# Patient Record
Sex: Female | Born: 1960 | Race: White | Hispanic: No | Marital: Single
Health system: Northeastern US, Academic
[De-identification: ages and names within clinical notes are randomized; demographics above are authoritative.]

## PROBLEM LIST (undated history)

## (undated) ENCOUNTER — Encounter

---

## 2008-01-02 ENCOUNTER — Observation Stay
Admission: RE | Admit: 2008-01-02 | Discharge: 2008-01-07 | Disposition: A | Discharge status: Home / Self Care | Payer: No Typology Code available for payment source

## 2011-02-06 ENCOUNTER — Ambulatory Visit: Admit: 2011-02-06 | Payer: 59

## 2013-04-03 HISTORY — PX: HAND SURGERY: SHX662

## 2015-08-05 ENCOUNTER — Ambulatory Visit

## 2015-08-05 ENCOUNTER — Ambulatory Visit: Admitting: Internal Medicine

## 2015-08-05 ENCOUNTER — Ambulatory Visit: Admitting: Orthopaedic Surgery

## 2015-08-05 LAB — HX CHEM-LIPIDS
HX CHOL-HDL RATIO: 4.4
HX CHOLESTEROL: 286 mg/dL — ABNORMAL HIGH (ref 110–199)
HX HIGH DENSITY LIPOPROTEIN CHOL (HDL): 65 mg/dL (ref 35–75)
HX HOURS FAST: 4 h
HX LDL: 185 mg/dL — ABNORMAL HIGH (ref 0–129)
HX TRIGLYCERIDES: 179 mg/dL (ref 40–250)

## 2015-08-05 LAB — HX CHEM-PANELS
HX ANION GAP: 11 (ref 5–18)
HX BLOOD UREA NITROGEN: 18 mg/dL (ref 6–24)
HX CHLORIDE (CL): 100 meq/L (ref 98–110)
HX CO2: 24 meq/L (ref 20–30)
HX CREATININE (CR): 1.04 mg/dL (ref 0.57–1.30)
HX GFR, AFRICAN AMERICAN: 71 mL/min/{1.73_m2} — ABNORMAL LOW
HX GFR, NON-AFRICAN AMERICAN: 62 mL/min/{1.73_m2} — ABNORMAL LOW
HX GLUCOSE: 377 mg/dL — ABNORMAL HIGH (ref 70–139)
HX POTASSIUM (K): 4.9 meq/L (ref 3.6–5.1)
HX SODIUM (NA): 135 meq/L (ref 135–145)

## 2015-08-05 LAB — HX CHEM-LFT
HX ALANINE AMINOTRANSFERASE (ALT/SGPT): 118 IU/L — ABNORMAL HIGH (ref 0–54)
HX ALKALINE PHOSPHATASE (ALK): 272 IU/L — ABNORMAL HIGH (ref 40–130)
HX ASPARTATE AMINOTRANFERASE (AST/SGOT): 47 IU/L — ABNORMAL HIGH (ref 10–42)
HX BILIRUBIN, TOTAL: 0.9 mg/dL (ref 0.2–1.1)
HX GAMMA GLUTAMYL TRANSFERASE (GGT): 1417 IU/L — ABNORMAL HIGH (ref 0–59)

## 2015-08-05 LAB — HX CHEM-OTHER
HX CALCIUM (CA): 10.2 mg/dL (ref 8.5–10.5)
HX MAGNESIUM: 2 mg/dL (ref 1.6–2.6)
HX PHOSPHORUS: 4.2 mg/dL (ref 2.7–4.5)

## 2015-08-05 LAB — HX POINT OF CARE
HX GLUCOSE-POCT: 405 mg/dL — AB (ref 70–139)
HX HGB A1C, POC: 11.4 % — ABNORMAL HIGH (ref 4.3–5.8)

## 2015-08-05 LAB — HX BF-CHEM/URINE
HX ALBUMIN RANDOM URINE: 0.5 mg/dL
HX ALBUMIN/CREATININE RATIO, URINE: 13 mg/g (ref 0–30)
HX CREATININE, RANDOM URINE: 39.91 mg/dL
HX MICROALBUMIN CALC: 0.01 mg/mg

## 2015-08-05 LAB — HX CHOLESTEROL
HX CHOLESTEROL: 286 mg/dL — ABNORMAL HIGH (ref 110–199)
HX HIGH DENSITY LIPOPROTEIN CHOL (HDL): 65 mg/dL (ref 35–75)
HX LDL: 185 mg/dL — ABNORMAL HIGH (ref 0–129)
HX TRIGLYCERIDES: 179 mg/dL (ref 40–250)

## 2015-08-05 LAB — HX DIABETES: HX ALBUMIN RANDOM URINE: 0.5 mg/dL

## 2015-08-05 NOTE — Progress Notes (Signed)
* * *        [name]**    --- ---    55 Y old Female, DOB: 12-Nov-1960, External MRN: [id number]    Account Number: [id number]    [address] [name], [place]    Home: [phone]    Guarantor: [name] Insurance: [place] [place] Payer ID: PAPER    PCP: Laddie Aquas, MD Referring: Shellia Carwin External Visit ID: [id number]    Appointment [facility]        * * *    08/05/2015   **Appointment Provider:** Ancil Boozer, MD **CHN#:** [phone]    --- ---      **Supervising Provider:** Baron Hamper, MD    ---        Current Medications    ---    Taking     * Flovent HFA 44 MCG/ACT Aerosol 2 puffs Twice a day    ---    * Ibuprofen     ---    * Levothyroxine Sodium 50 MCG Tablet 1 tablet Once a day    ---    * Nystatin 100000 unit/gm Cream 1 application to affected area Twice a day    ---    * Omeprazole 40 mg Capsule Delayed Release 1 capsule Once a day    ---    * PredniSONE 10 mg Tablet 1 tablet Once a day    ---    * Voltaren 1 % Gel 2 grams twice daily    ---    * Voltaren 1 % Gel 2 grams twice daily    ---    * Medication List reviewed and reconciled with the patient    ---      Past Medical History    ---       Scleroderma - CREST dx 2007.        ---    IgG4 deficiency s/p IVIG 2010 ----single infusion given preventively after  week of bilateral knee replacements at [facility] 2010.        ---    Fx left wrist in 1994.        ---    Blood clot at LUE in 2006 on a short course of Coumadin.        ---    Bilateral carpal tunnel syndrome s/p Lt carpal tunnel release and steroids  injection right--.        ---    Bone spur left foot.        ---    Scoliosis.        ---    Spinal stenosis s/p steroids injection.        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 Katrinka Blazing 2010--- never infected, but packed with antibiotics with  surgery;.        ---    Right rotator cuff repairs x 4, complicated by repeat tears, infection,  placement of anchor  material.        ---      Surgical History    ---      Right rotator cuff repair, with infected hardware that had to be removed  2006    ---    Repeat right shoulder surgery, also which became infected. 2007    ---    Left rotator cuff repair 2008    ---    bilateral knee replacements 2009    ---    Left  arthroscopic carpal tunnel release 01/2011    ---    ORIF Left 4th metatarsal bone    ---    Left Ulna shortening 1994    ---      Family History    ---      Mother: deceased 71 yrs, lung cancer, hyperthyroidism, diagnosed with Cancer    ---    Father: deceased 25s yrs, heart attack, diagnosed with Heart Disease    ---    3 brother(s) .    ---    Non-Contributory    ---    FH of arthritis and scleroderma    fa dec with MI    Mo dec age 84 with lung    brother with scleroderma / copd;.    ---      Social History    ---    Tobacco history: Never smoked.    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    Alcohol Yes, but rarely.   Nonsmoker.    Lives with longstanding boyfriend.    ---      Allergies    ---      N.K.D.A.    ---    [name]]      Hospitalization/Major Diagnostic Procedure    ---      as above    ---      Review of Systems    ---     _ORT_ :    Eyes No. Ear, Nose Throat No. Digestion, Stomach, Bowel No. Bladder Problems  No. Bleeding Problems No. Numbness/Tingling No. Anxiety/Depression No.  Fever/Chills/Fatigue No. Chest Pain/Tightness/Palpitations No. Skin Rash No.  Dental Problems No. Joint/Muscle Pain/Cramps Yes. Blackout/Fainting No. Other  No.    Postive joint/muscle pain/cramps. Thirteen system review otherwise negative  and noncontributory. Denies numbness, tingling, or problems with eyes, ears,  nose throat, digestion, stomach, bowel, bladder, bleeding, fever, chills,  fatigue, chest pain/tightness, palpitations, skin rash, dentition or  blackout/fainting.        Reason for Appointment    ---      1\. Followup of a left Achilles rupture, treated nonoperatively DOI: 09/2014    ---       History of Present Illness    ---     _GENERAL_ :    Moselle is doing well 11 months s/p left achilles rupture. She is working with  PT and has noted improved walking endurance and strength. She has been  gardening and is planning on playing frisbee this weekend.      Vital Signs    ---    Pain scale 0, Ht-in 62, Ht-cm 157.48.      Examination    ---     _GENERAL_ :    Well appearing female in NAD. Left ankle ROM 30 deg dorsiflexion to 40 deg  plantarflexion. 4+/5 plantarflexion strength. NVID    Unable to single leg heel raise on left.          Assessments    ---    1\. Rupture Achilles tendon, left, subsequent encounter - S86.012D (Primary)    ---      Treatment    ---       **1\. Rupture Achilles tendon, left, subsequent encounter**    Notes: The patient was seen and examined with Dr. Everardo Beals today. She is doing  very well. The clinical findings were reviewed. She was advised to continue  her PT treatment and strengthening. Follow up PRN.    ---  Follow Up    ---    PRN    **Appointment Provider:** Ancil Boozer, MD    Electronically signed by Baron Hamper , MD on 08/10/2015 at 12:20 PM EDT    Sign off status: Completed        * * *        [facility]    [address]    [place], [place] [address]    Tel: [phone]    Fax: [phone]              * * *          Patient: WAHNETA, DEROCHER DOB: [birth year] Progress Note: Ancil Boozer, MD  08/05/2015    ---    Note generated by eClinicalWorks EMR/PM Software ([email or web address])

## 2015-08-05 NOTE — Progress Notes (Signed)
.  Progress Notes  .  Patient: [name], [name]  Provider: Ancil Boozer MD  .  DOB: [birth year] Age: 55 Y Sex: Female  Supervising Provider:: Baron Hamper, MD  Date: 08/05/2015  .  PCP: Laddie Aquas  MD  Date: 08/05/2015  .  --------------------------------------------------------------------------------  .  REASON FOR APPOINTMENT  .  1. Followup of a left Achilles rupture, treated nonoperatively  DOI: 09/2014  .  HISTORY OF PRESENT ILLNESS  .  GENERAL:   Darthy is doing well 11 months s/p left achilles rupture. She is  working with PT and has noted improved walking endurance and  strength. She has been gardening and is planning on playing  frisbee this weekend.  .  CURRENT MEDICATIONS  .  Taking Flovent HFA 44 MCG/ACT Aerosol 2 puffs Twice a day  Taking Ibuprofen  Taking Levothyroxine Sodium 50 MCG Tablet 1 tablet Once a day  Taking Nystatin 100000 unit/gm Cream 1 application to affected  area Twice a day  Taking Omeprazole 40 mg Capsule Delayed Release 1 capsule Once a  day  Taking PredniSONE 10 mg Tablet 1 tablet Once a day  Taking Voltaren 1 % Gel 2 grams twice daily  Taking Voltaren 1 % Gel 2 grams twice daily  Medication List reviewed and reconciled with the patient  .  PAST MEDICAL HISTORY  .  Scleroderma - CREST dx 2007  IgG4 deficiency s/p IVIG 2010 ----single infusion given  preventively after week of bilateral knee replacements at [facility]  2010  Fx left wrist in 1994  Blood clot at LUE in 2006 on a short course of Coumadin  Bilateral carpal tunnel syndrome s/p Lt carpal tunnel release and  steroids injection right--  Bone spur left foot  Scoliosis  Spinal stenosis s/p steroids injection  s/p bilateral knee replacements for valgus /arthritic  complications, performed by Dr. Katrinka Blazing 2010--- never infected, but  packed with antibiotics with surgery;  Right rotator cuff repairs x 4, complicated by repeat tears,  infection, placement of anchor material  .  ALLERGIES  .  N.K.D.A.  .  SURGICAL HISTORY  .  Right  rotator cuff repair, with infected hardware that had to be  removed 2006  Repeat right shoulder surgery, also which became infected. 2007  Left rotator cuff repair 2008  bilateral knee replacements 2009  Left arthroscopic carpal tunnel release 01/2011  ORIF Left 4th metatarsal bone  Left Ulna shortening 1994  .  FAMILY HISTORY  .  Mother: deceased 69 yrs, lung cancer, hyperthyroidism, diagnosed  with Cancer  Father: deceased 42s yrs, heart attack, diagnosed with Heart  Disease  3 brother(s) .  Non-Contributory  FH of arthritis and sclerodermafa dec with MIMo dec age 66 with  lung brother with scleroderma / copd;.  .  SOCIAL HISTORY  .  .  Tobacco  history: Never smoked.  .  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  .  Alcohol  Yes, but rarely.  .  Nonsmoker.Lives with longstanding boyfriend.  [name]  HOSPITALIZATION/MAJOR DIAGNOSTIC PROCEDURE  .  as above  .  REVIEW OF SYSTEMS  .  ORT:  .  Eyes    No . Ear, Nose Throat    No . Digestion, Stomach, Bowel     No . Bladder Problems    No . Bleeding Problems    No .  Numbness/Tingling    No . Anxiety/Depression    No .  Fever/Chills/Fatigue    No . Chest Pain/Tightness/Palpitations  No . Skin Rash    No . Dental Problems    No . Joint/Muscle  Pain/Cramps    Yes . Blackout/Fainting    No . Other    No .  .  Postive joint/muscle pain/cramps. Thirteen system review  otherwise negative and noncontributory. Denies numbness,  tingling, or problems with eyes, ears, nose throat, digestion,  stomach, bowel, bladder, bleeding, fever, chills, fatigue, chest  pain/tightness, palpitations, skin rash, dentition or  blackout/fainting.  [name]  VITAL SIGNS  .  Pain scale 0, Ht-in 62, Ht-cm 157.48.  [name]  EXAMINATION  .  GENERAL: Well appearing female in NAD. Left ankle  ROM 30 deg dorsiflexion to 40 deg plantarflexion. 4+/5  plantarflexion strength. NVIDUnable to single leg heel raise on  left.  .  ASSESSMENTS  .  Rupture Achilles tendon, left, subsequent encounter -  S86.012D  (Primary)  .  TREATMENT  .  Rupture Achilles tendon, left, subsequent encounter  Notes: The patient was seen and examined with Dr. Everardo Beals today.  She is doing very well. The clinical findings were reviewed. She  was advised to continue her PT treatment and strengthening.  Follow up PRN.  .  FOLLOW UP  .  PRN  .  [name]  Appointment Provider: Ancil Boozer, MD  .  Electronically signed by Baron Hamper , MD on  08/10/2015 at 12:20 PM EDT  .  CONFIRMATORY SIGN OFF  .  [name]  Document electronically signed by Ancil Boozer MD  .

## 2015-08-05 NOTE — Progress Notes (Signed)
General Medicine Visit - Resident note with preceptor addendum  Service Due by Standard Protocol Rules: DIAB EYE EX, HGBA1C, PATPORTALPIN.    [name]  Initial Screening   * [place]: Martie Round Raynelle Fanning)  Ht: 62.5 in.  Wt: 216.0 lbs.   BMI: 39.02  Temp: 98.0 deg F.     BP (Initial Leonard Screening): 126 / 86      BP (Rechecked, Actionable): 126 /   86 mmHg   HR: 111     Healthy Eating materials? Weight Education Handout for high BMI  Travel outside of the [place] in past 28 days:: No  Chronic Pain Assessment: Does pt experience chronic pain ? YES  Severity of pain? (max=10) 2  Smoking Assessment: Tobacco use? never smoker  .  Self Management materials provided? Printed handout: Keeping You Healthy  .  Med List: PRINTED by Desha for patient   Med List: PRINTED byfor patient   Patient consents for eRx History:  Paper  .  Falls Risk Assessment:   In the past year, have you ... Had no falls  Difficulty with balance? NO  Need assistance with ambulation while here? NO  Behavioral Health Tools:   PHQ2 Screening Score 0  .  Comments: .......................................Dimas Alexandria  Aug 05, 2015   1:53 PM  .  .  .  .  **Resident Note**  .  * Preceptor: Rachell Cipro Vernona Rieger)  CC: abd pain  .  HPI: 55F here for follow up of multiple issues.  .  Left achilles ruptured 11 months ago- still has not healed. Has been seeing   Ortho. Has been doing PT, and feels happy with it. No pain. Able to walk,   squat, bend etc. Feels happy with her level of mobility and has adjusted to   her situation.  .  She has had a worsening cholestatic pattern on LFTs for the last 2 yrs. She   sees her Rheumatologist [name] for scleroderma and arthritis. He refer  red her to Dr. Althea Charon (next week 5/10) before starting therapy for sclero  derma in case of medication effect on LFTs. Today she reports to me about r  ight sided abd sharp pain that started 1 month ago. Cannot pinpoint locatio  n. Intermittent. Daily, multiple times. Not associated with eating, bowel m  ovements.  No triggers. No worsening or relieving factors. This had happened   several yrs ago for a week at which time it self resolved. No N/V, F/C. Ha  s never had RUQ U/S  .  She was started on atorvastatin last time. She stopped taking it after 2 mo  nths because it caused myalgias.  .  Diabetes: Has had pre-diabetes before. She has never been on anti medicatio  ns for it. POC A1c 11.4 today, elevated from last time which was 7. Fingers  tick 400. Denies polyuria, polydipsia. Has lost 6 lbs since 11/2014 (intenti  onal weight loss).  .  She has profuse sweating for the last 5 months. No exacerbating/relieving f  actors. LMP was 4 yrs ago, so she is post-menopausal. Has not had TSH check  ed in over a year.  [name]  Has had leg cramps intermittently in the past 6 months. Another physician r  ecommended that she get her electrolytes checked so she is requesting this.  .  .  Past Medical History:  see problem list  .  .  .  .  .  .  Review of Systems:  ROS otherwise negative except as noted in HPI  .  [name]  PROBLEMS:   PAST MEDICAL HISTORY (prior to today's visit):  FUNGAL INFECTION (ICD-117.9) (ICD10-B49)      FATTY LIVER DISEASE (ICD-571.8) (ICD10-K76.0)  ? 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)  CARPAL TUNNEL (ICD-354.0) (ICD10-G56.00)  OSTEOARTHRITIS (ICD-715.09) (ICD10-M15.9)  S/P ROTATOR CUFF SURGERY 12/2004 - RT SHOULDER; 2008 LT (ICD-V45.89)  Family Hx of MELANOMA, FAMILY HX  (ICD-V16.8) (ICD10-Z80.8)  FRACTURE OF OTHER SPEC SITE,  PATHOLOGIC - MULTIPLE (ICD-733.19)  ONYCHOMYCOSIS (ICD-110.1) (ICD10-B35.1)  CHOLECYSTECTOMY AND HERNIA REPAIR (ICD-V45.89)  VITAMIN D DEFICIENCY (ICD-268.9) (ICD10-E55.9)  ANNUAL EXAM (ICD-V72.31) (ICD10-Z00.00)  VACCINATION WITH TDAP (ICD-V06.8) (ZOX09-U04)  COLONOSCOPY (ICD-V76.51) (ICD10-Z01.89)  PROBLEM CHANGES:  Changed problem from DIABETES TYPE 2 - DX BY A1C DIET CONTROL (ICD-250.00)   (ICD10-E11.9) to DIABETES TYPE 2 - DX BY A1C (ICD-250.00) (ICD10-E11.9) - S  igned  Removed problem of ANNUAL EXAM (ICD-V72.31) (ICD10-Z00.00) - Signed  Removed problem of FUNGAL INFECTION (ICD-117.9) (ICD10-B49) - Signed  Changed problem from DIABETES TYPE 2 - DX BY A1C (ICD-250.00) (ICD10-E11.9)   to DIABETES MELLITUS, TYPE II, UNCONTROLLED (ICD-250.02) (ICD10-E11.65) -   Signed  Added new problem of OBESITY (ICD-278.00) (ICD10-E66.9) - Signed  Removed problem of SKIN INFECTION (ICD-686.9) (ICD10-L08.9) - Signed  Removed problem of ONYCHOMYCOSIS (ICD-110.1) (ICD10-B35.1) - Signed  Added new problem of TRANSAMINASES, SERUM, ELEVATED (ICD-790.4) (ICD10-R74.  0)  .  MEDICATIONS:   PAST MEDICINES (prior to today's visit):  LEVOTHYROXINE SODIUM 25 MCG TABS (LEVOTHYROXINE SODIUM) Take one tablet by   mouth once daily  OMEPRAZOLE 20 MG CPDR (OMEPRAZOLE) Take one capsule by mouth twice a day  ULTRAM 50 MG TABS (TRAMADOL HCL) take two  by mouth every 8 hours as needed   for pain  OXYCODONE HCL 5 MG TABS (OXYCODONE HCL) Please take one tablet orally every   8 hours as needed for severe pain  IBUPROFEN 600 MG TABS (IBUPROFEN) 1 tab by mouth TID as needed for pain  BACLOFEN 10 MG TABS (BACLOFEN) Please take half to one tablet as needed for   back spasms  ATORVASTATIN CALCIUM 20 MG TABS (ATORVASTATIN CALCIUM) Take one tablet by m  outh once daily  PROAIR HFA 108 (90 BASE) MCG/ACT AERS (ALBUTEROL SULFATE) Inhale 2 puffs 4   times a day as needed for wheezing  LOTRISONE 1-0.05 % CREA  (CLOTRIMAZOLE-BETAMETHASONE) apply to affected area   on ankle twice a day  .  MEDICINE CHANGES:  Removed medication of ATORVASTATIN CALCIUM 20 MG TABS (ATORVASTATIN CALCIUM  ) Take one tablet by mouth once daily - Signed  Added new medication of METFORMIN HCL 500 MG TABS (METFORMIN HCL) take one   tablet by mouth twice a day - Signed  Added new medication of FREESTYLE FREEDOM LITE W/DEVICE KIT (BLOOD GLUCOSE   MONITORING SUPPL) - Signed  Added new medication of FREESTYLE LITE TEST STRP (GLUCOSE BLOOD) - Signed  Added new medication of FREESTYLE LANCETS MISC (LANCETS) - Signed  Changed medication from FREESTYLE LITE TEST STRP (GLUCOSE BLOOD) to [facility]  LE LITE TEST STRP (GLUCOSE BLOOD) check fingerstick three times a day (ICD   10 E11.65) - Signed  Changed medication from FREESTYLE LANCETS MISC (LANCETS) to FREESTYLE LANCE  TS MISC (LANCETS) check fingerstick three times a day (ICD 10 E11.65) - Sig  ned  Changed medication from FREESTYLE FREEDOM LITE W/DEVICE KIT (BLOOD GLUCOSE   MONITORING SUPPL) to FREESTYLE FREEDOM LITE W/DEVICE KIT (BLOOD GLUCOSE MON  ITORING SUPPL) use as directed (ICD 10 E11.65) - Signed  Added new medication of PREDNISONE 10 MG TABS (PREDNISONE) TK 1 T PO  ONCE   Daily - Signed  Added new medication of DICLOFENAC SODIUM 1 % GEL (DICLOFENAC SODIUM) APP 2   GRAMS EXT AA BID - Signed  Removed medication of OXYCODONE HCL 5 MG TABS (OXYCODONE HCL) Please take o  ne tablet orally every 8 hours as needed for severe pain  Removed medication of ULTRAM 50 MG TABS (TRAMADOL HCL) take two  by mouth e  very 8 hours as needed for pain  Rx of METFORMIN HCL 500 MG TABS (METFORMIN HCL) take one tablet by mouth tw  ice a day;  #60[Tablet] x 12;  Signed;  Entered by: [name], MD (JR 5)  ;  Authorized by: [name], MD (JR 5);  Method used: Electronically to   [facility] [id number]*, [address] [place], [place]  [id number], Ph: [phone], Fax: [phone]  Rx of FREESTYLE LITE TEST STRP (GLUCOSE  BLOOD) check fingerstick three time  s a day (ICD 10 E11.65);  #90[Strip] x 3;  Signed;  Entered by: Jenne Pane  an, MD;  Authorized by: [name], MD (JR 5);  Method used: Electronical  ly to [facility] [id number]*, [address], [place], [place]  [address], Ph:   [phone], Fax: [phone]  Rx of FREESTYLE LANCETS MISC (LANCETS) check fingerstick three times a day   (ICD 10 E11.65);  #90[Lancet] x 3;  Signed;  Entered by: Canary Brim, MD;    Authorized by: [name], MD (JR 5);  Method used: Electronically to [facility] [id number]*, [address] [place], [place]  [id number], Ph: [phone]  [phone], Fax: [phone]  Rx of FREESTYLE FREEDOM LITE W/DEVICE KIT (BLOOD GLUCOSE MONITORING SUPPL)   use as directed (ICD 10 E11.65);  #1[Device] x 0;  Signed;  Entered by: Clydie Braun, MD;  Authorized by: [name], MD (JR 5);  Method used: Elec  tronically to [facility] [id number]*, [address], [place], [place]  [address], Ph: [phone], Fax: [phone]  .  ALLERGIES:   No Known Allergies  .  DIRECTIVES:   .  Vitals:   BP: 126 / 86 mmHg Ht: 62.5 in.  Wt: 216.0 lbs.  BMI (in-lb) 39.02  Temp: 98.0deg F.   Pulse Rate: 111 bpm  .  .  Additional PE: Gen: NAD, mild diaphoresis  Left ankle and foot: non tender. Warm, well perfused. Absence of tendon pal  pated over achilles area. Mild hyperdorsiflexion of left foot. Gait WNL.  Abd: soft, obese abdomen with redundant skin. NT on palpation. ND. She note  s pain is usually between RLQ and RUQ. No massess noted. No hepatosplenomeg  aly.  .  .  Assessment /T/ Plan:   46F here for follow up of multiple issues.  [name]  #Left achilles rupture: continue PT, light exercise  .  #Worsening LFTs, GGT, intermittent abdominal cramping:  -Will see Dr. Althea Charon on 5/10  -repeat LFTs,  GGT today  -Defer RUQ U/S or other imaging until she see Dr. Althea Charon  .  #Diabetes:  A1c 11.4 today, FS 405.  -Start metformin 500 BID  -Prescribed glucometer and test strips. She will bring these next week  to c  lin[facility] on 5/10 for diabetes teaching.   -urine microalbumin, urine ketones today  -lipid panel today  -check serum Cr, BUN, GFR  -Currently off statin due to side effect (atorvastatin --> myalgias). Will   not try another one right now given LFTs. But at next visit, will discuss t  rying another statin.   .  #Diaphoresis: has hx of hypothyroidism on stable levothyroxine .   -check TSH  reflex today, may need dose adjusted if overcorrected  .  #Leg cramps: check chem10  .  Needs colonoscopy in the fall  RTC on Wed 5/10 for full physical  .  .  .  * Resident Signature (.sign): .......................................[name], MD (JR 5)  Aug 05, 2015 4:14 PM  .  .  .  ORDERS:  POC A1C test (done in office) [phone]) [62130865]  RTC in 2 weeks [RTC-014]  LYTES [CPT-82495]  SGPT (ALT) [Q17426R,L001545]  Alkaline Phosphatase (ALK) [CPT-84075]  Bilirubin, Total [CPT-82247]  SGOT (AST) [Q19208W,L001123]  Hemoglobin A1C (Glycohemoglobin) [CPT-83036]  Glucose  [CPT-82947]  Magnesium [Q26013E,L001537]  Calcium (CA) [CPT-82310]  Phosphorus [Q718X,L001024]  TSH Reflex [CPT-00000]  Thyroid Stimulating Hormone (TSH) [CPT-84443]  T3 Total (T3) [CPT-84480]  Thyroxine (T4) [CPT-84436]  GGT [CPT-82977]  Lipid Profile-Chol, HDL, Trig, LDL(calc) [CPT-80061]  Creatinine (CR) [CPT-82565]  Blood Urea Nitrogen (BUN) [CPT-84520]  Microalbumin urine [Q6517X,L140285]  Urine w/microscope (81000) - done [78469629]  Urine dip - requested [CPT-99999]  Est Level 4 (1121/NP2292) [BMW-41324]  .  [name]  **Preceptor Note**  Resident: Marlane Hatcher ([place])  Problem Follow-up  .  [name]  Subjective   Patient is a 55 Years Old Female who presents to clinic f/u multiple medica  l problems   I discussed the patient with Dr. Marlane Hatcher [facility]) in a routine precepting enc  ounter.  I also personally interviewed and examined the patient.  I agree w  ith the patient's diagnosis and management.   .  .  Objective   see resident's note for details.    .  Assessment   55  y/o woman with PMH unexplained transaminitis and elevated ggt, inflammat  ory arthritis, CREST and diabetes who presents to f/u multiple medical prob  lems.  Main issue today is that she now has uncontrolled diabetes with HgbA  1c 11.4.  .  Plan   1. Diabetes:  -will start Metformin 500 mg 2x/day  -will come back next week for diabetes teaching with NP, sent glucometer an  d supplies to her pharmacy and told her to bring with her next week to appo  intment with NP  -will check lipids, GFR and urine microalbumin  -will hold off on restarting statin until patient sees Dr. Althea Charon and get  s her opinion  .  2. Transaminitis and elevated GGT with intermittent abdominal cramping:   -will recheck LFTs and ggt today  -has appt to see Dr. Althea Charon 08/11/15  .  RTC for annual PE 08/11/15.  .  ......................................Marland KitchenCanary Brim, MD  Aug 06, 2015 4:17   PM  See resident note for details, Follow-up per resident note, Return visit sc  heduled  .  .  .  .  Patient Care Plan  .  .  .  .  .  [name]  Vaccine Info Sheets ]  .  [name]  Electronically Signed by Canary Brim, MD on 08/06/2015 at 4:18 PM  ________________________________________________________________________

## 2015-08-06 LAB — HX CHEM-METABOLIC
HX HEMOGLOBIN A1C: 11 % — ABNORMAL HIGH (ref 4.3–5.8)
HX T3, TOTAL: 77 ng/dL (ref 58–159)
HX T4, TOTAL (THYROXINE): 5.8 ug/dL (ref 4.5–10.9)
HX THYROID STIMULATING HORMONE (TSH): 1.1 u[IU]/mL (ref 0.35–4.94)

## 2015-08-06 LAB — HX DIABETES
HX GLUCOSE: 377 mg/dL — ABNORMAL HIGH (ref 70–139)
HX HEMOGLOBIN A1C: 11 % — ABNORMAL HIGH (ref 4.3–5.8)

## 2015-08-07 ENCOUNTER — Ambulatory Visit

## 2015-08-07 NOTE — Progress Notes (Signed)
[facility] Aug 07, 2015  [address]   [place]  [place] [address]  Main: [phone]  Fax: [phone]  Patient Portal: [email or web address]              [name]  [address]  [place], [place]  [address]          MR#: [id number]          DOB: 03-Mar-1961    Dear  [name],    The results of the tests performed during your visit are as follows:  Cholesterol Tests   Cholesterol 286 Normal = 110-199 08/05/2015   HDL (good cholesterol) 65 Normal > 40 08/05/2015   Triglyceride 179 Normal = 40-250 08/05/2015   LDL (bad cholesterol) 185 Normal < 190  (If you are on a cholesterol medicine, the goal is to lower this by at least 50% from your baseline) 08/05/2015   Your risk of heart attack or stroke in the next 10 years is % If > 7.5%, then consider starting a type of medicine called "statin"     Calculate your own risk here:  http://tools.BuyDucts.dk   For more information, visit   http://tools.TransportationAnalyst.gl    Diabetes Tests   Glucose random 377 Normal = 60-150 08/05/2015   HgbA1c 11.0 Normal = 4.3-5.8 08/05/2015       Liver Tests   ALT 118 Normal = 0-54 08/05/2015   AST 47 Normal = 10-42 08/05/2015     Thyroid Test   TSH 1.10 Normal = 0.35-4.94 08/05/2015   T3 77 Normal = 58-159 08/05/2015   T4 5.8 Normal =4.5-10.9 08/05/2015       Kidney Tests   BUN 18 Normal = 6-24 08/05/2015   Creatinine 1.04 Normal = 0.57-1.30 08/05/2015   SODIUM = 135 MEQ/L     (08/05/2015)     (Normal = 135-145)  POTASSIUM = 4.9 MEQ/L     ( 08/05/2015)     (Normal = 3.6-5.1)  BICARB = 24 MEQ/L     (08/05/2015)     (Normal = 20-30)  CHLORIDE = 100 MEQ/L     (08/05/2015)     (Normal = 98-110)        Other Tests   Bilirubin, Total 0.9 Normal = 0.2-1.1 08/05/2015   Calcium 10.2 Normal =8.5-10.5 08/05/2015   GGT (H) 1417 Normal = 0-59 08/05/2015   ALK (H) 272 Normal =40-130 08/05/2015   Magnesium 2.0 Normal = 1.6-2.6 08/05/2015   Phosphorus 4.2 Normal = 2.7-4.5  08/05/2015     Microalbumin, Ur: 0.01(which means this was at a very low level in your urine. This is a good thing. In diabetes, the urine may show higher level of microalbumin, which is a sign of kidney damage from the diabetes).     As expected, your liver tests are still elevated. We will discuss this on Wednesday May 10th at your appointment.    Sincerely,       Glee Arvin, MD  [facility]  [phone]    ** Please be aware of our new extended hours to better care for you. Hours are: Monday through Thursday from 8 am to 8 pm; Friday from 8 am to 5 pm; Saturday from 8 am to 12 noon.        Created By Jamse Mead on 08/07/2015 at 07:52 PM    Electronically Signed By [name], MD on 08/09/2015 at 10:02 AM

## 2015-08-11 ENCOUNTER — Ambulatory Visit

## 2015-08-11 ENCOUNTER — Ambulatory Visit: Admitting: Internal Medicine

## 2015-08-11 NOTE — Progress Notes (Signed)
[facility] Aug 11, 2015  [address]   [place]  [place] [address]  Main: [phone]  Fax: [phone]  Patient Portal: [email or web address]            DIABETES HEALTH REPORT - Most Recent Results    [name]  Primary Care Doctor: [name], MD (JR 5)  Office phone number: [phone]    Test Result Date Normal Range   A1C   11.0 08/05/2015 Goal: < 7%.  Check 2-4 times per year   LDL Cholesterol   185 08/05/2015 Goal: < 100  Check 1 time per year   Urine Microalbumin   13 08/05/2015 Goal: < 0.02  Check 1 time per year   Eye Exam     Check 1 time per year   Foot Exam     Check 1 time per year   Blood Pressure   126 / 86 08/05/2015 Goal: < 140 / < 80  Check 2-4 times per year     MEDICATION LIST  Medications on 08/11/2015  PREDNISONE 10 MG TABS (PREDNISONE) TK 1 T PO  ONCE Daily  LEVOTHYROXINE SODIUM 25 MCG TABS (LEVOTHYROXINE SODIUM) Take one tablet by mouth once daily  OMEPRAZOLE 20 MG CPDR (OMEPRAZOLE) Take one capsule by mouth twice a day  IBUPROFEN 600 MG TABS (IBUPROFEN) 1 tab by mouth TID as needed for pain  BACLOFEN 10 MG TABS (BACLOFEN) Please take half to one tablet as needed for back spasms  DICLOFENAC SODIUM 1 % GEL (DICLOFENAC SODIUM) APP 2 GRAMS EXT AA BID  PROAIR HFA 108 (90 BASE) MCG/ACT AERS (ALBUTEROL SULFATE) Inhale 2 puffs 4 times a day as needed for wheezing  LOTRISONE 1-0.05 % CREA (CLOTRIMAZOLE-BETAMETHASONE) apply to affected area on ankle twice a day      FREESTYLE FREEDOM LITE W/DEVICE KIT (BLOOD GLUCOSE MONITORING SUPPL) use as directed (ICD 10 E11.65)      FREESTYLE LITE TEST STRP (GLUCOSE BLOOD) check fingerstick three times a day (ICD 10 E11.65)      FREESTYLE LANCETS MISC (LANCETS) check fingerstick three times a day (ICD 10 E11.65)  GLUCOPHAGE XR 500 MG XR24H-TAB (METFORMIN HCL) take four tablets by mouth daily every morning  ROSUVASTATIN CALCIUM 10 MG TABS (ROSUVASTATIN CALCIUM) take one tablet by mouth daily  INVOKANA 100 MG TABS  (CANAGLIFLOZIN) take one tablet by mouth daily    Allergies on 08/11/2015  NKA      Created By Olam Idler MSN,NP on 08/11/2015 at 04:03 PM    Electronically Signed By Olam Idler MSN,NP on 08/11/2015 at 04:03 PM

## 2015-08-11 NOTE — Progress Notes (Signed)
* * *        **  [name]**    --- ---    29 Y old Female, DOB: 1960-10-18    [address] [place], [place] [address]    Home: [phone]    Provider: Jolaine Artist        * * *    Telephone Encounter    ---    Answered by   Maurice March  Date: 08/11/2015         Time: 03:17 PM    Reason   FU    --- ---            Message                      Pt needs a 3 mons fu appt. She prefers  afternoon. Please call [phone].                Action Taken   Castillo,Angerly 08/11/2015 3:17:50 PM > Bastien,[name] 08/19/2015  4:12:34 PM > Scheduled Mon., 8/28 at 1:00. Left message.                * * *                ---          * * *          Patient: Cabezas, Maral DOB: 1960-07-06 Provider: Jolaine Artist  08/11/2015    ---    Note generated by eClinicalWorks EMR/PM Software ([email or web address])

## 2015-08-11 NOTE — Progress Notes (Signed)
.  Progress Notes  .  Patient: [name]  Provider: Jolaine Artist    .  DOB: 26-Nov-1960 Age: 55 Y Sex: Female  Supervising Provider:: Jolaine Artist, MD  Date: 08/11/2015  .  PCP: Laddie Aquas  MD  Date: 08/11/2015  .  --------------------------------------------------------------------------------  .  REASON FOR APPOINTMENT  .  1. NP/ ELEVATED LFT  .  HISTORY OF PRESENT ILLNESS  .  GENERAL:  [name] is a 55 yo F with a PMHx  scleroderma, morbid obesity, IgG-4 deficiency, GERD, DM2,  hypothyroidism and inflammatory arthritis who presents for an  initial consultation in Hepatology clinic for abnormal LFTs. She  reports feeling well today, denies any acute illnesses, jaundice,  nausea, emesis, melena, hematochezia, abdominal or lower  extremity swelling. Denies jaundice, confusion or lethargy. She  does report diaphoresis. For the past 10 years she has had mild  transaminase elevation, most recent labs (08/05/15): AST 47, ALT  118, AP 272 (GGT 1417), TB 0.9. Since 2015, labs notable for: Hgb  13.5, Plt 201, INR 1.0, AMA negative, ASMA negative, HBV sAg  non-reactive / e Ag non-reactive, c Ab non-reactive / DNA not  detected, HCV RNA not detected, Hepatitis A IgM negative. She has  never had a liver biopsy or seen a hepatologist before. She has  had persistent transaminase elevation dating back to 2007. She  had an abdominal ultrasound (2014) with a CBD 4-mm, post-CCY and  fatty liver. She has never had ascites or GI bleed.She denies  being told she has had any liver disease before, though she has  known about her elevated LFTs. No known family history of liver  disease or liver cancer. She was started on prednisone 10 mg PO  daily in February for inflammatory arthritis. [name] reports a  history of almost daily alcohol use in her 31s, but has consumed  rare EtOH for the past 20 years. Denies any oral supplement use.  [name]  CURRENT MEDICATIONS  .  Taking Flovent HFA 44 MCG/ACT Aerosol 2 puffs Twice a  day  Taking Ibuprofen  Taking Levothyroxine Sodium 50 MCG Tablet 1 tablet Once a day  Taking Nystatin 100000 unit/gm Cream 1 application to affected  area Twice a day  Taking Omeprazole 40 mg Capsule Delayed Release 1 capsule Once a  day  Taking PredniSONE 10 mg Tablet 1 tablet Once a day  Taking Voltaren 1 % Gel 2 grams twice daily  Taking Voltaren 1 % Gel 2 grams twice daily  Medication List reviewed and reconciled with the patient  .  PAST MEDICAL HISTORY  .  Scleroderma - CREST dx 2007  IgG4 deficiency s/p IVIG 2010 ----single infusion given  preventively after week of bilateral knee replacements at [facility]  2010  Fx left wrist in 1994  Blood clot at LUE in 2006 on a short course of Coumadin  Bilateral carpal tunnel syndrome s/p Lt carpal tunnel release and  steroids injection right--  Bone spur left foot  Scoliosis  Spinal stenosis s/p steroids injection  s/p bilateral knee replacements for valgus /arthritic  complications, performed by Dr. Katrinka Blazing 2010--- never infected, but  packed with antibiotics with surgery;  Right rotator cuff repairs x 4, complicated by repeat tears,  infection, placement of anchor material  Elevated liver function tests  .  ALLERGIES  .  N.K.D.A.  .  SURGICAL HISTORY  .  Right rotator cuff repair, with infected hardware that had to be  removed 2006  Repeat right  shoulder surgery, also which became infected. 2007  Left rotator cuff repair 2008  bilateral knee replacements 2009  Left arthroscopic carpal tunnel release 01/2011  ORIF Left 4th metatarsal bone  Left Ulna shortening 1994  .  FAMILY HISTORY  .  Mother: deceased 56 yrs, lung cancer, hyperthyroidism, diagnosed  with Cancer  Father: deceased 23s yrs, heart attack, diagnosed with Heart  Disease  3 brother(s) .  FH of arthritis and sclerodermaFather deceased from MIMother  deceased age 23 with lung cancer Brother with scleroderma / copd.  .  SOCIAL HISTORY  .  .  Tobacco  history: Never smoked.  [name]  Work/Occupation: Administrator at  fitness center.  .  .  Alcohol  Former daily EtOH use in 20s  .  Nonsmoker.Lives with longstanding boyfriend.  [name]  HOSPITALIZATION/MAJOR DIAGNOSTIC PROCEDURE  .  as above  .  REVIEW OF SYSTEMS  .  GI/Hepatology:  .  Constitutional    No recent weight change, fever, or fatigue .  Cardiovascular    No heart disease, chest pain, angina,  palpitations, shortness of breath, swelling of feet, ankles, or  hands . Neurological    No frequent headaches, lightheaded or  dizziness, seizures/convulsions, tremors, paralysis, stroke, head  injury, numbness . Respiratory    No chronic cough/phlegm  production, spitting up blood, shortness or breath, asthma or  wheezing . Hematologic/Lymphatic    Not slow to heal after cuts.  No bleeding or bruising easily. . Integumentary (Skin, Breast)     No rash or itching. . Genitourinary    No frequent urination,  burning or painful urination, blood in urine, incontinence. .  Musculoskeletal    (+) joint pain, chronic, unchanged. Denies  muscle aches or joint swelling .  [name]  VITAL SIGNS  .  Pain scale 2, Ht-in 62, Wt-lbs 212.2, BMI 38.81, BP 123/84, HR  90, Temp 97.5, BSA 2.05, O2 96, Ht-cm 157.48, Wt-kg 96.25, Wt  Change -5.8 lb.  .  PHYSICAL EXAMINATION  .  GENERAL:  General Appearance:  Well appearing in no apparent distress.  HEENT  Anicteric sclera. Moist mucus membranes.  Skin  Curved nails all five fingers on L/R hands bilaterally..  Cardiovascular  Regular rate and rhythm, no murmurs, rubs or  gallops.  Lungs  Comfortable respirations, clear to auscultation  bilaterally.  Abdomen  Soft, obese, non-tender, non-distended. Normoactive  bowel sounds. No hepatosplenomegaly. No rebound or guarding.  Extremities  No clubbing. No lower extremity edema.  .  ASSESSMENTS  .  Abnormal liver function tests - R79.89 (Primary)  .  [name] is a 55 yo F with a PMHx scleroderma, morbid  obesity, IgG-4 deficiency, GERD, DM2, hypothyroidism and  inflammatory arthritis who presents for initial  Hepatology  consultation for asymptomatic transaminase elevation. She has no  acute symptoms or concerns at today's visit. Over the past  10-years she has had persistent ALT / AST elevation, most recent  AST 47 / ALT 118, AP 200s (GGT 1400) with negative ANA / AMA /  ASMA and US demonstrating fatty liver. Viral hepatitis serologies  unremarkable. Differential includes NAFLD, autoimmune hepatitis,  AMA-negative PBC, Wilson's disease, alpha-1 antitrypsin.At  today's visit we counseled [name] on the importance of weight  loss through dietary and exercise for her overall liver health,  and that if she is successful this may coincide with an  improvement in her LFTs. Ceruloplasmin and alpha-1 antitrypsin  will be checked after today's visit (possible add-on from  last  weeks labs). Plan to repeat abdominal US. We will see her back in  3 months to review labs and ultrasound, can consider a liver  biopsy at this time.  .  TREATMENT  .  Abnormal liver function tests  LAB: Alpha 1 Antitrypsin  .  LAB: Ceruloplasmin  .  US Abdomen- Mid [id number] Abnormal LFTs, chronic. Please  evaluate Doppler flow.  Notes: 1. Please add on ceruloplasmin and alpha-1 anti-trypsin to  labs from last week. If unable to add on, please have drawn after  today's visit.2. Schedule abdominal ultrasound at earliest  convenience3. Due for repeat colonoscopy within the next 6 months  as inadequate preparation on colonoscopy in 2016 (Dr.  Odis Luster. Would discuss diaphoresis with PCP. Could consider  consultation with endocrinology for further evaluation.  .  .  Others  Notes: I, Dr. Jolaine Artist, have personally interviewed and  examined the patient and reviewed Dr. Alphonsa Overall note. I agree with  the history, exam, assessment and plan as detailed in the note in  the management of the patient's abnormal LFTs and fatty liver on  imaging.  .  FOLLOW UP  .  3 Months  .  Electronically signed by Jolaine Artist , MD on  08/13/2015 at 04:57 PM  EDT  .  CONFIRMATORY SIGN OFF  Johnney Killian , MD 08/11/2015 4:30:17 PM >   .  Document electronically signed by Jolaine Artist    .

## 2015-08-11 NOTE — Progress Notes (Signed)
* * *        [name]**    --- ---    55 Y old Female, DOB: Dec 01, 1960, External MRN: [id number]    Account Number: [id number]    [address] [place], [place]    Home: [phone]    Guarantor: [name] Insurance: [place] [place] Payer ID: PAPER    PCP: Laddie Aquas, MD Referring: Fortunato Curling, MD External Visit ID:  [id number]    [facility]oint[facility]        * * *    08/11/2015  Progress Notes: Jolaine Artist, MD **CHN#:** [phone] [phone]    --- ---    ---        Current Medications    ---    Taking     * Flovent HFA 44 MCG/ACT Aerosol 2 puffs Twice a day    ---    * Ibuprofen     ---    * Levothyroxine Sodium 50 MCG Tablet 1 tablet Once a day    ---    * Nystatin 100000 unit/gm Cream 1 application to affected area Twice a day    ---    * Omeprazole 40 mg Capsule Delayed Release 1 capsule Once a day    ---    * PredniSONE 10 mg Tablet 1 tablet Once a day    ---    * Voltaren 1 % Gel 2 grams twice daily    ---    * Voltaren 1 % Gel 2 grams twice daily    ---    * Medication List reviewed and reconciled with the patient    ---      Past Medical History    ---       Scleroderma - CREST dx 2007.        ---    IgG4 deficiency s/p IVIG 2010 ----single infusion given preventively after  week of bilateral knee replacements at [facility] 2010.        ---    Fx left wrist in 1994.        ---    Blood clot at LUE in 2006 on a short course of Coumadin.        ---    Bilateral carpal tunnel syndrome s/p Lt carpal tunnel release and steroids  injection right--.        ---    Bone spur left foot.        ---    Scoliosis.        ---    Spinal stenosis s/p steroids injection.        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 Katrinka Blazing 2010--- never infected, but packed with antibiotics with  surgery;.        ---    Right rotator cuff repairs x 4, complicated by repeat tears, infection,  placement of anchor material.        ---    Elevated liver function tests.        ---       Surgical History    ---      Right rotator cuff repair, with infected hardware that had to be removed  2006    ---    Repeat right shoulder surgery, also which became infected. 2007    ---    Left rotator cuff repair 2008    ---    bilateral knee replacements 2009    ---  Left arthroscopic carpal tunnel release 01/2011    ---    ORIF Left 4th metatarsal bone    ---    Left Ulna shortening 1994    ---      Family History    ---      Mother: deceased 20 yrs, lung cancer, hyperthyroidism, diagnosed with Cancer    ---    Father: deceased 3s yrs, heart attack, diagnosed with Heart Disease    ---    3 brother(s) .    ---    FH of arthritis and scleroderma    Father deceased from MI    Mother deceased age 5 with lung cancer    Brother with scleroderma / copd.    ---      Social History    ---    Tobacco history: Never smoked.    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    Alcohol    _Former daily EtOH use in 20s_   Nonsmoker.    Lives with longstanding boyfriend.    ---      Allergies    ---      N.K.D.A.    ---    [name]]      Hospitalization/Major Diagnostic Procedure    ---      as above    ---      Review of Systems    ---     _GI/Hepatology_ :    Constitutional No recent weight change, fever, or fatigue. Cardiovascular No  heart disease, chest pain, angina, palpitations, shortness of breath, swelling  of feet, ankles, or hands. Neurological No frequent headaches, lightheaded or  dizziness, seizures/convulsions, tremors, paralysis, stroke, head injury,  numbness. Respiratory No chronic cough/phlegm production, spitting up blood,  shortness or breath, asthma or wheezing. Hematologic/Lymphatic Not slow to  heal after cuts. No bleeding or bruising easily.. Integumentary (Skin, Breast)  No rash or itching.. Genitourinary No frequent urination, burning or painful  urination, blood in urine, incontinence.. Musculoskeletal (+) joint pain,  chronic, unchanged. Denies muscle aches or joint  swelling.            Reason for Appointment    ---      1\. NP/ ELEVATED LFT    ---      History of Present Illness    ---     _GENERAL_ :    [name] is a 55 yo F with a PMHx scleroderma, morbid obesity, IgG-4  deficiency, GERD, DM2, hypothyroidism and inflammatory arthritis who presents  for an initial consultation in Hepatology clinic for abnormal LFTs. She  reports feeling well today, denies any acute illnesses, jaundice, nausea,  emesis, melena, hematochezia, abdominal or lower extremity swelling. Denies  jaundice, confusion or lethargy. She does report diaphoresis. For the past 10  years she has had mild transaminase elevation, most recent labs (08/05/15): AST  47, ALT 118, AP 272 (GGT 1417), TB 0.9. Since 2015, labs notable for: Hgb  13.5, Plt 201, INR 1.0, AMA negative, ASMA negative, HBV sAg non-reactive / e  Ag non-reactive, c Ab non-reactive / DNA not detected, HCV RNA not detected,  Hepatitis A IgM negative. She has never had a liver biopsy or seen a  hepatologist before. She has had persistent transaminase elevation dating back  to 2007. She had an abdominal ultrasound (2014) with a CBD 4-mm, post-CCY and  fatty liver. She has never had ascites or GI bleed.    She denies being told she has had any  liver disease before, though she has  known about her elevated LFTs. No known family history of liver disease or  liver cancer. She was started on prednisone 10 mg PO daily in February for  inflammatory arthritis. [name] reports a history of almost daily alcohol  use in her 60s, but has consumed rare EtOH for the past 20 years. Denies any  oral supplement use.      Vital Signs    ---    Pain scale 2, Ht-in 62, Wt-lbs 212.2, BMI 38.81, BP 123/84, HR 90, Temp 97.5,  BSA 2.05, O2 96, Ht-cm 157.48, Wt-kg 96.25, Wt Change -5.8 lb.      Physical Examination    ---     _GENERAL_ :    General Appearance: Well appearing in no apparent distress.    HEENT Anicteric sclera. Moist mucus membranes.    Skin Curved  nails all five fingers on L/R hands bilaterally..    Cardiovascular Regular rate and rhythm, no murmurs, rubs or gallops.    Lungs Comfortable respirations, clear to auscultation bilaterally.    Abdomen Soft, obese, non-tender, non-distended. Normoactive bowel sounds. No  hepatosplenomegaly. No rebound or guarding.    Extremities No clubbing. No lower extremity edema.          Assessments    ---    1\. Abnormal liver function tests - R79.89 (Primary)    ---      [name] is a 55 yo F with a PMHx scleroderma, morbid obesity, IgG-4  deficiency, GERD, DM2, hypothyroidism and inflammatory arthritis who presents  for initial Hepatology consultation for asymptomatic transaminase elevation.  She has no acute symptoms or concerns at today's visit. Over the past 10-years  she has had persistent ALT / AST elevation, most recent AST 47 / ALT 118, AP  200s (GGT 1400) with negative ANA / AMA / ASMA and US demonstrating fatty  liver. Viral hepatitis serologies unremarkable. Differential includes NAFLD,  autoimmune hepatitis, AMA-negative PBC, Wilson's disease, alpha-1 antitrypsin.    At today's visit we counseled [name] on the importance of weight loss  through dietary and exercise for her overall liver health, and that if she is  successful this may coincide with an improvement in her LFTs. Ceruloplasmin  and alpha-1 antitrypsin will be checked after today's visit (possible add-on  from last weeks labs). Plan to repeat abdominal US. We will see her back in 3  months to review labs and ultrasound, can consider a liver biopsy at this  time.    ---      Treatment    ---       **1\. Abnormal liver function tests**    _LAB: Alpha 1 Antitrypsin_    _LAB: Ceruloplasmin_    _IMAGING: US Abdomen- Mid Abdomen_     Abnormal LFTs, chronic. Please evaluate  Doppler flow.    --- ---        Notes: 1. Please add on ceruloplasmin and alpha-1 anti-trypsin to labs from  last week. If unable to add on, please have drawn after today's  visit.    2\. Schedule abdominal ultrasound at earliest convenience    3\. Due for repeat colonoscopy within the next 6 months as inadequate  preparation on colonoscopy in 2016 (Dr. Rosalio Macadamia)    4\. Would discuss diaphoresis with PCP. Could consider consultation with  endocrinology for further evaluation.        **2\. Others**    Notes: I, Dr. Jolaine Artist, have personally interviewed  and examined the  patient and reviewed Dr. Alphonsa Overall note. I agree with the history, exam,  assessment and plan as detailed in the note in the management of the patient's  abnormal LFTs and fatty liver on imaging.      Follow Up    ---    3 Months    Electronically signed by Jolaine Artist , MD on 08/13/2015 at 04:57 PM EDT    Sign off status: Completed        * * *        [facility] Clinic    [address]    [place], [place] [address]    Tel: [phone]    Fax: [phone]              * * *          Patient: KIRSTEIN, BAXLEY DOB: July 05, 1960 Progress Note: Jolaine Artist, MD  08/11/2015    ---    Note generated by eClinicalWorks EMR/PM Software ([email or web address])

## 2015-08-11 NOTE — Progress Notes (Signed)
General Medicine Visit - Resident note with preceptor addendum  Service Due by Standard Protocol Rules: DIAB EYE EX, PATPORTALPIN.      Initial Screening   * [place]: [name] (Thelma)  Ht: 62.5 in.  Wt: 212.4 lbs.   BMI: 38.37  Temp: 97.5 deg F.     BP (Initial [place] Screening): 143 / 78      BP (Rechecked, Actionable): 143 / 78 mmHg   HR: 91     Chronic Pain Assessment: Does pt experience chronic pain ? YES  Severity of pain? (max=10) 2  Smoking Assessment: Tobacco use? never smoker    Self Management materials provided? Printed handout: Keeping You Healthy    Med List: PRINTED by [place] for patient   Med List: PRINTED by [place] for patient   Patient consents for eRx History:  Paper    Falls Risk Assessment:   In the past year, have you ... Had no falls  Difficulty with balance? NO  Need assistance with ambulation while here? NO  .....................................Marland KitchenMarland KitchenGordy Savers  Aug 11, 2015 3:35 PM      **Resident Note**    * Preceptor: Rencic Gabriel Rung)  CC: F/up Diabetes     HPI: 10F here for f/up of diabetes are transaminitis.    DM2:   After he new diagnosis of DM2 last week, she describes this as a wakeup call. She has since begun strict portion control and has been weighing her food. She has already lost 4 more pounds.     Transaminitis:  She just saw Dr Jolaine Artist in GI an hour ago. She has a RUQ U/S scheduled for 5/25. She may need a liver biopsy as well, but this will be decided later. She will get her blood drawn to check alpha 1 anti-trypsin and ceruloplasmin, per Dr. Iran Planas.                     Review of Systems: ROS otherwise negative except as noted in HPI      PROBLEMS:   PAST MEDICAL HISTORY (prior to today's visit):  TRANSAMINASES, SERUM, ELEVATED (ICD-790.4) (ICD10-R74.0)  OBESITY (ICD-278.00) (ICD10-E66.9)      DIABETES MELLITUS, TYPE II, UNCONTROLLED (ICD-250.02) (ICD10-E11.65)      FATTY LIVER DISEASE (ICD-571.8) (ICD10-K76.0)  ? 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)  CARPAL TUNNEL (ICD-354.0) (ICD10-G56.00)  OSTEOARTHRITIS (ICD-715.09) (ICD10-M15.9)  S/P ROTATOR CUFF SURGERY 12/2004 - RT SHOULDER; 2008 LT (ICD-V45.89)  Family Hx of MELANOMA, FAMILY HX (ICD-V16.8) (ICD10-Z80.8)  FRACTURE OF OTHER SPEC SITE,  PATHOLOGIC - MULTIPLE (ICD-733.19)  CHOLECYSTECTOMY AND HERNIA REPAIR (ICD-V45.89)  VITAMIN D DEFICIENCY (ICD-268.9) (ICD10-E55.9)  VACCINATION WITH TDAP (ICD-V06.8) (JXB14-N82)  COLONOSCOPY (ICD-V76.51) (ICD10-Z01.89)       MEDICATIONS:   PAST MEDICINES (prior to today's visit):  METFORMIN HCL 500 MG TABS (METFORMIN HCL) take one tablet by mouth twice a day  PREDNISONE 10 MG TABS (PREDNISONE) TK 1 T PO  ONCE Daily  LEVOTHYROXINE SODIUM 25 MCG TABS (LEVOTHYROXINE SODIUM) Take one tablet by mouth once daily  OMEPRAZOLE 20 MG CPDR (OMEPRAZOLE) Take one capsule by mouth twice a day  IBUPROFEN 600 MG TABS (IBUPROFEN) 1 tab by  mouth TID as needed for pain  BACLOFEN 10 MG TABS (BACLOFEN) Please take half to one tablet as needed for back spasms  DICLOFENAC SODIUM 1 % GEL (DICLOFENAC SODIUM) APP 2 GRAMS EXT AA BID  PROAIR HFA 108 (90 BASE) MCG/ACT AERS (ALBUTEROL SULFATE) Inhale 2 puffs 4 times a day as needed for wheezing  LOTRISONE 1-0.05 % CREA (CLOTRIMAZOLE-BETAMETHASONE) apply to affected area on ankle twice a day      FREESTYLE FREEDOM LITE W/DEVICE KIT (BLOOD GLUCOSE MONITORING SUPPL) use as directed (ICD 10 E11.65)      FREESTYLE LITE TEST STRP (GLUCOSE BLOOD) check fingerstick three times a day (ICD 10 E11.65)      FREESTYLE LANCETS MISC (LANCETS) check fingerstick  three times a day (ICD 10 E11.65)    MEDICINE CHANGES:  Removed medication of METFORMIN HCL 500 MG TABS (METFORMIN HCL) take one tablet by mouth twice a day - Signed  Added new medication of GLUCOPHAGE XR 500 MG XR24H-TAB (METFORMIN HCL) take four tablets by mouth daily every morning - Signed  Added new medication of ROSUVASTATIN CALCIUM 10 MG TABS (ROSUVASTATIN CALCIUM) take one tablet by mouth daily - Signed  Added new medication of INVOKANA 100 MG TABS (CANAGLIFLOZIN) take one tablet by mouth daily - Signed  Rx of GLUCOPHAGE XR 500 MG XR24H-TAB (METFORMIN HCL) take four tablets by mouth daily every morning;  #360 x 4;  Signed;  Entered by: [name], MD (JR 5);  Authorized by: [name], MD (JR 5);  Method used: Electronically to [facility] [id number]*, [address], [place], [place]  [id number], Ph: [phone], Fax: [phone]  Rx of ROSUVASTATIN CALCIUM 10 MG TABS (ROSUVASTATIN CALCIUM) take one tablet by mouth daily;  #90 x 4;  Signed;  Entered by: [name], MD (JR 5);  Authorized by: [name], MD (JR 5);  Method used: Electronically to [facility] [id number]*, [address], [place], [place]  [id number], Ph: [phone], Fax: [phone]  Rx of INVOKANA 100 MG TABS (CANAGLIFLOZIN) take one tablet by mouth daily;  #90 x 4;  Signed;  Entered by: [name], MD (JR 5);  Authorized by: [name], MD (JR 5);  Method used: Electronically to [facility] [id number]*, [address] [place], [place]  [id number], Ph: [phone], Fax: [phone]       ALLERGIES:   No Known Allergies    DIRECTIVES:        Vitals:   BP: 143 / 78 mmHg Ht: 62.5 in.  Wt: 212.4 lbs.  BMI (in-lb) 38.37  Temp: 97.5deg F.   Pulse Rate: 91 bpm      Additional PE: Gen: NAD  Otherwise no exam required      Assessment & Plan:   22F here for f/up of diabetes are transaminitis.    DM2:  -Increase metformin to 2000 daily  -Start canaglifozin 100mg  daily (if no coverage, will need to use glipizide instead)  -Insulin: We discussed trying intense  insulin therapy for a few months to get her A1c under better control and then possibly being able to discontinue it and continue on oral agents only. However, she would like to try the 2 above oral agents first along with a change in her diet, and will discuss trying insulin when she comes to see me in June.     #HTN: At next appt, will discuss starting ACE-i.  #HLD: start crestor 10mg  daily    Transaminitis:  -F/up with Dr Iran Planas in 3 months  -F/up RUQ U/S  5/25  -consider liver biopsy  -f/up alpha 1 anti trypsin and ceruloplasmin    RTC in June        * Resident Signature (.sign): .......................................[name], MD (JR 5)  Aug 13, 2015 1:50 PM        ORDERS:  RTC in 4 weeks [RTC-028]  CQM-Colonoscopy [[id number]]  Est Level 3 [phone]) [id number]      **Preceptor Note**     Resident: [name])  Problem Follow-up      Subjective   Patient is a 55 Years Old Female who presents to clinic uncontrolled diabetes  no polyuria, polydipsia    I discussed the patient with Dr. Marlane Hatcher [facility] in a routine precepting encounter.  I agree with the patient's diagnosis and management.        Objective     Assessment   diabetes: increase metformin to 2000 once daily; add invokana, return to clinic in 4 weeks for followup     Plan   See resident note for details, Follow-up per resident note          Patient Care Plan              Vaccine Info Sheets ]      Created By Karn Cassis on 08/11/2015 at 03:34 PM    Electronically Signed By [name], MD on 08/13/2015 at 02:34 PM

## 2015-08-11 NOTE — Progress Notes (Signed)
General Medicine Visit - Resident note with preceptor addendum  Service Due by Standard Protocol Rules: DIAB EYE EX, PATPORTALPIN.    [name]  Initial Screening   * [place]: [name] ([name])  Ht: 62.5 in.  Wt: 212.4 lbs.   BMI: 38.37  Temp: 97.5 deg F.     BP (Initial [place] Screening): 143 / 78      BP (Rechecked, Actionable): 143 /   78 mmHg   HR: 91     Chronic Pain Assessment: Does pt experience chronic pain ? YES  Severity of pain? (max=10) 2  Smoking Assessment: Tobacco use? never smoker  .  Self Management materials provided? Printed handout: Keeping You Healthy  .  Med List: PRINTED by [place] for patient   Med List: PRINTED by [place] for patient   Patient consents for eRx History:  Paper  .  Falls Risk Assessment:   In the past year, have you ... Had no falls  Difficulty with balance? NO  Need assistance with ambulation while here? NO  .....................................Marland KitchenMarland KitchenGordy [name]  Aug 11, 2015 3:35   PM  .  .  **Resident Note**  .  * Preceptor: Rencic (Joe)  CC: F/up Diabetes   .  HPI: 55F here for f/up of diabetes are transaminitis.  .  DM2:   After he new diagnosis of DM2 last week, she describes this as a wakeup cal  l. She has since begun strict portion control and has been weighing her foo  d. She has already lost 4 more pounds.   .  Transaminitis:  She just saw Dr Jolaine Artist in GI an hour ago. She has a RUQ U/S sche  duled for 5/25. She may need a liver biopsy as well, but this will be decid  ed later. She will get her blood drawn to check alpha 1 anti-trypsin and ce  ruloplasmin, per Dr. Iran Planas.   .  .  .  .  .  .  .  .  .  Review of Systems: ROS otherwise negative except as noted in HPI  .  [name]  PROBLEMS:   PAST MEDICAL HISTORY (prior to today's visit):  TRANSAMINASES, SERUM, ELEVATED (ICD-790.4) (ICD10-R74.0)  OBESITY (ICD-278.00) (ICD10-E66.9)      DIABETES MELLITUS, TYPE II, UNCONTROLLED (ICD-250.02) (ICD10-E11.65)      FATTY LIVER DISEASE (ICD-571.8) (ICD10-K76.0)  ? 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  MEDICATIONS:   PAST MEDICINES (prior to today's visit):  METFORMIN HCL 500 MG TABS (METFORMIN HCL) take one tablet by mouth twice a   day  PREDNISONE 10 MG TABS (PREDNISONE) TK 1 T PO  ONCE Daily  LEVOTHYROXINE SODIUM 25 MCG TABS (LEVOTHYROXINE SODIUM) Take one tablet by   mouth once daily  OMEPRAZOLE 20 MG CPDR (OMEPRAZOLE)  Take one capsule by mouth twice a day  IBUPROFEN 600 MG TABS (IBUPROFEN) 1 tab by mouth TID as needed for pain  BACLOFEN 10 MG TABS (BACLOFEN) Please take half to one tablet as needed for   back spasms  DICLOFENAC SODIUM 1 % GEL (DICLOFENAC SODIUM) APP 2 GRAMS EXT AA BID  PROAIR HFA 108 (90 BASE) MCG/ACT AERS (ALBUTEROL SULFATE) Inhale 2 puffs 4   times a day as needed for wheezing  LOTRISONE 1-0.05 % CREA (CLOTRIMAZOLE-BETAMETHASONE) apply to affected area   on ankle twice a day      FREESTYLE FREEDOM LITE W/DEVICE KIT (BLOOD GLUCOSE MONITORING SUPPL) [place]  e as directed (ICD 10 E11.65)      FREESTYLE LITE TEST STRP (GLUCOSE BLOOD) check fingerstick three times   a day (ICD 10 E11.65)       FREESTYLE LANCETS MISC (LANCETS) check fingerstick three times a day (I  CD 10 E11.65)  .  MEDICINE CHANGES:  Removed medication of METFORMIN HCL 500 MG TABS (METFORMIN HCL) take one ta  blet by mouth twice a day - Signed  Added new medication of GLUCOPHAGE XR 500 MG XR24H-TAB (METFORMIN HCL) take   four tablets by mouth daily every morning - Signed  Added new medication of ROSUVASTATIN CALCIUM 10 MG TABS (ROSUVASTATIN CALCI  UM) take one tablet by mouth daily - Signed  Added new medication of INVOKANA 100 MG TABS (CANAGLIFLOZIN) take one table  t by mouth daily - Signed  Rx of GLUCOPHAGE XR 500 MG XR24H-TAB (METFORMIN HCL) take four tablets by m  outh daily every morning;  #360 x 4;  Signed;  Entered by: [name], MD   (JR 5);  Authorized by: [name], MD (JR 5);  Method used: Donnelly Stager to [facility] [id number]*, [address], [place], [place]  [id number], Ph  : [phone], Fax: [phone]  Rx of ROSUVASTATIN CALCIUM 10 MG TABS (ROSUVASTATIN CALCIUM) take one table  t by mouth daily;  #90 x 4;  Signed;  Entered by: [name], MD (JR 5);    Authorized by: [name], MD (JR 5);  Method used: Electronically to [facility] [address]*, [address] [place], [place]  [id number], Ph: [phone], Fax: [phone]  Rx of INVOKANA 100 MG TABS (CANAGLIFLOZIN) take one tablet by mouth daily;    #90 x 4;  Signed;  Entered by: [name], MD (JR 5);  Authorized by: Secretary  Linus Mako, MD (JR 5);  Method used: Electronically to [facility]   [phone]*, [address], [place], [place]  [id number], Ph: [phone], Fax: [phone]  .  ALLERGIES:   No Known Allergies  .  DIRECTIVES:   .  Vitals:   BP: 143 / 78 mmHg Ht: 62.5 in.  Wt: 212.4 lbs.  BMI (in-lb) 38.37  Temp: 97.5deg F.   Pulse Rate: 91 bpm  .  .  Additional PE: Gen: NAD  Otherwise no exam required  .  [name]  Assessment /T/ Plan:   55F here for f/up of diabetes are transaminitis.  .  DM2:  -Increase metformin to 2000 daily  -Start canaglifozin 100mg  daily (if  no coverage, will need to use glipizide   instead)  -Insulin: We discussed trying intense insulin therapy for a few months to g  et her A1c under better control and then possibly being able to discontinue   it and continue on oral agents only. However, she would like to  try the 2   above oral agents first along with a change in her diet, and will discuss t  rying insulin when she comes to see me in June.   [name]  #HTN: At next appt, will discuss starting ACE-i.  #HLD: start crestor 10mg  daily  .  Transaminitis:  -F/up with Dr Iran Planas in 3 months  -F/up RUQ U/S 5/25  -consider liver biopsy  -f/up alpha 1 anti trypsin and ceruloplasmin  .  RTC in June  .  .  .  * Resident Signature (.sign): .......................................[name], MD (JR 5)  Aug 13, 2015 1:50 PM  .  .  .  ORDERS:  RTC in 4 weeks [RTC-028]  CQM-Colonoscopy [[id number]]  Est Level 3 (9811/BJ4782) [[id number]]  .  [name]  **Preceptor Note**  Resident: Marlane Hatcher ([place])  Problem Follow-up  .  [name]  Subjective   Patient is a 55 Years Old Female who presents to clinic uncontrolled diabet  es  no polyuria, polydipsia    I discussed the patient with Dr. Marlane Hatcher [facility]) in a routine precepting enc  ounter.  I agree with the patient's diagnosis and management.    .  .  Objective   .  Assessment   diabetes: increase metformin to 2000 once daily; add invokana, return to cl  inic in 4 weeks for followup   .  Plan   See resident note for details, Follow-up per resident note  .  .  .  .  Patient Care Plan  .  .  .  .  .  .  Vaccine Info Sheets ]  .  [name]  Electronically Signed by [name], MD on 08/13/2015 at 2:34 PM  ________________________________________________________________________

## 2015-08-12 ENCOUNTER — Ambulatory Visit

## 2015-08-12 NOTE — Progress Notes (Signed)
[facility] Aug 12, 2015  [address]   [place]  [place] [address]  Main: [phone]  Fax: [phone]  Patient Portal: [email or web address]            Booking Form: Endoscopy Suite    Date of request: 08/12/2015   Special MD request:      Patient Information:    [name]  [address]  [place], [place]  [address]   Date of Birth: [birth year]  MR#: [id number]  Telephone: [phone]     Additional Information:    Interpreter needed? Yes  No   What language?        Special time requests:        SBE prophylaxis? Yes x No   Instructions:    Stop anticoagulation? Yes x Nox   Instructions:    Conscious sedation ok? Yes  No   If no, why not: x     Requester Information:   Requested by: Everlene Farrier  Phone: [phone]   PCP name: [name], MD (JR 5)   Email address for reply:          Insurance Information:   Insurance: [id number] [employer]P [facility] PCP   Policy number: [id number]   Guarantor:    Relation to pt:    Referral number:        Confirmation of Appointment:    Date/Time of procedure: 10/25/15 at 9AM   Time pt should arrive: 8:00AM   Patient notified? x Yes  No    x By phone  By mail         PROCEDURE INFORMATION   Procedure Reason Desired Prep    Flex Sig   Screening   Fleets enema      Diarrhea  Mag Citrate      Anal Fissure        IBS             x Colonoscopy  x Screening  Golytely      Diarrhea  Fleets Phospho Soda      Constipation  (not renal/cardiac pt)      r/o IBD        Guaiac pos stool        Anemia        Rectal bleed        H/o Polyps              EGD    Abd pain        Dysphagia        Chronic heartburn        N/V        Anemia        Guaiac pos stool        Abn chest pain        Abn UGI series              Other          Medical Information:   PMH:  TRANSAMINASES, SERUM, ELEVATED (ICD-790.4) (ICD10-R74.0)  OBESITY (ICD-278.00) (ICD10-E66.9)      DIABETES MELLITUS, TYPE II, UNCONTROLLED (ICD-250.02) (ICD10-E11.65)      FATTY LIVER DISEASE (ICD-571.8) (ICD10-K76.0)  ? 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)  CARPAL TUNNEL (ICD-354.0) (ICD10-G56.00)  OSTEOARTHRITIS (ICD-715.09) (ICD10-M15.9)  S/P ROTATOR CUFF SURGERY 12/2004 - RT SHOULDER; 2008 LT (ICD-V45.89)  Family Hx of MELANOMA, FAMILY HX (ICD-V16.8) (ICD10-Z80.8)  FRACTURE OF OTHER SPEC SITE,  PATHOLOGIC - MULTIPLE (ICD-733.19)  CHOLECYSTECTOMY AND HERNIA REPAIR (ICD-V45.89)  VITAMIN D DEFICIENCY (ICD-268.9) (ICD10-E55.9)  VACCINATION WITH TDAP (ICD-V06.8) (ZOX09-U04)  COLONOSCOPY (ICD-V76.51) (ICD10-Z01.89)     Meds:   PREDNISONE 10 MG TABS (PREDNISONE) TK 1 T PO  ONCE Daily  LEVOTHYROXINE SODIUM 25 MCG TABS (LEVOTHYROXINE SODIUM) Take one tablet by mouth once daily  OMEPRAZOLE 20 MG CPDR (OMEPRAZOLE) Take one capsule by mouth twice a day  IBUPROFEN 600 MG TABS (IBUPROFEN) 1 tab by mouth TID as needed for pain  BACLOFEN 10 MG TABS (BACLOFEN) Please take half to one tablet as needed for back spasms  DICLOFENAC SODIUM 1 % GEL (DICLOFENAC SODIUM) APP 2 GRAMS EXT AA BID  PROAIR HFA 108 (90 BASE) MCG/ACT AERS (ALBUTEROL SULFATE) Inhale 2 puffs 4 times a day as needed for wheezing  LOTRISONE 1-0.05 % CREA (CLOTRIMAZOLE-BETAMETHASONE) apply to affected area on ankle twice a day      FREESTYLE FREEDOM LITE W/DEVICE KIT (BLOOD GLUCOSE MONITORING SUPPL) use as directed (ICD 10 E11.65)      FREESTYLE LITE TEST STRP (GLUCOSE BLOOD) check fingerstick three times a day (ICD 10 E11.65)      FREESTYLE LANCETS MISC (LANCETS) check fingerstick three times a day (ICD 10 E11.65)  GLUCOPHAGE XR 500 MG XR24H-TAB  (METFORMIN HCL) take four tablets by mouth daily every morning  ROSUVASTATIN CALCIUM 10 MG TABS (ROSUVASTATIN CALCIUM) take one tablet by mouth daily  INVOKANA 100 MG TABS (CANAGLIFLOZIN) take one tablet by mouth daily     Allergies:    NKA   Bloodwork:    PLATELET = 256 (06/24/2012 3:50:00 PM)  INR = 1.0 (01/02/2014 2:13:00 PM)                                       Created By Everlene Farrier on 08/12/2015 at 04:08 PM    Electronically Signed By Armando Reichert on 08/19/2015 at 10:27 AM

## 2015-08-13 ENCOUNTER — Ambulatory Visit

## 2015-08-13 NOTE — Progress Notes (Signed)
Division of General Medicine - Additional Orders      Orders     Colonoscopy [[id number]]        Created By [name], MD on 08/13/2015 at 01:36 PM    Electronically Signed By [name], MD on 08/13/2015 at 01:37 PM

## 2015-08-14 LAB — HX IMMUNOLOGY
HX ALPHA 1 ANTITRYPSIN: 161 mg/dL
HX CERULOPLASMIN: 30 mg/dL

## 2015-08-16 ENCOUNTER — Ambulatory Visit

## 2015-08-19 ENCOUNTER — Ambulatory Visit

## 2015-08-19 NOTE — Telephone Encounter (Signed)
PHONE NOTE     CALL INFORMATION:   Reason for call: Other  Person Calling: Kewana Sanon  Relationship to pt: Pt  PCP: [facility]  Time/Date of call: 858am 08/19/15  Day Phone #: [phone]      CALL DETAILS:   Pt states the two script are too expensive. Couldn't remeber the names  Call Taken by: ......................................Marland KitchenRanae Palms  Aug 19, 2015 8:59 AM        Prescriptions:  INVOKANA 100 MG TABS (CANAGLIFLOZIN) DISCONTINUE  #1 x 0   Entered by: [name], MD   Authorized by: [name], MD (JR 5)   Signed by: [name], MD on 08/19/2015   Method used: Electronically to      [id number]* (retail)     [address]     [place], [place]  [address]     Ph: [phone]     Fax: [phone]   RxID: [id number]  GLIPIZIDE XL 5 MG TB24 (GLIPIZIDE) Take one tablet by mouth once daily  #90 x 3   Entered by: [name], MD   Authorized by: [name], MD (JR 5)   Signed by: [name], MD on 08/19/2015   Method used: Electronically to      [facility]* (retail)     [address]     [place], [place]  [address]     Ph: [phone]     Fax: [phone]   RxID: [id number]      RESPONSE/ORDERS:  CHANGED TO GLIPIZIDE, DC'ED INVOKANA.     Larita Fife, can you let her know? Thanks, Joe  .......................................[name], MD  Aug 19, 2015 11:36 AM    [facility] for pt.......................................Marland KitchenClayborne Artist, RN  Aug 19, 2015 11:40 AM               ORDERS/PROBS/MEDS/ALL     Problems:   TRANSAMINASES, SERUM, ELEVATED (ICD-790.4) (ICD10-R74.0)  OBESITY (ICD-278.00) (ICD10-E66.9)      DIABETES MELLITUS, TYPE II, UNCONTROLLED (ICD-250.02) (ICD10-E11.65)      FATTY LIVER DISEASE (ICD-571.8) (ICD10-K76.0)  ? 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)  CARPAL TUNNEL (ICD-354.0) (ICD10-G56.00)  OSTEOARTHRITIS (ICD-715.09) (ICD10-M15.9)  S/P ROTATOR CUFF SURGERY 12/2004 - RT SHOULDER; 2008 LT (ICD-V45.89)  Family Hx of MELANOMA, FAMILY HX (ICD-V16.8) (ICD10-Z80.8)  FRACTURE OF OTHER SPEC SITE,  PATHOLOGIC - MULTIPLE (ICD-733.19)  CHOLECYSTECTOMY AND HERNIA REPAIR (ICD-V45.89)  VITAMIN D DEFICIENCY (ICD-268.9) (ICD10-E55.9)  VACCINATION WITH TDAP (ICD-V06.8) (KYH06-C37)  COLONOSCOPY (ICD-V76.51) (ICD10-Z01.89)    Meds (prior to this call):   PREDNISONE 10 MG TABS (PREDNISONE) TK 1 T PO  ONCE Daily  LEVOTHYROXINE SODIUM 25 MCG TABS (LEVOTHYROXINE SODIUM) Take one tablet by mouth once daily  OMEPRAZOLE 20 MG CPDR (OMEPRAZOLE) Take one capsule by mouth twice a day  IBUPROFEN 600 MG TABS (IBUPROFEN) 1 tab by mouth TID as needed for pain  BACLOFEN 10 MG TABS (BACLOFEN) Please take half to one tablet as needed for back spasms  DICLOFENAC SODIUM 1 % GEL (DICLOFENAC SODIUM) APP 2 GRAMS EXT AA BID  PROAIR HFA 108 (90 BASE) MCG/ACT AERS (ALBUTEROL SULFATE) Inhale 2 puffs 4 times a day as needed for wheezing  LOTRISONE 1-0.05 % CREA (CLOTRIMAZOLE-BETAMETHASONE) apply to affected area on ankle twice a day      FREESTYLE FREEDOM LITE W/DEVICE KIT (BLOOD GLUCOSE MONITORING SUPPL) use as directed (ICD 10 E11.65)      FREESTYLE LITE TEST STRP (GLUCOSE BLOOD) check fingerstick three times a day (ICD 10 E11.65)      FREESTYLE LANCETS MISC (LANCETS) check fingerstick three times a day (ICD 10 E11.65)  GLUCOPHAGE XR 500 MG XR24H-TAB (METFORMIN HCL) take four tablets by mouth daily every morning  ROSUVASTATIN CALCIUM 10 MG TABS (ROSUVASTATIN CALCIUM) take one tablet by mouth daily  INVOKANA 100 MG TABS (CANAGLIFLOZIN) take one tablet by mouth daily    Changes to Meds (this  update):   Changed medication from INVOKANA 100 MG TABS (CANAGLIFLOZIN) take one tablet by mouth daily to GLIPIZIDE XL 5 MG TB24 (GLIPIZIDE) Take one tablet by mouth once daily - Signed  Added new medication of INVOKANA 100 MG TABS (CANAGLIFLOZIN) DISCONTINUE - Signed  Rx of GLIPIZIDE XL 5 MG TB24 (GLIPIZIDE) Take one tablet by mouth once daily;  #90 x 3;  Signed;  Entered by: [name], MD;  Authorized by: [name], MD (JR 5);  Method used: Electronically to [facility] [id number]*, [address], [place], [place]  [address], Ph: [phone], Fax: [phone]  Rx of INVOKANA 100 MG TABS (CANAGLIFLOZIN) DISCONTINUE;  #1 x 0;  Signed;  Entered by: [name], MD;  Authorized by: [name], MD (JR 5);  Method used: Electronically to [facility] [address]*, [address], [place], [place]  [address], Ph: [phone], Fax: [phone]        Created By Ranae Palms on 08/19/2015 at 08:58 AM    Electronically Signed By Clayborne Artist RN on 08/19/2015 at 11:40 AM

## 2015-08-26 ENCOUNTER — Ambulatory Visit

## 2015-09-15 ENCOUNTER — Ambulatory Visit

## 2015-09-15 ENCOUNTER — Ambulatory Visit: Admitting: Internal Medicine

## 2015-09-15 LAB — HX POINT OF CARE: HX HGB A1C, POC: 10.3 % — ABNORMAL HIGH (ref 4.3–5.8)

## 2015-09-15 NOTE — Progress Notes (Signed)
General Medicine Visit - Resident note with preceptor addendum  Service Due by Standard Protocol Rules: DIAB EYE EX, MAMMOGRAM, PATPORTALPIN.      Initial Screening   * [place]: Yu ([name])  Ht: 62.5 in.  Wt: 216.6 lbs.   BMI: 39.13  Temp: 97.6 deg F.     BP (Initial [place] Screening): 117 / 84      BP (Rechecked, Actionable): 117 / 84 mmHg   HR: 96     Healthy Eating materials? Handout for Jump[employer]  Travel outside of the [place] in past 28 days:: No  Chronic Pain Assessment: Does pt experience chronic pain ? YES  Severity of pain? (max=10) 2  Smoking Assessment: Tobacco use? never smoker      Patient consents for eRx History:  Paper    Falls Risk Assessment:   In the past year, have you ... Had no falls  Difficulty with balance? NO  Need assistance with ambulation while here? NO  Comments: ......................................Marland KitchenKelly Splinter  September 15, 2015 3:30 PM          **Resident Note**    * Preceptor: Rencic Gabriel Rung)  CC: Diabetes follow up    HPI: 9F with newly diagnosed diabetes here for follow up.     She has been checking her FS every morning and most nights: range is mostly <200 (range 276 max). Upon discussion with Marge at last visit for DM education, goal was <150 eventually.  She is working hard at this. She is focusing on her diet and exercise. HbA1c today 10.3.    Has hand pain/finger "stretching" sensation. Thinks it is related to scleroderma and will see her rheum physician soon.                     Review of Systems: ROS otherwise negative except as noted in HPI.      PROBLEMS:   PAST MEDICAL HISTORY (prior to today's visit):  TRANSAMINASES, SERUM, ELEVATED (ICD-790.4) (ICD10-R74.0)  OBESITY (ICD-278.00) (ICD10-E66.9)      DIABETES MELLITUS, TYPE II, UNCONTROLLED (ICD-250.02) (ICD10-E11.65)      FATTY LIVER DISEASE (ICD-571.8) (ICD10-K76.0)  ? 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)  CARPAL TUNNEL (ICD-354.0) (ICD10-G56.00)  OSTEOARTHRITIS (ICD-715.09) (ICD10-M15.9)  S/P ROTATOR CUFF SURGERY 12/2004 - RT SHOULDER; 2008 LT (ICD-V45.89)  Family Hx of MELANOMA, FAMILY HX (ICD-V16.8) (ICD10-Z80.8)  FRACTURE OF OTHER SPEC SITE,  PATHOLOGIC - MULTIPLE (ICD-733.19)  CHOLECYSTECTOMY AND HERNIA REPAIR (ICD-V45.89)  VITAMIN D DEFICIENCY (ICD-268.9) (ICD10-E55.9)  VACCINATION WITH TDAP (ICD-V06.8) (DGU44-I34)  COLONOSCOPY (ICD-V76.51) (ICD10-Z01.89)  PROBLEM CHANGES:  Added new problem of OSTEOPOROSIS SCREENING (ICD-V82.81) (ICD10-Z13.820) - Signed  Added new problem of LONG-TERM (CURRENT) USE OF STEROIDS (ICD-V58.65) (ICD10-Z79.51) - Signed  Added new problem of DIABETES MELLITUS, TYPE II (ICD-250.00) (ICD10-E11.9)       MEDICATIONS:   PAST MEDICINES (prior to today's visit):  PREDNISONE 10 MG TABS (PREDNISONE) TK 1 T PO  ONCE Daily  LEVOTHYROXINE SODIUM 25 MCG TABS (LEVOTHYROXINE SODIUM) Take one tablet by mouth once daily  OMEPRAZOLE 20 MG CPDR (OMEPRAZOLE) Take one capsule by mouth twice a day  IBUPROFEN 600 MG TABS (IBUPROFEN) 1 tab by mouth TID  as needed for pain  BACLOFEN 10 MG TABS (BACLOFEN) Please take half to one tablet as needed for back spasms  DICLOFENAC SODIUM 1 % GEL (DICLOFENAC SODIUM) APP 2 GRAMS EXT AA BID  PROAIR HFA 108 (90 BASE) MCG/ACT AERS (ALBUTEROL SULFATE) Inhale 2 puffs 4 times a day as needed for wheezing  LOTRISONE 1-0.05 % CREA (CLOTRIMAZOLE-BETAMETHASONE) apply to affected area on ankle twice a day      FREESTYLE FREEDOM LITE W/DEVICE KIT (BLOOD GLUCOSE MONITORING SUPPL) use as directed (ICD 10 E11.65)      FREESTYLE LITE TEST STRP (GLUCOSE BLOOD) check fingerstick three  times a day (ICD 10 E11.65)      FREESTYLE LANCETS MISC (LANCETS) check fingerstick three times a day (ICD 10 E11.65)  GLUCOPHAGE XR 500 MG XR24H-TAB (METFORMIN HCL) take four tablets by mouth daily every morning  ROSUVASTATIN CALCIUM 10 MG TABS (ROSUVASTATIN CALCIUM) take one tablet by mouth daily  GLIPIZIDE XL 5 MG TB24 (GLIPIZIDE) Take one tablet by mouth once daily    MEDICINE CHANGES:  Changed medication from BACLOFEN 10 MG TABS (BACLOFEN) Please take half to one tablet as needed for back spasms to BACLOFEN 10 MG TABS (BACLOFEN) Please take half to one tablet as needed for back spasms - Signed  Changed medication from IBUPROFEN 600 MG TABS (IBUPROFEN) 1 tab by mouth TID as needed for pain to IBUPROFEN 600 MG TABS (IBUPROFEN) 1 tab by mouth TID as needed for pain - Signed  Added new medication of TRAMADOL HCL 50 MG TABS (TRAMADOL HCL) take one tablet daily as needed for pain - Signed  Added new medication of OXYCODONE HCL 5 MG TABS (OXYCODONE HCL) Partial fill upon patient request.  Take one tablet daily as needed for pain >8 - Signed  Added new medication of CALCIUM CARBONATE-VITAMIN D 600-200 MG-UNIT TABS (CALCIUM CARBONATE-VITAMIN D) take one tablet twice a day - Signed  Rx of BACLOFEN 10 MG TABS (BACLOFEN) Please take half to one tablet as needed for back spasms;  #30 x 6;  Signed;  Entered by: [name], MD (JR 5);  Authorized by: [name], MD (JR 5);  Method used: Electronically to [facility] [address]*, [address], [place], [place]  [address], Ph: [phone], Fax: [phone]  Rx of IBUPROFEN 600 MG TABS (IBUPROFEN) 1 tab by mouth TID as needed for pain;  #30 x 4;  Signed;  Entered by: [name], MD (JR 5);  Authorized by: [name], MD (JR 5);  Method used: Electronically to [facility] [address]*, [address] [place], [place]  [id number], Ph: [phone], Fax: [phone]  Rx of TRAMADOL HCL 50 MG TABS (TRAMADOL HCL) take one tablet daily as needed for pain;  #30 Tablet x 0;  Signed;   Entered by: [name], MD (JR 5);  Authorized by: [name], MD (JR 5);  Method used: Print then Give to Patient  Rx of OXYCODONE HCL 5 MG TABS (OXYCODONE HCL) Partial fill upon patient request.  Take one tablet daily as needed for pain >8;  #20 x 0;  Signed;  Entered by: [name], MD (JR 5);  Authorized by: [name], MD (JR 5);  Method used: Print then Give to Patient  Rx of CALCIUM CARBONATE-VITAMIN D 600-200 MG-UNIT TABS (CALCIUM CARBONATE-VITAMIN D) take one tablet twice a day;  #60 x 12;  Signed;  Entered by: [name], MD (JR 5);  Authorized by: [name], MD (JR 5);  Method used: Electronically to [facility] [address]*,  [address] [place], [place]  [id number], Ph: [phone], Fax: [phone]       ALLERGIES:   No Known Allergies    DIRECTIVES:        Vitals:   BP: 117 / 84 mmHg Ht: 62.5 in.  Wt: 216.6 lbs.  BMI (in-lb) 39.13  Temp: 97.6deg F.   Pulse Rate: 96 bpm      Additional PE: Gen: pleasant, NAD      Assessment & Plan:   54F here for diabetes follow up.    #Diabetes:  -continue glipizide at 5mg , consider increasing at next visit in 2 months  -continue metformin  -continue FS checks  -continue diet and exercise plan  -recheck A1c in 2 months at next visit  -continue statin - tolerating well  -repeat U/A for microalbumin at next visit  -hold off on ACE-i until development of proteinuria    #LFTs:   -f/up with Dr. Althea Charon in August  -Colonoscopy (Screening, 2 day prep needed) scheduled for Aug too    #Rheum issues and chronic prednisone use:  Stopped taking prednisone for this month. PLanning to contact Rheum for f/up for worsening scleroderma symptoms.  Given her chronic steroid use, concern for osteoporosis risk. Last DEXA many yrs ago.  -Repeat DEXA ordered  -Consider ppx with bisphosphonates- will email Dr. Lavona Mound her rheumatologist to discuss  -started Ca/Vit D  -Already on omeprazole    RTC in 2 months for f/up of multiple issues (DM and LFTs) and annual exam        * Resident  Signature (.sign): .......................................[name], MD (JR 5)  September 15, 2015 5:30 PM        ORDERS:  NM DEXA - multiple sites [CPT-76075]  RTC to Resident Clinic [RTC-000]  Est Level 4 (1121/NP2292) [id number]      **Preceptor Note**     Resident: [name])  Problem Follow-up      Subjective   Patient is a 55 Years Old Female who presents to clinic diabetes doing better on meds, A1c down to 10    I discussed the patient with Dr. Marlane Hatcher [facility]) in a routine precepting encounter.  I agree with the patient's diagnosis and management.        Objective   gen: nad   see resident's note for details.      Assessment   diabetes: cont. current meds     Plan   See resident note for details, Follow-up per resident note          Patient Care Plan            Vaccine Info Sheets ]      Created By Kelly Splinter on 09/15/2015 at 03:30 PM    Electronically Signed By [name], MD on 09/16/2015 at 04:29 PM

## 2015-09-15 NOTE — Progress Notes (Signed)
General Medicine Visit - Resident note with preceptor addendum  Service Due by Standard Protocol Rules: DIAB EYE EX, MAMMOGRAM, PATPORTALPI  N.    .  Initial Screening   * [place]: Yu ([place])  Ht: 62.5 in.  Wt: 216.6 lbs.   BMI: 39.13  Temp: 97.6 deg F.     BP (Initial [place] Screening): 117 / 84      BP (Rechecked, Actionable): 117 /   84 mmHg   HR: 96     Healthy Eating materials? Handout for [employer]  Travel outside of the [place] in past 28 days:: No  Chronic Pain Assessment: Does pt experience chronic pain ? YES  Severity of pain? (max=10) 2  Smoking Assessment: Tobacco use? never smoker  .  [name]  Patient consents for eRx History:  Paper  .  Falls Risk Assessment:   In the past year, have you ... Had no falls  Difficulty with balance? NO  Need assistance with ambulation while here? NO  Comments: ......................................Marland KitchenKelly Splinter  September 15, 2015 3:  30 PM  .  .  .  .  **Resident Note**  .  * Preceptor: Rencic (Joe)  CC: Diabetes follow up  .  HPI: 55F with newly diagnosed diabetes here for follow up.   .  She has been checking her FS every morning and most nights: range is mostly   <200 (range 276 max). Upon discussion with Marge at last visit for DM educ  ation, goal was <150 eventually.  She is working hard at this. She is focus  ing on her diet and exercise. HbA1c today 10.3.  .  Has hand pain/finger "stretching" sensation. Thinks it is related to sclero  derma and will see her rheum physician soon.   .  .  .  .  .  .  .  .  .  Review of Systems: ROS otherwise negative except as noted in HPI.  .  .  PROBLEMS:   PAST MEDICAL HISTORY (prior to today's visit):  TRANSAMINASES, SERUM, ELEVATED (ICD-790.4) (ICD10-R74.0)  OBESITY (ICD-278.00) (ICD10-E66.9)      DIABETES MELLITUS, TYPE II, UNCONTROLLED (ICD-250.02) (ICD10-E11.65)      FATTY LIVER DISEASE (ICD-571.8) (ICD10-K76.0)  ? 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)  CARPAL TUNNEL (ICD-354.0) (ICD10-G56.00)  OSTEOARTHRITIS (ICD-715.09) (ICD10-M15.9)  S/P ROTATOR CUFF SURGERY 12/2004 - RT SHOULDER; 2008 LT (ICD-V45.89)  Family Hx of MELANOMA, FAMILY HX (ICD-V16.8) (ICD10-Z80.8)  FRACTURE OF OTHER SPEC SITE,  PATHOLOGIC - MULTIPLE (ICD-733.19)  CHOLECYSTECTOMY AND HERNIA REPAIR (ICD-V45.89)  VITAMIN D DEFICIENCY (ICD-268.9) (ICD10-E55.9)  VACCINATION WITH TDAP (ICD-V06.8) (FAO13-Y86)  COLONOSCOPY (ICD-V76.51) (ICD10-Z01.89)  PROBLEM CHANGES:  Added new problem of OSTEOPOROSIS SCREENING (ICD-V82.81) (ICD10-Z13.820) -   Signed  Added new problem of LONG-TERM (CURRENT) USE OF STEROIDS (ICD-V58.65) (ICD1  0-Z79.51) - Signed  Added new problem of DIABETES MELLITUS, TYPE II (ICD-250.00) (ICD10-E11.9)  .  MEDICATIONS:   PAST MEDICINES (prior to today's visit):  PREDNISONE 10 MG TABS (PREDNISONE) TK 1 T PO  ONCE Daily  LEVOTHYROXINE SODIUM 25 MCG TABS (LEVOTHYROXINE SODIUM) Take one tablet by   mouth once daily  OMEPRAZOLE 20 MG CPDR (OMEPRAZOLE)  Take one capsule by mouth twice a day  IBUPROFEN 600 MG TABS (IBUPROFEN) 1 tab by mouth TID as needed for pain  BACLOFEN 10 MG TABS (BACLOFEN) Please take half to one tablet as needed for   back spasms  DICLOFENAC SODIUM 1 % GEL (DICLOFENAC SODIUM) APP 2 GRAMS EXT AA BID  PROAIR HFA 108 (90 BASE) MCG/ACT AERS (ALBUTEROL SULFATE) Inhale 2 puffs 4   times a day as needed for wheezing  LOTRISONE 1-0.05 % CREA (CLOTRIMAZOLE-BETAMETHASONE) apply to affected area   on ankle twice a day      FREESTYLE FREEDOM LITE W/DEVICE KIT (BLOOD GLUCOSE MONITORING SUPPL) [place]  e as directed (ICD 10 E11.65)      FREESTYLE LITE  TEST STRP (GLUCOSE BLOOD) check fingerstick three times   a day (ICD 10 E11.65)      FREESTYLE LANCETS MISC (LANCETS) check fingerstick three times a day (I  CD 10 E11.65)  GLUCOPHAGE XR 500 MG XR24H-TAB (METFORMIN HCL) take four tablets by mouth d  aily every morning  ROSUVASTATIN CALCIUM 10 MG TABS (ROSUVASTATIN CALCIUM) take one tablet by m  outh daily  GLIPIZIDE XL 5 MG TB24 (GLIPIZIDE) Take one tablet by mouth once daily  .  MEDICINE CHANGES:  Changed medication from BACLOFEN 10 MG TABS (BACLOFEN) Please take half to   one tablet as needed for back spasms to BACLOFEN 10 MG TABS (BACLOFEN) Plea  se take half to one tablet as needed for back spasms - Signed  Changed medication from IBUPROFEN 600 MG TABS (IBUPROFEN) 1 tab by mouth TI  D as needed for pain to IBUPROFEN 600 MG TABS (IBUPROFEN) 1 tab by mouth TI  D as needed for pain - Signed  Added new medication of TRAMADOL HCL 50 MG TABS (TRAMADOL HCL) take one tab  let daily as needed for pain - Signed  Added new medication of OXYCODONE HCL 5 MG TABS (OXYCODONE HCL) Partial fil  l upon patient request.  Take one tablet daily as needed for pain >8 - Sign  ed  Added new medication of CALCIUM CARBONATE-VITAMIN D 600-200 MG-UNIT TABS (C  ALCIUM CARBONATE-VITAMIN D) take one tablet twice a day - Signed  Rx of BACLOFEN 10 MG TABS (BACLOFEN) Please take half to one tablet as need  ed for back spasms;  #30 x 6;  Signed;  Entered by: [name], MD (JR 5)  ;  Authorized by: [name], MD (JR 5);  Method used: Electronically to   [facility] [address]*, [address], [place], [place]  [id number], Ph: [phone], Fax: [phone]  Rx of IBUPROFEN 600 MG TABS (IBUPROFEN) 1 tab by mouth TID as needed for pa  in;  #30 x 4;  Signed;  Entered by: [name], MD (JR 5);  Authorized by  : [name], MD (JR 5);  Method used: Electronically to [facility] [address]*, [address] [place], [place]  [address], Ph: [phone], Fax: [phone]  Rx of TRAMADOL HCL 50 MG TABS  (TRAMADOL HCL) take one tablet daily as neede  d for pain;  #30 Tablet x 0;  Signed;  Entered by: [name], MD (JR 5);    Authorized by: [name], MD (JR 5);  Method used: Print then Give to   Patient  Rx of OXYCODONE HCL 5 MG TABS (OXYCODONE HCL) Partial fill upon patient req  uest.  Take one tablet daily as needed for pain >8;  #20 x 0;  Signed;  Ent  ered by: [name], MD (JR 5);  Authorized by: [name], MD (JR 5);    Method used: Print then Give to Patient  Rx of CALCIUM CARBONATE-VITAMIN D 600-200 MG-UNIT TABS (CALCIUM CARBONATE-V  ITAMIN D) take one tablet twice a day;  #60 x 12;  Signed;  Entered by: Man  asa Mouli, MD (JR 5);  Authorized by: [name], MD (JR 5);  Method used  : Electronically to [facility] [address]*, [address], [place], [place]    [id number], Ph: [phone], Fax: [phone]  .  ALLERGIES:   No Known Allergies  .  DIRECTIVES:   .  Vitals:   BP: 117 / 84 mmHg Ht: 62.5 in.  Wt: 216.6 lbs.  BMI (in-lb) 39.13  Temp: 97.6deg F.   Pulse Rate: 96 bpm  .  .  Additional PE: Gen: pleasant, NAD  .  [name]  Assessment /T/ Plan:   74F here for diabetes follow up.  [name]  #Diabetes:  -continue glipizide at 5mg , consider increasing at next visit in 2 months  -continue metformin  -continue FS checks  -continue diet and exercise plan  -recheck A1c in 2 months at next visit  -continue statin - tolerating well  -repeat U/A for microalbumin at next visit  -hold off on ACE-i until development of proteinuria  .  #LFTs:   -f/up with Dr. Althea Charon in August  -Colonoscopy (Screening, 2 day prep needed) scheduled for Aug too  .  #Rheum issues and chronic prednisone use:  Stopped taking prednisone for this month. PLanning to contact Rheum for f/u  p for worsening scleroderma symptoms.  Given her chronic steroid use, concern for osteoporosis risk. Last DEXA man  y yrs ago.  -Repeat DEXA ordered  -Consider ppx with bisphosphonates- will email Dr. Lavona Mound her rheumatologi  st to discuss  -started Ca/Vit  D  -Already on omeprazole  .  RTC in 2 months for f/up of multiple issues (DM and LFTs) and annual exam  .  .  .  * Resident Signature (.sign): .......................................[name], MD (JR 5)  September 15, 2015 5:30 PM  .  .  .  ORDERS:  NM DEXA - multiple sites [CPT-76075]  RTC to Resident Clinic [RTC-000]  Est Level 4 (1121/NP2292) [[id number]]  .  [name]  **Preceptor Note**  Resident: Marlane Hatcher (Manasa)  Problem Follow-up  .  [name]  Subjective   Patient is a 55 Years Old Female who presents to clinic diabetes doing bett  er on meds, A1c down to 10    I discussed the patient with Dr. Marlane Hatcher [facility]) in a routine precepting enc  ounter.  I agree with the patient's diagnosis and management.    .  .  Objective   gen: nad   see resident's note for details.    .  Assessment   diabetes: cont. current meds   .  Plan   See resident note for details, Follow-up per resident note  .  .  .  .  Patient Care Plan  .  .  .  .  .  Vaccine Info Sheets ]  .  [name]  Electronically Signed by [name], MD on 09/16/2015 at 4:29 PM  ________________________________________________________________________

## 2015-09-16 ENCOUNTER — Ambulatory Visit: Admitting: Rheumatology

## 2015-09-16 NOTE — Progress Notes (Signed)
* * *        **  [name]**    --- ---    20 Y old Female, DOB: [birth year]    [address], [place], [place] [address]    Home: [phone]    Provider: [name]        * * *    Telephone Encounter    ---    Answered by   Alexis Frock  Date: 09/16/2015         Time: 12:41 PM    Caller   Patient    --- ---            Reason   Medication question            Message                      Hello, patient states she ran out of PredniSONE 10 mg Tablet at the end of June, and since then she has been having hand swelling. She would like to know if [name] would like to start her on another medication. Also patient is currently scheduled for September but would like to come in sooner, afternoon preferred. Best contact number [phone]. Thank you.                 Action Taken   Mims,Rateeka 09/16/2015 12:44:28 PM > Sateriale,Rachel 09/16/2015  12:47:09 PM > [name],[name] 09/27/2015 9:50:16 AM > patient called again, i  spoke to Dr.Sanskriti Greenlaw he asked that she be put in sooner.pATIENT SCHEDULED FOR  7/3 AT 8:30                * * *                ---          * * *          Patient: ARMELLA, STOGNER DOB: April 01, 1961 Provider: [name] 09/16/2015    ---    Note generated by eClinicalWorks EMR/PM Software ([email or web address])

## 2015-09-23 ENCOUNTER — Ambulatory Visit: Admitting: Internal Medicine

## 2015-09-27 ENCOUNTER — Ambulatory Visit: Admitting: Rheumatology

## 2015-09-27 NOTE — Progress Notes (Signed)
* * *        **  [name]**    --- ---    52 Y old Female, DOB: [birth year]    [address] [place], [place] [address]    Home: [phone]    Provider: [name]        * * *    Telephone Encounter    ---    Answered by   Cline Crock  Date: 09/27/2015         Time: 11:40 AM    Message                      Patient was booked with Dr. Margret Chance in the UC clinic on Monday but he doesnt start until 07/05. So [name] will see this patient at 10:00 on Monday. I did leave a patient a vm letting her know.        --- ---            Action Taken   Sateriale,Rachel 09/27/2015 11:41:16 AM >                * * *                ---          * * *          Patient: [name], [name] DOB: 1960-09-17 Provider: [name] 09/27/2015    ---    Note generated by eClinicalWorks EMR/PM Software ([email or web address])

## 2015-09-28 ENCOUNTER — Ambulatory Visit

## 2015-09-28 NOTE — Telephone Encounter (Signed)
PHONE NOTE     CALL INFORMATION:   Reason for call: Other  Person Calling: Khadejah Son  Relationship to pt: Pt  PCP: Dr. Marlane Hatcher  Time/Date of call: 09/28/2015  Day Phone #: [phone]      CALL DETAILS:   Patient prefers a call back regarding to reschedule the bone density appointment.  Patient would like the appointment in the morning on the Monday 10/04/2015 or Thursday 7/06/52017.  Patient's contact number is [phone]    Thank you,  Call Taken by: ......................................Marland KitchenCheri Guppy  September 28, 2015 11:52 AM          RESPONSE/ORDERS:    LVM asking pt. to call back to r/s Bone Scan.......................................Marland KitchenScott Baker  September 29, 2015 9:42 AM  pt. is all set. 10/04/15 at 9:10.......................................Marland KitchenScott Baker  September 29, 2015 12:08 PM               ORDERS/PROBS/MEDS/ALL     Problems:   DIABETES MELLITUS, TYPE II (ICD-250.00) (ICD10-E11.9)  LONG-TERM (CURRENT) USE OF STEROIDS (ICD-V58.65) (ICD10-Z79.51)  OSTEOPOROSIS SCREENING (ICD-V82.81) (ICD10-Z13.820)  TRANSAMINASES, SERUM, ELEVATED (ICD-790.4) (ICD10-R74.0)  OBESITY (ICD-278.00) (ICD10-E66.9)      DIABETES MELLITUS, TYPE II, UNCONTROLLED (ICD-250.02) (ICD10-E11.65)      FATTY LIVER DISEASE (ICD-571.8) (ICD10-K76.0)  ? 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)  CARPAL TUNNEL (ICD-354.0) (ICD10-G56.00)  OSTEOARTHRITIS (ICD-715.09) (ICD10-M15.9)  S/P ROTATOR CUFF SURGERY 12/2004 - RT SHOULDER; 2008 LT (ICD-V45.89)  Family Hx of MELANOMA, FAMILY HX (ICD-V16.8) (ICD10-Z80.8)  FRACTURE OF OTHER  SPEC SITE,  PATHOLOGIC - MULTIPLE (ICD-733.19)  CHOLECYSTECTOMY AND HERNIA REPAIR (ICD-V45.89)  VITAMIN D DEFICIENCY (ICD-268.9) (ICD10-E55.9)  VACCINATION WITH TDAP (ICD-V06.8) ([id number])  COLONOSCOPY (ICD-V76.51) (ICD10-Z01.89)    Meds (prior to this call):   PREDNISONE 10 MG TABS (PREDNISONE) TK 1 T PO  ONCE Daily  LEVOTHYROXINE SODIUM 25 MCG TABS (LEVOTHYROXINE SODIUM) Take one tablet by mouth once daily  OMEPRAZOLE 20 MG CPDR (OMEPRAZOLE) Take one capsule by mouth twice a day  IBUPROFEN 600 MG TABS (IBUPROFEN) 1 tab by mouth TID as needed for pain  BACLOFEN 10 MG TABS (BACLOFEN) Please take half to one tablet as needed for back spasms  DICLOFENAC SODIUM 1 % GEL (DICLOFENAC SODIUM) APP 2 GRAMS EXT AA BID  PROAIR HFA 108 (90 BASE) MCG/ACT AERS (ALBUTEROL SULFATE) Inhale 2 puffs 4 times a day as needed for wheezing  LOTRISONE 1-0.05 % CREA (CLOTRIMAZOLE-BETAMETHASONE) apply to affected area on ankle twice a day      FREESTYLE FREEDOM LITE W/DEVICE KIT (BLOOD GLUCOSE MONITORING SUPPL) use as directed (ICD 10 E11.65)      FREESTYLE LITE TEST STRP (GLUCOSE BLOOD) check fingerstick three times a day (ICD 10 E11.65)      FREESTYLE LANCETS MISC (LANCETS) check fingerstick three times a day (ICD 10 E11.65)  GLUCOPHAGE XR 500 MG XR24H-TAB (METFORMIN HCL) take four tablets by mouth daily every morning  ROSUVASTATIN CALCIUM 10 MG TABS (ROSUVASTATIN CALCIUM) take one tablet by mouth daily  GLIPIZIDE XL 5 MG TB24 (GLIPIZIDE) Take one tablet by mouth once daily  TRAMADOL HCL 50 MG TABS (TRAMADOL HCL) take one tablet daily as needed for pain  OXYCODONE HCL 5  MG TABS (OXYCODONE HCL) Partial fill upon patient request.  Take one tablet daily as needed for pain >8  CALCIUM CARBONATE-VITAMIN D 600-200 MG-UNIT TABS (CALCIUM CARBONATE-VITAMIN D) take one tablet twice a day          Created By Cheri Guppy on 09/28/2015 at 11:49 AM    Electronically Signed By Linward Headland on 09/29/2015 at 12:08 PM

## 2015-10-04 ENCOUNTER — Ambulatory Visit

## 2015-10-04 ENCOUNTER — Ambulatory Visit: Admitting: Rheumatology

## 2015-10-04 ENCOUNTER — Ambulatory Visit: Admitting: Internal Medicine

## 2015-10-04 NOTE — Progress Notes (Signed)
.  Progress Notes  .  Patient: [name], [name]  Provider: [name]  DOB: 1960/09/25 Age: 55 Y Sex: Female  .  PCP: [name]  MD  Date: 10/04/2015  .  --------------------------------------------------------------------------------  .  REASON FOR APPOINTMENT  .  1. Hand Pain  .  HISTORY OF PRESENT ILLNESS  .  GENERAL:   Ms Dalto is here for urgent follow up due to hand pain and  swelling. Her symptoms had been well controlled on low dose  prednisone. She ran out of the medication several days agon and  abruptly the pain stiffness and swelling have returned. She  struggles with ADLs as a result of the pain which is mostly in  the PIPs and MCPs. She has had some minor pain in the feet and  knees as well. No fevers or chills, cough or shortness of breath.  .  CURRENT MEDICATIONS  .  Taking Ibuprofen  Taking Levothyroxine Sodium 50 MCG Tablet 1 tablet Once a day  Taking Nystatin 100000 unit/gm Cream 1 application to affected  area Twice a day  Taking Omeprazole 40 mg Capsule Delayed Release 1 capsule Once a  day  Taking PredniSONE 10 mg Tablet 1 tablet Once a day  Taking Voltaren 1 % Gel 2 grams twice daily  Taking METFORMIN TAB 500MG  4 tablets daily  Taking GLIPIZIDE 5MG  TABLETS 1 tablet daily  Not-Taking/PRN Flovent HFA 44 MCG/ACT Aerosol 2 puffs Twice a day  .  PAST MEDICAL HISTORY  .  Scleroderma - CREST dx 2007  IgG4 deficiency s/p IVIG 2010 ----single infusion given  preventively after week of bilateral knee replacements at [facility]  2010  Fx left wrist in 1994  Blood clot at LUE in 2006 on a short course of Coumadin  Bilateral carpal tunnel syndrome s/p Lt carpal tunnel release and  steroids injection right--  Bone spur left foot  Scoliosis  Spinal stenosis s/p steroids injection  s/p bilateral knee replacements for valgus /arthritic  complications, performed by Dr. Katrinka Blazing 2010--- never infected, but  packed with antibiotics with surgery;  Right rotator cuff repairs x 4, complicated by repeat  tears,  infection, placement of anchor material  Elevated liver function tests  .  ALLERGIES  .  N.K.D.A.  .  SOCIAL HISTORY  .  .  Tobacco  history: Never smoked.  .  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  .  Alcohol Former daily EtOH use in 20s.  .  Nonsmoker.Lives with longstanding boyfriend.  [name]  REVIEW OF SYSTEMS  .  ADULT Rheumatology ROS :  .  Constitutional    No Recent weight gain, Fatigue, Generalized  weakness, Fever, Chills . Eyes    No Pain, Redness, Loss of  vision, Double or blurred vision, Dryness, Feels like something  in eye, Itching eyes . HENT    No Headache, Loss of hearing,  Sores in mouth, Dryness of mouth . Respiratory    + Swollen legs  or feet . Gastrointestinal    No Nausea, Vomiting of blood or  coffee ground material, Jaundice, Increasing constipation, Blood  in stools, Black stools, Heartburn, Diarrhea . Genitourinary     No Difficult urination, Blood in urine, Pus in urine .  Musculoskeletal    + Joint pain, Joint swelling, Morning  stiffness, Muscle Pain . Integumentary (skin and/or breast)    No  Easy bruising, Rash, Tightness, Hair loss, Raynaud's phenomenon .  Neurological System    No Headaches, Dizziness, Fainting,  Muscle  spasm, Memory loss, Night sweats . Psychiatric    No Excessive  worries, Anxiety, Easily losing temper, Depression, Agitation,  Sleep disturbance, Night sweats . Endocrine    No Excessive  thirst . Hematologic/Lymphatic    No Swollen glands, Anemia,  Bleeding tendency . Allergic/Immunologic    No Frequent sneezing,  Increased susceptibility to infection, Raynauds . Heart    No  Chest Pain, Palpitations, Heart murmur, Irregular heart beat .  [name]  VITAL SIGNS  .  Pain scale 8, Ht-in 62, Wt-lbs 212, BMI 38.77, BP 114/74, HR 84.  [name]  EXAMINATION  .  RAPID 3: Function(0-10): 1.7. Pain(0-10):8.5.  Patient Global(0-10):0. Rapid 3(0-30): 10. Category(HS, MS, LS,  R):MS = 6.1-12.  [name]  Rheumatology:  General AppearanceAlert and oriented , No apparent  distress.  HENT:No, alopecia.  Eyes:No, scleral icterus, scleral erythema.  ZOX:WRUEA LE edema.  Skin:No, rash.  Muskuloskeletalthere is marked synovitis in the PIPs and MCPS of  the hands bilaterally. Mild synovial thickening in the wrists.  Elbows, shoulders, hips, knees have intact ROM with no synovitis.  MTP squeeze positive. .  .  ASSESSMENTS  .  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  Elevated liver enzymes - R74.8  .  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  Scleroderma - M34.9  .  She has a flare of inflammatory arthritis since stopping  steroids. To resolve the acute flare, I will restart the  steroids. The long term treatment is more complicated because of  her comorbidities. Her IgG 4 deficiency means the risk of  significant infections when using immunosuppressants. Her liver  disease prevents the use of typical oral DMARDs. She is due to  see hepatology for consideration of liver biopsy. For now, I  recommended a trial of Plaquenil as a steroid sparing agent. We  will continue steroids for the next 2 months until that kicks in.  I will plan to see her back around that time.  .  TREATMENT  .  Inflammatory arthritis  Start PredniSONE Tablet, 5 mg, 2 tablet, Orally, Once a day, 30  day(s), 60 Tablet, Refills 3  Start Hydroxychloroquine Sulfate Tablet, 200 mg, 1 tablet with  food or milk, Orally, Once a day, 30 days, 30 Tablet, Refills 3  .  FOLLOW UP  .  2 Months  .  Electronically signed by Erasmo Leventhal , MD on  10/23/2015 at 08:40 PM EDT  .  Document electronically signed by [name]

## 2015-10-04 NOTE — Progress Notes (Signed)
* * *        [name]**    --- ---    44 Y old Female, DOB: Aug 12, 1960, External MRN: [id number]    Account Number: [id number]    [address] [id number]    Home: [phone]    Guarantor: [name] Insurance: [place]    PCP: [name] Forts, MD Referring: [name] Curling, MD    Appointment Facility: Rheumatology        * * *    10/04/2015  Progress Notes: [name] Pimple, MD **CHN#:** [phone]    --- ---    ---        Reason for Appointment    ---      1\. Hand Pain    ---      History of Present Illness    ---     _GENERAL_ :    [name] is here for urgent follow up due to hand pain and swelling. Her  symptoms had been well controlled on low dose prednisone. She ran out of the  medication several days agon and abruptly the pain stiffness and swelling have  returned. She struggles with ADLs as a result of the pain which is mostly in  the PIPs and MCPs. She has had some minor pain in the feet and knees as well.  No fevers or chills, cough or shortness of breath.      Current Medications    ---    Taking     * Ibuprofen     ---    * Levothyroxine Sodium 50 MCG Tablet 1 tablet Once a day    ---    * Nystatin 100000 unit/gm Cream 1 application to affected area Twice a day    ---    * Omeprazole 40 mg Capsule Delayed Release 1 capsule Once a day    ---    * PredniSONE 10 mg Tablet 1 tablet Once a day    ---    * Voltaren 1 % Gel 2 grams twice daily    ---    * METFORMIN TAB 500MG  4 tablets daily    ---    * GLIPIZIDE 5MG  TABLETS 1 tablet daily    ---    Not-Taking/PRN    * Flovent HFA 44 MCG/ACT Aerosol 2 puffs Twice a day    ---      Past Medical History    ---       Scleroderma - CREST dx 2007.        ---    IgG4 deficiency s/p IVIG 2010 ----single infusion given preventively after  week of bilateral knee replacements at [facility] 2010.        ---    Fx left wrist in 1994.        ---    Blood clot at LUE in 2006 on a short course of Coumadin.        ---    Bilateral carpal tunnel syndrome s/p Lt carpal  tunnel release and steroids  injection right--.        ---    Bone spur left foot.        ---    Scoliosis.        ---    Spinal stenosis s/p steroids injection.        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 Katrinka [name] 2010--- never infected, but packed with  antibiotics with  surgery;.        ---    Right rotator cuff repairs x 4, complicated by repeat tears, infection,  placement of anchor material.        ---    Elevated liver function tests.        ---      Social History    ---    Tobacco history: Never smoked.    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    Alcohol  Former daily EtOH use in 20s.   Nonsmoker.    Lives with longstanding boyfriend.    ---      Allergies    ---      N.K.D.A.    ---      Review of Systems    ---     _ADULT Rheumatology ROS_ :    Constitutional No Recent weight gain, Fatigue, Generalized weakness, Fever,  Chills. Eyes No Pain, Redness, Loss of vision, Double or blurred vision,  Dryness, Feels like something in eye, Itching eyes. HENT No Headache, Loss of  hearing, Sores in mouth, Dryness of mouth. Respiratory **+ Swollen legs or  feet**. Gastrointestinal No Nausea, Vomiting of blood or coffee ground  material, Jaundice, Increasing constipation, Blood in stools, Black stools,  Heartburn, Diarrhea. Genitourinary No Difficult urination, Blood in urine, Pus  in urine. Musculoskeletal **+ Joint pain, Joint swelling, Morning stiffness,  Muscle Pain**. Integumentary (skin and/or breast) No Easy bruising, Rash,  Tightness, Hair loss, Raynaud's phenomenon. Neurological System No Headaches,  Dizziness, Fainting, Muscle spasm, Memory loss, Night sweats. Psychiatric No  Excessive worries, Anxiety, Easily losing temper, Depression, Agitation, Sleep  disturbance, Night sweats. Endocrine No Excessive thirst.  Hematologic/Lymphatic No Swollen glands, Anemia, Bleeding tendency.  Allergic/Immunologic No Frequent sneezing, Increased  susceptibility to  infection, Raynauds. Heart No Chest Pain, Palpitations, Heart murmur,  Irregular heart beat.          Vital Signs    ---    Pain scale 8, Ht-in 62, Wt-lbs 212, BMI 38.77, BP 114/74, HR 84.      Examination    ---     _RAPID 3_ :    Function (0-10): 1.7.        Pain (0-10): 8.5.        Patient Global (0-10): 0.        Rapid 3 (0-30): 10.        Category (HS, [name], LS, R): [name] = 6.1-12.    _Rheumatology_ :    General Appearance Alert and oriented , No apparent distress.    HENT: No, alopecia.    Eyes: No, scleral icterus, scleral erythema.    Ext: trace LE edema.    Skin: No, rash.    Muskuloskeletal there is marked synovitis in the PIPs and MCPS of the hands  bilaterally. Mild synovial thickening in the wrists. Elbows, shoulders, hips,  knees have intact ROM with no synovitis. MTP squeeze positive. .          Assessments    ---    1\. 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 Elevated liver enzymes - R74.8    ---    3\. 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 Scleroderma - M34.9    ---      She has a flare of inflammatory arthritis since stopping steroids. To  resolve the acute flare, I will restart the steroids. The long term treatment  is more complicated because of her comorbidities. Her IgG 4 deficiency means  the risk of significant infections when using immunosuppressants. Her liver  disease prevents the use of typical oral DMARDs. She is due to see hepatology  for consideration of liver biopsy. For now, I recommended a trial of Plaquenil  as a steroid sparing agent. We will continue steroids for the next 2 months  until that kicks in. I will plan to see her back around that time.    ---      Treatment    ---       **1\. Inflammatory arthritis**    Start PredniSONE Tablet, 5 mg, 2 tablet, Orally, Once a day, 30 day(s), 60  Tablet, Refills 3    Start Hydroxychloroquine Sulfate Tablet, 200 mg, 1 tablet with food or milk,  Orally, Once a day, 30 days, 30 Tablet, Refills 3    ---       Follow Up    ---    2 Months    Electronically signed by Erasmo Leventhal , MD on 10/23/2015 at 08:40 PM EDT    Sign off status: Completed        * * *        Rheumatology    [address]    [place], [place] [address]    Tel: [phone]    Fax: [phone]              * * *          Patient: [name], [name] DOB: 06/23/1960 Progress Note: [name] Pimple, MD  10/04/2015    ---    Note generated by eClinicalWorks EMR/PM Software ([email or web address])

## 2015-10-08 LAB — HX OSTEOPOROSIS

## 2015-10-11 ENCOUNTER — Ambulatory Visit

## 2015-10-11 NOTE — Progress Notes (Signed)
[facility] October 11, 2015  [address]   [place]  [place] [address]  Main: [phone]  Fax: [phone]  Patient Portal: [name]  [address]  [place], [place]  [address]                                MR#: [id number]                                         DOB:02-13-1961    Dear  [name],      The results of the tests performed during your visit are as follows:  Your bone density scan showed normal bone mass.      Please call me if you have any questions.        Sincerely,      [name], MD (JR 5)  [facility]  [phone]    ** Please be aware of our new extended hours to better care for you. Hours are: Monday through Thursday from 8 am to 8 pm; Friday from 8 am to 5 pm; Saturday from 8 am to 12 noon.        Created By [name], MD on 10/11/2015 at 02:04 PM    Electronically Signed By [name], MD on 10/11/2015 at 02:04 PM

## 2015-11-03 ENCOUNTER — Ambulatory Visit: Admitting: Rheumatology

## 2015-11-03 NOTE — Progress Notes (Signed)
* * *        **  [name]**    --- ---    62 Y old Female, DOB: [birth year]    [address], [place], [place] [address]    Home: [phone]    Provider: [name]        * * *    Telephone Encounter    ---    Answered by   Cline Crock  Date: 11/03/2015         Time: 02:28 PM    Refills  Refill Omeprazole Capsule Delayed Release, 40 mg, Orally, 60 Capsule,  1 capsule, Once a day, 60 days, Refills=3    --- ---          * * *                ---          * * *          PatientDenny [name], [name] DOB: 01/05/1961 Provider: [name] 11/03/2015    ---    Note generated by eClinicalWorks EMR/PM Software ([email or web address])

## 2015-11-15 ENCOUNTER — Ambulatory Visit

## 2015-11-15 ENCOUNTER — Ambulatory Visit: Admitting: Gastroenterology

## 2015-11-15 LAB — HX POINT OF CARE: HX GLUCOSE-POCT: 181 mg/dL — ABNORMAL HIGH (ref 70–139)

## 2015-11-18 LAB — HX COLONOSCOPY

## 2015-11-18 LAB — HX SURGICAL

## 2015-11-24 ENCOUNTER — Ambulatory Visit: Admitting: Gastroenterology

## 2015-11-24 ENCOUNTER — Ambulatory Visit

## 2015-11-24 ENCOUNTER — Ambulatory Visit: Admitting: Internal Medicine

## 2015-11-24 LAB — HX BF-CHEM/URINE
HX ALBUMIN RANDOM URINE: <0.5 mg/dL
HX CREATININE, RANDOM URINE: 58.2 mg/dL

## 2015-11-24 LAB — HX POINT OF CARE: HX HGB A1C, POC: 8 % — ABNORMAL HIGH (ref 4.3–5.8)

## 2015-11-24 LAB — HX DIABETES: HX ALBUMIN RANDOM URINE: <0.5 mg/dL

## 2015-11-24 NOTE — Progress Notes (Signed)
[facility] November 24, 2015  [address]   [place]  [place] [address]  Main: [phone]  Fax: [phone]  Patient Portal: [name]  [address]  [place], [place]  [address]  MR#: [id number]      Dear  [name],      I am writing to let you know that the following appointments have been made for you:    Mammogram  Date/Time:  01/17/16 at 8:45AM  The mammography suite is located in the [facility], 7th floor. Should you need to make any changes in your appointment, please call [phone].    Podiatry Clinic  Date/Time: 02/17/16 at 9:00AM  Doctor:  Vassie Loll  The Podiatry is located in the [facility], 7th floor. Should you need to   make any changes in your appointment, their office number is [phone].      Ophthalmology Clinic   Date/Time:  they will call patient   Doctor:    The [facility] Clinic is located in the [facility], 9th floor. Should you need to make   any changes in your appointment, their office number is [phone].      Please call me if you have any questions.        Sincerely,          [facility]  [phone]                Created By Everlene Farrier on 11/24/2015 at 03:33 PM    Electronically Signed By [name], MD on 11/24/2015 at 04:00 PM

## 2015-11-24 NOTE — Progress Notes (Signed)
Checkout  Return to Clinic 6 months  Appointment Made No  Reason Appointment Not Made Provider/Team had no available slots          Created By Everlene Farrier on 11/24/2015 at 03:47 PM    Electronically Signed By Everlene Farrier on 11/24/2015 at 03:47 PM

## 2015-11-24 NOTE — Progress Notes (Signed)
General Medicine Visit - Resident note with preceptor addendum  Service Due by Standard Protocol Rules: DIAB EYE EX, MAMMOGRAM, PATPORTALPI  N, HGBA1C.    .  Initial Screening   * [name]: OTHER  Ht: 62.5 in.  Wt: 213.4 lbs.   BMI: 38.55  Temp: 99.2 deg F.     BP (Initial [place] Screening): 121 / 76      BP (Rechecked, Actionable): 121 /   76 mmHg   HR: 106     Travel outside of the [place] in past 28 days:: No  Chronic Pain Assessment: Does pt experience chronic pain ? YES  Severity of pain? (max=10) 2  Smoking Assessment: Tobacco use? never smoker  .  Self Management materials provided? Printed handout: Keeping You Healthy  .  Med List: PRINTED by [facility] for patient   Med List: PRINTED by [facility] for patient   Patient consents for eRx History:  Paper  .  Falls Risk Assessment:   In the past year, have you ... Had no falls  Difficulty with balance? NO  Need assistance with ambulation while here? NO  Comments: ......................................Marland KitchenErskine Squibb   August 2  3, 2017 2:05 PM  .  .  .  .  **Resident Note**  .  * Preceptor: Rencic (Joe)  CC: follow up for diabetes  .  HPI: 89F with DM here for annual exam.  .  SHe has been checking her FS at home, ranging mostly low 100s. Some morning   low values of 39-66. She thinks the hypoglycemia may be due to none-to-low   PO intake at dinner time the nights preceding the low values. Her A1c is 8   today, down from 10.3.   [name]  She has been working on her weight loss; has lost 3 more lbs.  .  She is seeing [name] and Dr. Althea Charon next week for follow up of her ar  thritis, scleroderma and transaminitis workup, respectively. She reports th  at [name] has been weaning her prednisone in an attempt to get her off   of it to transition to hydroxychloroquine.  .  .  .  .  Family History: (reviewed)   Father: DMII, died of MI at 24  Mom: died of lung CA at 28  MGM and PGM: lung ca (smokers)  MGF and several other second degree relatives: brain aneurysms in their 18'  s.  2 maternal  uncles:melanoma (one died of unknown cancer, one living)  brother recently diagnosed with systemic scleroderma  .  Social History: (reviewed)   Non-smoker, rare etoh, no IVDU. 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 Living together with her husband of /R/20 yea  rs. Sexually active with one female partner husband only. No children. Always   wears a seat belt.  [name]  HEALTH MAINTENANCE  TDAP administered 11/04/14, next 2026  Mammogram ordered now (2017)  Pap smear next in 2018  DEXA 10/2015, WNL  RTC in the fall for influenza vaccine  Colonoscopy: done 2016, repeat 2017 and both times poor prep but overall ha  s been sufficient for screening purposes.  1 polyp removed, next colonoscop  y in 3 yrs (2020). At that time in 2020, will do 7 days of daily miralax. S  even days low fiber diet.   Two days clear liquids and a split prep with 2 dulcolax before each dose   of golytely.  [name]  [name]  Review of Systems: ROS otherwise negative except as noted in   .  [name]  PROBLEMS:   PAST MEDICAL HISTORY (prior to today's visit):  DIABETES MELLITUS, TYPE II (ICD-250.00) (ICD10-E11.9)  LONG-TERM (CURRENT) USE OF STEROIDS (ICD-V58.65) (ICD10-Z79.51)  OSTEOPOROSIS SCREENING (ICD-V82.81) (ICD10-Z13.820)  TRANSAMINASES, SERUM, ELEVATED (ICD-790.4) (ICD10-R74.0)  OBESITY (ICD-278.00) (ICD10-E66.9)      DIABETES MELLITUS, TYPE II, UNCONTROLLED (ICD-250.02) (ICD10-E11.65)      FATTY LIVER DISEASE (ICD-571.8) (ICD10-K76.0)  ? 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)  CARPAL TUNNEL  (ICD-354.0) (ICD10-G56.00)  OSTEOARTHRITIS (ICD-715.09) (ICD10-M15.9)  S/P ROTATOR CUFF SURGERY 12/2004 - RT SHOULDER; 2008 LT (ICD-V45.89)  Family Hx of MELANOMA, FAMILY HX (ICD-V16.8) (ICD10-Z80.8)  FRACTURE OF OTHER SPEC SITE,  PATHOLOGIC - MULTIPLE (ICD-733.19)  CHOLECYSTECTOMY AND HERNIA REPAIR (ICD-V45.89)  VITAMIN D DEFICIENCY (ICD-268.9) (ICD10-E55.9)  VACCINATION WITH TDAP (ICD-V06.8) (WGN56-O13)  COLONOSCOPY (ICD-V76.51) (ICD10-Z01.89)  PROBLEM CHANGES:  Added new problem of MAMMOGRAPHIC SCREENING FOR BREAST CANCER (ICD-V76.12)   (ICD10-Z12.31) - Signed  Added new problem of ANNUAL EXAM (ICD-V72.31) (ICD10-Z00.00) - Signed  .  MEDICATIONS:   PAST MEDICINES (prior to today's visit):  PREDNISONE 10 MG TABS (PREDNISONE) TK 1 T PO  ONCE Daily  LEVOTHYROXINE SODIUM 25 MCG TABS (LEVOTHYROXINE SODIUM) Take one tablet by   mouth once daily  OMEPRAZOLE 20 MG CPDR (OMEPRAZOLE) Take one capsule by mouth twice a day  IBUPROFEN 600 MG TABS (IBUPROFEN) 1 tab by mouth TID as needed for pain  BACLOFEN 10 MG TABS (BACLOFEN) Please take half to one tablet as needed for   back spasms  DICLOFENAC SODIUM 1 % GEL (DICLOFENAC SODIUM) APP 2 GRAMS EXT AA BID  PROAIR HFA 108 (90 BASE) MCG/ACT AERS (ALBUTEROL SULFATE) Inhale 2 puffs 4   times a day as needed for wheezing  LOTRISONE 1-0.05 % CREA (CLOTRIMAZOLE-BETAMETHASONE) apply to affected area   on ankle twice a day      FREESTYLE FREEDOM LITE W/DEVICE KIT (BLOOD GLUCOSE MONITORING SUPPL) [place]  e as directed (ICD 10 E11.65)      FREESTYLE LITE TEST STRP (GLUCOSE BLOOD) check fingerstick three times   a day (ICD 10 E11.65)      FREESTYLE LANCETS MISC (LANCETS) check fingerstick three times a day (I  CD 10 E11.65)  GLUCOPHAGE XR 500 MG XR24H-TAB (METFORMIN HCL) take four tablets by mouth d  aily every morning  ROSUVASTATIN CALCIUM 10 MG TABS (ROSUVASTATIN CALCIUM) take one tablet by m  outh daily  GLIPIZIDE XL 5 MG TB24 (GLIPIZIDE) Take one tablet by mouth once daily  TRAMADOL HCL 50  MG TABS (TRAMADOL HCL) take one tablet daily as needed for   pain  OXYCODONE HCL 5 MG TABS (OXYCODONE HCL) Partial fill upon patient request.    Take one tablet daily as needed for pain >8  CALCIUM CARBONATE-VITAMIN D 600-200 MG-UNIT TABS (CALCIUM CARBONATE-VITAMIN   D) take one tablet twice a day  .  MEDICINE CHANGES:  Changed medication from GLIPIZIDE XL 5 MG TB24 (GLIPIZIDE) Take one tablet   by mouth once daily to GLIPIZIDE ER 2.5 MG XR24H-TAB (GLIPIZIDE) take one t  ablet daily - Signed  Rx of CALCIUM CARBONATE-VITAMIN  D 600-200 MG-UNIT TABS (CALCIUM CARBONATE-V  ITAMIN D) take one tablet twice a day;  #120[Tablet] x 6;  Signed;  Entered   by: [name], MD (JR 5);  Authorized by: [name], MD (JR 5);  Met  hod used: Electronically to [facility] [id number]*, [address] [place]  [place], [place]  [id number], Ph: [phone], Fax: [phone]  Rx of GLIPIZIDE ER 2.5 MG XR24H-TAB (GLIPIZIDE) take one tablet daily;  #60  [Tablet] x 6;  Signed;  Entered by: [name], MD (JR 5);  Authorized by  : [name], MD (JR 5);  Method used: Electronically to [facility] [address]*, [address] [place], [place]  [id number], Ph: [phone], Fax: [phone]  .  ALLERGIES:   No Known Allergies  .  DIRECTIVES:   .  Vitals:   BP: 121 / 76 mmHg Ht: 62.5 in.  Wt: 213.4 lbs.  BMI (in-lb) 38.55  Temp: 99.2deg F.   Pulse Rate: 106 bpm  .  .  Additional PE: Appearance: well appearing  HEENT: normal, PERLA, no thyromegaly, no cervical lyphadenopathy, transluce  nt tympanic membranes bilaterally  CV: regular rate and rhythm; no tachycardia; no murmurs appreciated  Lungs: clear  GU: deferred  Musculoskeletal: normal mass and strength  Passive tone: normal  ROM: full range  Balance: within normal limits  Gait: within normal limits  Motor coordination: adequate  Abnormal movements: none observed  .  .  .  Assessment /T/ Plan:   46F with DM, transaminitis, scleroderma, here for annual exam.  .  #Diabetes: hypoglycemia is concerning;  it is likely due to glipizide. A1c i  s down to 8.  -U/A ordered for microalbumin check  -eye exam - referred to ophtho  -decreased glipizide to 2.5mg   -continue metformin  -continue FS checks  -recheck A1c at next visit  -continue statin - tolerating well  -hold off on ACE-i until development of proteinuria  .  #Obesity/Weight loss: slow, but ongoing progress. Has lost additional 3 lbs  -continue diet and exercise plan  .  #Transaminitis, liver dysfunction  -f/up with Dr. Althea Charon next week  .  #Rheum issues and chronic prednisone use:  Back on prednisione. F/up scheduled with [name] for next week.  Given prednisone use, DEXA in 10/2015 was done and was WNL.  -Already on omeprazole, Ca+Vit D  .  #Hypothyroidism: well controlled, no symptoms. continue current regimen of   levothyroxine.  .  Prevention/Health Maintenance  TDAP administered 11/04/14  Mammogram due 2017  DEXA 10/2015, WNL  Pap smear next in 2018  RTC in the fall for influenza vaccine  Colonoscopy: done 2016, repeat 2017 and both times poor prep but overall ha  s been sufficient for screening purposes.   In combination, these two colon  oscopies have likely given an adequte colon cancer screening. Recommend rep  eat colonoscopy in 3 years.    1 polyp removed, next colonoscopy in 3 yrs (2020)   ***At that time in 2020, will do 7 days of daily miralax. Seven days low fi  ber diet.   Two days clear liquids and a split prep with 2 dulcolax before each dose   of golytely.  .  [name]  * Resident Signature (.sign): .......................................[name], MD (JR 5)  November 24, 2015 4:22 PM  .  .  .  ORDERS:  Microalbumin urine [Q6517X,[id number]]  RTC in 6 months [RTC-180]  Mammo Screening [CPT-77057]  [facility] [Ref-Eye]  [facility] [Ref-Podiatry]  RTC (Return to Clinic) [RTC-000]  Est Prev 40-64yo [phone]) [[id number]]  .  [name]  **Preceptor Note**  Resident: MOULI ([place])  Problem Physical Exam  .  .  Subjective    Patient is a 55 Years Old Female who presents to clinic diabetes doing much   better  a few episodes of hypoglycemia due to not eating regular meals   .  obesity working on it and has lost 3 lbs   .  I discussed the patient with Dr. Marlane Hatcher [facility]) in a routine precepting enc  ounter.  I also personally interviewed and examined the patient.  I agree w  ith the patient's diagnosis and management.   .  Discussed Services Due: Yes  .  Objective   gen: nad, obese  .  Assessment   diabetes: decrease glipizide given hypoglycemia, stressed regular meals  obesity: diet counselling given, exercise counselling given  scleroderma: follow up with [name]  .  [name]  Plan   See resident note for details, Follow-up per resident note  .  .  .  .  Patient Care Plan  .  .  .  .  .  Vaccine Info Sheets   .  [name]  Electronically Signed by [name], MD on 11/25/2015 at 8:22 AM  ________________________________________________________________________

## 2015-11-25 ENCOUNTER — Ambulatory Visit

## 2015-11-26 ENCOUNTER — Ambulatory Visit

## 2015-11-26 NOTE — Progress Notes (Signed)
[facility] November 26, 2015  [address]   [place]  [place] [address]  Main: [phone]  Fax: [phone]  Patient Portal: [email or web address]  [address]  [place], [place]  [address]                                MR#: [id number]                                         DOB:1960/06/02    Dear  [name],      The results of the tests performed during your visit are as follows:    Your urine sample showed negligible amounts of microalbumin. This means that you do are not leaking protein into your urine, which is good. We will continue to monitor your urine regularly, because of your diabetes.      Please call me if you have any questions.        Sincerely,      [name], MD (JR 5)  [facility]  [phone]    ** Please be aware of our new extended hours to better care for you. Hours are: Monday through Thursday from 8 am to 8 pm; Friday from 8 am to 5 pm; Saturday from 8 am to 12 noon.        Created By [name], MD on 11/26/2015 at 12:44 AM    Electronically Signed By [name], MD on 11/26/2015 at 12:44 AM

## 2015-12-03 ENCOUNTER — Ambulatory Visit

## 2015-12-03 ENCOUNTER — Ambulatory Visit: Admitting: Rheumatology
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
## 2015-12-20 ENCOUNTER — Ambulatory Visit

## 2015-12-20 ENCOUNTER — Ambulatory Visit: Admitting: Hand Surgery

## 2015-12-20 NOTE — Progress Notes (Signed)
.  Progress Notes  .  Patient: [name]  Provider: Alfonse Ras  MD  .  DOB: 1961-02-27 Age: 55 Y Sex: Female  Supervising Provider:: Yetta Numbers, MD  Date: 12/20/2015  .  PCP: [name]  MD  Date: 12/20/2015  .  --------------------------------------------------------------------------------  .  REASON FOR APPOINTMENT  .  1. Left index MCPJ pain  .  HISTORY OF PRESENT ILLNESS  .  GENERAL:   55 y/o F w/ scleroderma who has been treated for  synovitis/arthritis of his L index finger MCPJ w serial  injections and good results as well as trigger finger which has  responded to steroids in the past. She presents today w a primary  complaint of recurrence of L index finger MCPJ pain as well as  pain at the site of a ganglion cyst excision on the R wrist. The  pain is present at all times, is worse w motion, and better w  rest. Her rheumatologist has tried medications but these have not  helped.  .  CURRENT MEDICATIONS  .  Taking GLIPIZIDE 5MG  TABLETS 1 tablet daily  Taking Hydroxychloroquine Sulfate 200 mg Tablet 1 tablet with  food or milk Once a day  Taking Ibuprofen  Taking Levothyroxine Sodium 50 MCG Tablet 1 tablet Once a day  Taking METFORMIN TAB 500MG  4 tablets daily  Taking Omeprazole 40 mg Capsule Delayed Release 1 capsule Once a  day  Taking PredniSONE 5 mg Tablet 2 tablet Once a day  Taking Voltaren 1 % Gel 2 grams twice daily  Discontinued Nystatin 100000 unit/gm Cream 1 application to  affected area Twice a day  Discontinued PredniSONE 10 mg Tablet 1 tablet Once a day  Discontinued Flovent HFA 44 MCG/ACT Aerosol 2 puffs Twice a day  .  PAST MEDICAL HISTORY  .  Scleroderma - CREST dx 2007  IgG4 deficiency s/p IVIG 2010 ----single infusion given  preventively after week of bilateral knee replacements at [facility]  2010  Fx left wrist in 1994  Blood clot at LUE in 2006 on a short course of Coumadin  Bilateral carpal tunnel syndrome s/p Lt carpal tunnel release and  steroids injection right--  Bone spur  left foot  Scoliosis  Spinal stenosis s/p steroids injection  s/p bilateral knee replacements for valgus /arthritic  complications, performed by Dr. Katrinka Blazing 2010--- never infected, but  packed with antibiotics with surgery;  Right rotator cuff repairs x 4, complicated by repeat tears,  infection, placement of anchor material  Elevated liver function tests  .  ALLERGIES  .  N.K.D.A.  .  SURGICAL HISTORY  .  Right rotator cuff repair, with infected hardware that had to be  removed 2006  Repeat right shoulder surgery, also which became infected. 2007  Left rotator cuff repair 2008  bilateral knee replacements 2009  Left arthroscopic carpal tunnel release 01/2011  ORIF Left 4th metatarsal bone  Left Ulna shortening 1994  .  FAMILY HISTORY  .  Mother: deceased 38 yrs, lung cancer, hyperthyroidism, diagnosed  with Cancer  Father: deceased 87s yrs, heart attack, diagnosed with Heart  Disease  3 brother(s) .  FH of arthritis and sclerodermaFather deceased from MIMother  deceased age 11 with lung cancer Brother with scleroderma / copd.  .  SOCIAL HISTORY  .  .  Tobacco  history: Never smoked.  [name]  Work/Occupation: Administrator at fitness center.  .  .  Alcohol  Former daily EtOH use in 20s  .  Nonsmoker.Lives with longstanding boyfriend.  [name]  HOSPITALIZATION/MAJOR DIAGNOSTIC PROCEDURE  .  as above  .  REVIEW OF SYSTEMS  .  ORT:  .  Eyes    No . Ear, Nose Throat    No . Digestion, Stomach, Bowel     Yes . Bladder Problems    No . Bleeding Problems    No .  Numbness/Tingling    Yes . Anxiety/Depression    No .  Fever/Chills/Fatigue    No . Chest Pain/Tightness/Palpitations     No . Skin Rash    No . Dental Problems    No . Joint/Muscle  Pain/Cramps    Yes . Blackout/Fainting    No . Other    No .  .  VITAL SIGNS  .  Pain scale 7-8, Ht-in 62, Ht-cm 157.48.  [name]  PHYSICAL EXAMINATION  .  NAD AAOx3well nourished well appearingpleasant affect and  demeanorLUE: pain w passive ROM L index finger MCPJable to make  composite fist w  index 3cm from distal palmar creaseFDP/FDS  intactBCRSILT R/U all digitsRUE: prior surgical scar well  healedinduration over scar w TTPFROM all digits5/5 strength  throughoutSILT R/M/U2+ radial.  .  ASSESSMENTS  .  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  Synovitis of hand - M65.9  .  TREATMENT  .  Inflammatory arthritis  Notes: L index finger MCPJ was injected. We will observe the R  wrist induration/pain for now but if this worsens we will obtain  an ultraound. She will f/u prn. At her next f/u, we will obtain  repeat XR of bilateral hands and wrists.  [name]  PROCEDURES  .  Under sterile technique, the left index finger MCPJ  was injected today with 0.75 cc of betamethasone and 0.25 cc of  1% lidocaine. A bandaid was applied and the usual post injection  instructions were given (20600).  [name]  PROCEDURE CODES  .  20600 ARTHROCENTESIS W/INJ & ASP-SMALL JOINT  .  FOLLOW UP  .  prn w repeat XR bilateral hands/wrists  .  [name]  Appointment Provider: Alfonse Ras  .  Electronically signed by Yetta Numbers , MD on  12/27/2015 at 10:26 AM EDT  .  CONFIRMATORY SIGN OFF  .  [name]  Document electronically signed by Alfonse Ras  MD  .

## 2015-12-20 NOTE — Progress Notes (Signed)
* * *        [name]**    --- ---    16 Y old Female, DOB: February 03, 1961, External MRN: [id number]    Account Number: [id number]    [address] [id number]    Home: [phone]    Guarantor: [name] Insurance: [place]    PCP: [name], MD Referring: Erasmo Leventhal    [facility] Facility[facility]        * * *    12/20/2015   **Appointment Provider:** Alfonse Ras **CHN#:** [id number]    --- ---      **Supervising Provider:** Yetta Numbers, MD    ---        Reason for Appointment    ---      1\. Left index MCPJ pain    ---      History of Present Illness    ---     _GENERAL_ :    55 y/o F w/ scleroderma who has been treated for synovitis/arthritis of his L  index finger MCPJ w serial injections and good results as well as trigger  finger which has responded to steroids in the past. She presents today w a  primary complaint of recurrence of L index finger MCPJ pain as well as pain at  the site of a ganglion cyst excision on the R wrist. The pain is present at  all times, is worse w motion, and better w rest. Her rheumatologist has tried  medications but these have not helped.      Current Medications    ---    Taking     * GLIPIZIDE 5MG  TABLETS 1 tablet daily    ---    * Hydroxychloroquine Sulfate 200 mg Tablet 1 tablet with food or milk Once a day    ---    * Ibuprofen     ---    * Levothyroxine Sodium 50 MCG Tablet 1 tablet Once a day    ---    * METFORMIN TAB 500MG  4 tablets daily    ---    * Omeprazole 40 mg Capsule Delayed Release 1 capsule Once a day    ---    * PredniSONE 5 mg Tablet 2 tablet Once a day    ---    * Voltaren 1 % Gel 2 grams twice daily    ---    Discontinued    * Nystatin 100000 unit/gm Cream 1 application to affected area Twice a day    ---    * PredniSONE 10 mg Tablet 1 tablet Once a day    ---    * Flovent HFA 44 MCG/ACT Aerosol 2 puffs Twice a day    ---      Past Medical History    ---       Scleroderma - CREST dx 2007.        ---    IgG4  deficiency s/p IVIG 2010 ----single infusion given preventively after  week of bilateral knee replacements at [facility] 2010.        ---    Fx left wrist in 1994.        ---    Blood clot at LUE in 2006 on a short course of Coumadin.        ---    Bilateral carpal tunnel syndrome s/p Lt carpal tunnel release and steroids  injection right--.        ---  Bone spur left foot.        ---    Scoliosis.        ---    Spinal stenosis s/p steroids injection.        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 Katrinka Blazing 2010--- never infected, but packed with antibiotics with  surgery;.        ---    Right rotator cuff repairs x 4, complicated by repeat tears, infection,  placement of anchor material.        ---    Elevated liver function tests.        ---      Surgical History    ---      Right rotator cuff repair, with infected hardware that had to be removed  2006    ---    Repeat right shoulder surgery, also which became infected. 2007    ---    Left rotator cuff repair 2008    ---    bilateral knee replacements 2009    ---    Left arthroscopic carpal tunnel release 01/2011    ---    ORIF Left 4th metatarsal bone    ---    Left Ulna shortening 1994    ---      Family History    ---      Mother: deceased 58 yrs, lung cancer, hyperthyroidism, diagnosed with Cancer    ---    Father: deceased 92s yrs, heart attack, diagnosed with Heart Disease    ---    3 brother(s) .    ---    FH of arthritis and scleroderma    Father deceased from MI    Mother deceased age 58 with lung cancer    Brother with scleroderma / copd.    ---      Social History    ---    Tobacco history: Never smoked.    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    Alcohol    _Former daily EtOH use in 20s_   Nonsmoker.    Lives with longstanding boyfriend.    ---      Allergies    ---      N.K.D.A.    ---      Hospitalization/Major Diagnostic Procedure    ---      as above    ---      Review of Systems    ---      _ORT_ :    Eyes No. Ear, Nose Throat No. Digestion, Stomach, Bowel Yes. Bladder Problems  No. Bleeding Problems No. Numbness/Tingling Yes. Anxiety/Depression No.  Fever/Chills/Fatigue No. Chest Pain/Tightness/Palpitations No. Skin Rash No.  Dental Problems No. Joint/Muscle Pain/Cramps Yes. Blackout/Fainting No. Other  No.          Vital Signs    ---    Pain scale 7-8, Ht-in 62, Ht-cm 157.48.      Physical Examination    ---    NAD AAOx3    well nourished well appearing    pleasant affect and demeanor    LUE: pain w passive ROM L index finger MCPJ    able to make composite fist w index 3cm from distal palmar crease    FDP/FDS intact    BCR    SILT R/U all digits    RUE: prior surgical scar well healed    induration over scar w TTP    FROM all digits  5/5 strength throughout    SILT R/M/U    2+ radial.      Assessments    ---    1\. 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 Synovitis of hand - M65.9    ---      Treatment    ---       **1\. Inflammatory arthritis**    Notes: L index finger MCPJ was injected. We will observe the R wrist  induration/pain for now but if this worsens we will obtain an ultraound. She  will f/u prn. At her next f/u, we will obtain repeat XR of bilateral hands and  wrists.    ---      Procedures    ---    Under sterile technique, the left index finger MCPJ was injected today with  0.75 cc of betamethasone and 0.25 cc of 1% lidocaine. A bandaid was applied  and the usual post injection instructions were given (20600).      Procedure Codes    ---      20600 ARTHROCENTESIS W/INJ & ASP-SMALL JOINT    ---      Follow Up    ---    prn w repeat XR bilateral hands/wrists    **Appointment Provider:** Alfonse Ras    Electronically signed by Yetta Numbers , MD on 12/27/2015 at 10:26 AM EDT    Sign off status: Completed        * * *        [facility]    [address]    [place], [place] [address]    Tel: [phone]    Fax: [phone]              * * *           Patient: [name] DOB: 1960/04/13 Progress Note: Alfonse Ras  12/20/2015    ---    Note generated by eClinicalWorks EMR/PM Software ([email or web address])

## 2016-01-13 ENCOUNTER — Ambulatory Visit

## 2016-01-13 NOTE — Telephone Encounter (Signed)
PHONE NOTE     CALL INFORMATION:   Reason for call: Other  Person Calling: Terris  Relationship to pt: Pt  PCP: Mouli      CALL DETAILS:   [name] has an appointment in rheumatology tomorrow. She was told by the clinic that she neds a referal. I took a look and it appears that she does have one, but she wants to still pass that message onto you incase there is something else that needs to be done.   Call Taken by: ......................................Marland KitchenLajuana Carry  January 13, 2016 9:28 AM          RESPONSE/ORDERS:    Leitha Bleak,   ......................................Marland KitchenEwa Chomicki  January 13, 2016 2:14 PM    Clayborn Heron can  you call rheumatology and make sure we are not missing anything. Im pretty sure she doesn't need a referral due that PCP is here at [facility]. Thanks ......................................Marland KitchenGabriela Biasini  January 13, 2016 3:07 PM    correct, all set  ......................................Marland KitchenEwa Chomicki  January 13, 2016 4:12 PM                 ORDERS/PROBS/MEDS/ALL     Problems:   ANNUAL EXAM (ICD-V72.31) (ICD10-Z00.00)  MAMMOGRAPHIC SCREENING FOR BREAST CANCER (ICD-V76.12) (ICD10-Z12.31)  DIABETES MELLITUS, TYPE II (ICD-250.00) (ICD10-E11.9)  LONG-TERM (CURRENT) USE OF STEROIDS (ICD-V58.65) (ICD10-Z79.51)  OSTEOPOROSIS SCREENING (ICD-V82.81) (ICD10-Z13.820)  TRANSAMINASES, SERUM, ELEVATED (ICD-790.4) (ICD10-R74.0)  OBESITY (ICD-278.00) (ICD10-E66.9)      DIABETES MELLITUS, TYPE II, UNCONTROLLED (ICD-250.02) (ICD10-E11.65)      FATTY LIVER DISEASE (ICD-571.8) (ICD10-K76.0)  ? 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) (ICD10-D14.30)  CARPAL TUNNEL (ICD-354.0) (ICD10-G56.00)  OSTEOARTHRITIS (ICD-715.09) (ICD10-M15.9)  S/P ROTATOR CUFF SURGERY 12/2004 - RT SHOULDER; 2008 LT (ICD-V45.89)  Family Hx of MELANOMA, FAMILY HX (ICD-V16.8) (ICD10-Z80.8)  FRACTURE OF OTHER SPEC SITE,  PATHOLOGIC - MULTIPLE (ICD-733.19)  CHOLECYSTECTOMY AND HERNIA REPAIR (ICD-V45.89)  VITAMIN D DEFICIENCY (ICD-268.9) (ICD10-E55.9)  VACCINATION WITH TDAP (ICD-V06.8) ([id number])  COLONOSCOPY (ICD-V76.51) (ICD10-Z01.89)    Meds (prior to this call):   PREDNISONE 10 MG TABS (PREDNISONE) TK 1 T PO  ONCE Daily  LEVOTHYROXINE SODIUM 25 MCG TABS (LEVOTHYROXINE SODIUM) Take one tablet by mouth once daily  OMEPRAZOLE 20 MG CPDR (OMEPRAZOLE) Take one capsule by mouth twice a day  IBUPROFEN 600 MG TABS (IBUPROFEN) 1 tab by mouth TID as needed for pain  BACLOFEN 10 MG TABS (BACLOFEN) Please take half to one tablet as needed for back spasms  DICLOFENAC SODIUM 1 % GEL (DICLOFENAC SODIUM) APP 2 GRAMS EXT AA BID  PROAIR HFA 108 (90 BASE) MCG/ACT AERS (ALBUTEROL SULFATE) Inhale 2 puffs 4 times a day as needed for wheezing  LOTRISONE 1-0.05 % CREA (CLOTRIMAZOLE-BETAMETHASONE) apply to affected area on ankle twice a day      FREESTYLE FREEDOM LITE W/DEVICE KIT (BLOOD GLUCOSE MONITORING SUPPL) use as directed (ICD 10 E11.65)      FREESTYLE LITE TEST STRP (GLUCOSE BLOOD) check fingerstick three times a day (ICD 10 E11.65)      FREESTYLE LANCETS MISC (LANCETS) check fingerstick three times a day (ICD 10 E11.65)  GLUCOPHAGE XR 500 MG XR24H-TAB (METFORMIN HCL) take four tablets by mouth daily  every morning  ROSUVASTATIN CALCIUM 10 MG TABS (ROSUVASTATIN CALCIUM) take one tablet by mouth daily  GLIPIZIDE ER 2.5 MG XR24H-TAB (GLIPIZIDE) take one tablet daily  TRAMADOL HCL 50 MG TABS (TRAMADOL HCL) take one tablet daily as needed for pain  OXYCODONE HCL 5 MG TABS (OXYCODONE HCL) Partial fill upon patient request.  Take one tablet daily as needed for pain  >8  CALCIUM CARBONATE-VITAMIN D 600-200 MG-UNIT TABS (CALCIUM CARBONATE-VITAMIN D) take one tablet twice a day          Created By Lajuana Carry on 01/13/2016 at 09:26 AM    Electronically Signed By Everlene Farrier on 01/13/2016 at 04:13 PM

## 2016-01-14 ENCOUNTER — Ambulatory Visit: Admitting: Rheumatology

## 2016-01-14 ENCOUNTER — Ambulatory Visit

## 2016-01-14 NOTE — Progress Notes (Signed)
* * *        [name]**    --- ---    55 Y old Female, DOB: [birth year], External MRN: [id number]    Account Number: [id number]    [address] [id number]    Home: [phone]    Guarantor: [name] Insurance: [place]P [place]    PCP: [name], MD Referring: Fortunato Curling, MD    Appointment Facility: Rheumatology        * * *    01/14/2016  Progress Notes: [name], MD **CHN#:** [id number]    --- ---    ---        Reason for Appointment    ---      1\. FU/HAND SWELLING    ---      History of Present Illness    ---     _GENERAL_ :    She is still experiencing bilateral hand pain in her PIP and MCP joints. The  pain improved after steroids and she cut back, then it all came right back.  Now increased doses of steroids have not helped very much. She denies any  fevers chills or recent infections. No other joint pains.      Current Medications    ---    Taking     * GLIPIZIDE 5MG  TABLETS 1 tablet daily    ---    * Hydroxychloroquine Sulfate 200 mg Tablet 1 tablet with food or milk Orally Once a day    ---    * Levothyroxine Sodium 50 MCG Tablet 1 tablet Orally Once a day    ---    * METFORMIN TAB 500MG  4 tablets daily    ---    * Omeprazole 40 mg Capsule Delayed Release 1 capsule Orally Once a day    ---    * PredniSONE 5 mg Tablet 2 tablet Orally Once a day    ---    * Voltaren 1 % Gel 2 grams Transdermal twice daily, Notes: PRN    ---    Not-Taking/PRN    * Ibuprofen     ---    * Medication List reviewed and reconciled with the patient    ---      Past Medical History    ---       Scleroderma - CREST dx 2007.        ---    IgG4 deficiency s/p IVIG 2010 ----single infusion given preventively after  week of bilateral knee replacements at [facility] 2010.        ---    Fx left wrist in 1994.        ---    Blood clot at LUE in 2006 on a short course of Coumadin.        ---    Bilateral carpal tunnel syndrome s/p Lt carpal tunnel release and steroids  injection right--.        ---    Bone spur left  foot.        ---    Scoliosis.        ---    Spinal stenosis s/p steroids injection.        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 Katrinka Blazing 2010--- never infected, but packed with antibiotics with  surgery;.        ---    Right rotator cuff repairs x 4, complicated by repeat tears, infection,  placement of anchor material.        ---    Elevated liver function tests.        ---      Family History    ---      Mother: deceased 52 yrs, lung cancer, hyperthyroidism, diagnosed with Cancer    ---    Father: deceased 90s yrs, heart attack, diagnosed with Heart Disease    ---    3 brother(s) .    ---    FH of arthritis and scleroderma    Father deceased from MI    Mother deceased age 21 with lung cancer    Brother with scleroderma / copd.    ---      Social History    ---    Tobacco history: Never smoked.    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    Alcohol  Former daily EtOH use in 20s.   Nonsmoker.    Lives with longstanding boyfriend.    ---      Allergies    ---      N.K.D.A.    ---      Review of Systems    ---     _ADULT Rheumatology ROS_ :    Constitutional No Recent weight gain, Fatigue, Chills. Eyes No Pain, Loss of  vision, Itching eyes. HENT No Headache, Loss of hearing, Sores in mouth.  Respiratory No Shortness of breath, Cough, Hemoptysis. Gastrointestinal No  Nausea, Heartburn, Diarrhea. Genitourinary No Difficult urination, Pus in  urine, Blood in urine. Musculoskeletal **+ Joint pain, Joint swelling, Muscle  Pain**. Integumentary (skin and/or breast) No Easy bruising, Rash, Sun  sensitive (sun allergy). Neurological System **+Numbness or Tingling**.  Psychiatric No Excessive worries, Depression, Night sweats. Endocrine No  Excessive thirst. Hematologic/Lymphatic No Swollen glands, Anemia,  Lymphadenopathy. Allergic/Immunologic No Frequent sneezing, Increased  susceptibility to infection, Raynauds. Heart No Chest Pain, Heart murmur,  Irregular  heart beat.          Vital Signs    ---    Pain scale 7, Ht-in 62, Wt-lbs 222, BMI 40.60, BP 138/85, HR 97, BSA 2.10, Ht-  cm 157.48, Wt-kg 100.7, Wt Change 6 lb.      Examination    ---     _RAPID 3_ :    Function (0-10): 0.        Pain (0-10): 7.0.        Patient Global (0-10): 0.    _Rheumatology_ :    General Appearance Alert and oriented , No apparent distress .    HENT: No, alopecia .    Eyes: No, scleral icterus, scleral erythema .    CV: regular rate rhythm .    Pulm: clear to auscultation bilaterally .    Ext: trace LE edema .    Skin: No, rash .    Neuro: mildly positive Phalen's test .    Psych: No anxiety depression.    Muskuloskeletal Severe synovitis in the left PIPs and MCPS of the hand, mild  on the right. Mild synovial thickening in the wrists. Elbows, shoulders, hips,  knees have intact ROM with no synovitis. .    Skin and Nails Minimal sclerodactyly.          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 Elevated liver enzymes - R74.8    ---  She still has severe synovitis. 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 There is also significant risk of infection due  to her IgG4 deficiency. She has not required IVIG for a long time. We  discussed the risks and benefits of various therapies and settled on a trial  of enbrel. We settled on this because of its frequent dosing schedule,  allowing [place] to more readily discontinue it if she gets infected. We discussed  the importance of hand hygiene and avoiding obviously sick contacts. She  understands and accepts the risk. We will complete the PA and proceed with  starting therapy. Also will verify TB status. With respect to steroids, I  recommended she stay at her current dose until the enbrel starts working, 4-6  weeks from now.    ---      Treatment    ---       **1\. Seronegative rheumatoid arthritis**    Continue Hydroxychloroquine  Sulfate Tablet, 200 mg, 1 tablet with food or  milk, Orally, Once a day    Continue Omeprazole Capsule Delayed Release, 40 mg, 1 capsule, Orally, Once a  day    Continue PredniSONE Tablet, 5 mg, 2 tablet, Orally, Once a day    ---         **2\. Others**    Start Enbrel SureClick Solution Auto-injector, 50 MG/ML, inject 50mg  (1 pen)  sc, Subcutaneous, every week, 28 days, 4, Refills 3      Follow Up    ---    2 Months    Electronically signed by Erasmo Leventhal , MD on 01/21/2016 at 09:35 AM EDT    Sign off status: Completed        * * *        Rheumatology    [address]    [place], [place] [address]    Tel: [phone]    Fax: [phone]              * * *          Patient: MEIYA, WISLER DOB: Oct 13, 1960 Progress Note: [name], MD  01/14/2016    ---    Note generated by eClinicalWorks EMR/PM Software ([email or web address])

## 2016-01-14 NOTE — Progress Notes (Signed)
.  Progress Notes  .  Patient: [name], [name]  Provider: [name]  DOB: 1960/11/02 Age: 55 Y Sex: Female  .  PCP: [name]  MD  Date: 01/14/2016  .  --------------------------------------------------------------------------------  .  REASON FOR APPOINTMENT  .  1. FU/HAND SWELLING  .  HISTORY OF PRESENT ILLNESS  .  GENERAL:   She is still experiencing bilateral hand pain in her PIP and MCP  joints. The pain improved after steroids and she cut back, then  it all came right back. Now increased doses of steroids have not  helped very much. She denies any fevers chills or recent  infections. No other joint pains.  .  CURRENT MEDICATIONS  .  Taking GLIPIZIDE 5MG  TABLETS 1 tablet daily  Taking Hydroxychloroquine Sulfate 200 mg Tablet 1 tablet with  food or milk Orally Once a day  Taking Levothyroxine Sodium 50 MCG Tablet 1 tablet Orally Once a  day  Taking METFORMIN TAB 500MG  4 tablets daily  Taking Omeprazole 40 mg Capsule Delayed Release 1 capsule Orally  Once a day  Taking PredniSONE 5 mg Tablet 2 tablet Orally Once a day  Taking Voltaren 1 % Gel 2 grams Transdermal twice daily, Notes:  PRN  Not-Taking/PRN Ibuprofen  Medication List reviewed and reconciled with the patient  .  PAST MEDICAL HISTORY  .  Scleroderma - CREST dx 2007  IgG4 deficiency s/p IVIG 2010 ----single infusion given  preventively after week of bilateral knee replacements at [facility]  2010  Fx left wrist in 1994  Blood clot at LUE in 2006 on a short course of Coumadin  Bilateral carpal tunnel syndrome s/p Lt carpal tunnel release and  steroids injection right--  Bone spur left foot  Scoliosis  Spinal stenosis s/p steroids injection  s/p bilateral knee replacements for valgus /arthritic  complications, performed by Dr. Katrinka Blazing 2010--- never infected, but  packed with antibiotics with surgery;  Right rotator cuff repairs x 4, complicated by repeat tears,  infection, placement of anchor material  Elevated liver function  tests  .  ALLERGIES  .  N.K.D.A.  [name]  FAMILY HISTORY  .  Mother: deceased 57 yrs, lung cancer, hyperthyroidism, diagnosed  with Cancer  Father: deceased 68s yrs, heart attack, diagnosed with Heart  Disease  3 brother(s) .  FH of arthritis and sclerodermaFather deceased from MIMother  deceased age 80 with lung cancer Brother with scleroderma / copd.  .  SOCIAL HISTORY  .  .  Tobacco  history: Never smoked.  .  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  .  Alcohol Former daily EtOH use in 20s.  .  Nonsmoker.Lives with longstanding boyfriend.  [name]  REVIEW OF SYSTEMS  .  ADULT Rheumatology ROS :  .  Constitutional    No Recent weight gain, Fatigue, Chills . Eyes     No Pain, Loss of vision, Itching eyes . HENT    No Headache,  Loss of hearing, Sores in mouth . Respiratory    No Shortness of  breath, Cough, Hemoptysis . Gastrointestinal    No Nausea,  Heartburn, Diarrhea . Genitourinary    No Difficult urination,  Pus in urine, Blood in urine . Musculoskeletal    + Joint pain,  Joint swelling, Muscle Pain . Integumentary (skin and/or breast)     No Easy bruising, Rash, Sun sensitive (sun allergy) .  Neurological System    +Numbness or Tingling . Psychiatric    No  Excessive worries,  Depression, Night sweats . Endocrine    No  Excessive thirst . Hematologic/Lymphatic    No Swollen glands,  Anemia, Lymphadenopathy . Allergic/Immunologic    No Frequent  sneezing, Increased susceptibility to infection, Raynauds . Heart     No Chest Pain, Heart murmur, Irregular heart beat .  [name]  VITAL SIGNS  .  Pain scale 7, Ht-in 62, Wt-lbs 222, BMI 40.60, BP 138/85, HR 97,  BSA 2.10, Ht-cm 157.48, Wt-kg 100.7, Wt Change 6 lb.  [name]  EXAMINATION  .  RAPID 3: Function(0-10): 0. Pain(0-10):7.0. Patient  Global(0-10):0.  [name]  Rheumatology:  General AppearanceAlert and oriented , No apparent distress .  HENT:No, alopecia .  Eyes:No, scleral icterus, scleral erythema .  YN:WGNFAOZ rate rhythm .  Pulm:clear to auscultation bilaterally .  HYQ:MVHQI  LE edema .  Skin:No, rash .  Neuro:mildly positive Phalen's test .  Psych:No anxiety depression.  MuskuloskeletalSevere synovitis in the left PIPs and MCPS of the  hand, mild on the right. Mild synovial thickening in the wrists.  Elbows, shoulders, hips, knees have intact ROM with no synovitis.  .  Skin and NailsMinimal sclerodactyly.  .  ASSESSMENTS  .  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  Elevated liver enzymes - R74.8  .  She still has severe synovitis. 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 There is also significant  risk of infection due to her IgG4 deficiency. She has not  required IVIG for a long time. We discussed the risks and  benefits of various therapies and settled on a trial of enbrel.  We settled on this because of its frequent dosing schedule,  allowing [place] to more readily discontinue it if she gets infected.  We discussed the importance of hand hygiene and avoiding  obviously sick contacts. She understands and accepts the risk. We  will complete the PA and proceed with starting therapy. Also will  verify TB status. With respect to steroids, I recommended she  stay at her current dose until the enbrel starts working, 4-6  weeks from now.  .  TREATMENT  .  Seronegative rheumatoid arthritis  Continue Hydroxychloroquine Sulfate Tablet, 200 mg, 1 tablet with  food or milk, Orally, Once a day  Continue Omeprazole Capsule Delayed Release, 40 mg, 1 capsule,  Orally, Once a day  Continue PredniSONE Tablet, 5 mg, 2 tablet, Orally, Once a day  .  [name]  Others  Start Enbrel SureClick Solution Auto-injector, 50 MG/ML, inject  50mg  (1 pen) sc, Subcutaneous, every week, 28 days, 4, Refills 3  .  FOLLOW UP  .  2 Months  .  Electronically signed by Erasmo Leventhal , MD on  01/21/2016 at 09:35 AM EDT  .  Document electronically signed by [name]

## 2016-01-17 ENCOUNTER — Ambulatory Visit

## 2016-01-17 NOTE — Telephone Encounter (Signed)
PHONE NOTE     CALL INFORMATION:   Reason for call: Info/MD only, Other  Person Calling: Damiya Sandefur  Relationship to pt: Pt  PCP: Dr. Marlane Hatcher  Time/Date of call: 01/17/2016  Day Phone #: [phone]      CALL DETAILS:   Patient prefers a call back from Dr. Marlane Hatcher today when possible regarding patient needs assistance for schedule an sooner appointment with Dr. [name] at orthopedics clinic.  Patient's contact number is [phone].    Thank you,  Call Taken by: ......................................Marland KitchenCheri Guppy  January 17, 2016 12:19 PM          RESPONSE/ORDERS:    spoke with pt  she tore her rotator cuff yesterday - L shoulder - has had multiple surgeries in the past   is sure that she has a tear as she has had multiple surgeries with Dr [name]...    has appt on 10/27 would like to be seen sooner if possible  will ask Ewa to call to see if he can see her sooner. .......................................Marland KitchenClayborne Artist, RN  January 17, 2016 1:17 PM    Clayborn Heron, can you book her in resident clinic on Wednesday or urgent care with someone tomorrow?   Thanks, Joe    I called her but she said she doesn't want to be seen by PCP. She will call Ortho to check for cancellations. Dr. [name] sees patients only on Mon and Fri  ......................................Marland KitchenEwa Chomicki  January 18, 2016 1:24 PM    called and left voicemail checking in about whether she got ortho appt. asked her to call me back to let me know. .......................................[name], MD (JR 5)  January 19, 2016 3:08 PM             ORDERS/PROBS/MEDS/ALL     Problems:   ANNUAL EXAM (ICD-V72.31) (ICD10-Z00.00)  MAMMOGRAPHIC SCREENING FOR BREAST CANCER (ICD-V76.12) (ICD10-Z12.31)  DIABETES MELLITUS, TYPE II (ICD-250.00) (ICD10-E11.9)  LONG-TERM (CURRENT) USE OF STEROIDS (ICD-V58.65) (ICD10-Z79.51)  OSTEOPOROSIS SCREENING (ICD-V82.81) (ICD10-Z13.820)  TRANSAMINASES, SERUM, ELEVATED (ICD-790.4) (ICD10-R74.0)  OBESITY (ICD-278.00) (ICD10-E66.9)       DIABETES MELLITUS, TYPE II, UNCONTROLLED (ICD-250.02) (ICD10-E11.65)      FATTY LIVER DISEASE (ICD-571.8) (ICD10-K76.0)  ? 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)  CARPAL TUNNEL (ICD-354.0) (ICD10-G56.00)  OSTEOARTHRITIS (ICD-715.09) (ICD10-M15.9)  S/P ROTATOR CUFF SURGERY 12/2004 - RT SHOULDER; 2008 LT (ICD-V45.89)  Family Hx of MELANOMA, FAMILY HX (ICD-V16.8) (ICD10-Z80.8)  FRACTURE OF OTHER SPEC SITE,  PATHOLOGIC - MULTIPLE (ICD-733.19)  CHOLECYSTECTOMY AND HERNIA REPAIR (ICD-V45.89)  VITAMIN D DEFICIENCY (ICD-268.9) (ICD10-E55.9)  VACCINATION WITH TDAP (ICD-V06.8) (MWN02-V25)  COLONOSCOPY (ICD-V76.51) (ICD10-Z01.89)    Meds (prior to this call):   PREDNISONE 10 MG TABS (PREDNISONE) TK 1 T PO  ONCE Daily  LEVOTHYROXINE SODIUM 25 MCG TABS (LEVOTHYROXINE SODIUM) Take one tablet by mouth once daily  OMEPRAZOLE 20 MG CPDR (OMEPRAZOLE) Take one capsule by mouth twice a day  IBUPROFEN 600 MG TABS (IBUPROFEN) 1 tab by mouth TID as needed for pain  BACLOFEN 10 MG TABS (BACLOFEN) Please take half to one tablet as needed for back spasms  DICLOFENAC SODIUM 1 % GEL (DICLOFENAC SODIUM) APP 2 GRAMS EXT AA BID  PROAIR HFA 108 (90 BASE) MCG/ACT AERS (ALBUTEROL SULFATE) Inhale 2 puffs 4 times a day as needed for wheezing  LOTRISONE 1-0.05 % CREA (CLOTRIMAZOLE-BETAMETHASONE) apply to affected area on ankle twice a day      FREESTYLE FREEDOM LITE W/DEVICE KIT (BLOOD GLUCOSE MONITORING SUPPL) use as directed (ICD 10 E11.65)      FREESTYLE LITE TEST STRP (GLUCOSE BLOOD) check fingerstick three times  a day (ICD 10 E11.65)      FREESTYLE LANCETS MISC (LANCETS) check fingerstick three times a day (ICD 10 E11.65)  GLUCOPHAGE XR 500 MG XR24H-TAB (METFORMIN HCL) take four tablets by mouth daily every morning  ROSUVASTATIN CALCIUM 10 MG TABS (ROSUVASTATIN CALCIUM) take one tablet by mouth daily  GLIPIZIDE ER 2.5 MG XR24H-TAB (GLIPIZIDE) take one tablet daily  TRAMADOL HCL 50 MG TABS (TRAMADOL HCL) take one tablet daily as needed for pain  OXYCODONE HCL 5 MG TABS (OXYCODONE HCL) Partial fill upon patient request.  Take one tablet daily as needed for pain >8  CALCIUM CARBONATE-VITAMIN D 600-200 MG-UNIT TABS (CALCIUM CARBONATE-VITAMIN D) take one tablet twice a day          Created By Cheri Guppy on 01/17/2016 at 12:14 PM    Electronically Signed By [name], MD on 01/19/2016 at 03:34 PM

## 2016-01-18 ENCOUNTER — Ambulatory Visit: Admitting: Rheumatology

## 2016-01-18 NOTE — Progress Notes (Signed)
* * *        **  [name]**    --- ---    27 Y old Female, DOB: April 21, 1960    [address] [place], [place] [address]    Home: [phone]    Provider: [name]        * * *    Telephone Encounter    ---    Answered by   Charlies Constable  Date: 01/18/2016         Time: 04:21 PM    Caller   [name]    --- ---            Reason   NO PA NEEDED: ENBREL            Message                      PA is not required for Enbrel. Medication is restricted to [facility]. Please send a script to the pharmacy. Thanks!                Action Taken   Solomon,[name] 01/18/2016 4:22:37 PM > Lee,Jinkyu , [name]  01/19/2016 2:06:15 PM > Thank you. Sent to [facility]. Left a VM w/ pt  stating that it's restricted to [facility].            Refills  Refill Enbrel SureClick Solution Auto-injector, 50 MG/ML,  Subcutaneous, 4, inject 50mg  (1 pen) sc, every week, 28 days, Refills=3    --- ---          * * *                ---          * * *          PatientDenny [name], Veleka DOB: [birth year] Provider: [name] 01/18/2016    ---    Note generated by eClinicalWorks EMR/PM Software ([email or web address])

## 2016-01-24 ENCOUNTER — Ambulatory Visit

## 2016-01-24 ENCOUNTER — Ambulatory Visit: Admitting: Hand Surgery

## 2016-01-24 LAB — HX CHEM-PANELS
HX GFR, AFRICAN AMERICAN, WHOLE BLOOD: >=90 mL/min/{1.73_m2}
HX GFR, NON- AFRICAN AMERICAN, WHOLE BLOOD: >=90 mL/min/{1.73_m2}

## 2016-01-24 LAB — HX CHEM-BLOODGAS: HX WHOLE BLOOD CREATININE: 0.7 mg/dL (ref 0.4–1.3)

## 2016-01-24 NOTE — Progress Notes (Signed)
* * *        [name]**    --- ---    55 Y old Female, DOB: April 10, 1960, External MRN: [id number]    Account Number: [id number]    [address] [name], [id number]    Home: [phone]    Guarantor: [name] Insurance: [place]    PCP: [name] Forts, MD Referring: [name] Curling, MD    Appointment Facility: [facility]        * * *    01/24/2016   **Appointment Provider:** [name] Sander, PA **CHN#:**  [id number]    --- ---      **Supervising Provider:** [name] Numbers, MD    ---        Reason for Appointment    ---      1\. Left shoulder pain    ---      History of Present Illness    ---     _GENERAL_ :    [name] is a 55 year old right-hand dominant female who presents for evaluation  of left shoulder pain. She was working on her stairs a week ago, Sunday, and  used her left arm to move over, feeling something tear across the deltoid  area. She had been seen by her rheumatologist the previous Friday for some  upper back pain and had been prescribed PT. Since then, she has been taking  tramadol and baclofen that is prescribed by her PCP for pain with minimal  relief. She has limited range of motion of the shoulder currently, unable to  fully extend over head or to the side. Prior to feeling it something tear, she  had full shoulder range of motion and no pain. She denies numbness or tingling  of the upper extremity.      Current Medications    ---    Taking     * Baclofen 10 MG Tablet 1/2 tab Orally prn    ---    * Enbrel SureClick 50 MG/ML Solution Auto-injector inject 50mg  (1 pen) sc Subcutaneous every week    ---    * GLIPIZIDE 5MG  TABLETS 1 tablet daily    ---    * Hydroxychloroquine Sulfate 200 mg Tablet 1 tablet with food or milk Orally Once a day    ---    * Levothyroxine Sodium 50 MCG Tablet 1 tablet Orally Once a day    ---    * METFORMIN TAB 500MG  4 tablets daily    ---    * Omeprazole 40 mg Capsule Delayed Release 1 capsule Orally Once a day    ---    * PredniSONE  5 mg Tablet 2 tablet Orally Once a day    ---    * Tramadol HCl 50 MG Tablet 1 tablet Orally every 6 hrs    ---    * Voltaren 1 % Gel 2 grams Transdermal twice daily, Notes: PRN    ---    * Medication List reviewed and reconciled with the patient    ---      Past Medical History    ---       Scleroderma - CREST dx 2007.        ---    IgG4 deficiency s/p IVIG 2010 ----single infusion given preventively after  week of bilateral knee replacements at [facility] 2010.        ---    Fx left wrist in 1994.        ---  Blood clot at LUE in 2006 on a short course of Coumadin.        ---    Bilateral carpal tunnel syndrome s/p Lt carpal tunnel release and steroids  injection right--.        ---    Bone spur left foot.        ---    Scoliosis.        ---    Spinal stenosis s/p steroids injection.        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 Katrinka Blazing 2010--- never infected, but packed with antibiotics with  surgery;.        ---    Right rotator cuff repairs x 4, complicated by repeat tears, infection,  placement of anchor material.        ---    Elevated liver function tests.        ---      Surgical History    ---      Right rotator cuff repair, with infected hardware that had to be removed  2006    ---    Repeat right shoulder surgery, also which became infected. 2007    ---    Left rotator cuff repair 2008    ---    bilateral knee replacements 2009    ---    Left arthroscopic carpal tunnel release 01/2011    ---    ORIF Left 4th metatarsal bone    ---    Left Ulna shortening 1994    ---      Family History    ---      Mother: deceased 50 yrs, lung cancer, hyperthyroidism, diagnosed with Cancer    ---    Father: deceased 25s yrs, heart attack, diagnosed with Heart Disease    ---    3 brother(s) .    ---    FH of arthritis and scleroderma    Father deceased from MI    Mother deceased age 72 with lung cancer    Brother with scleroderma / copd.    ---      Social History    ---     Tobacco history: Never smoked.    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    Alcohol    _Former daily EtOH use in 20s_   Nonsmoker.    Lives with longstanding boyfriend.    ---      Allergies    ---      N.K.D.A.    ---      Hospitalization/Major Diagnostic Procedure    ---      as above    ---      Review of Systems    ---     _ORT_ :    Eyes No. Ear, Nose Throat No. Digestion, Stomach, Bowel No. Bladder Problems  No. Bleeding Problems No. Numbness/Tingling Yes. Anxiety/Depression No.  Fever/Chills/Fatigue No. Chest Pain/Tightness/Palpitations No. Skin Rash No.  Dental Problems No. Joint/Muscle Pain/Cramps Yes. Blackout/Fainting No. Other  No.          Vital Signs    ---    Pain scale 10, Ht-in 62.      Physical Examination    ---    Well-appearing female in no acute distress. On examination of the left upper  extremity, skin is intact. There is no swelling or bony deformity. No  tenderness of the clavicle, biceps tendon, or posterior glenohumeral joint.  She has focal tenderness of the AC joint and tenderness over the periscapular  area where there is muscle spasm appreciated. She is tender along the belly of  the deltoid. Active forward elevation 80 degrees, passively improves to 160  though was painful. She is able to hold the shoulder 160 without resistance.  External rotation 30 degrees and internal rotation to the upper lumbar spine  within 1 level of the contralateral side. She gives good resistance with  subscap and infraspinatus testing. Very limited strength with supraspinatus  and deltoid testing. She has significant pain with supraspinatus testing.  Sensation intact to light touch in the axillary, radial, median, and ulnar  nerve distributions. Hand is warm and well perfused with 2+ radial pulse.    IMAGING: Radiographs of the left shoulder obtained today demonstrate no  evidence of acute fracture. The humeral head rides high in the socket, which  was also seen previously on x-rays in 2016.  Suture anchors are visualized.      Assessments    ---    1\. Acute pain of left shoulder - M25.512 (Primary)    ---      Treatment    ---       **1\. Others**    Notes: The patient was seen and evaluated with Dr. [name]. X-rays are  reviewed demonstrating evidence of rotator cuff arthropathy. Given her injury  and symptoms, we have a high suspicion for retear of her rotator cuff. We will  order an MRI to confirm this. She will follow up afterwards to discuss the  results and treatment plan. All questions were answered.    CC: [name], D.O.    ---      Follow Up    ---    after MRI    **Appointment Provider:** [name] Sander, PA    Electronically signed by [name] , MD on 01/29/2016 at 01:14 PM EDT    Sign off status: Completed        * * *        [facility]    [address]    [place], [place] [address]    Tel: [phone]    Fax: [phone]              * * *          Patient: SHANDALE, MALAK DOB: [birth year] Progress Note: [name] Sander,  PA 01/24/2016    ---    Note generated by eClinicalWorks EMR/PM Software ([email or web address])
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
## 2016-01-25 ENCOUNTER — Ambulatory Visit
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
## 2016-01-27 ENCOUNTER — Ambulatory Visit: Admitting: Hand Surgery

## 2016-01-27 ENCOUNTER — Ambulatory Visit

## 2016-01-28 ENCOUNTER — Ambulatory Visit

## 2016-01-28 ENCOUNTER — Ambulatory Visit: Admitting: Rheumatology

## 2016-01-28 ENCOUNTER — Ambulatory Visit: Admitting: Hand Surgery
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
## 2016-01-28 NOTE — Progress Notes (Signed)
* * *        [name]**    --- ---    55 Y old Female, DOB: 08/09/1960, External MRN: [id number]    Account Number: [id number]    [address] [name], [id number]    Home: [phone]    Guarantor: [name] Insurance: NHP [place]    PCP: [name], MD Referring: Fortunato Curling, MD    [facility]        * * *    01/28/2016   **Appointment Provider:** Alfonse Ras **CHN#:** [id number]    --- ---      **Supervising Provider:** Yetta Numbers, MD    ---        Reason for Appointment    ---      1\. FU Left Shoulder MRI RESULTS    ---      History of Present Illness    ---     _GENERAL_ :    [name] is a 55 year old RHD female who presents for follow up of her MRI  results for her left shoulder evaluation. She continues to have limited range  of motion and increased pain since her visit 3 days ago. Denies any numbness  or tingling of the upper extremity and no motor deficits distal to the left  shoulder.      Current Medications    ---    Taking     * Baclofen 10 MG Tablet 1/2 tab Orally prn    ---    * Enbrel SureClick 50 MG/ML Solution Auto-injector inject 50mg  (1 pen) sc Subcutaneous every week    ---    * GLIPIZIDE 5MG  TABLETS 1 tablet daily    ---    * Hydroxychloroquine Sulfate 200 mg Tablet 1 tablet with food or milk Orally Once a day    ---    * Levothyroxine Sodium 50 MCG Tablet 1 tablet Orally Once a day    ---    * METFORMIN TAB 500MG  4 tablets daily    ---    * Omeprazole 40 mg Capsule Delayed Release 1 capsule Orally Once a day    ---    * PredniSONE 5 mg Tablet 2 tablet Orally Once a day    ---    * Tramadol HCl 50 MG Tablet 1 tablet Orally every 6 hrs    ---    * Voltaren 1 % Gel 2 grams Transdermal twice daily, Notes: PRN    ---    * Medication List reviewed and reconciled with the patient    ---      Past Medical History    ---       Scleroderma - CREST dx 2007.        ---    IgG4 deficiency s/p IVIG 2010 ----single infusion given preventively  after  week of bilateral knee replacements at [facility] 2010.        ---    Fx left wrist in 1994.        ---    Blood clot at LUE in 2006 on a short course of Coumadin.        ---    Bilateral carpal tunnel syndrome s/p Lt carpal tunnel release and steroids  injection right--.        ---    Bone spur left foot.        ---    Scoliosis.        ---  Spinal stenosis s/p steroids injection.        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 Katrinka Blazing 2010--- never infected, but packed with antibiotics with  surgery;.        ---    Right rotator cuff repairs x 4, complicated by repeat tears, infection,  placement of anchor material.        ---    Elevated liver function tests.        ---      Surgical History    ---      Right rotator cuff repair, with infected hardware that had to be removed  2006    ---    Repeat right shoulder surgery, also which became infected. 2007    ---    Left rotator cuff repair 2008    ---    bilateral knee replacements 2009    ---    Left arthroscopic carpal tunnel release 01/2011    ---    ORIF Left 4th metatarsal bone    ---    Left Ulna shortening 1994    ---      Family History    ---      Mother: deceased 55 yrs, lung cancer, hyperthyroidism, diagnosed with Cancer    ---    Father: deceased 58s yrs, heart attack, diagnosed with Heart Disease    ---    3 brother(s) .    ---    FH of arthritis and scleroderma    Father deceased from MI    Mother deceased age 55 with lung cancer    Brother with scleroderma / copd.    ---      Social History    ---    Tobacco history: Never smoked.    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    Alcohol    _Former daily EtOH use in 20s_   Nonsmoker.    Lives with longstanding boyfriend.    ---      Allergies    ---      N.K.D.A.    ---      Hospitalization/Major Diagnostic Procedure    ---      as above    ---      Review of Systems    ---     _ORT_ :    Eyes No. Ear, Nose Throat No. Digestion, Stomach,  Bowel No. Bladder Problems  No. Bleeding Problems No. Numbness/Tingling Yes. Anxiety/Depression No.  Fever/Chills/Fatigue No. Chest Pain/Tightness/Palpitations No. Skin Rash No.  Dental Problems No. Joint/Muscle Pain/Cramps Yes. Blackout/Fainting No. Other  No.    Positive for musculoskeletal as reported in HPI.    Negative for fever, chills, fatigue, night sweats, chest pain, chest  tightness, difficulty breathing, heart palpitations, skin rash, tooth/mouth  pain, blackout, fainting, blurry vision, double vision, stomach pain,  heartburn, constipation, diarrhea, dysuria, hematuria, bleeding problems,  anxiety.      Vital Signs    ---    Pain scale 10, Ht-in 62, Ht-cm 157.48.      Physical Examination    ---    General - Sitting comfortably in NAD    Observation - Lidocaine patch on lateral aspect of arm.    LUE- Some mild swelling of the shoulder. No bony deformity. She has focal  tenderness of the AC joint and periscapular area. Active forward flexion to 70  degrees, passively improves to 160 but uncomfortable. Active abduction to 60  degrees and passively able to abduct fully with mild discomfort. Active ER  equal to contralateral shoulder. Active IR to the lumbar spine, equal  bilaterally. Pain with crossover testing. Sensation intact to light touch in  median, ulnar and radial nerve distributions. Hand is warm and well perfused.    IMAGING: MRI of the left shoulder reviewed by US shows acute on chonic rotator  cuff tear, retraction of the rotator cuff tendon and fatty atrophy of the  supraspinatus and infraspinatus. Humeral head is displaced superiorly towards  the acromion.      Assessments    ---    1\. Complete tear of left rotator cuff - M75.122 (Primary)    ---      Treatment    ---       **1\. Complete tear of left rotator cuff**    Notes: Notes: Patient was seen today and MRI results were discussed with Dr.  [name]. It was explained to the patient that her injury was likely an acute  on chronic tear of  her rotator cuff, however due to the chronic atrophy of the  supraspinatus and infraspinatus the patient is not a candidate for a rotator  cuff repair. Her options include injection and physical therapy or a reverse  total shoulder replacement, both were discussed with the patient. Risks of  surgery, including but not limited to infection, nerve damage, stiffness,  impkant looseing or dislocation, and incomplete symptom relief were explained.  She has decided to think about it, discuss with her job and will follow up  with Erasmo Score to schedule.    ---      Follow Up    ---    prn    **Appointment Provider:** Alfonse Ras    Electronically signed by Yetta Numbers , MD on 01/28/2016 at 09:44 AM EDT    Sign off status: Completed        * * *        [facility]    [address]    [place], [place] [address]    Tel: [phone]    Fax: [phone]              * * *          Patient: [name], [name] DOB: 1961-03-16 Progress Note: Alfonse Ras  01/28/2016    ---    Note generated by eClinicalWorks EMR/PM Software ([email or web address])

## 2016-02-03 LAB — HX IMMUNOLOGY
HX QUANTIFERON MITOGEN-NIL: 0.83 [IU]/mL
HX QUANTIFERON TB AG-NIL: 0.07 [IU]/mL
HX QUANTIFERON-NIL: 0.1 [IU]/mL
HX QUANTIFERON: NEGATIVE

## 2016-02-04 ENCOUNTER — Ambulatory Visit: Admitting: Rheumatology

## 2016-02-04 NOTE — Progress Notes (Signed)
* * *        **  [name]**    --- ---    43 Y old Female, DOB: Aug 28, 1960    [address] [name], [place] [address]    Home: [phone]    Provider: [name]        * * *    Telephone Encounter    ---    Answered by   Griffin Basil  Date: 02/04/2016         Time: 09:36 AM    Caller   Patient    --- ---            Reason   Call back            Message                      Patient says above pharmacy would need a call back from MD before they can refill her Prednisone and Hydroxchloroquine. Patient would also like for MD to give her a contact back best ph: [phone].                 Action Taken   [facility] 02/04/2016 9:37:08 AM > Sateriale,Rachel  02/04/2016 9:42:59 AM > I spoke with the pharmacy and all they needed was a new  script for the two medications which i faxed over. I called the patient and  let her know that you are away until next week.            Refills  Refill Hydroxychloroquine Sulfate Tablet, 200 mg, Orally, 30 Tablet,  1 tablet with food or milk, Once a day, 30 days, Refills=5    --- ---      Refill PredniSONE Tablet, 5 mg, Orally, 60 Tablet, 2 tablet, Once a day, 30  days, Refills=3          * * *                ---          * * *          PatientSARYA, [name] DOB: Aug 31, 1960 Provider: [name] 02/04/2016    ---    Note generated by eClinicalWorks EMR/PM Software ([email or web address])

## 2016-02-07 ENCOUNTER — Ambulatory Visit: Admitting: Hand Surgery

## 2016-02-07 NOTE — Progress Notes (Signed)
* * *        **  [name]**    --- ---    36 Y old Female, DOB: 1961/01/16    [address], [place] [address]    Home: [phone]    Provider: Yetta Numbers        * * *    Telephone Encounter    ---    Answered by   Harley Alto  Date: 02/07/2016         Time: 12:03 PM    Caller   [name]    --- ---            Reason   call back            Message                      Good afternoon,             The patient is calling to schedule surgery, please call back to do so at [phone].                 Action Taken   Viaud,Berdine 02/07/2016 12:05:44 PM > Persico,Claudio 02/08/2016  8:33:41 AM > Called back. Scheduled for 03/07/16                * * *                ---          * * *          Patient: [name], [name] DOB: 1960/04/22 Provider: Yetta Numbers 02/07/2016    ---    Note generated by eClinicalWorks EMR/PM Software ([email or web address])

## 2016-02-15 ENCOUNTER — Ambulatory Visit

## 2016-02-15 NOTE — Telephone Encounter (Signed)
PHONE NOTE     CALL INFORMATION:   Reason for call: Refill  Person Calling: Caelen  Relationship to pt: Pt  PCP: [facility]  Day Phone #: [phone]      CALL DETAILS:   Rx refill:  LIDODERM 5 % EXTERNAL PATCH (LIDOCAINE) apply one patch daily to affected area      TRAMADOL HCL 50 MG ORAL TABLET (TRAMADOL HCL) take one tablet daily as needed for pain      [facility]*  [address]  [place], [place]  [address]     Phone: [phone]    Fax: [phone]    Call Taken by: .....................................Marland KitchenMarland KitchenSunday Shams  February 15, 2016 11:54 AM        Prescriptions:  TRAMADOL HCL 50 MG ORAL TABLET (TRAMADOL HCL) take one tablet daily as needed for pain  #30 x 0   Entered by: Clayborne Artist, RN   Authorized by: Neale Burly, MD   Signed by: Clayborne Artist, RN on 02/15/2016   Method used: Printed then faxed to ...     [facility]* (retail)     [address]     [place], [place]  [id number]     Ph: [phone]     Fax: [phone]   RxID: [id number]  LIDODERM 5 % EXTERNAL PATCH (LIDOCAINE) apply one patch daily to affected area  #30[Unspecified] x 5   Entered by: Clayborne Artist, RN   Authorized by: [name], MD   Signed by: Clayborne Artist, RN on 02/15/2016   Method used: Electronically to      [facility] [address]* (retail)     [address]     [place], [place]  [id number]     Ph: [phone]     Fax: [phone]   RxID: [id number]      RESPONSE/ORDERS:                 ORDERS/PROBS/MEDS/ALL     Problems:   ANNUAL EXAM (ICD-V72.31) (ICD10-Z00.00)  MAMMOGRAPHIC SCREENING FOR BREAST CANCER (ICD-V76.12) (ICD10-Z12.31)  DIABETES MELLITUS, TYPE II (ICD-250.00) (ICD10-E11.9)  LONG-TERM (CURRENT) USE OF STEROIDS (ICD-V58.65) (ICD10-Z79.51)  OSTEOPOROSIS SCREENING (ICD-V82.81) (ICD10-Z13.820)  TRANSAMINASES, SERUM, ELEVATED (ICD-790.4) (ICD10-R74.0)  OBESITY (ICD-278.00) (ICD10-E66.9)      DIABETES MELLITUS, TYPE II, UNCONTROLLED (ICD-250.02) (ICD10-E11.65)      FATTY LIVER DISEASE  (ICD-571.8) (ICD10-K76.0)  ? 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)  CARPAL TUNNEL (ICD-354.0) (ICD10-G56.00)  OSTEOARTHRITIS (ICD-715.09) (ICD10-M15.9)  S/P ROTATOR CUFF SURGERY 12/2004 - RT SHOULDER; 2008 LT (ICD-V45.89)  Family Hx of MELANOMA, FAMILY HX (ICD-V16.8) (ICD10-Z80.8)  FRACTURE OF OTHER SPEC SITE,  PATHOLOGIC - MULTIPLE (ICD-733.19)  CHOLECYSTECTOMY AND HERNIA REPAIR (ICD-V45.89)  VITAMIN D DEFICIENCY (ICD-268.9) (ICD10-E55.9)  VACCINATION WITH TDAP (ICD-V06.8) (JIR67-E93)  COLONOSCOPY (ICD-V76.51) (ICD10-Z01.89)    Meds (prior to this call):   PREDNISONE 10 MG ORAL TABLET (PREDNISONE) TK 1 T PO  ONCE Daily  LEVOTHYROXINE SODIUM 25 MCG ORAL TABLET (LEVOTHYROXINE SODIUM) Take one tablet by mouth once daily  OMEPRAZOLE 20 MG ORAL CAPSULE DELAYED RELEASE (OMEPRAZOLE) Take one capsule by mouth twice a day  IBUPROFEN 600 MG ORAL TABLET (IBUPROFEN) 1 tab by mouth TID as  needed for pain  BACLOFEN 10 MG ORAL TABLET (BACLOFEN) Please take half to one tablet as needed for back spasms  DICLOFENAC SODIUM 1 % TRANSDERMAL GEL (DICLOFENAC SODIUM) APP 2 GRAMS EXT AA BID  PROAIR HFA 108 (90 Base) MCG/ACT INHALATION AEROSOL SOLUTION (ALBUTEROL SULFATE) Inhale 2 puffs 4 times a day as needed for wheezing  LOTRISONE 1-0.05 % EXTERNAL CREAM (CLOTRIMAZOLE-BETAMETHASONE) apply to affected area on ankle twice a day      FREESTYLE FREEDOM LITE w/Device KIT (BLOOD GLUCOSE MONITORING SUPPL) use as directed (ICD 10 E11.65)      FREESTYLE LITE TEST IN VITRO STRIP (GLUCOSE BLOOD) check fingerstick  three times a day (ICD 10 E11.65)      FREESTYLE LANCETS (LANCETS) check fingerstick three times a day (ICD 10 E11.65)  GLUCOPHAGE XR 500 MG ORAL TABLET EXTENDED RELEASE 24 HOUR (METFORMIN HCL) take four tablets by mouth daily every morning  ROSUVASTATIN CALCIUM 10 MG ORAL TABLET (ROSUVASTATIN CALCIUM) take one tablet by mouth daily  GLIPIZIDE ER 2.5 MG ORAL TABLET EXTENDED RELEASE 24 HOUR (GLIPIZIDE) take one tablet daily  TRAMADOL HCL 50 MG ORAL TABLET (TRAMADOL HCL) take one tablet daily as needed for pain  OXYCODONE HCL 5 MG ORAL TABLET (OXYCODONE HCL) Partial fill upon patient request.  Take one tablet daily as needed for pain >8  CALCIUM CARBONATE-VITAMIN D 600-200 MG-UNIT ORAL TABLET (CALCIUM CARBONATE-VITAMIN D) take one tablet twice a day  LIDODERM 5 % EXTERNAL PATCH (LIDOCAINE) apply one patch daily to affected area    Changes to Meds (this update):   Rx of LIDODERM 5 % EXTERNAL PATCH (LIDOCAINE) apply one patch daily to affected area;  #30[Unspecified] x 5;  Signed;  Entered by: Clayborne Artist, RN;  Authorized by: [name], MD;  Method used: Electronically to [facility] [address]*, [address], [place], [place]  [id number], Ph: [phone], Fax: [phone]  Rx of TRAMADOL HCL 50 MG ORAL TABLET (TRAMADOL HCL) take one tablet daily as needed for pain;  #30 x 0;  Signed;  Entered by: Clayborne Artist, RN;  Authorized by: Neale Burly, MD;  Method used: Printed then faxed to [facility]*, [address] [place], [place]  [id number], Ph: [phone], Fax: [phone]        Created By Sunday Shams on 02/15/2016 at 11:54 AM    Electronically Signed By Clayborne Artist RN on 02/15/2016 at 01:31 PM

## 2016-02-16 ENCOUNTER — Ambulatory Visit: Admitting: Hand Surgery

## 2016-02-16 NOTE — Progress Notes (Signed)
* * *        **  [name]**    --- ---    75 Y old Female, DOB: April 21, 1960    [address] [place], [place] [address]    Home: [phone]    Provider: Yetta Numbers        * * *    Telephone Encounter    ---    Answered by   Harley Alto  Date: 02/16/2016         Time: 04:40 PM    Caller   [name]    --- ---            Reason   Call Back            Message                      Hello,             The patient wants to know when her recovery period is to request time off work, please call back at [phone]. Thank you :)                Action Taken   Viaud,Berdine 02/16/2016 4:43:39 PM > Persico,Claudio  02/17/2016 12:26:32 PM > , Action - pt telephoned. Left message. Her preop is  scheduled for 03/02/16 at 9:45am. Needs to go directly to [facility] 5th floor.                * * *                ---          * * *          Patient: [name], [name] DOB: [birth year] Provider: Yetta Numbers 02/16/2016    ---    Note generated by eClinicalWorks EMR/PM Software ([email or web address])

## 2016-02-17 ENCOUNTER — Ambulatory Visit: Admitting: Surgery

## 2016-02-17 NOTE — Progress Notes (Signed)
.  Progress Notes  .  Patient: [name], [name]  Provider: Biagio Borg  DOB: [birth year] Age: 55 Y Sex: Female  .  PCP: [name]  MD  Date: 02/17/2016  .  --------------------------------------------------------------------------------  .  REASON FOR APPOINTMENT  .  1. Presents today for diabetic foot and nail care. relates  rupturing her left achilles last year and not getting surgery.  now she just limps. hx of scleroderma, treated by rheumatology.  plate in left foot due to break in 2006  .  CURRENT MEDICATIONS  .  Taking Baclofen 10 MG Tablet 1/2 tab Orally prn  Taking Enbrel SureClick 50 MG/ML Solution Auto-injector inject  50mg  (1 pen) sc Subcutaneous every week, Notes: not yet, still  pending  Taking GLIPIZIDE 5MG  TABLETS 1 tablet daily  Taking Hydroxychloroquine Sulfate 200 mg Tablet 1 tablet with  food or milk Orally Once a day  Taking Levothyroxine Sodium 50 MCG Tablet 1 tablet Orally Once a  day  Taking METFORMIN TAB 500MG  4 tablets daily  Taking Omeprazole 40 mg Capsule Delayed Release 1 capsule Orally  Once a day  Taking PredniSONE 5 mg Tablet 2 tablet Orally Once a day  Taking Tramadol HCl 50 MG Tablet 1 tablet Orally every 6 hrs  Taking Voltaren 1 % Gel 2 grams Transdermal twice daily, Notes:  PRN  .  PAST MEDICAL HISTORY  .  Scleroderma - CREST dx 2007  IgG4 deficiency s/p IVIG 2010 ----single infusion given  preventively after week of bilateral knee replacements at [facility]  2010  Fx left wrist in 1994  Blood clot at LUE in 2006 on a short course of Coumadin  Bilateral carpal tunnel syndrome s/p Lt carpal tunnel release and  steroids injection right--  Bone spur left foot  Scoliosis  Spinal stenosis s/p steroids injection  s/p bilateral knee replacements for valgus /arthritic  complications, performed by Dr. Katrinka Blazing 2010--- never infected, but  packed with antibiotics with surgery;  Right rotator cuff repairs x 4, complicated by repeat tears,  infection, placement of anchor material  Elevated  liver function tests  Diabetes  .  ALLERGIES  .  N.K.D.A.  .  SURGICAL HISTORY  .  Right rotator cuff repair, with infected hardware that had to be  removed 2006  Repeat right shoulder surgery, also which became infected. 2007  Left rotator cuff repair 2008  bilateral knee replacements 2009  Left arthroscopic carpal tunnel release 01/2011  ORIF Left 4th metatarsal bone  Left Ulna shortening 1994  Knee replacement 2010  Hand surgery 2013, 2015  remove gallbladder/hernia 1990  Plate left foot 3rd metatarsal 2008  .  FAMILY HISTORY  .  Mother: deceased 78 yrs, lung cancer, hyperthyroidism, diagnosed  with Cancer  Father: deceased 26s yrs, heart attack, diagnosed with Heart  Disease  3 brother(s) .  FH of arthritis and sclerodermaFather deceased from MIMother  deceased age 38 with lung cancer Brother with scleroderma / copd.  .  SOCIAL HISTORY  .  .  Tobacco  history: Never smoked.  .  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  .  Alcohol Former daily EtOH use in 20s.  .  Nonsmoker.Lives with longstanding boyfriend.  [name]  HOSPITALIZATION/MAJOR DIAGNOSTIC PROCEDURE  .  as above  .  REVIEW OF SYSTEMS  .  ORT:  .  Eyes    No . Ear, Nose Throat    No . Digestion, Stomach, Bowel     Yes . Bladder Problems  No . Bleeding Problems    No .  Numbness/Tingling    Yes . Anxiety/Depression    No .  Fever/Chills/Fatigue    No . Chest Pain/Tightness/Palpitations     No . Skin Rash    No . Dental Problems    No . Joint/Muscle  Pain/Cramps    Yes . Blackout/Fainting    No . Other    No .  .  VITAL SIGNS  .  Pain scale 4, Ht-in 62, Ht-cm 157.48.  [name]  EXAMINATION  .  GENERAL: 1+/4 PT and DP pulses with neurological  sensation is intact, negative monofilament. skin is intact with  no fissures, abscesses or ulcerations. there is hammering of  lesser toes, left worse than right. nails are onychauxic.  .  ASSESSMENTS  .  Type 2 diabetes mellitus without complication, unspecified long  term insulin use status - E11.9  (Primary)  .  Hammertoe of left foot - M20.42  .  Hammertoe of right foot - M20.41  .  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  Pes cavus of left foot - Q66.7  .  TREATMENT  .  Type 2 diabetes mellitus without complication,  unspecified long term insulin use status  Notes: discussed with patient proper diabetic foot and nail care.  instructed patient to continue to cut her nails straight accross.  discussed with patient oral medication to treat fungal nails,  patient is unable to take because of elevated LFTs. discussed  with patient that a cusotm support would help her achilles.  provided patient with a presctiption for custom orthotics.  .  FOLLOW UP  .  1 Year  .  Electronically signed by Victorio Palm , DPM on  02/22/2016 at 07:20 AM EST  .  Document electronically signed by Biagio Borg

## 2016-02-17 NOTE — Progress Notes (Signed)
* * *        [name], [name]**    --- ---    55 Y old Female, DOB: September 12, 1960, 55 External MRN: [id number]    Account Number: [id number]    [address], [place], [id number]    Home: [phone]    Guarantor: [name] Insurance: [place] [place] Payer ID: PAPER    PCP: [name], MD Referring: Fortunato Curling, MD External Visit ID:  [id number]    [facility]oint[facility] Facility[facility]        * * *    02/17/2016  Progress Notes: Victorio Palm, DPM **CHN#:** [id number]    --- ---    ---        Current Medications    ---    Taking     * Baclofen 10 MG Tablet 1/2 tab Orally prn    ---    * Enbrel SureClick 50 MG/ML Solution Auto-injector inject 50mg  (1 pen) sc Subcutaneous every week, Notes: not yet, still pending    ---    * GLIPIZIDE 5MG  TABLETS 1 tablet daily    ---    * Hydroxychloroquine Sulfate 200 mg Tablet 1 tablet with food or milk Orally Once a day    ---    * Levothyroxine Sodium 50 MCG Tablet 1 tablet Orally Once a day    ---    * METFORMIN TAB 500MG  4 tablets daily    ---    * Omeprazole 40 mg Capsule Delayed Release 1 capsule Orally Once a day    ---    * PredniSONE 5 mg Tablet 2 tablet Orally Once a day    ---    * Tramadol HCl 50 MG Tablet 1 tablet Orally every 6 hrs    ---    * Voltaren 1 % Gel 2 grams Transdermal twice daily, Notes: PRN    ---      Past Medical History    ---       Scleroderma - CREST dx 2007.        ---    IgG4 deficiency s/p IVIG 2010 ----single infusion given preventively after  week of bilateral knee replacements at [facility] 2010.        ---    Fx left wrist in 1994.        ---    Blood clot at LUE in 2006 on a short course of Coumadin.        ---    Bilateral carpal tunnel syndrome s/p Lt carpal tunnel release and steroids  injection right--.        ---    Bone spur left foot.        ---    Scoliosis.        ---    Spinal stenosis s/p steroids injection.        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 Katrinka Blazing 2010--- never infected, but  packed with antibiotics with  surgery;.        ---    Right rotator cuff repairs x 4, complicated by repeat tears, infection,  placement of anchor material.        ---    Elevated liver function tests.        ---    Diabetes.        ---      Surgical History    ---      Right rotator cuff repair,  with infected hardware that had to be removed  2006    ---    Repeat right shoulder surgery, also which became infected. 2007    ---    Left rotator cuff repair 2008    ---    bilateral knee replacements 2009    ---    Left arthroscopic carpal tunnel release 01/2011    ---    ORIF Left 4th metatarsal bone    ---    Left Ulna shortening 1994    ---    Knee replacement 2010    ---    Hand surgery 2013, 2015    ---    remove gallbladder/hernia 1990    ---    Plate left foot 3rd metatarsal 2008    ---      Family History    ---      Mother: deceased 7 yrs, lung cancer, hyperthyroidism, diagnosed with Cancer    ---    Father: deceased 53s yrs, heart attack, diagnosed with Heart Disease    ---    3 brother(s) .    ---    FH of arthritis and scleroderma    Father deceased from MI    Mother deceased age 53 with lung cancer    Brother with scleroderma / copd.    ---      Social History    ---    Tobacco history: Never smoked.    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    Alcohol  Former daily EtOH use in 20s.   Nonsmoker.    Lives with longstanding boyfriend.    ---      Allergies    ---      N.K.D.A.    ---    [name]]      Hospitalization/Major Diagnostic Procedure    ---      as above    ---      Review of Systems    ---     _ORT_ :    Eyes No. Ear, Nose Throat No. Digestion, Stomach, Bowel Yes. Bladder Problems  No. Bleeding Problems No. Numbness/Tingling Yes. Anxiety/Depression No.  Fever/Chills/Fatigue No. Chest Pain/Tightness/Palpitations No. Skin Rash No.  Dental Problems No. Joint/Muscle Pain/Cramps Yes. Blackout/Fainting No. Other  No.            Reason for Appointment    ---      1\. Presents today  for diabetic foot and nail care. relates rupturing her  left achilles last year and not getting surgery. now she just limps. hx of  scleroderma, treated by rheumatology. plate in left foot due to break in 2006    ---      Vital Signs    ---    Pain scale 4, Ht-in 62, Ht-cm 157.48.      Examination    ---     _GENERAL_ :    1+/4 PT and DP pulses with neurological sensation is intact, negative  monofilament. skin is intact with no fissures, abscesses or ulcerations. there  is hammering of lesser toes, left worse than right. nails are onychauxic.          Assessments    ---    1\. Type 2 diabetes mellitus without complication, unspecified long term  insulin use status - E11.9 (Primary)    ---    2\. Hammertoe of left foot - M20.42    ---    3\. Hammertoe of right foot - M20.41    ---  4\. 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 Type 2 diabetes mellitus without complication, unspecified long term  insulin use status**    Notes: discussed with patient proper diabetic foot and nail care. instructed  patient to continue to cut her nails straight accross. discussed with patient  oral medication to treat fungal nails, patient is unable to take because of  elevated LFTs. discussed with patient that a cusotm support would help her  achilles. provided patient with a presctiption for custom orthotics.    ---      Follow Up    ---    1 Year    Electronically signed by Victorio Palm , DPM on 02/22/2016 at 07:20 AM EST    Sign off status: Completed        * * [facility]    [address]    [place], [place] [address]    Tel: [phone]    Fax: [phone]              * * *          Patient: [name], [name] DOB: 1960/09/29 Progress Note: Victorio Palm, DPM  02/17/2016    ---    Note generated by eClinicalWorks EMR/PM Software ([email or web address])

## 2016-03-02 ENCOUNTER — Ambulatory Visit

## 2016-03-02 LAB — HX CHEM-PANELS
HX ANION GAP: 12 (ref 5–18)
HX BLOOD UREA NITROGEN: 17 mg/dL (ref 6–24)
HX CHLORIDE (CL): 103 meq/L (ref 98–110)
HX CO2: 23 meq/L (ref 20–30)
HX CREATININE (CR): 0.89 mg/dL (ref 0.57–1.30)
HX GFR, AFRICAN AMERICAN: 86 mL/min/{1.73_m2} — ABNORMAL LOW
HX GFR, NON-AFRICAN AMERICAN: 74 mL/min/{1.73_m2} — ABNORMAL LOW
HX GLUCOSE: 284 mg/dL — ABNORMAL HIGH (ref 70–139)
HX POTASSIUM (K): 4.5 meq/L (ref 3.6–5.1)
HX SODIUM (NA): 138 meq/L (ref 135–145)

## 2016-03-02 LAB — HX TRANSFUSION
HX ABO-RH INTERPRETATION (GEL): O POS
HX ANTIBODY SCREEN (GEL): NEGATIVE

## 2016-03-02 LAB — HX HEM-ROUTINE
HX HCT: 41.7 % (ref 32.0–45.0)
HX HGB: 13.9 g/dL (ref 11.0–15.0)
HX PLT: 237 10*3/uL (ref 150–400)

## 2016-03-02 LAB — HX CHEM-METABOLIC: HX HEMOGLOBIN A1C: 8.7 % — ABNORMAL HIGH (ref 4.3–5.8)

## 2016-03-02 LAB — HX DIABETES
HX GLUCOSE: 284 mg/dL — ABNORMAL HIGH (ref 70–139)
HX HEMOGLOBIN A1C: 8.7 % — ABNORMAL HIGH (ref 4.3–5.8)

## 2016-03-02 NOTE — Progress Notes (Signed)
****    ---    **  Patient:** [name], [name]     **Account Number:** [id number] **External MRN:** [id number]  **Provider:**  Appointment Resource     **DOB:** 1960-07-29 **Age:** 69 Y **Sex:** Female  **Date:** 03/02/2016     **Phone:** [phone]     **Address:** [address] [place], [id number]     **Pcp:** [name], MD        * * *        **Subjective:**        ---      **Chief Complaints:**    ------      1\. Q46.962 DOS 03/07/16. 2. Please see Clinic Notes in [facility].. 3.  Please see Clinic Notes in [facility].[name]    ------     **Medical History:** Scleroderma - CREST dx 2007, IgG4 deficiency s/p IVIG  2010 ----single infusion given preventively after week of bilateral knee  replacements at [facility] 2010, Fx left wrist in 1994, Blood clot at LUE in 2006 on  a short course of Coumadin, Bilateral carpal tunnel syndrome s/p Lt carpal  tunnel release and steroids injection right--, Bone spur left foot, Scoliosis,  Spinal stenosis s/p steroids injection, s/p bilateral knee replacements for  valgus /arthritic complications, performed by Dr. Katrinka Blazing 2010--- never  infected, but packed with antibiotics with surgery;, Right rotator cuff  repairs x 4, complicated by repeat tears, infection, placement of anchor  material, Elevated liver function tests, Diabetes.        ------        **Objective:**        ---         **Assessment:**        ---         **Plan:**        ---        ------    ---    ---          **Provider:** Appointment Resource    ---     **Patient:** [name], [name] **DOB:** 1960/10/12 **Date:** 03/02/2016    ---    Electronically signed by Bettey Mare on 03/02/2016 at 10:20 AM EST    Sign off status: Completed

## 2016-03-07 ENCOUNTER — Ambulatory Visit

## 2016-03-07 ENCOUNTER — Inpatient Hospital Stay
Admit: 2016-03-07 | Disposition: A | Discharge status: Home Health | Source: Ambulatory Visit | Attending: Hand Surgery | Admitting: Hand Surgery

## 2016-03-07 ENCOUNTER — Inpatient Hospital Stay
Admission: RE | Admit: 2016-03-07 | Discharge: 2016-03-08 | Disposition: A | Discharge status: Home Health | Payer: No Typology Code available for payment source

## 2016-03-07 HISTORY — PX: REVERSE TOTAL SHOULDER ARTHROPLASTY: SHX2344

## 2016-03-07 LAB — HX POINT OF CARE
HX GLUCOSE-POCT: 212 mg/dL — ABNORMAL HIGH (ref 70–139)
HX GLUCOSE-POCT: 295 mg/dL — ABNORMAL HIGH (ref 70–139)
HX GLUCOSE-POCT: 230 mg/dL — ABNORMAL HIGH (ref 70–139)

## 2016-03-07 NOTE — Op Note (Signed)
Patient    Joel, Mericle              Med Rec #:  [id number]  Name:  Operation  03/07/2016                Pt.  Dt:                                  Location:  .  [name]                               OPERATIVE REPORT  .  [name]  PREOPERATIVE DIAGNOSIS:  Left shoulder rotator cuff arthropathy.  [name]  POSTOPERATIVE DIAGNOSIS:  Left shoulder rotator cuff arthropathy.  [name]  PROCEDURE:  Left reverse total shoulder arthroplasty.  .  SURGEON:  Yetta Numbers, MD  .  ASSISTANTS:  1.  Crist Infante, MD  2.  Rocky Crafts, MD  .  ANESTHESIA:  General.  .  COMPLICATIONS:  None.  .  ESTIMATED BLOOD LOSS:  200 mL.  .  IMPLANT:  Tornier Aequalis 25 mm x 30 mm threaded base plate, 2 mm inferior  36 mm glenosphere, size 3 humeral stem, 6 mm insert.  .  INDICATIONS:  She has inflammatory arthritis and underwent a left rotator  cuff repair in the past.  She had an injury several months ago and has lost  shoulder function.  MRI demonstrates rotator cuff arthropathy.  Surgical  options including attempted rotator cuff repair, superior capsular  reconstruction, reverse total shoulder arthroplasty were explained and  relative risks and benefits of each were reviewed.  She wishes to proceed  with a reverse total shoulder arthroplasty.  Risks of surgery including,  but not limited to infection, nerve damage, stiffness, implant loosening or  instability, and incomplete pain relief were explained preoperatively.  .  DESCRIPTION OF THE PROCEDURE:  Following adequate anesthesia, the patient  was placed in the modified beach chair position on the operating table.  A  bump was placed beneath the left scapula.  Left shoulder and upper  extremity were prepped and draped in sterile fashion.  A deltopectoral  incision was made.  The cephalic vein was identified and retracted  laterally.  The deltopectoral interval was developed.  The biceps tendon  was noted to be chronically ruptured.  The clavipectoral fascia was incised  along the lateral border of the  conjoined tendon.  The subscapularis was  also noted to be chronically ruptured.  Some residual tendon fibers were  released.  The capsule was released around the inferior articular edge and  the shoulder was dislocated anteriorly.  The humeral head was resected in  20 degrees of retroversion using the alignment guide.  The first retained  bone anchor was in the resected specimen.  The second bone anchor was  visible at the resection level and was removed.  The humerus was then  retracted posteriorly.  The glenoid labrum was resected.  The residual  articular cartilage was removed using a curette.  The guide was used to  place a threaded guide pin into the glenoid vault.  This was then measured  and overreamed and then overdrilled.  The guidewire was removed and this  opening was tapped and a 25x30 mm threaded baseplate was inserted with  excellent purchase.  This was further secured using 3 supplemental locking  screws.  A  36 mm glenosphere with a 2-mm inferior tilt was then impacted  and the screw was tightened to confirm security.  Attention was then  directed to the humerus.  The canal was opened using the sounds and then  incrementally broached.  The size #3 stem was most appropriate.  A trial  reduction was performed and the 6 mm was most appropriate.  The trial  instruments were removed.  The high offset tray was impacted onto the stem  between a 6 and 7 o'clock positions and then the stem was impacted into the  humerus.  A 6 mm insert was then impacted onto the tray and the joint was  reduced.  The joint was stable throughout the full arc of motion.  One  drill hole had been placed in the neck of the humerus and #2 FiberWire  suture had been passed through this.  This was used to be reapproximate the  remaining subscapularis fibers with the shoulder in 30 degrees of external  rotation.  The wound was irrigated copiously.  Hemostasis was achieved with  electrocautery.  The deltopectoral interval was  reapproximated using 0  Vicryl running sutures.  Subcutaneous tissue was approximated with 3-0  Vicryl interrupted suture and the skin with 4-0 Monocryl running  subcuticular suture.  Steri-Strips, sterile dressing, and Aquacel dressing  were applied followed by a sling.  The patient tolerated procedure well and  was discharged to the recovery room in good condition.  .  .  .  Electronically Signed  Yetta Numbers, MD 03/12/2016 09:02 P  .  .  [name]  Dictated byYetta Numbers, MD  .  D:    03/12/2016  T:    03/12/2016 12:17 P  Dictation ID:  [id number]/Doc#  [id number]  .  cc:  .  [name]      Document is preliminary until electronically or manually signed by                             attending physician.

## 2016-03-08 ENCOUNTER — Ambulatory Visit

## 2016-03-08 LAB — HX HEM-ROUTINE
HX HCT: 38.9 % (ref 32.0–45.0)
HX HGB: 13 g/dL (ref 11.0–15.0)
HX MCH: 30.4 pg (ref 26.0–34.0)
HX MCHC: 33.4 g/dL (ref 32.0–36.0)
HX MCV: 91.1 fL (ref 80.0–98.0)
HX MPV: 10.1 fL (ref 9.1–11.7)
HX NRBC #: 0 10*3/uL
HX NUCLEATED RBC: 0 %
HX PLT: 279 10*3/uL (ref 150–400)
HX RBC BLOOD COUNT: 4.27 M/uL (ref 3.70–5.00)
HX RDW: 13 % (ref 11.5–14.5)
HX WBC: 17.9 10*3/uL — ABNORMAL HIGH (ref 4.0–11.0)

## 2016-03-08 LAB — HX POINT OF CARE
HX GLUCOSE-POCT: 169 mg/dL — ABNORMAL HIGH (ref 70–139)
HX GLUCOSE-POCT: 172 mg/dL — ABNORMAL HIGH (ref 70–139)
HX GLUCOSE-POCT: 233 mg/dL — ABNORMAL HIGH (ref 70–139)

## 2016-03-08 NOTE — Discharge Summary (Signed)
Patient Name: [name]            MRN: [id number]  DOB: 22-Mar-1961  Admit Date: 03/07/2016  10:03:00AM  Atn Physician: Yetta Numbers MD  Age: 27Y  Discharge Medication List:  Allergies: No Known Drug Allergies, No Known Food Allergies  UserDefinedString2: [facility]  NEWMEDLIST  This is your current medication list; please discard all old medication lists.  Carry this with you at all times including emergency situations and take this to all doctor's appointments.  Please review this list carefully for changes in drugs, doses or frequency with which they are taken and remember to update this list when medications are changed.                                                                                                                                                                                         Take ONLY the medications listed on this form                                                                                                                                                                                                                    acetaminophen   325 mg Tablet, 1-2 tablets oral every four hours as needed for pain or temperature  Additional Instructions:highest daily dose is 4000mg , do not take highest daily dose for more then 7 days continually  Entered AV:WUJWJX  Weber                                                                                                                                                          ---------------------------------------------------------------------------------------------------------------------------------------------------------------------------------------  baclofen   10 mg Tablet, 1 tablet oral three times a day as needed for muscle spasm  Additional Instructions:  Entered UY:QIHKVQ  Weber                                                                                                                                                           ---------------------------------------------------------------------------------------------------------------------------------------------------------------------------------------                                                                                                                                                          cyclobenzaprine   10 mg Tablet, 1 tablet oral every twelve hours as needed for muscle spasm  Additional Instructions:  Entered QV:ZDGLOV  Weber                                                                                                                                                          ---------------------------------------------------------------------------------------------------------------------------------------------------------------------------------------  docusate sodium (Colace) 100 mg Capsule, 1 capsule oral twice a day as needed for constipation  Additional Instructions:  Entered BJ:YNWGNF  Weber                                                                                                                                                          ---------------------------------------------------------------------------------------------------------------------------------------------------------------------------------------                                                                                                                                                          etanercept (Enbrel SureClick) 50 mg/mL (0.98 mL) Pen Injector, 1 mL subcutaneous weekly    Additional Instructions:  Entered AO:ZHYQMV  Weber                                                                                                                                                           ---------------------------------------------------------------------------------------------------------------------------------------------------------------------------------------  glipiZIDE   5 mg tablet extended release 24hr, 1 tablet oral daily    Additional Instructions:  Entered ZO:XWRUEA  Weber                                                                                                                                                          ---------------------------------------------------------------------------------------------------------------------------------------------------------------------------------------                                                                                                                                                          hydroxychloroquine   200 mg Tablet, 1 tablet oral daily    Additional Instructions:  Entered VW:UJWJXB  Weber                                                                                                                                                          ---------------------------------------------------------------------------------------------------------------------------------------------------------------------------------------  levothyroxine   25 mcg Tablet, 1 tablet oral daily    Additional Instructions:  Entered ZO:XWRUEA  Weber                                                                                                                                                           ---------------------------------------------------------------------------------------------------------------------------------------------------------------------------------------                                                                                                                                                          lidocaine   5 % (700 mg/patch) adhesive patch,medicated, 1 patch transdermal daily as needed for pain  Additional Instructions:  Entered VW:UJWJXB  Weber                                                                                                                                                          ---------------------------------------------------------------------------------------------------------------------------------------------------------------------------------------  metFORMIN   500 mg Tablet, 1 tablet oral twice a day    Additional Instructions:  Entered ZO:XWRUEA  Weber                                                                                                                                                          ---------------------------------------------------------------------------------------------------------------------------------------------------------------------------------------                                                                                                                                                          mupirocin   2 % Ointment, 1 application topical daily as needed for itching  Additional Instructions:  Entered VW:UJWJXB  Weber                                                                                                                                                           ---------------------------------------------------------------------------------------------------------------------------------------------------------------------------------------  omeprazole   40 mg capsule,delayed release(DR/EC), 1 capsule oral daily    Additional Instructions:  Entered ZO:XWRUEA  Weber                                                                                                                                                          ---------------------------------------------------------------------------------------------------------------------------------------------------------------------------------------                                                                                                                                                          oxyCODONE   5 mg Tablet, 1-3 tablets oral every 3 hours as needed for pain  Additional Instructions:  Entered VW:UJWJXB  Weber                                                                                                                                                          ---------------------------------------------------------------------------------------------------------------------------------------------------------------------------------------  peg-electrolyte soln   420 gram Recon Soln, 5 mL oral daily    Additional Instructions:  Entered ZO:XWRUEA  Weber                                                                                                                                                           ---------------------------------------------------------------------------------------------------------------------------------------------------------------------------------------                                                                                                                                                          polyethylene glycol 3350 (Miralax) 17 gram/dose Powder, 1 each oral daily as needed for constipation  Additional Instructions:  Entered VW:UJWJXB  Weber                                                                                                                                                          ---------------------------------------------------------------------------------------------------------------------------------------------------------------------------------------  predniSONE   5 mg Tablet, 2 tablet oral daily    Additional Instructions:  Entered ZO:XWRUEA  Weber                                                                                                                                                          ---------------------------------------------------------------------------------------------------------------------------------------------------------------------------------------                                                                                                                                                          traMADol   50 mg Tablet, 1 tablet oral daily as needed for pain  Additional Instructions:  Entered VW:UJWJXB  Weber                                                                                                                                                           ---------------------------------------------------------------------------------------------------------------------------------------------------------------------------------------  Documents reviewed with Family   /   Healthcare Proxy - Agent   /   Guardian       (circle role)  Discharge Nurse: _______________________________  Name of Unit: PG7____________________ Phone:  [phone]- ______________  Date: ______________________________ Time:________________________  Electronically signed by:              Pt Name: HAANI BAKULA  MRN: [id number]    Created By  [employer] on 03/08/2016 at 02:22 PM    Electronically Signed By [name], MD on 03/08/2016 at 02:56 PM

## 2016-03-09 ENCOUNTER — Ambulatory Visit

## 2016-03-09 NOTE — Telephone Encounter (Signed)
PHONE NOTE     CALL INFORMATION:     Person Calling: [name]   Relationship to pt: [facility]   Day Phone #: [phone]      CALL DETAILS:   FYI pt will be followed by [facility]   Call Taken by: ......................................Marland KitchenNancy Nordmann  March 09, 2016 3:30 PM         RESPONSE/ORDERS:  pt had L shoulder rotator cuff arthropathy.. .......................................Marland KitchenClayborne Artist, RN  March 09, 2016 3:51 PM                 ORDERS/PROBS/MEDS/ALL     Problems:   ANNUAL EXAM (ICD-V72.31) (ICD10-Z00.00)  MAMMOGRAPHIC SCREENING FOR BREAST CANCER (ICD-V76.12) (ICD10-Z12.31)  DIABETES MELLITUS, TYPE II (ICD-250.00) (ICD10-E11.9)  LONG-TERM (CURRENT) USE OF STEROIDS (ICD-V58.65) (ICD10-Z79.51)  OSTEOPOROSIS SCREENING (ICD-V82.81) (ICD10-Z13.820)  TRANSAMINASES, SERUM, ELEVATED (ICD-790.4) (ICD10-R74.0)  OBESITY (ICD-278.00) (ICD10-E66.9)      DIABETES MELLITUS, TYPE II, UNCONTROLLED (ICD-250.02) (ICD10-E11.65)      FATTY LIVER DISEASE (ICD-571.8) (ICD10-K76.0)  ? 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)  CARPAL TUNNEL (ICD-354.0) (ICD10-G56.00)  OSTEOARTHRITIS (ICD-715.09) (ICD10-M15.9)  S/P ROTATOR CUFF SURGERY 12/2004 - RT SHOULDER; 2008 LT (ICD-V45.89)  Family Hx of MELANOMA, FAMILY HX (ICD-V16.8) (ICD10-Z80.8)  FRACTURE OF OTHER SPEC SITE,  PATHOLOGIC - MULTIPLE (ICD-733.19)  CHOLECYSTECTOMY AND HERNIA REPAIR (ICD-V45.89)  VITAMIN D DEFICIENCY (ICD-268.9) (ICD10-E55.9)  VACCINATION WITH TDAP (ICD-V06.8) (BMW41-L24)  COLONOSCOPY (ICD-V76.51)  (ICD10-Z01.89)    Meds (prior to this call):   PREDNISONE 10 MG ORAL TABLET (PREDNISONE) TK 1 T PO  ONCE Daily  LEVOTHYROXINE SODIUM 25 MCG ORAL TABLET (LEVOTHYROXINE SODIUM) Take one tablet by mouth once daily  OMEPRAZOLE 20 MG ORAL CAPSULE DELAYED RELEASE (OMEPRAZOLE) Take one capsule by mouth twice a day  IBUPROFEN 600 MG ORAL TABLET (IBUPROFEN) 1 tab by mouth TID as needed for pain  BACLOFEN 10 MG ORAL TABLET (BACLOFEN) Please take half to one tablet as needed for back spasms  DICLOFENAC SODIUM 1 % TRANSDERMAL GEL (DICLOFENAC SODIUM) APP 2 GRAMS EXT AA BID  PROAIR HFA 108 (90 Base) MCG/ACT INHALATION AEROSOL SOLUTION (ALBUTEROL SULFATE) Inhale 2 puffs 4 times a day as needed for wheezing  LOTRISONE 1-0.05 % EXTERNAL CREAM (CLOTRIMAZOLE-BETAMETHASONE) apply to affected area on ankle twice a day      FREESTYLE FREEDOM LITE w/Device KIT (BLOOD GLUCOSE MONITORING SUPPL) use as directed (ICD 10 E11.65)      FREESTYLE LITE TEST IN VITRO STRIP (GLUCOSE BLOOD) check fingerstick three times a day (ICD 10 E11.65)      FREESTYLE LANCETS (LANCETS) check fingerstick three times a day (ICD 10 E11.65)  GLUCOPHAGE XR 500 MG ORAL TABLET EXTENDED RELEASE 24 HOUR (METFORMIN HCL) take four tablets by mouth daily every morning  ROSUVASTATIN CALCIUM 10 MG ORAL TABLET (ROSUVASTATIN CALCIUM) take one tablet by mouth daily  GLIPIZIDE ER 2.5 MG ORAL TABLET EXTENDED RELEASE 24 HOUR (GLIPIZIDE) take one tablet daily  TRAMADOL HCL 50 MG ORAL TABLET (TRAMADOL HCL) take one tablet daily as needed for pain  OXYCODONE HCL 5 MG ORAL TABLET (OXYCODONE HCL) Partial fill upon patient request.  Take one tablet daily as needed for pain >8  CALCIUM CARBONATE-VITAMIN D 600-200 MG-UNIT ORAL TABLET (CALCIUM CARBONATE-VITAMIN D) take one tablet twice a day  LIDODERM 5 % EXTERNAL PATCH (LIDOCAINE) apply one patch daily to affected area          Created By [name] Nordmann on 03/09/2016 at 03:29 PM    Electronically Signed By Clayborne Artist RN on  03/09/2016 at 03:53 PM

## 2016-03-13 ENCOUNTER — Ambulatory Visit: Admitting: Hand Surgery

## 2016-03-13 ENCOUNTER — Ambulatory Visit

## 2016-03-13 ENCOUNTER — Ambulatory Visit: Admit: 2016-03-13 | Payer: No Typology Code available for payment source

## 2016-03-13 NOTE — Progress Notes (Signed)
* * *        [name]**    --- ---    55 Y old Female, DOB: September 08, 1960, External MRN: [id number]    Account Number: [id number]    [address] [name], [id number]    Home: [phone]    Guarantor: [name] Insurance: [place]P [place]    PCP: [name], MD Referring: [name]    Appointment Facility[facility]        * * *    03/13/2016  Progress Notes: Yetta Numbers, MD **CHN#:** [id number]    --- ---    ---        Reason for Appointment    ---      1\. POST OP FU LT RTSA    ---      History of Present Illness    ---     _GENERAL_ :    f/u. pain improving.      Current Medications    ---    Unknown     * Baclofen 10 MG Tablet 1/2 tab Orally prn    ---    * Enbrel SureClick 50 MG/ML Solution Auto-injector inject 50mg  (1 pen) sc Subcutaneous every week, Notes: not yet, still pending    ---    * GLIPIZIDE 5MG  TABLETS 1 tablet daily    ---    * Hydroxychloroquine Sulfate 200 mg Tablet 1 tablet with food or milk Orally Once a day    ---    * Levothyroxine Sodium 50 MCG Tablet 1 tablet Orally Once a day    ---    * METFORMIN TAB 500MG  4 tablets daily    ---    * Omeprazole 40 mg Capsule Delayed Release 1 capsule Orally Once a day    ---    * PredniSONE 5 mg Tablet 2 tablet Orally Once a day    ---    * Tramadol HCl 50 MG Tablet 1 tablet Orally every 6 hrs    ---    * Voltaren 1 % Gel 2 grams Transdermal twice daily, Notes: PRN    ---    * Medication List reviewed and reconciled with the patient    ---      Past Medical History    ---       Scleroderma - CREST dx 2007.        ---    IgG4 deficiency s/p IVIG 2010 ----single infusion given preventively after  week of bilateral knee replacements at [facility] 2010.        ---    Fx left wrist in 1994.        ---    Blood clot at LUE in 2006 on a short course of Coumadin.        ---    Bilateral carpal tunnel syndrome s/p Lt carpal tunnel release and steroids  injection right--.        ---    Bone spur left foot.        ---    Scoliosis.         ---    Spinal stenosis s/p steroids injection.        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 Katrinka Blazing 2010--- never infected, but packed with antibiotics with  surgery;.        ---    Right rotator cuff repairs x 4, complicated by repeat  tears, infection,  placement of anchor material.        ---    Elevated liver function tests.        ---    Diabetes.        ---      Surgical History    ---      Right rotator cuff repair, with infected hardware that had to be removed  2006    ---    Repeat right shoulder surgery, also which became infected. 2007    ---    Left rotator cuff repair 2008    ---    bilateral knee replacements 2009    ---    Left arthroscopic carpal tunnel release 01/2011    ---    ORIF Left 4th metatarsal bone    ---    Left Ulna shortening 1994    ---    Knee replacement 2010    ---    Hand surgery 2013, 2015    ---    remove gallbladder/hernia 1990    ---    Plate left foot 3rd metatarsal 2008    ---      Family History    ---      Mother: deceased 59 yrs, lung cancer, hyperthyroidism, diagnosed with Cancer    ---    Father: deceased 32s yrs, heart attack, diagnosed with Heart Disease    ---    3 brother(s) .    ---    FH of arthritis and scleroderma    Father deceased from MI    Mother deceased age 63 with lung cancer    Brother with scleroderma / copd.    ---      Social History    ---    Tobacco history: Never smoked.    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    Alcohol  Former daily EtOH use in 20s.   Nonsmoker.    Lives with longstanding boyfriend.    ---      Allergies    ---      N.K.D.A.    ---      Hospitalization/Major Diagnostic Procedure    ---      as above    ---      Review of Systems    ---     _ORT_ :    General Negative for fevers, chills, or night sweats. Cardiac and Respiratory  Negative for shortness of breath or chest pain. Genitourinary Negative for  dysuria or frequency. Skin Negative for rashes or ulcers.  Neurologic Negative  for sudden loss of vision, hearing, or speech, No numbness or tingling, No  weakness. Other The remainder of review of systems was discussed with patient  and is negative. Eyes No. Ear, Nose Throat No. Digestion, Stomach, Bowel No.  Bladder Problems No. Bleeding Problems No. Numbness/Tingling No.  Anxiety/Depression No. Fever/Chills/Fatigue No. Chest  Pain/Tightness/Palpitations No. Skin Rash No. Dental Problems No. Joint/Muscle  Pain/Cramps No. Blackout/Fainting No. lower back pain negative. left shoulder  pain negative. bilateral shoulder pain negative. right knee pain negative.  left knee pain negative. right hip pain negative. left hip pain negative.  bilateral hip pain negative. right ankle pain negative. left ankle pain  negative. bilateral ankle pain negative. Limb/Joint Pain No. Numbness/Tingling  No.          Vital Signs    ---    Pain scale 5.      Physical Examination    ---  appears healthy. Aquacel removed, skin intact, incision healing without signs  of infection, sensation intac tto light touch. fingersp pink and warm AROM 90  degrees forward elevation.      Assessments    ---    1\. 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 Left rotator cuff tear arthropathy**    Notes: new sutures removed, steristrips applied. Given script for PT. no  weightbearing on left. avoid ER >30.    ---      Follow Up    ---    6 wks with Xrays left shoulder    Electronically signed by Yetta Numbers , MD on 03/17/2016 at 01:10 PM EST    Sign off status: Completed        * * *        [facility]    [address]    [place], [place] [address]    Tel: [phone]    Fax: [phone]              * * *          Patient: [name] DOB: 11/04/1960 Progress Note: Yetta Numbers, MD  03/13/2016    ---    Note generated by eClinicalWorks EMR/PM Software ([email or web address])

## 2016-03-13 NOTE — Progress Notes (Signed)
.  Progress Notes  .  Patient: [name], [name]  Provider: Yetta Numbers    .  DOB: 19-Mar-1961 Age: 55 Y Sex: Female  .  PCP: [name]  MD  Date: 03/13/2016  .  --------------------------------------------------------------------------------  .  REASON FOR APPOINTMENT  .  1. POST OP FU LT RTSA  .  HISTORY OF PRESENT ILLNESS  .  GENERAL:   f/u. pain improving.  .  CURRENT MEDICATIONS  .  Unknown Baclofen 10 MG Tablet 1/2 tab Orally prn  Unknown Enbrel SureClick 50 MG/ML Solution Auto-injector inject  50mg  (1 pen) sc Subcutaneous every week, Notes: not yet, still  pending  Unknown GLIPIZIDE 5MG  TABLETS 1 tablet daily  Unknown Hydroxychloroquine Sulfate 200 mg Tablet 1 tablet with  food or milk Orally Once a day  Unknown Levothyroxine Sodium 50 MCG Tablet 1 tablet Orally Once a  day  Unknown METFORMIN TAB 500MG  4 tablets daily  Unknown Omeprazole 40 mg Capsule Delayed Release 1 capsule Orally  Once a day  Unknown PredniSONE 5 mg Tablet 2 tablet Orally Once a day  Unknown Tramadol HCl 50 MG Tablet 1 tablet Orally every 6 hrs  Unknown Voltaren 1 % Gel 2 grams Transdermal twice daily, Notes:  PRN  Medication List reviewed and reconciled with the patient  .  PAST MEDICAL HISTORY  .  Scleroderma - CREST dx 2007  IgG4 deficiency s/p IVIG 2010 ----single infusion given  preventively after week of bilateral knee replacements at [facility]  2010  Fx left wrist in 1994  Blood clot at LUE in 2006 on a short course of Coumadin  Bilateral carpal tunnel syndrome s/p Lt carpal tunnel release and  steroids injection right--  Bone spur left foot  Scoliosis  Spinal stenosis s/p steroids injection  s/p bilateral knee replacements for valgus /arthritic  complications, performed by Dr. Katrinka Blazing 2010--- never infected, but  packed with antibiotics with surgery;  Right rotator cuff repairs x 4, complicated by repeat tears,  infection, placement of anchor material  Elevated liver function tests  Diabetes  .  ALLERGIES  .  N.K.D.A.  .  SURGICAL  HISTORY  .  Right rotator cuff repair, with infected hardware that had to be  removed 2006  Repeat right shoulder surgery, also which became infected. 2007  Left rotator cuff repair 2008  bilateral knee replacements 2009  Left arthroscopic carpal tunnel release 01/2011  ORIF Left 4th metatarsal bone  Left Ulna shortening 1994  Knee replacement 2010  Hand surgery 2013, 2015  remove gallbladder/hernia 1990  Plate left foot 3rd metatarsal 2008  .  FAMILY HISTORY  .  Mother: deceased 34 yrs, lung cancer, hyperthyroidism, diagnosed  with Cancer  Father: deceased 11s yrs, heart attack, diagnosed with Heart  Disease  3 brother(s) .  FH of arthritis and sclerodermaFather deceased from MIMother  deceased age 76 with lung cancer Brother with scleroderma / copd.  .  SOCIAL HISTORY  .  .  Tobacco  history: Never smoked.  .  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  .  Alcohol Former daily EtOH use in 20s.  .  Nonsmoker.Lives with longstanding boyfriend.  [name]  HOSPITALIZATION/MAJOR DIAGNOSTIC PROCEDURE  .  as above  .  REVIEW OF SYSTEMS  .  ORT:  .  General    Negative for fevers, chills, or night sweats . Cardiac  and Respiratory    Negative for shortness of breath or chest pain  . Genitourinary    Negative for dysuria or  frequency . Skin     Negative for rashes or ulcers . Neurologic    Negative for sudden  loss of vision, hearing, or speech, No numbness or tingling, No  weakness . Other    The remainder of review of systems was  discussed with patient and is negative . Eyes    No . Ear, Nose  Throat    No . Digestion, Stomach, Bowel    No . Bladder Problems     No . Bleeding Problems    No . Numbness/Tingling    No .  Anxiety/Depression    No . Fever/Chills/Fatigue    No . Chest  Pain/Tightness/Palpitations    No . Skin Rash    No . Dental  Problems    No . Joint/Muscle Pain/Cramps    No .  Blackout/Fainting    No . lower back pain    negative . left  shoulder pain    negative . bilateral shoulder pain    negative  .  right knee pain    negative . left knee pain    negative . right  hip pain    negative . left hip pain    negative . bilateral hip  pain    negative . right ankle pain    negative . left ankle pain     negative . bilateral ankle pain    negative . Limb/Joint Pain     No . Numbness/Tingling    No .  .  VITAL SIGNS  .  Pain scale 5.  .  PHYSICAL EXAMINATION  .  appears healthy. Aquacel removed, skin intact, incision healing  without signs of infection, sensation intac tto light touch.  fingersp pink and warm AROM 90 degrees forward elevation.  .  ASSESSMENTS  .  Left rotator cuff tear arthropathy - M12.812 (Primary)  .  TREATMENT  .  Left rotator cuff tear arthropathy  Notes: new sutures removed, steristrips applied. Given script for  PT. no weightbearing on left. avoid ER >30.  [name]  FOLLOW UP  .  6 wks with Xrays left shoulder  .  Electronically signed by Yetta Numbers , MD on  03/17/2016 at 01:10 PM EST  .  Document electronically signed by Yetta Numbers    .

## 2016-03-15 ENCOUNTER — Ambulatory Visit

## 2016-03-15 ENCOUNTER — Ambulatory Visit: Admitting: "Endocrinology

## 2016-03-15 ENCOUNTER — Ambulatory Visit: Admitting: Adult Health

## 2016-03-15 ENCOUNTER — Ambulatory Visit: Admit: 2016-03-15 | Payer: No Typology Code available for payment source

## 2016-03-15 NOTE — Progress Notes (Signed)
* * *        [name], [name]**    --- ---    55 Y old Female, DOB: [birth year], External MRN: [id number]    Account Number: [id number]    [address] [name], [id number]    Home: [phone]    Guarantor: [name] Insurance: [place] [place] Payer ID: PAPER    PCP: [name] Forts, MD Referring: [name] External Visit ID:  [id number]    Appointment Facility: Endocrinology        * * *    03/15/2016   **Appointment Provider:** [name], NP **CHN#:** [id number]    --- ---      **Supervising Provider:** Paulla Dolly, MD    ---        Current Medications    ---    Taking     * Baclofen 10 MG Tablet 1/2 tab Orally prn    ---    * Calcium Carbonate-Vitamin D 600-200 MG-UNIT Capsule 1 capsule with a meal Orally Twice a day    ---    * Enbrel SureClick 50 MG/ML Solution Auto-injector inject 50mg  (1 pen) sc Subcutaneous every week, Notes: not yet, still pending    ---    * GlipiZIDE XL 5 MG Tablet Extended Release 24 Hour 1 tablet Orally Once a day    ---    * Glucophage XR 500 mg Tablet Extended Release 24 Hour 4 tablets Orally Once a day    ---    * Hydroxychloroquine Sulfate 200 mg Tablet 1 tablet with food or milk Orally Once a day    ---    * Levothyroxine Sodium 25 MCG Tablet 1 tablet Orally Once a day    ---    * Omeprazole 40 mg Capsule Delayed Release 1 capsule Orally Once a day    ---    * PredniSONE 5 mg Tablet 2 tablet Orally Once a day    ---    * Rosuvastatin Calcium 10 MG Tablet 1 tablet Orally Once a day    ---    * Tramadol HCl 50 MG Tablet 1 tablet Orally every 6 hrs    ---    * Voltaren 1 % Gel 2 grams Transdermal twice daily, Notes: PRN    ---    Discontinued    * GLIPIZIDE 5MG  TABLETS 1 tablet daily    ---    * METFORMIN TAB 500MG  4 tablets daily    ---    * Medication List reviewed and reconciled with the patient    ---      Past Medical History    ---       Scleroderma - CREST dx 2007.        ---    IgG4 deficiency s/p IVIG 2010 ----single infusion given preventively after  week of bilateral  knee replacements at [facility] 2010.        ---    Fx left wrist in 1994.        ---    Blood clot at LUE in 2006 on a short course of Coumadin.        ---    Bilateral carpal tunnel syndrome s/p Lt carpal tunnel release and steroids  injection right--.        ---    Bone spur left foot.        ---    Scoliosis.        ---    Spinal stenosis s/p steroids  injection.        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 Katrinka Blazing 2010--- never infected, but packed with antibiotics with  surgery;.        ---    Right rotator cuff repairs x 4, complicated by repeat tears, infection,  placement of anchor material.        ---    Elevated liver function tests.        ---    Diabetes.        ---      Surgical History    ---      Right rotator cuff repair, with infected hardware that had to be removed  2006    ---    Repeat right shoulder surgery, also which became infected. 2007    ---    Left rotator cuff repair 2008    ---    bilateral knee replacements 2009    ---    Left arthroscopic carpal tunnel release 01/2011    ---    ORIF Left 4th metatarsal bone    ---    Left Ulna shortening 1994    ---    Knee replacement 2010    ---    Hand surgery 2013, 2015    ---    remove gallbladder/hernia 1990    ---    Plate left foot 3rd metatarsal 2008    ---      Social History    ---    Tobacco history: Never smoked.    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    Alcohol    _Former daily EtOH use in 20s_   Nonsmoker.    Lives with longstanding boyfriend.    ---      Allergies    ---      N.K.D.A.    ---    [name]]      Hospitalization/Major Diagnostic Procedure    ---      as above    ---      Review of Systems    ---     _Diabetes_ :    General: no unintended weight change, no fevers, malaise, weakness. Eyes: no  vision loss. Cardiovascular: no CP, no orthopnea, no palpitations.  Respiratory: no wheezing, no dyspnea. Gastrointestinal: no n/v, no abdominal  pain.  Musculoskeletal: no edema. Skin: no rash. Neurological: no numbness.            Reason for Appointment    ---      1\. IN-PT FU/DIABETES    ---      History of Present Illness    ---     _General_ :    [name] is a 55 y/o female with scleroderma (on prednisone currently), IgG-4  deficiency, NASH and uncontrolled T2DM. Pt is referred today for management of  ongoing hyperglycemia. She reports diabetes diagnosis >6 months ago, though  has had elevated hemoglobin a1c at 7% or greater since 12/2013. In May 2017 her  blood glucose significantly worsened with an a1c level of 11%. Currently she  is taking glipizide and metformin. Pt is checking her blood sugars  consistently, twice daily. She denies recent hypoglycemic symptoms although  had been symptomatically hypoglycemic to the 40-60's range on a higher dose of  glipizide-10mg  over the summer.    Diabetes Meds:    glipizide 5mg     metformin 2000mg  daily    no previous medications or insulin injections.  A1c Trend:    8.7%-----03/02/16    11%-----08/05/15    7.1%-----11/04/14    7%-----12/31/13    6.6%-----05/06/13.    Recent Blood Glucoses:    no meter or bg logs today    reports checking 2x daily    fasting bg range: 180-200    1 hr post meal: 230-280's.    Diet:    appetite is good    3 meals per dayno formal nutrition evaluation.    Exercise:    walks 1-2 mi daily with dog.    Microvascular Complications Screening:    eyes: no acute vision changes, no hx of retinopathy, reports annual exam with  ophthalmologist is scheduled    kidneys: creat:0.89, egfr:74, urine albumin:<0.5    feet: denies numbess or parethesias in feet, seen by Dr. Vassie Loll- hammering of  the lesser toes, given rx for custom orthotics.    Macrovascular Complications Screening:    no hx of MI, CVA or PVD    (+) hld    Lipids: TC:286, TG:179, HDL:65, LDL:185 (08/05/15)--started on 10mg  rosuvastatin  after this. Due for repeat.      Vital Signs    ---    Pain scale 5, Ht-in 62, Wt-lbs 212.6, BMI  38.88, BP 140/75, HR 90, BSA 2.05,  Ht-cm 157.48, Wt-kg 96.43, Wt Change -9.4 lb.      Physical Examination    ---     _Diabetes_ :    Gen: alert and oriented x3, no distress.    Eyes: nonicteric, PERRL.    Neck: supple, no lad.    Cardio: heart rate and rhythm regular, S1S2.    Resp: unlabored and even, clear to auscultation bilaterally.    GI: obese, nontender.    Musl: no peripheral edema, ambulates with antalgic gait.    Neuro: sensation intact to light touch, no skin breakdown.    >50% of this 40 minute visit spent counseling the patient.      Assessments    ---    1\. Other hyperlipidemia - E78.4 (Primary)    ---    2\. Type 2 diabetes mellitus with hyperglycemia, without long-term current use  of insulin - E11.65    ---    3\. Obesity (BMI 30-39.9) - E66.9    ---      Ms Stoecker is a 55 y/o female with uncontrolled T2DM. Today she was seen for  an initial visit in management of her ongoing hyperglycemia. Debbie's most  recent hemoglobin a1c was 8.7% (03/02/16). By her report her fasting and  postprandial bg readings sound high though no meter or bg logs for review. She  has not had formal nutrition counseling and would benefit from this education  going forward. Pt will set up an initial visit with nutrition to discuss the  diet and it's impact on blood glucose readings. She will also consistently  record both fasting and 2 hr post dinner bg readings for [place] to review next  time. I will not change the treatment until further bg data is reviewed. She  experienced hypoglycemia on higher doses of glipizide and is currently on the  maximum dose of metformin. Depending on the blood glucose patterns could  consider add on oral therapy or changing the glipizide to once daily basal  lantus injections. Will need to be cautious about add on oral therapy given  the hx of NASH with transaminitis.    Prior LDL was elevated at 185 (08/2015) although pt has started on high potency  statin-crestor since that time. She is  tolerating the current dose without  reported side effects.    ---      Treatment    ---       **1\. Other hyperlipidemia**    Continue Rosuvastatin Calcium Tablet, 10 MG, 1 tablet, Orally, Once a day    ---         **2\. Type 2 diabetes mellitus with hyperglycemia, without long-term current  use of insulin**    Continue GlipiZIDE XL Tablet Extended Release 24 Hour, 5 MG, 1 tablet, Orally,  Once a day    Continue Glucophage XR Tablet Extended Release 24 Hour, 500 mg, 4 tablets,  Orally, Once a day         **3\. Others**    Notes: I have reviewed the findings, assessment and treatment plan with [name], NP and have edited Progress Note as required. Will work on glucose  monitoring to look at patterns. Can then consider change of therapy to bring  down A1C without lows.      Follow Up    ---    4-6 weeks f/u, nutrition referral    **Appointment Provider:** [name], NP    Electronically signed by Paulla Dolly , MD on 03/17/2016 at 08:59 AM EST    Sign off status: Completed        * * *        Endocrinology    [address]    [place], [place] [address]    Tel: [phone]    Fax: [phone]              * * *          Patient: [name], [name] DOB: 14-Aug-1960 Progress Note: [name], NP  03/15/2016    ---    Note generated by eClinicalWorks EMR/PM Software ([email or web address])

## 2016-03-15 NOTE — Progress Notes (Signed)
.  Progress Notes  .  Patient: [name]  Provider: [name]  NP  .  DOB: 1960-10-14 Age: 55 Y Sex: Female  Supervising Provider:: Paulla Dolly, MD  Date: 03/15/2016  .  PCP: [name]  MD  Date: 03/15/2016  .  --------------------------------------------------------------------------------  .  REASON FOR APPOINTMENT  .  1. IN-PT FU/DIABETES  .  HISTORY OF PRESENT ILLNESS  .  General:  [name] is a 55 y/o female with  scleroderma (on prednisone currently), IgG-4 deficiency, NASH and  uncontrolled T2DM. Pt is referred today for management of ongoing  hyperglycemia. She reports diabetes diagnosis >6 months ago,  though has had elevated hemoglobin a1c at 7% or greater since  12/2013. In May 2017 her blood glucose significantly worsened with  an a1c level of 11%. Currently she is taking glipizide and  metformin. Pt is checking her blood sugars consistently, twice  daily. She denies recent hypoglycemic symptoms although had been  symptomatically hypoglycemic to the 40-60's range on a higher  dose of glipizide-10mg  over the summer.  Diabetes Meds:  .  glipizide 5mg   metformin 2000mg  daily  no previous medications or insulin injections.  .  A1c Trend:  .  8.7%-----03/02/16  11%-----08/05/15  7.1%-----11/04/14  7%-----12/31/13  6.6%-----05/06/13.  [name]  Recent Blood Glucoses:  .  no meter or bg logs today  reports checking 2x daily  fasting bg range: 180-200  1 hr post meal: 230-280's.  .  Diet:  .  appetite is good  3 meals per dayno formal nutrition evaluation.  .  Exercise:  .  walks 1-2 mi daily with dog.  .  Microvascular Complications Screening:  .  eyes: no acute vision changes, no hx of retinopathy, reports  annual exam with ophthalmologist is scheduled  kidneys: creat:0.89, egfr:74, urine albumin:<0.5  feet: denies numbess or parethesias in feet, seen by Dr. Vassie Loll-  hammering of the lesser toes, given rx for custom orthotics.  .  Macrovascular Complications Screening:  .  no hx of MI, CVA or PVD  (+)  hld  Lipids: TC:286, TG:179, HDL:65, LDL:185 (08/05/15)--started on 10mg   rosuvastatin after this. Due for repeat.  .  CURRENT MEDICATIONS  .  Taking Baclofen 10 MG Tablet 1/2 tab Orally prn  Taking Calcium Carbonate-Vitamin D 600-200 MG-UNIT Capsule 1  capsule with a meal Orally Twice a day  Taking Enbrel SureClick 50 MG/ML Solution Auto-injector inject  50mg  (1 pen) sc Subcutaneous every week, Notes: not yet, still  pending  Taking GlipiZIDE XL 5 MG Tablet Extended Release 24 Hour 1 tablet  Orally Once a day  Taking Glucophage XR 500 mg Tablet Extended Release 24 Hour 4  tablets Orally Once a day  Taking Hydroxychloroquine Sulfate 200 mg Tablet 1 tablet with  food or milk Orally Once a day  Taking Levothyroxine Sodium 25 MCG Tablet 1 tablet Orally Once a  day  Taking Omeprazole 40 mg Capsule Delayed Release 1 capsule Orally  Once a day  Taking PredniSONE 5 mg Tablet 2 tablet Orally Once a day  Taking Rosuvastatin Calcium 10 MG Tablet 1 tablet Orally Once a  day  Taking Tramadol HCl 50 MG Tablet 1 tablet Orally every 6 hrs  Taking Voltaren 1 % Gel 2 grams Transdermal twice daily, Notes:  PRN  Discontinued GLIPIZIDE 5MG  TABLETS 1 tablet daily  Discontinued METFORMIN TAB 500MG  4 tablets daily  Medication List reviewed and reconciled with the patient  .  PAST MEDICAL HISTORY  .  Scleroderma - CREST dx 2007  IgG4 deficiency s/p IVIG 2010 ----single infusion given  preventively after week of bilateral knee replacements at [facility]  2010  Fx left wrist in 1994  Blood clot at LUE in 2006 on a short course of Coumadin  Bilateral carpal tunnel syndrome s/p Lt carpal tunnel release and  steroids injection right--  Bone spur left foot  Scoliosis  Spinal stenosis s/p steroids injection  s/p bilateral knee replacements for valgus /arthritic  complications, performed by Dr. Katrinka Blazing 2010--- never infected, but  packed with antibiotics with surgery;  Right rotator cuff repairs x 4, complicated by repeat tears,  infection, placement of  anchor material  Elevated liver function tests  Diabetes  .  ALLERGIES  .  N.K.D.A.  .  SURGICAL HISTORY  .  Right rotator cuff repair, with infected hardware that had to be  removed 2006  Repeat right shoulder surgery, also which became infected. 2007  Left rotator cuff repair 2008  bilateral knee replacements 2009  Left arthroscopic carpal tunnel release 01/2011  ORIF Left 4th metatarsal bone  Left Ulna shortening 1994  Knee replacement 2010  Hand surgery 2013, 2015  remove gallbladder/hernia 1990  Plate left foot 3rd metatarsal 2008  .  SOCIAL HISTORY  .  .  Tobacco  history: Never smoked.  [name]  Work/Occupation: Administrator at fitness center.  .  .  Alcohol  Former daily EtOH use in 20s  .  Nonsmoker.Lives with longstanding boyfriend.  [name]  HOSPITALIZATION/MAJOR DIAGNOSTIC PROCEDURE  .  as above  .  REVIEW OF SYSTEMS  .  Diabetes:  .  General:    no unintended weight change, no fevers, malaise,  weakness . Eyes:    no vision loss . Cardiovascular:    no CP, no  orthopnea, no palpitations . Respiratory:    no wheezing, no  dyspnea . Gastrointestinal:    no n/v, no abdominal pain .  Musculoskeletal:    no edema . Skin:    no rash . Neurological:     no numbness .  [name]  VITAL SIGNS  .  Pain scale 5, Ht-in 62, Wt-lbs 212.6, BMI 38.88, BP 140/75, HR  90, BSA 2.05, Ht-cm 157.48, Wt-kg 96.43, Wt Change -9.4 lb.  .  PHYSICAL EXAMINATION  .  Diabetes:  Gen:  alert and oriented x3, no distress.  Eyes:  nonicteric, PERRL.  Neck:  supple, no lad.  Cardio:  heart rate and rhythm regular, S1S2.  Resp:  unlabored and even, clear to auscultation bilaterally.  GI:  obese, nontender.  Musl:  no peripheral edema, ambulates with antalgic gait.  Neuro:  sensation intact to light touch, no skin breakdown.  >50% of this 40 minute visit spent counseling the patient.  .  ASSESSMENTS  .  Other hyperlipidemia - E78.4 (Primary)  .  Type 2 diabetes mellitus with hyperglycemia, without long-term  current use of insulin - E11.65  .  Obesity  (BMI 30-39.9) - E66.9  .  [name] is a 55 y/o female with uncontrolled T2DM. Today she was  seen for an initial visit in management of her ongoing  hyperglycemia. [name]'s most recent hemoglobin a1c was 8.7%  (03/02/16). By her report her fasting and postprandial bg  readings sound high though no meter or bg logs for review. She  has not had formal nutrition counseling and would benefit from  this education going forward. Pt will set up an initial visit  with nutrition to discuss  the diet and it's impact on blood  glucose readings. She will also consistently record both fasting  and 2 hr post dinner bg readings for [place] to review next time. I  will not change the treatment until further bg data is reviewed.  She experienced hypoglycemia on higher doses of glipizide and is  currently on the maximum dose of metformin. Depending on the  blood glucose patterns could consider add on oral therapy or  changing the glipizide to once daily basal lantus injections.  Will need to be cautious about add on oral therapy given the hx  of NASH with transaminitis. Prior LDL was elevated at 185  (04/6107) although pt has started on high potency statin-crestor  since that time. She is tolerating the current dose without  reported side effects.  .  TREATMENT  .  Other hyperlipidemia  Continue Rosuvastatin Calcium Tablet, 10 MG, 1 tablet, Orally,  Once a day  .  [name]  Type 2 diabetes mellitus with hyperglycemia,  without long-term current use of insulin  Continue GlipiZIDE XL Tablet Extended Release 24 Hour, 5 MG, 1  tablet, Orally, Once a day  Continue Glucophage XR Tablet Extended Release 24 Hour, 500 mg, 4  tablets, Orally, Once a day  .  [name]  Others  Notes: I have reviewed the findings, assessment and treatment  plan with [name], NP and have edited Progress Note as  required. Will work on glucose monitoring to look at patterns.  Can then consider change of therapy to bring down A1C without  lows.  .  FOLLOW UP  .  4-6 weeks f/u,  nutrition referral  .  .  Appointment Provider: [name], NP  .  Electronically signed by Paulla Dolly , MD on  03/17/2016 at 08:59 AM EST  .  CONFIRMATORY SIGN OFF  .  [name]  Document electronically signed by [name]  NP  .

## 2016-03-16 ENCOUNTER — Ambulatory Visit

## 2016-03-16 NOTE — Telephone Encounter (Signed)
PHONE NOTE     CALL INFORMATION:     Person Calling: christine   Relationship to pt: [facility]   PCP: [facility]   Day Phone #: [phone]      CALL DETAILS:   Reporta Ptstates she needs to be discharged due to starting outpatient physical therapy   Call Taken by: ......................................Marland KitchenNancy Nordmann  March 16, 2016 12:49 PM         RESPONSE/ORDERS:  spoke with Wynona Canes - she will call Dr [name] as well.......................................Marland KitchenClayborne Artist, RN  March 16, 2016 1:15 PM                 ORDERS/PROBS/MEDS/ALL     Problems:   ANNUAL EXAM (ICD-V72.31) (ICD10-Z00.00)  MAMMOGRAPHIC SCREENING FOR BREAST CANCER (ICD-V76.12) (ICD10-Z12.31)  DIABETES MELLITUS, TYPE II (ICD-250.00) (ICD10-E11.9)  LONG-TERM (CURRENT) USE OF STEROIDS (ICD-V58.65) (ICD10-Z79.51)  OSTEOPOROSIS SCREENING (ICD-V82.81) (ICD10-Z13.820)  TRANSAMINASES, SERUM, ELEVATED (ICD-790.4) (ICD10-R74.0)  OBESITY (ICD-278.00) (ICD10-E66.9)      DIABETES MELLITUS, TYPE II, UNCONTROLLED (ICD-250.02) (ICD10-E11.65)      FATTY LIVER DISEASE (ICD-571.8) (ICD10-K76.0)  ? 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)  CARPAL TUNNEL (ICD-354.0) (ICD10-G56.00)  OSTEOARTHRITIS (ICD-715.09) (ICD10-M15.9)  S/P ROTATOR CUFF SURGERY 12/2004 - RT SHOULDER; 2008 LT (ICD-V45.89)  Family Hx of MELANOMA, FAMILY HX (ICD-V16.8) (ICD10-Z80.8)  FRACTURE OF OTHER SPEC SITE,  PATHOLOGIC - MULTIPLE (ICD-733.19)  CHOLECYSTECTOMY AND HERNIA REPAIR (ICD-V45.89)  VITAMIN D DEFICIENCY (ICD-268.9) (ICD10-E55.9)  VACCINATION WITH TDAP  (ICD-V06.8) ([id number])  COLONOSCOPY (ICD-V76.51) (ICD10-Z01.89)    Meds (prior to this call):   PREDNISONE 10 MG ORAL TABLET (PREDNISONE) TK 1 T PO  ONCE Daily  LEVOTHYROXINE SODIUM 25 MCG ORAL TABLET (LEVOTHYROXINE SODIUM) Take one tablet by mouth once daily  OMEPRAZOLE 20 MG ORAL CAPSULE DELAYED RELEASE (OMEPRAZOLE) Take one capsule by mouth twice a day  IBUPROFEN 600 MG ORAL TABLET (IBUPROFEN) 1 tab by mouth TID as needed for pain  BACLOFEN 10 MG ORAL TABLET (BACLOFEN) Please take half to one tablet as needed for back spasms  DICLOFENAC SODIUM 1 % TRANSDERMAL GEL (DICLOFENAC SODIUM) APP 2 GRAMS EXT AA BID  PROAIR HFA 108 (90 Base) MCG/ACT INHALATION AEROSOL SOLUTION (ALBUTEROL SULFATE) Inhale 2 puffs 4 times a day as needed for wheezing  LOTRISONE 1-0.05 % EXTERNAL CREAM (CLOTRIMAZOLE-BETAMETHASONE) apply to affected area on ankle twice a day      FREESTYLE FREEDOM LITE w/Device KIT (BLOOD GLUCOSE MONITORING SUPPL) use as directed (ICD 10 E11.65)      FREESTYLE LITE TEST IN VITRO STRIP (GLUCOSE BLOOD) check fingerstick three times a day (ICD 10 E11.65)      FREESTYLE LANCETS (LANCETS) check fingerstick three times a day (ICD 10 E11.65)  GLUCOPHAGE XR 500 MG ORAL TABLET EXTENDED RELEASE 24 HOUR (METFORMIN HCL) take four tablets by mouth daily every morning  ROSUVASTATIN CALCIUM 10 MG ORAL TABLET (ROSUVASTATIN CALCIUM) take one tablet by mouth daily  GLIPIZIDE ER 2.5 MG ORAL TABLET EXTENDED RELEASE 24 HOUR (GLIPIZIDE) take one tablet daily  TRAMADOL HCL 50 MG ORAL TABLET (TRAMADOL HCL) take one tablet daily as needed for pain  OXYCODONE HCL 5 MG ORAL TABLET (OXYCODONE HCL) Partial fill upon patient request.  Take one  tablet daily as needed for pain >8  CALCIUM CARBONATE-VITAMIN D 600-200 MG-UNIT ORAL TABLET (CALCIUM CARBONATE-VITAMIN D) take one tablet twice a day  LIDODERM 5 % EXTERNAL PATCH (LIDOCAINE) apply one patch daily to affected area          Created By [name] Nordmann on 03/16/2016 at 12:48  PM    Electronically Signed By Clayborne Artist RN on 03/16/2016 at 01:15 PM

## 2016-03-17 ENCOUNTER — Ambulatory Visit

## 2016-03-17 ENCOUNTER — Ambulatory Visit: Admitting: Internal Medicine

## 2016-03-17 ENCOUNTER — Ambulatory Visit: Admit: 2016-03-17 | Payer: 59

## 2016-03-21 ENCOUNTER — Ambulatory Visit

## 2016-03-21 NOTE — Telephone Encounter (Signed)
Call Details:   [name] (Age: 55 Years Old)  Phone -- Home: [phone] Cell: [phone]    called on March 21, 2016 11:36 AM.  Call placed by: [name], MD (JR 5)  Call Reason(s): Results  Discussed results of mammogram with patient and follow up plan. She is due to get the results in the mail from [facility] and will check in with me again once her follow up diagnostic imaging is set up. patient also informed me that she is sick with diarrhea and likely has viral gastroenteritis. I advised her to stay hydrated and call back if she isn't feeling better by the end of the week. .......................................[name], MD (JR 5)  March 21, 2016 11:39 AM          ** REQUESTING RESULTS.  Results for/from:     ---------- ---------- ---------- ---------- ---------- ----------       RESPONSE/ORDERS:                 ORDERS/PROBS/MEDS/ALL     Problems:   ANNUAL EXAM (ICD-V72.31) (ICD10-Z00.00)  MAMMOGRAPHIC SCREENING FOR BREAST CANCER (ICD-V76.12) (ICD10-Z12.31)  DIABETES MELLITUS, TYPE II (ICD-250.00) (ICD10-E11.9)  LONG-TERM (CURRENT) USE OF STEROIDS (ICD-V58.65) (ICD10-Z79.51)  OSTEOPOROSIS SCREENING (ICD-V82.81) (ICD10-Z13.820)  TRANSAMINASES, SERUM, ELEVATED (ICD-790.4) (ICD10-R74.0)  OBESITY (ICD-278.00) (ICD10-E66.9)      DIABETES MELLITUS, TYPE II, UNCONTROLLED (ICD-250.02) (ICD10-E11.65)      FATTY LIVER DISEASE (ICD-571.8) (ICD10-K76.0)  ? 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)  CARPAL TUNNEL (ICD-354.0) (ICD10-G56.00)  OSTEOARTHRITIS (ICD-715.09)  (ICD10-M15.9)  S/P ROTATOR CUFF SURGERY 12/2004 - RT SHOULDER; 2008 LT (ICD-V45.89)  Family Hx of MELANOMA, FAMILY HX (ICD-V16.8) (ICD10-Z80.8)  FRACTURE OF OTHER SPEC SITE,  PATHOLOGIC - MULTIPLE (ICD-733.19)  CHOLECYSTECTOMY AND HERNIA REPAIR (ICD-V45.89)  VITAMIN D DEFICIENCY (ICD-268.9) (ICD10-E55.9)  VACCINATION WITH TDAP (ICD-V06.8) (PYP95-K93)  COLONOSCOPY (ICD-V76.51) (ICD10-Z01.89)    Meds (prior to this call):   PREDNISONE 10 MG ORAL TABLET (PREDNISONE) TK 1 T PO  ONCE Daily  LEVOTHYROXINE SODIUM 25 MCG ORAL TABLET (LEVOTHYROXINE SODIUM) Take one tablet by mouth once daily  OMEPRAZOLE 20 MG ORAL CAPSULE DELAYED RELEASE (OMEPRAZOLE) Take one capsule by mouth twice a day  IBUPROFEN 600 MG ORAL TABLET (IBUPROFEN) 1 tab by mouth TID as needed for pain  BACLOFEN 10 MG ORAL TABLET (BACLOFEN) Please take half to one tablet as needed for back spasms  DICLOFENAC SODIUM 1 % TRANSDERMAL GEL (DICLOFENAC SODIUM) APP 2 GRAMS EXT AA BID  PROAIR HFA 108 (90 Base) MCG/ACT INHALATION AEROSOL SOLUTION (ALBUTEROL SULFATE) Inhale 2 puffs 4 times a day as needed for wheezing  LOTRISONE 1-0.05 % EXTERNAL CREAM (CLOTRIMAZOLE-BETAMETHASONE) apply to affected area on ankle twice a day      FREESTYLE FREEDOM LITE w/Device KIT (BLOOD GLUCOSE MONITORING SUPPL) use as directed (ICD 10 E11.65)      FREESTYLE LITE TEST IN VITRO STRIP (GLUCOSE BLOOD) check fingerstick three times a day (ICD 10 E11.65)      FREESTYLE LANCETS (LANCETS) check fingerstick three times a day (ICD 10 E11.65)  GLUCOPHAGE XR 500 MG ORAL TABLET EXTENDED RELEASE 24 HOUR (METFORMIN HCL) take four tablets by mouth daily every morning  ROSUVASTATIN CALCIUM  10 MG ORAL TABLET (ROSUVASTATIN CALCIUM) take one tablet by mouth daily  GLIPIZIDE ER 2.5 MG ORAL TABLET EXTENDED RELEASE 24 HOUR (GLIPIZIDE) take one tablet daily  TRAMADOL HCL 50 MG ORAL TABLET (TRAMADOL HCL) take one tablet daily as needed for pain  OXYCODONE HCL 5 MG ORAL TABLET (OXYCODONE HCL) Partial fill upon  patient request.  Take one tablet daily as needed for pain >8  CALCIUM CARBONATE-VITAMIN D 600-200 MG-UNIT ORAL TABLET (CALCIUM CARBONATE-VITAMIN D) take one tablet twice a day  LIDODERM 5 % EXTERNAL PATCH (LIDOCAINE) apply one patch daily to affected area          Created By [name], MD on 03/21/2016 at 11:36 AM    Electronically Signed By [name], MD on 03/21/2016 at 11:39 AM

## 2016-03-22 ENCOUNTER — Ambulatory Visit: Admitting: Hand Surgery

## 2016-03-22 ENCOUNTER — Ambulatory Visit: Admitting: Internal Medicine

## 2016-03-22 ENCOUNTER — Ambulatory Visit

## 2016-03-22 ENCOUNTER — Ambulatory Visit: Admit: 2016-03-22 | Payer: 59

## 2016-03-22 NOTE — Progress Notes (Signed)
* * *        **  [name]**    --- ---    41 Y old Female, DOB: 30-Oct-1960    [address], [place] [address]    Home: [phone]    Provider: Yetta Numbers        * * *    Telephone Encounter    ---    Answered by   Archer Asa  Date: 03/22/2016         Time: 11:03 AM    Caller   [name]    --- ---            Reason   Call back            Message                      Good morning,      The patient would like a call back regarding work status. The patients job never received the paperwork. Please call the patient back at [phone]            Thank you=)                Action Taken   Allen,Kara 03/22/2016 11:06:48 AM > Persico,Claudio 03/22/2016  12:11:25 PM > , Action - pt telephoned. Left message                * * *                ---          * * *          Patient: [name], [name] DOB: 08/04/1960 Provider: Yetta Numbers 03/22/2016    ---    Note generated by eClinicalWorks EMR/PM Software ([email or web address])

## 2016-03-24 ENCOUNTER — Ambulatory Visit: Admitting: Rheumatology

## 2016-03-24 ENCOUNTER — Ambulatory Visit

## 2016-03-24 ENCOUNTER — Ambulatory Visit: Admit: 2016-03-24 | Payer: 59

## 2016-03-24 MED ORDER — Pantoprazole Sodium: 40 | 30 | Freq: Every day | 3 refills | 0 days | Status: AC

## 2016-03-24 NOTE — Progress Notes (Signed)
.  Progress Notes  .  Patient: [name], [name]  Provider: [name]  DOB: 09-16-1960 Age: 55 Y Sex: Female  .  PCP: [name]  MD  Date: 03/24/2016  .  --------------------------------------------------------------------------------  .  REASON FOR APPOINTMENT  .  1. Joint pain  .  HISTORY OF PRESENT ILLNESS  .  GENERAL:   She is still experiencing bilateral hand pain in her PIP and MCP  joints. The pain improved with steroids but she ahs been unable  to taper. She never got TNF inhibitor due to some insurance  issues. She denies any fevers chills or recent infections. No  other joint pains.  .  CURRENT MEDICATIONS  .  Taking Baclofen 10 MG Tablet 1/2 tab Orally prn  Taking Calcium Carbonate-Vitamin D 600-200 MG-UNIT Capsule 1  capsule with a meal Orally Twice a day  Taking Enbrel SureClick 50 MG/ML Solution Auto-injector inject  50mg  (1 pen) sc Subcutaneous every week, Notes: not yet, still  pending  Taking GlipiZIDE XL 5 MG Tablet Extended Release 24 Hour 1 tablet  Orally Once a day  Taking Glucophage XR 500 mg Tablet Extended Release 24 Hour 4  tablets Orally Once a day  Taking Hydroxychloroquine Sulfate 200 mg Tablet 1 tablet with  food or milk Orally Once a day  Taking Levothyroxine Sodium 25 MCG Tablet 1 tablet Orally Once a  day  Taking Omeprazole 40 mg Capsule Delayed Release 1 capsule Orally  Once a day  Taking PredniSONE 5 mg Tablet 2 tablet Orally Once a day  Taking Rosuvastatin Calcium 10 MG Tablet 1 tablet Orally Once a  day  Taking Tramadol HCl 50 MG Tablet 1 tablet Orally every 6 hrs  Taking Voltaren 1 % Gel 2 grams Transdermal twice daily, Notes:  PRN  Medication List reviewed and reconciled with the patient  .  PAST MEDICAL HISTORY  .  Scleroderma - CREST dx 2007  IgG4 deficiency s/p IVIG 2010 ----single infusion given  preventively after week of bilateral knee replacements at [facility]  2010  Fx left wrist in 1994  Blood clot at LUE in 2006 on a short course of Coumadin  Bilateral carpal  tunnel syndrome s/p Lt carpal tunnel release and  steroids injection right--  Bone spur left foot  Scoliosis  Spinal stenosis s/p steroids injection  s/p bilateral knee replacements for valgus /arthritic  complications, performed by Dr. Katrinka Blazing 2010--- never infected, but  packed with antibiotics with surgery;  Right rotator cuff repairs x 4, complicated by repeat tears,  infection, placement of anchor material  Elevated liver function tests  Diabetes  .  ALLERGIES  .  N.K.D.A.  .  SOCIAL HISTORY  .  .  Tobacco  history: Never smoked.  .  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  .  Alcohol Former daily EtOH use in 20s.  .  Nonsmoker.Lives with longstanding boyfriend.  [name]  REVIEW OF SYSTEMS  .  ADULT Rheumatology ROS :  .  Constitutional    No Recent weight gain, Fatigue, Chills . Eyes     No Pain, Loss of vision, Itching eyes . HENT    + , Dryness of  mouth . Respiratory    No Shortness of breath, Cough, Hemoptysis  . Gastrointestinal    + , Heartburn . Genitourinary    No  Difficult urination, Pus in urine, Blood in urine .  Musculoskeletal    No, Morning stiffness, Arthralgias,  Arthralgias . Integumentary (skin and/or  breast)    No Easy  bruising, Rash, Sun sensitive (sun allergy) . Neurological System     No, Headaches, Dizziness, Fainting . Psychiatric    No  Excessive worries, Depression, Night sweats . Endocrine    No  Excessive thirst . Hematologic/Lymphatic    No Swollen glands,  Anemia, Lymphadenopathy . Allergic/Immunologic    No Frequent  sneezing, Increased susceptibility to infection, Raynauds . Heart     No Chest Pain, Heart murmur, Irregular heart beat .  [name]  VITAL SIGNS  .  Pain scale 5, Ht-in 62, Wt-lbs 208, BMI 38.04, BP 152/98, HR 85.  [name]  EXAMINATION  .  RAPID 3: Function(0-10): 0.7. Pain(0-10):5.0.  Patient Global(0-10):1.0.  .  ASSESSMENTS  .  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  Elevated liver enzymes - R74.8  .  Scleroderma - M34.9  .  She  still has severe synovitis. 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 There is also significant  risk of infection due to her IgG4 deficiency. She has not  required IVIG for a long time. We again discussed the risks and  benefits of various therapies and settled on a trial of enbrel.  We will re-apply for PA of this medication. With respect to  steroids, I recommended she stay at her current dose until the  enbrel starts working, 4-6 weeks from now. She is having more  GERD despite PPI with omeprazole, therefore will try protonix.  [name]  TREATMENT  .  Seronegative rheumatoid arthritis  Stop Omeprazole Capsule Delayed Release, 40 mg, 1 capsule,  Orally, Once a day  Refill Enbrel SureClick Solution Auto-injector, 50 MG/ML, inject  50mg  (1 pen) sc, Subcutaneous, every week, 28 days, 4, Refills 3,  Notes: not yet, still pending  Continue Hydroxychloroquine Sulfate Tablet, 200 mg, 1 tablet with  food or milk, Orally, Once a day  Continue Tramadol HCl Tablet, 50 MG, 1 tablet, Orally, every 6  hrs  .  [name]  Others  Start Pantoprazole Sodium Tablet Delayed Release, 40 mg, take 1  tablet, Orally, Once a day, 30 day(s), 30, Refills 3  .  FOLLOW UP  .  2 Months  .  Electronically signed by Erasmo Leventhal , MD on  04/08/2016 at 06:35 PM EST  .  Document electronically signed by [name]

## 2016-03-24 NOTE — Progress Notes (Signed)
* * *        [name]**    --- ---    82 Y old Female, DOB: 06/05/1960, External MRN: [id number]    Account Number: [id number]    [address] [name], [place]    Home: [phone]    Guarantor: [name] Insurance: NHP [place]    PCP: [name], MD Referring: [name]    Appointment Facility: Rheumatology        * * *    03/24/2016  Progress Notes: [name], MD **CHN#:** [phone]    --- ---    ---        Reason for Appointment    ---      1\. Joint pain    ---      History of Present Illness    ---     _GENERAL_ :    She is still experiencing bilateral hand pain in her PIP and MCP joints. The  pain improved with steroids but she ahs been unable to taper. She never got  TNF inhibitor due to some insurance issues. She denies any fevers chills or  recent infections. No other joint pains.      Current Medications    ---    Taking     * Baclofen 10 MG Tablet 1/2 tab Orally prn    ---    * Calcium Carbonate-Vitamin D 600-200 MG-UNIT Capsule 1 capsule with a meal Orally Twice a day    ---    * Enbrel SureClick 50 MG/ML Solution Auto-injector inject 50mg  (1 pen) sc Subcutaneous every week, Notes: not yet, still pending    ---    * GlipiZIDE XL 5 MG Tablet Extended Release 24 Hour 1 tablet Orally Once a day    ---    * Glucophage XR 500 mg Tablet Extended Release 24 Hour 4 tablets Orally Once a day    ---    * Hydroxychloroquine Sulfate 200 mg Tablet 1 tablet with food or milk Orally Once a day    ---    * Levothyroxine Sodium 25 MCG Tablet 1 tablet Orally Once a day    ---    * Omeprazole 40 mg Capsule Delayed Release 1 capsule Orally Once a day    ---    * PredniSONE 5 mg Tablet 2 tablet Orally Once a day    ---    * Rosuvastatin Calcium 10 MG Tablet 1 tablet Orally Once a day    ---    * Tramadol HCl 50 MG Tablet 1 tablet Orally every 6 hrs    ---    * Voltaren 1 % Gel 2 grams Transdermal twice daily, Notes: PRN    ---    * Medication List reviewed and reconciled with the patient     ---      Past Medical History    ---       Scleroderma - CREST dx 2007.        ---    IgG4 deficiency s/p IVIG 2010 ----single infusion given preventively after  week of bilateral knee replacements at [facility] 2010.        ---    Fx left wrist in 1994.        ---    Blood clot at LUE in 2006 on a short course of Coumadin.        ---    Bilateral carpal tunnel syndrome s/p Lt carpal tunnel release and  steroids  injection right--.        ---    Bone spur left foot.        ---    Scoliosis.        ---    Spinal stenosis s/p steroids injection.        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 Katrinka Blazing 2010--- never infected, but packed with antibiotics with  surgery;.        ---    Right rotator cuff repairs x 4, complicated by repeat tears, infection,  placement of anchor material.        ---    Elevated liver function tests.        ---    Diabetes.        ---      Social History    ---    Tobacco history: Never smoked.    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    Alcohol  Former daily EtOH use in 20s.   Nonsmoker.    Lives with longstanding boyfriend.    ---      Allergies    ---      N.K.D.A.    ---      Review of Systems    ---     _ADULT Rheumatology ROS_ :    Constitutional No Recent weight gain, Fatigue, Chills. Eyes No Pain, Loss of  vision, Itching eyes. HENT **+ , Dryness of mouth**. Respiratory No Shortness  of breath, Cough, Hemoptysis. Gastrointestinal + ******, Heartburn**.  Genitourinary No Difficult urination, Pus in urine, Blood in urine.  Musculoskeletal No, Morning stiffness, Arthralgias, Arthralgias. Integumentary  (skin and/or breast) No Easy bruising, Rash, Sun sensitive (sun allergy).  Neurological System No, Headaches, Dizziness, Fainting. Psychiatric No  Excessive worries, Depression, Night sweats. Endocrine No Excessive thirst.  Hematologic/Lymphatic No Swollen glands, Anemia, Lymphadenopathy.  Allergic/Immunologic No Frequent sneezing,  Increased susceptibility to  infection, Raynauds. Heart No Chest Pain, Heart murmur, Irregular heart beat.          Vital Signs    ---    Pain scale 5, Ht-in 62, Wt-lbs 208, BMI 38.04, BP 152/98, HR 85.      Examination    ---     _RAPID 3_ :    Function (0-10): 0.7.        Pain (0-10): 5.0.        Patient Global (0-10): 1.0.          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 Elevated liver enzymes - R74.8    ---    4\. Scleroderma - M34.9    ---      She still has severe synovitis. 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 There is also significant risk of infection due  to her IgG4 deficiency. She has not required IVIG for a long time. We again  discussed the risks and benefits of various therapies and settled on a trial  of enbrel. We will re-apply for PA of this medication. With respect to  steroids, I recommended she stay at her current dose until the enbrel starts  working, 4-6 weeks from now. She is having more GERD despite PPI with  omeprazole, therefore will try protonix.    ---  Treatment    ---       **1\. Seronegative rheumatoid arthritis**    Stop Omeprazole Capsule Delayed Release, 40 mg, 1 capsule, Orally, Once a day    Refill Enbrel SureClick Solution Auto-injector, 50 MG/ML, inject 50mg  (1 pen)  sc, Subcutaneous, every week, 28 days, 4, Refills 3, Notes: not yet, still  pending    Continue Hydroxychloroquine Sulfate Tablet, 200 mg, 1 tablet with food or  milk, Orally, Once a day    Continue Tramadol HCl Tablet, 50 MG, 1 tablet, Orally, every 6 hrs    ---         **2\. Others**    Start Pantoprazole Sodium Tablet Delayed Release, 40 mg, take 1 tablet,  Orally, Once a day, 30 day(s), 30, Refills 3      Follow Up    ---    2 Months    Electronically signed by Erasmo Leventhal , MD on 04/08/2016 at 06:35 PM EST    Sign off status: Completed        * * *        Rheumatology    [address]    [place], [place] [address]    Tel: [phone]    Fax: [phone]              * * *          Patient: [name], [name] DOB: [birth year] Progress Note: [name], MD  03/24/2016    ---    Note generated by eClinicalWorks EMR/PM Software ([email or web address])

## 2016-03-29 ENCOUNTER — Ambulatory Visit

## 2016-04-10 ENCOUNTER — Ambulatory Visit: Admitting: Registered"

## 2016-04-10 ENCOUNTER — Ambulatory Visit

## 2016-04-10 ENCOUNTER — Ambulatory Visit: Admit: 2016-04-10 | Payer: 59

## 2016-04-12 ENCOUNTER — Ambulatory Visit: Admitting: Adult Health

## 2016-04-12 ENCOUNTER — Ambulatory Visit: Admitting: "Endocrinology

## 2016-04-12 ENCOUNTER — Ambulatory Visit

## 2016-04-12 ENCOUNTER — Ambulatory Visit: Admitting: Hand Surgery

## 2016-04-12 ENCOUNTER — Ambulatory Visit: Admit: 2016-04-12 | Payer: No Typology Code available for payment source

## 2016-04-12 NOTE — Progress Notes (Signed)
.  Progress Notes  .  Patient: [name]  Provider: [name]  NP  .  DOB: 1960/06/23 Age: 56 Y Sex: Female  Supervising Provider:: Paulla Dolly, MD  Date: 04/12/2016  .  PCP: [name]  MD  Date: 04/12/2016  .  --------------------------------------------------------------------------------  .  REASON FOR APPOINTMENT  .  1. IN-PT FU/DIABETES  .  HISTORY OF PRESENT ILLNESS  .  General:  56 y/o female with scleroderma (on  prednisone), IgG-4 deficiency, NASH and uncontrolled T2DM. Seen  for initial visit on 03/15/16 for management of hyperglycemia in  diabetes. Since her last visit with me she has seen nutritionist  Consuella Lose and is working on her diet. She is also checking  blood sugars routinely and documenting these with an accompanying  food log. Compliant to her current glipizide and metformin doses.  Diabetes Meds:  .  glipizide 5mg   metformin 2000mg  daily  no previous medications or insulin injections  -----  reports hypoglycemia 40-60's range on a higher dose of  glipizide-10mg .  .  A1c Trend:  .  8.7%-----03/02/16  11%-----08/05/15  7.1%-----11/04/14  7%-----12/31/13  6.6%-----05/06/13.  [name]  Recent Blood Glucoses:  .  checking some blood sugars now at various times of the day  meter download via glooko (04/12/16)  30 days/38 readings/1.3 per day  range: 96-406  average: 266  24% in range, 76% above range.  .  Diet:  .  appetite is good  working on reducing fat/cho portions--seen by Nutrition 04/2016.  [name]  Exercise:  .  walks 1-2 mi daily with dog.  .  Microvascular Complications Screening:  .  eyes: no acute vision changes, no hx of retinopathy, reports  annual exam with ophthalmologist is scheduled  kidneys: creat:0.89, egfr:74, urine albumin:<0.5  feet: denies numbess or parethesias in feet, seen by Dr. Vassie Loll-  hammering of the lesser toes, given rx for custom orthotics.  .  Macrovascular Complications Screening:  .  no hx of MI, CVA or PVD  (+) hld  Lipids: TC:286, TG:179, HDL:65, LDL:185  (08/05/15)--started on 10mg   rosuvastatin after this.  .  CURRENT MEDICATIONS  .  Taking Baclofen 10 MG Tablet 1/2 tab Orally prn  Taking Calcium Carbonate-Vitamin D 600-200 MG-UNIT Capsule 1  capsule with a meal Orally Twice a day  Taking Enbrel SureClick 50 MG/ML Solution Auto-injector inject  50mg  (1 pen) sc Subcutaneous every week, Notes: not yet, still  pending  Taking GlipiZIDE XL 5 MG Tablet Extended Release 24 Hour 1 tablet  Orally Once a day  Taking Glucophage XR 500 mg Tablet Extended Release 24 Hour 4  tablets Orally Once a day  Taking Hydroxychloroquine Sulfate 200 mg Tablet 1 tablet with  food or milk Orally Once a day  Taking Levothyroxine Sodium 25 MCG Tablet 1 tablet Orally Once a  day  Taking Pantoprazole Sodium 40 mg Tablet Delayed Release take 1  tablet Orally Once a day  Taking PredniSONE 5 mg Tablet 2 tablet Orally Once a day  Taking Rosuvastatin Calcium 10 MG Tablet 1 tablet Orally Once a  day  Taking Tramadol HCl 50 MG Tablet 1 tablet Orally every 6 hrs  Taking Voltaren 1 % Gel 2 grams Transdermal twice daily, Notes:  PRN  Medication List reviewed and reconciled with the patient  .  PAST MEDICAL HISTORY  .  Scleroderma - CREST dx 2007  IgG4 deficiency s/p IVIG 2010 ----single infusion given  preventively after week of bilateral knee replacements at [facility]  2010  Fx left wrist in 1994  Blood clot at LUE in 2006 on a short course of Coumadin  Bilateral carpal tunnel syndrome s/p Lt carpal tunnel release and  steroids injection right--  Bone spur left foot  Scoliosis  Spinal stenosis s/p steroids injection  s/p bilateral knee replacements for valgus /arthritic  complications, performed by Dr. Katrinka Blazing 2010--- never infected, but  packed with antibiotics with surgery;  Right rotator cuff repairs x 4, complicated by repeat tears,  infection, placement of anchor material  Elevated liver function tests  Diabetes  .  ALLERGIES  .  N.K.D.A.  .  SURGICAL HISTORY  .  Right rotator cuff repair, with infected  hardware that had to be  removed 2006  Repeat right shoulder surgery, also which became infected. 2007  Left rotator cuff repair 2008  bilateral knee replacements 2009  Left arthroscopic carpal tunnel release 01/2011  ORIF Left 4th metatarsal bone  Left Ulna shortening 1994  Knee replacement 2010  Hand surgery 2013, 2015  remove gallbladder/hernia 1990  Plate left foot 3rd metatarsal 2008  .  FAMILY HISTORY  .  Mother: deceased 61 yrs, lung cancer, hyperthyroidism, diagnosed  with Cancer  Father: deceased 66s yrs, heart attack, diagnosed with Heart  Disease  3 brother(s) .  FH of arthritis and sclerodermaFather deceased from MIMother  deceased age 4 with lung cancer Brother with scleroderma / copd.  .  SOCIAL HISTORY  .  .  Tobacco  history: Never smoked.  [name]  Work/Occupation: Administrator at fitness center.  .  .  Alcohol  Former daily EtOH use in 20s  .  Nonsmoker.Lives with longstanding boyfriend.  [name]  HOSPITALIZATION/MAJOR DIAGNOSTIC PROCEDURE  .  as above  .  REVIEW OF SYSTEMS  .  Diabetes:  .  General:    no unintended weight change, no fevers, malaise,  weakness . Eyes:    no vision loss . Cardiovascular:    no CP, no  orthopnea, no palpitations . Respiratory:    no wheezing, no  dyspnea . Gastrointestinal:    no n/v, no abdominal pain .  Musculoskeletal:    no edema . Skin:    no rash . Neurological:     no numbness .  [name]  VITAL SIGNS  .  Pain scale 0, Ht-in 62, Wt-lbs 207, BMI 37.86, BP 135/89, HR 98,  BSA 2.02, Ht-cm 157.48, Wt-kg 93.89, Oxygen sat % 98.  [name]  PHYSICAL EXAMINATION  .  Diabetes:  Gen:  pleasant,alert and oriented x3, no distress.  Neck:  no lymphadenopathy.  Cardio:  S1S2, heart rate is regular.  Resp:  clear to auscultation bilaterally.  Musl:  no lower extremity edema bilaterally.  >50% of this 20 minute visit spent counseling the patient.  .  ASSESSMENTS  .  Other hyperlipidemia - E78.4 (Primary)  .  Type 2 diabetes mellitus with hyperglycemia, without long-term  current use of insulin -  E11.65  .  Obesity (BMI 30-39.9) - E66.9  .  56 y/o female with T2DM, recently with worsening hyperglycemia.  Prior hemoglobin a1c on 03/02/16 was 8.7%. Today Ms. Dunsworth  returns for review of her blood glucose readings. She is starting  to track her blood sugars and log her food intake. Pt has also  been seen by Nutrition which she found very helpful. By review of  her blood glucose readings, fasting sugars have been improving  over the past 1 week with reduced portions and changes in  food  portions at dinner time. She is still experiencing some  postprandial/daytime high sugars. Pt reports motivation to  continue to work on diet and increase her physical activity. I  will continue her maximum dose of metformin and also the current  dose of glipizde given her documented hypoglycemia on 10mg  daily.  She will see me again in 6-8 weeks to track her progress. I did  discuss with her potential add on therapy today and these  options. She does not want to consider injectable therapy, though  says she would consider if she is unsuccessful at improving blood  glucose on her own over the next few months. SGLT2 may be a  reasonable option given that this is oral medication, minimal  risk of hypoglycemia and potential for modest weight loss. Will  consider this if sugars remain high. Will need to consider  insulin if sugars trend higher. She is aware.  .  TREATMENT  .  Type 2 diabetes mellitus with hyperglycemia,  without long-term current use of insulin  Continue GlipiZIDE XL Tablet Extended Release 24 Hour, 5 MG, 1  tablet, Orally, Once a day  Continue Glucophage XR Tablet Extended Release 24 Hour, 500 mg, 4  tablets, Orally, Once a day  .  [name]  Others  Notes: I have reviewed the findings, assessment and treatment  plan with [name], NP and have edited Progress Note as  required. Working on diet to bring sugars down. No change in meds  for now.  .  FOLLOW UP  .  6-8 weeks  .  [name]  Appointment Provider: [name],  NP  .  Electronically signed by Paulla Dolly , MD on  04/13/2016 at 12:56 PM EST  .  CONFIRMATORY SIGN OFF  .  [name]  Document electronically signed by [name]  NP  .

## 2016-04-12 NOTE — Progress Notes (Signed)
* * *        **  [name]**    --- ---    89 Y old Female, DOB: Aug 21, 1960    [address] [name], [place] [address]    Home: [phone]    Provider: Yetta Numbers        * * *    Telephone Encounter    ---    Answered by   Harley Alto  Date: 04/12/2016         Time: 04:41 PM    Caller   [name]    --- ---            Reason   Fax Follow Up form to Ins            Message                      Hello,             The patient called in to see if you received a follow up form from her  insurance fax  on  12/28 th. If you do have the form, the fax number to send to is [phone]. Patient requests a call back if's it is done or if you don't have the forms. Her cell is l [phone]                Action Taken   Viaud,Berdine 04/12/2016 4:46:23 PM > Persico,Claudio 04/13/2016  12:37:17 PM > , Action - Pt telephoned. Spoke to pt. FORMS were faxed on  04/12/16                * * *                ---          * * *          Patient: [name], [name] DOB: [birth year] Provider: Yetta Numbers 04/12/2016    ---    Note generated by eClinicalWorks EMR/PM Software ([email or web address])

## 2016-04-12 NOTE — Progress Notes (Signed)
* * *        [name], Kenna**    --- ---    8 Y old Female, DOB: 08/03/1960, External MRN: [id number]    Account Number: [id number]    [address], [id number]    Home: [phone]    Guarantor: [name] Insurance: [employer] [place] Payer ID: PAPER    PCP: [name], MD Referring: Fortunato Curling, MD External Visit ID:  [id number]    Appointment Facility: Endocrinology        * * *    04/12/2016   **Appointment Provider:** [name], NP **CHN#:** [id number]    --- ---      **Supervising Provider:** Paulla Dolly, MD    ---        Current Medications    ---    Taking     * Baclofen 10 MG Tablet 1/2 tab Orally prn    ---    * Calcium Carbonate-Vitamin D 600-200 MG-UNIT Capsule 1 capsule with a meal Orally Twice a day    ---    * Enbrel SureClick 50 MG/ML Solution Auto-injector inject 50mg  (1 pen) sc Subcutaneous every week, Notes: not yet, still pending    ---    * GlipiZIDE XL 5 MG Tablet Extended Release 24 Hour 1 tablet Orally Once a day    ---    * Glucophage XR 500 mg Tablet Extended Release 24 Hour 4 tablets Orally Once a day    ---    * Hydroxychloroquine Sulfate 200 mg Tablet 1 tablet with food or milk Orally Once a day    ---    * Levothyroxine Sodium 25 MCG Tablet 1 tablet Orally Once a day    ---    * Pantoprazole Sodium 40 mg Tablet Delayed Release take 1 tablet Orally Once a day    ---    * PredniSONE 5 mg Tablet 2 tablet Orally Once a day    ---    * Rosuvastatin Calcium 10 MG Tablet 1 tablet Orally Once a day    ---    * Tramadol HCl 50 MG Tablet 1 tablet Orally every 6 hrs    ---    * Voltaren 1 % Gel 2 grams Transdermal twice daily, Notes: PRN    ---    * Medication List reviewed and reconciled with the patient    ---      Past Medical History    ---       Scleroderma - CREST dx 2007.        ---    IgG4 deficiency s/p IVIG 2010 ----single infusion given preventively after  week of bilateral knee replacements at [facility] 2010.        ---    Fx left wrist in 1994.        ---    Blood clot at  LUE in 2006 on a short course of Coumadin.        ---    Bilateral carpal tunnel syndrome s/p Lt carpal tunnel release and steroids  injection right--.        ---    Bone spur left foot.        ---    Scoliosis.        ---    Spinal stenosis s/p steroids injection.        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 Katrinka Blazing 2010--- never infected, but  packed with antibiotics with  surgery;.        ---    Right rotator cuff repairs x 4, complicated by repeat tears, infection,  placement of anchor material.        ---    Elevated liver function tests.        ---    Diabetes.        ---      Surgical History    ---      Right rotator cuff repair, with infected hardware that had to be removed  2006    ---    Repeat right shoulder surgery, also which became infected. 2007    ---    Left rotator cuff repair 2008    ---    bilateral knee replacements 2009    ---    Left arthroscopic carpal tunnel release 01/2011    ---    ORIF Left 4th metatarsal bone    ---    Left Ulna shortening 1994    ---    Knee replacement 2010    ---    Hand surgery 2013, 2015    ---    remove gallbladder/hernia 1990    ---    Plate left foot 3rd metatarsal 2008    ---      Family History    ---      Mother: deceased 56 yrs, lung cancer, hyperthyroidism, diagnosed with Cancer    ---    Father: deceased 64s yrs, heart attack, diagnosed with Heart Disease    ---    3 brother(s) .    ---    FH of arthritis and scleroderma    Father deceased from MI    Mother deceased age 36 with lung cancer    Brother with scleroderma / copd.    ---      Social History    ---    Tobacco history: Never smoked.    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    Alcohol    _Former daily EtOH use in 20s_   Nonsmoker.    Lives with longstanding boyfriend.    ---      Allergies    ---      N.K.D.A.    ---    [name]]      Hospitalization/Major Diagnostic Procedure    ---      as above    ---      Review of  Systems    ---     _Diabetes_ :    General: no unintended weight change, no fevers, malaise, weakness. Eyes: no  vision loss. Cardiovascular: no CP, no orthopnea, no palpitations.  Respiratory: no wheezing, no dyspnea. Gastrointestinal: no n/v, no abdominal  pain. Musculoskeletal: no edema. Skin: no rash. Neurological: no numbness.            Reason for Appointment    ---      1\. IN-PT FU/DIABETES    ---      History of Present Illness    ---     _General_ :    56 y/o female with scleroderma (on prednisone), IgG-4 deficiency, NASH and  uncontrolled T2DM. Seen for initial visit on 03/15/16 for management of  hyperglycemia in diabetes. Since her last visit with me she has seen  nutritionist Consuella Lose and is working on her diet. She is also checking  blood sugars routinely and documenting these with an accompanying food log.  Compliant to her current  glipizide and metformin doses.    Diabetes Meds:    glipizide 5mg     metformin 2000mg  daily    no previous medications or insulin injections    -----    reports hypoglycemia 40-60's range on a higher dose of glipizide-10mg .    A1c Trend:    8.7%-----03/02/16    11%-----08/05/15    7.1%-----11/04/14    7%-----12/31/13    6.6%-----05/06/13.    Recent Blood Glucoses:    checking some blood sugars now at various times of the day    meter download via glooko (04/12/16)    30 days/38 readings/1.3 per day    range: 96-406    average: 266    24% in range, 76% above range.    Diet:    appetite is good    working on reducing fat/cho portions--seen by Nutrition 04/2016.    Exercise:    walks 1-2 mi daily with dog.    Microvascular Complications Screening:    eyes: no acute vision changes, no hx of retinopathy, reports annual exam with  ophthalmologist is scheduled    kidneys: creat:0.89, egfr:74, urine albumin:<0.5    feet: denies numbess or parethesias in feet, seen by Dr. Vassie Loll- hammering of  the lesser toes, given rx for custom orthotics.    Macrovascular Complications  Screening:    no hx of MI, CVA or PVD    (+) hld    Lipids: TC:286, TG:179, HDL:65, LDL:185 (08/05/15)--started on 10mg  rosuvastatin  after this.      Vital Signs    ---    Pain scale 0, Ht-in 62, Wt-lbs 207, BMI 37.86, BP 135/89, HR 98, BSA 2.02, Ht-  cm 157.48, Wt-kg 93.89, Oxygen sat % 98.      Physical Examination    ---     _Diabetes_ :    Gen: pleasant,alert and oriented x3, no distress.    Neck: no lymphadenopathy.    Cardio: S1S2, heart rate is regular.    Resp: clear to auscultation bilaterally.    Musl: no lower extremity edema bilaterally.    >50% of this 20 minute visit spent counseling the patient.      Assessments    ---    1\. Other hyperlipidemia - E78.4 (Primary)    ---    2\. Type 2 diabetes mellitus with hyperglycemia, without long-term current use  of insulin - E11.65    ---    3\. Obesity (BMI 30-39.9) - E66.9    ---      56 y/o female with T2DM, recently with worsening hyperglycemia. Prior  hemoglobin a1c on 03/02/16 was 8.7%. Today Ms. Zaucha returns for review of her  blood glucose readings. She is starting to track her blood sugars and log her  food intake. Pt has also been seen by Nutrition which she found very helpful.  By review of her blood glucose readings, fasting sugars have been improving  over the past 1 week with reduced portions and changes in food portions at  dinner time. She is still experiencing some postprandial/daytime high sugars.  Pt reports motivation to continue to work on diet and increase her physical  activity. I will continue her maximum dose of metformin and also the current  dose of glipizde given her documented hypoglycemia on 10mg  daily. She will see  me again in 6-8 weeks to track her progress. I did discuss with her potential  add on therapy today and these options. She does not want to consider  injectable therapy,  though says she would consider if she is unsuccessful at  improving blood glucose on her own over the next few months. SGLT2 may be a  reasonable  option given that this is oral medication, minimal risk of  hypoglycemia and potential for modest weight loss. Will consider this if  sugars remain high. Will need to consider insulin if sugars trend higher. She  is aware.    ---      Treatment    ---       **1\. Type 2 diabetes mellitus with hyperglycemia, without long-term  current use of insulin**    Continue GlipiZIDE XL Tablet Extended Release 24 Hour, 5 MG, 1 tablet, Orally,  Once a day    Continue Glucophage XR Tablet Extended Release 24 Hour, 500 mg, 4 tablets,  Orally, Once a day    ---         **2\. Others**    Notes: I have reviewed the findings, assessment and treatment plan with [name], NP and have edited Progress Note as required. Working on diet to bring  sugars down. No change in meds for now.      Follow Up    ---    6-8 weeks    **Appointment Provider:** [name], NP    Electronically signed by Paulla Dolly , MD on 04/13/2016 at 12:56 PM EST    Sign off status: Completed        * * *        Endocrinology    [address]    [place], [place] [address]    Tel: [phone]    Fax: [phone]              * * *          Patient: [name], [name] DOB: 11-Apr-1960 Progress Note: [name], NP  04/12/2016    ---    Note generated by eClinicalWorks EMR/PM Software ([email or web address])

## 2016-04-18 ENCOUNTER — Ambulatory Visit: Admitting: Rheumatology

## 2016-04-18 NOTE — Progress Notes (Signed)
* * *        **  [name]**    --- ---    57 Y old Female, DOB: 1961/03/09    [address], [place] [address]    Home: [phone]    Provider: [name]        * * *    Telephone Encounter    ---    Answered by   Barbaraann Rondo  Date: 04/18/2016         Time: 08:41 AM    Caller   pt    --- ---            Reason   Injection site reaction            Message                      Hello, patient states she is having an allergic reaction. She would like to know if [name] would like to start her on another medication. Also patient is currently scheduled for Feb but would like to come in sooner, afternoon preferred. Best contact number [phone]. Thank you.                 Action Taken   [facility] 04/18/2016 8:41:59 AM > KUMAR,SONAM A, PA-C  04/18/2016 9:41:51 AM > redness at injection site on bilateral thigh 3 weeks  after starting etanercept. Warm to touch, not itchy, no pus or drainage, not  painful but achy. Likely an injection site reaction. Asked pt to take benedryl  or other allergy medication prior to next injection as well as today and to  apply ice to injection side for 20 minutes before and after injection. Also  rotate injection sites. Next on belly. Will call back if redness or pain  worsen or worsening reaction after next injection.                * * *                ---          * * *          PatientKallan [name], [name] DOB: 07/26/1960 Provider: [name] 04/18/2016    ---    Note generated by eClinicalWorks EMR/PM Software ([email or web address])

## 2016-04-21 ENCOUNTER — Ambulatory Visit: Admitting: Hand Surgery

## 2016-04-21 ENCOUNTER — Ambulatory Visit

## 2016-04-21 ENCOUNTER — Ambulatory Visit: Admit: 2016-04-21 | Payer: No Typology Code available for payment source

## 2016-04-21 NOTE — Progress Notes (Signed)
.  Progress Notes  .  Patient: [name]  Provider: Crist Infante  MD  .  DOB: [birth year] Age: 56 Y Sex: Female  Supervising Provider:: Yetta Numbers, MD  Date: 04/21/2016  .  PCP: [name]  MD  Date: 04/21/2016  .  --------------------------------------------------------------------------------  .  REASON FOR APPOINTMENT  .  1. POST OP LT RTSA 03/07/16  .  HISTORY OF PRESENT ILLNESS  .  GENERAL:   [name] is doing very well from her L RTSA. Very pleased with pain  relief and motion. No complaints. Attending PT with steady  progress.  .  CURRENT MEDICATIONS  .  Taking Baclofen 10 MG Tablet 1/2 tab Orally prn  Taking Calcium Carbonate-Vitamin D 600-200 MG-UNIT Capsule 1  capsule with a meal Orally Twice a day  Taking Enbrel SureClick 50 MG/ML Solution Auto-injector inject  50mg  (1 pen) sc Subcutaneous every week, Notes: not yet, still  pending  Taking GlipiZIDE XL 5 MG Tablet Extended Release 24 Hour 1 tablet  Orally Once a day  Taking Glucophage XR 500 mg Tablet Extended Release 24 Hour 4  tablets Orally Once a day  Taking Hydroxychloroquine Sulfate 200 mg Tablet 1 tablet with  food or milk Orally Once a day  Taking Levothyroxine Sodium 25 MCG Tablet 1 tablet Orally Once a  day  Taking Pantoprazole Sodium 40 mg Tablet Delayed Release take 1  tablet Orally Once a day  Taking PredniSONE 5 mg Tablet 2 tablet Orally Once a day  Taking Rosuvastatin Calcium 10 MG Tablet 1 tablet Orally Once a  day  Taking Tramadol HCl 50 MG Tablet 1 tablet Orally every 6 hrs  Not-Taking/PRN Voltaren 1 % Gel 2 grams Transdermal twice daily,  Notes: PRN  .  PAST MEDICAL HISTORY  .  Scleroderma - CREST dx 2007  IgG4 deficiency s/p IVIG 2010 ----single infusion given  preventively after week of bilateral knee replacements at [facility]  2010  Fx left wrist in 1994  Blood clot at LUE in 2006 on a short course of Coumadin  Bilateral carpal tunnel syndrome s/p Lt carpal tunnel release and  steroids injection right--  Bone spur left  foot  Scoliosis  Spinal stenosis s/p steroids injection  s/p bilateral knee replacements for valgus /arthritic  complications, performed by Dr. Katrinka Blazing 2010--- never infected, but  packed with antibiotics with surgery;  Right rotator cuff repairs x 4, complicated by repeat tears,  infection, placement of anchor material  Elevated liver function tests  Diabetes  .  ALLERGIES  .  N.K.D.A.  .  SURGICAL HISTORY  .  Right rotator cuff repair, with infected hardware that had to be  removed 2006  Repeat right shoulder surgery, also which became infected. 2007  Left rotator cuff repair 2008  bilateral knee replacements 2009  Left arthroscopic carpal tunnel release 01/2011  ORIF Left 4th metatarsal bone  Left Ulna shortening 1994  Knee replacement 2010  Hand surgery 2013, 2015  remove gallbladder/hernia 1990  Plate left foot 3rd metatarsal 2008  .  FAMILY HISTORY  .  Mother: deceased 76 yrs, lung cancer, hyperthyroidism, diagnosed  with Cancer  Father: deceased 44s yrs, heart attack, diagnosed with Heart  Disease  3 brother(s) .  FH of arthritis and sclerodermaFather deceased from MIMother  deceased age 33 with lung cancer Brother with scleroderma / copd.  .  SOCIAL HISTORY  .  .  Tobacco  history: Never smoked.  [name]  Work/Occupation: Administrator at fitness center.  [name]  [name]  Alcohol  Former daily EtOH use in 20s  .  Nonsmoker.Lives with longstanding boyfriend.  [name]  HOSPITALIZATION/MAJOR DIAGNOSTIC PROCEDURE  .  as above  .  REVIEW OF SYSTEMS  .  ORT:  .  Eyes    No . Ear, Nose Throat    No . Digestion, Stomach, Bowel     No . Bladder Problems    No . Bleeding Problems    No .  Numbness/Tingling    No . Anxiety/Depression    No .  Fever/Chills/Fatigue    No . Chest Pain/Tightness/Palpitations     No . Skin Rash    No . Dental Problems    No . Joint/Muscle  Pain/Cramps    Yes . Blackout/Fainting    No . Other    No .  .  VITAL SIGNS  .  Pain scale 3, Ht-in 62.  [name]  EXAMINATION  .  GENERAL: LUE: incision well healed, FF 150, AB  120,  ER 35, good strength, nl sensation ax/med/rad/ulnar nerves, palp  rad pulseXr: no fx, components in good alignment.  .  ASSESSMENTS  .  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  TREATMENT  .  Others  Notes: Ceilidh is recovering well. She will return to work 2/2.  Note given. She is to begin strengthening. She will avoid  resisted IR for another 6 weeks. She will return in 4.5 months  with repeat xr.  .  FOLLOW UP  .  18 weeks  .  [name]  Appointment Provider: Crist Infante  .  Electronically signed by Yetta Numbers , MD on  05/02/2016 at 09:46 PM EST  .  CONFIRMATORY SIGN OFF  .  [name]  Document electronically signed by Crist Infante  MD  .

## 2016-04-21 NOTE — Progress Notes (Signed)
* * *        [name]**    --- ---    56 Y old Female, DOB: 08-01-1960, External MRN: [id number]    Account Number: [id number]    [address] [name], [place]    Home: [phone]    Guarantor: [name] Insurance: NHP [place]    PCP: [name], MD Referring: Fortunato Curling, MD    [facility]        * * *    04/21/2016   **Appointment Provider:** Crist Infante **CHN#:** [id number]    --- ---      **Supervising Provider:** Yetta Numbers, MD    ---        Reason for Appointment    ---      1\. POST OP LT RTSA 03/07/16    ---      History of Present Illness    ---     _GENERAL_ :    [name] is doing very well from her L RTSA. Very pleased with pain relief and  motion. No complaints. Attending PT with steady progress.      Current Medications    ---    Taking     * Baclofen 10 MG Tablet 1/2 tab Orally prn    ---    * Calcium Carbonate-Vitamin D 600-200 MG-UNIT Capsule 1 capsule with a meal Orally Twice a day    ---    * Enbrel SureClick 50 MG/ML Solution Auto-injector inject 50mg  (1 pen) sc Subcutaneous every week, Notes: not yet, still pending    ---    * GlipiZIDE XL 5 MG Tablet Extended Release 24 Hour 1 tablet Orally Once a day    ---    * Glucophage XR 500 mg Tablet Extended Release 24 Hour 4 tablets Orally Once a day    ---    * Hydroxychloroquine Sulfate 200 mg Tablet 1 tablet with food or milk Orally Once a day    ---    * Levothyroxine Sodium 25 MCG Tablet 1 tablet Orally Once a day    ---    * Pantoprazole Sodium 40 mg Tablet Delayed Release take 1 tablet Orally Once a day    ---    * PredniSONE 5 mg Tablet 2 tablet Orally Once a day    ---    * Rosuvastatin Calcium 10 MG Tablet 1 tablet Orally Once a day    ---    * Tramadol HCl 50 MG Tablet 1 tablet Orally every 6 hrs    ---    Not-Taking/PRN    * Voltaren 1 % Gel 2 grams Transdermal twice daily, Notes: PRN    ---      Past Medical History    ---       Scleroderma - CREST dx 2007.        ---     IgG4 deficiency s/p IVIG 2010 ----single infusion given preventively after  week of bilateral knee replacements at [facility] 2010.        ---    Fx left wrist in 1994.        ---    Blood clot at LUE in 2006 on a short course of Coumadin.        ---    Bilateral carpal tunnel syndrome s/p Lt carpal tunnel release and steroids  injection right--.        ---    Bone spur left foot.        ---  Scoliosis.        ---    Spinal stenosis s/p steroids injection.        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 Katrinka Blazing 2010--- never infected, but packed with antibiotics with  surgery;.        ---    Right rotator cuff repairs x 4, complicated by repeat tears, infection,  placement of anchor material.        ---    Elevated liver function tests.        ---    Diabetes.        ---      Surgical History    ---      Right rotator cuff repair, with infected hardware that had to be removed  2006    ---    Repeat right shoulder surgery, also which became infected. 2007    ---    Left rotator cuff repair 2008    ---    bilateral knee replacements 2009    ---    Left arthroscopic carpal tunnel release 01/2011    ---    ORIF Left 4th metatarsal bone    ---    Left Ulna shortening 1994    ---    Knee replacement 2010    ---    Hand surgery 2013, 2015    ---    remove gallbladder/hernia 1990    ---    Plate left foot 3rd metatarsal 2008    ---      Family History    ---      Mother: deceased 29 yrs, lung cancer, hyperthyroidism, diagnosed with Cancer    ---    Father: deceased 26s yrs, heart attack, diagnosed with Heart Disease    ---    3 brother(s) .    ---    FH of arthritis and scleroderma    Father deceased from MI    Mother deceased age 18 with lung cancer    Brother with scleroderma / copd.    ---      Social History    ---    Tobacco history: Never smoked.    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    Alcohol    _Former daily EtOH use in 20s_   Nonsmoker.    Lives with  longstanding boyfriend.    ---      Allergies    ---      N.K.D.A.    ---      Hospitalization/Major Diagnostic Procedure    ---      as above    ---      Review of Systems    ---     _ORT_ :    Eyes No. Ear, Nose Throat No. Digestion, Stomach, Bowel No. Bladder Problems  No. Bleeding Problems No. Numbness/Tingling No. Anxiety/Depression No.  Fever/Chills/Fatigue No. Chest Pain/Tightness/Palpitations No. Skin Rash No.  Dental Problems No. Joint/Muscle Pain/Cramps Yes. Blackout/Fainting No. Other  No.          Vital Signs    ---    Pain scale 3, Ht-in 62.      Examination    ---     _GENERAL_ :    LUE: incision well healed, FF 150, AB 120, ER 35, good strength, nl sensation  ax/med/rad/ulnar nerves, palp rad pulse    Xr: no fx, components in good alignment.  Assessments    ---    1\. 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 Others**    Notes: [name] is recovering well. She will return to work 2/2. Note given. She  is to begin strengthening. She will avoid resisted IR for another 6 weeks. She  will return in 4.5 months with repeat xr.    ---      Follow Up    ---    18 weeks    **Appointment Provider:** [facility]    Electronically signed by Yetta Numbers , MD on 05/02/2016 at 09:46 PM EST    Sign off status: Completed        * * *        [facility]    [address]    [place], [place] [address]    Tel: [phone]    Fax: [phone]              * * *          Patient: [name], [name] DOB: 31-May-1960 Progress Note: Donavin Audino  04/21/2016    ---    Note generated by eClinicalWorks EMR/PM Software ([email or web address])

## 2016-04-25 ENCOUNTER — Ambulatory Visit: Admitting: Hand Surgery

## 2016-04-25 NOTE — Progress Notes (Signed)
* * *        **  [name]**    --- ---    [name], DOB: 30-May-1960    [address] [name], [place] [address]    Home: [phone]    Provider: Yetta Numbers        * * *    Telephone Encounter    ---    Answered by   Matthias Hughs  Date: 04/25/2016         Time: 08:59 AM    Caller   Patient    --- ---            Reason   Schedule Urgent Appointment            Message                      Good morning [name],      Patient believes she has re-injured the left shoulder yesterday while going to the rest room. She states she is in pain and has limited motion. Patient would like to schedule an appointment with Dr. [name] as soon as possible. Patient has had surgery 03/07/16 and Post Op 04/21/16. Please contact patient at phone number [phone]. Thank you.                Action Taken   [facility] 04/25/2016 9:07:19 AM > Persico,Claudio 04/25/2016  9:33:54 AM > Spoke to Guin. I scheduled her to come in tomorrow morning to be  seen by Dr. Abbe Amsterdam.                * * *                ---          * * *          Patient: Greenstein, Liv DOB: 05-Mar-1961 Provider: Yetta Numbers 04/25/2016    ---    Note generated by eClinicalWorks EMR/PM Software ([email or web address])

## 2016-04-26 ENCOUNTER — Ambulatory Visit: Admitting: Hand Surgery

## 2016-04-26 ENCOUNTER — Ambulatory Visit

## 2016-04-26 ENCOUNTER — Ambulatory Visit: Admit: 2016-04-26 | Payer: No Typology Code available for payment source

## 2016-04-26 NOTE — Progress Notes (Signed)
* * *        [name]**    --- ---    56 Y old Female, DOB: 10-12-1960, External MRN: [id number]    Account Number: [id number]    [address] [name], [place]    Home: [phone]    Guarantor: [name] Insurance: [place]P [place]    PCP: [name], MD Referring: Fortunato Curling, MD    Appointment Facility: [facility]        * * *    04/26/2016   **Appointment Provider:** Crist Infante **CHN#:** [id number]    --- ---      **Supervising Provider:** Yetta Numbers, MD    ---        Reason for Appointment    ---      1\. POST OP LT RTSA 03/07/16    ---      History of Present Illness    ---     _GENERAL_ [name] was recently seen by Dr. [name] on 1/22 and was doing great at that  time. She states that on the evening on 1/22 she was putting her pants arm  with the arm at the side when she had immediate onset shoulder pain. She  points over her post shoulder. Performed PT yesterday but had limitations due  to pain. Denies any subluxation/dislocation of the shoulder.      Current Medications    ---    Taking     * Baclofen 10 MG Tablet 1/2 tab Orally prn    ---    * Calcium Carbonate-Vitamin D 600-200 MG-UNIT Capsule 1 capsule with a meal Orally Twice a day    ---    * Enbrel SureClick 50 MG/ML Solution Auto-injector inject 50mg  (1 pen) sc Subcutaneous every week, Notes: not yet, still pending    ---    * GlipiZIDE XL 5 MG Tablet Extended Release 24 Hour 1 tablet Orally Once a day    ---    * Glucophage XR 500 mg Tablet Extended Release 24 Hour 4 tablets Orally Once a day    ---    * Hydroxychloroquine Sulfate 200 mg Tablet 1 tablet with food or milk Orally Once a day    ---    * Levothyroxine Sodium 25 MCG Tablet 1 tablet Orally Once a day    ---    * Pantoprazole Sodium 40 mg Tablet Delayed Release take 1 tablet Orally Once a day    ---    * PredniSONE 5 mg Tablet 2 tablet Orally Once a day    ---    * Rosuvastatin Calcium 10 MG Tablet 1 tablet Orally Once a day    ---    *  Tramadol HCl 50 MG Tablet 1 tablet Orally every 6 hrs    ---    Not-Taking/PRN    * Voltaren 1 % Gel 2 grams Transdermal twice daily, Notes: PRN    ---      Past Medical History    ---       Scleroderma - CREST dx 2007.        ---    IgG4 deficiency s/p IVIG 2010 ----single infusion given preventively after  week of bilateral knee replacements at [facility] 2010.        ---    Fx left wrist in 1994.        ---    Blood clot at LUE in 2006 on a  short course of Coumadin.        ---    Bilateral carpal tunnel syndrome s/p Lt carpal tunnel release and steroids  injection right--.        ---    Bone spur left foot.        ---    Scoliosis.        ---    Spinal stenosis s/p steroids injection.        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 Katrinka Blazing 2010--- never infected, but packed with antibiotics with  surgery;.        ---    Right rotator cuff repairs x 4, complicated by repeat tears, infection,  placement of anchor material.        ---    Elevated liver function tests.        ---    Diabetes.        ---      Surgical History    ---      Right rotator cuff repair, with infected hardware that had to be removed  2006    ---    Repeat right shoulder surgery, also which became infected. 2007    ---    Left rotator cuff repair 2008    ---    bilateral knee replacements 2009    ---    Left arthroscopic carpal tunnel release 01/2011    ---    ORIF Left 4th metatarsal bone    ---    Left Ulna shortening 1994    ---    Knee replacement 2010    ---    Hand surgery 2013, 2015    ---    remove gallbladder/hernia 1990    ---    Plate left foot 3rd metatarsal 2008    ---      Family History    ---      Mother: deceased 79 yrs, lung cancer, hyperthyroidism, diagnosed with Cancer    ---    Father: deceased 27s yrs, heart attack, diagnosed with Heart Disease    ---    3 brother(s) .    ---    FH of arthritis and scleroderma    Father deceased from MI    Mother deceased age 36 with lung  cancer    Brother with scleroderma / copd.    ---      Social History    ---    Tobacco history: Never smoked.    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    Alcohol    _Former daily EtOH use in 20s_   Nonsmoker.    Lives with longstanding boyfriend.    ---      Allergies    ---      N.K.D.A.    ---      Hospitalization/Major Diagnostic Procedure    ---      as above    ---      Review of Systems    ---     _ORT_ :    Eyes No. Ear, Nose Throat No. Digestion, Stomach, Bowel No. Bladder Problems  No. Bleeding Problems No. Numbness/Tingling No. Anxiety/Depression No.  Fever/Chills/Fatigue No. Chest Pain/Tightness/Palpitations No. Skin Rash No.  Dental Problems No. Joint/Muscle Pain/Cramps Yes. Blackout/Fainting No. Other  No.          Examination    ---     _GENERAL_ :    LUE: no  eccymosis abt shoulder, no TTP over subscap, full PROM, FF 140 with  pain, AB 130, ER 35 with arm at the side, pain on resisted FF/AB    Xray shoulder: components in good position, no change from prior xr on 1/22.  No fx/dx.          Assessments    ---    1\. 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 Post-op pain - G89.18    ---      Treatment    ---       **1\. Others**    Notes: Codee maintains good AROM of the shoulder. She will take tylenol/nsaids  for pain and continue to work with PT as pain allows. I expect her pain to  continue to improve over the next few weeks. She will fu in 3 weeks if still  having issues.    ---      Follow Up    ---    3 Weeks    **Appointment Provider:** Crist Infante    Electronically signed by Yetta Numbers , MD on 05/02/2016 at 09:46 PM EST    Sign off status: Completed        * * *        [facility]    [address]    [place], [place] [address]    Tel: [phone]    Fax: [phone]              * * *          Patient: [name], [name] DOB: March 13, 1961 Progress Note: [name]  04/26/2016    ---    Note generated by eClinicalWorks  EMR/PM Software ([email or web address])

## 2016-04-26 NOTE — Progress Notes (Signed)
.  Progress Notes  .  Patient: [name]  Provider: Crist Infante  MD  .  DOB: 30-Aug-1960 Age: 56 Y Sex: Female  Supervising Provider:: [name] Numbers, MD  Date: 04/26/2016  .  PCP: [name] Forts  MD  Date: 04/26/2016  .  --------------------------------------------------------------------------------  .  REASON FOR APPOINTMENT  .  1. POST OP LT RTSA 03/07/16  .  HISTORY OF PRESENT ILLNESS  .  GENERAL:   [name] was recently seen by Dr. [name] on 1/22 and was doing  great at that time. She states that on the evening on 1/22 she  was putting her pants arm with the arm at the side when she had  immediate onset shoulder pain. She points over her post shoulder.  Performed PT yesterday but had limitations due to pain. Denies  any subluxation/dislocation of the shoulder.  .  CURRENT MEDICATIONS  .  Taking Baclofen 10 MG Tablet 1/2 tab Orally prn  Taking Calcium Carbonate-Vitamin D 600-200 MG-UNIT Capsule 1  capsule with a meal Orally Twice a day  Taking Enbrel SureClick 50 MG/ML Solution Auto-injector inject  50mg  (1 pen) sc Subcutaneous every week, Notes: not yet, still  pending  Taking GlipiZIDE XL 5 MG Tablet Extended Release 24 Hour 1 tablet  Orally Once a day  Taking Glucophage XR 500 mg Tablet Extended Release 24 Hour 4  tablets Orally Once a day  Taking Hydroxychloroquine Sulfate 200 mg Tablet 1 tablet with  food or milk Orally Once a day  Taking Levothyroxine Sodium 25 MCG Tablet 1 tablet Orally Once a  day  Taking Pantoprazole Sodium 40 mg Tablet Delayed Release take 1  tablet Orally Once a day  Taking PredniSONE 5 mg Tablet 2 tablet Orally Once a day  Taking Rosuvastatin Calcium 10 MG Tablet 1 tablet Orally Once a  day  Taking Tramadol HCl 50 MG Tablet 1 tablet Orally every 6 hrs  Not-Taking/PRN Voltaren 1 % Gel 2 grams Transdermal twice daily,  Notes: PRN  .  PAST MEDICAL HISTORY  .  Scleroderma - CREST dx 2007  IgG4 deficiency s/p IVIG 2010 ----single infusion given  preventively after week of  bilateral knee replacements at [facility]  2010  Fx left wrist in 1994  Blood clot at LUE in 2006 on a short course of Coumadin  Bilateral carpal tunnel syndrome s/p Lt carpal tunnel release and  steroids injection right--  Bone spur left foot  Scoliosis  Spinal stenosis s/p steroids injection  s/p bilateral knee replacements for valgus /arthritic  complications, performed by Dr. Katrinka Blazing 2010--- never infected, but  packed with antibiotics with surgery;  Right rotator cuff repairs x 4, complicated by repeat tears,  infection, placement of anchor material  Elevated liver function tests  Diabetes  .  ALLERGIES  .  N.K.D.A.  .  SURGICAL HISTORY  .  Right rotator cuff repair, with infected hardware that had to be  removed 2006  Repeat right shoulder surgery, also which became infected. 2007  Left rotator cuff repair 2008  bilateral knee replacements 2009  Left arthroscopic carpal tunnel release 01/2011  ORIF Left 4th metatarsal bone  Left Ulna shortening 1994  Knee replacement 2010  Hand surgery 2013, 2015  remove gallbladder/hernia 1990  Plate left foot 3rd metatarsal 2008  .  FAMILY HISTORY  .  Mother: deceased 55 yrs, lung cancer, hyperthyroidism, diagnosed  with Cancer  Father: deceased 65s yrs, heart attack, diagnosed with Heart  Disease  3 brother(s) .  FH  of arthritis and sclerodermaFather deceased from MIMother  deceased age 55 with lung cancer Brother with scleroderma / copd.  .  SOCIAL HISTORY  .  .  Tobacco  history: Never smoked.  [name]  Work/Occupation: Administrator at fitness center.  .  .  Alcohol  Former daily EtOH use in 20s  .  Nonsmoker.Lives with longstanding boyfriend.  [name]  HOSPITALIZATION/MAJOR DIAGNOSTIC PROCEDURE  .  as above  .  REVIEW OF SYSTEMS  .  ORT:  .  Eyes    No . Ear, Nose Throat    No . Digestion, Stomach, Bowel     No . Bladder Problems    No . Bleeding Problems    No .  Numbness/Tingling    No . Anxiety/Depression    No .  Fever/Chills/Fatigue    No . Chest Pain/Tightness/Palpitations     No .  Skin Rash    No . Dental Problems    No . Joint/Muscle  Pain/Cramps    Yes . Blackout/Fainting    No . Other    No .  .  EXAMINATION  .  GENERAL: LUE: no eccymosis abt shoulder, no TTP  over subscap, full PROM, FF 140 with pain, AB 130, ER 35 with arm  at the side, pain on resisted FF/ABXray shoulder: components in  good position, no change from prior xr on 1/22. No fx/dx.  .  ASSESSMENTS  .  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  Post-op pain - G89.18  .  TREATMENT  .  Others  Notes: Mikiya maintains good AROM of the shoulder. She will take  tylenol/nsaids for pain and continue to work with PT as pain  allows. I expect her pain to continue to improve over the next  few weeks. She will fu in 3 weeks if still having issues.  .  FOLLOW UP  .  3 Weeks  .  [name]  Appointment Provider: Crist [name]  .  Electronically signed by [name] , MD on  05/02/2016 at 09:46 PM EST  .  CONFIRMATORY SIGN OFF  .  [name]  Document electronically signed by [name] Infante  MD  .

## 2016-05-02 ENCOUNTER — Ambulatory Visit: Admitting: Hand Surgery

## 2016-05-02 NOTE — Progress Notes (Signed)
* * *        **  [name]**    --- ---    13 Y old Female, DOB: 02-Mar-1961    [address] [name], [place] [address]    Home: [phone]    Provider: Yetta Numbers        * * *    Telephone Encounter    ---    Answered by   Rufina Falco  Date: 05/02/2016         Time: 01:50 PM    Caller   patient    --- ---            Reason   call back to confirm letter status            Message                      Hi       Patient would like a call back to confirm if her work status letter was sent to her job. Please call patient at [phone]. Thank you                 Action Taken   Muscadin,Rymonnce 05/02/2016 1:52:19 PM > Persico,Claudio  05/03/2016 10:01:28 AM > , Action - Pt telephoned. Spoke to pt.                * * *                ---          * * *          Patient: [name], [name] DOB: 09-01-1960 Provider: Yetta Numbers 05/02/2016    ---    Note generated by eClinicalWorks EMR/PM Software ([email or web address])

## 2016-05-03 ENCOUNTER — Ambulatory Visit: Admitting: Hand Surgery

## 2016-05-03 NOTE — Progress Notes (Signed)
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 Patient will like to change from 05-08-16 to 05-11-16. Patient can be reached at [phone] or [phone].                Action Taken   Dorrelus,Fedner 05/03/2016 10:34:21 AM > Persico,Claudio  05/03/2016 11:05:34 AM > , Action - Pt telephoned. Spoke to pt. We will fax a  work note stating the return to work on 05/12/15                * * *                ---          * * *          Patient: [name] DOB: 1961/04/01 Provider: Yetta Numbers 05/03/2016    ---    Note generated by eClinicalWorks EMR/PM Software ([email or web address])

## 2016-05-05 ENCOUNTER — Ambulatory Visit

## 2016-05-05 ENCOUNTER — Ambulatory Visit: Admitting: Rheumatology

## 2016-05-05 ENCOUNTER — Ambulatory Visit: Admit: 2016-05-05 | Payer: No Typology Code available for payment source

## 2016-05-05 LAB — HX CHEM-PANELS
HX BLOOD UREA NITROGEN: 15 mg/dL (ref 6–24)
HX CREATININE (CR): 0.86 mg/dL (ref 0.57–1.30)
HX GFR, AFRICAN AMERICAN: 89 mL/min/{1.73_m2} — ABNORMAL LOW
HX GFR, NON-AFRICAN AMERICAN: 77 mL/min/{1.73_m2} — ABNORMAL LOW

## 2016-05-05 LAB — HX HEM-ROUTINE
HX BASO #: 0 10*3/uL (ref 0.0–0.2)
HX BASO: 1 %
HX EOSIN #: 0.2 10*3/uL (ref 0.0–0.5)
HX EOSIN: 2 %
HX HCT: 41.6 % (ref 32.0–45.0)
HX HGB: 13.9 g/dL (ref 11.0–15.0)
HX IMMATURE GRANULOCYTE#: 0 10*3/uL (ref 0.0–0.1)
HX IMMATURE GRANULOCYTE: 0 %
HX LYMPH #: 2 10*3/uL (ref 1.0–4.0)
HX LYMPH: 25 %
HX MCH: 30.3 pg (ref 26.0–34.0)
HX MCHC: 33.4 g/dL (ref 32.0–36.0)
HX MCV: 90.8 fL (ref 80.0–98.0)
HX MONO #: 0.8 10*3/uL (ref 0.2–0.8)
HX MONO: 10 %
HX MPV: 10.5 fL (ref 9.1–11.7)
HX NEUT #: 5 10*3/uL (ref 1.5–7.5)
HX PLT: 242 10*3/uL (ref 150–400)
HX RBC BLOOD COUNT: 4.58 M/uL (ref 3.70–5.00)
HX RDW: 13.3 % (ref 11.5–14.5)
HX SEG NEUT: 63 %
HX WBC: 8 10*3/uL (ref 4.0–11.0)

## 2016-05-05 LAB — HX CHEM-LFT
HX ALANINE AMINOTRANSFERASE (ALT/SGPT): 135 IU/L — ABNORMAL HIGH (ref 0–54)
HX ALKALINE PHOSPHATASE (ALK): 358 IU/L — ABNORMAL HIGH (ref 40–130)
HX ASPARTATE AMINOTRANFERASE (AST/SGOT): 56 IU/L — ABNORMAL HIGH (ref 10–42)
HX BILIRUBIN, TOTAL: 0.8 mg/dL (ref 0.2–1.1)

## 2016-05-05 LAB — HX HEM-MISC: HX SED RATE: 43 mm — ABNORMAL HIGH (ref 0–30)

## 2016-05-05 LAB — HX IMMUNOLOGY: HX C-REACTIVE PROTEIN - CRP: 4.37 mg/L (ref 0.00–7.48)

## 2016-05-05 MED ORDER — Ranitidine HCl: 150 | Capsule | Freq: Every day | 3 refills | 0 days | Status: AC

## 2016-05-05 MED ORDER — Omeprazole: 40 | Capsule | Freq: Every day | 3 refills | 0 days | Status: AC

## 2016-05-05 NOTE — Progress Notes (Signed)
* * *        [name]**    --- ---    56 Y old Female, DOB: 1961-01-08, External MRN: [id number]    Account Number: [id number]    [address] [id number]    Home: [phone]    Guarantor: [name] Insurance: [employer] [place]    PCP: [name] Forts, MD Referring: [name] Curling, MD    Appointment Facility: Rheumatology        * * *    05/05/2016  Progress Notes: [name] Pimple, MD **CHN#:** [phone]    --- ---    ---        Reason for Appointment    ---      1\. Follow up scleroderma    ---      History of Present Illness    ---     _GENERAL_ :    [name] presents for a follow up of her scleroderma. She is doing about the  same as before. She has been taking Enbrel for 5 weeks with not much relief;  the first two week of taking medication she had no reaction but the third week  she developed a red blotch on her upper thigh. She continues to have joint  pain in her hands exacerbated by cold weather, accompanied by mild GI  problems, she bough zantac over the counter that provides relief. She also has  minor cut on left hand that she has been soaking in hydrogen peroxide.    Denies fevers, chills, no new infections.      Current Medications    ---    Taking     * Baclofen 10 MG Tablet 1/2 tab Orally prn    ---    * Calcium Carbonate-Vitamin D 600-200 MG-UNIT Capsule 1 capsule with a meal Orally Twice a day    ---    * Enbrel SureClick 50 MG/ML Solution Auto-injector inject 50mg  (1 pen) sc Subcutaneous every week, Notes: not yet, still pending    ---    * GlipiZIDE XL 5 MG Tablet Extended Release 24 Hour 1 tablet Orally Once a day    ---    * Glucophage XR 500 mg Tablet Extended Release 24 Hour 4 tablets Orally Once a day    ---    * Hydroxychloroquine Sulfate 200 mg Tablet 1 tablet with food or milk Orally Once a day    ---    * Levothyroxine Sodium 25 MCG Tablet 1 tablet Orally Once a day    ---    * Pantoprazole Sodium 40 mg Tablet Delayed Release take 1 tablet Orally Once a day    ---    *  PredniSONE 5 mg Tablet 2 tablet Orally Once a day    ---    * Rosuvastatin Calcium 10 MG Tablet 1 tablet Orally Once a day    ---    * Tramadol HCl 50 MG Tablet 1 tablet Orally every 6 hrs    ---    Not-Taking/PRN    * Voltaren 1 % Gel 2 grams Transdermal twice daily, Notes: PRN    ---    * Medication List reviewed and reconciled with the patient    ---      Past Medical History    ---       Scleroderma - CREST dx 2007.        ---    IgG4 deficiency s/p IVIG 2010 ----single infusion given preventively  after  week of bilateral knee replacements at [facility] 2010.        ---    Fx left wrist in 1994.        ---    Blood clot at LUE in 2006 on a short course of Coumadin.        ---    Bilateral carpal tunnel syndrome s/p Lt carpal tunnel release and steroids  injection right--.        ---    Bone spur left foot.        ---    Scoliosis.        ---    Spinal stenosis s/p steroids injection.        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 Katrinka Blazing 2010--- never infected, but packed with antibiotics with  surgery;.        ---    Right rotator cuff repairs x 4, complicated by repeat tears, infection,  placement of anchor material.        ---    Elevated liver function tests.        ---    Diabetes.        ---      Surgical History    ---      Right rotator cuff repair, with infected hardware that had to be removed  2006    ---    Repeat right shoulder surgery, also which became infected. 2007    ---    Left rotator cuff repair 2008    ---    bilateral knee replacements 2009    ---    Left arthroscopic carpal tunnel release 01/2011    ---    ORIF Left 4th metatarsal bone    ---    Left Ulna shortening 1994    ---    Knee replacement 2010    ---    Hand surgery 2013, 2015    ---    remove gallbladder/hernia 1990    ---    Plate left foot 3rd metatarsal 2008    ---      Family History    ---      Mother: deceased 52 yrs, lung cancer, hyperthyroidism, diagnosed with Cancer    ---     Father: deceased 29s yrs, heart attack, diagnosed with Heart Disease    ---    3 brother(s) .    ---    Non-Contributory    ---    FH of arthritis and scleroderma    Father deceased from MI    Mother deceased age 48 with lung cancer    Brother with scleroderma / copd.    ---      Social History    ---    Tobacco history: Never smoked.    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    Alcohol  Former daily EtOH use in 20s.   Nonsmoker.    Lives with longstanding boyfriend.    ---      Allergies    ---      N.K.D.A.    ---      Hospitalization/Major Diagnostic Procedure    ---      as above    ---      Review of Systems    ---     _ADULT Rheumatology ROS_ :    Constitutional No Recent weight gain, Fatigue, Chills. Eyes No Pain, Loss of  vision,  Itching eyes. HENT No Headache Ringing in ears Loss of hearing.  Respiratory No Shortness of breath, Cough, Hemoptysis. Gastrointestinal No  Nausea Heartburn Diarrhea. Genitourinary No Difficult urination, Pus in urine,  Blood in urine. Musculoskeletal **+Joint pain/ Swelling** . Integumentary  (skin and/or breast) No Easy bruising, Rash, Sun sensitive (sun allergy).  Neurological System No, Headaches, Dizziness, Fainting. Psychiatric No  Excessive worries, Depression, Night sweats. Endocrine No Excessive thirst.  Hematologic/Lymphatic No Swollen glands, Anemia, Lymphadenopathy.  Allergic/Immunologic No Frequent sneezing, Increased susceptibility to  infection, Raynauds. Heart No Chest Pain, Heart murmur, Irregular heart beat.          Vital Signs    ---    Pain scale 3, Ht-in 62, Wt-lbs 207, BMI 37.86, BP 127/87, HR 97.      Examination    ---     _Rheumatology_ :    General Appearance Alert and oriented , No apparent distress .    HENT: No, alopecia .    Eyes: No, scleral icterus, scleral erythema .    CV: regular rate rhythm .    Ext: trace LE edema .    Skin: No, rash .    Psych: No anxiety depression.    Muskuloskeletal  Elbows, shoulders, hips, knees have intact  ROM with no  synovitis. .    Skin and Nails Minimal sclerodactyly.    _RAPID 3_ :    Function (0-10): 1.0.        Pain (0-10): 7.5.        Patient Global (0-10): 2.0.          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 Elevated liver enzymes - R74.8    ---    4\. Scleroderma - M34.9    ---    5\. Long-term use of high-risk medication - Z79.899    ---      She still has severe synovitis. 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 There is also significant risk of infection due  to her IgG4 deficiency. She has been on enbrel for 5 weeks now with no  appreciable benefit. We discussed the possibility of changing her medication  balanced with her infection risk and liver disease. We settle on another 2-3  weeks of enbrel and if that does not work, Humira may be an option. I will  plan to follow-up with her by phone at that time to solidify the treatment  plan. She is due for monitoring labs today. I will see her back in 3 months  for an in-office visit.    ---      Treatment    ---       **1\. Seronegative rheumatoid arthritis**    Stop Pantoprazole Sodium Tablet Delayed Release, 40 mg, take 1 tablet, Orally,  Once a day    Start Omeprazole Capsule Delayed Release, 40 mg, 1 capsule, Orally, Once a  day, 90 days, 90 Capsule, Refills 3    Start Ranitidine HCl Capsule, 150 mg, 1 capsule at bedtime, Orally, Once a  day, 90 days, 90 Capsule, Refills 3    _LAB: Alkaline Phosphatase (ALK)_   Alkaline Phosphatase (ALK)  358  H  40 -  130 - IU/L    --- --- --- ---    _LAB: Bilirubin, Total_ Bilirubin, Total  0.8    0.2 - 1.1 - mg/dL    --- --- --- ---    _LAB: Aspartate Aminotranferase (AST/SGOT)_ Aspartate Aminotranferase  (AST/SGOT)  56  H  10 - 42 - IU/L    --- --- --- ---    _LAB: Alanine Aminotransferase (ALT/SGPT)_ Alanine Aminotransferase  (ALT/SGPT)  135  H  0 - 54 - IU/L    --- --- --- ---    _LAB: Blood  Urea Nitrogen (BUN)_ Blood Urea Nitrogen  15    6 - 24 - mg/dL    --- --- --- ---    _LAB: Sedimentation Rate (ESR)_ Sed Rate  43  H  0 - 30 - mm    --- --- --- ---    _LAB: C-Reactive Protein - CRP_ C-Reactive Protein - CRP (Ultra-Wide Range)   4.37    0.00 - 7.48 - mg/L    --- --- --- ---    _LAB: CBC_DIFF_ WBC  8.0    4.0 - 11.0 - K/uL    --- --- --- ---    RBC  4.58    3.70 - 5.00 - M/uL    --- --- --- ---    HGB  13.9    11.0 - 15.0 - g/dL    --- --- --- ---    HCT  41.6    32.0 - 45.0 - %    --- --- --- ---    MCV  90.8    80.0 - 98.0 - fL    --- --- --- ---    MCH  30.3    26.0 - 34.0 - pg    --- --- --- ---    MCHC  33.4    32.0 - 36.0 - g/dL    --- --- --- ---    RDW  13.3    11.5 - 14.5 - %    --- --- --- ---    PLT  242    150 - 400 - K/uL    --- --- --- ---    MPV  10.5    9.1 - 11.7 - fL    --- --- --- ---    SEG NEUT  63     \- %    --- --- --- ---    LYMPH  25     \- %    --- --- --- ---    MONO  10     \- %    --- --- --- ---    EOS  2     \- %    --- --- --- ---    BASO  1     \- %    --- --- --- ---    NEUT #  5.0    1.5 - 7.5 - K/uL    --- --- --- ---    LYMPH #  2.0    1.0 - 4.0 - K/uL    --- --- --- ---    MONO #  0.8    0.2 - 0.8 - K/uL    --- --- --- ---    EOSIN #  0.2    0.0 - 0.5 - K/uL    --- --- --- ---    BASO #  0.0    0.0 - 0.2 - K/uL    --- --- --- ---    Imm Grnas  0     \- %    --- --- --- ---  Imm Grans, Abs  0.0    0.0 - 0.1 - K/uL    --- --- --- ---    _LAB: Creatinine (CR)_ Creatinine (CR)  0.86    0.57 - 1.30 - mg/dL    --- --- --- ---      Follow Up    ---    3 Months    Electronically signed by Erasmo Leventhal , MD on 05/14/2016 at 10:33 AM EST    Sign off status: Completed        * * *        Rheumatology    [address]    [place], [place] [address]    Tel: [phone]    Fax: [phone]              * * *          Patient: [name], [name] DOB: [birth year] Progress Note: [name] Pimple, MD  05/05/2016    ---    Note generated by eClinicalWorks EMR/PM Software  ([email or web address])

## 2016-05-05 NOTE — Progress Notes (Signed)
.  Progress Notes  .  Patient: [name], [name]  Provider: [name]  DOB: [birth year] Age: 56 Y Sex: Female  .  PCP: [name]  MD  Date: 05/05/2016  .  --------------------------------------------------------------------------------  .  REASON FOR APPOINTMENT  .  1. Follow up scleroderma  .  HISTORY OF PRESENT ILLNESS  .  GENERAL:   Mrs Ojo presents for a follow up of her scleroderma. She is  doing about the same as before. She has been taking Enbrel for 5  weeks with not much relief; the first two week of taking  medication she had no reaction but the third week she developed a  red blotch on her upper thigh. She continues to have joint pain  in her hands exacerbated by cold weather, accompanied by mild GI  problems, she bough zantac over the counter that provides relief.  She also has minor cut on left hand that she has been soaking in  hydrogen peroxide.Denies fevers, chills, no new infections.  .  CURRENT MEDICATIONS  .  Taking Baclofen 10 MG Tablet 1/2 tab Orally prn  Taking Calcium Carbonate-Vitamin D 600-200 MG-UNIT Capsule 1  capsule with a meal Orally Twice a day  Taking Enbrel SureClick 50 MG/ML Solution Auto-injector inject  50mg  (1 pen) sc Subcutaneous every week, Notes: not yet, still  pending  Taking GlipiZIDE XL 5 MG Tablet Extended Release 24 Hour 1 tablet  Orally Once a day  Taking Glucophage XR 500 mg Tablet Extended Release 24 Hour 4  tablets Orally Once a day  Taking Hydroxychloroquine Sulfate 200 mg Tablet 1 tablet with  food or milk Orally Once a day  Taking Levothyroxine Sodium 25 MCG Tablet 1 tablet Orally Once a  day  Taking Pantoprazole Sodium 40 mg Tablet Delayed Release take 1  tablet Orally Once a day  Taking PredniSONE 5 mg Tablet 2 tablet Orally Once a day  Taking Rosuvastatin Calcium 10 MG Tablet 1 tablet Orally Once a  day  Taking Tramadol HCl 50 MG Tablet 1 tablet Orally every 6 hrs  Not-Taking/PRN Voltaren 1 % Gel 2 grams Transdermal twice daily,  Notes:  PRN  Medication List reviewed and reconciled with the patient  .  PAST MEDICAL HISTORY  .  Scleroderma - CREST dx 2007  IgG4 deficiency s/p IVIG 2010 ----single infusion given  preventively after week of bilateral knee replacements at [facility]  2010  Fx left wrist in 1994  Blood clot at LUE in 2006 on a short course of Coumadin  Bilateral carpal tunnel syndrome s/p Lt carpal tunnel release and  steroids injection right--  Bone spur left foot  Scoliosis  Spinal stenosis s/p steroids injection  s/p bilateral knee replacements for valgus /arthritic  complications, performed by Dr. Katrinka Blazing 2010--- never infected, but  packed with antibiotics with surgery;  Right rotator cuff repairs x 4, complicated by repeat tears,  infection, placement of anchor material  Elevated liver function tests  Diabetes  .  ALLERGIES  .  N.K.D.A.  .  SURGICAL HISTORY  .  Right rotator cuff repair, with infected hardware that had to be  removed 2006  Repeat right shoulder surgery, also which became infected. 2007  Left rotator cuff repair 2008  bilateral knee replacements 2009  Left arthroscopic carpal tunnel release 01/2011  ORIF Left 4th metatarsal bone  Left Ulna shortening 1994  Knee replacement 2010  Hand surgery 2013, 2015  remove gallbladder/hernia 1990  Plate left foot 3rd  metatarsal 2008  .  FAMILY HISTORY  .  Mother: deceased 59 yrs, lung cancer, hyperthyroidism, diagnosed  with Cancer  Father: deceased 64s yrs, heart attack, diagnosed with Heart  Disease  3 brother(s) .  Non-Contributory  FH of arthritis and sclerodermaFather deceased from MIMother  deceased age 60 with lung cancer Brother with scleroderma / copd.  .  SOCIAL HISTORY  .  .  Tobacco  history: Never smoked.  .  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  .  Alcohol Former daily EtOH use in 20s.  .  Nonsmoker.Lives with longstanding boyfriend.  [name]  HOSPITALIZATION/MAJOR DIAGNOSTIC PROCEDURE  .  as above  .  REVIEW OF SYSTEMS  .  ADULT Rheumatology ROS :  .  Constitutional     No Recent weight gain, Fatigue, Chills . Eyes     No Pain, Loss of vision, Itching eyes . HENT    No Headache  Ringing in ears Loss of hearing . Respiratory    No Shortness of  breath, Cough, Hemoptysis . Gastrointestinal    No Nausea  Heartburn Diarrhea . Genitourinary    No Difficult urination, Pus  in urine, Blood in urine . Musculoskeletal    +Joint pain/  Swelling  . Integumentary (skin and/or breast)    No Easy  bruising, Rash, Sun sensitive (sun allergy) . Neurological System     No, Headaches, Dizziness, Fainting . Psychiatric    No  Excessive worries, Depression, Night sweats . Endocrine    No  Excessive thirst . Hematologic/Lymphatic    No Swollen glands,  Anemia, Lymphadenopathy . Allergic/Immunologic    No Frequent  sneezing, Increased susceptibility to infection, Raynauds . Heart     No Chest Pain, Heart murmur, Irregular heart beat .  [name]  VITAL SIGNS  .  Pain scale 3, Ht-in 62, Wt-lbs 207, BMI 37.86, BP 127/87, HR 97.  [name]  EXAMINATION  .  Rheumatology:  General AppearanceAlert and oriented , No apparent distress .  HENT:No, alopecia .  Eyes:No, scleral icterus, scleral erythema .  ZO:XWRUEAV rate rhythm .  WUJ:WJXBJ LE edema .  Skin:No, rash .  Psych:No anxiety depression.  Muskuloskeletal Elbows, shoulders, hips, knees have intact ROM  with no synovitis. .  Skin and NailsMinimal sclerodactyly.  [name]  RAPID 3: Function(0-10): 1.0. Pain(0-10):7.5.  Patient Global(0-10):2.0.  .  ASSESSMENTS  .  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  Elevated liver enzymes - R74.8  .  Scleroderma - M34.9  .  Long-term use of high-risk medication - Z79.899  .  She still has severe synovitis. 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 There is also significant  risk of infection due to her IgG4 deficiency. She has been on  enbrel for 5 weeks now with no appreciable benefit. We discussed  the possibility of changing her medication balanced with  her  infection risk and liver disease. We settle on another 2-3 weeks  of enbrel and if that does not work, Humira may be an option. I  will plan to follow-up with her by phone at that time to solidify  the treatment plan. She is due for monitoring labs today. I will  see her back in 3 months for an in-office visit.  [name]  TREATMENT  .  Seronegative rheumatoid arthritis  Stop Pantoprazole Sodium Tablet Delayed Release, 40 mg, take 1  tablet, Orally, Once a day  Start Omeprazole Capsule  Delayed Release, 40 mg, 1 capsule,  Orally, Once a day, 90 days, 90 Capsule, Refills 3  Start Ranitidine HCl Capsule, 150 mg, 1 capsule at bedtime,  Orally, Once a day, 90 days, 90 Capsule, Refills 3  LAB: Alkaline Phosphatase (ALK)  Alkaline Phosphatase (ALK)     358     (40 - 130 - IU/L)  .  [name]  LAB: Bilirubin, Total  Bilirubin, Total     0.8     (0.2 - 1.1 - mg/dL)  .  [name]  LAB: Aspartate Aminotranferase (AST/SGOT)  Aspartate Aminotranferase (AST/SGOT)     56     (10 - 42 - IU/L)  .  [name]  LAB: Alanine Aminotransferase (ALT/SGPT)  Alanine Aminotransferase (ALT/SGPT)     135     (0 - 54 - IU/L)  .  [name]  LAB: Blood Urea Nitrogen (BUN)  Blood Urea Nitrogen     15     (6 - 24 - mg/dL)  .  [name]  LAB: Sedimentation Rate (ESR)  Sed Rate     43     (0 - 30 - mm)  .  [name]  LAB: C-Reactive Protein - CRP  C-Reactive Protein - CRP (Ultra-Wide Range)     4.37     (0.00 -  7.48 - mg/L)  .  [name]  LAB: CBC_DIFF  WBC     8.0     (4.0 - 11.0 - K/uL)  RBC     4.58     (3.70 - 5.00 - M/uL)  HGB     13.9     (11.0 - 15.0 - g/dL)  HCT     15.1     (76.1 - 45.0 - %)  MCV     90.8     (80.0 - 98.0 - fL)  MCH     30.3     (26.0 - 34.0 - pg)  MCHC     33.4     (32.0 - 36.0 - g/dL)  RDW     60.7     (37.1 - 14.5 - %)  PLT     242     (150 - 400 - K/uL)  MPV     10.5     (9.1 - 11.7 - fL)  SEG NEUT     63     ( - %)  LYMPH     25     ( - %)  MONO     10     ( - %)  EOS     2     ( - %)  BASO     1     ( - %)  NEUT #     5.0     (1.5 - 7.5 - K/uL)  LYMPH #     2.0     (1.0 - 4.0  - K/uL)  MONO #     0.8     (0.2 - 0.8 - K/uL)  EOSIN #     0.2     (0.0 - 0.5 - K/uL)  BASO #     0.0     (0.0 - 0.2 - K/uL)  Imm Grnas     0     ( - %)  Imm Grans, Abs     0.0     (0.0 - 0.1 - K/uL)  .  [name]  LAB: Creatinine (CR)  Creatinine (CR)     0.86     (  0.57 - 1.30 - mg/dL)  .  FOLLOW UP  .  3 Months  .  Electronically signed by Erasmo Leventhal , MD on  05/14/2016 at 10:33 AM EST  .  Document electronically signed by [name]

## 2016-05-09 ENCOUNTER — Ambulatory Visit: Admitting: Hand Surgery

## 2016-05-09 NOTE — Progress Notes (Signed)
* * *        **  [name]**    --- ---    [name], DOB: 1960-06-23    [address] [place], [place] [address]    Home: [phone]    Provider: Yetta Numbers        * * *    Telephone Encounter    ---    Answered by   Rufina Falco  Date: 05/09/2016         Time: 12:23 PM    Caller   patient    --- ---            Reason   re fax letter            Message                      Hello,            Patient wanted to have her clearance letter re-faxed to [phone] att. Richard. They stated they did not receive it the first time.                Action Taken   Muscadin,Rymonnce 05/09/2016 12:27:35 PM > Persico,Claudio  05/09/2016 12:57:38 PM > Refaxed.                * * *                ---          * * *          Patient: [name], [name] DOB: Apr 01, 1961 Provider: Yetta Numbers 05/09/2016    ---    Note generated by eClinicalWorks EMR/PM Software ([email or web address])

## 2016-05-12 ENCOUNTER — Ambulatory Visit: Admitting: Hand Surgery

## 2016-05-12 NOTE — Progress Notes (Signed)
* * *        **  [name]**    --- ---    7 Y old Female, DOB: Feb 03, 1961    [address] [place], [place] [address]    Home: [phone]    Provider: Yetta Numbers        * * *    Telephone Encounter    ---    Answered by   Teddy Spike  Date: 05/12/2016         Time: 08:52 AM    Caller   Patient    --- ---            Reason   Return to work letter            Message                      Hi,      Patient called and she is requesting to speak w/ Erasmo Score in regards of re-faxing the document. Patient states, her job haven't received the document yet: The fax number is [phone] att: Gerlene Burdock.                      Action Taken   Dorrelus,Fedner 05/12/2016 8:55:35 AM > Muscadin,Rymonnce  05/12/2016 10:31:01 AM > Muscadin,Rymonnce 05/12/2016 10:31:01 AM > Patient would  like the letter to be efaxed. phone number is [phone] Muscadin,Rymonnce  05/12/2016 3:06:16 PM > Patient provided an email address to send the documents  as well. Email is [email or web address] Persico,Claudio 05/12/2016 3:13:15 PM >  Faxed form to both fax numbers and emailed.                * * *                ---          * * *          Patient: Isensee, Rafeef DOB: [birth year] Provider: Yetta Numbers 05/12/2016    ---    Note generated by eClinicalWorks EMR/PM Software ([email or web address])

## 2016-06-09 ENCOUNTER — Ambulatory Visit

## 2016-06-09 NOTE — Telephone Encounter (Signed)
Call Details:   Patient PCP = [name], MD (JR 5)  [name] (Patient) called on June 09, 2016 12:08 PM.  Message taken by: Titus Mould  Primary call-back number: [phone]  Secondary call-back number: [phone]  Call Reason(s): Message/Call-Back      ** MESSAGE / CALL-BACK.  Regarding: patient faxed FML paprwork wanted to verify if they were filled out and sent out. Please give pateint a call back     ---------- ---------- ---------- ---------- ---------- ----------       RESPONSE/ORDERS:    Dr. Marlane Hatcher, did you see it? (It is not in your box)  ......................................Marland KitchenEwa Chomicki  June 09, 2016 3:15 PM    Hi I'm covering Mouli's desktop this week, I did not see it. Could you ask to send it again and we'll try to fill it out next week? Thanks  ......................................Marland KitchenGaylene Brooks, MD (DBM 5)  June 09, 2016 5:01 PM    pt stated she will fax over paperwork again today   ......................................Marland KitchenCharrette Roberson  June 16, 2016 12:01 PM    Ewa, can you put in my box and I'll fill it out? Thanks, Joe  .......................................[name], MD  June 16, 2016 5:04 PM    I don't see FMLA papers in Dr. Myles Lipps nor Dr. Barbaraann Barthel boxes. I left message for pt to fax them again......................................Marland KitchenEwa Chomicki  June 19, 2016 11:22 AM    Joe, form is in your inbox  pt notfied  ......................................Marland KitchenEwa Chomicki  June 23, 2016 11:44 AM    Ewa, I filled it out. Can you call her to find out where to fax? Thanks, Joe  .......................................[name], MD  June 23, 2016 5:14 PM    I faxed the form to the fax # that was on the bottom of the form.  I left message for pt at her cell #  ......................................Marland KitchenEwa Chomicki  June 26, 2016 11:18 AM             ORDERS/PROBS/MEDS/ALL     Problems:   ANNUAL EXAM (ICD-V72.31) (ICD10-Z00.00)  MAMMOGRAPHIC SCREENING FOR BREAST  CANCER (ICD-V76.12) (ICD10-Z12.31)  DIABETES MELLITUS, TYPE II (ICD-250.00) (ICD10-E11.9)  LONG-TERM (CURRENT) USE OF STEROIDS (ICD-V58.65) (ICD10-Z79.51)  OSTEOPOROSIS SCREENING (ICD-V82.81) (ICD10-Z13.820)  TRANSAMINASES, SERUM, ELEVATED (ICD-790.4) (ICD10-R74.0)  OBESITY (ICD-278.00) (ICD10-E66.9)      DIABETES MELLITUS, TYPE II, UNCONTROLLED (ICD-250.02) (ICD10-E11.65)      FATTY LIVER DISEASE (ICD-571.8) (ICD10-K76.0)  ? 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)  CARPAL TUNNEL (ICD-354.0) (ICD10-G56.00)  OSTEOARTHRITIS (ICD-715.09) (ICD10-M15.9)  S/P ROTATOR CUFF SURGERY 12/2004 - RT SHOULDER; 2008 LT (ICD-V45.89)  Family Hx of MELANOMA, FAMILY HX (ICD-V16.8) (ICD10-Z80.8)  FRACTURE OF OTHER SPEC SITE,  PATHOLOGIC - MULTIPLE (ICD-733.19)  CHOLECYSTECTOMY AND HERNIA REPAIR (ICD-V45.89)  VITAMIN D DEFICIENCY (ICD-268.9) (ICD10-E55.9)  VACCINATION WITH TDAP (ICD-V06.8) ([id number])  COLONOSCOPY (ICD-V76.51) (ICD10-Z01.89)    Meds (prior to this call):   PREDNISONE 10 MG ORAL TABLET (PREDNISONE) TK 1 T PO  ONCE Daily  LEVOTHYROXINE SODIUM 25 MCG ORAL TABLET (LEVOTHYROXINE SODIUM) Take one tablet by mouth once daily  OMEPRAZOLE 20 MG ORAL CAPSULE DELAYED RELEASE (OMEPRAZOLE) Take one capsule by mouth twice  a day  IBUPROFEN 600 MG ORAL TABLET (IBUPROFEN) 1 tab by mouth TID as needed for pain  BACLOFEN 10 MG ORAL TABLET (BACLOFEN) Please take half to one tablet as needed for back spasms  DICLOFENAC SODIUM 1 % TRANSDERMAL GEL (DICLOFENAC SODIUM) APP 2 GRAMS EXT AA BID  PROAIR HFA 108 (90  Base) MCG/ACT INHALATION AEROSOL SOLUTION (ALBUTEROL SULFATE) Inhale 2 puffs 4 times a day as needed for wheezing  LOTRISONE 1-0.05 % EXTERNAL CREAM (CLOTRIMAZOLE-BETAMETHASONE) apply to affected area on ankle twice a day      FREESTYLE FREEDOM LITE w/Device KIT (BLOOD GLUCOSE MONITORING SUPPL) use as directed (ICD 10 E11.65)      FREESTYLE LITE TEST IN VITRO STRIP (GLUCOSE BLOOD) check fingerstick three times a day (ICD 10 E11.65)      FREESTYLE LANCETS (LANCETS) check fingerstick three times a day (ICD 10 E11.65)  GLUCOPHAGE XR 500 MG ORAL TABLET EXTENDED RELEASE 24 HOUR (METFORMIN HCL) take four tablets by mouth daily every morning  ROSUVASTATIN CALCIUM 10 MG ORAL TABLET (ROSUVASTATIN CALCIUM) take one tablet by mouth daily  GLIPIZIDE ER 2.5 MG ORAL TABLET EXTENDED RELEASE 24 HOUR (GLIPIZIDE) take one tablet daily  TRAMADOL HCL 50 MG ORAL TABLET (TRAMADOL HCL) take one tablet daily as needed for pain  OXYCODONE HCL 5 MG ORAL TABLET (OXYCODONE HCL) Partial fill upon patient request.  Take one tablet daily as needed for pain >8  CALCIUM CARBONATE-VITAMIN D 600-200 MG-UNIT ORAL TABLET (CALCIUM CARBONATE-VITAMIN D) take one tablet twice a day  LIDODERM 5 % EXTERNAL PATCH (LIDOCAINE) apply one patch daily to affected area          Created By Titus Mould on 06/09/2016 at 12:08 PM    Electronically Signed By [name], MD on 06/26/2016 at 12:00 PM

## 2016-06-30 ENCOUNTER — Ambulatory Visit

## 2016-06-30 ENCOUNTER — Ambulatory Visit: Admitting: Internal Medicine

## 2016-06-30 ENCOUNTER — Ambulatory Visit: Admit: 2016-06-30 | Payer: No Typology Code available for payment source

## 2016-06-30 LAB — HX BF-CHEM/URINE
HX ALBUMIN RANDOM URINE: <0.5 mg/dL
HX CREATININE, RANDOM URINE: 37.92 mg/dL

## 2016-06-30 LAB — HX DIABETES: HX ALBUMIN RANDOM URINE: <0.5 mg/dL

## 2016-06-30 MED ORDER — OXYCODONE HCL: 1 | 15 | 0 refills | 0 days

## 2016-06-30 MED ORDER — GLIPIZIDE: 1 | 30 | 1 refills | 0 days

## 2016-06-30 NOTE — Progress Notes (Signed)
General Medicine Visit - Resident note with preceptor addendum  Service Due by Standard Protocol Rules: DIAB EYE EX, MAMMOGRAM, PATPORTALPI  N, HGBA1C.    .  Initial Screening   * [place]: [place] ([place])  Ht: 62.5 in.  Wt: 213 lbs.   BMI: 38.48  Temp: 98.1 deg F.     BP (Initial [place] Screening): 134 / 91      BP (Rechecked, Actionable): 134 /   91 mmHg   HR: 112     Healthy Eating materials? Weight Education Handout for low BMI  Travel outside of the [place] in past 28 days:: No  Chronic Pain Assessment: Does pt experience chronic pain ? YES  Severity of pain? (min=0, max=10) 8  Smoking Assessment: Tobacco use? current every day smoker  .  .  .  Med List: PRINTED by [place] [place] for patient   Med List: PRINTED by [facility] for patient   Patient consents for eRx History:  Paper  .  Falls Risk Assessment:   In the past year, have you ... Had no falls  Difficulty with balance? NO  Need assistance with ambulation while here? NO  Comments: ......................................Marland KitchenThereasa Parkin  June 30, 2016 4:04 PM  .  .  .  .  **Resident Note**  .  * Preceptor: Marvia Pickles)  CC: f/up DM  .  HPI: 4F here for follow up of diabetes. Fingersticks first thing in the mo  rning and at bedtime range in the 150s-200.  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 She is recovering well. Sbe i  s doing physical therapy.   .  .  Past Medical History:(reviewed)  see problem list  .  Family History: (reviewed)   Father: DMII, died of MI at 66  Mom: died of lung CA at 48  MGM and PGM: lung ca (smokers)  MGF and several other second degree relatives: brain aneurysms in their 46'  s.  2 maternal uncles:melanoma (one died of unknown cancer, one living)  brother recently diagnosed with systemic scleroderma  .  Social History: (reviewed)   Non-smoker, rare etoh, no IVDU. 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 Living together with her husband of /R/20 yea  rs. Sexually active with one female partner husband only. No  children. Always   wears a seat belt.  [name]  HEALTH MAINTENANCE  TDAP administered 11/04/14, next 2026  Mammogram ordered now (2017)  Pap smear next in 2018  DEXA 10/2015, WNL  RTC in the fall for influenza vaccine  Colonoscopy: done 2016, repeat 2017 and both times poor prep but overall ha  s been sufficient for screening purposes.  1 polyp removed, next colonoscop  y in 3 yrs (2020). At that time in 2020, will do 7 days of daily miralax. S  even days low fiber diet.   Two days clear liquids and a split prep with 2 dulcolax before each dose   of golytely.  .  .  Review of Systems: ROS otherwise negative except as noted in HPI  .  [name]  PROBLEMS:   PAST MEDICAL HISTORY (prior to today's visit):  ANNUAL EXAM (ICD-V72.31) (ICD10-Z00.00)  MAMMOGRAPHIC SCREENING FOR BREAST CANCER (ICD-V76.12) (ICD10-Z12.31)  DIABETES MELLITUS, TYPE II (ICD-250.00) (ICD10-E11.9)  LONG-TERM (CURRENT) USE OF STEROIDS (ICD-V58.65) (ICD10-Z79.51)  OSTEOPOROSIS SCREENING (ICD-V82.81) (ICD10-Z13.820)  TRANSAMINASES, SERUM, ELEVATED (ICD-790.4) (ICD10-R74.0)  OBESITY (ICD-278.00) (ICD10-E66.9)      DIABETES MELLITUS, TYPE  II, UNCONTROLLED (ICD-250.02) (ICD10-E11.65)      FATTY LIVER DISEASE (ICD-571.8) (ICD10-K76.0)  ? 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  [name]  MEDICATIONS:   PAST MEDICINES (prior to today's visit):  PREDNISONE 10 MG ORAL TABLET (PREDNISONE) TK 1 T PO  ONCE Daily  LEVOTHYROXINE SODIUM 25 MCG ORAL TABLET (LEVOTHYROXINE SODIUM) Take one tab  let by mouth once daily  OMEPRAZOLE 20 MG ORAL CAPSULE DELAYED RELEASE (OMEPRAZOLE) Take one capsule   by mouth twice a day  IBUPROFEN 600 MG ORAL TABLET (IBUPROFEN) 1 tab by mouth TID as needed for p  ain  BACLOFEN 10 MG ORAL TABLET (BACLOFEN) Please take half to one tablet as nee  ded for back spasms  DICLOFENAC SODIUM 1 % TRANSDERMAL GEL (DICLOFENAC SODIUM) APP 2 GRAMS EXT A  A BID  PROAIR HFA 108 (90 Base) MCG/ACT INHALATION AEROSOL SOLUTION (ALBUTEROL SUL  FATE) Inhale 2 puffs 4 times a day as needed for wheezing  LOTRISONE 1-0.05 % EXTERNAL CREAM (CLOTRIMAZOLE-BETAMETHASONE) apply to aff  ected area on ankle twice a day      FREESTYLE FREEDOM LITE w/Device KIT (BLOOD GLUCOSE MONITORING SUPPL) [place]  e as directed (ICD 10 E11.65)      FREESTYLE LITE TEST IN VITRO STRIP (GLUCOSE BLOOD) check fingerstick th  ree times a day (ICD 10 E11.65)      FREESTYLE LANCETS (LANCETS) check fingerstick three times a day (ICD 10   E11.65)  GLUCOPHAGE XR 500 MG ORAL TABLET EXTENDED RELEASE 24 HOUR (METFORMIN HCL) t  ake four tablets by mouth daily every morning  ROSUVASTATIN CALCIUM 10 MG ORAL TABLET (ROSUVASTATIN CALCIUM) take one tabl  et by mouth daily  GLIPIZIDE ER 2.5 MG ORAL TABLET EXTENDED RELEASE 24 HOUR (GLIPIZIDE) take o  ne tablet daily  TRAMADOL HCL 50 MG ORAL TABLET (TRAMADOL HCL) take one tablet daily as need  ed for pain  OXYCODONE HCL 5 MG ORAL TABLET (OXYCODONE HCL) Partial fill upon patient re  quest.  Take  one tablet daily as needed for pain >8  CALCIUM CARBONATE-VITAMIN D 600-200 MG-UNIT ORAL TABLET (CALCIUM CARBONATE-  VITAMIN D) take one tablet twice a day  LIDODERM 5 % EXTERNAL PATCH (LIDOCAINE) apply one patch daily to affected a  rea  .  MEDICINE CHANGES:  Removed medication of PROAIR HFA 108 (90 BASE) MCG/ACT INHALATION AEROSOL S  OLUTION (ALBUTEROL SULFATE) Inhale 2 puffs 4 times a day as needed for whee  zing - Signed  Removed medication of LOTRISONE 1-0.05 % EXTERNAL CREAM (CLOTRIMAZOLE-BETAM  ETHASONE) apply to affected area on ankle twice a day - Signed  Removed medication of LIDODERM 5 % EXTERNAL PATCH (LIDOCAINE) apply  one pat  ch daily to affected area - Signed  Added new medication of ENBREL 50 MG/ML SUBCUTANEOUS SOLUTION PREFILLED SYR  INGE (ETANERCEPT) take one injection every Saturday - Signed  Changed medication from GLIPIZIDE ER 2.5 MG ORAL TABLET EXTENDED RELEASE 24   HOUR (GLIPIZIDE) take one tablet daily to GLIPIZIDE ER 5 MG ORAL TABLET EX  TENDED RELEASE 24 HOUR (GLIPIZIDE) take 1 tablet by mouth daily - Signed  Rx of GLIPIZIDE ER 5 MG ORAL TABLET EXTENDED RELEASE 24 HOUR (GLIPIZIDE) ta  ke 1 tablet by mouth daily;  #30[Tablet] x 1;  Signed;  Entered by: [name], MD (JR 5);  Authorized by: [name], MD (JR 5);  Method used: El  ectronically to [facility] [id number] [place], [place]  [address]  [address], Ph: [phone], Fax: [phone]  Medications Reviewed:  Done  .  [name]  ALLERGIES:   No Known Allergies  Allergies Reviewed:  Done  .  [name]  DIRECTIVES:   .  .  .  Vitals:   BP: 134 / 91 mmHg Ht: 62.5 in.  Wt: 213 lbs.  BMI (in-lb) 38.48  Temp: 98.1deg F.   Pulse Rate: 112 bpm  .  .  Additional PE: Gen:  NAD  CV: RRR, no murmurs  Lungs: CTAB  .  [name]  Assessment /T/ Plan:   11F here for follow up of diabetes  .  #Diabetes:  A1c is 10.2.  -U/A ordered for microalbumin check  -eye exam - she still has ophtho referral for diabetic eye exam; she will c  all now to schedule  -diabetic foot  exam done in 2017 - due at end of 2018  -increase glipizide  back to 5. With A1c rising, it is less likely she will   develop hypoglycemia now. Advised her to take glipizide after a meal.  -f/up with Endocrinologist on April 20th  -continue metformin  -continue FS checks  -continue statin - tolerating well  -hold off on ACE-i until development of proteinuria  .  #Obesity/Weight loss: slow, but ongoing progress. Down 3 lbs since last vis  it.   -continue diet and exercise plan  -she is planning to set up an appt with the Nutritionist again  .  #Transaminitis, liver dysfunction  -f/up with Dr. Althea Charon  .  #Rheum issues and chronic prednisone use:  - F/up scheduled with [name]  -continue enbrel and prednisone  -Given prednisone use, DEXA in 10/2015 was done and was WNL.  -Already on omeprazole, Ca+Vit D  .  #Hypothyroidism: well controlled, no symptoms. continue current regimen of   levothyroxine.  .  Prevention/Health Maintenance  TDAP administered 11/04/14  Mammogram due June 2018  DEXA 10/2015, WNL  Pap smear next in 2018  RTC in the fall for influenza vaccine  Colonoscopy: done 2016, repeat 2017 and both times poor prep but overall ha  s been sufficient for screening purposes.   In combination, these two colon  oscopies have likely given an adequte colon cancer screening. Recommend rep  eat colonoscopy in 3 years.    1 polyp removed, next colonoscopy in 3 yrs (2020)   ***At that time in 2020, will do 7 days of daily miralax. Seven days low fi  ber diet.   Two days clear liquids and a split prep with 2 dulcolax before each dose   of golytely.  .  [name]  * Resident Signature (.sign): .......................................[name], MD (  JR 5)  June 30, 2016 5:01 PM  .  .  .  ORDERS:  Microalbumin urine [CPT-82043]  RTC (Return to Clinic) [RTC-000]  Est Level 3 [CPT-99213]  .  [name]  **Preceptor Note**  Resident: Marlane Hatcher ([place])  Problem Follow-up  .  [name]  Subjective   Patient is a 56 Years Old Female who presents to  clinic here for routine f/  u. Taking meds. Sugars fasting: 150-200 and  pre-dinner 150-200s  sees endo here, next appt in few wks  I discussed the patient with Dr. Marlane Hatcher [facility]) in a routine precepting enc  ounter.  I agree with the patient's diagnosis and management.    .  .  Objective   see resident's note for details.    .  Assessment   60F here for f/u diabetes. A1C higher at 10.2. Sugars reviewed.  .  Plan   - increase Glipizide to 5mg  daily and reviewed to take w or after eating  - continue Metformin  - f/u w Endocrine as scheduled  - Counseled on diet and exercise  - on prednisone; seeing Rheum   See resident note for details, Follow-up per resident note  .  Seen On Team (Floor):  4B  .  .  .......................................Pernell Dupre, MD  June 30, 2016 10:21   PM  .  Patient Care Plan  .  .  .  Immunization Worksheet 2016   .  ]  .  [name]  Electronically Signed by Pernell Dupre, MD on 06/30/2016 at 10:22 PM  ________________________________________________________________________

## 2016-07-03 LAB — HX POINT OF CARE: HX HGB A1C, POC: 10.2 % — ABNORMAL HIGH (ref 4.3–5.8)

## 2016-07-19 ENCOUNTER — Ambulatory Visit: Admitting: Rheumatology

## 2016-07-19 MED ORDER — Enbrel SureClick: 50 | 4 | 3 refills | 0 days | Status: AC

## 2016-07-19 NOTE — Progress Notes (Signed)
* * *        **  [name]**    --- ---    65 Y old Female, DOB: [birth year]    [address], [place], [place] [address]    Home: [phone]    Provider: [name]        * * *    Telephone Encounter    ---    Answered by   Oletha Blend  Date: 07/19/2016         Time: 03:35 PM    Reason   refill    --- ---            Message                      please review script and send to pharmacy above, thank you                Action Taken   KUMAR,SONAM A, PA-C 07/19/2016 4:16:15 PM >            Refills  Refill Enbrel SureClick Solution Auto-injector, 50 MG/ML,  Subcutaneous, 4, inject 50mg  (1 pen) sc, every week, 28 days, Refills=3    --- ---          * * *                ---          * * *          PatientDenny Peon, Haely DOB: 12-Apr-1960 Provider: [name] 07/19/2016    ---    Note generated by eClinicalWorks EMR/PM Software ([email or web address])

## 2016-07-21 ENCOUNTER — Ambulatory Visit: Admitting: Hand Surgery

## 2016-07-21 ENCOUNTER — Ambulatory Visit

## 2016-07-21 ENCOUNTER — Ambulatory Visit: Admit: 2016-07-21 | Payer: No Typology Code available for payment source

## 2016-07-23 ENCOUNTER — Ambulatory Visit

## 2016-07-24 ENCOUNTER — Ambulatory Visit

## 2016-07-24 ENCOUNTER — Ambulatory Visit: Admitting: Rheumatology

## 2016-07-24 MED ORDER — TRAMADOL HCL: 1 | 30 | 0 refills | 0 days | Status: AC

## 2016-07-24 MED ORDER — GLUCOSE BLOOD: 100 | 5 refills | 0 days

## 2016-07-24 MED ORDER — PredniSONE: 5 | Tablet | Freq: Every day | ORAL | 2 refills | 0 days | Status: AC

## 2016-07-24 NOTE — Progress Notes (Signed)
* * *        **  [name]**    --- ---    72 Y old Female, DOB: [birth year]    [address] [place] [address]    Home: [phone]    Provider: [name]        * * *    Telephone Encounter    ---    Answered by   Oletha Blend  Date: 07/24/2016         Time: 09:17 AM    Reason   refill    --- ---            Message                      Prednisone 5mg  sent                Refills  Refill PredniSONE Tablet, 5 mg, Orally, 60 Tablet, 2 tablet, Once a  day, 30 days, Refills=2    --- ---          * * *                ---          * * *          PatientSuan [name] DOB: October 12, 1960 Provider: [name] 07/24/2016    ---    Note generated by eClinicalWorks EMR/PM Software ([email or web address])

## 2016-08-04 ENCOUNTER — Ambulatory Visit: Admitting: Rheumatology

## 2016-08-04 ENCOUNTER — Ambulatory Visit

## 2016-08-04 ENCOUNTER — Ambulatory Visit: Admit: 2016-08-04 | Payer: 59

## 2016-08-04 LAB — HX HEM-ROUTINE
HX BASO #: 0 10*3/uL (ref 0.0–0.2)
HX BASO: 0 %
HX EOSIN #: 0 10*3/uL (ref 0.0–0.5)
HX EOSIN: 0 %
HX HCT: 42.3 % (ref 32.0–45.0)
HX HGB: 13.9 g/dL (ref 11.0–15.0)
HX IMMATURE GRANULOCYTE#: 0 10*3/uL (ref 0.0–0.1)
HX IMMATURE GRANULOCYTE: 0 %
HX LYMPH #: 2 10*3/uL (ref 1.0–4.0)
HX LYMPH: 22 %
HX MCH: 30.2 pg (ref 26.0–34.0)
HX MCHC: 32.9 g/dL (ref 32.0–36.0)
HX MCV: 91.8 fL (ref 80.0–98.0)
HX MONO #: 0.5 10*3/uL (ref 0.2–0.8)
HX MONO: 5 %
HX MPV: 10.4 fL (ref 9.1–11.7)
HX NEUT #: 6.4 10*3/uL (ref 1.5–7.5)
HX PLT: 254 10*3/uL (ref 150–400)
HX RBC BLOOD COUNT: 4.61 M/uL (ref 3.70–5.00)
HX RDW: 14.2 % (ref 11.5–14.5)
HX SEG NEUT: 72 %
HX WBC: 8.9 10*3/uL (ref 4.0–11.0)

## 2016-08-04 LAB — HX IMMUNOLOGY: HX C-REACTIVE PROTEIN - CRP: 4.97 mg/L (ref 0.00–7.48)

## 2016-08-04 LAB — HX HEM-MISC: HX SED RATE: 39 mm — ABNORMAL HIGH (ref 0–30)

## 2016-08-04 LAB — HX CHEM-PANELS
HX BLOOD UREA NITROGEN: 23 mg/dL (ref 6–24)
HX CREATININE (CR): 0.83 mg/dL (ref 0.57–1.30)
HX GFR, AFRICAN AMERICAN: 93 mL/min/{1.73_m2}
HX GFR, NON-AFRICAN AMERICAN: 80 mL/min/{1.73_m2} — ABNORMAL LOW

## 2016-08-04 LAB — HX CHEM-LFT
HX ALANINE AMINOTRANSFERASE (ALT/SGPT): 116 IU/L — ABNORMAL HIGH (ref 0–54)
HX ALKALINE PHOSPHATASE (ALK): 307 IU/L — ABNORMAL HIGH (ref 40–130)
HX ASPARTATE AMINOTRANFERASE (AST/SGOT): 57 IU/L — ABNORMAL HIGH (ref 10–42)
HX BILIRUBIN, TOTAL: 1.1 mg/dL (ref 0.2–1.1)

## 2016-08-04 MED ORDER — Humira Pen: 40 | 2 | 3 refills | 0 days | Status: AC

## 2016-08-04 NOTE — Progress Notes (Signed)
.  Progress Notes  .  Patient: [name], [name]  Provider: [name]  DOB: 01/16/1961 Age: 56 Y Sex: Female  .  PCP: [name]  MD  Date: 08/04/2016  .  --------------------------------------------------------------------------------  .  REASON FOR APPOINTMENT  .  1. 10WK FU  .  HISTORY OF PRESENT ILLNESS  .  GENERAL:   Ms. Court was last seen 05/05/16 and presents today for a follow  up of her scleroderma and inflammatory arthritis.Her hands are  swelling often, but it seems to go down Wednesday through  Saturday (she does the Enbrel shot on Saturdays). The swelling is  a little better now that the weather in warmer. She is still  taking Prednisone. She does not report any side effects from the  medications she is on. I, Coletta Memos, acted as Neurosurgeon for Dr.  [name]. Signature below:Shelagh Azzie Almas 08/04/16.  [name]  CURRENT MEDICATIONS  .  Taking Baclofen 10 MG Tablet 1/2 tab Orally prn  Taking Calcium Carbonate-Vitamin D 600-200 MG-UNIT Capsule 1  capsule with a meal Orally Twice a day  Taking Enbrel SureClick 50 MG/ML Solution Auto-injector inject  50mg  (1 pen) sc Subcutaneous every week, Notes: not yet, still  pending  Taking GlipiZIDE XL 5 MG Tablet Extended Release 24 Hour 1 tablet  Orally Once a day  Taking Glucophage XR 500 mg Tablet Extended Release 24 Hour 4  tablets Orally Once a day  Taking Hydroxychloroquine Sulfate 200 mg Tablet 1 tablet with  food or milk Orally Once a day  Taking Levothyroxine Sodium 25 MCG Tablet 1 tablet Orally Once a  day  Taking Omeprazole 40 mg Capsule Delayed Release 1 capsule Orally  Once a day  Taking PredniSONE 5 mg Tablet 1 tablet Orally Once a day  Taking Ranitidine HCl 150 mg Capsule 1 capsule at bedtime Orally  Once a day  Taking Rosuvastatin Calcium 10 MG Tablet 1 tablet Orally Once a  day  Taking Tramadol HCl 50 MG Tablet 1 tablet Orally every 6 hrs  Not-Taking/PRN Voltaren 1 % Gel 2 grams Transdermal twice daily,  Notes: PRN  .  PAST MEDICAL  HISTORY  .  Scleroderma - CREST dx 2007  IgG4 deficiency s/p IVIG 2010 ----single infusion given  preventively after week of bilateral knee replacements at [facility]  2010  Fx left wrist in 1994  Blood clot at LUE in 2006 on a short course of Coumadin  Bilateral carpal tunnel syndrome s/p Lt carpal tunnel release and  steroids injection right--  Bone spur left foot  Scoliosis  Spinal stenosis s/p steroids injection  s/p bilateral knee replacements for valgus /arthritic  complications, performed by Dr. Katrinka Blazing 2010--- never infected, but  packed with antibiotics with surgery;  Right rotator cuff repairs x 4, complicated by repeat tears,  infection, placement of anchor material  Elevated liver function tests  Diabetes  .  ALLERGIES  .  N.K.D.A.  .  SURGICAL HISTORY  .  Right rotator cuff repair, with infected hardware that had to be  removed 2006  Repeat right shoulder surgery, also which became infected. 2007  Left rotator cuff repair 2008  bilateral knee replacements 2009  Left arthroscopic carpal tunnel release 01/2011  ORIF Left 4th metatarsal bone  Left Ulna shortening 1994  Knee replacement 2010  Hand surgery 2013, 2015  remove gallbladder/hernia 1990  Plate left foot 3rd metatarsal 2008  .  FAMILY HISTORY  .  Mother: deceased 67 yrs, lung cancer,  hyperthyroidism, diagnosed  with Cancer  Father: deceased 9s yrs, heart attack, diagnosed with Heart  Disease  3 brother(s) .  FH of arthritis and sclerodermaFather deceased from MIMother  deceased age 41 with lung cancer Brother with scleroderma / copd.  .  SOCIAL HISTORY  .  .  Tobacco  history: Never smoked.  .  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  .  Alcohol Former daily EtOH use in 20s.  .  Nonsmoker.Lives with longstanding boyfriend.  [name]  HOSPITALIZATION/MAJOR DIAGNOSTIC PROCEDURE  .  as above  .  REVIEW OF SYSTEMS  .  ADULT Rheumatology ROS :  .  Constitutional    No Recent weight gain, Fatigue, Chills . Eyes     No Pain, Loss of vision, Itching eyes .  HENT    No Headache  Ringing in ears Loss of hearing . Respiratory    No Shortness of  breath, Cough, Hemoptysis . Gastrointestinal    No Nausea  Heartburn Diarrhea . Genitourinary    No Difficult urination, Pus  in urine, Blood in urine . Musculoskeletal    +joint pain/  swelling , +Joint pain/ Swelling  . Integumentary (skin and/or  breast)    No Easy bruising, Rash, Sun sensitive (sun allergy) .  Neurological System    No, Headaches, Dizziness, Fainting .  Psychiatric    No Excessive worries, Depression, Night sweats .  Endocrine    No Excessive thirst . Hematologic/Lymphatic    No  Swollen glands, Anemia, Lymphadenopathy . Allergic/Immunologic     No Frequent sneezing, Increased susceptibility to infection,  Raynauds . Heart    No Chest Pain, Heart murmur, Irregular heart  beat .  [name]  VITAL SIGNS  .  Pain scale 4.5, Ht-in 62, Wt-lbs 211, BMI 38.59, BP 146/87, HR  100.  [name]  EXAMINATION  .  RAPID 3: Function(0-10): 0.7. Pain(0-10):4.5.  Patient Global(0-10):0.  [name]  Rheumatology:  General AppearanceAlert and oriented , No apparent distress .  HENT:No, alopecia .  Eyes:No, scleral icterus, scleral erythema .  ZO:XWRUEAV rate rhythm .  WUJ:WJXBJ LE edema .  Skin:No, rash .  Psych:No anxiety depression.  MuskuloskeletalElbows, shoulders, hips, knees have intact ROM  with no synovitis. Hands have synovitis in the PIPs and MCPs  bilaterally..  Skin and NailsMinimal sclerodactyly.  .  ASSESSMENTS  .  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  Scleroderma - M34.9  .  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  Long-term use of high-risk medication - Z79.899  .  She is still having active synovitis in her hands despite Enbrel  therapy for 4 months and prednisone 5 mg. We again reviewed the  risk of immunosuppression in the setting of her IgG4 deficiency.  However given her severe synovitis and how she feels, she accepts  the risk. I therefore recommended she try Humira. I will check  monitoring labs today and complete the PA  process.  .  TREATMENT  .  Seronegative rheumatoid arthritis  Stop Enbrel SureClick Solution Auto-injector, 50 MG/ML, inject  50mg  (1 pen) sc, Subcutaneous, every week, Notes: not yet, still  pending  Start Humira Pen Pen-injector Kit, 40 MG/0.8ML, 0.8 ml,  Subcutaneous, every 2 weeks, 28 days, 1 box, Refills 3  LAB: Alkaline Phosphatase (ALK)  Alkaline Phosphatase (ALK)     307     (40 - 130 - IU/L)  .  [name]  LAB: Bilirubin, Total  Bilirubin, Total     1.1     (  0.2 - 1.1 - mg/dL)  .  [name]  LAB: Aspartate Aminotranferase (AST/SGOT)  Aspartate Aminotranferase (AST/SGOT)     57     (10 - 42 - IU/L)  .  [name]  LAB: Alanine Aminotransferase (ALT/SGPT)  Alanine Aminotransferase (ALT/SGPT)     116     (0 - 54 - IU/L)  .  [name]  LAB: Blood Urea Nitrogen (BUN)  Blood Urea Nitrogen     23     (6 - 24 - mg/dL)  .  [name]  LAB: Sedimentation Rate (ESR)  Sed Rate     39     (0 - 30 - mm)  .  [name]  LAB: C-Reactive Protein - CRP  C-Reactive Protein - CRP (Ultra-Wide Range)     4.97     (0.00 -  7.48 - mg/L)  .  [name]  LAB: CBC_DIFF  WBC     8.9     (4.0 - 11.0 - K/uL)  RBC     4.61     (3.70 - 5.00 - M/uL)  HGB     13.9     (11.0 - 15.0 - g/dL)  HCT     24.4     (01.0 - 45.0 - %)  MCV     91.8     (80.0 - 98.0 - fL)  MCH     30.2     (26.0 - 34.0 - pg)  MCHC     32.9     (32.0 - 36.0 - g/dL)  RDW     27.2     (53.6 - 14.5 - %)  PLT     254     (150 - 400 - K/uL)  MPV     10.4     (9.1 - 11.7 - fL)  SEG NEUT     72     ( - %)  LYMPH     22     ( - %)  MONO     5     ( - %)  EOS     0     ( - %)  BASO     0     ( - %)  NEUT #     6.4     (1.5 - 7.5 - K/uL)  LYMPH #     2.0     (1.0 - 4.0 - K/uL)  MONO #     0.5     (0.2 - 0.8 - K/uL)  EOSIN #     0.0     (0.0 - 0.5 - K/uL)  BASO #     0.0     (0.0 - 0.2 - K/uL)  Imm Grnas     0     ( - %)  Imm Grans, Abs     0.0     (0.0 - 0.1 - K/uL)  .  [name]  LAB: Creatinine (CR)  Creatinine (CR)     0.83     (0.57 - 1.30 - mg/dL)  .  FOLLOW UP  .  10 weeks  .  Electronically signed by Erasmo Leventhal , MD on  08/12/2016 at  08:20 AM EDT  .  Document electronically signed by [name]

## 2016-08-04 NOTE — Progress Notes (Signed)
* * *        [name]**    --- ---    56 Y old Female, DOB: October 09, 1960, External MRN: [id number]    Account Number: [id number]    [address] [id number]    Home: [phone]    Guarantor: [name] Insurance: [place]P [place]    PCP: [name], MD Referring: Fortunato Curling, MD    Appointment Facility: Rheumatology        * * *    08/04/2016  Progress Notes: [name], MD **CHN#:** [id number]    --- ---    ---        Reason for Appointment    ---      1\. 10WK FU    ---      History of Present Illness    ---     _GENERAL_ :    [name] was last seen 05/05/16 and presents today for a follow up of her  scleroderma and inflammatory arthritis.    Her hands are swelling often, but it seems to go down Wednesday through  Saturday (she does the Enbrel shot on Saturdays). The swelling is a little  better now that the weather in warmer.    She is still taking Prednisone. She does not report any side effects from the  medications she is on.    I, Coletta Memos, acted as Neurosurgeon for [name]. Signature below:    Coletta Memos, Scribe 08/04/16.      Current Medications    ---    Taking     * Baclofen 10 MG Tablet 1/2 tab Orally prn    ---    * Calcium Carbonate-Vitamin D 600-200 MG-UNIT Capsule 1 capsule with a meal Orally Twice a day    ---    * Enbrel SureClick 50 MG/ML Solution Auto-injector inject 50mg  (1 pen) sc Subcutaneous every week, Notes: not yet, still pending    ---    * GlipiZIDE XL 5 MG Tablet Extended Release 24 Hour 1 tablet Orally Once a day    ---    * Glucophage XR 500 mg Tablet Extended Release 24 Hour 4 tablets Orally Once a day    ---    * Hydroxychloroquine Sulfate 200 mg Tablet 1 tablet with food or milk Orally Once a day    ---    * Levothyroxine Sodium 25 MCG Tablet 1 tablet Orally Once a day    ---    * Omeprazole 40 mg Capsule Delayed Release 1 capsule Orally Once a day    ---    * PredniSONE 5 mg Tablet 1 tablet Orally Once a day    ---    * Ranitidine HCl 150 mg Capsule 1  capsule at bedtime Orally Once a day    ---    * Rosuvastatin Calcium 10 MG Tablet 1 tablet Orally Once a day    ---    * Tramadol HCl 50 MG Tablet 1 tablet Orally every 6 hrs    ---    Not-Taking/PRN    * Voltaren 1 % Gel 2 grams Transdermal twice daily, Notes: PRN    ---      Past Medical History    ---       Scleroderma - CREST dx 2007.        ---    IgG4 deficiency s/p IVIG 2010 ----single infusion given preventively after  week of bilateral knee replacements at [facility]  2010.        ---    Fx left wrist in 1994.        ---    Blood clot at LUE in 2006 on a short course of Coumadin.        ---    Bilateral carpal tunnel syndrome s/p Lt carpal tunnel release and steroids  injection right--.        ---    Bone spur left foot.        ---    Scoliosis.        ---    Spinal stenosis s/p steroids injection.        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 Katrinka Blazing 2010--- never infected, but packed with antibiotics with  surgery;.        ---    Right rotator cuff repairs x 4, complicated by repeat tears, infection,  placement of anchor material.        ---    Elevated liver function tests.        ---    Diabetes.        ---      Surgical History    ---      Right rotator cuff repair, with infected hardware that had to be removed  2006    ---    Repeat right shoulder surgery, also which became infected. 2007    ---    Left rotator cuff repair 2008    ---    bilateral knee replacements 2009    ---    Left arthroscopic carpal tunnel release 01/2011    ---    ORIF Left 4th metatarsal bone    ---    Left Ulna shortening 1994    ---    Knee replacement 2010    ---    Hand surgery 2013, 2015    ---    remove gallbladder/hernia 1990    ---    Plate left foot 3rd metatarsal 2008    ---      Family History    ---      Mother: deceased 37 yrs, lung cancer, hyperthyroidism, diagnosed with Cancer    ---    Father: deceased 49s yrs, heart attack, diagnosed with Heart Disease    ---    3  brother(s) .    ---    FH of arthritis and scleroderma    Father deceased from MI    Mother deceased age 61 with lung cancer    Brother with scleroderma / copd.    ---      Social History    ---    Tobacco history: Never smoked.    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    Alcohol  Former daily EtOH use in 20s.   Nonsmoker.    Lives with longstanding boyfriend.    ---      Allergies    ---      N.K.D.A.    ---      Hospitalization/Major Diagnostic Procedure    ---      as above    ---      Review of Systems    ---     _ADULT Rheumatology ROS_ :    Constitutional No Recent weight gain, Fatigue, Chills. Eyes No Pain, Loss of  vision, Itching eyes. HENT No Headache Ringing in ears Loss of hearing.  Respiratory No Shortness of breath,  Cough, Hemoptysis. Gastrointestinal No  Nausea Heartburn Diarrhea. Genitourinary No Difficult urination, Pus in urine,  Blood in urine. Musculoskeletal **+joint pain/ swelling** , **+Joint pain/  Swelling** . Integumentary (skin and/or breast) No Easy bruising, Rash, Sun  sensitive (sun allergy). Neurological System No, Headaches, Dizziness,  Fainting. Psychiatric No Excessive worries, Depression, Night sweats.  Endocrine No Excessive thirst. Hematologic/Lymphatic No Swollen glands,  Anemia, Lymphadenopathy. Allergic/Immunologic No Frequent sneezing, Increased  susceptibility to infection, Raynauds. Heart No Chest Pain, Heart murmur,  Irregular heart beat.          Vital Signs    ---    Pain scale 4.5, Ht-in 62, Wt-lbs 211, BMI 38.59, BP 146/87, HR 100.      Examination    ---     _RAPID 3_ :    Function (0-10): 0.7.        Pain (0-10): 4.5.        Patient Global (0-10): 0.    _Rheumatology_ :    General Appearance Alert and oriented , No apparent distress .    HENT: No, alopecia .    Eyes: No, scleral icterus, scleral erythema .    CV: regular rate rhythm .    Ext: trace LE edema .    Skin: No, rash .    Psych: No anxiety depression.    Muskuloskeletal Elbows, shoulders, hips,  knees have intact ROM with no  synovitis. Hands have synovitis in the PIPs and MCPs bilaterally..    Skin and Nails Minimal sclerodactyly.          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 Scleroderma - M34.9    ---    3\. 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 Long-term use of high-risk medication - Z79.899    ---      She is still having active synovitis in her hands despite Enbrel therapy for  4 months and prednisone 5 mg. We again reviewed the risk of immunosuppression  in the setting of her IgG4 deficiency. However given her severe synovitis and  how she feels, she accepts the risk. I therefore recommended she try Humira. I  will check monitoring labs today and complete the PA process.    ---      Treatment    ---       **1\. Seronegative rheumatoid arthritis**    Stop Enbrel SureClick Solution Auto-injector, 50 MG/ML, inject 50mg  (1 pen)  sc, Subcutaneous, every week, Notes: not yet, still pending    Start Humira Pen Pen-injector Kit, 40 MG/0.8ML, 0.8 ml, Subcutaneous, every 2  weeks, 28 days, 1 box, Refills 3    _LAB: Alkaline Phosphatase (ALK)_   Alkaline Phosphatase (ALK)  307  H  40 -  130 - IU/L    --- --- --- ---    _LAB: Bilirubin, Total_ Bilirubin, Total  1.1    0.2 - 1.1 - mg/dL    --- --- --- ---    _LAB: Aspartate Aminotranferase (AST/SGOT)_ Aspartate Aminotranferase  (AST/SGOT)  57  H  10 - 42 - IU/L    --- --- --- ---    _LAB: Alanine Aminotransferase (ALT/SGPT)_ Alanine Aminotransferase  (ALT/SGPT)  116  H  0 - 54 - IU/L    --- --- --- ---    _LAB: Blood Urea Nitrogen (BUN)_ Blood Urea Nitrogen  23    6 - 24 -  mg/dL    --- --- --- ---    _LAB: Sedimentation Rate (ESR)_ Sed Rate  39  H  0 - 30 - mm    --- --- --- ---    _LAB: C-Reactive Protein - CRP_ C-Reactive Protein - CRP (Ultra-Wide Range)   4.97    0.00 - 7.48 - mg/L    --- --- --- ---    _LAB: CBC_DIFF_ WBC  8.9    4.0 - 11.0 - K/uL    --- --- --- ---    RBC  4.61    3.70 - 5.00 - M/uL     --- --- --- ---    HGB  13.9    11.0 - 15.0 - g/dL    --- --- --- ---    HCT  42.3    32.0 - 45.0 - %    --- --- --- ---    MCV  91.8    80.0 - 98.0 - fL    --- --- --- ---    MCH  30.2    26.0 - 34.0 - pg    --- --- --- ---    MCHC  32.9    32.0 - 36.0 - g/dL    --- --- --- ---    RDW  14.2    11.5 - 14.5 - %    --- --- --- ---    PLT  254    150 - 400 - K/uL    --- --- --- ---    MPV  10.4    9.1 - 11.7 - fL    --- --- --- ---    SEG NEUT  72     \- %    --- --- --- ---    LYMPH  22     \- %    --- --- --- ---    MONO  5     \- %    --- --- --- ---    EOS  0     \- %    --- --- --- ---    BASO  0     \- %    --- --- --- ---    NEUT #  6.4    1.5 - 7.5 - K/uL    --- --- --- ---    LYMPH #  2.0    1.0 - 4.0 - K/uL    --- --- --- ---    MONO #  0.5    0.2 - 0.8 - K/uL    --- --- --- ---    EOSIN #  0.0    0.0 - 0.5 - K/uL    --- --- --- ---    BASO #  0.0    0.0 - 0.2 - K/uL    --- --- --- ---    Imm Grnas  0     \- %    --- --- --- ---    Imm Grans, Abs  0.0    0.0 - 0.1 - K/uL    --- --- --- ---    _LAB: Creatinine (CR)_ Creatinine (CR)  0.83    0.57 - 1.30 - mg/dL    --- --- --- ---      Follow Up    ---    10 weeks    Electronically signed by Erasmo Leventhal , MD on 08/12/2016 at 08:20 AM EDT    Sign off status: Completed        * * *  Rheumatology    [address]    [place], [place] [address]    Tel: [phone]    Fax: [phone]              * * *          Patient: NEKITA, PITA DOB: July 31, 1960 Progress Note: [name], MD  08/04/2016    ---    Note generated by eClinicalWorks EMR/PM Software ([email or web address])

## 2016-08-07 MED ORDER — Humira Pen: 40 | box | 3 refills | 0 days | Status: AC

## 2016-08-17 ENCOUNTER — Ambulatory Visit: Admitting: Rheumatology

## 2016-08-17 NOTE — Progress Notes (Signed)
* * *        **  [name]**    --- ---    45 Y old Female, DOB: 04-24-1960    [address], [place], [place] [address]    Home: [phone]    Provider: [name]        * * *    Telephone Encounter    ---    Answered by   Angelica Pou  Date: 08/17/2016         Time: 10:22 AM    Caller   pt    --- ---            Reason   call back            Message                      Hello, pt request to speak with MD in regards to discussing recent flu like symptoms she have. She stated she is coughing, cannot sawllow, have bright yellow phlem and nose dripping. It started last night. She was prescribed with Humira and Enbrel and not sure ihow she should proceed. Pt's ph: [phone]. Thank you                Action Taken   [facility] 08/17/2016 10:23:24 AM > KUMAR,SONAM A, PA-C  08/17/2016 11:42:00 AM > Took Enbrel on Saturday. Developed viral symptoms  yesterday. Due for Humira on Saturday. Asked patient to track her symptoms. If  no improvement by Saturday/Sunday see PCP and hold on the humira dose until  she is feeling better.                * * *                ---          * * *          PatientHarlem [name], [name] DOB: [birth year] Provider: [name] 08/17/2016    ---    Note generated by eClinicalWorks EMR/PM Software ([email or web address])

## 2016-08-20 ENCOUNTER — Ambulatory Visit

## 2016-08-21 ENCOUNTER — Ambulatory Visit

## 2016-08-21 MED ORDER — LEVOTHYROXINE SODIUM: 1 | 90 | 1 refills | 0 days | Status: DC

## 2016-08-21 MED ORDER — GLIPIZIDE: 1 | 30 | 1 refills | 0 days

## 2016-09-20 ENCOUNTER — Ambulatory Visit

## 2016-09-20 ENCOUNTER — Ambulatory Visit: Admitting: Internal Medicine

## 2016-09-20 ENCOUNTER — Ambulatory Visit: Admit: 2016-09-20 | Payer: No Typology Code available for payment source

## 2016-09-25 ENCOUNTER — Ambulatory Visit: Admitting: Rheumatology

## 2016-09-25 ENCOUNTER — Ambulatory Visit

## 2016-09-25 MED ORDER — ROSUVASTATIN CALCIUM: 1 | 90 | 1 refills | 0 days

## 2016-09-25 MED ORDER — METFORMIN HCL: 360 | 1 refills | 0 days

## 2016-09-25 MED ORDER — Hydroxychloroquine Sulfate: 200 | Tablet | Freq: Every day | 2 refills | 0 days | Status: AC

## 2016-09-25 NOTE — Telephone Encounter (Signed)
Call Details:   Patient PCP = [name], [name]  [name] (Patient) called on September 25, 2016 12:10 PM.  Message taken by: Leeroy Bock  Primary call-back number: [phone]    Secondary call-back number: [phone]    Call Reason(s): Message/Call-Back      ** MESSAGE / CALL-BACK.  Regarding: patient is requesting a call back from Dr Marlane Hatcher in regards to a rash. please have Dr Marlane Hatcher contact the patient.     ---------- ---------- ---------- ---------- ---------- ----------       RESPONSE/ORDERS:    I am covering Dr. Barbaraann Barthel desktop while she is on ICU. Called patient, she reports outbreak of a rash on her trunk she was told was Shingles. Saturday had soreness on chest, then noticed rash on chest and shoulder, spreading to neck. Rash is very painful. Went to urgent care yesterday in [place], there was given Valacylovir 1gram every 8 hours for 10 days and was told was consistent with shingles. No medication given for pain, and she's been taking some home NSAIDs but still with severe pain. She takes etanercept for ?scleroderma and she communicated this information with rheumatology who instructed her to stop this for now. She reports painful rash extending up to her left posterior ear with mild headache, but no visual or hearing changes, no rash on face or in ear. No fever, chills, neck pain, chest pain, dyspnea. I said that because of persistent significant pain she should come to clinic today for urgent care appointment, and asked her to call us to schedule.     Would we be able to confirm her urgent care appointment with [place] this afternoon? Thanks- Jesusita Oka ......................................Marland KitchenPaulita Cradle, [name]  September 26, 2016 1:08 PM  09/26/16 @ 1440  Thanks .......................................[name]  September 26, 2016 2:50 PM           [[id number]]    ORDERS/PROBS/MEDS/ALL     Problems:   MAMMOGRAPHIC SCREENING FOR BREAST CANCER (ICD-V76.12) (ICD10-Z12.31)  DIABETES MELLITUS, TYPE II  (ICD-250.00) (ICD10-E11.9)  LONG-TERM (CURRENT) USE OF STEROIDS (ICD-V58.65) (ICD10-Z79.51)  OSTEOPOROSIS SCREENING (ICD-V82.81) (ICD10-Z13.820)  TRANSAMINASES, SERUM, ELEVATED (ICD-790.4) (ICD10-R74.0)  OBESITY (ICD-278.00) (ICD10-E66.9)      DIABETES MELLITUS, TYPE II, UNCONTROLLED (ICD-250.02) (ICD10-E11.65)      FATTY LIVER DISEASE (ICD-571.8) (ICD10-K76.0)  ? 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)  CARPAL TUNNEL (ICD-354.0) (ICD10-G56.00)  OSTEOARTHRITIS (ICD-715.09) (ICD10-M15.9)  S/P ROTATOR CUFF SURGERY 12/2004 - RT SHOULDER; 2008 LT (ICD-V45.89)  Family Hx of MELANOMA, FAMILY HX (ICD-V16.8) (ICD10-Z80.8)  FRACTURE OF OTHER SPEC SITE,  PATHOLOGIC - MULTIPLE (ICD-733.19)  CHOLECYSTECTOMY AND HERNIA REPAIR (ICD-V45.89)  VITAMIN D DEFICIENCY (ICD-268.9) (ICD10-E55.9)  VACCINATION WITH TDAP (ICD-V06.8) ([id number])  COLONOSCOPY (ICD-V76.51) (ICD10-Z01.89)    Meds (prior to this call):   PREDNISONE 10 MG ORAL TABLET (PREDNISONE) TK 1 T PO  ONCE Daily  LEVOTHYROXINE SODIUM 25 MCG ORAL TABLET (LEVOTHYROXINE SODIUM) Take one tablet by mouth once daily  OMEPRAZOLE 20 MG ORAL CAPSULE DELAYED RELEASE (OMEPRAZOLE) Take one capsule by mouth twice a day  IBUPROFEN 600 MG ORAL TABLET (IBUPROFEN) 1 tab by mouth TID as needed for pain  BACLOFEN 10 MG ORAL TABLET (  BACLOFEN) Please take half to one tablet as needed for back spasms  DICLOFENAC SODIUM 1 % TRANSDERMAL GEL (DICLOFENAC SODIUM) APP 2 GRAMS EXT AA BID      FREESTYLE FREEDOM LITE w/Device KIT (BLOOD GLUCOSE MONITORING SUPPL) use as  directed (ICD 10 E11.65)      FREESTYLE LITE TEST IN VITRO STRIP (GLUCOSE BLOOD) check fingerstick three times a day (ICD 10 E11.65)      FREESTYLE LANCETS (LANCETS) check fingerstick three times a day (ICD 10 E11.65)  GLUCOPHAGE XR 500 MG ORAL TABLET EXTENDED RELEASE 24 HOUR (METFORMIN HCL) take four tablets by mouth daily every morning  ROSUVASTATIN CALCIUM 10 MG ORAL TABLET (ROSUVASTATIN CALCIUM) take one tablet by mouth daily  GLIPIZIDE ER 5 MG ORAL TABLET EXTENDED RELEASE 24 HOUR (GLIPIZIDE) take 1 tablet by mouth daily  TRAMADOL HCL 50 MG ORAL TABLET (TRAMADOL HCL) take one tablet daily as needed for pain  OXYCODONE HCL 5 MG ORAL TABLET (OXYCODONE HCL) Partial fill upon patient request.  Take one tablet daily as needed for pain >8  CALCIUM CARBONATE-VITAMIN D 600-200 MG-UNIT ORAL TABLET (CALCIUM CARBONATE-VITAMIN D) take one tablet twice a day  ENBREL 50 MG/ML SUBCUTANEOUS SOLUTION PREFILLED SYRINGE (ETANERCEPT) take one injection every Saturday          Created By Leeroy Bock on 09/25/2016 at 12:10 PM    Electronically Signed By [name], MD on 09/26/2016 at 04:36 PM

## 2016-09-25 NOTE — Progress Notes (Signed)
* * *        **  [name]**    --- ---    68 Y old Female, DOB: Aug 06, 1960    [address] [place], [place] [address]    Home: [phone]    Provider: [name]        * * *    Telephone Encounter    ---    Answered by   Oletha Blend  Date: 09/25/2016         Time: 03:52 PM    Reason   refill    --- ---            Refills  Refill Hydroxychloroquine Sulfate Tablet, 200 mg, Orally, 30 Tablet,  1 tablet with food or milk, Once a day, 30 days, Refills=2    --- ---          * * *                ---          * * *          PatientSuan [name] DOB: 07-24-1960 Provider: [name] 09/25/2016    ---    Note generated by eClinicalWorks EMR/PM Software ([email or web address])

## 2016-09-26 ENCOUNTER — Ambulatory Visit

## 2016-09-26 ENCOUNTER — Ambulatory Visit: Admitting: Rheumatology

## 2016-09-26 ENCOUNTER — Ambulatory Visit: Admitting: Internal Medicine

## 2016-09-26 ENCOUNTER — Ambulatory Visit: Admit: 2016-09-26 | Payer: 59

## 2016-09-26 MED ORDER — GABAPENTIN: 1 | 30 | 0 refills | 0 days

## 2016-09-26 NOTE — Progress Notes (Signed)
General Medicine Visit - Resident note with preceptor addendum  Service Due by Standard Protocol Rules: DIAB EYE EX, LDL, MAMMOGRAM, PATPOR  TALPIN, HGBA1C.    .  Initial Screening   * [place]: [place] ([place])  Ht: 62.5 in.  Wt: 209 lbs.   BMI: 37.75  Temp: 98.7 deg F.     BP (Initial [place] Screening): 122 / 84      BP (Rechecked, Actionable): 122 /   84 mmHg   HR: 97     Health Mgmt materials? Weight Education Handout for high BMI  Travel outside of the [place] in past 28 days:: No  .  Med List: PRINTED by [place] for patient   Chronic Pain Assessment: Does pt experience chronic pain ? NO  Smoking Assessment: Tobacco use? never smoker  .  .  .  Falls Risk Assessment:   In the past year, have you ... Had no falls  Difficulty with balance? NO  Need assistance with ambulation while here? NO  Behavioral Health Tools:   PHQ2 Screening Score 0  .  Comments: ......................................Marland KitchenErskine Squibb [place]  September 26, 2016 2:45 PM  .  .  .  .  **Resident Note**  .  * Preceptor: Jackquline Berlin)  CC: shingles  .  HPI: Friday, she was sore across her right chest from the center to her sho  ulder.  Saturday when she woke up she was having spasms up the back of the   right side of her neck.  She noticed a rash on her right shoulder Saturday.    She continues to have spasms that are also present on her ear.  She went   to urgent care last night and was diagnosed with shingles.  She was started   on valacyclovir.  She has not seen a rash in any other part of her body.    She spoke to rheum clinic because of her symptoms and was instructed to sto  p taking her Humira.  She has tried Tylenol with no relief.  .  .  Past Medical History:(reviewed)  see problem list  .  .  .  .  .  .  Review of Systems: ROS negative except for above in HPI.  .  .  PROBLEMS:   PAST MEDICAL HISTORY (prior to today's visit):  MAMMOGRAPHIC SCREENING FOR BREAST CANCER (ICD-V76.12) (ICD10-Z12.31)  DIABETES MELLITUS, TYPE II (ICD-250.00) (ICD10-E11.9)  LONG-TERM  (CURRENT) USE OF STEROIDS (ICD-V58.65) (ICD10-Z79.51)  OSTEOPOROSIS SCREENING (ICD-V82.81) (ICD10-Z13.820)  TRANSAMINASES, SERUM, ELEVATED (ICD-790.4) (ICD10-R74.0)  OBESITY (ICD-278.00) (ICD10-E66.9)      DIABETES MELLITUS, TYPE II, UNCONTROLLED (ICD-250.02) (ICD10-E11.65)      FATTY LIVER DISEASE (ICD-571.8) (ICD10-K76.0)  ? 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  [name]  MEDICATIONS:   PAST MEDICINES (prior to today's visit):  PREDNISONE 10 MG ORAL TABLET (PREDNISONE) TK 1 T PO  ONCE Daily  LEVOTHYROXINE SODIUM 25 MCG ORAL TABLET (LEVOTHYROXINE SODIUM) Take one tab  let by mouth once daily  OMEPRAZOLE 20 MG ORAL CAPSULE DELAYED RELEASE (OMEPRAZOLE) Take one capsule   by mouth twice a day  IBUPROFEN 600 MG ORAL TABLET (IBUPROFEN) 1 tab by mouth TID as needed for p  ain  BACLOFEN 10 MG ORAL TABLET (BACLOFEN) Please take half to one tablet as nee  ded for back spasms  DICLOFENAC SODIUM 1 % TRANSDERMAL GEL (DICLOFENAC  SODIUM) APP 2 GRAMS EXT A  A BID      FREESTYLE FREEDOM LITE w/Device KIT (BLOOD GLUCOSE MONITORING SUPPL) [place]  e as directed (ICD 10 E11.65)      FREESTYLE LITE TEST IN VITRO STRIP (GLUCOSE BLOOD) check fingerstick th  ree times a day (ICD 10 E11.65)      FREESTYLE LANCETS (LANCETS) check fingerstick three times a day (ICD 10   E11.65)  GLUCOPHAGE XR 500 MG ORAL TABLET EXTENDED RELEASE 24 HOUR (METFORMIN HCL) t  ake four tablets by mouth daily every morning  ROSUVASTATIN CALCIUM 10 MG ORAL TABLET (ROSUVASTATIN CALCIUM) take one tabl  et by mouth daily  GLIPIZIDE ER 5 MG ORAL TABLET EXTENDED RELEASE 24 HOUR (GLIPIZIDE) take 1 t  ablet by mouth daily  TRAMADOL HCL 50 MG ORAL TABLET (TRAMADOL HCL) take one tablet daily as need  ed for pain  OXYCODONE HCL 5 MG ORAL TABLET (OXYCODONE HCL) Partial fill upon patient re  quest.  Take one tablet daily as needed for pain >8  CALCIUM CARBONATE-VITAMIN D 600-200 MG-UNIT ORAL TABLET (CALCIUM CARBONATE-  VITAMIN D) take one tablet twice a day  ENBREL 50 MG/ML SUBCUTANEOUS SOLUTION PREFILLED SYRINGE (ETANERCEPT) take o  ne injection every Saturday  .  MEDICINE CHANGES:  Added new medication of VALACYCLOVIR HCL 1 GM ORAL TABLET (VALACYCLOVIR HCL  ) one tablet by mouth twice daily - Signed  Removed medication of ENBREL 50 MG/ML SUBCUTANEOUS SOLUTION PREFILLED SYRIN  GE (ETANERCEPT) take one injection every Saturday - Signed  Added new medication of HUMIRA 40 MG/0.4ML SUBCUTANEOUS PREFILLED SYRINGE K  IT (ADALIMUMAB) - Signed  Added new medication of GABAPENTIN 300 MG ORAL CAPSULE (GABAPENTIN) one cap  sule by mouth at bedtime as needed for pain - Signed  Changed medication from HUMIRA 40 MG/0.4ML SUBCUTANEOUS PREFILLED SYRINGE K  IT (ADALIMUMAB) to HUMIRA 40 MG/0.4ML SUBCUTANEOUS PREFILLED SYRINGE KIT (A  DALIMUMAB) - Signed  Added new medication of HYDROXYCHLOROQUINE SULFATE 200 MG ORAL TABLET (HYDR  OXYCHLOROQUINE SULFATE) one tablet oral daily - Signed  Rx of GABAPENTIN 300 MG  ORAL CAPSULE (GABAPENTIN) one capsule by mouth at b  edtime as needed for pain;  #30[Capsule] x 0;  Signed;  Entered by: Tonny Branch, MD [facility]);  Authorized by: Tonny Branch, MD [facility]);  Method used: Daisy Lazar to [id number]* (retail), [address], [place], [place]  [id number] , Ph: [phone], Fax: [phone]  Medications Reviewed:  Done  .  [name]  ALLERGIES:   .  [name]  DIRECTIVES:   .  .  .  Vitals:   BP: 122 / 84 mmHg Ht: 62.5 in.  Wt: 209 lbs.  BMI (in-lb) 37.75  Temp: 98.7deg F.   Pulse Rate: 97 bpm  .  .  Additional PE: General: alert and oriented in no acute distress  HEENT: no oropharyngeal exudates, tympanic membranes clear bilaterally, ext  ernal auditory canals normal without vesicular rash bilaterally  CV: RRR, normal S1/S2, no murmurs  Respiratory: lungs clear to auscultation bilaterally with no wheezes, rales  , or rhonchi  Extremities: 2+ distal pulses, no lower extremity edema  Skin: vesicular rash present over right upper chest, anterior shoulder and   extending behind right ear  Neuropsychiatric: no focal deficits, cranial nerves grossly intact  .  .  .  Assessment /T/ Plan:   Postherpetic neuralgia: shingles outbreak confined to a single dermatome, n  o evidence of disseminated shingles  - continue Valtrex 1 gm BID  - start gabepentin 300 mg PO QHS, can uptirate to TID as needed for pain if   able to tolerate.  Counseled patient not to take with tramadol, oxycodone,   or baclofen.  Patient states she has not needed to take any of these medic  ations lately.  Counseled patient that this medication may make her drowsy.  - counseled patient on the contagious nature of her illness  .  .  .  * Resident Signature (.sign): ......................................Marland KitchenBethan  y Saks, MD [facility])  September 26, 2016 4:44 PM  .  .  .  ORDERS:  Est Level 3 [CPT-99213]  .  [name]  **Preceptor Note**  Resident: Tonny Branch)  Problem Urgent Care  .  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 Started on valtrex. Comes in with shingles related pain. No SOB   or other symptoms  I discussed the patient with Dr. Bonnielee Haff [facility]) in a routine precepting en  counter.  I agree with the patient's diagnosis and management.    .  .  Objective   see resident's note for additional details, see resident's note for details  .    [name]  Assessment   55yo female with hx limited scleroderma presents for shingles related pain  .  Plan   -Continue valtrex  -Continue NSAIDs, tylenol  -Start gabapentin  -Holding humira per rheum  -Rec precautions: keep lesions covered until crusted over  See resident note for details, Follow-up per resident note  .  Seen On Team (Floor):  4B  .  [name]  .  Patient Care Plan  .  .  .  Immunization Worksheet 2016   .  .  [name]  Electronically Signed by Jackquline Berlin, MD on 09/28/2016 at 11:17 AM  ________________________________________________________________________

## 2016-09-26 NOTE — Progress Notes (Signed)
General Medicine Visit - Resident note with preceptor addendum  Service Due by Standard Protocol Rules: DIAB EYE EX, LDL, MAMMOGRAM, PATPORTALPIN, HGBA1C.      Initial Screening   * [place]: [place] ([place]aryna)  Ht: 62.5 in.  Wt: 209 lbs.   BMI: 37.75  Temp: 98.7 deg F.     BP (Initial [place] Screening): 122 / 84      BP (Rechecked, Actionable): 122 / 84 mmHg   HR: 97     Health Mgmt materials? Weight Education Handout for high BMI  Travel outside of the [place] in past 28 days:: No    Med List: PRINTED by [place] for patient   Chronic Pain Assessment: Does pt experience chronic pain ? NO  Smoking Assessment: Tobacco use? never smoker        Falls Risk Assessment:   In the past year, have you ... Had no falls  Difficulty with balance? NO  Need assistance with ambulation while here? NO  Behavioral Health Tools:   PHQ2 Screening Score 0    Comments: ......................................Marland KitchenErskine Squibb [facility]  September 26, 2016 2:45 PM          **Resident Note**    * Preceptor: Sol Blazing [facility])  CC: shingles    HPI: Friday, she was sore across her right chest from the center to her shoulder.  Saturday when she woke up she was having spasms up the back of the right side of her neck.  She noticed a rash on her right shoulder Saturday.  She continues to have spasms that are also present on her ear.  She went to urgent care last night and was diagnosed with shingles.  She was started on valacyclovir.  She has not seen a rash in any other part of her body.  She spoke to rheum clinic because of her symptoms and was instructed to stop taking her Humira.  She has tried Tylenol with no relief.      Past Medical History:(reviewed)  see problem list              Review of Systems: ROS negative except for above in HPI.      PROBLEMS:   PAST MEDICAL HISTORY (prior to today's visit):  MAMMOGRAPHIC SCREENING FOR BREAST CANCER (ICD-V76.12) (ICD10-Z12.31)  DIABETES MELLITUS, TYPE II (ICD-250.00) (ICD10-E11.9)  LONG-TERM (CURRENT) USE OF STEROIDS (ICD-V58.65)  (ICD10-Z79.51)  OSTEOPOROSIS SCREENING (ICD-V82.81) (ICD10-Z13.820)  TRANSAMINASES, SERUM, ELEVATED (ICD-790.4) (ICD10-R74.0)  OBESITY (ICD-278.00) (ICD10-E66.9)      DIABETES MELLITUS, TYPE II, UNCONTROLLED (ICD-250.02) (ICD10-E11.65)      FATTY LIVER DISEASE (ICD-571.8) (ICD10-K76.0)  ? 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)  CARPAL TUNNEL (ICD-354.0) (ICD10-G56.00)  OSTEOARTHRITIS (ICD-715.09) (ICD10-M15.9)  S/P ROTATOR CUFF SURGERY 12/2004 - RT SHOULDER; 2008 LT (ICD-V45.89)  Family Hx of MELANOMA, FAMILY HX (ICD-V16.8) (ICD10-Z80.8)  FRACTURE OF OTHER SPEC SITE,  PATHOLOGIC - MULTIPLE (ICD-733.19)  CHOLECYSTECTOMY AND HERNIA REPAIR (ICD-V45.89)  VITAMIN D DEFICIENCY (ICD-268.9) (ICD10-E55.9)  VACCINATION WITH TDAP (ICD-V06.8) (XBJ47-W29)  COLONOSCOPY (ICD-V76.51) (ICD10-Z01.89)  PROBLEM CHANGES:  Added new problem of SHINGLES (ICD-053.9) (ICD10-B02.9) - Signed     Problems Reviewed:  Done      MEDICATIONS:   PAST  MEDICINES (prior to today's visit):  PREDNISONE 10 MG ORAL TABLET (PREDNISONE) TK 1 T PO  ONCE Daily  LEVOTHYROXINE SODIUM 25 MCG ORAL TABLET (LEVOTHYROXINE SODIUM) Take one tablet by mouth once daily  OMEPRAZOLE 20 MG ORAL CAPSULE DELAYED RELEASE (OMEPRAZOLE) Take one capsule by mouth twice a day  IBUPROFEN 600 MG ORAL TABLET (IBUPROFEN) 1 tab by mouth TID as needed for pain  BACLOFEN 10 MG ORAL TABLET (BACLOFEN) Please take half to one tablet as needed for back spasms  DICLOFENAC SODIUM 1 % TRANSDERMAL GEL (DICLOFENAC SODIUM) APP 2 GRAMS EXT AA BID      FREESTYLE  FREEDOM LITE w/Device KIT (BLOOD GLUCOSE MONITORING SUPPL) use as directed (ICD 10 E11.65)      FREESTYLE LITE TEST IN VITRO STRIP (GLUCOSE BLOOD) check fingerstick three times a day (ICD 10 E11.65)      FREESTYLE LANCETS (LANCETS) check fingerstick three times a day (ICD 10 E11.65)  GLUCOPHAGE XR 500 MG ORAL TABLET EXTENDED RELEASE 24 HOUR (METFORMIN HCL) take four tablets by mouth daily every morning  ROSUVASTATIN CALCIUM 10 MG ORAL TABLET (ROSUVASTATIN CALCIUM) take one tablet by mouth daily  GLIPIZIDE ER 5 MG ORAL TABLET EXTENDED RELEASE 24 HOUR (GLIPIZIDE) take 1 tablet by mouth daily  TRAMADOL HCL 50 MG ORAL TABLET (TRAMADOL HCL) take one tablet daily as needed for pain  OXYCODONE HCL 5 MG ORAL TABLET (OXYCODONE HCL) Partial fill upon patient request.  Take one tablet daily as needed for pain >8  CALCIUM CARBONATE-VITAMIN D 600-200 MG-UNIT ORAL TABLET (CALCIUM CARBONATE-VITAMIN D) take one tablet twice a day  ENBREL 50 MG/ML SUBCUTANEOUS SOLUTION PREFILLED SYRINGE (ETANERCEPT) take one injection every Saturday    MEDICINE CHANGES:  Added new medication of VALACYCLOVIR HCL 1 GM ORAL TABLET (VALACYCLOVIR HCL) one tablet by mouth twice daily - Signed  Removed medication of ENBREL 50 MG/ML SUBCUTANEOUS SOLUTION PREFILLED SYRINGE (ETANERCEPT) take one injection every Saturday - Signed  Added new medication of HUMIRA 40 MG/0.4ML SUBCUTANEOUS PREFILLED SYRINGE KIT (ADALIMUMAB) - Signed  Added new medication of GABAPENTIN 300 MG ORAL CAPSULE (GABAPENTIN) one capsule by mouth at bedtime as needed for pain - Signed  Changed medication from HUMIRA 40 MG/0.4ML SUBCUTANEOUS PREFILLED SYRINGE KIT (ADALIMUMAB) to HUMIRA 40 MG/0.4ML SUBCUTANEOUS PREFILLED SYRINGE KIT (ADALIMUMAB) - Signed  Added new medication of HYDROXYCHLOROQUINE SULFATE 200 MG ORAL TABLET (HYDROXYCHLOROQUINE SULFATE) one tablet oral daily - Signed  Rx of GABAPENTIN 300 MG ORAL CAPSULE (GABAPENTIN) one capsule by mouth at bedtime as needed for pain;   #30[Capsule] x 0;  Signed;  Entered by: Tonny Branch, MD [facility]);  Authorized by: Tonny Branch, MD [facility]);  Method used: Electronically to [facility] [id number]* (retail), [address], [place], [place]  [id number] , Ph: [phone], Fax: [phone]     Medications Reviewed:  Done      ALLERGIES:          DIRECTIVES:         Vitals:   BP: 122 / 84 mmHg Ht: 62.5 in.  Wt: 209 lbs.  BMI (in-lb) 37.75  Temp: 98.7deg F.   Pulse Rate: 97 bpm      Additional PE: General: alert and oriented in no acute distress  HEENT: no oropharyngeal exudates, tympanic membranes clear bilaterally, external auditory canals normal without vesicular rash bilaterally  CV: RRR, normal S1/S2, no murmurs  Respiratory: lungs clear to auscultation bilaterally with no wheezes, rales, or rhonchi  Extremities: 2+ distal pulses, no lower extremity edema  Skin: vesicular rash  present over right upper chest, anterior shoulder and extending behind right ear  Neuropsychiatric: no focal deficits, cranial nerves grossly intact        Assessment & Plan:   Postherpetic neuralgia: shingles outbreak confined to a single dermatome, no evidence of disseminated shingles  - continue Valtrex 1 gm BID  - start gabepentin 300 mg PO QHS, can uptirate to TID as needed for pain if able to tolerate.  Counseled patient not to take with tramadol, oxycodone, or baclofen.  Patient states she has not needed to take any of these medications lately.  Counseled patient that this medication may make her drowsy.  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 Started  on valtrex. Comes in with shingles related pain. No SOB or other symptoms      I discussed the patient with Dr. Bonnielee Haff [facility]) in a routine precepting encounter.  I agree with the patient's diagnosis and management.        Objective   see resident's note for additional details, see resident's note for details.      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

## 2016-09-26 NOTE — Progress Notes (Signed)
Checkout  Return to Clinic No RTC  Appointment Made No          Created By Mertha Finders on 09/26/2016 at 03:16 PM    Electronically Signed By Mertha Finders on 09/26/2016 at 03:16 PM

## 2016-09-26 NOTE — Progress Notes (Signed)
* * *        **  [name]**    --- ---    13 Y old Female, DOB: 01/15/1961    [address] [place], [place] [address]    Home: [phone]    Provider: [name]        * * *    Telephone Encounter    ---    Answered by   Kendal Hymen  Date: 09/26/2016         Time: 09:37 AM    Reason   Shingles DX and Humira    --- ---            Message                      Patient called and stated that she was diagnosed with Shingles yesterday and wonders if she should still take Humira on Sunday.  Instructed patient to hold Humira until she finishes med and is clear of infection                Action Taken   [place] , LPN 05/03/8655 8:46:96 AM > [name] F  10/06/2016 4:50:51 PM > Agreed                * * *                ---          * * *          Patient: [name] DOB: Jan 30, 1961 Provider: [name] 09/26/2016    ---    Note generated by eClinicalWorks EMR/PM Software ([email or web address])

## 2016-10-02 ENCOUNTER — Ambulatory Visit

## 2016-10-02 NOTE — Telephone Encounter (Signed)
Call Details:   Called patient to discuss her paperwork in my mail box for short term disability, but she states she no longer needs it. She will call for an UC appt on Thurs to have her shingles recovery evaluated and to get a note for work stating she may return. Her shingles is improving, per her report. She has noted her FS are higher this week than usual. She will continue monitoring this. She was supposed to see Endocrine but was not able to. I asked her to call to reschedule that appt with them, and then she will follow up with me afterwards. .......................................[name], MD  October 02, 2016 4:01 PM        RESPONSE/ORDERS:                 ORDERS/PROBS/MEDS/ALL     Problems:   SHINGLES (ICD-053.9) (ICD10-B02.9)  MAMMOGRAPHIC SCREENING FOR BREAST CANCER (ICD-V76.12) (ICD10-Z12.31)  DIABETES MELLITUS, TYPE II (ICD-250.00) (ICD10-E11.9)  LONG-TERM (CURRENT) USE OF STEROIDS (ICD-V58.65) (ICD10-Z79.51)  OSTEOPOROSIS SCREENING (ICD-V82.81) (ICD10-Z13.820)  TRANSAMINASES, SERUM, ELEVATED (ICD-790.4) (ICD10-R74.0)  OBESITY (ICD-278.00) (ICD10-E66.9)      DIABETES MELLITUS, TYPE II, UNCONTROLLED (ICD-250.02) (ICD10-E11.65)      FATTY LIVER DISEASE (ICD-571.8) (ICD10-K76.0)  ? 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)  CARPAL TUNNEL (ICD-354.0) (ICD10-G56.00)  OSTEOARTHRITIS (ICD-715.09) (ICD10-M15.9)  S/P ROTATOR CUFF SURGERY 12/2004 - RT SHOULDER; 2008 LT (ICD-V45.89)  Family Hx of MELANOMA, FAMILY HX (ICD-V16.8) (ICD10-Z80.8)  FRACTURE OF OTHER SPEC SITE,   PATHOLOGIC - MULTIPLE (ICD-733.19)  CHOLECYSTECTOMY AND HERNIA REPAIR (ICD-V45.89)  VITAMIN D DEFICIENCY (ICD-268.9) (ICD10-E55.9)  VACCINATION WITH TDAP (ICD-V06.8) ([id number])  COLONOSCOPY (ICD-V76.51) (ICD10-Z01.89)    Meds (prior to this call):   PREDNISONE 10 MG ORAL TABLET (PREDNISONE) TK 1 T PO  ONCE Daily  LEVOTHYROXINE SODIUM 25 MCG ORAL TABLET (LEVOTHYROXINE SODIUM) Take one tablet by mouth once daily  OMEPRAZOLE 20 MG ORAL CAPSULE DELAYED RELEASE (OMEPRAZOLE) Take one capsule by mouth twice a day  IBUPROFEN 600 MG ORAL TABLET (IBUPROFEN) 1 tab by mouth TID as needed for pain  BACLOFEN 10 MG ORAL TABLET (BACLOFEN) Please take half to one tablet as needed for back spasms  DICLOFENAC SODIUM 1 % TRANSDERMAL GEL (DICLOFENAC SODIUM) APP 2 GRAMS EXT AA BID      FREESTYLE FREEDOM LITE w/Device KIT (BLOOD GLUCOSE MONITORING SUPPL) use as directed (ICD 10 E11.65)      FREESTYLE LITE TEST IN VITRO STRIP (GLUCOSE BLOOD) check fingerstick three times a day (ICD 10 E11.65)      FREESTYLE LANCETS (LANCETS) check fingerstick three times a day (ICD 10 E11.65)  GLUCOPHAGE XR 500 MG ORAL TABLET EXTENDED RELEASE 24 HOUR (METFORMIN HCL) take four tablets by mouth daily every morning  ROSUVASTATIN CALCIUM 10 MG ORAL TABLET (ROSUVASTATIN CALCIUM) take one tablet by mouth daily  GLIPIZIDE ER 5 MG ORAL TABLET EXTENDED RELEASE 24 HOUR (GLIPIZIDE) take 1 tablet by mouth daily  TRAMADOL HCL 50 MG ORAL TABLET (TRAMADOL HCL) take one tablet daily as needed for pain  OXYCODONE HCL 5 MG ORAL TABLET (OXYCODONE HCL) Partial fill upon patient request.  Take one tablet daily as needed for pain >8  CALCIUM  CARBONATE-VITAMIN D 600-200 MG-UNIT ORAL TABLET (CALCIUM CARBONATE-VITAMIN D) take one tablet twice a day  VALACYCLOVIR HCL 1 GM ORAL TABLET (VALACYCLOVIR HCL) one tablet by mouth twice daily  HUMIRA 40 MG/0.4ML SUBCUTANEOUS PREFILLED SYRINGE KIT (ADALIMUMAB)   GABAPENTIN 300 MG ORAL CAPSULE (GABAPENTIN) one capsule by mouth at bedtime as  needed for pain  HYDROXYCHLOROQUINE SULFATE 200 MG ORAL TABLET (HYDROXYCHLOROQUINE SULFATE) one tablet oral daily          Created By [name], MD on 10/02/2016 at 03:58 PM    Electronically Signed By [name], MD on 10/02/2016 at 04:01 PM

## 2016-10-05 ENCOUNTER — Ambulatory Visit

## 2016-10-05 ENCOUNTER — Ambulatory Visit: Admitting: Internal Medicine

## 2016-10-05 ENCOUNTER — Ambulatory Visit: Admit: 2016-10-05 | Payer: 59

## 2016-10-05 NOTE — Progress Notes (Signed)
General Medicine Visit - Resident note with preceptor addendum  Service Due by Standard Protocol Rules: DIAB EYE EX, LDL, PATPORTALPIN, HGB  A1C.    .  Initial Screening   * [place]: Sheikh (Nafisa)  Ht: 62.5 in.  Wt: 211 lbs.   BMI: 38.11  Temp: 97.9 deg F.     BP (Initial [place] Screening): 106 / 72      BP (Rechecked, Actionable): 106 /   72 mmHg   HR: 87     Health Mgmt materials? Weight Education Handout for high BMI  Travel outside of the [place] in past 28 days:: No  .  Med List: PRINTED by [facility] for patient   Chronic Pain Assessment: Does pt experience chronic pain ? YES  Severity of pain? (min=0, max=10) 3  Smoking Assessment: Tobacco use? never smoker  .  .  .  Falls Risk Assessment:   In the past year, have you ... Had no falls  Difficulty with balance? NO  Need assistance with ambulation while here? NO  .  Comments: Medication List given.  HGBA1c- done in office  .  ......................................Marland KitchenNafisa Sheikh  October 05, 2016 8:16 AM  .  .  **Resident Note**  .  * Preceptor: Dionne Bucy)  CC: Shingles follow up  .  HPI: Ms Steelman has had shingles for the past 1.5 weeks. She comes to clinic   today to get a work release to return to work.   .  The shingles rash has been improving significantly, as has the pain. She oc  casionally uses a cream to help with the pain and has been taking Aleve/Tyl  enol for the pain. The pain is worst in the back of her neck and on her ear  . There is still an "achy" pain in her collar bone to her right shoulder. G  iven her history of Staph infections, she wants to be sure there isn't anyt  hing in the shoulder.   .  .  Past Medical History:(reviewed)  see problem list  .  Family History: (reviewed)   Father: DMII, died of MI at 69  Mom: died of lung CA at 56  MGM and PGM: lung ca (smokers)  MGF and several other second degree relatives: brain aneurysms in their 25'  s.  2 maternal uncles:melanoma (one died of unknown cancer, one living)  brother recently diagnosed with systemic  scleroderma  .  Social History: (reviewed)   Non-smoker, rare etoh, no IVDU. 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 Living together with her husband of /R/20 yea  rs. Sexually active with one female partner husband only. No children. Always   wears a seat belt.  [name]  HEALTH MAINTENANCE  TDAP administered 11/04/14, next 2026  Mammogram ordered now (2017)  Pap smear next in 2018  DEXA 10/2015, WNL  RTC in the fall for influenza vaccine  Colonoscopy: done 2016, repeat 2017 and both times poor prep but overall ha  s been sufficient for screening purposes.  1 polyp removed, next colonoscop  y in 3 yrs (2020). At that time in 2020, will do 7 days of daily miralax. S  even days low fiber diet.   Two days clear liquids and a split prep with 2 dulcolax before each dose   of golytely.  .  .  Review of Systems: ROS  Gen: No F/C   Eye: No diplopia, vision loss  Ears/Nose/Throat:  No tinnitus or hearing loss  Cardiovascular: No CP, syncope  Respiratory: No cough, dyspnea  Gastrointestinal: No N/V/D/C, abd pain  Genitourinary: No dysuria, hematuria, urinary freq  Musculoskeletal: No back pain, jt pain or swelling  Skin: + Healing rash, susp lesions  Neurologic: No weakness, HA  Endocrine: No polydipsia/uria, wt change  Heme/Lymphatic: No abnormal bruising, bleeding, enlarged lymph nodes  All other systems unremarkable  .  .  .  PROBLEMS:   PAST MEDICAL HISTORY (prior to today's visit):  SHINGLES (ICD-053.9) (ICD10-B02.9)  MAMMOGRAPHIC SCREENING FOR BREAST CANCER (ICD-V76.12) (ICD10-Z12.31)  DIABETES MELLITUS, TYPE II (ICD-250.00) (ICD10-E11.9)  LONG-TERM (CURRENT) USE OF STEROIDS (ICD-V58.65) (ICD10-Z79.51)  OSTEOPOROSIS SCREENING (ICD-V82.81) (ICD10-Z13.820)  TRANSAMINASES, SERUM, ELEVATED (ICD-790.4) (ICD10-R74.0)  OBESITY (ICD-278.00) (ICD10-E66.9)      DIABETES MELLITUS, TYPE II, UNCONTROLLED (ICD-250.02) (ICD10-E11.65)      FATTY LIVER DISEASE (ICD-571.8) (ICD10-K76.0)  ? 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  [name]  MEDICATIONS:   PAST MEDICINES (prior to today's visit):  PREDNISONE 10 MG ORAL TABLET (PREDNISONE) TK 1 T PO  ONCE Daily  LEVOTHYROXINE SODIUM 25 MCG ORAL TABLET (LEVOTHYROXINE SODIUM) Take one tab  let by mouth once daily  OMEPRAZOLE 20 MG ORAL CAPSULE DELAYED RELEASE (OMEPRAZOLE) Take one capsule   by mouth twice a day  IBUPROFEN 600 MG ORAL TABLET (IBUPROFEN) 1 tab by mouth TID as needed for p  ain  BACLOFEN 10 MG ORAL TABLET (BACLOFEN) Please take half to one tablet as nee  ded for back spasms  DICLOFENAC SODIUM 1 % TRANSDERMAL GEL (DICLOFENAC SODIUM) APP 2 GRAMS EXT A  A BID      FREESTYLE FREEDOM LITE w/Device KIT (BLOOD GLUCOSE MONITORING SUPPL) [place]  e as directed (ICD 10 E11.65)      FREESTYLE LITE TEST IN VITRO STRIP (GLUCOSE BLOOD) check fingerstick th  ree times a day (ICD 10 E11.65)      FREESTYLE LANCETS (LANCETS) check fingerstick three times a day (ICD 10   E11.65)  GLUCOPHAGE XR 500 MG ORAL TABLET EXTENDED RELEASE 24 HOUR (METFORMIN HCL) t  ake four tablets by mouth daily every  morning  ROSUVASTATIN CALCIUM 10 MG ORAL TABLET (ROSUVASTATIN CALCIUM) take one tabl  et by mouth daily  GLIPIZIDE ER 5 MG ORAL TABLET EXTENDED RELEASE 24 HOUR (GLIPIZIDE) take 1 t  ablet by mouth daily  TRAMADOL HCL 50 MG ORAL TABLET (TRAMADOL HCL) take one tablet daily as need  ed for pain  OXYCODONE HCL 5 MG ORAL TABLET (OXYCODONE HCL) Partial fill upon patient re  quest.  Take one tablet daily as needed for pain >8  CALCIUM CARBONATE-VITAMIN D 600-200 MG-UNIT ORAL TABLET (CALCIUM CARBONATE-  VITAMIN D) take one tablet twice a day  VALACYCLOVIR HCL 1 GM ORAL TABLET (VALACYCLOVIR HCL) one  tablet by mouth tw  ice daily  HUMIRA 40 MG/0.4ML SUBCUTANEOUS PREFILLED SYRINGE KIT (ADALIMUMAB)   GABAPENTIN 300 MG ORAL CAPSULE (GABAPENTIN) one capsule by mouth at bedtime   as needed for pain  HYDROXYCHLOROQUINE SULFATE 200 MG ORAL TABLET (HYDROXYCHLOROQUINE SULFATE)   one tablet oral daily  .  .  .  ALLERGIES:   .  [name]  DIRECTIVES:   .  .  .  Vitals:   BP: 106 / 72 mmHg Ht: 62.5 in.  Wt: 211 lbs.  BMI (in-lb) 38.11  Temp: 97.9deg F.   Pulse Rate: 87 bpm  .  .  Additional PE: Gen: Well developed, well nourished, in NAD  Skin: Well healing with minimal scabbing lesions along right collar bone an  d extending across the right neck. One more erythematous lesion on the righ  t upper ear. No pustulance, no oozing, no signs of infection.   Shoulder: Full ROM without pain, no asymmetry between shoulders, no pain to   palpation.  .  .  Assessment /T/ Plan:   Ms Gonsoulin is a 56 year old woman who presents for shingles follow up.  .  # Shingles - healing  - Provided information on warning signs for secondary bacterial infection o  f the lesions and to return to clinic immediately if this occurs.   - Wrote 'return to work' note and provided it to the patient  - Recommended continuing NSAIDs for pain  - Also provided guidance on warning signs for shoulder infection  .  # Type 2 DM  - Got an A1c today - 8.7%  - Down from 10.2% last  .  RTC  PRN  .  .  .  * Resident Signature (.sign): ......................................Marland KitchenGean Quint, MD (DBM)  October 05, 2016 8:53 AM  .  .  [name]  ORDERS:  Est Level 3 [CPT-99213]  .  [name]  **Preceptor Note**  Resident: Heron Sabins)  [facility]  .  [name]  Subjective   Patient is a 56 Years Old Female who presents to clinic shingles follow up.   all scabbed over and feels well   I discussed the patient with Dr. Kinnie Scales Judeth Cornfield) in a routine precepting   encounter.  I also personally interviewed and examined the patient.  I agre  e with the patient's diagnosis and management.   .  .  Objective   gen-nad  right anterior chest wall with scabbed, dry lesions  see resident's note for details.    .  Assessment   shingles--resolving well. ok to go back to work  .  Plan   Follow-up per resident note  .  Seen On Team (Floor):  5  .  .  .  Patient Care Plan  .  .  .  Immunization Worksheet 2016   .  .  [name]  Electronically Signed by Kyra Leyland, MD on 10/06/2016 at 3:19 PM  ________________________________________________________________________

## 2016-10-05 NOTE — Progress Notes (Signed)
General Medicine Visit - Resident note with preceptor addendum  Service Due by Standard Protocol Rules: DIAB EYE EX, LDL, PATPORTALPIN, HGBA1C.      Initial Screening   * [place]: Sheikh (Nafisa)  Ht: 62.5 in.  Wt: 211 lbs.   BMI: 38.11  Temp: 97.9 deg F.     BP (Initial [place] Screening): 106 / 72      BP (Rechecked, Actionable): 106 / 72 mmHg   HR: 87     Health Mgmt materials? Weight Education Handout for high BMI  Travel outside of the [place] in past 28 days:: No    Med List: PRINTED by [facility] for patient   Chronic Pain Assessment: Does pt experience chronic pain ? YES  Severity of pain? (min=0, max=10) 3  Smoking Assessment: Tobacco use? never smoker        Falls Risk Assessment:   In the past year, have you ... Had no falls  Difficulty with balance? NO  Need assistance with ambulation while here? NO    Comments: Medication List given.  HGBA1c- done in office    ......................................Marland KitchenNafisa Sheikh  October 05, 2016 8:16 AM      **Resident Note**    * Preceptor: Letta Pate Selena Batten)  CC: Shingles follow up    HPI: Ms Shain has had shingles for the past 1.5 weeks. She comes to clinic today to get a work release to return to work.     The shingles rash has been improving significantly, as has the pain. She occasionally uses a cream to help with the pain and has been taking Aleve/Tylenol for the pain. The pain is worst in the back of her neck and on her ear. There is still an "achy" pain in her collar bone to her right shoulder. Given her history of Staph infections, she wants to be sure there isn't anything in the shoulder.       Past Medical History:(reviewed)  see problem list    Family History: (reviewed)   Father: DMII, died of MI at 73  Mom: died of lung CA at 62  MGM and PGM: lung ca (smokers)  MGF and several other second degree relatives: brain aneurysms in their 66's.  2 maternal uncles:melanoma (one died of unknown cancer, one living)  brother recently diagnosed with systemic scleroderma    Social History:  (reviewed)   Non-smoker, rare etoh, no IVDU. 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 Living together with her husband of ~20 years. Sexually active with one female partner husband only. No children. Always wears a seat belt.    HEALTH MAINTENANCE  TDAP administered 11/04/14, next 2026  Mammogram ordered now (2017)  Pap smear next in 2018  DEXA 10/2015, WNL  RTC in the fall for influenza vaccine  Colonoscopy: done 2016, repeat 2017 - both times poor prep but overall has been sufficient for screening purposes.  1 polyp removed, next colonoscopy in 3 yrs (2020). At that time in 2020, will do 7 days of daily miralax. Seven days low fiber diet.   Two days clear liquids and a split prep with 2 dulcolax before each dose   of golytely.      Review of Systems: ROS  Gen: No F/C   Eye: No diplopia, vision loss  Ears/Nose/Throat: No tinnitus or hearing loss  Cardiovascular: No CP, syncope  Respiratory: No cough, dyspnea  Gastrointestinal: No N/V/D/C, abd pain  Genitourinary: No dysuria, hematuria, urinary freq  Musculoskeletal:  No back pain, jt pain or swelling  Skin: + Healing rash, susp lesions  Neurologic: No weakness, HA  Endocrine: No polydipsia/uria, wt change  Heme/Lymphatic: No abnormal bruising, bleeding, enlarged lymph nodes  All other systems unremarkable        PROBLEMS:   PAST MEDICAL HISTORY (prior to today's visit):  SHINGLES (ICD-053.9) (ICD10-B02.9)  MAMMOGRAPHIC SCREENING FOR BREAST CANCER (ICD-V76.12) (ICD10-Z12.31)  DIABETES MELLITUS, TYPE II (ICD-250.00) (ICD10-E11.9)  LONG-TERM (CURRENT) USE OF STEROIDS (ICD-V58.65) (ICD10-Z79.51)  OSTEOPOROSIS SCREENING (ICD-V82.81) (ICD10-Z13.820)  TRANSAMINASES, SERUM, ELEVATED (ICD-790.4) (ICD10-R74.0)  OBESITY (ICD-278.00) (ICD10-E66.9)      DIABETES MELLITUS, TYPE II, UNCONTROLLED (ICD-250.02) (ICD10-E11.65)      FATTY LIVER DISEASE (ICD-571.8) (ICD10-K76.0)  ? 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)  CARPAL TUNNEL (ICD-354.0) (ICD10-G56.00)  OSTEOARTHRITIS (ICD-715.09) (ICD10-M15.9)  S/P ROTATOR CUFF SURGERY 12/2004 - RT SHOULDER; 2008 LT (ICD-V45.89)  Family Hx of MELANOMA, FAMILY HX (ICD-V16.8) (ICD10-Z80.8)  FRACTURE OF OTHER SPEC SITE,  PATHOLOGIC - MULTIPLE (ICD-733.19)  CHOLECYSTECTOMY AND HERNIA REPAIR (ICD-V45.89)  VITAMIN D DEFICIENCY (ICD-268.9) (ICD10-E55.9)  VACCINATION WITH TDAP (ICD-V06.8) ([id number])  COLONOSCOPY (ICD-V76.51) (ICD10-Z01.89)         MEDICATIONS:   PAST MEDICINES (prior to today's visit):  PREDNISONE 10 MG ORAL TABLET (PREDNISONE) TK 1 T PO  ONCE Daily  LEVOTHYROXINE SODIUM 25 MCG ORAL TABLET (LEVOTHYROXINE SODIUM) Take one tablet by mouth once daily  OMEPRAZOLE 20 MG ORAL CAPSULE DELAYED RELEASE (OMEPRAZOLE) Take one capsule by mouth twice a day  IBUPROFEN 600 MG ORAL TABLET (IBUPROFEN) 1 tab by mouth TID as needed for pain  BACLOFEN 10 MG ORAL TABLET (BACLOFEN) Please take half to one tablet as needed for back spasms  DICLOFENAC SODIUM 1 % TRANSDERMAL GEL (DICLOFENAC SODIUM) APP 2 GRAMS EXT AA BID      FREESTYLE FREEDOM LITE w/Device KIT (BLOOD GLUCOSE MONITORING SUPPL) use as directed (ICD 10 E11.65)      FREESTYLE LITE TEST IN VITRO STRIP (GLUCOSE BLOOD) check fingerstick three times a day (ICD 10 E11.65)      FREESTYLE LANCETS (LANCETS) check fingerstick three times a day (ICD 10 E11.65)  GLUCOPHAGE XR 500 MG ORAL TABLET EXTENDED RELEASE 24 HOUR (METFORMIN HCL) take four tablets by mouth daily every morning  ROSUVASTATIN CALCIUM 10 MG ORAL TABLET (ROSUVASTATIN CALCIUM)  take one tablet by mouth daily  GLIPIZIDE ER 5 MG ORAL TABLET EXTENDED RELEASE 24 HOUR (GLIPIZIDE) take 1 tablet by mouth daily  TRAMADOL HCL 50 MG ORAL TABLET (TRAMADOL HCL) take one tablet daily as needed for pain  OXYCODONE HCL 5 MG ORAL TABLET (OXYCODONE HCL) Partial fill upon patient request.  Take one tablet daily as needed for pain >8  CALCIUM CARBONATE-VITAMIN D 600-200 MG-UNIT ORAL TABLET (CALCIUM CARBONATE-VITAMIN D) take one tablet twice a day  VALACYCLOVIR HCL 1 GM ORAL TABLET (VALACYCLOVIR HCL) one tablet by mouth twice daily  HUMIRA 40 MG/0.4ML SUBCUTANEOUS PREFILLED SYRINGE KIT (ADALIMUMAB)   GABAPENTIN 300 MG ORAL CAPSULE (GABAPENTIN) one capsule by mouth at bedtime as needed for pain  HYDROXYCHLOROQUINE SULFATE 200 MG ORAL TABLET (HYDROXYCHLOROQUINE SULFATE) one tablet oral daily           ALLERGIES:  DIRECTIVES:         Vitals:   BP: 106 / 72 mmHg Ht: 62.5 in.  Wt: 211 lbs.  BMI (in-lb) 38.11  Temp: 97.9deg F.   Pulse Rate: 87 bpm      Additional PE: Gen: Well developed, well nourished, in NAD  Skin: Well healing with minimal scabbing lesions along right collar bone and extending across the right neck. One more erythematous lesion on the right upper ear. No pustulance, no oozing, no signs of infection.   Shoulder: Full ROM without pain, no asymmetry between shoulders, no pain to palpation.      Assessment & Plan:   Ms Kniskern is a 56 year old woman who presents for shingles follow up.    # Shingles - healing  - Provided information on warning signs for secondary bacterial infection of the lesions and to return to clinic immediately if this occurs.   - Wrote 'return to work' note and provided it to the patient  - Recommended continuing NSAIDs for pain  - Also provided guidance on warning signs for shoulder infection    # Type 2 DM  - Got an A1c today - 8.7%  - Down from 10.2% last    RTC PRN        * Resident Signature (.sign): ......................................Marland KitchenHeron Sabins, MD (DBM)   October 05, 2016 8:53 AM        ORDERS:  Est Level 3 [[id number]-95621]      **Preceptor Note**     Resident: Heron Sabins)  [facility]      Subjective   Patient is a 56 Years Old Female who presents to clinic shingles follow up. all scabbed over and feels well   I discussed the patient with Dr. Kinnie Scales Judeth Cornfield) in a routine precepting encounter.  I also personally interviewed and examined the patient.  I agree with the patient's diagnosis and management.       Objective   gen-nad  right anterior chest wall with scabbed, dry lesions  see resident's note for details.      Assessment   shingles--resolving well. ok to go back to work    Plan   Follow-up per resident note    Seen On Team (Floor):  5        Patient Care Plan        Immunization Worksheet 2016           Created By Shan Levans on 10/05/2016 at 08:13 AM    Electronically Signed By Kyra Leyland MD on 10/06/2016 at 03:19 PM

## 2016-10-09 ENCOUNTER — Ambulatory Visit

## 2016-10-09 MED ORDER — LANCETS: 100 | 1 refills | 0 days | Status: AC

## 2016-10-12 ENCOUNTER — Ambulatory Visit

## 2016-10-12 MED ORDER — LANCETS: 100 | 1 refills | 0 days | Status: AC

## 2016-10-20 ENCOUNTER — Ambulatory Visit (HOSPITAL_BASED_OUTPATIENT_CLINIC_OR_DEPARTMENT_OTHER)

## 2016-10-20 ENCOUNTER — Ambulatory Visit

## 2016-10-20 ENCOUNTER — Ambulatory Visit: Admitting: Ophthalmology

## 2016-10-20 ENCOUNTER — Ambulatory Visit: Admitting: Rheumatology

## 2016-10-20 ENCOUNTER — Ambulatory Visit: Admit: 2016-10-20 | Payer: No Typology Code available for payment source

## 2016-10-20 ENCOUNTER — Ambulatory Visit: Admit: 2016-10-20 | Payer: 59

## 2016-10-20 LAB — HX HEM-ROUTINE
HX BASO #: 0 10*3/uL (ref 0.0–0.2)
HX BASO: 0 %
HX EOSIN #: 0 10*3/uL (ref 0.0–0.5)
HX EOSIN: 0 %
HX HCT: 44.1 % (ref 32.0–45.0)
HX HGB: 14.2 g/dL (ref 11.0–15.0)
HX IMMATURE GRANULOCYTE#: 0 10*3/uL (ref 0.0–0.1)
HX IMMATURE GRANULOCYTE: 0 %
HX LYMPH #: 1.3 10*3/uL (ref 1.0–4.0)
HX LYMPH: 11 %
HX MCH: 30.3 pg (ref 26.0–34.0)
HX MCHC: 32.2 g/dL (ref 32.0–36.0)
HX MCV: 94 fL (ref 80.0–98.0)
HX MONO #: 0.5 10*3/uL (ref 0.2–0.8)
HX MONO: 4 %
HX MPV: 10.7 fL (ref 9.1–11.7)
HX NEUT #: 10.6 10*3/uL — ABNORMAL HIGH (ref 1.5–7.5)
HX PLT: 230 10*3/uL (ref 150–400)
HX RBC BLOOD COUNT: 4.69 M/uL (ref 3.70–5.00)
HX RDW: 13.7 % (ref 11.5–14.5)
HX SEG NEUT: 85 %
HX WBC: 12.6 10*3/uL — ABNORMAL HIGH (ref 4.0–11.0)

## 2016-10-20 LAB — HX CHEM-LFT
HX ALANINE AMINOTRANSFERASE (ALT/SGPT): 168 IU/L — ABNORMAL HIGH (ref 0–54)
HX ALKALINE PHOSPHATASE (ALK): 324 IU/L — ABNORMAL HIGH (ref 40–130)
HX ASPARTATE AMINOTRANFERASE (AST/SGOT): 97 IU/L — ABNORMAL HIGH (ref 10–42)
HX BILIRUBIN, TOTAL: 1 mg/dL (ref 0.2–1.1)

## 2016-10-20 LAB — HX CHEM-PANELS: HX BLOOD UREA NITROGEN: 19 mg/dL (ref 6–24)

## 2016-10-20 MED ORDER — GLIPIZIDE: 1 | 30 | 5 refills | 0 days

## 2016-10-20 NOTE — Progress Notes (Signed)
.  Progress Notes  .  Patient: [name], [name]  Provider: [name]  DOB: 1960/05/04 Age: 56 Y Sex: Female  .  PCP: [name]  MD  Date: 10/20/2016  .  --------------------------------------------------------------------------------  .  REASON FOR APPOINTMENT  .  1. scleroderma / inflammatory arthritis FU  .  2. Recent shingles  .  HISTORY OF PRESENT ILLNESS  .  GENERAL:   She recently had shingles and still has discomfort behind her  right ear. The pain and swelling in her hands is bothersome but  she will tolerate to not go back on Humira. Except occasional  knee discomfort, her achilles, and ankle joint, no other joints  are bothering her. The Enbrel only helped for a couple of days.  I, Coletta Memos, acted as Neurosurgeon for [name]. Signature  below:Shelagh Azzie Almas 10/20/16.  [name]  CURRENT MEDICATIONS  .  Taking Baclofen 10 MG Tablet 1/2 tab Orally prn  Taking Calcium Carbonate-Vitamin D 600-200 MG-UNIT Capsule 1  capsule with a meal Orally Twice a day  Taking GlipiZIDE XL 5 MG Tablet Extended Release 24 Hour 1 tablet  Orally Once a day  Taking Glucophage XR 500 mg Tablet Extended Release 24 Hour 4  tablets Orally Once a day  Taking Humira Pen 40 MG/0.8ML Pen-injector Kit 0.8 ml  Subcutaneous every 2 weeks  Taking Hydroxychloroquine Sulfate 200 mg Tablet 1 tablet with  food or milk Orally Once a day  Taking Levothyroxine Sodium 25 MCG Tablet 1 tablet Orally Once a  day  Taking Omeprazole 40 mg Capsule Delayed Release 1 capsule Orally  Once a day  Taking PredniSONE 5 mg Tablet 1 tablet Orally Once a day  Taking Ranitidine HCl 150 mg Capsule 1 capsule at bedtime Orally  Once a day  Taking Rosuvastatin Calcium 10 MG Tablet 1 tablet Orally Once a  day  Taking Tramadol HCl 50 MG Tablet 1 tablet Orally every 6 hrs  Not-Taking/PRN Voltaren 1 % Gel 2 grams Transdermal twice daily,  Notes: PRN  .  PAST MEDICAL HISTORY  .  Scleroderma - CREST dx 2007  IgG4 deficiency s/p IVIG 2010 ----single infusion  given  preventively after week of bilateral knee replacements at [facility]  2010  Fx left wrist in 1994  Blood clot at LUE in 2006 on a short course of Coumadin  Bilateral carpal tunnel syndrome s/p Lt carpal tunnel release and  steroids injection right--  Bone spur left foot  Scoliosis  Spinal stenosis s/p steroids injection  s/p bilateral knee replacements for valgus /arthritic  complications, performed by Dr. Katrinka Blazing 2010--- never infected, but  packed with antibiotics with surgery;  Right rotator cuff repairs x 4, complicated by repeat tears,  infection, placement of anchor material  Elevated liver function tests  Diabetes  .  ALLERGIES  .  N.K.D.A.  .  SOCIAL HISTORY  .  .  Tobacco  history: Never smoked.  .  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  .  Alcohol Former daily EtOH use in 20s.  .  Nonsmoker.Lives with longstanding boyfriend.  [name]  REVIEW OF SYSTEMS  .  ADULT Rheumatology ROS :  .  Constitutional    No Recent weight gain, Fatigue, Chills . Eyes     No Pain, Loss of vision, Itching eyes . HENT    No Headache  Ringing in ears Loss of hearing . Respiratory    No Shortness of  breath, Cough, Hemoptysis . Gastrointestinal  No Nausea  Heartburn Diarrhea . Genitourinary    No Difficult urination, Pus  in urine, Blood in urine . Musculoskeletal    +joint pain/  swelling , +Joint pain/ Swelling, +AM stiffness lasting 30 min  .  Integumentary (skin and/or breast)    No Easy bruising, Rash, Sun  sensitive (sun allergy) . Neurological System    +Numbness or  Tingling  . Psychiatric    No Excessive worries, Depression,  Night sweats . Endocrine    No Excessive thirst .  Hematologic/Lymphatic    No Swollen glands, Anemia,  Lymphadenopathy . Allergic/Immunologic    No Frequent sneezing,  Increased susceptibility to infection, Raynauds . Heart    No  Chest Pain, Heart murmur, Irregular heart beat . Cardiovascular  System    +Swelling/ whitening of hands/feet in cold  .  [name]  VITAL SIGNS  .  Pain scale 4, Ht-in 62,  Wt-lbs 211, BMI 38.59, BP 141/55, HR 89.  [name]  EXAMINATION  .  RAPID 3: Function(0-10): 0.7. Pain(0-10):4.5.  Patient Global(0-10):0.  [name]  Rheumatology:  General AppearanceAlert and oriented , No apparent distress .  HENT:No, alopecia .  Eyes:No, scleral icterus, scleral erythema .  ZO:XWRUEAV rate rhythm .  WUJ:WJXBJ LE edema .  Skin:No, rash .  Psych:No anxiety depression.  MuskuloskeletalElbows, shoulders, hips, knees have intact ROM  with no synovitis. Hands have synovitis in the PIPs and MCPs  bilaterally..  Skin and NailsMinimal sclerodactyly.  synovitis in MCPs and PIPs bilaterally.  .  ASSESSMENTS  .  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  She had shingles on TNF inhibition. Her treatment is complicated  by the fact that she has IgG4 deficiency and elevated LFTs for  unclear reasons. She prefers to remain off biologic therapy for  now which is OK in the short term. One option would be to try  IVIG. Another would be to try benlysta. For now she will continue  low dose steroids. Will check labs today.  .  TREATMENT  .  Scleroderma  LAB: Alkaline Phosphatase (ALK)  Alkaline Phosphatase (ALK)     324     (40 - 130 - IU/L)  .  [name]  LAB: Bilirubin, Total (TBIL)  Bilirubin, Total     1.0     (0.2 - 1.1 - mg/dL)  .  [name]  LAB: Aspartate aminotransferase (AST)  Aspartate Aminotranferase (AST/SGOT)     97     (10 - 42 - IU/L)  .  [name]  LAB: Alanine aminotransferase (ALT)  Alanine Aminotransferase (ALT/SGPT)     168     (0 - 54 - IU/L)  .  [name]  LAB: Blood Urea Nitrogen (BUN)  Blood Urea Nitrogen     19     (6 - 24 - mg/dL)  .  [name]  LAB: Antinuclear Antibody-ANA  ANA Pattern     Centromere     ( - )  ANA Titer     1:2560     ( - )  .  [name]  LAB: Anti DS DNA (ADNA)  Anti DS DNA     3.7     (0.0 - 25.0 - EU)  .  [name]  LAB: C3 Complement  C3 Complement     156.1     (82.0 - 193.0 - mg/dL)  .  [name]  LAB: C4 Complement  C4 Complement  15.6     (12.0 - 57.0 - mg/dL)  .  [name]  LAB:  Ext Nclr Antg(Ena) RNP  Ext Nclr Antg(Ena) RNP     <1.0     (0.0 - 20.0 - EU)  .  [name]  LAB: CBC/DIFF with PLT (CBCWD)  WBC     12.6     (4.0 - 11.0 - K/uL)  RBC     4.69     (3.70 - 5.00 - M/uL)  HGB     14.2     (11.0 - 15.0 - g/dL)  HCT     09.8     (11.9 - 45.0 - %)  MCV     94.0     (80.0 - 98.0 - fL)  MCH     30.3     (26.0 - 34.0 - pg)  MCHC     32.2     (32.0 - 36.0 - g/dL)  RDW     14.7     (82.9 - 14.5 - %)  PLT     230     (150 - 400 - K/uL)  MPV     10.7     (9.1 - 11.7 - fL)  SEG NEUT     85     ( - %)  LYMPH     11     ( - %)  MONO     4     ( - %)  EOS     0     ( - %)  BASO     0     ( - %)  NEUT #     10.6     (1.5 - 7.5 - K/uL)  LYMPH #     1.3     (1.0 - 4.0 - K/uL)  MONO #     0.5     (0.2 - 0.8 - K/uL)  EOSIN #     0.0     (0.0 - 0.5 - K/uL)  BASO #     0.0     (0.0 - 0.2 - K/uL)  Imm Grnas     0     ( - %)  Imm Grans, Abs     0.0     (0.0 - 0.1 - K/uL)  .  [name]  LAB: Quantiferon TB Gold (QFER)  Quantiferon     NEGATIVE     (Negative - )  Quantiferon-Nil     0.03     ( - IU/mL)  Quantiferon Mitogen-Nil     3.63     ( - IU/mL)  Quantiferon TB Ag-Nil     0.00     ( - IU/mL)  .  FOLLOW UP  .  3 Months  .  Electronically signed by Erasmo Leventhal , MD on  11/05/2016 at 10:29 PM EDT  .  Document electronically signed by [name]

## 2016-10-20 NOTE — Progress Notes (Signed)
* * *        [name]**    --- ---    75 Y old Female, DOB: [birth year], External MRN: [id number]    Account Number: [id number]    [address], [id number]    Home: [phone]    Guarantor: [name] Insurance: [place]P [place]    PCP: [name], MD Referring: Fortunato Curling, MD    Appointment Facility: Rheumatology, Allergy and Immunology        * * *    10/20/2016  Progress Notes: [name], MD **CHN#:** [id number]    --- ---    ---        Reason for Appointment    ---      1\. scleroderma / inflammatory arthritis FU    ---    2\. Recent shingles    ---      History of Present Illness    ---     _GENERAL_ :    She recently had shingles and still has discomfort behind her right ear.    The pain and swelling in her hands is bothersome but she will tolerate to not  go back on Humira. Except occasional knee discomfort, her achilles, and ankle  joint, no other joints are bothering her.    The Enbrel only helped for a couple of days.    I, Coletta Memos, acted as Neurosurgeon for [name]. Signature below:    Coletta Memos, Scribe 10/20/16.      Current Medications    ---    Taking     * Baclofen 10 MG Tablet 1/2 tab Orally prn    ---    * Calcium Carbonate-Vitamin D 600-200 MG-UNIT Capsule 1 capsule with a meal Orally Twice a day    ---    * GlipiZIDE XL 5 MG Tablet Extended Release 24 Hour 1 tablet Orally Once a day    ---    * Glucophage XR 500 mg Tablet Extended Release 24 Hour 4 tablets Orally Once a day    ---    * Humira Pen 40 MG/0.8ML Pen-injector Kit 0.8 ml Subcutaneous every 2 weeks    ---    * Hydroxychloroquine Sulfate 200 mg Tablet 1 tablet with food or milk Orally Once a day    ---    * Levothyroxine Sodium 25 MCG Tablet 1 tablet Orally Once a day    ---    * Omeprazole 40 mg Capsule Delayed Release 1 capsule Orally Once a day    ---    * PredniSONE 5 mg Tablet 1 tablet Orally Once a day    ---    * Ranitidine HCl 150 mg Capsule 1 capsule at bedtime Orally Once a day    ---    *  Rosuvastatin Calcium 10 MG Tablet 1 tablet Orally Once a day    ---    * Tramadol HCl 50 MG Tablet 1 tablet Orally every 6 hrs    ---    Not-Taking/PRN    * Voltaren 1 % Gel 2 grams Transdermal twice daily, Notes: PRN    ---      Past Medical History    ---       Scleroderma - CREST dx 2007.        ---    IgG4 deficiency s/p IVIG 2010 ----single infusion given preventively after  week of bilateral knee replacements at [facility] 2010.        ---  Fx left wrist in 1994.        ---    Blood clot at LUE in 2006 on a short course of Coumadin.        ---    Bilateral carpal tunnel syndrome s/p Lt carpal tunnel release and steroids  injection right--.        ---    Bone spur left foot.        ---    Scoliosis.        ---    Spinal stenosis s/p steroids injection.        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 Katrinka Blazing 2010--- never infected, but packed with antibiotics with  surgery;.        ---    Right rotator cuff repairs x 4, complicated by repeat tears, infection,  placement of anchor material.        ---    Elevated liver function tests.        ---    Diabetes.        ---      Social History    ---    Tobacco history: Never smoked.    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    Alcohol  Former daily EtOH use in 20s.   Nonsmoker.    Lives with longstanding boyfriend.    ---      Allergies    ---      N.K.D.A.    ---      Review of Systems    ---     _ADULT Rheumatology ROS_ :    Constitutional No Recent weight gain, Fatigue, Chills. Eyes No Pain, Loss of  vision, Itching eyes. HENT No Headache Ringing in ears Loss of hearing.  Respiratory No Shortness of breath, Cough, Hemoptysis. Gastrointestinal No  Nausea Heartburn Diarrhea. Genitourinary No Difficult urination, Pus in urine,  Blood in urine. Musculoskeletal **+joint pain/ swelling** , **+Joint pain/  Swelling, +AM stiffness lasting 30 min**. Integumentary (skin and/or breast)  No Easy bruising, Rash, Sun  sensitive (sun allergy). Neurological System  **+Numbness or Tingling** . Psychiatric No Excessive worries, Depression,  Night sweats. Endocrine No Excessive thirst. Hematologic/Lymphatic No Swollen  glands, Anemia, Lymphadenopathy. Allergic/Immunologic No Frequent sneezing,  Increased susceptibility to infection, Raynauds. Heart No Chest Pain, Heart  murmur, Irregular heart beat. Cardiovascular System **+Swelling/ whitening of  hands/feet in cold** .          Vital Signs    ---    Pain scale 4, Ht-in 62, Wt-lbs 211, BMI 38.59, BP 141/55, HR 89.      Examination    ---     _RAPID 3_ :    Function (0-10): 0.7.        Pain (0-10): 4.5.        Patient Global (0-10): 0.    _Rheumatology_ :    General Appearance Alert and oriented , No apparent distress .    HENT: No, alopecia .    Eyes: No, scleral icterus, scleral erythema .    CV: regular rate rhythm .    Ext: trace LE edema .    Skin: No, rash .    Psych: No anxiety depression.    Muskuloskeletal Elbows, shoulders, hips, knees have intact ROM with no  synovitis. Hands have synovitis in the PIPs and MCPs bilaterally..    Skin and Nails Minimal sclerodactyly.    synovitis in MCPs and PIPs  bilaterally.          Assessments    ---    1\. Scleroderma - M34.9 (Primary)    ---    2\. 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 Her treatment is complicated by the fact  that she has IgG4 deficiency and elevated LFTs for unclear reasons. She  prefers to remain off biologic therapy for now which is OK in the short term.  One option would be to try IVIG. Another would be to try benlysta. For now she  will continue low dose steroids. Will check labs today.    ---      Treatment    ---       **1\. Scleroderma**    _LAB: Alkaline Phosphatase (ALK)_   Alkaline Phosphatase (ALK)  324  H  40 -  130 - IU/L    --- --- --- ---    _LAB: Bilirubin, Total (TBIL)_ Bilirubin, Total  1.0    0.2 - 1.1 - mg/dL     --- --- --- ---    _LAB: Aspartate aminotransferase (AST)_ Aspartate Aminotranferase (AST/SGOT)   97  H  10 - 42 - IU/L    --- --- --- ---    _LAB: Alanine aminotransferase (ALT)_ Alanine Aminotransferase (ALT/SGPT)   168  H  0 - 54 - IU/L    --- --- --- ---    _LAB: Blood Urea Nitrogen (BUN)_ Blood Urea Nitrogen  19    6 - 24 - mg/dL    --- --- --- ---    _LAB: Antinuclear Antibody-ANA_ ANA Pattern  Centromere  A   \-    --- --- --- ---    ANA Titer  1:2560  A   \-    --- --- --- ---    _LAB: Anti DS DNA (ADNA)_ Anti DS DNA  3.7    0.0 - 25.0 - EU    --- --- --- ---    _LAB: C3 Complement_ C3 Complement  156.1    82.0 - 193.0 - mg/dL    --- --- --- ---    _LAB: C4 Complement_ C4 Complement  15.6    12.0 - 57.0 - mg/dL    --- --- --- ---    _LAB: Ext Nclr Antg(Ena) RNP_ Ext Nclr Antg(Ena) RNP  <1.0    0.0 - 20.0 - EU    --- --- --- ---    _LAB: CBC/DIFF with PLT (CBCWD)_ WBC  12.6  H  4.0 - 11.0 - K/uL    --- --- --- ---    RBC  4.69    3.70 - 5.00 - M/uL    --- --- --- ---    HGB  14.2    11.0 - 15.0 - g/dL    --- --- --- ---    HCT  44.1    32.0 - 45.0 - %    --- --- --- ---    MCV  94.0    80.0 - 98.0 - fL    --- --- --- ---    MCH  30.3    26.0 - 34.0 - pg    --- --- --- ---    MCHC  32.2    32.0 - 36.0 - g/dL    --- --- --- ---    RDW  13.7    11.5 -  14.5 - %    --- --- --- ---    PLT  230    150 - 400 - K/uL    --- --- --- ---    MPV  10.7    9.1 - 11.7 - fL    --- --- --- ---    SEG NEUT  85     \- %    --- --- --- ---    LYMPH  11     \- %    --- --- --- ---    MONO  4     \- %    --- --- --- ---    EOS  0     \- %    --- --- --- ---    BASO  0     \- %    --- --- --- ---    NEUT #  10.6  H  1.5 - 7.5 - K/uL    --- --- --- ---    LYMPH #  1.3    1.0 - 4.0 - K/uL    --- --- --- ---    MONO #  0.5    0.2 - 0.8 - K/uL    --- --- --- ---    EOSIN #  0.0    0.0 - 0.5 - K/uL    --- --- --- ---    BASO #  0.0    0.0 - 0.2 - K/uL    --- --- --- ---    Imm Grnas  0     \- %    --- --- --- ---    Imm Grans, Abs   0.0    0.0 - 0.1 - K/uL    --- --- --- ---    _LAB: Quantiferon TB Gold (QFER)_ Quantiferon  NEGATIVE    Negative -    --- --- --- ---    Quantiferon-Nil  0.03     \- IU/mL    --- --- --- ---    Quantiferon Mitogen-Nil  3.63     \- IU/mL    --- --- --- ---    Quantiferon TB Ag-Nil  0.00     \- IU/mL    --- --- --- ---      Follow Up    ---    3 Months    Electronically signed by Erasmo Leventhal , MD on 11/05/2016 at 10:29 PM EDT    Sign off status: Completed        * * *        Rheumatology, Allergy and Immunology    [address]    [place], [place] [address]    Tel: [phone]    Fax: [phone]              * * *          Patient: [name], [name] DOB: [birth year] Progress Note: [name], MD  10/20/2016    ---    Note generated by eClinicalWorks EMR/PM Software ([email or web address])

## 2016-10-26 LAB — HX IMMUNOLOGY
HX ANA TITER: 1:2560 {titer} — AB
HX ANTI DS DNA: 3.7 EU (ref 0.0–25.0)
HX ANTI NUCLEAR ANTIBODY SCREEN: REACTIVE — AB
HX C3 COMPLEMENT: 156.1 mg/dL (ref 82.0–193.0)
HX C4 COMPLEMENT: 15.6 mg/dL (ref 12.0–57.0)
HX EXT NCLR ANTG(ENA) RNP: <1 EU (ref 0.0–20.0)
HX QUANTIFERON MITOGEN-NIL: 3.63 [IU]/mL
HX QUANTIFERON TB AG-NIL: 0 [IU]/mL
HX QUANTIFERON-NIL: 0.03 [IU]/mL
HX QUANTIFERON: NEGATIVE

## 2016-11-03 ENCOUNTER — Ambulatory Visit: Admitting: Rheumatology

## 2016-11-03 ENCOUNTER — Ambulatory Visit

## 2016-11-03 MED ORDER — GABAPENTIN: 1 | 30 | 0 refills | 0 days

## 2016-11-03 MED ORDER — PredniSONE: 5 | 60 | Freq: Every day | ORAL | 0 refills | 0 days | Status: AC

## 2016-11-03 NOTE — Progress Notes (Signed)
* * *        **  [name]**    --- ---    74 Y old Female, DOB: 09/21/1960    [address], [place], [place] [address]    Home: [phone]    Provider: [name]        * * *    Telephone Encounter    ---    Answered by   Donzetta Starch  Date: 11/03/2016         Time: 03:28 PM    Reason   refill    --- ---            Message                      Fyi refill sent.                Action Taken   Gunter-Turner,Kymia 11/03/2016 3:31:28 PM >            Refills  Refill PredniSONE Tablet, 5 mg, Orally, 60, 2 tablet, Once a day, 30    --- ---          * * *                ---          * * *          PatientDenny [name], [name] DOB: 12-10-1960 Provider: [name] 11/03/2016    ---    Note generated by eClinicalWorks EMR/PM Software ([email or web address])

## 2016-11-03 NOTE — Telephone Encounter (Signed)
Prescriptions:  GABAPENTIN 300 MG ORAL CAPSULE (GABAPENTIN) one capsule by mouth at bedtime as needed for pain  #30[Unspecified] x 0   Entered by: Rowan Blase, RN   Authorized by: [name], MD   Signed by: Rowan Blase, RN on 11/03/2016   Method used: Electronically to      [facility] [address]* (retail)     [address]     [place], [place]  [id number]     Ph: [phone]     Fax: [phone]   RxID: [id number]      RESPONSE/ORDERS:    requesting refill of gabapentin to [facility] [address] [place] ......................................Marland KitchenJeanann Lewandowsky RN  November 03, 2016 12:52 PM  please advise if ok to refill ......................................Marland KitchenJeanann Lewandowsky RN  November 03, 2016 12:53 PM    yes that's okay to refill, thanks  .......................................[name], MD  November 03, 2016 1:12 PM    done. ......................................Marland KitchenRowan Blase, RN  November 03, 2016 2:20 PM           ORDERS/PROBS/MEDS/ALL     Problems:   SHINGLES (ICD-053.9) (ICD10-B02.9)  MAMMOGRAPHIC SCREENING FOR BREAST CANCER (ICD-V76.12) (ICD10-Z12.31)  DIABETES MELLITUS, TYPE II (ICD-250.00) (ICD10-E11.9)  LONG-TERM (CURRENT) USE OF STEROIDS (ICD-V58.65) (ICD10-Z79.51)  OSTEOPOROSIS SCREENING (ICD-V82.81) (ICD10-Z13.820)  TRANSAMINASES, SERUM, ELEVATED (ICD-790.4) (ICD10-R74.0)  OBESITY (ICD-278.00) (ICD10-E66.9)      DIABETES MELLITUS, TYPE II, UNCONTROLLED (ICD-250.02) (ICD10-E11.65)      FATTY LIVER DISEASE (ICD-571.8) (ICD10-K76.0)  ? 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)  (ICD10-D14.30)  CARPAL TUNNEL (ICD-354.0) (ICD10-G56.00)  OSTEOARTHRITIS (ICD-715.09) (ICD10-M15.9)  S/P ROTATOR CUFF SURGERY 12/2004 - RT SHOULDER; 2008 LT (ICD-V45.89)  Family Hx of MELANOMA, FAMILY HX (ICD-V16.8) (ICD10-Z80.8)  FRACTURE OF OTHER SPEC SITE,  PATHOLOGIC - MULTIPLE (ICD-733.19)  CHOLECYSTECTOMY AND HERNIA REPAIR (ICD-V45.89)  VITAMIN D DEFICIENCY (ICD-268.9) (ICD10-E55.9)  VACCINATION WITH TDAP (ICD-V06.8) ([id number])  COLONOSCOPY (ICD-V76.51) (ICD10-Z01.89)    Meds (prior to this call):   PREDNISONE 10 MG ORAL TABLET (PREDNISONE) TK 1 T PO  ONCE Daily  LEVOTHYROXINE SODIUM 25 MCG ORAL TABLET (LEVOTHYROXINE SODIUM) Take one tablet by mouth once daily  OMEPRAZOLE 20 MG ORAL CAPSULE DELAYED RELEASE (OMEPRAZOLE) Take one capsule by mouth twice a day  IBUPROFEN 600 MG ORAL TABLET (IBUPROFEN) 1 tab by mouth TID as needed for pain  BACLOFEN 10 MG ORAL TABLET (BACLOFEN) Please take half to one tablet as needed for back spasms  DICLOFENAC SODIUM 1 % TRANSDERMAL GEL (DICLOFENAC SODIUM) APP 2 GRAMS EXT AA BID      FREESTYLE FREEDOM LITE w/Device KIT (BLOOD GLUCOSE MONITORING SUPPL) use as directed (ICD 10 E11.65)      FREESTYLE LITE TEST IN VITRO STRIP (GLUCOSE BLOOD) check fingerstick three times a day (ICD 10 E11.65)      FREESTYLE LANCETS (LANCETS) check fingerstick three times a day (ICD 10 E11.65)  GLUCOPHAGE XR 500 MG ORAL TABLET EXTENDED RELEASE 24 HOUR (METFORMIN HCL) take four tablets by mouth daily every morning  ROSUVASTATIN CALCIUM 10 MG ORAL TABLET (ROSUVASTATIN CALCIUM) take one tablet by mouth daily  GLIPIZIDE ER 5 MG ORAL TABLET EXTENDED RELEASE 24 HOUR (GLIPIZIDE) take 1 tablet by mouth daily  TRAMADOL HCL 50 MG ORAL TABLET (TRAMADOL HCL) take one tablet daily as needed for pain  OXYCODONE HCL 5 MG ORAL TABLET (OXYCODONE HCL) Partial fill upon patient request.  Take one tablet daily as needed for pain >8  CALCIUM CARBONATE-VITAMIN D 600-200 MG-UNIT ORAL TABLET (CALCIUM CARBONATE-VITAMIN D)  take one tablet twice a day  VALACYCLOVIR HCL 1 GM ORAL TABLET (VALACYCLOVIR HCL) one tablet by mouth twice daily  HUMIRA 40 MG/0.4ML SUBCUTANEOUS PREFILLED SYRINGE KIT (ADALIMUMAB)   GABAPENTIN 300 MG ORAL CAPSULE (GABAPENTIN) one capsule by mouth at bedtime as needed for pain  HYDROXYCHLOROQUINE SULFATE 200 MG ORAL TABLET (HYDROXYCHLOROQUINE SULFATE) one tablet oral daily    Changes to Meds (this update):   Rx of GABAPENTIN 300 MG ORAL CAPSULE (GABAPENTIN) one capsule by mouth at bedtime as needed for pain;  #30[Unspecified] x 0;  Signed;  Entered by: Rowan Blase, RN;  Authorized by: [name], MD;  Method used: Electronically to [facility]*, [address], [place], [place]  [id number], Ph: [phone], Fax: [phone]        Created By Jeanann Lewandowsky RN on 11/03/2016 at 12:52 PM    Electronically Signed By Rowan Blase RN on 11/03/2016 at 02:20 PM

## 2016-11-14 ENCOUNTER — Ambulatory Visit

## 2016-11-14 NOTE — Progress Notes (Signed)
[facility] November 14, 2016  [address]   [place] [address]  Main: [phone]  Fax: [phone]  Patient Portal: [name]       MR#: [id number]  [address]  [place], [place]  [address]       DOB: 1960/06/06      Dear  Ms. [name],      We are sorry to see that you have been unable to be seen at [facility] recently (formerly [facility]). Your health is important to [place]. Please call the office as soon as it is convenient so that our staff may schedule your follow up appointment.      Sincerely,     Your Care Team at [facility]  [phone]    ** Please be aware of our new extended hours to better care for you. Hours are: Monday through Thursday from 8 am to 8 pm; Friday from 8 am to 5 pm; Saturday from 8 am to 12 noon.    We do appreciate at least 24 hours notice to cancel an appointment. If this letter is in error, we do apologize.        Created By [name], MD on 11/14/2016 at 01:33 PM    Electronically Signed By [name], MD on 11/14/2016 at 01:34 PM

## 2016-12-08 ENCOUNTER — Ambulatory Visit

## 2016-12-08 ENCOUNTER — Ambulatory Visit: Admitting: Ophthalmology

## 2016-12-11 ENCOUNTER — Ambulatory Visit

## 2016-12-11 MED ORDER — BACLOFEN: 0.5 | 30 | 0 refills | 0 days

## 2016-12-13 ENCOUNTER — Ambulatory Visit

## 2016-12-13 NOTE — Telephone Encounter (Signed)
PHONE NOTE   Phone call placed to patient      Phone Call: Need to make an appointment     Call Information   *Call Reason: Diabetes    Call Action   *Spoke with Patient: LVM (Left Voice Message)     *Outcome of Call: Did not reach patient    Other Call Details     (Scripting)   Hello. I am calling you from Primary Care at [facility]. How are you today?    Your doctor wanted to schedule a follow-up appointment to review your care.    When are you available to come in?    [name]..if asked why you called, or reason for appoinment...    Your Diabetes is out of control. It is important for your health to see your team and decrease your blood sugars.          Created By Orbie Pyo on 12/13/2016 at 10:18 AM    Electronically Signed By Orbie Pyo on 12/13/2016 at 10:19 AM

## 2016-12-18 ENCOUNTER — Ambulatory Visit: Admitting: Rheumatology

## 2016-12-18 MED ORDER — PredniSONE: 5 | 60 | Freq: Every day | ORAL | 2 refills | 0 days | Status: AC

## 2016-12-18 NOTE — Progress Notes (Signed)
* * *        **  [name]**    --- ---    38 Y old Female, DOB: April 27, 1960    [address] [place], [place] [address]    Home: [phone]    Provider: [name]        * * *    Telephone Encounter    ---    Answered by   Oletha Blend  Date: 12/18/2016         Time: 11:25 AM    Reason   refill    --- ---            Refills  Refill PredniSONE Tablet, 5 mg, Orally, 60, 2 tablet, Once a day, 30,  Refills=2    --- ---          * * *                ---          * * *          PatientDenny [name], [name] DOB: [birth year] Provider: [name] 12/18/2016    ---    Note generated by eClinicalWorks EMR/PM Software ([email or web address])

## 2017-01-04 ENCOUNTER — Ambulatory Visit

## 2017-01-04 NOTE — Telephone Encounter (Signed)
Call Details:   Patient PCP = [name], MD  [name] (Patient) called on January 04, 2017 8:13 AM.  Message taken by: Murlean Caller  Primary call-back number: [phone]    Secondary call-back number: () -    Call Reason(s): Forms and Message/Call-Back      ** REQUESTING ASSISTANCE WITH FORMS.  Form Name:     ** MESSAGE / CALL-BACK.  Regarding: Patient calling to have FMLA form filled out. Patient wants to know if PCP will need her to fax over a copy or is pcp already has a form on file. Patient needs form by 10/22.  Pease give patient a call to verify if form needs to be faxed.     ---------- ---------- ---------- ---------- ---------- ----------       RESPONSE/ORDERS:  i don't have a form for her. she will need to send it to [place]. thanks.......................................[name], MD  January 04, 2017 8:25 AM    spoke with pt and she will be faxing over [facility] form. I will put it in your mailbox ......................................Marland KitchenDupree Bouyer  January 04, 2017 11:29 AM           ORDERS/PROBS/MEDS/ALL     Problems:   SHINGLES (ICD-053.9) (ICD10-B02.9)  MAMMOGRAPHIC SCREENING FOR BREAST CANCER (ICD-V76.12) (ICD10-Z12.31)  DIABETES MELLITUS, TYPE II (ICD-250.00) (ICD10-E11.9)  LONG-TERM (CURRENT) USE OF STEROIDS (ICD-V58.65) (ICD10-Z79.51)  OSTEOPOROSIS SCREENING (ICD-V82.81) (ICD10-Z13.820)  TRANSAMINASES, SERUM, ELEVATED (ICD-790.4) (ICD10-R74.0)  OBESITY (ICD-278.00) (ICD10-E66.9)      DIABETES MELLITUS, TYPE II, UNCONTROLLED (ICD-250.02) (ICD10-E11.65)      FATTY LIVER DISEASE (ICD-571.8) (ICD10-K76.0)  ? 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)  CARPAL TUNNEL (ICD-354.0) (ICD10-G56.00)  OSTEOARTHRITIS (ICD-715.09) (ICD10-M15.9)  S/P ROTATOR CUFF SURGERY 12/2004 - RT SHOULDER; 2008 LT (ICD-V45.89)  Family Hx of MELANOMA, FAMILY HX (ICD-V16.8) (ICD10-Z80.8)  FRACTURE OF OTHER SPEC SITE,  PATHOLOGIC - MULTIPLE (ICD-733.19)  CHOLECYSTECTOMY AND HERNIA REPAIR (ICD-V45.89)  VITAMIN D DEFICIENCY (ICD-268.9) (ICD10-E55.9)  VACCINATION WITH TDAP (ICD-V06.8) (KVQ25-Z56)  COLONOSCOPY (ICD-V76.51) (ICD10-Z01.89)    Meds (prior to this call):   PREDNISONE 10 MG ORAL TABLET (PREDNISONE) TK 1 T PO  ONCE Daily  LEVOTHYROXINE SODIUM 25 MCG ORAL TABLET (LEVOTHYROXINE SODIUM) Take one tablet by mouth once daily  OMEPRAZOLE 20 MG ORAL CAPSULE DELAYED RELEASE (OMEPRAZOLE) Take one capsule by mouth twice a day  IBUPROFEN 600 MG ORAL TABLET (IBUPROFEN) 1 tab by mouth TID as needed for pain  BACLOFEN 10 MG ORAL TABLET (BACLOFEN) Please take half to one tablet as needed for back spasms  DICLOFENAC SODIUM 1 % TRANSDERMAL GEL (DICLOFENAC SODIUM) APP 2 GRAMS EXT AA BID      FREESTYLE FREEDOM LITE w/Device KIT (BLOOD GLUCOSE MONITORING SUPPL) use as directed (ICD 10 E11.65)      FREESTYLE LITE TEST IN VITRO STRIP (GLUCOSE BLOOD) check fingerstick three times a day (ICD 10 E11.65)      FREESTYLE LANCETS (LANCETS) check fingerstick three times a day (ICD 10 E11.65)  GLUCOPHAGE XR 500 MG ORAL TABLET EXTENDED RELEASE 24 HOUR (METFORMIN HCL) take four tablets by mouth daily every morning  ROSUVASTATIN CALCIUM 10 MG ORAL TABLET (ROSUVASTATIN CALCIUM) take  one tablet by mouth daily  GLIPIZIDE ER 5 MG ORAL TABLET EXTENDED RELEASE 24 HOUR (GLIPIZIDE) take 1 tablet by mouth daily  TRAMADOL HCL 50 MG ORAL TABLET (TRAMADOL HCL) take one tablet daily as needed for pain  OXYCODONE HCL 5 MG ORAL TABLET (OXYCODONE HCL) Partial fill upon patient request.  Take one tablet daily as needed  for pain >8  CALCIUM CARBONATE-VITAMIN D 600-200 MG-UNIT ORAL TABLET (CALCIUM CARBONATE-VITAMIN D) take one tablet twice a day  VALACYCLOVIR HCL 1 GM ORAL TABLET (VALACYCLOVIR HCL) one tablet by mouth twice daily  HUMIRA 40 MG/0.4ML SUBCUTANEOUS PREFILLED SYRINGE KIT (ADALIMUMAB)   GABAPENTIN 300 MG ORAL CAPSULE (GABAPENTIN) one capsule by mouth at bedtime as needed for pain  HYDROXYCHLOROQUINE SULFATE 200 MG ORAL TABLET (HYDROXYCHLOROQUINE SULFATE) one tablet oral daily          Created By Murlean Caller on 01/04/2017 at 08:13 AM    Electronically Signed By [name], MD on 01/04/2017 at 11:30 AM

## 2017-01-12 LAB — HX POINT OF CARE: HX HGB A1C, POC: 8.7 % — ABNORMAL HIGH

## 2017-01-17 ENCOUNTER — Ambulatory Visit

## 2017-01-17 MED ORDER — TRAMADOL HCL: 1 | 30 | 0 refills | 0 days | Status: AC

## 2017-01-18 ENCOUNTER — Ambulatory Visit

## 2017-01-18 NOTE — Telephone Encounter (Signed)
Call Details:   Patient PCP = [name], MD  [name] (Patient) called on January 18, 2017 10:36 AM.  Message taken by: Bartholomew Crews  Primary call-back number: [phone]    Secondary call-back number: () -    Call Reason(s): Message/Call-Back      ** MESSAGE / CALL-BACK.  Regarding: Pt states that she is going to refax the FMLA forms this afternoon when she gets to work. Please contact the pt once you have received the fax.    ---------- ---------- ---------- ---------- ---------- ----------       RESPONSE/ORDERS:  called pt and LMOM informing her forms are in pcp's mailbox to be signed ......................................Marland KitchenDupree Bouyer  January 18, 2017 4:19 PM               ORDERS/PROBS/MEDS/ALL     Problems:   SHINGLES (ICD-053.9) (ICD10-B02.9)  MAMMOGRAPHIC SCREENING FOR BREAST CANCER (ICD-V76.12) (ICD10-Z12.31)  DIABETES MELLITUS, TYPE II (ICD-250.00) (ICD10-E11.9)  LONG-TERM (CURRENT) USE OF STEROIDS (ICD-V58.65) (ICD10-Z79.51)  OSTEOPOROSIS SCREENING (ICD-V82.81) (ICD10-Z13.820)  TRANSAMINASES, SERUM, ELEVATED (ICD-790.4) (ICD10-R74.0)  OBESITY (ICD-278.00) (ICD10-E66.9)      DIABETES MELLITUS, TYPE II, UNCONTROLLED (ICD-250.02) (ICD10-E11.65)      FATTY LIVER DISEASE (ICD-571.8) (ICD10-K76.0)  ? 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)  CARPAL TUNNEL (ICD-354.0) (ICD10-G56.00)  OSTEOARTHRITIS (ICD-715.09) (ICD10-M15.9)  S/P ROTATOR CUFF SURGERY 12/2004 - RT SHOULDER; 2008 LT (ICD-V45.89)  Family Hx of MELANOMA, FAMILY HX (ICD-V16.8) (ICD10-Z80.8)  FRACTURE OF  OTHER SPEC SITE,  PATHOLOGIC - MULTIPLE (ICD-733.19)  CHOLECYSTECTOMY AND HERNIA REPAIR (ICD-V45.89)  VITAMIN D DEFICIENCY (ICD-268.9) (ICD10-E55.9)  VACCINATION WITH TDAP (ICD-V06.8) (UJW11-B14)  COLONOSCOPY (ICD-V76.51) (ICD10-Z01.89)    Meds (prior to this call):   PREDNISONE 10 MG ORAL TABLET (PREDNISONE) TK 1 T PO  ONCE Daily  LEVOTHYROXINE SODIUM 25 MCG ORAL TABLET (LEVOTHYROXINE SODIUM) Take one tablet by mouth once daily  OMEPRAZOLE 20 MG ORAL CAPSULE DELAYED RELEASE (OMEPRAZOLE) Take one capsule by mouth twice a day  IBUPROFEN 600 MG ORAL TABLET (IBUPROFEN) 1 tab by mouth TID as needed for pain  BACLOFEN 10 MG ORAL TABLET (BACLOFEN) Please take half to one tablet as needed for back spasms  DICLOFENAC SODIUM 1 % TRANSDERMAL GEL (DICLOFENAC SODIUM) APP 2 GRAMS EXT AA BID      FREESTYLE FREEDOM LITE w/Device KIT (BLOOD GLUCOSE MONITORING SUPPL) use as directed (ICD 10 E11.65)      FREESTYLE LITE TEST IN VITRO STRIP (GLUCOSE BLOOD) check fingerstick three times a day (ICD 10 E11.65)      FREESTYLE LANCETS (LANCETS) check fingerstick three times a day (ICD 10 E11.65)  GLUCOPHAGE XR 500 MG ORAL TABLET EXTENDED RELEASE 24 HOUR (METFORMIN HCL) take four tablets by mouth daily every morning  ROSUVASTATIN CALCIUM 10 MG ORAL TABLET (ROSUVASTATIN CALCIUM) take one tablet by mouth daily  GLIPIZIDE ER 5 MG ORAL TABLET EXTENDED RELEASE 24 HOUR (GLIPIZIDE) take 1 tablet by mouth daily  TRAMADOL HCL 50 MG ORAL TABLET (TRAMADOL HCL) take one tablet daily as needed for pain  OXYCODONE HCL 5 MG ORAL TABLET (OXYCODONE HCL) Partial fill upon patient request.  Take one tablet daily as needed for pain >8  CALCIUM CARBONATE-VITAMIN  D 600-200 MG-UNIT ORAL TABLET (CALCIUM CARBONATE-VITAMIN D) take one tablet twice a day  VALACYCLOVIR HCL 1 GM ORAL TABLET (VALACYCLOVIR HCL) one tablet by mouth twice daily  HUMIRA 40 MG/0.4ML SUBCUTANEOUS PREFILLED SYRINGE KIT (ADALIMUMAB)   GABAPENTIN 300 MG ORAL CAPSULE (GABAPENTIN) one capsule by  mouth at bedtime as needed for pain  HYDROXYCHLOROQUINE SULFATE 200 MG ORAL TABLET (HYDROXYCHLOROQUINE SULFATE) one tablet oral daily          Created By Bartholomew Crews on 01/18/2017 at 10:36 AM    Electronically Signed By [name], MD on 01/19/2017 at 12:45 PM

## 2017-01-19 ENCOUNTER — Ambulatory Visit

## 2017-01-19 ENCOUNTER — Ambulatory Visit: Admitting: Rheumatology

## 2017-01-19 MED ORDER — Hydroxychloroquine Sulfate: 200 | Tablet | Freq: Every day | 2 refills | 0 days | Status: AC

## 2017-01-19 NOTE — Telephone Encounter (Signed)
Call Details:   called pt to get more info on what her requests are for FMLA leave, before I can fill out her forms. LMOM. .......................................[name], MD  January 19, 2017 1:53 PM        RESPONSE/ORDERS:                 ORDERS/PROBS/MEDS/ALL     Problems:   SHINGLES (ICD-053.9) (ICD10-B02.9)  MAMMOGRAPHIC SCREENING FOR BREAST CANCER (ICD-V76.12) (ICD10-Z12.31)  DIABETES MELLITUS, TYPE II (ICD-250.00) (ICD10-E11.9)  LONG-TERM (CURRENT) USE OF STEROIDS (ICD-V58.65) (ICD10-Z79.51)  OSTEOPOROSIS SCREENING (ICD-V82.81) (ICD10-Z13.820)  TRANSAMINASES, SERUM, ELEVATED (ICD-790.4) (ICD10-R74.0)  OBESITY (ICD-278.00) (ICD10-E66.9)      DIABETES MELLITUS, TYPE II, UNCONTROLLED (ICD-250.02) (ICD10-E11.65)      FATTY LIVER DISEASE (ICD-571.8) (ICD10-K76.0)  ? 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)  CARPAL TUNNEL (ICD-354.0) (ICD10-G56.00)  OSTEOARTHRITIS (ICD-715.09) (ICD10-M15.9)  S/P ROTATOR CUFF SURGERY 12/2004 - RT SHOULDER; 2008 LT (ICD-V45.89)  Family Hx of MELANOMA, FAMILY HX (ICD-V16.8) (ICD10-Z80.8)  FRACTURE OF OTHER SPEC SITE,  PATHOLOGIC - MULTIPLE (ICD-733.19)  CHOLECYSTECTOMY AND HERNIA REPAIR (ICD-V45.89)  VITAMIN D DEFICIENCY (ICD-268.9) (ICD10-E55.9)  VACCINATION WITH TDAP (ICD-V06.8) ([id number])  COLONOSCOPY (ICD-V76.51) (ICD10-Z01.89)    Meds (prior to this call):   PREDNISONE 10 MG ORAL TABLET (PREDNISONE) TK 1 T PO  ONCE Daily  LEVOTHYROXINE SODIUM 25 MCG ORAL TABLET (LEVOTHYROXINE SODIUM) Take one tablet by mouth once daily  OMEPRAZOLE 20 MG  ORAL CAPSULE DELAYED RELEASE (OMEPRAZOLE) Take one capsule by mouth twice a day  IBUPROFEN 600 MG ORAL TABLET (IBUPROFEN) 1 tab by mouth TID as needed for pain  BACLOFEN 10 MG ORAL TABLET (BACLOFEN) Please take half to one tablet as needed for back spasms  DICLOFENAC SODIUM 1 % TRANSDERMAL GEL (DICLOFENAC SODIUM) APP 2 GRAMS EXT AA BID      FREESTYLE FREEDOM LITE w/Device KIT (BLOOD GLUCOSE MONITORING SUPPL) use as directed (ICD 10 E11.65)      FREESTYLE LITE TEST IN VITRO STRIP (GLUCOSE BLOOD) check fingerstick three times a day (ICD 10 E11.65)      FREESTYLE LANCETS (LANCETS) check fingerstick three times a day (ICD 10 E11.65)  GLUCOPHAGE XR 500 MG ORAL TABLET EXTENDED RELEASE 24 HOUR (METFORMIN HCL) take four tablets by mouth daily every morning  ROSUVASTATIN CALCIUM 10 MG ORAL TABLET (ROSUVASTATIN CALCIUM) take one tablet by mouth daily  GLIPIZIDE ER 5 MG ORAL TABLET EXTENDED RELEASE 24 HOUR (GLIPIZIDE) take 1 tablet by mouth daily  TRAMADOL HCL 50 MG ORAL TABLET (TRAMADOL HCL) take one tablet daily as needed for pain  OXYCODONE HCL 5 MG ORAL TABLET (OXYCODONE HCL) Partial fill upon patient request.  Take one tablet daily as needed for pain >8  CALCIUM CARBONATE-VITAMIN D 600-200 MG-UNIT ORAL TABLET (CALCIUM CARBONATE-VITAMIN D) take one tablet twice a day  VALACYCLOVIR HCL 1 GM ORAL TABLET (VALACYCLOVIR HCL) one tablet by mouth twice daily  HUMIRA 40 MG/0.4ML SUBCUTANEOUS PREFILLED SYRINGE KIT (ADALIMUMAB)   GABAPENTIN 300 MG ORAL CAPSULE (GABAPENTIN) one capsule by mouth at bedtime as needed for pain  HYDROXYCHLOROQUINE SULFATE 200 MG ORAL TABLET (HYDROXYCHLOROQUINE SULFATE) one tablet oral daily          Created By  [name], MD on 01/19/2017 at 01:52 PM    Electronically Signed By [name], MD on 01/23/2017 at 09:07 AM

## 2017-01-19 NOTE — Progress Notes (Signed)
* * *        **  [name]**    --- ---    31 Y old Female, DOB: 1961-01-09    [address] [place], [place] [address]    Home: [phone]    Provider: [name]        * * *    Telephone Encounter    ---    Answered by   Oswaldo Conroy  Date: 01/19/2017         Time: 11:37 AM    Reason   refill    --- ---            Message                      FYI HYDROXYCHLOROQINE 200mg                 Action Taken   Oswaldo Conroy 01/19/2017 11:39:01 AM >            Refills  Refill Hydroxychloroquine Sulfate Tablet, 200 mg, Orally, 30 Tablet,  1 tablet with food or milk, Once a day, 30 days, Refills=2    --- ---          * * *                ---          * * *          PatientDenny [name], [name] DOB: May 02, 1960 Provider: [name] 01/19/2017    ---    Note generated by eClinicalWorks EMR/PM Software ([email or web address])

## 2017-01-22 ENCOUNTER — Ambulatory Visit: Admitting: Surgery

## 2017-01-22 ENCOUNTER — Ambulatory Visit: Admit: 2017-01-22 | Payer: No Typology Code available for payment source

## 2017-01-22 MED ORDER — Sulindac: 150 | 42 | Freq: Two times a day (BID) | 0 refills | 0 days | Status: AC

## 2017-01-22 NOTE — Progress Notes (Signed)
* * *        [name], [name]**    --- ---    56 Y old Female, DOB: 1961-03-15, External MRN: [id number]    Account Number: [id number]    [address], [place], [place]    Home: [phone]    Guarantor: [name] Insurance: [place] Payer ID: PAPER    PCP: [name], MD Referring: Fortunato Curling, MD External Visit ID:  [id number]    [facility]        * * *    01/22/2017  Progress Notes: Victorio Palm, DPM **CHN#:** [id number]    --- ---    ---        Current Medications    ---    Taking     * Baclofen 10 MG Tablet 1/2 tab Orally prn    ---    * Calcium Carbonate-Vitamin D 600-200 MG-UNIT Capsule 1 capsule with a meal Orally Twice a day    ---    * GlipiZIDE XL 5 MG Tablet Extended Release 24 Hour 1 tablet Orally Once a day    ---    * Glucophage XR 500 mg Tablet Extended Release 24 Hour 4 tablets Orally Once a day    ---    * Humira Pen 40 MG/0.8ML Pen-injector Kit 0.8 ml Subcutaneous every 2 weeks    ---    * Hydroxychloroquine Sulfate 200 mg Tablet 1 tablet with food or milk Orally Once a day    ---    * Levothyroxine Sodium 25 MCG Tablet 1 tablet Orally Once a day    ---    * Omeprazole 40 mg Capsule Delayed Release 1 capsule Orally Once a day    ---    * PredniSONE 5 mg Tablet 2 tablet Orally Once a day    ---    * Ranitidine HCl 150 mg Capsule 1 capsule at bedtime Orally Once a day    ---    * Rosuvastatin Calcium 10 MG Tablet 1 tablet Orally Once a day    ---    * Tramadol HCl 50 MG Tablet 1 tablet Orally every 6 hrs    ---    Not-Taking/PRN    * Voltaren 1 % Gel 2 grams Transdermal twice daily, Notes: PRN    ---    * Medication List reviewed and reconciled with the patient    ---      Past Medical History    ---       Scleroderma - CREST dx 2007.        ---    IgG4 deficiency s/p IVIG 2010 ----single infusion given preventively after  week of bilateral knee replacements at [facility] 2010.        ---    Fx left wrist in 1994.        ---    Blood clot at LUE in 2006 on a short  course of Coumadin.        ---    Bilateral carpal tunnel syndrome s/p Lt carpal tunnel release and steroids  injection right--.        ---    Bone spur left foot.        ---    Scoliosis.        ---    Spinal stenosis s/p steroids injection.        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 Katrinka Blazing 2010--- never infected, but packed with antibiotics with  surgery;.        ---    Right rotator cuff repairs x 4, complicated by repeat tears, infection,  placement of anchor material.        ---    Elevated liver function tests.        ---    Diabetes.        ---      Surgical History    ---      Right rotator cuff repair, with infected hardware that had to be removed  2006    ---    Repeat right shoulder surgery, also which became infected. 2007    ---    Left rotator cuff repair 2008    ---    bilateral knee replacements 2009    ---    Left arthroscopic carpal tunnel release 01/2011    ---    ORIF Left 4th metatarsal bone    ---    Left Ulna shortening 1994    ---    Knee replacement 2010    ---    Hand surgery 2013, 2015    ---    remove gallbladder/hernia 1990    ---    Plate left foot 3rd metatarsal 2008    ---      Family History    ---      Mother: deceased 15 yrs, lung cancer, hyperthyroidism, diagnosed with Cancer    ---    Father: deceased 59s yrs, heart attack, diagnosed with Heart Disease    ---    3 brother(s) .    ---    FH of arthritis and scleroderma    Father deceased from MI    Mother deceased age 46 with lung cancer    Brother with scleroderma / copd.    ---      Social History    ---    Tobacco history: Never smoked.    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    Alcohol  Former daily EtOH use in 20s.   Nonsmoker.    Lives with longstanding boyfriend.    ---      Allergies    ---      N.K.D.A.    ---    [name]]      Hospitalization/Major Diagnostic Procedure    ---      as above    ---      Review of Systems    ---     _ORT_ :     Eyes No. Ear, Nose Throat No. Digestion, Stomach, Bowel No. Bladder Problems  No. Bleeding Problems No. Numbness/Tingling No. Anxiety/Depression No.  Fever/Chills/Fatigue No. Chest Pain/Tightness/Palpitations No. Skin Rash No.  Dental Problems No. Joint/Muscle Pain/Cramps Yes. Blackout/Fainting No. Other  No.            Reason for Appointment    ---      1\. Patient presents today for diabetic foot and nail care. She relates pain  in her second left toe that started 6 weeks ago. She has been applying band  aids and gauze to her toe for relief of pain. She relates pain while wearing  her shoes yesterday    ---      Vital Signs    ---    Pain scale 2, Ht-in 62, Ht-cm 157.48.      Examination    ---     _GENERAL_ :  1+/4 PT and DP pulses with neurological sensation is intact, negative  monofilament. skin is intact with no fissures, abscesses or ulcerations. there  is hammering of lesser toes, left worse than right. nails are onychauxic.  there is pain on the dorsal aspect of the 2nd PIP joint and 2nd MTP joint of  the left foot. there is pain on palpation at the base of the proximal phalanx  consistent with a plantar plate injury and capsulitis of joint.          Assessments    ---    1\. Capsulitis of toe of left foot - M77.52 (Primary)    ---    2\. Metatarsalgia, left foot - M77.42    ---    3\. Hammertoe of second toe of left foot - M20.42    ---      Treatment    ---       **1\. Others**    Start Sulindac Tablet, 150 mg, 1 tablet with food, Orally, Twice a day, 21  day(s), 42, Refills 0    Notes: Examined left foot. Discussed with patient option of surgery of plantar  plate to straighten left second toe. Applied a budin splint to the left second  toe and instructed her to keep it on for the next 4-6 weeks. Instructed  patient to obtain an MRI if there is no relief of pain. Provided her with a  electronic prescription sent to [facility] in [place], [place] for Clinoril 150mg  to  be taken BID for the next 21 days to  reduce the inflammation. Instructed her  on proper diabetic care of her feet and to wear shoe gear with proper  cushioning. She will follow-up in 2 months.    ---      Follow Up    ---    2 Months    Electronically signed by Victorio Palm , DPM on 01/26/2017 at 03:10 PM EDT    Sign off status: Completed        * * [facility]    [address]    [place], [place] [address]    Tel: [phone]    Fax: [phone]              * * *          Patient: [name], [name] DOB: Sep 05, 1960 Progress Note: Victorio Palm, DPM  01/22/2017    ---    Note generated by eClinicalWorks EMR/PM Software ([email or web address])

## 2017-01-22 NOTE — Progress Notes (Signed)
.  Progress Notes  .  Patient: [name], [name]  Provider: Biagio Borg  DOB: Jun 10, 1960 Age: 56 Y Sex: Female  .  PCP: [name]  MD  Date: 01/22/2017  .  --------------------------------------------------------------------------------  .  REASON FOR APPOINTMENT  .  1. Patient presents today for diabetic foot and nail care. She  relates pain in her second left toe that started 6 weeks ago. She  has been applying band aids and gauze to her toe for relief of  pain. She relates pain while wearing her shoes yesterday  .  CURRENT MEDICATIONS  .  Taking Baclofen 10 MG Tablet 1/2 tab Orally prn  Taking Calcium Carbonate-Vitamin D 600-200 MG-UNIT Capsule 1  capsule with a meal Orally Twice a day  Taking GlipiZIDE XL 5 MG Tablet Extended Release 24 Hour 1 tablet  Orally Once a day  Taking Glucophage XR 500 mg Tablet Extended Release 24 Hour 4  tablets Orally Once a day  Taking Humira Pen 40 MG/0.8ML Pen-injector Kit 0.8 ml  Subcutaneous every 2 weeks  Taking Hydroxychloroquine Sulfate 200 mg Tablet 1 tablet with  food or milk Orally Once a day  Taking Levothyroxine Sodium 25 MCG Tablet 1 tablet Orally Once a  day  Taking Omeprazole 40 mg Capsule Delayed Release 1 capsule Orally  Once a day  Taking PredniSONE 5 mg Tablet 2 tablet Orally Once a day  Taking Ranitidine HCl 150 mg Capsule 1 capsule at bedtime Orally  Once a day  Taking Rosuvastatin Calcium 10 MG Tablet 1 tablet Orally Once a  day  Taking Tramadol HCl 50 MG Tablet 1 tablet Orally every 6 hrs  Not-Taking/PRN Voltaren 1 % Gel 2 grams Transdermal twice daily,  Notes: PRN  Medication List reviewed and reconciled with the patient  .  PAST MEDICAL HISTORY  .  Scleroderma - CREST dx 2007  IgG4 deficiency s/p IVIG 2010 ----single infusion given  preventively after week of bilateral knee replacements at [facility]  2010  Fx left wrist in 1994  Blood clot at LUE in 2006 on a short course of Coumadin  Bilateral carpal tunnel syndrome s/p Lt carpal tunnel release  and  steroids injection right--  Bone spur left foot  Scoliosis  Spinal stenosis s/p steroids injection  s/p bilateral knee replacements for valgus /arthritic  complications, performed by Dr. Katrinka Blazing 2010--- never infected, but  packed with antibiotics with surgery;  Right rotator cuff repairs x 4, complicated by repeat tears,  infection, placement of anchor material  Elevated liver function tests  Diabetes  .  ALLERGIES  .  N.K.D.A.  .  SURGICAL HISTORY  .  Right rotator cuff repair, with infected hardware that had to be  removed 2006  Repeat right shoulder surgery, also which became infected. 2007  Left rotator cuff repair 2008  bilateral knee replacements 2009  Left arthroscopic carpal tunnel release 01/2011  ORIF Left 4th metatarsal bone  Left Ulna shortening 1994  Knee replacement 2010  Hand surgery 2013, 2015  remove gallbladder/hernia 1990  Plate left foot 3rd metatarsal 2008  .  FAMILY HISTORY  .  Mother: deceased 53 yrs, lung cancer, hyperthyroidism, diagnosed  with Cancer  Father: deceased 40s yrs, heart attack, diagnosed with Heart  Disease  3 brother(s) .  FH of arthritis and sclerodermaFather deceased from MIMother  deceased age 88 with lung cancer Brother with scleroderma / copd.  .  SOCIAL HISTORY  .  .  Tobacco  history: Never  smoked.  .  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  .  Alcohol Former daily EtOH use in 20s.  .  Nonsmoker.Lives with longstanding boyfriend.  [name]  HOSPITALIZATION/MAJOR DIAGNOSTIC PROCEDURE  .  as above  .  REVIEW OF SYSTEMS  .  ORT:  .  Eyes    No . Ear, Nose Throat    No . Digestion, Stomach, Bowel     No . Bladder Problems    No . Bleeding Problems    No .  Numbness/Tingling    No . Anxiety/Depression    No .  Fever/Chills/Fatigue    No . Chest Pain/Tightness/Palpitations     No . Skin Rash    No . Dental Problems    No . Joint/Muscle  Pain/Cramps    Yes . Blackout/Fainting    No . Other    No .  .  VITAL SIGNS  .  Pain scale 2, Ht-in 62, Ht-cm  157.48.  [name]  EXAMINATION  .  GENERAL: 1+/4 PT and DP pulses with neurological  sensation is intact, negative monofilament. skin is intact with  no fissures, abscesses or ulcerations. there is hammering of  lesser toes, left worse than right. nails are onychauxic. there  is pain on the dorsal aspect of the 2nd PIP joint and 2nd MTP  joint of the left foot. there is pain on palpation at the base of  the proximal phalanx consistent with a plantar plate injury and  capsulitis of joint.  .  ASSESSMENTS  .  Capsulitis of toe of left foot - M77.52 (Primary)  .  Metatarsalgia, left foot - M77.42  .  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  TREATMENT  .  Others  Start Sulindac Tablet, 150 mg, 1 tablet with food, Orally, Twice  a day, 21 day(s), 42, Refills 0  Notes: Examined left foot. Discussed with patient option of  surgery of plantar plate to straighten left second toe. Applied a  budin splint to the left second toe and instructed her to keep it  on for the next 4-6 weeks. Instructed patient to obtain an MRI if  there is no relief of pain. Provided her with a electronic  prescription sent to [facility] in [place], [place] for Clinoril 150mg   to be taken BID for the next 21 days to reduce the inflammation.  Instructed her on proper diabetic care of her feet and to wear  shoe gear with proper cushioning. She will follow-up in 2 months.  .  FOLLOW UP  .  2 Months  .  Electronically signed by Victorio Palm , DPM on  01/26/2017 at 03:10 PM EDT  .  Document electronically signed by Biagio Borg

## 2017-01-26 ENCOUNTER — Ambulatory Visit

## 2017-01-26 ENCOUNTER — Ambulatory Visit: Admitting: Internal Medicine

## 2017-01-26 ENCOUNTER — Ambulatory Visit: Admit: 2017-01-26 | Payer: 59

## 2017-01-26 LAB — HX CHOLESTEROL
HX CHOLESTEROL: 158 mg/dL (ref 110–199)
HX HIGH DENSITY LIPOPROTEIN CHOL (HDL): 69 mg/dL (ref 35–75)
HX LDL: 67 mg/dL (ref 0–129)
HX TRIGLYCERIDES: 110 mg/dL (ref 40–250)

## 2017-01-26 LAB — HX CHEM-LIPIDS
HX CHOL-HDL RATIO: 2.3
HX CHOLESTEROL: 158 mg/dL (ref 110–199)
HX HIGH DENSITY LIPOPROTEIN CHOL (HDL): 69 mg/dL (ref 35–75)
HX HOURS FAST: 11 h
HX LDL: 67 mg/dL (ref 0–129)
HX TRIGLYCERIDES: 110 mg/dL (ref 40–250)

## 2017-01-26 LAB — HX CHEM-METABOLIC: HX THYROID STIMULATING HORMONE (TSH): 2.83 u[IU]/mL (ref 0.35–4.94)

## 2017-01-26 LAB — HX POINT OF CARE: HX HGB A1C, POC: 8.3 % — ABNORMAL HIGH

## 2017-01-26 MED ORDER — GLIPIZIDE: 1 | 30 | 3 refills | 0 days

## 2017-01-26 NOTE — Progress Notes (Signed)
Checkout  Return to Clinic 3 months  Appointment Made Yes  RTC Date: 04/26/2017  Time: 9:20          Created By Orbie Pyo on 01/26/2017 at 10:07 AM    Electronically Signed By Orbie Pyo on 01/26/2017 at 12:37 PM

## 2017-01-26 NOTE — Progress Notes (Signed)
General Medicine Visit  .  Service Due by Standard Protocol Rules: DIAB EYE EX, LDL, PATPORTALPIN, HGB  A1C or HGBA1C%POC.    .  Initial Screening   * [place]: Samuels (Thelma)  Ht: 62.5 in.  Wt: 209 lbs.   BMI: 37.75  Temp: 96.4 deg F.     BP (Initial [place] Screening): 145 / 82      BP (Rechecked, Actionable): 122 /   77 mmHg   HR: 104     Health Mgmt materials? Handout for [employer]  Travel outside of the [place] in past 28 days:: No  .  Med List: PRINTED by [place] for patient   Chronic Pain Assessment: Does pt experience chronic pain ? YES  Severity of pain? (min=0, max=10) 6  Have you received a Flu shot recently (where)? workplace  Approximate Date: 01/15/2017  Smoking Assessment: Tobacco use? never smoker  HCP materials? Printed  .  [name]  Self Mgmt materials provided? Printed handout: Healthcare Proxy  .  Falls Risk Assessment:   In the past year, have you ... Had no falls  Difficulty with balance? NO  Need assistance with ambulation while here? NO  .  .....................................Marland KitchenMarland KitchenGordy Savers  January 26, 2017 9  :18 AM  .  Basic Visit  Chief Complaint:   annual exam  .  History of Present Illness:   38F here for annual exam  .  DM - Pt taking medications with good compliance.  FS have been in the range   of 90-140s.  She had 1 reading of 67 which she felt symptomatic with, and   it was when she hadn't eaten.  .  Obesity - not losing weight  .  Seeing Rheum for scleroderma, off humira, on prednisone  .  Seeing Ortho for Shoulder issues  .  Overall feels overwhelmed by all her ongoing medical problems.  .  .  .  Past Medical History:(reviewed)  HEALTH CARE MAINTENANCE  STD- low risk, deferred  Pap smear - Negative co-testing in 2015. Next 2020.  Mammogram - Stable focal asymmetry lower outer quadrant of midportion of ri  ght breast. Six-month follow-up right mammogram and annual screening left m  ammogram recommended and scheduled for 03/21/2017.  Colonoscopy -  done 2016, repeat 2017 and both times poor prep but  overall   has been sufficient for screening purposes.   In combination, these two col  onoscopies have likely given an adequate colon cancer screening. Recommend   repeat colonoscopy in 3 years.  1 polyp removed, next colonoscopy in 3 yrs   (2020). ***At that time in 2020, will do 7 days of daily miralax. Seven day  s low fiber diet. Two days clear liquids and a split prep with 2 dulcolax b  efore each dose of golytely.  DEXA (>50yr) - 2017 normal bone mass; on chronic prednisone use  Lung cancer screening (55-80)- N/A  Influenza - 01/2017  HPV - N/A  Hep C (1610-9604) - non reactive 2017  Zostavax - has not gotten vaccine yet; but got shingles in 2018 and she wil  l get the vaccine next year  Pneumonia - PPSV23 01/26/2017. Due for booster at age 50.  Tetanus and 11/04/2014  Contraception - post menopausal  LDL - 185   (08/05/2015)    HGBA1C - 8.7     (10/05/2016)  .  Family History: (reviewed)   Father: DMII, died of MI at 107  Mom: died of lung CA at 33  MGM and PGM: lung ca (smokers)  MGF and several other second degree relatives: brain aneurysms in their 58'  s.  2 maternal uncles:melanoma (one died of unknown cancer, one living)  brother recently diagnosed with systemic scleroderma  .  Social History: (reviewed)   Non-smoker, rare etoh, no IVDU. 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 Living together with her husband of /R/20 yea  rs. Sexually active with one female partner husband only. No children. Always   wears a seat belt.  [name]  HEALTH MAINTENANCE  TDAP administered 11/04/14, next 2026  Mammogram ordered now (2017)  Pap smear next in 2018  DEXA 10/2015, WNL  RTC in the fall for influenza vaccine  Colonoscopy: done 2016, repeat 2017 and both times poor prep but overall ha  s been sufficient for screening purposes.  1 polyp removed, next colonoscop  y in 3 yrs (2020). At that time in 2020, will do 7 days of daily miralax. S  even days low fiber diet.   Two days clear liquids and a  split prep with 2 dulcolax before each dose   of golytely.  .  .  A complete ROS was done. All positive responses are listed in HPI section.   All other systems reviewed in detail and are negative.  .  .  Past Medical History (prior to today's visit):  SHINGLES (ICD-053.9) (ICD10-B02.9)  MAMMOGRAPHIC SCREENING FOR BREAST CANCER (ICD-V76.12) (ICD10-Z12.31)  DIABETES MELLITUS, TYPE II (ICD-250.00) (ICD10-E11.9)  LONG-TERM (CURRENT) USE OF STEROIDS (ICD-V58.65) (ICD10-Z79.51)  OSTEOPOROSIS SCREENING (ICD-V82.81) (ICD10-Z13.820)  TRANSAMINASES, SERUM, ELEVATED (ICD-790.4) (ICD10-R74.0)  OBESITY (ICD-278.00) (ICD10-E66.9)      DIABETES MELLITUS, TYPE II, UNCONTROLLED (ICD-250.02) (ICD10-E11.65)      FATTY LIVER DISEASE (ICD-571.8) (ICD10-K76.0)  ? 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  Past Medical History (changes today):  Changed problem from SHINGLES (ICD-053.9) (ICD10-B02.9) to HERPETIC NEURALG  IA (ICD-053.19) (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) - Signed  Changed problem from COLONOSCOPY (ICD-V76.51) (ICD10-Z01.89) to COLONOSCOPY  , NEXT 2020-  SEE COMMENT (ICD-V76.51) (ICD10-Z01.89) - Signed  Removed problem of VACCINATION WITH TDAP (ICD-V06.8) (VWU98-J19) - Signed  Removed problem of MAMMOGRAPHIC SCREENING FOR BREAST CANCER (ICD-V76.12) (I  JY78-G95.62) - Signed  Removed problem of OSTEOPOROSIS SCREENING (ICD-V82.81) (ICD10-Z13.820) - Si  gned  Added new problem of ANNUAL EXAM (ICD-V72.31) (ICD10-Z00.00) - Signed  Added new problem of BACK PAIN, LUMBAR, CHRONIC (ICD-724.2) (ICD10-M54.5)  Added new problem of HYPERLIPIDEMIA (ICD-272.4) (ICD10-E78.5)  Added new problem of PNEUMOCOCCAL 23-VALENT POLYSACCHARIDE VACCINATION GIVE  N (ICD-V03.82) (ZHY86-V78)  Removed problem of Question of  OSTEOPOROSIS (ICD-733.01) (ICD10-M81.0)  Problems Reviewed:  Done  .  [name]  Medications (prior to today's visit):  PREDNISONE 10 MG ORAL TABLET (PREDNISONE) TK 1 T PO  ONCE  Daily  LEVOTHYROXINE SODIUM 25 MCG ORAL TABLET (LEVOTHYROXINE SODIUM) Take one tab  let by mouth once daily  OMEPRAZOLE 20 MG ORAL CAPSULE DELAYED RELEASE (OMEPRAZOLE) Take one capsule   by mouth twice a day  IBUPROFEN 600 MG ORAL TABLET (IBUPROFEN) 1 tab by mouth TID as needed for p  ain  BACLOFEN 10 MG ORAL TABLET (BACLOFEN) Please take half to one tablet as nee  ded for back spasms  DICLOFENAC SODIUM 1 % TRANSDERMAL GEL (DICLOFENAC SODIUM) APP 2 GRAMS EXT A  A BID      FREESTYLE FREEDOM LITE w/Device KIT (BLOOD GLUCOSE MONITORING SUPPL) [place]  e as directed (ICD 10 E11.65)      FREESTYLE LITE TEST IN VITRO STRIP (GLUCOSE BLOOD) check fingerstick th  ree times a day (ICD 10 E11.65)      FREESTYLE LANCETS (LANCETS) check fingerstick three times a day (ICD 10   E11.65)  GLUCOPHAGE XR 500 MG ORAL TABLET EXTENDED RELEASE 24 HOUR (METFORMIN HCL) t  ake four tablets by mouth daily every morning  ROSUVASTATIN CALCIUM 10 MG ORAL TABLET (ROSUVASTATIN CALCIUM) take one tabl  et by mouth daily  GLIPIZIDE ER 5 MG ORAL TABLET EXTENDED RELEASE 24 HOUR (GLIPIZIDE) take 1 t  ablet by mouth daily  TRAMADOL HCL 50 MG ORAL  TABLET (TRAMADOL HCL) take one tablet daily as need  ed for pain  OXYCODONE HCL 5 MG ORAL TABLET (OXYCODONE HCL) Partial fill upon patient re  quest.  Take one tablet daily as needed for pain >8  CALCIUM CARBONATE-VITAMIN D 600-200 MG-UNIT ORAL TABLET (CALCIUM CARBONATE-  VITAMIN D) take one tablet twice a day  VALACYCLOVIR HCL 1 GM ORAL TABLET (VALACYCLOVIR HCL) one tablet by mouth tw  ice daily  HUMIRA 40 MG/0.4ML SUBCUTANEOUS PREFILLED SYRINGE KIT (ADALIMUMAB)   GABAPENTIN 300 MG ORAL CAPSULE (GABAPENTIN) one capsule by mouth at bedtime   as needed for pain  HYDROXYCHLOROQUINE SULFATE 200 MG ORAL TABLET (HYDROXYCHLOROQUINE SULFATE)   one tablet oral daily  .  Medications (after today's visit):  PREDNISONE 5 MG ORAL TABLET (PREDNISONE) Take 1 tablet by mouth once daily  LEVOTHYROXINE SODIUM 25 MCG ORAL TABLET (LEVOTHYROXINE SODIUM) Take one tab  let by mouth once daily  OMEPRAZOLE 20 MG ORAL CAPSULE DELAYED RELEASE (OMEPRAZOLE) Take one capsule   by mouth twice a day  IBUPROFEN 600 MG ORAL TABLET (IBUPROFEN) 1 tab by mouth TID as needed for p  ain  BACLOFEN 10 MG ORAL TABLET (BACLOFEN) Please take half to one tablet as nee  ded for back spasms  DICLOFENAC SODIUM 1 % TRANSDERMAL GEL (DICLOFENAC SODIUM) APP 2 GRAMS EXT A  A BID      FREESTYLE FREEDOM LITE w/Device KIT (BLOOD GLUCOSE MONITORING SUPPL) [place]  e as directed (ICD 10 E11.65)      FREESTYLE LITE TEST IN VITRO STRIP (GLUCOSE BLOOD) check fingerstick th  ree times a day (ICD 10 E11.65)      FREESTYLE LANCETS (LANCETS) check fingerstick three times a day (ICD 10   E11.65)  GLUCOPHAGE XR 500 MG ORAL TABLET EXTENDED RELEASE 24 HOUR (METFORMIN HCL) t  ake four tablets by mouth daily every morning  ROSUVASTATIN CALCIUM 10 MG ORAL TABLET (ROSUVASTATIN CALCIUM) take one tabl  et by mouth daily  GLIPIZIDE XL 2.5 MG ORAL TABLET EXTENDED RELEASE 24 HOUR (GLIPIZIDE) Take o  ne tablet by mouth once daily  TRAMADOL HCL 50 MG ORAL TABLET (TRAMADOL HCL) take one tablet daily  as need  ed for pain  OXYCODONE HCL 5 MG ORAL TABLET (OXYCODONE HCL) Partial fill upon patient re  quest.  Take one tablet daily as needed for pain >8  CALCIUM CARBONATE-VITAMIN D 600-200 MG-UNIT ORAL TABLET (CALCIUM CARBONATE-  VITAMIN D) take one tablet twice a day  VALACYCLOVIR HCL 1 GM ORAL TABLET (VALACYCLOVIR HCL) one tablet by mouth tw  ice daily  GABAPENTIN 300 MG ORAL CAPSULE (GABAPENTIN) one capsule by mouth at bedtime   as needed for pain  HYDROXYCHLOROQUINE SULFATE 200 MG ORAL TABLET (HYDROXYCHLOROQUINE SULFATE)   one tablet oral daily  .  Medications Reviewed:  Done  .  [name]  No Known Allergies  Allergies Reviewed:  Done  .  .  .  .  .  Vitals:   Ht: 62.5 in.  Wt: 209 lbs.  BMI (in-lb) 37.75  Temp: 96.4deg F.     BP (Initial [place] Screening): 145 / 82     BP (Rechecked, Actionable): 122 / 7  7 mmHg   Pulse Rate: 104 bpm  .  .  Additional PE:   Gen: Alert, NAD, well hydrated, well developed  Head: NCAT  Ears: no external deformities, canals clear, TMs translucent, gross hearing   intact  Eyes: EOMI, PERRL, no scleral icterus, no conjunctival injection, vision gr  ossly normal  Nose: no bleeding, no polyps  Mouth: Good dentition, no lesions, tongue midline, no exudate/erythema  Neck: Supple, no ant/post cervical or supraclavicular lymphadenopathy,  no carotid bruits, thyroid not palpable  CV: RRR, no M/R/G, no edema  Breast:  No breast tenderness, palpable masses, or lesions.  No skin change  s.  No nipple discharge or bleeding.  Pulm: CTA b/l, resp effort wnl  Abd: soft, nontender, nondistended, no abdominal bruits, no hepatosplenomeg  aly  Skin: No rashes, no obvious bruising, no suspicious lesions  MS:  Normal gait; no cyanosis or clubbing; no LE edema; normal pedal pulses  Neuro:  CN II-XII intact; normal sensation; 5/5 strength throughout in all   extremities  Psych: Mood and affect appropriate  .  [name]  Assessment /T/ Plan:   DIABETES MELLITUS, TYPE II (ICD-250.00) (ICD10-E11.9)  - medications: glucophage,  glipizide  - check FS  - BP control good  - diabetic eye appt - has it scheduled upcoming next month  - neuropathy/foot exam: sees podatrist regularly  - encourage diet/exercise  - LDL - 185   (08/05/2015) Recheck today    - HGBA1C - 8.7     (10/05/2016)  improved to 8.3 today  - urine microalbumin - Not Calculated mg/g     (06/30/2016)   - return to clinic 3 months  .  HYPERLIPIDEMIA (ICD-272.4) (ICD10-E78.5) -continue statin - tolerating well  . Check lipids today  .  BACK PAIN, LUMBAR, CHRONIC (ICD-724.2) (ICD10-M54.5) PT referral given  .  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  LONG-TERM (CURRENT) USE OF STEROIDS (ICD-V58.65) (ICD10-Z79.51), SCLERODERM  A, 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) continue care with [name]. Chronic prednisone [place]  e is ongoing. DEXA 2017 showed normal bone mass. continue Ca+Vit D and omep  razole. Next DEXA 2019.  [name]  HYPOTHYROIDISM (ICD-244.9) (ICD10-E03.9) well controlled, no symptoms. chec  k TSH, continue levothyroxine   .  [name]  Orders (this visit):  Other Physical Therapy [Ref-PT]  Lipid Profile-Chol, HDL, Trig, LDL(calc) [CPT-80061]  TSH Reflex [CPT-84439]  Pneumovax 23 ([address]) [[id number]]  Pneumovax Admin (G0009) [11914782]  RTC in 3 months [RTC-090]  Est Preventive 40-64yo [CPT-99396]  .  Patient Care Plan  .  [name]  Immunization Worksheet 2016   .  Pneumovax 23 VIS VIS handout, Pneumovax PPSV23 07-25-2013  .  [name]  Electronically Signed by [name], MD on 01/26/2017 at 12:50 PM  ________________________________________________________________________

## 2017-01-26 NOTE — Progress Notes (Signed)
[facility] January 26, 2017  [address]   [place]  [place] [address]  Main: [phone]  Fax: [phone]  Patient Portal: [email or web address]              [name]  [address]  [place], [place]  [address]          MR#: [id number]          DOB: October 01, 1960    To Whom It May Concern:    I am referring [name] for PHYSICAL THERAPY:    Medical problems include:   Current Problems:   BACK PAIN, LUMBAR, CHRONIC (ICD-724.2) (ICD10-M54.5)  SPINAL STENOSIS, 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)      Diagnosis:   Back pain - Lumbar-sacral strain   Neck pain - Cervical strain   Shoulder pain - Rotator cuff syndrome    Frequency: as needed     Precautions: none    Please evaluate and treat as indicated.      Sincerely,      [name], MD  [facility]  [phone]      Created By [name], MD on 01/26/2017 at 09:40 AM    Electronically Signed By [name], MD on 01/26/2017 at 09:40 AM

## 2017-01-26 NOTE — Progress Notes (Signed)
[facility]  [address]  [place] [address]  Main: [phone]  Fax: [phone]  Patient Portal: [email or web address]        January 26, 2017            MRN: [id number]            DOB: Sep 23, 1960   [name]   [address]   [id number]      The results of your recent tests are as follows:      Your bloodwork looks good. Please continue taking your medications as prescribed.     Cholesterol Tests:      Total Cholesterol   158  Normal 110-199    01/26/2017    HDL (good cholesterol)  69  Normal >40     01/26/2017    Triglyceride    110  Normal 40-250    01/26/2017    LDL (bad cholesterol)   67  Normal <160    01/26/2017     Diabetes Tests:      HgbA1c:    8.3  Normal 4.3 - 5.6    01/26/2017    HgbA1c POC:    8.3 %  Normal 4.3 - 5.6    01/26/2017     Endocrine Tests:      TSH:     2.83  Normal 0.35-4.94   01/26/2017           Sincerely,         [name], MD  [facility]  [phone]        Created By Orbie Pyo on 01/26/2017 at 12:37 PM    Electronically Signed By [name], MD on 01/26/2017 at 05:39 PM

## 2017-01-29 ENCOUNTER — Ambulatory Visit

## 2017-01-29 MED ORDER — OXYCODONE HCL: 1 | 28 | 0 refills | 0 days

## 2017-01-29 NOTE — Progress Notes (Signed)
Chronic Controlled Substance       Med 1: Oxycodone hcl 5 mg oral tablet : Partial fill upon patient request.  Take one tablet daily as needed for pain >8  Type:  -  Med #1 Fill Date: 01/29/2017  Interval (days): 28  Next refill due: 02/26/2017    Last PMP Review: 01/29/2017              Risk Assessment             COMM Screening   Scale: (0)Never (1)Seldom (2)Sometimes (3)Often (4)Very Often            ORT Screening   Patient sex = Female          Treatment Plan           Morphine Equivalents   TOTAL 0 mg/day    Patient Care Plan      Prescriptions:  OXYCODONE HCL 5 MG ORAL TABLET (OXYCODONE HCL) Partial fill upon patient request.  Take one tablet daily as needed for pain >8  #28 x 0   Entered and Authorized by: [name], MD   Signed by: [name], MD on 01/29/2017   Method used: Print then Give to Patient   RxID: [id number]          Created By [name], MD on 01/29/2017 at 08:44 AM    Electronically Signed By [name], MD on 01/29/2017 at 08:46 AM

## 2017-02-11 ENCOUNTER — Ambulatory Visit

## 2017-02-12 ENCOUNTER — Ambulatory Visit

## 2017-02-12 MED ORDER — LANCETS: 100 | 1 refills | 0 days | Status: AC

## 2017-02-16 ENCOUNTER — Ambulatory Visit: Admitting: Rheumatology

## 2017-02-16 ENCOUNTER — Ambulatory Visit

## 2017-02-16 ENCOUNTER — Ambulatory Visit: Admit: 2017-02-16 | Payer: No Typology Code available for payment source

## 2017-02-16 LAB — HX CHEM-LFT
HX ALANINE AMINOTRANSFERASE (ALT/SGPT): 77 IU/L — ABNORMAL HIGH (ref 0–54)
HX ALKALINE PHOSPHATASE (ALK): 234 IU/L — ABNORMAL HIGH (ref 40–130)
HX ASPARTATE AMINOTRANFERASE (AST/SGOT): 43 IU/L — ABNORMAL HIGH (ref 10–42)
HX BILIRUBIN, TOTAL: 1.1 mg/dL (ref 0.2–1.1)

## 2017-02-16 LAB — HX CHEM-PANELS
HX BLOOD UREA NITROGEN: 19 mg/dL (ref 6–24)
HX CREATININE (CR): 0.85 mg/dL (ref 0.57–1.30)
HX GFR, AFRICAN AMERICAN: 90 mL/min/{1.73_m2}
HX GFR, NON-AFRICAN AMERICAN: 78 mL/min/{1.73_m2}

## 2017-02-16 LAB — HX HEM-ROUTINE
HX BASO #: 0 10*3/uL (ref 0.0–0.2)
HX BASO: 1 %
HX EOSIN #: 0.2 10*3/uL (ref 0.0–0.5)
HX EOSIN: 3 %
HX HCT: 42.9 % (ref 32.0–45.0)
HX HGB: 14.2 g/dL (ref 11.0–15.0)
HX IMMATURE GRANULOCYTE#: 0 10*3/uL (ref 0.0–0.1)
HX IMMATURE GRANULOCYTE: 0 %
HX LYMPH #: 2.6 10*3/uL (ref 1.0–4.0)
HX LYMPH: 36 %
HX MCH: 30.8 pg (ref 26.0–34.0)
HX MCHC: 33.1 g/dL (ref 32.0–36.0)
HX MCV: 93.1 fL (ref 80.0–98.0)
HX MONO #: 0.5 10*3/uL (ref 0.2–0.8)
HX MONO: 7 %
HX MPV: 10.8 fL (ref 9.1–11.7)
HX NEUT #: 3.7 10*3/uL (ref 1.5–7.5)
HX PLT: 208 10*3/uL (ref 150–400)
HX RBC BLOOD COUNT: 4.61 M/uL (ref 3.70–5.00)
HX RDW: 13.7 % (ref 11.5–14.5)
HX SEG NEUT: 53 %
HX WBC: 7 10*3/uL (ref 4.0–11.0)

## 2017-02-16 LAB — HX HEM-MISC: HX SED RATE: 28 mm (ref 0–30)

## 2017-02-16 LAB — HX IMMUNOLOGY: HX C-REACTIVE PROTEIN (HIGH SENSITIVITY): 4.22 mg/L (ref 0.00–7.48)

## 2017-02-16 MED ORDER — Hydroxychloroquine Sulfate: 200 | 90 | Freq: Every day | 1 refills | 0 days | Status: AC

## 2017-02-16 MED ORDER — Omeprazole: 40 | 90 | Freq: Every day | 1 refills | 0 days | Status: AC

## 2017-02-16 MED ORDER — PredniSONE: 10 | 180 | Freq: Every day | 1 refills | 0 days | Status: AC

## 2017-02-16 MED ORDER — Ranitidine HCl: 150 | 90 | Freq: Every day | 1 refills | 0 days | Status: AC

## 2017-02-16 NOTE — Progress Notes (Signed)
.  Progress Notes  .  Patient: [name], [name]  Provider: [name]  DOB: 1960-10-10 Age: 56 Y Sex: Female  .  PCP: [name]  MD  Date: 02/16/2017  .  --------------------------------------------------------------------------------  .  REASON FOR APPOINTMENT  .  1. Seronegative RA follow up  .  HISTORY OF PRESENT ILLNESS  .  GENERAL:  She reports feeling notably worse for the  last 2 months. Her hands are always swollen, painful, cold - left  worse than right. In addition, the rest of her joints are  painful.She is being followed by Dr. Vassie Loll in podiatry for a  torn tendon. Question of basal cell cancer near her eye, seeing  plastic surgery. Also had low back pain that radiates down her  leg, was unable to walk for 6 wks. No side effects from  medication but it is not helping. I, Coletta Memos, acted as  Neurosurgeon for [name]. Signature below:[name], Scribe  02/16/17, 10:01am.  .  CURRENT MEDICATIONS  .  Taking Baclofen 10 MG Tablet 1/2 tab Orally prn  Taking Calcium Carbonate-Vitamin D 600-200 MG-UNIT Capsule 1  capsule with a meal Orally Twice a day  Taking GlipiZIDE XL 5 MG Tablet Extended Release 24 Hour 1 tablet  Orally Once a day  Taking Glucophage XR 500 mg Tablet Extended Release 24 Hour 4  tablets Orally Once a day  Taking Hydroxychloroquine Sulfate 200 mg Tablet 1 tablet with  food or milk Orally Once a day  Taking Levothyroxine Sodium 25 MCG Tablet 1 tablet Orally Once a  day  Taking Omeprazole 40 mg Capsule Delayed Release 1 capsule Orally  Once a day  Taking PredniSONE 5 mg Tablet 2 tablet Orally Once a day  Taking Ranitidine HCl 150 mg Capsule 1 capsule at bedtime Orally  Once a day  Taking Rosuvastatin Calcium 10 MG Tablet 1 tablet Orally Once a  day  Taking Sulindac 150 mg Tablet 1 tablet with food Orally Twice a  day  Taking Tramadol HCl 50 MG Tablet 1 tablet Orally every 6 hrs  Not-Taking/PRN Humira Pen 40 MG/0.8ML Pen-injector Kit 0.8 ml  Subcutaneous every 2  weeks  Not-Taking/PRN Voltaren 1 % Gel 2 grams Transdermal twice daily,  Notes: PRN  Medication List reviewed and reconciled with the patient  .  PAST MEDICAL HISTORY  .  Scleroderma - CREST dx 2007  IgG4 deficiency s/p IVIG 2010 ----single infusion given  preventively after week of bilateral knee replacements at [facility]  2010  Fx left wrist in 1994  Blood clot at LUE in 2006 on a short course of Coumadin  Bilateral carpal tunnel syndrome s/p Lt carpal tunnel release and  steroids injection right--  Bone spur left foot  Scoliosis  Spinal stenosis s/p steroids injection  s/p bilateral knee replacements for valgus /arthritic  complications, performed by Dr. Katrinka Blazing 2010--- never infected, but  packed with antibiotics with surgery;  Right rotator cuff repairs x 4, complicated by repeat tears,  infection, placement of anchor material  Elevated liver function tests  Diabetes  .  ALLERGIES  .  N.K.D.A.  .  SURGICAL HISTORY  .  Right rotator cuff repair, with infected hardware that had to be  removed 2006  Repeat right shoulder surgery, also which became infected. 2007  Left rotator cuff repair 2008  bilateral knee replacements 2009  Left arthroscopic carpal tunnel release 01/2011  ORIF Left 4th metatarsal bone  Left Ulna shortening 1994  Knee  replacement 2010  Hand surgery 2013, 2015  remove gallbladder/hernia 1990  Plate left foot 3rd metatarsal 2008  .  FAMILY HISTORY  .  Mother: deceased 16 yrs, lung cancer, hyperthyroidism, diagnosed  with Cancer  Father: deceased 55s yrs, heart attack, diagnosed with Heart  Disease  3 brother(s) .  FH of arthritis and sclerodermaFather deceased from MIMother  deceased age 29 with lung cancer Brother with scleroderma / copd.  .  SOCIAL HISTORY  .  .  Tobacco  history: Never smoked.  .  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  .  Alcohol Former daily EtOH use in 20s.  .  Nonsmoker.Lives with longstanding boyfriend.  [name]  HOSPITALIZATION/MAJOR DIAGNOSTIC PROCEDURE  .  as  above  .  REVIEW OF SYSTEMS  .  ADULT Rheumatology ROS :  .  Constitutional    No Recent weight gain, Fatigue, Chills . Eyes     No Pain, Loss of vision, Itching eyes . HENT    No Headache  Ringing in ears Loss of hearing . Respiratory    No Shortness of  breath, Cough, Hemoptysis . Gastrointestinal    +Heartburn .  Genitourinary    No Difficult urination, Pus in urine, Blood in  urine . Musculoskeletal    +joint pain/ swelling, +muscle pain  .  Integumentary (skin and/or breast)    No Easy bruising, Rash, Sun  sensitive (sun allergy) . Neurological System    No numbness or  tingling, seizures, headache, weakness . Psychiatric    No  Excessive worries, Depression, Night sweats . Endocrine    No  Excessive thirst . Hematologic/Lymphatic    No Swollen glands,  Anemia, Lymphadenopathy . Allergic/Immunologic    No Frequent  sneezing, Increased susceptibility to infection, Raynauds . Heart     No Chest Pain, Heart murmur, Irregular heart beat .  Cardiovascular System    +Swelling/ whitening of hands/feet in  cold  .  [name]  VITAL SIGNS  .  Pain scale 7.5, Ht-in 62, Wt-lbs 210, BMI 38.41, BP 120/81, HR  99.  [name]  EXAMINATION  .  Rheumatology:  General AppearanceAlert and oriented , No apparent distress .  HENT:No, alopecia .  Eyes:No, scleral icterus, scleral erythema .  RJ:JOACZYS rate rhythm .  AYT:KZSWF LE edema .  Skin:No, rash .  Psych:No anxiety depression.  MuskuloskeletalElbows, shoulders, hips, knees have intact ROM  with no synovitis. Hands have synovitis in the PIPs and MCPs  bilaterally..  Skin and NailsMinimal sclerodactyly.  synovitis in MCPs and PIPs bilaterally.  [name]  RAPID 3: Function(0-10): 0.7. Pain(0-10):7.5.  Patient Global(0-10):1.0.  .  ASSESSMENTS  .  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  Long-term use of high-risk medication - Z79.899  .  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  She has ongoing synovitis despite treatment with prednisone. She  needs  additional treatment and we spoke about Plaquenil as a good  option. Additionally, I think she will need additional  immunosuppression, which is complicated by er IGG4 deficiency and  recent shingles due to immunosuppression. I recommended that we  initiate IVIg treatment. It may improve her autoimmune disease in  its own right, but also should bolster her immune system and  prevent shingles in the even we need to give her another agent  for her inflammatory arthritis. She is comfortable with this  plan. I will arrange home infusion for the IVIG and see  her back  in 2 months for further evaluation.  .  TREATMENT  .  Seronegative rheumatoid arthritis  Refill Hydroxychloroquine Sulfate Tablet, 200 mg, 1 tablet with  food or milk, Orally, Once a day, 90 days, 90, Refills 1  Refill Omeprazole Capsule Delayed Release, 40 mg, 1 capsule,  Orally, Once a day, 90 days, 90, Refills 1  Refill Ranitidine HCl Capsule, 150 mg, 1 capsule at bedtime,  Orally, Once a day, 90 days, 90, Refills 1  Refill PredniSONE Tablet, 10 MG, 2 tablet, Orally, Once a day, 90  days, 180, Refills 1  LAB: Alkaline Phosphatase (ALK)  Alkaline Phosphatase (ALK)     234     (40 - 130 - IU/L)  .  [name]  LAB: Bilirubin, Total (TBIL)  Bilirubin, Total     1.1     (0.2 - 1.1 - mg/dL)  .  [name]  LAB: Aspartate aminotransferase (AST)  Aspartate Aminotranferase (AST/SGOT)     43     (10 - 42 - IU/L)  .  [name]  LAB: Alanine aminotransferase (ALT)  Alanine Aminotransferase (ALT/SGPT)     77     (0 - 54 - IU/L)  .  [name]  LAB: Blood Urea Nitrogen (BUN)  Blood Urea Nitrogen     19     (6 - 24 - mg/dL)  .  [name]  LAB: Sed Rate ESR (ESR)  Sed Rate     28     (0 - 30 - mm)  .  [name]  LAB: C-Reactive Protein, High sens (CRPHS)  .  LAB: CBC/DIFF with PLT (CBCWD)  WBC     7.0     (4.0 - 11.0 - K/uL)  RBC     4.61     (3.70 - 5.00 - M/uL)  HGB     14.2     (11.0 - 15.0 - g/dL)  HCT     28.4     (13.2 - 45.0 - %)  MCV     93.1     (80.0 - 98.0 - fL)  MCH     30.8     (26.0 - 34.0 - pg)  MCHC      33.1     (32.0 - 36.0 - g/dL)  RDW     44.0     (10.2 - 14.5 - %)  PLT     208     (150 - 400 - K/uL)  MPV     10.8     (9.1 - 11.7 - fL)  SEG NEUT     53     ( - %)  LYMPH     36     ( - %)  MONO     7     ( - %)  EOS     3     ( - %)  BASO     1     ( - %)  NEUT #     3.7     (1.5 - 7.5 - K/uL)  LYMPH #     2.6     (1.0 - 4.0 - K/uL)  MONO #     0.5     (0.2 - 0.8 - K/uL)  EOSIN #     0.2     (0.0 - 0.5 - K/uL)  BASO #     0.0     (0.0 - 0.2 - K/uL)  Imm Grnas  0     ( - %)  Imm Grans, Abs     0.0     (0.0 - 0.1 - K/uL)  .  [name]  LAB: Creatinine (CR)  Creatinine (CR)     0.85     (0.57 - 1.30 - mg/dL)  .  FOLLOW UP  .  2 Months  .  Electronically signed by Erasmo Leventhal , MD on  03/04/2017 at 07:35 PM EST  .  Document electronically signed by [name]

## 2017-02-16 NOTE — Progress Notes (Signed)
* * *        [name]**    --- ---    56 Y old Female, DOB: May 21, 1960, External MRN: [id number]    Account Number: [id number]    [address] [place], [place]    Home: [phone]    Guarantor: [name] Insurance: [place]P [place]    PCP: [name], MD Referring: Fortunato Curling, MD    Appointment Facility: Rheumatology, All[facility] Immunology        * * *    02/16/2017  Progress Notes: [name], MD **CHN#:** [id number]    --- ---    ---        Reason for Appointment    ---      1\. Seronegative RA follow up    ---      History of Present Illness    ---     _GENERAL_ :    She reports feeling notably worse for the last 2 months. Her hands are always  swollen, painful, cold - left worse than right. In addition, the rest of her  joints are painful.    She is being followed by Dr. Vassie Loll in podiatry for a torn tendon.    Question of basal cell cancer near her eye, seeing plastic surgery.    Also had low back pain that radiates down her leg, was unable to walk for 6  wks.    No side effects from medication but it is not helping.    I, Coletta Memos, acted as Neurosurgeon for [name]. Signature below:    Coletta Memos, Scribe 02/16/17, 10:01am.      Current Medications    ---    Taking     * Baclofen 10 MG Tablet 1/2 tab Orally prn    ---    * Calcium Carbonate-Vitamin D 600-200 MG-UNIT Capsule 1 capsule with a meal Orally Twice a day    ---    * GlipiZIDE XL 5 MG Tablet Extended Release 24 Hour 1 tablet Orally Once a day    ---    * Glucophage XR 500 mg Tablet Extended Release 24 Hour 4 tablets Orally Once a day    ---    * Hydroxychloroquine Sulfate 200 mg Tablet 1 tablet with food or milk Orally Once a day    ---    * Levothyroxine Sodium 25 MCG Tablet 1 tablet Orally Once a day    ---    * Omeprazole 40 mg Capsule Delayed Release 1 capsule Orally Once a day    ---    * PredniSONE 5 mg Tablet 2 tablet Orally Once a day    ---    * Ranitidine HCl 150 mg Capsule 1 capsule at bedtime Orally Once a day     ---    * Rosuvastatin Calcium 10 MG Tablet 1 tablet Orally Once a day    ---    * Sulindac 150 mg Tablet 1 tablet with food Orally Twice a day    ---    * Tramadol HCl 50 MG Tablet 1 tablet Orally every 6 hrs    ---    Not-Taking/PRN    * Humira Pen 40 MG/0.8ML Pen-injector Kit 0.8 ml Subcutaneous every 2 weeks    ---    * Voltaren 1 % Gel 2 grams Transdermal twice daily, Notes: PRN    ---    * Medication List reviewed and reconciled with the patient    ---  Past Medical History    ---       Scleroderma - CREST dx 2007.        ---    IgG4 deficiency s/p IVIG 2010 ----single infusion given preventively after  week of bilateral knee replacements at [facility] 2010.        ---    Fx left wrist in 1994.        ---    Blood clot at LUE in 2006 on a short course of Coumadin.        ---    Bilateral carpal tunnel syndrome s/p Lt carpal tunnel release and steroids  injection right--.        ---    Bone spur left foot.        ---    Scoliosis.        ---    Spinal stenosis s/p steroids injection.        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 Katrinka Blazing 2010--- never infected, but packed with antibiotics with  surgery;.        ---    Right rotator cuff repairs x 4, complicated by repeat tears, infection,  placement of anchor material.        ---    Elevated liver function tests.        ---    Diabetes.        ---      Surgical History    ---      Right rotator cuff repair, with infected hardware that had to be removed  2006    ---    Repeat right shoulder surgery, also which became infected. 2007    ---    Left rotator cuff repair 2008    ---    bilateral knee replacements 2009    ---    Left arthroscopic carpal tunnel release 01/2011    ---    ORIF Left 4th metatarsal bone    ---    Left Ulna shortening 1994    ---    Knee replacement 2010    ---    Hand surgery 2013, 2015    ---    remove gallbladder/hernia 1990    ---    Plate left foot 3rd metatarsal 2008    ---      Family  History    ---      Mother: deceased 36 yrs, lung cancer, hyperthyroidism, diagnosed with Cancer    ---    Father: deceased 44s yrs, heart attack, diagnosed with Heart Disease    ---    3 brother(s) .    ---    FH of arthritis and scleroderma    Father deceased from MI    Mother deceased age 34 with lung cancer    Brother with scleroderma / copd.    ---      Social History    ---    Tobacco history: Never smoked.    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    Alcohol  Former daily EtOH use in 20s.   Nonsmoker.    Lives with longstanding boyfriend.    ---      Allergies    ---      N.K.D.A.    ---      Hospitalization/Major Diagnostic Procedure    ---      as above    ---      Review of  Systems    ---     _ADULT Rheumatology ROS_ :    Constitutional No Recent weight gain, Fatigue, Chills. Eyes No Pain, Loss of  vision, Itching eyes. HENT No Headache Ringing in ears Loss of hearing.  Respiratory No Shortness of breath, Cough, Hemoptysis. Gastrointestinal  **+Heartburn**. Genitourinary No Difficult urination, Pus in urine, Blood in  urine. Musculoskeletal **+joint pain/ swelling, +muscle pain**. Integumentary  (skin and/or breast) No Easy bruising, Rash, Sun sensitive (sun allergy).  Neurological System No numbness or tingling, seizures, headache, weakness.  Psychiatric No Excessive worries, Depression, Night sweats. Endocrine No  Excessive thirst. Hematologic/Lymphatic No Swollen glands, Anemia,  Lymphadenopathy. Allergic/Immunologic No Frequent sneezing, Increased  susceptibility to infection, Raynauds. Heart No Chest Pain, Heart murmur,  Irregular heart beat. Cardiovascular System **+Swelling/ whitening of  hands/feet in cold** .          Vital Signs    ---    Pain scale 7.5, Ht-in 62, Wt-lbs 210, BMI 38.41, BP 120/81, HR 99.      Examination    ---     _Rheumatology_ :    General Appearance Alert and oriented , No apparent distress .    HENT: No, alopecia .    Eyes: No, scleral icterus, scleral erythema .     CV: regular rate rhythm .    Ext: trace LE edema .    Skin: No, rash .    Psych: No anxiety depression.    Muskuloskeletal Elbows, shoulders, hips, knees have intact ROM with no  synovitis. Hands have synovitis in the PIPs and MCPs bilaterally..    Skin and Nails Minimal sclerodactyly.    synovitis in MCPs and PIPs bilaterally.     _RAPID 3_ :    Function (0-10): 0.7.        Pain (0-10): 7.5.        Patient Global (0-10): 1.0.          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 Long-term use of high-risk medication - Z79.899    ---    3\. Hammertoe of second toe of left foot - M20.42    ---    4\. 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 She needs  additional treatment and we spoke about Plaquenil as a good option.  Additionally, I think she will need additional immunosuppression, which is  complicated by her IGG4 deficiency and recent shingles due to  immunosuppression. She has had recurrent infections in the past thought to be  related to IGG4d, and was successfully treated then with IVIG. I think that  her recent episode of shingles is highly likely to be related to this problem  as well. Given her successful treatment in the past, I recommended that we  initiate IVIg treatment. It may improve her autoimmune disease in its own  right, but also should bolster her immune system and prevent shingles in the  even we need to give her another agent for her inflammatory arthritis. She is  comfortable with this plan. I will arrange home infusion for the IVIG and see  her back in 2 months for further evaluation.    ---      Treatment    ---       **1\. Seronegative rheumatoid arthritis**    Refill Hydroxychloroquine Sulfate Tablet, 200  mg, 1 tablet with food or milk,  Orally, Once a day, 90 days, 90, Refills 1    Refill Omeprazole Capsule Delayed Release, 40 mg, 1 capsule, Orally, Once a  day, 90 days, 90, Refills 1     Refill Ranitidine HCl Capsule, 150 mg, 1 capsule at bedtime, Orally, Once a  day, 90 days, 90, Refills 1    Refill PredniSONE Tablet, 10 MG, 2 tablet, Orally, Once a day, 90 days, 180,  Refills 1    _LAB: Alkaline Phosphatase (ALK)_   Alkaline Phosphatase (ALK)  234  H  40 -  130 - IU/L    --- --- --- ---    _LAB: Bilirubin, Total (TBIL)_ Bilirubin, Total  1.1    0.2 - 1.1 - mg/dL    --- --- --- ---    _LAB: Aspartate aminotransferase (AST)_ Aspartate Aminotranferase (AST/SGOT)   43  H  10 - 42 - IU/L    --- --- --- ---    _LAB: Alanine aminotransferase (ALT)_ Alanine Aminotransferase (ALT/SGPT)  77   H  0 - 54 - IU/L    --- --- --- ---    _LAB: Blood Urea Nitrogen (BUN)_ Blood Urea Nitrogen  19    6 - 24 - mg/dL    --- --- --- ---    _LAB: Sed Rate ESR (ESR)_ Sed Rate  28    0 - 30 - mm    --- --- --- ---    _LAB: C-Reactive Protein, High sens (CRPHS)_    _LAB: CBC/DIFF with PLT (CBCWD)_ WBC  7.0    4.0 - 11.0 - K/uL    --- --- --- ---    RBC  4.61    3.70 - 5.00 - M/uL    --- --- --- ---    HGB  14.2    11.0 - 15.0 - g/dL    --- --- --- ---    HCT  42.9    32.0 - 45.0 - %    --- --- --- ---    MCV  93.1    80.0 - 98.0 - fL    --- --- --- ---    MCH  30.8    26.0 - 34.0 - pg    --- --- --- ---    MCHC  33.1    32.0 - 36.0 - g/dL    --- --- --- ---    RDW  13.7    11.5 - 14.5 - %    --- --- --- ---    PLT  208    150 - 400 - K/uL    --- --- --- ---    MPV  10.8    9.1 - 11.7 - fL    --- --- --- ---    SEG NEUT  53     \- %    --- --- --- ---    LYMPH  36     \- %    --- --- --- ---    MONO  7     \- %    --- --- --- ---    EOS  3     \- %    --- --- --- ---    BASO  1     \- %    --- --- --- ---    NEUT #  3.7    1.5 - 7.5 - K/uL    --- --- --- ---    LYMPH #  2.6  1.0 - 4.0 - K/uL    --- --- --- ---    MONO #  0.5    0.2 - 0.8 - K/uL    --- --- --- ---    EOSIN #  0.2    0.0 - 0.5 - K/uL    --- --- --- ---    BASO #  0.0    0.0 - 0.2 - K/uL    --- --- --- ---    Imm Grnas  0     \- %    --- --- --- ---    Imm  Grans, Abs  0.0    0.0 - 0.1 - K/uL    --- --- --- ---    _LAB: Creatinine (CR)_ Creatinine (CR)  0.85    0.57 - 1.30 - mg/dL    --- --- --- ---      Follow Up    ---    2 Months    Electronically signed by Erasmo Leventhal , MD on 03/13/2017 at 12:45 PM EST    Sign off status: Completed        * * *        Rheumatology, Allergy and Immunology    [address]    [place], [place] [address]    Tel: [phone]    Fax: [phone]              * * *          Patient: [name], [name] DOB: 24-Feb-1961 Progress Note: [name], MD  02/16/2017    ---    Note generated by eClinicalWorks EMR/PM Software ([email or web address])

## 2017-02-16 NOTE — Progress Notes (Signed)
.  Progress Notes  .  Patient: [name], [name]  Provider: [name]  DOB: May 12, 1960 Age: 56 Y Sex: Female  .  PCP: [name]  MD  Date: 02/16/2017  .  --------------------------------------------------------------------------------  .  REASON FOR APPOINTMENT  .  1. Seronegative RA follow up  .  HISTORY OF PRESENT ILLNESS  .  GENERAL:  She reports feeling notably worse for the  last 2 months. Her hands are always swollen, painful, cold - left  worse than right. In addition, the rest of her joints are  painful.She is being followed by Dr. Vassie Loll in podiatry for a  torn tendon. Question of basal cell cancer near her eye, seeing  plastic surgery. Also had low back pain that radiates down her  leg, was unable to walk for 6 wks. No side effects from  medication but it is not helping. I, Coletta Memos, acted as  Neurosurgeon for [name]. Signature below:Shelagh Erroll Luna, Scribe  02/16/17, 10:01am.  .  CURRENT MEDICATIONS  .  Taking Baclofen 10 MG Tablet 1/2 tab Orally prn  Taking Calcium Carbonate-Vitamin D 600-200 MG-UNIT Capsule 1  capsule with a meal Orally Twice a day  Taking GlipiZIDE XL 5 MG Tablet Extended Release 24 Hour 1 tablet  Orally Once a day  Taking Glucophage XR 500 mg Tablet Extended Release 24 Hour 4  tablets Orally Once a day  Taking Hydroxychloroquine Sulfate 200 mg Tablet 1 tablet with  food or milk Orally Once a day  Taking Levothyroxine Sodium 25 MCG Tablet 1 tablet Orally Once a  day  Taking Omeprazole 40 mg Capsule Delayed Release 1 capsule Orally  Once a day  Taking PredniSONE 5 mg Tablet 2 tablet Orally Once a day  Taking Ranitidine HCl 150 mg Capsule 1 capsule at bedtime Orally  Once a day  Taking Rosuvastatin Calcium 10 MG Tablet 1 tablet Orally Once a  day  Taking Sulindac 150 mg Tablet 1 tablet with food Orally Twice a  day  Taking Tramadol HCl 50 MG Tablet 1 tablet Orally every 6 hrs  Not-Taking/PRN Humira Pen 40 MG/0.8ML Pen-injector Kit 0.8 ml  Subcutaneous every 2  weeks  Not-Taking/PRN Voltaren 1 % Gel 2 grams Transdermal twice daily,  Notes: PRN  Medication List reviewed and reconciled with the patient  .  PAST MEDICAL HISTORY  .  Scleroderma - CREST dx 2007  IgG4 deficiency s/p IVIG 2010 ----single infusion given  preventively after week of bilateral knee replacements at [facility]  2010  Fx left wrist in 1994  Blood clot at LUE in 2006 on a short course of Coumadin  Bilateral carpal tunnel syndrome s/p Lt carpal tunnel release and  steroids injection right--  Bone spur left foot  Scoliosis  Spinal stenosis s/p steroids injection  s/p bilateral knee replacements for valgus /arthritic  complications, performed by Dr. Katrinka Blazing 2010--- never infected, but  packed with antibiotics with surgery;  Right rotator cuff repairs x 4, complicated by repeat tears,  infection, placement of anchor material  Elevated liver function tests  Diabetes  .  ALLERGIES  .  N.K.D.A.  .  SURGICAL HISTORY  .  Right rotator cuff repair, with infected hardware that had to be  removed 2006  Repeat right shoulder surgery, also which became infected. 2007  Left rotator cuff repair 2008  bilateral knee replacements 2009  Left arthroscopic carpal tunnel release 01/2011  ORIF Left 4th metatarsal bone  Left Ulna shortening 1994  Knee  replacement 2010  Hand surgery 2013, 2015  remove gallbladder/hernia 1990  Plate left foot 3rd metatarsal 2008  .  FAMILY HISTORY  .  Mother: deceased 75 yrs, lung cancer, hyperthyroidism, diagnosed  with Cancer  Father: deceased 54s yrs, heart attack, diagnosed with Heart  Disease  3 brother(s) .  FH of arthritis and sclerodermaFather deceased from MIMother  deceased age 72 with lung cancer Brother with scleroderma / copd.  .  SOCIAL HISTORY  .  .  Tobacco  history: Never smoked.  .  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  .  Alcohol Former daily EtOH use in 20s.  .  Nonsmoker.Lives with longstanding boyfriend.  [name]  HOSPITALIZATION/MAJOR DIAGNOSTIC PROCEDURE  .  as  above  .  REVIEW OF SYSTEMS  .  ADULT Rheumatology ROS :  .  Constitutional    No Recent weight gain, Fatigue, Chills . Eyes     No Pain, Loss of vision, Itching eyes . HENT    No Headache  Ringing in ears Loss of hearing . Respiratory    No Shortness of  breath, Cough, Hemoptysis . Gastrointestinal    +Heartburn .  Genitourinary    No Difficult urination, Pus in urine, Blood in  urine . Musculoskeletal    +joint pain/ swelling, +muscle pain  .  Integumentary (skin and/or breast)    No Easy bruising, Rash, Sun  sensitive (sun allergy) . Neurological System    No numbness or  tingling, seizures, headache, weakness . Psychiatric    No  Excessive worries, Depression, Night sweats . Endocrine    No  Excessive thirst . Hematologic/Lymphatic    No Swollen glands,  Anemia, Lymphadenopathy . Allergic/Immunologic    No Frequent  sneezing, Increased susceptibility to infection, Raynauds . Heart     No Chest Pain, Heart murmur, Irregular heart beat .  Cardiovascular System    +Swelling/ whitening of hands/feet in  cold  .  [name]  VITAL SIGNS  .  Pain scale 7.5, Ht-in 62, Wt-lbs 210, BMI 38.41, BP 120/81, HR  99.  [name]  EXAMINATION  .  Rheumatology:  General AppearanceAlert and oriented , No apparent distress .  HENT:No, alopecia .  Eyes:No, scleral icterus, scleral erythema .  VW:UJWJXBJ rate rhythm .  YNW:GNFAO LE edema .  Skin:No, rash .  Psych:No anxiety depression.  MuskuloskeletalElbows, shoulders, hips, knees have intact ROM  with no synovitis. Hands have synovitis in the PIPs and MCPs  bilaterally..  Skin and NailsMinimal sclerodactyly.  synovitis in MCPs and PIPs bilaterally.  [name]  RAPID 3: Function(0-10): 0.7. Pain(0-10):7.5.  Patient Global(0-10):1.0.  .  ASSESSMENTS  .  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  Long-term use of high-risk medication - Z79.899  .  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  She has ongoing synovitis despite treatment with prednisone. She  needs  additional treatment and we spoke about Plaquenil as a good  option. Additionally, I think she will need additional  immunosuppression, which is complicated by her IGG4 deficiency  and recent shingles due to immunosuppression. She has had  recurrent infections in the past thought to be related to IGG4d,  and was successfully treated then with IVIG. I think that her  recent episode of shingles is highly likely to be related to this  problem as well. Given her successful treatment in the past, I  recommended that we initiate IVIg treatment. It may improve  her  autoimmune disease in its own right, but also should bolster her  immune system and prevent shingles in the even we need to give  her another agent for her inflammatory arthritis. She is  comfortable with this plan. I will arrange home infusion for the  IVIG and see her back in 2 months for further evaluation.  .  TREATMENT  .  Seronegative rheumatoid arthritis  Refill Hydroxychloroquine Sulfate Tablet, 200 mg, 1 tablet with  food or milk, Orally, Once a day, 90 days, 90, Refills 1  Refill Omeprazole Capsule Delayed Release, 40 mg, 1 capsule,  Orally, Once a day, 90 days, 90, Refills 1  Refill Ranitidine HCl Capsule, 150 mg, 1 capsule at bedtime,  Orally, Once a day, 90 days, 90, Refills 1  Refill PredniSONE Tablet, 10 MG, 2 tablet, Orally, Once a day, 90  days, 180, Refills 1  LAB: Alkaline Phosphatase (ALK)  Alkaline Phosphatase (ALK)     234     (40 - 130 - IU/L)  .  [name]  LAB: Bilirubin, Total (TBIL)  Bilirubin, Total     1.1     (0.2 - 1.1 - mg/dL)  .  [name]  LAB: Aspartate aminotransferase (AST)  Aspartate Aminotranferase (AST/SGOT)     43     (10 - 42 - IU/L)  .  [name]  LAB: Alanine aminotransferase (ALT)  Alanine Aminotransferase (ALT/SGPT)     77     (0 - 54 - IU/L)  .  [name]  LAB: Blood Urea Nitrogen (BUN)  Blood Urea Nitrogen     19     (6 - 24 - mg/dL)  .  [name]  LAB: Sed Rate ESR (ESR)  Sed Rate     28     (0 - 30 - mm)  .  [name]  LAB: C-Reactive Protein, High sens  (CRPHS)  .  LAB: CBC/DIFF with PLT (CBCWD)  WBC     7.0     (4.0 - 11.0 - K/uL)  RBC     4.61     (3.70 - 5.00 - M/uL)  HGB     14.2     (11.0 - 15.0 - g/dL)  HCT     16.1     (09.6 - 45.0 - %)  MCV     93.1     (80.0 - 98.0 - fL)  MCH     30.8     (26.0 - 34.0 - pg)  MCHC     33.1     (32.0 - 36.0 - g/dL)  RDW     04.5     (40.9 - 14.5 - %)  PLT     208     (150 - 400 - K/uL)  MPV     10.8     (9.1 - 11.7 - fL)  SEG NEUT     53     ( - %)  LYMPH     36     ( - %)  MONO     7     ( - %)  EOS     3     ( - %)  BASO     1     ( - %)  NEUT #     3.7     (1.5 - 7.5 - K/uL)  LYMPH #     2.6     (1.0 - 4.0 - K/uL)  MONO #  0.5     (0.2 - 0.8 - K/uL)  EOSIN #     0.2     (0.0 - 0.5 - K/uL)  BASO #     0.0     (0.0 - 0.2 - K/uL)  Imm Grnas     0     ( - %)  Imm Grans, Abs     0.0     (0.0 - 0.1 - K/uL)  .  [name]  LAB: Creatinine (CR)  Creatinine (CR)     0.85     (0.57 - 1.30 - mg/dL)  .  FOLLOW UP  .  2 Months  .  Electronically signed by Erasmo Leventhal , MD on  03/13/2017 at 12:45 PM EST  .  Document electronically signed by [name]

## 2017-02-18 ENCOUNTER — Ambulatory Visit

## 2017-02-19 ENCOUNTER — Ambulatory Visit

## 2017-02-19 MED ORDER — LEVOTHYROXINE SODIUM: 1 | 90 | 1 refills | 0 days | Status: DC

## 2017-02-26 ENCOUNTER — Ambulatory Visit: Admitting: Ophthalmology

## 2017-02-26 ENCOUNTER — Ambulatory Visit

## 2017-02-26 ENCOUNTER — Ambulatory Visit (HOSPITAL_BASED_OUTPATIENT_CLINIC_OR_DEPARTMENT_OTHER)

## 2017-02-26 ENCOUNTER — Ambulatory Visit: Admit: 2017-02-26 | Payer: No Typology Code available for payment source

## 2017-02-28 LAB — HX SURGICAL

## 2017-03-01 ENCOUNTER — Ambulatory Visit: Admitting: Rheumatology

## 2017-03-01 ENCOUNTER — Ambulatory Visit: Admitting: Hand Surgery

## 2017-03-01 NOTE — Progress Notes (Signed)
* * *        [name]**    --- ---    55 Y old Female, DOB: [birth year]    [address], [place], [place] [address]    Home: [phone]    Provider: [name]        * * *    Telephone Encounter    ---    Answered by   Peter Congo  Date: 03/01/2017         Time: 01:07 PM    Caller   Patient    --- ---            Reason   IVIg Therapy            Message                      Faxed order form to [facility], notified patient of dosage and infusion process.                 Action Taken   [facility] 03/05/2017 9:45:18 AM > [name]  [facility] is calling in regards to needing more clincals notes on IVG, and IVG  lab levels. Best ph: [phone] or Fax: [phone]. Thanks. [name],[name] ,  [name] 03/05/2017 10:16:57 AM > Spoke to [place], will have [name] update note  to speak mot to IgG deficiency and also get more updated labs.  [name],[name] 03/09/2017 10:11:48 AM > spoke to [place], wanted an update  on lab that she requested and Md notes, will relay msg to [place], [name].  [name],[name] 03/13/2017 1:04:49 PM > [name] is calling in regards to  above message. She is asking to connect with Zoe. Thanks. [name] states she  called 3 times and left several message and this very time sensative. Thanks.  [name],[name] , [name] 03/13/2017 2:09:42 PM > Who did she call? Did not receive  any messages. Sending updated note from [name] to [facility], LVM for [facility].  [name], can you order the labs? thanks! [name],[name] 03/13/2017 5:13:20 PM >  ordered! [name],[name] , [name] 03/14/2017 8:46:22 AM > Thanks [name]! [name],  can you reach out to the patient to come get labs drawn? Thanks! Ye,[name]  03/14/2017 11:07:36 AM > LVM for pt to give [place] a call back in regards to  when/where she'll like to do labs. [name],[name] 03/14/2017 12:07:56 PM >  Patient called back in regards above msg. She would like to come by the office  on Monday 12/17 around 8:15am to get the paperwork for the labs. Pt  requested  to get a call back to confirm that that time and date works, if she does not  pick up just leave her a vm confirming. She is at work and can't get on the  phone. Thanks. Ye,[name] 03/14/2017 2:23:48 PM > Informed pt. Labs ordered for  12/17. Acct number [id number]                * * *              * * *        ---        Reason for Appointment    ---      1\. IVIg Therapy    ---        **Medical History:**   Scleroderma - CREST dx 2007.    ---    IgG4 deficiency s/p IVIG 2010 ----single infusion given preventively  after  week of bilateral knee replacements at [facility] 2010.    ---    Fx left wrist in 1994.    ---    Blood clot at LUE in 2006 on a short course of Coumadin.    ---    Bilateral carpal tunnel syndrome s/p Lt carpal tunnel release and steroids  injection right--.    ---    Bone spur left foot.    ---    Scoliosis.    ---    Spinal stenosis s/p steroids injection.    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 Katrinka Blazing 2010--- never infected, but packed with antibiotics with  surgery;.    ---    Right rotator cuff repairs x 4, complicated by repeat tears, infection,  placement of anchor material.    ---    Elevated liver function tests.    ---    Diabetes.    ---      Assessments    ---    1\. 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 IgG4 deficiency**    _LAB: IgG Subcls_    ---          * * *           PatientMARCIA, [name] DOB: November 02, 1960 Provider: [name]  03/01/2017    ---    Note generated by eClinicalWorks EMR/PM Software ([email or web address])

## 2017-03-01 NOTE — Progress Notes (Signed)
* * *        **  [name]**    --- ---    25 Y old Female, DOB: 1961-03-30    [address], [place], [place] [address]    Home: [phone]    Provider: Yetta Numbers        * * *    Telephone Encounter    ---    Answered by   Charyl Dancer  Date: 03/01/2017         Time: 09:56 AM    Caller   [name]    --- ---            Reason   Appointment            Message                      Good morning,      Patient wants a call back from you regarding the letter she received from you to cancel Dr Ansh Fauble's appointment. Please call her back at [phone]                Action Taken   [facility] 03/01/2017 9:58:13 AM > Persico,Claudio  03/01/2017 11:16:22 AM > , Action - Pt telephoned. Spoke to pt.                * * *                ---          * * *          Patient: Agro, Evaluna DOB: [birth year] Provider: Yetta Numbers 03/01/2017    ---    Note generated by eClinicalWorks EMR/PM Software ([email or web address])

## 2017-03-16 ENCOUNTER — Ambulatory Visit

## 2017-03-19 ENCOUNTER — Ambulatory Visit: Admitting: Hand Surgery

## 2017-03-19 ENCOUNTER — Ambulatory Visit: Admitting: Rheumatology

## 2017-03-19 ENCOUNTER — Ambulatory Visit

## 2017-03-19 ENCOUNTER — Ambulatory Visit: Admit: 2017-03-19 | Payer: 59

## 2017-03-19 MED ORDER — LANCETS: 300 | 1 refills | 0 days | Status: AC

## 2017-03-19 MED ORDER — GLIPIZIDE: 1 | 90 | 1 refills | 0 days

## 2017-03-19 MED ORDER — METFORMIN HCL: 360 | 1 refills | 0 days

## 2017-03-19 MED ORDER — GLUCOSE BLOOD: 300 | 1 refills | 0 days

## 2017-03-19 MED ORDER — ROSUVASTATIN CALCIUM: 1 | 90 | 1 refills | 0 days

## 2017-03-19 MED ORDER — OXYCODONE HCL: 1 | 28 | 0 refills | 0 days

## 2017-03-19 MED ORDER — LEVOTHYROXINE SODIUM: 1 | 90 | 1 refills | 0 days | Status: DC

## 2017-03-19 NOTE — Progress Notes (Signed)
.  Progress Notes  .  Patient: [name], [name]  Provider: Yetta Numbers    .  DOB: Sep 20, 1960 Age: 56 Y Sex: Female  Supervising Provider:: Yetta Numbers, MD  Date: 03/19/2017  .  PCP: [name]  MD  Date: 03/19/2017  .  --------------------------------------------------------------------------------  .  REASON FOR APPOINTMENT  .  1. s/p left reverse TSA, DOS 03/07/16  .  HISTORY OF PRESENT ILLNESS  .  GENERAL:   [name] is a 56 y/o F who presents for follow up for above  procedure as well as concerns regarding her left hand and right  foot. She states that her shoulder is doing very well today and  she is very happy with the outcome of her surgery. She notes some  continued limitation with her internal rotation, but overall is  not limited in her daily activities. Today she endorses  generalized joint pain associated with her arthritis. She  currently sees rheumatology for this, but notes that she had to  stop her Humira due to develping shingles. She states that her  rheumatologist would like to start her on IgG infusions for three  months before beginning Humira again. Because of this she is  experiencing increased pain in her IF MCP joint. She notes pain  with all motions of her finger and is unable to make a full fist.  She has had a cortisone injection of this joint in the past and  would like to repeat the injection. She also notes pain in her  right 2nd toe today and is developing an ulcer at the PIP joint.  She has seen Dr. Vassie Loll for this, but would like a second opinion  regarding her options. Currently wears a splint to hold her toe  down and avoid rubbing in shoes.  .  CURRENT MEDICATIONS  .  Taking Baclofen 10 MG Tablet 1/2 tab Orally prn  Taking Calcium Carbonate-Vitamin D 600-200 MG-UNIT Capsule 1  capsule with a meal Orally Twice a day  Taking GlipiZIDE XL 5 MG Tablet Extended Release 24 Hour 1 tablet  Orally Once a day  Taking Glucophage XR 500 mg Tablet Extended Release 24 Hour 4  tablets  Orally Once a day  Taking Hydroxychloroquine Sulfate 200 mg Tablet 1 tablet with  food or milk Orally Once a day  Taking Levothyroxine Sodium 25 MCG Tablet 1 tablet Orally Once a  day  Taking Omeprazole 40 mg Capsule Delayed Release 1 capsule Orally  Once a day  Taking PredniSONE 10 MG Tablet 2 tablet Orally Once a day  Taking Ranitidine HCl 150 mg Capsule 1 capsule at bedtime Orally  Once a day  Taking Rosuvastatin Calcium 10 MG Tablet 1 tablet Orally Once a  day  Taking Sulindac 150 mg Tablet 1 tablet with food Orally Twice a  day  Taking Tramadol HCl 50 MG Tablet 1 tablet Orally every 6 hrs  Not-Taking/PRN Humira Pen 40 MG/0.8ML Pen-injector Kit 0.8 ml  Subcutaneous every 2 weeks  Not-Taking/PRN Voltaren 1 % Gel 2 grams Transdermal twice daily,  Notes: PRN  Medication List reviewed and reconciled with the patient  .  PAST MEDICAL HISTORY  .  Scleroderma - CREST dx 2007  IgG4 deficiency s/p IVIG 2010 ----single infusion given  preventively after week of bilateral knee replacements at [facility]  2010  Fx left wrist in 1994  Blood clot at LUE in 2006 on a short course of Coumadin  Bilateral carpal tunnel syndrome s/p Lt carpal tunnel release  and  steroids injection right--  Bone spur left foot  Scoliosis  Spinal stenosis s/p steroids injection  s/p bilateral knee replacements for valgus /arthritic  complications, performed by Dr. Katrinka Blazing 2010--- never infected, but  packed with antibiotics with surgery;  Right rotator cuff repairs x 4, complicated by repeat tears,  infection, placement of anchor material  Elevated liver function tests  Diabetes  .  ALLERGIES  .  N.K.D.A.  .  SURGICAL HISTORY  .  Right rotator cuff repair, with infected hardware that had to be  removed 2006  Repeat right shoulder surgery, also which became infected. 2007  Left rotator cuff repair 2008  bilateral knee replacements 2009  Left arthroscopic carpal tunnel release 01/2011  ORIF Left 4th metatarsal bone  Left Ulna shortening 1994  Knee replacement  2010  Hand surgery 2013, 2015  remove gallbladder/hernia 1990  Plate left foot 3rd metatarsal 2008  .  FAMILY HISTORY  .  Mother: deceased 62 yrs, lung cancer, hyperthyroidism, diagnosed  with Cancer  Father: deceased 48s yrs, heart attack, Heart Disease  3 brother(s) .  FH of arthritis and sclerodermaFather deceased from MIMother  deceased age 97 with lung cancer Brother with scleroderma / copd.  .  SOCIAL HISTORY  .  .  Tobacco  history: Never smoked.  .  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  .  Alcohol Former daily EtOH use in 20s.  .  Nonsmoker.Lives with longstanding boyfriend.  [name]  HOSPITALIZATION/MAJOR DIAGNOSTIC PROCEDURE  .  as above  .  REVIEW OF SYSTEMS  .  ORT:  .  Eyes    No . Ear, Nose Throat    No . Digestion, Stomach, Bowel     No . Bladder Problems    No . Bleeding Problems    No .  Numbness/Tingling    No . Anxiety/Depression    No .  Fever/Chills/Fatigue    No . Chest Pain/Tightness/Palpitations     No . Skin Rash    No . Dental Problems    No . Joint/Muscle  Pain/Cramps    Yes . Blackout/Fainting    No . Other    No .  .  VITAL SIGNS  .  Pain scale 7, Ht-in 62, Ht-cm 157.48.  [name]  PHYSICAL EXAMINATION  .  On examination [name] is a pleasant, well-appearing female in  no apparent distress. Examination of the left shoulder reveals  incision is well healed. There is no erythema, edema, or bony  deformity. There is no tenderness of the shoulder. ROM: forward  flexion 0-170, ER 0-30 degrees actively, 0-60 degrees passively,  IR 0-30 degrees. Rotator cuff takes good resistance. Examination  of the left hand reveals skin is clean and intact. There is  tenderness of the IF MCP joint. Pain with motion of the finger.  Unable to make composite fist. Hands are warm and well perfused  with palpable radial pulses and neurologic sensation intact to  light touch distally.Examination of the right foot reveals  ulceration at the dorsal surface of the 2nd toe PIP joint. There  is tenderness of the PIP  joint. Distal strength and ROM is  normal. Fires EHL/FHL/GS/TA. Neurologic sensation intact to light  touch in s/s/dpn/spn/t distributions. Lower extremities are warm  and well perfused with palpable DP and PT pulses.  .  ASSESSMENTS  .  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  Finger pain, left - M79.645  .  Hammer toe of right  foot - M20.41  .  TREATMENT  .  Rotator cuff arthropathy of left shoulder  Notes: Patient was seen and examined with Dr. [name]. [name]  is a 56 y/o F who is now one year post left TKA. She is doing  very well today and we are pleased with her progress. She can  continue with all activities as tolerated regarding her shoulder  with no restrictions. In regards to her arthritis related pain we  encouraged her to follow up with her rheumatologist to discussion  management of her medications. We offered her a cortisone  injection of her left IF MCP joint which she elected to try.  Injection was performed in office and patient tolerated procedure  well. In regards to her toe pain we discussed that outside of the  splinting that she is currently doing, surgery to fuse the PIP  joint is the best option. She will follow up with Dr. Vassie Loll to  discuss this further. We will plan to see her back in 2 months to  reevaluate with xrays of the left shoulder and hand. Patient  voiced agreement with treatment plan and all questions were  answered.  .  Written by Judie Grieve Case, ATC. .  .  PROCEDURES  .  Under sterile technique, the MCP joint of the left  IF was injected today with 0.75 cc of betamethasone and 0.25 cc  of 1% lidocaine. A bandaid was applied to the injection site and  the pt was given the standard post injection instructions.  (20600).  [name]  PROCEDURE CODES  .  20600 Arthrocentesis - Small  .  [id number] [place]  .  FOLLOW UP  .  2 Months  .  Electronically signed by Yetta Numbers , MD on  04/28/2017 at 10:15 PM EST  .  CONFIRMATORY SIGN OFF  .  [name]  Document electronically  signed by Yetta Numbers    .

## 2017-03-19 NOTE — Progress Notes (Signed)
* * *        [name]**    --- ---    56 Y old Female, DOB: 1960/04/20, External MRN: [id number]    Account Number: [id number]    [address] [place], [place]    Home: [phone]    Guarantor: [name] Insurance: NHP [place]    PCP: [name], MD Referring: Fortunato Curling, MD    Appointment Facility: [facility]        * * *    03/19/2017  Progress Notes: Yetta Numbers, MD **CHN#:** [phone]    --- ---    ---         **Reason for Appointment**    ---       1\. s/p left reverse TSA, DOS 03/07/16    ---       **History of Present Illness**    ---     _GENERAL_ :    [name] is a 56 y/o F who presents for follow up for above procedure as well  as concerns regarding her left hand and right foot. She states that her  shoulder is doing very well today and she is very happy with the outcome of  her surgery. She notes some continued limitation with her internal rotation,  but overall is not limited in her daily activities. Today she endorses  generalized joint pain associated with her arthritis. She currently sees  rheumatology for this, but notes that she had to stop her Humira due to  develping shingles. She states that her rheumatologist would like to start her  on IgG infusions for three months before beginning Humira again. Because of  this she is experiencing increased pain in her IF MCP joint. She notes pain  with all motions of her finger and is unable to make a full fist. She has had  a cortisone injection of this joint in the past and would like to repeat the  injection. She also notes pain in her right 2nd toe today and is developing an  ulcer at the PIP joint. She has seen Dr. Vassie Loll for this, but would like a  second opinion regarding her options. Currently wears a splint to hold her toe  down and avoid rubbing in shoes.       **Current Medications**    ---    Taking     * Baclofen 10 MG Tablet 1/2 tab Orally prn    ---    * Calcium Carbonate-Vitamin D 600-200 MG-UNIT  Capsule 1 capsule with a meal Orally Twice a day    ---    * GlipiZIDE XL 5 MG Tablet Extended Release 24 Hour 1 tablet Orally Once a day    ---    * Glucophage XR 500 mg Tablet Extended Release 24 Hour 4 tablets Orally Once a day    ---    * Hydroxychloroquine Sulfate 200 mg Tablet 1 tablet with food or milk Orally Once a day    ---    * Levothyroxine Sodium 25 MCG Tablet 1 tablet Orally Once a day    ---    * Omeprazole 40 mg Capsule Delayed Release 1 capsule Orally Once a day    ---    * PredniSONE 10 MG Tablet 2 tablet Orally Once a day    ---    * Ranitidine HCl 150 mg Capsule 1 capsule at bedtime Orally Once a day    ---    *  Rosuvastatin Calcium 10 MG Tablet 1 tablet Orally Once a day    ---    * Sulindac 150 mg Tablet 1 tablet with food Orally Twice a day    ---    * Tramadol HCl 50 MG Tablet 1 tablet Orally every 6 hrs    ---    Not-Taking/PRN    * Humira Pen 40 MG/0.8ML Pen-injector Kit 0.8 ml Subcutaneous every 2 weeks    ---    * Voltaren 1 % Gel 2 grams Transdermal twice daily, Notes: PRN    ---    * Medication List reviewed and reconciled with the patient    ---       **Past Medical History**    ---       Scleroderma - CREST dx 2007.        ---    IgG4 deficiency s/p IVIG 2010 ----single infusion given preventively after  week of bilateral knee replacements at [facility] 2010.        ---    Fx left wrist in 1994.        ---    Blood clot at LUE in 2006 on a short course of Coumadin.        ---    Bilateral carpal tunnel syndrome s/p Lt carpal tunnel release and steroids  injection right--.        ---    Bone spur left foot.        ---    Scoliosis.        ---    Spinal stenosis s/p steroids injection.        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 Katrinka Blazing 2010--- never infected, but packed with antibiotics with  surgery;.        ---    Right rotator cuff repairs x 4, complicated by repeat tears, infection,  placement of anchor material.        ---     Elevated liver function tests.        ---    Diabetes.        ---       **Surgical History**    ---       Right rotator cuff repair, with infected hardware that had to be removed  2006    ---    Repeat right shoulder surgery, also which became infected. 2007    ---    Left rotator cuff repair 2008    ---    bilateral knee replacements 2009    ---    Left arthroscopic carpal tunnel release 01/2011    ---    ORIF Left 4th metatarsal bone    ---    Left Ulna shortening 1994    ---    Knee replacement 2010    ---    Hand surgery 2013, 2015    ---    remove gallbladder/hernia 1990    ---    Plate left foot 3rd metatarsal 2008    ---       **Family History**    ---       Mother: deceased 47 yrs, lung cancer, hyperthyroidism, diagnosed with  Cancer    ---    Father: deceased 14s yrs, heart attack, Heart Disease    ---    3 brother(s) .    ---    FH of arthritis and scleroderma    Father deceased from MI  Mother deceased age 74 with lung cancer    Brother with scleroderma / copd.    ---       **Social History**    ---    Tobacco history: Never smoked.    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    Alcohol  Former daily EtOH use in 20s.   Nonsmoker.    Lives with longstanding boyfriend.    ---       **Allergies**    ---       N.K.D.A.    ---       **Hospitalization/Major Diagnostic Procedure**    ---       as above    ---      **Review of Systems**    ---     _ORT_ :    Eyes No. Ear, Nose Throat No. Digestion, Stomach, Bowel No. Bladder Problems  No. Bleeding Problems No. Numbness/Tingling No. Anxiety/Depression No.  Fever/Chills/Fatigue No. Chest Pain/Tightness/Palpitations No. Skin Rash No.  Dental Problems No. Joint/Muscle Pain/Cramps Yes. Blackout/Fainting No. Other  No.          **Vital Signs**    ---    Pain scale 7, Ht-in 62, Ht-cm 157.48.       **Physical Examination**    ---    On examination [name] is a pleasant, well-appearing female in no apparent  distress. Examination of the left shoulder  reveals incision is well healed.  There is no erythema, edema, or bony deformity. There is no tenderness of the  shoulder. ROM: forward flexion 0-170, ER 0-30 degrees actively, 0-60 degrees  passively, IR 0-30 degrees. Rotator cuff takes good resistance. Examination of  the left hand reveals skin is clean and intact. There is tenderness of the IF  MCP joint. Pain with motion of the finger. Unable to make composite fist.  Hands are warm and well perfused with palpable radial pulses and neurologic  sensation intact to light touch distally.    Examination of the right foot reveals ulceration at the dorsal surface of the  2nd toe PIP joint. There is tenderness of the PIP joint. Distal strength and  ROM is normal. Fires EHL/FHL/GS/TA. Neurologic sensation intact to light touch  in s/s/dpn/spn/t distributions. Lower extremities are warm and well perfused  with palpable DP and PT pulses.       **Assessments**    ---    1\. 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 Finger pain, left - M79.645    ---    3\. 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 Rotator cuff arthropathy of left shoulder**    Notes:  Patient was seen and examined with Dr. [name]. [name] is a 56 y/o  F who is now one year post left TKA. She is doing very well today and we are  pleased with her progress. She can continue with all activities as tolerated  regarding her shoulder with no restrictions. In regards to her arthritis  related pain we encouraged her to follow up with her rheumatologist to  discussion management of her medications. We offered her a cortisone injection  of her left IF MCP joint which she elected to try. Injection was performed in  office and patient tolerated procedure well. In regards to her toe pain we  discussed that outside of the  splinting that she is currently doing, surgery  to fuse the PIP joint is the best option. She will follow up with Dr. Vassie Loll  to  discuss this further. We will plan to see her back in 2 months to  reevaluate with xrays of the left shoulder and hand. Patient voiced agreement  with treatment plan and all questions were answered.        Written by Judie Grieve Case, ATC. [name]    ---      **Procedures**    ---    Under sterile technique, the MCP joint of the left IF was injected today with  0.75 cc of betamethasone and 0.25 cc of 1% lidocaine. A bandaid was applied to  the injection site and the pt was given the standard post injection  instructions. (20600).       **Procedure Codes**    ---       20600 Arthrocentesis - Small    ---    W0981 Celestone    ---       **Follow Up**    ---    2 Months    Electronically signed by Yetta Numbers , MD on 04/28/2017 at 10:15 PM EST    Sign off status: Completed        * * *        [facility]    [address]    [place], [address]    [place], [place] [address]    Tel: [phone]    Fax: [phone]              * * *          Patient: [name], [name] DOB: [birth year] Progress Note: Yetta Numbers, MD  03/19/2017    ---    Note generated by eClinicalWorks EMR/PM Software ([email or web address])

## 2017-03-21 ENCOUNTER — Ambulatory Visit

## 2017-03-21 ENCOUNTER — Ambulatory Visit: Admitting: Internal Medicine

## 2017-03-21 ENCOUNTER — Ambulatory Visit: Admit: 2017-03-21 | Payer: No Typology Code available for payment source

## 2017-03-22 ENCOUNTER — Ambulatory Visit

## 2017-03-22 LAB — HX IMMUNOLOGY
HX IGG SUBCLASS TOTAL: 583 mg/dL — ABNORMAL LOW
HX IGG SUBCLASS-1: 290 mg/dL — ABNORMAL LOW
HX IGG SUBCLASS-2: 240 mg/dL — ABNORMAL LOW
HX IGG SUBCLASS-3: 23 mg/dL
HX IGG SUBCLASS-4: 1.1 mg/dL — ABNORMAL LOW

## 2017-03-22 NOTE — Progress Notes (Signed)
[facility] March 22, 2017  [address]   [place]  [place] [address]  Main: [phone]  Fax: [phone]  Patient Portal: [name]  [address]  [place], [place]  [address]          MR#: [id number]          DOB: 09/21/1960    Dear  [name],      I wanted to let you know that your mammogram was normal.  You should come back in 1-2 years for another mammogram.    I hope you continue to feel well.  Please call me if you have any questions.        Sincerely,      [name], MD  [facility]  [phone]    ** Please be aware of our new extended hours to better care for you. Hours are: Monday through Thursday from 8 am to 8 pm; Friday from 8 am to 5 pm; Saturday from 8 am to 12 noon.      Created By [name], MD on 03/22/2017 at 06:33 PM    Electronically Signed By [name], MD on 03/22/2017 at 06:33 PM

## 2017-03-23 ENCOUNTER — Ambulatory Visit: Admitting: Surgery

## 2017-03-23 ENCOUNTER — Ambulatory Visit: Admit: 2017-03-23 | Payer: 59

## 2017-03-23 NOTE — Progress Notes (Signed)
.  Progress Notes  .  Patient: [name], LINENBERGER  Provider: Biagio Borg  DOB: 01-14-1961 Age: 56 Y Sex: Female  .  PCP: [name]  MD  Date: 03/23/2017  .  --------------------------------------------------------------------------------  .  REASON FOR APPOINTMENT  .  1. Patient relates continued pain in the right second toe with  redness and irritation over the second PIP joint. She is  concerned due to diabetes  .  CURRENT MEDICATIONS  .  Taking Baclofen 10 MG Tablet 1/2 tab Orally prn  Taking Calcium Carbonate-Vitamin D 600-200 MG-UNIT Capsule 1  capsule with a meal Orally Twice a day  Taking GlipiZIDE XL 5 MG Tablet Extended Release 24 Hour 1 tablet  Orally Once a day  Taking Glucophage XR 500 mg Tablet Extended Release 24 Hour 4  tablets Orally Once a day  Taking Hydroxychloroquine Sulfate 200 mg Tablet 1 tablet with  food or milk Orally Once a day  Taking Levothyroxine Sodium 25 MCG Tablet 1 tablet Orally Once a  day  Taking Omeprazole 40 mg Capsule Delayed Release 1 capsule Orally  Once a day  Taking PredniSONE 10 MG Tablet 2 tablet Orally Once a day  Taking Ranitidine HCl 150 mg Capsule 1 capsule at bedtime Orally  Once a day  Taking Rosuvastatin Calcium 10 MG Tablet 1 tablet Orally Once a  day  Taking Sulindac 150 mg Tablet 1 tablet with food Orally Twice a  day  Taking Tramadol HCl 50 MG Tablet 1 tablet Orally every 6 hrs  Not-Taking/PRN Humira Pen 40 MG/0.8ML Pen-injector Kit 0.8 ml  Subcutaneous every 2 weeks  Not-Taking/PRN Voltaren 1 % Gel 2 grams Transdermal twice daily,  Notes: PRN  .  PAST MEDICAL HISTORY  .  Scleroderma - CREST dx 2007  IgG4 deficiency s/p IVIG 2010 ----single infusion given  preventively after week of bilateral knee replacements at [facility]  2010  Fx left wrist in 1994  Blood clot at LUE in 2006 on a short course of Coumadin  Bilateral carpal tunnel syndrome s/p Lt carpal tunnel release and  steroids injection right--  Bone spur left foot  Scoliosis  Spinal stenosis s/p  steroids injection  s/p bilateral knee replacements for valgus /arthritic  complications, performed by Dr. Katrinka Blazing 2010--- never infected, but  packed with antibiotics with surgery;  Right rotator cuff repairs x 4, complicated by repeat tears,  infection, placement of anchor material  Elevated liver function tests  Diabetes  .  ALLERGIES  .  N.K.D.A.  .  SURGICAL HISTORY  .  Right rotator cuff repair, with infected hardware that had to be  removed 2006  Repeat right shoulder surgery, also which became infected. 2007  Left rotator cuff repair 2008  bilateral knee replacements 2009  Left arthroscopic carpal tunnel release 01/2011  ORIF Left 4th metatarsal bone  Left Ulna shortening 1994  Knee replacement 2010  Hand surgery 2013, 2015  remove gallbladder/hernia 1990  Plate left foot 3rd metatarsal 2008  .  FAMILY HISTORY  .  Mother: deceased 83 yrs, lung cancer, hyperthyroidism, diagnosed  with Cancer  Father: deceased 17s yrs, heart attack, diagnosed with Heart  Disease  3 brother(s) .  FH of arthritis and sclerodermaFather deceased from MIMother  deceased age 36 with lung cancer Brother with scleroderma / copd.  .  SOCIAL HISTORY  .  .  Tobacco  history: Never smoked.  .  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  .  Alcohol Former daily EtOH  use in 66s.  .  Nonsmoker.Lives with longstanding boyfriend.  [name]  HOSPITALIZATION/MAJOR DIAGNOSTIC PROCEDURE  .  as above  .  REVIEW OF SYSTEMS  .  ORT:  .  Eyes    No . Ear, Nose Throat    No . Digestion, Stomach, Bowel     No . Bladder Problems    No . Bleeding Problems    No .  Numbness/Tingling    No . Anxiety/Depression    No .  Fever/Chills/Fatigue    No . Chest Pain/Tightness/Palpitations     No . Skin Rash    No . Dental Problems    No . Joint/Muscle  Pain/Cramps    Yes . Blackout/Fainting    No . Other    No .  .  VITAL SIGNS  .  Pain scale 2, Ht-in 62, Ht-cm 157.48.  [name]  EXAMINATION  .  GENERAL: 1+/4 PT and DP pulses with neurological  sensation is intact, negative  monofilament. skin is intact with  no fissures, abscesses or ulcerations. there is hammering of  lesser toes, right worse than left. nails are onychauxic. there  is pain on the dorsal aspect of the 2nd PIP joint and 2nd MTP  joint of the right foot. there is pain on palpation of the PIP  joint with mild erythema and edema of the PIP joint on the second  toe right foot. semi rigid dorsal contracture of the toe.  .  ASSESSMENTS  .  Type 2 diabetes mellitus without complication, without long-term  current use of insulin - E11.9 (Primary)  .  Hammertoe of second toe of right foot - M20.41  .  Pain of right foot - M79.671  .  TREATMENT  .  Others  Notes: Applied accomodative gel sleeve to the right second toe.  Discussed options for treatment including comfortable supportive  shoe gear, cushioning and budin splints and surgical management  with arthrodesis of the second PIP joint of the right foot with  acumed hammertoe pin. Patient will consider the options and  follow up in two months.  .  FOLLOW UP  .  2 Months  .  Electronically signed by Victorio Palm , DPM on  03/29/2017 at 08:28 AM EST  .  Document electronically signed by Biagio Borg

## 2017-03-23 NOTE — Progress Notes (Signed)
* * *        [name], [name]**    --- ---    55 Y old Female, DOB: 12-25-1960, External MRN: [id number]    Account Number: [id number]    [address], [place], [place]    Home: [phone]    Guarantor: [name] Insurance: [place]P [place] Payer ID: PAPER    PCP: [name], MD Referring: Fortunato Curling, MD External Visit ID:  [id number]    Appointment Facility: [facility]        * * *    03/23/2017  Progress Notes: Victorio Palm, DPM **CHN#:** [phone]    --- ---    ---        Current Medications    ---    Taking     * Baclofen 10 MG Tablet 1/2 tab Orally prn    ---    * Calcium Carbonate-Vitamin D 600-200 MG-UNIT Capsule 1 capsule with a meal Orally Twice a day    ---    * GlipiZIDE XL 5 MG Tablet Extended Release 24 Hour 1 tablet Orally Once a day    ---    * Glucophage XR 500 mg Tablet Extended Release 24 Hour 4 tablets Orally Once a day    ---    * Hydroxychloroquine Sulfate 200 mg Tablet 1 tablet with food or milk Orally Once a day    ---    * Levothyroxine Sodium 25 MCG Tablet 1 tablet Orally Once a day    ---    * Omeprazole 40 mg Capsule Delayed Release 1 capsule Orally Once a day    ---    * PredniSONE 10 MG Tablet 2 tablet Orally Once a day    ---    * Ranitidine HCl 150 mg Capsule 1 capsule at bedtime Orally Once a day    ---    * Rosuvastatin Calcium 10 MG Tablet 1 tablet Orally Once a day    ---    * Sulindac 150 mg Tablet 1 tablet with food Orally Twice a day    ---    * Tramadol HCl 50 MG Tablet 1 tablet Orally every 6 hrs    ---    Not-Taking/PRN    * Humira Pen 40 MG/0.8ML Pen-injector Kit 0.8 ml Subcutaneous every 2 weeks    ---    * Voltaren 1 % Gel 2 grams Transdermal twice daily, Notes: PRN    ---      Past Medical History    ---       Scleroderma - CREST dx 2007.        ---    IgG4 deficiency s/p IVIG 2010 ----single infusion given preventively after  week of bilateral knee replacements at [facility] 2010.        ---    Fx left wrist in 1994.        ---    Blood clot at LUE in 2006 on a  short course of Coumadin.        ---    Bilateral carpal tunnel syndrome s/p Lt carpal tunnel release and steroids  injection right--.        ---    Bone spur left foot.        ---    Scoliosis.        ---    Spinal stenosis s/p steroids injection.        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 Katrinka Blazing 2010--- never infected, but packed with antibiotics with  surgery;.        ---    Right rotator cuff repairs x 4, complicated by repeat tears, infection,  placement of anchor material.        ---    Elevated liver function tests.        ---    Diabetes.        ---      Surgical History    ---      Right rotator cuff repair, with infected hardware that had to be removed  2006    ---    Repeat right shoulder surgery, also which became infected. 2007    ---    Left rotator cuff repair 2008    ---    bilateral knee replacements 2009    ---    Left arthroscopic carpal tunnel release 01/2011    ---    ORIF Left 4th metatarsal bone    ---    Left Ulna shortening 1994    ---    Knee replacement 2010    ---    Hand surgery 2013, 2015    ---    remove gallbladder/hernia 1990    ---    Plate left foot 3rd metatarsal 2008    ---      Family History    ---      Mother: deceased 85 yrs, lung cancer, hyperthyroidism, diagnosed with Cancer    ---    Father: deceased 47s yrs, heart attack, diagnosed with Heart Disease    ---    3 brother(s) .    ---    FH of arthritis and scleroderma    Father deceased from MI    Mother deceased age 7 with lung cancer    Brother with scleroderma / copd.    ---      Social History    ---    Tobacco history: Never smoked.    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    Alcohol  Former daily EtOH use in 20s.   Nonsmoker.    Lives with longstanding boyfriend.    ---      Allergies    ---      N.K.D.A.    ---    [name]]      Hospitalization/Major Diagnostic Procedure    ---      as above    ---      Review of Systems    ---     _ORT_  :    Eyes No. Ear, Nose Throat No. Digestion, Stomach, Bowel No. Bladder Problems  No. Bleeding Problems No. Numbness/Tingling No. Anxiety/Depression No.  Fever/Chills/Fatigue No. Chest Pain/Tightness/Palpitations No. Skin Rash No.  Dental Problems No. Joint/Muscle Pain/Cramps Yes. Blackout/Fainting No. Other  No.            Reason for Appointment    ---      1\. Patient relates continued pain in the right second toe with redness and  irritation over the second PIP joint. She is concerned due to diabetes    ---      Vital Signs    ---    Pain scale 2, Ht-in 62, Ht-cm 157.48.      Examination    ---     _GENERAL_ :    1+/4 PT and DP pulses with neurological sensation is intact, negative  monofilament. skin is intact with no fissures,  abscesses or ulcerations. there  is hammering of lesser toes, right worse than left. nails are onychauxic.  there is pain on the dorsal aspect of the 2nd PIP joint and 2nd MTP joint of  the right foot. there is pain on palpation of the PIP joint with mild erythema  and edema of the PIP joint on the second toe right foot. semi rigid dorsal  contracture of the toe.          Assessments    ---    1\. Type 2 diabetes mellitus without complication, without long-term current  use of insulin - E11.9 (Primary)    ---    2\. Hammertoe of second toe of right foot - M20.41    ---    3\. Pain of right foot - M79.671    ---      Treatment    ---       **1\. Others**    Notes: Applied accomodative gel sleeve to the right second toe. Discussed  options for treatment including comfortable supportive shoe gear, cushioning  and budin splints and surgical management with arthrodesis of the second PIP  joint of the right foot with acumed hammertoe pin. Patient will consider the  options and follow up in two months.    ---      Follow Up    ---    2 Months    Electronically signed by Victorio Palm , DPM on 03/29/2017 at 08:28 AM EST    Sign off status: Completed        * * [facility]    [address]    [place], [place] [address]    Tel: [phone]    Fax: [phone]              * * *          Patient: [name], [name] DOB: 1961/01/21 Progress Note: Victorio Palm, DPM  03/23/2017    ---    Note generated by eClinicalWorks EMR/PM Software ([email or web address])

## 2017-04-02 ENCOUNTER — Ambulatory Visit: Admitting: Rheumatology

## 2017-04-02 NOTE — Progress Notes (Signed)
* * *        **  [name]**    --- ---    53 Y old Female, DOB: [birth year]    [address], [place], [place] [address]    Home: [phone]    Provider: [name]        * * *    Telephone Encounter    ---    Answered by   Peter Congo  Date: 04/02/2017         Time: 11:41 AM    Reason   IVIg PA Denied    --- ---            Message                      Hi [name], the IVIg PA was denied. Denial reasoning: "[facility] IVIg policy does not allow coverage of immune globulin products for IgG subclass deficiency if the patient does not have normal toal immune globuline g (IgG) and immunoglobulin M (IgM) levels and normal or low immunoglobulin A (IgA) levels before treatment. In addition, if it is unknown if the patient demostrated an impaired antibody response to vaccination with a pneumococcal polysaccaride vaccine (with a copy of the laboratory report with post-vaccine titers attached)." How would you like to proceed?                 Action Taken                      [name],[name] , [name] 04/02/2017 11:50:43 AM >       [name],[name] , [name] 04/17/2017 10:32:59 AM > Spoke to [place] at [facility], denial was an error and has been overturned, IVIg now approved through 09/30/2017. Updating order and sedning to [facility]. firs      [name],[name] , [name] 04/17/2017 2:22:38 PM > All set, patients first home infusion scheduled for 04/27/2017                    * * *                ---          * * *          Patient: KHILEY, LIESER DOB: [birth year] Provider: [name] 04/02/2017    ---    Note generated by eClinicalWorks EMR/PM Software ([email or web address])

## 2017-04-26 ENCOUNTER — Ambulatory Visit

## 2017-04-27 ENCOUNTER — Ambulatory Visit: Admitting: Rheumatology

## 2017-04-27 NOTE — Progress Notes (Signed)
* * *        **  [name]**    --- ---    89 Y old Female, DOB: Aug 11, 1960    [address], [place], [place] [address]    Home: [phone]    Provider: [name]        * * *    Telephone Encounter    ---    Answered by   Peter Congo  Date: 04/27/2017         Time: 01:49 PM    Reason   IVIG PA Extension    --- ---            Message                      Recieved notice from Debbie at [facility] [phone]) that they required additional lab and clinical info to extend IVIG PA >6. Faxed info to [phone].                Action Taken                      [name],[name] , [name] 04/27/2017 1:50:55 PM >                     * * *                ---          * * *          PatientRENLEY, [name] DOB: 1961/02/17 Provider: [name] 04/27/2017    ---    Note generated by eClinicalWorks EMR/PM Software ([email or web address])

## 2017-05-01 ENCOUNTER — Ambulatory Visit

## 2017-05-01 NOTE — Progress Notes (Signed)
[facility] May 01, 2017  [address]   [place]  [place] [address]  Main: [phone]  Fax: [phone]  Patient Portal: [email or web address]       MR#: [id number]  [address]  [place], [place]  [address]       DOB: [birth year]      Dear  [name],      We are sorry to see that you were unable to keep your recent appointment at [facility] ([facility] Your health is important to [place]. Please call the office as soon as it is convenient so that our staff may reschedule your appointment.    We now have a Patient Portal [email or web address] so you can review your medical records, refill prescriptions and request appointments.       Sincerely,     Your Care Team at [facility]  [phone]    ** Please be aware of our new extended hours to better care for you. Hours are: Monday through Thursday from 8 am to 8 pm; Friday from 8 am to 5 pm; Saturday from 8 am to 12 noon.    We do appreciate at least 24 hours notice to cancel an appointment. If this letter is in error, we do apologize.        Created By [name] Held on 05/01/2017 at 11:52 AM    Electronically Signed By [name] Held on 05/01/2017 at 11:52 AM

## 2017-05-18 ENCOUNTER — Ambulatory Visit: Admitting: Hand Surgery

## 2017-05-18 ENCOUNTER — Ambulatory Visit: Admitting: Orthopaedic Surgery

## 2017-05-18 ENCOUNTER — Ambulatory Visit

## 2017-05-18 ENCOUNTER — Ambulatory Visit: Admit: 2017-05-18 | Payer: 59

## 2017-05-18 NOTE — Progress Notes (Signed)
* * *        [name]**    --- ---    25 Y old Female, DOB: [birth year], External MRN: [id number]    Account Number: [id number]    [address] [place], [place]    Home: [phone]    Guarantor: [name] Insurance: [employer]P [place]    PCP: [name] Referring: [name]    Appointment Facility[facility]        * * *    05/18/2017   **Appointment Provider:** Dorian Pod **CHN#:** [id number]    --- ---      **Supervising Provider:** Yetta Numbers, MD    ---         **Reason for Appointment**    ---       1\. RT [facility] BURSITIS    ---    2\. 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 She reports the shoulder is  doing great and she is very happy with her surgery. In regards to the left  hand, she had a CSI to the IF MCP joint last visit, which provided good  symptom relief. She notes symptoms are not 100% but is happy with the  injection. She reports of a slip and fall that occurred in 03/2017, catching  the side rail with her left hand. She felt pain on RF the next day and has  been sore since then, accompanied with swelling about the entire PIP joint.  Her worse symptom today are located on the lateral aspect of the right hip,  characterized as dull ache with sharp pain at times, and does not radiate.  Denies numbness or paresthesias.       **Current Medications**    ---    Taking     * Baclofen 10 MG Tablet 1/2 tab Orally prn    ---    * Calcium Carbonate-Vitamin D 600-200 MG-UNIT Capsule 1 capsule with a meal Orally Twice a day    ---    * GlipiZIDE XL 5 MG Tablet Extended Release 24 Hour 1 tablet Orally Once a day    ---    * Glucophage XR 500 mg Tablet Extended Release 24 Hour 4 tablets Orally Once a day    ---    *  Hydroxychloroquine Sulfate 200 mg Tablet 1 tablet with food or milk Orally Once a day    ---    * Levothyroxine Sodium 25 MCG Tablet 1 tablet Orally Once a day    ---    * Omeprazole 40 mg Capsule Delayed Release 1 capsule Orally Once a day    ---    * PredniSONE 10 MG Tablet 2 tablet Orally Once a day    ---    * Ranitidine HCl 150 mg Capsule 1 capsule at bedtime Orally Once a day    ---    * Rosuvastatin Calcium 10 MG Tablet 1 tablet Orally Once a day    ---    * Sulindac 150 mg Tablet 1 tablet with food Orally Twice a day    ---    * Tramadol HCl 50 MG Tablet 1 tablet Orally  every 6 hrs    ---    Not-Taking/PRN    * Humira Pen 40 MG/0.8ML Pen-injector Kit 0.8 ml Subcutaneous every 2 weeks    ---    * Voltaren 1 % Gel 2 grams Transdermal twice daily, Notes: PRN    ---    * Medication List reviewed and reconciled with the patient    ---       **Past Medical History**    ---       Scleroderma - CREST dx 2007.        ---    IgG4 deficiency s/p IVIG 2010 ----single infusion given preventively after  week of bilateral knee replacements at [facility] 2010.        ---    Fx left wrist in 1994.        ---    Blood clot at LUE in 2006 on a short course of Coumadin.        ---    Bilateral carpal tunnel syndrome s/p Lt carpal tunnel release and steroids  injection right--.        ---    Bone spur left foot.        ---    Scoliosis.        ---    Spinal stenosis s/p steroids injection.        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 Katrinka Blazing 2010--- never infected, but packed with antibiotics with  surgery;.        ---    Right rotator cuff repairs x 4, complicated by repeat tears, infection,  placement of anchor material.        ---    Elevated liver function tests.        ---    Diabetes.        ---       **Surgical History**    ---       Right rotator cuff repair, with infected hardware that had to be removed  2006    ---    Repeat right shoulder surgery, also which became  infected. 2007    ---    Left rotator cuff repair 2008    ---    bilateral knee replacements 2009    ---    Left arthroscopic carpal tunnel release 01/2011    ---    ORIF Left 4th metatarsal bone    ---    Left Ulna shortening 1994    ---    Knee replacement 2010    ---    Hand surgery 2013, 2015    ---    remove gallbladder/hernia 1990    ---    Plate left foot 3rd metatarsal 2008    ---       **Family History**    ---       Mother: deceased 63 yrs, lung cancer, hyperthyroidism, diagnosed with  Cancer    ---    Father: deceased 58s yrs, heart attack, Heart Disease    ---    3 brother(s) .    ---    FH of arthritis and scleroderma\nFather deceased from MI\nMother deceased age  44 with lung cancer \\nBrother  with scleroderma \/ copd.    ---       **Social History**    ---    Tobacco history: Never smoked.    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    Alcohol  Former daily EtOH use  in 54s.   Nonsmoker.    Lives with longstanding boyfriend.    ---       **Allergies**    ---       N.K.D.A.    ---       **Hospitalization/Major Diagnostic Procedure**    ---       as above    ---      **Review of Systems**    ---     _ORT_ :    Eyes No. Ear, Nose Throat No. Digestion, Stomach, Bowel No. Bladder Problems  No. Bleeding Problems No. Numbness/Tingling No. Anxiety/Depression No.  Fever/Chills/Fatigue No. Chest Pain/Tightness/Palpitations No. Skin Rash No.  Dental Problems No. Joint/Muscle Pain/Cramps Yes. Blackout/Fainting No. Other  No.          **Vital Signs**    ---    Pain scale 7, Ht-in 62, Ht-cm 157.48.       **Physical Examination**    ---    57yo female, well-appearing, in no acute distress. Examination of the left  shoulder reveals incision is well healed. There is no erythema, edema, or bony  deformity. There is no tenderness of the shoulder. ROM: FF 0-180, ER 0-45, IR  0-30 degrees. Rotator cuff takes good resistance. Examination of the left hand  reveals skin is clean and intact. There is swelling and  tenderness of the RF  PIP joint. Pain with hand ROM and comes close to making a fist. RF lacks 20deg  of full extension at PIP with 95deg of flexion. The IF has full extension and  65 of flexion at MCP joint. Hand is warm and well perfused with palpable  radial pulses and neurologic sensation intact to light touch distally.    Radiographs of the left shoulder reviewed by US demonstrate intact hardware,  well aligned without evidence of complications.    Radiographs of the left hand demonstrate mild thumb and index finger CMC joint  arthritis. There is moderate lateral and ring finger PIP joint space narrowing  and mild long finger PIP joint space narrowing, with associated osteophytosis  consistent with osteoarthritis. The joint spaces are otherwise preserved.  Index and long finger MCP joint space narrowing with hook osteophytes are  noted. Positive ulnar variance is appreciated.    Examination of the right hip, gait is antalgic, the skin is intact without  erythema, ecchymosis, or bony deformity. There is TTP over the trochanteric  bursa. Has full and symmetrical ROM. female, well-appearing, in no acute  distress. Negative Stinchfield. Negative SLR. Fires EHL/FHL/GS/TA. Neurologic  sensation intact to light touch distally. Lower extremities are warm and well  perfused with palpable DP and PT pulses.    Radiographs: X-rays of the right hip reviewed by [place], are negative for acute  fracture or dislocation. There is mild to moderate DJD.       **Assessments**    ---    1\. 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 Finger pain, left - M79.645    ---    3\. Trochanteric bursitis, right hip - M70.61    ---       **Treatment**    ---       **1\. Rotator cuff arthropathy of left shoulder**    Notes: The patient was seen and evaluated with Dr [name], who is over 14  months post left TSA. The shoulder is doing well, asymptomatic with  appropriate ROM. Radiographic and clinical findings were  discussed  with her.  Moving forward, we do not restrictions in regards to the shoulder and was  advised to continue with ROM and strengthening exercises as tolerated.    ---        **2\. 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 It appears the fall she experienced at the  end of December exacerbated her hand symptoms. Findings and diagnoses were  reviewed in depth with the patient. We had a discussion regarding treatment  options, including conservative and more aggressive measures. She is not ready  for surgery. Rheumatology is following her and her symptoms may sudside with  medication. In the meantime, we have provided coban wrap for edema control. We  will continue to monitor.        **3\. Trochanteric bursitis, right hip**    Notes: Symptoms, exam and imaging consistent with right hip trochanteric  bursitis. We explained the nature of this and the initial treatment which  includes ice, NSAIDS, and rest. We also offered a corticosteroid injection  which they accepted. Risks and benefits of the procedure were discussed and  include bleeding, infection of the joint, damage to surrounding structures,  failure to relieve symptoms, and need for futher injections or operations in  the future. Patient voiced understanding and wished to proceed. Patient  tolerated this well and had good lidocaine effect. A bandaid was applied. She  will f/u if symptoms fail to resolve or change in nature but will otherwise  follow up on a PRN basis.      **Procedures**    ---    After vernbal consent the lateral aspect of the right hip was prepped with  alcohol soaked gauze. Then a mixture of 3cc of 1%lidocaine and 2cc of  Celestone was injected into the greater troch bursa.    The needle contacted the the greater troch and was backed off 2-34mm so the  needle was in the region of the bursa. There was free flow of injectant.    The patient  tolerated the procedure well. They were advised to limit there  activities, apply ice packs and take NSAID's over the next 24-48hrs. They were  cautioned to monitor for evidence of infection.       **Procedure Codes**    ---       20610 Aspiration/Injection shoulder, hip, knee, subacromial bursa    ---    Z6109 Celestone    ---       **Follow Up**    ---    prn    **Appointment Provider:** Dorian Pod    Electronically signed by Yetta Numbers , MD on 06/18/2017 at 07:35 AM EDT    Sign off status: Completed        * * *        [facility]    [address]    [place], [address]    [place], [place] [address]    Tel: [phone]    Fax: [phone]              * * *          Patient: ARTINA, MINELLA DOB: Jul 07, 1960 Progress Note: Dorian Pod 05/18/2017    ---    Note generated by eClinicalWorks EMR/PM Software ([email or web address])

## 2017-05-18 NOTE — Progress Notes (Signed)
.  Progress Notes  .  Patient: [name], [name]  Provider: Dorian Pod    .  DOB: Oct 09, 1960 Age: 57 Y Sex: Female  Supervising Provider:: Yetta Numbers, MD  Date: 05/18/2017  .  PCP: [name]    Date: 05/18/2017  .  --------------------------------------------------------------------------------  .  REASON FOR APPOINTMENT  .  1. RT TROCH BURSITIS  .  2. s/p left reverse TSA, DOS 03/07/16  .  HISTORY OF PRESENT ILLNESS  .  GENERAL:   57yo female with PMHx of RA, Igg-4 deficiency (on IVIG  infusion), scleroderma and Raynaud's, followed by Rheumatology,  presents for follow up for above procedure and symptomatic left  hand arthritis. She reports the shoulder is doing great and she  is very happy with her surgery. In regards to the left hand, she  had a CSI to the IF MCP joint last visit, which provided good  symptom relief. She notes symptoms are not 100% but is happy with  the injection. She reports of a slip and fall that occurred in  03/2017, catching the side rail with her left hand. She felt pain  on RF the next day and has been sore since then, accompanied with  swelling about the entire PIP joint. Her worse symptom today are  located on the lateral aspect of the right hip, characterized as  dull ache with sharp pain at times, and does not radiate. Denies  numbness or paresthesias.  .  CURRENT MEDICATIONS  .  Taking Baclofen 10 MG Tablet 1/2 tab Orally prn  Taking Calcium Carbonate-Vitamin D 600-200 MG-UNIT Capsule 1  capsule with a meal Orally Twice a day  Taking GlipiZIDE XL 5 MG Tablet Extended Release 24 Hour 1 tablet  Orally Once a day  Taking Glucophage XR 500 mg Tablet Extended Release 24 Hour 4  tablets Orally Once a day  Taking Hydroxychloroquine Sulfate 200 mg Tablet 1 tablet with  food or milk Orally Once a day  Taking Levothyroxine Sodium 25 MCG Tablet 1 tablet Orally Once a  day  Taking Omeprazole 40 mg Capsule Delayed Release 1 capsule Orally  Once a day  Taking PredniSONE 10 MG Tablet 2 tablet  Orally Once a day  Taking Ranitidine HCl 150 mg Capsule 1 capsule at bedtime Orally  Once a day  Taking Rosuvastatin Calcium 10 MG Tablet 1 tablet Orally Once a  day  Taking Sulindac 150 mg Tablet 1 tablet with food Orally Twice a  day  Taking Tramadol HCl 50 MG Tablet 1 tablet Orally every 6 hrs  Not-Taking/PRN Humira Pen 40 MG/0.8ML Pen-injector Kit 0.8 ml  Subcutaneous every 2 weeks  Not-Taking/PRN Voltaren 1 % Gel 2 grams Transdermal twice daily,  Notes: PRN  Medication List reviewed and reconciled with the patient  .  PAST MEDICAL HISTORY  .  Scleroderma - CREST dx 2007  IgG4 deficiency s/p IVIG 2010 ----single infusion given  preventively after week of bilateral knee replacements at [facility]  2010  Fx left wrist in 1994  Blood clot at LUE in 2006 on a short course of Coumadin  Bilateral carpal tunnel syndrome s/p Lt carpal tunnel release and  steroids injection right--  Bone spur left foot  Scoliosis  Spinal stenosis s/p steroids injection  s/p bilateral knee replacements for valgus /arthritic  complications, performed by Dr. Katrinka Blazing 2010--- never infected, but  packed with antibiotics with surgery;  Right rotator cuff repairs x 4, complicated by repeat tears,  infection, placement of anchor material  Elevated liver function tests  Diabetes  .  ALLERGIES  .  N.K.D.A.  .  SURGICAL HISTORY  .  Right rotator cuff repair, with infected hardware that had to be  removed 2006  Repeat right shoulder surgery, also which became infected. 2007  Left rotator cuff repair 2008  bilateral knee replacements 2009  Left arthroscopic carpal tunnel release 01/2011  ORIF Left 4th metatarsal bone  Left Ulna shortening 1994  Knee replacement 2010  Hand surgery 2013, 2015  remove gallbladder/hernia 1990  Plate left foot 3rd metatarsal 2008  .  FAMILY HISTORY  .  Mother: deceased 22 yrs, lung cancer, hyperthyroidism, diagnosed  with Cancer  Father: deceased 8s yrs, heart attack, Heart Disease  3 brother(s) .  FH of arthritis and  scleroderma\nFather deceased from MI\nMother  deceased age 88 with lung cancer \\nBrother  with scleroderma \/  copd.  .  SOCIAL HISTORY  .  .  Tobacco  history: Never smoked.  .  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  .  Alcohol Former daily EtOH use in 20s.  .  Nonsmoker.Lives with longstanding boyfriend.  [name]  HOSPITALIZATION/MAJOR DIAGNOSTIC PROCEDURE  .  as above  .  REVIEW OF SYSTEMS  .  ORT:  .  Eyes    No . Ear, Nose Throat    No . Digestion, Stomach, Bowel     No . Bladder Problems    No . Bleeding Problems    No .  Numbness/Tingling    No . Anxiety/Depression    No .  Fever/Chills/Fatigue    No . Chest Pain/Tightness/Palpitations     No . Skin Rash    No . Dental Problems    No . Joint/Muscle  Pain/Cramps    Yes . Blackout/Fainting    No . Other    No .  .  VITAL SIGNS  .  Pain scale 7, Ht-in 62, Ht-cm 157.48.  [name]  PHYSICAL EXAMINATION  .  57yo female, well-appearing, in no acute distress. Examination of  the left shoulder reveals incision is well healed. There is no  erythema, edema, or bony deformity. There is no tenderness of the  shoulder. ROM: FF 0-180, ER 0-45, IR 0-30 degrees. Rotator cuff  takes good resistance. Examination of the left hand reveals skin  is clean and intact. There is swelling and tenderness of the RF  PIP joint. Pain with hand ROM and comes close to making a fist.  RF lacks 20deg of full extension at PIP with 95deg of flexion.  The IF has full extension and 65 of flexion at MCP joint. Hand is  warm and well perfused with palpable radial pulses and neurologic  sensation intact to light touch distally.Radiographs of the left  shoulder reviewed by US demonstrate intact hardware, well aligned  without evidence of complications. Radiographs of the left hand  demonstrate mild thumb and index finger CMC joint arthritis.  There is moderate lateral and ring finger PIP joint space  narrowing and mild long finger PIP joint space narrowing, with  associated osteophytosis consistent with  osteoarthritis. The  joint spaces are otherwise preserved. Index and long finger MCP  joint space narrowing with hook osteophytes are noted. Positive  ulnar variance is appreciated. Examination of the right hip, gait  is antalgic, the skin is intact without erythema, ecchymosis, or  bony deformity. There is TTP over the trochanteric bursa. Has  full and symmetrical ROM. female, well-appearing, in no acute  distress. Negative Stinchfield. Negative SLR. Fires  EHL/FHL/GS/TA. Neurologic sensation intact to light touch  distally. Lower extremities are warm and well perfused with  palpable DP and PT pulses.Radiographs: X-rays of the right hip  reviewed by [place], are negative for acute fracture or dislocation.  There is mild to moderate DJD.  [name]  ASSESSMENTS  .  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  Finger pain, left - M79.645  .  Trochanteric bursitis, right hip - M70.61  .  TREATMENT  .  Rotator cuff arthropathy of left shoulder  Notes: The patient was seen and evaluated with Dr [name], who is  over 14 months post left TSA. The shoulder is doing well,  asymptomatic with appropriate ROM. Radiographic and clinical  findings were discussed with her. Moving forward, we do not  restrictions in regards to the shoulder and was advised to  continue with ROM and strengthening exercises as tolerated.  .  .  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 It  appears the fall she experienced at the end of December  exacerbated her hand symptoms. Findings and diagnoses were  reviewed in depth with the patient. We had a discussion regarding  treatment options, including conservative and more aggressive  measures. She is not ready for surgery. Rheumatology is following  her and her symptoms may sudside with medication. In the  meantime, we have provided coban wrap for edema control. We will  continue to  monitor.  .  .  Trochanteric bursitis, right hip  Notes: Symptoms, exam and imaging consistent with right hip  trochanteric bursitis. We explained the nature of this and the  initial treatment which includes ice, NSAIDS, and rest. We also  offered a corticosteroid injection which they accepted. Risks and  benefits of the procedure were discussed and include bleeding,  infection of the joint, damage to surrounding structures, failure  to relieve symptoms, and need for futher injections or operations  in the future. Patient voiced understanding and wished to  proceed. Patient tolerated this well and had good lidocaine  effect. A bandaid was applied. She will f/u if symptoms fail to  resolve or change in nature but will otherwise follow up on a PRN  basis.  [name]  PROCEDURES  .  After vernbal consent the lateral aspect of the  right hip was prepped with alcohol soaked gauze. Then a mixture  of 3cc of 1%lidocaine and 2cc of Celestone was injected into the  greater troch bursa.The needle contacted the the greater troch  and was backed off 2-58mm so the needle was in the region of the  bursa. There was free flow of injectant.The patient tolerated the  procedure well. They were advised to limit there activities,  apply ice packs and take NSAID's over the next 24-48hrs. They  were cautioned to monitor for evidence of infection.  [name]  PROCEDURE CODES  .  20610 Aspiration/Injection shoulder, hip, knee, subacromial bursa  .  Z6109 Celestone  .  FOLLOW UP  .  prn  .  [name]  Appointment Provider: Dorian Pod  .  Electronically signed by Yetta Numbers , MD on  06/18/2017 at 07:35 AM EDT  .  CONFIRMATORY SIGN OFF  .  [name]  Document electronically signed by Dorian Pod    .

## 2017-05-31 ENCOUNTER — Ambulatory Visit

## 2017-06-15 ENCOUNTER — Ambulatory Visit

## 2017-06-15 ENCOUNTER — Ambulatory Visit: Admitting: Internal Medicine

## 2017-06-15 ENCOUNTER — Ambulatory Visit: Admit: 2017-06-15 | Payer: 59

## 2017-06-15 LAB — HX BF-CHEM/URINE
HX ALBUMIN RANDOM URINE: 5.3 mg/dL
HX ALBUMIN/CREATININE RATIO, URINE: 137 mg/g — ABNORMAL HIGH (ref 0–30)
HX CREATININE, RANDOM URINE: 38.64 mg/dL
HX MICROALBUMIN CALC: 0.14 mg/mg

## 2017-06-15 LAB — HX POINT OF CARE: HX HGB A1C, POC: 9.9 % — ABNORMAL HIGH

## 2017-06-15 LAB — HX DIABETES: HX ALBUMIN RANDOM URINE: 5.3 mg/dL

## 2017-06-15 MED ORDER — GABAPENTIN: 1 | 30 | 0 refills | 0 days | Status: AC

## 2017-06-15 MED ORDER — BACLOFEN: 0.5 | 20 | 0 refills | 0 days | Status: AC

## 2017-06-15 MED ORDER — DICLOFENAC SODIUM: 1 | 15 | 0 refills | 0 days | Status: AC

## 2017-06-15 MED ORDER — OXYCODONE HCL: 1 | 28 | 0 refills | 0 days | Status: AC

## 2017-06-15 NOTE — Telephone Encounter (Signed)
Call Details:   Patient PCP = [name], MD  Loralyn Freshwater  (Pharmacy) from [facility] called on June 15, 2017 1:46 PM.  Message taken by: Yoki Au  Primary call-back number: [phone]    Secondary call-back number: () -    Call Reason(s): Message/Call-Back      ** MESSAGE / CALL-BACK.  Regarding: Loralyn Freshwater is requesting for a clarification on the script instructions for Baclofen. Please contact pharmacy.    ---------- ---------- ---------- ---------- ---------- ----------       RESPONSE/ORDERS:  changed prescription to specify frequency as ONCE a day and changed dosing to 1/2 tablet rather than 1/2 to 1 tablet range. 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)  ANNUAL EXAM (ICD-V72.31) (ICD10-Z00.00)  DIABETES MELLITUS, TYPE II (ICD-250.00) (ICD10-E11.9)  LONG-TERM (CURRENT) USE OF STEROIDS (ICD-V58.65) (ICD10-Z79.51)  TRANSAMINASES, SERUM, ELEVATED (ICD-790.4) (ICD10-R74.0)  OBESITY (ICD-278.00) (ICD10-E66.9)      DIABETES MELLITUS, TYPE II, UNCONTROLLED (ICD-250.02) (ICD10-E11.65)      FATTY LIVER DISEASE (ICD-571.8) (ICD10-K76.0)  SCLERODERMA, 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)  HYPOTHYROIDISM (ICD-244.9) (ICD10-E03.9)  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) (ICD10-D80.8)  SPINAL STENOSIS, 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)  HEPATITIS C EXPOSURE (HCV RNA NEGATIVE, 01/2014) (ICD-V02.62) (ICD10-Z20.5)  PAIN IN JOINT, HAND (ICD-719.44) (ICD10-M79.643)  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)  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)  CARPAL TUNNEL (ICD-354.0) (ICD10-G56.00)  OSTEOARTHRITIS (ICD-715.09) (ICD10-M15.9)  S/P ROTATOR CUFF SURGERY 12/2004 - RT  SHOULDER; 2008 LT (ICD-V45.89)  Family Hx of MELANOMA, FAMILY HX (ICD-V16.8) (ICD10-Z80.8)  FRACTURE OF OTHER SPEC SITE,  PATHOLOGIC - MULTIPLE (ICD-733.19)  CHOLECYSTECTOMY AND HERNIA REPAIR (ICD-V45.89)  VITAMIN D DEFICIENCY (ICD-268.9) (ICD10-E55.9)  COLONOSCOPY, NEXT 2020- SEE COMMENT (ICD-V76.51) (ICD10-Z01.89)    Meds (prior to this call):   PREDNISONE 5 MG ORAL TABLET (PREDNISONE) Take 1 tablet by mouth once daily; Route: ORAL  LEVOTHYROXINE SODIUM 25 MCG ORAL TABLET (LEVOTHYROXINE SODIUM) Take one tablet by mouth once daily  OMEPRAZOLE 20 MG ORAL CAPSULE DELAYED RELEASE (OMEPRAZOLE) Take one capsule by mouth twice a day; Route: ORAL  IBUPROFEN 600 MG ORAL TABLET (IBUPROFEN) 1 tab by mouth TID as needed for pain  BACLOFEN 10 MG ORAL TABLET (BACLOFEN) Please take half to one tablet as needed for back spasms  DICLOFENAC SODIUM 1 % TRANSDERMAL GEL (DICLOFENAC SODIUM) APP 2 GRAMS EXT AA BID      FREESTYLE FREEDOM LITE w/Device KIT (BLOOD GLUCOSE MONITORING SUPPL) use as directed (ICD 10 E11.65)      FREESTYLE LITE TEST IN VITRO STRIP (GLUCOSE BLOOD) check fingerstick three times a day (ICD 10 E11.65); Route: IN VITRO      FREESTYLE LANCETS (LANCETS) check fingerstick three times a day (ICD 10 E11.65)  GLUCOPHAGE XR 500 MG ORAL TABLET EXTENDED RELEASE 24 HOUR (METFORMIN HCL) take four tablets by mouth daily every morning; Route: ORAL  ROSUVASTATIN CALCIUM 10 MG ORAL TABLET (ROSUVASTATIN CALCIUM) take one tablet by mouth daily; Route: ORAL  GLIPIZIDE XL 2.5 MG ORAL TABLET EXTENDED RELEASE 24 HOUR (GLIPIZIDE) Take one tablet by mouth once daily; Route: ORAL  TRAMADOL HCL 50 MG ORAL TABLET (TRAMADOL HCL) take one tablet daily as needed for pain; Route: ORAL  OXYCODONE HCL 5  MG ORAL TABLET (OXYCODONE HCL) Partial fill upon patient request.  Take one tablet daily as needed for pain >8; Route: ORAL  CALCIUM CARBONATE-VITAMIN D 600-200 MG-UNIT ORAL TABLET (CALCIUM CARBONATE-VITAMIN D) take one tablet twice a day; Route:  ORAL  VALACYCLOVIR HCL 1 GM ORAL TABLET (VALACYCLOVIR HCL) one tablet by mouth twice daily; Route: ORAL  GABAPENTIN 300 MG ORAL CAPSULE (GABAPENTIN) one capsule by mouth at bedtime as needed for pain; Route: ORAL  HYDROXYCHLOROQUINE SULFATE 200 MG ORAL TABLET (HYDROXYCHLOROQUINE SULFATE) one tablet oral daily; Route: ORAL            Created By Aleen Sells on 06/15/2017 at 01:46 PM    Electronically Signed By [name], MD on 06/15/2017 at 01:50 PM

## 2017-06-15 NOTE — Progress Notes (Signed)
General Medicine Visit  .  Service Due by Standard Protocol Rules: DIAB EYE EX, MICROALB/CRE, PATPORTA  LPIN, HGBA1C or HGBA1C%POC.    .  Initial Screening   * [place]: [place] ([place])  Ht: 62.5 in.  Wt: 191.4 lbs.   BMI: 34.57  with shoes  Temp: 98.2 deg F.     BP (Initial [place] Screening): 144 / 87      BP (Rechecked, Actionable): 115 /   80 mmHg   HR: 106     Health Mgmt materials? Weight Education Handout for high BMI  Travel outside of the [place] in past 28 days:: No  .  Med List: PRINTED byfor patient   Chronic Pain Assessment: Does pt experience chronic pain ? YES  Severity of pain? (min=0, max=10) 5  Smoking Assessment: Tobacco use? never smoker  HCP materials? Printed  .  [name]  Self Mgmt materials provided? Printed handout: Winter Respiratory Illnesses  .  Falls Risk Assessment:   In the past year, have you ... Had no falls  Difficulty with balance? YES  Need assistance with ambulation while here? NO  .  Comments: ........................................Marland KitchenVito Backers  June 15, 2017 12:56 PM  .  .  .  Chief Complaint:   MMP  .  History of Present Illness:   6F here for follow up of the following medical conditions.  .  Rheum - started IV IgG4 every 4 weeks with [name]. Has done it for 2 mo  nths so far.   .  Has lost 18 lbs. Has been watching her diet. Upset that her HbA1c is up, bu  t we think this may be due to her prednisone use. With her weight loss, she   is noticing skin sagging.   .  Seeing Dr. [name]. Got cortisone injection in her right hip for bursitis.   .  She is tired of being in pain. Tired of her medical conditions overall.  .  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 Had discontinued her gabape  ntin and needs it refilled. Wondering about Shingrix.  .  .  .  Past Medical History:  HEALTH CARE MAINTENANCE  STD- low risk, deferred  Pap smear - Negative co-testing in 2015. Next 2020.  Mammogram - normal 03/2017  Colonoscopy -  done 2016, repeat 2017 and both times poor prep but overall    has been sufficient for screening purposes.   In combination, these two col  onoscopies have likely given an adequate colon cancer screening. Recommend   repeat colonoscopy in 3 years.  1 polyp removed, next colonoscopy in 3 yrs   (2020). ***At that time in 2020, will do 7 days of daily miralax. Seven day  s low fiber diet. Two days clear liquids and a split prep with 2 dulcolax b  efore each dose of golytely.  DEXA (>60yr) - 2017 normal bone mass; on chronic prednisone use  Lung cancer screening (55-80)- N/A  Influenza - 01/2017  HPV - N/A  Hep C (4742-5956) - non reactive 2017  Zostavax - has not gotten vaccine yet; but got shingles in 2018 and she wil  l get the vaccine next year  Pneumonia - PPSV23 01/26/2017. Due for booster at age 89.  Tetanus and 11/04/2014  Contraception - post menopausal  LDL - 185   (08/05/2015)    HGBA1C - 8.7     (10/05/2016)  .  Family History: (reviewed)   Father: DMII, died of  MI at 20  Mom: died of lung CA at 58  MGM and PGM: lung ca (smokers)  MGF and several other second degree relatives: brain aneurysms in their 38'  s.  2 maternal uncles:melanoma (one died of unknown cancer, one living)  brother recently diagnosed with systemic scleroderma  .  Social History: (reviewed)   Non-smoker, rare etoh, no IVDU. 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 Living together with her husband of /R/20 yea  rs. Sexually active with one female partner husband only. No children. Always   wears a seat belt.  [name]  HEALTH MAINTENANCE  TDAP administered 11/04/14, next 2026  Mammogram ordered now (2017)  Pap smear next in 2018  DEXA 10/2015, WNL  RTC in the fall for influenza vaccine  Colonoscopy: done 2016, repeat 2017 and both times poor prep but overall ha  s been sufficient for screening purposes.  1 polyp removed, next colonoscop  y in 3 yrs (2020). At that time in 2020, will do 7 days of daily miralax. S  even days low fiber diet.   Two days clear liquids and a split prep  with 2 dulcolax before each dose   of golytely.  .  .  A complete ROS was done. All positive responses are listed in HPI section.   All other systems reviewed in detail and are negative.  .  .  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)  ANNUAL EXAM (ICD-V72.31) (ICD10-Z00.00)  DIABETES MELLITUS, TYPE II (ICD-250.00) (ICD10-E11.9)  LONG-TERM (CURRENT) USE OF STEROIDS (ICD-V58.65) (ICD10-Z79.51)  TRANSAMINASES, SERUM, ELEVATED (ICD-790.4) (ICD10-R74.0)  OBESITY (ICD-278.00) (ICD10-E66.9)      DIABETES MELLITUS, TYPE II, UNCONTROLLED (ICD-250.02) (ICD10-E11.65)      FATTY LIVER DISEASE (ICD-571.8) (ICD10-K76.0)  SCLERODERMA, 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)  HYPOTHYROIDISM (ICD-244.9) (ICD10-E03.9)  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) (ICD10-D  80.8)  SPINAL STENOSIS, 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)  HEPATITIS C EXPOSURE (HCV RNA NEGATIVE, 01/2014) (ICD-V02.62) (ICD10-Z20.5)  PAIN IN JOINT, HAND (ICD-719.44) (ICD10-M79.643)  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  Past Medical History (changes today):  Removed problem of PNEUMOCOCCAL 23-VALENT POLYSACCHARIDE VACCINATION GIVEN   (ICD-V03.82) (VVO16-W73)  Removed problem of ANNUAL EXAM (ICD-V72.31) (ICD10-Z00.00)  Added new  problem of SKIN SAGGING DUE TO WEIGHT LOSS (ICD-757.9) (XTG62-I94  .9)  Problems Reviewed:  Done  .  [name]  Medications (prior to today's visit):  PREDNISONE 5 MG ORAL TABLET (PREDNISONE) Take 1 tablet by mouth once daily;   Route: ORAL  LEVOTHYROXINE SODIUM 25 MCG ORAL TABLET (LEVOTHYROXINE SODIUM) Take one tab  let by mouth once daily  OMEPRAZOLE 20 MG ORAL CAPSULE DELAYED RELEASE (OMEPRAZOLE) Take one capsule   by mouth twice a day; Route: ORAL  IBUPROFEN 600 MG ORAL TABLET (IBUPROFEN) 1 tab by mouth TID as needed for p  ain  BACLOFEN 10 MG ORAL TABLET (BACLOFEN) Please take half to  one tablet as nee  ded for back spasms  DICLOFENAC SODIUM 1 % TRANSDERMAL GEL (DICLOFENAC SODIUM) APP 2 GRAMS EXT A  A BID; Route: TRANSDERMAL      FREESTYLE FREEDOM LITE w/Device KIT (BLOOD GLUCOSE MONITORING SUPPL) [place]  e as directed (ICD 10 E11.65)      FREESTYLE LITE TEST IN VITRO STRIP (GLUCOSE BLOOD) check fingerstick th  ree times a day (ICD 10 E11.65); Route: IN VITRO      FREESTYLE LANCETS (LANCETS) check fingerstick three times a day (ICD 10   E11.65)  GLUCOPHAGE XR 500 MG ORAL TABLET EXTENDED RELEASE 24 HOUR (METFORMIN HCL) t  ake four tablets by mouth daily every morning; Route: ORAL  ROSUVASTATIN CALCIUM 10 MG ORAL TABLET (ROSUVASTATIN CALCIUM) take one tabl  et by mouth daily; Route: ORAL  GLIPIZIDE XL 2.5 MG ORAL TABLET EXTENDED RELEASE 24 HOUR (GLIPIZIDE) Take o  ne tablet by mouth once daily; Route: ORAL  TRAMADOL HCL 50 MG ORAL TABLET (TRAMADOL HCL) take one tablet daily as need  ed for pain; Route: ORAL  OXYCODONE HCL 5 MG ORAL TABLET (OXYCODONE HCL) Partial fill upon patient re  quest.  Take one tablet daily as needed for pain >8; Route: ORAL  CALCIUM CARBONATE-VITAMIN D 600-200 MG-UNIT ORAL TABLET (CALCIUM CARBONATE-  VITAMIN D) take one tablet twice a day; Route: ORAL  VALACYCLOVIR HCL 1 GM ORAL TABLET (VALACYCLOVIR HCL) one tablet by mouth tw  ice daily; Route: ORAL  GABAPENTIN 300 MG ORAL CAPSULE (GABAPENTIN) one  capsule by mouth at bedtime   as needed for pain; Route: ORAL  HYDROXYCHLOROQUINE SULFATE 200 MG ORAL TABLET (HYDROXYCHLOROQUINE SULFATE)   one tablet oral daily; Route: ORAL  .  Medications (after today's visit):  PREDNISONE 10 MG ORAL TABLET (PREDNISONE) Take 1 tablet by mouth once daily  ; Route: ORAL  LEVOTHYROXINE SODIUM 25 MCG ORAL TABLET (LEVOTHYROXINE SODIUM) Take one tab  let by mouth once daily  OMEPRAZOLE 20 MG ORAL CAPSULE DELAYED RELEASE (OMEPRAZOLE) Take one capsule   by mouth twice a day; Route: ORAL  IBUPROFEN 600 MG ORAL TABLET (IBUPROFEN) 1 tab by mouth TID as needed for p  ain  BACLOFEN 10 MG ORAL TABLET (BACLOFEN) Please take half to one tablet as nee  ded for back spasms  DICLOFENAC SODIUM 1 % TRANSDERMAL GEL (DICLOFENAC SODIUM) APP 2 GRAMS EXT A  A BID      FREESTYLE FREEDOM LITE w/Device KIT (BLOOD GLUCOSE MONITORING SUPPL) [place]  e as directed (ICD 10 E11.65)      FREESTYLE LITE TEST IN VITRO STRIP (GLUCOSE BLOOD) check fingerstick th  ree times a day (ICD 10 E11.65); Route: IN VITRO      FREESTYLE LANCETS (LANCETS) check fingerstick three times a day (ICD 10   E11.65)  GLUCOPHAGE XR 500 MG ORAL TABLET EXTENDED RELEASE 24 HOUR (METFORMIN HCL) t  ake four tablets by mouth daily every morning; Route: ORAL  ROSUVASTATIN CALCIUM 10 MG ORAL TABLET (ROSUVASTATIN CALCIUM) take one tabl  et by mouth daily; Route: ORAL  GLIPIZIDE XL 2.5 MG ORAL TABLET EXTENDED RELEASE 24 HOUR (GLIPIZIDE) Take o  ne tablet by mouth once daily; Route: ORAL  TRAMADOL HCL 50 MG ORAL TABLET (TRAMADOL HCL) take one tablet daily as need  ed for pain; Route: ORAL  OXYCODONE HCL 5 MG ORAL TABLET (OXYCODONE HCL) Partial fill upon patient re  quest.  Take one tablet daily as needed for pain >8; Route: ORAL  CALCIUM CARBONATE-VITAMIN D 600-200 MG-UNIT  ORAL TABLET (CALCIUM CARBONATE-  VITAMIN D) take one tablet twice a day; Route: ORAL  GABAPENTIN 300 MG ORAL CAPSULE (GABAPENTIN) one capsule by mouth at bedtime   as needed for pain;  Route: ORAL  HYDROXYCHLOROQUINE SULFATE 200 MG ORAL TABLET (HYDROXYCHLOROQUINE SULFATE)   one tablet oral daily; Route: ORAL  .  Medications Reviewed:  Done  .  [name]  No Known Allergies  Allergies Reviewed:  Done  .  .  .  .  .  Vitals:   Ht: 62.5 in.  Wt: 191.4 lbs.  BMI (in-lb) 34.57  Temp: 98.2deg F.     BP (Initial [place] Screening): 144 / 87     BP (Rechecked, Actionable): 115 / 8  0 mmHg   Pulse Rate: 106 bpm  .  .  Additional PE:   Gen: Alert, NAD, well hydrated, well developed  Head: NCAT  CV: RRR, no M/R/G, no edema  Pulm: CTA b/l, resp effort wnl  Abd: soft, nontender, nondistended, no abdominal bruits, no hepatosplenomeg  aly  Skin: No rashes, no obvious bruising, no suspicious lesions  .  [name]  Assessment /T/ Plan:   DIABETES MELLITUS, TYPE II (ICD-250.00) (ICD10-E11.9)  - medications: glucophage, glipizide. Had hypoglycemia with glipizide when   increased to 5mg  dosing, so will avoid that. She likely needs an injectible  . Is hesitant to consider insulin. Will refer her back to endocrine to mana  ge her hyperglycemia likely due to increased prednisone dosing.  - BP control good  - diabetic eye appt - has it scheduled upcoming next month  - neuropathy/foot exam: sees podatrist regularly  - encourage diet/exercise  - LDL - 67   (01/26/2017)    - HGBA1C - 8.3     (01/26/2017)  hbA1c up to 9.3 today  - urine microalbumin - Not Calculated mg/g     (06/30/2016)   - return to clinic 3 months  .  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  LONG-TERM (CURRENT) USE OF STEROIDS (ICD-V58.65) (ICD10-Z79.51), SCLERODERM  A, 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) continue care with [name]. Chronic prednisone [place]  e is ongoing. DEXA 2017 showed normal bone mass. continue Ca+Vit D and omep  razole. Next DEXA 2019.  .  OBESITY (ICD-278.00) (ICD10-E66.9) Has lost 18 lbs since last visit. Congra  tulated her on weight loss efforts through diet!  [name]  SKIN SAGGING DUE TO WEIGHT LOSS (ICD-757.9) (UYQ03-K74.2)  Would like me  to note this in her medical chart  .  RTC for annual exam in October 2019./E/par   I spent 40 minutes with this patient, of which >30 minutes were spent in co  unseling and care coordination.  .  .  .  Orders (this visit):  Albumin, Random Urine  [CPT-82043]  [facility] ([facility] [Ref-Endo]  RTC in 6 months [RTC-180]  Est Level 4 [CPT-99214]  Medications:  DICLOFENAC SODIUM 1 % TRANSDERMAL GEL (DICLOFENAC SODIUM) APP 2 GRAMS EXT A  A BID  #15[Gram] x 0      Entered and Authorized by:  [name], MD      Signed by:  [name], MD on 06/15/2017      Method used:    Electronically to               [id number]* (retail)              [address]  [place], [place]  [id number]              Ph: [phone]              Fax: [phone]      Note to Pharmacy: Route: TRANS;       RxID:   [id number]  OXYCODONE HCL 5 MG ORAL TABLET (OXYCODONE HCL) Partial fill upon patient re  quest.  Take one tablet daily as needed for pain >8  #28 x 0      Route:ORAL      Entered and Authorized by:  [name], MD      Signed by:  [name], MD on 06/15/2017      Method used:    Print then Give to Patient      Note to Pharmacy: Route: ORAL;       RxID:   [id number]  GABAPENTIN 300 MG ORAL CAPSULE (GABAPENTIN) one capsule by mouth at bedtime   as needed for pain  #30[Capsule] x 0      Route:ORAL      Entered and Authorized by:  [name], MD      Signed by:  [name], MD on 06/15/2017      Method used:    Electronically to               [id number]* (retail)              [address]              [place], [place]  [id number]              Ph: [phone]              Fax: [phone]      Note to Pharmacy: Route: ORAL;       RxID:   [id number]  BACLOFEN 10 MG ORAL TABLET (BACLOFEN) Please take half to one tablet as nee  ded for back spasms  #20[Tablet] x 0      Entered and Authorized by:  [name], MD      Signed by:  [name], MD on 06/15/2017       Method used:    Electronically to               [id number]* (retail)              [address]              [place], [place]  [id number]              Ph: [phone]              Fax: [phone]      RxID:   [id number]  .  [name]  Patient Care Plan  .  [name]  Immunization Worksheet 2016   .  [name]  Electronically Signed by [name], MD on 06/15/2017 at 1:49 PM  ________________________________________________________________________

## 2017-06-15 NOTE — Progress Notes (Signed)
General Medicine Visit    Service Due by Standard Protocol Rules: DIAB EYE EX, MICROALB/CRE, PATPORTALPIN, HGBA1C or HGBA1C%POC.      Initial Screening   * [place]: [place] ([place])  Ht: 62.5 in.  Wt: 191.4 lbs.   BMI: 34.57  with shoes  Temp: 98.2 deg F.     BP (Initial [place] Screening): 144 / 87      BP (Rechecked, Actionable): 115 / 80 mmHg   HR: 106     Health Mgmt materials? Weight Education Handout for high BMI  Travel outside of the [place] in past 28 days:: No    Med List: PRINTED by  [facility] for patient   Chronic Pain Assessment: Does pt experience chronic pain ? YES  Severity of pain? (min=0, max=10) 5  Smoking Assessment: Tobacco use? never smoker  HCP materials? Printed      Self Mgmt materials provided? Printed handout: Winter Respiratory Illnesses    Falls Risk Assessment:   In the past year, have you ... Had no falls  Difficulty with balance? YES  Need assistance with ambulation while here? NO    Comments: ........................................Marland KitchenVito Backers  June 15, 2017 12:56 PM        Chief Complaint:   MMP    History of Present Illness:   57F here for follow up of the following medical conditions.    Rheum - started IV IgG4 every 4 weeks with [name]. Has done it for 2 months so far.     Has lost 18 lbs. Has been watching her diet. Upset that her HbA1c is up, but we think this may be due to her prednisone use. With her weight loss, she is noticing skin sagging.     Seeing Dr. [name]. Got cortisone injection in her right hip for bursitis.     She is tired of being in pain. Tired of her medical conditions overall.    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 Had discontinued her gabapentin and needs it refilled. Wondering about Shingrix.        Past Medical History:  HEALTH CARE MAINTENANCE  STD- low risk, deferred  Pap smear - Negative co-testing in 2015. Next 2020.  Mammogram - normal 03/2017  Colonoscopy -  done 2016, repeat 2017 - both times poor prep but overall has been sufficient for screening  purposes.   In combination, these two colonoscopies have likely given an adequate colon cancer screening. Recommend repeat colonoscopy in 3 years.  1 polyp removed, next colonoscopy in 3 yrs (2020). ***At that time in 2020, will do 7 days of daily miralax. Seven days low fiber diet. Two days clear liquids and a split prep with 2 dulcolax before each dose of golytely.  DEXA (>81yr) - 2017 normal bone mass; on chronic prednisone use  Lung cancer screening (55-80)- N/A  Influenza - 01/2017  HPV - N/A  Hep C (1610-9604) - non reactive 2017  Zostavax - has not gotten vaccine yet; but got shingles in 2018 and she will get the vaccine next year  Pneumonia - PPSV23 01/26/2017. Due for booster at age 65.  Tetanus - 11/04/2014  Contraception - post menopausal  LDL - 185   (08/05/2015)    HGBA1C - 8.7     (10/05/2016)    Family History: (reviewed)   Father: DMII, died of MI at 35  Mom: died of lung CA at 4  MGM and PGM: lung ca (smokers)  MGF and several other second degree relatives: brain aneurysms  in their 30's.  2 maternal uncles:melanoma (one died of unknown cancer, one living)  brother recently diagnosed with systemic scleroderma    Social History: (reviewed)   Non-smoker, rare etoh, no IVDU. 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 Living together with her husband of ~20 years. Sexually active with one female partner husband only. No children. Always wears a seat belt.    HEALTH MAINTENANCE  TDAP administered 11/04/14, next 2026  Mammogram ordered now (2017)  Pap smear next in 2018  DEXA 10/2015, WNL  RTC in the fall for influenza vaccine  Colonoscopy: done 2016, repeat 2017 - both times poor prep but overall has been sufficient for screening purposes.  1 polyp removed, next colonoscopy in 3 yrs (2020). At that time in 2020, will do 7 days of daily miralax. Seven days low fiber diet.   Two days clear liquids and a split prep with 2 dulcolax before each dose   of golytely.      A complete ROS  was done. All positive responses are listed in HPI section. All other systems reviewed in detail and are negative.      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)  ANNUAL EXAM (ICD-V72.31) (ICD10-Z00.00)  DIABETES MELLITUS, TYPE II (ICD-250.00) (ICD10-E11.9)  LONG-TERM (CURRENT) USE OF STEROIDS (ICD-V58.65) (ICD10-Z79.51)  TRANSAMINASES, SERUM, ELEVATED (ICD-790.4) (ICD10-R74.0)  OBESITY (ICD-278.00) (ICD10-E66.9)      DIABETES MELLITUS, TYPE II, UNCONTROLLED (ICD-250.02) (ICD10-E11.65)      FATTY LIVER DISEASE (ICD-571.8) (ICD10-K76.0)  SCLERODERMA, 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)  HYPOTHYROIDISM (ICD-244.9) (ICD10-E03.9)  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) (ICD10-D80.8)  SPINAL STENOSIS, 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)  HEPATITIS C EXPOSURE (HCV RNA NEGATIVE, 01/2014) (ICD-V02.62) (ICD10-Z20.5)  PAIN IN JOINT, HAND (ICD-719.44) (ICD10-M79.643)  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)  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)  CARPAL TUNNEL (ICD-354.0) (ICD10-G56.00)  OSTEOARTHRITIS (ICD-715.09) (ICD10-M15.9)  S/P ROTATOR CUFF SURGERY 12/2004 - RT SHOULDER; 2008 LT (ICD-V45.89)  Family Hx of MELANOMA, FAMILY HX (ICD-V16.8) (ICD10-Z80.8)  FRACTURE OF OTHER SPEC SITE,  PATHOLOGIC - MULTIPLE (ICD-733.19)  CHOLECYSTECTOMY AND HERNIA REPAIR (ICD-V45.89)  VITAMIN D DEFICIENCY (ICD-268.9) (ICD10-E55.9)  COLONOSCOPY, NEXT 2020- SEE COMMENT (ICD-V76.51) (ICD10-Z01.89)    Past Medical History (changes today):  Removed problem of PNEUMOCOCCAL 23-VALENT POLYSACCHARIDE VACCINATION GIVEN (ICD-V03.82) (QIO96-E95)  Removed problem of ANNUAL EXAM (ICD-V72.31) (ICD10-Z00.00)  Added new problem of SKIN SAGGING DUE TO WEIGHT LOSS (ICD-757.9) (MWU13-K44.0)     Problems  Reviewed:  Done      Medications (prior to today's visit):  PREDNISONE 5 MG ORAL TABLET (PREDNISONE) Take 1 tablet by mouth once daily; Route: ORAL  LEVOTHYROXINE SODIUM 25 MCG ORAL TABLET (LEVOTHYROXINE SODIUM) Take one tablet by mouth once daily  OMEPRAZOLE 20 MG ORAL CAPSULE DELAYED RELEASE (OMEPRAZOLE) Take one capsule by mouth twice a day; Route: ORAL  IBUPROFEN 600 MG ORAL TABLET (IBUPROFEN) 1 tab by mouth TID as needed for pain  BACLOFEN 10 MG ORAL TABLET (BACLOFEN) Please take half to one tablet as needed for back spasms  DICLOFENAC SODIUM 1 % TRANSDERMAL GEL (DICLOFENAC SODIUM) APP 2 GRAMS EXT AA BID; Route: TRANSDERMAL      FREESTYLE FREEDOM LITE w/Device KIT (BLOOD GLUCOSE MONITORING SUPPL) use as directed (ICD 10 E11.65)      FREESTYLE LITE TEST IN VITRO STRIP (GLUCOSE BLOOD)  check fingerstick three times a day (ICD 10 E11.65); Route: IN VITRO      FREESTYLE LANCETS (LANCETS) check fingerstick three times a day (ICD 10 E11.65)  GLUCOPHAGE XR 500 MG ORAL TABLET EXTENDED RELEASE 24 HOUR (METFORMIN HCL) take four tablets by mouth daily every morning; Route: ORAL  ROSUVASTATIN CALCIUM 10 MG ORAL TABLET (ROSUVASTATIN CALCIUM) take one tablet by mouth daily; Route: ORAL  GLIPIZIDE XL 2.5 MG ORAL TABLET EXTENDED RELEASE 24 HOUR (GLIPIZIDE) Take one tablet by mouth once daily; Route: ORAL  TRAMADOL HCL 50 MG ORAL TABLET (TRAMADOL HCL) take one tablet daily as needed for pain; Route: ORAL  OXYCODONE HCL 5 MG ORAL TABLET (OXYCODONE HCL) Partial fill upon patient request.  Take one tablet daily as needed for pain >8; Route: ORAL  CALCIUM CARBONATE-VITAMIN D 600-200 MG-UNIT ORAL TABLET (CALCIUM CARBONATE-VITAMIN D) take one tablet twice a day; Route: ORAL  VALACYCLOVIR HCL 1 GM ORAL TABLET (VALACYCLOVIR HCL) one tablet by mouth twice daily; Route: ORAL  GABAPENTIN 300 MG ORAL CAPSULE (GABAPENTIN) one capsule by mouth at bedtime as needed for pain; Route: ORAL  HYDROXYCHLOROQUINE SULFATE 200 MG ORAL TABLET  (HYDROXYCHLOROQUINE SULFATE) one tablet oral daily; Route: ORAL    Medications (after today's visit):  PREDNISONE 10 MG ORAL TABLET (PREDNISONE) Take 1 tablet by mouth once daily; Route: ORAL  LEVOTHYROXINE SODIUM 25 MCG ORAL TABLET (LEVOTHYROXINE SODIUM) Take one tablet by mouth once daily  OMEPRAZOLE 20 MG ORAL CAPSULE DELAYED RELEASE (OMEPRAZOLE) Take one capsule by mouth twice a day; Route: ORAL  IBUPROFEN 600 MG ORAL TABLET (IBUPROFEN) 1 tab by mouth TID as needed for pain  BACLOFEN 10 MG ORAL TABLET (BACLOFEN) Please take half to one tablet as needed for back spasms  DICLOFENAC SODIUM 1 % TRANSDERMAL GEL (DICLOFENAC SODIUM) APP 2 GRAMS EXT AA BID      FREESTYLE FREEDOM LITE w/Device KIT (BLOOD GLUCOSE MONITORING SUPPL) use as directed (ICD 10 E11.65)      FREESTYLE LITE TEST IN VITRO STRIP (GLUCOSE BLOOD) check fingerstick three times a day (ICD 10 E11.65); Route: IN VITRO      FREESTYLE LANCETS (LANCETS) check fingerstick three times a day (ICD 10 E11.65)  GLUCOPHAGE XR 500 MG ORAL TABLET EXTENDED RELEASE 24 HOUR (METFORMIN HCL) take four tablets by mouth daily every morning; Route: ORAL  ROSUVASTATIN CALCIUM 10 MG ORAL TABLET (ROSUVASTATIN CALCIUM) take one tablet by mouth daily; Route: ORAL  GLIPIZIDE XL 2.5 MG ORAL TABLET EXTENDED RELEASE 24 HOUR (GLIPIZIDE) Take one tablet by mouth once daily; Route: ORAL  TRAMADOL HCL 50 MG ORAL TABLET (TRAMADOL HCL) take one tablet daily as needed for pain; Route: ORAL  OXYCODONE HCL 5 MG ORAL TABLET (OXYCODONE HCL) Partial fill upon patient request.  Take one tablet daily as needed for pain >8; Route: ORAL  CALCIUM CARBONATE-VITAMIN D 600-200 MG-UNIT ORAL TABLET (CALCIUM CARBONATE-VITAMIN D) take one tablet twice a day; Route: ORAL  GABAPENTIN 300 MG ORAL CAPSULE (GABAPENTIN) one capsule by mouth at bedtime as needed for pain; Route: ORAL  HYDROXYCHLOROQUINE SULFATE 200 MG ORAL TABLET (HYDROXYCHLOROQUINE SULFATE) one tablet oral daily; Route: ORAL       Medications  Reviewed:  Done      No Known Allergies  Allergies Reviewed:  Done            Vitals:   Ht: 62.5 in.  Wt: 191.4 lbs.  BMI (in-lb) 34.57  Temp: 98.2deg F.     BP (Initial Coffee Screening): 144 / 87  BP (Rechecked, Actionable): 115 / 80 mmHg   Pulse Rate: 106 bpm      Additional PE:   Gen: Alert, NAD, well hydrated, well developed  Head: NCAT  CV: RRR, no M/R/G, no edema  Pulm: CTA b/l, resp effort wnl  Abd: soft, nontender, nondistended, no abdominal bruits, no hepatosplenomegaly  Skin: No rashes, no obvious bruising, no suspicious lesions      Assessment & Plan:   DIABETES MELLITUS, TYPE II (ICD-250.00) (ICD10-E11.9)  - medications: glucophage, glipizide. Had hypoglycemia with glipizide when increased to 5mg  dosing, so will avoid that. She likely needs an injectible. Is hesitant to consider insulin. Will refer her back to endocrine to manage her hyperglycemia likely due to increased prednisone dosing.  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. Chronic prednisone use is ongoing. DEXA 2017 showed normal bone mass. continue Ca+Vit D and omeprazole. Next DEXA 2019.    OBESITY (ICD-278.00) (ICD10-E66.9) Has lost 18 lbs since last visit. Congratulated her on weight loss efforts through diet!    SKIN SAGGING DUE TO WEIGHT LOSS (ICD-757.9) (ZOX09-U04.5)  Would like me to note this in her medical chart    RTC for annual exam in October 2019.\par   I spent 40 minutes with this patient, of which >30 minutes were spent in counseling and care  coordination.           Orders (this visit):  Albumin, Random Urine  [CPT-82043]  [facility] (B[facility] [Ref-Endo]  RTC in 6 months [RTC-180]  Est Level 4 [CPT-99214]  Medications:  DICLOFENAC SODIUM 1 % TRANSDERMAL GEL (DICLOFENAC SODIUM) APP 2 GRAMS EXT AA BID  #15[Gram] x 0   Entered and Authorized by: [name], MD   Signed by: [name], MD on 06/15/2017   Method used: Electronically to      [id number]* (retail)     [address]     [place], [place]  [id number]     Ph: [phone]     Fax: [phone]   Note to Pharmacy: Route: TRANS;    RxID: [id number]  OXYCODONE HCL 5 MG ORAL TABLET (OXYCODONE HCL) Partial fill upon patient request.  Take one tablet daily as needed for pain >8  #28 x 0   Route:ORAL   Entered and Authorized by: [name], MD   Signed by: [name], MD on 06/15/2017   Method used: Print then Give to Patient   Note to Pharmacy: Route: ORAL;    RxID: [id number]  GABAPENTIN 300 MG ORAL CAPSULE (GABAPENTIN) one capsule by mouth at bedtime as needed for pain  #30[Capsule] x 0   Route:ORAL   Entered and Authorized by: [name], MD   Signed by: [name], MD on 06/15/2017   Method used: Electronically to      [facility]* (retail)     [address]     [place], [place]  [id number]  Ph: [phone]     Fax: [phone]   Note to Pharmacy: Route: ORAL;    RxID: [id number]  BACLOFEN 10 MG ORAL TABLET (BACLOFEN) Please take half to one tablet as needed for back spasms  #20[Tablet] x 0   Entered and Authorized by: [name], MD   Signed by: [name], MD on 06/15/2017   Method used: Electronically to      [facility]* (retail)     [address]     [place], [place]  [address]     Ph: [phone]     Fax: [phone]   RxID: [id number]      Patient Care Plan      Immunization Worksheet 2016           Created By [name], MD on 06/15/2017 at 08:41 AM    Electronically Signed By [name], MD on 06/15/2017 at  01:49 PM

## 2017-06-18 ENCOUNTER — Ambulatory Visit

## 2017-06-18 MED ORDER — LISINOPRIL: 1 | 30 | 6 refills | 0 days | Status: AC

## 2017-06-18 NOTE — Telephone Encounter (Signed)
RESPONSE/ORDERS:  LMOM asking her to call back to discuss a medication. When she calls back, will let her know about proteinuria, and starting ACE-inhibitor. .......................................[name], MD  June 18, 2017 10:03 AM    Pt returning call. Requesting for a call back. Pt is at work so she will be available between 11:45 & 12:15. .......................................Bartholomew Crews  June 18, 2017 10:44 AM    called patient, informed her of proteinuria. Start lisinopril 2.5mg  daily. Eprescribed to her pharmacy. 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)  HYPOTHYROIDISM (ICD-244.9) (ICD10-E03.9)  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) (ICD10-D80.8)  SPINAL STENOSIS, 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)  HEPATITIS C EXPOSURE (HCV RNA NEGATIVE, 01/2014) (ICD-V02.62) (ICD10-Z20.5)  PAIN IN JOINT, HAND (ICD-719.44) (ICD10-M79.643)  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)  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)  CARPAL TUNNEL (ICD-354.0) (ICD10-G56.00)  OSTEOARTHRITIS (ICD-715.09) (ICD10-M15.9)  S/P ROTATOR CUFF SURGERY 12/2004 - RT SHOULDER; 2008 LT (ICD-V45.89)  Family Hx of MELANOMA, FAMILY HX (ICD-V16.8) (ICD10-Z80.8)  FRACTURE OF OTHER SPEC SITE,  PATHOLOGIC - MULTIPLE  (ICD-733.19)  CHOLECYSTECTOMY AND HERNIA REPAIR (ICD-V45.89)  VITAMIN D DEFICIENCY (ICD-268.9) (ICD10-E55.9)  COLONOSCOPY, NEXT 2020- SEE COMMENT (ICD-V76.51) (ICD10-Z01.89)    Meds (prior to this call):   PREDNISONE 10 MG ORAL TABLET (PREDNISONE) Take 1 tablet by mouth once daily; Route: ORAL  LEVOTHYROXINE SODIUM 25 MCG ORAL TABLET (LEVOTHYROXINE SODIUM) Take one tablet by mouth once daily  OMEPRAZOLE 20 MG ORAL CAPSULE DELAYED RELEASE (OMEPRAZOLE) Take one capsule by mouth twice a day; Route: ORAL  IBUPROFEN 600 MG ORAL TABLET (IBUPROFEN) 1 tab by mouth TID as needed for pain  BACLOFEN 10 MG ORAL TABLET (BACLOFEN) Please take half tablet once a day as needed for back spasms  DICLOFENAC SODIUM 1 % TRANSDERMAL GEL (DICLOFENAC SODIUM) APP 2 GRAMS EXT AA BID      FREESTYLE FREEDOM LITE w/Device KIT (BLOOD GLUCOSE MONITORING SUPPL) use as directed (ICD 10 E11.65)      FREESTYLE LITE TEST IN VITRO STRIP (GLUCOSE BLOOD) check fingerstick three times a day (ICD 10 E11.65); Route: IN VITRO      FREESTYLE LANCETS (LANCETS) check fingerstick three times a day (ICD 10 E11.65)  GLUCOPHAGE XR 500 MG ORAL TABLET EXTENDED RELEASE 24 HOUR (METFORMIN HCL) take four tablets by mouth daily every morning; Route: ORAL  ROSUVASTATIN CALCIUM 10 MG ORAL TABLET (ROSUVASTATIN CALCIUM) take one tablet by mouth daily; Route: ORAL  GLIPIZIDE XL 2.5 MG ORAL TABLET EXTENDED RELEASE 24 HOUR (GLIPIZIDE) Take one tablet by mouth once daily; Route: ORAL  TRAMADOL HCL 50 MG ORAL TABLET (TRAMADOL HCL) take one tablet daily as needed for pain; Route: ORAL  OXYCODONE HCL 5 MG ORAL TABLET (OXYCODONE HCL) Partial fill upon patient request.  Take one tablet daily as needed for pain >8; Route: ORAL  CALCIUM CARBONATE-VITAMIN D 600-200 MG-UNIT ORAL TABLET (CALCIUM CARBONATE-VITAMIN D) take one tablet  twice a day; Route: ORAL  GABAPENTIN 300 MG ORAL CAPSULE (GABAPENTIN) one capsule by mouth at bedtime as needed for pain; Route: ORAL  HYDROXYCHLOROQUINE  SULFATE 200 MG ORAL TABLET (HYDROXYCHLOROQUINE SULFATE) one tablet oral daily; Route: ORAL    Changes to Meds (this update):   Added new medication of LISINOPRIL 2.5 MG ORAL TABLET (LISINOPRIL) Take one tablet by mouth once daily; Route: ORAL - Signed  Rx of LISINOPRIL 2.5 MG ORAL TABLET (LISINOPRIL) Take one tablet by mouth once daily; Route: ORAL  #30[Tablet] x 6;  Signed;  Entered by: [name], MD;  Authorized by: [name], MD;  Method used: Electronically to [id number]*, [address] [place], [place]  [id number], Ph: [phone], Fax: [phone]; Note to Pharmacy: Route: ORAL;          Created By [name], MD on 06/18/2017 at 10:02 AM    Electronically Signed By [name], MD on 06/18/2017 at 12:33 PM

## 2017-06-22 ENCOUNTER — Ambulatory Visit

## 2017-06-22 ENCOUNTER — Ambulatory Visit: Admitting: Rheumatology

## 2017-06-22 NOTE — Telephone Encounter (Signed)
Call Details:   Patient PCP = [name] Bene, MD  [name]  from [facility] called on June 22, 2017 10:40 AM.  Message taken by: Bartholomew Crews  Primary call-back number: [phone]    Secondary call-back number: [phone] -    Call Reason(s): Message/Call-Back      ** MESSAGE / CALL-BACK.  Regarding: Requesting to speak to the pt's PCP regarding a newly found arrythmia. Please contact [name] regarding this matter    ---------- ---------- ---------- ---------- ---------- ----------       RESPONSE/ORDERS:  spoke with [name] from [facility] - she is at pts home now - pt is receiving IVIG per Rheum    [name] reports that pt has an irregular heart rate today...  advised that the pt should be seen as per visit note 3/15, her exam did not reveal abnl rhythym    she woudl like to speak with Dr Marlane Hatcher  will forward msg.......................................Marland KitchenClayborne Artist, RN  June 22, 2017 11:01 AM  ]    spoke to patient and [name]. Pt is asymptomatic - no palpitations, lightheadedness, chest pain. [name] notes irregularity in the rhythm, but a normal HR. Notes an "extra or missing beat." Advised she needs to be seen in urgent care, get labs, ekg etc. She will either be seen in UC here or the local UC and will get everything sent to me. She will call me to update me later. 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)  DIABETES MELLITUS, TYPE II (ICD-250.00) (ICD10-E11.9)  LONG-TERM (CURRENT) USE OF STEROIDS (ICD-V58.65) (ICD10-Z79.51)  SKIN SAGGING DUE TO WEIGHT LOSS (ICD-757.9) (ICD10-Q84.9)  TRANSAMINASES, SERUM, ELEVATED (ICD-790.4) (ICD10-R74.0)  OBESITY (ICD-278.00) (ICD10-E66.9)      DIABETES MELLITUS, TYPE II, UNCONTROLLED (ICD-250.02) (ICD10-E11.65)       FATTY LIVER DISEASE (ICD-571.8) (ICD10-K76.0)  SCLERODERMA, 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)  HYPOTHYROIDISM (ICD-244.9) (ICD10-E03.9)  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) (ICD10-D80.8)  SPINAL STENOSIS, 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)  HEPATITIS C EXPOSURE (HCV RNA NEGATIVE, 01/2014) (ICD-V02.62) (ICD10-Z20.5)  PAIN IN JOINT, HAND (ICD-719.44) (ICD10-M79.643)  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)  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)  CARPAL TUNNEL (ICD-354.0) (ICD10-G56.00)  OSTEOARTHRITIS (ICD-715.09) (ICD10-M15.9)  S/P ROTATOR CUFF SURGERY 12/2004 - RT SHOULDER; 2008 LT (ICD-V45.89)  Family Hx of MELANOMA, FAMILY HX (ICD-V16.8) (ICD10-Z80.8)  FRACTURE OF OTHER SPEC SITE,  PATHOLOGIC - MULTIPLE (ICD-733.19)  CHOLECYSTECTOMY AND HERNIA REPAIR (ICD-V45.89)  VITAMIN D DEFICIENCY (ICD-268.9) (ICD10-E55.9)  COLONOSCOPY, NEXT 2020- SEE COMMENT (ICD-V76.51) (ICD10-Z01.89)    Meds (prior to this call):   PREDNISONE 10 MG ORAL TABLET (PREDNISONE) Take 1 tablet by mouth once daily; Route: ORAL  LEVOTHYROXINE SODIUM 25 MCG ORAL TABLET (LEVOTHYROXINE SODIUM) Take one tablet by mouth once daily  OMEPRAZOLE 20 MG ORAL CAPSULE DELAYED RELEASE (OMEPRAZOLE) Take one capsule by mouth twice a day; Route: ORAL  IBUPROFEN 600 MG ORAL TABLET (IBUPROFEN) 1 tab by mouth TID as needed for pain  BACLOFEN 10 MG ORAL TABLET (BACLOFEN) Please take half tablet once a day as needed for back spasms  DICLOFENAC SODIUM 1 % TRANSDERMAL GEL (DICLOFENAC SODIUM) APP 2 GRAMS EXT AA BID      FREESTYLE FREEDOM LITE w/Device KIT (BLOOD GLUCOSE MONITORING SUPPL) use as directed (ICD 10 E11.65)  FREESTYLE LITE TEST IN VITRO STRIP (GLUCOSE BLOOD) check fingerstick three times a day (ICD 10 E11.65); Route: IN VITRO      FREESTYLE LANCETS (LANCETS) check fingerstick three times a day (ICD 10 E11.65)  GLUCOPHAGE XR 500 MG ORAL TABLET EXTENDED RELEASE 24 HOUR (METFORMIN HCL) take four tablets by mouth daily every  morning; Route: ORAL  ROSUVASTATIN CALCIUM 10 MG ORAL TABLET (ROSUVASTATIN CALCIUM) take one tablet by mouth daily; Route: ORAL  GLIPIZIDE XL 2.5 MG ORAL TABLET EXTENDED RELEASE 24 HOUR (GLIPIZIDE) Take one tablet by mouth once daily; Route: ORAL  TRAMADOL HCL 50 MG ORAL TABLET (TRAMADOL HCL) take one tablet daily as needed for pain; Route: ORAL  OXYCODONE HCL 5 MG ORAL TABLET (OXYCODONE HCL) Partial fill upon patient request.  Take one tablet daily as needed for pain >8; Route: ORAL  CALCIUM CARBONATE-VITAMIN D 600-200 MG-UNIT ORAL TABLET (CALCIUM CARBONATE-VITAMIN D) take one tablet twice a day; Route: ORAL  GABAPENTIN 300 MG ORAL CAPSULE (GABAPENTIN) one capsule by mouth at bedtime as needed for pain; Route: ORAL  HYDROXYCHLOROQUINE SULFATE 200 MG ORAL TABLET (HYDROXYCHLOROQUINE SULFATE) one tablet oral daily; Route: ORAL  LISINOPRIL 2.5 MG ORAL TABLET (LISINOPRIL) Take one tablet by mouth once daily; Route: ORAL            Created By Bartholomew Crews on 06/22/2017 at 10:40 AM    Electronically Signed By [name] Bene, MD on 06/22/2017 at 11:31 AM

## 2017-06-22 NOTE — Progress Notes (Signed)
* * *        **  [name]**    --- ---    74 Y old Female, DOB: 08-05-1960    [address] [place], [place] [address]    Home: [phone]    Provider: [name]        * * *    Telephone Encounter    ---    Answered by   Orson Slick  Date: 06/22/2017         Time: 11:02 AM    Reason   Dosing increase?    --- ---            Message                      Also she has new irregular heart beat prior to starting infusion. Nurse reached out to PCP and is waiting to hear back.  [name] from [facility]. [phone].             She is getting 20g every 4 weeks.       Should be getting 35g every 4 weeks based on 400mg /kg because she is 86kg, per [name].             If order changed, requests new order faxed.                Action Taken                      [name],[name]  06/22/2017 11:03:37 AM >       [name],[name] , [name] 06/22/2017 1:17:12 PM > Dosed is based on IBW, not ABW (pt is 62in, IBW ~50kg x400mg /kg = 20g). Discussed this with [name].                     * * *                ---          * * *          Patient: [name], [name] DOB: 1960-11-27 Provider: [name] 06/22/2017    ---    Note generated by eClinicalWorks EMR/PM Software ([email or web address])

## 2017-06-23 LAB — UNMAPPED LAB RESULTS: BUN (EXT): 14 mg/dL (ref 7–20)

## 2017-06-25 ENCOUNTER — Ambulatory Visit

## 2017-06-25 NOTE — Progress Notes (Signed)
Received EKG from last week from [facility] in [place]. EKG shows normal PR and QTc intervals. Some PVCs noted.      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)  HYPOTHYROIDISM (ICD-244.9) (ICD10-E03.9)  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) (ICD10-D80.8)  SPINAL STENOSIS, 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)  HEPATITIS C EXPOSURE (HCV RNA NEGATIVE, 01/2014) (ICD-V02.62) (ICD10-Z20.5)  PAIN IN JOINT, HAND (ICD-719.44) (ICD10-M79.643)  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)  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)  CARPAL TUNNEL (ICD-354.0) (ICD10-G56.00)  OSTEOARTHRITIS (ICD-715.09) (ICD10-M15.9)  S/P ROTATOR CUFF SURGERY 12/2004 - RT SHOULDER; 2008 LT (ICD-V45.89)  Family Hx of MELANOMA, FAMILY HX (ICD-V16.8) (ICD10-Z80.8)  FRACTURE OF OTHER SPEC SITE,  PATHOLOGIC - MULTIPLE (ICD-733.19)  CHOLECYSTECTOMY AND HERNIA REPAIR (ICD-V45.89)  VITAMIN D DEFICIENCY (ICD-268.9) (ICD10-E55.9)  COLONOSCOPY, NEXT 2020- SEE COMMENT (ICD-V76.51) (ICD10-Z01.89)    Meds (prior to this call):   PREDNISONE 10 MG ORAL TABLET (PREDNISONE) Take 1 tablet by mouth once daily; Route: ORAL  LEVOTHYROXINE SODIUM 25 MCG ORAL TABLET (LEVOTHYROXINE SODIUM) Take one tablet by mouth once daily  OMEPRAZOLE 20 MG ORAL CAPSULE DELAYED RELEASE (OMEPRAZOLE) Take one capsule by mouth twice a day; Route: ORAL  IBUPROFEN 600 MG ORAL TABLET (IBUPROFEN) 1 tab  by mouth TID as needed for pain  BACLOFEN 10 MG ORAL TABLET (BACLOFEN) Please take half tablet once a day as needed for back spasms  DICLOFENAC SODIUM 1 % TRANSDERMAL GEL (DICLOFENAC SODIUM) APP 2 GRAMS EXT AA BID      FREESTYLE FREEDOM LITE w/Device KIT (BLOOD GLUCOSE MONITORING SUPPL) use as directed (ICD 10 E11.65)      FREESTYLE LITE TEST IN VITRO STRIP (GLUCOSE BLOOD) check fingerstick three times a day (ICD 10 E11.65); Route: IN VITRO      FREESTYLE LANCETS (LANCETS) check fingerstick three times a day (ICD 10 E11.65)  GLUCOPHAGE XR 500 MG ORAL TABLET EXTENDED RELEASE 24 HOUR (METFORMIN HCL) take four tablets by mouth daily every morning; Route: ORAL  ROSUVASTATIN CALCIUM 10 MG ORAL TABLET (ROSUVASTATIN CALCIUM) take one tablet by mouth daily; Route: ORAL  GLIPIZIDE XL 2.5 MG ORAL TABLET EXTENDED RELEASE 24 HOUR (GLIPIZIDE) Take one tablet by mouth once daily; Route: ORAL  TRAMADOL HCL 50 MG ORAL TABLET (TRAMADOL HCL) take one tablet daily as needed for pain; Route: ORAL  OXYCODONE HCL 5 MG ORAL TABLET (OXYCODONE HCL) Partial fill upon patient request.  Take one tablet daily as needed for pain >8; Route: ORAL  CALCIUM CARBONATE-VITAMIN D 600-200 MG-UNIT ORAL TABLET (CALCIUM CARBONATE-VITAMIN D) take one tablet twice a day; Route: ORAL  GABAPENTIN 300 MG ORAL CAPSULE (GABAPENTIN) one capsule by mouth at bedtime as needed for pain; Route: ORAL  HYDROXYCHLOROQUINE SULFATE 200 MG ORAL TABLET (HYDROXYCHLOROQUINE SULFATE) one tablet oral daily; Route: ORAL  LISINOPRIL 2.5 MG ORAL TABLET (LISINOPRIL) Take one tablet by mouth once daily; Route: ORAL          Services Due: DIAB EYE EX, PATPORTALPIN.      Items recorded during this note:  Immunization Worksheet 2016         Save Prior PCP  This module is to save a prior PCP, to assist with future appointments.           Created By [name], MD on 06/25/2017 at 04:33 PM    Electronically Signed By [name], MD on 06/25/2017 at 04:33 PM

## 2017-07-02 ENCOUNTER — Ambulatory Visit

## 2017-07-03 ENCOUNTER — Ambulatory Visit

## 2017-07-21 LAB — UNMAPPED LAB RESULTS: BUN (EXT): 19 mg/dL (ref 7–20)

## 2017-07-21 LAB — HEPATITIS C ANTIBODY (EXT): HEPATITIS C ANTIBODY (EXT): NONREACTIVE

## 2017-07-23 ENCOUNTER — Ambulatory Visit

## 2017-08-21 ENCOUNTER — Ambulatory Visit

## 2017-09-07 ENCOUNTER — Ambulatory Visit: Admitting: Rheumatology

## 2017-09-07 NOTE — Progress Notes (Signed)
* * *        **  [name]**    --- ---    65 Y old Female, DOB: 06-Nov-1960    [address], [place], [place] [address]    Home: [phone]    Provider: [name]        * * *    Telephone Encounter    ---    Answered by   Arva Chafe  Date: 09/07/2017         Time: 10:42 AM    Reason   call back?    --- ---            Message                      [name] CMA from [facility], called asking for recent notes/labs for pt's IViG treatment [place]. There's nothing recent on file. PA expires in July. Insurance company was looking for something stating that the treatment is working. Call back [phone]. We do probably need to see the pt again. Last seen 02/2017, 2mon FU but looks like nothing was scheduled.                Action Taken                      can you help with this? [name] is backed up.      [name],[name] , [name] 09/10/2017 3:59:31 PM > Patient will need an appt so we can send updated notes for the PA (expires 09/30/2017). Please schedule appt (perhaps can see [name] on a Friday with [name] in clinic if [name] is booking out too far?) Thanks!       [name],[name]  09/11/2017 10:23:44 AM > transferred patient to call center to schedule appointment with [name] on a Friday.                    * * *                ---          * * *          PatientKimaria Struthers, Willene DOB: [birth year] Provider: [name] 09/07/2017    ---    Note generated by eClinicalWorks EMR/PM Software ([email or web address])

## 2017-09-10 ENCOUNTER — Ambulatory Visit

## 2017-09-10 NOTE — Telephone Encounter (Signed)
RESPONSE/ORDERS:  I see pt booked for 20 min on friday. can you make it 40 min? 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)  HYPOTHYROIDISM (ICD-244.9) (ICD10-E03.9)  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) (ICD10-D80.8)  SPINAL STENOSIS, 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)  HEPATITIS C EXPOSURE (HCV RNA NEGATIVE, 01/2014) (ICD-V02.62) (ICD10-Z20.5)  PAIN IN JOINT, HAND (ICD-719.44) (ICD10-M79.643)  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)  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)  CARPAL TUNNEL (ICD-354.0) (ICD10-G56.00)  OSTEOARTHRITIS (ICD-715.09) (ICD10-M15.9)  S/P ROTATOR CUFF SURGERY 12/2004 - RT SHOULDER; 2008 LT (ICD-V45.89)  Family Hx of MELANOMA, FAMILY HX (ICD-V16.8) (ICD10-Z80.8)  FRACTURE OF OTHER SPEC SITE,  PATHOLOGIC - MULTIPLE (ICD-733.19)  CHOLECYSTECTOMY AND HERNIA REPAIR (ICD-V45.89)  VITAMIN D DEFICIENCY (ICD-268.9) (ICD10-E55.9)  COLONOSCOPY, NEXT 2020- SEE COMMENT (ICD-V76.51) (ICD10-Z01.89)    Meds (prior to this call):   PREDNISONE 10 MG ORAL TABLET (PREDNISONE) Take 1 tablet by mouth once daily; Route: ORAL  LEVOTHYROXINE SODIUM 25 MCG ORAL TABLET (LEVOTHYROXINE  SODIUM) Take one tablet by mouth once daily  OMEPRAZOLE 20 MG ORAL CAPSULE DELAYED RELEASE (OMEPRAZOLE) Take one capsule by mouth twice a day; Route: ORAL  IBUPROFEN 600 MG ORAL TABLET (IBUPROFEN) 1 tab by mouth TID as needed for pain  BACLOFEN 10 MG ORAL TABLET (BACLOFEN) Please take half tablet once a day as needed for back spasms  DICLOFENAC SODIUM 1 % TRANSDERMAL GEL (DICLOFENAC SODIUM) APP 2 GRAMS EXT AA BID      FREESTYLE FREEDOM LITE w/Device KIT (BLOOD GLUCOSE MONITORING SUPPL) use as directed (ICD 10 E11.65)      FREESTYLE LITE TEST IN VITRO STRIP (GLUCOSE BLOOD) check fingerstick three times a day (ICD 10 E11.65); Route: IN VITRO      FREESTYLE LANCETS (LANCETS) check fingerstick three times a day (ICD 10 E11.65)  GLUCOPHAGE XR 500 MG ORAL TABLET EXTENDED RELEASE 24 HOUR (METFORMIN HCL) take four tablets by mouth daily every morning; Route: ORAL  ROSUVASTATIN CALCIUM 10 MG ORAL TABLET (ROSUVASTATIN CALCIUM) take one tablet by mouth daily; Route: ORAL  GLIPIZIDE XL 2.5 MG ORAL TABLET EXTENDED RELEASE 24 HOUR (GLIPIZIDE) Take one tablet by mouth once daily; Route: ORAL  TRAMADOL HCL 50 MG ORAL TABLET (TRAMADOL HCL) take one tablet daily as needed for pain; Route: ORAL  OXYCODONE HCL 5 MG ORAL TABLET (OXYCODONE HCL) Partial fill upon patient request.  Take one tablet daily as needed for pain >8; Route: ORAL  CALCIUM CARBONATE-VITAMIN D 600-200 MG-UNIT ORAL TABLET (CALCIUM CARBONATE-VITAMIN D) take one tablet twice a day; Route: ORAL  GABAPENTIN 300 MG ORAL CAPSULE (GABAPENTIN) one capsule by mouth at bedtime as needed for pain; Route: ORAL  HYDROXYCHLOROQUINE SULFATE 200 MG ORAL TABLET (HYDROXYCHLOROQUINE SULFATE) one tablet oral daily; Route: ORAL  LISINOPRIL 2.5 MG ORAL TABLET (  LISINOPRIL) Take one tablet by mouth once daily; Route: ORAL            Created By [name] Bene, MD on 09/10/2017 at 08:31 AM    Electronically Signed By [name] Bene, MD on 09/10/2017 at 09:31 AM

## 2017-09-14 ENCOUNTER — Ambulatory Visit

## 2017-09-14 ENCOUNTER — Ambulatory Visit: Admitting: Rheumatology

## 2017-09-14 ENCOUNTER — Ambulatory Visit: Admitting: Internal Medicine

## 2017-09-14 ENCOUNTER — Ambulatory Visit: Admit: 2017-09-14 | Payer: No Typology Code available for payment source

## 2017-09-14 LAB — HX IMMUNOLOGY
HX C-REACTIVE PROTEIN - CRP: 6.6 mg/L (ref 0.00–7.48)
HX IMMUNOGLOBULIN G: 1268 mg/dL (ref 540–1822)

## 2017-09-14 LAB — HX CHEM-PANELS
HX ANION GAP: 11 (ref 3–14)
HX BLOOD UREA NITROGEN: 18 mg/dL (ref 6–24)
HX CHLORIDE (CL): 99 meq/L (ref 98–110)
HX CO2: 23 meq/L (ref 20–30)
HX CREATININE (CR): 0.91 mg/dL (ref 0.57–1.30)
HX GFR, AFRICAN AMERICAN: 83 mL/min/{1.73_m2}
HX GFR, NON-AFRICAN AMERICAN: 71 mL/min/{1.73_m2}
HX POTASSIUM (K): 4.3 meq/L (ref 3.6–5.1)
HX SODIUM (NA): 133 meq/L — ABNORMAL LOW (ref 135–145)

## 2017-09-14 LAB — HX HEM-ROUTINE
HX BASO #: 0 10*3/uL (ref 0.0–0.2)
HX BASO: 0 %
HX EOSIN #: 0 10*3/uL (ref 0.0–0.5)
HX EOSIN: 0 %
HX HCT: 39.8 % (ref 32.0–45.0)
HX HGB: 13.3 g/dL (ref 11.0–15.0)
HX IMMATURE GRANULOCYTE#: 0.1 10*3/uL (ref 0.0–0.1)
HX IMMATURE GRANULOCYTE: 0 %
HX LYMPH #: 2 10*3/uL (ref 1.0–4.0)
HX LYMPH: 16 %
HX MCH: 31.2 pg (ref 26.0–34.0)
HX MCHC: 33.4 g/dL (ref 32.0–36.0)
HX MCV: 93.4 fL (ref 80.0–98.0)
HX MONO #: 0.7 10*3/uL (ref 0.2–0.8)
HX MONO: 6 %
HX MPV: 10.9 fL (ref 9.1–11.7)
HX NEUT #: 9.5 10*3/uL — ABNORMAL HIGH (ref 1.5–7.5)
HX NRBC #: 0 10*3/uL
HX NUCLEATED RBC: 0 %
HX PLT: 224 10*3/uL (ref 150–400)
HX RBC BLOOD COUNT: 4.26 M/uL (ref 3.70–5.00)
HX RDW: 13.2 % (ref 11.5–14.5)
HX SEG NEUT: 77 %
HX WBC: 12.4 10*3/uL — ABNORMAL HIGH (ref 4.0–11.0)

## 2017-09-14 LAB — HX CHEM-METABOLIC: HX THYROID STIMULATING HORMONE (TSH): 0.76 u[IU]/mL (ref 0.35–4.94)

## 2017-09-14 LAB — HX CHEM-LFT
HX ALANINE AMINOTRANSFERASE (ALT/SGPT): 126 IU/L — ABNORMAL HIGH (ref 0–54)
HX ALKALINE PHOSPHATASE (ALK): 445 IU/L — ABNORMAL HIGH (ref 40–130)
HX ASPARTATE AMINOTRANFERASE (AST/SGOT): 40 IU/L (ref 10–42)
HX BILIRUBIN, TOTAL: 1.4 mg/dL — ABNORMAL HIGH (ref 0.2–1.1)

## 2017-09-14 LAB — HX POINT OF CARE: HX HGB A1C, POC: 10 % — ABNORMAL HIGH

## 2017-09-14 LAB — HX HEM-MISC: HX SED RATE: 86 mm — ABNORMAL HIGH (ref 0–30)

## 2017-09-14 NOTE — Progress Notes (Signed)
.  Progress Notes  .  Patient: [name], [name]  Provider: Judy [name]  DOB: 15-Mar-1961 Age: 57 Y Sex: Female  .  PCP: [name]    Date: 09/14/2017  .  --------------------------------------------------------------------------------  .  REASON FOR APPOINTMENT  .  1. RA  .  HISTORY OF PRESENT ILLNESS  .  GENERAL:   [name] presents in follow up today for RA. Saw [name]  who injected L hand. She has felt some improvement in hand pain  since starting the infusions (today was 6th infusion).Aside from  hand pain: Periodic knee pain, bothered by the weather limited in  walking by achillesRight shoulder is getting worse Collarbone  aches all the time lower back pain - attributes to spinal  stenosisFollowing up with PCP today for afib issue, EKG  unremarkableI, [name], acted as Neurosurgeon for [name].  Signature below:[name] 09/14/17.  .  CURRENT MEDICATIONS  .  Taking Baclofen 10 MG Tablet 1/2 tab Orally prn  Taking Calcium Carbonate-Vitamin D 600-200 MG-UNIT Capsule 1  capsule with a meal Orally Twice a day  Taking GlipiZIDE XL 5 MG Tablet Extended Release 24 Hour 1 tablet  Orally Once a day  Taking Glucophage XR 500 mg Tablet Extended Release 24 Hour 4  tablets Orally Once a day  Taking Hydroxychloroquine Sulfate 200 mg Tablet 1 tablet with  food or milk Orally Once a day  Taking Levothyroxine Sodium 25 MCG Tablet 1 tablet Orally Once a  day  Taking Omeprazole 40 mg Capsule Delayed Release 1 capsule Orally  Once a day  Taking PredniSONE 10 MG Tablet 2 tablet Orally Once a day  Taking Ranitidine HCl 150 mg Capsule 1 capsule at bedtime Orally  Once a day  Taking Rosuvastatin Calcium 10 MG Tablet 1 tablet Orally Once a  day  Taking Sulindac 150 mg Tablet 1 tablet with food Orally Twice a  day  Taking Tramadol HCl 50 MG Tablet 1 tablet Orally every 6 hrs  Not-Taking/PRN Humira Pen 40 MG/0.8ML Pen-injector Kit 0.8 ml  Subcutaneous every 2 weeks  Not-Taking/PRN Voltaren 1 % Gel 2 grams  Transdermal twice daily,  Notes: PRN  Medication List reviewed and reconciled with the patient  .  PAST MEDICAL HISTORY  .  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  ALLERGIES  .  N.K.D.A.  .  SOCIAL HISTORY  .  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  .  Alcohol Former daily EtOH use in 20s.  .  Tobacco  history: Never smoked.  .  Nonsmoker.Lives with longstanding boyfriend.  [name]  REVIEW OF SYSTEMS  .  ADULT Rheumatology ROS :  .  Constitutional    No Recent weight gain, Fatigue, Chills . Eyes     No Pain, Loss of  vision, Itching eyes . HENT    No Headache  Ringing in ears Loss of hearing . Respiratory    No Shortness of  breath, Cough, Hemoptysis . Gastrointestinal    +Heartburn .  Genitourinary    No Difficult urination, Pus in urine, Blood in  urine . Musculoskeletal    +joint pain/ swelling, +muscle pain  .  Integumentary (skin and/or breast)    No Easy bruising, Rash, Sun  sensitive (sun allergy) . Neurological System    No numbness or  tingling, seizures, headache, weakness . Psychiatric    No  Excessive worries, Depression, Night sweats . Endocrine    No  Excessive thirst . Hematologic/Lymphatic    No Swollen glands,  Anemia, Lymphadenopathy . Allergic/Immunologic    No Frequent  sneezing, Increased susceptibility to infection, Raynauds .  Heart     No Chest Pain, Heart murmur, Irregular heart beat .  Cardiovascular System    +Swelling/ whitening of hands/feet in  cold  .  [name]  VITAL SIGNS  .  Pain scale 5.5, Ht-in 62, Wt-lbs 182, BMI 33.28, BP 113/78, HR  109.  [name]  EXAMINATION  .  Rheumatology:  General AppearanceAlert and oriented , No apparent distress .  HENT:No, alopecia .  Eyes:No, scleral icterus, scleral erythema .  ON:GEXBMWU rate rhythm .  XLK:GMWNU LE edema .  Skin:No, rash .  Psych:No anxiety depression.  MuskuloskeletalElbows, shoulders, hips, knees have intact ROM  with no synovitis. Hands have synovitis in the PIPs and MCPs  bilaterally..  Skin and NailsMinimal sclerodactyly.  synovitis in MCPs and PIPs bilaterally.  .  ASSESSMENTS  .  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  Long-term use of high-risk medication - Z79.899  .  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  She has ongoing synovitis however I think the IVIG has improved  her sxs somewhat as well as suppressed her Zoster. She needs  additional treatment and we spoke about adding another biologic,  either Humira or [place] wile she continues IVIG. She is  comfortable with this plan. She is due for repeat monitoring labs  and I will recheck her Ig levels as well. I will speak with our  pharmacist to consider next treatment options and FU with her in  1-2 weeks by phone. Otherwise I will see her back in 3 months for  further evaluation.  .  TREATMENT  .  Seronegative rheumatoid arthritis  LAB: Alkaline Phosphatase (ALK)  Alkaline Phosphatase (ALK)     445     (40 - 130 - IU/L)  .  [name]  LAB: Bilirubin, Total (TBIL)  Bilirubin, Total     1.4     (0.2 - 1.1 - mg/dL)  .  [name]  LAB: Aspartate aminotransferase (AST)  Aspartate Aminotranferase (AST/SGOT)     40     (10 - 42 - IU/L)  .  [name]  LAB: Alanine aminotransferase (ALT)  Alanine Aminotransferase (ALT/SGPT)     126     (0 - 54 - IU/L)  .  [name]  LAB: Blood Urea Nitrogen (BUN)  Blood Urea  Nitrogen     18     (6 - 24 - mg/dL)  .  [name]  LAB: Sed Rate ESR (ESR)  Sed Rate     86     (0 - 30 - mm)  .  [name]  LAB: Immunoglobulin G IgG (IGG)  Immunoglobulin G     1268     (540 - 1822 - mg/dL)  .  [name]  LAB: C-Reactive Protein (CRP)  C-Reactive Protein - CRP (Ultra-Wide Range)     6.60     (0.00 -  7.48 - mg/L)  .  [name]  LAB: IgG Subcls (IGGSB)  .  LAB: CBC/DIFF with PLT (CBCWD)  WBC     12.4     (4.0 - 11.0 - K/uL)  RBC     4.26     (3.70 - 5.00 - M/uL)  HGB     13.3     (11.0 - 15.0 - g/dL)  HCT     46.9     (62.9 - 45.0 - %)  MCV     93.4     (80.0 - 98.0 - fL)  MCH     31.2     (26.0 - 34.0 - pg)  MCHC     33.4     (32.0 - 36.0 - g/dL)  RDW     52.8     (41.3 - 14.5 - %)  PLT     224     (150 - 400 - K/uL)  MPV     10.9     (9.1 - 11.7 - fL)  SEG NEUT     77     ( - %)  LYMPH     16     ( - %)  MONO     6     ( - %)  EOS     0     ( - %)  BASO     0     ( - %)  NEUT #     9.5     (1.5 - 7.5 - K/uL)  LYMPH #     2.0     (1.0 - 4.0 - K/uL)  MONO #     0.7     (0.2 - 0.8 - K/uL)  EOSIN #     0.0     (0.0 - 0.5 - K/uL)  BASO #     0.0     (0.0 - 0.2 - K/uL)  Imm Grnas     0     ( - %)  NRBC     0     ( - %)  Imm Grans, Abs     0.1     (0.0 - 0.1 - K/uL)  NRBC, Abs     0.0     (<0.0 - K/uL)  .  [name]  LAB: Electrolytes (Na, K, Cl, CO2) LYTES  ANION GAP     11     (3 - 14 - )  CL     99     (98 - 110 - mEq/L)  CO2     23     (20 - 30 - mEq/L)  K     4.3     (3.6 - 5.1 - mEq/L)  NA     133     (135 - 145 - mEq/L)  .  [name]  LAB: Creatinine (CR)  Creatinine (CR)     0.91     (0.57 - 1.30 - mg/dL)  .  FOLLOW UP  .  3 Months  .  Electronically signed by Erasmo Leventhal , MD on  09/16/2017 at 06:11 PM EDT  .  Document electronically signed by [name]

## 2017-09-14 NOTE — Progress Notes (Signed)
General Medicine Visit  .  Service Due by Standard Protocol Rules: DIAB EYE EX, PATPORTALPIN, HGBA1C o  r HGBA1C%POC.    .  Initial Screening   * [name]  Ht: 62.5 in.  Wt: 183.2 lbs.   BMI: 33.09  Temp: 98.6 deg F.     BP (Initial [place] Screening): 122 / 80      BP (Rechecked, Actionable): 122 /   80 mmHg   HR: 98     .  Med List: PRINTED by [place] for patient   Chronic Pain Assessment: Does pt experience chronic pain ? YES  Severity of pain? (min=0, max=10) 5  Smoking Assessment: Tobacco use? never smoker  .  Self Mgmt materials provided? Printed handout: Seasonal Allergies  .  [name]  Falls Risk Assessment:   In the past year, have you ... Had no falls  Difficulty with balance? NO  Need assistance with ambulation while here? NO  .  Behavioral Health Tools:   PHQ2 Screening Score 0  .  Comments: ........................................Marland KitchenVito Backers  June 14, 2  019 2:17 PM  .  .  .  Chief Complaint:   f/up MMP  .  History of Present Illness:   77F here for f/up MMP  .  Nurse noted she's been in afib, paroxysmally. Happens prior to infusions. D  oes not feel it when it happens. Her nurse did not get an EKG at the time.   She did go to an urgent care center the first time, but that EKG did not ge  t sent to me.   .  Rheum -IV IgG4 infusion was done today with [name]. He wants to start h  er on Humira only AFTER she's gotten Shingrix.   .  HbA1c is 10 today; last visit it was 9.9. Still on 10mg  prednisone daily.   Has lost 8 lbs since last visit. Has been watching her diet.  Has not seen   endocrinology.  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  .  .  .  Past Medical History:(reviewed)  HEALTH CARE MAINTENANCE  STD- low risk, deferred  Pap smear - Negative co-testing in 2015. Next 2020.  Mammogram - normal 03/2017  Colonoscopy -  done 2016, repeat 2017 and both times poor prep but overall   has been sufficient for screening purposes.   In combination, these two col  onoscopies have likely given an adequate  colon cancer screening. Recommend   repeat colonoscopy in 3 years.  1 polyp removed, next colonoscopy in 3 yrs   (2020). ***At that time in 2020, will do 7 days of daily miralax. Seven day  s low fiber diet. Two days clear liquids and a split prep with 2 dulcolax b  efore each dose of golytely.  DEXA (>66yr) - 2017 normal bone mass; on chronic prednisone use. Surveillan  ce due 2019 at annual exam  Lung cancer screening (55-80)- N/A  Influenza - 01/2017  HPV - N/A  Hep C (2355-7322) - non reactive 2017  Shingles shot - recommended she get Shingrix when/where it's available  Pneumonia - PPSV23 01/26/2017. Due for booster at age 54.  Tetanus and TDAP 11/04/2014  Contraception - post menopausal  LDL - 185   (08/05/2015)    HGBA1C - 8.7     (10/05/2016)  .  Family History: (reviewed)   Father: DMII, died of MI at 73  Mom: died of lung CA at 72  MGM and PGM:  lung ca (smokers)  MGF and several other second degree relatives: brain aneurysms in their 62'  s.  2 maternal uncles:melanoma (one died of unknown cancer, one living)  brother recently diagnosed with systemic scleroderma  .  Social History: (reviewed)   Non-smoker, rare etoh, no IVDU. 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 Living together with her lifelong partner of   /R/20 years - may be getting legally married to him soon. Sexually active w  ith husband only. No children. Always wears a seat belt.  .  .  A complete ROS was done. All positive responses are listed in HPI section.   All other systems reviewed in detail and are negative.  .  .  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)  DIABETES MELLITUS, TYPE II (ICD-250.00) (ICD10-E11.9)  LONG-TERM (CURRENT) USE OF STEROIDS (ICD-V58.65) (ICD10-Z79.51)  SKIN SAGGING DUE TO WEIGHT LOSS (ICD-757.9) (ICD10-Q84.9)  TRANSAMINASES, SERUM,  ELEVATED (ICD-790.4) (ICD10-R74.0)  OBESITY (ICD-278.00) (ICD10-E66.9)      DIABETES MELLITUS, TYPE II, UNCONTROLLED (ICD-250.02) (ICD10-E11.65)      FATTY LIVER DISEASE (ICD-571.8) (ICD10-K76.0)  SCLERODERMA, 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)  HYPOTHYROIDISM (ICD-244.9) (ICD10-E03.9)  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) (ICD10-D  80.8)  SPINAL STENOSIS, 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)  HEPATITIS C EXPOSURE (HCV RNA NEGATIVE, 01/2014) (ICD-V02.62) (ICD10-Z20.5)  PAIN IN JOINT, HAND (ICD-719.44) (ICD10-M79.643)  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  Past Medical History (changes today):  Added new problem of ? OF ATRIAL FIBRILLATION, PAROXYSMAL (ICD-427.31) (ICD  10-I48.0)  Problems Reviewed:  Done  .  [name]  Medications (prior to today's visit):  ROSUVASTATIN CALCIUM 10 MG ORAL TABLET (ROSUVASTATIN CALCIUM) take one tabl  et by mouth daily; Route: ORAL  LISINOPRIL 2.5 MG ORAL TABLET (LISINOPRIL) Take one tablet by mouth once da  ily; Route: ORAL  GLUCOPHAGE XR 500 MG ORAL TABLET EXTENDED RELEASE 24 HOUR (METFORMIN HCL) t  ake four tablets by mouth daily every morning; Route: ORAL  GLIPIZIDE XL 2.5 MG ORAL TABLET EXTENDED RELEASE 24 HOUR (GLIPIZIDE) Take o  ne tablet by mouth once daily; Route: ORAL  PREDNISONE 10 MG ORAL TABLET (PREDNISONE) Take 1 tablet by mouth once daily  ; Route: ORAL  LEVOTHYROXINE SODIUM 25 MCG ORAL TABLET (LEVOTHYROXINE SODIUM) Take one tab  let by mouth once daily  OMEPRAZOLE 20  MG ORAL CAPSULE DELAYED RELEASE (OMEPRAZOLE) Take one capsule   by mouth twice a day; Route: ORAL  IBUPROFEN 600 MG ORAL TABLET (IBUPROFEN) 1 tab by mouth TID as needed for p  ain  BACLOFEN 10 MG ORAL TABLET (BACLOFEN) Please take half tablet once a day as   needed for back spasms  DICLOFENAC SODIUM 1 % TRANSDERMAL GEL (DICLOFENAC SODIUM) APP 2 GRAMS EXT A  A BID      FREESTYLE FREEDOM LITE w/Device KIT (BLOOD GLUCOSE MONITORING SUPPL) [place]  e as directed (ICD 10 E11.65)      FREESTYLE LITE TEST  IN VITRO STRIP (GLUCOSE BLOOD) check fingerstick th  ree times a day (ICD 10 E11.65); Route: IN VITRO      FREESTYLE LANCETS (LANCETS) check fingerstick three times a day (ICD 10   E11.65)  TRAMADOL HCL 50 MG ORAL TABLET (TRAMADOL HCL) take one tablet daily as need  ed for pain; Route: ORAL  OXYCODONE HCL 5 MG ORAL TABLET (OXYCODONE HCL) Partial fill upon patient re  quest.  Take one tablet daily as needed for pain >8; Route: ORAL  CALCIUM CARBONATE-VITAMIN D 600-200 MG-UNIT ORAL TABLET (CALCIUM CARBONATE-  VITAMIN D) take one tablet twice a day; Route: ORAL  GABAPENTIN 300 MG ORAL CAPSULE (GABAPENTIN) one capsule by mouth at bedtime   as needed for pain; Route: ORAL  HYDROXYCHLOROQUINE SULFATE 200 MG ORAL TABLET (HYDROXYCHLOROQUINE SULFATE)   one tablet oral daily; Route: ORAL  .  No Changes to Medication List   Medications Reviewed:  Done  .  [name]  No Known Allergies  Allergies Reviewed:  Done  .  .  .  .  .  Vitals:   Ht: 62.5 in.  Wt: 183.2 lbs.  BMI (in-lb) 33.09  Temp: 98.6deg F.     BP (Initial [place] Screening): 122 / 80     BP (Rechecked, Actionable): 122 / 8  0 mmHg   Pulse Rate: 98 bpm  .  .  Additional PE:   Gen: Alert, NAD, well hydrated, obese  Neck: Supple, no ant/post cervical or supraclavicular lymphadenopathy  CV: RRR, no M/R/G, no edema  Pulm: CTA b/l, resp effort wnl  .  [name]  Assessment /T/ Plan:   Question of paroxysmal AFIB:  in sinus rhythm today. CHADsVasc score would be 2., if this rhythm she was   noted to  have was in fact afib. Will need to get the EKG that shows it from   the urgent care center she went to. Also discussed cardiac monitor to catc  h the abnormal rhythm.  (score 2 or greater is moderate-high risk and should otherwise be an antico  agulation candidate.)  Check TSH, electrolytes  Ordered echo  [facility]erred [facility]  .  DIABETES MELLITUS, TYPE II (ICD-250.00) (ICD10-E11.9)  - medications: glucophage, glipizide. Had hypoglycemia with glipizide when   increased to 5mg  dosing, so will avoid that. She likely needs an injectible  . Still reluctant to consider insulin. Will refer her back to endocrine to   manage her hyperglycemia likely due to increased prednisone dosing. Strongl  y encouraged her to make endocrine appt -- she has not gone back in a while  . Emphasized the cardiovascular risk that having diabetes imposes.  - BP control good  - diabetic eye appt   - neuropathy/foot exam: sees podatrist regularly  - encourage diet/exercise  - LDL - 67   (01/26/2017)    - HGBA1C - 10 on 09/14/2017  - urine microalbumin - Not Calculated mg/g     (06/30/2016)   - return to clinic 3 months  .  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 Recommen  ded Shingrix.  [name]  HYPOTHYROIDISM: continue levothyroxine. Check TSH  .  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. Ch  ronic prednisone use is ongoing. DEXA 2017 showed normal bone mass. continu  e Ca+Vit D and omeprazole. Next DEXA 2019 at annual exam  .  OBESITY (ICD-278.00) (ICD10-E66.9) Has lost 8 more pounds since last visit.  Congratulated her on weight loss efforts through diet!  [name]  RTC for annual exam in the fall.  I spent 40 minutes with this patient, of which >30 minutes were spent in co  unseling and care coordination.   .  .  Orders (this visit):  [facility], Arrhythmia [Ref-Cardio]  Echo 2D Adult (Complete TTE)  [CPT-93320]  TSH Reflex [CPT-84439]  [facility] ([facility] [Ref-Endo]  Request for Medical Records [MSC-00000]  RTC in 6 months [RTC-180]  Est Level 4 [CPT-99214]  .  Patient Care Plan  .  [name]  Immunization Worksheet 2016   .  [name]  Electronically Signed by [name], MD on 09/14/2017 at 3:10 PM  ________________________________________________________________________

## 2017-09-14 NOTE — Progress Notes (Signed)
* * *        **  [name]**    --- ---    30 Y old Female, DOB: [birth year]    [address], [place], [place] [address]    Home: [phone]    Provider: [name]        * * *    Telephone Encounter    ---    Answered by   Orson Slick  Date: 09/14/2017         Time: 03:44 PM    Reason   Fax note    --- ---            Dennison Mascot [phone]      Phone: [phone]                Action Taken                      [name],[name]  09/14/2017 3:44:30 PM >       [name],[name]  09/17/2017 9:37:02 AM > faxed note. LVM for [name]                    * * *                ---          * * *          Patient: [name], [name] DOB: [birth year] Provider: [name] 09/14/2017    ---    Note generated by eClinicalWorks EMR/PM Software ([email or web address])

## 2017-09-14 NOTE — Progress Notes (Signed)
* * *        [name]**    --- ---    80 Y old Female, DOB: 1960-09-19, External MRN: [id number]    Account Number: [id number]    [address], [id number]    Home: [phone]    Guarantor: [name] Insurance: [id number] PPO    PCP: [name] Referring: [name]    Appointment Facility: Rheumatology, Allergy and Immunology        * * *    09/14/2017  Progress Notes: [name] Pimple, MD **CHN#:** [phone] [phone]    --- ---    ---         **Reason for Appointment**    ---       1\. RA    ---       **History of Present Illness**    ---     _GENERAL_ :    [name] presents in follow up today for RA. Saw [name] who injected L  hand. She has felt some improvement in hand pain since starting the infusions  (today was 6th infusion).    Aside from hand pain:    Periodic knee pain, bothered by the weather    limited in walking by achilles    Right shoulder is getting worse    Collarbone aches all the time    lower back pain - attributes to spinal stenosis    Following up with PCP today for afib issue, EKG unremarkable    I, [name], acted as Neurosurgeon for [name]. Signature below:    [name], Scribe 09/14/17.       **Current Medications**    ---    Taking     * Baclofen 10 MG Tablet 1/2 tab Orally prn    ---    * Calcium Carbonate-Vitamin D 600-200 MG-UNIT Capsule 1 capsule with a meal Orally Twice a day    ---    * GlipiZIDE XL 5 MG Tablet Extended Release 24 Hour 1 tablet Orally Once a day    ---    * Glucophage XR 500 mg Tablet Extended Release 24 Hour 4 tablets Orally Once a day    ---    * Hydroxychloroquine Sulfate 200 mg Tablet 1 tablet with food or milk Orally Once a day    ---    * Levothyroxine Sodium 25 MCG Tablet 1 tablet Orally Once a day    ---    * Omeprazole 40 mg Capsule Delayed Release 1 capsule Orally Once a day    ---    * PredniSONE 10 MG Tablet 2 tablet Orally Once a day    ---    * Ranitidine HCl 150 mg Capsule 1 capsule at bedtime Orally Once a day    ---    *  Rosuvastatin Calcium 10 MG Tablet 1 tablet Orally Once a day    ---    * Sulindac 150 mg Tablet 1 tablet with food Orally Twice a day    ---    * Tramadol HCl 50 MG Tablet 1 tablet Orally every 6 hrs    ---    Not-Taking/PRN    * Humira Pen 40 MG/0.8ML Pen-injector Kit 0.8 ml Subcutaneous every 2 weeks    ---    * Voltaren 1 % Gel 2 grams Transdermal twice daily, Notes: PRN    ---    * Medication List reviewed and reconciled with the patient    ---       **  Past Medical History**    ---       Scleroderma - CREST dx 2007.        ---    IgG4 deficiency s/p IVIG 2010 ----single infusion given preventively after  week of bilateral knee replacements at [facility] 2010.        ---    Fx left wrist in 1994.        ---    Blood clot at LUE in 2006 on a short course of Coumadin.        ---    Bilateral carpal tunnel syndrome s/p Lt carpal tunnel release and steroids  injection right--.        ---    Bone spur left foot.        ---    Scoliosis.        ---    Spinal stenosis s/p steroids injection.        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 Katrinka Blazing 2010--- never infected, but packed with antibiotics with  surgery;.        ---    Right rotator cuff repairs x 4, complicated by repeat tears, infection,  placement of anchor material.        ---    Elevated liver function tests.        ---    Diabetes.        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    Alcohol  Former daily EtOH use in 20s.    Tobacco history: Never smoked.   Nonsmoker.    Lives with longstanding boyfriend.    ---       **Allergies**    ---       N.K.D.A.    ---       **Review of Systems**    ---     _ADULT Rheumatology ROS_ :    Constitutional No Recent weight gain, Fatigue, Chills. Eyes No Pain, Loss of  vision, Itching eyes. HENT No Headache Ringing in ears Loss of hearing.  Respiratory No Shortness of breath, Cough, Hemoptysis. Gastrointestinal  **+Heartburn**.  Genitourinary No Difficult urination, Pus in urine, Blood in  urine. Musculoskeletal **+joint pain/ swelling, +muscle pain**. Integumentary  (skin and/or breast) No Easy bruising, Rash, Sun sensitive (sun allergy).  Neurological System No numbness or tingling, seizures, headache, weakness.  Psychiatric No Excessive worries, Depression, Night sweats. Endocrine No  Excessive thirst. Hematologic/Lymphatic No Swollen glands, Anemia,  Lymphadenopathy. Allergic/Immunologic No Frequent sneezing, Increased  susceptibility to infection, Raynauds. Heart No Chest Pain, Heart murmur,  Irregular heart beat. Cardiovascular System **+Swelling/ whitening of  hands/feet in cold** .          **Vital Signs**    ---    Pain scale 5.5, Ht-in 62, Wt-lbs 182, BMI 33.28, BP 113/78, HR 109.       **Examination**    ---     _Rheumatology_ :    General Appearance Alert and oriented , No apparent distress .    HENT: No, alopecia .    Eyes: No, scleral icterus, scleral erythema .    CV: regular rate rhythm .    Ext: trace LE edema .    Skin: No, rash .    Psych: No anxiety depression.    Muskuloskeletal Elbows, shoulders, hips, knees have intact ROM with no  synovitis. Hands  have synovitis in the PIPs and MCPs bilaterally..    Skin and Nails Minimal sclerodactyly.    synovitis in MCPs and PIPs bilaterally.           **Assessments**    ---    1\. 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 Long-term use of high-risk medication - Z79.899    ---    3\. Hammertoe of second toe of left foot - M20.42    ---    4\. 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 She needs additional treatment and  we spoke about adding another biologic, either Humira or [place] wile she  continues IVIG. She is comfortable with this plan. She is due for repeat  monitoring labs and I will recheck her Ig levels as well. I will speak with  our  pharmacist to consider next treatment options and FU with her in 1-2 weeks  by phone. Otherwise I will see her back in 3 months for further evaluation.    ---       **Treatment**    ---       **1\. Seronegative rheumatoid arthritis**    _LAB: Alkaline Phosphatase (ALK)_   Alkaline Phosphatase (ALK)  445  H  40 -  130 - IU/L    --- --- --- ---    _LAB: Bilirubin, Total (TBIL)_ Bilirubin, Total  1.4  H  0.2 - 1.1 - mg/dL    --- --- --- ---    _LAB: Aspartate aminotransferase (AST)_ Aspartate Aminotranferase (AST/SGOT)   40    10 - 42 - IU/L    --- --- --- ---    _LAB: Alanine aminotransferase (ALT)_ Alanine Aminotransferase (ALT/SGPT)   126  H  0 - 54 - IU/L    --- --- --- ---    _LAB: Blood Urea Nitrogen (BUN)_ Blood Urea Nitrogen  18    6 - 24 - mg/dL    --- --- --- ---    _LAB: Sed Rate ESR (ESR)_ Sed Rate  86  H  0 - 30 - mm    --- --- --- ---    _LAB: Immunoglobulin G IgG (IGG)_ Immunoglobulin G  1268    540 - 1822 - mg/dL    --- --- --- ---    _LAB: C-Reactive Protein (CRP)_ C-Reactive Protein - CRP (Ultra-Wide Range)   6.60    0.00 - 7.48 - mg/L    --- --- --- ---    _LAB: IgG Subcls (IGGSB)_    _LAB: CBC/DIFF with PLT (CBCWD)_ WBC  12.4  H  4.0 - 11.0 - K/uL    --- --- --- ---    RBC  4.26    3.70 - 5.00 - M/uL    --- --- --- ---    HGB  13.3    11.0 - 15.0 - g/dL    --- --- --- ---    HCT  39.8    32.0 - 45.0 - %    --- --- --- ---    MCV  93.4    80.0 - 98.0 - fL    --- --- --- ---    MCH  31.2    26.0 - 34.0 - pg    --- --- --- ---    MCHC  33.4  32.0 - 36.0 - g/dL    --- --- --- ---    RDW  13.2    11.5 - 14.5 - %    --- --- --- ---    PLT  224    150 - 400 - K/uL    --- --- --- ---    MPV  10.9    9.1 - 11.7 - fL    --- --- --- ---    SEG NEUT  77     \- %    --- --- --- ---    LYMPH  16     \- %    --- --- --- ---    MONO  6     \- %    --- --- --- ---    EOS  0     \- %    --- --- --- ---    BASO  0     \- %    --- --- --- ---    NEUT #  9.5  H  1.5 - 7.5 - K/uL    --- --- --- ---    LYMPH #  2.0     1.0 - 4.0 - K/uL    --- --- --- ---    MONO #  0.7    0.2 - 0.8 - K/uL    --- --- --- ---    EOSIN #  0.0    0.0 - 0.5 - K/uL    --- --- --- ---    BASO #  0.0    0.0 - 0.2 - K/uL    --- --- --- ---    Imm Grnas  0     \- %    --- --- --- ---    NRBC  0     \- %    --- --- --- ---    Imm Grans, Abs  0.1    0.0 - 0.1 - K/uL    --- --- --- ---    NRBC, Abs  0.0    <0.0 - K/uL    --- --- --- ---    _LAB: Electrolytes (Na, K, Cl, CO2) LYTES_ ANION GAP  11    3 - 14 -    --- --- --- ---    CL  99    98 - 110 - mEq/L    --- --- --- ---    CO2  23    20 - 30 - mEq/L    --- --- --- ---    K  4.3    3.6 - 5.1 - mEq/L    --- --- --- ---    NA  133  L  135 - 145 - mEq/L    --- --- --- ---    _LAB: Creatinine (CR)_ Creatinine (CR)  0.91    0.57 - 1.30 - mg/dL    --- --- --- ---       **Follow Up**    ---    3 Months    Electronically signed by Erasmo Leventhal , MD on 09/16/2017 at 06:11 PM EDT    Sign off status: Completed        * * *        Rheumatology, Allergy and Immunology    [address]    [facility] [place], [place] [address]    Tel: [phone]    Fax: [phone]              * * *  Patient: [name], [name] DOB: [birth year] Progress Note: [name] Pimple, MD  09/14/2017    ---    Note generated by eClinicalWorks EMR/PM Software ([email or web address])

## 2017-09-14 NOTE — Progress Notes (Signed)
General Medicine Visit    Service Due by Standard Protocol Rules: DIAB EYE EX, PATPORTALPIN, HGBA1C or HGBA1C%POC.      Initial Screening   * [place]: [place] ([place])  Ht: 62.5 in.  Wt: 183.2 lbs.   BMI: 33.09  Temp: 98.6 deg F.     BP (Initial [place] Screening): 122 / 80      BP (Rechecked, Actionable): 122 / 80 mmHg   HR: 98       Med List: PRINTED by [facility] for patient   Chronic Pain Assessment: Does pt experience chronic pain ? YES  Severity of pain? (min=0, max=10) 5  Smoking Assessment: Tobacco use? never smoker    Self Mgmt materials provided? Printed handout: Seasonal Allergies      Falls Risk Assessment:   In the past year, have you ... Had no falls  Difficulty with balance? NO  Need assistance with ambulation while here? NO    Behavioral Health Tools:   PHQ2 Screening Score 0    Comments: ........................................Marland KitchenVito Backers  September 14, 2017 2:17 PM        Chief Complaint:   f/up MMP    History of Present Illness:   57F here for f/up MMP    Nurse noted she's been in afib, paroxysmally. Happens prior to infusions. Does not feel it when it happens. Her nurse did not get an EKG at the time. She did go to an urgent care center the first time, but that EKG did not get sent to me.     Rheum -IV IgG4 infusion was done today with [name]. He wants to start her on Humira only AFTER she's gotten Shingrix.     HbA1c is 10 today; last visit it was 9.9. Still on 10mg  prednisone daily.   Has lost 8 lbs since last visit. Has been watching her diet.  Has not seen endocrinology.    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        Past Medical History:(reviewed)  HEALTH CARE MAINTENANCE  STD- low risk, deferred  Pap smear - Negative co-testing in 2015. Next 2020.  Mammogram - normal 03/2017  Colonoscopy -  done 2016, repeat 2017 - both times poor prep but overall has been sufficient for screening purposes.   In combination, these two colonoscopies have likely given an adequate colon cancer screening. Recommend repeat  colonoscopy in 3 years.  1 polyp removed, next colonoscopy in 3 yrs (2020). ***At that time in 2020, will do 7 days of daily miralax. Seven days low fiber diet. Two days clear liquids and a split prep with 2 dulcolax before each dose of golytely.  DEXA (>25yr) - 2017 normal bone mass; on chronic prednisone use. Surveillance due 2019 at annual exam  Lung cancer screening (55-80)- N/A  Influenza - 01/2017  HPV - N/A  Hep C (4034-7425) - non reactive 2017  Shingles shot - recommended she get Shingrix when/where it's available  Pneumonia - PPSV23 01/26/2017. Due for booster at age 57.  Tetanus - TDAP 11/04/2014  Contraception - post menopausal  LDL - 185   (08/05/2015)    HGBA1C - 8.7     (10/05/2016)    Family History: (reviewed)   Father: DMII, died of MI at 59  Mom: died of lung CA at 36  MGM and PGM: lung ca (smokers)  MGF and several other second degree relatives: brain aneurysms in their 55's.  2 maternal uncles:melanoma (one died of unknown cancer, one living)  brother recently  diagnosed with systemic scleroderma    Social History: (reviewed)   Non-smoker, rare etoh, no IVDU. 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 Living together with her lifelong partner of ~20 years - may be getting legally married to him soon. Sexually active with husband only. No children. Always wears a seat belt.      A complete ROS was done. All positive responses are listed in HPI section. All other systems reviewed in detail and are negative.      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)  DIABETES MELLITUS, TYPE II (ICD-250.00) (ICD10-E11.9)  LONG-TERM (CURRENT) USE OF STEROIDS (ICD-V58.65) (ICD10-Z79.51)  SKIN SAGGING DUE TO WEIGHT LOSS (ICD-757.9) (ICD10-Q84.9)  TRANSAMINASES, SERUM, ELEVATED (ICD-790.4) (ICD10-R74.0)  OBESITY (ICD-278.00) (ICD10-E66.9)       DIABETES MELLITUS, TYPE II, UNCONTROLLED (ICD-250.02) (ICD10-E11.65)      FATTY LIVER DISEASE (ICD-571.8) (ICD10-K76.0)  SCLERODERMA, 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)  HYPOTHYROIDISM (ICD-244.9) (ICD10-E03.9)  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) (ICD10-D80.8)  SPINAL STENOSIS, 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)  HEPATITIS C EXPOSURE (HCV RNA NEGATIVE, 01/2014) (ICD-V02.62) (ICD10-Z20.5)  PAIN IN JOINT, HAND (ICD-719.44) (ICD10-M79.643)  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 OF ATRIAL FIBRILLATION, PAROXYSMAL (ICD-427.31) (ICD10-I48.0)     Problems Reviewed:  Done      Medications (prior to today's visit):  ROSUVASTATIN CALCIUM 10 MG ORAL TABLET (ROSUVASTATIN CALCIUM) take one tablet by mouth daily; Route: ORAL  LISINOPRIL 2.5 MG ORAL TABLET (LISINOPRIL) Take one tablet by mouth once daily; Route: ORAL  GLUCOPHAGE XR 500 MG ORAL TABLET EXTENDED RELEASE 24 HOUR (METFORMIN HCL) take four tablets by mouth daily every morning; Route: ORAL  GLIPIZIDE XL 2.5 MG ORAL TABLET EXTENDED RELEASE 24 HOUR (GLIPIZIDE) Take one tablet by mouth once daily; Route: ORAL  PREDNISONE 10 MG ORAL TABLET (PREDNISONE) Take 1 tablet by mouth once daily; Route: ORAL  LEVOTHYROXINE SODIUM 25 MCG ORAL TABLET (LEVOTHYROXINE SODIUM) Take one tablet by mouth once daily  OMEPRAZOLE 20 MG ORAL CAPSULE DELAYED RELEASE (OMEPRAZOLE) Take one capsule by mouth twice a day; Route:  ORAL  IBUPROFEN 600 MG ORAL TABLET (IBUPROFEN) 1 tab by mouth TID as needed for pain  BACLOFEN 10 MG ORAL TABLET (BACLOFEN) Please take half tablet once a day as needed for back spasms  DICLOFENAC SODIUM 1 % TRANSDERMAL GEL (DICLOFENAC SODIUM) APP 2 GRAMS EXT AA BID      FREESTYLE FREEDOM LITE w/Device KIT (BLOOD GLUCOSE MONITORING SUPPL) use as directed (ICD 10 E11.65)      FREESTYLE LITE TEST IN VITRO STRIP (GLUCOSE BLOOD) check fingerstick three times a day (ICD 10 E11.65); Route: IN VITRO      FREESTYLE LANCETS (LANCETS) check fingerstick three times a day (ICD 10 E11.65)  TRAMADOL HCL 50 MG ORAL TABLET (TRAMADOL HCL) take one tablet daily as needed for pain; Route: ORAL  OXYCODONE HCL 5 MG ORAL TABLET (OXYCODONE HCL) Partial  fill upon patient request.  Take one tablet daily as needed for pain >8; Route: ORAL  CALCIUM CARBONATE-VITAMIN D 600-200 MG-UNIT ORAL TABLET (CALCIUM CARBONATE-VITAMIN D) take one tablet twice a day; Route: ORAL  GABAPENTIN 300 MG ORAL CAPSULE (GABAPENTIN) one capsule by mouth at bedtime as needed for pain; Route: ORAL  HYDROXYCHLOROQUINE SULFATE 200 MG ORAL TABLET (HYDROXYCHLOROQUINE SULFATE) one tablet oral daily; Route: ORAL    No Changes to Medication List   Medications Reviewed:  Done      No Known Allergies  Allergies Reviewed:  Done            Vitals:   Ht: 62.5 in.  Wt: 183.2 lbs.  BMI (in-lb) 33.09  Temp: 98.6deg F.     BP (Initial [place] Screening): 122 / 80     BP (Rechecked, Actionable): 122 / 80 mmHg   Pulse Rate: 98 bpm      Additional PE:   Gen: Alert, NAD, well hydrated, obese  Neck: Supple, no ant/post cervical or supraclavicular lymphadenopathy  CV: RRR, no M/R/G, no edema  Pulm: CTA b/l, resp effort wnl      Assessment & Plan:   Question of paroxysmal AFIB:  in sinus rhythm today. CHADsVasc score would be 2., if this rhythm she was noted to have was in fact afib. Will need to get the EKG that shows it from the urgent care center she went to. Also discussed cardiac monitor to  catch the abnormal rhythm.  (score 2 or greater is "moderate-high" risk and should otherwise be an anticoagulation candidate.)  Check TSH, electrolytes  Ordered echo  Referred to [facility]    DIABETES MELLITUS, TYPE II (ICD-250.00) (ICD10-E11.9)  - medications: glucophage, glipizide. Had hypoglycemia with glipizide when increased to 5mg  dosing, so will avoid that. She likely needs an injectible. Still reluctant to consider insulin. Will refer her back to endocrine to manage her hyperglycemia likely due to increased prednisone dosing. Strongly encouraged her to make endocrine appt -- she has not gone back in a while. Emphasized the cardiovascular risk that having diabetes imposes.  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 Recommended Shingrix.    HYPOTHYROIDISM: continue levothyroxine. 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. Chronic prednisone use is ongoing. DEXA 2017 showed normal bone mass. continue Ca+Vit D and omeprazole. Next DEXA 2019 at annual exam    OBESITY (ICD-278.00) (ICD10-E66.9) Has lost 8 more pounds since last visit. Congratulated her on weight loss efforts through diet!    RTC for annual exam in the fall.  I spent 40 minutes with this patient, of which >30 minutes were spent in counseling and care coordination.       Orders (this visit):  [facility], Arrhythmia [Ref-Cardio]  Echo 2D Adult (Complete TTE) [CPT-93320]  TSH Reflex [CPT-84439]  [facility] ([facility] [Ref-Endo]  Request for Medical Records [MSC-00000]  RTC in 6 months [RTC-180]  Est Level 4  [CPT-99214]    Patient Care Plan      Immunization Worksheet 2016  Created By Vito Backers on 09/14/2017 at 02:17 PM    Electronically Signed By [name], MD on 09/14/2017 at 03:10 PM

## 2017-09-17 ENCOUNTER — Ambulatory Visit

## 2017-09-17 ENCOUNTER — Ambulatory Visit: Admitting: Rheumatology

## 2017-09-17 NOTE — Telephone Encounter (Signed)
Call Details:   Pt would like to like to let you know that she got a shingles vax. on friday. .......................................Bluford Kaufmann  September 17, 2017 10:33 AM      RESPONSE/ORDERS:  noted, thanks .......................................[name], MD  September 17, 2017 10:57 AM                 ORDERS/PROBS/MEDS/ALL     Problems:   ? 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)  DIABETES MELLITUS, TYPE II (ICD-250.00) (ICD10-E11.9)  LONG-TERM (CURRENT) USE OF STEROIDS (ICD-V58.65) (ICD10-Z79.51)  SKIN SAGGING DUE TO WEIGHT LOSS (ICD-757.9) (ICD10-Q84.9)  TRANSAMINASES, SERUM, ELEVATED (ICD-790.4) (ICD10-R74.0)  OBESITY (ICD-278.00) (ICD10-E66.9)      DIABETES MELLITUS, TYPE II, UNCONTROLLED (ICD-250.02) (ICD10-E11.65)      FATTY LIVER DISEASE (ICD-571.8) (ICD10-K76.0)  SCLERODERMA, 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)  HYPOTHYROIDISM (ICD-244.9) (ICD10-E03.9)  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) (ICD10-D80.8)  SPINAL STENOSIS, 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)  HEPATITIS C EXPOSURE (HCV RNA NEGATIVE, 01/2014) (ICD-V02.62) (ICD10-Z20.5)  PAIN IN JOINT, HAND (ICD-719.44) (ICD10-M79.643)  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)  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)  CARPAL TUNNEL (ICD-354.0) (ICD10-G56.00)  OSTEOARTHRITIS (ICD-715.09) (ICD10-M15.9)  S/P ROTATOR CUFF SURGERY 12/2004 - RT SHOULDER; 2008 LT (ICD-V45.89)  Family Hx of MELANOMA, FAMILY HX (ICD-V16.8) (ICD10-Z80.8)  FRACTURE OF OTHER SPEC SITE,  PATHOLOGIC - MULTIPLE (ICD-733.19)  CHOLECYSTECTOMY AND HERNIA REPAIR (ICD-V45.89)  VITAMIN D DEFICIENCY (ICD-268.9) (ICD10-E55.9)  COLONOSCOPY, NEXT 2020- SEE COMMENT (ICD-V76.51) (ICD10-Z01.89)    Meds (prior to this call):   ROSUVASTATIN CALCIUM 10 MG ORAL TABLET (ROSUVASTATIN CALCIUM) take one tablet by mouth  daily; Route: ORAL  LISINOPRIL 2.5 MG ORAL TABLET (LISINOPRIL) Take one tablet by mouth once daily; Route: ORAL  GLUCOPHAGE XR 500 MG ORAL TABLET EXTENDED RELEASE 24 HOUR (METFORMIN HCL) take four tablets by mouth daily every morning; Route: ORAL  GLIPIZIDE XL 2.5 MG ORAL TABLET EXTENDED RELEASE 24 HOUR (GLIPIZIDE) Take one tablet by mouth once daily; Route: ORAL  PREDNISONE 10 MG ORAL TABLET (PREDNISONE) Take 1 tablet by mouth once daily; Route: ORAL  LEVOTHYROXINE SODIUM 25 MCG ORAL TABLET (LEVOTHYROXINE SODIUM) Take one tablet by mouth once daily  OMEPRAZOLE 20 MG ORAL CAPSULE DELAYED RELEASE (OMEPRAZOLE) Take one capsule by mouth twice a day; Route: ORAL  IBUPROFEN 600 MG ORAL TABLET (IBUPROFEN) 1 tab by mouth TID as needed for pain  BACLOFEN 10 MG ORAL TABLET (BACLOFEN) Please take half tablet once a day as needed for back spasms  DICLOFENAC SODIUM 1 % TRANSDERMAL GEL (DICLOFENAC SODIUM) APP 2 GRAMS EXT AA BID      FREESTYLE FREEDOM LITE w/Device KIT (BLOOD GLUCOSE MONITORING SUPPL) use as directed (ICD 10 E11.65)      FREESTYLE LITE TEST IN VITRO STRIP (GLUCOSE BLOOD) check fingerstick three times a day (ICD 10 E11.65); Route: IN VITRO      FREESTYLE LANCETS (LANCETS) check fingerstick three times a day (ICD 10 E11.65)  TRAMADOL HCL 50 MG ORAL TABLET (TRAMADOL HCL) take one tablet daily as needed for pain; Route: ORAL  OXYCODONE HCL 5 MG ORAL TABLET (OXYCODONE HCL) Partial fill upon patient request.  Take one tablet daily as needed for pain >8; Route: ORAL  CALCIUM CARBONATE-VITAMIN D 600-200 MG-UNIT ORAL TABLET (CALCIUM CARBONATE-VITAMIN D) take one tablet twice a day; Route: ORAL  GABAPENTIN 300 MG ORAL CAPSULE (GABAPENTIN) one capsule by mouth at bedtime as needed for pain;  Route: ORAL  HYDROXYCHLOROQUINE SULFATE 200 MG ORAL TABLET (HYDROXYCHLOROQUINE SULFATE) one tablet oral daily; Route: ORAL            Created By Bluford Kaufmann on 09/17/2017 at 10:32 AM    Electronically Signed By [name], MD on  09/17/2017 at 10:57 AM

## 2017-09-17 NOTE — Progress Notes (Signed)
[facility]  [address]  [place] [address]  Main: [phone]  Fax: [phone]  Patient Portal: [email or web address]      September 18, 2017            MRN: [id number]            DOB: 1961/01/15   [name]   [address]   [place] [address]      The results of your recent tests are as follows:      Diabetes Tests:      HgbA1c:    10.0  Normal 4.3 - 5.6    09/14/2017    HgbA1c POC:    10.0 %  Normal 4.3 - 5.6    09/14/2017     Endocrine Tests:      TSH:     0.76  Normal 0.35-4.94   09/14/2017       Sincerely,       [name], MD  [phone]  [facility]  [phone]          Created By Bluford Kaufmann on 09/17/2017 at 03:51 PM    Electronically Signed By [name], MD on 09/18/2017 at 05:29 PM

## 2017-09-17 NOTE — Progress Notes (Signed)
* * *        **  [name]**    --- ---    36 Y old Female, DOB: 1960-07-07    [address] [place], [place] [address]    Home: [phone]    Provider: [name]        * * *    Telephone Encounter    ---    Answered by   Orson Slick  Date: 09/17/2017         Time: 02:56 PM    Reason   IVIg Approved EXP: 09/30/19    --- ---            Message                      Got shingles vaccine. Ok to look into humirA?      Also do peer to peer [phone] opt 2 - ask for a clinician            Neighborhood health plan HMO commercial active 12/03/11 [id number]      Re-review                 Action Taken                      [name],[name]  09/17/2017 2:57:26 PM >       [name],[name]  09/17/2017 3:28:25 PM > LVM for patient that received message and am working on PA      [name],[name]  09/18/2017 3:30:57 PM > results of subclass IgG still pending      [name],[name]  09/20/2017 1:05:18 PM > submitted chart note and labs for rereview to [phone]. PA# [id number]. Per [facility], clinicians will reach out to [place] with decision. Notified Nicholos Johns  09/25/2017 11:02:26 AM > Spoke with Casimer Bilis. Approved from 10/01/2017 to 09/30/2019 PA number [id number]      [name],[name]  09/25/2017 11:04:03 AM > Discussed with Candise Bowens at [facility] and gave PA info                    * * *                ---          * * *          Patient: [name] DOB: 1960/12/04 Provider: [name] 09/17/2017    ---    Note generated by eClinicalWorks EMR/PM Software ([email or web address])

## 2017-09-19 LAB — HX IMMUNOLOGY
HX IGG SUBCLASS TOTAL: 1233 mg/dL
HX IGG SUBCLASS-1: 674 mg/dL
HX IGG SUBCLASS-2: 465 mg/dL
HX IGG SUBCLASS-3: 54 mg/dL
HX IGG SUBCLASS-4: 29 mg/dL

## 2017-09-21 ENCOUNTER — Ambulatory Visit

## 2017-09-21 NOTE — Telephone Encounter (Signed)
Call Details:   Patient PCP = [name], MD  [name] (Patient) called on September 21, 2017 4:00 PM.  Message taken by: Albertina Senegal  Primary call-back number: [phone]    Secondary call-back number: [phone] -    Call Reason(s): Message/Call-Back      ** MESSAGE / CALL-BACK.  Regarding: pt is returning call from Dr. Marlane Hatcher. Please refer to last phone note.     ---------- ---------- ---------- ---------- ---------- ----------       RESPONSE/ORDERS:  discussed with pt about PVCs. She does NOT have afib. 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)  DIABETES MELLITUS, TYPE II (ICD-250.00) (ICD10-E11.9)  LONG-TERM (CURRENT) USE OF STEROIDS (ICD-V58.65) (ICD10-Z79.51)  SKIN SAGGING DUE TO WEIGHT LOSS (ICD-757.9) (ICD10-Q84.9)  TRANSAMINASES, SERUM, ELEVATED (ICD-790.4) (ICD10-R74.0)  OBESITY (ICD-278.00) (ICD10-E66.9)      DIABETES MELLITUS, TYPE II, UNCONTROLLED (ICD-250.02) (ICD10-E11.65)      FATTY LIVER DISEASE (ICD-571.8) (ICD10-K76.0)  SCLERODERMA, 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)  HYPOTHYROIDISM (ICD-244.9) (ICD10-E03.9)  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) (ICD10-D80.8)  SPINAL STENOSIS, 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)  HEPATITIS C EXPOSURE (HCV RNA NEGATIVE, 01/2014) (ICD-V02.62) (ICD10-Z20.5)  PAIN IN JOINT, HAND (ICD-719.44) (ICD10-M79.643)  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)  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)  CARPAL TUNNEL (ICD-354.0) (ICD10-G56.00)  OSTEOARTHRITIS (ICD-715.09) (ICD10-M15.9)  S/P ROTATOR CUFF SURGERY 12/2004 - RT SHOULDER; 2008 LT (ICD-V45.89)  Family Hx of MELANOMA, FAMILY  HX (ICD-V16.8) (ICD10-Z80.8)  FRACTURE OF OTHER SPEC SITE,  PATHOLOGIC - MULTIPLE (ICD-733.19)  CHOLECYSTECTOMY AND HERNIA REPAIR (ICD-V45.89)  VITAMIN D DEFICIENCY (ICD-268.9) (ICD10-E55.9)  COLONOSCOPY, NEXT 2020- SEE COMMENT (ICD-V76.51) (ICD10-Z01.89)    Meds (prior to this call):   ROSUVASTATIN CALCIUM 10 MG ORAL TABLET (ROSUVASTATIN CALCIUM) take one tablet by mouth daily; Route: ORAL  LISINOPRIL 2.5 MG ORAL TABLET (LISINOPRIL) Take one tablet by mouth once daily; Route: ORAL  GLUCOPHAGE XR 500 MG ORAL TABLET EXTENDED RELEASE 24 HOUR (METFORMIN HCL) take four tablets by mouth daily every morning; Route: ORAL  GLIPIZIDE XL 2.5 MG ORAL TABLET EXTENDED RELEASE 24 HOUR (GLIPIZIDE) Take one tablet by mouth once daily; Route: ORAL  PREDNISONE 10 MG ORAL TABLET (PREDNISONE) Take 1 tablet by mouth once daily; Route: ORAL  LEVOTHYROXINE SODIUM 25 MCG ORAL TABLET (LEVOTHYROXINE SODIUM) Take one tablet by mouth once daily  OMEPRAZOLE 20 MG ORAL CAPSULE DELAYED RELEASE (OMEPRAZOLE) Take one capsule by mouth twice a day; Route: ORAL  IBUPROFEN 600 MG ORAL TABLET (IBUPROFEN) 1 tab by mouth TID as needed for pain  BACLOFEN 10 MG ORAL TABLET (BACLOFEN) Please take half tablet once a day as needed for back spasms  DICLOFENAC SODIUM 1 % TRANSDERMAL GEL (DICLOFENAC SODIUM) APP 2 GRAMS EXT AA BID      FREESTYLE FREEDOM LITE w/Device KIT (BLOOD GLUCOSE MONITORING SUPPL) use as directed (ICD 10 E11.65)      FREESTYLE LITE TEST IN VITRO STRIP (GLUCOSE BLOOD) check fingerstick three times a day (ICD 10 E11.65); Route: IN VITRO      FREESTYLE LANCETS (LANCETS) check fingerstick three times a day (ICD 10 E11.65)  TRAMADOL HCL 50 MG ORAL TABLET (TRAMADOL HCL)  take one tablet daily as needed for pain; Route: ORAL  OXYCODONE HCL 5 MG ORAL TABLET (OXYCODONE HCL) Partial fill upon patient request.  Take one tablet daily as needed for pain >8; Route: ORAL  CALCIUM CARBONATE-VITAMIN D 600-200 MG-UNIT ORAL TABLET (CALCIUM CARBONATE-VITAMIN D)  take one tablet twice a day; Route: ORAL  GABAPENTIN 300 MG ORAL CAPSULE (GABAPENTIN) one capsule by mouth at bedtime as needed for pain; Route: ORAL  HYDROXYCHLOROQUINE SULFATE 200 MG ORAL TABLET (HYDROXYCHLOROQUINE SULFATE) one tablet oral daily; Route: ORAL            Created By Albertina Senegal on 09/21/2017 at 04:00 PM    Electronically Signed By [name], MD on 09/21/2017 at 04:06 PM

## 2017-09-21 NOTE — Telephone Encounter (Signed)
RESPONSE/ORDERS:  called and LMOM to let pt know that i received the EKG report from the [facility] center she went to and that it does NOT show afib. It shows PVCs. Explained that these are generally benign and we can discuss further - not urgent. 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)  DIABETES MELLITUS, TYPE II (ICD-250.00) (ICD10-E11.9)  LONG-TERM (CURRENT) USE OF STEROIDS (ICD-V58.65) (ICD10-Z79.51)  SKIN SAGGING DUE TO WEIGHT LOSS (ICD-757.9) (ICD10-Q84.9)  TRANSAMINASES, SERUM, ELEVATED (ICD-790.4) (ICD10-R74.0)  OBESITY (ICD-278.00) (ICD10-E66.9)      DIABETES MELLITUS, TYPE II, UNCONTROLLED (ICD-250.02) (ICD10-E11.65)      FATTY LIVER DISEASE (ICD-571.8) (ICD10-K76.0)  SCLERODERMA, 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)  HYPOTHYROIDISM (ICD-244.9) (ICD10-E03.9)  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) (ICD10-D80.8)  SPINAL STENOSIS, 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)  HEPATITIS C EXPOSURE (HCV RNA NEGATIVE, 01/2014) (ICD-V02.62) (ICD10-Z20.5)  PAIN IN JOINT, HAND (ICD-719.44) (ICD10-M79.643)  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)  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)  CARPAL TUNNEL (ICD-354.0) (ICD10-G56.00)  OSTEOARTHRITIS (ICD-715.09) (ICD10-M15.9)  S/P ROTATOR CUFF SURGERY 12/2004 - RT SHOULDER; 2008 LT (ICD-V45.89)  Family Hx of MELANOMA, FAMILY HX (ICD-V16.8) (ICD10-Z80.8)  FRACTURE OF OTHER SPEC SITE,  PATHOLOGIC - MULTIPLE (ICD-733.19)  CHOLECYSTECTOMY AND HERNIA REPAIR (ICD-V45.89)  VITAMIN D DEFICIENCY (ICD-268.9) (ICD10-E55.9)  COLONOSCOPY, NEXT 2020- SEE  COMMENT (ICD-V76.51) (ICD10-Z01.89)    Meds (prior to this call):   ROSUVASTATIN CALCIUM 10 MG ORAL TABLET (ROSUVASTATIN CALCIUM) take one tablet by mouth daily; Route: ORAL  LISINOPRIL 2.5 MG ORAL TABLET (LISINOPRIL) Take one tablet by mouth once daily; Route: ORAL  GLUCOPHAGE XR 500 MG ORAL TABLET EXTENDED RELEASE 24 HOUR (METFORMIN HCL) take four tablets by mouth daily every morning; Route: ORAL  GLIPIZIDE XL 2.5 MG ORAL TABLET EXTENDED RELEASE 24 HOUR (GLIPIZIDE) Take one tablet by mouth once daily; Route: ORAL  PREDNISONE 10 MG ORAL TABLET (PREDNISONE) Take 1 tablet by mouth once daily; Route: ORAL  LEVOTHYROXINE SODIUM 25 MCG ORAL TABLET (LEVOTHYROXINE SODIUM) Take one tablet by mouth once daily  OMEPRAZOLE 20 MG ORAL CAPSULE DELAYED RELEASE (OMEPRAZOLE) Take one capsule by mouth twice a day; Route: ORAL  IBUPROFEN 600 MG ORAL TABLET (IBUPROFEN) 1 tab by mouth TID as needed for pain  BACLOFEN 10 MG ORAL TABLET (BACLOFEN) Please take half tablet once a day as needed for back spasms  DICLOFENAC SODIUM 1 % TRANSDERMAL GEL (DICLOFENAC SODIUM) APP 2 GRAMS EXT AA BID      FREESTYLE FREEDOM LITE w/Device KIT (BLOOD GLUCOSE MONITORING SUPPL) use as directed (ICD 10 E11.65)      FREESTYLE LITE TEST IN VITRO STRIP (GLUCOSE BLOOD) check fingerstick three times a day (ICD 10 E11.65); Route: IN VITRO      FREESTYLE LANCETS (LANCETS) check fingerstick three times a day (ICD 10 E11.65)  TRAMADOL HCL 50 MG ORAL TABLET (TRAMADOL HCL) take one tablet daily as needed for pain; Route: ORAL  OXYCODONE HCL 5 MG ORAL TABLET (OXYCODONE HCL) Partial fill upon patient request.  Take one tablet daily as needed for pain >8; Route: ORAL  CALCIUM CARBONATE-VITAMIN D 600-200 MG-UNIT ORAL TABLET (  CALCIUM CARBONATE-VITAMIN D) take one tablet twice a day; Route: ORAL  GABAPENTIN 300 MG ORAL CAPSULE (GABAPENTIN) one capsule by mouth at bedtime as needed for pain; Route: ORAL  HYDROXYCHLOROQUINE SULFATE 200 MG ORAL TABLET (HYDROXYCHLOROQUINE  SULFATE) one tablet oral daily; Route: ORAL            Created By [name], MD on 09/21/2017 at 03:37 PM    Electronically Signed By [name], MD on 09/21/2017 at 03:39 PM

## 2017-09-24 ENCOUNTER — Ambulatory Visit

## 2017-09-24 ENCOUNTER — Ambulatory Visit: Admitting: Rheumatology

## 2017-09-24 MED ORDER — Hydroxychloroquine Sulfate: 200 | 90 | Freq: Every day | 1 refills | 0 days | Status: AC

## 2017-09-24 NOTE — Progress Notes (Signed)
* * *        **  [name]**    --- ---    89 Y old Female, DOB: 02-22-1961    [address], [place], [place] [address]    Home: [phone]    Provider: [name]        * * *    Telephone Encounter    ---    Answered by   Oletha Blend  Date: 09/24/2017         Time: 02:19 PM    Reason   refill    --- ---            Refills  Refill Hydroxychloroquine Sulfate Tablet, 200 mg, Orally, 90, 1  tablet with food or milk, Once a day, 90 days, Refills=1    --- ---          * * *                ---          * * *          PatientDenny Peon, [name] DOB: 02/10/1961 Provider: [name] 09/24/2017    ---    Note generated by eClinicalWorks EMR/PM Software ([email or web address])

## 2017-10-02 ENCOUNTER — Ambulatory Visit: Admitting: Adult Health

## 2017-10-02 ENCOUNTER — Ambulatory Visit: Admitting: Rheumatology

## 2017-10-02 ENCOUNTER — Ambulatory Visit: Admitting: "Endocrinology

## 2017-10-02 ENCOUNTER — Ambulatory Visit

## 2017-10-02 ENCOUNTER — Ambulatory Visit: Admit: 2017-10-02 | Payer: No Typology Code available for payment source

## 2017-10-02 MED ORDER — Jardiance: 10 | 90 | Freq: Every day | 3 refills | 0 days

## 2017-10-02 MED ORDER — Omeprazole: 40 | 90 | Freq: Every day | 1 refills | 0 days | Status: AC

## 2017-10-02 NOTE — Progress Notes (Signed)
* * *        **  [name]**    --- ---    29 Y old Female, DOB: March 08, 1961    [address] [place], [place] [address]    Home: [phone]    Provider: Dawna [name]        * * *    Telephone Encounter    ---    Answered by   [name]  Date: 10/02/2017         Time: 08:48 AM    Reason   SGLT2i    --- ---            Message                      Hi [name], all the drugs in the SGLT2i class are coming up as not covered when I check for this patient. Do you know if there is a preferred one? I was going to prescribe my preferred which is jardiance but wanted to find out the out of pocket cost to the patient. Thanks                Action Taken                      [name]  10/02/2017 8:49:53 AM >       [name],[name]  10/02/2017 2:05:53 PM > When I run a test claim it is $40 copay. There is a voucher available that brings it down to $15 (my system automatically finds one but she could get from [employer]). Claim info does say copay will double if she doesn't use [facility] or mail order.      [name]  10/02/2017 3:21:54 PM > okay thank you, spoke to pt about the above                    * * *                ---          * * *          Patient: [name] DOB: [birth year] Provider: Dawna [name] 10/02/2017    ---    Note generated by eClinicalWorks EMR/PM Software ([email or web address])

## 2017-10-02 NOTE — Progress Notes (Signed)
* * *        **  [name]**    --- ---    40 Y old Female, DOB: July 23, 1960    [address], [place], [place] [address]    Home: [phone]    Provider: [name]        * * *    Telephone Encounter    ---    Answered by   Oswaldo Conroy  Date: 10/02/2017         Time: 09:01 AM    Reason   Refill    --- ---            Action Taken                      Oswaldo Conroy  10/02/2017 9:02:43 AM > Script sent to above pharmacy.                Refills  Refill Omeprazole Capsule Delayed Release, 40 mg, Orally, 90, 1  capsule, Once a day, 90 days, Refills=1    --- ---          * * *                ---          * * *          PatientDenny Peon, Valia DOB: 28-Apr-1960 Provider: [name] 10/02/2017    ---    Note generated by eClinicalWorks EMR/PM Software ([email or web address])

## 2017-10-02 NOTE — Progress Notes (Signed)
.  Progress Notes  .  Patient: [name]  Provider: [name]    .  DOB: January 16, 1961 Age: 57 Y Sex: Female  Supervising Provider:: Delora Fuel, MD, PhD  Date: 10/02/2017  .  PCP: [name]    Date: 10/02/2017  .  --------------------------------------------------------------------------------  .  REASON FOR APPOINTMENT  .  1. FOLLOW UP PER DR MOULI  .  HISTORY OF PRESENT ILLNESS  .  General:  57 y/o female with scleroderma (on  prednisone), IgG-4 deficiency, NASH and uncontrolled T2DM. Seen  for initial visit on 03/15/16 for management of hyperglycemia in  diabetes. No Endocrine visit since 04/2016. In the interim has  continued to check blood sugars at home, take metformin and now  glipizide is reduced to 2.5mg  as she reports hypoglycemia  episodes with the 5mg  dose.  Diabetes Meds:  .  glipizide 2.5mg   metformin 2000mg  daily  no previous medications or insulin injections  -----  reports hypoglycemia 40-60's range on a higher dose of  glipizide-5mg , 10mg .  .  A1c Trend:  .  10%-----09/14/17  8.7%-----03/02/16  11%-----08/05/15  7.1%-----11/04/14  7%-----12/31/13  6.6%-----05/06/13.  [name]  Recent Blood Glucoses:  .  no glucometer today  endorses fasting bg 100-160 range  daytime readings 200-300.  .  Diet:  .  3 meals daily, low CHO  working on reducing fat/cho portions--seen by Nutrition 04/2016.  [name]  Exercise:  .  walks 1-2 mi daily with dog.  .  Microvascular Complications Screening:  .  eyes: no acute vision changes, no hx of retinopathy, reports  annual exam with ophthalmologist is scheduled  kidneys: creat:0.91, egfr 71 (09/14/17), uACR:137 (06/2017)  feet: denies numbess or parethesias in feet, seen by Dr. Vassie Loll-  hammering of the lesser toes, given rx for custom orthotics.  .  Macrovascular Complications Screening:  .  no hx of MI, CVA or PVD  (+) hld  Lipids: TChol: 158, TG:110, HDL:67, LDL:69, Chol/HDL ratio: 2.3%  (01/2017)  on rosuvastatin.  [name]  CURRENT MEDICATIONS  .  Taking Baclofen 10 MG Tablet  1/2 tab Orally prn  Taking Calcium Carbonate-Vitamin D 600-200 MG-UNIT Capsule 1  capsule with a meal Orally Twice a day  Taking GlipiZIDE XL 2.5 MG Tablet Extended Release 24 Hour 1  tablet Orally Once a day  Taking Glucophage XR 500 mg Tablet Extended Release 24 Hour 4  tablets Orally Once a day  Taking Hydroxychloroquine Sulfate 200 mg Tablet 1 tablet with  food or milk Orally Once a day  Taking Ibuprofen 600 MG Tablet 1 tablet with food or milk as  needed Orally Three times a day  Taking Levothyroxine Sodium 25 MCG Tablet 1 tablet Orally Once a  day  Taking Omeprazole 40 mg Capsule Delayed Release 1 capsule Orally  Once a day  Taking PredniSONE 10 MG Tablet 1 tablet Orally Once a day  Taking Ranitidine HCl 150 mg Capsule 1 capsule at bedtime Orally  Once a day  Taking Rosuvastatin Calcium 10 MG Tablet 1 tablet Orally Once a  day  Taking Sulindac 150 mg Tablet 1 tablet with food Orally Twice a  day  Taking Tramadol HCl 50 MG Tablet 1 tablet Orally every 6 hrs  Not-Taking/PRN Humira Pen 40 MG/0.8ML Pen-injector Kit 0.8 ml  Subcutaneous every 2 weeks  Not-Taking/PRN Voltaren 1 % Gel 2 grams Transdermal twice daily,  Notes: PRN  Unknown Oxycodone HCl 5 mg Tablet 1 tablet Orally as needed for  pain  Medication List  reviewed and reconciled with the patient  .  PAST MEDICAL HISTORY  .  Scleroderma - CREST dx 2007  IgG4 deficiency s/p IVIG 2010 ----single infusion given  preventively after week of bilateral knee replacements at [facility]  2010  Fx left wrist in 1994  Blood clot at LUE in 2006 on a short course of Coumadin  Bilateral carpal tunnel syndrome s/p Lt carpal tunnel release and  steroids injection right--  Bone spur left foot  Scoliosis  Spinal stenosis s/p steroids injection  s/p bilateral knee replacements for valgus /arthritic  complications, performed by Dr. Katrinka Blazing 2010--- never infected, but  packed with antibiotics with surgery;  Right rotator cuff repairs x 4, complicated by repeat tears,  infection, placement  of anchor material  Elevated liver function tests  Diabetes  .  ALLERGIES  .  yes[Allergies Verified]  .  SURGICAL HISTORY  .  Right rotator cuff repair, with infected hardware that had to be  removed 2006  Repeat right shoulder surgery, also which became infected. 2007  Left rotator cuff repair 2008  bilateral knee replacements 2009  Left arthroscopic carpal tunnel release 01/2011  ORIF Left 4th metatarsal bone  Left Ulna shortening 1994  Knee replacement 2010  Hand surgery 2013, 2015  remove gallbladder/hernia 1990  Plate left foot 3rd metatarsal 2008  .  FAMILY HISTORY  .  Mother: deceased 50 yrs, lung cancer, hyperthyroidism, diagnosed  with Cancer  Father: deceased 59s yrs, heart attack, Heart Disease  3 brother(s) .  FH of arthritis and scleroderma\nFather deceased from MI\nMother  deceased age 5 with lung cancer \\nBrother  with scleroderma \/  copd.  .  SOCIAL HISTORY  .  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  .  .  Alcohol  Former daily EtOH use in 20s  .  [name]  Tobacco  history: Never smoked.  .  Nonsmoker.Lives with longstanding boyfriend.  [name]  HOSPITALIZATION/MAJOR DIAGNOSTIC PROCEDURE  .  as above  .  REVIEW OF SYSTEMS  .  Diabetes:  .  General:    no unintended weight change, no fevers, malaise,  weakness . Eyes:    no vision loss . Cardiovascular:    no CP, no  orthopnea, no palpitations . Respiratory:    no wheezing, no  dyspnea . Gastrointestinal:    no n/v, no abdominal pain .  Musculoskeletal:    no edema . Skin:    no rash . Neurological:     no numbness .  [name]  VITAL SIGNS  .  Pain scale 5, Ht-in 62, Wt-lbs 180, BMI 32.92, BP 120/80, HR 80,  BSA 1.89, Ht-cm 157.48, Wt-kg 81.65, Wt Change -2 lb.  .  PHYSICAL EXAMINATION  .  Endo: Diabetes Follow-Up:  Heart/CV:  regular, no murmur.  Extremities:  No LE edema, feet without cuts or ulcers.  General  alert and oriented x3, NAD.  counseled for 20 minutes of this 25 minute visit.  .  ASSESSMENTS  .  Type 2 diabetes mellitus with hyperglycemia,  without long-term  current use of insulin - E11.65 (Primary)  .  Other hyperlipidemia - E78.4  .  Obesity (BMI 30-39.9) - E66.9  .  [name] is a 57 y/o female with obesity, hypothyroidism,  scleroderma, hld and uncontrolled T2DM. Returns for follow up  after an absence from care since 04/2016. Recent hgba1c was 10%  (09/14/17). [name] does not have her glucometer but reports fasting  blood sugars between 100-160 range with daytime readings rising  into the 200-300 range. She also remains on 10mg  of prednisone  daily. I have reviewed lifestyle measures and answered questions  related to diet. In terms of ongoing treatment I reviewed options  with her, she is not interested in insulin therapy and we will  start with the addition of a daily SGLT2i. I will inquire with  her insurance to see what is covered. Recommended close f/u to  monitor progress. I also discussed blood glucose goals, aim for  bg readings <180 with an initial goal of getting the hgb a1c  under 8%. She will try to check multiple blood sugars daily and  bring her glucometer to her follow up visit. May consider  utilizing a professional cgm for 2 weeks if I am lacking blood  glucose data at f/u. If limited glycemic improvement with added  oral therapy then I would recommend starting basal insulin.  Weight is down from prior visit. BMI currently 32. States she has  lost 10lbs in recent months. No sxs of polyuria, polydipisa or  unintentional weight loss to suggest this is related to blood  sugars. Back on high potency statin, rosuvastatin. Annual lipids  at goal.  .  TREATMENT  .  Type 2 diabetes mellitus with hyperglycemia,  without long-term current use of insulin  Continue GlipiZIDE XL Tablet Extended Release 24 Hour, 2.5 MG, 1  tablet, Orally, Once a day  Continue Glucophage XR Tablet Extended Release 24 Hour, 500 mg, 4  tablets, Orally, Once a day  Start Jardiance Tablet, 10 MG, 1 tablet, Orally, Once a day, 90  days, 90, Refills 3  .  .  Other  hyperlipidemia  Continue Rosuvastatin Calcium Tablet, 10 MG, 1 tablet, Orally,  Once a day  .  [name]  Others  Notes: 1) Please remember to do your repeat blood tests at least  one week prior to your next scheduled follow up visit with [facility]. If blood tests are not done in advance of the  appointment or if an appointment is canceled/missed, you will be  notified of test results via mail. 2) Please check your  medications and notify us as soon as possible if you need  refills, with at least one week's notice. We cannot guarantee  that refill requests can be processed in a timely fashion sooner  than this. , 3) Please remember to bring your blood sugar meter  and/or logs to every office visit., 4) Please allow at least 24  hours for any required return phone call. I am in clinic or  available by phone Tuesday-Friday. I am out of the clinic on  Mondays. If your issue or concern is urgent you may ask to speak  to the doctor on call if I am not available.  .  FOLLOW UP  .  2 mos  .  [name]  Appointment Provider: [name], NP  .  Electronically signed by Broadus John , MD, PhDx  on 10/03/2017 at 10:24 AM EDT  .  CONFIRMATORY SIGN OFF  .  [name]  Document electronically signed by [name]    .

## 2017-10-02 NOTE — Progress Notes (Signed)
* * *        [name]**    --- ---    57 Y old Female, DOB: November 02, 1960, External MRN: [id number]    Account Number: [id number]    [address] [place]    Home: [phone]    Guarantor: [name] Insurance: [id number] NH[employer] PPO    PCP: [name] Referring: [name]    Appointment Facility: Endocrinology        * * *    10/02/2017   **Appointment Provider:** [name], NP **CHN#:** [id number]    --- ---      **Supervising Provider:** Delora Fuel, MD, PhD    ---         **Reason for Appointment**    ---       1\. FOLLOW UP PER DR MOULI    ---       **History of Present Illness**    ---     _General_ :    57 y/o female with scleroderma (on prednisone), IgG-4 deficiency, NASH and  uncontrolled T2DM. Seen for initial visit on 03/15/16 for management of  hyperglycemia in diabetes. No Endocrine visit since 04/2016. In the interim has  continued to check blood sugars at home, take metformin and now glipizide is  reduced to 2.5mg  as she reports hypoglycemia episodes with the 5mg  dose.    Diabetes Meds:    glipizide 2.5mg     metformin 2000mg  daily    no previous medications or insulin injections    -----    reports hypoglycemia 40-60's range on a higher dose of glipizide-5mg , 10mg .    A1c Trend:    10%-----09/14/17    8.7%-----03/02/16    11%-----08/05/15    7.1%-----11/04/14    7%-----12/31/13    6.6%-----05/06/13.    Recent Blood Glucoses:    no glucometer today    endorses fasting bg 100-160 range    daytime readings 200-300.    Diet:    3 meals daily, low CHO    working on reducing fat/cho portions--seen by Nutrition 04/2016.    Exercise:    walks 1-2 mi daily with dog.    Microvascular Complications Screening:    eyes: no acute vision changes, no hx of retinopathy, reports annual exam with  ophthalmologist is scheduled    kidneys: creat:0.91, egfr 71 (09/14/17), **uACR:137 (06/2017)**    feet: denies numbess or parethesias in feet, seen by Dr. Vassie Loll- hammering of  the lesser toes, given rx for custom  orthotics.    Macrovascular Complications Screening:    no hx of MI, CVA or PVD    (+) hld    Lipids: TChol: 158, TG:110, HDL:67, LDL:69, Chol/HDL ratio: 2.3% (01/2017)    on rosuvastatin.      **Current Medications**    ---    Taking     * Baclofen 10 MG Tablet 1/2 tab Orally prn    ---    * Calcium Carbonate-Vitamin D 600-200 MG-UNIT Capsule 1 capsule with a meal Orally Twice a day    ---    * GlipiZIDE XL 2.5 MG Tablet Extended Release 24 Hour 1 tablet Orally Once a day    ---    * Glucophage XR 500 mg Tablet Extended Release 24 Hour 4 tablets Orally Once a day    ---    * Hydroxychloroquine Sulfate 200 mg Tablet 1 tablet with food or milk Orally Once a day    ---    * Ibuprofen 600 MG  Tablet 1 tablet with food or milk as needed Orally Three times a day    ---    * Levothyroxine Sodium 25 MCG Tablet 1 tablet Orally Once a day    ---    * Omeprazole 40 mg Capsule Delayed Release 1 capsule Orally Once a day    ---    * PredniSONE 10 MG Tablet 1 tablet Orally Once a day    ---    * Ranitidine HCl 150 mg Capsule 1 capsule at bedtime Orally Once a day    ---    * Rosuvastatin Calcium 10 MG Tablet 1 tablet Orally Once a day    ---    * Sulindac 150 mg Tablet 1 tablet with food Orally Twice a day    ---    * Tramadol HCl 50 MG Tablet 1 tablet Orally every 6 hrs    ---    Not-Taking/PRN    * Humira Pen 40 MG/0.8ML Pen-injector Kit 0.8 ml Subcutaneous every 2 weeks    ---    * Voltaren 1 % Gel 2 grams Transdermal twice daily, Notes: PRN    ---    Unknown    * Oxycodone HCl 5 mg Tablet 1 tablet Orally as needed for pain    ---    * Medication List reviewed and reconciled with the patient    ---       **Past Medical History**    ---       Scleroderma - CREST dx 2007.        ---    IgG4 deficiency s/p IVIG 2010 ----single infusion given preventively after  week of bilateral knee replacements at [facility] 2010.        ---    Fx left wrist in 1994.        ---    Blood clot at LUE in 2006 on a short course of Coumadin.        ---     Bilateral carpal tunnel syndrome s/p Lt carpal tunnel release and steroids  injection right--.        ---    Bone spur left foot.        ---    Scoliosis.        ---    Spinal stenosis s/p steroids injection.        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 Katrinka Blazing 2010--- never infected, but packed with antibiotics with  surgery;.        ---    Right rotator cuff repairs x 4, complicated by repeat tears, infection,  placement of anchor material.        ---    Elevated liver function tests.        ---    Diabetes.        ---       **Surgical History**    ---       Right rotator cuff repair, with infected hardware that had to be removed  2006    ---    Repeat right shoulder surgery, also which became infected. 2007    ---    Left rotator cuff repair 2008    ---    bilateral knee replacements 2009    ---    Left arthroscopic carpal tunnel release 01/2011    ---    ORIF Left 4th metatarsal bone    ---    Left Ulna  shortening 1994    ---    Knee replacement 2010    ---    Hand surgery 2013, 2015    ---    remove gallbladder/hernia 1990    ---    Plate left foot 3rd metatarsal 2008    ---       **Family History**    ---       Mother: deceased 67 yrs, lung cancer, hyperthyroidism, diagnosed with  Cancer    ---    Father: deceased 34s yrs, heart attack, Heart Disease    ---    3 brother(s) .    ---    FH of arthritis and scleroderma\nFather deceased from MI\nMother deceased age  33 with lung cancer \\nBrother  with scleroderma \/ copd.    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    Alcohol    _Former daily EtOH use in 20s_    Tobacco history: Never smoked.   Nonsmoker.    Lives with longstanding boyfriend.    ---       **Hospitalization/Major Diagnostic Procedure**    ---       as above    ---      **Review of Systems**    ---     _Diabetes_ :    General: no unintended weight change, no fevers, malaise, weakness. Eyes:  no  vision loss. Cardiovascular: no CP, no orthopnea, no palpitations.  Respiratory: no wheezing, no dyspnea. Gastrointestinal: no n/v, no abdominal  pain. Musculoskeletal: no edema. Skin: no rash. Neurological: no numbness.          **Vital Signs**    ---    Pain scale 5, Ht-in 62, Wt-lbs 180, BMI 32.92, BP 120/80, HR 80, BSA 1.89, Ht-  cm 157.48, Wt-kg 81.65, Wt Change -2 lb.       **Physical Examination**    ---     _Endo: Diabetes Follow-Up_ :    Heart/CV: regular, no murmur.    Extremities: No LE edema, feet without cuts or ulcers.    General alert and oriented x3, NAD.    counseled for 20 minutes of this 25 minute visit.       **Assessments**    ---    1\. Type 2 diabetes mellitus with hyperglycemia, without long-term current use  of insulin - E11.65 (Primary)    ---    2\. Other hyperlipidemia - E78.4    ---    3\. Obesity (BMI 30-39.9) - E66.9    ---      Ms. Pleitez is a 57 y/o female with obesity, hypothyroidism, scleroderma, hld  and uncontrolled T2DM. Returns for follow up after an absence from care since  04/2016. Recent hgba1c was 10% (09/14/17). Aden does not have her glucometer  but reports fasting blood sugars between 100-160 range with daytime readings  rising into the 200-300 range. She also remains on 10mg  of prednisone daily. I  have reviewed lifestyle measures and answered questions related to diet. In  terms of ongoing treatment I reviewed options with her, she is not interested  in insulin therapy and we will start with the addition of a daily SGLT2i. I  will inquire with her insurance to see what is covered. Recommended close f/u  to monitor progress. I also discussed blood glucose goals, aim for bg readings  <180 with an initial goal of getting the hgb a1c under 8%. She will try to  check multiple blood sugars daily and bring her glucometer to her follow up  visit. May consider utilizing a professional cgm for 2 weeks if I am lacking  blood glucose data at f/u. If limited glycemic improvement  with added oral  therapy then I would recommend starting basal insulin.    Weight is down from prior visit. BMI currently 32. States she has lost 10lbs  in recent months. No sxs of polyuria, polydipisa or unintentional weight loss  to suggest this is related to blood sugars.    Back on high potency statin, rosuvastatin. Annual lipids at goal.    ---       **Treatment**    ---       **1\. Type 2 diabetes mellitus with hyperglycemia, without long-term  current use of insulin**    Continue GlipiZIDE XL Tablet Extended Release 24 Hour, 2.5 MG, 1 tablet,  Orally, Once a day    Continue Glucophage XR Tablet Extended Release 24 Hour, 500 mg, 4 tablets,  Orally, Once a day    Start Jardiance Tablet, 10 MG, 1 tablet, Orally, Once a day, 90 days, 90,  Refills 3    ---         **2\. Other hyperlipidemia**    Continue Rosuvastatin Calcium Tablet, 10 MG, 1 tablet, Orally, Once a day         **3\. Others**    Notes: 1) Please remember to do your repeat blood tests at least one week  prior to your next scheduled follow up visit with [facility]. If blood  tests are not done in advance of the appointment or if an appointment is  canceled/missed, you will be notified of test results via mail. 2) Please  check your medications and notify us as soon as possible if you need refills,  with at least one week's notice. We cannot guarantee that refill requests can  be processed in a timely fashion sooner than this. , 3) Please remember to  bring your blood sugar meter and/or logs to every office visit., 4) Please  allow at least 24 hours for any required return phone call. I am in clinic or  available by phone Tuesday-Friday. I am out of the clinic on Mondays. If your  issue or concern is urgent you may ask to speak to the doctor on call if I am  not available.      **Follow Up**    ---    2 mos    **Appointment Provider:** [name], NP    Electronically signed by Broadus John , MD, PhDx on 10/03/2017 at 10:24 AM  EDT    Sign off  status: Completed        * * *        Endocrinology    [address]    [place], [address]    [place], [place] [address]    Tel: [phone]    Fax: [phone]              * * *          Patient: [name], [name] DOB: 1961-03-26 Progress Note: [name], NP  10/02/2017    ---    Note generated by eClinicalWorks EMR/PM Software ([email or web address])

## 2017-10-10 ENCOUNTER — Ambulatory Visit: Admitting: Rheumatology

## 2017-10-10 NOTE — Progress Notes (Signed)
* * *        [name]**    --- ---    48 Y old Female, DOB: 1960-12-28    [address] [place], [place] [address]    Home: [phone]    Provider: [name]        * * *    Telephone Encounter    ---    Answered by   Orson Slick  Date: 10/10/2017         Time: 03:31 PM    Reason   Medical PA approved: Gamunex-C Exp. 10/19/19    --- ---            Message                      Viviann Spare from [facility]:  [phone]. States that new order is needed for IVIG.                 Action Taken                      [name],[name]  10/10/2017 3:32:03 PM > Interchangable with hyqvia or hizentra. Would [name] like to change or just hold infusion, gammagard is on national back order.  Call [phone] ext [phone] Dina. emailed Vishwa Dais to see what he would like to do      [name],[name]  10/12/2017 3:11:02 PM > [name] responded and said it is ok to change to different med      [name],[name] , [name] 10/12/2017 3:47:20 PM > Will touch base with Dr. Donneta Romberg about SC formulations.      [name],[name] , [name] 10/18/2017 9:41:35 AM > LVM for [facility] to confirm patient dosage and next infusion date.       [name],[name] , [name] 10/18/2017 2:17:37 PM > Spoke to [place] at [facility] who states gammagard and gamunex are both on back order. Patient gets 400mg /kg (20g) every 4 weeks (last dose 6/14). Discussed switch to SC (Hizentra vs Hyqvia). She will fax me the blank order forms. LVM for patient t see if she wants to come to [facility] for [employer] (lives in [place]).       [name],[name] , [name] 10/18/2017 3:33:57 PM > Spoke to [facility] at [facility]) and confirmed SC dosing for Hizentra and Hyqvia - she is going to fax me the prepopulatd order form.       [name],[name] , [name] 10/19/2017 9:37:45 AM > Spoke to [facility] and explained differences in formulations. She is willing to come into the hospital for infusions but does not think her insurance covers in hospital infusions.       [name] - Can you see if her medical insurance will cover Gamunex-C 400mg /kg  (20g) every 4 weeks for treatment of Igg4 Deficiency? Please submit as urgent a the patient is overdue. thanks!       Solomon,[name]  10/19/2017 11:17:39 PM > PA submitted as urgnet      Solomon,[name]  10/22/2017 9:46:49 AM > PA approved for Gamunex-C per [employer] [facility] [phone], effective from 10/19/17 to 10/19/19. UX#[id number].      [name],[name] , [name] 10/22/2017 10:41:01 AM > Thanks! I will send the order to the IC. Spoke to the patient about switch from Gammagard to [facility] and increased risk of side effects. Patient verbalized understanding. Spoke to [place] at [facility] [phone]) to cancel care with [facility] for now and told them to notify us if the shortage ends. Faxed letter  stating this as well [phone]).                     * * *                ---          * * *          PatientFia [name], [name] DOB: [birth year] Provider: [name] 10/10/2017    ---    Note generated by eClinicalWorks EMR/PM Software ([email or web address])

## 2017-10-23 ENCOUNTER — Ambulatory Visit

## 2017-10-24 ENCOUNTER — Ambulatory Visit: Admitting: Rheumatology

## 2017-10-24 NOTE — Progress Notes (Signed)
* * *        **  [name]**    --- ---    39 Y old Female, DOB: 09-Apr-1960    [address] [place], [place] [address]    Home: [phone]    Provider: [name]        * * *    Telephone Encounter    ---    Answered by   Orson Slick  Date: 10/24/2017         Time: 04:35 PM    Reason   [facility] update    --- ---            Message                      Misty Stanley from [facility] is calling for update on [name]. Call back at [phone]                Action Taken                      [name],[name]  10/24/2017 4:35:47 PM > I can also call for you if you jot it down below      [name],[name] , [name] 10/25/2017 8:52:11 AM > LVM for Misty Stanley [facility]), sent letter previously to [facility] as well stating the pt will get IVIG at [facility] for now.                     * * *                ---          * * *          Patient: [name], [name] DOB: 02-11-1961 Provider: [name] 10/24/2017    ---    Note generated by eClinicalWorks EMR/PM Software ([email or web address])

## 2017-10-26 ENCOUNTER — Ambulatory Visit

## 2017-10-26 MED ORDER — GLIPIZIDE: 1 | 90 | 1 refills | 0 days | Status: AC

## 2017-11-02 ENCOUNTER — Ambulatory Visit (HOSPITAL_BASED_OUTPATIENT_CLINIC_OR_DEPARTMENT_OTHER)

## 2017-11-02 ENCOUNTER — Ambulatory Visit: Admitting: Rheumatology

## 2017-11-02 ENCOUNTER — Ambulatory Visit

## 2017-11-02 ENCOUNTER — Ambulatory Visit: Admit: 2017-11-02 | Payer: No Typology Code available for payment source

## 2017-11-02 LAB — HX HEM-ROUTINE
HX BASO #: 0 10*3/uL (ref 0.0–0.2)
HX BASO: 1 %
HX EOSIN #: 0.1 10*3/uL (ref 0.0–0.5)
HX EOSIN: 1 %
HX HCT: 42 % (ref 32.0–45.0)
HX HGB: 13.9 g/dL (ref 11.0–15.0)
HX IMMATURE GRANULOCYTE#: 0 10*3/uL (ref 0.0–0.1)
HX IMMATURE GRANULOCYTE: 1 %
HX LYMPH #: 0.9 10*3/uL — ABNORMAL LOW (ref 1.0–4.0)
HX LYMPH: 10 %
HX MCH: 31.4 pg (ref 26.0–34.0)
HX MCHC: 33.1 g/dL (ref 32.0–36.0)
HX MCV: 95 fL (ref 80.0–98.0)
HX MONO #: 0.4 10*3/uL (ref 0.2–0.8)
HX MONO: 5 %
HX MPV: 10.8 fL (ref 9.1–11.7)
HX NEUT #: 7.6 10*3/uL — ABNORMAL HIGH (ref 1.5–7.5)
HX NRBC #: 0 10*3/uL
HX NUCLEATED RBC: 0 %
HX PLT: 199 10*3/uL (ref 150–400)
HX RBC BLOOD COUNT: 4.42 M/uL (ref 3.70–5.00)
HX RDW: 13.6 % (ref 11.5–14.5)
HX SEG NEUT: 84 %
HX WBC: 9 10*3/uL (ref 4.0–11.0)

## 2017-11-02 LAB — HX CHEM-PANELS
HX ANION GAP: 11 (ref 3–14)
HX BLOOD UREA NITROGEN: 20 mg/dL (ref 6–24)
HX CHLORIDE (CL): 104 meq/L (ref 98–110)
HX CO2: 21 meq/L (ref 20–30)
HX CREATININE (CR): 0.82 mg/dL (ref 0.57–1.30)
HX GFR, AFRICAN AMERICAN: 94 mL/min/{1.73_m2}
HX GFR, NON-AFRICAN AMERICAN: 81 mL/min/{1.73_m2}
HX POTASSIUM (K): 4.3 meq/L (ref 3.6–5.1)
HX SODIUM (NA): 136 meq/L (ref 135–145)

## 2017-11-02 LAB — HX CHEM-LFT
HX ALANINE AMINOTRANSFERASE (ALT/SGPT): 190 IU/L — ABNORMAL HIGH (ref 0–54)
HX ALKALINE PHOSPHATASE (ALK): 424 IU/L — ABNORMAL HIGH (ref 40–130)
HX ASPARTATE AMINOTRANFERASE (AST/SGOT): 123 IU/L — ABNORMAL HIGH (ref 10–42)
HX BILIRUBIN, TOTAL: 1.3 mg/dL — ABNORMAL HIGH (ref 0.2–1.1)
HX LACTATE DEHYDROGENASE (LDH): 287 IU/L — ABNORMAL HIGH (ref 120–220)

## 2017-11-02 LAB — HX IMMUNOLOGY: HX C-REACTIVE PROTEIN - CRP: 8.67 mg/L — ABNORMAL HIGH (ref 0.00–7.48)

## 2017-11-02 LAB — HX HEM-MISC: HX SED RATE: 49 mm — ABNORMAL HIGH (ref 0–30)

## 2017-11-02 NOTE — Progress Notes (Signed)
* * *        **  [name]**    --- ---    72 Y old Female, DOB: Oct 09, 1960    [address], [place], [place] [address]    Home: [phone]    Provider: [name]        * * *    Telephone Encounter    ---    Answered by   Leotis Shames  Date: 11/02/2017         Time: 09:09 AM    Caller   pt    --- ---            Reason   question about infusion            Message                      She has infusion today (got this msg from yest). Wanted to ask about it.                 Action Taken                      [name],[name] , [name] 11/02/2017 10:00:36 AM > The patient called stating she spoke to billing who quoted her infusion $300 each time since it is considered an outpatient procedure. We discussed that unfortunately, due to how the medication works and her indication, we are not able to double her dose and give it less frequently. I did let her know that I notified her home infusion company to keep me updated on the shortage problem and if the medication comes back in stock, we can send her back to home infusion. The patient is okay with this plan. She is coming in for her infusion at 10:30.                     * * *                ---          * * *          PatientSakshi Sermons, Shilo DOB: [birth year] Provider: [name] 11/02/2017    ---    Note generated by eClinicalWorks EMR/PM Software ([email or web address])

## 2017-11-02 NOTE — Progress Notes (Signed)
* * *        **  [name]**    --- ---    70 Y old Female, DOB: February 02, 1961    [address], [place], [place] [address]    Home: [phone]    Provider: [name]        * * *    Telephone Encounter    ---    Answered by   Peter Congo  Date: 11/02/2017         Time: 12:09 PM    Reason   IVIG (gamunex-c) infusion    --- ---            Message                      I saw the patient in the infusion center today. This is her first day receiving the Gamunex-C at [facility] (has been on Gammagard through home infusion). The patient is tolerating the medication well so far. We spoke about increased risk of side effects dosing the patient verblized understaning. I answered all the patients questions to her satisfaction. We will see her back in 4 more weeks.                Action Taken                      [name],[name] , [name] 11/02/2017 12:11:03 PM >                     * * *                ---          * * *          PatientDELENA, CASEBEER DOB: 05/18/1960 Provider: [name] 11/02/2017    ---    Note generated by eClinicalWorks EMR/PM Software ([email or web address])

## 2017-11-22 ENCOUNTER — Ambulatory Visit

## 2017-11-22 MED ORDER — GLUCOSE BLOOD: 100 | 0 refills | 0 days | Status: AC

## 2017-11-22 MED ORDER — LANCETS: 300 | 1 refills | 0 days | Status: AC

## 2017-11-22 MED ORDER — PREDNISONE: 1 | 180 | 0 refills | 0 days | Status: DC

## 2017-11-22 MED ORDER — GLUCOSE BLOOD: 100 | 3 refills | 0 days | Status: AC

## 2017-11-29 ENCOUNTER — Ambulatory Visit: Admitting: Rheumatology

## 2017-11-29 NOTE — Progress Notes (Signed)
* * *        **  [name]**    --- ---    77 Y old Female, DOB: Aug 12, 1960    [address], [place], [place] [address]    Home: [phone]    Provider: [name]        * * *    Telephone Encounter    ---    Answered by   Cindie Crumbly  Date: 11/29/2017         Time: 03:11 PM    Caller   [name]    --- ---            Reason   [facility] Call back            Message                      Good afternoon            [name] is calling on the behalf of the patient. [name] wants to know if Dr [name] can resend the IVIG orders so the patient can be seen at [facility] of [facility] so the patient don't have to pay out of pocket 500 dollars. Please call [facility] at [phone] to assist.            Thank You                Action Taken                      Cindie Crumbly  11/29/2017 3:16:18 PM >       [name],[name] , [name] 11/29/2017 3:38:50 PM > Spoke to [facility]) at [facility] who states the IVIG shortage is over so they can transfer the patient back to home infusion. The patients infusion is tomorrow so I will send the order today. Canceling patient infusion appt at [facility]. Patient notified and she states she has been in contact with [facility] to have the infusion on Monday.                     * * *                ---          * * *          Patient: [name], [name] DOB: [birth year] Provider: [name] 11/29/2017    ---    Note generated by eClinicalWorks EMR/PM Software ([email or web address])

## 2017-11-30 ENCOUNTER — Ambulatory Visit

## 2017-11-30 ENCOUNTER — Ambulatory Visit: Admitting: Internal Medicine

## 2017-12-21 ENCOUNTER — Ambulatory Visit

## 2017-12-21 NOTE — Progress Notes (Signed)
* * *        **  [name]**    --- ---    19 Y old Female, DOB: 10/25/1960    [address] [place], [place] [address]    Home: [phone]    Provider: Orson Slick        * * *    Telephone Encounter    ---    Answered by   Oletha Blend  Date: 12/21/2017         Time: 04:08 PM    Reason   Prednisone refill    --- ---            Message                      patient called for Prednsione refill but hasnt been seen,can i schedule with you?                Action Taken                      Rahim Astorga  12/21/2017 4:56:56 PM > sounds good! Ok to schedule!      [name],[name]  12/24/2017 11:25:14 AM > spoke with patient and she is schedu;led for 10/4 with you, can you send enought ot get her to the appointment, she is on 10mg  2x a day      Jaiyon Wander  12/24/2017 12:07:15 PM > faxed                Refills  Refill PredniSONE Tablet, 10 MG, Orally, 60 Tablet, 2 tablet, Once a  day, 30 days, Refills=0    --- ---          * * *                ---          * * *          PatientSuan [name] DOB: 1961/01/13 Provider: Orson Slick 12/21/2017    ---    Note generated by eClinicalWorks EMR/PM Software ([email or web address])

## 2017-12-24 MED ORDER — PredniSONE: 10 | Tablet | Freq: Every day | 0 refills | 0 days | Status: AC

## 2017-12-25 ENCOUNTER — Ambulatory Visit

## 2018-01-04 ENCOUNTER — Ambulatory Visit: Admitting: Rheumatology

## 2018-01-04 ENCOUNTER — Ambulatory Visit: Admitting: Adult Health

## 2018-01-04 ENCOUNTER — Ambulatory Visit

## 2018-01-04 ENCOUNTER — Ambulatory Visit: Admitting: "Endocrinology

## 2018-01-04 ENCOUNTER — Ambulatory Visit: Admit: 2018-01-04 | Payer: No Typology Code available for payment source

## 2018-01-04 LAB — HX CHEM-LIPIDS
HX CHOL-HDL RATIO: 3
HX CHOLESTEROL: 206 mg/dL — ABNORMAL HIGH (ref 110–199)
HX HIGH DENSITY LIPOPROTEIN CHOL (HDL): 69 mg/dL (ref 35–75)
HX HOURS FAST: 2 h
HX LDL: 110 mg/dL (ref 0–129)
HX TRIGLYCERIDES: 133 mg/dL (ref 40–250)

## 2018-01-04 LAB — HX IMMUNOLOGY
HX HEPATITIS B SURFACE ANTIBODY: 53.6 m[IU]/mL — ABNORMAL HIGH (ref 0.0–7.9)
HX HEPATITIS B SURFACE ANTIGEN: NONREACTIVE
HX HEPATITIS C ANTIBODY: NONREACTIVE

## 2018-01-04 LAB — HX BF-CHEM/URINE
HX ALBUMIN RANDOM URINE: 1.4 mg/dL
HX ALBUMIN/CREATININE RATIO, URINE: 28 mg/g (ref 0–30)
HX CREATININE, RANDOM URINE: 50.04 mg/dL
HX MICROALBUMIN CALC: 0.03 mg/mg

## 2018-01-04 LAB — HX CHOLESTEROL
HX CHOLESTEROL: 206 mg/dL — ABNORMAL HIGH (ref 110–199)
HX HIGH DENSITY LIPOPROTEIN CHOL (HDL): 69 mg/dL (ref 35–75)
HX LDL: 110 mg/dL (ref 0–129)
HX TRIGLYCERIDES: 133 mg/dL (ref 40–250)

## 2018-01-04 LAB — HX OTHER TESTS: HX HEPATITIS C ANTIBODY: NONREACTIVE

## 2018-01-04 LAB — HX POINT OF CARE: HX HGB A1C, POC: 7.7 % — ABNORMAL HIGH

## 2018-01-04 LAB — HX CHEM-VITAMIN: HX VITAMIN B12: 2000 pg/mL — ABNORMAL HIGH (ref 211–911)

## 2018-01-04 LAB — HX DIABETES: HX ALBUMIN RANDOM URINE: 1.4 mg/dL

## 2018-01-04 MED ORDER — PredniSONE: 10 | Tablet | Freq: Every day | 3 refills | 0 days | Status: AC

## 2018-01-04 NOTE — Progress Notes (Signed)
.  Progress Notes  .  Patient: [name]  Provider: Orson Slick    .  DOB: [birth year] Age: 57 Y Sex: Female  .  PCP: [name]  MD  Date: 01/04/2018  .  --------------------------------------------------------------------------------  .  REASON FOR APPOINTMENT  .  1. Rheumatoid arthritis/IVIg deficiency  .  HISTORY OF PRESENT ILLNESS  .  GENERAL:   Today Ms. [name] presents for follow up for IVIg deficiency and  rheumatoid arthritis. She continues to do well on IVIg without  adverse reaction. She is scheduled for her next infusion today.  She states that her hand pain continues to be present with  swelling and morning stiffness for 45 minutes. She notes aches  and pains everywhere, but pain in the hands is the most  distracting and cannot close them. She was previously taking 10mg   daily which has helped, but has not been taking it for the past 3  weeks because refill was not sent in, which has resulted in  worsening RA symptoms. At her last visit, there was discussion w/  Dr. Lorella [name] of adding on Humira vs. [place]. She was previously on  humira from 08/2016 to 10/2016 before discontinuation due to  shingles. She has now recieved the first shingrix vaccine and  will get the second one soon. She does believe she felt some  alleviation of joint pain while on humira, although a short  course. She also expresses concern for changes in her left 3rd  finger nail with a deep ridge and discoloration over the past  week or so.  [name]  CURRENT MEDICATIONS  .  Taking Baclofen 10 MG Tablet 1/2 tab Orally prn  Taking Calcium Carbonate-Vitamin D 600-200 MG-UNIT Capsule 1  capsule with a meal Orally Twice a day  Taking GlipiZIDE XL 5 MG Tablet Extended Release 24 Hour 1 tablet  Orally Once a day  Taking Glucophage XR 500 mg Tablet Extended Release 24 Hour 4  tablets Orally Once a day  Taking Hydroxychloroquine Sulfate 200 mg Tablet 1 tablet with  food or milk Orally Once a day  Taking Levothyroxine Sodium 25 MCG Tablet 1 tablet  Orally Once a  day  Taking Omeprazole 40 mg Capsule Delayed Release 1 capsule Orally  Once a day  Taking PredniSONE 10 MG Tablet 2 tablet Orally Once a day  Taking Ranitidine HCl 150 mg Capsule 1 capsule at bedtime Orally  Once a day  Taking Rosuvastatin Calcium 10 MG Tablet 1 tablet Orally Once a  day  Taking Sulindac 150 mg Tablet 1 tablet with food Orally Twice a  day  Taking Tramadol HCl 50 MG Tablet 1 tablet Orally every 6 hrs  Not-Taking/PRN Humira Pen 40 MG/0.8ML Pen-injector Kit 0.8 ml  Subcutaneous every 2 weeks  Not-Taking/PRN Voltaren 1 % Gel 2 grams Transdermal twice daily  Medication List reviewed and reconciled with the patient  .  PAST MEDICAL HISTORY  .  Scleroderma - CREST dx 2007  IgG4 deficiency s/p IVIG 2010 ----single infusion given  preventively after week of bilateral knee replacements at [facility]  2010  Fx left wrist in 1994  Blood clot at LUE in 2006 on a short course of Coumadin  Bilateral carpal tunnel syndrome s/p Lt carpal tunnel release and  steroids injection right--  Bone spur left foot  Scoliosis  Spinal stenosis s/p steroids injection  s/p bilateral knee replacements for valgus /arthritic  complications, performed by Dr. Katrinka Blazing 2010--- never infected, but  packed with antibiotics with  surgery;  Right rotator cuff repairs x 4, complicated by repeat tears,  infection, placement of anchor material  Elevated liver function tests  Diabetes  .  ALLERGIES  .  N.K.D.A.  .  SURGICAL HISTORY  .  Right rotator cuff repair, with infected hardware that had to be  removed 2006  Repeat right shoulder surgery, also which became infected. 2007  Left rotator cuff repair 2008  bilateral knee replacements 2009  Left arthroscopic carpal tunnel release 01/2011  ORIF Left 4th metatarsal bone  Left Ulna shortening 1994  Knee replacement 2010  Hand surgery 2013, 2015  remove gallbladder/hernia 1990  Plate left foot 3rd metatarsal 2008  .  FAMILY HISTORY  .  Mother: deceased 26 yrs, lung cancer, hyperthyroidism,  diagnosed  with Cancer  Father: deceased 86s yrs, heart attack, Heart Disease  3 brother(s) .  FH of arthritis and scleroderma\nFather deceased from MI\nMother  deceased age 81 with lung cancer \\nBrother  with scleroderma \/  copd.  .  SOCIAL HISTORY  .  [name]  Work/Occupation: Production designer, theatre/television/film at [employer].  .  .  Alcohol  Former daily EtOH use in 20s  .  [name]  Tobacco  history: Never smoked.  .  Nonsmoker.Lives with longstanding boyfriend.  [name]  HOSPITALIZATION/MAJOR DIAGNOSTIC PROCEDURE  .  as above  .  REVIEW OF SYSTEMS  .  Rheumatology:  .  General    No fever, weight loss, swollen glands, fatigue .  Muscoskeletal    +joint pain or swelling, morning stiffness,  muscle pain . Eyes    No vision loss, eye dry, mouth dry, red  eye, eye pain . Mouth    No dry mouth , mouth sores .  Cardiovascular    +swelling/whitening of hands/feet . Pulmonary     No cough, cough blood, shortness of breath . Gastrointestinal     No Abdominal pain, N/v, diarrhea, constipation, bloody stools,  heartburn . Genitoruinary    No difficulty urinating, bloody  urine, pain with urination . Skin    No rash, photosensitivity .  Lymphatics    No swollen glands, tender glands . Neurological     No seizures, headache, paresthesias, localized weakness . Mental  health    No anxiety, depression, sleep disturbance .  [name]  VITAL SIGNS  .  Pain scale 5.5, Ht-in 62, Wt-lbs 175, BMI 32.00, BP 111/74, HR  83, BSA 1.86, Ht-cm 157.48, Wt-kg 79.38, Wt Change -2 lb.  [name]  EXAMINATION  .  Rheumatology:  General Appearance Alert and oriented , No apparent distress .  [name]  HENT: No, alopecia .  [name]  Eyes: No, scleral icterus, scleral erythema .  [name]  CV: regular rate rhythm .  [name]  Ext: trace LE edema .  [name]  Skin: No, rash .  [name]  Psych: No anxiety depression.  .  MuskuloskeletalElbows, shoulders, hips, knees have intact ROM  with no synovitis. Hands have synovitis in the PIPs and MCPs  bilaterally..  .  Skin and Nails Minimal sclerodactyly, deep ridge at base of left  3rd nail with  yellow discoloration.  [name]  RAPID 3: Function  (0-10): 0.7  Pain  (0-10):5.5  Patient Global  (0-10):1.0  .  ASSESSMENTS  .  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  Long-term use of high-risk medication - Z79.899  .  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  Onychomycosis - B35.1, Discussed difficulty in  treating. Begin  OTC tx and monitor.  .  She continues to have ongoing synovitis however I think the IVIG  has improved her sxs somewhat as well as suppressed her Zoster.  After further discussion, we will begin the process for adding on  Orencia for additional treatment while she continues with IVIg.  We will also put her back on prednisone 10mg  daily for symptom  control in the meantime, with hopes of tapering if Orencia helps.  We will check labs in order to initiate treatment with Orencia,  otherwise no additional labs needed today. R/B/A of Orencia  discussed in detail during today's visit with our pharmacist,  Zoe. She will follow up with [place] in 3 months, or sooner if needed.  This plan was discussed in great detail with Ms. Garfield, to which  she is agreeable.  .  TREATMENT  .  Seronegative rheumatoid arthritis  Refill PredniSONE Tablet, 10 MG, 1 tablet, Orally, Once a day, 30  days, 30 Tablet, Refills 3, Notes: Currently taking 10mg /day  LAB: HCV Hepatitis C Antibody (HCV)  Hepatitis C Antibody     Non Reactive     (Non Reactive - )  .  [name],[name] 01/04/2018 11:39:01 AM >  .  .  LAB: HBV Hepatitis B Surface Antigen (HBSAG)  Hepatitis B Surface Antigen     Non Reactive     (Non Reactive -  )  .  [name],[name] 01/04/2018 11:39:01 AM >  .  .  LAB: HBV Hepatitis B Surface Antibody (HBSAB)  Hepatitis B Surface Antibody     53.6     (0.0 - 7.9 - mIU/mL)  .  [name],[name] 01/04/2018 11:39:01 AM >  .  .  LAB: Quantiferon-TB Gold Plus (QFER4)  .  FOLLOW UP  .  3 Months  .  Electronically signed by Orson Slick , PA-C on  01/04/2018 at 01:56 PM EDT  .  Document electronically  signed by Orson Slick    .

## 2018-01-04 NOTE — Progress Notes (Signed)
* * *      [name]**    ------    61 Y old Female, DOB: November 10, 1960    [address], [place], [place] [address]    Home: [phone]    Provider: Peter Congo, [name]        * * *    Telephone Encounter    ---    Answered by  Peter Congo Date: 01/04/2018       Time: 09:21 AM    Reason  *Initial Clinical Assessment _ Pharmacy    ------            Message                     See virtual visit                Action Taken                     [name],[name] , [name] 01/04/2018 9:21:54 AM > Hi [name], can you start a PA for Orencia 125mg  SC once weekly for tx of RA? Thanks!       Solomon,[name]  01/04/2018 12:32:55 PM > Hi Zoe, is this for Orencia Pen or PFS?      [name],[name] , [name] 01/04/2018 3:02:19 PM > Pen please! Thanks!            Solomon,[name]  01/04/2018 5:03:36 PM > PA denied. See other tel encounter.      [name],[name] , [name] 01/07/2018 9:04:30 AM > Thanks!                     * * *              * * *        ---      Reason for Appointment    ---     1\. *Initial Clinical Assessment _ Pharmacy    ---     History of Present Illness    ---     _GENERAL_ :    RA patient who has failed Humira (developed shingles and red marks around  injection site) and Enbrel. Medications and drug allergies reviewed face to  face with patient and updated in eCW. Medication reconciliation completed.  Patient was provided drug specific counseling and education on the following:  Orencia.     Current Medications    ---    Taking    * Baclofen 10 MG Tablet 1/2 tab Orally prn    ---    * Calcium Carbonate-Vitamin D 600-200 MG-UNIT Capsule 1 capsule with a meal Orally Twice a day    ---    * GlipiZIDE XL 2.5 mg Tablet Extended Release 24 Hour 1 tablet Orally Once a day    ---    * Glucophage XR 500 mg Tablet Extended Release 24 Hour 4 tablets Orally Once a day    ---    * Hydroxychloroquine Sulfate 200 mg Tablet 1 tablet with food or milk Orally Once a day    ---    * Levothyroxine Sodium 25 MCG Tablet 1 tablet Orally  Once a day    ---    * Magnesium Cl-Calcium Carbonate 71.5-119 MG Tablet Delayed Release 1 Orally Once a day, Notes: pt unsure strength    ---    * Omeprazole 40 mg Capsule Delayed Release 1 capsule Orally Once a day    ---    * PredniSONE  10 MG Tablet 2 tablet Orally Once a day, Notes: Currently taking 10mg /day    ---    * Ranitidine HCl 150 mg Capsule 1 capsule at bedtime Orally Once a day, Notes: PRN    ---    * Rosuvastatin Calcium 10 MG Tablet 1 tablet Orally Once a day    ---    * Tramadol HCl 50 MG Tablet 1 tablet Orally every 6 hrs, Notes: PRN    ---    * Vitamin D 1000 UNIT Tablet 1 tablet Orally Once a day, Notes: pt unsure strength    ---    * Voltaren 1 % Gel 2 grams Transdermal twice daily, Notes: PRN    ---    Discontinued    * Humira Pen 40 MG/0.8ML Pen-injector Kit 0.8 ml Subcutaneous every 2 weeks    ---    * Sulindac 150 mg Tablet 1 tablet with food Orally Twice a day    ---    * Medication List reviewed and reconciled with the patient    ---       **Medical History:**  Scleroderma - CREST dx 2007.    ---    IgG4 deficiency s/p IVIG 2010 ----single infusion given preventively after  week of bilateral knee replacements at [facility] 2010.    ---    Fx left wrist in 1994.    ---    Blood clot at LUE in 2006 on a short course of Coumadin.    ---    Bilateral carpal tunnel syndrome s/p Lt carpal tunnel release and steroids  injection right--.    ---    Bone spur left foot.    ---    Scoliosis.    ---    Spinal stenosis s/p steroids injection.    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 Katrinka Blazing 2010--- never infected, but packed with antibiotics with  surgery;.    ---    Right rotator cuff repairs x 4, complicated by repeat tears, infection,  placement of anchor material.    ---    Elevated liver function tests.    ---    Diabetes.    ---     Allergies    ---     N.K.D.A.    ---     Assessments    ---    1\. Patient understands importance of medication  adherence - Z78.9 (Primary)    ---    2\. 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    ---     Preventive Medicine    ---     Medication education and counseling provided and included the following:  treatment start date, medication name(Orencia), indications (RA),  duration(indefinite), administration instructions (spoke about injection  sites, rotating sites, inject 2 inches away fron navel etc), injection  technique training [pt successfully demonstrated injection technique back via  demo pen], goals of therapy, drug-drug interactions, drug-food interactions,  importance of medication adherence (spoke about risk of antibody developemnt,  adherence techniques, what to do if she has an infection/surgery/dental  procedure etc) medication dosing and frequency (125mg  SC weekly), missed dose  management, storage and handling requirements, adverse effects, warnings and  precautions (pt denies hx of COPD, spoke about injection site reaction, rash,  infection risk, headache, nausea, malignancy), and lab monitoring parameters  (pt will get baseline hep b/c and TB today), vaccines (avoiding live vaccines,  patient has received her flu vaccine and pneumonia vaccine).  Patient was  provided the following educational materials: adult patient education from  [place].    Patient verbalized and demonstrated understanding of all instructions provided  and has no further questions at this time. Medication regimen discussed with  provider and care team. I spent 15 minutes with the patient, 15 minutes of  which were spent counseling. Patient will continue to be followed for ongoing  medication education and adherence monitoring. My contact information was  provided for any questions.        ---     Follow Up    ---    4 Weeks (Reason: after patient started medication)          * * *         PatientCRISSA, SOWDER DOB: [birth year] Provider: Peter Congo, [name]  01/04/2018    ---    Note generated by eClinicalWorks EMR/PM Software  ([email or web address])

## 2018-01-04 NOTE — Progress Notes (Signed)
* * *        **  [name]**    --- ---    38 Y old Female, DOB: [birth year]    [address], [place], [place] [address]    Home: [phone]    Provider: [name]        * * *    Telephone Encounter    ---    Answered by   Orson Slick  Date: 01/04/2018         Time: 02:48 PM    Reason   Infusion update    --- ---            Message                      [facility]. Threw up about 75% of into infusion. felt better so restarted at slower rate, then began clammy again and threw up. Probably had about 7.5g left when stopped. after last episode, given benadryl.  She is on a total of 20 grams. She is due nov 1.                 Action Taken                      [name],[name]  01/04/2018 2:49:04 PM > [facility] main line [phone]      [name],[name]  01/10/2018 10:20:54 AM > [name] aware. LVM for pt to check in and see how she is doing                    * * *                ---          * * *          PatientSuan [name] DOB: 26-May-1960 Provider: [name] 01/04/2018    ---    Note generated by eClinicalWorks EMR/PM Software ([email or web address])

## 2018-01-04 NOTE — Progress Notes (Signed)
* * *        [name]**    --- ---    57 Y old Female, DOB: 05-01-1960, External MRN: [id number]    Account Number: [id number]    [address] [place], [place]    Home: [phone]    Guarantor: [name] Insurance: [id number] NHP PPO    PCP: [name], MD Referring: [name], MD    Appointment Facility: Rheumatology, Allergy and Immunology        * * *    01/04/2018  Progress Notes: Orson Slick, PA **CHN#:** [phone]    --- ---    ---         **Reason for Appointment**    ---       1\. Rheumatoid arthritis/IVIg deficiency    ---       **History of Present Illness**    ---     _GENERAL_ :    Today [name] presents for follow up for IVIg deficiency and rheumatoid  arthritis. She continues to do well on IVIg without adverse reaction. She is  scheduled for her next infusion today. She states that her hand pain continues  to be present with swelling and morning stiffness for 45 minutes. She notes  aches and pains everywhere, but pain in the hands is the most distracting and  cannot close them. She was previously taking 10mg  daily which has helped, but  has not been taking it for the past 3 weeks because refill was not sent in,  which has resulted in worsening RA symptoms. At her last visit, there was  discussion w/ [name] of adding on Humira vs. Dub Amis. She was previously  on humira from 08/2016 to 10/2016 before discontinuation due to shingles. She  has now recieved the first shingrix vaccine and will get the second one soon.  She does believe she felt some alleviation of joint pain while on humira,  although a short course. She also expresses concern for changes in her left  3rd finger nail with a deep ridge and discoloration over the past week or so.       **Current Medications**    ---    Taking     * Baclofen 10 MG Tablet 1/2 tab Orally prn    ---    * Calcium Carbonate-Vitamin D 600-200 MG-UNIT Capsule 1 capsule with a meal Orally Twice a day    ---    * GlipiZIDE XL 5 MG Tablet Extended Release 24  Hour 1 tablet Orally Once a day    ---    * Glucophage XR 500 mg Tablet Extended Release 24 Hour 4 tablets Orally Once a day    ---    * Hydroxychloroquine Sulfate 200 mg Tablet 1 tablet with food or milk Orally Once a day    ---    * Levothyroxine Sodium 25 MCG Tablet 1 tablet Orally Once a day    ---    * Omeprazole 40 mg Capsule Delayed Release 1 capsule Orally Once a day    ---    * PredniSONE 10 MG Tablet 2 tablet Orally Once a day    ---    * Ranitidine HCl 150 mg Capsule 1 capsule at bedtime Orally Once a day    ---    * Rosuvastatin Calcium 10 MG Tablet 1 tablet Orally Once a day    ---    * Sulindac 150 mg Tablet 1 tablet with food Orally Twice a day    ---    *  Tramadol HCl 50 MG Tablet 1 tablet Orally every 6 hrs    ---    Not-Taking/PRN    * Humira Pen 40 MG/0.8ML Pen-injector Kit 0.8 ml Subcutaneous every 2 weeks    ---    * Voltaren 1 % Gel 2 grams Transdermal twice daily    ---    * Medication List reviewed and reconciled with the patient    ---       **Past Medical History**    ---       Scleroderma - CREST dx 2007.        ---    IgG4 deficiency s/p IVIG 2010 ----single infusion given preventively after  week of bilateral knee replacements at [facility] 2010.        ---    Fx left wrist in 1994.        ---    Blood clot at LUE in 2006 on a short course of Coumadin.        ---    Bilateral carpal tunnel syndrome s/p Lt carpal tunnel release and steroids  injection right--.        ---    Bone spur left foot.        ---    Scoliosis.        ---    Spinal stenosis s/p steroids injection.        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 Katrinka Blazing 2010--- never infected, but packed with antibiotics with  surgery;.        ---    Right rotator cuff repairs x 4, complicated by repeat tears, infection,  placement of anchor material.        ---    Elevated liver function tests.        ---    Diabetes.        ---       **Surgical History**    ---       Right rotator cuff  repair, with infected hardware that had to be removed  2006    ---    Repeat right shoulder surgery, also which became infected. 2007    ---    Left rotator cuff repair 2008    ---    bilateral knee replacements 2009    ---    Left arthroscopic carpal tunnel release 01/2011    ---    ORIF Left 4th metatarsal bone    ---    Left Ulna shortening 1994    ---    Knee replacement 2010    ---    Hand surgery 2013, 2015    ---    remove gallbladder/hernia 1990    ---    Plate left foot 3rd metatarsal 2008    ---       **Family History**    ---       Mother: deceased 34 yrs, lung cancer, hyperthyroidism, diagnosed with  Cancer    ---    Father: deceased 34s yrs, heart attack, Heart Disease    ---    3 brother(s) .    ---    FH of arthritis and scleroderma\nFather deceased from MI\nMother deceased age  27 with lung cancer \\nBrother  with scleroderma \/ copd.    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    Alcohol    _Former daily EtOH  use in 20s_    Tobacco history: Never smoked.   Nonsmoker.    Lives with longstanding boyfriend.    ---       **Allergies**    ---       N.K.D.A.    ---       **Hospitalization/Major Diagnostic Procedure**    ---       as above    ---      **Review of Systems**    ---     _Rheumatology_ :    General No fever, weight loss, swollen glands, fatigue. Muscoskeletal **+  joint pain or swelling, morning stiffness, muscle pain**. Eyes No vision loss,  eye dry, mouth dry, red eye, eye pain. Mouth No dry mouth , mouth sores.  Cardiovascular **+swelling/whitening of hands/feet**. Pulmonary No cough,  cough blood, shortness of breath. Gastrointestinal No Abdominal pain, N/v,  diarrhea, constipation, bloody stools, heartburn. Genitoruinary No difficulty  urinating, bloody urine, pain with urination. Skin No rash, photosensitivity.  Lymphatics No swollen glands, tender glands. Neurological No seizures,  headache, paresthesias, localized weakness. Mental health No  anxiety,  depression, sleep disturbance.          **Vital Signs**    ---    Pain scale 5.5, Ht-in 62, Wt-lbs 175, BMI 32.00, BP 111/74, HR 83, BSA 1.86,  Ht-cm 157.48, Wt-kg 79.38, Wt Change -2 lb.       **Examination**    ---     _Rheumatology_ :    General Appearance  Alert and oriented , No apparent distress .    HENT:  No, alopecia .    Eyes:  No, scleral icterus, scleral erythema .    CV:  regular rate rhythm .    Ext:  trace LE edema .    Skin:  No, rash .    Psych:  No anxiety depression.    Muskuloskeletal Elbows, shoulders, hips, knees have intact ROM with no  synovitis. Hands have synovitis in the PIPs and MCPs bilaterally..    Skin and Nails  Minimal sclerodactyly, deep ridge at base of left 3rd nail  with yellow discoloration.    _RAPID 3_ :    Function    (0-10): _0.7_        Pain    (0-10): _5.5_        Patient Global    (0-10): _1.0_           **Assessments**    ---    1\. 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 Long-term use of high-risk medication - Z79.899    ---    3\. 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 Onychomycosis - B35.1, Discussed difficulty in treating. Begin OTC tx and  monitor.    ---      She continues to have ongoing synovitis however I think the IVIG has  improved her sxs somewhat as well as suppressed her Zoster. After further  discussion, we will begin the process for adding on Orencia for additional  treatment while she continues with IVIg. We will also put her back on  prednisone 10mg  daily for symptom control in the meantime, with hopes of  tapering if Orencia helps. We will check labs in order to initiate treatment  with Orencia, otherwise no additional labs needed today. R/B/A of Orencia  discussed in  detail during today's visit with our pharmacist, Zoe. She will  follow up with [place] in 3 months, or sooner if needed. This plan was discussed in  great detail with Ms. Ramaker, to which she is  agreeable.    ---       **Treatment**    ---       **1\. Seronegative rheumatoid arthritis**    Refill PredniSONE Tablet, 10 MG, 1 tablet, Orally, Once a day, 30 days, 30  Tablet, Refills 3, Notes: Currently taking 10mg /day    _LAB: HCV Hepatitis C Antibody (HCV)_   Hepatitis C Antibody  Non Reactive     Non Reactive -    --- --- --- ---      Notes :Jannely Henthorn 01/04/2018 11:39:01 AM >    --- ---    _LAB: HBV Hepatitis B Surface Antigen (HBSAG)_ Hepatitis B Surface Antigen   Non Reactive    Non Reactive -    --- --- --- ---      Notes :Amil Moseman 01/04/2018 11:39:01 AM >    --- ---    _LAB: HBV Hepatitis B Surface Antibody (HBSAB)_ Hepatitis B Surface Antibody   53.6  H  0.0 - 7.9 - mIU/mL    --- --- --- ---      Notes :Aldea Avis 01/04/2018 11:39:01 AM >    --- ---    Gerald Stabs: Olena Mater Plus (QFER4)_       **Follow Up**    ---    3 Months    Electronically signed by Orson Slick , PA-C on 01/04/2018 at 01:56 PM EDT    Sign off status: Completed        * * *        Rheumatology, Allergy and Immunology    [address]    [place] [address], [address] [place], [place] [address]    Tel: [phone]    Fax: [phone]              * * *          Patient: DESHONDA, CRYDERMAN DOB: [birth year] Progress Note: Orson Slick, PA  01/04/2018    ---    Note generated by eClinicalWorks EMR/PM Software ([email or web address])

## 2018-01-04 NOTE — Progress Notes (Signed)
* * *        [name], Shelia**    --- ---    57 Y old Female, DOB: Jul 10, 1960, External MRN: [id number]    Account Number: [id number]    [address], [place], [id number]    Home: [phone]    Guarantor: [name] Insurance: [id number] NHP PPO    PCP: [name], MD Referring: [name], MD External Visit ID: [id number]    Appointment Facility: Endocrinology        * * *    01/04/2018   **Appointment Provider:** [name], NP **CHN#:** [id number]    --- ---      **Supervising Provider:** Paulla Dolly, MD    ---         **Current Medications**    ---    Taking     * Baclofen 10 MG Tablet 1/2 tab Orally prn    ---    * Calcium Carbonate-Vitamin D 600-200 MG-UNIT Capsule 1 capsule with a meal Orally Twice a day    ---    * GlipiZIDE XL 5 MG Tablet Extended Release 24 Hour 1 tablet Orally Once a day    ---    * Glucophage XR 500 mg Tablet Extended Release 24 Hour 4 tablets Orally Once a day    ---    * Hydroxychloroquine Sulfate 200 mg Tablet 1 tablet with food or milk Orally Once a day    ---    * Ibuprofen 600 MG Tablet 1 tablet with food or milk as needed Orally Three times a day    ---    * Jardiance 10 MG Tablet 1 tablet Orally Once a day    ---    * Levothyroxine Sodium 25 MCG Tablet 1 tablet Orally Once a day    ---    * Omeprazole 40 mg Capsule Delayed Release 1 capsule Orally Once a day    ---    * PredniSONE 10 MG Tablet 2 tablet Orally Once a day    ---    * Ranitidine HCl 150 mg Capsule 1 capsule at bedtime Orally Once a day    ---    * Rosuvastatin Calcium 10 MG Tablet 1 tablet Orally Once a day    ---    * Sulindac 150 mg Tablet 1 tablet with food Orally Twice a day    ---    * Tramadol HCl 50 MG Tablet 1 tablet Orally every 6 hrs    ---    Not-Taking/PRN    * Humira Pen 40 MG/0.8ML Pen-injector Kit 0.8 ml Subcutaneous every 2 weeks    ---    * Voltaren 1 % Gel 2 grams Transdermal twice daily, Notes: PRN    ---    Unknown    * Oxycodone HCl 5 mg Tablet 1 tablet Orally as needed for pain    ---      Past  Medical History    ---       Scleroderma - CREST dx 2007.        ---    IgG4 deficiency s/p IVIG 2010 ----single infusion given preventively after  week of bilateral knee replacements at [facility] 2010.        ---    Fx left wrist in 1994.        ---    Blood clot at LUE in 2006 on a short course of Coumadin.        ---    Bilateral carpal  tunnel syndrome s/p Lt carpal tunnel release and steroids  injection right--.        ---    Bone spur left foot.        ---    Scoliosis.        ---    Spinal stenosis s/p steroids injection.        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 Katrinka Blazing 2010--- never infected, but packed with antibiotics with  surgery;.        ---    Right rotator cuff repairs x 4, complicated by repeat tears, infection,  placement of anchor material.        ---    Elevated liver function tests.        ---    Diabetes.        ---       **Surgical History**    ---       Right rotator cuff repair, with infected hardware that had to be removed  2006    ---    Repeat right shoulder surgery, also which became infected. 2007    ---    Left rotator cuff repair 2008    ---    bilateral knee replacements 2009    ---    Left arthroscopic carpal tunnel release 01/2011    ---    ORIF Left 4th metatarsal bone    ---    Left Ulna shortening 1994    ---    Knee replacement 2010    ---    Hand surgery 2013, 2015    ---    remove gallbladder/hernia 1990    ---    Plate left foot 3rd metatarsal 2008    ---       **Family History**    ---       Mother: deceased 55 yrs, lung cancer, hyperthyroidism, diagnosed with  Cancer    ---    Father: deceased 55s yrs, heart attack, Heart Disease    ---    3 brother(s) .    ---    FH of arthritis and scleroderma\nFather deceased from MI\nMother deceased age  32 with lung cancer \\nBrother  with scleroderma \/ copd.    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    Alcohol    _Former daily EtOH use  in 20s_    Tobacco history: Never smoked.   Nonsmoker.    Lives with longstanding boyfriend.    ---       **Hospitalization/Major Diagnostic Procedure**    ---       as above    ---      **Review of Systems**    ---     _Diabetes_ :    General: no unintended weight change, no fevers, malaise, weakness. Eyes: no  vision loss. Cardiovascular: no CP, no orthopnea, no palpitations.  Respiratory: no wheezing, no dyspnea. Gastrointestinal: no n/v, no abdominal  pain. Musculoskeletal: no edema. Skin: no rash. Neurological: no numbness.            **Reason for Appointment**    ---       1\. FU RESCHEDULE    ---       **History of Present Illness**    ---     _General_ :    57 y/o female with scleroderma (on prednisone), IgG-4 deficiency, NASH and  uncontrolled T2DM. Seen for initial visit on 03/15/16 for management of  hyperglycemia in diabetes. No Endocrine visit since 04/2016. In the interim has  continued to check blood sugars at home, take metformin and now glipizide is  reduced to 2.5mg  as she reports hypoglycemia episodes with the 5mg  dose.    Diabetes Meds:    glipizide 2.5mg     metformin 2000mg  daily    jardiance 10mg  once daily    -----    reports hypoglycemia 40-60's range on a higher dose of glipizide-5mg , 10mg .    A1c Trend:    01/04/18-----7.7%    09/14/17-----10%    03/02/16-----8.7%    08/05/15-----11%    11/04/14-----7.1%    12/31/13-----7%    05/06/13-----6.6%.    Recent Blood Glucoses:    7 day average= 158    14 day average= 159    30 day average= 164    predinner readings:138, 136, 208, 154, 175, 155, 166, 139, 245, 190, 136, 125,  129, 191, 155.    Diet:    3 meals daily, low CHO    working on reducing fat/cho portions--seen by Nutrition 04/2016.    Exercise:    walks 1-2 mi daily with dog.    Microvascular Complications Screening:    eyes: no acute vision changes, no hx of retinopathy, reports annual exam with  ophthalmologist is scheduled    kidneys: creat:0.91, egfr 71 (09/14/17), **uACR:137 (06/2017)**     feet: denies numbess or parethesias in feet, seen by Dr. Vassie Loll- hammering of  the lesser toes, given rx for custom orthotics.    Macrovascular Complications Screening:    no hx of MI, CVA or PVD    (+) hld    Lipids: TChol: 158, TG:110, HDL:67, LDL:69, Chol/HDL ratio: 2.3% (01/2017)    on rosuvastatin.      **Vital Signs**    ---    Pain scale 5, Ht-in 62, Wt-lbs 177, BMI 32.37, BP 122/84, HR 86, BSA 1.87, O2  99, Ht-cm 157.48, Wt-kg 80.29, Wt Change -3 lb.       **Physical Examination**    ---     _Endo: Diabetes Follow-Up_ :    Eyes: no visible retinopathy.    Heart/CV: heart rate is regular.    Extremities: No LE edema, feet without cuts or ulcers.    Neurological: intact vibration and light touch in feet.    General alert and oriented x3, NAD.    counseled for 20 minutes of this 25 minute visit.       **Assessments**    ---    1\. Type 2 diabetes mellitus with hyperglycemia, without long-term current use  of insulin - E11.65 (Primary)    ---    2\. Other hyperlipidemia - E78.4    ---    3\. Obesity (BMI 30-39.9) - E66.9    ---    4\. Encounter for immunization - Z23    ---      58 y/o female with obesity, hypothyroidism, scleroderma, hld and  uncontrolled T2DM. 3 month f/u of diabetes today. Has been compliant and  tolerating the initiation of jardiance at her last visit. POC A1c is 7.7%  today. Review of limited bg readings demonstrate fair glycemic control with  some variability. I have discussed factors that will impact glucose including  diet/food choices, activity levels. She plans to continue to work on lifestyle  measures. I will continue her current medication doses. She has a history of  hypoglycemia on higher doses of glipizide,  so I would also consider stopping  the 2.5mg  dose of glipizide at the next visit if her blood sugars continue to  improve. She currently denies signs or symptoms of low blood glucose.    On high potency statin, LDL today is 110, slightly above goal of <100.    Annual  influenza vaccine administered today.    ---       **Treatment**    ---       **1\. Type 2 diabetes mellitus with hyperglycemia, without long-term  current use of insulin**    Continue Glucophage XR Tablet Extended Release 24 Hour, 500 mg, 4 tablets,  Orally, Once a day    Continue GlipiZIDE XL Tablet Extended Release 24 Hour, 2.5 mg, 1 tablet,  Orally, Once a day    Continue Jardiance Tablet, 10 MG, 1 tablet, Orally, Once a day    _LAB: Vitamin B12 (B12)_   Vitamin B12  2000  H  211 - 911 - pg/mL    --- --- --- ---    _LAB: Lipid Profile (LIPID)_ Cholesterol  206  H  110 - 199 - mg/dL    --- --- --- ---    High Density Lipoprotein Chol (HDL)  69    35 - 75 - mg/dL    --- --- --- ---    LDL  110    0 - 129 - mg/dL    --- --- --- ---    Chol-Hdl Ratio  3.0     \-    --- --- --- ---    Triglycerides  133    40 - 250 - mg/dL    --- --- --- ---    Hours Fast  2     \- hours    --- --- --- ---    _LAB: Albumin, random urine w/ creat (UALB)_ Albumin Random, Urine  1.4     \-  mg/dL    --- --- --- ---    Microalbumin Calc, Urine  0.03     \- mg/mg    --- --- --- ---    Creatinine Random, Urine  50.04     \- mg/dL    --- --- --- ---    Albumin/Creatinine Ratio, Urine  28    0 - 30 - mg/g    --- --- --- ---         **2\. Other hyperlipidemia**    Continue Rosuvastatin Calcium Tablet, 10 MG, 1 tablet, Orally, Once a day         **3\. Others**    Notes: 1) Please remember to do your repeat blood tests at least one week  prior to your next scheduled follow up visit with [facility]. If blood  tests are not done in advance of the appointment or if an appointment is  canceled/missed, you will be notified of test results via mail. 2) Please  check your medications and notify us as soon as possible if you need refills,  with at least one week''s notice. We cannot guarantee that refill requests can  be processed in a timely fashion sooner than this. , 3) Please remember to  bring your blood sugar meter and/or logs to every office  visit., 4) Please  allow at least 24 hours for any required return phone call. I am in clinic or  available by phone Tuesday-Friday. I am out of the clinic on Mondays. If your  issue or concern is urgent you may ask to speak to the  doctor on call if I am  not available.....,I have reviewed the findings, assessment and treatment plan  with [name], NP and have edited Progress Note as required. Sugars are in  good range. May stop glipizide to avoid hypoglycemic risk. Flu shot today.      **Immunization**    ---       Flu (private) 19 yrs + : 0.5 mL (Route: Intramuscular) given by [name]  , NP on Left Deltoid (Encounter for immunization)    ---       **Procedure Codes**    ---       1610 96045 GLYCOSYLATED HEMOGLOBINS    ---    40981 Flu (private) 19 yrs +    ---    8205 FLUE VIRUS VACCINE ADMIN END - 19147    ---    8523 82956 IIV4 FLU VACC PF 0.5ML IM    ---       **Follow Up**    ---    3 Months, labs today    **Appointment Provider:** [name], NP    Electronically signed by Paulla Dolly , MD on 01/05/2018 at 07:17 AM EDT    Sign off status: Completed        * * *        Endocrinology    [address]    [place], [address]    [place], [place] [address]    Tel: [phone]    Fax: [phone]              * * *          Patient: CARLTON, SWEANEY DOB: 05-23-1960 Progress Note: [name], NP  01/04/2018    ---    Note generated by eClinicalWorks EMR/PM Software ([email or web address])

## 2018-01-04 NOTE — Progress Notes (Signed)
.  Progress Notes  .  Patient: [name]  Provider: [name]  DOB: 08-21-1960 Age: 57 Y Sex: Female  Supervising Provider:: Paulla Dolly, MD  Date: 01/04/2018  .  PCP: [name]  MD  Date: 01/04/2018  .  --------------------------------------------------------------------------------  .  REASON FOR APPOINTMENT  .  1. FU RESCHEDULE  .  HISTORY OF PRESENT ILLNESS  .  General:  57 y/o female with scleroderma (on  prednisone), IgG-4 deficiency, NASH and uncontrolled T2DM. Seen  for initial visit on 03/15/16 for management of hyperglycemia in  diabetes. No Endocrine visit since 04/2016. In the interim has  continued to check blood sugars at home, take metformin and now  glipizide is reduced to 2.5mg  as she reports hypoglycemia  episodes with the 5mg  dose.  Diabetes Meds:  .  glipizide 2.5mg   metformin 2000mg  daily  jardiance 10mg  once daily  -----  reports hypoglycemia 40-60's range on a higher dose of  glipizide-5mg , 10mg .  .  A1c Trend:  .  01/04/18-----7.7%  09/14/17-----10%  03/02/16-----8.7%  08/05/15-----11%  11/04/14-----7.1%  12/31/13-----7%  05/06/13-----6.6%.  [name]  Recent Blood Glucoses:  .  7 day average= 158  14 day average= 159  30 day average= 164  predinner readings:138, 136, 208, 154, 175, 155, 166, 139, 245,  190, 136, 125, 129, 191, 155.  .  Diet:  .  3 meals daily, low CHO  working on reducing fat/cho portions--seen by Nutrition 04/2016.  [name]  Exercise:  .  walks 1-2 mi daily with dog.  .  Microvascular Complications Screening:  .  eyes: no acute vision changes, no hx of retinopathy, reports  annual exam with ophthalmologist is scheduled  kidneys: creat:0.91, egfr 71 (09/14/17), uACR:137 (06/2017)  feet: denies numbess or parethesias in feet, seen by Dr. Vassie Loll-  hammering of the lesser toes, given rx for custom orthotics.  .  Macrovascular Complications Screening:  .  no hx of MI, CVA or PVD  (+) hld  Lipids: TChol: 158, TG:110, HDL:67, LDL:69, Chol/HDL ratio: 2.3%  (01/2017)  on  rosuvastatin.  [name]  CURRENT MEDICATIONS  .  Taking Baclofen 10 MG Tablet 1/2 tab Orally prn  Taking Calcium Carbonate-Vitamin D 600-200 MG-UNIT Capsule 1  capsule with a meal Orally Twice a day  Taking GlipiZIDE XL 5 MG Tablet Extended Release 24 Hour 1 tablet  Orally Once a day  Taking Glucophage XR 500 mg Tablet Extended Release 24 Hour 4  tablets Orally Once a day  Taking Hydroxychloroquine Sulfate 200 mg Tablet 1 tablet with  food or milk Orally Once a day  Taking Ibuprofen 600 MG Tablet 1 tablet with food or milk as  needed Orally Three times a day  Taking Jardiance 10 MG Tablet 1 tablet Orally Once a day  Taking Levothyroxine Sodium 25 MCG Tablet 1 tablet Orally Once a  day  Taking Omeprazole 40 mg Capsule Delayed Release 1 capsule Orally  Once a day  Taking PredniSONE 10 MG Tablet 2 tablet Orally Once a day  Taking Ranitidine HCl 150 mg Capsule 1 capsule at bedtime Orally  Once a day  Taking Rosuvastatin Calcium 10 MG Tablet 1 tablet Orally Once a  day  Taking Sulindac 150 mg Tablet 1 tablet with food Orally Twice a  day  Taking Tramadol HCl 50 MG Tablet 1 tablet Orally every 6 hrs  Not-Taking/PRN Humira Pen 40 MG/0.8ML Pen-injector Kit 0.8 ml  Subcutaneous every 2 weeks  Not-Taking/PRN Voltaren 1 % Gel  2 grams Transdermal twice daily,  Notes: PRN  Unknown Oxycodone HCl 5 mg Tablet 1 tablet Orally as needed for  pain  .  PAST MEDICAL HISTORY  .  Scleroderma - CREST dx 2007  IgG4 deficiency s/p IVIG 2010 ----single infusion given  preventively after week of bilateral [facility]  2010  Fx left wrist in 1994  Blood clot at LUE in 2006 on a short course of Coumadin  Bilateral carpal tunnel syndrome s/p Lt carpal tunnel release and  steroids injection right--  Bone spur left foot  Scoliosis  Spinal stenosis s/p steroids injection  s/p bilateral knee replacements for valgus /arthritic  complications, performed by Dr. Katrinka Blazing 2010--- never infected, but  packed with antibiotics with surgery;  Right rotator  cuff repairs x 4, complicated by repeat tears,  infection, placement of anchor material  Elevated liver function tests  Diabetes  .  ALLERGIES  .  yes[Allergies Verified]  .  SURGICAL HISTORY  .  Right rotator cuff repair, with infected hardware that had to be  removed 2006  Repeat right shoulder surgery, also which became infected. 2007  Left rotator cuff repair 2008  bilateral knee replacements 2009  Left arthroscopic carpal tunnel release 01/2011  ORIF Left 4th metatarsal bone  Left Ulna shortening 1994  Knee replacement 2010  Hand surgery 2013, 2015  remove gallbladder/hernia 1990  Plate left foot 3rd metatarsal 2008  .  FAMILY HISTORY  .  Mother: deceased 48 yrs, lung cancer, hyperthyroidism, diagnosed  with Cancer  Father: deceased 57s yrs, heart attack, Heart Disease  3 brother(s) .  FH of arthritis and scleroderma\nFather deceased from MI\nMother  deceased age 6 with lung cancer \\nBrother  with scleroderma \/  copd.  .  SOCIAL HISTORY  .  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  .  .  Alcohol  Former daily EtOH use in 20s  .  [name]  Tobacco  history: Never smoked.  .  Nonsmoker.Lives with longstanding boyfriend.  [name]  HOSPITALIZATION/MAJOR DIAGNOSTIC PROCEDURE  .  as above  .  REVIEW OF SYSTEMS  .  Diabetes:  .  General:    no unintended weight change, no fevers, malaise,  weakness . Eyes:    no vision loss . Cardiovascular:    no CP, no  orthopnea, no palpitations . Respiratory:    no wheezing, no  dyspnea . Gastrointestinal:    no n/v, no abdominal pain .  Musculoskeletal:    no edema . Skin:    no rash . Neurological:     no numbness .  [name]  VITAL SIGNS  .  Pain scale 5, Ht-in 62, Wt-lbs 177, BMI 32.37, BP 122/84, HR 86,  BSA 1.87, O2 99, Ht-cm 157.48, Wt-kg 80.29, Wt Change -3 lb.  .  PHYSICAL EXAMINATION  .  Endo: Diabetes Follow-Up:  Eyes:  no visible retinopathy.  Heart/CV:  heart rate is regular.  Extremities:  No LE edema, feet without cuts or ulcers.  Neurological:  intact vibration and light  touch in feet.  General  alert and oriented x3, NAD.  counseled for 20 minutes of this 25 minute visit.  .  ASSESSMENTS  .  Type 2 diabetes mellitus with hyperglycemia, without long-term  current use of insulin - E11.65 (Primary)  .  Other hyperlipidemia - E78.4  .  Obesity (BMI 30-39.9) - E66.9  .  Encounter for immunization - Z24  .  57 y/o female with obesity, hypothyroidism, scleroderma, hld and  uncontrolled T2DM. 3 month f/u of diabetes today. Has been  compliant and tolerating the initiation of jardiance at her last  visit. POC A1c is 7.7% today. Review of limited bg readings  demonstrate fair glycemic control with some variability. I have  discussed factors that will impact glucose including diet/food  choices, activity levels. She plans to continue to work on  lifestyle measures. I will continue her current medication doses.  She has a history of hypoglycemia on higher doses of glipizide,  so I would also consider stopping the 2.5mg  dose of glipizide at  the next visit if her blood sugars continue to improve. She  currently denies signs or symptoms of low blood glucose.On high  potency statin, LDL today is 110, slightly above goal of  <100.Annual influenza vaccine administered today.  .  TREATMENT  .  Type 2 diabetes mellitus with hyperglycemia,  without long-term current use of insulin  Continue Glucophage XR Tablet Extended Release 24 Hour, 500 mg, 4  tablets, Orally, Once a day  Continue GlipiZIDE XL Tablet Extended Release 24 Hour, 2.5 mg, 1  tablet, Orally, Once a day  Continue Jardiance Tablet, 10 MG, 1 tablet, Orally, Once a day  LAB: Vitamin B12 (B12)  Vitamin B12     2000     (211 - 911 - pg/mL)  .  [name]  LAB: Lipid Profile (LIPID)  Cholesterol     206     (110 - 199 - mg/dL)  High Density Lipoprotein Chol (HDL)     69     (35 - 75 - mg/dL)  LDL     161     (0 - 129 - mg/dL)  Chol-Hdl Ratio     3.0     ( - )  Triglycerides     133     (40 - 250 - mg/dL)  Hours Fast     2     ( - hours)  .  [name]  LAB:  Albumin, random urine w/ creat (UALB)  Albumin Random, Urine     1.4     ( - mg/dL)  Microalbumin Calc, Urine     0.03     ( - mg/mg)  Creatinine Random, Urine     50.04     ( - mg/dL)  Albumin/Creatinine Ratio, Urine     28     (0 - 30 - mg/g)  .  [name]  Other hyperlipidemia  Continue Rosuvastatin Calcium Tablet, 10 MG, 1 tablet, Orally,  Once a day  .  [name]  Others  Notes: 1) Please remember to do your repeat blood tests at least  one week prior to your next scheduled follow up visit with [facility]. If blood tests are not done in advance of the  appointment or if an appointment is canceled/missed, you will be  notified of test results via mail. 2) Please check your  medications and notify us as soon as possible if you need  refills, with at least one week''s notice. We cannot guarantee  that refill requests can be processed in a timely fashion sooner  than this. , 3) Please remember to bring your blood sugar meter  and/or logs to every office visit., 4) Please allow at least 24  hours for any required return phone call. I am in clinic or  available by phone Tuesday-Friday. I am out of the clinic on  Mondays. If your issue or concern is urgent you may ask to  speak  to the doctor on call if I am not available.....,I have reviewed  the findings, assessment and treatment plan with [name], NP  and have edited Progress Note as required. Sugars are in good  range. May stop glipizide to avoid hypoglycemic risk. Flu shot  today.  [name]  PROCEDURE CODES  .  9629 [id number] GLYCOSYLATED HEMOGLOBINS  .  52841 Flu (private) 19 yrs +  .  8205 FLUE VIRUS VACCINE ADMIN END - 32440  .  8523 10272 IIV4 FLU VACC PF 0.5ML IM  .  FOLLOW UP  .  3 Months, labs today  .  [name]  Appointment Provider: [name], NP  .  Electronically signed by Paulla Dolly , MD on  01/05/2018 at 07:17 AM EDT  .  CONFIRMATORY SIGN OFF  .  [name]  Document electronically signed by [name]    .

## 2018-01-04 NOTE — Progress Notes (Signed)
* * *        [name]**    --- ---    60 Y old Female, DOB: [birth year]    [address], [place], [place] [address]    Home: [phone]    Provider: [name]        * * *    Telephone Encounter    ---    Answered by   Charlies Constable  Date: 01/04/2018         Time: 05:04 PM    Reason   *Orencia Medication Access_ Pharmacy    --- ---            Message                      RX PA denied for Orencia per [facility], Ph [phone], plan type: commercial. Denial letter is scanned under patient docs in Misc.                                                                                                                                                       Action Taken                      Solomon,[name]  01/04/2018 5:06:29 PM >       [name],[name] , [name] 01/07/2018 1:14:50 PM > Per denial letter, [facility] denied because they requires trial and failure of all the preferred agents: Humira, Enbrel, Kyung Rudd. Pt has tried and failed Humia and Enbrel, has an hx of developing shingles in [place] and also has elevated AST/ALT. Will try to appeal.       [name],[name] , [name] 01/07/2018 5:23:49 PM > Appeal faxed      [name],[name] , [name] 01/08/2018 9:53:25 AM > Hi [name], I submitted this appeal yesterday. Can you check in on Friday to see the status and make sure they received it. Thanks!       Zipeto,Anthony V 01/11/2018 3:31:42 PM > Spoke to [place] at appeals and she said nothing has been recieved yet. If you want to re-fax it I'll call on Tuesday to follow up.      [name],[name] , [name] 01/11/2018 4:30:53 PM > Refaxed it! Thanks for checking!      [name],[name] , [name] 01/16/2018 1:44:54 PM > Appeal denied (same reasoning). Will discuss with [name] on Friday.      [name],[name] , [name] 01/18/2018 10:36:27 AM > Discussed with [name], will start a peer to peer - Monday 9-10, Wednesday 12-1, or 3-4:30, Th: 8-10am, Fri after 1:30-4:00.       [name],[name] , [name] 01/21/2018 3:08:28 PM > Hi [name], I can't  find an appeal number to call on the denial letter. Can you see if there is a phone number I can call to schedule?  Thanks!        Zipeto,Anthony V 01/21/2018 3:18:58 PM > Hi Try [phone]      [name],[name] , [name] 01/21/2018 4:16:31 PM > Spoke to [facility]) at [facility] and provided Dr. Blair Heys availability/contact.       [name],[name] , [name] 01/23/2018 3:17:41 PM > Per [name], peer to peer approved "Conducted [facility] review [facility] about Orencia.  I provided most recent LFTs and description of zoster event.  They agreed IL-6 and JAK was inappropriate and agreed to cover Orencia."      [name] - Can you see if the medication is restricted to any pharmacy if they can get [place] an approval letter? Thanks!       Zipeto,Anthony V 01/23/2018 3:36:00 PM > spoke to [facility] PA authorization # [id number] PA effective 01/23/18-01/31/2038. [facility] She was unable to find approval letter to fax over.      [name],[name] , [name] 01/23/2018 4:11:40 PM > Thanks! I will [facility] the [facility]. LVM for the patient.      [name],[name] , [name] 01/24/2018 9:50:37 AM > Patient notified. We reviewed storage, disposal and dosing frequency again. patient verbalized understanding and states she feels comfortable doing the first injection on her own. Will check in on her in a month for follow-up.                 Refills  Start Orencia ClickJect Solution Auto-injector, 125 MG/ML,  Subcutaneous, 4, 1 ml, once weekly, 28 days, Refills=5    --- ---          * * *                ---          * * *          PatientDenny [name], [name] DOB: [birth year] Provider: [name] 01/04/2018    ---    Note generated by eClinicalWorks EMR/PM Software ([email or web address])

## 2018-01-09 LAB — HX IMMUNOLOGY
HX QUANTIFERON-MITOGEN-NIL: 6.05 [IU]/mL
HX QUANTIFERON-NIL: 0.02 [IU]/mL
HX QUANTIFERON-TB PLUS, 4T: NEGATIVE
HX QUANTIFERON-TB1-NIL: 0 [IU]/mL
HX QUANTIFERON-TB2-NIL: 0 [IU]/mL

## 2018-01-23 ENCOUNTER — Ambulatory Visit: Admitting: Rheumatology

## 2018-01-23 MED ORDER — Orencia ClickJect: 125 | 4 | 5 refills | 0 days | Status: AC

## 2018-01-23 NOTE — Progress Notes (Signed)
* * *        **  [name]**    --- ---    43 Y old Female, DOB: January 30, 1961    [address] [place], [place] [address]    Home: [phone]    Provider: [name]        * * *    Telephone Encounter    ---    Answered by   [name]  Date: 01/23/2018         Time: 02:54 PM    Action Taken                      Christyne Mccain F 01/23/2018 2:55:04 PM > Conducted peer review for [facility] about Orencia.  I provided most recent LFTs and description of zoster event.  They agreed IL-6 and JAK was inappropriate and agreed to cover Orencia.        --- ---                * * *                ---          * * *          Patient: [name], [name] DOB: [birth year] Provider: [name] 01/23/2018    ---    Note generated by eClinicalWorks EMR/PM Software ([email or web address])

## 2018-01-24 ENCOUNTER — Ambulatory Visit

## 2018-01-24 MED ORDER — METFORMIN HCL: 360 | 0 refills | 0 days | Status: AC

## 2018-01-24 MED ORDER — METFORMIN HCL: 4 | 360 | 1 refills | 0 days | Status: AC

## 2018-01-24 MED ORDER — HYDROXYCHLOROQUINE SULFATE: 1 | 90 | 0 refills | 0 days | Status: AC

## 2018-01-29 ENCOUNTER — Ambulatory Visit

## 2018-01-29 NOTE — Telephone Encounter (Signed)
Call Details:   Patient PCP = [name], MD  [name] (Patient) called on January 29, 2018 11:49 AM.  Message taken by: Bartholomew Crews  Primary call-back number: [phone]    Secondary call-back number: [phone] -    Call Reason(s): Message/Call-Back      ** MESSAGE / CALL-BACK.  Regarding: Pt stated that she faxed FMLA paperwork yesterday. Please contact the pt to confirm if it has been received    ---------- ---------- ---------- ---------- ---------- ----------       RESPONSE/ORDERS:    yes, in Dr. 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)  DIABETES MELLITUS, TYPE II (ICD-250.00) (ICD10-E11.9)  LONG-TERM (CURRENT) USE OF STEROIDS (ICD-V58.65) (ICD10-Z79.51)  SKIN SAGGING DUE TO WEIGHT LOSS (ICD-757.9) (ICD10-Q84.9)  TRANSAMINASES, SERUM, ELEVATED (ICD-790.4) (ICD10-R74.0)  OBESITY (ICD-278.00) (ICD10-E66.9)      DIABETES MELLITUS, TYPE II, UNCONTROLLED (ICD-250.02) (ICD10-E11.65)      FATTY LIVER DISEASE (ICD-571.8) (ICD10-K76.0)  SCLERODERMA, 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)  HYPOTHYROIDISM (ICD-244.9) (ICD10-E03.9)  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) (ICD10-D80.8)  SPINAL STENOSIS, 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)  HEPATITIS C EXPOSURE (HCV RNA NEGATIVE, 01/2014) (ICD-V02.62) (ICD10-Z20.5)  PAIN IN JOINT, HAND (ICD-719.44) (ICD10-M79.643)  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)  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)  CARPAL TUNNEL (ICD-354.0) (ICD10-G56.00)  OSTEOARTHRITIS (ICD-715.09) (ICD10-M15.9)  S/P ROTATOR CUFF SURGERY 12/2004 - RT SHOULDER; 2008 LT (ICD-V45.89)  Family Hx of MELANOMA, FAMILY HX (ICD-V16.8) (ICD10-Z80.8)  FRACTURE OF OTHER SPEC  SITE,  PATHOLOGIC - MULTIPLE (ICD-733.19)  CHOLECYSTECTOMY AND HERNIA REPAIR (ICD-V45.89)  VITAMIN D DEFICIENCY (ICD-268.9) (ICD10-E55.9)  COLONOSCOPY, NEXT 2020- SEE COMMENT (ICD-V76.51) (ICD10-Z01.89)    Meds (prior to this call):   ROSUVASTATIN CALCIUM 10 MG ORAL TABLET (ROSUVASTATIN CALCIUM) take one tablet by mouth daily; Route: ORAL  LISINOPRIL 2.5 MG ORAL TABLET (LISINOPRIL) Take one tablet by mouth once daily; Route: ORAL  METFORMIN HCL ER 500 MG XR24H-TAB (METFORMIN HCL) TAKE 4 TABLETS BY MOUTH EVERY MORNING  GLIPIZIDE XL 2.5 MG ORAL TABLET EXTENDED RELEASE 24 HOUR (GLIPIZIDE) Take one tablet by mouth once daily; Route: ORAL  PREDNISONE 10 MG ORAL TABLET (PREDNISONE) Take 1 tablet by mouth once daily; Route: ORAL  LEVOTHYROXINE SODIUM 25 MCG ORAL TABLET (LEVOTHYROXINE SODIUM) Take one tablet by mouth once daily  OMEPRAZOLE 20 MG ORAL CAPSULE DELAYED RELEASE (OMEPRAZOLE) Take one capsule by mouth twice a day; Route: ORAL  IBUPROFEN 600 MG ORAL TABLET (IBUPROFEN) 1 tab by mouth TID as needed for pain  BACLOFEN 10 MG ORAL TABLET (BACLOFEN) Please take half tablet once a day as needed for back spasms  DICLOFENAC SODIUM 1 % TRANSDERMAL GEL (DICLOFENAC SODIUM) APP 2 GRAMS EXT AA BID      FREESTYLE FREEDOM LITE w/Device KIT (BLOOD GLUCOSE MONITORING SUPPL) use as directed (ICD 10 E11.65)      FREESTYLE LITE BLOOD GLUCOSE STRIPS (GLUCOSE BLOOD) USE TO CHECK BLOOD GLUCOSE THREE TIMES DAILY      FREESTYLE LANCETS (LANCETS) check fingerstick three times a day (ICD 10 E11.65)  TRAMADOL HCL 50 MG ORAL TABLET (TRAMADOL HCL) take one tablet daily as needed for pain; Route: ORAL  OXYCODONE HCL 5 MG ORAL TABLET (OXYCODONE HCL) Partial fill upon patient request.  Take one tablet daily as needed for pain >8; Route: ORAL  CALCIUM CARBONATE-VITAMIN D 600-200 MG-UNIT ORAL TABLET (CALCIUM CARBONATE-VITAMIN D) take one tablet twice a day; Route: ORAL  GABAPENTIN 300 MG ORAL CAPSULE (GABAPENTIN) one capsule by mouth at bedtime as  needed for pain; Route: ORAL  HYDROXYCHLOROQUINE SULFATE 200 MG ORAL TABLET (HYDROXYCHLOROQUINE SULFATE) one tablet oral daily; Route: ORAL            Created By Bartholomew Crews on 01/29/2018 at 11:49 AM    Electronically Signed By [name], MD on 01/29/2018 at 01:07 PM

## 2018-01-29 NOTE — Telephone Encounter (Signed)
RESPONSE/ORDERS:  can you let her know to bring old/last year's FMLA paperwork on friday to the appt or fax it? Please let her know the paperwork completion/turnaround time is 1-2 weeks. 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)  DIABETES MELLITUS, TYPE II (ICD-250.00) (ICD10-E11.9)  LONG-TERM (CURRENT) USE OF STEROIDS (ICD-V58.65) (ICD10-Z79.51)  SKIN SAGGING DUE TO WEIGHT LOSS (ICD-757.9) (ICD10-Q84.9)  TRANSAMINASES, SERUM, ELEVATED (ICD-790.4) (ICD10-R74.0)  OBESITY (ICD-278.00) (ICD10-E66.9)      DIABETES MELLITUS, TYPE II, UNCONTROLLED (ICD-250.02) (ICD10-E11.65)      FATTY LIVER DISEASE (ICD-571.8) (ICD10-K76.0)  SCLERODERMA, 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)  HYPOTHYROIDISM (ICD-244.9) (ICD10-E03.9)  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) (ICD10-D80.8)  SPINAL STENOSIS, 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)  HEPATITIS C EXPOSURE (HCV RNA NEGATIVE, 01/2014) (ICD-V02.62) (ICD10-Z20.5)  PAIN IN JOINT, HAND (ICD-719.44) (ICD10-M79.643)  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)  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)  CARPAL TUNNEL (ICD-354.0) (ICD10-G56.00)  OSTEOARTHRITIS (ICD-715.09) (ICD10-M15.9)  S/P ROTATOR CUFF SURGERY 12/2004 - RT SHOULDER; 2008 LT (ICD-V45.89)  Family Hx of MELANOMA, FAMILY HX (ICD-V16.8) (ICD10-Z80.8)  FRACTURE OF OTHER SPEC SITE,  PATHOLOGIC - MULTIPLE (ICD-733.19)  CHOLECYSTECTOMY AND HERNIA REPAIR (ICD-V45.89)  VITAMIN D DEFICIENCY (ICD-268.9) (ICD10-E55.9)  COLONOSCOPY, NEXT 2020- SEE COMMENT (ICD-V76.51) (ICD10-Z01.89)    Meds (prior to this call):    ROSUVASTATIN CALCIUM 10 MG ORAL TABLET (ROSUVASTATIN CALCIUM) take one tablet by mouth daily; Route: ORAL  LISINOPRIL 2.5 MG ORAL TABLET (LISINOPRIL) Take one tablet by mouth once daily; Route: ORAL  METFORMIN HCL ER 500 MG XR24H-TAB (METFORMIN HCL) TAKE 4 TABLETS BY MOUTH EVERY MORNING  GLIPIZIDE XL 2.5 MG ORAL TABLET EXTENDED RELEASE 24 HOUR (GLIPIZIDE) Take one tablet by mouth once daily; Route: ORAL  PREDNISONE 10 MG ORAL TABLET (PREDNISONE) Take 1 tablet by mouth once daily; Route: ORAL  LEVOTHYROXINE SODIUM 25 MCG ORAL TABLET (LEVOTHYROXINE SODIUM) Take one tablet by mouth once daily  OMEPRAZOLE 20 MG ORAL CAPSULE DELAYED RELEASE (OMEPRAZOLE) Take one capsule by mouth twice a day; Route: ORAL  IBUPROFEN 600 MG ORAL TABLET (IBUPROFEN) 1 tab by mouth TID as needed for pain  BACLOFEN 10 MG ORAL TABLET (BACLOFEN) Please take half tablet once a day as needed for back spasms  DICLOFENAC SODIUM 1 % TRANSDERMAL GEL (DICLOFENAC SODIUM) APP 2 GRAMS EXT AA BID      FREESTYLE FREEDOM LITE w/Device KIT (BLOOD GLUCOSE MONITORING SUPPL) use as directed (ICD 10 E11.65)      FREESTYLE LITE BLOOD GLUCOSE STRIPS (GLUCOSE BLOOD) USE TO CHECK BLOOD GLUCOSE THREE TIMES DAILY      FREESTYLE LANCETS (LANCETS) check fingerstick three times a day (ICD 10 E11.65)  TRAMADOL HCL 50 MG ORAL TABLET (TRAMADOL HCL) take one tablet daily as needed for pain; Route: ORAL  OXYCODONE HCL 5 MG ORAL TABLET (OXYCODONE HCL) Partial fill upon patient request.  Take one tablet daily as needed for pain >8; Route: ORAL  CALCIUM CARBONATE-VITAMIN D 600-200 MG-UNIT ORAL TABLET (CALCIUM CARBONATE-VITAMIN D) take one tablet twice a day; Route: ORAL  GABAPENTIN 300 MG ORAL CAPSULE (GABAPENTIN) one capsule by mouth at bedtime as needed for pain; Route: ORAL  HYDROXYCHLOROQUINE SULFATE 200  MG ORAL TABLET (HYDROXYCHLOROQUINE SULFATE) one tablet oral daily; Route: ORAL            Created By [name], MD on 01/29/2018 at 02:54 PM    Electronically Signed By  [name], MD on 01/31/2018 at 09:37 PM

## 2018-02-01 ENCOUNTER — Ambulatory Visit

## 2018-02-01 ENCOUNTER — Ambulatory Visit: Admitting: Internal Medicine

## 2018-02-01 ENCOUNTER — Ambulatory Visit: Admit: 2018-02-01 | Payer: No Typology Code available for payment source

## 2018-02-01 LAB — HX CHEM-METABOLIC: HX THYROID STIMULATING HORMONE (TSH): 1.44 u[IU]/mL (ref 0.35–4.94)

## 2018-02-01 MED ORDER — SULFAMETHOXAZOLE-TRIMETHOPRIM: 1 | 14 | 0 refills | 0 days | Status: DC

## 2018-02-01 MED ORDER — TRAMADOL HCL: 1 | 30 | 0 refills | 0 days | Status: AC

## 2018-02-01 MED ORDER — GABAPENTIN: 1 | 30 | 0 refills | 0 days | Status: AC

## 2018-02-01 MED ORDER — BACLOFEN: 0.5 | 20 | 0 refills | 0 days | Status: AC

## 2018-02-01 MED ORDER — OXYCODONE HCL: 1 | 28 | 0 refills | 0 days | Status: AC
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
## 2018-02-01 NOTE — Progress Notes (Signed)
[facility]  [address]  [place] [address]  Main: [phone]  Fax: [phone]  Patient Portal: [email or web address]        February 01, 2018            MRN: [id number]            DOB: February 17, 1961   [name]   [address]   [id number]      I am writing to let you know that the following appointments have been made for you.       Primary Care  [place]  Your appointment is scheduled for 04/12/2018 at 9:40AM  You are scheduled to see Dr. Marlane Hatcher  If you need to make any changes, please call [phone]    Bone Density  F[place] 4  Your appointment is scheduled for 04/12/2018 at 10:50AM  You are scheduled to see don't take any multivitains or calcium the day of the test  If you need to make any changes, please call [phone]    Mammogram  [address] 7  Your appointment is scheduled for 05/02/2018 at 9:0AM  If you need to make any changes, please call [phone]            Sincerely,         Everlene Farrier  [facility]  [phone]  ]      Created By Everlene Farrier on 02/01/2018 at 09:05 AM    Electronically Signed By Everlene Farrier on 02/01/2018 at 09:08 AM

## 2018-02-04 ENCOUNTER — Ambulatory Visit

## 2018-02-04 NOTE — Progress Notes (Signed)
[facility]  [address]  [place] [address]  Main: [phone]  Fax: [phone]  Patient Portal: [email or web address]      February 05, 2018            MRN: [id number]            DOB: [birth year]   [name]   [address]   [place] [address]      The results of your recent tests are as follows:      Endocrine Tests:      TSH:     1.44  Normal 0.35-4.94   02/01/2018     Vitamin D = 38 (normal)    Spine cervical x-ray shows:  Cervical spondylosis with bilateral osteophytic impingement upon the neural foramina.  This means that you have degenerative changes in the neck (like arthritis), and it is causing narrowing of the canal/foramina, similar to how you have spinal stenosis in the back.       Sincerely,       [name], MD  [facility]  [phone]  Results Provided by: Mail           Created By Everlene Farrier on 02/04/2018 at 01:33 PM    Electronically Signed By [name], MD on 02/05/2018 at 09:59 AM

## 2018-02-05 ENCOUNTER — Ambulatory Visit

## 2018-02-05 LAB — HX CHEM-VITAMIN
HX VITAMIN D 25OH-D2: <2 ng/mL
HX VITAMIN D 25OH-D3: 38 ng/mL
HX VITAMIN D, 25 HYDROXY: 38 ng/mL (ref 20–100)

## 2018-02-05 LAB — HX OSTEOPOROSIS
HX VITAMIN D 25OH-D2: <2 ng/mL
HX VITAMIN D 25OH-D3: 38 ng/mL

## 2018-02-05 NOTE — Telephone Encounter (Signed)
Call Details:   Patient PCP = [name], MD  [name] (Patient) called on February 05, 2018 8:07 AM.  Message taken by: Jolayne Haines  Primary call-back number: [phone]    Secondary call-back number: () -    Call Reason(s): Message/Call-Back      ** MESSAGE / CALL-BACK.  Regarding: Pt stated that her Oxycodone needs a PA.    Pt wanted to let pcp know she has not heard back from employer. Pt would like pcp to complete a FMLA request form for her. Pt wants to know if Clayborn Heron has the copy from the form from 6months ago.    ---------- ---------- ---------- ---------- ---------- ----------       RESPONSE/ORDERS:    I notified pt  she is unable to find the old one  she sent the blank form last week, please fill out this form for her    pt also needs PA for oxycodone......................................Marland KitchenEwa Chomicki  February 05, 2018 11:24 AM    last filled at [facility] 11/02    gave FMLA form to [employer] to fax. Asked her to retain a copy. .......................................[name], MD  February 08, 2018 4:06 PM    RAN TEST CLAIM. NO PA NEEDED FOR OXYCODONE. .......................................Berdie Ogren  February 11, 2018 10:57 AM    I have the copy of FMLA and will scan it to [employer] this PM  ......................................Marland KitchenEwa Chomicki  February 13, 2018 8:17 AM    thanks .......................................[name], MD  February 18, 2018 10:16 AM             ORDERS/PROBS/MEDS/ALL     Problems:   NECK PAIN (ICD-723.1) (ICD10-M54.2)  CELLULITIS, HAND, RIGHT (ICD-682.4) (ICD10-L03.113)  ANNUAL EXAM (ICD-V72.31) (ICD10-Z00.00)      MAMMOGRAM YEARLY SCREENING (ICD-V76.12) (ICD10-Z12.31)      SCREENING FOR OSTEOPOROSIS (ICD-V82.81) (ICD10-Z13.820)  STEROID USE, LONG TERM (ICD-V58.65) (ICD10-Z79.52)  PREMATURE VENTRICULAR CONTRACTIONS (ICD-427.69) (ICD10-I49.3)  HYPERLIPIDEMIA (ICD-272.4) (ICD10-E78.5)  BACK PAIN, LUMBAR, CHRONIC (ICD-724.2) (ICD10-M54.5)  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) (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)  DIABETES MELLITUS, TYPE II (ICD-250.00) (ICD10-E11.9)  LONG-TERM (CURRENT) USE OF STEROIDS (ICD-V58.65) (ICD10-Z79.51)  SKIN SAGGING DUE TO WEIGHT LOSS (ICD-757.9) (ICD10-Q84.9)  TRANSAMINASES, SERUM, ELEVATED (ICD-790.4) (ICD10-R74.0)  OBESITY, BMI 30-34.9, ADULT (ICD-278.00) (ICD10-E66.9)      DIABETES MELLITUS, TYPE II, UNCONTROLLED (ICD-250.02) (ICD10-E11.65)      FATTY LIVER DISEASE (ICD-571.8) (ICD10-K76.0)  SCLERODERMA, 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)  HYPOTHYROIDISM (ICD-244.9) (ICD10-E03.9)  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) (ICD10-D80.8)  SPINAL STENOSIS, 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)  HEPATITIS C EXPOSURE (HCV RNA NEGATIVE, 01/2014) (ICD-V02.62) (ICD10-Z20.5)  PAIN IN JOINT, HAND (ICD-719.44) (ICD10-M79.643)  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)  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)  CARPAL TUNNEL (ICD-354.0) (ICD10-G56.00)  OSTEOARTHRITIS (ICD-715.09) (ICD10-M15.9)  S/P ROTATOR CUFF SURGERY 12/2004 - RT SHOULDER; 2008 LT (ICD-V45.89)  Family Hx of MELANOMA, FAMILY HX (ICD-V16.8) (ICD10-Z80.8)  FRACTURE OF OTHER SPEC SITE,  PATHOLOGIC - MULTIPLE (ICD-733.19)  CHOLECYSTECTOMY AND HERNIA REPAIR (ICD-V45.89)  VITAMIN D DEFICIENCY (ICD-268.9) (ICD10-E55.9)  COLONOSCOPY, NEXT 2020- SEE COMMENT (ICD-V76.51) (ICD10-Z01.89)    Meds (prior to this call):   HYDROXYCHLOROQUINE SULFATE 200 MG ORAL TABLET (HYDROXYCHLOROQUINE SULFATE) one tablet oral daily; Route: ORAL  ORENCIA 125 MG/ML SUBCUTANEOUS SOLUTION PREFILLED SYRINGE (ABATACEPT) inject once a week; Route: SUBCUTANEOUS  JARDIANCE 10 MG ORAL TABLET (EMPAGLIFLOZIN) Take 1 tablet by mouth once daily; Route: ORAL  ROSUVASTATIN CALCIUM 10 MG ORAL TABLET (ROSUVASTATIN CALCIUM) take one tablet by mouth daily; Route: ORAL  LISINOPRIL 2.5 MG ORAL TABLET (LISINOPRIL) Take one tablet by mouth once daily; Route: ORAL  METFORMIN HCL ER 500 MG XR24H-TAB (METFORMIN HCL) TAKE 4 TABLETS BY MOUTH EVERY  MORNING  GLIPIZIDE XL 2.5 MG ORAL TABLET EXTENDED RELEASE 24 HOUR (GLIPIZIDE) Take one tablet by mouth once daily; Route: ORAL  PREDNISONE 10 MG ORAL TABLET (PREDNISONE) Take 1 tablet by mouth once daily; Route: ORAL  OMEPRAZOLE 20 MG ORAL CAPSULE DELAYED RELEASE (OMEPRAZOLE) Take one capsule by mouth twice a day; Route: ORAL  IBUPROFEN 600 MG ORAL TABLET (IBUPROFEN) 1 tab by mouth TID as needed for pain  BACLOFEN 10 MG ORAL TABLET (BACLOFEN) Please take half tablet once a day as needed for back spasms  DICLOFENAC SODIUM 1 % TRANSDERMAL GEL (DICLOFENAC SODIUM) APP 2 GRAMS EXT AA BID      FREESTYLE FREEDOM LITE w/Device KIT (BLOOD GLUCOSE MONITORING SUPPL) use as directed (ICD 10 E11.65)      FREESTYLE LITE BLOOD GLUCOSE STRIPS (GLUCOSE BLOOD) USE TO CHECK BLOOD GLUCOSE THREE TIMES DAILY      FREESTYLE LANCETS (LANCETS) check fingerstick three times a day (ICD 10 E11.65)  TRAMADOL HCL 50 MG ORAL TABLET (TRAMADOL HCL) take one tablet daily as needed for pain; Route: ORAL  OXYCODONE HCL 5 MG ORAL TABLET (OXYCODONE HCL) Partial fill upon patient request.  Take one tablet daily as needed for pain >8; Route: ORAL  CALCIUM CARBONATE-VITAMIN D 600-200 MG-UNIT ORAL TABLET (CALCIUM CARBONATE-VITAMIN D) take one tablet twice a day; Route: ORAL  GABAPENTIN 300 MG ORAL CAPSULE (GABAPENTIN) one capsule by mouth at bedtime as needed for pain; Route: ORAL  BACTRIM DS 800-160 MG ORAL TABLET (SULFAMETHOXAZOLE-TRIMETHOPRIM) Take one tablet by mouth twice daily; Route: ORAL            Created By Jolayne Haines on 02/05/2018 at 08:07 AM    Electronically Signed By [name], MD on 02/18/2018 at 10:16 AM

## 2018-02-06 ENCOUNTER — Ambulatory Visit

## 2018-03-04 ENCOUNTER — Ambulatory Visit

## 2018-03-07 ENCOUNTER — Ambulatory Visit

## 2018-03-07 NOTE — Progress Notes (Signed)
* * *      [name]**    ------    43 Y old Female, DOB: 1960/06/05    [address] [place], [place] [address]    Home: [phone]    Provider: Peter Congo, [name]        * * *    Telephone Encounter    ---    Answered by  Peter Congo Date: 03/07/2018       Time: 10:56 AM    Reason  *Orencia Clinical Reassessment _ Pharmacy    ------            Action Taken                     [name],[name] , [name] 03/07/2018 12:15:26 PM > LVM for the patient      [name],[name] , [name] 03/08/2018 9:44:50 AM > second attempt, lvm      [name],[name] , [name] 03/08/2018 4:07:32 PM > Pt called back stating infusions are going well.      [name],[name] , [name] 03/08/2018 4:11:02 PM > See virtual visit                     * * *              * * *        ---      Reason for Appointment    ---     1\. *Orencia Clinical Reassessment _ Pharmacy    ---     History of Present Illness    ---     _GENERAL_ :    I followed up with patient today via telephone 1 month after initiating  Orencia (pt states she completed 4-5 injections). Current medications, drug  allergies, and intolerances reviewed with patient and updated/verified in eCW.  Medication reconciliation completed. Relevant lab work reviewed as  appropriate.     Current Medications    ---    Taking    * Baclofen 10 MG Tablet 1/2 tab Orally prn    ---    * GlipiZIDE XL 2.5 mg Tablet Extended Release 24 Hour 1 tablet Orally Once a day    ---    * Glucophage XR 500 mg Tablet Extended Release 24 Hour 4 tablets Orally Once a day    ---    * Hydroxychloroquine Sulfate 200 mg Tablet 1 tablet with food or milk Orally Once a day    ---    * Jardiance 10 MG Tablet 1 tablet Orally Once a day    ---    * Magnesium Cl-Calcium Carbonate 71.5-119 MG Tablet Delayed Release 1 Orally Once a day, Notes: pt unsure strength, says also has vitamin d in it    ---    * Omeprazole 40 mg Capsule Delayed Release 1 capsule Orally Once a day    ---    * Orencia ClickJect 125 MG/ML Solution  Auto-injector 1 ml Subcutaneous once weekly    ---    * PredniSONE 10 MG Tablet 2 tablet Orally Once a day, Notes: Currently taking 10mg /day    ---    * Rosuvastatin Calcium 10 MG Tablet 1 tablet Orally Once a day    ---    * Tramadol HCl 50 MG Tablet 1 tablet Orally every 6 hrs, Notes: PRN    ---    * Voltaren 1 % Gel 2 grams Transdermal twice daily, Notes: PRN    ---  Not-Taking/PRN    * Ranitidine HCl 150 mg Capsule 1 capsule at bedtime Orally Once a day, Notes: PRN - not taking because of recall    ---    Discontinued    * Calcium Carbonate-Vitamin D 600-200 MG-UNIT Capsule 1 capsule with a meal Orally Twice a day    ---    * Levothyroxine Sodium 25 MCG Tablet 1 tablet Orally Once a day    ---    * Vitamin D 1000 UNIT Tablet 1 tablet Orally Once a day, Notes: pt unsure strength    ---    * Medication List reviewed and reconciled with the patient    ---       **Medical History:**  Scleroderma - CREST dx 2007.    ---    IgG4 deficiency s/p IVIG 2010 ----single infusion given preventively after  week of bilateral knee replacements at [facility] 2010.    ---    Fx left wrist in 1994.    ---    Blood clot at LUE in 2006 on a short course of Coumadin.    ---    Bilateral carpal tunnel syndrome s/p Lt carpal tunnel release and steroids  injection right--.    ---    Bone spur left foot.    ---    Scoliosis.    ---    Spinal stenosis s/p steroids injection.    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 Katrinka Blazing 2010--- never infected, but packed with antibiotics with  surgery;.    ---    Right rotator cuff repairs x 4, complicated by repeat tears, infection,  placement of anchor material.    ---    Elevated liver function tests.    ---    Diabetes.    ---     Allergies    ---     N.K.D.A.    ---     Assessments    ---    1\. Patient understands importance of medication adherence - Z78.9 (Primary)    ---    2\. Compliance with medication regimen - Z91.89    ---    3\.  Nonadherence to medication - Z91.14    ---    4\. Side effect of medication - T88.7XXA    ---     Patient able to recall names of medications: yes    Patient is taking correct dose and frequency: yes    Pill box filled correctly (if applicable): n/a    Number of missed doses: 0    Reason for missed doses (if applicable): n/a    Storing and disposing the medication properly: yes    Financial barriers identified: no    Experiencing benefits from the medication: no    Achieved goals of therapy: no    The patient states she has completed 4-5 weekly injections at this time but  does not know if it is helping (her hands have recently been more sore than  usual, still on prednisone 10mg /day). She denies any side effects from the  medication and does not report any recent infections. She also states the IVIG  is going well other than when she became "violently sick" 3 infusions ago (has  not had a reaction since, unsure if she just had a stomach bug).    ---     Preventive Medicine    ---     Pharmaceutical care plan developed and includes the following: continue  treatment as directed since she has only completed 4-5 injections. Provided  additional counseling and education on the following: side effects; updated  medication list.    Patient verbalized/demonstrated understanding of instructions provided and has  no further questions at this time. The patient's care plan was discussed with  the provider/treatment team. I spent 15 minutes with the patient, 15 minutes  of which were spent counseling. Patient will continue to be followed for  ongoing medication education and adherence monitoring.        ---     Follow Up    ---    prn          * * *         PatientEVONA, [name] DOB: 04-28-1960 Provider: Peter Congo, [name]  03/07/2018    ---    Note generated by eClinicalWorks EMR/PM Software ([email or web address])

## 2018-03-08 ENCOUNTER — Ambulatory Visit

## 2018-03-08 ENCOUNTER — Ambulatory Visit: Admitting: Rheumatology

## 2018-03-08 MED ORDER — Ranitidine HCl: 150 | 90 | Freq: Every day | 0 refills | 0 days | Status: AC

## 2018-03-08 NOTE — Progress Notes (Signed)
* * *        **  [name]**    --- ---    89 Y old Female, DOB: [birth year]    [address], [place], [place] [address]    Home: [phone]    Provider: [name]        * * *    Telephone Encounter    ---    Answered by   Peter Congo  Date: 03/08/2018         Time: 04:34 PM    Reason   Antibiotics for Dental Procedure    --- ---            Message                      Pt called stating [name] previously recommended propphylatic antibiotic (amoxicillin) prior to dental procedures given her immunosuppression. Her dentist previously wrote the prescription but since its been a few years since she went to so they will not prescribe it anymore. Her PCP also will not prescribe.             Also states her ranitidine 150mg  has been recalled- Spoke to [facility] [employer] [facility] who states they still have brands that are not recalls that can be dispense but do not have refills. Sent refill                Action Taken                      [name],[name] , [name] 03/08/2018 4:36:18 PM > [name] - pt is on Orencia, prednisone 10mg  daily and IVIG and requesting antibiotics prior to dental proceure. How would you like to proceed? Thanks!      [name],[name]  03/11/2018 9:10:10 AM > [place], [place]. appt [facility].      [name],[name]  03/12/2018 10:10:30 AM > called pharmacy and no record from previous dentist since they only keep 18 months. Per UTD, 2 g amoxicillin 30-60 minutes prior to dental procedure. faxed rx                Refills  Refill Ranitidine HCl Capsule, 150 MG, Orally, 90, 1 capsule at  bedtime, Once a day, 90 days, Refills=0    --- ---      Start Amoxicillin Capsule, 500 MG, Orally, 4, 4 pill, 30-60 minutes prior to  dental procedure with food, 1 days, Refills=0          * * *                ---          * * *          PatientALETTE, KATAOKA DOB: 12-24-1960 Provider: [name] 03/08/2018    ---    Note generated by eClinicalWorks EMR/PM Software ([email or web address])

## 2018-03-08 NOTE — Telephone Encounter (Signed)
Call Details:   Patient PCP = [name], MD  [name] (Patient) called on March 08, 2018 8:41 AM.  Message taken by: Bartholomew Crews  Primary call-back number: [phone]    Secondary call-back number: () -    Call Reason(s): Prescription and Message/Call-Back        ** REQUESTING A PRESCRIPTION OR REFILL.  Preferred Pharmacy: [id number]*  [address]  [place]  Phone: [phone]  Fax: [phone]    Provide Prescription to Pharmacy    ** MESSAGE / CALL-BACK.  Regarding: Pt wants to request for anitbiotic, Amoxicillin to be sent to the pharmacy because she is going to have an extraction done. Please contact the pt regarding this matter    ---------- ---------- ---------- ---------- ---------- ----------       RESPONSE/ORDERS:  spoke with pt - she stated that she receives an injection that weakens her immune system and she is prone to infection so when she goes to the dentist, she is pre-treated with Amoxicillin "in case they poke her or something"    she had been receiving antibx from the dentist, but has not been seen in 2 years so they will not fill  she will call Dr [name], her Rhematologist  if she cannot reach him, she will call back on Monday to d/w Dr Marlane Hatcher.......................................Marland KitchenClayborne Artist, RN  March 08, 2018 9:02 AM                 ORDERS/PROBS/MEDS/ALL     Problems:   NECK PAIN (ICD-723.1) (ICD10-M54.2)  CELLULITIS, HAND, RIGHT (ICD-682.4) (ICD10-L03.113)  STEROID USE, LONG TERM (ICD-V58.65) (ICD10-Z79.52)  PREMATURE VENTRICULAR CONTRACTIONS (ICD-427.69) (ICD10-I49.3)  HYPERLIPIDEMIA (ICD-272.4) (ICD10-E78.5)  BACK PAIN, LUMBAR, CHRONIC (ICD-724.2) (ICD10-M54.5)  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)  DIABETES MELLITUS, TYPE II (ICD-250.00) (ICD10-E11.9)  LONG-TERM (CURRENT) USE OF STEROIDS (ICD-V58.65) (ICD10-Z79.51)  SKIN SAGGING DUE TO WEIGHT LOSS (ICD-757.9) (ICD10-Q84.9)  TRANSAMINASES, SERUM, ELEVATED (ICD-790.4)  (ICD10-R74.0)  OBESITY, BMI 30-34.9, ADULT (ICD-278.00) (ICD10-E66.9)      DIABETES MELLITUS, TYPE II, UNCONTROLLED (ICD-250.02) (ICD10-E11.65)      FATTY LIVER DISEASE (ICD-571.8) (ICD10-K76.0)  SCLERODERMA, 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)  HYPOTHYROIDISM (ICD-244.9) (ICD10-E03.9)  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) (ICD10-D80.8)  SPINAL STENOSIS, 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)  HEPATITIS C EXPOSURE (HCV RNA NEGATIVE, 01/2014) (ICD-V02.62) (ICD10-Z20.5)  PAIN IN JOINT, HAND (ICD-719.44) (ICD10-M79.643)  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)  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)  CARPAL TUNNEL (ICD-354.0) (ICD10-G56.00)  OSTEOARTHRITIS (ICD-715.09) (ICD10-M15.9)  S/P ROTATOR CUFF SURGERY 12/2004 - RT SHOULDER; 2008 LT (ICD-V45.89)  Family Hx of MELANOMA, FAMILY HX (ICD-V16.8) (ICD10-Z80.8)  FRACTURE OF OTHER SPEC SITE,  PATHOLOGIC - MULTIPLE (ICD-733.19)  CHOLECYSTECTOMY AND HERNIA REPAIR (ICD-V45.89)  VITAMIN D DEFICIENCY (ICD-268.9) (ICD10-E55.9)  COLONOSCOPY, NEXT 2020- SEE COMMENT (ICD-V76.51) (ICD10-Z01.89)    Meds (prior to this call):   HYDROXYCHLOROQUINE SULFATE 200 MG ORAL TABLET (HYDROXYCHLOROQUINE SULFATE) one tablet oral daily; Route: ORAL  ORENCIA 125 MG/ML SUBCUTANEOUS SOLUTION PREFILLED SYRINGE (ABATACEPT) inject once a week; Route: SUBCUTANEOUS  JARDIANCE 10 MG ORAL TABLET (EMPAGLIFLOZIN) Take 1 tablet by mouth once daily; Route: ORAL  ROSUVASTATIN CALCIUM 10 MG ORAL TABLET (ROSUVASTATIN CALCIUM) take one tablet by mouth daily; Route: ORAL  LISINOPRIL 2.5 MG ORAL TABLET (LISINOPRIL) Take one tablet by mouth once daily; Route: ORAL  METFORMIN HCL ER 500 MG XR24H-TAB (METFORMIN HCL) TAKE 4 TABLETS BY MOUTH EVERY MORNING  GLIPIZIDE XL 2.5 MG ORAL TABLET EXTENDED RELEASE 24 HOUR (  GLIPIZIDE) Take one tablet by mouth once daily; Route: ORAL  PREDNISONE 10 MG ORAL TABLET (PREDNISONE) Take 1 tablet by mouth once daily; Route: ORAL  OMEPRAZOLE 20 MG ORAL CAPSULE  DELAYED RELEASE (OMEPRAZOLE) Take one capsule by mouth twice a day; Route: ORAL  IBUPROFEN 600 MG ORAL TABLET (IBUPROFEN) 1 tab by mouth TID as needed for pain  BACLOFEN 10 MG ORAL TABLET (BACLOFEN) Please take half tablet once a day as needed for back spasms  DICLOFENAC SODIUM 1 % TRANSDERMAL GEL (DICLOFENAC SODIUM) APP 2 GRAMS EXT AA BID      FREESTYLE FREEDOM LITE w/Device KIT (BLOOD GLUCOSE MONITORING SUPPL) use as directed (ICD 10 E11.65)      FREESTYLE LITE BLOOD GLUCOSE STRIPS (GLUCOSE BLOOD) USE TO CHECK BLOOD GLUCOSE THREE TIMES DAILY      FREESTYLE LANCETS (LANCETS) check fingerstick three times a day (ICD 10 E11.65)  TRAMADOL HCL 50 MG ORAL TABLET (TRAMADOL HCL) take one tablet daily as needed for pain; Route: ORAL  OXYCODONE HCL 5 MG ORAL TABLET (OXYCODONE HCL) Partial fill upon patient request.  Take one tablet daily as needed for pain >8; Route: ORAL  CALCIUM CARBONATE-VITAMIN D 600-200 MG-UNIT ORAL TABLET (CALCIUM CARBONATE-VITAMIN D) take one tablet twice a day; Route: ORAL  GABAPENTIN 300 MG ORAL CAPSULE (GABAPENTIN) one capsule by mouth at bedtime as needed for pain; Route: ORAL  BACTRIM DS 800-160 MG ORAL TABLET (SULFAMETHOXAZOLE-TRIMETHOPRIM) Take one tablet by mouth twice daily; Route: ORAL            Created By Bartholomew Crews on 03/08/2018 at 08:41 AM    Electronically Signed By Clayborne Artist RN on 03/08/2018 at 09:02 AM

## 2018-03-12 MED ORDER — Amoxicillin: 500 | 4 | 0 refills | 0 days | Status: AC

## 2018-03-12 MED ORDER — Ranitidine HCl: 150 | 90 | Freq: Every day | 0 refills | 0 days | Status: AC

## 2018-03-18 ENCOUNTER — Ambulatory Visit: Admitting: Rheumatology

## 2018-03-18 MED ORDER — Omeprazole: 40 | 90 | Freq: Every day | 1 refills | 0 days | Status: AC

## 2018-03-18 NOTE — Progress Notes (Signed)
* * *        **  [name]**    --- ---    26 Y old Female, DOB: 29-Mar-1961    [address], [place], [place] [address]    Home: [phone]    Provider: [name]        * * *    Telephone Encounter    ---    Answered by   Oletha Blend  Date: 03/18/2018         Time: 11:18 AM    Action Taken                      [name],[name]  03/18/2018 11:19:34 AM > rx send        --- ---            Refills  Refill Omeprazole Capsule Delayed Release, 40 mg, Orally, 90, 1  capsule, Once a day, 90 days, Refills=1    --- ---          * * *                ---          * * *          PatientDenny [name], [name] DOB: December 03, 1960 Provider: [name] 03/18/2018    ---    Note generated by eClinicalWorks EMR/PM Software ([email or web address])

## 2018-03-22 ENCOUNTER — Ambulatory Visit: Admitting: Rheumatology

## 2018-03-22 NOTE — Progress Notes (Signed)
* * *        **  [name]**    --- ---    46 Y old Female, DOB: [birth year]    [address] [place], [place] [address]    Home: [phone]    Provider: [name]        * * *    Telephone Encounter    ---    Answered by   Orson Slick  Date: 03/22/2018         Time: 11:29 AM    Reason   Dental work letter    --- ---            Message                      Dr. Olena Heckle periodontist called regarding dental implants/ Cell [phone]. Office: [phone]. Office fax: [phone]                Action Taken                      [name],[name]  03/22/2018 11:32:58 AM > States fine with pre medication, will rx percocet/NSAID. any personal precautions. perdnisone/healng, ok to move forward w/ surgery and implants. No hx w/ these patients in the past.       [name],[name]  03/22/2018 12:00:17 PM > cant take nsaids, poor heeling and infection, but we will monitor      [name],[name]  03/25/2018 9:38:35 AM > Letter typed and faxed to Dr. Dianna Limbo at [phone]                    * * *                ---          * * *          Patient: [name] DOB: 02/13/1961 Provider: [name] 03/22/2018    ---    Note generated by eClinicalWorks EMR/PM Software ([email or web address])

## 2018-03-25 ENCOUNTER — Ambulatory Visit

## 2018-03-25 MED ORDER — ROSUVASTATIN CALCIUM: 1 | 90 | 0 refills | 0 days | Status: AC

## 2018-03-29 ENCOUNTER — Ambulatory Visit

## 2018-04-01 ENCOUNTER — Ambulatory Visit

## 2018-04-12 ENCOUNTER — Ambulatory Visit: Admitting: "Endocrinology

## 2018-04-12 ENCOUNTER — Ambulatory Visit

## 2018-04-12 ENCOUNTER — Ambulatory Visit: Admitting: Internal Medicine

## 2018-04-12 ENCOUNTER — Ambulatory Visit: Admitting: Rheumatology

## 2018-04-12 ENCOUNTER — Ambulatory Visit: Admitting: Adult Health

## 2018-04-12 ENCOUNTER — Ambulatory Visit: Admit: 2018-04-12 | Payer: HMO

## 2018-04-12 ENCOUNTER — Ambulatory Visit: Admit: 2018-04-12

## 2018-04-12 LAB — HX POINT OF CARE: HX HGB A1C, POC: 7.5 % — ABNORMAL HIGH

## 2018-04-12 MED ORDER — SULFAMETHOXAZOLE-TRIMETHOPRIM: 1 | 14 | 0 refills | 0 days | Status: DC

## 2018-04-12 MED ORDER — Famotidine: 40 | 30 | Freq: Every day | 5 refills | 0 days | Status: AC

## 2018-04-12 MED ORDER — Physical Therapy: 0 refills | 0 days | Status: AC

## 2018-04-12 NOTE — Progress Notes (Signed)
 General Medicine Visit  .  Service Due by Standard Protocol Rules: DIAB EYE EX, PATPORTALPIN.    [name]  Initial Screening   * [place]: Mathieu ([place])  Ht: 62.5 in.  Wt: 169.6 lbs.   BMI: 30.64  Temp: 98.3 deg F.     BP (Initial [place] Screening): 119 / 76      BP (Rechecked, Actionable): 119 /   76 mmHg   HR: 86     .  Med List: PRINTED by [place] for patient   Travel outside of the [place] in past 28 days:: No  Chronic Pain Assessment: Does pt experience chronic pain ? YES  Severity of pain? (min=0, max=10) 6  Smoking Assessment: Tobacco use? never smoker  .  .  .  Falls Risk Assessment:   In the past year, have you ... Had no falls  Difficulty with balance? NO  Need assistance with ambulation while here? NO  .  Behavioral Health Tools:   PHQ2 Screening Score 0  .  ......................................Marland KitchenMerline Mathieu  April 12, 2018 9:  39 AM  .  Chief Complaint:   f/up MMP  .  History of Present Illness:   Went to see  [facility], Endocrine this morning. Has DEXA scheduled after this a  ppointment.  .  Still working on weight loss.  .  Still has MSK complaints, hasn't seen Ortho since shoulder was replaced. Ga  nglion cyst came back - noticed it on Sunday. Planning to see Dr. [name] a  gain.  [facility] recommended PT and acupuncture. They gave her PT referral. Also think  ing about acupuncture.   .  Takes gabapentin and baclofen once every 5-7 days. knows about side effects  . Only takes it at night 1-2 times a week.  .  .  .  .  .  Family History: (reviewed)   Father: DMII, died of MI at 70  Mom: died of lung CA at 95  MGM and PGM: lung ca (smokers)  MGF and several other second degree relatives: brain aneurysms in their 32'  s.  2 maternal uncles:melanoma (one died of unknown cancer, one living)  brother recently diagnosed with systemic scleroderma  .  Social History: (reviewed)   Non-smoker, rare etoh, no IVDU. 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 Living together with her lifelong partner of  /  R/20 years - may be getting legally married to him soon. Sexually active wi  th husband only. No children. Always wears a seat belt.  .  .  A complete ROS was done. All positive responses are listed in HPI section.   All other systems reviewed in detail and are negative.  .  .  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)  DIABETES MELLITUS, TYPE II (ICD-250.00) (ICD10-E11.9)  LONG-TERM (CURRENT) USE OF STEROIDS (ICD-V58.65) (ICD10-Z79.51)  SKIN SAGGING DUE TO WEIGHT LOSS (ICD-757.9) (ICD10-Q84.9)  TRANSAMINASES, SERUM, ELEVATED (ICD-790.4) (ICD10-R74.0)  OBESITY, BMI 30-34.9, ADULT (ICD-278.00) (ICD10-E66.9)      DIABETES MELLITUS, TYPE II, UNCONTROLLED (ICD-250.02) (ICD10-E11.65)      FATTY LIVER DISEASE (ICD-571.8) (ICD10-K76.0)  SCLERODERMA, 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)  HYPOTHYROIDISM (ICD-244.9) (ICD10-E03.9)  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) (ICD10-D  80.8)  SPINAL STENOSIS, 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)  HEPATITIS C EXPOSURE (HCV RNA NEGATIVE, 01/2014) (ICD-V02.62) (ICD10-Z20.5)  PAIN IN JOINT, HAND (ICD-719.44) (ICD10-M79.643)  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  Past Medical History (changes today):  Removed problem of CELLULITIS, HAND, RIGHT (ICD-682.4) (ICD10-L03.113)  Added new problem of 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) (ICD10-M47.812)  Problems Reviewed:  Done  .  [name]  Medications (prior to today's visit):  HYDROXYCHLOROQUINE SULFATE 200 MG ORAL TABLET (HYDROXYCHLOROQUINE SULFATE)   one tablet oral daily; Route: ORAL  ORENCIA 125 MG/ML SUBCUTANEOUS SOLUTION PREFILLED SYRINGE (ABATACEPT) injec  t once a week; Route: SUBCUTANEOUS  JARDIANCE 10 MG ORAL TABLET (EMPAGLIFLOZIN) Take 1 tablet by mouth once dai  ly; Route: ORAL  ROSUVASTATIN 10MG  TABLETS (ROSUVASTATIN CALCIUM) TAKE 1 TABLET BY MOUTH EVE  RY DAY  LISINOPRIL 2.5 MG ORAL TABLET (LISINOPRIL) Take one tablet by mouth once da  ily; Route: ORAL  METFORMIN HCL ER 500 MG XR24H-TAB (METFORMIN HCL) TAKE 4 TABLETS BY MOUTH E  VERY MORNING  GLIPIZIDE XL 2.5 MG ORAL TABLET EXTENDED RELEASE 24 HOUR (GLIPIZIDE) Take o  ne tablet by mouth once daily; Route: ORAL  PREDNISONE 10 MG ORAL TABLET (PREDNISONE) Take 1 tablet by mouth once daily  ; Route: ORAL  OMEPRAZOLE 20 MG ORAL CAPSULE DELAYED RELEASE (OMEPRAZOLE) Take one capsule   by mouth twice a day; Route: ORAL  IBUPROFEN 600 MG ORAL TABLET (IBUPROFEN) 1 tab by mouth TID as needed for p  ain  BACLOFEN 10 MG ORAL TABLET (BACLOFEN) Please take half tablet once a day as   needed for back spasms  DICLOFENAC SODIUM 1 % TRANSDERMAL GEL (DICLOFENAC SODIUM) APP 2 GRAMS EXT A  A BID      FREESTYLE FREEDOM LITE w/Device KIT (BLOOD GLUCOSE MONITORING SUPPL) [place]  e as directed (ICD 10 E11.65)      FREESTYLE LITE BLOOD GLUCOSE STRIPS (GLUCOSE BLOOD) USE TO CHECK BLOOD   GLUCOSE THREE TIMES DAILY      FREESTYLE LANCETS (LANCETS) check fingerstick three times a day (ICD 10   E11.65)  TRAMADOL HCL 50 MG ORAL TABLET (TRAMADOL HCL) take one tablet daily as need  ed for pain; Route: ORAL  OXYCODONE HCL 5 MG ORAL TABLET (OXYCODONE HCL) Partial fill upon patient  re  quest.  Take one tablet daily as needed for pain >8; Route: ORAL  CALCIUM CARBONATE-VITAMIN D 600-200 MG-UNIT ORAL TABLET (CALCIUM CARBONATE-  VITAMIN D) take one tablet twice a day; Route: ORAL  GABAPENTIN 300 MG ORAL CAPSULE (GABAPENTIN) one capsule by mouth at bedtime   as needed for pain; Route: ORAL  BACTRIM DS 800-160 MG ORAL TABLET (SULFAMETHOXAZOLE-TRIMETHOPRIM) Take one   tablet by mouth twice daily; Route: ORAL  .  Medications (after today's visit):  HYDROXYCHLOROQUINE SULFATE 200 MG ORAL TABLET (HYDROXYCHLOROQUINE SULFATE)   one tablet oral daily; Route: ORAL  ORENCIA 125 MG/ML SUBCUTANEOUS SOLUTION PREFILLED SYRINGE (ABATACEPT) injec  t once a week; Route: SUBCUTANEOUS  JARDIANCE 10 MG ORAL TABLET (EMPAGLIFLOZIN) Take 1 tablet by mouth once dai  ly; Route: ORAL  ROSUVASTATIN 10MG  TABLETS (ROSUVASTATIN  CALCIUM) TAKE 1 TABLET BY MOUTH EVE  RY DAY  LISINOPRIL 2.5 MG ORAL TABLET (LISINOPRIL) Take one tablet by mouth once da  ily; Route: ORAL  METFORMIN HCL ER 500 MG XR24H-TAB (METFORMIN HCL) TAKE 4 TABLETS BY MOUTH E  VERY MORNING  GLIPIZIDE XL 2.5 MG ORAL TABLET EXTENDED RELEASE 24 HOUR (GLIPIZIDE) Take o  ne tablet by mouth once daily; Route: ORAL  PREDNISONE 10 MG ORAL TABLET (PREDNISONE) Take 1 tablet by mouth once daily  ; Route: ORAL  OMEPRAZOLE 20 MG ORAL CAPSULE DELAYED RELEASE (OMEPRAZOLE) Take one capsule   by mouth twice a day; Route: ORAL  IBUPROFEN 600 MG ORAL TABLET (IBUPROFEN) 1 tab by mouth TID as needed for p  ain  BACLOFEN 10 MG ORAL TABLET (BACLOFEN) Please take half tablet once a day as   needed for back spasms  DICLOFENAC SODIUM 1 % TRANSDERMAL GEL (DICLOFENAC SODIUM) APP 2 GRAMS EXT A  A BID      FREESTYLE FREEDOM LITE w/Device KIT (BLOOD GLUCOSE MONITORING SUPPL) [place]  e as directed (ICD 10 E11.65)      FREESTYLE LITE BLOOD GLUCOSE STRIPS (GLUCOSE BLOOD) USE TO CHECK BLOOD   GLUCOSE THREE TIMES DAILY      FREESTYLE LANCETS (LANCETS) check fingerstick three times a day (ICD 10    E11.65)  TRAMADOL HCL 50 MG ORAL TABLET (TRAMADOL HCL) take one tablet daily as need  ed for pain; Route: ORAL  OXYCODONE HCL 5 MG ORAL TABLET (OXYCODONE HCL) Partial fill upon patient re  quest.  Take one tablet daily as needed for pain >8; Route: ORAL  CALCIUM CARBONATE-VITAMIN D 600-200 MG-UNIT ORAL TABLET (CALCIUM CARBONATE-  VITAMIN D) take one tablet twice a day; Route: ORAL  GABAPENTIN 300 MG ORAL CAPSULE (GABAPENTIN) one capsule by mouth at bedtime   as needed for pain; Route: ORAL  FAMOTIDINE 20 MG ORAL TABLET (FAMOTIDINE) ; Route: ORAL  .  Medications Reviewed:  Done  .  [name]  No Known Allergies  Allergies Reviewed:  Done  .  .  .  .  .  Vitals:   Ht: 62.5 in.  Wt: 169.6 lbs.  BMI (in-lb) 30.64  Temp: 98.3deg F.     BP (Initial [place] Screening): 119 / 76     BP (Rechecked, Actionable): 119 / 7  6 mmHg   Pulse Rate: 86 bpm  .  .  Additional PE:   Gen: Alert, NAD, well hydrated, overweight  CV: RRR, no M/R/G, no edema  Pulm: CTA b/l, resp effort wnl  MS:  Slightly antalgic gait but stable   Right hand wrist has gangliin cyst  Psych: Mood and affect appropriate  .  [name]  Assessment /T/ Plan:   DIABETES MELLITUS, TYPE II (ICD-250.00) (ICD10-E11.9)  Continue care w/ Endocrine  - medications: metformin, glipizide, jardiance  - check FS  - BP control is excellent on 2.5mg  lisinopril  - diabetic eye appt - in 03/2018  - neuropathy/foot exam: seeing new podiatrist outside of [place]  - encourage diet/exercise  - LDL - 110 (01/04/2018)   - ZOXW9U - 7/7 (01/04/2018) Went down to 7.5% today  - urine microalbumin - 28 (01/04/2018)  .  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 Recommen  ded Shingrix.  [name]  HYPOTHYROIDISM: Trialing off levothyroxine  (patient would like to re  duce # of meds) Check TSH with next bloodwork  .  LONG-TERM (CURRENT) USE OF STEROIDS (ICD-V58.65) (ICD10-Z79.51), LONG TERM   PPI USE; SCLERODERMA, LIMITED (  ICD-710.1) continue care with [name]. Ch  ronic prednisone use is  ongoing. continue Ca+Vit D and omeprazole. DEXA 201  7 showed normal bone mass; having repeat DEXA 04/12/2018.   [name]  OBESITY (ICD-278.00) (ICD10-E66.9) Has lost 3 more pounds since last visit.   Congratulated her on weight loss efforts through diet: reviewed with her t  hat since 2017 to now 2019, she has gone from BMI 39 to 30.  [name]  HYPERLIPIDEMIA (ICD-272.4) (ICD10-E78.5) Continue rosuvastatin. Lipids chec  ked 11/2017  .  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 Encouraged her to consider acupuncture as her   rheumatologist suggested as well  .  SKIN SAGGING DUE TO WEIGHT LOSS (ICD-757.9) (ZOX09-U04.5) Noted in chart.   .  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 in [facility] [employer], still on prednisone, as above.  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  CERVICAL SPONDYLOSIS  Sparing use of baclofen, gabapentin for this. PT, acupuncture as above  .  RTC in 6 months for f/up MMP: will need labs, order colonos   .  [name]  Orders (this visit):  RTC in 6 months [RTC-180]  Est Level 4 [CPT-99214]  .  Patient Care Plan  .  [name]  Immunization Worksheet 2019   .  [name]  Electronically Signed by [name], MD on 04/12/2018 at 10:29 AM  ________________________________________________________________________

## 2018-04-12 NOTE — Progress Notes (Signed)
 * * *      [name], [name]**    ------    [name], DOB: May 08, 1960, External MRN: [id number]    Account Number: [id number]    [address], [place], [place]    Home: [phone]    Guarantor: [name] Insurance: [id number] PPO    PCP: [name], MD Referring: [name], MD External Visit ID: [id number]    Appointment Facility: Endocrinology        * * *    04/12/2018  **Appointment Provider:** [name], NP **CHN#:** [id number]    ------     **Supervising Provider:** Paulla Dolly, MD    ---       **Current Medications**    ---    Taking    * Amoxicillin 500 MG Capsule 4 pill Orally 30-60 minutes prior to dental procedure with food    ---    * Baclofen 10 MG Tablet 1/2 tab Orally prn    ---    * GlipiZIDE XL 2.5 mg Tablet Extended Release 24 Hour 1 tablet Orally Once a day    ---    * Glucophage XR 500 mg Tablet Extended Release 24 Hour 4 tablets Orally Once a day    ---    * Hydroxychloroquine Sulfate 200 mg Tablet 1 tablet with food or milk Orally Once a day    ---    * Jardiance 10 MG Tablet 1 tablet Orally Once a day    ---    * Magnesium Cl-Calcium Carbonate 71.5-119 MG Tablet Delayed Release 1 Orally Once a day, Notes: pt unsure strength, says also has vitamin d in it    ---    * Omeprazole 40 mg Capsule Delayed Release 1 capsule Orally Once a day    ---    * Orencia ClickJect 125 MG/ML Solution Auto-injector 1 ml Subcutaneous once weekly    ---    * PredniSONE 10 MG Tablet 2 tablet Orally Once a day, Notes: Currently taking 10mg /day    ---    * Ranitidine HCl 150 MG Capsule 1 capsule at bedtime Orally Once a day    ---    * Rosuvastatin Calcium 10 MG Tablet 1 tablet Orally Once a day    ---    * Tramadol HCl 50 MG Tablet 1 tablet Orally every 6 hrs, Notes: PRN    ---    * Voltaren 1 % Gel 2 grams Transdermal twice daily, Notes: PRN    ---    * Medication List reviewed and reconciled with the patient    ---     Past Medical History    ---      Scleroderma - CREST dx 2007.        ---     IgG4 deficiency s/p IVIG 2010 ----single infusion given preventively after  week of bilateral knee replacements at [facility] 2010.        ---    Fx left wrist in 1994.        ---    Blood clot at LUE in 2006 on a short course of Coumadin.        ---    Bilateral carpal tunnel syndrome s/p Lt carpal tunnel release and steroids  injection right--.        ---    Bone spur left foot.        ---    Scoliosis.        ---  Spinal stenosis s/p steroids injection.        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 Katrinka Blazing 2010--- never infected, but packed with antibiotics with  surgery;.        ---    Right rotator cuff repairs x 4, complicated by repeat tears, infection,  placement of anchor material.        ---    Elevated liver function tests.        ---    Diabetes.        ---      **Surgical History**    ---      Right rotator cuff repair, with infected hardware that had to be removed  2006    ---    Repeat right shoulder surgery, also which became infected. 2007    ---    Left rotator cuff repair 2008    ---    bilateral knee replacements 2009    ---    Left arthroscopic carpal tunnel release 01/2011    ---    ORIF Left 4th metatarsal bone    ---    Left Ulna shortening 1994    ---    Knee replacement 2010    ---    Hand surgery 2013, 2015    ---    remove gallbladder/hernia 1990    ---    Plate left foot 3rd metatarsal 2008    ---      **Family History**    ---      Mother: deceased 35 yrs, lung cancer, hyperthyroidism, diagnosed with Other  malignant neoplasm of unspecified site    ---    Father: deceased 40s yrs, heart attack, Unspecified heart disease    ---    3 brother(s) .    ---    FH of arthritis and scleroderma\nFather deceased from MI\nMother deceased age  39 with lung cancer \\nBrother  with scleroderma \/ copd.    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    Alcohol    _Former daily EtOH use in 20s_    Tobacco  history: Never smoked.  Nonsmoker.    Lives with longstanding boyfriend.    ---      **Allergies**    ---      N.K.D.A.    ---    [name]]      **Hospitalization/Major Diagnostic Procedure**    ---      as above    ---     **Review of Systems**    ---     _Diabetes_ :    General: no unintended weight change, no fevers, malaise, weakness. Eyes: no  vision loss. Cardiovascular: no CP, no orthopnea, no palpitations.  Respiratory: no wheezing, no dyspnea. Gastrointestinal: no n/v, no abdominal  pain. Musculoskeletal: no edema. Skin: no rash. Neurological: no numbness.          **Reason for Appointment**    ---      1\. Followup for diabetes    ---      **History of Present Illness**    ---     _General_ :    58 y/o female with scleroderma (on prednisone), IgG-4 deficiency, NASH and  prior uncontrolled T2DM. Seen for initial visit on 03/15/16 for management of  hyperglycemia in diabetes.    Here for routine 3 month f/u today. [name] feels her blood  sugars have been  stable. She has had no unexplained hypoglycemia since her last visit.    Diabetes Meds:    glipizide 2.5mg     metformin 2000mg  daily    jardiance 10mg  once daily    _ __ _-    reports hypoglycemia 40-60's range on a higher dose of glipizide-5mg , 10mg  ,    glipizide 2.5mg     metformin 2000mg  daily    jardiance 10mg  once daily    _ __ _-    reports hypoglycemia 40-60's range on a higher dose of glipizide-5mg , 10mg .    A1c Trend:    04/12/18-----7.5%    01/04/18-----7.7%    09/14/17-----10%    03/02/16-----8.7%    08/05/15-----11%    11/04/14-----7.1%    12/31/13-----7%    05/06/13-----6.6%    01/04/18-----7.7%    09/14/17-----10%    03/02/16-----8.7%    08/05/15-----11%.    Recent Blood Glucoses:    meter downloaded onto [employer] (04/12/18)    30 day bg average= 145 +/- 63    83% in range (70-180), 17% above range (>180)    limited bg readings.    Diet:    3 meals daily, low CHO    working on reducing fat/cho portions--seen by Nutrition 04/2016  .    Exercise:    walks 1-2 mi daily with dog , .    Microvascular Complications Screening:    eyes: no acute vision changes, no hx of retinopathy, reports annual exam with  ophthalmologist is scheduled    kidneys: creat:0.91, egfr 71 (09/14/17), **uACR:137 (06/2017)**    feet: denies numbess or parethesias in feet, seen by Dr. Vassie Loll- hammering of  the lesser toes, given rx for custom orthotics , .    Macrovascular Complications Screening:    (+) HLD    on rosuvastatin.     **Vital Signs**    ---    Pain scale 5, Ht-in 62, Wt-lbs 170, BMI 31.09, BP 135/85, HR 92, BSA 1.83, O2  98, Ht-cm 157.48, Wt-kg 77.11, Wt Change -5 lb.      **Physical Examination**    ---     _Diabetes Follow Up:_ :    General: AAO X 3, NAD.    CV: heart rate is regular, S1S2    lungs: unlabored respirations at rest, clear bilaterally    counseled for 20 minutes of this 25 minute visit.      **Assessments**    ---    1\. Type 2 diabetes mellitus with hyperglycemia, without long-term current use  of insulin - E11.65 (Primary)    ---    2\. Other hyperlipidemia - E78.4    ---    3\. Obesity (BMI 30-39.9) - E66.9    ---     [name] has a history of obesity, hypothyroidism, scleroderma, hld and  uncontrolled T2DM. In recent months her blood sugars have been more stable.  Since her last visit 3 months ago, [name] denies any unexplained or severe  hypoglycemia. POC A1c testing today is 7.5%. A 30 day review of her blood  glucose readings demonstrates a bg average of 145, though there is a limited  quantity of blood glucose readings. Recommended continued oral therapy with  glipizide, metformin and jardiance. Again reinforced dietary and lifestyle  impact on blood glucose. Instructed pt to notify me of any unexplained or  severe hypoglycemia. I would consider increasing the jardiance and stopping  the glipizide altogether. Will hold off on this for now as she has been doing  so well. We can also consider a 2 week cgm trial if further blood glucose  data  is needed at the next visit.    On rosuvastatin for hx of hyperlipidemia. Could add zetia to statin if LDL  remains above goal of <100.    Weight is down another 5lbs in the past 3 months. Reviewed and reinforced  lifestyle measures.    ---      **Treatment**    ---      **1\. Type 2 diabetes mellitus with hyperglycemia, without long-term  current use of insulin**    Continue GlipiZIDE XL Tablet Extended Release 24 Hour, 5 MG, 1 tablet, Orally,  Once a day    Continue Glucophage XR Tablet Extended Release 24 Hour, 500 mg, 4 tablets,  Orally, Once a day    Continue Jardiance Tablet, 10 MG, 1 tablet, Orally, Once a day    ---         **2\. Other hyperlipidemia**    Continue Rosuvastatin Calcium Tablet, 10 MG, 1 tablet, Orally, Once a day         **3\. Others**    Notes: 1) Please remember to do your repeat blood tests at least one week  prior to your next scheduled follow up visit with [facility]. If blood  tests are not done in advance of the appointment or if an appointment is  canceled/missed, you will be notified of test results via mail. 2) Please  check your medications and notify us as soon as possible if you need refills,  with at least one week''s notice. We cannot guarantee that refill requests can  be processed in a timely fashion sooner than this. , 3) Please remember to  bring your blood sugar meter and/or logs to every office visit., 4) Please  allow at least 24 hours for any required return phone call. I am in clinic or  available by phone Tuesday-Friday. I am out of the clinic on Mondays. If your  issue or concern is urgent you may ask to speak to the doctor on call if I am  not available.... I have reviewed the findings, assessment and treatment plan  with [name], NP and have edited Progress Note as required. A1C in a decent  range. No change in Rx for now. On moderate intensity statin for CV  prevention.     **Procedure Codes**    ---      4010 [id number] GLYCOSYLATED HEMOGLOBINS    ---       **Follow Up**    ---    3 Months    **Appointment Provider:** [name], NP    Electronically signed by Paulla Dolly , MD on 04/12/2018 at 08:40 PM EST    Sign off status: Completed        * * *        Endocrinology    [address]    [place], [address]    [place], [place] [address]    Tel: [phone]    Fax: [phone]              * * *         Patient: [name], [name] DOB: 22-Apr-1960 Progress Note: [name], NP  04/12/2018    ---    Note generated by eClinicalWorks EMR/PM Software ([email or web address])

## 2018-04-12 NOTE — Progress Notes (Signed)
 .  Progress Notes  .  Patient: [name]  Provider: [name]  DOB: [birth year] Age: 58 Y Sex: Female  Supervising Provider:: Paulla Dolly, MD  Date: 04/12/2018  .  PCP: [name]  MD  Date: 04/12/2018  .  --------------------------------------------------------------------------------  .  REASON FOR APPOINTMENT  .  1. Followup for diabetes  .  HISTORY OF PRESENT ILLNESS  .  General:  58 y/o female with scleroderma (on  prednisone), IgG-4 deficiency, NASH and prior uncontrolled T2DM.  Seen for initial visit on 03/15/16 for management of  hyperglycemia in diabetes.Here for routine 3 month f/u today.  [name] feels her blood sugars have been stable. She has had no  unexplained hypoglycemia since her last visit.  Diabetes Meds:  .  glipizide 2.5mg   metformin 2000mg  daily  jardiance 10mg  once daily  _ __ _-  reports hypoglycemia 40-60's range on a higher dose of  glipizide-5mg , 10mg  ,  glipizide 2.5mg   metformin 2000mg  daily  jardiance 10mg  once daily  _ __ _-  reports hypoglycemia 40-60's range on a higher dose of  glipizide-5mg , 10mg .  .  A1c Trend:  .  04/12/18-----7.5%  01/04/18-----7.7%  09/14/17-----10%  03/02/16-----8.7%  08/05/15-----11%  11/04/14-----7.1%  12/31/13-----7%  05/06/13-----6.6%  01/04/18-----7.7%  09/14/17-----10%  03/02/16-----8.7%  08/05/15-----11%.  .  Recent Blood Glucoses:  .  meter downloaded onto [employer] (04/12/18)  30 day bg average= 145 +/- 63  83% in range (70-180), 17% above range (>180)  limited bg readings.  .  Diet:  .  3 meals daily, low CHO  working on reducing fat/cho portions--seen by Nutrition 04/2016 .  [name]  Exercise:  .  walks 1-2 mi daily with dog , .  .  Microvascular Complications Screening:  .  eyes: no acute vision changes, no hx of retinopathy, reports  annual exam with ophthalmologist is scheduled  kidneys: creat:0.91, egfr 71 (09/14/17), uACR:137 (06/2017)  feet: denies numbess or parethesias in feet, seen by Dr. Vassie Loll-  hammering of the lesser toes, given rx for  custom orthotics , .  .  Macrovascular Complications Screening:  .  (+) HLD  on rosuvastatin.  [name]  CURRENT MEDICATIONS  .  Taking Amoxicillin 500 MG Capsule 4 pill Orally 30-60 minutes  prior to dental procedure with food  Taking Baclofen 10 MG Tablet 1/2 tab Orally prn  Taking GlipiZIDE XL 2.5 mg Tablet Extended Release 24 Hour 1  tablet Orally Once a day  Taking Glucophage XR 500 mg Tablet Extended Release 24 Hour 4  tablets Orally Once a day  Taking Hydroxychloroquine Sulfate 200 mg Tablet 1 tablet with  food or milk Orally Once a day  Taking Jardiance 10 MG Tablet 1 tablet Orally Once a day  Taking Magnesium Cl-Calcium Carbonate 71.5-119 MG Tablet Delayed  Release 1 Orally Once a day, Notes: pt unsure strength, says also  has vitamin d in it  Taking Omeprazole 40 mg Capsule Delayed Release 1 capsule Orally  Once a day  Taking Orencia ClickJect 125 MG/ML Solution Auto-injector 1 ml  Subcutaneous once weekly  Taking PredniSONE 10 MG Tablet 2 tablet Orally Once a day, Notes:  Currently taking 10mg /day  Taking Ranitidine HCl 150 MG Capsule 1 capsule at bedtime Orally  Once a day  Taking Rosuvastatin Calcium 10 MG Tablet 1 tablet Orally Once a  day  Taking Tramadol HCl 50 MG Tablet 1 tablet Orally every 6 hrs,  Notes: PRN  Taking Voltaren 1 % Gel 2 grams  Transdermal twice daily, Notes:  PRN  Medication List reviewed and reconciled with the patient  .  PAST MEDICAL HISTORY  .  Scleroderma - CREST dx 2007  IgG4 deficiency s/p IVIG 2010 ----single infusion given  preventively after week of bilateral knee replacements at [facility]  2010  Fx left wrist in 1994  Blood clot at LUE in 2006 on a short course of Coumadin  Bilateral carpal tunnel syndrome s/p Lt carpal tunnel release and  steroids injection right--  Bone spur left foot  Scoliosis  Spinal stenosis s/p steroids injection  s/p bilateral knee replacements for valgus /arthritic  complications, performed by Dr. Katrinka Blazing 2010--- never infected, but  packed with antibiotics with  surgery;  Right rotator cuff repairs x 4, complicated by repeat tears,  infection, placement of anchor material  Elevated liver function tests  Diabetes  .  ALLERGIES  .  N.K.D.A.  .  SURGICAL HISTORY  .  Right rotator cuff repair, with infected hardware that had to be  removed 2006  Repeat right shoulder surgery, also which became infected. 2007  Left rotator cuff repair 2008  bilateral knee replacements 2009  Left arthroscopic carpal tunnel release 01/2011  ORIF Left 4th metatarsal bone  Left Ulna shortening 1994  Knee replacement 2010  Hand surgery 2013, 2015  remove gallbladder/hernia 1990  Plate left foot 3rd metatarsal 2008  .  FAMILY HISTORY  .  Mother: deceased 51 yrs, lung cancer, hyperthyroidism, diagnosed  with Other malignant neoplasm of unspecified site  Father: deceased 14s yrs, heart attack, Unspecified heart disease  3 brother(s) .  FH of arthritis and scleroderma\nFather deceased from MI\nMother  deceased age 29 with lung cancer \\nBrother  with scleroderma \/  copd.  .  SOCIAL HISTORY  .  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  .  .  Alcohol  Former daily EtOH use in 20s  .  [name]  Tobacco  history: Never smoked.  .  Nonsmoker.Lives with longstanding boyfriend.  [name]  HOSPITALIZATION/MAJOR DIAGNOSTIC PROCEDURE  .  as above  .  REVIEW OF SYSTEMS  .  Diabetes:  .  General:    no unintended weight change, no fevers, malaise,  weakness . Eyes:    no vision loss . Cardiovascular:    no CP, no  orthopnea, no palpitations . Respiratory:    no wheezing, no  dyspnea . Gastrointestinal:    no n/v, no abdominal pain .  Musculoskeletal:    no edema . Skin:    no rash . Neurological:     no numbness .  [name]  VITAL SIGNS  .  Pain scale 5, Ht-in 62, Wt-lbs 170, BMI 31.09, BP 135/85, HR 92,  BSA 1.83, O2 98, Ht-cm 157.48, Wt-kg 77.11, Wt Change -5 lb.  .  PHYSICAL EXAMINATION  .  Diabetes Follow Up::  General:  AAO X 3, NAD.  CV: heart rate is regular, S1S2lungs: unlabored respirations at  rest, clear  bilaterallycounseled for 20 minutes of this 25 minute  visit.  .  ASSESSMENTS  .  Type 2 diabetes mellitus with hyperglycemia, without long-term  current use of insulin - E11.65 (Primary)  .  Other hyperlipidemia - E78.4  .  Obesity (BMI 30-39.9) - E66.9  .  [name] has a history of obesity, hypothyroidism, scleroderma,  hld and uncontrolled T2DM. In recent months her blood sugars have  been more stable. Since her last visit 3 months ago, [name] denies  any unexplained or severe hypoglycemia. POC A1c  testing today is  7.5%. A 30 day review of her blood glucose readings demonstrates  a bg average of 145, though there is a limited quantity of blood  glucose readings. Recommended continued oral therapy with  glipizide, metformin and jardiance. Again reinforced dietary and  lifestyle impact on blood glucose. Instructed pt to notify me of  any unexplained or severe hypoglycemia. I would consider  increasing the jardiance and stopping the glipizide altogether.  Will hold off on this for now as she has been doing so well. We  can also consider a 2 week cgm trial if further blood glucose  data is needed at the next visit.On rosuvastatin for hx of  hyperlipidemia. Could add zetia to statin if LDL remains above  goal of <100.Weight is down another 5lbs in the past 3 months.  Reviewed and reinforced lifestyle measures.  .  TREATMENT  .  Type 2 diabetes mellitus with hyperglycemia,  without long-term current use of insulin  Continue GlipiZIDE XL Tablet Extended Release 24 Hour, 5 MG, 1  tablet, Orally, Once a day  Continue Glucophage XR Tablet Extended Release 24 Hour, 500 mg, 4  tablets, Orally, Once a day  Continue Jardiance Tablet, 10 MG, 1 tablet, Orally, Once a day  .  [name]  Other hyperlipidemia  Continue Rosuvastatin Calcium Tablet, 10 MG, 1 tablet, Orally,  Once a day  .  [name]  Others  Notes: 1) Please remember to do your repeat blood tests at least  one week prior to your next scheduled follow up visit with [facility].  If blood tests are not done in advance of the  appointment or if an appointment is canceled/missed, you will be  notified of test results via mail. 2) Please check your  medications and notify us as soon as possible if you need  refills, with at least one week''s notice. We cannot guarantee  that refill requests can be processed in a timely fashion sooner  than this. , 3) Please remember to bring your blood sugar meter  and/or logs to every office visit., 4) Please allow at least 24  hours for any required return phone call. I am in clinic or  available by phone Tuesday-Friday. I am out of the clinic on  Mondays. If your issue or concern is urgent you may ask to speak  to the doctor on call if I am not available.... I have reviewed  the findings, assessment and treatment plan with [name], NP  and have edited Progress Note as required. A1C in a decent range.  No change in Rx for now. On moderate intensity statin for CV  prevention.  [name]  PROCEDURE CODES  .  [id number] GLYCOSYLATED HEMOGLOBINS  .  FOLLOW UP  .  3 Months  .  [name]  Appointment Provider: [name], NP  .  Electronically signed by Paulla Dolly , MD on  04/12/2018 at 08:40 PM EST  .  CONFIRMATORY SIGN OFF  .  [name]  Document electronically signed by [name]    .

## 2018-04-12 NOTE — Progress Notes (Signed)
 * * *      [name]**    ------    [name], DOB: [birth year], External MRN: [id number]    Account Number: [id number]    [address] [place], [id number]    Home: [phone]    Guarantor: [name] Insurance: [id number] [employer] PPO    PCP: [name], MD Referring: [name], MD    Appointment Facility: [facility]umatology, All[facility] Immunology        * * *    04/12/2018 Progress Notes: Orson Slick, PA **CHN#:** [id number]    ------    ---       **Reason for Appointment**    ---      1\. Rheumatoid arthritis/IVIg deficiency    ---      **History of Present Illness**    ---     _GENERAL_ :    Today [name] presents for follow up for IVIg deficiency and rheumatoid  arthritis.    --She is now Orencia as of her last visit. She tolerates the injection well.  She denies injection site reaction, fevers, chills, frequent infections. Feels  the injection itself is more tolerable than Humira.    --At this time she states she is unsure if Dub Amis is helping or not though.  Her hands have been very sore over the past 2 months, but states she has only  had about 2-3 months of Orencia so understands it might take more time.    --She endorses 45 minutes of morning stiffness. Which is overall unchanged    --Pain is about 6-7 every day. She also endorses worsening pain in her back  and neck.    --Feels that 3/4 of the time she is "bent over" because her back is so sore.  She states about 7 years ago she got injections in her back for the spinal  stenosis here at [facility] with significant improvement.    --She endorses pain in the neckl, for which she got x-rays w/ her PCP and  revealed spinal stenosis. She is interested in PT at this time.    --She continues on IVIg with good tolerance. She had 1 infusion in which she  was unable to fully complete the medication because she became ill, but since  then she has had 2 infusions and both went well without any adverse reaction.  Last labs completed during the infusion. She has  her next infusion in 2 weeks.    --She notes recent dental work with tooth extraction and soon to be implant.  Went well without complications. Healing well.    --Additionally, she endorses cold hands/feet. She is doing conservative  measures to keep them warm as much as possible. Feels this winter has been the  worst.      **Current Medications**    ---    Taking    * Baclofen 10 MG Tablet 1/2 tab Orally prn    ---    * Calcium Carbonate-Vitamin D 600-200 MG-UNIT Capsule 1 capsule with a meal Orally Twice a day    ---    * GlipiZIDE XL 5 MG Tablet Extended Release 24 Hour 1 tablet Orally Once a day    ---    * Glucophage XR 500 mg Tablet Extended Release 24 Hour 4 tablets Orally Once a day    ---    * Hydroxychloroquine Sulfate 200 mg Tablet 1 tablet with food or milk Orally Once a day    ---    *  Omeprazole 40 mg Capsule Delayed Release 1 capsule Orally Once a day    ---    * Orencia     ---    * PredniSONE 10 MG Tablet 2 tablet Orally Once a day    ---    * Ranitidine HCl 150 mg Capsule 1 capsule at bedtime Orally Once a day    ---    * Rosuvastatin Calcium 10 MG Tablet 1 tablet Orally Once a day    ---    * Sulindac 150 mg Tablet 1 tablet with food Orally Twice a day    ---    * Tramadol HCl 50 MG Tablet 1 tablet Orally every 6 hrs    ---    * Voltaren 1 % Gel 2 grams Transdermal twice daily    ---    Not-Taking/PRN    * Humira Pen 40 MG/0.8ML Pen-injector Kit 0.8 ml Subcutaneous every 2 weeks    ---    Discontinued    * Levothyroxine Sodium 25 MCG Tablet 1 tablet Orally Once a day    ---    * Medication List reviewed and reconciled with the patient    ---      **Past Medical History**    ---      Scleroderma - CREST dx 2007.        ---    IgG4 deficiency s/p IVIG 2010 ----single infusion given preventively after  week of bilateral knee replacements at [facility] 2010.        ---    Fx left wrist in 1994.        ---    Blood clot at LUE in 2006 on a short course of Coumadin.        ---    Bilateral carpal tunnel  syndrome s/p Lt carpal tunnel release and steroids  injection right--.        ---    Bone spur left foot.        ---    Scoliosis.        ---    Spinal stenosis s/p steroids injection.        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 Katrinka Blazing 2010--- never infected, but packed with antibiotics with  surgery;.        ---    Right rotator cuff repairs x 4, complicated by repeat tears, infection,  placement of anchor material.        ---    Elevated liver function tests.        ---    Diabetes.        ---      **Surgical History**    ---      Right rotator cuff repair, with infected hardware that had to be removed  2006    ---    Repeat right shoulder surgery, also which became infected. 2007    ---    Left rotator cuff repair 2008    ---    bilateral knee replacements 2009    ---    Left arthroscopic carpal tunnel release 01/2011    ---    ORIF Left 4th metatarsal bone    ---    Left Ulna shortening 1994    ---    Knee replacement 2010    ---    Hand surgery 2013, 2015    ---    remove gallbladder/hernia 1990    ---  Plate left foot 3rd metatarsal 2008    ---      **Family History**    ---      Mother: deceased 31 yrs, lung cancer, hyperthyroidism, diagnosed with Other  malignant neoplasm of unspecified site    ---    Father: deceased 73s yrs, heart attack, Unspecified heart disease    ---    3 brother(s) .    ---    FH of arthritis and scleroderma\nFather deceased from MI\nMother deceased age  66 with lung cancer \\nBrother  with scleroderma \/ copd.    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    Alcohol    _Former daily EtOH use in 20s_    Tobacco history: Never smoked.  Nonsmoker.    Lives with longstanding boyfriend.    ---      **Allergies**    ---      N.K.D.A.    ---      **Hospitalization/Major Diagnostic Procedure**    ---      as above    ---     **Review of Systems**    ---     _Rheumatology_ :     General No fever, weight loss, swollen glands, fatigue. Muscoskeletal **+  joint pain or swelling, morning stiffness, muscle pain**. Eyes No vision loss,  eye dry, mouth dry, red eye, eye pain. Mouth No dry mouth , mouth sores.  Cardiovascular **+swelling/whitening of hands/feet**. Pulmonary No cough,  cough blood, shortness of breath. Gastrointestinal No Abdominal pain, N/v,  diarrhea, constipation, bloody stools, heartburn. Genitoruinary No difficulty  urinating, bloody urine, pain with urination. Skin No rash, photosensitivity.  Lymphatics No swollen glands, tender glands. Neurological No seizures,  headache, paresthesias, localized weakness. Mental health No anxiety,  depression, sleep disturbance.         **Vital Signs**    ---    Pain scale 5, Ht-in 62, Wt-lbs 170, BMI 31.09, BP 135/85, HR 92.      **Examination**    ---     _Rheumatology_ :    General Appearance  Alert and oriented , No apparent distress .    HENT:  No, alopecia .    Eyes:  No, scleral icterus, scleral erythema .    CV:  regular rate rhythm .    Ext:  trace LE edema .    Skin:  No, rash .    Psych:  No anxiety depression.    Muskuloskeletal Elbows, shoulders, hips, knees have intact ROM with no  synovitis. Hands have synovitis in the PIPs and MCPs bilaterally..    Skin and Nails Minimal sclerodactyly, deep ridge at mid left 3rd nail with  yellow discoloration, growing out with normal nail at the base.    _RAPID 3_ :    Function    (0-10): _0.7_        Pain    (0-10): _7.0_        Patient Global    (0-10): _1.0_          **Assessments**    ---    1\. Type 2 diabetes mellitus with hyperglycemia, without long-term current use  of insulin - E11.65 (Primary)    ---    2\. 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 Long-term use of high-risk medication - Z79.899    ---    4\. Other hyperlipidemia - E78.4    ---  5\. Hammertoe of second toe of left foot - M20.42    ---    6\. Synovitis of hand - M65.9    ---    7\. IgG4  deficiency - D80.3    ---    8\. Onychomycosis - B35.1, Discussed difficulty in treating. Begin OTC tx and  monitor. Improvement as of Jan 2020 appt.    ---     She has now been on Orencia for the past 2 months without any notable  improvement. Her hands continue to be painful w/ morning stiffness, as well as  back and neck pain, although she has known hx of spinal stenosis. We discussed  continuing orencia as it is still early on, to which she is agreeable. She  also continues to do well with the IVIg without any adverse reaction beyond 1  episode of illness. She had recent labs with infusion which were reviewed. Her  next infusion is in 2 weeks. She has diclofenac gel that helps when the pain  in her hands is severe, so I have advised her to use it on a schedule BID to  which she is agreeable. We have switched the ranitidine with famotidine for tx  for reflux when needed. She will continue plaquenil as prior. For her neck and  back she will begin PT in the interim. She will think about injections in the  future pending status after PT. We will see her back in 3 months for follow  up, or sooner if needed. She will follow up with [place] in 3 months, or sooner if  needed. This plan was discussed in great detail with [name], to which she  is agreeable.    PLAN:    --Continue Orencia 250mg  every 4 weeks    --Continue prednisone 20mg  daily; DEXA scan today    --Increase use of diclofenac gel to BID    --Switch ranitidine to famotidine for reflux control    --Begin PT for neck and back pain    --Continue IVIg infusions, labs reviewed today.    ---      **Treatment**    ---      **1\. Seronegative rheumatoid arthritis**    Start Famotidine Tablet, 40 MG, 1 tablet at bedtime, Orally, Once a day, 30  day(s), 30, Refills 5    Continue Hydroxychloroquine Sulfate Tablet, 200 mg, 1 tablet with food or  milk, Orally, Once a day    Continue Orencia Solution Reconstituted, 250 MG, 1 ml, Intravenous, every 4  weeks    Continue  Voltaren Gel, 1 %, 2 grams, Transdermal, twice daily    Start Physical Therapy 0, Evaluation and treatment of:, Neck and back for  spinal stenosis    ---      **Follow Up**    ---    3 Months w/ [name]    Electronically signed by Orson Slick , PA-C on 04/12/2018 at 02:48 PM EST    Sign off status: Completed        * * *        Rheumatology, Allergy and Immunology    [address]    [facility] [address] [place], [place] [address]    Tel: [phone]    Fax: [phone]              * * *         Patient: [name], [name] DOB: 01-24-1961 Progress Note: Orson Slick, PA  04/12/2018    ---  Note generated by eClinicalWorks EMR/PM Software ([email or web address])

## 2018-04-12 NOTE — Progress Notes (Signed)
 .  Progress Notes  .  Patient: [name]  Provider: Orson Slick    .  DOB: [birth year] Age: 58 Y Sex: Female  .  PCP: [name]  MD  Date: 04/12/2018  .  --------------------------------------------------------------------------------  .  REASON FOR APPOINTMENT  .  1. Rheumatoid arthritis/IVIg deficiency  .  HISTORY OF PRESENT ILLNESS  .  GENERAL:   Today Ms. Estrada presents for follow up for IVIg deficiency and  rheumatoid arthritis. --She is now Orencia as of her last visit.  She tolerates the injection well. She denies injection site  reaction, fevers, chills, frequent infections. Feels the  injection itself is more tolerable than Humira.--At this time she  states she is unsure if Dub Amis is helping or not though. Her  hands have been very sore over the past 2 months, but states she  has only had about 2-3 months of Orencia so understands it might  take more time.--She endorses 45 minutes of morning stiffness.  Which is overall unchanged--Pain is about 6-7 every day. She also  endorses worsening pain in her back and neck. --Feels that 3/4 of  the time she is "bent over" because her back is so sore. She  states about 7 years ago she got injections in her back for the  spinal stenosis here at [facility] with significant improvement.--She  endorses pain in the neckl, for which she got x-rays w/ her PCP  and revealed spinal stenosis. She is interested in PT at this  time.--She continues on IVIg with good tolerance. She had 1  infusion in which she was unable to fully complete the medication  because she became ill, but since then she has had 2 infusions  and both went well without any adverse reaction. Last labs  completed during the infusion. She has her next infusion in 2  weeks.--She notes recent dental work with tooth extraction and  soon to be implant. Went well without complications. Healing  well. --Additionally, she endorses cold hands/feet. She is doing  conservative measures to keep them warm as much as  possible.  Feels this winter has been the worst.  .  CURRENT MEDICATIONS  .  Taking Baclofen 10 MG Tablet 1/2 tab Orally prn  Taking Calcium Carbonate-Vitamin D 600-200 MG-UNIT Capsule 1  capsule with a meal Orally Twice a day  Taking GlipiZIDE XL 5 MG Tablet Extended Release 24 Hour 1 tablet  Orally Once a day  Taking Glucophage XR 500 mg Tablet Extended Release 24 Hour 4  tablets Orally Once a day  Taking Hydroxychloroquine Sulfate 200 mg Tablet 1 tablet with  food or milk Orally Once a day  Taking Omeprazole 40 mg Capsule Delayed Release 1 capsule Orally  Once a day  Taking Orencia  Taking PredniSONE 10 MG Tablet 2 tablet Orally Once a day  Taking Ranitidine HCl 150 mg Capsule 1 capsule at bedtime Orally  Once a day  Taking Rosuvastatin Calcium 10 MG Tablet 1 tablet Orally Once a  day  Taking Sulindac 150 mg Tablet 1 tablet with food Orally Twice a  day  Taking Tramadol HCl 50 MG Tablet 1 tablet Orally every 6 hrs  Taking Voltaren 1 % Gel 2 grams Transdermal twice daily  Not-Taking/PRN Humira Pen 40 MG/0.8ML Pen-injector Kit 0.8 ml  Subcutaneous every 2 weeks  Discontinued Levothyroxine Sodium 25 MCG Tablet 1 tablet Orally  Once a day  Medication List reviewed and reconciled with the patient  .  PAST MEDICAL HISTORY  .  Scleroderma - CREST dx 2007  IgG4 deficiency s/p IVIG 2010 ----single infusion given  preventively after week of bilateral knee replacements at [facility]  2010  Fx left wrist in 1994  Blood clot at LUE in 2006 on a short course of Coumadin  Bilateral carpal tunnel syndrome s/p Lt carpal tunnel release and  steroids injection right--  Bone spur left foot  Scoliosis  Spinal stenosis s/p steroids injection  s/p bilateral knee replacements for valgus /arthritic  complications, performed by Dr. Katrinka Blazing 2010--- never infected, but  packed with antibiotics with surgery;  Right rotator cuff repairs x 4, complicated by repeat tears,  infection, placement of anchor material  Elevated liver function  tests  Diabetes  .  ALLERGIES  .  N.K.D.A.  .  SURGICAL HISTORY  .  Right rotator cuff repair, with infected hardware that had to be  removed 2006  Repeat right shoulder surgery, also which became infected. 2007  Left rotator cuff repair 2008  bilateral knee replacements 2009  Left arthroscopic carpal tunnel release 01/2011  ORIF Left 4th metatarsal bone  Left Ulna shortening 1994  Knee replacement 2010  Hand surgery 2013, 2015  remove gallbladder/hernia 1990  Plate left foot 3rd metatarsal 2008  .  FAMILY HISTORY  .  Mother: deceased 26 yrs, lung cancer, hyperthyroidism, diagnosed  with Other malignant neoplasm of unspecified site  Father: deceased 74s yrs, heart attack, Unspecified heart disease  3 brother(s) .  FH of arthritis and scleroderma\nFather deceased from MI\nMother  deceased age 74 with lung cancer \\nBrother  with scleroderma \/  copd.  .  SOCIAL HISTORY  .  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  .  .  Alcohol  Former daily EtOH use in 20s  .  [name]  Tobacco  history: Never smoked.  .  Nonsmoker.Lives with longstanding boyfriend.  [name]  HOSPITALIZATION/MAJOR DIAGNOSTIC PROCEDURE  .  as above  .  REVIEW OF SYSTEMS  .  Rheumatology:  .  General    No fever, weight loss, swollen glands, fatigue .  Muscoskeletal    +joint pain or swelling, morning stiffness,  muscle pain . Eyes    No vision loss, eye dry, mouth dry, red  eye, eye pain . Mouth    No dry mouth , mouth sores .  Cardiovascular    +swelling/whitening of hands/feet . Pulmonary     No cough, cough blood, shortness of breath . Gastrointestinal     No Abdominal pain, N/v, diarrhea, constipation, bloody stools,  heartburn . Genitoruinary    No difficulty urinating, bloody  urine, pain with urination . Skin    No rash, photosensitivity .  Lymphatics    No swollen glands, tender glands . Neurological     No seizures, headache, paresthesias, localized weakness . Mental  health    No anxiety, depression, sleep disturbance .  [name]  VITAL  SIGNS  .  Pain scale 5, Ht-in 62, Wt-lbs 170, BMI 31.09, BP 135/85, HR 92.  [name]  EXAMINATION  .  Rheumatology:  General Appearance Alert and oriented , No apparent distress .  [name]  HENT: No, alopecia .  [name]  Eyes: No, scleral icterus, scleral erythema .  [name]  CV: regular rate rhythm .  [name]  Ext: trace LE edema .  [name]  Skin: No, rash .  [name]  Psych: No anxiety depression.  .  MuskuloskeletalElbows, shoulders, hips, knees have intact ROM  with no synovitis. Hands have synovitis in the PIPs and MCPs  bilaterally..  .  Skin and NailsMinimal sclerodactyly, deep ridge at mid left 3rd  nail with yellow discoloration, growing out with normal nail at  the base.  [name]  RAPID 3: Function  (0-10): 0.7  Pain  (0-10):7.0  Patient Global  (0-10):1.0  .  ASSESSMENTS  .  Type 2 diabetes mellitus with hyperglycemia, without long-term  current use of insulin - E11.65 (Primary)  .  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  Long-term use of high-risk medication - Z79.899  .  Other hyperlipidemia - E78.4  .  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  Synovitis of hand - M65.9  .  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  Onychomycosis - B35.1, Discussed difficulty in treating. Begin  OTC tx and monitor. Improvement as of Jan 2020 appt.  .  She has now been on Orencia for the past 2 months without any  notable improvement. Her hands continue to be painful w/ morning  stiffness, as well as back and neck pain, although she has known  hx of spinal stenosis. We discussed continuing orencia as it is  still early on, to which she is agreeable. She also continues to  do well with the IVIg without any adverse reaction beyond 1  episode of illness. She had recent labs with infusion which were  reviewed. Her next infusion is in 2 weeks. She has diclofenac gel  that helps when the pain in her hands is severe, so I have  advised her to use it on a schedule BID to which she is  agreeable. We have switched the ranitidine with famotidine for tx  for reflux when needed. She  will continue plaquenil as prior. For  her neck and back she will begin PT in the interim. She will  think about injections in the future pending status after PT. We  will see her back in 3 months for follow up, or sooner if needed.  She will follow up with [place] in 3 months, or sooner if needed. This  plan was discussed in great detail with Ms. Giovannini, to which she  is agreeable.PLAN:--Continue Orencia 250mg  every 4  weeks--Continue prednisone 20mg  daily; DEXA scan today--Increase  use of diclofenac gel to BID--Switch ranitidine to famotidine for  reflux control--Begin PT for neck and back pain --Continue IVIg  infusions, labs reviewed today.  .  TREATMENT  .  Seronegative rheumatoid arthritis  Start Famotidine Tablet, 40 MG, 1 tablet at bedtime, Orally, Once  a day, 30 day(s), 30, Refills 5  Continue Hydroxychloroquine Sulfate Tablet, 200 mg, 1 tablet with  food or milk, Orally, Once a day  Continue Orencia Solution Reconstituted, 250 MG, 1 ml,  Intravenous, every 4 weeks  Continue Voltaren Gel, 1 %, 2 grams, Transdermal, twice daily  Start Physical Therapy 0, Evaluation and treatment of:, Neck and  back for spinal stenosis  .  FOLLOW UP  .  3 Months w/ [name]  .  Electronically signed by Orson Slick , PA-C on  04/12/2018 at 02:48 PM EST  .  Document electronically signed by Orson Slick    .

## 2018-04-12 NOTE — Progress Notes (Signed)
 General Medicine Visit    Service Due by Standard Protocol Rules: DIAB EYE EX, PATPORTALPIN.      Initial Screening   * [place]: [name] ([name])  Ht: 62.5 in.  Wt: 169.6 lbs.   BMI: 30.64  Temp: 98.3 deg F.     BP (Initial [place] Screening): 119 / 76      BP (Rechecked, Actionable): 119 / 76 mmHg   HR: 86       Med List: PRINTED by [place] for patient   Travel outside of the [place] in past 28 days:: No  Chronic Pain Assessment: Does pt experience chronic pain ? YES  Severity of pain? (min=0, max=10) 6  Smoking Assessment: Tobacco use? never smoker        Falls Risk Assessment:   In the past year, have you ... Had no falls  Difficulty with balance? NO  Need assistance with ambulation while here? NO    Behavioral Health Tools:   PHQ2 Screening Score 0    ......................................Marland KitchenMerline [name]  April 12, 2018 9:39 AM    Chief Complaint:   f/up MMP    History of Present Illness:   Went to see  [facility]he[facility], Endocrine this morning. Has DEXA scheduled after this appointment.    Still working on weight loss.    Still has MSK complaints, hasn't seen Ortho since shoulder was replaced. Ganglion cyst came back - noticed it on Sunday. Planning to see Dr. [name] again.  Rheum recommended PT and acupuncture. They gave her PT referral. Also thinking about acupuncture.     Takes gabapentin and baclofen once every 5-7 days. knows about side effects. Only takes it at night 1-2 times a week.            Family History: (reviewed)   Father: DMII, died of MI at 59  Mom: died of lung CA at 24  MGM and PGM: lung ca (smokers)  MGF and several other second degree relatives: brain aneurysms in their 45's.  2 maternal uncles:melanoma (one died of unknown cancer, one living)  brother recently diagnosed with systemic scleroderma    Social History: (reviewed)   Non-smoker, rare etoh, no IVDU. 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 Living together with her lifelong partner of ~20 years - may be getting legally married  to him soon. Sexually active with husband only. No children. Always wears a seat belt.      A complete ROS was done. All positive responses are listed in HPI section. All other systems reviewed in detail and are negative.      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)  DIABETES MELLITUS, TYPE II (ICD-250.00) (ICD10-E11.9)  LONG-TERM (CURRENT) USE OF STEROIDS (ICD-V58.65) (ICD10-Z79.51)  SKIN SAGGING DUE TO WEIGHT LOSS (ICD-757.9) (ICD10-Q84.9)  TRANSAMINASES, SERUM, ELEVATED (ICD-790.4) (ICD10-R74.0)  OBESITY, BMI 30-34.9, ADULT (ICD-278.00) (ICD10-E66.9)      DIABETES MELLITUS, TYPE II, UNCONTROLLED (ICD-250.02) (ICD10-E11.65)      FATTY LIVER DISEASE (ICD-571.8) (ICD10-K76.0)  SCLERODERMA, 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)  HYPOTHYROIDISM (ICD-244.9) (ICD10-E03.9)  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) (ICD10-D80.8)  SPINAL STENOSIS, 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)  HEPATITIS C EXPOSURE (HCV RNA NEGATIVE, 01/2014) (ICD-V02.62) (ICD10-Z20.5)  PAIN IN JOINT, HAND (XBM-841.32) (GMW10-U72.536)  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)  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)  CARPAL TUNNEL (ICD-354.0) (ICD10-G56.00)  OSTEOARTHRITIS (ICD-715.09) (ICD10-M15.9)  S/P ROTATOR CUFF SURGERY 12/2004 - RT SHOULDER; 2008 LT (ICD-V45.89)  Family Hx of MELANOMA, FAMILY HX (ICD-V16.8) (ICD10-Z80.8)  FRACTURE OF OTHER SPEC SITE,  PATHOLOGIC - MULTIPLE (ICD-733.19)  CHOLECYSTECTOMY AND HERNIA REPAIR (ICD-V45.89)  VITAMIN D DEFICIENCY (ICD-268.9) (ICD10-E55.9)  COLONOSCOPY, NEXT 2020- SEE COMMENT (ICD-V76.51) (ICD10-Z01.89)    Past Medical History  (changes today):  Removed problem of CELLULITIS, HAND, RIGHT (ICD-682.4) (ICD10-L03.113)  Added new problem of 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) (ICD10-M47.812)     Problems Reviewed:  Done      Medications (prior to today's visit):  HYDROXYCHLOROQUINE SULFATE 200 MG ORAL TABLET (HYDROXYCHLOROQUINE SULFATE) one tablet oral daily; Route: ORAL  ORENCIA 125 MG/ML SUBCUTANEOUS SOLUTION PREFILLED SYRINGE (ABATACEPT) inject once a week; Route: SUBCUTANEOUS  JARDIANCE 10 MG ORAL TABLET (EMPAGLIFLOZIN) Take 1 tablet by mouth once daily; Route: ORAL  ROSUVASTATIN 10MG  TABLETS (ROSUVASTATIN CALCIUM) TAKE 1 TABLET BY MOUTH EVERY DAY  LISINOPRIL 2.5 MG ORAL TABLET (LISINOPRIL) Take one tablet by mouth once daily; Route: ORAL  METFORMIN HCL ER 500 MG XR24H-TAB (METFORMIN HCL) TAKE 4 TABLETS BY MOUTH EVERY MORNING  GLIPIZIDE XL 2.5 MG ORAL TABLET EXTENDED RELEASE 24 HOUR (GLIPIZIDE) Take one tablet by mouth once daily; Route: ORAL  PREDNISONE 10 MG ORAL TABLET (PREDNISONE) Take 1 tablet by mouth once daily; Route: ORAL  OMEPRAZOLE 20 MG ORAL CAPSULE DELAYED RELEASE (OMEPRAZOLE) Take one capsule by mouth twice a day; Route: ORAL  IBUPROFEN 600 MG ORAL TABLET (IBUPROFEN) 1 tab by mouth TID as needed for pain  BACLOFEN 10 MG ORAL TABLET (BACLOFEN) Please take half tablet once a day as needed for back spasms  DICLOFENAC SODIUM 1 % TRANSDERMAL GEL (DICLOFENAC SODIUM) APP 2 GRAMS EXT AA BID      FREESTYLE FREEDOM LITE w/Device KIT (BLOOD GLUCOSE MONITORING SUPPL) use as directed (ICD 10 E11.65)      FREESTYLE LITE BLOOD GLUCOSE STRIPS (GLUCOSE BLOOD) USE TO CHECK BLOOD GLUCOSE THREE TIMES DAILY      FREESTYLE LANCETS (LANCETS) check fingerstick three times a day (ICD 10 E11.65)  TRAMADOL HCL 50 MG ORAL TABLET (TRAMADOL HCL) take one tablet daily as needed for pain; Route: ORAL  OXYCODONE HCL 5 MG ORAL TABLET (OXYCODONE HCL) Partial fill upon patient request.  Take one tablet daily as needed for pain >8; Route: ORAL  CALCIUM  CARBONATE-VITAMIN D 600-200 MG-UNIT ORAL TABLET (CALCIUM CARBONATE-VITAMIN D) take one tablet twice a day; Route: ORAL  GABAPENTIN 300 MG ORAL CAPSULE (GABAPENTIN) one capsule by mouth at bedtime as needed for pain; Route: ORAL  BACTRIM DS 800-160 MG ORAL TABLET (SULFAMETHOXAZOLE-TRIMETHOPRIM) Take one tablet by mouth twice daily; Route: ORAL    Medications (after today's visit):  HYDROXYCHLOROQUINE SULFATE 200 MG ORAL TABLET (HYDROXYCHLOROQUINE SULFATE) one tablet oral daily; Route: ORAL  ORENCIA 125 MG/ML SUBCUTANEOUS SOLUTION PREFILLED SYRINGE (ABATACEPT) inject once a week; Route: SUBCUTANEOUS  JARDIANCE 10 MG ORAL TABLET (EMPAGLIFLOZIN) Take 1 tablet by mouth once daily; Route: ORAL  ROSUVASTATIN 10MG  TABLETS (ROSUVASTATIN CALCIUM) TAKE 1 TABLET BY MOUTH EVERY DAY  LISINOPRIL 2.5 MG ORAL TABLET (LISINOPRIL) Take one tablet by mouth once daily; Route: ORAL  METFORMIN HCL ER 500 MG XR24H-TAB (METFORMIN HCL) TAKE 4 TABLETS BY MOUTH EVERY MORNING  GLIPIZIDE XL 2.5 MG ORAL TABLET EXTENDED RELEASE 24 HOUR (GLIPIZIDE) Take one tablet by mouth once daily; Route: ORAL  PREDNISONE 10 MG ORAL TABLET (PREDNISONE)  Take 1 tablet by mouth once daily; Route: ORAL  OMEPRAZOLE 20 MG ORAL CAPSULE DELAYED RELEASE (OMEPRAZOLE) Take one capsule by mouth twice a day; Route: ORAL  IBUPROFEN 600 MG ORAL TABLET (IBUPROFEN) 1 tab by mouth TID as needed for pain  BACLOFEN 10 MG ORAL TABLET (BACLOFEN) Please take half tablet once a day as needed for back spasms  DICLOFENAC SODIUM 1 % TRANSDERMAL GEL (DICLOFENAC SODIUM) APP 2 GRAMS EXT AA BID      FREESTYLE FREEDOM LITE w/Device KIT (BLOOD GLUCOSE MONITORING SUPPL) use as directed (ICD 10 E11.65)      FREESTYLE LITE BLOOD GLUCOSE STRIPS (GLUCOSE BLOOD) USE TO CHECK BLOOD GLUCOSE THREE TIMES DAILY      FREESTYLE LANCETS (LANCETS) check fingerstick three times a day (ICD 10 E11.65)  TRAMADOL HCL 50 MG ORAL TABLET (TRAMADOL HCL) take one tablet daily as needed for pain; Route: ORAL  OXYCODONE  HCL 5 MG ORAL TABLET (OXYCODONE HCL) Partial fill upon patient request.  Take one tablet daily as needed for pain >8; Route: ORAL  CALCIUM CARBONATE-VITAMIN D 600-200 MG-UNIT ORAL TABLET (CALCIUM CARBONATE-VITAMIN D) take one tablet twice a day; Route: ORAL  GABAPENTIN 300 MG ORAL CAPSULE (GABAPENTIN) one capsule by mouth at bedtime as needed for pain; Route: ORAL  FAMOTIDINE 20 MG ORAL TABLET (FAMOTIDINE) ; Route: ORAL       Medications Reviewed:  Done      No Known Allergies  Allergies Reviewed:  Done            Vitals:   Ht: 62.5 in.  Wt: 169.6 lbs.  BMI (in-lb) 30.64  Temp: 98.3deg F.     BP (Initial [place] Screening): 119 / 76     BP (Rechecked, Actionable): 119 / 76 mmHg   Pulse Rate: 86 bpm      Additional PE:   Gen: Alert, NAD, well hydrated, overweight  CV: RRR, no M/R/G, no edema  Pulm: CTA b/l, resp effort wnl  MS:  Slightly antalgic gait but stable   Right hand wrist has gangliin cyst  Psych: Mood and affect appropriate      Assessment & Plan:   DIABETES MELLITUS, TYPE II (ICD-250.00) (ICD10-E11.9)  Continue care w/ Endocrine  - medications: metformin, glipizide, jardiance  - check FS  - BP control is excellent on 2.5mg  lisinopril  - diabetic eye appt - in 03/2018  - neuropathy/foot exam: seeing new podiatrist outside of [place]  - encourage diet/exercise  - LDL - 110 (01/04/2018)   - YQIH4V - 7/7 (01/04/2018) Went down to 7.5% today  - urine microalbumin - 28 (01/04/2018)    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 Recommended Shingrix.    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. Chronic prednisone use is ongoing. continue Ca+Vit D and omeprazole. DEXA 2017 showed normal bone mass; having repeat DEXA 04/12/2018.     OBESITY (ICD-278.00) (ICD10-E66.9) Has lost 3  more pounds since last visit. Congratulated her on weight loss efforts through diet: reviewed with her that since 2017 to now 2019, she has gone from BMI 39 to 30.    HYPERLIPIDEMIA (ICD-272.4) (ICD10-E78.5) Continue rosuvastatin. 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 Encouraged her to consider acupuncture as her rheumatologist suggested as well    SKIN SAGGING DUE TO WEIGHT LOSS (ICD-757.9) (ZOX09-U04.5) Noted in chart.     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 in Rheum. Starting Orencia, still on prednisone, as above.    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 PT, acupuncture as above    RTC in 6 months for f/up MMP: will need labs, order colonos       Orders (this visit):  RTC in 6 months [RTC-180]  Est Level 4 [CPT-99214]    Patient Care Plan      Immunization Worksheet 2019           Created By Marita Snellen on 04/12/2018 at 09:36 AM    Electronically Signed By [name], MD on 04/12/2018 at 10:29 AM

## 2018-04-23 LAB — HX OSTEOPOROSIS

## 2018-04-26 LAB — UNMAPPED LAB RESULTS: BUN (EXT): 21 mg/dL — ABNORMAL HIGH (ref 7–20)

## 2018-04-29 ENCOUNTER — Ambulatory Visit

## 2018-05-03 ENCOUNTER — Ambulatory Visit

## 2018-05-03 ENCOUNTER — Ambulatory Visit: Admitting: Hand Surgery

## 2018-05-03 ENCOUNTER — Ambulatory Visit: Admit: 2018-05-03 | Payer: HMO

## 2018-05-08 ENCOUNTER — Ambulatory Visit: Admitting: Rheumatology

## 2018-05-08 NOTE — Progress Notes (Signed)
 * * *      [name]**    ------    85 Y old Female, DOB: 1960-04-17    [address] [place], [place] [address]    Home: [phone]    Provider: [name]        * * *    Telephone Encounter    ---    Answered by  Oletha Blend Date: 05/08/2018       Time: 11:51 AM    Reason  Wrist update/med change    ------            Message                     Patient would like a call back to discuss Dr. Delsa Grana findings of her right wrist xray. She stated she is in pain and taking Ibuprofen, she also got a cotisone shot but its did not work.                Action Taken                     [name],[name]  05/09/2018 9:51:53 AM > LVM x 1      [name],[name]  05/09/2018 10:15:27 AM > Pt saw Dr. Domingo Cocking she got back pain in the right wrist where he cyst was previously located.  Got bruise across the wrist, then states cyst returned.  Painful x 3 days then pain resolved, thought nothing of it. Now, states that "outside of wrist" became painful a few weeks after that. Saw Dr. [name] a week ago Friday because pain is so severe. States that the arthritis seems to be worsening. Advised try a cortisone shot, helped for 30 hours, but now all back again. Feels like "something isnt working"      [name],[name]  05/10/2018 10:32:02 AM > TBD, [name] will think. Surgery may be lower risk than changing meds.  Probably cannot do actemra/kevzra. No JAK. hx TNF w/o great response. Currently on OrenciaZoe- do you have any recommendations?      [name],[name] , [name] 05/15/2018 2:46:13 PM > Can she try Rituxan?      [name],[name]  05/17/2018 9:03:02 AM > [name]- " I don't know, ask Zoe is there are any case reports in patients using rituxan with CVID"      [name],[name] , [name] 05/17/2018 10:09:44 AM > [email or web address]      [email or web address]      [email or web address]://[email or web address]/doi/[email or web address]/ajh.[email or web address]       [name],[name]  05/24/2018 2:05:03 PM > schedule time in [place] calendar      [name],[name]  05/30/2018 10:39:42 AM > LVM x 1      CHUI,TRAVIS  05/31/2018 6:58:28 PM > she called back      [name],[name]  06/03/2018 9:50:31 AM > Left messge for pt to call back      [name],[name]  06/03/2018 10:01:53 AM > 11 am thurday 11:30 am Tuesday 3/10. emailed Tarique Loveall. confirmed with patient. links emailed to [place]                    * * *                ---          * * *         Patient: [name] DOB: Jul 25, 1960 Provider: [name] 05/08/2018    ---    Note generated by  eClinicalWorks EMR/PM Software ([email or web address])

## 2018-05-23 LAB — UNMAPPED LAB RESULTS: BUN (EXT): 24 mg/dL — ABNORMAL HIGH (ref 7–20)

## 2018-05-24 ENCOUNTER — Ambulatory Visit

## 2018-06-06 ENCOUNTER — Ambulatory Visit: Admitting: Rheumatology

## 2018-06-06 NOTE — Progress Notes (Signed)
* * *      **  [name]**    ------    [name], DOB: July 23, 1960    [address], [place], [place] [address]    Home: [phone]    Provider: [name]        * * *    Telephone Encounter    ---    Answered by  [name] Date: 06/06/2018       Time: 11:22 AM    Message                     Had a long conversation with Mrs. Kishi about the risks and benefits of using rituximab for RA in people with CVID.  We found a number of case series which have generally good outcomes, particularly those on IVIG as she is.  However there is a risk of serious infections and the patient also brought up the issue of the potential corona virus pandemic.  I let her know that this medication lasts in the body for a long time and therefore its harder to "stop" if she were to get sick from COVID-19 or anything else.  She let me know that the more conservative measures she is using now like diclofenac gel and prednisone allow her to function and given that, she wants to hold off on rituximab for now.  She will reconsider in 2-3 months and already has appts scheduled.        ------                * * *                ---          * * *         Patient: Capitano, Ambria DOB: Aug 02, 1960 Provider: [name] 06/06/2018    ---    Note generated by eClinicalWorks EMR/PM Software ([email or web address])

## 2018-06-10 ENCOUNTER — Ambulatory Visit

## 2018-06-10 MED ORDER — GLIPIZIDE: 1 | 90 | 1 refills | 0 days | Status: AC

## 2018-06-10 MED ORDER — GLIPIZIDE: 1 | 90 | 0 refills | 0 days | Status: AC

## 2018-06-10 MED ORDER — LISINOPRIL: 1 | 90 | 1 refills | 0 days | Status: AC

## 2018-06-10 MED ORDER — GABAPENTIN: 1 | 30 | 0 refills | 0 days | Status: AC

## 2018-06-10 MED ORDER — BACLOFEN: 0.5 | 20 | 0 refills | 0 days | Status: AC

## 2018-06-10 MED ORDER — LISINOPRIL: 1 | 30 | 0 refills | 0 days | Status: AC

## 2018-06-10 MED ORDER — PredniSONE: 10 | 60 | 0 refills | 0 days | Status: AC

## 2018-06-24 ENCOUNTER — Ambulatory Visit

## 2018-07-04 ENCOUNTER — Ambulatory Visit

## 2018-07-09 ENCOUNTER — Ambulatory Visit

## 2018-07-09 MED ORDER — PredniSONE: 10 | 60 | 1 refills | 0 days | Status: AC

## 2018-07-12 ENCOUNTER — Ambulatory Visit: Admitting: Rheumatology

## 2018-07-12 ENCOUNTER — Ambulatory Visit

## 2018-07-12 ENCOUNTER — Ambulatory Visit: Admit: 2018-07-12 | Payer: HMO

## 2018-07-12 MED ORDER — Voltaren: 1 | 1 | Freq: Two times a day (BID) | 11 refills | 0 days | Status: AC

## 2018-07-12 NOTE — Progress Notes (Signed)
* * *      [name], [name] **DOB:** November 29, 1960 (58 yo F) **Acc No.** [id number] **DOS:**  07/12/2018    ---       [name], [name]**    ------    89 Y old Female, DOB: [birth year]    [address] [place], [place] [address]    Home: [phone]    Provider: [name]        * * *    Telephone Encounter    ---    Answered by  Oletha Blend Date: 07/12/2018       Time: 11:18 AM    Reason  Appointment    ------            Message                     is this a telephone follow up or in person?                Action Taken                     [name],[name]  07/12/2018 1:46:53 PM > this was video      [name],[name]  07/12/2018 2:01:44 PM > no silly the follow up with Dr.Safiya Girdler      [name],[name]  07/12/2018 2:18:22 PM > ohhhh... hmmm I guess either.  I hope in person, but if this is crazy still it would be moved to telemedine. So maybe we just stick w/ telemedicine?      [name],[name]  07/12/2018 2:44:00 PM > all set                     * * *                ---          * * *         Provider: [name] 07/12/2018    ---    Note generated by eClinicalWorks EMR/PM Software ([email or web address])

## 2018-07-12 NOTE — Progress Notes (Signed)
 .  Progress Notes  .  Patient: [name]  Provider: Orson Slick    .  DOB: 09-18-1960 Age: 58 Y Sex: Female  .  PCP: [name]  MD  Date: 07/12/2018  .  --------------------------------------------------------------------------------  .  REASON FOR APPOINTMENT  .  1. Rheumatoid arthritis/IVIg deficiency  .  HISTORY OF PRESENT ILLNESS  .  GENERAL:   Today Ms. Pai presents for follow up for IVIg deficiency and  rheumatoid arthritis. This is a Telehealth visit due to the  COVID-19 pandemic. The visit is taking place using audio and  video. The patient has consented to this format. The patient is  at home. The provider is in the clinic. Interval hx:--Taking  orencia weekly, feels no improvement at this point--Pain is  present in hands, back, shoulders, thighs. Pain all over, all of  the time. Pain is typically a 7-8/10 daily. Feels things continue  to get more difficult. She feels she is even having trouble  walking at this time. --She does not feel like any of the  biologics have helped at this time. Previously discussed infusoin  w/ Rituxan w/ [name], however, given COVID-19 pandemic,  decided to postpone initation of therapy. --Still on 20mg  of  prednisone, which is beneficial. If she does not take prednisone  she feels she cannot move her hands. --Over the past few weeks  she feels morning sitffness is worse, has trouble moving the  hands. About 1 hour of morning stiffness at this time. --She  denies any fevers, chills, frequent infections. No injection site  reactions although one episode of bruising. --Hands are swollen,  states the most pain is at the MCPs bilaterally. R wrist also  swollen and painful. Was doing needling with PT on the right  wrist, which she feels was really beneficial, but had to stop due  to COVID-19.--Continues to endorse that hands and feet are always  cold. Cannot get her feet warm. States through shoes she can feel  how cold her feet are. Feels that in the back 6 weeks her  feet  are so cold they feel numb. No sores on the feet or hands. They  do warm up in a hot shower or bath. States within 30 minutes or  so the temp changes again. Sleeping with an electric blanket.  .  CURRENT MEDICATIONS  .  Taking Baclofen 10 MG Tablet 1/2 tab Orally prn  Taking Calcium Carbonate-Vitamin D 600-200 MG-UNIT Capsule 1  capsule with a meal Orally Twice a day  Taking Famotidine 40 MG Tablet 1 tablet at bedtime Orally Once a  day  Taking Gammagard - Solution as directed Injection once every 4  weeks  Taking GlipiZIDE XL 5 MG Tablet Extended Release 24 Hour 1 tablet  Orally Once a day  Taking Glucophage XR 500 mg Tablet Extended Release 24 Hour 4  tablets Orally Once a day  Taking Hydroxychloroquine Sulfate 200 mg Tablet 1 tablet with  food or milk Orally Once a day  Taking Omeprazole 40 mg Capsule Delayed Release 1 capsule Orally  Once a day  Taking Orencia 250 mg Solution Reconstituted 1 ml Intravenous  once a week  Taking PredniSONE 10 MG Tablet TAKE 2 TABLETS BY MOUTH ONCE A DAY  Taking Rosuvastatin Calcium 10 MG Tablet 1 tablet Orally Once a  day  Taking Sulindac 150 mg Tablet 1 tablet with food Orally Twice a  day  Taking Tramadol HCl 50 MG Tablet 1 tablet Orally every 6  hrs  Taking Voltaren 1 % Gel 2 grams Transdermal twice daily  Discontinued Humira Pen 40 MG/0.8ML Pen-injector Kit 0.8 ml  Subcutaneous every 2 weeks  Discontinued Physical Therapy Evaluation and treatment of: 0 Neck  and back for spinal stenosis  Discontinued Ranitidine HCl 150 mg Capsule 1 capsule at bedtime  Orally Once a day  Medication List reviewed and reconciled with the patient  .  PAST MEDICAL HISTORY  .  Scleroderma - CREST dx 2007  IgG4 deficiency s/p IVIG 2010 ----single infusion given  preventively after week of bilateral knee replacements at [facility]  2010  Fx left wrist in 1994  Blood clot at LUE in 2006 on a short course of Coumadin  Bilateral carpal tunnel syndrome s/p Lt carpal tunnel release and  steroids injection  right--  Bone spur left foot  Scoliosis  Spinal stenosis s/p steroids injection  s/p bilateral knee replacements for valgus /arthritic  complications, performed by Dr. Katrinka Blazing 2010--- never infected, but  packed with antibiotics with surgery;  Right rotator cuff repairs x 4, complicated by repeat tears,  infection, placement of anchor material  Elevated liver function tests  Diabetes  .  ALLERGIES  .  N.K.D.A.  .  SURGICAL HISTORY  .  Right rotator cuff repair, with infected hardware that had to be  removed 2006  Repeat right shoulder surgery, also which became infected. 2007  Left rotator cuff repair 2008  bilateral knee replacements 2009  Left arthroscopic carpal tunnel release 01/2011  ORIF Left 4th metatarsal bone  Left Ulna shortening 1994  Knee replacement 2010  Hand surgery 2013, 2015  remove gallbladder/hernia 1990  Plate left foot 3rd metatarsal 2008  .  FAMILY HISTORY  .  Mother: deceased 69 yrs, lung cancer, hyperthyroidism, diagnosed  with Other malignant neoplasm of unspecified site  Father: deceased 28s yrs, heart attack, Unspecified heart disease  3 brother(s) .  FH of arthritis and scleroderma\nFather deceased from MI\nMother  deceased age 60 with lung cancer \\nBrother  with scleroderma \/  copd.  .  SOCIAL HISTORY  .  .  Tobacco  history: Never smoked.  [name]  Work/Occupation: Administrator at fitness center.  .  .  Alcohol  Former daily EtOH use in 20s  .  Nonsmoker.Lives with longstanding boyfriend.  [name]  HOSPITALIZATION/MAJOR DIAGNOSTIC PROCEDURE  .  as above  .  REVIEW OF SYSTEMS  .  Rheumatology:  .  General    No fever, weight loss, swollen glands . Muscoskeletal     +Joint pain, +joint swelling, +morning stiffness  . Eyes    No  vision loss, eye dry, red eye, eye pain . Mouth    No mouth sores  . Cardiovascular    ? raynaud, no chest pain , irregular heart  beat, racing heart beat, leg swelling . Pulmonary    No cough,  cough blood, shortness of breath . Skin    No rash,  photosensitivity .  Lymphatics    No swollen glands, tender glands  .  [name]  EXAMINATION  .  Rheumatology:  General Appearance Alert and oriented , No apparent distress .  [name]  HENT:cannot feel any enlarged glands in neck, able to swallow  without difficulty. No lumps noted on gross inspection, no oral  ulcers, oral mucosa appears moist, Voice quality is clear,  hearing intact.  .  Eyes: No, scleral icterus, scleral erythema .  [name]  Pulm:Normal, speaking in full sentences, not coughing, no audible  wheezing, does  not appear to be in distress..  .  Ext: no LE edema .  [name]  Skin: No appreciated rashes.  .  Psych: No anxiety depression.  .  MuskuloskeletalElbows, shoulders, hips, knees have intact ROM  with no synovitis. Hands have synovitis in the 2-5 PIPs,  nontender, and MCPs bilaterally. Heberdens node to R2 DIP,  bilateral 2nd MCPs painful. L4 PIP notably swollen w/ TTP. Able  to make a fist bilaterally. Pain expressed w/ flexion/extension  of right wrist and slight swelling over ulnar styloid. TTP over  right trochanteric bursa.  .  Skin and Nails Minimal sclerodactyly.  .  ASSESSMENTS  .  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  Type 2 diabetes mellitus with hyperglycemia, without long-term  current use of insulin - E11.65  .  Long-term use of high-risk medication - Z79.899  .  Other hyperlipidemia - E78.4  .  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  Synovitis of hand - M65.9  .  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  Onychomycosis - B35.1, Discussed difficulty in treating. Begin  OTC tx and monitor. Improvement as of Jan 2020 appt.  .  She has now been on Orencia for the past 4 months without any  notable improvement. Her hands continue to be painful w/ morning  stiffness, as well as back and neck pain, although she has known  hx of spinal stenosis. She feels she has not had any improvement  with biologics at this time and feels the most relief comes from  use of diclofenac gel and prednisone 20mg  daily. Since she has  no  additional relief, we will discontinue use of orencia, but hold  on initiation of another biologic. Ideally, we will begin her on  Rituxan, however, given the current pandemic w/ COVID-19 we will  hold off for now. She will continue plaquenil as prior. Labs are  completed w/ IVIg infusions. We will see her back in 1 month for  follow up to discuss Rituxan again, or sooner if needed should  she have worsening symptoms without Orencia. This plan was  discussed in great detail with Ms. Archuletta, to which she is  agreeable.PLAN:--STOP Orencia 250mg  every 4 weeks--Continue  prednisone 20mg  daily; DEXA completed 04/2018 Normal bone  mass--Continue use of diclofenac gel to BID--Continue famotidine  for reflux control; improvement noted--Unable to complete PT for  neck and back pain; will reconsider at future appt--Continue IVIg  infusions--Will discuss initation of Rituxan at follow upVisit  Length: 35 minutesGreater than 50% of this telemedicine appt was  spent on counseling as outlined above.  .  TREATMENT  .  Seronegative rheumatoid arthritis  Continue Hydroxychloroquine Sulfate Tablet, 200 mg, 1 tablet with  food or milk, Orally, Once a day  Stop Orencia Solution Reconstituted, 250 mg, 1 ml, Intravenous,  once a week  Continue PredniSONE Tablet, 10 MG, TAKE 2 TABLETS BY MOUTH ONCE A  DAY  Continue Gammagard Solution, -, as directed, Injection, once  every 4 weeks  Refill Voltaren Gel, 1 %, 2 grams, Transdermal, twice daily, 30  days, 1, Refills 11  .  FOLLOW UP  .  1 month w/ [name]  .  Electronically signed by Orson Slick , PA-C on  07/12/2018 at 03:23 PM EDT  .  Document electronically signed by Orson Slick    .

## 2018-07-12 NOTE — Progress Notes (Signed)
 * * *    Petras, Kaylynn **DOB:** [birth year] (58 yo F) **Acc No.** [id number] **DOS:**  07/12/2018    ---       [name], Charlsie**    ------    52 Y old Female, DOB: 06-15-1960, External MRN: [id number]    Account Number: [id number]    [address], [place], [id number]    Home: [phone]    Guarantor: [name] Insurance: [employer] [place]    PCP: [name], MD Referring: [name], MD    Appointment Facility: [facility]ology[facility]ology        * * *    07/12/2018 Progress Notes: Orson Slick, PA **CHN#:** [id number]    ------    ---       **Reason for Appointment**    ---      1\. Rheumatoid arthritis/IVIg deficiency    ---      **History of Present Illness**    ---     _GENERAL_ :    Today Ms. Wideman presents for follow up for IVIg deficiency and rheumatoid  arthritis.    This is a Telehealth visit due to the COVID-19 pandemic. The visit is taking  place using audio and video. The patient has consented to this format. The  patient is at home. The provider is in the clinic.    Interval hx:    --Taking orencia weekly, feels no improvement at this point    --Pain is present in hands, back, shoulders, thighs. Pain all over, all of  the time. Pain is typically a 7-8/10 daily. Feels things continue to get more  difficult. She feels she is even having trouble walking at this time.    --She does not feel like any of the biologics have helped at this time.  Previously discussed infusoin w/ Rituxan w/ [name], however, given  COVID-19 pandemic, decided to postpone initation of therapy.    --Still on 20mg  of prednisone, which is beneficial. If she does not take  prednisone she feels she cannot move her hands.    --Over the past few weeks she feels morning sitffness is worse, has trouble  moving the hands. About 1 hour of morning stiffness at this time.    --She denies any fevers, chills, frequent infections. No injection site  reactions although one episode of bruising.    --Hands are swollen, states the most pain  is at the MCPs bilaterally. R wrist  also swollen and painful. Was doing needling with PT on the right wrist, which  she feels was really beneficial, but had to stop due to COVID-19.    --Continues to endorse that hands and feet are always cold. Cannot get her  feet warm. States through shoes she can feel how cold her feet are. Feels that  in the back 6 weeks her feet are so cold they feel numb. No sores on the feet  or hands. They do warm up in a hot shower or bath. States within 30 minutes or  so the temp changes again. Sleeping with an electric blanket.      **Current Medications**    ---    Taking    * Baclofen 10 MG Tablet 1/2 tab Orally prn    ---    * Calcium Carbonate-Vitamin D 600-200 MG-UNIT Capsule 1 capsule with a meal Orally Twice a day    ---    * Famotidine 40 MG Tablet 1 tablet at bedtime Orally Once a day    ---    *  Gammagard - Solution as directed Injection once every 4 weeks    ---    * GlipiZIDE XL 5 MG Tablet Extended Release 24 Hour 1 tablet Orally Once a day    ---    * Glucophage XR 500 mg Tablet Extended Release 24 Hour 4 tablets Orally Once a day    ---    * Hydroxychloroquine Sulfate 200 mg Tablet 1 tablet with food or milk Orally Once a day    ---    * Omeprazole 40 mg Capsule Delayed Release 1 capsule Orally Once a day    ---    * Orencia 250 mg Solution Reconstituted 1 ml Intravenous once a week    ---    * PredniSONE 10 MG Tablet TAKE 2 TABLETS BY MOUTH ONCE A DAY     ---    * Rosuvastatin Calcium 10 MG Tablet 1 tablet Orally Once a day    ---    * Sulindac 150 mg Tablet 1 tablet with food Orally Twice a day    ---    * Tramadol HCl 50 MG Tablet 1 tablet Orally every 6 hrs    ---    * Voltaren 1 % Gel 2 grams Transdermal twice daily    ---    Discontinued    * Humira Pen 40 MG/0.8ML Pen-injector Kit 0.8 ml Subcutaneous every 2 weeks    ---    * Physical Therapy Evaluation and treatment of: 0 Neck and back for spinal stenosis     ---    * Ranitidine HCl 150 mg Capsule 1 capsule at  bedtime Orally Once a day    ---    * Medication List reviewed and reconciled with the patient    ---      **Past Medical History**    ---      Scleroderma - CREST dx 2007.        ---    IgG4 deficiency s/p IVIG 2010 ----single infusion given preventively after  week of bilateral knee replacements at [facility] 2010.        ---    Fx left wrist in 1994.        ---    Blood clot at LUE in 2006 on a short course of Coumadin.        ---    Bilateral carpal tunnel syndrome s/p Lt carpal tunnel release and steroids  injection right--.        ---    Bone spur left foot.        ---    Scoliosis.        ---    Spinal stenosis s/p steroids injection.        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 Katrinka Blazing 2010--- never infected, but packed with antibiotics with  surgery;.        ---    Right rotator cuff repairs x 4, complicated by repeat tears, infection,  placement of anchor material.        ---    Elevated liver function tests.        ---    Diabetes.        ---      **Surgical History**    ---      Right rotator cuff repair, with infected hardware that had to be removed  2006    ---    Repeat right shoulder surgery, also which  became infected. 2007    ---    Left rotator cuff repair 2008    ---    bilateral knee replacements 2009    ---    Left arthroscopic carpal tunnel release 01/2011    ---    ORIF Left 4th metatarsal bone    ---    Left Ulna shortening 1994    ---    Knee replacement 2010    ---    Hand surgery 2013, 2015    ---    remove gallbladder/hernia 1990    ---    Plate left foot 3rd metatarsal 2008    ---      **Family History**    ---      Mother: deceased 82 yrs, lung cancer, hyperthyroidism, diagnosed with Other  malignant neoplasm of unspecified site    ---    Father: deceased 8s yrs, heart attack, Unspecified heart disease    ---    3 brother(s) .    ---    FH of arthritis and scleroderma\nFather deceased from MI\nMother deceased age  49 with lung  cancer \\nBrother  with scleroderma \/ copd.    ---      **Social History**    ---    Tobacco history: Never smoked.    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    Alcohol    _Former daily EtOH use in 20s_  Nonsmoker.    Lives with longstanding boyfriend.    ---      **Allergies**    ---      N.K.D.A.    ---      **Hospitalization/Major Diagnostic Procedure**    ---      as above    ---     **Review of Systems**    ---     _Rheumatology_ :    General No fever, weight loss, swollen glands. Muscoskeletal +Joint pain,  +joint swelling, +morning stiffness . Eyes No vision loss, eye dry, red eye,  eye pain. Mouth No mouth sores. Cardiovascular ? raynaud, no chest pain ,  irregular heart beat, racing heart beat, leg swelling. Pulmonary No cough,  cough blood, shortness of breath. Skin No rash, photosensitivity. Lymphatics  No swollen glands, tender glands.         **Examination**    ---     _Rheumatology:_    General Appearance  Alert and oriented , No apparent distress .    HENT: cannot feel any enlarged glands in neck, able to swallow without  difficulty. No lumps noted on gross inspection, no oral ulcers, oral mucosa  appears moist, Voice quality is clear, hearing intact.    Eyes:  No, scleral icterus, scleral erythema .    Pulm: Normal, speaking in full sentences, not coughing, no audible wheezing,  does not appear to be in distress..    Ext:  no LE edema .    Skin:  No appreciated rashes.    Psych:  No anxiety depression.    Muskuloskeletal Elbows, shoulders, hips, knees have intact ROM with no  synovitis. Hands have synovitis in the 2-5 PIPs, nontender, and MCPs  bilaterally. Heberdens node to R2 DIP, bilateral 2nd MCPs painful. L4 PIP  notably swollen w/ TTP. Able to make a fist bilaterally. Pain expressed w/  flexion/extension of right wrist and slight swelling over ulnar styloid. TTP  over right trochanteric bursa.    Skin and Nails  Minimal sclerodactyly.         **Assessments**    ---  1\.  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 Type 2 diabetes mellitus with hyperglycemia, without long-term current use  of insulin - E11.65    ---    3\. Long-term use of high-risk medication - Z79.899    ---    4\. Other hyperlipidemia - E78.4    ---    5\. Hammertoe of second toe of left foot - M20.42    ---    6\. Synovitis of hand - M65.9    ---    7\. 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 Onychomycosis - B35.1, Discussed difficulty in treating. Begin OTC tx and  monitor. Improvement as of Jan 2020 appt.    ---     She has now been on Orencia for the past 4 months without any notable  improvement. Her hands continue to be painful w/ morning stiffness, as well as  back and neck pain, although she has known hx of spinal stenosis. She feels  she has not had any improvement with biologics at this time and feels the most  relief comes from use of diclofenac gel and prednisone 20mg  daily. Since she  has no additional relief, we will discontinue use of orencia, but hold on  initiation of another biologic. Ideally, we will begin her on Rituxan,  however, given the current pandemic w/ COVID-19 we will hold off for now. She  will continue plaquenil as prior. Labs are completed w/ IVIg infusions. We  will see her back in 1 month for follow up to discuss Rituxan again, or sooner  if needed should she have worsening symptoms without Orencia. This plan was  discussed in great detail with Ms. Solorzano, to which she is agreeable.    PLAN:    --STOP Orencia 250mg  every 4 weeks    --Continue prednisone 20mg  daily; DEXA completed 04/2018 Normal bone mass    --Continue use of diclofenac gel to BID    --Continue famotidine for reflux control; improvement noted    --Unable to complete PT for neck and back pain; will reconsider at future  appt    --Continue IVIg infusions    --Will discuss initation of Rituxan at follow up    Visit Length: 35 minutes    Greater than 50% of this telemedicine appt was spent on  counseling as outlined  above.    ---      **Treatment**    ---      **1\. Seronegative rheumatoid arthritis**    Continue Hydroxychloroquine Sulfate Tablet, 200 mg, 1 tablet with food or  milk, Orally, Once a day    Stop Orencia Solution Reconstituted, 250 mg, 1 ml, Intravenous, once a week    Continue PredniSONE Tablet, 10 MG, TAKE 2 TABLETS BY MOUTH ONCE A DAY    Continue Gammagard Solution, -, as directed, Injection, once every 4 weeks    Refill Voltaren Gel, 1 %, 2 grams, Transdermal, twice daily, 30 days, 1,  Refills 11    ---      **Follow Up**    ---    1 month w/ [name]    Electronically signed by Orson Slick , PA-C on 07/12/2018 at 03:23 PM EDT    Sign off status: Completed        * * *        Rheumatology, Allergy and Immunology    [address]    [facility], [address] Floor  [place], [place] [address]    Tel: [phone]    Fax: [phone]              * * *          Progress Note: Orson Slick, PA 07/12/2018    ---    Note generated by eClinicalWorks EMR/PM Software ([email or web address])

## 2018-07-22 ENCOUNTER — Ambulatory Visit

## 2018-07-29 ENCOUNTER — Ambulatory Visit

## 2018-07-29 NOTE — Telephone Encounter (Signed)
 RESPONSE/ORDERS:  [name] Heron, do you have a copy of the FMLA paperwork I filled out from 6 months ago? It was on 02/05/2018. I believe you scanned it into [employer] but I'm not able to print a copy. Do you mind printing a copy for me, if you are able? thank you very much .......................................[name] Bene, MD  July 29, 2018 8:37 AM    I'm working from home this week.  [name], can you please print it from EMR and put in Dr. Barbaraann Barthel box on 4A?  Thanks  ......................................[name]  July 29, 2018 8:48 AM    Spoke to [place]; she was unable to find the scanned form in [place]. I will check again as well. .......................................[name] Bene, MD  July 29, 2018 4:51 PM    it is under Progress Notes from 02/14/18.   ......................................[name]  July 30, 2018 8:18 AM    Ewa im still looking through [employer] on my end to try to find this document i cant seem to locate it. Were you able to print it out when you were here? i tried getting help from emily she said my system might not be mapped to find it. If not lmk ill try again .......................................[name]  Aug 02, 2018 9:40 AM    [name], can you see it in [employer]arian? do you still need me to print it?   I was in the office yesterday but didn't have this note on my desktop. It is under Progress Notes from 02/14/18......................................[name]  Aug 02, 2018 11:44 AM    I found it in my mailbox. Not sure if you or [name] printed it for me, but thank you both very much! 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)  DIABETES MELLITUS, TYPE II (ICD-250.00) (ICD10-E11.9)  LONG-TERM (CURRENT) USE OF STEROIDS (ICD-V58.65) (ICD10-Z79.51)  SKIN SAGGING DUE TO WEIGHT LOSS (ICD-757.9) (ICD10-Q84.9)  TRANSAMINASES, SERUM, ELEVATED (ICD-790.4) (ICD10-R74.0)  OBESITY, BMI 30-34.9, ADULT (ICD-278.00) (ICD10-E66.9)      DIABETES MELLITUS, TYPE II, UNCONTROLLED (ICD-250.02) (ICD10-E11.65)      FATTY LIVER DISEASE (ICD-571.8) (ICD10-K76.0)  SCLERODERMA, 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)  HYPOTHYROIDISM (ICD-244.9) (ICD10-E03.9)  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) (ICD10-D80.8)  SPINAL STENOSIS, 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)  HEPATITIS C EXPOSURE (HCV RNA NEGATIVE, 01/2014) (ICD-V02.62) (ICD10-Z20.5)  PAIN IN JOINT, HAND (ICD-719.44) (ICD10-M79.643)  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)  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)  CARPAL TUNNEL (ICD-354.0) (ICD10-G56.00)  OSTEOARTHRITIS (ICD-715.09) (ICD10-M15.9)  S/P ROTATOR CUFF SURGERY 12/2004 - RT SHOULDER; 2008 LT (ICD-V45.89)  Family Hx of MELANOMA, FAMILY HX (ICD-V16.8) (ICD10-Z80.8)  FRACTURE OF OTHER SPEC SITE,  PATHOLOGIC - MULTIPLE (ICD-733.19)  CHOLECYSTECTOMY AND HERNIA REPAIR (ICD-V45.89)  VITAMIN D DEFICIENCY (ICD-268.9) (ICD10-E55.9)  COLONOSCOPY, NEXT 2020- SEE COMMENT (ICD-V76.51) (ICD10-Z01.89)    Meds (prior to this call):   HYDROXYCHLOROQUINE SULFATE 200 MG ORAL TABLET (HYDROXYCHLOROQUINE SULFATE) one tablet oral daily; Route: ORAL  ORENCIA 125 MG/ML SUBCUTANEOUS SOLUTION PREFILLED SYRINGE (ABATACEPT) inject once a week; Route: SUBCUTANEOUS  JARDIANCE 10 MG ORAL TABLET (EMPAGLIFLOZIN) Take 1 tablet by mouth once daily; Route: ORAL  ROSUVASTATIN 10MG  TABLETS (ROSUVASTATIN CALCIUM) TAKE  1 TABLET BY MOUTH EVERY DAY  LISINOPRIL 2.5MG  TABLETS (LISINOPRIL) TAKE 1 TABLET BY MOUTH ONCE DAILY  METFORMIN HCL ER 500 MG XR24H-TAB (METFORMIN HCL) TAKE 4 TABLETS BY MOUTH EVERY  MORNING  GLIPIZIDE ER 2.5 MG XR24H-TAB (GLIPIZIDE) TAKE 1 TABLET BY MOUTH DAILY  PREDNISONE 10 MG ORAL TABLET (PREDNISONE) Take 1 tablet by mouth once daily; Route: ORAL  OMEPRAZOLE 20 MG ORAL CAPSULE DELAYED RELEASE (OMEPRAZOLE) Take one capsule by mouth twice a day; Route: ORAL  IBUPROFEN 600 MG ORAL TABLET (IBUPROFEN) 1 tab by mouth TID as needed for pain  BACLOFEN 10MG  TABLETS (BACLOFEN) TAKE 1/2 TABLET BY MOUTH EVERY DAY AS NEEDED FOR BACK OR SPASMS  DICLOFENAC SODIUM 1 % TRANSDERMAL GEL (DICLOFENAC SODIUM) APP 2 GRAMS EXT AA BID      FREESTYLE FREEDOM LITE w/Device KIT (BLOOD GLUCOSE MONITORING SUPPL) use as directed (ICD 10 E11.65)      FREESTYLE LITE BLOOD GLUCOSE STRIPS (GLUCOSE BLOOD) USE TO CHECK BLOOD GLUCOSE THREE TIMES DAILY      FREESTYLE LANCETS (LANCETS) check fingerstick three times a day (ICD 10 E11.65)  TRAMADOL HCL 50 MG ORAL TABLET (TRAMADOL HCL) take one tablet daily as needed for pain; Route: ORAL  OXYCODONE HCL 5 MG ORAL TABLET (OXYCODONE HCL) Partial fill upon patient request.  Take one tablet daily as needed for pain >8; Route: ORAL  CALCIUM CARBONATE-VITAMIN D 600-200 MG-UNIT ORAL TABLET (CALCIUM CARBONATE-VITAMIN D) take one tablet twice a day; Route: ORAL  GABAPENTIN 300MG  CAPSULES (GABAPENTIN) TAKE 1 CAPSULE BY MOUTH EVERY NIGHT AT BEDTIME AS NEEDED FOR PAIN  FAMOTIDINE 20 MG ORAL TABLET (FAMOTIDINE) ; Route: ORAL            Created By [name] Bene, MD on 07/29/2018 at 08:36 AM    Electronically Signed By [name] Bene, MD on 08/05/2018 at 10:47 AM

## 2018-08-06 ENCOUNTER — Ambulatory Visit

## 2018-08-06 NOTE — Progress Notes (Signed)
 [facility] Aug 06, 2018  [address]   [place]  [place] [address]  Main: [phone]  Fax: [phone]  Patient Portal: [email or web address]            Vallery Heinsohn  [address]  [place], [place]  [address]      Dear  [name],    I am writing this letter to inform you that I will be leaving my practice at [facility] on November 01, 2018, to pursue other career opportunities.  It has been an honor to be your primary care provider.    Your care at [facility] will continue with one of our many excellent physicians.  I am in the process of reviewing all of my patients, and making specific suggestions on your new PCP.  I?ve known you personally as your care provider, so I feel I am best suited to make this referral.  If you would like to make plans on your own to move your care to another of our providers, please note that many are accepting new patients, including:         Dr. Yevette Edwards            Dr. Lindwood Qua       Dr. Para March       Dr. Lezlie Lye    It is important to establish care with one of these physicians, so that we can continue to provide for all of your healthcare needs. You may call [phone] to arrange an appointment with one of these physicians or other available physicians at [facility].   In July, my office will send you a follow-up letter with the name of your new doctor.    If you have any type of HMO insurance, please be sure to let them know which doctor you are choosing as your new primary care doctor.    Thank you for allowing me the opportunity to take care of you during my time at [facility]. I wish you good health and happiness in the future.    Sincerely,    [name], MD      Created By [name] Held on 08/06/2018 at 08:48 AM    Electronically Signed By [name] Held on 08/06/2018 at 08:48 AM

## 2018-08-06 NOTE — Telephone Encounter (Signed)
 Call Details:   Patient PCP = [name], MD  [name] (Patient) called on Aug 06, 2018 12:34 PM.  Message taken by: Milinda Hirschfeld  Primary call-back number: [phone]    Secondary call-back number: () -    Call Reason(s): Message/Call-Back      ** MESSAGE / CALL-BACK.  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) (ICD10-M47.812)  NECK PAIN (ICD-723.1) (ICD10-M54.2)  STEROID USE, LONG TERM (ICD-V58.65) (ICD10-Z79.52)  PREMATURE VENTRICULAR CONTRACTIONS (ICD-427.69) (ICD10-I49.3)  HYPERLIPIDEMIA (ICD-272.4) (ICD10-E78.5)  BACK PAIN, LUMBAR, CHRONIC (ICD-724.2) (ICD10-M54.5)  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)  DIABETES MELLITUS, TYPE II (ICD-250.00) (ICD10-E11.9)  LONG-TERM (CURRENT) USE OF STEROIDS (ICD-V58.65) (ICD10-Z79.51)  SKIN SAGGING DUE TO WEIGHT LOSS (ICD-757.9) (ICD10-Q84.9)  TRANSAMINASES, SERUM, ELEVATED (ICD-790.4) (ICD10-R74.0)  OBESITY, BMI 30-34.9, ADULT (ICD-278.00) (ICD10-E66.9)      DIABETES MELLITUS, TYPE II, UNCONTROLLED (ICD-250.02) (ICD10-E11.65)      FATTY LIVER DISEASE (ICD-571.8) (ICD10-K76.0)  SCLERODERMA, 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)  HYPOTHYROIDISM (ICD-244.9) (ICD10-E03.9)  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) (ICD10-D80.8)  SPINAL STENOSIS, 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)  HEPATITIS C EXPOSURE (HCV RNA NEGATIVE, 01/2014) (ICD-V02.62) (ICD10-Z20.5)  PAIN IN JOINT, HAND (ICD-719.44) (ICD10-M79.643)  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)  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)  CARPAL TUNNEL (ICD-354.0) (ICD10-G56.00)  OSTEOARTHRITIS (ICD-715.09) (ICD10-M15.9)  S/P ROTATOR CUFF SURGERY 12/2004 - RT  SHOULDER; 2008 LT (ICD-V45.89)  Family Hx of MELANOMA, FAMILY HX (ICD-V16.8) (ICD10-Z80.8)  FRACTURE OF OTHER SPEC SITE,  PATHOLOGIC - MULTIPLE (ICD-733.19)  CHOLECYSTECTOMY AND HERNIA REPAIR (ICD-V45.89)  VITAMIN D DEFICIENCY (ICD-268.9) (ICD10-E55.9)  COLONOSCOPY, NEXT 2020- SEE COMMENT (ICD-V76.51) (ICD10-Z01.89)    Meds (prior to this call):   HYDROXYCHLOROQUINE SULFATE 200 MG ORAL TABLET (HYDROXYCHLOROQUINE SULFATE) one tablet oral daily; Route: ORAL  ORENCIA 125 MG/ML SUBCUTANEOUS SOLUTION PREFILLED SYRINGE (ABATACEPT) inject once a week; Route: SUBCUTANEOUS  JARDIANCE 10 MG ORAL TABLET (EMPAGLIFLOZIN) Take 1 tablet by mouth once daily; Route: ORAL  ROSUVASTATIN 10MG  TABLETS (ROSUVASTATIN CALCIUM) TAKE 1 TABLET BY MOUTH EVERY DAY  LISINOPRIL 2.5MG  TABLETS (LISINOPRIL) TAKE 1 TABLET BY MOUTH ONCE DAILY  METFORMIN HCL ER 500 MG XR24H-TAB (METFORMIN HCL) TAKE 4 TABLETS BY MOUTH EVERY MORNING  GLIPIZIDE ER 2.5 MG XR24H-TAB (GLIPIZIDE) TAKE 1 TABLET BY MOUTH DAILY  PREDNISONE 10 MG ORAL TABLET (PREDNISONE) Take 1 tablet by mouth once daily; Route: ORAL  OMEPRAZOLE 20 MG ORAL CAPSULE DELAYED RELEASE (OMEPRAZOLE) Take one capsule by mouth twice a day; Route: ORAL  IBUPROFEN 600 MG ORAL TABLET (IBUPROFEN) 1 tab by mouth TID as needed for pain  BACLOFEN 10MG  TABLETS (BACLOFEN) TAKE 1/2 TABLET BY MOUTH EVERY DAY AS NEEDED FOR BACK OR SPASMS  DICLOFENAC SODIUM 1 % TRANSDERMAL GEL (DICLOFENAC SODIUM) APP 2 GRAMS EXT AA BID      FREESTYLE FREEDOM LITE w/Device KIT (BLOOD GLUCOSE MONITORING SUPPL) use as directed (ICD 10 E11.65)      FREESTYLE LITE BLOOD GLUCOSE STRIPS (GLUCOSE BLOOD) USE TO CHECK BLOOD GLUCOSE THREE TIMES DAILY      FREESTYLE LANCETS (LANCETS) check fingerstick three times a day (ICD 10 E11.65)  TRAMADOL HCL 50 MG ORAL TABLET (TRAMADOL HCL) take one tablet daily as needed for pain; Route: ORAL  OXYCODONE HCL 5 MG ORAL TABLET (OXYCODONE HCL) Partial fill upon patient request.  Take one tablet daily as  needed for pain >8; Route: ORAL  CALCIUM CARBONATE-VITAMIN D 600-200 MG-UNIT ORAL TABLET (CALCIUM CARBONATE-VITAMIN D) take one tablet twice a day; Route: ORAL  GABAPENTIN 300MG  CAPSULES (GABAPENTIN) TAKE 1 CAPSULE BY MOUTH EVERY NIGHT AT BEDTIME AS NEEDED FOR PAIN  FAMOTIDINE 20 MG ORAL TABLET (FAMOTIDINE) ; Route: ORAL            Created By Milinda Hirschfeld on 08/06/2018 at 12:34 PM    Electronically Signed By Everlene Farrier on 08/06/2018 at 02:14 PM

## 2018-08-07 ENCOUNTER — Ambulatory Visit

## 2018-08-07 NOTE — Telephone Encounter (Signed)
 Call Details:   Patient PCP = [name], MD  [name] (Patient) called on Aug 07, 2018 11:10 AM.  Message taken by: Darleene Cleaver  Primary call-back number: [phone]    Secondary call-back number: () -    Call Reason(s): Message/Call-Back      ** MESSAGE / CALL-BACK.  Regarding: Pt is having issues regarding setup for [facility]. Pt would like a call back from [place]. Thanks    ---------- ---------- ---------- ---------- ---------- ----------       RESPONSE/ORDERS:    I resent the appointment confirmation and lvm for pt to check online connect again......................................Marland KitchenEwa Chomicki  Aug 07, 2018 1:00 PM    I spoke to pt, she still couldn't log in to online connect.  I cancelled her appointment and scheduled it again. 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)  DIABETES MELLITUS, TYPE II (ICD-250.00) (ICD10-E11.9)  LONG-TERM (CURRENT) USE OF STEROIDS (ICD-V58.65) (ICD10-Z79.51)  SKIN SAGGING DUE TO WEIGHT LOSS (ICD-757.9) (ICD10-Q84.9)  TRANSAMINASES, SERUM, ELEVATED (ICD-790.4) (ICD10-R74.0)  OBESITY, BMI 30-34.9, ADULT (ICD-278.00) (ICD10-E66.9)      DIABETES MELLITUS, TYPE II, UNCONTROLLED (ICD-250.02) (ICD10-E11.65)      FATTY LIVER DISEASE (ICD-571.8) (ICD10-K76.0)  SCLERODERMA, 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)  HYPOTHYROIDISM (ICD-244.9) (ICD10-E03.9)  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) (ICD10-D80.8)  SPINAL STENOSIS, 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)  HEPATITIS C EXPOSURE (HCV  RNA NEGATIVE, 01/2014) (ICD-V02.62) (ICD10-Z20.5)  PAIN IN JOINT, HAND (ICD-719.44) (ICD10-M79.643)  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)  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)  CARPAL TUNNEL (ICD-354.0) (ICD10-G56.00)  OSTEOARTHRITIS (ICD-715.09) (ICD10-M15.9)  S/P ROTATOR CUFF SURGERY 12/2004 - RT SHOULDER; 2008 LT (ICD-V45.89)  Family Hx of MELANOMA, FAMILY HX (ICD-V16.8) (ICD10-Z80.8)  FRACTURE OF OTHER SPEC SITE,  PATHOLOGIC - MULTIPLE (ICD-733.19)  CHOLECYSTECTOMY AND HERNIA REPAIR (ICD-V45.89)  VITAMIN D DEFICIENCY (ICD-268.9) (ICD10-E55.9)  COLONOSCOPY, NEXT 2020- SEE COMMENT (ICD-V76.51) (ICD10-Z01.89)    Meds (prior to this call):   HYDROXYCHLOROQUINE SULFATE 200 MG ORAL TABLET (HYDROXYCHLOROQUINE SULFATE) one tablet oral daily; Route: ORAL  ORENCIA 125 MG/ML SUBCUTANEOUS SOLUTION PREFILLED SYRINGE (ABATACEPT) inject once a week; Route: SUBCUTANEOUS  JARDIANCE 10 MG ORAL TABLET (EMPAGLIFLOZIN) Take 1 tablet by mouth once daily; Route: ORAL  ROSUVASTATIN 10MG  TABLETS (ROSUVASTATIN CALCIUM) TAKE 1 TABLET BY MOUTH EVERY DAY  LISINOPRIL 2.5MG  TABLETS (LISINOPRIL) TAKE 1 TABLET BY MOUTH ONCE DAILY  METFORMIN HCL ER 500 MG XR24H-TAB (METFORMIN HCL) TAKE 4 TABLETS BY MOUTH EVERY MORNING  GLIPIZIDE ER 2.5 MG XR24H-TAB (GLIPIZIDE) TAKE 1 TABLET BY MOUTH DAILY  PREDNISONE 10 MG ORAL TABLET (PREDNISONE) Take 1 tablet by mouth once daily; Route: ORAL  OMEPRAZOLE 20 MG ORAL CAPSULE DELAYED RELEASE (OMEPRAZOLE) Take one capsule by mouth twice a day; Route: ORAL  IBUPROFEN 600 MG ORAL TABLET (IBUPROFEN) 1 tab by mouth TID as needed for pain  BACLOFEN 10MG  TABLETS (BACLOFEN) TAKE 1/2 TABLET BY MOUTH EVERY DAY AS NEEDED FOR BACK OR SPASMS  DICLOFENAC SODIUM 1 % TRANSDERMAL GEL (DICLOFENAC SODIUM) APP 2 GRAMS EXT AA BID      FREESTYLE FREEDOM LITE  w/Device KIT (BLOOD GLUCOSE MONITORING SUPPL) use as directed (ICD 10 E11.65)      FREESTYLE LITE BLOOD GLUCOSE STRIPS (GLUCOSE BLOOD) USE TO CHECK BLOOD  GLUCOSE THREE TIMES DAILY      FREESTYLE LANCETS (LANCETS) check fingerstick three times a day (ICD 10 E11.65)  TRAMADOL HCL 50 MG ORAL TABLET (TRAMADOL HCL) take one tablet daily as needed for pain; Route: ORAL  OXYCODONE HCL 5 MG ORAL TABLET (OXYCODONE HCL) Partial fill upon patient request.  Take one tablet daily as needed for pain >8; Route: ORAL  CALCIUM CARBONATE-VITAMIN D 600-200 MG-UNIT ORAL TABLET (CALCIUM CARBONATE-VITAMIN D) take one tablet twice a day; Route: ORAL  GABAPENTIN 300MG  CAPSULES (GABAPENTIN) TAKE 1 CAPSULE BY MOUTH EVERY NIGHT AT BEDTIME AS NEEDED FOR PAIN  FAMOTIDINE 20 MG ORAL TABLET (FAMOTIDINE) ; Route: ORAL            Created By Darleene Cleaver on 08/07/2018 at 11:10 AM    Electronically Signed By Everlene Farrier on 08/07/2018 at 02:56 PM

## 2018-08-08 ENCOUNTER — Ambulatory Visit

## 2018-08-09 ENCOUNTER — Ambulatory Visit: Admitting: Internal Medicine

## 2018-08-09 ENCOUNTER — Ambulatory Visit: Admitting: Rheumatology

## 2018-08-09 ENCOUNTER — Ambulatory Visit

## 2018-08-09 ENCOUNTER — Ambulatory Visit: Admit: 2018-08-09 | Payer: HMO

## 2018-08-09 MED ORDER — Hydroxychloroquine Sulfate: 200 | Tablet | Freq: Every day | 3 refills | 0 days | Status: AC

## 2018-08-09 NOTE — Progress Notes (Signed)
* * *      [name], [name] **DOB:** [birth year] (58 yo F) **Acc No.** [id number] **DOS:**  08/09/2018    ---       [name], [name]**    ------    44 Y old Female, DOB: 08/17/1960    [address] [place], [place] [address]    Home: [phone]    Provider: [name]        * * *    Telephone Encounter    ---    Answered by  Clyde Lundborg Date: 08/09/2018       Time: 11:37 AM    Caller  patient    ------            Reason  AmWell issues            Message                     Good afternoon,            Patient called to confirm if upcoming 5/22 appt was in person or virtual; let her know it will be TL however she would like to have AmWell set up. Apparently she's been having issues with the platform and spoke with IT dept this morning, which was unhelpful. They were unable to assist her with a password issue, she's hoping we can figure it out/resolve her amwell profile in time for her 5/22 appt. Thanks!                Action Taken                     [employer]  08/09/2018 11:38:27 AM >      [name],[name]  08/09/2018 11:50:30 AM > converted to TL nad sent amwell link                    * * *                ---          * * *         Provider: [name] 08/09/2018    ---    Note generated by e[employer]Works EMR/PM Software ([email or web address])

## 2018-08-09 NOTE — Telephone Encounter (Signed)
 Call Details:   Patient PCP = [name], MD  [name] (Patient) called on Aug 09, 2018 8:01 AM.  Message taken by: Christain Sacramento  Primary call-back number: [phone]    Secondary call-back number: () -    Call Reason(s): Message/Call-Back      ** MESSAGE / CALL-BACK.  Regarding: pt have televisit appointment this morning, pt stated can she have it by phone .[name]    ---------- ---------- ---------- ---------- ---------- ----------       RESPONSE/ORDERS:  Pt called stating she is having issues with Amwell setup and wanted to know if Dr can do just a reg call. Pt has appt today. Pt would like a call back. Thanks  ......................................Marland KitchenMarland KitchenAntinika Morris  Aug 09, 2018 8:09 AM    Pt called again.  ......................................Marland KitchenVirgina Evener  Aug 09, 2018 8:23 AM    Dr. Marlane Hatcher, please call her. I spoke to her on Wednesday, sent her another link to the appt but she still couldn't register  ......................................Marland KitchenEwa Chomicki  Aug 09, 2018 8:43 AM  Molli Knock, will do phone televisit. 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)  DIABETES MELLITUS, TYPE II (ICD-250.00) (ICD10-E11.9)  LONG-TERM (CURRENT) USE OF STEROIDS (ICD-V58.65) (ICD10-Z79.51)  SKIN SAGGING DUE TO WEIGHT LOSS (ICD-757.9) (ICD10-Q84.9)  TRANSAMINASES, SERUM, ELEVATED (ICD-790.4) (ICD10-R74.0)  OBESITY, BMI 30-34.9, ADULT (ICD-278.00) (ICD10-E66.9)      DIABETES MELLITUS, TYPE II, UNCONTROLLED (ICD-250.02) (ICD10-E11.65)      FATTY LIVER DISEASE (ICD-571.8)  (ICD10-K76.0)  SCLERODERMA, 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)  HYPOTHYROIDISM (ICD-244.9) (ICD10-E03.9)  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) (ICD10-D80.8)  SPINAL STENOSIS, 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)  HEPATITIS C EXPOSURE (HCV RNA NEGATIVE, 01/2014) (ICD-V02.62) (ICD10-Z20.5)  PAIN IN JOINT, HAND (ICD-719.44) (ICD10-M79.643)  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)  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)  CARPAL TUNNEL (ICD-354.0) (ICD10-G56.00)  OSTEOARTHRITIS (ICD-715.09) (ICD10-M15.9)  S/P ROTATOR CUFF SURGERY 12/2004 - RT SHOULDER; 2008 LT (ICD-V45.89)  Family Hx of MELANOMA, FAMILY HX (ICD-V16.8) (ICD10-Z80.8)  FRACTURE OF OTHER SPEC SITE,  PATHOLOGIC - MULTIPLE (ICD-733.19)  CHOLECYSTECTOMY AND HERNIA REPAIR (ICD-V45.89)  VITAMIN D DEFICIENCY (ICD-268.9) (ICD10-E55.9)  COLONOSCOPY, NEXT 2020- SEE COMMENT (ICD-V76.51) (ICD10-Z01.89)    Meds (prior to this call):   HYDROXYCHLOROQUINE SULFATE 200 MG ORAL TABLET (HYDROXYCHLOROQUINE SULFATE) one tablet oral daily; Route: ORAL  ORENCIA 125 MG/ML SUBCUTANEOUS SOLUTION PREFILLED SYRINGE (ABATACEPT) inject once a week; Route: SUBCUTANEOUS  JARDIANCE 10 MG ORAL TABLET (EMPAGLIFLOZIN) Take 1 tablet by mouth once daily; Route: ORAL  ROSUVASTATIN 10MG  TABLETS (ROSUVASTATIN CALCIUM) TAKE 1 TABLET BY MOUTH EVERY DAY  LISINOPRIL 2.5MG  TABLETS (LISINOPRIL) TAKE 1 TABLET BY MOUTH ONCE DAILY  METFORMIN HCL ER 500 MG XR24H-TAB (METFORMIN HCL) TAKE 4 TABLETS BY MOUTH EVERY MORNING  GLIPIZIDE ER 2.5 MG XR24H-TAB (GLIPIZIDE) TAKE 1 TABLET BY MOUTH DAILY  PREDNISONE 10 MG ORAL TABLET (PREDNISONE) Take 1 tablet by mouth once daily; Route: ORAL  OMEPRAZOLE 20 MG ORAL CAPSULE DELAYED RELEASE (OMEPRAZOLE) Take one capsule by mouth twice a day; Route: ORAL  IBUPROFEN 600 MG ORAL TABLET (IBUPROFEN) 1 tab by mouth TID as needed for pain  BACLOFEN 10MG  TABLETS (BACLOFEN) TAKE 1/2 TABLET BY MOUTH EVERY DAY AS NEEDED FOR BACK OR SPASMS  DICLOFENAC  SODIUM 1 % TRANSDERMAL GEL (DICLOFENAC SODIUM) APP 2 GRAMS EXT AA BID      FREESTYLE FREEDOM LITE w/Device KIT (BLOOD GLUCOSE MONITORING SUPPL) use as directed (ICD 10 E11.65)      FREESTYLE LITE BLOOD GLUCOSE STRIPS (GLUCOSE BLOOD) USE TO CHECK BLOOD GLUCOSE THREE TIMES DAILY      FREESTYLE LANCETS (LANCETS) check fingerstick three times a day (ICD 10 E11.65)  TRAMADOL HCL 50 MG ORAL TABLET (TRAMADOL HCL) take one tablet daily as needed for pain; Route: ORAL  OXYCODONE HCL 5 MG ORAL TABLET (OXYCODONE HCL) Partial fill upon patient request.  Take one tablet daily as needed for pain >8; Route: ORAL  CALCIUM CARBONATE-VITAMIN D 600-200 MG-UNIT ORAL TABLET (CALCIUM CARBONATE-VITAMIN D) take one tablet twice a day; Route: ORAL  GABAPENTIN 300MG  CAPSULES (GABAPENTIN) TAKE 1 CAPSULE BY MOUTH EVERY NIGHT AT BEDTIME AS NEEDED FOR PAIN  FAMOTIDINE 20 MG ORAL TABLET (FAMOTIDINE) ; Route: ORAL            Created By Christain Sacramento on 08/09/2018 at 08:01 AM    Electronically Signed By [name], MD on 08/09/2018 at 08:55 AM

## 2018-08-09 NOTE — Progress Notes (Signed)
* * *      [name], [name] **DOB:** 06-09-1960 (58 yo F) **Acc No.** [id number] **DOS:**  08/09/2018    ---       [name], [name]**    ------    70 Y old Female, DOB: Oct 05, 1960    [address], [place], [place] [address]    Home: [phone]    Provider: [name]        * * *    Telephone Encounter    ---    Answered by  [name] Date: 08/09/2018       Time: 02:26 PM    Reason  refill    ------            Action Taken                     [name]  08/09/2018 2:28:48 PM > refill sent                Refills Continue Hydroxychloroquine Sulfate Tablet, 200 mg, Orally, 30  Tablet, 1 tablet with food or milk, Once a day, 30 days, Refills=3    ------          * * *                ---          * * *         Provider: [name] 08/09/2018    ---    Note generated by [employer] EMR/PM Software ([email or web address])

## 2018-08-09 NOTE — Progress Notes (Signed)
 General Medicine Tele-Medicine Visit  This visit is done remotely with myself andthis patient.  Patient presents during the COVID-19 pandemic / federally declared state of   public health emergency.  Visit Type: Audio Visit Only  Patient consent for audio or video type visit:  Yes  Patient does not have access to video visit:  Yes  Chief Complaint:   intermittent GI illnesses  .  History of Present Illness:   yesterday and tuesday temperature was 99.1.  Been feeling sick in the stomach a month ago thought she got food poisoning   from a meat. After 4 days of throwing up felt better for about 5 days then   got bad diarrhea for about 4 days, keep feeling better then getting sick ,   nauseous and diarrhea .  This is not typical for her. She has ongoing several days of nausea, diarrh  ea, and then feels better, and then it starts up again for a few days. This   has been going on for a month. Feels like she is "burning up" whenever thi  s happens. Not associated with any foods she's noticed. Tried cutting out d  airy but it [employer]. She has been trying to eat soups, toast etc. Eat  ing solid foods has made her have symptoms again.   .  .  .  .  .  Family History: (reviewed)   Father: DMII, died of MI at 34  Mom: died of lung CA at 6  MGM and PGM: lung ca (smokers)  MGF and several other second degree relatives: brain aneurysms in their 60'  s.  2 maternal uncles:melanoma (one died of unknown cancer, one living)  brother recently diagnosed with systemic scleroderma  .  Social History: (reviewed)   Non-smoker, rare etoh, no IVDU. 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 Living together with her lifelong partner of /  R/20 years - may be getting legally married to him soon. Sexually active wi  th husband only. No children. Always wears a seat belt.  .  .  Please refer to HPI for ROS  .  .  .  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)  DIABETES MELLITUS, TYPE II (ICD-250.00) (ICD10-E11.9)  LONG-TERM (CURRENT) USE OF STEROIDS (ICD-V58.65) (ICD10-Z79.51)  SKIN SAGGING DUE TO WEIGHT LOSS (ICD-757.9) (ICD10-Q84.9)  TRANSAMINASES, SERUM, ELEVATED (ICD-790.4) (ICD10-R74.0)  OBESITY, BMI 30-34.9, ADULT (ICD-278.00) (ICD10-E66.9)      DIABETES MELLITUS, TYPE II, UNCONTROLLED (ICD-250.02) (ICD10-E11.65)      FATTY LIVER DISEASE (ICD-571.8) (ICD10-K76.0)  SCLERODERMA, 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)  HYPOTHYROIDISM (ICD-244.9) (ICD10-E03.9)  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) (ICD10-D  80.8)  SPINAL STENOSIS, 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)  HEPATITIS C EXPOSURE (HCV RNA NEGATIVE, 01/2014) (ICD-V02.62) (ICD10-Z20.5)  PAIN IN JOINT, HAND (ICD-719.44) (ICD10-M79.643)  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  Past Medical History (changes today):  Added new problem of NAUSEA (ICD-787.02) (ICD10-R11.0)  Added new problem of DIARRHEA (ZHY-865.78) (ION62-X52.8)  Problems Reviewed:  Done  .  [name]  Medications (prior to today's visit):  HYDROXYCHLOROQUINE SULFATE 200 MG ORAL TABLET (HYDROXYCHLOROQUINE SULFATE)   one tablet oral daily; Route:  ORAL  ORENCIA 125 MG/ML SUBCUTANEOUS SOLUTION PREFILLED SYRINGE (ABATACEPT) injec  t once a week; Route: SUBCUTANEOUS  JARDIANCE 10 MG ORAL TABLET (EMPAGLIFLOZIN) Take 1 tablet by mouth once dai  ly; Route: ORAL  ROSUVASTATIN 10MG  TABLETS (ROSUVASTATIN CALCIUM) TAKE 1 TABLET BY MOUTH EVE  RY DAY  LISINOPRIL 2.5MG  TABLETS (LISINOPRIL) TAKE 1 TABLET BY MOUTH ONCE DAILY  METFORMIN HCL ER 500 MG XR24H-TAB (METFORMIN HCL) TAKE 4 TABLETS BY MOUTH E  VERY MORNING  GLIPIZIDE ER 2.5 MG XR24H-TAB (GLIPIZIDE) TAKE 1 TABLET BY MOUTH DAILY  PREDNISONE 10 MG ORAL TABLET (PREDNISONE) Take 1 tablet by mouth once daily  ; Route: ORAL  OMEPRAZOLE 20 MG ORAL CAPSULE DELAYED RELEASE (OMEPRAZOLE) Take one capsule   by mouth twice a day; Route: ORAL  IBUPROFEN 600 MG ORAL TABLET (IBUPROFEN) 1 tab by mouth TID as needed for p  ain  BACLOFEN 10MG  TABLETS (BACLOFEN) TAKE 1/2 TABLET BY MOUTH EVERY DAY AS NEED  ED FOR BACK OR SPASMS  DICLOFENAC SODIUM 1 % TRANSDERMAL GEL (DICLOFENAC SODIUM) APP 2 GRAMS EXT A  A BID      FREESTYLE FREEDOM LITE w/Device KIT (BLOOD GLUCOSE MONITORING SUPPL) [place]  e as directed (ICD 10 E11.65)      FREESTYLE LITE BLOOD GLUCOSE STRIPS (GLUCOSE BLOOD) USE TO CHECK BLOOD   GLUCOSE THREE TIMES DAILY      FREESTYLE LANCETS (LANCETS) check fingerstick three times a day (ICD 10   E11.65)  TRAMADOL HCL 50 MG ORAL TABLET (TRAMADOL HCL) take one tablet daily as need  ed for pain; Route: ORAL  OXYCODONE HCL 5 MG ORAL TABLET (OXYCODONE HCL) Partial fill upon patient re  quest.  Take one tablet daily as needed for pain >8; Route: ORAL  CALCIUM CARBONATE-VITAMIN D 600-200 MG-UNIT ORAL TABLET (CALCIUM CARBONATE-  VITAMIN D) take one tablet twice a day; Route: ORAL  GABAPENTIN 300MG  CAPSULES (GABAPENTIN) TAKE 1 CAPSULE BY MOUTH EVERY NIGHT   AT BEDTIME AS NEEDED FOR PAIN  FAMOTIDINE 20 MG ORAL TABLET (FAMOTIDINE) ; Route: ORAL  .  Medications (after today's visit):  HYDROXYCHLOROQUINE SULFATE 200 MG ORAL TABLET (HYDROXYCHLOROQUINE  SULFATE)   one tablet oral daily; Route: ORAL  JARDIANCE 10 MG ORAL TABLET (EMPAGLIFLOZIN) Take 1 tablet by mouth once dai  ly; Route: ORAL  ROSUVASTATIN 10MG  TABLETS (ROSUVASTATIN CALCIUM) TAKE 1 TABLET BY MOUTH EVE  RY DAY  LISINOPRIL 2.5MG  TABLETS (LISINOPRIL) TAKE 1 TABLET BY MOUTH ONCE DAILY  METFORMIN HCL ER 500 MG XR24H-TAB (METFORMIN HCL) TAKE 4 TABLETS BY MOUTH E  VERY MORNING  GLIPIZIDE ER 2.5 MG XR24H-TAB (GLIPIZIDE) TAKE 1 TABLET BY MOUTH DAILY  PREDNISONE 10 MG ORAL TABLET (PREDNISONE) Take 1 tablet by mouth once daily  ; Route: ORAL  OMEPRAZOLE 20 MG ORAL CAPSULE DELAYED RELEASE (OMEPRAZOLE) Take one capsule   by mouth twice a day; Route: ORAL  IBUPROFEN 600 MG ORAL TABLET (IBUPROFEN) 1 tab by mouth TID as needed for p  ain  BACLOFEN 10MG  TABLETS (BACLOFEN) TAKE 1/2 TABLET BY MOUTH EVERY DAY AS NEED  ED FOR BACK OR SPASMS  DICLOFENAC SODIUM 1 % TRANSDERMAL GEL (DICLOFENAC SODIUM)  APP 2 GRAMS EXT A  A BID      FREESTYLE FREEDOM LITE w/Device KIT (BLOOD GLUCOSE MONITORING SUPPL) [place]  e as directed (ICD 10 E11.65)      FREESTYLE LITE BLOOD GLUCOSE STRIPS (GLUCOSE BLOOD) USE TO CHECK BLOOD   GLUCOSE THREE TIMES DAILY      FREESTYLE LANCETS (LANCETS) check fingerstick three times a day (ICD 10   E11.65)  TRAMADOL HCL 50 MG ORAL TABLET (TRAMADOL HCL) take one tablet daily as need  ed for pain; Route: ORAL  OXYCODONE HCL 5 MG ORAL TABLET (OXYCODONE HCL) Partial fill upon patient re  quest.  Take one tablet daily as needed for pain >8; Route: ORAL  CALCIUM CARBONATE-VITAMIN D 600-200 MG-UNIT ORAL TABLET (CALCIUM CARBONATE-  VITAMIN D) take one tablet twice a day; Route: ORAL  GABAPENTIN 300MG  CAPSULES (GABAPENTIN) TAKE 1 CAPSULE BY MOUTH EVERY NIGHT   AT BEDTIME AS NEEDED FOR PAIN  FAMOTIDINE 20 MG ORAL TABLET (FAMOTIDINE) ; Route: ORAL  .  Medications Reviewed:  Done  .  [name]  No Known Allergies  Allergies Reviewed:  Done  .  .  .  .  Vitals from Pt:   Ht: 62.5 in.  Wt: 169.6 lbs.  BMI (in-lb) 30.64  Temp:  97.8deg F.     .  .  .  .  Assessment /T/ Plan:   Current Problems:   DIARRHEA (ICD-787.91) (ICD10-R19.7)  NAUSEA (ICD-787.02) (ICD10-R11.0)  Discussed BRAT diet, but also looking up and trying the low FODMAPS diet, i  n case it helps her symptoms, or if she notices an improvement with a chang  e in diet.  Will check labs and stool testing, if it's possible to do this testing.   Patient will find out if her nurse who comes next week for her infusions at   home can do her labwork, and she will find out what kind of an order is ne  eded.  .  .  .  .  I spent 15 minutes with the patient counseling and treating the above condi  tions.  Location of Provider:  Home  Location of Patient:  Home  Names of Participants for Visit: patient and physician  .  Orders (this visit):  Telephone Call Intermediate (11-20 min) [[id number]]  .  .  .  Patient Care Plan  .  [name]  Immunization Worksheet 2019 (rev 05/16/2018)   .  .  .  .  .  .  .  .  .  .  .  .  .  Follow-up With:   .  [name]  Electronically Signed by [name], MD on 08/09/2018 at 10:02 AM  ________________________________________________________________________

## 2018-08-09 NOTE — Telephone Encounter (Signed)
 General Medicine Tele-Medicine Visit  This visit is done remotely with myself and this patient.  Patient presents during the COVID-19 pandemic / federally declared state of public health emergency.  Visit Type: Audio Visit Only  Patient consent for audio or video type visit:  Yes  Patient does not have access to video visit:  Yes  Chief Complaint:   intermittent GI illnesses    History of Present Illness:   yesterday and tuesday temperature was 99.1.  Been feeling sick in the stomach a month ago thought she got food poisoning from a meat. After 4 days of throwing up felt better for about 5 days then got bad diarrhea for about 4 days, keep feeling better then getting sick , nauseous and diarrhea .  This is not typical for her. She has ongoing several days of nausea, diarrhea, and then feels better, and then it starts up again for a few days. This has been going on for a month. Feels like she is "burning up" whenever this happens. Not associated with any foods she's noticed. Tried cutting out dairy but it didn't matter. She has been trying to eat soups, toast etc. Eating solid foods has made her have symptoms again.             Family History: (reviewed)   Father: DMII, died of MI at 54  Mom: died of lung CA at 54  MGM and PGM: lung ca (smokers)  MGF and several other second degree relatives: brain aneurysms in their 72's.  2 maternal uncles:melanoma (one died of unknown cancer, one living)  brother recently diagnosed with systemic scleroderma    Social History: (reviewed)   Non-smoker, rare etoh, no IVDU. 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 Living together with her lifelong partner of ~20 years - may be getting legally married to him soon. Sexually active with husband only. No children. Always wears a seat belt.      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)  DIABETES MELLITUS, TYPE II (ICD-250.00) (ICD10-E11.9)  LONG-TERM (CURRENT) USE OF STEROIDS (ICD-V58.65) (ICD10-Z79.51)  SKIN SAGGING DUE TO WEIGHT LOSS (ICD-757.9) (ICD10-Q84.9)  TRANSAMINASES, SERUM, ELEVATED (ICD-790.4) (ICD10-R74.0)  OBESITY, BMI 30-34.9, ADULT (ICD-278.00) (ICD10-E66.9)      DIABETES MELLITUS, TYPE II, UNCONTROLLED (ICD-250.02) (ICD10-E11.65)      FATTY LIVER DISEASE (ICD-571.8) (ICD10-K76.0)  SCLERODERMA, 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)  HYPOTHYROIDISM (ICD-244.9) (ICD10-E03.9)  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) (ICD10-D80.8)  SPINAL STENOSIS, 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)  HEPATITIS C EXPOSURE (HCV RNA NEGATIVE, 01/2014) (ICD-V02.62) (ICD10-Z20.5)  PAIN IN JOINT, HAND (ICD-719.44) (ICD10-M79.643)  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)  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)  CARPAL TUNNEL (ICD-354.0) (ICD10-G56.00)  OSTEOARTHRITIS (ICD-715.09) (ICD10-M15.9)  S/P ROTATOR CUFF SURGERY 12/2004 - RT SHOULDER; 2008 LT (ICD-V45.89)  Family Hx of MELANOMA, FAMILY HX (ICD-V16.8) (ICD10-Z80.8)  FRACTURE OF OTHER SPEC SITE,  PATHOLOGIC - MULTIPLE (ICD-733.19)  CHOLECYSTECTOMY AND HERNIA REPAIR (ICD-V45.89)  VITAMIN D DEFICIENCY (ICD-268.9) (ICD10-E55.9)  COLONOSCOPY, NEXT 2020- SEE COMMENT (ICD-V76.51) (ICD10-Z01.89)    Past Medical History (changes today):  Added new problem of NAUSEA (ICD-787.02) (ICD10-R11.0)  Added new problem of  DIARRHEA (ICD-787.91) (ICD10-R19.7)     Problems Reviewed:  Done      Medications (prior to today's visit):  HYDROXYCHLOROQUINE SULFATE 200 MG ORAL TABLET (HYDROXYCHLOROQUINE SULFATE) one tablet oral daily; Route: ORAL  ORENCIA 125 MG/ML SUBCUTANEOUS SOLUTION  PREFILLED SYRINGE (ABATACEPT) inject once a week; Route: SUBCUTANEOUS  JARDIANCE 10 MG ORAL TABLET (EMPAGLIFLOZIN) Take 1 tablet by mouth once daily; Route: ORAL  ROSUVASTATIN 10MG  TABLETS (ROSUVASTATIN CALCIUM) TAKE 1 TABLET BY MOUTH EVERY DAY  LISINOPRIL 2.5MG  TABLETS (LISINOPRIL) TAKE 1 TABLET BY MOUTH ONCE DAILY  METFORMIN HCL ER 500 MG XR24H-TAB (METFORMIN HCL) TAKE 4 TABLETS BY MOUTH EVERY MORNING  GLIPIZIDE ER 2.5 MG XR24H-TAB (GLIPIZIDE) TAKE 1 TABLET BY MOUTH DAILY  PREDNISONE 10 MG ORAL TABLET (PREDNISONE) Take 1 tablet by mouth once daily; Route: ORAL  OMEPRAZOLE 20 MG ORAL CAPSULE DELAYED RELEASE (OMEPRAZOLE) Take one capsule by mouth twice a day; Route: ORAL  IBUPROFEN 600 MG ORAL TABLET (IBUPROFEN) 1 tab by mouth TID as needed for pain  BACLOFEN 10MG  TABLETS (BACLOFEN) TAKE 1/2 TABLET BY MOUTH EVERY DAY AS NEEDED FOR BACK OR SPASMS  DICLOFENAC SODIUM 1 % TRANSDERMAL GEL (DICLOFENAC SODIUM) APP 2 GRAMS EXT AA BID      FREESTYLE FREEDOM LITE w/Device KIT (BLOOD GLUCOSE MONITORING SUPPL) use as directed (ICD 10 E11.65)      FREESTYLE LITE BLOOD GLUCOSE STRIPS (GLUCOSE BLOOD) USE TO CHECK BLOOD GLUCOSE THREE TIMES DAILY      FREESTYLE LANCETS (LANCETS) check fingerstick three times a day (ICD 10 E11.65)  TRAMADOL HCL 50 MG ORAL TABLET (TRAMADOL HCL) take one tablet daily as needed for pain; Route: ORAL  OXYCODONE HCL 5 MG ORAL TABLET (OXYCODONE HCL) Partial fill upon patient request.  Take one tablet daily as needed for pain >8; Route: ORAL  CALCIUM CARBONATE-VITAMIN D 600-200 MG-UNIT ORAL TABLET (CALCIUM CARBONATE-VITAMIN D) take one tablet twice a day; Route: ORAL  GABAPENTIN 300MG  CAPSULES (GABAPENTIN) TAKE 1 CAPSULE BY MOUTH EVERY NIGHT AT BEDTIME AS NEEDED FOR PAIN  FAMOTIDINE 20 MG ORAL TABLET (FAMOTIDINE) ; Route: ORAL    Medications (after today's visit):  HYDROXYCHLOROQUINE SULFATE 200 MG ORAL TABLET (HYDROXYCHLOROQUINE SULFATE) one tablet oral daily; Route: ORAL  JARDIANCE 10 MG ORAL TABLET  (EMPAGLIFLOZIN) Take 1 tablet by mouth once daily; Route: ORAL  ROSUVASTATIN 10MG  TABLETS (ROSUVASTATIN CALCIUM) TAKE 1 TABLET BY MOUTH EVERY DAY  LISINOPRIL 2.5MG  TABLETS (LISINOPRIL) TAKE 1 TABLET BY MOUTH ONCE DAILY  METFORMIN HCL ER 500 MG XR24H-TAB (METFORMIN HCL) TAKE 4 TABLETS BY MOUTH EVERY MORNING  GLIPIZIDE ER 2.5 MG XR24H-TAB (GLIPIZIDE) TAKE 1 TABLET BY MOUTH DAILY  PREDNISONE 10 MG ORAL TABLET (PREDNISONE) Take 1 tablet by mouth once daily; Route: ORAL  OMEPRAZOLE 20 MG ORAL CAPSULE DELAYED RELEASE (OMEPRAZOLE) Take one capsule by mouth twice a day; Route: ORAL  IBUPROFEN 600 MG ORAL TABLET (IBUPROFEN) 1 tab by mouth TID as needed for pain  BACLOFEN 10MG  TABLETS (BACLOFEN) TAKE 1/2 TABLET BY MOUTH EVERY DAY AS NEEDED FOR BACK OR SPASMS  DICLOFENAC SODIUM 1 % TRANSDERMAL GEL (DICLOFENAC SODIUM) APP 2 GRAMS EXT AA BID      FREESTYLE FREEDOM LITE w/Device KIT (BLOOD GLUCOSE MONITORING SUPPL) use as directed (ICD 10 E11.65)      FREESTYLE LITE BLOOD GLUCOSE STRIPS (GLUCOSE BLOOD) USE TO CHECK BLOOD GLUCOSE THREE TIMES DAILY      FREESTYLE LANCETS (LANCETS) check fingerstick three times a day (ICD 10 E11.65)  TRAMADOL HCL 50 MG ORAL TABLET (TRAMADOL HCL) take one  tablet daily as needed for pain; Route: ORAL  OXYCODONE HCL 5 MG ORAL TABLET (OXYCODONE HCL) Partial fill upon patient request.  Take one tablet daily as needed for pain >8; Route: ORAL  CALCIUM CARBONATE-VITAMIN D 600-200 MG-UNIT ORAL TABLET (CALCIUM CARBONATE-VITAMIN D) take one tablet twice a day; Route: ORAL  GABAPENTIN 300MG  CAPSULES (GABAPENTIN) TAKE 1 CAPSULE BY MOUTH EVERY NIGHT AT BEDTIME AS NEEDED FOR PAIN  FAMOTIDINE 20 MG ORAL TABLET (FAMOTIDINE) ; Route: ORAL       Medications Reviewed:  Done      No Known Allergies  Allergies Reviewed:  Done          Vitals from Pt:   Ht: 62.5 in.  Wt: 169.6 lbs.  BMI (in-lb) 30.64  Temp: 97.8deg F.             Assessment & Plan:   Current Problems:   DIARRHEA (ICD-787.91) (ICD10-R19.7)  NAUSEA  (ICD-787.02) (ICD10-R11.0)  Discussed BRAT diet, but also looking up and trying the low FODMAPS diet, in case it helps her symptoms, or if she notices an improvement with a change in diet.  Will check labs and stool testing, if it's possible to do this testing.   Patient will find out if her nurse who comes next week for her infusions at home can do her labwork, and she will find out what kind of an order is needed.             I spent 15 minutes with the patient counseling and treating the above conditions.  Location of Provider:  Home  Location of Patient:  Home  Names of Participants for Visit: patient and physician    Orders (this visit):  Telephone Call Intermediate (11-20 min) [[id number]]        Patient Care Plan      Immunization Worksheet 2019 (rev 05/16/2018)                             Follow-up With:           Created By [name], MD on 08/09/2018 at 08:55 AM    Electronically Signed By [name], MD on 08/09/2018 at 10:02 AM

## 2018-08-09 NOTE — Progress Notes (Signed)
 [facility]  [address]  [place] [address]  Main: [phone]  Fax: [phone]  Patient Portal: [email or web address]        Aug 09, 2018            MRN: [id number]            DOB: [birth year]   [name]   [address]   [place] [address]      I am writing to let you know that the following appointments have been made for you.               Sincerely,         [facility]  [phone]        Created By Everlene Farrier on 08/09/2018 at 10:24 AM    Electronically Signed By Everlene Farrier on 08/09/2018 at 10:24 AM

## 2018-08-12 ENCOUNTER — Ambulatory Visit

## 2018-08-12 NOTE — Telephone Encounter (Signed)
 Call Details:   Patient PCP = [name], MD  [name] (Patient) called on Aug 12, 2018 1:16 PM.  Message taken by: Angela Adam  Primary call-back number: [phone]    Secondary call-back number: () -    Call Reason(s): Message/Call-Back      ** MESSAGE / CALL-BACK.  Regarding: Pt. stated that she spoke with [facility], and the nurse Leotis Shames will be out Friday 05/15. Stated that it is ok to do and add labs. Also stated that  new [facility] should be reaching out to the office, but if not a lab order should be submitted to them.    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) (ICD10-M47.812)  NECK PAIN (ICD-723.1) (ICD10-M54.2)  STEROID USE, LONG TERM (ICD-V58.65) (ICD10-Z79.52)  PREMATURE VENTRICULAR CONTRACTIONS (ICD-427.69) (ICD10-I49.3)  HYPERLIPIDEMIA (ICD-272.4) (ICD10-E78.5)  BACK PAIN, LUMBAR, CHRONIC (ICD-724.2) (ICD10-M54.5)  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)  DIABETES MELLITUS, TYPE II (ICD-250.00) (ICD10-E11.9)  LONG-TERM (CURRENT) USE OF STEROIDS (ICD-V58.65) (ICD10-Z79.51)  SKIN SAGGING DUE TO WEIGHT LOSS (ICD-757.9) (ICD10-Q84.9)  TRANSAMINASES, SERUM, ELEVATED (ICD-790.4) (ICD10-R74.0)  OBESITY, BMI 30-34.9, ADULT (ICD-278.00) (ICD10-E66.9)      DIABETES MELLITUS, TYPE II, UNCONTROLLED (ICD-250.02) (ICD10-E11.65)      FATTY LIVER DISEASE (ICD-571.8) (ICD10-K76.0)  SCLERODERMA, 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)  HYPOTHYROIDISM (ICD-244.9) (ICD10-E03.9)  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) (ICD10-D80.8)  SPINAL STENOSIS, 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)  HEPATITIS C EXPOSURE (HCV RNA NEGATIVE, 01/2014) (ICD-V02.62) (ICD10-Z20.5)  PAIN IN  JOINT, HAND (ICD-719.44) (ICD10-M79.643)  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)  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)  CARPAL TUNNEL (ICD-354.0) (ICD10-G56.00)  OSTEOARTHRITIS (ICD-715.09) (ICD10-M15.9)  S/P ROTATOR CUFF SURGERY 12/2004 - RT SHOULDER; 2008 LT (ICD-V45.89)  Family Hx of MELANOMA, FAMILY HX (ICD-V16.8) (ICD10-Z80.8)  FRACTURE OF OTHER SPEC SITE,  PATHOLOGIC - MULTIPLE (ICD-733.19)  CHOLECYSTECTOMY AND HERNIA REPAIR (ICD-V45.89)  VITAMIN D DEFICIENCY (ICD-268.9) (ICD10-E55.9)  COLONOSCOPY, NEXT 2020- SEE COMMENT (ICD-V76.51) (ICD10-Z01.89)    Meds (prior to this call):   HYDROXYCHLOROQUINE SULFATE 200 MG ORAL TABLET (HYDROXYCHLOROQUINE SULFATE) one tablet oral daily; Route: ORAL  JARDIANCE 10 MG ORAL TABLET (EMPAGLIFLOZIN) Take 1 tablet by mouth once daily; Route: ORAL  ROSUVASTATIN 10MG  TABLETS (ROSUVASTATIN CALCIUM) TAKE 1 TABLET BY MOUTH EVERY DAY  LISINOPRIL 2.5MG  TABLETS (LISINOPRIL) TAKE 1 TABLET BY MOUTH ONCE DAILY  METFORMIN HCL ER 500 MG XR24H-TAB (METFORMIN HCL) TAKE 4 TABLETS BY MOUTH EVERY MORNING  GLIPIZIDE ER 2.5 MG XR24H-TAB (GLIPIZIDE) TAKE 1 TABLET BY MOUTH DAILY  PREDNISONE 10 MG ORAL TABLET (PREDNISONE) Take 1 tablet by mouth once daily; Route: ORAL  OMEPRAZOLE 20 MG ORAL CAPSULE DELAYED RELEASE (OMEPRAZOLE) Take one capsule by mouth twice a day; Route: ORAL  IBUPROFEN 600 MG ORAL TABLET (IBUPROFEN) 1 tab by mouth TID as needed for pain  BACLOFEN 10MG  TABLETS (BACLOFEN) TAKE 1/2 TABLET BY MOUTH EVERY DAY AS NEEDED FOR BACK OR SPASMS  DICLOFENAC SODIUM 1 % TRANSDERMAL GEL (DICLOFENAC SODIUM) APP 2 GRAMS EXT AA BID      FREESTYLE FREEDOM LITE w/Device KIT (BLOOD GLUCOSE MONITORING SUPPL) use as directed (ICD 10 E11.65)      FREESTYLE LITE BLOOD GLUCOSE STRIPS (GLUCOSE  BLOOD) USE TO CHECK BLOOD GLUCOSE THREE TIMES DAILY      FREESTYLE LANCETS (LANCETS) check fingerstick three times a day (ICD 10 E11.65)  TRAMADOL HCL 50 MG ORAL TABLET (TRAMADOL HCL) take one tablet  daily as needed for pain; Route: ORAL  OXYCODONE HCL 5 MG ORAL TABLET (OXYCODONE HCL) Partial fill upon patient request.  Take one tablet daily as needed for pain >8; Route: ORAL  CALCIUM CARBONATE-VITAMIN D 600-200 MG-UNIT ORAL TABLET (CALCIUM CARBONATE-VITAMIN D) take one tablet twice a day; Route: ORAL  GABAPENTIN 300MG  CAPSULES (GABAPENTIN) TAKE 1 CAPSULE BY MOUTH EVERY NIGHT AT BEDTIME AS NEEDED FOR PAIN  FAMOTIDINE 20 MG ORAL TABLET (FAMOTIDINE) ; Route: ORAL            Created By Angela Adam on 08/12/2018 at 01:15 PM    Electronically Signed By Clayborne Artist RN on 08/16/2018 at 10:11 AM

## 2018-08-15 ENCOUNTER — Ambulatory Visit

## 2018-08-15 NOTE — Telephone Encounter (Signed)
 Call Details:   Patient PCP = [name], MD  [name] (Patient) called on Aug 15, 2018 12:40 PM.  Message taken by: Angela Adam  Primary call-back number: [phone]    Secondary call-back number: () -    Call Reason(s): Message/Call-Back      ** MESSAGE / CALL-BACK.  Regarding: Pt. is requesting a cb from [facility]. her lab orders for her visit tomorrow with the nurse.    ---------- ---------- ---------- ---------- ---------- ----------       RESPONSE/ORDERS:  Pt. called back in regards to this and is requesting a call. ........................................Marland KitchenMymouna [name]  Aug 15, 2018 3:22 PM    Pt. is calling again reg. her lab order. .....................................Marland KitchenMarland KitchenAngela Adam  Aug 15, 2018 4:47 PM    I entered the order as a prescription under Meds. Can this be sent to her nurse? thanks .......................................[name], MD  Aug 16, 2018 7:54 AM    Pt. stated that her nurse will be there at her home @ 8:30 and would like to know the status of her order.  .....................................Marland KitchenMarland KitchenAngela Adam  Aug 16, 2018 8:09 AM     where is it being sent? fax #?.......................................Marland KitchenClayborne Artist, RN  Aug 16, 2018 9:07 AM    Nurse is calling would like a verbal order for the pt.   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) (ICD10-M47.812)  NECK PAIN (ICD-723.1) (ICD10-M54.2)  STEROID USE, LONG TERM (ICD-V58.65) (ICD10-Z79.52)  PREMATURE VENTRICULAR CONTRACTIONS (ICD-427.69) (ICD10-I49.3)  HYPERLIPIDEMIA (ICD-272.4)  (ICD10-E78.5)  BACK PAIN, LUMBAR, CHRONIC (ICD-724.2) (ICD10-M54.5)  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)  DIABETES MELLITUS, TYPE II (ICD-250.00) (ICD10-E11.9)  LONG-TERM (CURRENT) USE OF STEROIDS (ICD-V58.65) (ICD10-Z79.51)  SKIN SAGGING DUE TO WEIGHT LOSS (ICD-757.9) (ICD10-Q84.9)  TRANSAMINASES, SERUM, ELEVATED (ICD-790.4) (ICD10-R74.0)  OBESITY, BMI 30-34.9, ADULT (ICD-278.00) (ICD10-E66.9)      DIABETES MELLITUS, TYPE II, UNCONTROLLED (ICD-250.02) (ICD10-E11.65)      FATTY LIVER DISEASE (ICD-571.8) (ICD10-K76.0)  SCLERODERMA, 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)  HYPOTHYROIDISM (ICD-244.9) (ICD10-E03.9)  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) (ICD10-D80.8)  SPINAL STENOSIS, 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)  HEPATITIS C EXPOSURE (HCV RNA NEGATIVE, 01/2014) (ICD-V02.62) (ICD10-Z20.5)  PAIN IN JOINT, HAND (ICD-719.44) (ICD10-M79.643)  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)  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)  CARPAL TUNNEL (ICD-354.0) (ICD10-G56.00)  OSTEOARTHRITIS (ICD-715.09) (ICD10-M15.9)  S/P ROTATOR CUFF SURGERY 12/2004 - RT SHOULDER; 2008 LT (ICD-V45.89)  Family Hx of MELANOMA, FAMILY HX (ICD-V16.8) (ICD10-Z80.8)  FRACTURE OF OTHER SPEC SITE,  PATHOLOGIC - MULTIPLE (ICD-733.19)  CHOLECYSTECTOMY AND HERNIA REPAIR (ICD-V45.89)  VITAMIN D DEFICIENCY (ICD-268.9) (ICD10-E55.9)  COLONOSCOPY, NEXT 2020- SEE COMMENT (ICD-V76.51) (ICD10-Z01.89)    Meds (prior to this call):   HYDROXYCHLOROQUINE SULFATE 200 MG ORAL TABLET (HYDROXYCHLOROQUINE SULFATE) one tablet oral daily; Route: ORAL  JARDIANCE 10 MG ORAL TABLET (EMPAGLIFLOZIN) Take 1 tablet by mouth once daily; Route: ORAL  ROSUVASTATIN 10MG  TABLETS (ROSUVASTATIN CALCIUM) TAKE 1 TABLET BY MOUTH EVERY DAY  LISINOPRIL 2.5MG  TABLETS (LISINOPRIL) TAKE 1 TABLET BY MOUTH ONCE DAILY  METFORMIN HCL ER 500 MG XR24H-TAB (METFORMIN HCL)  TAKE 4 TABLETS BY MOUTH EVERY MORNING  GLIPIZIDE ER 2.5 MG XR24H-TAB (GLIPIZIDE) TAKE 1 TABLET  BY MOUTH DAILY  PREDNISONE 10 MG ORAL TABLET (PREDNISONE) Take 1 tablet by mouth once daily; Route: ORAL  OMEPRAZOLE 20 MG ORAL CAPSULE DELAYED RELEASE (OMEPRAZOLE) Take one capsule by mouth twice a day; Route: ORAL  IBUPROFEN 600 MG ORAL TABLET (IBUPROFEN) 1 tab by mouth TID as needed for pain  BACLOFEN 10MG  TABLETS (BACLOFEN) TAKE 1/2 TABLET BY MOUTH EVERY DAY AS NEEDED FOR BACK OR SPASMS  DICLOFENAC SODIUM 1 % TRANSDERMAL GEL (DICLOFENAC SODIUM) APP 2 GRAMS EXT AA BID      FREESTYLE FREEDOM LITE w/Device KIT (BLOOD GLUCOSE MONITORING SUPPL) use as directed (ICD 10 E11.65)      FREESTYLE LITE BLOOD GLUCOSE STRIPS (GLUCOSE BLOOD) USE TO CHECK BLOOD GLUCOSE THREE TIMES DAILY      FREESTYLE LANCETS (LANCETS) check fingerstick three times a day (ICD 10 E11.65)  TRAMADOL HCL 50 MG ORAL TABLET (TRAMADOL HCL) take one tablet daily as needed for pain; Route: ORAL  OXYCODONE HCL 5 MG ORAL TABLET (OXYCODONE HCL) Partial fill upon patient request.  Take one tablet daily as needed for pain >8; Route: ORAL  CALCIUM CARBONATE-VITAMIN D 600-200 MG-UNIT ORAL TABLET (CALCIUM CARBONATE-VITAMIN D) take one tablet twice a day; Route: ORAL  GABAPENTIN 300MG  CAPSULES (GABAPENTIN) TAKE 1 CAPSULE BY MOUTH EVERY NIGHT AT BEDTIME AS NEEDED FOR PAIN  FAMOTIDINE 20 MG ORAL TABLET (FAMOTIDINE) ; Route: ORAL    Changes to Meds (this update):   Added new medication of * BLOODWORK Please draw chemistry (sodium, potassium, magnesium, chloride, bicarbonate, glucose, creatinine, BUN), CBC, TSH, lipids, HbA1c. Fax result to [phone] Dr. Marlane Hatcher - Signed          Created By Angela Adam on 08/15/2018 at 12:40 PM    Electronically Signed By Clayborne Artist RN on 08/16/2018 at 10:10 AM

## 2018-08-16 ENCOUNTER — Ambulatory Visit

## 2018-08-16 LAB — UNMAPPED LAB RESULTS
Anion Gap (EXT): 13 mmol/L (ref 5–15)
BUN (EXT): 18 mg/dL (ref 7–20)
CO2 (EXT): 24 mmol/L (ref 20–32)
Chloride (EXT): 103 mmol/L (ref 97–109)
Potassium (EXT): 3.8 mmol/L (ref 3.2–5.1)
Sodium (EXT): 140 mmol/L (ref 134–144)

## 2018-08-16 NOTE — Telephone Encounter (Signed)
 Call Details:   Patient PCP = [name], MD  [name] (Patient) called on Aug 16, 2018 1:56 PM.  Message taken by: I Guadelupe Sabin  Primary call-back number: [phone]    Secondary call-back number: () -    Call Reason(s): Message/Call-Back      ** MESSAGE / CALL-BACK.  Regarding: Pt would like a call back to acknowledge [name] for her help in the past 2 days.    ---------- ---------- ---------- ---------- ---------- ----------       RESPONSE/ORDERS:    We love to get this type of feedback, thank you! I will [name] know..... 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)  DIABETES MELLITUS, TYPE II (ICD-250.00) (ICD10-E11.9)  LONG-TERM (CURRENT) USE OF STEROIDS (ICD-V58.65) (ICD10-Z79.51)  SKIN SAGGING DUE TO WEIGHT LOSS (ICD-757.9) (ICD10-Q84.9)  TRANSAMINASES, SERUM, ELEVATED (ICD-790.4) (ICD10-R74.0)  OBESITY, BMI 30-34.9, ADULT (ICD-278.00) (ICD10-E66.9)      DIABETES MELLITUS, TYPE II, UNCONTROLLED (ICD-250.02) (ICD10-E11.65)      FATTY LIVER DISEASE (ICD-571.8) (ICD10-K76.0)  SCLERODERMA, 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)  HYPOTHYROIDISM (ICD-244.9) (ICD10-E03.9)  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) (ICD10-D80.8)  SPINAL STENOSIS, 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)  HEPATITIS C EXPOSURE (HCV RNA NEGATIVE, 01/2014) (ICD-V02.62) (ICD10-Z20.5)  PAIN IN JOINT, HAND (ICD-719.44) (ICD10-M79.643)  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)  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)  CARPAL TUNNEL (ICD-354.0) (ICD10-G56.00)  OSTEOARTHRITIS (ICD-715.09)  (ICD10-M15.9)  S/P ROTATOR CUFF SURGERY 12/2004 - RT SHOULDER; 2008 LT (ICD-V45.89)  Family Hx of MELANOMA, FAMILY HX (ICD-V16.8) (ICD10-Z80.8)  FRACTURE OF OTHER SPEC SITE,  PATHOLOGIC - MULTIPLE (ICD-733.19)  CHOLECYSTECTOMY AND HERNIA REPAIR (ICD-V45.89)  VITAMIN D DEFICIENCY (ICD-268.9) (ICD10-E55.9)  COLONOSCOPY, NEXT 2020- SEE COMMENT (ICD-V76.51) (ICD10-Z01.89)    Meds (prior to this call):   HYDROXYCHLOROQUINE SULFATE 200 MG ORAL TABLET (HYDROXYCHLOROQUINE SULFATE) one tablet oral daily; Route: ORAL  JARDIANCE 10 MG ORAL TABLET (EMPAGLIFLOZIN) Take 1 tablet by mouth once daily; Route: ORAL  ROSUVASTATIN 10MG  TABLETS (ROSUVASTATIN CALCIUM) TAKE 1 TABLET BY MOUTH EVERY DAY  LISINOPRIL 2.5MG  TABLETS (LISINOPRIL) TAKE 1 TABLET BY MOUTH ONCE DAILY  METFORMIN HCL ER 500 MG XR24H-TAB (METFORMIN HCL) TAKE 4 TABLETS BY MOUTH EVERY MORNING  GLIPIZIDE ER 2.5 MG XR24H-TAB (GLIPIZIDE) TAKE 1 TABLET BY MOUTH DAILY  PREDNISONE 10 MG ORAL TABLET (PREDNISONE) Take 1 tablet by mouth once daily; Route: ORAL  OMEPRAZOLE 20 MG ORAL CAPSULE DELAYED RELEASE (OMEPRAZOLE) Take one capsule by mouth twice a day; Route: ORAL  IBUPROFEN 600 MG ORAL TABLET (IBUPROFEN) 1 tab by mouth TID as needed for pain  BACLOFEN 10MG  TABLETS (BACLOFEN) TAKE 1/2 TABLET BY MOUTH EVERY DAY AS NEEDED FOR BACK OR SPASMS  DICLOFENAC SODIUM 1 % TRANSDERMAL GEL (DICLOFENAC SODIUM) APP 2 GRAMS EXT AA BID      FREESTYLE FREEDOM LITE w/Device KIT (BLOOD GLUCOSE MONITORING SUPPL) use as directed (ICD 10 E11.65)      FREESTYLE LITE BLOOD GLUCOSE STRIPS (GLUCOSE BLOOD) USE TO CHECK BLOOD GLUCOSE THREE TIMES DAILY      FREESTYLE LANCETS (LANCETS) check fingerstick three times a day (ICD 10 E11.65)  TRAMADOL HCL 50 MG ORAL TABLET (TRAMADOL HCL) take one  tablet daily as needed for pain; Route: ORAL  OXYCODONE HCL 5 MG ORAL TABLET (OXYCODONE HCL) Partial fill upon patient request.  Take one tablet daily as needed for pain >8; Route: ORAL  CALCIUM CARBONATE-VITAMIN D  600-200 MG-UNIT ORAL TABLET (CALCIUM CARBONATE-VITAMIN D) take one tablet twice a day; Route: ORAL  GABAPENTIN 300MG  CAPSULES (GABAPENTIN) TAKE 1 CAPSULE BY MOUTH EVERY NIGHT AT BEDTIME AS NEEDED FOR PAIN  FAMOTIDINE 20 MG ORAL TABLET (FAMOTIDINE) ; Route: ORAL  * BLOODWORK Please draw chemistry (sodium, potassium, magnesium, chloride, bicarbonate, glucose, creatinine, BUN), CBC, TSH, lipids, HbA1c. Fax result to [phone] Dr. Marlane Hatcher            Created By I Guadelupe Sabin on 08/16/2018 at 01:56 PM    Electronically Signed By [name], MD on 09/25/2018 at 10:59 AM

## 2018-08-16 NOTE — Telephone Encounter (Signed)
 RESPONSE/ORDERS:    Called pt - she told me [name] already gave verbal order. Patient is doing better - no more nausea, diarrhea. But her stomach is still gurgly all the time. Will await lab results. 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)  DIABETES MELLITUS, TYPE II (ICD-250.00) (ICD10-E11.9)  LONG-TERM (CURRENT) USE OF STEROIDS (ICD-V58.65) (ICD10-Z79.51)  SKIN SAGGING DUE TO WEIGHT LOSS (ICD-757.9) (ICD10-Q84.9)  TRANSAMINASES, SERUM, ELEVATED (ICD-790.4) (ICD10-R74.0)  OBESITY, BMI 30-34.9, ADULT (ICD-278.00) (ICD10-E66.9)      DIABETES MELLITUS, TYPE II, UNCONTROLLED (ICD-250.02) (ICD10-E11.65)      FATTY LIVER DISEASE (ICD-571.8) (ICD10-K76.0)  SCLERODERMA, 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)  HYPOTHYROIDISM (ICD-244.9) (ICD10-E03.9)  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) (ICD10-D80.8)  SPINAL STENOSIS, 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)  HEPATITIS C EXPOSURE (HCV RNA NEGATIVE, 01/2014) (ICD-V02.62) (ICD10-Z20.5)  PAIN IN JOINT, HAND (ICD-719.44) (ICD10-M79.643)  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)  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)  CARPAL TUNNEL (ICD-354.0) (ICD10-G56.00)  OSTEOARTHRITIS (ICD-715.09) (ICD10-M15.9)  S/P ROTATOR CUFF SURGERY 12/2004 - RT SHOULDER; 2008 LT (ICD-V45.89)  Family Hx of MELANOMA, FAMILY HX (ICD-V16.8) (ICD10-Z80.8)  FRACTURE OF OTHER SPEC SITE,  PATHOLOGIC - MULTIPLE (ICD-733.19)  CHOLECYSTECTOMY AND HERNIA REPAIR (ICD-V45.89)  VITAMIN D  DEFICIENCY (ICD-268.9) (ICD10-E55.9)  COLONOSCOPY, NEXT 2020- SEE COMMENT (ICD-V76.51) (ICD10-Z01.89)    Meds (prior to this call):   HYDROXYCHLOROQUINE SULFATE 200 MG ORAL TABLET (HYDROXYCHLOROQUINE SULFATE) one tablet oral daily; Route: ORAL  JARDIANCE 10 MG ORAL TABLET (EMPAGLIFLOZIN) Take 1 tablet by mouth once daily; Route: ORAL  ROSUVASTATIN 10MG  TABLETS (ROSUVASTATIN CALCIUM) TAKE 1 TABLET BY MOUTH EVERY DAY  LISINOPRIL 2.5MG  TABLETS (LISINOPRIL) TAKE 1 TABLET BY MOUTH ONCE DAILY  METFORMIN HCL ER 500 MG XR24H-TAB (METFORMIN HCL) TAKE 4 TABLETS BY MOUTH EVERY MORNING  GLIPIZIDE ER 2.5 MG XR24H-TAB (GLIPIZIDE) TAKE 1 TABLET BY MOUTH DAILY  PREDNISONE 10 MG ORAL TABLET (PREDNISONE) Take 1 tablet by mouth once daily; Route: ORAL  OMEPRAZOLE 20 MG ORAL CAPSULE DELAYED RELEASE (OMEPRAZOLE) Take one capsule by mouth twice a day; Route: ORAL  IBUPROFEN 600 MG ORAL TABLET (IBUPROFEN) 1 tab by mouth TID as needed for pain  BACLOFEN 10MG  TABLETS (BACLOFEN) TAKE 1/2 TABLET BY MOUTH EVERY DAY AS NEEDED FOR BACK OR SPASMS  DICLOFENAC SODIUM 1 % TRANSDERMAL GEL (DICLOFENAC SODIUM) APP 2 GRAMS EXT AA BID      FREESTYLE FREEDOM LITE w/Device KIT (BLOOD GLUCOSE MONITORING SUPPL) use as directed (ICD 10 E11.65)      FREESTYLE LITE BLOOD GLUCOSE STRIPS (GLUCOSE BLOOD) USE TO CHECK BLOOD GLUCOSE THREE TIMES DAILY      FREESTYLE LANCETS (LANCETS) check fingerstick three times a day (ICD 10 E11.65)  TRAMADOL HCL 50 MG ORAL TABLET (TRAMADOL HCL) take one tablet daily as needed for pain; Route: ORAL  OXYCODONE HCL 5 MG ORAL TABLET (OXYCODONE HCL) Partial fill upon patient request.  Take one tablet daily as needed for pain >8; Route: ORAL  CALCIUM CARBONATE-VITAMIN D 600-200 MG-UNIT ORAL TABLET (CALCIUM CARBONATE-VITAMIN D) take one tablet twice a day; Route: ORAL  GABAPENTIN 300MG  CAPSULES (GABAPENTIN) TAKE 1 CAPSULE  BY MOUTH EVERY NIGHT AT BEDTIME AS NEEDED FOR PAIN  FAMOTIDINE 20 MG ORAL TABLET (FAMOTIDINE) ; Route: ORAL  *  BLOODWORK Please draw chemistry (sodium, potassium, magnesium, chloride, bicarbonate, glucose, creatinine, BUN), CBC, TSH, lipids, HbA1c. Fax result to [phone] Dr. Marlane Hatcher            Created By [name], MD on 08/16/2018 at 10:11 AM    Electronically Signed By [name], MD on 08/16/2018 at 10:12 AM

## 2018-08-19 ENCOUNTER — Ambulatory Visit

## 2018-08-19 MED ORDER — METFORMIN HCL: 4 | 360 | 1 refills | 0 days | Status: AC

## 2018-08-20 ENCOUNTER — Ambulatory Visit: Admitting: Rheumatology

## 2018-08-23 ENCOUNTER — Ambulatory Visit: Admitting: Hand Surgery

## 2018-08-23 ENCOUNTER — Ambulatory Visit: Admitting: Rheumatology

## 2018-08-23 ENCOUNTER — Ambulatory Visit

## 2018-08-23 ENCOUNTER — Ambulatory Visit: Admit: 2018-08-23

## 2018-08-23 MED ORDER — Omeprazole: 40 | 90 | Freq: Every day | 1 refills | 0 days | Status: AC

## 2018-08-23 NOTE — Progress Notes (Signed)
 * * *    [name], [name] **DOB:** [birth year] (58 yo F) **Acc No.** [id number] **DOS:**  08/23/2018    ---       [name], [name]**    ------    41 Y old Female, DOB: [birth year], External MRN: [id number]    Account Number: [id number]    [address], [name], [id number]    Home: [phone]    Guarantor: [name] Insurance: [id number] PPO    PCP: [name] Bene, MD Referring: [name] Bene, MD    Appointment Facility: [facility]        * * *    08/23/2018 Progress Notes: [name] Pimple, MD **CHN#:** [id number]    ------    ---       **History of Present Illness**    ---     _GENERAL_ :    This is a Telehealth visit due to the COVID-19 pandemic. The visit is taking  place using audio only. The patient has consented to this format. The patient  is at home. The provider is in office.    She is feeling quite poorly. Back, right thigh is very painful, having trouble  walking causing more pain. Right hand is very swollen all the time, losing  dexterity. Harder to oppose thumb on outside of wrist hand from pinky to 4  inches from wrist. She feels that she will soon permanently lose function in  her hands.    Had been doing PT but had to stop due to COVID. Can't get to Dr. [name] for  another hip injection...    HCQ, prednisone 20 mg daily, using diclofenac gel, brace on right wrist 10 hrs  per day    Still getting IVIG infusions every 4 weeks.    No recent infections or symptoms of infection.      **Current Medications**    ---    Taking    * Baclofen 10 MG Tablet 1/2 tab Orally prn    ---    * Calcium Carbonate-Vitamin D 600-200 MG-UNIT Capsule 1 capsule with a meal Orally Twice a day    ---    * Famotidine 40 MG Tablet 1 tablet at bedtime Orally Once a day    ---    * Gammagard - Solution as directed Injection once every 4 weeks    ---    * GlipiZIDE XL 5 MG Tablet Extended Release 24 Hour 1 tablet Orally Once a day    ---    * Glucophage XR 500 mg Tablet Extended Release 24 Hour 4 tablets Orally  Once a day    ---    * Hydroxychloroquine Sulfate 200 mg Tablet 1 tablet with food or milk Orally Once a day    ---    * Omeprazole 40 mg Capsule Delayed Release 1 capsule Orally Once a day    ---    * PredniSONE 10 MG Tablet TAKE 2 TABLETS BY MOUTH ONCE A DAY     ---    * Rosuvastatin Calcium 10 MG Tablet 1 tablet Orally Once a day    ---    * Sulindac 150 mg Tablet 1 tablet with food Orally Twice a day    ---    * Tramadol HCl 50 MG Tablet 1 tablet Orally every 6 hrs    ---    * Voltaren 1 % Gel 2 grams Transdermal twice daily    ---    * Medication List reviewed and reconciled with  the patient    ---      **Past Medical History**    ---      Scleroderma - CREST dx 2007.        ---    IgG4 deficiency s/p IVIG 2010 ----single infusion given preventively after  week of bilateral knee replacements at [facility] 2010.        ---    Fx left wrist in 1994.        ---    Blood clot at LUE in 2006 on a short course of Coumadin.        ---    Bilateral carpal tunnel syndrome s/p Lt carpal tunnel release and steroids  injection right--.        ---    Bone spur left foot.        ---    Scoliosis.        ---    Spinal stenosis s/p steroids injection.        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 Katrinka Blazing 2010--- never infected, but packed with antibiotics with  surgery;.        ---    Right rotator cuff repairs x 4, complicated by repeat tears, infection,  placement of anchor material.        ---    Elevated liver function tests.        ---    Diabetes.        ---      **Surgical History**    ---      Right rotator cuff repair, with infected hardware that had to be removed  2006    ---    Repeat right shoulder surgery, also which became infected. 2007    ---    Left rotator cuff repair 2008    ---    bilateral knee replacements 2009    ---    Left arthroscopic carpal tunnel release 01/2011    ---    ORIF Left 4th metatarsal bone    ---    Left Ulna shortening 1994    ---     Knee replacement 2010    ---    Hand surgery 2013, 2015    ---    remove gallbladder/hernia 1990    ---    Plate left foot 3rd metatarsal 2008    ---      **Social History**    ---    Tobacco history: Never smoked.    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    Alcohol  Former daily EtOH use in 20s.  Nonsmoker.    Lives with longstanding boyfriend.    ---      **Allergies**    ---      N.K.D.A.    ---      **Hospitalization/Major Diagnostic Procedure**    ---      as above    ---     **Review of Systems**    ---     _Rheumatology_ :    General No fever, weight loss, swollen glands. Muscoskeletal +Joint pain,  +joint swelling, +morning stiffness . Eyes No vision loss, eye dry, red eye,  eye pain. Mouth No mouth sores. Cardiovascular ? raynaud, no chest pain ,  irregular heart beat, racing heart beat, leg swelling. Pulmonary No cough,  cough blood, shortness of breath. Skin No rash, photosensitivity. Lymphatics  No swollen glands, tender glands.         **  Examination**    ---     _Rheum Remote Exam:_    Phone only.          **Assessments**    ---    1\. 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 Type 2 diabetes mellitus with hyperglycemia, without long-term current use  of insulin - E11.65    ---    3\. Long-term use of high-risk medication - Z79.899    ---    4\. 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 We had a discussion last time about the options an again had a long  discussion about the risk benefit of various treatments. We discussed the fact  that steroids appear to be high risk in the context of the COVID pandemic.  After discussing the options, we settled on Rituximab, which has evidence for  the treatment of RA in the context of COVID. She is still getting IVIG which  hopefully should provide some protection for other infections. She will  continue to  practice social distancing and hand hygiene. I encouraged her to  come in to see me or ortho for injections as needed. I will plan to see her  back in 2 months for further follow-up and evaluation.    Length of visit: 25 minutes    Greater than 50% of this 25 minute visit was spent counseling and/or  coordinating care.    ---      **Treatment**    ---      **1\. Seronegative rheumatoid arthritis**    Continue Gammagard Solution, -, as directed, Injection, once every 4 weeks    Continue Hydroxychloroquine Sulfate Tablet, 200 mg, 1 tablet with food or  milk, Orally, Once a day    Continue Tramadol HCl Tablet, 50 MG, 1 tablet, Orally, every 6 hrs    Continue PredniSONE Tablet, 10 MG, TAKE 2 TABLETS BY MOUTH ONCE A DAY    Start Rituximab Solution, 500 MG/50ML, as directed, Intravenous    ---      **Procedure Codes**    ---      [id number] PHONE E/M BY PHYS 21-30 MIN    ---    Electronically signed by Erasmo Leventhal , MD on 09/01/2018 at 05:33 PM EDT    Sign off status: Completed        * * *        [facility]ology, [facility]ology    [address]    [facility], [address]    [place], [place] [address]    Tel: [phone]    Fax: [phone]              * * *          Progress Note: [name] Pimple, MD 08/23/2018    ---    Note generated by eClinicalWorks EMR/PM Software ([email or web address])

## 2018-08-23 NOTE — Progress Notes (Signed)
* * *      [name], [name] **DOB:** 1961-03-29 (58 yo F) **Acc No.** [id number] **DOS:**  08/23/2018    ---       [name], [name]**    ------    32 Y old Female, DOB: 12/12/1960    [address] [place], [place] [address]    Home: [phone]    Provider: [name]        * * *    Telephone Encounter    ---    Answered by  Linton Rump Date: 08/23/2018       Time: 09:55 AM    Action Taken                     [name],[name]  08/23/2018 9:55:26 AM >        ------            Refills Refill Omeprazole Capsule Delayed Release, 40 mg, Orally, 90, 1  capsule, Once a day, 90 days, Refills=1    ------          * * *                ---          * * *         Provider: [name] 08/23/2018    ---    Note generated by eClinicalWorks EMR/PM Software ([email or web address])

## 2018-08-23 NOTE — Progress Notes (Signed)
* * *      [name], [name] **DOB:** Jun 28, 1960 (58 yo F) **Acc No.** [id number] **DOS:**  08/23/2018    ---       [name], [name]**    ------    45 Y old Female, DOB: 05/26/1960    [address], [place], [place] [address]    Home: [phone]    Provider: Yetta Numbers        * * *    Telephone Encounter    ---    Answered by  Gypsy Lore Date: 08/23/2018       Time: 09:33 AM    Caller  [name], pt    ------            Reason  Appointment            Message                     Good morning,             Pt would like to schedule an appointment to see Dr. [name]. She can be reached at [phone]. Thank you.                 Action Taken                     [name],[name]  08/23/2018 9:34:41 AM >      Persico,Claudio  08/27/2018 12:18:18 PM > , Action - Pt telephoned.  Spoke to pt.                    * * *                ---          * * *         Provider: Yetta Numbers 08/23/2018    ---    Note generated by [employer] EMR/PM Software ([email or web address])

## 2018-08-23 NOTE — Progress Notes (Signed)
* * *      Renninger, Dayannara **DOB:** 1961/02/06 (58 yo F) **Acc No.** [id number] **DOS:**  08/23/2018    ---       [name], Arlo**    ------    [name], DOB: 1960/11/15    [address] [place], [place] [address]    Home: [phone]    Provider: [name]        * * *    Telephone Encounter    ---    Answered by  Marland Mcalpine Date: 08/23/2018       Time: 10:58 AM    Caller  Patient    ------            Reason  insurance issue            Message                     Good Morning,      Patient would like [name] to know that she got in touch with the registration department and they checked her insurance and everything should be all set now. If there's any other issues please contact the patient.            Thanks.                Action Taken                     [name],Layza  08/23/2018 11:00:05 AM >      [name],[name]  08/23/2018 11:03:31 AM > noted                    * * *                ---          * * *         Provider: [name] 08/23/2018    ---    Note generated by eClinicalWorks EMR/PM Software ([email or web address])

## 2018-08-23 NOTE — Progress Notes (Signed)
 .  Progress Notes  .  Patient: [name], [name]  Provider: [name]  DOB: [birth year] Age: 58 Y Sex: Female  .  PCP: [name]  MD  Date: 08/23/2018  .  --------------------------------------------------------------------------------  .  HISTORY OF PRESENT ILLNESS  .  GENERAL:  This is a Telehealth visit due to the  COVID-19 pandemic. The visit is taking place using audio only.  The patient has consented to this format. The patient is at home.  The provider is in office.   She is feeling quite poorly. Back, right thigh is very painful,  having trouble walking causing more pain. Right hand is very  swollen all the time, losing dexterity. Harder to oppose thumb on  outside of wrist hand from pinky to 4 inches from wrist. She  feels that she will soon permanently lose function in her  hands.Had been doing PT but had to stop due to COVID. Can't get  to Dr. [name] for another hip injection...HCQ, prednisone 20 mg  daily, using diclofenac gel, brace on right wrist 10 hrs per  dayStill getting IVIG infusions every 4 weeks.No recent  infections or symptoms of infection.  .  CURRENT MEDICATIONS  .  Taking Baclofen 10 MG Tablet 1/2 tab Orally prn  Taking Calcium Carbonate-Vitamin D 600-200 MG-UNIT Capsule 1  capsule with a meal Orally Twice a day  Taking Famotidine 40 MG Tablet 1 tablet at bedtime Orally Once a  day  Taking Gammagard - Solution as directed Injection once every 4  weeks  Taking GlipiZIDE XL 5 MG Tablet Extended Release 24 Hour 1 tablet  Orally Once a day  Taking Glucophage XR 500 mg Tablet Extended Release 24 Hour 4  tablets Orally Once a day  Taking Hydroxychloroquine Sulfate 200 mg Tablet 1 tablet with  food or milk Orally Once a day  Taking Omeprazole 40 mg Capsule Delayed Release 1 capsule Orally  Once a day  Taking PredniSONE 10 MG Tablet TAKE 2 TABLETS BY MOUTH ONCE A DAY  Taking Rosuvastatin Calcium 10 MG Tablet 1 tablet Orally Once a  day  Taking Sulindac 150 mg Tablet 1 tablet with food  Orally Twice a  day  Taking Tramadol HCl 50 MG Tablet 1 tablet Orally every 6 hrs  Taking Voltaren 1 % Gel 2 grams Transdermal twice daily  Medication List reviewed and reconciled with the patient  .  PAST MEDICAL HISTORY  .  Scleroderma - CREST dx 2007  IgG4 deficiency s/p IVIG 2010 ----single infusion given  preventively after week of bilateral [facility] replacements at [facility]  2010  Fx left wrist in 1994  Blood clot at LUE in 2006 on a short course of Coumadin  Bilateral carpal tunnel syndrome s/p Lt carpal tunnel release and  steroids injection right--  Bone spur left foot  Scoliosis  Spinal stenosis s/p steroids injection  s/p bilateral knee replacements for valgus /arthritic  complications, performed by Dr. Katrinka Blazing 2010--- never infected, but  packed with antibiotics with surgery;  Right rotator cuff repairs x 4, complicated by repeat tears,  infection, placement of anchor material  Elevated liver function tests  Diabetes  .  ALLERGIES  .  N.K.D.A.  .  SURGICAL HISTORY  .  Right rotator cuff repair, with infected hardware that had to be  removed 2006  Repeat right shoulder surgery, also which became infected. 2007  Left rotator cuff repair 2008  bilateral knee replacements 2009  Left arthroscopic carpal tunnel release 01/2011  ORIF Left 4th metatarsal bone  Left Ulna shortening 1994  Knee replacement 2010  Hand surgery 2013, 2015  remove gallbladder/hernia 1990  Plate left foot 3rd metatarsal 2008  .  SOCIAL HISTORY  .  .  Tobacco  history: Never smoked.  .  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  .  Alcohol Former daily EtOH use in 20s.  .  Nonsmoker.Lives with longstanding boyfriend.  [name]  HOSPITALIZATION/MAJOR DIAGNOSTIC PROCEDURE  .  as above  .  REVIEW OF SYSTEMS  .  Rheumatology:  .  General    No fever, weight loss, swollen glands . Muscoskeletal     +Joint pain, +joint swelling, +morning stiffness  . Eyes    No  vision loss, eye dry, red eye, eye pain . Mouth    No mouth sores  . Cardiovascular    ?  raynaud, no chest pain , irregular heart  beat, racing heart beat, leg swelling . Pulmonary    No cough,  cough blood, shortness of breath . Skin    No rash,  photosensitivity . Lymphatics    No swollen glands, tender glands  .  [name]  EXAMINATION  .  Rheum Remote Exam: Phone only.  .  ASSESSMENTS  .  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  Type 2 diabetes mellitus with hyperglycemia, without long-term  current use of insulin - E11.65  .  Long-term use of high-risk medication - Z79.899  .  Synovitis of hand - M65.9  .  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  She has now been off biologics from some time with deterioration  in symptoms. We had a discussion last time about the options an  again had a long discussion about the risk benefit of various  treatments. We discussed the fact that steroids appear to be high  risk in the context of the COVID pandemic. After discussing the  options, we settled on Rituximab, which has evidence for the  treatment of RA in the context of COVID. She is still getting  IVIG which hopefully should provide some protection for other  infections. She will continue to practice social distancing and  hand hygiene. I encouraged her to come in to see me or ortho for  injections as needed. I will plan to see her back in 2 months for  further follow-up and evaluation.Length of visit: 25  minutesGreater than 50% of this 25 minute visit was spent  counseling and/or coordinating care.  .  TREATMENT  .  Seronegative rheumatoid arthritis  Continue Gammagard Solution, -, as directed, Injection, once  every 4 weeks  Continue Hydroxychloroquine Sulfate Tablet, 200 mg, 1 tablet with  food or milk, Orally, Once a day  Continue Tramadol HCl Tablet, 50 MG, 1 tablet, Orally, every 6  hrs  Continue PredniSONE Tablet, 10 MG, TAKE 2 TABLETS BY MOUTH ONCE A  DAY  Start Rituximab Solution, 500 MG/50ML, as directed, Intravenous  .  PROCEDURE CODES  .  [id number] PHONE E/M BY PHYS 21-30 MIN  .  Electronically signed  by Erasmo Leventhal , MD on  09/01/2018 at 05:33 PM EDT  .  Document electronically signed by [name]

## 2018-08-27 ENCOUNTER — Ambulatory Visit: Admitting: Rheumatology

## 2018-08-28 ENCOUNTER — Ambulatory Visit: Admitting: Rheumatology

## 2018-08-30 ENCOUNTER — Ambulatory Visit: Admitting: Hand Surgery

## 2018-08-30 ENCOUNTER — Ambulatory Visit

## 2018-08-30 ENCOUNTER — Ambulatory Visit: Admit: 2018-08-30

## 2018-09-02 ENCOUNTER — Ambulatory Visit

## 2018-09-02 NOTE — Progress Notes (Signed)
* * *      [name], [name] **DOB:** Jun 10, 1960 (58 yo F) **Acc No.** [id number] **DOS:**  09/02/2018    ---       [name], [name]**    ------    33 Y old Female, DOB: 05-30-1960    [address], [place], [place] [address]    Home: [phone]    Provider: Orson Slick        * * *    Telephone Encounter    ---    Answered by  Clyde Lundborg Date: 09/02/2018       Time: 09:03 AM    Caller  [facility]    ------            Reason  IVIG labs request            Message                     Good morning,            [facility] called to request copies of any IVIG labs drawn within the past year as well as office/consult notes. Please fax to [phone] attn: [name] [facility]) thanks                Action Taken                     [name],[name]  09/02/2018 9:04:07 AM >      [name],[name]  09/03/2018 11:33:07 AM > I faxed her two most recent consult notes but I don't see any IVIG results.                      * * *                ---          * * *         Provider: Orson Slick 09/02/2018    ---    Note generated by eClinicalWorks EMR/PM Software ([email or web address])

## 2018-09-03 ENCOUNTER — Ambulatory Visit: Admitting: Adult Health

## 2018-09-03 NOTE — Progress Notes (Signed)
* * *      Boot, Delvina **DOB:** 03-Jan-1961 (58 yo F) **Acc No.** [id number] **DOS:**  09/03/2018    ---       [name], Jocee**    ------    45 Y old Female, DOB: [birth year]    [address] [place], [place] [address]    Home: [phone]    Provider: [name]        * * *    Telephone Encounter    ---    Answered by  Renie Ora Date: 09/03/2018       Time: 02:16 PM    Reason  Telehealth appointment 6/5    ------            Message                     Patient states she does not have a smart phone or device with a camera. Please call for her 06/05 appointment [phone]                Action Taken                     Rochanda Harpham  09/03/2018 2:48:07 PM >                     * * *                ---          * * *         Provider: [name] 09/03/2018    ---    Note generated by eClinicalWorks EMR/PM Software ([email or web address])

## 2018-09-05 ENCOUNTER — Ambulatory Visit: Admitting: Rheumatology

## 2018-09-05 NOTE — Progress Notes (Signed)
* * *      Wiedel, [name] **DOB:** 1961-02-14 (58 yo F) **Acc No.** [id number] **DOS:**  09/05/2018    ---       [name], Margrete**    ------    87 Y old Female, DOB: Oct 19, 1960    [address], [place], [place] [address]    Home: [phone]    Provider: [name]        * * *    Telephone Encounter    ---    Answered by  Lindley Magnus Date: 09/05/2018       Time: 03:37 PM    Reason  *Med_Acc_IVIG_Pharmacy    ------            Message                     PA approved IVIG per Gibson Ramp, Ph [phone], plan type:COMM. PA effective from 10/01/2017 to 6/39/2021. PA #[id number]. RX is being provided by [facility] via home infusion. Letter is scaned under MISC      . A copy was forwarded to [facility].                                                                                                                                                                                                        Action Taken                     Lindley Magnus, [name] 09/05/2018 3:40:33 PM >      [name],[name] , [name] 09/05/2018 3:42:45 PM > Noted. Thank you!                     * * *                ---          * * *         Provider: [name] 09/05/2018    ---    Note generated by eClinicalWorks EMR/PM Software ([email or web address])

## 2018-09-06 ENCOUNTER — Ambulatory Visit

## 2018-09-06 ENCOUNTER — Ambulatory Visit: Admitting: Adult Health

## 2018-09-06 ENCOUNTER — Ambulatory Visit: Admitting: "Endocrinology

## 2018-09-06 ENCOUNTER — Ambulatory Visit: Admit: 2018-09-06

## 2018-09-06 MED ORDER — Rosuvastatin Calcium: 10 | Tablet | Freq: Every day | 3 refills | 0 days | Status: AC

## 2018-09-06 MED ORDER — GlipiZIDE XL: 5 | Tablet | Freq: Every day | 3 refills | 0 days | Status: AC

## 2018-09-06 MED ORDER — Glucophage XR: 500 | Tablet | Freq: Every day | 3 refills | 0 days | Status: AC

## 2018-09-06 MED ORDER — Jardiance: 10 | Tablet | Freq: Every day | 3 refills | 0 days | Status: AC

## 2018-09-06 MED ORDER — Lisinopril: 2.5 | Tablet | Freq: Every day | 3 refills | 0 days | Status: AC

## 2018-09-06 NOTE — Progress Notes (Signed)
 .  Progress Notes  .  Patient: [name]  Provider: [name]  DOB: Feb 22, 1961 Age: 58 Y Sex: Female  Supervising Provider:: Paulla Dolly, MD  Date: 09/06/2018  .  PCP: [name]  MD  Date: 09/06/2018  .  --------------------------------------------------------------------------------  .  REASON FOR APPOINTMENT  .  1. Phone call: [phone]  .  2. Followup for diabetes  .  HISTORY OF PRESENT ILLNESS  .  General:  The patient encounter today occurred via  Telehealth with the patient's verbal consent. The reason for the  tele-health visit was due to the COVID-19 pandemic crisis /  federally declared state of public health emergency, and need for  social distancing. The patient denies having any known contacts  with people diagnosed with COVID-19 or any signs of active  infection with COVID-19. The patient is reasonably trying to  maintain social distancing measures. . Method of Telehealth used  was a real-time, interactive, secure and private audio . Physical  location of the patient was at home. [name] Physical location of the  provider was at home------57 y/o female with scleroderma (on  prednisone), IgG-4 deficiency, NASH and prior uncontrolled T2DM.  Last endo visit was in January. She had a recent hemoglobin a1c  (pt docs) from outside [facility]. A1c was 7.3% (08/19/18).  Diabetes Meds:  .  glipizide 5mg   metformin 2000mg  daily  jardiance 10mg  once daily  _ __ _  reports hypoglycemia 40-60's range on a higher dose of glipizide  10mg .  .  A1c Trend:  .  08/19/18-----7.3%  04/12/18-----7.5%  01/04/18-----7.7%  09/14/17-----10%  03/02/16-----8.7%  08/05/15-----11%  11/04/14-----7.1%  12/31/13-----7%  05/06/13-----6.6%  01/04/18-----7.7%  09/14/17-----10%  03/02/16-----8.7%  08/05/15-----11%.  .  Recent Blood Glucoses:  .  unable to download glucometer today  denies lows  -----  meter downloaded onto [employer] (04/12/18)  30 day bg average= 145 +/- 63  83% in range (70-180), 17% above range (>180)  limited bg  readings.  .  Diet:  .  3 meals daily, low CHO  working on reducing fat/cho portions--seen by Nutrition 04/2016 .  [name]  Exercise:  .  walks 1-2 mi daily with dog , .  .  Microvascular Complications Screening:  .  eyes: no acute vision changes, no hx of retinopathy, reports  annual exam with ophthalmologist is scheduled  kidneys: creat:0.91, egfr 71 (09/14/17), uACR:137 (06/2017)  feet: denies numbess or parethesias in feet, seen by Dr. Vassie Loll-  hammering of the lesser toes, given rx for custom orthotics , .  .  Macrovascular Complications Screening:  .  (+) HLD  on rosuvastatin  TChol: 169, TG 117, HDL:88, LDL:58 (08/19/18).  .  CURRENT MEDICATIONS  .  Taking Baclofen 10 MG Tablet 1/2 tab Orally prn  Taking Calcium Carbonate-Vitamin D 600-200 MG-UNIT Capsule 1  capsule with a meal Orally Twice a day  Taking Famotidine 40 MG Tablet 1 tablet at bedtime Orally Once a  day  Taking Gammagard - Solution as directed Injection once every 4  weeks  Taking GlipiZIDE XL 5 MG Tablet Extended Release 24 Hour 1 tablet  Orally Once a day  Taking Glucophage XR 500 mg Tablet Extended Release 24 Hour 4  tablets Orally Once a day  Taking Hydroxychloroquine Sulfate 200 mg Tablet 1 tablet with  food or milk Orally Once a day  Taking Jardiance 10 MG Tablet 1 tablet Orally Once a day  Taking Lisinopril 2.5 MG Tablet 1 tablet Orally Once a  day  Taking Omeprazole 40 mg Capsule Delayed Release 1 capsule Orally  Once a day  Taking PredniSONE 10 MG Tablet TAKE 2 TABLETS BY MOUTH ONCE A DAY  Taking Rosuvastatin Calcium 10 MG Tablet 1 tablet Orally Once a  day  Taking Sulindac 150 mg Tablet 1 tablet with food Orally Twice a  day  Taking Tramadol HCl 50 MG Tablet 1 tablet Orally every 6 hrs  Taking Voltaren 1 % Gel 2 grams Transdermal twice daily  Medication List reviewed and reconciled with the patient  .  PAST MEDICAL HISTORY  .  Scleroderma - CREST dx 2007  IgG4 deficiency s/p IVIG 2010 ----single infusion given  preventively after week of bilateral  knee replacements at [facility]  2010  Fx left wrist in 1994  Blood clot at LUE in 2006 on a short course of Coumadin  Bilateral carpal tunnel syndrome s/p Lt carpal tunnel release and  steroids injection right--  Bone spur left foot  Scoliosis  Spinal stenosis s/p steroids injection  s/p bilateral knee replacements for valgus /arthritic  complications, performed by Dr. Katrinka Blazing 2010--- never infected, but  packed with antibiotics with surgery;  Right rotator cuff repairs x 4, complicated by repeat tears,  infection, placement of anchor material  Elevated liver function tests  Diabetes  .  ALLERGIES  .  yes[Allergies Verified]  .  SURGICAL HISTORY  .  Right rotator cuff repair, with infected hardware that had to be  removed 2006  Repeat right shoulder surgery, also which became infected. 2007  Left rotator cuff repair 2008  bilateral knee replacements 2009  Left arthroscopic carpal tunnel release 01/2011  ORIF Left 4th metatarsal bone  Left Ulna shortening 1994  Knee replacement 2010  Hand surgery 2013, 2015  remove gallbladder/hernia 1990  Plate left foot 3rd metatarsal 2008  .  FAMILY HISTORY  .  Mother: deceased 66 yrs, lung cancer, hyperthyroidism, diagnosed  with Other malignant neoplasm of unspecified site  Father: deceased 67s yrs, heart attack, Unspecified heart disease  3 brother(s) .  FH of arthritis and scleroderma\nFather deceased from MI\nMother  deceased age 69 with lung cancer \\nBrother  with scleroderma \/  copd.  .  SOCIAL HISTORY  .  .  Tobacco  history: Never smoked.  [name]  Work/Occupation: Administrator at fitness center.  .  .  Alcohol  Former daily EtOH use in 20s  .  Nonsmoker.Lives with longstanding boyfriend.  [name]  HOSPITALIZATION/MAJOR DIAGNOSTIC PROCEDURE  .  as above  .  REVIEW OF SYSTEMS  .  Diabetes:  .  General:    no unintended weight change, no fevers, malaise,  weakness . Eyes:    no vision loss . Cardiovascular:    no CP, no  orthopnea, no palpitations . Respiratory:    no wheezing, no  dyspnea  . Gastrointestinal:    no n/v, no abdominal pain .  Musculoskeletal:    no edema . Skin:    no rash . Neurological:     no numbness .  [name]  PHYSICAL EXAMINATION  .  Total time spent counseling   25 minutes. The entire visit was  spent on counseling for diabetes, including but not limited to  medication therapy, blood glucose monitoring, laboratory testing,  lifestyle measures and follow up plan.  .  ASSESSMENTS  .  Type 2 diabetes mellitus with hyperglycemia, without long-term  current use of insulin - E11.65 (Primary)  .  Other hyperlipidemia - E78.4  .  Obesity (  BMI 30-39.9) - E66.9  .  Ms. Nuon has a history of obesity, hypothyroidism, scleroderma,  hld and uncontrolled T2DM. Her most recent hemoglobin a1c is 7.3%  (08/19/18) on her usual medication regimen. She continues to deny  hypoglycemia. Discussed that if she is experiencing hypoglycemia  then we can potentially stop the glipizide and increase her dose  of jardiance. Would hold off on this as she feels her blood  sugars are doing well at this time. Lipids were checked in May,  LDL is at goal. She is compliant to the rosuvastatin. Refills of  her medications have been sent in today.  .  TREATMENT  .  Type 2 diabetes mellitus with hyperglycemia,  without long-term current use of insulin  Refill GlipiZIDE XL Tablet Extended Release 24 Hour, 5 MG, 1  tablet, Orally, Once a day, 90 days, 90 Tablet, Refills 3  Refill Glucophage XR Tablet Extended Release 24 Hour, 500 mg, 4  tablets, Orally, Once a day, 90 days, 360 Tablet, Refills 3  Refill Jardiance Tablet, 10 MG, 1 tablet, Orally, Once a day, 90  days, 90 Tablet, Refills 3  Refill Lisinopril Tablet, 2.5 MG, 1 tablet, Orally, Once a day,  90 days, 90 Tablet, Refills 3  .  .  Other hyperlipidemia  Refill Rosuvastatin Calcium Tablet, 10 mg, 1 tablet, Orally, Once  a day, 90 days, 90 Tablet, Refills 3  .  .  Others  Notes: I have reviewed the findings, assessment and treatment  plan with [name], NP and have  edited Progress Note as  required. Sugars are in good range. No change in Rx. Monitor for  hypoglycemia.  [name]  PROCEDURE CODES  .  16109 PHONE E/M BY PHYS 21-30 MIN, Modifiers: GT , SA  .  FOLLOW UP  .  6 Months  .  [name]  Appointment Provider: [name], NP  .  Electronically signed by Paulla Dolly , MD on  09/08/2018 at 09:48 PM EDT  .  CONFIRMATORY SIGN OFF  .  [name]  Document electronically signed by [name]    .

## 2018-09-06 NOTE — Progress Notes (Signed)
* * *      Pfohl, Lasha **DOB:** February 14, 1961 (58 yo F) **Acc No.** [id number] **DOS:**  09/06/2018    ---       [name], Leiya**    ------    45 Y old Female, DOB: 1960-08-02    [address], [place], [place] [address]    Home: [phone]    Provider: [name]        * * *    Telephone Encounter    ---    Answered by  [name] Date: 09/06/2018       Time: 02:18 PM    Reason  check out    ------            Message                     Telehealth visit done today, please schedule for f/u with me end of Dec or early January. Thanks                Action Taken                     Kai Railsback  09/06/2018 2:19:00 PM >      Menzies,Marlynda  09/06/2018 5:24:19 PM > patient has been scheduled for December. Mailed out appointment letter                    * * *                ---          * * *         Provider: [name] 09/06/2018    ---    Note generated by eClinicalWorks EMR/PM Software ([email or web address])

## 2018-09-06 NOTE — Progress Notes (Signed)
 * * *    [name], [name] **DOB:** 1960/12/19 (58 yo F) **Acc No.** [id number] **DOS:**  09/06/2018    ---       [name], [name]**    ------    [name], DOB: July 06, 1960, External MRN: [id number]    Account Number: [id number]    [address], [place], [id number]    Home: [phone]    Guarantor: [name] Insurance: [id number] [id number] PPO    PCP: [name], MD Referring: [name], MD External Visit ID: [id number]    Appointment Facility: Endocrinology        * * *    09/06/2018  **Appointment Provider:** [name], NP **CHN#:** [id number]    ------     **Supervising Provider:** Paulla Dolly, MD    ---       **Current Medications**    ---    Taking    * Baclofen 10 MG Tablet 1/2 tab Orally prn    ---    * Calcium Carbonate-Vitamin D 600-200 MG-UNIT Capsule 1 capsule with a meal Orally Twice a day    ---    * Famotidine 40 MG Tablet 1 tablet at bedtime Orally Once a day    ---    * Gammagard - Solution as directed Injection once every 4 weeks    ---    * GlipiZIDE XL 5 MG Tablet Extended Release 24 Hour 1 tablet Orally Once a day    ---    * Glucophage XR 500 mg Tablet Extended Release 24 Hour 4 tablets Orally Once a day    ---    * Hydroxychloroquine Sulfate 200 mg Tablet 1 tablet with food or milk Orally Once a day    ---    * Jardiance 10 MG Tablet 1 tablet Orally Once a day    ---    * Lisinopril 2.5 MG Tablet 1 tablet Orally Once a day    ---    * Omeprazole 40 mg Capsule Delayed Release 1 capsule Orally Once a day    ---    * PredniSONE 10 MG Tablet TAKE 2 TABLETS BY MOUTH ONCE A DAY     ---    * Rosuvastatin Calcium 10 MG Tablet 1 tablet Orally Once a day    ---    * Sulindac 150 mg Tablet 1 tablet with food Orally Twice a day    ---    * Tramadol HCl 50 MG Tablet 1 tablet Orally every 6 hrs    ---    * Voltaren 1 % Gel 2 grams Transdermal twice daily    ---    * Medication List reviewed and reconciled with the patient    ---     Past Medical History    ---      Scleroderma - CREST dx 2007.        ---     IgG4 deficiency s/p IVIG 2010 ----single infusion given preventively after  week of bilateral knee replacements at [facility] 2010.        ---    Fx left wrist in 1994.        ---    Blood clot at LUE in 2006 on a short course of Coumadin.        ---    Bilateral carpal tunnel syndrome s/p Lt carpal tunnel release and steroids  injection right--.        ---    Bone spur left foot.        ---  Scoliosis.        ---    Spinal stenosis s/p steroids injection.        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 Katrinka Blazing 2010--- never infected, but packed with antibiotics with  surgery;.        ---    Right rotator cuff repairs x 4, complicated by repeat tears, infection,  placement of anchor material.        ---    Elevated liver function tests.        ---    Diabetes.        ---      **Surgical History**    ---      Right rotator cuff repair, with infected hardware that had to be removed  2006    ---    Repeat right shoulder surgery, also which became infected. 2007    ---    Left rotator cuff repair 2008    ---    bilateral knee replacements 2009    ---    Left arthroscopic carpal tunnel release 01/2011    ---    ORIF Left 4th metatarsal bone    ---    Left Ulna shortening 1994    ---    Knee replacement 2010    ---    Hand surgery 2013, 2015    ---    remove gallbladder/hernia 1990    ---    Plate left foot 3rd metatarsal 2008    ---      **Family History**    ---      Mother: deceased 67 yrs, lung cancer, hyperthyroidism, diagnosed with Other  malignant neoplasm of unspecified site    ---    Father: deceased 50s yrs, heart attack, Unspecified heart disease    ---    3 brother(s) .    ---    FH of arthritis and scleroderma\nFather deceased from MI\nMother deceased age  62 with lung cancer \\nBrother  with scleroderma \/ copd.    ---      **Social History**    ---    Tobacco history: Never smoked.    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    Alcohol    _Former  daily EtOH use in 20s_  Nonsmoker.    Lives with longstanding boyfriend.    ---      **Hospitalization/Major Diagnostic Procedure**    ---      as above    ---     **Review of Systems**    ---     _Diabetes_ :    General: no unintended weight change, no fevers, malaise, weakness. Eyes: no  vision loss. Cardiovascular: no CP, no orthopnea, no palpitations.  Respiratory: no wheezing, no dyspnea. Gastrointestinal: no n/v, no abdominal  pain. Musculoskeletal: no edema. Skin: no rash. Neurological: no numbness.          **Reason for Appointment**    ---      1\. Phone call: [phone]    ---    2\. Followup for diabetes    ---      **History of Present Illness**    ---     _General_ :    The patient encounter today occurred via Telehealth with the patient's verbal  consent.    The reason for the tele-health visit was due to the COVID-19 pandemic crisis /  federally declared state of public health emergency, and need for social  distancing.  The patient denies having any known contacts with people diagnosed  with COVID-19 or any signs of active infection with COVID-19. The patient is  reasonably trying to maintain social distancing measures.    .    Method of Telehealth used was a real-time, interactive, secure and private  audio    .    Physical location of the patient was at home.    [name]    Physical location of the provider was at home    ------    58 y/o female with scleroderma (on prednisone), IgG-4 deficiency, NASH and  prior uncontrolled T2DM. Last endo visit was in January. She had a recent  hemoglobin a1c (pt docs) from outside [facility]. A1c was 7.3% (08/19/18).    Diabetes Meds:    glipizide 5mg     metformin 2000mg  daily    jardiance 10mg  once daily    _ __ _    reports hypoglycemia 40-60's range on a higher dose of glipizide 10mg .    A1c Trend:    08/19/18-----7.3%    04/12/18-----7.5%    01/04/18-----7.7%    09/14/17-----10%    03/02/16-----8.7%    08/05/15-----11%    11/04/14-----7.1%    12/31/13-----7%     05/06/13-----6.6%    01/04/18-----7.7%    09/14/17-----10%    03/02/16-----8.7%    08/05/15-----11%.    Recent Blood Glucoses:    unable to download glucometer today    denies lows    -----    meter downloaded onto [employer] (04/12/18)    30 day bg average= 145 +/- 63    83% in range (70-180), 17% above range (>180)    limited bg readings.    Diet:    3 meals daily, low CHO    working on reducing fat/cho portions--seen by Nutrition 04/2016 .    Exercise:    walks 1-2 mi daily with dog , .    Microvascular Complications Screening:    eyes: no acute vision changes, no hx of retinopathy, reports annual exam with  ophthalmologist is scheduled    kidneys: creat:0.91, egfr 71 (09/14/17), **uACR:137 (06/2017)**    feet: denies numbess or parethesias in feet, seen by Dr. Vassie Loll- hammering of  the lesser toes, given rx for custom orthotics , .    Macrovascular Complications Screening:    (+) HLD    on rosuvastatin    TChol: 169, TG 117, HDL:88, LDL:58 (08/19/18).     **Physical Examination**    ---    Total time spent counseling ~25 minutes. The entire visit was spent on  counseling for diabetes, including but not limited to medication therapy,  blood glucose monitoring, laboratory testing, lifestyle measures and follow up  plan.      **Assessments**    ---    1\. Type 2 diabetes mellitus with hyperglycemia, without long-term current use  of insulin - E11.65 (Primary)    ---    2\. Other hyperlipidemia - E78.4    ---    3\. Obesity (BMI 30-39.9) - E66.9    ---     Ms. Mendia has a history of obesity, hypothyroidism, scleroderma, hld and  uncontrolled T2DM. Her most recent hemoglobin a1c is 7.3% (08/19/18) on her  usual medication regimen. She continues to deny hypoglycemia. Discussed that  if she is experiencing hypoglycemia then we can potentially stop the glipizide  and increase her dose of jardiance. Would hold off on this as she feels her  blood sugars are doing well at this time.  Lipids were checked in May, LDL is at  goal. She is compliant to the  rosuvastatin. Refills of her medications have been sent in today.    ---      **Treatment**    ---      **1\. Type 2 diabetes mellitus with hyperglycemia, without long-term  current use of insulin**    Refill GlipiZIDE XL Tablet Extended Release 24 Hour, 5 MG, 1 tablet, Orally,  Once a day, 90 days, 90 Tablet, Refills 3    Refill Glucophage XR Tablet Extended Release 24 Hour, 500 mg, 4 tablets,  Orally, Once a day, 90 days, 360 Tablet, Refills 3    Refill Jardiance Tablet, 10 MG, 1 tablet, Orally, Once a day, 90 days, 90  Tablet, Refills 3    Refill Lisinopril Tablet, 2.5 MG, 1 tablet, Orally, Once a day, 90 days, 90  Tablet, Refills 3    ---         **2\. Other hyperlipidemia**    Refill Rosuvastatin Calcium Tablet, 10 mg, 1 tablet, Orally, Once a day, 90  days, 90 Tablet, Refills 3         **3\. Others**    Notes: I have reviewed the findings, assessment and treatment plan with [name], NP and have edited Progress Note as required. Sugars are in good range.  No change in Rx. Monitor for hypoglycemia.     **Procedure Codes**    ---      [phone] PHONE E/M BY PHYS 21-30 MIN, Modifiers: GT , SA    ---      **Follow Up**    ---    6 Months    **Appointment Provider:** [name], NP    Electronically signed by Paulla Dolly , MD on 09/08/2018 at 09:48 PM EDT    Sign off status: Completed        * * *        Endocrinology    [address]    [place], [address]    [place], [place] [address]    Tel: [phone]    Fax: [phone]              * * *          Progress Note: [name], NP 09/06/2018    ---    Note generated by eClinicalWorks EMR/PM Software ([email or web address])

## 2018-09-11 ENCOUNTER — Ambulatory Visit

## 2018-09-13 LAB — UNMAPPED LAB RESULTS: BUN (EXT): 23 mg/dL — ABNORMAL HIGH (ref 7–20)

## 2018-09-16 ENCOUNTER — Ambulatory Visit

## 2018-09-18 ENCOUNTER — Ambulatory Visit

## 2018-09-30 ENCOUNTER — Ambulatory Visit

## 2018-09-30 NOTE — Telephone Encounter (Signed)
 Call Details:   Patient PCP = [name], MD  [name] (Patient) called on September 30, 2018 3:46 PM.  Message taken by: Bartholomew Crews  Primary call-back number: [phone]    Secondary call-back number: [phone] -    Call Reason(s): Message/Call-Back      ** MESSAGE / CALL-BACK.  Regarding: Pt stated that she hasnt received email with FMLA. Please contact the pt regarding this matter    ---------- ---------- ---------- ---------- ---------- ----------       RESPONSE/ORDERS:  Spoke with patient all set. 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)  DIABETES MELLITUS, TYPE II (ICD-250.00) (ICD10-E11.9)  LONG-TERM (CURRENT) USE OF STEROIDS (ICD-V58.65) (ICD10-Z79.51)  SKIN SAGGING DUE TO WEIGHT LOSS (ICD-757.9) (ICD10-Q84.9)  TRANSAMINASES, SERUM, ELEVATED (ICD-790.4) (ICD10-R74.0)  OBESITY, BMI 30-34.9, ADULT (ICD-278.00) (ICD10-E66.9)      DIABETES MELLITUS, TYPE II, UNCONTROLLED (ICD-250.02) (ICD10-E11.65)      FATTY LIVER DISEASE (ICD-571.8) (ICD10-K76.0)  SCLERODERMA, 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)  HYPOTHYROIDISM (ICD-244.9) (ICD10-E03.9)  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) (ICD10-D80.8)  SPINAL STENOSIS, 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)  HEPATITIS C EXPOSURE (HCV RNA NEGATIVE, 01/2014) (ICD-V02.62) (ICD10-Z20.5)  PAIN IN JOINT, HAND (ICD-719.44) (ICD10-M79.643)  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)  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)  CARPAL TUNNEL (ICD-354.0)  (ICD10-G56.00)  OSTEOARTHRITIS (ICD-715.09) (ICD10-M15.9)  S/P ROTATOR CUFF SURGERY 12/2004 - RT SHOULDER; 2008 LT (ICD-V45.89)  Family Hx of MELANOMA, FAMILY HX (ICD-V16.8) (ICD10-Z80.8)  FRACTURE OF OTHER SPEC SITE,  PATHOLOGIC - MULTIPLE (ICD-733.19)  CHOLECYSTECTOMY AND HERNIA REPAIR (ICD-V45.89)  VITAMIN D DEFICIENCY (ICD-268.9) (ICD10-E55.9)  COLONOSCOPY, NEXT 2020- SEE COMMENT (ICD-V76.51) (ICD10-Z01.89)    Meds (prior to this call):   HYDROXYCHLOROQUINE SULFATE 200 MG ORAL TABLET (HYDROXYCHLOROQUINE SULFATE) one tablet oral daily; Route: ORAL  JARDIANCE 10 MG ORAL TABLET (EMPAGLIFLOZIN) Take 1 tablet by mouth once daily; Route: ORAL  ROSUVASTATIN 10MG  TABLETS (ROSUVASTATIN CALCIUM) TAKE 1 TABLET BY MOUTH EVERY DAY  LISINOPRIL 2.5MG  TABLETS (LISINOPRIL) TAKE 1 TABLET BY MOUTH ONCE DAILY  METFORMIN HCL ER 500 MG XR24H-TAB (METFORMIN HCL) TAKE 4 TABLETS BY MOUTH EVERY MORNING  GLIPIZIDE ER 2.5 MG XR24H-TAB (GLIPIZIDE) TAKE 1 TABLET BY MOUTH DAILY  PREDNISONE 10 MG ORAL TABLET (PREDNISONE) Take 1 tablet by mouth once daily; Route: ORAL  OMEPRAZOLE 20 MG ORAL CAPSULE DELAYED RELEASE (OMEPRAZOLE) Take one capsule by mouth twice a day; Route: ORAL  IBUPROFEN 600 MG ORAL TABLET (IBUPROFEN) 1 tab by mouth TID as needed for pain  BACLOFEN 10MG  TABLETS (BACLOFEN) TAKE 1/2 TABLET BY MOUTH EVERY DAY AS NEEDED FOR BACK OR SPASMS  DICLOFENAC SODIUM 1 % TRANSDERMAL GEL (DICLOFENAC SODIUM) APP 2 GRAMS EXT AA BID      FREESTYLE FREEDOM LITE w/Device KIT (BLOOD GLUCOSE MONITORING SUPPL) use as directed (ICD 10 E11.65)      FREESTYLE LITE BLOOD GLUCOSE STRIPS (GLUCOSE BLOOD) USE TO CHECK BLOOD GLUCOSE THREE TIMES DAILY      FREESTYLE LANCETS (LANCETS) check fingerstick three times a day (ICD 10 E11.65)  TRAMADOL HCL 50 MG ORAL TABLET (TRAMADOL HCL) take one tablet  daily as needed for pain; Route: ORAL  OXYCODONE HCL 5 MG ORAL TABLET (OXYCODONE HCL) Partial fill upon patient request.  Take one tablet daily as needed for pain >8;  Route: ORAL  CALCIUM CARBONATE-VITAMIN D 600-200 MG-UNIT ORAL TABLET (CALCIUM CARBONATE-VITAMIN D) take one tablet twice a day; Route: ORAL  GABAPENTIN 300MG  CAPSULES (GABAPENTIN) TAKE 1 CAPSULE BY MOUTH EVERY NIGHT AT BEDTIME AS NEEDED FOR PAIN  FAMOTIDINE 20 MG ORAL TABLET (FAMOTIDINE) ; Route: ORAL  * BLOODWORK Please draw chemistry (sodium, potassium, magnesium, chloride, bicarbonate, glucose, creatinine, BUN), CBC, TSH, lipids, HbA1c. Fax result to [phone] Dr. Marlane Hatcher            Created By Bartholomew Crews on 09/30/2018 at 03:46 PM    Electronically Signed By [name], MD on 09/30/2018 at 04:09 PM

## 2018-10-03 ENCOUNTER — Ambulatory Visit

## 2018-10-03 MED ORDER — BACLOFEN: 0.5 | 20 | 1 refills | 0 days | Status: AC

## 2018-10-03 MED ORDER — GABAPENTIN: 1 | 30 | 1 refills | 0 days | Status: AC

## 2018-10-03 MED ORDER — PredniSONE: 10 | 60 | 0 refills | 0 days | Status: AC

## 2018-10-07 ENCOUNTER — Ambulatory Visit

## 2018-10-07 NOTE — Telephone Encounter (Signed)
 Call Details:   Patient PCP = [name], MD  [name] (Patient) called on October 07, 2018 10:59 AM.  Message taken by: Milinda Hirschfeld  Primary call-back number: [phone]    Secondary call-back number: () -    Call Reason(s): Message/Call-Back      ** MESSAGE / CALL-BACK.  Regarding: pt is calling to speak to ewa to keep appt as a televisit or virtual. if there is anything earlier please let pt know.     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) (ICD10-M47.812)  NECK PAIN (ICD-723.1) (ICD10-M54.2)  STEROID USE, LONG TERM (ICD-V58.65) (ICD10-Z79.52)  PREMATURE VENTRICULAR CONTRACTIONS (ICD-427.69) (ICD10-I49.3)  HYPERLIPIDEMIA (ICD-272.4) (ICD10-E78.5)  BACK PAIN, LUMBAR, CHRONIC (ICD-724.2) (ICD10-M54.5)  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)  DIABETES MELLITUS, TYPE II (ICD-250.00) (ICD10-E11.9)  LONG-TERM (CURRENT) USE OF STEROIDS (ICD-V58.65) (ICD10-Z79.51)  SKIN SAGGING DUE TO WEIGHT LOSS (ICD-757.9) (ICD10-Q84.9)  TRANSAMINASES, SERUM, ELEVATED (ICD-790.4) (ICD10-R74.0)  OBESITY, BMI 30-34.9, ADULT (ICD-278.00) (ICD10-E66.9)      DIABETES MELLITUS, TYPE II, UNCONTROLLED (ICD-250.02) (ICD10-E11.65)      FATTY LIVER DISEASE (ICD-571.8) (ICD10-K76.0)  SCLERODERMA, 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)  HYPOTHYROIDISM (ICD-244.9) (ICD10-E03.9)  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) (ICD10-D80.8)  SPINAL STENOSIS, 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)  HEPATITIS C EXPOSURE (HCV RNA NEGATIVE, 01/2014) (ICD-V02.62) (ICD10-Z20.5)  PAIN IN JOINT, HAND (ICD-719.44) (ICD10-M79.643)  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)  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)  (ICD10-D14.30)  CARPAL TUNNEL (ICD-354.0) (ICD10-G56.00)  OSTEOARTHRITIS (ICD-715.09) (ICD10-M15.9)  S/P ROTATOR CUFF SURGERY 12/2004 - RT SHOULDER; 2008 LT (ICD-V45.89)  Family Hx of MELANOMA, FAMILY HX (ICD-V16.8) (ICD10-Z80.8)  FRACTURE OF OTHER SPEC SITE,  PATHOLOGIC - MULTIPLE (ICD-733.19)  CHOLECYSTECTOMY AND HERNIA REPAIR (ICD-V45.89)  VITAMIN D DEFICIENCY (ICD-268.9) (ICD10-E55.9)  COLONOSCOPY, NEXT 2020- SEE COMMENT (ICD-V76.51) (ICD10-Z01.89)    Meds (prior to this call):   HYDROXYCHLOROQUINE SULFATE 200 MG ORAL TABLET (HYDROXYCHLOROQUINE SULFATE) one tablet oral daily; Route: ORAL  JARDIANCE 10 MG ORAL TABLET (EMPAGLIFLOZIN) Take 1 tablet by mouth once daily; Route: ORAL  ROSUVASTATIN 10MG  TABLETS (ROSUVASTATIN CALCIUM) TAKE 1 TABLET BY MOUTH EVERY DAY  LISINOPRIL 2.5MG  TABLETS (LISINOPRIL) TAKE 1 TABLET BY MOUTH ONCE DAILY  METFORMIN HCL ER 500 MG XR24H-TAB (METFORMIN HCL) TAKE 4 TABLETS BY MOUTH EVERY MORNING  GLIPIZIDE ER 2.5 MG XR24H-TAB (GLIPIZIDE) TAKE 1 TABLET BY MOUTH DAILY  PREDNISONE 10 MG ORAL TABLET (PREDNISONE) Take 1 tablet by mouth once daily; Route: ORAL  OMEPRAZOLE 20 MG ORAL CAPSULE DELAYED RELEASE (OMEPRAZOLE) Take one capsule by mouth twice a day; Route: ORAL  IBUPROFEN 600 MG ORAL TABLET (IBUPROFEN) 1 tab by mouth TID as needed for pain  BACLOFEN 10MG  TABLETS (BACLOFEN) TAKE 1/2 TABLET BY MOUTH EVERY DAY AS NEEDED FOR BACK OR SPASMS  DICLOFENAC SODIUM 1 % TRANSDERMAL GEL (DICLOFENAC SODIUM) APP 2 GRAMS EXT AA BID      FREESTYLE FREEDOM LITE w/Device KIT (BLOOD GLUCOSE MONITORING SUPPL) use as directed (ICD 10 E11.65)      FREESTYLE LITE BLOOD GLUCOSE STRIPS (GLUCOSE BLOOD) USE TO CHECK BLOOD GLUCOSE THREE TIMES DAILY      FREESTYLE LANCETS (LANCETS) check fingerstick three times a day (ICD 10 E11.65)  TRAMADOL HCL 50 MG ORAL TABLET (TRAMADOL HCL) take one tablet daily as needed for pain; Route: ORAL  OXYCODONE HCL 5 MG ORAL TABLET (OXYCODONE HCL) Partial fill upon patient request.   Take one tablet daily as needed for pain >8; Route: ORAL  CALCIUM CARBONATE-VITAMIN D 600-200 MG-UNIT ORAL TABLET (CALCIUM CARBONATE-VITAMIN D) take one tablet twice a day; Route: ORAL  GABAPENTIN 300MG  CAPSULES (GABAPENTIN) TAKE 1 CAPSULE BY MOUTH EVERY NIGHT AT BEDTIME AS NEEDED FOR PAIN  FAMOTIDINE 20 MG ORAL TABLET (FAMOTIDINE) ; Route: ORAL  * BLOODWORK Please draw chemistry (sodium, potassium, magnesium, chloride, bicarbonate, glucose, creatinine, BUN), CBC, TSH, lipids, HbA1c. Fax result to [phone] Dr. Marlane Hatcher            Created By Milinda Hirschfeld on 10/07/2018 at 10:59 AM    Electronically Signed By Everlene Farrier on 10/07/2018 at 11:15 AM

## 2018-10-11 ENCOUNTER — Ambulatory Visit

## 2018-10-11 ENCOUNTER — Ambulatory Visit: Admitting: Internal Medicine

## 2018-10-11 ENCOUNTER — Ambulatory Visit: Admit: 2018-10-11

## 2018-10-11 MED ORDER — OXYCODONE HCL: 1 | 28 | 0 refills | 0 days | Status: AC

## 2018-10-11 NOTE — Telephone Encounter (Signed)
 General Medicine Tele-Medicine Visit  This visit is done remotely with myself and this patient.  Patient presents during the COVID-19 pandemic / federally declared state of public health emergency.  Visit Type: Audio Visit Only  Patient consent for audio or video type visit:  Yes  Patient does not have access to video visit:  Yes  Chief Complaint:   f/up MMP    History of Present Illness:   Seeing her specialists regularly. Got an injection in her wrist. A1c down to 7.1.                Family History: (reviewed)   Father: DMII, died of MI at 42  Mom: died of lung CA at 9  MGM and PGM: lung ca (smokers)  MGF and several other second degree relatives: brain aneurysms in their 65's.  2 maternal uncles:melanoma (one died of unknown cancer, one living)  brother recently diagnosed with systemic scleroderma    Social History: (reviewed)   Non-smoker, rare etoh, no IVDU. 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 Living together with her lifelong partner of ~20 years - may be getting legally married to him soon. Sexually active with husband only. No children. Always wears a seat belt.      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)  DIABETES MELLITUS, TYPE II (ICD-250.00) (ICD10-E11.9)  LONG-TERM (CURRENT) USE OF STEROIDS (ICD-V58.65) (ICD10-Z79.51)  SKIN SAGGING DUE TO WEIGHT LOSS (ICD-757.9) (ICD10-Q84.9)  TRANSAMINASES, SERUM, ELEVATED (ICD-790.4) (ICD10-R74.0)  OBESITY, BMI 30-34.9, ADULT (ICD-278.00) (ICD10-E66.9)      DIABETES MELLITUS,  TYPE II, UNCONTROLLED (ICD-250.02) (ICD10-E11.65)      FATTY LIVER DISEASE (ICD-571.8) (ICD10-K76.0)  SCLERODERMA, 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)  HYPOTHYROIDISM (ICD-244.9) (ICD10-E03.9)  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) (ICD10-D80.8)  SPINAL STENOSIS, 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)  HEPATITIS C EXPOSURE (HCV RNA NEGATIVE, 01/2014) (ICD-V02.62) (ICD10-Z20.5)  PAIN IN JOINT, HAND (ICD-719.44) (ICD10-M79.643)  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)  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)  CARPAL TUNNEL (ICD-354.0) (ICD10-G56.00)  OSTEOARTHRITIS (ICD-715.09) (ICD10-M15.9)  S/P ROTATOR CUFF SURGERY 12/2004 - RT SHOULDER; 2008 LT (ICD-V45.89)  Family Hx of MELANOMA, FAMILY HX (ICD-V16.8) (ICD10-Z80.8)  FRACTURE OF OTHER SPEC SITE,  PATHOLOGIC - MULTIPLE (ICD-733.19)  CHOLECYSTECTOMY AND HERNIA REPAIR (ICD-V45.89)  VITAMIN D DEFICIENCY (ICD-268.9) (ICD10-E55.9)  COLONOSCOPY, NEXT 2020- SEE COMMENT (ICD-V76.51) (ICD10-Z01.89)       Problems Reviewed:  Done      Medications (prior to today's visit):  HYDROXYCHLOROQUINE SULFATE 200 MG ORAL TABLET (HYDROXYCHLOROQUINE SULFATE) one tablet oral daily; Route: ORAL  JARDIANCE 10 MG ORAL TABLET (EMPAGLIFLOZIN) Take 1 tablet by mouth once daily; Route: ORAL  ROSUVASTATIN 10MG  TABLETS (ROSUVASTATIN CALCIUM) TAKE 1 TABLET BY MOUTH EVERY DAY  LISINOPRIL 2.5MG  TABLETS (LISINOPRIL) TAKE 1 TABLET BY MOUTH ONCE DAILY  METFORMIN HCL ER 500 MG XR24H-TAB (METFORMIN HCL) TAKE 4 TABLETS BY MOUTH EVERY MORNING  GLIPIZIDE ER 2.5 MG XR24H-TAB (GLIPIZIDE) TAKE 1 TABLET BY MOUTH DAILY  PREDNISONE 10 MG ORAL TABLET (PREDNISONE) Take 1 tablet by mouth once daily; Route: ORAL  OMEPRAZOLE 20 MG ORAL CAPSULE DELAYED RELEASE (OMEPRAZOLE) Take  one capsule by mouth twice a day; Route: ORAL  IBUPROFEN 600 MG ORAL TABLET (IBUPROFEN) 1 tab by mouth TID as needed for pain  BACLOFEN 10MG  TABLETS (BACLOFEN) TAKE 1/2 TABLET BY MOUTH EVERY DAY AS NEEDED FOR BACK OR  SPASMS  DICLOFENAC SODIUM 1 % TRANSDERMAL GEL (DICLOFENAC SODIUM) APP 2 GRAMS EXT AA BID      FREESTYLE FREEDOM LITE w/Device KIT (BLOOD GLUCOSE MONITORING SUPPL) use as directed (ICD 10 E11.65)      FREESTYLE LITE BLOOD GLUCOSE STRIPS (GLUCOSE BLOOD) USE TO CHECK BLOOD GLUCOSE THREE TIMES DAILY      FREESTYLE LANCETS (LANCETS) check fingerstick three times a day (ICD 10 E11.65)  TRAMADOL HCL 50 MG ORAL TABLET (TRAMADOL HCL) take one tablet daily as needed for pain; Route: ORAL  OXYCODONE HCL 5 MG ORAL TABLET (OXYCODONE HCL) Partial fill upon patient request.  Take one tablet daily as needed for pain >8; Route: ORAL  CALCIUM CARBONATE-VITAMIN D 600-200 MG-UNIT ORAL TABLET (CALCIUM CARBONATE-VITAMIN D) take one tablet twice a day; Route: ORAL  GABAPENTIN 300MG  CAPSULES (GABAPENTIN) TAKE 1 CAPSULE BY MOUTH EVERY NIGHT AT BEDTIME AS NEEDED FOR PAIN  FAMOTIDINE 20 MG ORAL TABLET (FAMOTIDINE) ; Route: ORAL  * BLOODWORK Please draw chemistry (sodium, potassium, magnesium, chloride, bicarbonate, glucose, creatinine, BUN), CBC, TSH, lipids, HbA1c. Fax result to [phone] Dr. Marlane Hatcher    Medications (after today's visit):  HYDROXYCHLOROQUINE SULFATE 200 MG ORAL TABLET (HYDROXYCHLOROQUINE SULFATE) one tablet oral daily; Route: ORAL  JARDIANCE 10 MG ORAL TABLET (EMPAGLIFLOZIN) Take 1 tablet by mouth once daily; Route: ORAL  ROSUVASTATIN 10MG  TABLETS (ROSUVASTATIN CALCIUM) TAKE 1 TABLET BY MOUTH EVERY DAY  LISINOPRIL 2.5MG  TABLETS (LISINOPRIL) TAKE 1 TABLET BY MOUTH ONCE DAILY  METFORMIN HCL ER 500 MG XR24H-TAB (METFORMIN HCL) TAKE 4 TABLETS BY MOUTH EVERY MORNING  GLIPIZIDE ER 2.5 MG XR24H-TAB (GLIPIZIDE) TAKE 1 TABLET BY MOUTH DAILY  PREDNISONE 10 MG ORAL TABLET (PREDNISONE) Take 1 tablet by mouth once daily; Route: ORAL  OMEPRAZOLE 20 MG ORAL CAPSULE DELAYED RELEASE (OMEPRAZOLE) Take one capsule by mouth twice a day; Route: ORAL  IBUPROFEN 600 MG ORAL TABLET (IBUPROFEN) 1 tab by mouth TID as needed for pain  BACLOFEN 10MG   TABLETS (BACLOFEN) TAKE 1/2 TABLET BY MOUTH EVERY DAY AS NEEDED FOR BACK OR SPASMS  DICLOFENAC SODIUM 1 % TRANSDERMAL GEL (DICLOFENAC SODIUM) APP 2 GRAMS EXT AA BID      FREESTYLE FREEDOM LITE w/Device KIT (BLOOD GLUCOSE MONITORING SUPPL) use as directed (ICD 10 E11.65)      FREESTYLE LITE BLOOD GLUCOSE STRIPS (GLUCOSE BLOOD) USE TO CHECK BLOOD GLUCOSE THREE TIMES DAILY      FREESTYLE LANCETS (LANCETS) check fingerstick three times a day (ICD 10 E11.65)  TRAMADOL HCL 50 MG ORAL TABLET (TRAMADOL HCL) take one tablet daily as needed for pain; Route: ORAL  OXYCODONE HCL 5 MG ORAL TABLET (OXYCODONE HCL) Take one tablet daily as needed for pain >8. Partial fill upon patient request.; Route: ORAL  CALCIUM CARBONATE-VITAMIN D 600-200 MG-UNIT ORAL TABLET (CALCIUM CARBONATE-VITAMIN D) take one tablet twice a day; Route: ORAL  GABAPENTIN 300MG  CAPSULES (GABAPENTIN) TAKE 1 CAPSULE BY MOUTH EVERY NIGHT AT BEDTIME AS NEEDED FOR PAIN  FAMOTIDINE 20 MG ORAL TABLET (FAMOTIDINE) ; Route: ORAL  * BLOODWORK Please draw chemistry (sodium, potassium, magnesium, chloride, bicarbonate, glucose, creatinine, BUN), CBC, TSH, lipids, HbA1c. Fax result to [phone] Dr. Marlane Hatcher       Medications Reviewed:  Done      No Known Allergies  Allergies Reviewed:  Done          Vitals from Pt:   Ht: 62.5 in.            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 Recommended Shingrix.    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. Chronic prednisone use is ongoing. continue Ca+Vit D and omeprazole. DEXA 2017 showed normal bone mass; having repeat DEXA 04/12/2018.     OBESITY (ICD-278.00) (ICD10-E66.9) Has lost 3 more pounds since last visit. Congratulated her on weight loss efforts through diet: reviewed with her that since 2017 to now 2019, she has gone from BMI 39 to 30.    HYPERLIPIDEMIA (ICD-272.4) (ICD10-E78.5) Continue rosuvastatin. 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 in Rheum. Starting Orencia, still on prednisone, as above.    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 PT, acupuncture as above       I spent 15 minutes with the patient counseling and treating the above conditions.  Location of Provider:  Home  Location of Patient:  Home  Names of Participants for Visit: patient and physician    Orders (this visit):  Telephone Call Intermediate (11-20 min) [[id number]]        Patient Care Plan      Immunization Worksheet 2019 (rev 05/16/2018)                             Follow-up With:           Created By [name], MD on 10/11/2018 at 08:36 AM    Electronically Signed By [name], MD on 10/11/2018 at 09:06 AM

## 2018-10-11 NOTE — Progress Notes (Signed)
 Checkout  Return to Clinic No RTC  Appointment Made No          Created By Everlene Farrier on 10/11/2018 at 09:32 AM    Electronically Signed By Everlene Farrier on 10/11/2018 at 09:33 AM

## 2018-10-11 NOTE — Progress Notes (Signed)
 General Medicine Tele-Medicine Visit  This visit is done remotely with myself andthis patient.  Patient presents during the COVID-19 pandemic / federally declared state of   public health emergency.  Visit Type: Audio Visit Only  Patient consent for audio or video type visit:  Yes  Patient does not have access to video visit:  Yes  Chief Complaint:   f/up MMP  .  History of Present Illness:   Seeing her specialists regularly. Got an injection in her wrist. A1c down t  o 7.1.  .  .  .  .  .  .  .  Family History: (reviewed)   Father: DMII, died of MI at 32  Mom: died of lung CA at 79  MGM and PGM: lung ca (smokers)  MGF and several other second degree relatives: brain aneurysms in their 44'  s.  2 maternal uncles:melanoma (one died of unknown cancer, one living)  brother recently diagnosed with systemic scleroderma  .  Social History: (reviewed)   Non-smoker, rare etoh, no IVDU. 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 Living together with her lifelong partner of /  R/20 years - may be getting legally married to him soon. Sexually active wi  th husband only. No children. Always wears a seat belt.  .  .  Please refer to HPI for ROS  .  .  .  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)  DIABETES MELLITUS, TYPE II (ICD-250.00) (ICD10-E11.9)  LONG-TERM (CURRENT) USE OF STEROIDS (ICD-V58.65) (ICD10-Z79.51)  SKIN SAGGING DUE TO WEIGHT LOSS (ICD-757.9) (ICD10-Q84.9)  TRANSAMINASES, SERUM, ELEVATED (ICD-790.4) (ICD10-R74.0)  OBESITY, BMI 30-34.9, ADULT (ICD-278.00)  (ICD10-E66.9)      DIABETES MELLITUS, TYPE II, UNCONTROLLED (ICD-250.02) (ICD10-E11.65)      FATTY LIVER DISEASE (ICD-571.8) (ICD10-K76.0)  SCLERODERMA, 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)  HYPOTHYROIDISM (ICD-244.9) (ICD10-E03.9)  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) (ICD10-D  80.8)  SPINAL STENOSIS, 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)  HEPATITIS C EXPOSURE (HCV RNA NEGATIVE, 01/2014) (ICD-V02.62) (ICD10-Z20.5)  PAIN IN JOINT, HAND (ICD-719.44) (ICD10-M79.643)  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  Problems Reviewed:  Done  .  [name]  Medications (prior to today's visit):  HYDROXYCHLOROQUINE SULFATE 200 MG ORAL TABLET (HYDROXYCHLOROQUINE SULFATE)   one tablet oral daily; Route: ORAL  JARDIANCE 10 MG ORAL TABLET (EMPAGLIFLOZIN) Take 1 tablet by mouth once dai  ly; Route: ORAL  ROSUVASTATIN 10MG  TABLETS (ROSUVASTATIN CALCIUM) TAKE 1 TABLET BY MOUTH EVE  RY DAY  LISINOPRIL 2.5MG  TABLETS (LISINOPRIL) TAKE 1 TABLET BY MOUTH ONCE DAILY  METFORMIN HCL ER 500 MG XR24H-TAB (METFORMIN HCL) TAKE 4 TABLETS BY MOUTH E  VERY MORNING  GLIPIZIDE ER 2.5 MG XR24H-TAB (GLIPIZIDE) TAKE 1 TABLET BY MOUTH DAILY  PREDNISONE 10 MG ORAL TABLET (PREDNISONE) Take  1 tablet by mouth once daily  ; Route: ORAL  OMEPRAZOLE 20 MG ORAL CAPSULE DELAYED RELEASE (OMEPRAZOLE) Take one capsule   by mouth twice a day; Route: ORAL  IBUPROFEN 600 MG ORAL TABLET (IBUPROFEN) 1 tab by mouth TID as needed for p  ain  BACLOFEN 10MG  TABLETS (BACLOFEN) TAKE  1/2 TABLET BY MOUTH EVERY DAY AS NEED  ED FOR BACK OR SPASMS  DICLOFENAC SODIUM 1 % TRANSDERMAL GEL (DICLOFENAC SODIUM) APP 2 GRAMS EXT A  A BID      FREESTYLE FREEDOM LITE w/Device KIT (BLOOD GLUCOSE MONITORING SUPPL) [place]  e as directed (ICD 10 E11.65)      FREESTYLE LITE BLOOD GLUCOSE STRIPS (GLUCOSE BLOOD) USE TO CHECK BLOOD   GLUCOSE THREE TIMES DAILY      FREESTYLE LANCETS (LANCETS) check fingerstick three times a day (ICD 10   E11.65)  TRAMADOL HCL 50 MG ORAL TABLET (TRAMADOL HCL) take one tablet daily as need  ed for pain; Route: ORAL  OXYCODONE HCL 5 MG ORAL TABLET (OXYCODONE HCL) Partial fill upon patient re  quest.  Take one tablet daily as needed for pain >8; Route: ORAL  CALCIUM CARBONATE-VITAMIN D 600-200 MG-UNIT ORAL TABLET (CALCIUM CARBONATE-  VITAMIN D) take one tablet twice a day; Route: ORAL  GABAPENTIN 300MG  CAPSULES (GABAPENTIN) TAKE 1 CAPSULE BY MOUTH EVERY NIGHT   AT BEDTIME AS NEEDED FOR PAIN  FAMOTIDINE 20 MG ORAL TABLET (FAMOTIDINE) ; Route: ORAL  * BLOODWORK Please draw chemistry (sodium, potassium, magnesium, chloride,   bicarbonate, glucose, creatinine, BUN), CBC, TSH, lipids, HbA1c. Fax result   to [phone] Dr. Marlane Hatcher  .  Medications (after today's visit):  HYDROXYCHLOROQUINE SULFATE 200 MG ORAL TABLET (HYDROXYCHLOROQUINE SULFATE)   one tablet oral daily; Route: ORAL  JARDIANCE 10 MG ORAL TABLET (EMPAGLIFLOZIN) Take 1 tablet by mouth once dai  ly; Route: ORAL  ROSUVASTATIN 10MG  TABLETS (ROSUVASTATIN CALCIUM) TAKE 1 TABLET BY MOUTH EVE  RY DAY  LISINOPRIL 2.5MG  TABLETS (LISINOPRIL) TAKE 1 TABLET BY MOUTH ONCE DAILY  METFORMIN HCL ER 500 MG XR24H-TAB (METFORMIN HCL) TAKE 4 TABLETS BY MOUTH E  VERY MORNING  GLIPIZIDE ER 2.5 MG XR24H-TAB (GLIPIZIDE) TAKE 1 TABLET BY MOUTH DAILY  PREDNISONE 10 MG ORAL TABLET (PREDNISONE) Take 1 tablet by mouth once daily  ; Route: ORAL  OMEPRAZOLE 20 MG ORAL CAPSULE DELAYED RELEASE (OMEPRAZOLE) Take one capsule   by mouth twice a day; Route: ORAL  IBUPROFEN  600 MG ORAL TABLET (IBUPROFEN) 1 tab by mouth TID as needed for p  ain  BACLOFEN 10MG  TABLETS (BACLOFEN) TAKE 1/2 TABLET BY MOUTH EVERY DAY AS NEED  ED FOR BACK OR SPASMS  DICLOFENAC SODIUM 1 % TRANSDERMAL GEL (DICLOFENAC SODIUM) APP 2 GRAMS EXT A  A BID      FREESTYLE FREEDOM LITE w/Device KIT (BLOOD GLUCOSE MONITORING SUPPL) [place]  e as directed (ICD 10 E11.65)      FREESTYLE LITE BLOOD GLUCOSE STRIPS (GLUCOSE BLOOD) USE TO CHECK BLOOD   GLUCOSE THREE TIMES DAILY      FREESTYLE LANCETS (LANCETS) check fingerstick three times a day (ICD 10   E11.65)  TRAMADOL HCL 50 MG ORAL TABLET (TRAMADOL HCL) take one tablet daily as need  ed for pain; Route: ORAL  OXYCODONE HCL 5 MG ORAL TABLET (OXYCODONE HCL) Take one tablet daily as nee  ded for pain >8. Partial fill upon patient request.; Route: ORAL  CALCIUM CARBONATE-VITAMIN D 600-200 MG-UNIT ORAL TABLET (CALCIUM CARBONATE-  VITAMIN D) take one tablet  twice a day; Route: ORAL  GABAPENTIN 300MG  CAPSULES (GABAPENTIN) TAKE 1 CAPSULE BY MOUTH EVERY NIGHT   AT BEDTIME AS NEEDED FOR PAIN  FAMOTIDINE 20 MG ORAL TABLET (FAMOTIDINE) ; Route: ORAL  * BLOODWORK Please draw chemistry (sodium, potassium, magnesium, chloride,   bicarbonate, glucose, creatinine, BUN), CBC, TSH, lipids, HbA1c. Fax result   to [phone] Dr. Marlane Hatcher  .  Medications Reviewed:  Done  .  [name]  No Known Allergies  Allergies Reviewed:  Done  .  .  .  .  Vitals from Pt:   Ht: 62.5 in.    .  .  .  .  Assessment /T/ Plan:   DIABETES MELLITUS, TYPE II (ICD-250.00) (ICD10-E11.9)  Continue care w/ Endocrine  - medications: metformin, glipizide, jardiance  - check FS  - BP control is excellent on 2.5mg  lisinopril  - diabetic eye appt - in 03/2018  - neuropathy/foot exam: seeing new podiatrist outside of [place]  - encourage diet/exercise  - LDL - 110 (01/04/2018)   - UUVO5D - 7/7 (01/04/2018) Went down to 7.5% today  - urine microalbumin - 28 (01/04/2018)  .  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  Recommen  ded Shingrix.  [name]  HYPOTHYROIDISM: Trialing off levothyroxine  (patient would like to re  duce # of meds) Check TSH with next bloodwork  .  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. Ch  ronic prednisone use is ongoing. continue Ca+Vit D and omeprazole. DEXA 201  7 showed normal bone mass; having repeat DEXA 04/12/2018.   [name]  OBESITY (ICD-278.00) (ICD10-E66.9) Has lost 3 more pounds since last visit.   Congratulated her on weight loss efforts through diet: reviewed with her t  hat since 2017 to now 2019, she has gone from BMI 39 to 30.  [name]  HYPERLIPIDEMIA (ICD-272.4) (ICD10-E78.5) Continue rosuvastatin. Lipids chec  ked 11/2017  .  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 Encouraged her to consider acupuncture as her   rheumatologist suggested as well  .  SKIN SAGGING DUE TO WEIGHT LOSS (ICD-757.9) (GUY40-H47.4) Causes issues wit  h skin chafing, irritation and rashes, esp with hot weather  .  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 in Rheum. [employer], still on prednisone, as above.  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  CERVICAL SPONDYLOSIS  Sparing use of baclofen, gabapentin for this. PT, acupuncture as above   .  [name]  I spent 15 minutes with the patient counseling and treating the above condi  tions.  Location of Provider:  Home  Location of Patient:  Home  Names of Participants for Visit: patient and physician  .  Orders (this visit):  Telephone Call Intermediate (11-20 min) [[id number]]  .  .  .  Patient Care Plan  .  [name]  Immunization Worksheet 2019 (rev 05/16/2018)   .  .  .  .  .  .  .  .  .  .  .  .  .  Follow-up  With:   .  [name]  Electronically Signed by [name], MD on 10/11/2018 at 9:06 AM  ________________________________________________________________________

## 2018-10-14 ENCOUNTER — Ambulatory Visit

## 2018-10-25 ENCOUNTER — Ambulatory Visit

## 2018-10-25 ENCOUNTER — Ambulatory Visit: Admitting: Rheumatology

## 2018-10-25 ENCOUNTER — Ambulatory Visit: Admitting: Infectious Disease

## 2018-10-25 ENCOUNTER — Ambulatory Visit: Admit: 2018-10-25

## 2018-10-25 NOTE — Progress Notes (Signed)
 * * *    [name] **DOB:** [birth year] (58 yo F) **Acc No.** [id number] **DOS:**  10/25/2018    ---       [name], Cedar**    ------    [name], DOB: 10/26/1960    [address] [place], [place] [address]    Home: [phone]    Provider: Judy [name]        * * *    Telephone Encounter    ---    Answered by  [name] Date: 10/25/2018       Time: 09:31 AM    Reason  *Rituxan Initial Clinical Assessment _ Pharmacy    ------            Message                     See virtual visit                Action Taken                     [name],[name] , [name] 10/25/2018 9:32:33 AM > Hi gina, can you start a PA for Rituxan 1g 2 weeks apart for RA? Thanks!      [name], [name] 10/30/2018 3:18:44 PM > Submitted      [name], [name] 10/31/2018 4:20:00 PM > [facility] called for more info regarding methotrexate therapy. Sent labs results for LFT's and office note from initial visits which mention methotrexate therapy not an option for documentation.      [name], [name] 11/01/2018 10:22:27 AM > APPROVED, SEE MED ACCESS NOTE                    * * *              * * *        ---      Reason for Appointment    ---     1\. *Humira Initial Clinical Assessment _ Pharmacy    ---     History of Present Illness    ---     _GENERAL_ :    Medications and drug allergies reviewed face to face with patient and updated  in eCW. Medication reconciliation completed. Patient was provided drug  specific counseling and education on the following: Rituxan.     Current Medications    ---    Taking    * Baclofen 10 MG Tablet 1/2 tab Orally prn, Notes: prn    ---    * Calcium Carbonate-Vitamin D 600-200 MG-UNIT Capsule 1 capsule with a meal Orally Twice a day    ---    * Famotidine 40 MG Tablet 1 tablet at bedtime Orally Once a day, Notes: prn    ---    * Gammagard - Solution as directed Injection once every 4 weeks, Notes: every 4 weeks    ---    * GlipiZIDE XL 5 MG Tablet Extended Release 24 Hour 1  tablet Orally Once a day    ---    * Glucophage XR 500 mg Tablet Extended Release 24 Hour 4 tablets Orally Once a day    ---    * Hydroxychloroquine Sulfate 200 mg Tablet 1 tablet with food or milk Orally Once a day    ---    * Jardiance 10 MG Tablet 1 tablet Orally Once a day    ---    * Lisinopril  2.5 MG Tablet 1 tablet Orally Once a day    ---    * Omeprazole 40 mg Capsule Delayed Release 1 capsule Orally Once a day    ---    * PredniSONE 10 MG Tablet TAKE 2 TABLETS BY MOUTH ONCE A DAY , Notes: currently on 20mg  once daily    ---    * Rosuvastatin Calcium 10 mg Tablet 1 tablet Orally Once a day    ---    * Tramadol HCl 50 MG Tablet 1 tablet Orally every 6 hrs, Notes: PRN - rarely takes    ---    * Vitamin B Complex - Tablet 1 tablet Orally once daily    ---    * Vitamin D 25 MCG (1000 UT) Tablet 1 tablet Orally Once a day    ---    * Voltaren 1 % Gel 2 grams Transdermal twice daily    ---    Discontinued    * Sulindac 150 mg Tablet 1 tablet with food Orally Twice a day    ---    * Medication List reviewed and reconciled with the patient    ---       **Medical History:**  Scleroderma - CREST dx 2007.    ---    IgG4 deficiency s/p IVIG 2010 ----single infusion given preventively after  week of bilateral knee replacements at [facility] 2010.    ---    Fx left wrist in 1994.    ---    Blood clot at LUE in 2006 on a short course of Coumadin.    ---    Bilateral carpal tunnel syndrome s/p Lt carpal tunnel release and steroids  injection right--.    ---    Bone spur left foot.    ---    Scoliosis.    ---    Spinal stenosis s/p steroids injection.    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 Katrinka Blazing 2010--- never infected, but packed with antibiotics with  surgery;.    ---    Right rotator cuff repairs x 4, complicated by repeat tears, infection,  placement of anchor material.    ---    Elevated liver function tests.    ---    Diabetes.    ---    Seronegative RA.    ---      Allergies    ---     N.K.D.A.    ---     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 Patient understands importance of medication adherence - Z78.9    ---     Preventive Medicine    ---     Medication education and counseling provided and included the following:  treatment start date (once PA approved, pt will get it done in [place]),  treatment stop date [indefinite], medication name(Rituxan), indications  (Rheumatoid arthritis), duration(every 6 months), administration instructions  (discussed infusion process including infusion center location, scheduling,  labs and vitals, premedications, rate of infusion, risk of infusion reaction),  goals of therapy) drug-drug interactions, drug-food interactions, importance  of medication adherence, medication dosing and frequency (1000mg  on day 1 and  day 15), missed dose management, storage and handling requirements, adverse  effects, warnings and precautions (discussed risk of infusion reaction,  headache, nausea, fatigue,rash, diarrhea, risk of infection, cytopenias, renal  toxicity), and lab monitoring parameters (discussed CBC,  Hep B, C,  quantiferon, renal, LFTs, inflammatory markers, signs of infection, cardiac  monitoring, signs of PML/GI obstruction/perforation) . Patient was provided  the following educational materials: Patient declined, will provide in IC if  needed. Paitent will get her COVID screening before the infusion. She has a  dental procedure on 8/9 and will schedule the infusion after the procedure.    Patient and son verbalized and demonstrated understanding of all instructions  provided and has no further questions at this time. Medication regimen  discussed with provider and care team. I spent 20 minutes with the patient, 20  minutes of which were spent counseling. Patient will continue to be followed  for ongoing medication education and adherence monitoring. My contact  information was provided  for any questions.        ---     Follow Up    ---    PRN          * * *         Provider: Judy [name] 10/25/2018    ---    Note generated by eClinicalWorks EMR/PM Software ([email or web address])

## 2018-10-25 NOTE — Progress Notes (Signed)
 * * *    [name], [name] **DOB:** [birth year] (58 yo F) **Acc No.** [id number] **DOS:**  10/25/2018    ---       [name], [name]**    ------    45 Y old Female, DOB: Jun 27, 1960, External MRN: [id number]    Account Number: [id number]    [address], [place], [id number]    Home: [phone]    Guarantor: [name] Insurance: [id number] PPO    PCP: [name], MD Referring: [name], MD    Appointment Facility: Rheumatology, Allergy and Immunology        * * *    10/25/2018 Progress Notes: [name], MD **CHN#:** [phone]    ------    ---       **Reason for Appointment**    ---      1\. FU    ---      **History of Present Illness**    ---     _GENERAL_ :    Overall she is feeling somewhat better except in her hands. Patient saw Dr.  [name] at the end of May and got an injection in the right wrist with  significant relief, so all the pain is in her 2nd and 3rd fingers on both  sides, typically swollen. Straightening hands is becoming more challenging.  She would like an injection in her right 2nd MCP, has gotten it before with  good relief.    Her right thigh and hip pain thought to be bursitis, received an injection at  the same visit, and that helped, but it is starting to come back again.  Affects her walking, now at a slower pace. Overall, doing better than when she  last saw [place].    HCQ, prednisone 20 mg daily, using diclofenac gel on hip, still getting IVIG  infusions every 4 weeks. Not yet on rituximab, would like to discuss further.    No recent infections or symptoms of infection. No chest pain or dyspnea. No  dysphagia. No rashes.      **Current Medications**    ---    Taking    * Baclofen 10 MG Tablet 1/2 tab Orally prn    ---    * Calcium Carbonate-Vitamin D 600-200 MG-UNIT Capsule 1 capsule with a meal Orally Twice a day    ---    * Famotidine 40 MG Tablet 1 tablet at bedtime Orally Once a day    ---    * Gammagard - Solution as directed Injection once every 4 weeks    ---    * GlipiZIDE XL 5  MG Tablet Extended Release 24 Hour 1 tablet Orally Once a day    ---    * Glucophage XR 500 mg Tablet Extended Release 24 Hour 4 tablets Orally Once a day    ---    * Hydroxychloroquine Sulfate 200 mg Tablet 1 tablet with food or milk Orally Once a day    ---    * Jardiance 10 MG Tablet 1 tablet Orally Once a day    ---    * Lisinopril 2.5 MG Tablet 1 tablet Orally Once a day    ---    * Omeprazole 40 mg Capsule Delayed Release 1 capsule Orally Once a day    ---    * PredniSONE 10 MG Tablet TAKE 2 TABLETS BY MOUTH ONCE A DAY     ---    * Rosuvastatin Calcium 10 mg Tablet 1 tablet Orally Once a  day    ---    * Sulindac 150 mg Tablet 1 tablet with food Orally Twice a day    ---    * Tramadol HCl 50 MG Tablet 1 tablet Orally every 6 hrs    ---    * Voltaren 1 % Gel 2 grams Transdermal twice daily    ---    * Medication List reviewed and reconciled with the patient    ---      **Past Medical History**    ---      Scleroderma - CREST dx 2007.        ---    IgG4 deficiency s/p IVIG 2010 ----single infusion given preventively after  week of bilateral knee replacements at [facility] 2010.        ---    Fx left wrist in 1994.        ---    Blood clot at LUE in 2006 on a short course of Coumadin.        ---    Bilateral carpal tunnel syndrome s/p Lt carpal tunnel release and steroids  injection right--.        ---    Bone spur left foot.        ---    Scoliosis.        ---    Spinal stenosis s/p steroids injection.        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 Katrinka Blazing 2010--- never infected, but packed with antibiotics with  surgery;.        ---    Right rotator cuff repairs x 4, complicated by repeat tears, infection,  placement of anchor material.        ---    Elevated liver function tests.        ---    Diabetes.        ---      **Surgical History**    ---      Right rotator cuff repair, with infected hardware that had to be removed  2006    ---    Repeat right shoulder  surgery, also which became infected. 2007    ---    Left rotator cuff repair 2008    ---    bilateral knee replacements 2009    ---    Left arthroscopic carpal tunnel release 01/2011    ---    ORIF Left 4th metatarsal bone    ---    Left Ulna shortening 1994    ---    Knee replacement 2010    ---    Hand surgery 2013, 2015    ---    remove gallbladder/hernia 1990    ---    Plate left foot 3rd metatarsal 2008    ---      **Family History**    ---      Mother: deceased 65 yrs, lung cancer, hyperthyroidism, diagnosed with Other  malignant neoplasm of unspecified site    ---    Father: deceased 34s yrs, heart attack, Unspecified heart disease    ---    3 brother(s) .    ---    FH of arthritis and scleroderma\nFather deceased from MI\nMother deceased age  57 with lung cancer \\nBrother  with scleroderma \/ copd.    ---      **Social History**    ---    Tobacco history: Never smoked.    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    Alcohol  Former daily EtOH use in 20s.  Nonsmoker.    Lives with longstanding boyfriend.    ---      **Allergies**    ---      N.K.D.A.    ---      **Hospitalization/Major Diagnostic Procedure**    ---      as above    ---     **Review of Systems**    ---     _Rheumatology_ :    General No fever, weight loss, swollen glands. Muscoskeletal +Joint pain,  +joint swelling, +morning stiffness . Eyes No vision loss, eye dry, red eye,  eye pain. Mouth No mouth sores. Cardiovascular ? raynaud, no chest pain ,  irregular heart beat, racing heart beat, leg swelling. Pulmonary No cough,  cough blood, shortness of breath. Skin No rash, photosensitivity. Lymphatics  No swollen glands, tender glands.         **Vital Signs**    ---    Pain scale 3, Ht-in 62, Wt-lbs 170, BMI 31.09, BP 124/79, HR 97.      **Examination**    ---     _RAPID 3:_    Function (0-10): 7.0.        Pain (0-10): 5.5.        Patient Global (0-10): 7.0.    _Rheumatology:_    General Appearance:  Alert and oriented , No  apparent distress .    Eyes:  No, scleral icterus, scleral erythema .    Pulm: Clear to auscultation bilaterally, no crackles/wheezes/rhonchi.    Abdomen Soft, nondistended.    Skin: No rashes or nail changes.    Muskuloskeletal Elbows, shoulders, hips, knees have intact ROM with no  synovitis. Hands have synovitis in the 2-3 PIPs and MCPs bilaterally. Positive  MCP squeeze b/l. Heberden's node to R2 DIP.    Skin and Nails  Minimal sclerodactyly.         **Assessments**    ---    1\. 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 Type 2 diabetes mellitus with hyperglycemia, without long-term current use  of insulin - E11.65    ---    3\. Long-term use of high-risk medication - Z79.899    ---    4\. Synovitis of hand - M65.9    ---    5\. 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 Risks  and benefits reviewed and I will also have her meet with the pharmacist. will  work on getting her started on Rituximab, which has evidence for the treatment  of RA in the context of COVID. She is still getting IVIG which hopefully  should provide some protection for other infections. She can come in to see me  or ortho for injections as needed. I will plan to see her back in 2 months for  further follow-up and evaluation. She will need covid testing prior to her  infusion.    ---      **Treatment**    ---      **1\. Seronegative rheumatoid arthritis**    Continue Gammagard Solution, -, as directed, Injection, once every 4 weeks,  Notes: every 4 weeks    Continue Hydroxychloroquine Sulfate Tablet, 200 mg, 1 tablet with food or  milk, Orally, Once a day    Continue PredniSONE Tablet, 10 MG, TAKE 2 TABLETS BY MOUTH  ONCE A DAY, Notes:  currently on 20mg  once daily    Start Rituxan Solution, 500 MG/50ML, as directed, Intravenous    _LAB: COVID-19 (SARS-CoV-2) PCR RAPID_    ---      **Follow Up**    ---    2 Months    Electronically signed by Erasmo Leventhal , MD on 11/03/2018 at 11:41 AM EDT    Sign off status: Completed        * * *        Rheumatology, Allergy and Immunology    [address]    [address], [address]    [place], [place] [address]    Tel: [phone]    Fax: [phone]              * * *          Progress Note: [name], MD 10/25/2018    ---    Note generated by eClinicalWorks EMR/PM Software ([email or web address])

## 2018-10-25 NOTE — Progress Notes (Signed)
 .  Progress Notes  .  Patient: [name], [name]  Provider: [name]  DOB: 03-18-1961 Age: 58 Y Sex: Female  .  PCP: [name]  MD  Date: 10/25/2018  .  --------------------------------------------------------------------------------  .  REASON FOR APPOINTMENT  .  1. FU  .  HISTORY OF PRESENT ILLNESS  .  GENERAL:   Overall she is feeling somewhat better except in her hands.  Patient saw Dr. [name] at the end of May and got an injection in  the right wrist with significant relief, so all the pain is in  her 2nd and 3rd fingers on both sides, typically swollen.  Straightening hands is becoming more challenging. She would like  an injection in her right 2nd MCP, has gotten it before with good  relief.Her right thigh and hip pain thought to be bursitis,  received an injection at the same visit, and that helped, but it  is starting to come back again. Affects her walking, now at a  slower pace. Overall, doing better than when she last saw [place].HCQ,  prednisone 20 mg daily, using diclofenac gel on hip, still  getting IVIG infusions every 4 weeks. Not yet on rituximab, would  like to discuss further. No recent infections or symptoms of  infection. No chest pain or dyspnea. No dysphagia. No rashes.  .  CURRENT MEDICATIONS  .  Taking Baclofen 10 MG Tablet 1/2 tab Orally prn  Taking Calcium Carbonate-Vitamin D 600-200 MG-UNIT Capsule 1  capsule with a meal Orally Twice a day  Taking Famotidine 40 MG Tablet 1 tablet at bedtime Orally Once a  day  Taking Gammagard - Solution as directed Injection once every 4  weeks  Taking GlipiZIDE XL 5 MG Tablet Extended Release 24 Hour 1 tablet  Orally Once a day  Taking Glucophage XR 500 mg Tablet Extended Release 24 Hour 4  tablets Orally Once a day  Taking Hydroxychloroquine Sulfate 200 mg Tablet 1 tablet with  food or milk Orally Once a day  Taking Jardiance 10 MG Tablet 1 tablet Orally Once a day  Taking Lisinopril 2.5 MG Tablet 1 tablet Orally Once a day  Taking Omeprazole  40 mg Capsule Delayed Release 1 capsule Orally  Once a day  Taking PredniSONE 10 MG Tablet TAKE 2 TABLETS BY MOUTH ONCE A DAY  Taking Rosuvastatin Calcium 10 mg Tablet 1 tablet Orally Once a  day  Taking Sulindac 150 mg Tablet 1 tablet with food Orally Twice a  day  Taking Tramadol HCl 50 MG Tablet 1 tablet Orally every 6 hrs  Taking Voltaren 1 % Gel 2 grams Transdermal twice daily  Medication List reviewed and reconciled with the patient  .  PAST MEDICAL HISTORY  .  Scleroderma - CREST dx 2007  IgG4 deficiency s/p IVIG 2010 ----single infusion given  preventively after week of bilateral knee replacements at [facility]  2010  Fx left wrist in 1994  Blood clot at LUE in 2006 on a short course of Coumadin  Bilateral carpal tunnel syndrome s/p Lt carpal tunnel release and  steroids injection right--  Bone spur left foot  Scoliosis  Spinal stenosis s/p steroids injection  s/p bilateral knee replacements for valgus /arthritic  complications, performed by Dr. Katrinka Blazing 2010--- never infected, but  packed with antibiotics with surgery;  Right rotator cuff repairs x 4, complicated by repeat tears,  infection, placement of anchor material  Elevated liver function tests  Diabetes  .  ALLERGIES  .  N.K.D.A.  .  SURGICAL HISTORY  .  Right rotator cuff repair, with infected hardware that had to be  removed 2006  Repeat right shoulder surgery, also which became infected. 2007  Left rotator cuff repair 2008  bilateral knee replacements 2009  Left arthroscopic carpal tunnel release 01/2011  ORIF Left 4th metatarsal bone  Left Ulna shortening 1994  Knee replacement 2010  Hand surgery 2013, 2015  remove gallbladder/hernia 1990  Plate left foot 3rd metatarsal 2008  .  FAMILY HISTORY  .  Mother: deceased 81 yrs, lung cancer, hyperthyroidism, diagnosed  with Other malignant neoplasm of unspecified site  Father: deceased 80s yrs, heart attack, Unspecified heart disease  3 brother(s) .  FH of arthritis and scleroderma\nFather deceased from  MI\nMother  deceased age 67 with lung cancer \\nBrother  with scleroderma \/  copd.  .  SOCIAL HISTORY  .  .  Tobacco  history: Never smoked.  .  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  .  Alcohol Former daily EtOH use in 20s.  .  Nonsmoker.Lives with longstanding boyfriend.  [name]  HOSPITALIZATION/MAJOR DIAGNOSTIC PROCEDURE  .  as above  .  REVIEW OF SYSTEMS  .  Rheumatology:  .  General    No fever, weight loss, swollen glands . Muscoskeletal     +Joint pain, +joint swelling, +morning stiffness  . Eyes    No  vision loss, eye dry, red eye, eye pain . Mouth    No mouth sores  . Cardiovascular    ? raynaud, no chest pain , irregular heart  beat, racing heart beat, leg swelling . Pulmonary    No cough,  cough blood, shortness of breath . Skin    No rash,  photosensitivity . Lymphatics    No swollen glands, tender glands  .  [name]  VITAL SIGNS  .  Pain scale 3, Ht-in 62, Wt-lbs 170, BMI 31.09, BP 124/79, HR 97.  [name]  EXAMINATION  .  RAPID 3: Function(0-10): 7.0. Pain(0-10):5.5.  Patient Global(0-10):7.0.  .  Rheumatology:  General Appearance: Alert and oriented , No apparent distress .  Eyes: No, scleral icterus, scleral erythema .  Pulm:Clear to auscultation bilaterally, no  crackles/wheezes/rhonchi.  AbdomenSoft, nondistended.  Skin:No rashes or nail changes.  MuskuloskeletalElbows, shoulders, hips, knees have intact ROM  with no synovitis. Hands have synovitis in the 2-3 PIPs and MCPs  bilaterally. Positive MCP squeeze b/l. Heberden's node to R2 DIP.  Skin and Nails Minimal sclerodactyly.  .  ASSESSMENTS  .  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  Type 2 diabetes mellitus with hyperglycemia, without long-term  current use of insulin - E11.65  .  Long-term use of high-risk medication - Z79.899  .  Synovitis of hand - M65.9  .  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  We again discussed the option of starting rituximab and she  agreed. Risks and benefits reviewed and I will also have her meet  with the pharmacist.  will work on getting her started on  Rituximab, which has evidence for the treatment of RA in the  context of COVID. She is still getting IVIG which hopefully  should provide some protection for other infections. She can come  in to see me or ortho for injections as needed. I will plan to  see her back in 2 months for further follow-up and evaluation.  She will need covid testing prior to her infusion.  .  TREATMENT  .  Seronegative rheumatoid arthritis  Continue Gammagard  Solution, -, as directed, Injection, once  every 4 weeks, Notes: every 4 weeks  Continue Hydroxychloroquine Sulfate Tablet, 200 mg, 1 tablet with  food or milk, Orally, Once a day  Continue PredniSONE Tablet, 10 MG, TAKE 2 TABLETS BY MOUTH ONCE A  DAY, Notes: currently on 20mg  once daily  Start Rituxan Solution, 500 MG/50ML, as directed, Intravenous  LAB: COVID-19 (SARS-CoV-2) PCR RAPID  .  FOLLOW UP  .  2 Months  .  Electronically signed by Erasmo Leventhal , MD on  11/03/2018 at 11:41 AM EDT  .  Document electronically signed by [name]

## 2018-10-25 NOTE — Telephone Encounter (Signed)
 Call Details:   Patient PCP = [name], MD  [name] (Patient) called on October 25, 2018 3:57 PM.  Message taken by: I Guadelupe Sabin  Primary call-back number: [phone]    Secondary call-back number: () -    Call Reason(s): Message/Call-Back      ** MESSAGE / CALL-BACK.  Regarding: Pt requests a pre-authorization for Oxycodone at   [facility] [phone].    ---------- ---------- ---------- ---------- ---------- ----------       RESPONSE/ORDERS:  Patient is not on chronic opioid registry. Last prescribed 10/11/18 by PCP for spinal stenosis and chronic back pain.  PA team could you help with this?  Thanks  ......................................Marland KitchenCleon Gustin Sahore PA  October 25, 2018 4:09 PM    RX PA approved for Oxycodone 5mg  28/28 per [facility].This approval is effective 10/28/18 to 10/28/19. case number [id number]. 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)  DIABETES MELLITUS, TYPE II (ICD-250.00) (ICD10-E11.9)  LONG-TERM (CURRENT) USE OF STEROIDS (ICD-V58.65) (ICD10-Z79.51)  SKIN SAGGING DUE TO WEIGHT LOSS (ICD-757.9) (ICD10-Q84.9)  TRANSAMINASES, SERUM, ELEVATED (ICD-790.4) (ICD10-R74.0)  OBESITY, BMI 30-34.9, ADULT (ICD-278.00) (ICD10-E66.9)      DIABETES MELLITUS, TYPE II, UNCONTROLLED (ICD-250.02) (ICD10-E11.65)      FATTY LIVER DISEASE (ICD-571.8) (ICD10-K76.0)  SCLERODERMA, 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)  HYPOTHYROIDISM (ICD-244.9) (ICD10-E03.9)  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  (ICD-279.03) (ICD10-D80.8)  SPINAL STENOSIS, 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)  HEPATITIS C EXPOSURE (HCV RNA NEGATIVE, 01/2014) (ICD-V02.62) (ICD10-Z20.5)  PAIN IN JOINT, HAND (ICD-719.44) (ICD10-M79.643)  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)  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)  CARPAL TUNNEL (ICD-354.0) (ICD10-G56.00)  OSTEOARTHRITIS (ICD-715.09) (ICD10-M15.9)  S/P ROTATOR CUFF SURGERY 12/2004 - RT SHOULDER; 2008 LT (ICD-V45.89)  Family Hx of MELANOMA, FAMILY HX (ICD-V16.8) (ICD10-Z80.8)  FRACTURE OF OTHER SPEC SITE,  PATHOLOGIC - MULTIPLE (ICD-733.19)  CHOLECYSTECTOMY AND HERNIA REPAIR (ICD-V45.89)  VITAMIN D DEFICIENCY (ICD-268.9) (ICD10-E55.9)  COLONOSCOPY, NEXT 2020- SEE COMMENT (ICD-V76.51) (ICD10-Z01.89)    Meds (prior to this call):   HYDROXYCHLOROQUINE SULFATE 200 MG ORAL TABLET (HYDROXYCHLOROQUINE SULFATE) one tablet oral daily; Route: ORAL  JARDIANCE 10 MG ORAL TABLET (EMPAGLIFLOZIN) Take 1 tablet by mouth once daily; Route: ORAL  ROSUVASTATIN 10MG  TABLETS (ROSUVASTATIN CALCIUM) TAKE 1 TABLET BY MOUTH EVERY DAY  LISINOPRIL 2.5MG  TABLETS (LISINOPRIL) TAKE 1 TABLET BY MOUTH ONCE DAILY  METFORMIN HCL ER 500 MG XR24H-TAB (METFORMIN HCL) TAKE 4 TABLETS BY MOUTH EVERY MORNING  GLIPIZIDE ER 2.5 MG XR24H-TAB (GLIPIZIDE) TAKE 1 TABLET BY MOUTH DAILY  PREDNISONE 10 MG ORAL TABLET (PREDNISONE) Take 1 tablet by mouth once daily; Route: ORAL  OMEPRAZOLE 20 MG ORAL CAPSULE DELAYED RELEASE (OMEPRAZOLE) Take one capsule by mouth twice a day; Route: ORAL  IBUPROFEN 600 MG ORAL TABLET (IBUPROFEN) 1 tab by mouth TID as needed for pain  BACLOFEN 10MG  TABLETS (BACLOFEN) TAKE 1/2 TABLET BY MOUTH EVERY DAY AS NEEDED FOR BACK OR SPASMS  DICLOFENAC SODIUM 1 % TRANSDERMAL GEL (DICLOFENAC SODIUM) APP 2 GRAMS EXT AA BID      FREESTYLE FREEDOM LITE w/Device KIT (  BLOOD GLUCOSE MONITORING SUPPL) use as directed (ICD 10 E11.65)      FREESTYLE LITE BLOOD GLUCOSE STRIPS (GLUCOSE BLOOD) USE TO CHECK BLOOD GLUCOSE  THREE TIMES DAILY      FREESTYLE LANCETS (LANCETS) check fingerstick three times a day (ICD 10 E11.65)  TRAMADOL HCL 50 MG ORAL TABLET (TRAMADOL HCL) take one tablet daily as needed for pain; Route: ORAL  OXYCODONE HCL 5 MG ORAL TABLET (OXYCODONE HCL) Take one tablet daily as needed for pain >8. Partial fill upon patient request.; Route: ORAL  CALCIUM CARBONATE-VITAMIN D 600-200 MG-UNIT ORAL TABLET (CALCIUM CARBONATE-VITAMIN D) take one tablet twice a day; Route: ORAL  GABAPENTIN 300MG  CAPSULES (GABAPENTIN) TAKE 1 CAPSULE BY MOUTH EVERY NIGHT AT BEDTIME AS NEEDED FOR PAIN  FAMOTIDINE 20 MG ORAL TABLET (FAMOTIDINE) ; Route: ORAL  * BLOODWORK Please draw chemistry (sodium, potassium, magnesium, chloride, bicarbonate, glucose, creatinine, BUN), CBC, TSH, lipids, HbA1c. Fax result to [phone] Dr. Marlane Hatcher            Created By I Guadelupe Sabin on 10/25/2018 at 03:57 PM    Electronically Signed By [name], MD on 10/28/2018 at 03:23 PM

## 2018-10-26 LAB — HX MICRO-RESP VIRAL PANEL: HX COVID-19 (SARS-COV-2): NEGATIVE

## 2018-11-01 ENCOUNTER — Ambulatory Visit

## 2018-11-01 NOTE — Progress Notes (Signed)
* * *      Capley, Haruye **DOB:** 1960-04-13 (58 yo F) **Acc No.** [id number] **DOS:**  11/01/2018    ---       [name], Franchelle**    ------    [name], DOB: 1960/06/29    [address], [place], [place] [address]    Home: [phone]    Provider: Peter Congo, [name]        * * *    Telephone Encounter    ---    Answered by  Lindley Magnus Date: 11/01/2018       Time: 10:15 AM    Reason  *Med_Acc_RITUXAN_Pharmacy    ------            Message                     PA approved RITUXAN per Modesta Messing, Ph [phone], plan type: COMM. PA effective from 11/02/2018 to 11/01/2019. PA #[id number]. [facility] CAN PROVIDE MEDICATION. Approval letter is scanned under patient docs in [facility].                                                                                                                                                         Action Taken                     Lindley Magnus, [name] 11/01/2018 10:20:32 AM >      [name],[name] , [name] 11/01/2018 1:00:50 PM > Thanks! New order written for IC, patient notified.                     * * *                ---          * * *         Provider: Peter Congo, [name] 11/01/2018    ---    Note generated by eClinicalWorks EMR/PM Software ([email or web address])

## 2018-11-08 ENCOUNTER — Ambulatory Visit

## 2018-11-08 LAB — UNMAPPED LAB RESULTS: BUN (EXT): 14 mg/dL (ref 7–20)

## 2018-11-08 NOTE — Progress Notes (Signed)
* * *      [name], [name] **DOB:** [birth year] (58 yo F) **Acc No.** [id number] **DOS:**  11/08/2018    ---       [name], [name]**    ------    5 Y old Female, DOB: [birth year]    [address], [place], [place] [address]    Home: [phone]    Provider: Ulice Dash        * * *    Telephone Encounter    ---    Answered by  Ulice Dash Date: 11/08/2018       Time: 09:13 PM    Message                     Hi, I received a page from the [facility] about [name]'s lab results that were drawn earlier today, that resulted with a glucose of 49 and no other significant abnormalities. I called and left a message on her machine just to check in with her to see how she's feeling and requested her to call back if there are any issues. She does take several oral diabetic agents so if this recurs might be worth discussing with endo. Thanks            -Called patient.  She feels good.  She reports that labs were drawn before she ate breakfast that day.  Since then her BS has run around 90-110.  She has no concerns at this time.          ------            Action Taken                     Martin Belling  11/08/2018 9:15:11 PM >      HARVEY,WILLIAM F 11/15/2018 4:03:29 PM > Please call her and see how she is doing...      Denton Meek , PA 11/15/2018 4:14:18 PM > Called pt and she is doing well.  Labs were drawn before breakfast.  BS WNL since then.                    * * *                ---          * * *         Provider: Audley Hose, Sydelle Sherfield 11/08/2018    ---    Note generated by eClinicalWorks EMR/PM Software ([email or web address])

## 2018-11-13 ENCOUNTER — Ambulatory Visit

## 2018-11-15 ENCOUNTER — Ambulatory Visit: Admitting: Rheumatology

## 2018-11-15 ENCOUNTER — Ambulatory Visit (HOSPITAL_BASED_OUTPATIENT_CLINIC_OR_DEPARTMENT_OTHER)

## 2018-11-15 ENCOUNTER — Ambulatory Visit

## 2018-11-15 ENCOUNTER — Ambulatory Visit: Admit: 2018-11-15 | Disposition: A | Discharge status: Home / Self Care | Payer: HMO

## 2018-11-15 LAB — HX HEM-ROUTINE
HX BASO #: 0 10*3/uL (ref 0.0–0.2)
HX BASO: 0 %
HX EOSIN #: 0.2 10*3/uL (ref 0.0–0.5)
HX EOSIN: 2 %
HX HCT: 44.3 % (ref 32.0–45.0)
HX HGB: 14.3 g/dL (ref 11.0–15.0)
HX IMMATURE GRANULOCYTE#: 0 10*3/uL (ref 0.0–0.1)
HX IMMATURE GRANULOCYTE: 0 %
HX LYMPH #: 1.1 10*3/uL (ref 1.0–4.0)
HX LYMPH: 15 %
HX MCH: 30.8 pg (ref 26.0–34.0)
HX MCHC: 32.3 g/dL (ref 32.0–36.0)
HX MCV: 95.5 fL (ref 80.0–98.0)
HX MONO #: 0.6 10*3/uL (ref 0.2–0.8)
HX MONO: 9 %
HX MPV: 10.4 fL (ref 9.1–11.7)
HX NEUT #: 5.5 10*3/uL (ref 1.5–7.5)
HX NRBC #: 0 10*3/uL
HX NUCLEATED RBC: 0 %
HX PLT: 162 10*3/uL (ref 150–400)
HX RBC BLOOD COUNT: 4.64 M/uL (ref 3.70–5.00)
HX RDW: 12.8 % (ref 11.5–14.5)
HX SEG NEUT: 73 %
HX WBC: 7.6 10*3/uL (ref 4.0–11.0)

## 2018-11-15 LAB — HX CHEM-LFT
HX ALANINE AMINOTRANSFERASE (ALT/SGPT): 122 IU/L — ABNORMAL HIGH (ref 0–54)
HX ALKALINE PHOSPHATASE (ALK): 333 IU/L — ABNORMAL HIGH (ref 40–130)
HX ASPARTATE AMINOTRANFERASE (AST/SGOT): 84 IU/L — ABNORMAL HIGH (ref 10–42)
HX BILIRUBIN, DIRECT: 0.6 mg/dL — ABNORMAL HIGH (ref 0.0–0.5)
HX BILIRUBIN, TOTAL: 0.7 mg/dL (ref 0.2–1.1)

## 2018-11-15 LAB — HX CHEM-OTHER
HX ALBUMIN: 3.8 g/dL (ref 3.4–4.8)
HX PROTEIN, TOTAL: 6.9 g/dL (ref 6.0–8.3)

## 2018-11-15 LAB — HX CHEM-PANELS
HX BLOOD UREA NITROGEN: 16 mg/dL (ref 6–24)
HX CREATININE (CR): 0.81 mg/dL (ref 0.57–1.30)
HX GFR, AFRICAN AMERICAN: 94 mL/min/{1.73_m2}
HX GFR, NON-AFRICAN AMERICAN: 81 mL/min/{1.73_m2}

## 2018-11-15 LAB — HX HEM-MISC: HX SED RATE: 21 mm (ref 0–30)

## 2018-11-15 MED ORDER — OXYCODONE HCL: 1 | 28 | 0 refills | 0 days | Status: AC

## 2018-11-15 MED ORDER — Hydroxychloroquine Sulfate: 200 | Tablet | Freq: Every day | 1 refills | 0 days | Status: AC

## 2018-11-15 MED ORDER — Omeprazole: 40 | Capsule | Freq: Every day | 1 refills | 0 days | Status: AC

## 2018-11-15 MED ORDER — PredniSONE: 10 | 120 | 0 refills | 0 days | Status: AC

## 2018-11-15 MED ORDER — Famotidine: 40 | Tablet | Freq: Every day | 1 refills | 0 days | Status: AC

## 2018-11-15 NOTE — Progress Notes (Signed)
* * *      Frese, Dessiree **DOB:** [birth year] (58 yo F) **Acc No.** [id number] **DOS:**  11/15/2018    ---       [name], Shivangi**    ------    60 Y old Female, DOB: Apr 08, 1960    [address] [place], [place] [address]    Home: [phone]    Provider: [name]        * * *    Telephone Encounter    ---    Answered by  Leotis Shames Date: 11/15/2018       Time: 11:19 AM    Caller  Pt    ------            Reason  switching pharmacies - need med transfer            Message                     Pt called for refill. Prednisone, omeprazole, famotidine, HCQ. 90 days                 Action Taken                     CHUI,TRAVIS  11/15/2018 11:27:44 AM > rx refilled                 Refills Continue Famotidine Tablet, 40 MG, Orally, 90 Tablet, 1 tablet at  bedtime, Once a day, 90 days, Refills=1    ------     Continue Hydroxychloroquine Sulfate Tablet, 200 MG, Orally, 90 Tablet, 1  tablet with food or milk, Once a day, 90 days, Refills=1     Continue PredniSONE Tablet, 10 MG, Orally, 120, TAKE 2 TABLETS BY MOUTH ONCE  A DAY, as directed, 60 days, Refills=0     Continue Omeprazole Capsule Delayed Release, 40 mg, Orally, 90 Capsule, 1  capsule, Once a day, 90 days, Refills=1          * * *              * * *        ---      Reason for Appointment    ---     1\. Switching pharmacies - need med transfer    ---     Assessments    ---    1\. 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 Seronegative rheumatoid arthritis**    Continue Famotidine Tablet, 40 MG, 1 tablet at bedtime, Orally, Once a day, 90  days, 90 Tablet, Refills 1, Notes: prn    Continue Hydroxychloroquine Sulfate Tablet, 200 MG, 1 tablet with food or  milk, Orally, Once a day, 90 days, 90 Tablet, Refills 1    Continue Omeprazole Capsule Delayed Release, 40 mg, 1 capsule, Orally, Once a  day, 90 days, 90 Capsule, Refills 1    ---         **2\. Others**    Continue PredniSONE Tablet, 10 MG, TAKE 2 TABLETS BY  MOUTH ONCE A DAY, Orally,  as directed, 60 days, 120, Refills 0, Notes: currently on 20mg  once daily          * * *         Provider: [name] 11/15/2018    ---    Note generated by eClinicalWorks EMR/PM Software ([email or web address])

## 2018-11-29 ENCOUNTER — Ambulatory Visit (HOSPITAL_BASED_OUTPATIENT_CLINIC_OR_DEPARTMENT_OTHER)

## 2018-11-29 ENCOUNTER — Ambulatory Visit: Admitting: Rheumatology

## 2018-11-29 ENCOUNTER — Ambulatory Visit

## 2018-11-29 ENCOUNTER — Ambulatory Visit: Admit: 2018-11-29 | Disposition: A | Discharge status: Home / Self Care

## 2018-11-29 LAB — HX HEM-ROUTINE
HX BASO #: 0 10*3/uL (ref 0.0–0.2)
HX BASO: 1 %
HX EOSIN #: 0.1 10*3/uL (ref 0.0–0.5)
HX EOSIN: 1 %
HX HCT: 43.7 % (ref 32.0–45.0)
HX HGB: 14.2 g/dL (ref 11.0–15.0)
HX IMMATURE GRANULOCYTE#: 0 10*3/uL (ref 0.0–0.1)
HX IMMATURE GRANULOCYTE: 0 %
HX LYMPH #: 1.9 10*3/uL (ref 1.0–4.0)
HX LYMPH: 19 %
HX MCH: 31.1 pg (ref 26.0–34.0)
HX MCHC: 32.5 g/dL (ref 32.0–36.0)
HX MCV: 95.8 fL (ref 80.0–98.0)
HX MONO #: 0.8 10*3/uL (ref 0.2–0.8)
HX MONO: 8 %
HX MPV: 9.8 fL (ref 9.1–11.7)
HX NEUT #: 7.1 10*3/uL (ref 1.5–7.5)
HX NRBC #: 0 10*3/uL
HX NUCLEATED RBC: 0 %
HX PLT: 216 10*3/uL (ref 150–400)
HX RBC BLOOD COUNT: 4.56 M/uL (ref 3.70–5.00)
HX RDW: 13.2 % (ref 11.5–14.5)
HX SEG NEUT: 71 %
HX WBC: 10 10*3/uL (ref 4.0–11.0)

## 2018-11-29 LAB — HX HEM-MISC: HX SED RATE: 12 mm (ref 0–30)

## 2018-11-29 LAB — HX CHEM-LFT
HX ALANINE AMINOTRANSFERASE (ALT/SGPT): 134 IU/L — ABNORMAL HIGH (ref 0–54)
HX ALKALINE PHOSPHATASE (ALK): 267 IU/L — ABNORMAL HIGH (ref 40–130)
HX ASPARTATE AMINOTRANFERASE (AST/SGOT): 79 IU/L — ABNORMAL HIGH (ref 10–42)
HX BILIRUBIN, DIRECT: 0.4 mg/dL (ref 0.0–0.5)
HX BILIRUBIN, TOTAL: 0.8 mg/dL (ref 0.2–1.1)

## 2018-11-29 LAB — HX CHEM-PANELS
HX BLOOD UREA NITROGEN: 23 mg/dL (ref 6–24)
HX CREATININE (CR): 0.8 mg/dL (ref 0.57–1.30)
HX GFR, AFRICAN AMERICAN: 96 mL/min/{1.73_m2}
HX GFR, NON-AFRICAN AMERICAN: 83 mL/min/{1.73_m2}

## 2018-11-29 LAB — HX CHEM-OTHER
HX ALBUMIN: 3.8 g/dL (ref 3.4–4.8)
HX PROTEIN, TOTAL: 7.4 g/dL (ref 6.0–8.3)

## 2018-11-29 LAB — HX IMMUNOLOGY: HX C-REACTIVE PROTEIN - CRP: 2.4 mg/L (ref 0.00–7.48)

## 2018-12-06 ENCOUNTER — Ambulatory Visit: Admitting: Adult Health

## 2018-12-06 NOTE — Progress Notes (Signed)
* * *      [name], [name] **DOB:** 03-19-1961 (58 yo F) **Acc No.** [id number] **DOS:**  12/06/2018    ---       [name], [name]**    ------    [name], DOB: 1961/01/26    [address] [place], [place] [address]    Home: [phone]    Provider: [name]        * * *    Telephone Encounter    ---    Answered by  Marland Mcalpine Date: 12/06/2018       Time: 12:40 PM    Caller  Patient    ------            Reason  Metformin recall?            Message                     pt states that she received a letter at her home informing her that the metformin she's taking has been recalled. She'd like to know how to proceed. The best contact information for her is [phone]. If new script is send, [facility] [address] [facility] [place].                Action Taken                     [name],Layza  12/06/2018 12:41:58 PM >      SIEGEL,RICHARD  12/06/2018 2:13:21 PM > Please call pharmacy to find  out  if her specific metformin ER has been recalled.  If so, has the pharmacy chosen another generic formulation  to replace this.  If the patient needs a new Rx sent in for short acting metformin, I will send  it in.      Soto,Nathalie  12/10/2018 9:36:13 AM > Only certain lots are recalled, this particular pharmacy did not have any of the recalled lots in stock. Patient is fine to continue receiving her medication, does not need different Rx, does not need new Rx. LVM informing patient of this      SIEGEL,RICHARD  12/10/2018 10:18:53 AM > Noted                    * * *                ---          * * *         Provider: [name] 12/06/2018    ---    Note generated by eClinicalWorks EMR/PM Software ([email or web address])

## 2018-12-07 LAB — UNMAPPED LAB RESULTS: BUN (EXT): 21 mg/dL — ABNORMAL HIGH (ref 7–20)

## 2019-01-03 ENCOUNTER — Ambulatory Visit

## 2019-01-06 ENCOUNTER — Ambulatory Visit

## 2019-01-06 ENCOUNTER — Ambulatory Visit: Admitting: Physician Assistant

## 2019-01-06 MED ORDER — PredniSONE: 10 | 120 | 0 refills | 0 days | Status: AC

## 2019-01-10 ENCOUNTER — Ambulatory Visit: Admitting: Rheumatology

## 2019-01-10 ENCOUNTER — Ambulatory Visit

## 2019-01-10 ENCOUNTER — Ambulatory Visit: Admit: 2019-01-10

## 2019-01-10 MED ORDER — PredniSONE: 10 | Tablet | Freq: Every day | 3 refills | 0 days | Status: AC

## 2019-01-10 MED ORDER — Lidocaine: 5 | 30 | Freq: Every day | 3 refills | 0 days | Status: AC

## 2019-01-10 MED ORDER — Bactrim: Tablet | Freq: Every day | 3 refills | 0 days | Status: AC

## 2019-01-10 NOTE — Progress Notes (Signed)
 * * *    [name], [name] **DOB:** [birth year] (58 yo F) **Acc No.** [id number] **DOS:**  01/10/2019    ---       [name], [name]**    ------    85 Y old Female, DOB: February 02, 1961, External MRN: [id number]    Account Number: [id number]    [address] [place], [id number]    Home: [phone]    Guarantor: [name] Insurance: [id number] PPO    PCP: Ellin Goodie, MD Referring: Ellin Goodie, MD    Appointment Facility: Rheumatology, Allergy and Immunology        * * *    01/10/2019 Progress Notes: [name], MD **CHN#:** [id number]    ------    ---       **History of Present Illness**    ---     _GENERAL_ :    She is doing very poorly. She has tremendous pain, stiffness and swelling in  the hands. Her Raynaud has started to act up, earlier than usual in the season  with recent cooling of the weather. she is struggling to do ADLs. She  tolerated rituximab, completing her first course of treatment about 6 weeks  ago. She has not noticed any difference yet in her symptoms. She would like to  take an increased dose of steroids, as 20mg  is not helping. She also informs  me that [place] She plans to relocate to  the [facility] area. She has not had any recent infections and she has  been socially distant and wearing masks.      **Current Medications**    ---    Taking    * Baclofen 10 MG Tablet 1/2 tab Orally prn, Notes: prn    ---    * Calcium Carbonate-Vitamin D 600-200 MG-UNIT Capsule 1 capsule with a meal Orally Twice a day    ---    * Famotidine 40 MG Tablet 1 tablet at bedtime Orally Once a day, Notes: prn    ---    * Gammagard - Solution as directed Injection once every 4 weeks, Notes: every 4 weeks    ---    * GlipiZIDE XL 5 MG Tablet Extended Release 24 Hour 1 tablet Orally Once a day    ---    * Glucophage XR 500 mg Tablet Extended Release 24 Hour 4 tablets Orally Once a day    ---    * Hydroxychloroquine Sulfate 200 MG Tablet 1 tablet with food or milk Orally Once  a day    ---    * Jardiance 10 MG Tablet 1 tablet Orally Once a day    ---    * Lisinopril 2.5 MG Tablet 1 tablet Orally Once a day    ---    * Omeprazole 40 mg Capsule Delayed Release 1 capsule Orally Once a day    ---    * PredniSONE 10 MG Tablet TAKE 2 TABLETS BY MOUTH ONCE A DAY Orally as directed    ---    * Rituxan 500 MG/50ML Solution as directed Intravenous , Notes: Last dose 11/29/18    ---    * Rosuvastatin Calcium 10 mg Tablet 1 tablet Orally Once a day    ---    * Tramadol HCl 50 MG Tablet 1 tablet Orally every 6 hrs, Notes: PRN - rarely takes    ---    * Vitamin B Complex - Tablet 1 tablet Orally once daily    ---    *  Vitamin D 25 MCG (1000 UT) Tablet 1 tablet Orally Once a day    ---    * Voltaren 1 % Gel 2 grams Transdermal twice daily    ---    * Medication List reviewed and reconciled with the patient    ---      **Past Medical History**    ---      Scleroderma - CREST dx 2007.        ---    IgG4 deficiency s/p IVIG 2010 ----single infusion given preventively after  week of bilateral knee replacements at [facility] 2010.        ---    Fx left wrist in 1994.        ---    Blood clot at LUE in 2006 on a short course of Coumadin.        ---    Bilateral carpal tunnel syndrome s/p Lt carpal tunnel release and steroids  injection right--.        ---    Bone spur left foot.        ---    Scoliosis.        ---    Spinal stenosis s/p steroids injection.        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 Katrinka Blazing 2010--- never infected, but packed with antibiotics with  surgery;.        ---    Right rotator cuff repairs x 4, complicated by repeat tears, infection,  placement of anchor material.        ---    Elevated liver function tests.        ---    Diabetes.        ---    Seronegative RA.        ---      **Allergies**    ---      N.K.D.A.    ---      **Review of Systems**    ---     _Rheumatology_ :    General No fever, weight loss, swollen glands.  Muscoskeletal +Joint pain,  +joint swelling, +morning stiffness . Eyes No vision loss, eye dry, red eye,  eye pain. Mouth No mouth sores. Cardiovascular ? raynaud, no chest pain ,  irregular heart beat, racing heart beat, leg swelling. Pulmonary No cough,  cough blood, shortness of breath. Skin No rash, photosensitivity. Lymphatics  No swollen glands, tender glands.         **Vital Signs**    ---    Ht-in 62, BP 112/72, HR 86, Temp /    97%.      **Examination**    ---     _Rheumatology:_    General Appearance:  Alert and oriented , No apparent distress .    Eyes:  No, scleral icterus, scleral erythema .    Pulm: Clear to auscultation bilaterally, no crackles/wheezes/rhonchi.    Abdomen Soft, nondistended.    Skin: No rashes or nail changes.    Muskuloskeletal Elbows, shoulders, hips, knees have intact ROM with no  synovitis. Hands have synovitis in the 2-3 PIPs and MCPs bilaterally. Positive  MCP squeeze b/l. Heberden's node to R2 DIP.    Skin and Nails  Minimal sclerodactyly.         **Assessments**    ---    1\. 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 Long-term use of high-risk medication - Z79.899    ---     She continues to do poorly, however I am still hopeful she may get some  benefit from rituxan, as it has only been 5-6 weeks since her dose. In the  meantime, I think she needs a higher dose of steroids, so I will increase to  30 mg Because of her CVID and high prednisone dose, I recommend she take  Bactrim for PCP prophylaxis as well. She has some back pain and we can try a  lidocaine patch, which has helped in the past. I will plan to see her back in  1 month before she leaves for [place]. She is also due for PFTs and echo to  evaluate for the presence of cardiopulmonary complications of scleroderma. She  will also continue monthly IVIG infusions to treat CVID. SHe is at high  risk  for infection with the treatments plus CVID, especially during the pandemic. I  encouraged her to maintain safe practices during the pandemic.    ---      **Treatment**    ---      **1\. Seronegative rheumatoid arthritis**    Start Lidocaine Patch, 5 %, 1 patch remove after 12 hours, Externally, Once a  day, 30 days, 30, Refills 3    Start PredniSONE Tablet, 10 MG, 3 tablet, Orally, Once a day, 30 day(s), 90  Tablet, Refills 3    Start Bactrim Tablet, 400-80 MG, 1 tablet, Orally, Once a day, 30 days, 30  Tablet, Refills 3    Notes: PFTs    Echocardiogram    .    ---     **Follow Up**    ---    4 Weeks    Electronically signed by Erasmo Leventhal , MD on 01/26/2019 at 10:41 AM EDT    Sign off status: Completed        * * *        Rheumatology, Allergy and Immunology    [address]    [facility] [address], [address]    [place], [place] [address]    Tel: [phone]    Fax: [phone]              * * *          Progress Note: [name], MD 01/10/2019    ---    Note generated by eClinicalWorks EMR/PM Software ([email or web address])

## 2019-01-10 NOTE — Progress Notes (Signed)
 .  Progress Notes  .  Patient: [name], [name]  Provider: [name]  DOB: [birth year] Age: 58 Y Sex: Female  .  PCP: Ellin Goodie  MD  Date: 01/10/2019  .  --------------------------------------------------------------------------------  .  HISTORY OF PRESENT ILLNESS  .  GENERAL:  She is doing very poorly. She has  tremendous pain, stiffness and swelling in the hands. Her Raynaud  has started to act up, earlier than usual in the season with  recent cooling of the weather. she is struggling to do ADLs. She  tolerated rituximab, completing her first course of treatment  about 6 weeks ago. She has not noticed any difference yet in her  symptoms. She would like to take an increased dose of steroids,  as 20mg  is not helping. She also informs me that she plans to  [place]. She plans to relocate to the  [facility] area. She has not had any recent infections and  she has been socially distant and wearing masks.  .  CURRENT MEDICATIONS  .  Taking Baclofen 10 MG Tablet 1/2 tab Orally prn, Notes: prn  Taking Calcium Carbonate-Vitamin D 600-200 MG-UNIT Capsule 1  capsule with a meal Orally Twice a day  Taking Famotidine 40 MG Tablet 1 tablet at bedtime Orally Once a  day, Notes: prn  Taking Gammagard - Solution as directed Injection once every 4  weeks, Notes: every 4 weeks  Taking GlipiZIDE XL 5 MG Tablet Extended Release 24 Hour 1 tablet  Orally Once a day  Taking Glucophage XR 500 mg Tablet Extended Release 24 Hour 4  tablets Orally Once a day  Taking Hydroxychloroquine Sulfate 200 MG Tablet 1 tablet with  food or milk Orally Once a day  Taking Jardiance 10 MG Tablet 1 tablet Orally Once a day  Taking Lisinopril 2.5 MG Tablet 1 tablet Orally Once a day  Taking Omeprazole 40 mg Capsule Delayed Release 1 capsule Orally  Once a day  Taking PredniSONE 10 MG Tablet TAKE 2 TABLETS BY MOUTH ONCE A DAY  Orally as directed  Taking Rituxan 500 MG/50ML Solution as directed Intravenous ,  Notes:  Last dose 11/29/18  Taking Rosuvastatin Calcium 10 mg Tablet 1 tablet Orally Once a  day  Taking Tramadol HCl 50 MG Tablet 1 tablet Orally every 6 hrs,  Notes: PRN - rarely takes  Taking Vitamin B Complex - Tablet 1 tablet Orally once daily  Taking Vitamin D 25 MCG (1000 UT) Tablet 1 tablet Orally Once a  day  Taking Voltaren 1 % Gel 2 grams Transdermal twice daily  Medication List reviewed and reconciled with the patient  .  PAST MEDICAL HISTORY  .  Scleroderma - CREST dx 2007  IgG4 deficiency s/p IVIG 2010 ----single infusion given  preventively after week of bilateral knee replacements at [facility]  2010  Fx left wrist in 1994  Blood clot at LUE in 2006 on a short course of Coumadin  Bilateral carpal tunnel syndrome s/p Lt carpal tunnel release and  steroids injection right--  Bone spur left foot  Scoliosis  Spinal stenosis s/p steroids injection  s/p bilateral knee replacements for valgus /arthritic  complications, performed by Dr. Katrinka Blazing 2010--- never infected, but  packed with antibiotics with surgery;  Right rotator cuff repairs x 4, complicated by repeat tears,  infection, placement of anchor material  Elevated liver function tests  Diabetes  Seronegative RA  .  ALLERGIES  .  N.K.D.A.  .  REVIEW OF SYSTEMS  .  Rheumatology:  .  General    No fever, weight loss, swollen glands . Muscoskeletal     +Joint pain, +joint swelling, +morning stiffness  . Eyes    No  vision loss, eye dry, red eye, eye pain . Mouth    No mouth sores  . Cardiovascular    ? raynaud, no chest pain , irregular heart  beat, racing heart beat, leg swelling . Pulmonary    No cough,  cough blood, shortness of breath . Skin    No rash,  photosensitivity . Lymphatics    No swollen glands, tender glands  .  [name]  VITAL SIGNS  .  Ht-in 62, BP 112/72, HR 86, Temp /97%.  [name]  EXAMINATION  .  Rheumatology:  General Appearance: Alert and oriented , No apparent distress .  Eyes: No, scleral icterus, scleral erythema .  Pulm:Clear to auscultation bilaterally,  no  crackles/wheezes/rhonchi.  AbdomenSoft, nondistended.  Skin:No rashes or nail changes.  MuskuloskeletalElbows, shoulders, hips, knees have intact ROM  with no synovitis. Hands have synovitis in the 2-3 PIPs and MCPs  bilaterally. Positive MCP squeeze b/l. Heberden's node to R2 DIP.  Skin and Nails Minimal sclerodactyly.  .  ASSESSMENTS  .  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  Scleroderma - M34.9  .  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  Long-term use of high-risk medication - Z79.899  .  She continues to do poorly, however I am still hopeful she may  get some benefit from rituxan, as it has only been 5-6 weeks  since her dose. In the meantime, I think she needs a higher dose  of steroids, so I will increase to 30 mg Because of her CVID and  high prednisone dose, I recommend she take Bactrim for PCP  prophylaxis as well. She has some back pain and we can try a  lidocaine patch, which has helped in the past. I will plan to see  her back in 1 month before she leaves for [place]. She is also  due for PFTs and echo to evaluate for the presence of  cardiopulmonary complications of scleroderma. She will also  continue monthly IVIG infusions to treat CVID. SHe is at high  risk for infection with the treatments plus CVID, especially  during the pandemic. I encouraged her to maintain safe practices  during the pandemic.  [name]  TREATMENT  .  Seronegative rheumatoid arthritis  Start Lidocaine Patch, 5 %, 1 patch remove after 12 hours,  Externally, Once a day, 30 days, 30, Refills 3  Start PredniSONE Tablet, 10 MG, 3 tablet, Orally, Once a day, 30  day(s), 90 Tablet, Refills 3  Start Bactrim Tablet, 400-80 MG, 1 tablet, Orally, Once a day, 30  days, 30 Tablet, Refills 3  Notes: PFTs  Echocardiogram  .  .  FOLLOW UP  .  4 Weeks  .  Electronically signed by Erasmo Leventhal , MD on  01/26/2019 at 10:41 AM EDT  .  Document electronically signed by [name]

## 2019-01-17 ENCOUNTER — Ambulatory Visit

## 2019-01-17 ENCOUNTER — Ambulatory Visit: Admit: 2019-01-17

## 2019-01-17 LAB — HX BF-CHEM/URINE
HX ALBUMIN RANDOM URINE: <0.5 mg/dL
HX CREATININE, RANDOM URINE: 34.65 mg/dL

## 2019-01-17 LAB — HX POINT OF CARE: HX HGB A1C, POC: 6.5 % — ABNORMAL HIGH

## 2019-01-17 LAB — HX DIABETES: HX ALBUMIN RANDOM URINE: <0.5 mg/dL

## 2019-01-17 MED ORDER — FAMOTIDINE: 1 | 30 | 6 refills | 0 days | Status: AC

## 2019-01-17 NOTE — Progress Notes (Signed)
 [facility] January 17, 2019  [address]   [place]  [place] [address]  Main: [phone]  Fax: [phone]  Patient Portal: [email or web address]                    This form should be completed fully then forwarded to the  GI Coordinator desktop in [place]    Appointment date & time:  Next early morning appointment      Name: [name] Gender Female DOB: Jun 20, 1960 [place] [facility] Medical Record #: [id number]  Interpreter Needed:     Yes / No  Language: English  Dialect:                    Address: [address]  [place] [address]  Email:[name]  Patient Height: 62.5 inches   Weight: 169.6 lbs  List all phone numbers and indicate the preferred number:  [ X ]Best # [phone]  [  ]Home#: [phone]   [  ]Mobile #: [phone]  Best Time to Call:                     Insurance Carrier: [employer] PPO Policy ID#: [id number]  Order Created by : Gunnar Fusi, MD   Phone: [phone]        PROCEDURE REQUEST    01/17/2019 Colonoscopy [[id number]] for ICD10-Z01.89  COLONOSCOPY, NEXT 2020- SEE COMMENT signed by [name] -JN 6A-  Colonoscopy Patient Instructions (check applicable):  Desired Physician: ___________  ___ Medication/Condition Instructions  ___ Require Pre-Procedure Clearance from Ctr for Preop Assessment  ___ Special Notes: (document below)      Any Pre-Op or Special Conditions:          Medications for [name] #[id number] on 01/17/2019:  HYDROXYCHLOROQUINE SULFATE 200 MG ORAL TABLET (HYDROXYCHLOROQUINE SULFATE) one tablet oral daily; Route: ORAL  JARDIANCE 10 MG ORAL TABLET (EMPAGLIFLOZIN) Take 1 tablet by mouth once daily; Route: ORAL  ROSUVASTATIN 10MG  TABLETS (ROSUVASTATIN CALCIUM) TAKE 1 TABLET BY MOUTH EVERY DAY  LISINOPRIL 2.5MG  TABLETS (LISINOPRIL) TAKE 1 TABLET BY MOUTH ONCE DAILY  METFORMIN HCL ER 500 MG XR24H-TAB (METFORMIN HCL) TAKE 4 TABLETS BY MOUTH EVERY MORNING  GLIPIZIDE ER 2.5 MG XR24H-TAB (GLIPIZIDE) TAKE  1 TABLET BY MOUTH DAILY  PREDNISONE 10 MG ORAL TABLET (PREDNISONE) Take 1 tablet by mouth once daily; Route: ORAL  OMEPRAZOLE 20 MG ORAL CAPSULE DELAYED RELEASE (OMEPRAZOLE) Take one capsule by mouth twice a day; Route: ORAL  IBUPROFEN 600 MG ORAL TABLET (IBUPROFEN) 1 tab by mouth TID as needed for pain  BACLOFEN 10MG  TABLETS (BACLOFEN) TAKE 1/2 TABLET BY MOUTH EVERY DAY AS NEEDED FOR BACK OR SPASMS  DICLOFENAC SODIUM 1 % TRANSDERMAL GEL (DICLOFENAC SODIUM) APP 2 GRAMS EXT AA BID      FREESTYLE FREEDOM LITE w/Device KIT (BLOOD GLUCOSE MONITORING SUPPL) use as directed (ICD 10 E11.65)      FREESTYLE LITE BLOOD GLUCOSE STRIPS (GLUCOSE BLOOD) USE TO CHECK BLOOD GLUCOSE THREE TIMES DAILY      FREESTYLE LANCETS (LANCETS) check fingerstick three times a day (ICD 10 E11.65)  TRAMADOL HCL 50 MG ORAL TABLET (TRAMADOL HCL) take one tablet daily as needed for pain; Route: ORAL  OXYCODONE HCL 5 MG ORAL TABLET (OXYCODONE HCL) Take one tablet daily as needed for pain >8. Partial fill upon patient request.; Route: ORAL  CALCIUM CARBONATE-VITAMIN D 600-200 MG-UNIT ORAL TABLET (CALCIUM CARBONATE-VITAMIN D) take one tablet twice a day; Route: ORAL  GABAPENTIN 300MG  CAPSULES (GABAPENTIN) TAKE 1 CAPSULE BY MOUTH EVERY NIGHT AT BEDTIME AS NEEDED FOR PAIN  FAMOTIDINE 20 MG ORAL TABLET (FAMOTIDINE) ; Route: ORAL  * BLOODWORK Please draw chemistry (sodium, potassium, magnesium, chloride, bicarbonate, glucose, creatinine, BUN), CBC, TSH, lipids, HbA1c. Fax result to [phone] Dr. 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) (ICD10-M47.812)  NECK PAIN (ICD-723.1) (ICD10-M54.2)  STEROID USE, LONG TERM (ICD-V58.65) (ICD10-Z79.52)  PREMATURE VENTRICULAR  CONTRACTIONS (ICD-427.69) (ICD10-I49.3)  HYPERLIPIDEMIA (ICD-272.4) (ICD10-E78.5)  BACK PAIN, LUMBAR, CHRONIC (ICD-724.2) (ICD10-M54.5)  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)  DIABETES MELLITUS, TYPE II (ICD-250.00) (ICD10-E11.9)  LONG-TERM (CURRENT) USE OF STEROIDS (ICD-V58.65) (ICD10-Z79.51)  SKIN SAGGING DUE TO WEIGHT LOSS (ICD-757.9) (ICD10-Q84.9)  TRANSAMINASES, SERUM, ELEVATED (ICD-790.4) (ICD10-R74.0)  OBESITY, BMI 30-34.9, ADULT (ICD-278.00) (ICD10-E66.9)      DIABETES MELLITUS, TYPE II, UNCONTROLLED (ICD-250.02) (ICD10-E11.65)      FATTY LIVER DISEASE (ICD-571.8) (ICD10-K76.0)  SCLERODERMA, 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)  HYPOTHYROIDISM (ICD-244.9) (ICD10-E03.9)  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) (ICD10-D80.8)  SPINAL STENOSIS, 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)  HEPATITIS C EXPOSURE (HCV RNA NEGATIVE, 01/2014) (ICD-V02.62) (ICD10-Z20.5)  PAIN IN JOINT, HAND (ICD-719.44) (ICD10-M79.643)  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)  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

## 2019-01-17 NOTE — Progress Notes (Signed)
 [facility] January 17, 2019  [address]   [place]  [place] [address]  Main: [phone]  Fax: [phone]  Patient Portal: [email or web address]                     01/17/2019    RE: [name]  [address]  [place], [place]  [address]          MR#: [id number]          DOB: September 19, 1960    To Whom It May Concern:    Ms. [name] is a patient under my care at [facility]. She was seen on 01/17/19 at which time she receieved her flu vaccine. For any questions please call my office.             Sincerely,      Ellin Goodie, MD  [facility]        Created By Frederich Cha on 01/17/2019 at 05:15 PM    Electronically Signed By [name] -JN 6A- on 01/18/2019 at 05:51 PM

## 2019-01-17 NOTE — Progress Notes (Signed)
 General Medicine Visit - Resident note with preceptor addendum  .  DIAB EYE EX, MICROALB/CRE, LDL, HGBA1C or HGBA1C%POC, PAP SMEAR.    .  * Sedan: [name])  .  Patient Details and Vitals  Patient reviewed in/by:  Office  Patient Best Phone # [phone]  Patient Email Address [name]  .  BMI: 28.00  .  BP: 121/ 83  Ht (inches): 62.5  Weight: 155  BMI: 28.00  Temp: 98.6  Pulse: 97  .  Med List: PRINTED by [facility] for patient   hd_medl: printed  .  [name]  Patient Medical History   Travel outside of the [place] in past 28 days:: No  .  In the past year, have you ... Had no falls  Difficulty with balance? NO  Use a Cane NO  Use a Walker NO  Need assistance with ambulation while here? NO  .  [name]  Tobacco use? never smoker  Does patient experience chronic pain ? YES  Severity of pain? (min=0, max=10) 7  .  Patient Screening   .  .  .  .  PHQ2 (Q1) Little Interest or pleasure in doing things: 0  PHQ2 (Q2) Feeling down, depressed or hopeless: 0  PHQ2 Score: 0  .  Abuse/Neglect (Q1) Feel unsafe in relationship: NO  Abuse/Neglect (Q2) Hurt in past year: NO  .  .  .  .  .  Data to be shared to Telemed/Office Visits   .....................................Marland KitchenMarland KitchenAdline Peals  January 17, 2019 3:35 PM  .  .  **Resident Note**  .  .  Patient Prep Updates   .  .  .  [name]  * Preceptor: Suzikida Noreene Larsson)  CC: follow up/est care   .  HPI: Ms. Fossett is a 58 year old femal with history of scleroderma, IgG4 def  iciency, diabetes, HTN and HLD who presents for annual exam.  .  Sceroderma  -Recently saw rheumatology and increased prednisone to 30mg  daily up from 2  0mg  daily.   -start bactrim for PCP ppx  -started rituxan  .  Diabetes  -FB 90-120  -BG in afternoon around 200  -just started increased prednisone 2 days ago, no changes in BG yet  eye appointment january, podiatristic in december  .  Separ[place] from life partner earlier this [place].   .  .  Past Medical History:  HEALTH CARE  MAINTENANCE  STD- low risk, deferred  Pap smear - Negative co-testing in 2015. Performed 01/17/2019.  Mammogram - normal 03/2017. normal 02/01/2018. repeat 2020 sheduled  Colonoscopy -  done 2016, repeat 2017 and both times poor prep but overall   has been sufficient for screening purposes.   In combination, these two col  onoscopies have likely given an adequate colon cancer screening. Recommend   repeat colonoscopy in 3 years.  1 polyp removed, next colonoscopy in 3 yrs   (2020). ***At that time in 2020, will do 7 days of daily miralax. Seven day  s low fiber diet. Two days clear liquids and a split prep with 2 dulcolax b  efore each dose of golytely.  DEXA (>71yr) - 2017 normal bone mass; on chronic prednisone use. 02/01/2018   nml bone mass, next due 2022  Lung cancer screening (55-80)- N/A  Influenza - annually  HPV - N/A  Hep C (0981-1914) - non reactive 2017  Shingles shot -  recommended she get Shingrix when/where it's available  Pneumonia - PPSV23 01/26/2017. Due for booster at age 69.  Tetanus and TDAP 11/04/2014  Contraception - post menopausal  LDL - 110 (01/04/2018)  HGBA1C - 7.7  (01/04/2018)  .  Family History: (reviewed)   Father: DMII, died of MI at 86  Mom: died of lung CA at 60  MGM and PGM: lung ca (smokers)  MGF and several other second degree relatives: brain aneurysms in their 35'  s.  2 maternal uncles:melanoma (one died of unknown cancer, one living)  brother recently diagnosed with systemic scleroderma c/b gangrene of fingers  .  Social History: (reviewed)   Non-smoker, rare etoh, no IVDU. 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 Separated from life partner of 20 years jan 20  20 and now planning to move south. No children. Always wears a seat belt.  .  .  ROS:  General: Denies fevers, chills.   Eyes: Denies vision loss.   Cardiovascular: Denies chest pains, palpitations.   Respiratory: Denies cough, dyspnea.   Gastrointestinal: Denies diarrhea, constipation.    Skin: Denies rash.   .  .  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)  DIABETES MELLITUS, TYPE II (ICD-250.00) (ICD10-E11.9)  LONG-TERM (CURRENT) USE OF STEROIDS (ICD-V58.65) (ICD10-Z79.51)  SKIN SAGGING DUE TO WEIGHT LOSS (ICD-757.9) (ICD10-Q84.9)  TRANSAMINASES, SERUM, ELEVATED (ICD-790.4) (ICD10-R74.0)  OBESITY, BMI 30-34.9, ADULT (ICD-278.00) (ICD10-E66.9)      DIABETES MELLITUS, TYPE II, UNCONTROLLED (ICD-250.02) (ICD10-E11.65)      FATTY LIVER DISEASE (ICD-571.8) (ICD10-K76.0)  SCLERODERMA, 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)  HYPOTHYROIDISM (ICD-244.9) (ICD10-E03.9)  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) (ICD10-D  80.8)  SPINAL STENOSIS, 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)  HEPATITIS C EXPOSURE (HCV RNA NEGATIVE, 01/2014) (ICD-V02.62) (ICD10-Z20.5)  PAIN IN JOINT, HAND (ICD-719.44) (ICD10-M79.643)  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)  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)  CARPAL TUNNEL (ICD-354.0) (ICD10-G56.00)  OSTEOARTHRITIS (ICD-715.09) (ICD10-M15.9)  S/P ROTATOR CUFF SURGERY 12/2004 - RT SHOULDER; 2008 LT (ICD-V45.89)  Family Hx of MELANOMA, FAMILY HX (ICD-V16.8) (ICD10-Z80.8)  FRACTURE OF OTHER SPEC SITE,  PATHOLOGIC - MULTIPLE (ICD-733.19)  CHOLECYSTECTOMY AND HERNIA REPAIR (ICD-V45.89)  VITAMIN D DEFICIENCY (ICD-268.9) (ICD10-E55.9)  COLONOSCOPY, NEXT 2020- SEE COMMENT (ICD-V76.51) (ICD10-Z01.89)  PROBLEM CHANGES:  Added new problem of SCREENING FOR CERVICAL CANCER (ICD-V76.2) (GMW10-U72.5  ) - Signed  Removed problem of DIARRHEA  (ICD-787.91) (ICD10-R19.7)  Removed problem of NAUSEA (ICD-787.02) (ICD10-R11.0)  Problems Reviewed:  Done  .  [name]  MEDICATIONS:   PAST MEDICINES (prior to today's visit):  HYDROXYCHLOROQUINE SULFATE 200 MG ORAL TABLET (HYDROXYCHLOROQUINE SULFATE)   one tablet oral daily; Route: ORAL  JARDIANCE 10 MG ORAL TABLET (EMPAGLIFLOZIN) Take 1 tablet by mouth once dai  ly; Route: ORAL  ROSUVASTATIN 10MG  TABLETS (ROSUVASTATIN CALCIUM) TAKE 1 TABLET BY MOUTH EVE  RY DAY  LISINOPRIL 2.5MG  TABLETS (LISINOPRIL) TAKE 1 TABLET BY MOUTH ONCE DAILY  METFORMIN HCL ER 500 MG XR24H-TAB (METFORMIN HCL) TAKE 4 TABLETS BY MOUTH E  VERY MORNING  GLIPIZIDE ER 2.5 MG XR24H-TAB (GLIPIZIDE) TAKE 1 TABLET BY MOUTH DAILY  PREDNISONE 10 MG ORAL TABLET (PREDNISONE) Take 1 tablet by  mouth once daily  ; Route: ORAL  OMEPRAZOLE 20 MG ORAL CAPSULE DELAYED RELEASE (OMEPRAZOLE) Take one capsule   by mouth twice a day; Route: ORAL  IBUPROFEN 600 MG ORAL TABLET (IBUPROFEN) 1 tab by mouth TID as needed for p  ain  BACLOFEN 10MG  TABLETS (BACLOFEN) TAKE 1/2 TABLET BY MOUTH EVERY DAY AS NEED  ED FOR BACK OR SPASMS  DICLOFENAC SODIUM 1 % TRANSDERMAL GEL (DICLOFENAC SODIUM) APP 2 GRAMS EXT A  A BID      FREESTYLE FREEDOM LITE w/Device KIT (BLOOD GLUCOSE MONITORING SUPPL) [place]  e as directed (ICD 10 E11.65)      FREESTYLE LITE BLOOD GLUCOSE STRIPS (GLUCOSE BLOOD) USE TO CHECK BLOOD   GLUCOSE THREE TIMES DAILY      FREESTYLE LANCETS (LANCETS) check fingerstick three times a day (ICD 10   E11.65)  TRAMADOL HCL 50 MG ORAL TABLET (TRAMADOL HCL) take one tablet daily as need  ed for pain; Route: ORAL  OXYCODONE HCL 5 MG ORAL TABLET (OXYCODONE HCL) Take one tablet daily as nee  ded for pain >8. Partial fill upon patient request.; Route: ORAL  CALCIUM CARBONATE-VITAMIN D 600-200 MG-UNIT ORAL TABLET (CALCIUM CARBONATE-  VITAMIN D) take one tablet twice a day; Route: ORAL  GABAPENTIN 300MG  CAPSULES (GABAPENTIN) TAKE 1 CAPSULE BY MOUTH EVERY NIGHT   AT BEDTIME AS NEEDED FOR  PAIN  FAMOTIDINE 20 MG ORAL TABLET (FAMOTIDINE) ; Route: ORAL  * BLOODWORK Please draw chemistry (sodium, potassium, magnesium, chloride,   bicarbonate, glucose, creatinine, BUN), CBC, TSH, lipids, HbA1c. Fax result   to [phone] Dr. Marlane Hatcher  .  MEDICINE CHANGES:  Removed medication of PREDNISONE 10 MG ORAL TABLET (PREDNISONE) Take 1 tabl  et by mouth once daily; Route: ORAL - Signed  Added new medication of PREDNISONE 10 MG ORAL TABLET (PREDNISONE) Take 3 ta  blets once a day; Route: ORAL - Signed  Removed medication of FAMOTIDINE 20 MG ORAL TABLET (FAMOTIDINE); Route: ORA  L - Signed  Added new medication of FAMOTIDINE 20 MG ORAL TABLET (FAMOTIDINE) Take 1 ta  blet  at night as needed.; Route: ORAL - Signed  Added new medication of BACTRIM 400-80 MG ORAL TABLET (SULFAMETHOXAZOLE-TRI  METHOPRIM); Route: ORAL - Signed  Rx of FAMOTIDINE 20 MG ORAL TABLET (FAMOTIDINE) Take 1 tablet  at night as   needed.; Route: ORAL  #30[Tablet] x 6;  Signed;  Entered by: Ivory Broad  am MD -JN 6A-;  Authorized by: [name] -JN 6A-;  Method used: E  lectronically to [id number] [address], [place],   [address], Ph: [phone], Fax: [phone]; Note to Pharmacy: Route: ORAL;  Medications Reviewed:  Done  .  [name]  ALLERGIES:   No Known Allergies  Allergies Reviewed:  Done  .  [name]  DIRECTIVES:   .  .  .  Vitals:   BP: 121 / 83 mmHg Ht: 62.5 in.  Wt: 155 lbs.  BMI (in-lb) 28.00  Temp: 98.6deg F.   Pulse Rate: 97 bpm  .  .  Additional PE: Gen: well appearing female, no acute distress  CV: RRR, no murmurs  Pulm: CTAB, normal work of breathing  Abd: soft, NT, ND  Ext: feet well kept with intact sensation, tender swollen metacarpal joints  GU: normal appearing external genitalia, cervix pink and normal appearing  .  [name]  Assessment /T/ Plan:   58 year old femal with history of scleroderma, IgG4 deficiency, diabetes, H  TN and HLD who presents  for follow up   .  1. Type 2 diabetes  Continue care w/ Endocrine-  - medications:  metformin, glipizide, jardiance  - check FS  - BP control is excellent on 2.5mg  lisinopril  - diabetic eye appt - in 04/2019  - neuropathy/foot exam appt 03/2019  - encourage diet/exercise  - lipid panel ordered today  - HGBA1C -  Went down to 6.5% today  - urine microalbumin   - watch BG while increasing prednisone dose  .  2. Weight loss  15 lb weight loss, she is eating well but more than expected given no subst  antial interventions. Has history of hypothyroidism and was on levothyroxin  e but has been off of it. Last PSH 2019 normal.  -repeat TSH  .  3.Scerlomerma   On chronic steroids. Just increased dose at last visit and started bactrim   ppx. DEXA 04/12/2018 with normal bone mass  -next DEXA 2022  -continue care with [name], continue Ca+Vit D and omeprazole.   .  4. EXCESS SKIN from weight loss w/ associated rashes  Lost 15 lbs in 10 months. Eating well.  -encourages healthy eating and exercise  .  5.HYPERLIPIDEMIA   -Continue rosuvastatin  -Lipid panel today  .  6. SPINAL STENOSIS, LUMBAR, BACK PAIN, LUMBAR, CHRONIC, CERVICAL SPONDYLOSI  S Continue current conservative measures, pain control -  rare oxycodone, g  abapentin, tramadol, baclofen  .  7. 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   .  8. Health Maintenance  -flu shot today  -PAP today   -Mammogram scheduled for 02/20/19  -Ordered colonoscopy: will need prep with 7 days of daily miralax. Seven da  ys low fiber diet. Two days clear liquids and a split prep with 2 dulcolax   before each dose of golytely  .  .  [name]  * Resident Signature (.sign): ......................................Marland KitchenEllin Goodie MD -JN 6A-  January 18, 2019 5:50 PM  .  .  .  ORDERS:  Potassium (K) [CPT-84132]  Sodium (NA) [CPT-84295]  Chloride (CL) [CHLORIDE] [CPT-82435]  CO2 [CPT-82374]  Glucose  [CPT-82947]  Creatinine (CR) [CREATININE] [CPT-82565]  Blood Urea Nitrogen [BUN] [CPT-84520]  LYTES [SODIUM] [CPT-82495]  Lipid Profile-Chol, HDL, Trig, LDL(calc)  [CHOLESTEROL] [CPT-80061]  TSH Reflex [TSH] [CPT-84439]  Colonoscopy [CPT-45378]  Microalbumin urine [MICROALB/CRE] [CPT-82043]  RTC in 3 months [RTC-090]  Pap Smear [PAP SMEAR] [CPT-88150]  Est Level 4 [CPT-99214]  Cervical Cancer-Screening (8185) [CPT-Q0091]  .  [name]  **Preceptor Note**  Resident: Ellin Goodie)  Problem Follow-up  .  [name]  Subjective   Patient is a 58 Years Old Female who presents to clinic hx scleroderma, ra,   dm, htn, here to est care  feeling well - recently prednisone increased for ra, started new infusion   sugars have been well controlled with this  .  15lb weight loss since Jan - somewhat intentional but hasn't been trying th  at hard  .  recently split from life partner - [place]   I discussed the patient with Dr. Tomasa Rand Chanetta Marshall) in a routine precepting   encounter.  I also personally interviewed and examined the patient.  I agr  ee with the patient's diagnosis and management.   .  Discussed Services Due: Yes  .  Objective   see resident's note for details.    .  Plan   T2DM: well controlled. urine microalb today  wt loss: start w/u with  labwork  hcm: pap updated, check lipids  mammogram is scheduled  colonoscopy ordered  flu shot given  .  fu in 4 months  See resident note for details, Follow-up per resident note  .  Seen On Team (Floor):  6A  .  .  .  Patient Care Plan  .  .  .  Immunization Worksheet 2019 (rev 05/16/2018)   .  Influenza VIS VIS handout, Influenza 11-15-2017  .  .  .  .  .  .  .  .  .  .  .  .  Follow-up With:   .  ]  .  [name]  Electronically Signed by Ailene Ards, MD on 01/19/2019 at 9:20 PM  ________________________________________________________________________

## 2019-01-20 ENCOUNTER — Ambulatory Visit

## 2019-01-20 NOTE — Progress Notes (Signed)
 [facility]  [address]  [place] [address]  Main: [phone]  Fax: [phone]  Patient Portal: [email or web address]      January 24, 2019            MRN: [id number]            DOB: [birth year]   Tishara [name] Picchi   [address]   [place] [address]      The results of your recent tests are as follows:      Diabetes Tests:      HgbA1c:    6.5  Normal 4.3 - 5.6    01/17/2019    HgbA1c POC:    6.5 %  Normal 4.3 - 5.6    01/17/2019    Microalb/Cre    Not Calculated mg/g  Normal 0 - 30    01/17/2019     Kidney Tests:      Microalb/Cre    Not Calculated mg/g  Normal 0 - 30    01/17/2019     Other Tests:  The pap smear was normal.      It was nice to meet you. Please let us know if you have any questions,       Sincerely,       Ailene Ards, MD for [name]  [facility]  [phone]  Results Provided by: Mail           Created By [facility] on 01/20/2019 at 11:15 AM    Electronically Signed By Ailene Ards MD on 01/24/2019 at 03:19 PM

## 2019-01-21 ENCOUNTER — Ambulatory Visit: Admitting: Hand Surgery

## 2019-01-21 LAB — HX CYTO GYN & NON GYN

## 2019-01-21 NOTE — Progress Notes (Signed)
* * *      Damore, Addisson **DOB:** [birth year] (58 yo F) **Acc No.** [id number] **DOS:**  01/21/2019    ---       [name], Annais**    ------    46 Y old Female, DOB: 1960/05/13    [address], [place], [place] [address]    Home: [phone]    Provider: Yetta Numbers        * * *    Telephone Encounter    ---    Answered by  Driscilla Moats Date: 01/21/2019       Time: 12:38 PM    Caller  patient    ------            Reason  11/9 appointment            Message                     good afternoon,            patient just wanted to make sure the reason of her follow up shceduled  on 11/9 is for her left shoulder. She could be reached at [phone] for any questions.            thank you                Action Taken                     Almendras,Marilyn  01/21/2019 12:39:49 PM >      Persico,Claudio  01/22/2019 9:23:53 AM > , Action - Pt telephoned.  Spoke to pt.                    * * *                ---          * * *         Provider: Yetta Numbers 01/21/2019    ---    Note generated by eClinicalWorks EMR/PM Software ([email or web address])

## 2019-01-29 ENCOUNTER — Ambulatory Visit: Admitting: Gastroenterology

## 2019-01-29 ENCOUNTER — Ambulatory Visit

## 2019-01-29 MED ORDER — GaviLyte-C: 240 | 1 | 0 refills | 0 days | Status: AC

## 2019-01-29 NOTE — Progress Notes (Signed)
* * *      [name], [name] **DOB:** [birth year] (58 yo F) **Acc No.** [id number] **DOS:**  01/29/2019    ---       [name], [name]**    ------    69 Y old Female, DOB: 1960-09-12    [address], [place], [place] [address]    Home: [phone]    Provider: Ria Bush        * * *    Telephone Encounter    ---    Answered by  Sharion Balloon Date: 01/29/2019       Time: 04:18 PM    Reason  prep-rx    ------            Refills Start GaviLyte-C Solution Reconstituted, 240 GM, Orally, 1, (If  unavailable, may dispense Gavilyte-Nper MD) 4000 ml, Please follow [place]  instructions, 1 day, Refills=0    ------          * * *                ---          * * *         Provider: Ria Bush 01/29/2019    ---    Note generated by eClinicalWorks EMR/PM Software ([email or web address])

## 2019-02-03 ENCOUNTER — Ambulatory Visit

## 2019-02-11 ENCOUNTER — Ambulatory Visit

## 2019-02-11 NOTE — Telephone Encounter (Signed)
 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)  DIABETES MELLITUS, TYPE II (ICD-250.00) (ICD10-E11.9)  LONG-TERM (CURRENT) USE OF STEROIDS (ICD-V58.65) (ICD10-Z79.51)  SKIN SAGGING DUE TO WEIGHT LOSS (ICD-757.9) (ICD10-Q84.9)  TRANSAMINASES, SERUM, ELEVATED (ICD-790.4) (ICD10-R74.0)  OBESITY, BMI 30-34.9, ADULT (ICD-278.00) (ICD10-E66.9)      DIABETES MELLITUS, TYPE II, UNCONTROLLED (ICD-250.02) (ICD10-E11.65)      FATTY LIVER DISEASE (ICD-571.8) (ICD10-K76.0)  SCLERODERMA, 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)  HYPOTHYROIDISM (ICD-244.9) (ICD10-E03.9)  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) (ICD10-D80.8)  SPINAL STENOSIS, 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)  HEPATITIS C EXPOSURE (HCV RNA NEGATIVE, 01/2014) (ICD-V02.62) (ICD10-Z20.5)  PAIN IN JOINT, HAND (ICD-719.44) (ICD10-M79.643)  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)  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)  CARPAL TUNNEL (ICD-354.0) (ICD10-G56.00)  OSTEOARTHRITIS (ICD-715.09) (ICD10-M15.9)  S/P ROTATOR CUFF SURGERY 12/2004 - RT SHOULDER; 2008 LT (ICD-V45.89)  Family Hx of MELANOMA, FAMILY HX (ICD-V16.8) (ICD10-Z80.8)  FRACTURE OF OTHER SPEC SITE,  PATHOLOGIC - MULTIPLE (ICD-733.19)  CHOLECYSTECTOMY AND HERNIA REPAIR (ICD-V45.89)  VITAMIN D DEFICIENCY (ICD-268.9) (ICD10-E55.9)  COLONOSCOPY, NEXT 2020- SEE COMMENT (ICD-V76.51) (ICD10-Z01.89)    Meds (prior to this call):   HYDROXYCHLOROQUINE SULFATE 200 MG ORAL TABLET (HYDROXYCHLOROQUINE SULFATE) one tablet oral daily; Route: ORAL  JARDIANCE 10 MG ORAL TABLET  (EMPAGLIFLOZIN) Take 1 tablet by mouth once daily; Route: ORAL  ROSUVASTATIN 10MG  TABLETS (ROSUVASTATIN CALCIUM) TAKE 1 TABLET BY MOUTH EVERY DAY  LISINOPRIL 2.5MG  TABLETS (LISINOPRIL) TAKE 1 TABLET BY MOUTH ONCE DAILY  METFORMIN HCL ER 500 MG XR24H-TAB (METFORMIN HCL) TAKE 4 TABLETS BY MOUTH EVERY MORNING  GLIPIZIDE ER 2.5 MG XR24H-TAB (GLIPIZIDE) TAKE 1 TABLET BY MOUTH DAILY  OMEPRAZOLE 20 MG ORAL CAPSULE DELAYED RELEASE (OMEPRAZOLE) Take one capsule by mouth twice a day; Route: ORAL  IBUPROFEN 600 MG ORAL TABLET (IBUPROFEN) 1 tab by mouth TID as needed for pain  BACLOFEN 10MG  TABLETS (BACLOFEN) TAKE 1/2 TABLET BY MOUTH EVERY DAY AS NEEDED FOR BACK OR SPASMS  DICLOFENAC SODIUM 1 % TRANSDERMAL GEL (DICLOFENAC SODIUM) APP 2 GRAMS EXT AA BID      FREESTYLE FREEDOM LITE w/Device KIT (BLOOD GLUCOSE MONITORING SUPPL) use as directed (ICD 10 E11.65)      FREESTYLE LITE BLOOD GLUCOSE STRIPS (GLUCOSE BLOOD) USE TO CHECK BLOOD GLUCOSE THREE TIMES DAILY      FREESTYLE LANCETS (LANCETS) check fingerstick three times a day (ICD 10 E11.65)  TRAMADOL HCL 50 MG ORAL TABLET (TRAMADOL HCL) take one tablet daily as needed for pain; Route: ORAL  OXYCODONE HCL 5 MG ORAL TABLET (OXYCODONE HCL) Take one tablet daily as needed for pain >8. Partial fill upon patient request.; Route: ORAL  CALCIUM CARBONATE-VITAMIN D 600-200 MG-UNIT ORAL TABLET (CALCIUM CARBONATE-VITAMIN D) take one tablet twice a day; Route: ORAL  GABAPENTIN 300MG  CAPSULES (GABAPENTIN) TAKE 1 CAPSULE BY MOUTH EVERY NIGHT AT BEDTIME AS NEEDED FOR PAIN  * BLOODWORK Please draw chemistry (sodium, potassium, magnesium, chloride, bicarbonate, glucose, creatinine, BUN), CBC, TSH, lipids, HbA1c. Fax result to [phone] Dr. Marlane Hatcher  PREDNISONE 10 MG ORAL TABLET (PREDNISONE) Take 3 tablets once a day; Route: ORAL  FAMOTIDINE 20 MG ORAL TABLET (FAMOTIDINE) Take 1 tablet  at  night as needed.; Route: ORAL  BACTRIM 400-80 MG ORAL TABLET (SULFAMETHOXAZOLE-TRIMETHOPRIM) ; Route:  ORAL            Created By Unknown Foley on 02/11/2019 at 01:16 PM    Electronically Signed By Unknown Foley on 02/11/2019 at 01:16 PM

## 2019-02-12 ENCOUNTER — Ambulatory Visit: Admitting: Gastroenterology

## 2019-02-12 NOTE — Progress Notes (Signed)
* * *      ZIPPORAH, FINAMORE [name] **DOB:** [birth year] (58 yo F) **Acc No.** [id number] **DOS:**  02/12/2019    ---       [name], Asheton [name]**    ------    39 Y old Female, DOB: 1960-12-14    [address] [place], [place] [address]    Home: [phone]    Provider: Ria Bush        * * *    Telephone Encounter    ---    Answered by  Sharion Balloon Date: 02/12/2019       Time: 09:50 AM    Reason  prep substitute    ------            Message                     [facility] called stating gavilyte c is out of stock. gavilyte g given instead.                 Action Taken                     Loisel,Olivia  02/12/2019 10:13:14 AM >                     * * *                ---          * * *         Provider: Ria Bush 02/12/2019    ---    Note generated by eClinicalWorks EMR/PM Software ([email or web address])

## 2019-02-17 ENCOUNTER — Ambulatory Visit

## 2019-02-17 ENCOUNTER — Ambulatory Visit: Admitting: Internal Medicine

## 2019-02-17 ENCOUNTER — Ambulatory Visit: Admit: 2019-02-17

## 2019-02-17 NOTE — Telephone Encounter (Signed)
 General Medicine Tele-Medicine Visit  This visit is done remotely with myself and this patient.  Patient presents during the COVID-19 pandemic / federally declared state of public health emergency.  Visit Type: Audio Visit Only  * Preceptor: Naggar Ree Kida)  Patient consent for audio or video type visit:  Yes  Patient does not have access to video visit:  Yes  Chief Complaint:   FMLA paperwork    History of Present Illness:   [name] is a 58 year old femal with history of scleroderma, cervical spondylosis, IgG4 deficiency, diabetes, HTN and HLD  with scheduled telehealth visit to fill out FMLA paperwork. Patient has monthly infusions for scleroderma as well as frequent appointment with orthopedics and PT for management of her chronic medical problems. During visit reviewed and completed FMLA paperwork. Patient had no other concerns.                 Family History: (reviewed)   Father: DMII, died of MI at 62  Mom: died of lung CA at 23  MGM and PGM: lung ca (smokers)  MGF and several other second degree relatives: brain aneurysms in their 71's.  2 maternal uncles:melanoma (one died of unknown cancer, one living)  brother recently diagnosed with systemic scleroderma c/b gangrene of fingers    Social History: (reviewed)   Non-smoker, rare etoh, no IVDU. 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 Separated from life partner of 20 years jan 2020 and now planning to move south. No children. Always wears a seat belt.        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)  DIABETES MELLITUS, TYPE II  (ICD-250.00) (ICD10-E11.9)  LONG-TERM (CURRENT) USE OF STEROIDS (ICD-V58.65) (ICD10-Z79.51)  SKIN SAGGING DUE TO WEIGHT LOSS (ICD-757.9) (ICD10-Q84.9)  TRANSAMINASES, SERUM, ELEVATED (ICD-790.4) (ICD10-R74.0)  OBESITY, BMI 30-34.9, ADULT (ICD-278.00) (ICD10-E66.9)      DIABETES MELLITUS, TYPE II, UNCONTROLLED (ICD-250.02) (ICD10-E11.65)      FATTY LIVER DISEASE (ICD-571.8) (ICD10-K76.0)  SCLERODERMA, 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)  HYPOTHYROIDISM (ICD-244.9) (ICD10-E03.9)  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) (ICD10-D80.8)  SPINAL STENOSIS, 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)  HEPATITIS C EXPOSURE (HCV RNA NEGATIVE, 01/2014) (ICD-V02.62) (ICD10-Z20.5)  PAIN IN JOINT, HAND (ICD-719.44) (ICD10-M79.643)  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)  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)  CARPAL TUNNEL (ICD-354.0) (ICD10-G56.00)  OSTEOARTHRITIS (ICD-715.09) (ICD10-M15.9)  S/P ROTATOR CUFF SURGERY 12/2004 - RT SHOULDER; 2008 LT (ICD-V45.89)  Family Hx of MELANOMA, FAMILY HX (ICD-V16.8) (ICD10-Z80.8)  FRACTURE OF OTHER SPEC SITE,  PATHOLOGIC - MULTIPLE (ICD-733.19)  CHOLECYSTECTOMY AND HERNIA REPAIR (ICD-V45.89)  VITAMIN D DEFICIENCY (ICD-268.9) (ICD10-E55.9)  COLONOSCOPY, NEXT 2020- SEE COMMENT (ICD-V76.51) (ICD10-Z01.89)    Past Medical History (changes today):  Added new problem of SCLERODERMA (ICD-710.1) (ICD10-M34.9)         Medications (prior to today's visit):  HYDROXYCHLOROQUINE SULFATE 200 MG ORAL TABLET (HYDROXYCHLOROQUINE SULFATE) one tablet oral daily; Route: ORAL  JARDIANCE 10 MG ORAL TABLET (EMPAGLIFLOZIN) Take 1 tablet by mouth once daily; Route: ORAL  ROSUVASTATIN 10MG  TABLETS (ROSUVASTATIN CALCIUM) TAKE 1 TABLET BY MOUTH EVERY DAY  LISINOPRIL 2.5MG  TABLETS (LISINOPRIL) TAKE 1 TABLET BY MOUTH ONCE DAILY  METFORMIN HCL ER 500 MG  XR24H-TAB (METFORMIN HCL) TAKE 4 TABLETS BY MOUTH EVERY MORNING  GLIPIZIDE ER 2.5 MG XR24H-TAB (GLIPIZIDE) TAKE 1 TABLET BY MOUTH DAILY  OMEPRAZOLE 20  MG ORAL CAPSULE DELAYED RELEASE (OMEPRAZOLE) Take one capsule by mouth twice a day; Route: ORAL  IBUPROFEN 600 MG ORAL TABLET (IBUPROFEN) 1 tab by mouth TID as needed for pain  BACLOFEN 10MG  TABLETS (BACLOFEN) TAKE 1/2 TABLET BY MOUTH EVERY DAY AS NEEDED FOR BACK OR SPASMS  DICLOFENAC SODIUM 1 % TRANSDERMAL GEL (DICLOFENAC SODIUM) APP 2 GRAMS EXT AA BID      FREESTYLE FREEDOM LITE w/Device KIT (BLOOD GLUCOSE MONITORING SUPPL) use as directed (ICD 10 E11.65)      FREESTYLE LITE BLOOD GLUCOSE STRIPS (GLUCOSE BLOOD) USE TO CHECK BLOOD GLUCOSE THREE TIMES DAILY      FREESTYLE LANCETS (LANCETS) check fingerstick three times a day (ICD 10 E11.65)  TRAMADOL HCL 50 MG ORAL TABLET (TRAMADOL HCL) take one tablet daily as needed for pain; Route: ORAL  OXYCODONE HCL 5 MG ORAL TABLET (OXYCODONE HCL) Take one tablet daily as needed for pain >8. Partial fill upon patient request.; Route: ORAL  CALCIUM CARBONATE-VITAMIN D 600-200 MG-UNIT ORAL TABLET (CALCIUM CARBONATE-VITAMIN D) take one tablet twice a day; Route: ORAL  GABAPENTIN 300MG  CAPSULES (GABAPENTIN) TAKE 1 CAPSULE BY MOUTH EVERY NIGHT AT BEDTIME AS NEEDED FOR PAIN  * BLOODWORK Please draw chemistry (sodium, potassium, magnesium, chloride, bicarbonate, glucose, creatinine, BUN), CBC, TSH, lipids, HbA1c. Fax result to [phone] Dr. Marlane Hatcher  PREDNISONE 10 MG ORAL TABLET (PREDNISONE) Take 3 tablets once a day; Route: ORAL  FAMOTIDINE 20 MG ORAL TABLET (FAMOTIDINE) Take 1 tablet  at night as needed.; Route: ORAL  BACTRIM 400-80 MG ORAL TABLET (SULFAMETHOXAZOLE-TRIMETHOPRIM) ; Route: ORAL    No Changes to Medication List                  Vitals from Patient:   Ht: 62.5 in.            Additional PE:   Tele health visit  Assessment & Plan:   1. FMLA paperwork / scleorderma   -Completed FMLA paperwork as patient requires monthly infusions for scleroderma as well as frequent appointment with orthopedics and PT for management of her chronic medical problems       I spent 20 minutes  with the patient counseling and treating the above conditions.  Location of Provider:  Primary Care Office  Location of Patient:  Home    Orders (this visit):  Telephone Call Intermediate (11-20 min) [[id number]]  Telephone Call Intermediate (11-20 min) [[id number]]    **Preceptor Note**         Subjective   Patient is a 58 Years Old Female who presents to clinic with problems as outlined in resident's note.               Patient Care Plan      Immunization Worksheet 2019 (rev 05/16/2018)                             Follow-up With:           Created By [name] -JN 6A- on 02/17/2019 at 02:48 PM    Electronically Signed By [name] Frederick MD on 02/20/2019 at 12:44 PM

## 2019-02-17 NOTE — Progress Notes (Signed)
 [facility] February 17, 2019  [address]   [place]  [place] [address]  Main: [phone]  Fax: [phone]  Patient Portal: [email or web address]            Fax Cover Sheet      To: [facility]    Time:     Company:     Fax Number: [phone]      From: [facility] [address]    Phone number: [phone]    Fax Number: [phone]      # of pages following: ________      Comments:        ----------------------  Confidentiality Notice  ----------------------    The documents accompanying this facsimile contain confidential information that may be legally protected by federal and state law. This information is intended for use only by the entity or individual to whom it is addressed. The authorized recipient is obligated to maintain this information in a safe, secure and confidential manner, and is prohibited from unauthorized use, disclosure, or failure to maintain confidentiality of this information, unless required to do so by law or regulation.    If you are neither the intended recipient nor the employee or agent of the intended recipient responsible for the delivery of this information, you are hereby notified that any improper disclosure, copying, use or distribution of this information is strictly prohibited. Please notify the sender immediately to arrange for the return of the transmitted documents or to verify destruction.            Created By [employer] on 02/17/2019 at 02:58 PM    Electronically Signed By [employer] on 02/17/2019 at 02:58 PM

## 2019-02-17 NOTE — Progress Notes (Signed)
[facility] February 17, 2019  [address]   [place]  [place] [address]  Main: [phone]  Fax: [phone]  Patient Portal: [name]  [address]  [place], [place]  [address]  MR#: [id number]      Dear  Ms. [name],            Created By [facility] on 02/17/2019 at 03:13 PM    Electronically Signed By [employer] on 02/17/2019 at 03:14 PM

## 2019-02-20 ENCOUNTER — Ambulatory Visit: Admitting: Internal Medicine

## 2019-02-20 ENCOUNTER — Ambulatory Visit: Admit: 2019-02-20 | Payer: HMO

## 2019-02-20 NOTE — Progress Notes (Signed)
 General Medicine Tele-Medicine Visit  This visit is done remotely with myself andthis patient.  Patient presents during the COVID-19 pandemic / federally declared state of   public health emergency.  Visit Type: Audio Visit Only  * Preceptor: Naggar Ree Kida)  Patient consent for audio or video type visit:  Yes  Patient does not have access to video visit:  Yes  Chief Complaint:   FMLA paperwork  .  History of Present Illness:   [name] is a 58 year old femal with history of scleroderma, cervical spon  dylosis, IgG4 deficiency, diabetes, HTN and HLD  with scheduled telehealth   visit to fill out FMLA paperwork. Patient has monthly infusions for sclerod  erma as well as frequent appointment with orthopedics and PT for management   of her chronic medical problems. During visit reviewed and completed FMLA   paperwork. Patient had no other concerns.   .  .  .  .  .  .  .  Family History: (reviewed)   Father: DMII, died of MI at 62  Mom: died of lung CA at 82  MGM and PGM: lung ca (smokers)  MGF and several other second degree relatives: brain aneurysms in their 49'  s.  2 maternal uncles:melanoma (one died of unknown cancer, one living)  brother recently diagnosed with systemic scleroderma c/b gangrene of fingers  .  Social History: (reviewed)   Non-smoker, rare etoh, no IVDU. 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 Separated from life partner of 20 years jan 20  20 and now planning to move south. No children. Always wears a seat belt.  .  .  .  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)  (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)  DIABETES MELLITUS, TYPE II (ICD-250.00) (ICD10-E11.9)  LONG-TERM (CURRENT) USE OF STEROIDS (ICD-V58.65) (ICD10-Z79.51)  SKIN SAGGING DUE TO WEIGHT LOSS (ICD-757.9) (ICD10-Q84.9)  TRANSAMINASES, SERUM, ELEVATED (ICD-790.4) (ICD10-R74.0)  OBESITY, BMI 30-34.9, ADULT (ICD-278.00) (ICD10-E66.9)      DIABETES MELLITUS, TYPE II, UNCONTROLLED (ICD-250.02) (ICD10-E11.65)      FATTY LIVER DISEASE (ICD-571.8) (ICD10-K76.0)  SCLERODERMA, 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)  HYPOTHYROIDISM (ICD-244.9) (ICD10-E03.9)  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) (ICD10-D  80.8)  SPINAL STENOSIS, 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)  HEPATITIS C EXPOSURE (HCV RNA NEGATIVE, 01/2014) (ICD-V02.62) (ICD10-Z20.5)  PAIN IN JOINT, HAND (ICD-719.44) (ICD10-M79.643)  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  Past Medical History (changes today):  Added new problem of SCLERODERMA (ICD-710.1) (ICD10-M34.9)  .  [name]  Medications (prior to today's visit):  HYDROXYCHLOROQUINE SULFATE 200 MG ORAL TABLET (HYDROXYCHLOROQUINE SULFATE)   one tablet oral daily; Route: ORAL  JARDIANCE 10 MG ORAL TABLET (EMPAGLIFLOZIN) Take 1 tablet by mouth once dai  ly; Route: ORAL  ROSUVASTATIN 10MG  TABLETS (ROSUVASTATIN CALCIUM) TAKE 1 TABLET BY MOUTH  EVE  RY DAY  LISINOPRIL 2.5MG  TABLETS (LISINOPRIL) TAKE 1 TABLET BY MOUTH ONCE DAILY  METFORMIN HCL ER 500 MG XR24H-TAB (METFORMIN HCL) TAKE 4 TABLETS BY MOUTH E  VERY MORNING  GLIPIZIDE ER 2.5 MG XR24H-TAB  (GLIPIZIDE) TAKE 1 TABLET BY MOUTH DAILY  OMEPRAZOLE 20 MG ORAL CAPSULE DELAYED RELEASE (OMEPRAZOLE) Take one capsule   by mouth twice a day; Route: ORAL  IBUPROFEN 600 MG ORAL TABLET (IBUPROFEN) 1 tab by mouth TID as needed for p  ain  BACLOFEN 10MG  TABLETS (BACLOFEN) TAKE 1/2 TABLET BY MOUTH EVERY DAY AS NEED  ED FOR BACK OR SPASMS  DICLOFENAC SODIUM 1 % TRANSDERMAL GEL (DICLOFENAC SODIUM) APP 2 GRAMS EXT A  A BID      FREESTYLE FREEDOM LITE w/Device KIT (BLOOD GLUCOSE MONITORING SUPPL) [place]  e as directed (ICD 10 E11.65)      FREESTYLE LITE BLOOD GLUCOSE STRIPS (GLUCOSE BLOOD) USE TO CHECK BLOOD   GLUCOSE THREE TIMES DAILY      FREESTYLE LANCETS (LANCETS) check fingerstick three times a day (ICD 10   E11.65)  TRAMADOL HCL 50 MG ORAL TABLET (TRAMADOL HCL) take one tablet daily as need  ed for pain; Route: ORAL  OXYCODONE HCL 5 MG ORAL TABLET (OXYCODONE HCL) Take one tablet daily as nee  ded for pain >8. Partial fill upon patient request.; Route: ORAL  CALCIUM CARBONATE-VITAMIN D 600-200 MG-UNIT ORAL TABLET (CALCIUM CARBONATE-  VITAMIN D) take one tablet twice a day; Route: ORAL  GABAPENTIN 300MG  CAPSULES (GABAPENTIN) TAKE 1 CAPSULE BY MOUTH EVERY NIGHT   AT BEDTIME AS NEEDED FOR PAIN  * BLOODWORK Please draw chemistry (sodium, potassium, magnesium, chloride,   bicarbonate, glucose, creatinine, BUN), CBC, TSH, lipids, HbA1c. Fax result   to [phone] Dr. Marlane Hatcher  PREDNISONE 10 MG ORAL TABLET (PREDNISONE) Take 3 tablets once a day; Route:   ORAL  FAMOTIDINE 20 MG ORAL TABLET (FAMOTIDINE) Take 1 tablet  at night as needed  .; Route: ORAL  BACTRIM 400-80 MG ORAL TABLET (SULFAMETHOXAZOLE-TRIMETHOPRIM) ; Route: ORAL  .  No Changes to Medication List   .  .  .  .  .  .  Vitals from Patient:   Ht: 62.5 in.    .  .  .  .  Additional PE:   Tele health visit  Assessment /T/ Plan:   1. FMLA paperwork / scleorderma   -Completed FMLA paperwork as patient requires monthly infusions for sclerod  erma as well as frequent  appointment with orthopedics and PT for management   of her chronic medical problems   .  [name]  I spent 20 minutes with the patient counseling and treating the above condi  tions.  Location of Provider:  Primary Care Office  Location of Patient:  Home  .  Orders (this visit):  Telephone Call Intermediate (11-20 min) [[id number]]  Telephone Call Intermediate (11-20 min) [[id number]]  .  **Preceptor Note**  .  .  Subjective   Patient is a 58 Years Old Female who presents to clinic with problems as ou  tlined in resident's note.     .  .  .  .  .  Patient Care Plan  .  [name]  Immunization Worksheet 2019 (rev 05/16/2018)   .  .  .  .  .  .  .  .  .  .  .  .  .  Follow-up With:   .  .  .  .  Electronically Signed by  Ruffin Frederick, MD on 02/20/2019 at 12:44 PM  ________________________________________________________________________

## 2019-02-21 ENCOUNTER — Ambulatory Visit: Admitting: Hand Surgery

## 2019-02-21 ENCOUNTER — Ambulatory Visit

## 2019-02-21 ENCOUNTER — Ambulatory Visit: Admit: 2019-02-21

## 2019-02-21 NOTE — Progress Notes (Signed)
 [name] [name], [name] [name] **DOB:** [birth year] (58 yo F) **Acc No.** [id number] **DOS:**  02/21/2019    ---       [name] [name], [name] [name]**    ------    [name], DOB: 1960/11/23, External MRN: [id number]    Account Number: [id number]    [address], [place], [id number]    Home: [phone]    Guarantor: [name] [name] Insurance: [id number] NHP PPO    PCP: [name] Goodie, MD Referring: [name] Goodie, MD    [facility]        * * *    02/21/2019 Progress Notes: [name] Numbers, MD **CHN#:** [id number]    ------    ---       **Reason for Appointment**    ---      1\. LEFT SHOULDER PAINI    ---      **History of Present Illness**    ---     _GENERAL_ :    overall she is doing fairly well, but she has episodes where she feels her  shoulder slip out of joint a bit. Never any dislocation, no grinding  sensation,.      **Current Medications**    ---    Taking    * Baclofen 10 MG Tablet 1/2 tab Orally prn, Notes: prn    ---    * Bactrim 400-80 MG Tablet 1 tablet Orally Once a day    ---    * Calcium Carbonate-Vitamin D 600-200 MG-UNIT Capsule 1 capsule with a meal Orally Twice a day    ---    * Famotidine 40 MG Tablet 1 tablet at bedtime Orally Once a day, Notes: prn    ---    * Gammagard - Solution as directed Injection once every 4 weeks, Notes: every 4 weeks    ---    * GaviLyte-C 240 GM Solution Reconstituted (If unavailable, may dispense Gavilyte-Nper MD) 4000 ml Orally Please follow Edgard instructions    ---    * GlipiZIDE XL 5 MG Tablet Extended Release 24 Hour 1 tablet Orally Once a day    ---    * Glucophage XR 500 mg Tablet Extended Release 24 Hour 4 tablets Orally Once a day    ---    * Hydroxychloroquine Sulfate 200 MG Tablet 1 tablet with food or milk Orally Once a day    ---    * Jardiance 10 MG Tablet 1 tablet Orally Once a day    ---    * Lidocaine 5 % Patch 1 patch remove after 12 hours Externally Once a day    ---    * Lisinopril 2.5 MG Tablet 1 tablet Orally  Once a day    ---    * Omeprazole 40 mg Capsule Delayed Release 1 capsule Orally Once a day    ---    * PredniSONE 10 MG Tablet TAKE 2 TABLETS BY MOUTH ONCE A DAY Orally as directed    ---    * PredniSONE 10 MG Tablet 3 tablet Orally Once a day    ---    * Rituxan 500 MG/50ML Solution as directed Intravenous , Notes: Last dose 11/29/18    ---    * Rosuvastatin Calcium 10 mg Tablet 1 tablet Orally Once a day    ---    * Tramadol HCl 50 MG Tablet 1 tablet Orally every 6 hrs, Notes: PRN - rarely takes    ---    *  Vitamin B Complex - Tablet 1 tablet Orally once daily    ---    * Vitamin D 25 MCG (1000 UT) Tablet 1 tablet Orally Once a day    ---    * Voltaren 1 % Gel 2 grams Transdermal twice daily    ---    * Medication List reviewed and reconciled with the patient    ---      **Past Medical History**    ---      Scleroderma - CREST dx 2007.        ---    IgG4 deficiency s/p IVIG 2010 ----single infusion given preventively after  week of bilateral knee replacements at [facility] 2010.        ---    Fx left wrist in 1994.        ---    Blood clot at LUE in 2006 on a short course of Coumadin.        ---    Bilateral carpal tunnel syndrome s/p Lt carpal tunnel release and steroids  injection right--.        ---    Bone spur left foot.        ---    Scoliosis.        ---    Spinal stenosis s/p steroids injection.        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 Katrinka Blazing 2010--- never infected, but packed with antibiotics with  surgery;.        ---    Right rotator cuff repairs x 4, complicated by repeat tears, infection,  placement of anchor material.        ---    Elevated liver function tests.        ---    Diabetes.        ---    Seronegative RA.        ---      **Surgical History**    ---      Right rotator cuff repair, with infected hardware that had to be removed  2006    ---    Repeat right shoulder surgery, also which became infected. 2007    ---    Left rotator cuff  repair 2008    ---    bilateral knee replacements 2009    ---    Left arthroscopic carpal tunnel release 01/2011    ---    ORIF Left 4th metatarsal bone    ---    Left Ulna shortening 1994    ---    Knee replacement 2010    ---    Hand surgery 2013, 2015    ---    remove gallbladder/hernia 1990    ---    Plate left foot 3rd metatarsal 2008    ---      **Family History**    ---      Mother: deceased 64 yrs, lung cancer, hyperthyroidism, diagnosed with Other  malignant neoplasm of unspecified site    ---    Father: deceased 62s yrs, heart attack, Unspecified heart disease    ---    3 brother(s) .    ---    FH of arthritis and scleroderma\nFather deceased from MI\nMother deceased age  42 with lung cancer \\nBrother  with scleroderma \/ copd.    ---      **Social History**    ---    Tobacco history: Never smoked.  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    Alcohol  Former daily EtOH use in 20s.  Nonsmoker.    Lives with longstanding boyfriend.    ---      **Allergies**    ---      N.K.D.A.    ---      **Hospitalization/Major Diagnostic Procedure**    ---      as above    ---     **Review of Systems**    ---     _ORT_ :    Eyes No. Ear, Nose Throat No. Digestion, Stomach, Bowel No. Bladder Problems  No. Bleeding Problems No. Numbness/Tingling No. Anxiety/Depression No.  Fever/Chills/Fatigue No. Chest Pain/Tightness/Palpitations No. Skin Rash No.  Dental Problems No. Joint/Muscle Pain/Cramps Yes. Blackout/Fainting No. Other  No.         **Vital Signs**    ---    Pain scale 4, Ht-in 62, Ht-cm 157.48.      **Physical Examination**    ---    appears healthy. left shoulder incision well healed. no swelling no obvious  subluxation, AROM 160 degrees forward elevation. Sensation intact to light  touch. fingers pink and warm with good cap refill. Xrays of left shoulder  taken today reviewed by me demonstrate well fixed humeral and glenoid  components. the glenosphere seems to be slightly eccentric in the  humeral  tray. THe poly ring seems to be well seated in the tray.      **Assessments**    ---    1\. 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 Pain in left shoulder - M25.512    ---    3\. Other chronic pain - G89.29    ---      **Treatment**    ---      **1\. Status post replacement of left shoulder joint**    Notes: I spoke with the company. Apparently there are cases where the poly  liner has become dissociated from the ring and the tray. I spoke wiht Dr. Felicity Coyer  ward to determine the best noninvasive study to evaluate for poly  dissociation,n and he recommended a CT with metal subtraction. She would like  to proceed and will followup after the study.    ---     **Follow Up**    ---    after CT    Electronically signed by [name] [name] , MD on 02/24/2019 at 01:33 PM EST    Sign off status: Completed        * * *        [facility]    [address]    [place], [address]    [place], [place] [address]    Tel: [phone]    Fax: [phone]              * * *          Progress Note: [name] Numbers, MD 02/21/2019    ---    Note generated by eClinicalWorks EMR/PM Software ([email or web address])

## 2019-02-21 NOTE — Progress Notes (Signed)
 .  Progress Notes  .  Patient: [name] [name]  Provider: Yetta Numbers    .  DOB:[birth year] Age: 58 Y Sex: Female  .  PCP: Ellin Goodie  MD  Date: 02/21/2019  .  --------------------------------------------------------------------------------  .  REASON FOR APPOINTMENT  .  1. LEFT SHOULDER PAINI  .  HISTORY OF PRESENT ILLNESS  .  GENERAL:   overall she is doing fairly well, but she has episodes where she  feels her shoulder slip out of joint a bit. Never any  dislocation, no grinding sensation,.  .  CURRENT MEDICATIONS  .  Taking Baclofen 10 MG Tablet 1/2 tab Orally prn, Notes: prn  Taking Bactrim 400-80 MG Tablet 1 tablet Orally Once a day  Taking Calcium Carbonate-Vitamin D 600-200 MG-UNIT Capsule 1  capsule with a meal Orally Twice a day  Taking Famotidine 40 MG Tablet 1 tablet at bedtime Orally Once a  day, Notes: prn  Taking Gammagard - Solution as directed Injection once every 4  weeks, Notes: every 4 weeks  Taking GaviLyte-C 240 GM Solution Reconstituted (If unavailable,  may dispense Gavilyte-Nper MD) 4000 ml Orally Please follow [place]  instructions  Taking GlipiZIDE XL 5 MG Tablet Extended Release 24 Hour 1 tablet  Orally Once a day  Taking Glucophage XR 500 mg Tablet Extended Release 24 Hour 4  tablets Orally Once a day  Taking Hydroxychloroquine Sulfate 200 MG Tablet 1 tablet with  food or milk Orally Once a day  Taking Jardiance 10 MG Tablet 1 tablet Orally Once a day  Taking Lidocaine 5 % Patch 1 patch remove after 12 hours  Externally Once a day  Taking Lisinopril 2.5 MG Tablet 1 tablet Orally Once a day  Taking Omeprazole 40 mg Capsule Delayed Release 1 capsule Orally  Once a day  Taking PredniSONE 10 MG Tablet TAKE 2 TABLETS BY MOUTH ONCE A DAY  Orally as directed  Taking PredniSONE 10 MG Tablet 3 tablet Orally Once a day  Taking Rituxan 500 MG/50ML Solution as directed Intravenous ,  Notes: Last dose 11/29/18  Taking Rosuvastatin Calcium 10 mg Tablet 1 tablet Orally Once a  day  Taking  Tramadol HCl 50 MG Tablet 1 tablet Orally every 6 hrs,  Notes: PRN - rarely takes  Taking Vitamin B Complex - Tablet 1 tablet Orally once daily  Taking Vitamin D 25 MCG (1000 UT) Tablet 1 tablet Orally Once a  day  Taking Voltaren 1 % Gel 2 grams Transdermal twice daily  Medication List reviewed and reconciled with the patient  .  PAST MEDICAL HISTORY  .  Scleroderma - CREST dx 2007  IgG4 deficiency s/p IVIG 2010 ----single infusion given  preventively after week of bilateral knee replacements at [facility]  2010  Fx left wrist in 1994  Blood clot at LUE in 2006 on a short course of Coumadin  Bilateral carpal tunnel syndrome s/p Lt carpal tunnel release and  steroids injection right--  Bone spur left foot  Scoliosis  Spinal stenosis s/p steroids injection  s/p bilateral knee replacements for valgus /arthritic  complications, performed by Dr. Katrinka Blazing 2010--- never infected, but  packed with antibiotics with surgery;  Right rotator cuff repairs x 4, complicated by repeat tears,  infection, placement of anchor material  Elevated liver function tests  Diabetes  Seronegative RA  .  ALLERGIES  .  N.K.D.A.  .  SURGICAL HISTORY  .  Right rotator cuff repair, with infected hardware that had to be  removed  2006  Repeat right shoulder surgery, also which became infected. 2007  Left rotator cuff repair 2008  bilateral knee replacements 2009  Left arthroscopic carpal tunnel release 01/2011  ORIF Left 4th metatarsal bone  Left Ulna shortening 1994  Knee replacement 2010  Hand surgery 2013, 2015  remove gallbladder/hernia 1990  Plate left foot 3rd metatarsal 2008  .  FAMILY HISTORY  .  Mother: deceased 25 yrs, lung cancer, hyperthyroidism, diagnosed  with Other malignant neoplasm of unspecified site  Father: deceased 103s yrs, heart attack, Unspecified heart disease  3 brother(s) .  FH of arthritis and scleroderma\nFather deceased from MI\nMother  deceased age 76 with lung cancer \\nBrother  with scleroderma \/  copd.  .  SOCIAL  HISTORY  .  .  Tobacco  history: Never smoked.  .  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  .  Alcohol Former daily EtOH use in 20s.  .  Nonsmoker.Lives with longstanding boyfriend.  [name]  HOSPITALIZATION/MAJOR DIAGNOSTIC PROCEDURE  .  as above  .  REVIEW OF SYSTEMS  .  ORT:  .  Eyes    No . Ear, Nose Throat    No . Digestion, Stomach, Bowel     No . Bladder Problems    No . Bleeding Problems    No .  Numbness/Tingling    No . Anxiety/Depression    No .  Fever/Chills/Fatigue    No . Chest Pain/Tightness/Palpitations     No . Skin Rash    No . Dental Problems    No . Joint/Muscle  Pain/Cramps    Yes . Blackout/Fainting    No . Other    No .  .  VITAL SIGNS  .  Pain scale 4, Ht-in 62, Ht-cm 157.48.  [name]  PHYSICAL EXAMINATION  .  appears healthy. left shoulder incision well healed. no swelling  no obvious subluxation, AROM 160 degrees forward elevation.  Sensation intact to light touch. fingers pink and warm with good  cap refill. Xrays of left shoulder taken today reviewed by me  demonstrate well fixed humeral and glenoid components. the  glenosphere seems to be slightly eccentric in the humeral tray.  THe poly ring seems to be well seated in the tray.  .  ASSESSMENTS  .  Status post replacement of left shoulder joint - [phone]  (Primary)  .  Pain in left shoulder - M25.512  .  Other chronic pain - G89.29  .  TREATMENT  .  Status post replacement of left shoulder joint  Notes: I spoke with the company. Apparently there are cases where  the poly liner has become dissociated from the ring and the tray.  I spoke wiht Dr. Felicity Coyer ward to determine the best noninvasive  study to evaluate for poly dissociation,n and he recommended a CT  with metal subtraction. She would like to proceed and will  followup after the study.  .  FOLLOW UP  .  after CT  .  Electronically signed by Yetta Numbers , MD on  02/24/2019 at 01:33 PM EST  .  Document electronically signed by Yetta Numbers    .

## 2019-03-06 ENCOUNTER — Ambulatory Visit: Admitting: Rheumatology

## 2019-03-06 ENCOUNTER — Ambulatory Visit: Admit: 2019-03-06

## 2019-03-14 ENCOUNTER — Ambulatory Visit

## 2019-03-14 ENCOUNTER — Ambulatory Visit: Admitting: Rheumatology

## 2019-03-14 ENCOUNTER — Ambulatory Visit: Admit: 2019-03-14

## 2019-03-14 NOTE — Progress Notes (Signed)
 .  Progress Notes  .  Patient: [name], [name] [facility]  Provider: [name]  DOB:Aug 29, 1960 Age: 58 Y Sex: Female  .  PCP: Ellin Goodie  MD  Date: 03/14/2019  .  --------------------------------------------------------------------------------  .  HISTORY OF PRESENT ILLNESS  .  GENERAL:  She is doing very poorly. She has  significant pain, stiffness and swelling in the hands, she thinks  it could be a bit better but not sure if that is the rituximab or  the steroids. Her Raynaud continues to act up. she is struggling  to do ADLs. She tolerated rituximab, completing her first course  of treatment about 10 weeks ago. She has had some weight gain  from steroids but no other side effects. She still has plans to  move south to warmer weather. She plans to relocate to the  [facility] area. She has not had any recent infections and  she has been socially distant and wearing masks.  .  CURRENT MEDICATIONS  .  Taking Baclofen 10 MG Tablet 1/2 tab Orally prn, Notes: prn  Taking Bactrim 400-80 MG Tablet 1 tablet Orally Once a day  Taking Calcium Carbonate-Vitamin D 600-200 MG-UNIT Capsule 1  capsule with a meal Orally Twice a day  Taking Famotidine 40 MG Tablet 1 tablet at bedtime Orally Once a  day, Notes: prn  Taking Gammagard - Solution as directed Injection once every 4  weeks, Notes: every 4 weeks  Taking GaviLyte-C 240 GM Solution Reconstituted (If unavailable,  may dispense Gavilyte-Nper MD) 4000 ml Orally Please follow [place]  instructions  Taking GlipiZIDE XL 5 MG Tablet Extended Release 24 Hour 1 tablet  Orally Once a day  Taking Glucophage XR 500 mg Tablet Extended Release 24 Hour 4  tablets Orally Once a day  Taking Hydroxychloroquine Sulfate 200 MG Tablet 1 tablet with  food or milk Orally Once a day  Taking Jardiance 10 MG Tablet 1 tablet Orally Once a day  Taking Lidocaine 5 % Patch 1 patch remove after 12 hours  Externally Once a day  Taking Lisinopril 2.5 MG Tablet 1 tablet Orally Once a  day  Taking Omeprazole 40 mg Capsule Delayed Release 1 capsule Orally  Once a day  Taking PredniSONE 10 MG Tablet TAKE 2 TABLETS BY MOUTH ONCE A DAY  Orally as directed  Taking PredniSONE 10 MG Tablet 3 tablet Orally Once a day  Taking Rituxan 500 MG/50ML Solution as directed Intravenous ,  Notes: Last dose 11/29/18  Taking Rosuvastatin Calcium 10 mg Tablet 1 tablet Orally Once a  day  Taking Tramadol HCl 50 MG Tablet 1 tablet Orally every 6 hrs,  Notes: PRN - rarely takes  Taking Vitamin B Complex - Tablet 1 tablet Orally once daily  Taking Vitamin D 25 MCG (1000 UT) Tablet 1 tablet Orally Once a  day  Taking Voltaren 1 % Gel 2 grams Transdermal twice daily  Medication List reviewed and reconciled with the patient  .  PAST MEDICAL HISTORY  .  Scleroderma - CREST dx 2007  IgG4 deficiency s/p IVIG 2010 ----single infusion given  preventively after week of bilateral knee replacements at [facility]  2010  Fx left wrist in 1994  Blood clot at LUE in 2006 on a short course of Coumadin  Bilateral carpal tunnel syndrome s/p Lt carpal tunnel release and  steroids injection right--  Bone spur left foot  Scoliosis  Spinal stenosis s/p steroids injection  s/p bilateral knee replacements for valgus /  arthritic  complications, performed by Dr. Katrinka Blazing 2010--- never infected, but  packed with antibiotics with surgery;  Right rotator cuff repairs x 4, complicated by repeat tears,  infection, placement of anchor material  Elevated liver function tests  Diabetes  Seronegative RA  .  ALLERGIES  .  N.K.D.A.  .  SOCIAL HISTORY  .  .  Tobacco  history: Never smoked.  .  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  .  Alcohol Former daily EtOH use in 20s.  .  Nonsmoker.Lives with longstanding boyfriend.  [name]  REVIEW OF SYSTEMS  .  Rheumatology:  .  General    No fever, weight loss, swollen glands . Muscoskeletal     +Joint pain, +joint swelling, +morning stiffness  . Eyes    No  vision loss, eye dry, red eye, eye pain . Mouth    No mouth  sores  . Cardiovascular    ? raynaud, no chest pain , irregular heart  beat, racing heart beat, leg swelling . Pulmonary    No cough,  cough blood, shortness of breath . Skin    No rash,  photosensitivity . Lymphatics    No swollen glands, tender glands  .  [name]  VITAL SIGNS  .  Pain scale 4, Ht-in 62, Wt-lbs 152, BMI 27.80, BP 118/80, HR 98,  RR 16, O2 98, Ht-cm 157.48, Wt-kg 68.95.  [name]  EXAMINATION  .  Rheumatology:  General Appearance: Alert and oriented , No apparent distress .  Eyes: No, scleral icterus, scleral erythema .  Pulm:Clear to auscultation bilaterally, no  crackles/wheezes/rhonchi.  AbdomenSoft, nondistended.  Skin:No rashes or nail changes.  MuskuloskeletalElbows, shoulders, hips, knees have intact ROM  with no synovitis. Hands have synovitis in the 2-3 PIPs and MCPs  bilaterally. Positive MCP squeeze b/l. Heberden's node to R2 DIP.  Skin and NailsMinimal sclerodactyly, some cracking atthe  fingertips, but no ulcers.  .  ASSESSMENTS  .  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  Scleroderma - M34.9  .  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  Long-term use of high-risk medication - Z79.899  .  She continues to do poorly, and is now 10 weeks out from  rituximab dose. I would like to taper steroids however I don't  think she will tolerate it. I would like to go one more month and  see if she gets any better. Because of her CVID and high  prednisone dose, I recommend she take Bactrim for PCP prophylaxis  as well. I will plan to see her back in 1 month before she leaves  for [place]. She is also due for PFTs and echo to evaluate for  the presence of cardiopulmonary complications of scleroderma. She  will also continue monthly IVIG infusions to treat CVID. SHe is  at high risk for infection with the treatments plus CVID,  especially during the pandemic. I encouraged her to maintain safe  practices during the pandemic.  [name]  TREATMENT  .  Seronegative rheumatoid arthritis  Continue  Bactrim Tablet, 400-80 MG, 1 tablet, Orally, Once a day  Continue Gammagard Solution, -, as directed, Injection, once  every 4 weeks, Notes: every 4 weeks  Continue Hydroxychloroquine Sulfate Tablet, 200 MG, 1 tablet with  food or milk, Orally, Once a day  Continue Lidocaine Patch, 5 %, 1 patch remove after 12 hours,  Externally, Once a day  Continue Omeprazole Capsule Delayed Release, 40 mg, 1 capsule,  Orally, Once a day  Continue  PredniSONE Tablet, 10 MG, 3 tablet, Orally, Once a day  Continue Rituxan Solution, 500 MG/50ML, as directed, Intravenous,  Notes: Last dose 11/29/18  .  FOLLOW UP  .  4 Weeks  .  Electronically signed by Erasmo Leventhal , MD on  04/02/2019 at 10:50 AM EST  .  Document electronically signed by [name]

## 2019-03-14 NOTE — Progress Notes (Signed)
 [name], [name] **DOB:** Mar 22, 1961 (58 yo F) **Acc No.** [id number] **DOS:**  03/14/2019    ---       [name], [name]**    ------    51 Y old Female, DOB: 12-May-1960, External MRN: [id number]    Account Number: [id number]    [address] [place], [id number]    Home: [phone]    Guarantor: [name] Insurance: [id number] NH[employer] PPO    PCP: Ellin Goodie, MD Referring: Ellin Goodie, MD    Appointment Facility: Rheumatology, Allergy and Immunology        * * *    03/14/2019 Progress Notes: [name], MD **CHN#:** [id number]    ------    ---       **History of Present Illness**    ---     _GENERAL_ :    She is doing very poorly. She has significant pain, stiffness and swelling in  the hands, she thinks it could be a bit better but not sure if that is the  rituximab or the steroids. Her Raynaud continues to act up. she is struggling  to do ADLs. She tolerated rituximab, completing her first course of treatment  about 10 weeks ago. She has had some weight gain from steroids but no other  side effects. She still has plans to move south to warmer weather. She plans  to relocate to the [facility] area. She has not had any recent infections  and she has been socially distant and wearing masks.      **Current Medications**    ---    Taking    * Baclofen 10 MG Tablet 1/2 tab Orally prn, Notes: prn    ---    * Bactrim 400-80 MG Tablet 1 tablet Orally Once a day    ---    * Calcium Carbonate-Vitamin D 600-200 MG-UNIT Capsule 1 capsule with a meal Orally Twice a day    ---    * Famotidine 40 MG Tablet 1 tablet at bedtime Orally Once a day, Notes: prn    ---    * Gammagard - Solution as directed Injection once every 4 weeks, Notes: every 4 weeks    ---    * GaviLyte-C 240 GM Solution Reconstituted (If unavailable, may dispense Gavilyte-Nper MD) 4000 ml Orally Please follow [place] instructions    ---    * GlipiZIDE XL 5 MG Tablet Extended Release 24 Hour 1 tablet Orally Once a day    ---    *  Glucophage XR 500 mg Tablet Extended Release 24 Hour 4 tablets Orally Once a day    ---    * Hydroxychloroquine Sulfate 200 MG Tablet 1 tablet with food or milk Orally Once a day    ---    * Jardiance 10 MG Tablet 1 tablet Orally Once a day    ---    * Lidocaine 5 % Patch 1 patch remove after 12 hours Externally Once a day    ---    * Lisinopril 2.5 MG Tablet 1 tablet Orally Once a day    ---    * Omeprazole 40 mg Capsule Delayed Release 1 capsule Orally Once a day    ---    * PredniSONE 10 MG Tablet TAKE 2 TABLETS BY MOUTH ONCE A DAY Orally as directed    ---    * PredniSONE 10 MG Tablet 3 tablet Orally Once a day    ---    *  Rituxan 500 MG/50ML Solution as directed Intravenous , Notes: Last dose 11/29/18    ---    * Rosuvastatin Calcium 10 mg Tablet 1 tablet Orally Once a day    ---    * Tramadol HCl 50 MG Tablet 1 tablet Orally every 6 hrs, Notes: PRN - rarely takes    ---    * Vitamin B Complex - Tablet 1 tablet Orally once daily    ---    * Vitamin D 25 MCG (1000 UT) Tablet 1 tablet Orally Once a day    ---    * Voltaren 1 % Gel 2 grams Transdermal twice daily    ---    * Medication List reviewed and reconciled with the patient    ---      **Past Medical History**    ---      Scleroderma - CREST dx 2007.        ---    IgG4 deficiency s/p IVIG 2010 ----single infusion given preventively after  week of bilateral knee replacements at [facility] 2010.        ---    Fx left wrist in 1994.        ---    Blood clot at LUE in 2006 on a short course of Coumadin.        ---    Bilateral carpal tunnel syndrome s/p Lt carpal tunnel release and steroids  injection right--.        ---    Bone spur left foot.        ---    Scoliosis.        ---    Spinal stenosis s/p steroids injection.        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 Katrinka Blazing 2010--- never infected, but packed with antibiotics with  surgery;.        ---    Right rotator cuff repairs x 4, complicated by repeat tears,  infection,  placement of anchor material.        ---    Elevated liver function tests.        ---    Diabetes.        ---    Seronegative RA.        ---      **Social History**    ---    Tobacco history: Never smoked.    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    Alcohol  Former daily EtOH use in 20s.  Nonsmoker.    Lives with longstanding boyfriend.    ---      **Allergies**    ---      N.K.D.A.    ---      **Review of Systems**    ---     _Rheumatology_ :    General No fever, weight loss, swollen glands. Muscoskeletal +Joint pain,  +joint swelling, +morning stiffness . Eyes No vision loss, eye dry, red eye,  eye pain. Mouth No mouth sores. Cardiovascular ? raynaud, no chest pain ,  irregular heart beat, racing heart beat, leg swelling. Pulmonary No cough,  cough blood, shortness of breath. Skin No rash, photosensitivity. Lymphatics  No swollen glands, tender glands.         **Vital Signs**    ---    Pain scale 4, Ht-in 62, Wt-lbs 152, BMI 27.80, BP 118/80, HR 98, RR 16, O2 98,  Ht-cm 157.48, Wt-kg 68.95.      **  Examination**    ---     _Rheumatology:_    General Appearance:  Alert and oriented , No apparent distress .    Eyes:  No, scleral icterus, scleral erythema .    Pulm: Clear to auscultation bilaterally, no crackles/wheezes/rhonchi.    Abdomen Soft, nondistended.    Skin: No rashes or nail changes.    Muskuloskeletal Elbows, shoulders, hips, knees have intact ROM with no  synovitis. Hands have synovitis in the 2-3 PIPs and MCPs bilaterally. Positive  MCP squeeze b/l. Heberden's node to R2 DIP.    Skin and Nails Minimal sclerodactyly, some cracking atthe fingertips, but no  ulcers.         **Assessments**    ---    1\. 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 Scleroderma - M34.9    ---    3\. 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 Long-term use of high-risk medication - Z79.899    ---     She continues to do poorly, and is now 10 weeks out  from rituximab dose. I  would like to taper steroids however I don't think she will tolerate it. I  would like to go one more month and see if she gets any better. Because of her  CVID and high prednisone dose, I recommend she take Bactrim for PCP  prophylaxis as well. I will plan to see her back in 1 month before she leaves  for [place]. She is also due for PFTs and echo to evaluate for the presence of  cardiopulmonary complications of scleroderma. She will also continue monthly  IVIG infusions to treat CVID. SHe is at high risk for infection with the  treatments plus CVID, especially during the pandemic. I encouraged her to  maintain safe practices during the pandemic.    ---      **Treatment**    ---      **1\. Seronegative rheumatoid arthritis**    Continue Bactrim Tablet, 400-80 MG, 1 tablet, Orally, Once a day    Continue Gammagard Solution, -, as directed, Injection, once every 4 weeks,  Notes: every 4 weeks    Continue Hydroxychloroquine Sulfate Tablet, 200 MG, 1 tablet with food or  milk, Orally, Once a day    Continue Lidocaine Patch, 5 %, 1 patch remove after 12 hours, Externally, Once  a day    Continue Omeprazole Capsule Delayed Release, 40 mg, 1 capsule, Orally, Once a  day    Continue PredniSONE Tablet, 10 MG, 3 tablet, Orally, Once a day    Continue Rituxan Solution, 500 MG/50ML, as directed, Intravenous, Notes: Last  dose 11/29/18    ---      **Follow Up**    ---    4 Weeks    Electronically signed by Erasmo Leventhal , MD on 04/02/2019 at 10:50 AM EST    Sign off status: Completed        * * *        Rheumatology, Allergy and Immunology    [address]    [address]    [place], [place] [address]    Tel: [phone]    Fax: [phone]              * * *          Progress Note: [name], MD 03/14/2019    ---    Note generated by eClinicalWorks  EMR/PM Software ([email or web address])

## 2019-03-14 NOTE — Progress Notes (Signed)
* * *      [name], [name] **DOB:** [birth year] (58 yo F) **Acc No.** [id number] **DOS:**  03/14/2019    ---       [name], [name]**    ------    37 Y old Female, DOB: [birth year]    [address] [place], [place] [address]    Home: [phone]    Provider: [name]        * * *    Telephone Encounter    ---    Answered by  Denton Meek Date: 03/14/2019       Time: 01:09 PM    Reason  FMLA    ------            Message                     Pt would like to send me FMLA paperwork.                  Action Taken                     [name]  03/14/2019 1:10:11 PM > Waiting on paperwork.      [name],[name]  03/17/2019 2:00:18 PM > Forms completed and faxed to provided #.  Will add to pt docs next time I am in clinic.  Spoke with pt.  She will call me if she needs anythign else.                    * * *                ---          * * *         Provider: [name] 03/14/2019    ---    Note generated by eClinicalWorks EMR/PM Software ([email or web address])

## 2019-03-19 ENCOUNTER — Ambulatory Visit: Admitting: Rheumatology

## 2019-03-19 ENCOUNTER — Ambulatory Visit

## 2019-03-19 MED ORDER — GABAPENTIN: 1 | 30 | 1 refills | 0 days | Status: AC

## 2019-03-19 MED ORDER — BACLOFEN: 0.5 | 20 | 1 refills | 0 days | Status: AC

## 2019-03-19 NOTE — Telephone Encounter (Signed)
 Call Details:   Patient PCP = [name] -JN 6A-  [name] (Patient) called on March 19, 2019 3:16 PM.  Message taken by: Bartholomew Crews  Primary call-back number: [phone]    Secondary call-back number: [phone] -    Call Reason(s): Prescription        ** REQUESTING A PRESCRIPTION OR REFILL.  Preferred Pharmacy: [facility] [phone]*  [address] [facility] [place]  Phone: [phone]  Fax: [phone]    Provide Prescription to Pharmacy  Medications to refill:  Baclofen 10mg  tablets : TAKE 1/2 TABLET BY MOUTH EVERY DAY AS NEEDED FOR BACK OR SPASMS  Gabapentin 300mg  capsules : TAKE 1 CAPSULE BY MOUTH EVERY NIGHT AT BEDTIME AS NEEDED FOR PAIN    ---------- ---------- ---------- ---------- ---------- ----------     Medications:  GABAPENTIN 300MG  CAPSULES (GABAPENTIN) TAKE 1 CAPSULE BY MOUTH EVERY NIGHT AT BEDTIME AS NEEDED FOR PAIN  #30[Capsule] x 1   Entered by: Kandis Fantasia, RN   Authorized by: [name] -JN 6A-   Signed by: Kandis Fantasia, RN on 03/19/2019   Method used: Electronically to      [facility] [phone]* (retail)     [address] [place], [place]  [address]     Ph: [phone]     Fax: [phone]   RxID: [id number]  BACLOFEN 10MG  TABLETS (BACLOFEN) TAKE 1/2 TABLET BY MOUTH EVERY DAY AS NEEDED FOR BACK OR SPASMS  #20[Tablet] x 1   Entered by: Kandis Fantasia, RN   Authorized by: [name] -JN 6A-   Signed by: Kandis Fantasia, RN on 03/19/2019   Method used: Electronically to      [id number] (retail)     [address] [place], [place]  [address]     Ph: [phone]     Fax: [phone]   RxID: [id number]      RESPONSE/ORDERS:                 ORDERS/PROBS/MEDS/ALL     Problems:   SCLERODERMA (ICD-710.1) (ICD10-M34.9)  SCREENING FOR CERVICAL CANCER (ICD-V76.2) (GXE15-F73.4)  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) (ICD10-M47.812)  NECK PAIN (ICD-723.1) (ICD10-M54.2)  STEROID USE, LONG TERM (ICD-V58.65) (ICD10-Z79.52)  PREMATURE VENTRICULAR CONTRACTIONS (ICD-427.69)  (ICD10-I49.3)  HYPERLIPIDEMIA (ICD-272.4) (ICD10-E78.5)  BACK PAIN, LUMBAR, CHRONIC (ICD-724.2) (ICD10-M54.5)  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)  DIABETES MELLITUS, TYPE II (ICD-250.00) (ICD10-E11.9)  LONG-TERM (CURRENT) USE OF STEROIDS (ICD-V58.65) (ICD10-Z79.51)  SKIN SAGGING DUE TO WEIGHT LOSS (ICD-757.9) (ICD10-Q84.9)  TRANSAMINASES, SERUM, ELEVATED (ICD-790.4) (ICD10-R74.0)  OBESITY, BMI 30-34.9, ADULT (ICD-278.00) (ICD10-E66.9)      DIABETES MELLITUS, TYPE II, UNCONTROLLED (ICD-250.02) (ICD10-E11.65)      FATTY LIVER DISEASE (ICD-571.8) (ICD10-K76.0)  SCLERODERMA, 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)  HYPOTHYROIDISM (ICD-244.9) (ICD10-E03.9)  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) (ICD10-D80.8)  SPINAL STENOSIS, 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)  HEPATITIS C EXPOSURE (HCV RNA NEGATIVE, 01/2014) (ICD-V02.62) (ICD10-Z20.5)  PAIN IN JOINT, HAND (ICD-719.44) (ICD10-M79.643)  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)  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)  CARPAL TUNNEL (ICD-354.0) (ICD10-G56.00)  OSTEOARTHRITIS (ICD-715.09) (ICD10-M15.9)  S/P ROTATOR CUFF SURGERY 12/2004 - RT SHOULDER; 2008 LT (ICD-V45.89)  Family Hx of MELANOMA, FAMILY HX (ICD-V16.8) (ICD10-Z80.8)  FRACTURE OF OTHER SPEC SITE,  PATHOLOGIC - MULTIPLE (ICD-733.19)  CHOLECYSTECTOMY AND HERNIA REPAIR (ICD-V45.89)  VITAMIN D DEFICIENCY (ICD-268.9) (ICD10-E55.9)  COLONOSCOPY, NEXT 2020- SEE COMMENT (ICD-V76.51) (ICD10-Z01.89)    Meds (prior to this call):   HYDROXYCHLOROQUINE SULFATE 200 MG ORAL TABLET (  HYDROXYCHLOROQUINE SULFATE) one tablet oral daily; Route: ORAL  JARDIANCE 10 MG ORAL TABLET (EMPAGLIFLOZIN) Take 1 tablet by mouth once daily; Route: ORAL  ROSUVASTATIN 10MG  TABLETS (ROSUVASTATIN CALCIUM) TAKE 1 TABLET BY MOUTH EVERY DAY  LISINOPRIL 2.5MG  TABLETS (LISINOPRIL) TAKE 1 TABLET BY MOUTH ONCE DAILY  METFORMIN HCL ER 500 MG XR24H-TAB (METFORMIN HCL) TAKE 4 TABLETS BY MOUTH EVERY MORNING  GLIPIZIDE ER  2.5 MG XR24H-TAB (GLIPIZIDE) TAKE 1 TABLET BY MOUTH DAILY  OMEPRAZOLE 20 MG ORAL CAPSULE DELAYED RELEASE (OMEPRAZOLE) Take one capsule by mouth twice a day; Route: ORAL  IBUPROFEN 600 MG ORAL TABLET (IBUPROFEN) 1 tab by mouth TID as needed for pain  BACLOFEN 10MG  TABLETS (BACLOFEN) TAKE 1/2 TABLET BY MOUTH EVERY DAY AS NEEDED FOR BACK OR SPASMS  DICLOFENAC SODIUM 1 % TRANSDERMAL GEL (DICLOFENAC SODIUM) APP 2 GRAMS EXT AA BID      FREESTYLE FREEDOM LITE w/Device KIT (BLOOD GLUCOSE MONITORING SUPPL) use as directed (ICD 10 E11.65)      FREESTYLE LITE BLOOD GLUCOSE STRIPS (GLUCOSE BLOOD) USE TO CHECK BLOOD GLUCOSE THREE TIMES DAILY      FREESTYLE LANCETS (LANCETS) check fingerstick three times a day (ICD 10 E11.65)  TRAMADOL HCL 50 MG ORAL TABLET (TRAMADOL HCL) take one tablet daily as needed for pain; Route: ORAL  OXYCODONE HCL 5 MG ORAL TABLET (OXYCODONE HCL) Take one tablet daily as needed for pain >8. Partial fill upon patient request.; Route: ORAL  CALCIUM CARBONATE-VITAMIN D 600-200 MG-UNIT ORAL TABLET (CALCIUM CARBONATE-VITAMIN D) take one tablet twice a day; Route: ORAL  GABAPENTIN 300MG  CAPSULES (GABAPENTIN) TAKE 1 CAPSULE BY MOUTH EVERY NIGHT AT BEDTIME AS NEEDED FOR PAIN  * BLOODWORK Please draw chemistry (sodium, potassium, magnesium, chloride, bicarbonate, glucose, creatinine, BUN), CBC, TSH, lipids, HbA1c. Fax result to [phone] Dr. Marlane Hatcher  PREDNISONE 10 MG ORAL TABLET (PREDNISONE) Take 3 tablets once a day; Route: ORAL  FAMOTIDINE 20 MG ORAL TABLET (FAMOTIDINE) Take 1 tablet  at night as needed.; Route: ORAL  BACTRIM 400-80 MG ORAL TABLET (SULFAMETHOXAZOLE-TRIMETHOPRIM) ; Route: ORAL    Changes to Meds (this update):   Rx of BACLOFEN 10MG  TABLETS (BACLOFEN) TAKE 1/2 TABLET BY MOUTH EVERY DAY AS NEEDED FOR BACK OR SPASMS;  #20[Tablet] x 1;  Signed;  Entered by: Kandis Fantasia, RN;  Authorized by: [name] -JN 6A-;  Method used: Electronically to [facility] [phone]*, [address] [place],  [place]  [address], Ph: [phone], Fax: [phone]  Rx of GABAPENTIN 300MG  CAPSULES (GABAPENTIN) TAKE 1 CAPSULE BY MOUTH EVERY NIGHT AT BEDTIME AS NEEDED FOR PAIN;  #30[Capsule] x 1;  Signed;  Entered by: Kandis Fantasia, RN;  Authorized by: [name] -JN 6A-;  Method used: Electronically to [facility] [phone]*, [address], [place], [place]  [address], Ph: [phone], Fax: [phone]          Created By Bartholomew Crews on 03/19/2019 at 03:16 PM    Electronically Signed By Karlyn Agee RN on 03/19/2019 at 03:27 PM

## 2019-03-19 NOTE — Progress Notes (Signed)
 [name], [name] **DOB:** 1961-02-02 (58 yo F) **Acc No.** [id number] **DOS:**  03/19/2019    ---       [name], [name]**    ------    [name], DOB: 01-May-1960    [address], [place], [place] [address]    Home: [phone]    Provider: Judy [name]        * * *    Telephone Encounter    ---    Answered by  [name] Date: 03/19/2019       Time: 03:03 PM    Reason  *** Surgery/rituxin    ------            Message                     Pt called to let us know she had a tendon rupture of one of her hamstring muscles while moving out of her house.  She is seeing orthopedics Friday for evaluation and then will call me to let me know the result of their evaluation and plan.              -I will talk with [name] about current medication regimen and if any changes need to be made.                 Action Taken                     [facility]  03/19/2019 3:06:26 PM > [name] Friday.  Tendon rupture probably from prednisone.  Will see what orthos plan is.  Keep in mind may need to change prednisone dose if needs surgery.  Pt told me she will call me after ortho appt to touch base.      [name]  03/24/2019 2:27:57 PM > MRI Wednesday.  Will discuss plan wtih [name] once pt has f/u with ortho and knows plan.      [name]  03/31/2019 11:51:17 AM > Labs reviewed with [name].      [name]  04/01/2019 10:13:40 AM > Patient would like a call back, she followed up with [name] and her labs are in her chart.      [name]  04/01/2019 11:40:01 AM > Surgery Monday 04/06/18.  [name] says continue prednisone and plaquenil.  Other option is to give stress dose before and after but that's second option.      [name]  04/01/2019 12:41:51 PM > F/u with [place] Feb 26th.  Infusion for rituxin 4-6 weeks after surgery.  Will contact otho PA to touch base.      [name]  04/01/2019 12:54:01 PM > Left message for ortho PA to call me.  Will then  send ot [name] for rituxin infusions.      [name]  04/01/2019 1:56:15 PM > Spoke with PA from ortho.  They are in agreement that pt should stay on prednisone since she has been on it for a long period of time.  Also okay to give rituxin infusion 6 weeks post op.  They will let us know if there are any issues with postop healing at 2 week follow up appt.       [name]  04/01/2019 1:59:22 PM > Hi [name].  Pt needs to schedule her rituximab infusion for 4-6 weeks post op.  Surgery scheduled for 04/07/19.  She has f/u appt with ortho on 04/22/19.  Should be  okay (as long as wound is healing appropriately at 2 week post op f/u) to go forward with infusions right?      [name] , [name] 04/01/2019 2:27:11 PM > Yes! That sounds reasonable. I have her on my list to renew since she was due in Feb anyways. I can renew it closer to time after the new year. Thanks!      [name]  04/01/2019 3:59:45 PM > Thank you [name]!                      * * *                ---          * * *         Provider: Judy [name] 03/19/2019    ---    Note generated by eClinicalWorks EMR/PM Software ([email or web address])

## 2019-03-21 ENCOUNTER — Ambulatory Visit

## 2019-03-21 ENCOUNTER — Ambulatory Visit: Admitting: Sports Medicine

## 2019-03-21 ENCOUNTER — Ambulatory Visit: Admitting: Orthopaedic Surgery

## 2019-03-21 ENCOUNTER — Ambulatory Visit: Admit: 2019-03-21

## 2019-03-21 NOTE — Progress Notes (Signed)
* * *      [name], [name] [name] **DOB:** September 21, 1960 (58 yo F) **Acc No.** [id number] **DOS:**  03/21/2019    ---       [name], [name]**    ------    30 Y old Female, DOB: Jul 22, 1960    [address], [place], [place] [address]    Home: [phone]    Provider: Rhoderick Moody        * * *    Telephone Encounter    ---    Answered by  [name] Date: 03/21/2019       Time: 01:03 PM    Caller  Pt    ------            Reason  Late            Message                     Patient has an appointment for 1:15 pm , patient is close by but has a flat waiting for [employer] to arrive. Patient stated she should be here before 1:30 pm thank you                 Action Taken                     Victorino,Heidi  03/21/2019 1:08:16 PM >                    * * *                ---          * * *         Provider: Rhoderick Moody 03/21/2019    ---    Note generated by eClinicalWorks EMR/PM Software ([email or web address])

## 2019-03-21 NOTE — Progress Notes (Signed)
 .  Progress Notes  .  Patient: [name], [name] [facility]  Provider: Charlie Pitter  DOB: [birth year] Age: 58 Y Sex: Female  Supervising Provider:: Rhoderick Moody  Date: 03/21/2019  .  PCP: Ellin Goodie  MD  Date: 03/21/2019  .  --------------------------------------------------------------------------------  .  REASON FOR APPOINTMENT  .  1. L. HAMSTRING  .  HISTORY OF PRESENT ILLNESS  .  GENERAL:   66F with PMH significant for scleroderma, on chronic prednisone  which was recently increased, who presents for evaluation of ?  left hamstring rupture following an injury on 03/15/19. She  reports she squatted down to pick something up and heard a pop in  her proximal posterior thigh. Pain improves with heat and no  activity. Pain worsens with any movement and walking. She states  her gait has been abnormal since the injury where she has to lift  her leg up higher prior to placing it down. She endorses  significant ecchymosis and swelling about the posterior thigh.  Denies prior injury in this area but has previously ruptured her  right Achilles tendon.  .  CURRENT MEDICATIONS  .  Taking Baclofen 10 MG Tablet 1/2 tab Orally prn, Notes: prn  Taking Bactrim 400-80 MG Tablet 1 tablet Orally Once a day  Taking Calcium Carbonate-Vitamin D 600-200 MG-UNIT Capsule 1  capsule with a meal Orally Twice a day  Taking Famotidine 40 MG Tablet 1 tablet at bedtime Orally Once a  day, Notes: prn  Taking Gammagard - Solution as directed Injection once every 4  weeks, Notes: every 4 weeks  Taking GaviLyte-C 240 GM Solution Reconstituted (If unavailable,  may dispense Gavilyte-Nper MD) 4000 ml Orally Please follow [place]  instructions  Taking GlipiZIDE XL 5 MG Tablet Extended Release 24 Hour 1 tablet  Orally Once a day  Taking Glucophage XR 500 mg Tablet Extended Release 24 Hour 4  tablets Orally Once a day  Taking Hydroxychloroquine Sulfate 200 MG Tablet 1 tablet with  food or milk Orally Once a day  Taking Jardiance 10 MG Tablet 1  tablet Orally Once a day  Taking Lidocaine 5 % Patch 1 patch remove after 12 hours  Externally Once a day  Taking Lisinopril 2.5 MG Tablet 1 tablet Orally Once a day  Taking Omeprazole 40 mg Capsule Delayed Release 1 capsule Orally  Once a day  Taking PredniSONE 10 MG Tablet 3 tablet Orally Once a day  Taking Rituxan 500 MG/50ML Solution as directed Intravenous ,  Notes: Last dose 11/29/18  Taking Rosuvastatin Calcium 10 mg Tablet 1 tablet Orally Once a  day  Taking Tramadol HCl 50 MG Tablet 1 tablet Orally every 6 hrs,  Notes: PRN - rarely takes  Taking Vitamin B Complex - Tablet 1 tablet Orally once daily  Taking Vitamin D 25 MCG (1000 UT) Tablet 1 tablet Orally Once a  day  Taking Voltaren 1 % Gel 2 grams Transdermal twice daily  Medication List reviewed and reconciled with the patient  .  PAST MEDICAL HISTORY  .  Scleroderma - CREST dx 2007  IgG4 deficiency s/p IVIG 2010 ----single infusion given  preventively after week of bilateral knee replacements at [facility]  2010  Fx left wrist in 1994  Blood clot at LUE in 2006 on a short course of Coumadin  Bilateral carpal tunnel syndrome s/p Lt carpal tunnel release and  steroids injection right--  Bone spur left foot  Scoliosis  Spinal stenosis s/p steroids injection  s/p bilateral knee replacements for valgus /arthritic  complications, performed by Dr. Katrinka Blazing 2010--- never infected, but  packed with antibiotics with surgery;  Right rotator cuff repairs x 4, complicated by repeat tears,  infection, placement of anchor material  Elevated liver function tests  Diabetes  Seronegative RA  .  ALLERGIES  .  N.K.D.A.  .  SURGICAL HISTORY  .  Right rotator cuff repair, with infected hardware that had to be  removed 2006  Repeat right shoulder surgery, also which became infected. 2007  Left rotator cuff repair 2008  bilateral knee replacements 2009  Left arthroscopic carpal tunnel release 01/2011  ORIF Left 4th metatarsal bone  Left Ulna shortening 1994  Knee replacement 2010  Hand  surgery 2013, 2015  remove gallbladder/hernia 1990  Plate left foot 3rd metatarsal 2008  .  FAMILY HISTORY  .  Mother: deceased 58 yrs, lung cancer, hyperthyroidism, diagnosed  with Other malignant neoplasm of unspecified site  Father: deceased 17s yrs, heart attack, Unspecified heart disease  3 brother(s) .  FH of arthritis and scleroderma\nFather deceased from MI\nMother  deceased age 39 with lung cancer \\nBrother  with scleroderma \/  copd.  .  SOCIAL HISTORY  .  .  Tobacco  history: Never smoked.  .  Marital Status Single.  .  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  .  Alcohol Former daily EtOH use in 20s.  .  Nonsmoker.Lives with longstanding boyfriend.  [name]  HOSPITALIZATION/MAJOR DIAGNOSTIC PROCEDURE  .  as above  .  REVIEW OF SYSTEMS  .  ORT:  .  Eyes    No . Ear, Nose Throat    No . Digestion, Stomach, Bowel     No . Bladder Problems    No . Bleeding Problems    No .  Numbness/Tingling    No . Anxiety/Depression    No .  Fever/Chills/Fatigue    No . Chest Pain/Tightness/Palpitations     No . Skin Rash    No . Dental Problems    No . Joint/Muscle  Pain/Cramps    Yes . Blackout/Fainting    No . Other    No .  .  VITAL SIGNS  .  Pain scale 2, Ht-in 62, Wt-lbs 147, BMI 26.88, BSA 1.71, Ht-cm  157.48, Wt-kg 66.68, Wt Change -5 lb.  [name]  EXAMINATION  .  General Examination: 83F in NADRLE: TTP greater  troch, skin intactFull hip, knee, ankle ROM,  painlessNeurovascularly intact distally LLE: Significant  ecchymosis about the posterior thigh down to the kneeNo retracted  muscle belly palpableWith hamstring firing, appears to be  continuous muscle bulk from knee to ischial tuberosityTTP ischial  tuberosity, central mid posterior thigh4/5 hamstring strength  compared to contralateral side 5/5 Full hip and knee ROM, 0-120  and 0-130 respectively, painless Fires EHL/FHL/TA/GSCSILT  s/s/sp/dp/t nerve distribution, denies numbness/tinglingWeakly  palpable DP, strongly palpable PT Toes cool but patient states  this  is chronic from sclerodermaRadiographs of the left hip and  femur reveal no acute fracture. Bony avulsion about the ischial  tuberosity although this fragment is well corticated and appears  chronic.  .  ASSESSMENTS  .  Tear of left hamstring, initial encounter - J88.325Q (Primary)  .  Greater trochanteric bursitis of right hip - M70.61  .  83F with history and exam consistent with high grade partial  tearing of the left hamstring with concern for proximal tendon  rupture. Given weakness, mechanism of injury and significant  posterior thigh ecchymosis, would recommend  MRI left proximal  hamstring to evaluate the hamstring tendons and origin. If this  represents a proximal avulsion rupture, this may be amenable to  surgical repair and MRI would guide [place] down this treatment  pathway. Follow-up after MRI. Patient also with exacerbation of  right greater troch bursitis and injection was provided at  today's visit. Patient was seen and evaluated with Dr. Leandro Reasoner  and all questions were answered.  [name]  PROCEDURES  .  The correct patient and side were identified.  Verbal consent obtained. Under sterile condition, the RIGHT  greater trochanteric bursa was injected with 2cc betamethasone  and 8cc 1% lidocaine plain. Patient tolerated the procedure well  with no complications. Band aid applied.  [name]  PROCEDURE CODES  .  20610 Aspiration/Injection shoulder, hip, knee, subacromial  bursa, Modifiers: 25  .  J0702 Celestone Soluspan 2 ml  .  FOLLOW UP  .  after MRI  .  [name]  Appointment Provider: Alden Benjamin, MD  .  Electronically signed by Chauncey Cruel , MD on  03/25/2019 at 08:32 AM EST  .  CONFIRMATORY SIGN OFF  .  [name]  Document electronically signed by Charlie Pitter

## 2019-03-21 NOTE — Progress Notes (Signed)
 [name], [name] **DOB:** April 18, 1960 (58 yo F) **Acc No.** [id number] **DOS:**  03/21/2019    ---       [name], [name]**    ------    24 Y old Female, DOB: [birth year], External MRN: [id number]    Account Number: [id number]    [address], [place], [place]    Home: [phone]    Guarantor: [name] Insurance: [id number] NHP PPO    PCP: Ellin Goodie, MD Referring: Ellin Goodie, MD External Visit ID:  [id number]    [facility]        * * *    03/21/2019  **Appointment Provider:** Alden Benjamin, MD **CHN#:** [id number]    ------     **Supervising Provider:** Rhoderick Moody    ---       **Current Medications**    ---    Taking    * Baclofen 10 MG Tablet 1/2 tab Orally prn, Notes: prn    ---    * Bactrim 400-80 MG Tablet 1 tablet Orally Once a day    ---    * Calcium Carbonate-Vitamin D 600-200 MG-UNIT Capsule 1 capsule with a meal Orally Twice a day    ---    * Famotidine 40 MG Tablet 1 tablet at bedtime Orally Once a day, Notes: prn    ---    * Gammagard - Solution as directed Injection once every 4 weeks, Notes: every 4 weeks    ---    * GaviLyte-C 240 GM Solution Reconstituted (If unavailable, may dispense Gavilyte-Nper MD) 4000 ml Orally Please follow [place] instructions    ---    * GlipiZIDE XL 5 MG Tablet Extended Release 24 Hour 1 tablet Orally Once a day    ---    * Glucophage XR 500 mg Tablet Extended Release 24 Hour 4 tablets Orally Once a day    ---    * Hydroxychloroquine Sulfate 200 MG Tablet 1 tablet with food or milk Orally Once a day    ---    * Jardiance 10 MG Tablet 1 tablet Orally Once a day    ---    * Lidocaine 5 % Patch 1 patch remove after 12 hours Externally Once a day    ---    * Lisinopril 2.5 MG Tablet 1 tablet Orally Once a day    ---    * Omeprazole 40 mg Capsule Delayed Release 1 capsule Orally Once a day    ---    * PredniSONE 10 MG Tablet 3 tablet Orally Once a day    ---    * Rituxan 500 MG/50ML Solution as directed Intravenous  , Notes: Last dose 11/29/18    ---    * Rosuvastatin Calcium 10 mg Tablet 1 tablet Orally Once a day    ---    * Tramadol HCl 50 MG Tablet 1 tablet Orally every 6 hrs, Notes: PRN - rarely takes    ---    * Vitamin B Complex - Tablet 1 tablet Orally once daily    ---    * Vitamin D 25 MCG (1000 UT) Tablet 1 tablet Orally Once a day    ---    * Voltaren 1 % Gel 2 grams Transdermal twice daily    ---    * Medication List reviewed and reconciled with the patient    ---     Past Medical History    ---  Scleroderma - CREST dx 2007.        ---    IgG4 deficiency s/p IVIG 2010 ----single infusion given preventively after  week of bilateral knee replacements at [facility] 2010.        ---    Fx left wrist in 1994.        ---    Blood clot at LUE in 2006 on a short course of Coumadin.        ---    Bilateral carpal tunnel syndrome s/p Lt carpal tunnel release and steroids  injection right--.        ---    Bone spur left foot.        ---    Scoliosis.        ---    Spinal stenosis s/p steroids injection.        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 Katrinka Blazing 2010--- never infected, but packed with antibiotics with  surgery;.        ---    Right rotator cuff repairs x 4, complicated by repeat tears, infection,  placement of anchor material.        ---    Elevated liver function tests.        ---    Diabetes.        ---    Seronegative RA.        ---      **Surgical History**    ---      Right rotator cuff repair, with infected hardware that had to be removed  2006    ---    Repeat right shoulder surgery, also which became infected. 2007    ---    Left rotator cuff repair 2008    ---    bilateral knee replacements 2009    ---    Left arthroscopic carpal tunnel release 01/2011    ---    ORIF Left 4th metatarsal bone    ---    Left Ulna shortening 1994    ---    Knee replacement 2010    ---    Hand surgery 2013, 2015    ---    remove gallbladder/hernia 1990    ---    Plate left  foot 3rd metatarsal 2008    ---      **Family History**    ---      Mother: deceased 80 yrs, lung cancer, hyperthyroidism, diagnosed with Other  malignant neoplasm of unspecified site    ---    Father: deceased 89s yrs, heart attack, Unspecified heart disease    ---    3 brother(s) .    ---    FH of arthritis and scleroderma\nFather deceased from MI\nMother deceased age  31 with lung cancer \\nBrother  with scleroderma \/ copd.    ---      **Social History**    ---    Tobacco history: Never smoked.    Marital Status  Single.    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    Alcohol  Former daily EtOH use in 20s.  Nonsmoker.    Lives with longstanding boyfriend.    ---      **Allergies**    ---      N.K.D.A.    ---    [name]]      **Hospitalization/Major Diagnostic Procedure**    ---      as above    ---     **  Review of Systems**    ---     _ORT_ :    Eyes No. Ear, Nose Throat No. Digestion, Stomach, Bowel No. Bladder Problems  No. Bleeding Problems No. Numbness/Tingling No. Anxiety/Depression No.  Fever/Chills/Fatigue No. Chest Pain/Tightness/Palpitations No. Skin Rash No.  Dental Problems No. Joint/Muscle Pain/Cramps Yes. Blackout/Fainting No. Other  No.          **Reason for Appointment**    ---      1\. L. HAMSTRING    ---      **History of Present Illness**    ---     _GENERAL_ :    65F with PMH significant for scleroderma, on chronic prednisone which was  recently increased, who presents for evaluation of ? left hamstring rupture  following an injury on 03/15/19. She reports she squatted down to pick  something up and heard a pop in her proximal posterior thigh. Pain improves  with heat and no activity. Pain worsens with any movement and walking. She  states her gait has been abnormal since the injury where she has to lift her  leg up higher prior to placing it down. She endorses significant ecchymosis  and swelling about the posterior thigh. Denies prior injury in this area  but  has previously ruptured her right Achilles tendon.      **Vital Signs**    ---    Pain scale 2, Ht-in 62, Wt-lbs 147, BMI 26.88, BSA 1.71, Ht-cm 157.48, Wt-kg  66.68, Wt Change -5 lb.      **Examination**    ---     _General Examination:_    65F in NAD    RLE:    TTP greater troch, skin intact    Full hip, knee, ankle ROM, painless    Neurovascularly intact distally    LLE:    Significant ecchymosis about the posterior thigh down to the knee    No retracted muscle belly palpable    With hamstring firing, appears to be continuous muscle bulk from knee to  ischial tuberosity    TTP ischial tuberosity, central mid posterior thigh    4/5 hamstring strength compared to contralateral side 5/5    Full hip and knee ROM, 0-120 and 0-130 respectively, painless    Fires EHL/FHL/TA/GSC    SILT s/s/sp/dp/t nerve distribution, denies numbness/tingling    Weakly palpable DP, strongly palpable PT    Toes cool but patient states this is chronic from scleroderma    Radiographs of the left hip and femur reveal no acute fracture. Bony avulsion  about the ischial tuberosity although this fragment is well corticated and  appears chronic.          **Assessments**    ---    1\. Tear of left hamstring, initial encounter - Z61.096E (Primary)    ---    2\. Greater trochanteric bursitis of right hip - M70.61    ---     65F with history and exam consistent with high grade partial tearing of the  left hamstring with concern for proximal tendon rupture. Given weakness,  mechanism of injury and significant posterior thigh ecchymosis, would  recommend MRI left proximal hamstring to evaluate the hamstring tendons and  origin. If this represents a proximal avulsion rupture, this may be amenable  to surgical repair and MRI would guide [place] down this treatment pathway. Follow-  up after MRI. Patient also with exacerbation of right greater troch bursitis  and injection was provided at today's visit. Patient was seen and  evaluated  with Dr.  Leandro Reasoner and all questions were answered.    ---      **Procedures**    ---    The correct patient and side were identified. Verbal consent obtained. Under  sterile condition, the RIGHT greater trochanteric bursa was injected with 2cc  betamethasone and 8cc 1% lidocaine plain. Patient tolerated the procedure well  with no complications. Band aid applied.      **Procedure Codes**    ---      20610 Aspiration/Injection shoulder, hip, knee, subacromial bursa,  Modifiers: 25    ---    K4401 Celestone Soluspan 2 ml    ---      **Follow Up**    ---    after MRI    **Appointment Provider:** Alden Benjamin, MD    Electronically signed by Chauncey Cruel , MD on 03/25/2019 at 08:32 AM EST    Sign off status: Completed        * * *        [facility]    [address] [place], [address]    [place], [place] [address]    Tel: [phone]    Fax: [phone]              * * *          Progress Note: Alden Benjamin, MD 03/21/2019    ---    Note generated by eClinicalWorks EMR/PM Software ([email or web address])

## 2019-03-23 ENCOUNTER — Ambulatory Visit

## 2019-03-23 NOTE — Progress Notes (Signed)
* * *      SANI, LOISEAU [name] **DOB:** [birth year] (58 yo F) **Acc No.** [id number] **DOS:**  03/23/2019    ---       [name], Kaydence [name]**    ------    52 Y old Female, DOB: 30-May-1960    [address], [place], [place] [address]    Home: [phone]    Provider: Zola Runion        * * *    Telephone Encounter    ---    Answered by  Thom Chimes Date: 03/23/2019       Time: 10:48 AM    Reason  *Clinical Advice After Hours re:    ------            Message                     Patient called around 945am on 03/23/19. Patient reports she was seen by Dr. Leandro Reasoner in clinic on Friday 03/21/19 and was diagnosed with a hamstring rupture. She reports that her pain is worsening and she is wondering if she can have a prescription for a narcotic called into her pharmacy. She denies increased swelling, denies numbness/tingling. Reports pain is localized to the posterior aspect of the proximal to mid thigh. She is taking ibuprofin for pain. I informed her that we prefer not to refill narcotic prescriptions on the weekend without evaluating the patient. I encouraged her to alternate between tylenol and ibuprofin every three hours and use ice for pain relief. She was in agreement with the plan. If her pain persists and she needs stronger medication, I encouraged her to call her PCP or go to the nearest urgent care/ED for further evaluation. Patient in agreement with the plan and all questions were answered.                     * * *                ---          * * *         Provider: Christy Ehrsam 03/23/2019    ---    Note generated by eClinicalWorks EMR/PM Software ([email or web address])

## 2019-03-24 ENCOUNTER — Ambulatory Visit: Admitting: Rheumatology

## 2019-03-24 NOTE — Progress Notes (Signed)
* * *      [name], [name] **DOB:** [birth year] (58 yo F) **Acc No.** [id number] **DOS:**  03/24/2019    ---       [name], [name]**    ------    [name], DOB: December 21, 1960    [address], [place], [place] [address]    Home: [phone]    Provider: [name]        * * *    Telephone Encounter    ---    Answered by  Arva Chafe Date: 03/24/2019       Time: 08:26 AM    Reason  call back medication    ------            Message                     wANTED A CALL BACK IN REGARDS TO medication (Prednisone could be breaking down the muscle mass according to Dr. Angelica Ran). MRI this coming Wednesday. Call back above.                Action Taken                     [facility]  03/24/2019 12:02:34 PM > Pt saw ortho and has left hamstring tear.  MRI scheduled for Wednesday.  We will touch base next week after MTI has resulted.  Will talk to [name] as pt wishes to decrease prednisone for concern of future muscle/tendon tears.                    * * *                ---          * * *         Provider: [name] 03/24/2019    ---    Note generated by eClinicalWorks EMR/PM Software ([email or web address])

## 2019-03-26 ENCOUNTER — Ambulatory Visit: Admitting: Sports Medicine

## 2019-03-26 NOTE — Progress Notes (Signed)
* * *      [name], [name] **DOB:** 05-24-1960 (58 yo F) **Acc No.** [id number] **DOS:**  03/26/2019    ---       [name], [name]**    ------    [name], DOB: [birth year]    [address], [place], [place] [address]    Home: [phone]    Provider: Rhoderick Moody        * * *    Telephone Encounter    ---    Answered by  Gypsy Lore Date: 03/26/2019       Time: 09:48 AM    Caller  Patient    ------            Reason  Pain Leg            Message                     Good morning,                   Patient is having very excruciating pain in her hamstring. She would like a pain medication to relieve it. She has not been prescribed anything yet. She can be reached at [phone]. Thanks                 Action Taken                     [name],[name]  03/26/2019 9:50:57 AM >      LIPARI,JESSICA  03/26/2019 10:07:47 AM > Called patient and discussed symptoms with her briefly. The patient said someone was at her house and that she would call back in a few minutes.      LIPARI,JESSICA  03/26/2019 10:17:26 AM > Called and spoke with the patient again. She states she is having increasing pain in her hamstring and calf. She continues to have significant bruising in the posterior thigh radiating slightly into the calf. Denies fever, chills, SOB, CP. She is not currently taking any anti-inflammatory medications. She recent started taking Gabapentin 300 mg BID and does not feel it is helping at all. I recommended she start taking Ibuprofen given she has taken it in the past without any side effects from what she remembers. I recommended she start with 400mg  TID at first to avoid GI upset. She may increase to Ibuprofen 600 mg TID if needed. I recommended she first call her PCP to discuss her medication regimen given they were the ones who prescribed her Gabapentin. She may call us back with an update or any other questions/concerns. All questions answered.                    * * *                ---           * * *         Provider: Rhoderick Moody 03/26/2019    ---    Note generated by eClinicalWorks EMR/PM Software ([email or web address])

## 2019-03-31 ENCOUNTER — Ambulatory Visit

## 2019-04-01 ENCOUNTER — Ambulatory Visit

## 2019-04-01 ENCOUNTER — Ambulatory Visit: Admitting: Sports Medicine

## 2019-04-01 ENCOUNTER — Ambulatory Visit: Admitting: Physician Assistant

## 2019-04-01 ENCOUNTER — Ambulatory Visit: Admitting: Rheumatology

## 2019-04-01 ENCOUNTER — Ambulatory Visit: Admit: 2019-04-01

## 2019-04-01 NOTE — Progress Notes (Signed)
* * *      [name], [name] **DOB:** 1960-11-28 (58 yo F) **Acc No.** [id number] **DOS:**  04/01/2019    ---       [name], [name]**    ------    [name], DOB: [birth year]    [address], [place], [place] [address]    Home: [phone]    Provider: Rhoderick Moody        * * *    Telephone Encounter    ---    Answered by  Magdalene Patricia Date: 04/01/2019       Time: 12:55 PM    Carin Primrose, PA Rheumatology    ------            Reason  Request Call            Message                     Good Afternoon,             [name] called and requested a call from Dr. Leandro Reasoner PA. She stated that the patient has an upcoming surgery on Monday and would like to discuss the patient medication before surgery. [name] can be reached at, [phone] and is OK to LVM.             Thank you,                 Action Taken                     [facility]  04/01/2019 1:28:57 PM > Spoke with Rose Phi, PA from rheumatology and Dr. Leandro Reasoner. We advised the patient to stay on her daily dose of steroids as she has been on chronic steroids secondary to her scleroderma. In regards to her infusion regiment, we will assess her wound 2 weeks post operatively and if she is healing well then it will be okay for her to have the infusion 4 weeks after that (6 weeks post op).                     * * *                ---          * * *         Provider: Rhoderick Moody 04/01/2019    ---    Note generated by eClinicalWorks EMR/PM Software ([email or web address])

## 2019-04-01 NOTE — Progress Notes (Signed)
 .  Progress Notes  .  Patient: [name] [name]  Provider: Osa Craver  DOB:May 06, 1960 Age: 58 Y Sex: Female  Supervising Provider:: Rhoderick Moody  Date: 04/01/2019  .  PCP: Ellin Goodie  MD  Date: 04/01/2019  .  --------------------------------------------------------------------------------  .  REASON FOR APPOINTMENT  .  1. Left hamstring tear  .  2. Right trochanteric bursitis  .  HISTORY OF PRESENT ILLNESS  .  COVID SCREEN:   58 year old female returns to follow up on left leg MRI to  evaluate for hamstring tear after spontaneous rupture DOI  03/15/19. Patient's PMH includes scleroderma managed with chronic  prednisone and rituxan infusions. Her pain is unchanged,  localized to posterior left thigh with pain radiating down the  leg like "sciatica". Bruising has resolved at upper leg but now  into lower leg. Denies numbness or tingling. She is taking  gabapentin 300mg  at bedtime as prescribed by her PCP. Her right  hip trochanteric bursitis improved for about a week with the  injection.  .  CURRENT MEDICATIONS  .  Taking Baclofen 10 MG Tablet 1/2 tab Orally prn, Notes: prn  Taking Bactrim 400-80 MG Tablet 1 tablet Orally Once a day  Taking Calcium Carbonate-Vitamin D 600-200 MG-UNIT Capsule 1  capsule with a meal Orally Twice a day  Taking Famotidine 40 MG Tablet 1 tablet at bedtime Orally Once a  day, Notes: prn  Taking Gammagard - Solution as directed Injection once every 4  weeks, Notes: every 4 weeks  Taking GaviLyte-C 240 GM Solution Reconstituted (If unavailable,  may dispense Gavilyte-Nper MD) 4000 ml Orally Please follow [place]  instructions  Taking GlipiZIDE XL 5 MG Tablet Extended Release 24 Hour 1 tablet  Orally Once a day  Taking Glucophage XR 500 mg Tablet Extended Release 24 Hour 4  tablets Orally Once a day  Taking Hydroxychloroquine Sulfate 200 MG Tablet 1 tablet with  food or milk Orally Once a day  Taking Jardiance 10 MG Tablet 1 tablet Orally Once a day  Taking Lidocaine 5 %  Patch 1 patch remove after 12 hours  Externally Once a day  Taking Lisinopril 2.5 MG Tablet 1 tablet Orally Once a day  Taking Omeprazole 40 mg Capsule Delayed Release 1 capsule Orally  Once a day  Taking PredniSONE 10 MG Tablet 3 tablet Orally Once a day  Taking Rituxan 500 MG/50ML Solution as directed Intravenous ,  Notes: Last dose 11/29/18  Taking Rosuvastatin Calcium 10 mg Tablet 1 tablet Orally Once a  day  Taking Tramadol HCl 50 MG Tablet 1 tablet Orally every 6 hrs,  Notes: PRN - rarely takes  Taking Vitamin B Complex - Tablet 1 tablet Orally once daily  Taking Vitamin D 25 MCG (1000 UT) Tablet 1 tablet Orally Once a  day  Taking Voltaren 1 % Gel 2 grams Transdermal twice daily  Medication List reviewed and reconciled with the patient  .  PAST MEDICAL HISTORY  .  Scleroderma - CREST dx 2007  IgG4 deficiency s/p IVIG 2010 ----single infusion given  preventively after week of bilateral knee replacements at [facility]  2010  Fx left wrist in 1994  Blood clot at LUE in 2006 on a short course of Coumadin  Bilateral carpal tunnel syndrome s/p Lt carpal tunnel release and  steroids injection right--  Bone spur left foot  Scoliosis  Spinal stenosis s/p steroids injection  s/p bilateral knee replacements for valgus /arthritic  complications, performed  by Dr. Katrinka Blazing 2010--- never infected, but  packed with antibiotics with surgery;  Right rotator cuff repairs x 4, complicated by repeat tears,  infection, placement of anchor material  Elevated liver function tests  Diabetes  Seronegative RA  .  ALLERGIES  .  N.K.D.A.  .  SURGICAL HISTORY  .  Right rotator cuff repair, with infected hardware that had to be  removed 2006  Repeat right shoulder surgery, also which became infected. 2007  Left rotator cuff repair 2008  bilateral knee replacements 2009  Left arthroscopic carpal tunnel release 01/2011  ORIF Left 4th metatarsal bone  Left Ulna shortening 1994  Knee replacement 2010  Hand surgery 2013, 2015  remove gallbladder/hernia  1990  Plate left foot 3rd metatarsal 2008  .  FAMILY HISTORY  .  Mother: deceased 62 yrs, lung cancer, hyperthyroidism, diagnosed  with Other malignant neoplasm of unspecified site  Father: deceased 63s yrs, heart attack, Unspecified heart disease  3 brother(s) .  FH of arthritis and scleroderma\nFather deceased from MI\nMother  deceased age 48 with lung cancer \\nBrother  with scleroderma \/  copd.  .  SOCIAL HISTORY  .  .  Tobacco  history: Never smoked.  .  Marital Status Single.  .  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  .  Alcohol Former daily EtOH use in 20s.  .  Nonsmoker.Lives with longstanding boyfriend.  [name]  HOSPITALIZATION/MAJOR DIAGNOSTIC PROCEDURE  .  as above  .  REVIEW OF SYSTEMS  .  ORT:  .  Eyes    No . Ear, Nose Throat    No . Digestion, Stomach, Bowel     No . Bladder Problems    No . Bleeding Problems    No .  Numbness/Tingling    No . Anxiety/Depression    No .  Fever/Chills/Fatigue    No . Chest Pain/Tightness/Palpitations     No . Skin Rash    No . Dental Problems    No . Joint/Muscle  Pain/Cramps    Yes . Blackout/Fainting    No . Other    No .  .  VITAL SIGNS  .  Pain scale 8, Ht-in 62, Ht-cm 157.48.  [name]  EXAMINATION  .  General Examination: Well appearing female in no  acute distress.Examination or right lower extremity:TTP greater  troch, skin clean and intact. No erythema or warmth.Full hip,  knee, ankle ROM, painlessNeurovascularly intact distally  Examination of the left lower extremity: Moderate ecchymosis  about the posterior knee and into the calf. No retracted muscle  belly palpableWith hamstring firing, appears to be continuous  muscle bulk from knee to ischial tuberosityTTP ischial  tuberosity, central mid posterior thigh4/5 hamstring strength  compared to contralateral side 5/5 Full hip and knee ROM, 0-120  and 0-130 respectively, painless Fires EHL/FHL/TA/GSCSILT  s/s/sp/dp/t nerve distribution, denies numbness/tinglingWeakly  palpable DP, strongly palpable PT Toes cool  but patient states  this is chronic from scleroderma [facility] MRI of left leg from  12/26 reviewed today. Evidence of complete hamstrings tear - all  3 tendons with > 2cm retraction.  .  ASSESSMENTS  .  Tear of left hamstring, subsequent encounter - S76.312D (Primary)  .  TREATMENT  .  Tear of left hamstring, subsequent encounter  Notes: Patient seen and evaluated with Dr. Leandro Reasoner. MRI confirmed  our suspicision for left hamstrings tear. Discussed possible  treatment options inlcuding operative versus nonoperative.  Patient voiced interest in surgical intervention. We discusssed  this procedure in detail including the  recovery to follow.  Discussed risks which include but are not limited to stiffness,  bleeding, blood clots, damage to surrounding structures, retear,  need for further surgeries, infection, wound problems, pain with  sitting. We will schedule surgery for Monday. Answered all  questions and patient agrees with plan.  .  We will evaluate her wound post operatively. If signs of poor  wound healing we will advise to delay her rituixan infusion.  .  FOLLOW UP  .  surgery  .  [name]  Appointment Provider: Osa Craver, PA  .  Electronically signed by Chauncey Cruel , MD on  04/07/2019 at 02:05 PM EST  .  CONFIRMATORY SIGN OFF  SALZLER,MATTHEW J 04/07/2019 2:05:10 PM > I personally interviewed and examined the patient and both the PA and I contributed to this electronic note. I agree with the history, exam, assessment and plan as detailed in this note and edited it as necessary.   .  Document electronically signed by WHITE, COURTNEY    .

## 2019-04-01 NOTE — Progress Notes (Signed)
* * *      [name], [name] **DOB:** Aug 07, 1960 (58 yo F) **Acc No.** [id number] **DOS:**  04/01/2019    ---       [name], [name]**    ------    [name], DOB: [birth year]    [address] [place], [place] [address]    Home: [phone]    Provider: Rhoderick Moody        * * *    Telephone Encounter    ---    Answered by  [name] Date: 04/01/2019       Time: 04:49 PM    Reason  Pre-op. & surgery arrival time    ------            Message                     Patient requires a pre-op. phone screen prior to her surgery on 04/07/19                Action Taken                     Lorkiewicz,Lauren  04/02/2019 8:48:24 AM >  Called pre-op. & scheduled phone screen to tomorrow at 9:45 am.  Called & spoke with patient.  Explained the time of pre-op. phone screen & 5:30 am arrival time for surgery on 04/07/19      Lorkiewicz,Lauren  04/02/2019 9:02:13 AM > Patient would like to know if she should be using a toilet riser & shower seat post-op.  She would like a call from a clinician at [phone]      [facility]  04/02/2019 10:10:46 AM > I called and spoke with the patient. I advised her that she may use a toilet riser and shower seat post-op, both would be helpful for her. I advised her to call back with the fax number. Lauren, I will write the prescriptions and give them to you. Once she calls back do you mind faxing them to the number she provides? Thank you!      Lorkiewicz,Lauren  04/02/2019 11:35:57 AM > Prescriptions scanned into systemOrtiz,[name]  04/02/2019 1:58:39 PM >Good afternoon ,      Patient needs a prescription to be sent over to [facility] for a raised toilet seat and a shower seat before her surgery on Monday. [facility] can be faxed at [phone]. She would also need the Demographics and Medical Nessities for both of these items. She can be reached at [phone] for further questions. Thanks      Lorkiewicz,Lauren  04/02/2019 2:03:52 PM >  Prescriptions, demo. sheet &  most recent visit note faxed to [phone].  Called & informed patient that the Rx has been sent.                    * * *                ---          * * *         Provider: Rhoderick Moody 04/01/2019    ---    Note generated by eClinicalWorks EMR/PM Software ([email or web address])

## 2019-04-01 NOTE — Progress Notes (Signed)
 [name], Brandilynn [name] **DOB:** Jul 04, 1960 (58 yo F) **Acc No.** [id number] **DOS:**  04/01/2019    ---       [name], [name]**    ------    77 Y old Female, DOB: [birth year], External MRN: [id number]    Account Number: [id number]    [address] [place], [place]    Home: [phone]    Guarantor: [name] Insurance: [id number] [employer] PPO    PCP: Ellin Goodie, MD Referring: Ellin Goodie, MD External Visit ID:  [id number]    [facility]        * * *    04/01/2019  **Appointment Provider:** Filbert Craze WHITE, PA **CHN#:** [id number]    ------     **Supervising Provider:** Rhoderick Moody    ---       **Current Medications**    ---    Taking    * Baclofen 10 MG Tablet 1/2 tab Orally prn, Notes: prn    ---    * Bactrim 400-80 MG Tablet 1 tablet Orally Once a day    ---    * Calcium Carbonate-Vitamin D 600-200 MG-UNIT Capsule 1 capsule with a meal Orally Twice a day    ---    * Famotidine 40 MG Tablet 1 tablet at bedtime Orally Once a day, Notes: prn    ---    * Gammagard - Solution as directed Injection once every 4 weeks, Notes: every 4 weeks    ---    * GaviLyte-C 240 GM Solution Reconstituted (If unavailable, may dispense Gavilyte-Nper MD) 4000 ml Orally Please follow [facility] instructions    ---    * GlipiZIDE XL 5 MG Tablet Extended Release 24 Hour 1 tablet Orally Once a day    ---    * Glucophage XR 500 mg Tablet Extended Release 24 Hour 4 tablets Orally Once a day    ---    * Hydroxychloroquine Sulfate 200 MG Tablet 1 tablet with food or milk Orally Once a day    ---    * Jardiance 10 MG Tablet 1 tablet Orally Once a day    ---    * Lidocaine 5 % Patch 1 patch remove after 12 hours Externally Once a day    ---    * Lisinopril 2.5 MG Tablet 1 tablet Orally Once a day    ---    * Omeprazole 40 mg Capsule Delayed Release 1 capsule Orally Once a day    ---    * PredniSONE 10 MG Tablet 3 tablet Orally Once a day    ---    * Rituxan 500 MG/50ML Solution as directed Intravenous ,  Notes: Last dose 11/29/18    ---    * Rosuvastatin Calcium 10 mg Tablet 1 tablet Orally Once a day    ---    * Tramadol HCl 50 MG Tablet 1 tablet Orally every 6 hrs, Notes: PRN - rarely takes    ---    * Vitamin B Complex - Tablet 1 tablet Orally once daily    ---    * Vitamin D 25 MCG (1000 UT) Tablet 1 tablet Orally Once a day    ---    * Voltaren 1 % Gel 2 grams Transdermal twice daily    ---    * Medication List reviewed and reconciled with the patient    ---     Past Medical History    ---  Scleroderma - CREST dx 2007.        ---    IgG4 deficiency s/p IVIG 2010 ----single infusion given preventively after  week of bilateral knee replacements at [facility] 2010.        ---    Fx left wrist in 1994.        ---    Blood clot at LUE in 2006 on a short course of Coumadin.        ---    Bilateral carpal tunnel syndrome s/p Lt carpal tunnel release and steroids  injection right--.        ---    Bone spur left foot.        ---    Scoliosis.        ---    Spinal stenosis s/p steroids injection.        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 Katrinka Blazing 2010--- never infected, but packed with antibiotics with  surgery;.        ---    Right rotator cuff repairs x 4, complicated by repeat tears, infection,  placement of anchor material.        ---    Elevated liver function tests.        ---    Diabetes.        ---    Seronegative RA.        ---      **Surgical History**    ---      Right rotator cuff repair, with infected hardware that had to be removed  2006    ---    Repeat right shoulder surgery, also which became infected. 2007    ---    Left rotator cuff repair 2008    ---    bilateral knee replacements 2009    ---    Left arthroscopic carpal tunnel release 01/2011    ---    ORIF Left 4th metatarsal bone    ---    Left Ulna shortening 1994    ---    Knee replacement 2010    ---    Hand surgery 2013, 2015    ---    remove gallbladder/hernia 1990    ---    Plate left  foot 3rd metatarsal 2008    ---      **Family History**    ---      Mother: deceased 35 yrs, lung cancer, hyperthyroidism, diagnosed with Other  malignant neoplasm of unspecified site    ---    Father: deceased 54s yrs, heart attack, Unspecified heart disease    ---    3 brother(s) .    ---    FH of arthritis and scleroderma\nFather deceased from MI\nMother deceased age  23 with lung cancer \\nBrother  with scleroderma \/ copd.    ---      **Social History**    ---    Tobacco history: Never smoked.    Marital Status  Single.    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    Alcohol  Former daily EtOH use in 20s.  Nonsmoker.    Lives with longstanding boyfriend.    ---      **Allergies**    ---      N.K.D.A.    ---    [name]]      **Hospitalization/Major Diagnostic Procedure**    ---      as above    ---     **  Review of Systems**    ---     _ORT_ :    Eyes No. Ear, Nose Throat No. Digestion, Stomach, Bowel No. Bladder Problems  No. Bleeding Problems No. Numbness/Tingling No. Anxiety/Depression No.  Fever/Chills/Fatigue No. Chest Pain/Tightness/Palpitations No. Skin Rash No.  Dental Problems No. Joint/Muscle Pain/Cramps Yes. Blackout/Fainting No. Other  No.          **Reason for Appointment**    ---      1\. Left hamstring tear    ---    2\. Right trochanteric bursitis    ---      **History of Present Illness**    ---     _COVID SCREEN_ :    58 year old female returns to follow up on left leg MRI to evaluate for  hamstring tear after spontaneous rupture DOI 03/15/19. Patient's PMH includes  scleroderma managed with chronic prednisone and rituxan infusions. Her pain is  unchanged, localized to posterior left thigh with pain radiating down the leg  like "sciatica". Bruising has resolved at upper leg but now into lower leg.  Denies numbness or tingling. She is taking gabapentin 300mg  at bedtime as  prescribed by her PCP. Her right hip trochanteric bursitis improved for about  a week with the  injection.      **Vital Signs**    ---    Pain scale 8, Ht-in 62, Ht-cm 157.48.      **Examination**    ---     _General Examination:_    Well appearing female in no acute distress.    Examination or right lower extremity:    TTP greater troch, skin clean and intact. No erythema or warmth.    Full hip, knee, ankle ROM, painless    Neurovascularly intact distally    Examination of the left lower extremity:    Moderate ecchymosis about the posterior knee and into the calf. No retracted  muscle belly palpable    With hamstring firing, appears to be continuous muscle bulk from knee to  ischial tuberosity    TTP ischial tuberosity, central mid posterior thigh    4/5 hamstring strength compared to contralateral side 5/5    Full hip and knee ROM, 0-120 and 0-130 respectively, painless    Fires EHL/FHL/TA/GSC    SILT s/s/sp/dp/t nerve distribution, denies numbness/tingling    Weakly palpable DP, strongly palpable PT    Toes cool but patient states this is chronic from scleroderma    Shields MRI of left leg from 12/26 reviewed today. Evidence of complete  hamstrings tear - all 3 tendons with > 2cm retraction.          **Assessments**    ---    1\. Tear of left hamstring, subsequent encounter - S76.312D (Primary)    ---      **Treatment**    ---      **1\. Tear of left hamstring, subsequent encounter**    Notes: Patient seen and evaluated with Dr. Leandro Reasoner. MRI confirmed our  suspicision for left hamstrings tear. Discussed possible treatment options  inlcuding operative versus nonoperative. Patient voiced interest in surgical  intervention. We discusssed this procedure in detail including the recovery to  follow. Discussed risks which include but are not limited to stiffness,  bleeding, blood clots, damage to surrounding structures, retear, need for  further surgeries, infection, wound problems, pain with sitting. We will  schedule surgery for Monday. Answered all questions and patient agrees with  plan.        We  will  evaluate her wound post operatively. If signs of poor wound healing we  will advise to delay her rituixan infusion.    ---     **Follow Up**    ---    surgery    **Appointment Provider:** Osa Craver, PA    Electronically signed by Chauncey Cruel , MD on 04/07/2019 at 02:05 PM EST    Sign off status: Completed        * * *        [facility]    [address]    [place], [place] [address]    Tel: [phone]    Fax: [phone]              * * *          Progress Note: Flonnie Wierman WHITE, PA 04/01/2019    ---    Note generated by eClinicalWorks EMR/PM Software ([email or web address])

## 2019-04-02 ENCOUNTER — Ambulatory Visit: Admitting: Sports Medicine

## 2019-04-03 ENCOUNTER — Ambulatory Visit: Admitting: Surgery

## 2019-04-03 ENCOUNTER — Ambulatory Visit

## 2019-04-03 ENCOUNTER — Ambulatory Visit: Admit: 2019-04-03

## 2019-04-03 LAB — HX MICRO-RESP VIRAL PANEL: HX COVID-19 (SARS-COV-2) ADMIT SCREEN: NEGATIVE

## 2019-04-03 NOTE — Progress Notes (Signed)
 [name] [name], [name] [name] **DOB:** [birth year] (58 yo F) **Acc No.** [id number] **DOS:**  04/03/2019    ---      **Progress Notes**    ---    **Patient:** [name] [name], [name] [name]     **Account Number:** [id number] **External MRN:** [id number]  **Provider:**  Appointment Resource     **DOB:** 1960-05-26 **Age:** 58 Y **Sex:** Female  **Date:** 04/03/2019     **Phone:** [phone]     **Address:** [address] [place], [place]     **Pcp:** Ellin Goodie, MD        * * *        **Subjective:**        ---      **Chief Complaints:**    ------      1\. O13.086V DOS 04/07/2019.    ------     **Medical History:**        ------        **Objective:**        ---         **Assessment:**        ---         **Plan:**        ---        ------    ---    ---                ---    Electronically signed by Oralia Manis on 04/03/2019 at 07:03 AM EST    Sign off status: Completed          * * *      **Provider:** Appointment Resource  **Date:** 04/03/2019    ------

## 2019-04-07 ENCOUNTER — Ambulatory Visit: Admitting: Physician Assistant

## 2019-04-07 ENCOUNTER — Ambulatory Visit: Admitting: Rheumatology

## 2019-04-07 ENCOUNTER — Ambulatory Visit: Admitting: Sports Medicine

## 2019-04-07 ENCOUNTER — Ambulatory Visit

## 2019-04-07 ENCOUNTER — Ambulatory Visit: Admit: 2019-04-07

## 2019-04-07 LAB — HX POINT OF CARE
HX GLUCOSE-POCT: 122 mg/dL (ref 70–139)
HX GLUCOSE-POCT: 149 mg/dL — ABNORMAL HIGH (ref 70–139)

## 2019-04-07 MED ORDER — Oxycodone HCl: 5 | tablets | 0 refills | 0 days | Status: AC

## 2019-04-07 MED ORDER — Acetaminophen: 500 | Tablet | 0 refills | 0 days | Status: AC

## 2019-04-07 MED ORDER — PredniSONE: 10 | 180 | Freq: Every day | 0 refills | 0 days | Status: AC

## 2019-04-07 MED ORDER — Ibuprofen: 600 | tablets | Freq: Three times a day (TID) | 0 refills | 0 days | Status: AC

## 2019-04-07 MED ORDER — Lovenox: 30 | syringes | 0 refills | 0 days | Status: AC

## 2019-04-07 NOTE — Op Note (Signed)
 Patient    Telia, Amundson          Med Rec #:  [id number]  Name:  Operation  04/07/2019                Pt.  Dt:                                  Location:  .  [name]                               OPERATIVE REPORT  .  [name]  PREOPERATIVE DIAGNOSIS:  Left proximal hamstring rupture.  [name]  POSTOPERATIVE DIAGNOSIS:  Left proximal hamstring rupture.  [name]  PROCEDURE:  Left proximal hamstring repair.  .  ANESTHESIA:  General endotracheal anesthesia.  .  SURGEON:  Chauncey Cruel, M.D.  .  ASSISTANTS:  1.  Gloris Manchester, M.D.  2.  [place], PA-C.  [name]  ESTIMATED BLOOD LOSS:  10 mL.  .  INTRAVENOUS FLUIDS:  900 mL crystalloid.  [name]  URINE OUTPUT:  None.  .  SPECIMENS:  None.  .  COMPLICATIONS:  None.  .  INDICATIONS FOR PROCEDURE:  Meira is a 59 year old woman with multiple  medical problems including being on chronic prednisone and Rituxan for  significant scleroderma as well as a history of a DVT and PE.  Terree had a  spontaneous rupture of her hamstring on 03/15/19.  She has had prior  surgical repairs of other tendons including Achilles tendons and her  rotator cuff Secily's exam revealed significant weakness as well as sciatic  leg pain radiating down her leg.  Her MRI revealed a complete rupture of  the hamstring with approximately 99% of the hamstring retracted greater  than 2 cm.  Adanna and I had a discussion about the risks and benefits of  operative and nonoperative intervention.  We discussed the differences  between the 2 as well as medical risks associated with surgery.  We  discussed risks of the surgery including but not limited to a high  likelihood of a sensation of sitting on a golf ball, wound or hardware  complications, bleeding, blood clots, infection, medical and anesthetic  complications, allergic reactions, and death.  Terianne expressed a very good  understanding of the risks, benefits and rehab associated with the  procedure.  She has a great medical knowledge and all of her questions were  answered prior to her  procedure.  .  DESCRIPTION OF PROCEDURE:  Tannisha was brought to the preoperative holding  area.  Her left hamstring was marked.  She was then brought to the  operating room where general endotracheal anesthesia was induced with her  on the hospital stretcher.  She was then slowly and carefully brought into  the prone position.  Her shoulders and neck were placed in neutral.  All  bony prominences well padded.  Her body was secured in neutral.  The bed  was bent approximately 30 degrees at the hips and then knees were bent to  approximately 30 degrees with a bump.  The left gluteal region was then  prepped and draped in the usual sterile fashion.  A time out was performed  to identify Jadzia's left proximal hamstring as the correct site and site of  procedure using multiple identifiers including signed site mark and consent  form.  IV Ancef was  given within 1 hour prior to start of procedure and SCD  boot on the well leg for mechanical DVT prophylaxis.  .  A transverse incision in the gluteal crease was marked out.  It was  infiltrated with 10 mL of 0.25% Marcaine with epinephrine.  The incision  was then created with the use of a #15 blade.  Electrocautery was used to  coagulate any bleeders.  Metzenbaum scissors were used to dissect down to  the fascia and then through the fascia.  We then identified the underlying  gluteal fascia and opened this.  We took care to preserve any gluteal  nerves.  We then identified the underlying ischial tuberosity, which was  there.  There were few string like fibers of the tendon sheath attached  inferolaterally on the ischial tuberosity, but the remainder of the ischial  tuberosity was fair.  We were able to identify and palpate the sciatic  nerve and keep this protected without putting any traction on the sciatic  nerve throughout the remainder of the procedure.  We then identified the  entire proximal hamstring tendon, which was retracted approximately 3-4  cm.  .  Attention was  then turned to the repair.  We took a rongeur and freshened  up the edge of the ischial tuberosity.  We then freshened up the edge of  the proximal hamstring with the use of a #15 blade and a rongeur.  Temperance's  ischial tuberosity was quite small and because of the small amount of  tendon remaining posterior inferiorly, decision was made to place 4 anchors  as opposed to usual 5.  We initially attempted to place 3 BioComposite  SutureTak anchors.  We were able to place one of these anchors with good  purchase; however, when we placed the second anchor it pulled out and an  attempt to replace a SutureTak anchor also failed.  Because of that, we cut  the previously placed 3-0 BioComposite SutureTak and then also placed three  4.75-mm BioComposite corkscrew anchors.  All these anchors were doubly  loaded.  We then placed 8 sets of sutures through the proximal hamstring in  a horizontal mattress like fashion such that when tied down, they would  fully cover the ischial tuberosity.  We then began hand tying these under  maximal handheld tension starting inferiorly and progressing superiorly.  Once this was done, we had excellent apposition of the tendon.  The knee  was brought through a full range of motion and the repair remained stable.  The wound was then thoroughly irrigated and drained.  We then injected  additional 20 mL of 0.25% Marcaine with epinephrine surrounding tissues.  We then used 0 Vicryl to repair the gluteal fascia, 3-0 Vicryl to repair  the subcutaneous layer and a 3-0 Monocryl to repair the skin.  The wound  was then covered by Dermabond.  Once this was allowed to dry, we placed  Steri-Strips, Xeroform, 4x4s, and Tegaderm.  Rayen was then placed back  into the hospital stretcher in the supine position with a Bledsoe brace  locked in her knee locked at 40 degrees.  She was transferred to recovery  in stable condition.  She tolerated the procedure well.  [name]  POSTOPERATIVE COURSE:  Janari will follow my  standard postoperative  hamstring repair protocol.  She will be on high dose prophylaxis for DVT  with Lovenox of 30 units twice daily for 2 weeks followed by 40 units once  daily until she is  ambulating.  .  .  .  Electronically Signed  MATTHEW SALZLER 04/23/2019 10:34 A  .  .  .  .  Dictated by: Chauncey Cruel  .  D:    04/16/2019  T:    04/17/2019 04:30 A  Dictation ID:  [id number]/Doc#  [id number]  .  cc:  .  [name]      Document is preliminary until electronically or manually signed by                             attending physician.

## 2019-04-07 NOTE — Progress Notes (Signed)
* * *      TELESHA, DEGUZMAN [name] **DOB:** 08-24-1960 (59 yo F) **Acc No.** [id number] **DOS:**  04/07/2019    ---       [name], Lella [name]**    ------    73 Y old Female, DOB: Mar 28, 1961    [address] [place], [place] [address]    Home: [phone]    Provider: [name]        * * *    Telephone Encounter    ---    Answered by  Denton Meek Date: 04/07/2019       Time: 09:32 AM    Reason  Prednisone refill    ------            Action Taken                     [facility]  04/07/2019 9:43:06 AM > Refill sent to confirmed pharmacy.                  Refills Continue PredniSONE Tablet, 10 MG, Orally, 180, 2-3 tablet, Once a  day, 60 days, Refills=0    ------          * * *                ---          * * *         Provider: [name] 04/07/2019    ---    Note generated by eClinicalWorks EMR/PM Software ([email or web address])

## 2019-04-07 NOTE — Progress Notes (Signed)
* * *      Lannen, Xandra [name] **DOB:** 11/18/1960 (59 yo F) **Acc No.** [id number] **DOS:**  04/07/2019    ---       [name], Keidy [name]**    ------    63 Y old Female, DOB: [birth year]    [address], [place], [place] [address]    Home: [phone]    Provider: WHITE, Gwendolyn Nishi        * * *    Telephone Encounter    ---    Answered by  Osa Craver Date: 04/07/2019       Time: 10:15 AM    Message                     Post op medication        ------            Action Taken                     [facility]  04/07/2019 10:15:49 AM >                 Refills Start Lovenox Solution, 30 MG/0.3ML, Subcutaneous, 28 syringes, 0.3  ml, every 12 hrs, 14 days, Refills=0    ------     Start Acetaminophen Tablet, 500 MG, Orally, 180 Tablet, 2 tablets as needed,  every 8 hrs, 30 days, Refills=0     Start Ibuprofen Tablet, 600 MG, Orally, 90 tablets, 1 tablet with food or  milk as needed, Three times a day, Refills=0     Start Oxycodone HCl Tablet, 5 MG, Orally, 30 tablets, 1 tablet as needed,  every 4-6 hrs, Refills=0          * * *                ---          * * *         Provider: WHITE, Pasqual Farias 04/07/2019    ---    Note generated by eClinicalWorks EMR/PM Software ([email or web address])

## 2019-04-10 ENCOUNTER — Ambulatory Visit: Admitting: Rheumatology

## 2019-04-10 NOTE — Progress Notes (Signed)
* * *      ARLOA, PRAK [name] **DOB:** [birth year] (59 yo F) **Acc No.** [id number] **DOS:**  04/10/2019    ---       [name], Makailee [name]**    ------    25 Y old Female, DOB: Jan 04, 1961    [address], [place], [place] [address]    Home: [phone]    Provider: [name]        * * *    Telephone Encounter    ---    Answered by  Denton Meek Date: 04/10/2019       Time: 03:46 PM    Reason  Ritux infusions    ------            Message                     Pt is calling to see if she can have her rituximab infusions Feb 1st and Feb 15th.  She sold her house and she is moving out on the 17th.        When I spoke with ortho they were on board as long as we are 4 weeks post op for infusion.   I told ortho that we would make sure everyhting was okay at the 2 week post op visit (healing well).                   Action Taken                     [facility]  04/10/2019 3:52:46 PM > Hi Zoe.  Can you look into scheduling on these dates with pt?  Thanks!!!      [name],[name] , [name] 04/10/2019 4:17:28 PM > Hi [name], I believe she got this through [facility] last time. Can you see if they can still service her? THanks!      Lindley Magnus, [name] 04/11/2019 11:03:32 AM > Zoe she was actually seen in [place] in August, does she want home infusion?       [name],[name] , [name] 04/11/2019 11:35:29 AM > Spoke to the patient, she's okay with [facility]. Thanks!      Lindley Magnus, [name] 04/11/2019 11:58:50 AM > Zoe, PA from August still active and patient still with plan yo're okay to go.      [name],[name] , [name] 04/11/2019 12:02:42 PM > Thanks! new order written for [facility]. will have mary coordinate scheduling.                    * * *                ---          * * *         Provider: [name] 04/10/2019    ---    Note generated by eClinicalWorks EMR/PM Software ([email or web address])

## 2019-04-11 ENCOUNTER — Ambulatory Visit: Admitting: Rheumatology

## 2019-04-11 NOTE — Progress Notes (Signed)
* * *      [name], [name] **DOB:** 1961/02/10 (59 yo F) **Acc No.** [id number] **DOS:**  04/11/2019    ---       [name], [name]**    ------    [name], DOB: 09-Oct-1960    [address], [place], [place] [address]    Home: [phone]    Provider: [name]        * * *    Telephone Encounter    ---    Answered by  [name] Date: 04/11/2019       Time: 10:25 AM    Caller  Sara/ [facility]    ------            Reason  Medical updates            Message                     Good Morning,            Sarah from [facility] called on behalf of the patient regarding the blood drawn at the home, they are no longer able to do the blood labs due to a shortage in the lab in the box that they typically use. [phone]. Please put in lab orders for the patient at another facility, patient would like to have it done at [facility] on her next follow up please.             Thank You             Thank You                 Action Taken                     [facility]  04/11/2019 10:29:16 AM >      [name],[name]  04/11/2019 11:20:12 AM > Zoe.  Does this matter?  How close do the labs need to be to the infusion?      [name],[name] , [name] 04/11/2019 11:27:15 AM > They are typically drawn prior to each infusion but none are very time sensitive. Routine labs unless something comes back abnormal.      [name],[name]  04/11/2019 3:51:28 PM > Okay so this is okay for me to address?  We can do labs at her f/u?      [name],[name] , [name] 04/14/2019 3:02:31 PM > actually she's going to get the infusion done in [place] now so she will get CBC w/ diff, BUN, Creatnine, LFTs, and ESR/CRP drawn prior to each infusion. She can get additional labs that arent listed at the f/u if needed. Thnaks!      [name],[name]  04/14/2019 3:21:06 PM > Noted.                    * * *                ---          * * *         Provider: [name] 04/11/2019    ---    Note generated by eClinicalWorks  EMR/PM Software ([email or web address])

## 2019-04-22 ENCOUNTER — Ambulatory Visit: Admitting: Sports Medicine

## 2019-04-22 ENCOUNTER — Ambulatory Visit: Admitting: Surgical

## 2019-04-22 ENCOUNTER — Ambulatory Visit: Admitting: Rheumatology

## 2019-04-22 ENCOUNTER — Ambulatory Visit

## 2019-04-22 ENCOUNTER — Ambulatory Visit: Admit: 2019-04-22

## 2019-04-22 MED ORDER — Lovenox: 40 | 30 | Freq: Every day | 0 refills | 0 days | Status: AC

## 2019-04-22 NOTE — Progress Notes (Signed)
 .  Progress Notes  .  Patient: [name], [name] [facility]  Provider: Jacqlyn Larsen    .  DOB:07-05-1960 Age: 59 Y Sex: Female  Supervising Provider:: Rhoderick Moody  Date: 04/22/2019  .  PCP: Ellin Goodie  MD  Date: 04/22/2019  .  --------------------------------------------------------------------------------  .  REASON FOR APPOINTMENT  .  1. S/p Left proximal hamstring repair (DOS: 04/07/19)  .  HISTORY OF PRESENT ILLNESS  .  General:  The patient is a 59 year-old female who  presents here today s/p Left proximal hamstring repair on  04/07/2019. The patient is now 2 weeks out from the procedure.  She reports mild discomfort at the incision site. She states her  pain has been well managed with Oxycodone 1 tablet at night time  and Tylenol PRN. She has been on Lovenox 30 units BID for DVT  prophylaxis. She has been touch down weight bearing LLE using  crutches. Of note, she states that the incision site "split open"  slightly about 1 weeks ago. She had mild bleeding from the site  when this occurred. She states the bleeding has since subsided.  She has not experienced any drainage since this occurred. She has  been keeping the incision covered with a dry dressing and  changing the dressing daily. Denies fever or chills. Denies  numbness or tingling. She has not started formal Physical therapy  yet.  [name]  CURRENT MEDICATIONS  .  Taking Acetaminophen 500 MG Tablet 2 tablets as needed Orally  every 8 hrs  Taking Baclofen 10 MG Tablet 1/2 tab Orally prn, Notes: prn  Taking Bactrim 400-80 MG Tablet 1 tablet Orally Once a day  Taking Calcium Carbonate-Vitamin D 600-200 MG-UNIT Capsule 1  capsule with a meal Orally Twice a day  Taking Famotidine 40 MG Tablet 1 tablet at bedtime Orally Once a  day, Notes: prn  Taking Gammagard - Solution as directed Injection once every 4  weeks, Notes: every 4 weeks  Taking GaviLyte-C 240 GM Solution Reconstituted (If unavailable,  may dispense Gavilyte-Nper MD) 4000 ml Orally Please follow    instructions  Taking GlipiZIDE XL 5 MG Tablet Extended Release 24 Hour 1 tablet  Orally Once a day  Taking Glucophage XR 500 mg Tablet Extended Release 24 Hour 4  tablets Orally Once a day  Taking Hydroxychloroquine Sulfate 200 MG Tablet 1 tablet with  food or milk Orally Once a day  Taking Ibuprofen 600 MG Tablet 1 tablet with food or milk as  needed Orally Three times a day  Taking Jardiance 10 MG Tablet 1 tablet Orally Once a day  Taking Lidocaine 5 % Patch 1 patch remove after 12 hours  Externally Once a day  Taking Lisinopril 2.5 MG Tablet 1 tablet Orally Once a day  Taking Lovenox 30 MG/0.3ML Solution 0.3 ml Subcutaneous every 12  hrs  Taking Omeprazole 40 mg Capsule Delayed Release 1 capsule Orally  Once a day  Taking Oxycodone HCl 5 MG Tablet 1 tablet as needed Orally every  4-6 hrs  Taking PredniSONE 10 MG Tablet 2-3 tablet Orally Once a day  Taking Rituxan 500 MG/50ML Solution as directed Intravenous ,  Notes: Last dose 11/29/18  Taking Rosuvastatin Calcium 10 mg Tablet 1 tablet Orally Once a  day  Taking Tramadol HCl 50 MG Tablet 1 tablet Orally every 6 hrs,  Notes: PRN - rarely takes  Taking Vitamin B Complex - Tablet 1 tablet Orally once daily  Taking Vitamin D 25 MCG (1000  UT) Tablet 1 tablet Orally Once a  day  Taking Voltaren 1 % Gel 2 grams Transdermal twice daily  Medication List reviewed and reconciled with the patient  .  PAST MEDICAL HISTORY  .  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 Katrinka Blazing 2010--- never infected, but  packed with antibiotics with  surgery;  Right rotator cuff repairs x 4, complicated by repeat tears,  infection, placement of anchor material  Elevated liver function tests  Diabetes  Seronegative RA  .  ALLERGIES  .  N.K.D.A.  .  SURGICAL HISTORY  .  Right rotator cuff repair, with infected hardware that had to be  removed 2006  Repeat right shoulder surgery, also which became infected. 2007  Left rotator cuff repair 2008  bilateral knee replacements 2009  Left arthroscopic carpal tunnel release 01/2011  ORIF Left 4th metatarsal bone  Left Ulna shortening 1994  Knee replacement 2010  Hand surgery 2013, 2015  remove gallbladder/hernia 1990  Plate left foot 3rd metatarsal 2008  .  FAMILY HISTORY  .  Mother: deceased 35 yrs, lung cancer, hyperthyroidism, diagnosed  with Other malignant neoplasm of unspecified site  Father: deceased 65s yrs, heart attack, Unspecified heart disease  3 brother(s) .  FH of arthritis and scleroderma\nFather deceased from MI\nMother  deceased age 81 with lung cancer \\nBrother  with scleroderma \/  copd.  .  SOCIAL HISTORY  .  .  Tobacco  history: Never smoked.  .  .  Marital Status  Single  .  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  .  .  Alcohol  Former daily EtOH use in 20s  .  Nonsmoker.Lives with longstanding boyfriend.  [name]  HOSPITALIZATION/MAJOR DIAGNOSTIC PROCEDURE  .  as above  .  REVIEW OF SYSTEMS  .  ORT:  .  Eyes    No . Ear, Nose Throat    No . Digestion, Stomach, Bowel     No . Bladder Problems    No . Bleeding Problems    No .  Numbness/Tingling    No . Anxiety/Depression    No .  Fever/Chills/Fatigue    No . Chest Pain/Tightness/Palpitations     No . Skin Rash    No . Dental Problems    No . Joint/Muscle  Pain/Cramps    Yes . Blackout/Fainting    No . Other    No .  .  VITAL SIGNS  .  Pain scale 7, Ht-in 62, Ht-cm 157.48.  [name]  PHYSICAL EXAMINATION  .  On examination today, the patient is a well-appearing, pleasant  59 year-old female in no acute distress.On examination of the  Left lower  extremity, the dry dressing was removed. There is a  transverse incision at the gluteal crease with mild surrounding  erythema. There is no drainage, fluctuance or signs of infection.  No significant swelling. Mild tenderness to palpation at the  incision site. Calf is soft and non-tender. Neurologic sensation  intact to light touch in s/s/dpn/spn/t distributions. Lower  extremities are warm and  well perfused.Radiographs of the Left  femur were obtained today. These demonstrate evidence of a total  knee arthroplasty without evidence of complication. There is  evidence of hip joint space narrowing. Stable appearance compared  to prior on 03/21/2019.  .  ASSESSMENTS  .  Tear of left hamstring, subsequent encounter - [id number]  .  TREATMENT  .  Tear of left hamstring, subsequent encounter  Start Lovenox Solution, 40 MG/0.4ML, 0.4 ml, Subcutaneous, Once a  day, 30 day(s), 30, Refills 0  .  .  Others  Notes: The patient was seen and evaluated with Dr. Leandro Reasoner today.  She is a 59 year-old female who presents here today for follow up  s/p Left proximal hamstring repair on 04/06/18. The patient is  now 2 weeks out from the procedure. She is overall doing well.  Her incision has mild surrouding erythema, however there are no  signs of infection. We reassured the patient that her incision  does not appear to be infected. We instructed her to avoid  submerging the incision site for another few weeks. We discussed  signs of infection which include sudden increase in pain, fever,  chills, increased redness and drainge at the incision. We  instructed her to call the office if she develops any of these  symptoms and we will most likely start her on a course of oral  anitbiotics. We will transition her from Lovenox 30 units BID to  Lovenox 40 units once a day for the next 4 weeks. A prescription  was sent to the pharmacy. We instructed her touch down weight  bearing LLE with crutches for the next 4 weeks. We instructed her  to start  formal Physical therapy and a prescription and a copy of  Dr. Deatra Robinson proximal hamstring repair rehab protocol was  provided to her today. We recommend she follow up with Dr.  Leandro Reasoner in 1.5 weeks for a wound check. All quesitons were  answered and patient agrees with the plan.  .  FOLLOW UP  .  Friday 05/02/19  .  [name]  Appointment Provider: Jacqlyn Larsen  .  Electronically signed by Chauncey Cruel , MD on  04/23/2019 at 11:07 AM EST  .  CONFIRMATORY SIGN OFF  .  [name]  Document electronically signed by Jacqlyn Larsen    .

## 2019-04-22 NOTE — Progress Notes (Signed)
* * *      [name], [name] **DOB:** 24-Mar-1961 (59 yo F) **Acc No.** [id number] **DOS:**  04/22/2019    ---       [name], [name]**    ------    [name], DOB: 1960/12/04    [address], [place], [place] [address]    Home: [phone]    Provider: [name]        * * *    Telephone Encounter    ---    Answered by  Denton Meek Date: 04/22/2019       Time: 03:39 PM    Reason  [employer] disability/benefits paperwork    ------            Action Taken                     [facility]  04/22/2019 3:42:08 PM > Pt called and [employer] may need more info for her leave.      [name],[name]  04/22/2019 3:42:41 PM > P#[phone], LKGMW#[id number]      [name],[name]  04/22/2019 4:26:54 PM > Completed and faxed to [phone].                    * * *                ---          * * *         Provider: [name] 04/22/2019    ---    Note generated by eClinicalWorks EMR/PM Software ([email or web address])

## 2019-04-22 NOTE — Progress Notes (Signed)
 * * [name], [name] **DOB:** 1960/05/16 (59 yo F) **Acc No.** [id number] **DOS:**  04/22/2019    ---       [name], [name]**    ------    45 Y old Female, DOB: [birth year], External MRN: [id number]    Account Number: [id number]    [address] [place], [place]    Home: [phone]    Guarantor: [name] Insurance: [id number] NH[employer] PPO    PCP: Ellin Goodie, MD Referring: Ellin Goodie, MD External Visit ID:  [id number]    [facility]        * * *    04/22/2019  **Appointment Provider:** Zharia Conrow **CHN#:** [id number]    ------     **Supervising Provider:** MATTHEW Charlena Cross    ---       **Current Medications**    ---    Taking    * Acetaminophen 500 MG Tablet 2 tablets as needed Orally every 8 hrs    ---    * Baclofen 10 MG Tablet 1/2 tab Orally prn, Notes: prn    ---    * Bactrim 400-80 MG Tablet 1 tablet Orally Once a day    ---    * Calcium Carbonate-Vitamin D 600-200 MG-UNIT Capsule 1 capsule with a meal Orally Twice a day    ---    * Famotidine 40 MG Tablet 1 tablet at bedtime Orally Once a day, Notes: prn    ---    * Gammagard - Solution as directed Injection once every 4 weeks, Notes: every 4 weeks    ---    * GaviLyte-C 240 GM Solution Reconstituted (If unavailable, may dispense Gavilyte-Nper MD) 4000 ml Orally Please follow [place] instructions    ---    * GlipiZIDE XL 5 MG Tablet Extended Release 24 Hour 1 tablet Orally Once a day    ---    * Glucophage XR 500 mg Tablet Extended Release 24 Hour 4 tablets Orally Once a day    ---    * Hydroxychloroquine Sulfate 200 MG Tablet 1 tablet with food or milk Orally Once a day    ---    * Ibuprofen 600 MG Tablet 1 tablet with food or milk as needed Orally Three times a day    ---    * Jardiance 10 MG Tablet 1 tablet Orally Once a day    ---    * Lidocaine 5 % Patch 1 patch remove after 12 hours Externally Once a day    ---    * Lisinopril 2.5 MG Tablet 1 tablet Orally Once a day    ---    * Lovenox 30 MG/0.3ML  Solution 0.3 ml Subcutaneous every 12 hrs    ---    * Omeprazole 40 mg Capsule Delayed Release 1 capsule Orally Once a day    ---    * Oxycodone HCl 5 MG Tablet 1 tablet as needed Orally every 4-6 hrs    ---    * PredniSONE 10 MG Tablet 2-3 tablet Orally Once a day    ---    * Rituxan 500 MG/50ML Solution as directed Intravenous , Notes: Last dose 11/29/18    ---    * Rosuvastatin Calcium 10 mg Tablet 1 tablet Orally Once a day    ---    * Tramadol HCl 50 MG Tablet 1 tablet Orally every 6 hrs, Notes: PRN - rarely takes    ---    *  Vitamin B Complex - Tablet 1 tablet Orally once daily    ---    * Vitamin D 25 MCG (1000 UT) Tablet 1 tablet Orally Once a day    ---    * Voltaren 1 % Gel 2 grams Transdermal twice daily    ---    * Medication List reviewed and reconciled with the patient    ---     Past Medical History    ---      Scleroderma - CREST dx 2007.        ---    IgG4 deficiency s/p IVIG 2010 ----single infusion given preventively after  week of bilateral knee replacements [facility] 2010.        ---    Fx left wrist in 1994.        ---    Blood clot at LUE in 2006 on a short course of Coumadin.        ---    Bilateral carpal tunnel syndrome s/p Lt carpal tunnel release and steroids  injection right--.        ---    Bone spur left foot.        ---    Scoliosis.        ---    Spinal stenosis s/p steroids injection.        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 Katrinka Blazing 2010--- never infected, but packed with antibiotics with  surgery;.        ---    Right rotator cuff repairs x 4, complicated by repeat tears, infection,  placement of anchor material.        ---    Elevated liver function tests.        ---    Diabetes.        ---    Seronegative RA.        ---      **Surgical History**    ---      Right rotator cuff repair, with infected hardware that had to be removed  2006    ---    Repeat right shoulder surgery, also which became infected. 2007    ---    Left  rotator cuff repair 2008    ---    bilateral knee replacements 2009    ---    Left arthroscopic carpal tunnel release 01/2011    ---    ORIF Left 4th metatarsal bone    ---    Left Ulna shortening 1994    ---    Knee replacement 2010    ---    Hand surgery 2013, 2015    ---    remove gallbladder/hernia 1990    ---    Plate left foot 3rd metatarsal 2008    ---      **Family History**    ---      Mother: deceased 65 yrs, lung cancer, hyperthyroidism, diagnosed with Other  malignant neoplasm of unspecified site    ---    Father: deceased 35s yrs, heart attack, Unspecified heart disease    ---    3 brother(s) .    ---    FH of arthritis and scleroderma\nFather deceased from MI\nMother deceased age  34 with lung cancer \\nBrother  with scleroderma \/ copd.    ---      **Social History**    ---    Tobacco history: Never smoked.    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    Alcohol    _Former daily EtOH use in 20s_  Nonsmoker.    Lives with longstanding boyfriend.    ---      **Allergies**    ---      N.K.D.A.    ---    [name]]      **Hospitalization/Major Diagnostic Procedure**    ---      as above    ---     **Review of Systems**    ---     _ORT_ :    Eyes No. Ear, Nose Throat No. Digestion, Stomach, Bowel No. Bladder Problems  No. Bleeding Problems No. Numbness/Tingling No. Anxiety/Depression No.  Fever/Chills/Fatigue No. Chest Pain/Tightness/Palpitations No. Skin Rash No.  Dental Problems No. Joint/Muscle Pain/Cramps Yes. Blackout/Fainting No. Other  No.          **Reason for Appointment**    ---      1\. S/p Left proximal hamstring repair (DOS: 04/07/19)    ---      **History of Present Illness**    ---     _General_ :    The patient is a 59 year-old female who presents here today s/p Left proximal  hamstring repair on 04/07/2019. The patient is now 2 weeks out from the  procedure. She reports mild discomfort at the incision site. She states her  pain has  been well managed with Oxycodone 1 tablet at night time and Tylenol  PRN. She has been on Lovenox 30 units BID for DVT prophylaxis. She has been  touch down weight bearing LLE using crutches. Of note, she states that the  incision site "split open" slightly about 1 weeks ago. She had mild bleeding  from the site when this occurred. She states the bleeding has since subsided.  She has not experienced any drainage since this occurred. She has been keeping  the incision covered with a dry dressing and changing the dressing daily.  Denies fever or chills. Denies numbness or tingling. She has not started  formal Physical therapy yet.      **Vital Signs**    ---    Pain scale 7, Ht-in 62, Ht-cm 157.48.      **Physical Examination**    ---    On examination today, the patient is a well-appearing, pleasant 59 year-old  female in no acute distress.    On examination of the Left lower extremity, the dry dressing was removed.  There is a transverse incision at the gluteal crease with mild surrounding  erythema. There is no drainage, fluctuance or signs of infection. No  significant swelling. Mild tenderness to palpation at the incision site. Calf  is soft and non-tender. Neurologic sensation intact to light touch in  s/s/dpn/spn/t distributions. Lower extremities are warm and well perfused.    Radiographs of the Left femur were obtained today. These demonstrate evidence  of a total knee arthroplasty without evidence of complication. There is  evidence of hip joint space narrowing. Stable appearance compared to prior on  03/21/2019.      **Assessments**    ---    1\. Tear of left hamstring, subsequent encounter - S76.312D    ---      **Treatment**    ---      **1\. Tear of left hamstring, subsequent encounter**    Start Lovenox Solution, 40 MG/0.4ML, 0.4 ml, Subcutaneous, Once a day, 30  day(s), 30, Refills 0    ---         **  2\. Others**    Notes: The patient was seen and evaluated with Dr. Leandro Reasoner today. She is a  59  year-old female who presents here today for follow up s/p Left proximal  hamstring repair on 04/06/18. The patient is now 2 weeks out from the  procedure. She is overall doing well. Her incision has mild surrouding  erythema, however there are no signs of infection. We reassured the patient  that her incision does not appear to be infected. We instructed her to avoid  submerging the incision site for another few weeks. We discussed signs of  infection which include sudden increase in pain, fever, chills, increased  redness and drainge at the incision. We instructed her to call the office if  she develops any of these symptoms and we will most likely start her on a  course of oral anitbiotics. We will transition her from Lovenox 30 units BID  to Lovenox 40 units once a day for the next 4 weeks. A prescription was sent  to the pharmacy. We instructed her touch down weight bearing LLE with crutches  for the next 4 weeks. We instructed her to start formal Physical therapy and a  prescription and a copy of Dr. Deatra Robinson proximal hamstring repair rehab  protocol was provided to her today. We recommend she follow up with Dr.  Leandro Reasoner in 1.5 weeks for a wound check. All quesitons were answered and  patient agrees with the plan.     **Follow Up**    ---    Friday 05/02/19    **Appointment Provider:** Tresha Muzio    Electronically signed by Chauncey Cruel , MD on 04/23/2019 at 11:07 AM EST    Sign off status: Completed        * * *        [facility]    [address] [place], [address]    [place], [place] [address]    Tel: [phone]    Fax: [phone]              * * *          Progress Note: [name] 04/22/2019    ---    Note generated by eClinicalWorks EMR/PM Software ([email or web address])

## 2019-04-24 ENCOUNTER — Ambulatory Visit: Admitting: Physician Assistant

## 2019-04-24 MED ORDER — Omeprazole: 40 | 90 | Freq: Every day | 1 refills | 0 days | Status: AC

## 2019-04-28 ENCOUNTER — Ambulatory Visit

## 2019-04-29 ENCOUNTER — Ambulatory Visit: Admitting: Rheumatology

## 2019-04-29 NOTE — Progress Notes (Signed)
* * *      [name], [name] **DOB:** [birth year] (59 yo F) **Acc No.** [id number] **DOS:**  04/29/2019    ---       [name], [name]**    ------    80 Y old Female, DOB: September 03, 1960    [address], [place], [place] [address]    Home: [phone]    Provider: [name]        * * *    Telephone Encounter    ---    Answered by  Arva Chafe Date: 04/29/2019       Time: 09:37 AM    Reason  fyi no current labs this month    ------            Message                     Pt wanted to inform you that [facility] had run out of [facility] so the patient does not have any current labs at the moment. States that they should have them again come her next infusion around 2/17.                    * * *                ---          * * *         Provider: [name] 04/29/2019    ---    Note generated by eClinicalWorks EMR/PM Software ([email or web address])

## 2019-05-02 ENCOUNTER — Ambulatory Visit

## 2019-05-02 ENCOUNTER — Ambulatory Visit: Admitting: Sports Medicine

## 2019-05-02 ENCOUNTER — Ambulatory Visit: Admit: 2019-05-02

## 2019-05-02 NOTE — Progress Notes (Signed)
 .  Progress Notes  .  Patient: [name], [name] [facility]  Provider: PARK, DAVID    .  DOB: Sep 11, 1960 Age: 59 Y Sex: Female  Supervising Provider:: Rhoderick Moody  Date: 05/02/2019  .  PCP: Ellin Goodie  MD  Date: 05/02/2019  .  --------------------------------------------------------------------------------  .  REASON FOR APPOINTMENT  .  1. s/p left proximal hamstring repair, 2ND P/O, f/u wound  .  HISTORY OF PRESENT ILLNESS  .  General:  The patient is a 59 year-old female who  presents here today s/p Left proximal hamstring repair on  04/07/2019. The patient is now 3.5 weeks out from the procedure.  During our last visit, she noted that the incision site "split  open" about 1 week after her surgery. Today she is here for close  follow up of her wound.She has has noted that the wound has been  healing well. Minimal to no drainage. She has been keeping the  incision covered with a dry dressing and changing the dressing  daily. Denies fever or chills. Denies numbness or tingling. She  has started physical therapy which has caused her leg to be sore  afterwards.  .  CURRENT MEDICATIONS  .  Taking Acetaminophen 500 MG Tablet 2 tablets as needed Orally  every 8 hrs  Taking Baclofen 10 MG Tablet 1/2 tab Orally prn, Notes: prn  Taking Bactrim 400-80 MG Tablet 1 tablet Orally Once a day  Taking Calcium Carbonate-Vitamin D 600-200 MG-UNIT Capsule 1  capsule with a meal Orally Twice a day  Taking Famotidine 40 MG Tablet 1 tablet at bedtime Orally Once a  day, Notes: prn  Taking Gammagard - Solution as directed Injection once every 4  weeks, Notes: every 4 weeks  Taking GaviLyte-C 240 GM Solution Reconstituted (If unavailable,  may dispense Gavilyte-Nper MD) 4000 ml Orally Please follow [place]  instructions  Taking GlipiZIDE XL 5 MG Tablet Extended Release 24 Hour 1 tablet  Orally Once a day  Taking Glucophage XR 500 mg Tablet Extended Release 24 Hour 4  tablets Orally Once a day  Taking Hydroxychloroquine Sulfate 200 MG  Tablet 1 tablet with  food or milk Orally Once a day  Taking Ibuprofen 600 MG Tablet 1 tablet with food or milk as  needed Orally Three times a day  Taking Jardiance 10 MG Tablet 1 tablet Orally Once a day  Taking Lidocaine 5 % Patch 1 patch remove after 12 hours  Externally Once a day  Taking Lisinopril 2.5 MG Tablet 1 tablet Orally Once a day  Taking Lovenox 30 MG/0.3ML Solution 0.3 ml Subcutaneous every 12  hrs  Taking Lovenox 40 MG/0.4ML Solution 0.4 ml Subcutaneous Once a  day  Taking Omeprazole 40 mg Capsule Delayed Release 1 capsule Orally  Once a day  Taking Oxycodone HCl 5 MG Tablet 1 tablet as needed Orally every  4-6 hrs  Taking PredniSONE 10 MG Tablet 2-3 tablet Orally Once a day  Taking Rituxan 500 MG/50ML Solution as directed Intravenous ,  Notes: Last dose 11/29/18  Taking Rosuvastatin Calcium 10 mg Tablet 1 tablet Orally Once a  day  Taking Tramadol HCl 50 MG Tablet 1 tablet Orally every 6 hrs,  Notes: PRN - rarely takes  Taking Vitamin B Complex - Tablet 1 tablet Orally once daily  Taking Vitamin D 25 MCG (1000 UT) Tablet 1 tablet Orally Once a  day  Taking Voltaren 1 % Gel 2 grams Transdermal twice daily  Medication List reviewed and reconciled with  the patient  .  PAST MEDICAL HISTORY  .  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 Katrinka Blazing 2010--- never infected, but  packed with antibiotics with surgery;  Right rotator cuff repairs x 4, complicated by repeat tears,  infection, placement of anchor material  Elevated liver function tests  Diabetes  Seronegative  RA  .  ALLERGIES  .  N.K.D.A.  .  SURGICAL HISTORY  .  Right rotator cuff repair, with infected hardware that had to be  removed 2006  Repeat right shoulder surgery, also which became infected. 2007  Left rotator cuff repair 2008  bilateral knee replacements 2009  Left arthroscopic carpal tunnel release 01/2011  ORIF Left 4th metatarsal bone  Left Ulna shortening 1994  Knee replacement 2010  Hand surgery 2013, 2015  remove gallbladder/hernia 1990  Plate left foot 3rd metatarsal 2008  .  FAMILY HISTORY  .  Mother: deceased 68 yrs, lung cancer, hyperthyroidism, diagnosed  with Other malignant neoplasm of unspecified site  Father: deceased 2s yrs, heart attack, Unspecified heart disease  3 brother(s) .  FH of arthritis and scleroderma\nFather deceased from MI\nMother  deceased age 66 with lung cancer \\nBrother  with scleroderma \/  copd.  .  SOCIAL HISTORY  .  .  Tobacco  history: Never smoked.  .  .  Marital Status  Single  .  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  .  .  Alcohol  Former daily EtOH use in 20s  .  Nonsmoker.Lives with longstanding boyfriend.  [name]  HOSPITALIZATION/MAJOR DIAGNOSTIC PROCEDURE  .  as above  .  REVIEW OF SYSTEMS  .  ORT:  .  Eyes    No . Ear, Nose Throat    No . Digestion, Stomach, Bowel     No . Bladder Problems    No . Bleeding Problems    No .  Numbness/Tingling    No . Anxiety/Depression    No .  Fever/Chills/Fatigue    No . Chest Pain/Tightness/Palpitations     No . Skin Rash    No . Dental Problems    No . Joint/Muscle  Pain/Cramps    Yes . Blackout/Fainting    No . Other    No .  .  VITAL SIGNS  .  Pain scale 5, Ht-in 62, Ht-cm 157.48.  [name]  PHYSICAL EXAMINATION  .  On examination today, the patient is a well-appearing, pleasant  59 year-old female in no acute distress.On examination of the  Left lower extremity, the dry dressing was removed. There is a  transverse incision at the gluteal crease with no surrounding  erythema. There is no drainage, fluctuance or signs of  infection.  No significant swelling.  .  ASSESSMENTS  .  Tear of left hamstring, subsequent encounter - S76.312D (Primary)  .  TREATMENT  .  Tear of left hamstring, subsequent encounter  Notes: The patient was seen and evaluated with Dr. Leandro Reasoner today.  She is a 59 year-old female who presents here today for follow up  s/p  Left proximal hamstring repair on 04/06/18. The patient is  now 3.5 weeks out from the procedure. Today's visit is for wound  check. There are no signs of infection of the wound. She  continues to do well and has started physical therapy. She is on  Lovenox 40 mg daily. She is overall doing well. She will continue  with the proximal hamstring tear rehab protocol. From a wound  standpoint she is clear to receive immunosuppressive therapy from  Rheumatology for other medical issues. We recommend she follow up  with Dr. Leandro Reasoner in 6 weeks for a 9 week follow up. All quesitons  were answered and patient agrees with the plan.  .  FOLLOW UP  .  6 Weeks (Reason: 9 week postop)  .  [name]  Appointment Provider: DAVID PARK  .  Electronically signed by Chauncey Cruel , MD on  05/07/2019 at 04:52 PM EST  .  CONFIRMATORY SIGN OFF  .  [name]  Document electronically signed by PARK, DAVID    .

## 2019-05-02 NOTE — Progress Notes (Signed)
 * * [name], [name] **DOB:** 02/09/1961 (59 yo F) **Acc No.** [id number] **DOS:**  05/02/2019    ---       [name], [name]**    ------    59 Y old Female, DOB: [birth year], External MRN: [id number]    Account Number: [id number]    [address] [place], [place]    Home: [phone]    Guarantor: [name] Insurance: [id number] NHP PPO    PCP: [name] Goodie, MD Referring: [name] Goodie, MD External Visit ID:  [id number]    [facility]        * * *    05/02/2019  **Appointment Provider:** [name] **CHN#:** [id number]    ------     **Supervising Provider:** [name]    ---       **Current Medications**    ---    Taking    * Acetaminophen 500 MG Tablet 2 tablets as needed Orally every 8 hrs    ---    * Baclofen 10 MG Tablet 1/2 tab Orally prn, Notes: prn    ---    * Bactrim 400-80 MG Tablet 1 tablet Orally Once a day    ---    * Calcium Carbonate-Vitamin D 600-200 MG-UNIT Capsule 1 capsule with a meal Orally Twice a day    ---    * Famotidine 40 MG Tablet 1 tablet at bedtime Orally Once a day, Notes: prn    ---    * Gammagard - Solution as directed Injection once every 4 weeks, Notes: every 4 weeks    ---    * GaviLyte-C 240 GM Solution Reconstituted (If unavailable, may dispense Gavilyte-Nper MD) 4000 ml Orally Please follow [place] instructions    ---    * GlipiZIDE XL 5 MG Tablet Extended Release 24 Hour 1 tablet Orally Once a day    ---    * Glucophage XR 500 mg Tablet Extended Release 24 Hour 4 tablets Orally Once a day    ---    * Hydroxychloroquine Sulfate 200 MG Tablet 1 tablet with food or milk Orally Once a day    ---    * Ibuprofen 600 MG Tablet 1 tablet with food or milk as needed Orally Three times a day    ---    * Jardiance 10 MG Tablet 1 tablet Orally Once a day    ---    * Lidocaine 5 % Patch 1 patch remove after 12 hours Externally Once a day    ---    * Lisinopril 2.5 MG Tablet 1 tablet Orally Once a day    ---    * Lovenox 30 MG/0.3ML Solution  0.3 ml Subcutaneous every 12 hrs    ---    * Lovenox 40 MG/0.4ML Solution 0.4 ml Subcutaneous Once a day    ---    * Omeprazole 40 mg Capsule Delayed Release 1 capsule Orally Once a day    ---    * Oxycodone HCl 5 MG Tablet 1 tablet as needed Orally every 4-6 hrs    ---    * PredniSONE 10 MG Tablet 2-3 tablet Orally Once a day    ---    * Rituxan 500 MG/50ML Solution as directed Intravenous , Notes: Last dose 11/29/18    ---    * Rosuvastatin Calcium 10 mg Tablet 1 tablet Orally Once a day    ---    * Tramadol HCl 50 MG Tablet  1 tablet Orally every 6 hrs, Notes: PRN - rarely takes    ---    * Vitamin B Complex - Tablet 1 tablet Orally once daily    ---    * Vitamin D 25 MCG (1000 UT) Tablet 1 tablet Orally Once a day    ---    * Voltaren 1 % Gel 2 grams Transdermal twice daily    ---    * Medication List reviewed and reconciled with the patient    ---     Past Medical History    ---      Scleroderma - CREST dx 2007.        ---    IgG4 deficiency s/p IVIG 2010 ----single infusion given preventively after  week of bilateral knee replacements at [facility] 2010.        ---    Fx left wrist in 1994.        ---    Blood clot at LUE in 2006 on a short course of Coumadin.        ---    Bilateral carpal tunnel syndrome s/p Lt carpal tunnel release and steroids  injection right--.        ---    Bone spur left foot.        ---    Scoliosis.        ---    Spinal stenosis s/p steroids injection.        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 Katrinka [name] 2010--- never infected, but packed with antibiotics with  surgery;.        ---    Right rotator cuff repairs x 4, complicated by repeat tears, infection,  placement of anchor material.        ---    Elevated liver function tests.        ---    Diabetes.        ---    Seronegative RA.        ---      **Surgical History**    ---      Right rotator cuff repair, with infected hardware that had to be removed  2006    ---    Repeat right  shoulder surgery, also which became infected. 2007    ---    Left rotator cuff repair 2008    ---    bilateral knee replacements 2009    ---    Left arthroscopic carpal tunnel release 01/2011    ---    ORIF Left 4th metatarsal bone    ---    Left Ulna shortening 1994    ---    Knee replacement 2010    ---    Hand surgery 2013, 2015    ---    remove gallbladder/hernia 1990    ---    Plate left foot 3rd metatarsal 2008    ---      **Family History**    ---      Mother: deceased 58 yrs, lung cancer, hyperthyroidism, diagnosed with Other  malignant neoplasm of unspecified site    ---    Father: deceased 51s yrs, heart attack, Unspecified heart disease    ---    3 brother(s) .    ---    FH of arthritis and scleroderma\nFather deceased from MI\nMother deceased age  42 with lung cancer \\nBrother  with scleroderma \/ copd.    ---      **  Social History**    ---    Tobacco history: Never smoked.    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    Alcohol    _Former daily EtOH use in 20s_  Nonsmoker.    Lives with longstanding boyfriend.    ---      **Allergies**    ---      N.K.D.A.    ---    [name] Verified]      **Hospitalization/Major Diagnostic Procedure**    ---      as above    ---     **Review of Systems**    ---     _ORT_ :    Eyes No. Ear, Nose Throat No. Digestion, Stomach, Bowel No. Bladder Problems  No. Bleeding Problems No. Numbness/Tingling No. Anxiety/Depression No.  Fever/Chills/Fatigue No. Chest Pain/Tightness/Palpitations No. Skin Rash No.  Dental Problems No. Joint/Muscle Pain/Cramps Yes. Blackout/Fainting No. Other  No.          **Reason for Appointment**    ---      1\. s/p left proximal hamstring repair, 2ND P/O, f/u wound    ---     **History of Present Illness**    ---     _General_ :    The patient is a 59 year-old female who presents here today s/p Left proximal  hamstring repair on 04/07/2019. The patient is now 3.5 weeks out from the  procedure.  During our last visit, she noted that the incision site "split  open" about 1 week after her surgery. Today she is here for close follow up of  her wound.    She has has noted that the wound has been healing well. Minimal to no  drainage. She has been keeping the incision covered with a dry dressing and  changing the dressing daily. Denies fever or chills. Denies numbness or  tingling. She has started physical therapy which has caused her leg to be sore  afterwards.      **Vital Signs**    ---    Pain scale 5, Ht-in 62, Ht-cm 157.48.      **Physical Examination**    ---    On examination today, the patient is a well-appearing, pleasant 59 year-old  female in no acute distress.    On examination of the Left lower extremity, the dry dressing was removed.  There is a transverse incision at the gluteal crease with no surrounding  erythema. There is no drainage, fluctuance or signs of infection. No  significant swelling.      **Assessments**    ---    1\. Tear of left hamstring, subsequent encounter - S76.312D (Primary)    ---      **Treatment**    ---      **1\. Tear of left hamstring, subsequent encounter**    Notes: The patient was seen and evaluated with Dr. Leandro Reasoner today. She is a 59  year-old female who presents here today for follow up s/p Left proximal  hamstring repair on 04/06/18. The patient is now 3.5 weeks out from the  procedure. Today's visit is for wound check. There are no signs of infection  of the wound. She continues to do well and has started physical therapy. She  is on Lovenox 40 mg daily. She is overall doing well. She will continue with  the proximal hamstring tear rehab protocol. From a wound standpoint she is  clear to receive immunosuppressive therapy from Rheumatology for  other medical  issues. We recommend she follow up with Dr. Leandro Reasoner in 6 weeks for a 9 week  follow up. All quesitons were answered and patient agrees with the plan.    ---     **Follow Up**    ---    6 Weeks (Reason: 9  week postop)    **Appointment Provider:** [name]    Electronically signed by Chauncey Cruel , MD on 05/07/2019 at 04:52 PM EST    Sign off status: Completed        * * *        [facility]    [address]    [place], [place] [address]    Tel: [phone]    Fax: [phone]              * * *          Progress Note: [name] 05/02/2019    ---    Note generated by eClinicalWorks EMR/PM Software ([email or web address])

## 2019-05-05 ENCOUNTER — Ambulatory Visit: Admitting: Physician Assistant

## 2019-05-05 ENCOUNTER — Ambulatory Visit: Admitting: Rheumatology

## 2019-05-05 ENCOUNTER — Ambulatory Visit (HOSPITAL_BASED_OUTPATIENT_CLINIC_OR_DEPARTMENT_OTHER)

## 2019-05-05 ENCOUNTER — Ambulatory Visit

## 2019-05-05 ENCOUNTER — Ambulatory Visit: Admit: 2019-05-05

## 2019-05-05 HISTORY — PX: OTHER SURGICAL HISTORY: SHX170

## 2019-05-05 LAB — HX IMMUNOLOGY: HX C-REACTIVE PROTEIN - CRP: 2.02 mg/L (ref 0.00–7.48)

## 2019-05-05 LAB — HX HEM-ROUTINE
HX BASO #: 0 10*3/uL (ref 0.0–0.2)
HX BASO: 0 %
HX EOSIN #: 0 10*3/uL (ref 0.0–0.5)
HX EOSIN: 1 %
HX HCT: 41.5 % (ref 32.0–45.0)
HX HGB: 13.1 g/dL (ref 11.0–15.0)
HX IMMATURE GRANULOCYTE#: 0 10*3/uL (ref 0.0–0.1)
HX IMMATURE GRANULOCYTE: 0 %
HX LYMPH #: 2.6 10*3/uL (ref 1.0–4.0)
HX LYMPH: 28 %
HX MCH: 31.3 pg (ref 26.0–34.0)
HX MCHC: 31.6 g/dL — ABNORMAL LOW (ref 32.0–36.0)
HX MCV: 99.3 fL — ABNORMAL HIGH (ref 80.0–98.0)
HX MONO #: 0.9 10*3/uL — ABNORMAL HIGH (ref 0.2–0.8)
HX MONO: 10 %
HX MPV: 10.4 fL (ref 9.1–11.7)
HX NEUT #: 5.5 10*3/uL (ref 1.5–7.5)
HX NRBC #: 0 10*3/uL
HX NUCLEATED RBC: 0 %
HX PLT: 202 10*3/uL (ref 150–400)
HX RBC BLOOD COUNT: 4.18 M/uL (ref 3.70–5.00)
HX RDW: 13.6 % (ref 11.5–14.5)
HX SEG NEUT: 61 %
HX WBC: 9 10*3/uL (ref 4.0–11.0)

## 2019-05-05 LAB — HX CHEM-PANELS
HX BLOOD UREA NITROGEN: 25 mg/dL — ABNORMAL HIGH (ref 6–24)
HX CREATININE (CR): 0.9 mg/dL (ref 0.57–1.30)
HX GFR, AFRICAN AMERICAN: 83 mL/min/{1.73_m2}
HX GFR, NON-AFRICAN AMERICAN: 71 mL/min/{1.73_m2}

## 2019-05-05 LAB — HX CHEM-LFT
HX ALANINE AMINOTRANSFERASE (ALT/SGPT): 61 IU/L — ABNORMAL HIGH (ref 0–54)
HX ALKALINE PHOSPHATASE (ALK): 214 IU/L — ABNORMAL HIGH (ref 40–130)
HX ASPARTATE AMINOTRANFERASE (AST/SGOT): 33 IU/L (ref 10–42)
HX BILIRUBIN, DIRECT: 0.3 mg/dL (ref 0.0–0.5)
HX BILIRUBIN, TOTAL: 0.7 mg/dL (ref 0.2–1.1)

## 2019-05-05 LAB — HX CHEM-OTHER
HX ALBUMIN: 3.6 g/dL (ref 3.4–4.8)
HX PROTEIN, TOTAL: 6.5 g/dL (ref 6.0–8.3)

## 2019-05-05 LAB — HX HEM-MISC: HX SED RATE: 18 mm (ref 0–30)

## 2019-05-05 MED ORDER — Hydroxychloroquine Sulfate: 200 | 90 | Freq: Every day | 1 refills | 0 days | Status: AC

## 2019-05-15 ENCOUNTER — Ambulatory Visit

## 2019-05-15 MED ORDER — GABAPENTIN: 1 | 30 | 1 refills | 0 days | Status: AC

## 2019-05-20 ENCOUNTER — Ambulatory Visit

## 2019-05-20 ENCOUNTER — Ambulatory Visit (HOSPITAL_BASED_OUTPATIENT_CLINIC_OR_DEPARTMENT_OTHER)

## 2019-05-20 ENCOUNTER — Ambulatory Visit: Admitting: Sports Medicine

## 2019-05-20 ENCOUNTER — Ambulatory Visit: Admitting: Rheumatology

## 2019-05-20 ENCOUNTER — Ambulatory Visit: Admit: 2019-05-20

## 2019-05-20 LAB — HX HEM-ROUTINE
HX BASO #: 0 10*3/uL (ref 0.0–0.2)
HX BASO: 0 %
HX EOSIN #: 0 10*3/uL (ref 0.0–0.5)
HX EOSIN: 0 %
HX HCT: 41.4 % (ref 32.0–45.0)
HX HGB: 13.3 g/dL (ref 11.0–15.0)
HX IMMATURE GRANULOCYTE#: 0.1 10*3/uL (ref 0.0–0.1)
HX IMMATURE GRANULOCYTE: 1 %
HX LYMPH #: 0.4 10*3/uL — ABNORMAL LOW (ref 1.0–4.0)
HX LYMPH: 3 %
HX MCH: 31.4 pg (ref 26.0–34.0)
HX MCHC: 32.1 g/dL (ref 32.0–36.0)
HX MCV: 97.6 fL (ref 80.0–98.0)
HX MONO #: 0.3 10*3/uL (ref 0.2–0.8)
HX MONO: 2 %
HX MPV: 10 fL (ref 9.1–11.7)
HX NEUT #: 13.8 10*3/uL — ABNORMAL HIGH (ref 1.5–7.5)
HX NRBC #: 0 10*3/uL
HX NUCLEATED RBC: 0 %
HX PLT: 264 10*3/uL (ref 150–400)
HX RBC BLOOD COUNT: 4.24 M/uL (ref 3.70–5.00)
HX RDW: 13.3 % (ref 11.5–14.5)
HX SEG NEUT: 94 %
HX WBC: 14.6 10*3/uL — ABNORMAL HIGH (ref 4.0–11.0)

## 2019-05-20 LAB — HX CHEM-PANELS
HX BLOOD UREA NITROGEN: 26 mg/dL — ABNORMAL HIGH (ref 6–24)
HX CREATININE (CR): 0.98 mg/dL (ref 0.57–1.30)
HX GFR, AFRICAN AMERICAN: 75 mL/min/{1.73_m2}
HX GFR, NON-AFRICAN AMERICAN: 64 mL/min/{1.73_m2}

## 2019-05-20 LAB — HX CHEM-LFT
HX ALANINE AMINOTRANSFERASE (ALT/SGPT): 60 IU/L — ABNORMAL HIGH (ref 0–54)
HX ALKALINE PHOSPHATASE (ALK): 230 IU/L — ABNORMAL HIGH (ref 40–130)
HX ASPARTATE AMINOTRANFERASE (AST/SGOT): 37 IU/L (ref 10–42)
HX BILIRUBIN, DIRECT: 0.4 mg/dL (ref 0.0–0.5)
HX BILIRUBIN, TOTAL: 0.7 mg/dL (ref 0.2–1.1)

## 2019-05-20 LAB — HX CHEM-OTHER
HX ALBUMIN: 4.1 g/dL (ref 3.4–4.8)
HX PROTEIN, TOTAL: 6.9 g/dL (ref 6.0–8.3)

## 2019-05-20 LAB — HX HEM-MISC: HX SED RATE: 19 mm (ref 0–30)

## 2019-05-20 LAB — HX IMMUNOLOGY: HX C-REACTIVE PROTEIN - CRP: 6.96 mg/L (ref 0.00–7.48)

## 2019-05-20 NOTE — Progress Notes (Signed)
* * *      [name], [name] **DOB:** [birth year] (59 yo F) **Acc No.** [id number] **DOS:**  05/20/2019    ---       [name], [name]**    ------    62 Y old Female, DOB: 15-Jun-1960    [address] [place], [place] [address]    Home: [phone]    Provider: Rhoderick Moody        * * *    Telephone Encounter    ---    Answered by  [name] Date: 05/20/2019       Time: 03:20 PM    Caller  Patient    ------            Reason  Brace question            Message                     Patient would like a call regarding her left leg brace.  Her physical therapist would like to know when she can disregard either: (1) the brace or (2) the 40 degree angle & make it a free range while wearing the brace      Phone#: [phone]                Action Taken                     [facility]  05/20/2019 3:44:48 PM > Called and spoke with the patient. She is currently 6 weeks out from surgery. Per Dr. Deatra Robinson hamstring repair protcol the patient may have ROM as in tolerated in the brace after 4 weeks and may start to wean out of the brace at 6 weeks from DOS. She states her PT feels she is making great progress. Instructed her to call back with any further concerns. All questions answered.                    * * *                ---          * * *         Provider: Rhoderick Moody 05/20/2019    ---    Note generated by eClinicalWorks EMR/PM Software ([email or web address])

## 2019-05-26 ENCOUNTER — Ambulatory Visit

## 2019-05-29 ENCOUNTER — Ambulatory Visit: Admitting: Rheumatology

## 2019-05-29 NOTE — Progress Notes (Signed)
 [name], Sativa [name] **DOB:** 07/21/1960 (59 yo F) **Acc No.** [id number] **DOS:**  05/29/2019    ---       [name], [name]**    ------    59 Y old Female, DOB: [birth year]    [address], [place], [place] [address]    Home: [phone]    Provider: [name]        * * *    Telephone Encounter    ---    Answered by  Oletha Blend Date: 05/29/2019       Time: 11:57 AM    Reason  ER visit    ------            Message                     patient called and said she would like a call back, i asked her whats going on and she said she has an appointment tomorrow and she doesnt feel good. I tried to get as much info but she got angry and hung up in my face. I let her know you may not get back to her todays because we ask for 48 hours.                Action Taken                     [facility]  05/29/2019 12:06:13 PM > Pt started to feel sick on monday night in the middle of the night.  Had diarrhea x2 days, but has ressovled.  Afebrile.  She has been flaring for about two weeks, hands swollen, breaks in skin.  She sound labored breathing on the phone.  Denies CP, abdominal pain,   She tells me she has only gotten worse since Monday.  While it is hard to tell over the phone whether this is scleroderma or unrelated etiology, we agreed the best plan is for her to go to the ER and be evaluated.  She has a friend who can drive her there.  She will call me and update me.      [name],[name]  05/29/2019 3:40:11 PM > [facility].      [name]  05/30/2019 8:22:53 AM > SHE LEFT A voicemail around midnight. [facility] in [employer]. no covid, no flu. "Bleeding in her bowels, stomach, or intestines." she's not entirely sure. Extremely anemic, 2bags of antibiotics, 3 bags of liquids. Also gave her protonix? and pantoprazol? to remove the acid from her stomach so the GI doctor can do some testing. She was being kept overnight and states she'll call again when she knows more.       [name],[name]  05/30/2019 12:18:40 PM >  Pt called back this AM      [name],[name]  05/30/2019 3:32:49 PM > Neg COVID and flu.  Upper scope this AM and saw some blood in esophagus and inflammation in the stomach.  She is anemic/dehydrated.  Lower scope tomorrow.  She has gotten blood and fluids and antibiotics.  She would like me to update [name].      [name],[name]  06/03/2019 10:30:26 AM > Diverticulosis.  Scopes looked good, no longer bleeding.  Hgb/Hct improving but still low.  Hydroxychloroquine and glipizide interaction per inpt doc?  needs repeaat labs in one week.  She is in touch with PCP and GI.  I will update and review discharge summary with [name].  [name],[name]  06/06/2019 12:49:51 PM > Pt LVM that she cannot get in to see her PCP and is going to urgent care for LE edema.  I returned call and LVMx1.      [facility]  06/06/2019 1:06:37 PM > Urgent care ordered labs.  Getting ECHO, had [place] of right leg-->neg, EKG normal.  PCP appt Tuesday this week.      [name],[name]  06/10/2019 1:59:35 PM > Hgb/Hct stable.      [name],[name]  06/13/2019 1:51:05 PM > Hi Admin.  Pt needs a f/u with [name] please!      Ye,[name]  06/16/2019 9:08:12 AM > booked for 3/19 at 9:20 with [name].                    * * *                ---          * * *         Provider: [name] 05/29/2019    ---    Note generated by eClinicalWorks EMR/PM Software ([email or web address])

## 2019-05-30 ENCOUNTER — Ambulatory Visit: Admitting: Student in an Organized Health Care Education/Training Program

## 2019-06-02 ENCOUNTER — Ambulatory Visit

## 2019-06-04 ENCOUNTER — Ambulatory Visit

## 2019-06-04 NOTE — Telephone Encounter (Signed)
 Call Details:   Patient PCP = [name] Goodie MD -JN 6A-  [name] (Patient) called on June 04, 2019 9:45 AM.  Message taken by: [name]  Primary call-back number: [phone]    Secondary call-back number: () -    Call Reason(s): Message/Call-Back      ** MESSAGE / CALL-BACK.  Regarding: Called in regards to her being admitted into the hospital last week Tuesday for some internal bleeding. Pt. states she is aware she has an upcoming appt. in which she would like to keep but is also requesting if she can get an appt. sometime this week with her pcp regarding this. Pt. is also requesting if she can receive a call back from her pcp as she would like to talk with her provider.     ---------- ---------- ---------- ---------- ---------- ----------       RESPONSE/ORDERS:    Phone contact- Hospital at [facility] for GI bleed last weekend and recently discharged. (Had endo and colonoscopy...). Currently "OK" with no bleeding or abdominal discomfort. Patient has an appt w/ [name] (JN) next Tuesday and requesting to see if appt can be moved up in order to quicken the process of trsnferring her car to a new PCP closer to home.  [name], please contact pt to see if an earlier appt can be obtained w/ [name] (JN) ......................................Marland KitchenCharyl [name]  June 04, 2019 10:06 AM    Dr. Tomasa [name] doesn't have any sooner appts  .....................................Marland KitchenMarland KitchenAntonio [name]  June 04, 2019 10:20 AM    Pt.called back in regards to needing to be seen, Pt. scheduled for an U/C for tomorrow. 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)  DIABETES MELLITUS, TYPE II (ICD-250.00) (ICD10-E11.9)  LONG-TERM (CURRENT) USE OF STEROIDS (ICD-V58.65) (ICD10-Z79.51)  SKIN SAGGING DUE TO WEIGHT LOSS (ICD-757.9) (ICD10-Q84.9)  TRANSAMINASES, SERUM, ELEVATED (ICD-790.4) (ICD10-R74.0)  OBESITY, BMI 30-34.9, ADULT (ICD-278.00) (ICD10-E66.9)      DIABETES MELLITUS, TYPE II, UNCONTROLLED (ICD-250.02) (ICD10-E11.65)      FATTY LIVER DISEASE (ICD-571.8) (ICD10-K76.0)  SCLERODERMA, 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)  HYPOTHYROIDISM (ICD-244.9) (ICD10-E03.9)  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) (ICD10-D80.8)  SPINAL STENOSIS, 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)  HEPATITIS C EXPOSURE (HCV RNA NEGATIVE, 01/2014) (ICD-V02.62) (ICD10-Z20.5)  PAIN IN JOINT, HAND (ICD-719.44) (ICD10-M79.643)  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)  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)  CARPAL TUNNEL (ICD-354.0) (ICD10-G56.00)  OSTEOARTHRITIS (ICD-715.09) (ICD10-M15.9)  S/P ROTATOR CUFF SURGERY 12/2004 - RT SHOULDER; 2008 LT (ICD-V45.89)  Family Hx of MELANOMA, FAMILY HX (ICD-V16.8) (ICD10-Z80.8)  FRACTURE OF OTHER SPEC SITE,  PATHOLOGIC - MULTIPLE (ICD-733.19)  CHOLECYSTECTOMY AND HERNIA REPAIR (ICD-V45.89)  VITAMIN D DEFICIENCY (ICD-268.9) (ICD10-E55.9)  COLONOSCOPY, NEXT 2020- SEE COMMENT (ICD-V76.51) (ICD10-Z01.89)    Meds (prior to this call):   HYDROXYCHLOROQUINE SULFATE 200 MG ORAL TABLET (HYDROXYCHLOROQUINE SULFATE) one tablet oral daily; Route: ORAL  JARDIANCE 10 MG ORAL TABLET (EMPAGLIFLOZIN) Take 1 tablet by mouth once daily; Route: ORAL  ROSUVASTATIN 10MG  TABLETS (ROSUVASTATIN CALCIUM) TAKE 1 TABLET BY MOUTH EVERY DAY  LISINOPRIL 2.5MG  TABLETS (LISINOPRIL) TAKE 1  TABLET BY MOUTH ONCE DAILY  METFORMIN HCL ER 500 MG XR24H-TAB (METFORMIN HCL) TAKE 4 TABLETS BY MOUTH  EVERY MORNING  GLIPIZIDE ER 2.5 MG XR24H-TAB (GLIPIZIDE) TAKE 1 TABLET BY MOUTH DAILY  OMEPRAZOLE 20 MG ORAL CAPSULE DELAYED RELEASE (OMEPRAZOLE) Take one capsule by mouth twice a day; Route: ORAL  IBUPROFEN 600 MG ORAL TABLET (IBUPROFEN) 1 tab by mouth TID as needed for pain  BACLOFEN 10MG  TABLETS (BACLOFEN) TAKE 1/2 TABLET BY MOUTH EVERY DAY AS NEEDED FOR BACK OR SPASMS  DICLOFENAC SODIUM 1 % TRANSDERMAL GEL (DICLOFENAC SODIUM) APP 2 GRAMS EXT AA BID      FREESTYLE FREEDOM LITE w/Device KIT (BLOOD GLUCOSE MONITORING SUPPL) use as directed (ICD 10 E11.65)      FREESTYLE LITE BLOOD GLUCOSE STRIPS (GLUCOSE BLOOD) USE TO CHECK BLOOD GLUCOSE THREE TIMES DAILY      FREESTYLE LANCETS (LANCETS) check fingerstick three times a day (ICD 10 E11.65)  TRAMADOL HCL 50 MG ORAL TABLET (TRAMADOL HCL) take one tablet daily as needed for pain; Route: ORAL  OXYCODONE HCL 5 MG ORAL TABLET (OXYCODONE HCL) Take one tablet daily as needed for pain >8. Partial fill upon patient request.; Route: ORAL  CALCIUM CARBONATE-VITAMIN D 600-200 MG-UNIT ORAL TABLET (CALCIUM CARBONATE-VITAMIN D) take one tablet twice a day; Route: ORAL  GABAPENTIN 300MG  CAPSULES (GABAPENTIN) TAKE 1 CAPSULE BY MOUTH EVERY NIGHT AT BEDTIME AS NEEDED FOR PAIN  * BLOODWORK Please draw chemistry (sodium, potassium, magnesium, chloride, bicarbonate, glucose, creatinine, BUN), CBC, TSH, lipids, HbA1c. Fax result to [phone] Dr. Marlane [name]  PREDNISONE 10 MG ORAL TABLET (PREDNISONE) Take 3 tablets once a day; Route: ORAL  FAMOTIDINE 20 MG ORAL TABLET (FAMOTIDINE) Take 1 tablet  at night as needed.; Route: ORAL  BACTRIM 400-80 MG ORAL TABLET (SULFAMETHOXAZOLE-TRIMETHOPRIM) ; Route: ORAL            Created By [name] on 06/04/2019 at 09:45 AM    Electronically Signed By [name] Dancer RN on 06/30/2019 at 04:26 PM

## 2019-06-06 ENCOUNTER — Ambulatory Visit

## 2019-06-06 ENCOUNTER — Ambulatory Visit: Admit: 2019-06-06

## 2019-06-06 LAB — HX CHEM-OTHER
HX CALCIUM (CA): 8.9 mg/dL (ref 8.5–10.5)
HX FERRITIN: 210 ng/mL (ref 10–240)
HX MAGNESIUM: 2.3 mg/dL (ref 1.6–2.6)
HX PHOSPHORUS: 3 mg/dL (ref 2.7–4.5)

## 2019-06-06 LAB — HX HEM-ROUTINE
HX BASO #: 0 10*3/uL (ref 0.0–0.2)
HX BASO: 1 %
HX EOSIN #: 0.1 10*3/uL (ref 0.0–0.5)
HX EOSIN: 1 %
HX HCT: 29.9 % — ABNORMAL LOW (ref 32.0–45.0)
HX HGB: 8.9 g/dL — ABNORMAL LOW (ref 11.0–15.0)
HX IMMATURE GRANULOCYTE#: 0.1 10*3/uL (ref 0.0–0.1)
HX IMMATURE GRANULOCYTE: 1 %
HX LYMPH #: 1.5 10*3/uL (ref 1.0–4.0)
HX LYMPH: 16 %
HX MCH: 30 pg (ref 26.0–34.0)
HX MCHC: 29.8 g/dL — ABNORMAL LOW (ref 32.0–36.0)
HX MCV: 100.7 fL — ABNORMAL HIGH (ref 80.0–98.0)
HX MONO #: 0.8 10*3/uL (ref 0.2–0.8)
HX MONO: 8 %
HX MPV: 9.5 fL (ref 9.1–11.7)
HX NEUT #: 6.9 10*3/uL (ref 1.5–7.5)
HX NRBC #: 0 10*3/uL
HX NUCLEATED RBC: 0 %
HX PLT: 341 10*3/uL (ref 150–400)
HX RBC BLOOD COUNT: 2.97 M/uL — ABNORMAL LOW (ref 3.70–5.00)
HX RDW: 15.9 % — ABNORMAL HIGH (ref 11.5–14.5)
HX SEG NEUT: 73 %
HX WBC: 9.5 10*3/uL (ref 4.0–11.0)

## 2019-06-06 LAB — HX CHEM-LIPIDS
HX CHOL-HDL RATIO: 2
HX CHOLESTEROL: 131 mg/dL (ref 110–199)
HX HIGH DENSITY LIPOPROTEIN CHOL (HDL): 67 mg/dL (ref 35–75)
HX HOURS FAST: 1 h
HX LDL: 42 mg/dL (ref 0–129)
HX TRIGLYCERIDES: 109 mg/dL (ref 40–250)

## 2019-06-06 LAB — HX CHEM-PANELS
HX ANION GAP: 8 (ref 3–14)
HX BLOOD UREA NITROGEN: 15 mg/dL (ref 6–24)
HX CHLORIDE (CL): 106 meq/L (ref 98–110)
HX CO2: 25 meq/L (ref 20–30)
HX CREATININE (CR): 0.73 mg/dL (ref 0.57–1.30)
HX GFR, AFRICAN AMERICAN: 106 mL/min/{1.73_m2}
HX GFR, NON-AFRICAN AMERICAN: 92 mL/min/{1.73_m2}
HX POTASSIUM (K): 4.1 meq/L (ref 3.6–5.1)
HX SODIUM (NA): 139 meq/L (ref 135–145)

## 2019-06-06 LAB — HX CHOLESTEROL
HX CHOLESTEROL: 131 mg/dL (ref 110–199)
HX HIGH DENSITY LIPOPROTEIN CHOL (HDL): 67 mg/dL (ref 35–75)
HX LDL: 42 mg/dL (ref 0–129)
HX TRIGLYCERIDES: 109 mg/dL (ref 40–250)

## 2019-06-06 LAB — HX CHEM-METABOLIC
HX HEMOGLOBIN A1C: 6.5 % — ABNORMAL HIGH
HX THYROID STIMULATING HORMONE (TSH): 3.37 u[IU]/mL (ref 0.35–4.94)

## 2019-06-06 LAB — HX DIABETES: HX HEMOGLOBIN A1C: 6.5 % — ABNORMAL HIGH

## 2019-06-06 NOTE — Telephone Encounter (Signed)
 RESPONSE/ORDERS:    Called pt re: results. Cr and K okay. CBC stable from reported discharge value. Can trial lasix po 20mg  daily x 3 days. Has follow up with PCP on 3/9 and can reassess after that. Have not sent med to pharmacy as I have not been able to reach her.  ......................................Marland KitchenArna Snipe MD  June 06, 2019 1:26 PM    Trued reaching patient again without answer.  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) (ICD10-M47.812)  NECK PAIN (ICD-723.1) (ICD10-M54.2)  STEROID USE, LONG TERM (ICD-V58.65) (ICD10-Z79.52)  PREMATURE VENTRICULAR CONTRACTIONS (ICD-427.69) (ICD10-I49.3)  HYPERLIPIDEMIA (ICD-272.4) (ICD10-E78.5)  BACK PAIN, LUMBAR, CHRONIC (ICD-724.2) (ICD10-M54.5)  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)  DIABETES MELLITUS, TYPE II (ICD-250.00) (ICD10-E11.9)  LONG-TERM (CURRENT) USE OF STEROIDS (ICD-V58.65) (ICD10-Z79.51)  SKIN SAGGING DUE TO WEIGHT LOSS (ICD-757.9) (ICD10-Q84.9)  TRANSAMINASES, SERUM, ELEVATED (ICD-790.4) (ICD10-R74.0)  OBESITY, BMI 30-34.9, ADULT (ICD-278.00) (ICD10-E66.9)      DIABETES MELLITUS, TYPE II, UNCONTROLLED (ICD-250.02) (ICD10-E11.65)      FATTY LIVER DISEASE (ICD-571.8) (ICD10-K76.0)  SCLERODERMA, 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)  HYPOTHYROIDISM (ICD-244.9) (ICD10-E03.9)  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) (ICD10-D80.8)  SPINAL STENOSIS, 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)  HEPATITIS C EXPOSURE (HCV RNA NEGATIVE, 01/2014) (ICD-V02.62) (ICD10-Z20.5)  PAIN IN JOINT, HAND (ICD-719.44) (ICD10-M79.643)  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)  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)  (ICD10-D14.30)  CARPAL TUNNEL (ICD-354.0) (ICD10-G56.00)  OSTEOARTHRITIS (ICD-715.09) (ICD10-M15.9)  S/P ROTATOR CUFF SURGERY 12/2004 - RT SHOULDER; 2008 LT (ICD-V45.89)  Family Hx of MELANOMA, FAMILY HX (ICD-V16.8) (ICD10-Z80.8)  FRACTURE OF OTHER SPEC SITE,  PATHOLOGIC - MULTIPLE (ICD-733.19)  CHOLECYSTECTOMY AND HERNIA REPAIR (ICD-V45.89)  VITAMIN D DEFICIENCY (ICD-268.9) (ICD10-E55.9)  COLONOSCOPY, NEXT 2020- SEE COMMENT (ICD-V76.51) (ICD10-Z01.89)    Meds (prior to this call):   HYDROXYCHLOROQUINE SULFATE 200 MG ORAL TABLET (HYDROXYCHLOROQUINE SULFATE) one tablet oral daily; Route: ORAL  JARDIANCE 10 MG ORAL TABLET (EMPAGLIFLOZIN) Take 1 tablet by mouth once daily; Route: ORAL  ROSUVASTATIN 10MG  TABLETS (ROSUVASTATIN CALCIUM) TAKE 1 TABLET BY MOUTH EVERY DAY  LISINOPRIL 2.5MG  TABLETS (LISINOPRIL) TAKE 1 TABLET BY MOUTH ONCE DAILY  METFORMIN HCL ER 500 MG XR24H-TAB (METFORMIN HCL) TAKE 4 TABLETS BY MOUTH EVERY MORNING  GLIPIZIDE ER 2.5 MG XR24H-TAB (GLIPIZIDE) TAKE 1 TABLET BY MOUTH DAILY  OMEPRAZOLE 20 MG ORAL CAPSULE DELAYED RELEASE (OMEPRAZOLE) Take one capsule by mouth twice a day; Route: ORAL  IBUPROFEN 600 MG ORAL TABLET (IBUPROFEN) 1 tab by mouth TID as needed for pain  BACLOFEN 10MG  TABLETS (BACLOFEN) TAKE 1/2 TABLET BY MOUTH EVERY DAY AS NEEDED FOR BACK OR SPASMS  DICLOFENAC SODIUM 1 % TRANSDERMAL GEL (DICLOFENAC SODIUM) APP 2 GRAMS EXT AA BID      FREESTYLE FREEDOM LITE w/Device KIT (BLOOD GLUCOSE MONITORING SUPPL) use as directed (ICD 10 E11.65)      FREESTYLE LITE BLOOD GLUCOSE STRIPS (GLUCOSE BLOOD) USE TO CHECK BLOOD GLUCOSE THREE TIMES DAILY      FREESTYLE LANCETS (LANCETS) check fingerstick three times a day (ICD 10 E11.65)  TRAMADOL HCL 50 MG ORAL TABLET (TRAMADOL HCL) take one tablet daily as needed for pain; Route: ORAL  OXYCODONE HCL 5 MG ORAL TABLET (OXYCODONE HCL) Take one tablet  daily as needed for pain >8. Partial fill upon patient request.; Route: ORAL  CALCIUM CARBONATE-VITAMIN D 600-200  MG-UNIT ORAL TABLET (CALCIUM CARBONATE-VITAMIN D) take one tablet twice a day; Route: ORAL  GABAPENTIN 300MG  CAPSULES (GABAPENTIN) TAKE 1 CAPSULE BY MOUTH EVERY NIGHT AT BEDTIME AS NEEDED FOR PAIN  * BLOODWORK Please draw chemistry (sodium, potassium, magnesium, chloride, bicarbonate, glucose, creatinine, BUN), CBC, TSH, lipids, HbA1c. Fax result to [phone] Dr. Marlane Hatcher  PREDNISONE 10 MG ORAL TABLET (PREDNISONE) Take 3 tablets once a day; Route: ORAL  FAMOTIDINE 20 MG ORAL TABLET (FAMOTIDINE) Take 1 tablet  at night as needed.; Route: ORAL  BACTRIM 400-80 MG ORAL TABLET (SULFAMETHOXAZOLE-TRIMETHOPRIM) ; Route: ORAL            Created By Arna Snipe MD on 06/06/2019 at 01:24 PM    Electronically Signed By Arna Snipe MD on 06/09/2019 at 08:33 AM

## 2019-06-06 NOTE — Progress Notes (Signed)
 General Medicine Visit  .  [name]  DIAB EYE EX, LDL, MAMMOGRAM.    .  * Byron: Sheikh (Nafisa)  .  Patient Details and Vitals  Patient reviewed in/by:  Office  Patient Best Phone # [phone]  Patient Email Address [name]  .  BMI: 27.82  .  BP: 117/ 76  Ht (inches): 62.5  Weight: 154  BMI: 27.82  Temp: 98.1  Pulse: 50  O2 Sat: 97  .  Call/Visit Details:   swollen legs and feet  Med List: PRINTED by [facility] for patient   hd_medl: printed  .  [name]  Patient Medical History   Travel outside of the [place] in past 28 days:: No  .  In the past year, have you ... Had no falls  Difficulty with balance? NO  Use a Cane NO  Use a Walker NO  Need assistance with ambulation while here? NO  .  [name]  Tobacco use? never smoker  Does patient experience chronic pain ? YES  Severity of pain? (min=0, max=10) 5  .  Patient Screening   .  .  .  .  PHQ2 (Q1) Little Interest or pleasure in doing things: 0  PHQ2 (Q2) Feeling down, depressed or hopeless: 0  PHQ2 Score: 0  .  Abuse/Neglect (Q1) Feel unsafe in relationship: NO  Abuse/Neglect (Q2) Hurt in past year: NO  .  .  .  .  .  Data to be shared to Telemed/Office Visits   ......................................Marland KitchenNafisa Sheikh  June 06, 2019 8:25 AM  .  .  Patient Prep Updates   .  .  .  .  .  .  .  .  Chief Complaint:   discharge follow up, lower extremity edema  History of Present Illness:   Wasn't feeling well last week  Had diarrhea with blood and vomiting  Stopped eating and drinking  Went to the ED on 2/25  Admitted for GI bleed (anemia, dehydration)  Got blood transufusion and ?antibiotics and IVF  Had colkonoscopy and endoscopy (stomach looked good, diagnosed with diverti  culosis but no diverticulitis)  Told to follow up for capsule endoscopy  Has a lot of bruising on left arm due to IV placement  Discharge 2/27  On 3/2 began to notice leg swelling and is now having trouble walking due t  o legsfeeling tight  no SOB, no CP, no palpitations  Bowel movements normal consistency this  morning, no abdominal pain  Did not note blood in stool today but it was "dark" ("grey/charcoal")  Having about two a day which is normal but maybe overall a bit more constip  ated since discharge  HIghest blood count was 8.8 in hospital (was 14 before) all per patient  .  Surgery history- hamstrong reattachment hx  .  .  .  Past Medical History:(reviewed)  HEALTH CARE MAINTENANCE  STD- low risk, deferred  Pap smear - Negative co-testing in 2015. Performed 01/17/2019.  Mammogram - normal 03/2017. normal 02/01/2018. repeat 2020 sheduled  Colonoscopy -  done 2016, repeat 2017 and both times poor prep but overall   has been sufficient for screening purposes.   In combination, these two col  onoscopies have likely given an adequate colon cancer screening. Recommend   repeat colonoscopy in 3 years.  1 polyp removed, next colonoscopy in 3 yrs   (2020). ***At that time in 2020, will do 7 days of daily miralax. Seven day  s low fiber diet. Two days  clear liquids and a split prep with 2 dulcolax b  efore each dose of golytely.  DEXA (>66yr) - 2017 normal bone mass; on chronic prednisone use. 02/01/2018   nml bone mass, next due 2022  Lung cancer screening (55-80)- N/A  Influenza - annually  HPV - N/A  Hep C (1610-9604) - non reactive 2017  Shingles shot - recommended she get Shingrix when/where it's available  Pneumonia - PPSV23 01/26/2017. Due for booster at age 11.  Tetanus and TDAP 11/04/2014  Contraception - post menopausal  LDL - 110 (01/04/2018)  HGBA1C - 7.7  (01/04/2018)  .  Family History: (reviewed)   Father: DMII, died of MI at 41  Mom: died of lung CA at 97  MGM and PGM: lung ca (smokers)  MGF and several other second degree relatives: brain aneurysms in their 35'  s.  2 maternal uncles:melanoma (one died of unknown cancer, one living)  brother recently diagnosed with systemic scleroderma c/b gangrene of fingers  .  Social History: (reviewed)   Non-smoker, rare etoh, no IVDU. 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 Separated from life partner of 20 years jan 20  20 and now [place] move [place]. No children. Always wears a seat belt.  .  .  .  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)  DIABETES MELLITUS, TYPE II (ICD-250.00) (ICD10-E11.9)  LONG-TERM (CURRENT) USE OF STEROIDS (ICD-V58.65) (ICD10-Z79.51)  SKIN SAGGING DUE TO WEIGHT LOSS (ICD-757.9) (ICD10-Q84.9)  TRANSAMINASES, SERUM, ELEVATED (ICD-790.4) (ICD10-R74.0)  OBESITY, BMI 30-34.9, ADULT (ICD-278.00) (ICD10-E66.9)      DIABETES MELLITUS, TYPE II, UNCONTROLLED (ICD-250.02) (ICD10-E11.65)      FATTY LIVER DISEASE (ICD-571.8) (ICD10-K76.0)  SCLERODERMA, 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)  HYPOTHYROIDISM (ICD-244.9) (ICD10-E03.9)  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) (ICD10-D  80.8)  SPINAL STENOSIS, 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)  HEPATITIS C EXPOSURE (HCV RNA NEGATIVE, 01/2014) (ICD-V02.62) (ICD10-Z20.5)  PAIN IN JOINT, HAND (ICD-719.44) (ICD10-M79.643)  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  Past Medical History (changes today):  Added new problem of GI BLEED (ICD-578.9) (JYN82-N56.2)  Added new problem of IRREGULAR HEART BEATS (ICD-427.9) (ICD10-I49.9)  Added new problem of LOWER EXTREMITY EDEMA (ICD-782.3) (ICD10-R60.0)  .  [name]  Medications (prior to today's visit):  HYDROXYCHLOROQUINE SULFATE 200 MG ORAL TABLET (HYDROXYCHLOROQUINE SULFATE)   one tablet oral daily; Route: ORAL  JARDIANCE 10 MG ORAL TABLET (EMPAGLIFLOZIN) Take 1 tablet by mouth once dai  ly; Route: ORAL  ROSUVASTATIN 10MG  TABLETS (ROSUVASTATIN CALCIUM) TAKE 1 TABLET BY MOUTH EVE  RY DAY  LISINOPRIL 2.5MG  TABLETS (LISINOPRIL) TAKE 1 TABLET BY MOUTH ONCE DAILY  METFORMIN HCL ER 500 MG XR24H-TAB (METFORMIN HCL) TAKE 4 TABLETS BY MOUTH E  VERY MORNING  GLIPIZIDE ER 2.5 MG  XR24H-TAB (GLIPIZIDE) TAKE 1 TABLET BY MOUTH DAILY  OMEPRAZOLE 20 MG ORAL CAPSULE DELAYED RELEASE (OMEPRAZOLE) Take one capsule   by mouth twice a day; Route: ORAL  IBUPROFEN 600 MG ORAL TABLET (IBUPROFEN) 1 tab by mouth TID as needed for p  ain  BACLOFEN 10MG  TABLETS (BACLOFEN) TAKE 1/2 TABLET BY MOUTH EVERY DAY AS NEED  ED FOR BACK OR SPASMS  DICLOFENAC SODIUM 1 % TRANSDERMAL GEL (DICLOFENAC SODIUM) APP 2 GRAMS EXT A  A BID      FREESTYLE FREEDOM LITE w/Device KIT (BLOOD GLUCOSE MONITORING SUPPL) [place]  e as directed (ICD 10 E11.65)      FREESTYLE LITE BLOOD GLUCOSE STRIPS (GLUCOSE BLOOD) USE TO CHECK BLOOD   GLUCOSE THREE TIMES DAILY      FREESTYLE LANCETS (LANCETS) check fingerstick three times a day (ICD 10   E11.65)  TRAMADOL HCL 50 MG ORAL TABLET (TRAMADOL HCL) take one tablet daily as need  ed for pain; Route: ORAL  OXYCODONE HCL 5 MG ORAL TABLET (OXYCODONE HCL) Take one tablet daily as nee  ded for pain >8. Partial fill upon patient request.; Route: ORAL  CALCIUM CARBONATE-VITAMIN D 600-200 MG-UNIT ORAL TABLET (CALCIUM CARBONATE-  VITAMIN D) take one tablet twice a day; Route: ORAL  GABAPENTIN 300MG  CAPSULES (GABAPENTIN) TAKE 1 CAPSULE BY MOUTH  EVERY NIGHT   AT BEDTIME AS NEEDED FOR PAIN  * BLOODWORK Please draw chemistry (sodium, potassium, magnesium, chloride,   bicarbonate, glucose, creatinine, BUN), CBC, TSH, lipids, HbA1c. Fax result   to [phone] Dr. Marlane Hatcher  PREDNISONE 10 MG ORAL TABLET (PREDNISONE) Take 3 tablets once a day; Route:   ORAL  FAMOTIDINE 20 MG ORAL TABLET (FAMOTIDINE) Take 1 tablet  at night as needed  .; Route: ORAL  BACTRIM 400-80 MG ORAL TABLET (SULFAMETHOXAZOLE-TRIMETHOPRIM) ; Route: ORAL  .  No Changes to Medication List   .  .  .  .  .  .  .  Vitals:   Ht: 62.5 in.  Wt: 154 lbs.  BMI (in-lb) 27.82  Temp: 98.1deg F.     BP (Initial [place] Screening): 117 / 76     BP (Rechecked, Actionable): 117 / 7  6 mmHg   Pulse Rate: 50 bpm O2 Sat: 97 %  .  [name]  Additional PE:   Constitutional: Alert, no acute distress, well hydrated, well developed, we  ll nourished  Eyes:no injection, no icterus  Ears: gross hearing intact  Respiratory: No respiratory distress, no accessory muscle use, CTAB  Irregular pulse, ?bradycardic  Neck,Spine,ribs,pelvis: normal gait  Psych: Oriented to all spheres, affect and mood appropriate  LEs: 1+ pitting edema of BLE R>L, No calf pain, Chronic erythema and peelin  g of BL feet  Ab: Normal bowel sounds, non-tender to palpation, no rebound, no guarding  Skin: Large ecchymosis on RUE without palpable hematoma, normal pulses in b  ue   .  Assessment /T/ Plan:   GIB: Reports recent admission for GIB without ongoing symptoms of anemiaat   this time. Per patient, colo and egd did not show source and capsule was re  commended. Did have dark stool yesterday but not clear this was melena.  - CBC w hgb 8.9. This is stable per patient report. Also obtained ferritin,   can consider iron supplementation if low.  - Refer to GI for capsule endoscopy  - Obtain OSH records  - Discussed close monitoring. If signs of melana, BRBPR, symptoms of anemia   or abdominal pain should return  to the hospital.   .  Lower extremity edema: feel  likely due to large volume fluids and blood whi  le inpatient. However assymetric and recent admission wo ppx so DVT possibl  e and obtained uplex which was negative. Less likely new HF, renal etiology.  - compression stocking and elevation  - duplex today negative  - BMP  - If above normal- can trial lasix 20mg  po for 3 days  - Consider TTE moving forward  .  Frequent PVCs: Consistent with prior EKGs. appears baseline. patient asympt  omatic./   - Check lytes  .  Bruising: Appears expected iso trauma due to IVs. No thrombophelmbitis, hem  atomas noted.  [name]  HCM:  - Check lipids, A1C, TSH with labs for upcoming annual exam  .  ......................................Marland KitchenArna Snipe MD  June 06, 2019 1  0:40 AM  .  .  .  Orders (this visit):  LYTES [SODIUM] [CPT-82495]  BUN [BUN] [CPT-84520]  Creatinine (CR) [CREATININE] [CPT-82565]  CBC/DIFF (with Plt) [WBC] [CPT-85025]  Ferritin [FERRITIN] [CPT-82728]  [facility] [Ref-Gastroenterology]  EKG - on site by [place] [CPT-93000]  Echo 2D Adult (Complete TTE) [CPT-93320]  Lower Extr Vein Unilateral Duplex [CPT-93923]  Lipid Profile-Chol, HDL, Trig, LDL(calc) [CHOLESTEROL] [CPT-80061]  Hemoglobin A1C (Glycohemoglobin) [HGBA1C] [CPT-83036]  TSH Reflex [TSH] [CPT-84439]  Magnesium [MAGNESIUM] [CPT-83735]  Phosphorus [PO4] [CPT-84100]  Calcium (CA) [CALCIUM] [CPT-82310]  Lower Extr Vein Unilateral Duplex [CPT-93923]  Est Level 4 (MDM or 30-39 min) [ZOX-09604]  .  Patient Care Plan  .  [name]  Immunization Worksheet 2019 (rev 05/16/2018)   .  .  .  .  .  .  .  .  .  .  .  .  .  Follow-up With:   .  [name]  Electronically Signed by Arna Snipe MD on 06/06/2019 at 10:43 AM  ________________________________________________________________________

## 2019-06-06 NOTE — Progress Notes (Signed)
 [facility]  [address]  [place] [address]  Main: [phone]  Fax: [phone]  Patient Portal: [email or web address]      June 06, 2019            MRN: [id number]            DOB: Jul 13, 1960   [name]   [address]   [place] [address]      The results of your recent tests are as follows:      Cholesterol Tests:      Total Cholesterol   131  Normal 110-199    06/06/2019    HDL (good cholesterol)  67  Normal >40     06/06/2019    Triglyceride    109  Normal 40-250    06/06/2019    LDL (bad cholesterol)   42  Normal <160    06/06/2019     Diabetes Tests:  Hemoglobin A1C is a marker of diabetes. Yours is elevated. You ca    HgbA1c:    6.5  Normal 4.3 - 5.6    06/06/2019     Blood Count Tests:      Hematocrit:    29.9  Normal 32-45 women, 37-47 men  06/06/2019    Hemoglobin:    8.9  Normal 13.5-16 female, 55-15 female 06/06/2019    White Blood Cells:   9.5  Normal 4 - 11    06/06/2019    Platelets:    341  Normal 150-400    06/06/2019    MCV:     100.7  Normal 80-96    06/06/2019     Kidney Tests:      BUN:     15  Normal 6 - 24    06/06/2019    Creatinine:    0.73  Normal 0.57 - 1.30   06/06/2019    GFR (Non African-American):  92  Normal >= 60    06/06/2019    GFR (African-American):  106  Normal >= 60    06/06/2019     Electrolyte Tests:      Sodium:    139 MEQ/L  Normal 135-145   06/06/2019    Potassium:    4.1 MEQ/L  Normal 3.6-5.1   06/06/2019    Chloride:    106 MEQ/L  Normal 98-110   06/06/2019    Bicarbonate:    25 MEQ/L  Normal 20 - 30   06/06/2019    Calcium:    8.9   Normal 8.5-10.5   06/06/2019    Magnesium:    2.3   Normal 1.6-2.6   06/06/2019    Phosphorus:    3.0   Normal 2.7-4.5   06/06/2019    Anion Gap:    8   Normal 5 - 18    06/06/2019       Sincerely,       Arna Snipe, MD   [facility]            Created By Court Joy on 06/06/2019 at 01:15 PM    Electronically Signed By Arna Snipe MD on 06/06/2019 at  01:24 PM

## 2019-06-06 NOTE — Progress Notes (Signed)
 [facility]  [address]  [place] [address]  Main: [phone]  Fax: [phone]  Patient Portal: [email or web address]        June 06, 2019            MRN: [id number]            DOB: Jun 21, 1960   [name]   [address]   [place] [address]      I am writing to let you know that the following appointments have been made for you.       Vascular Lab - Ultrasound  [address]  Your appointment is scheduled for 06/06/2019 at 10:00AM  If you need to make any changes, please call [phone]    Echo Lab  [address]  Your appointment is scheduled for 06/12/2019 at 7:00AM  If you need to make any changes, please call [phone]            Sincerely,         Arna Snipe, MD   [facility]          Created By Court Joy on 06/06/2019 at 09:21 AM    Electronically Signed By Arna Snipe MD on 06/06/2019 at 10:43 AM

## 2019-06-09 ENCOUNTER — Ambulatory Visit

## 2019-06-10 ENCOUNTER — Ambulatory Visit

## 2019-06-10 ENCOUNTER — Ambulatory Visit: Admitting: Geriatric Medicine

## 2019-06-10 ENCOUNTER — Ambulatory Visit: Admit: 2019-06-10

## 2019-06-10 NOTE — Progress Notes (Signed)
 General Medicine Visit - Resident note with preceptor addendum  .  DIAB EYE EX, MAMMOGRAM.    .  * [place]: [name] ([name])  .  Patient Details and Vitals  Patient reviewed in/by:  Office  Patient Best Phone # [phone]  Patient Email Address [name]  .  BMI: 27.49  .  BP: 120/ 81  Ht (inches): 62.5  Weight: 152.2  BMI: 27.49  Temp: 98.9  Pulse: 113  O2 Sat: 97  .  Med List: PRINTED by [facility] for patient   hd_medl: printed  .  [name]  Patient Medical History   Travel outside of the [place] in past 28 days:: No  .  In the past year, have you ... Had no falls  Difficulty with balance? NO  Use a Cane NO  Use a Walker NO  Need assistance with ambulation while here? NO  .  [name]  Tobacco use? never smoker  Does patient experience chronic pain ? YES  Severity of pain? (min=0, max=10) 5  .  Patient Screening   .  .  .  .  PHQ2 (Q1) Little Interest or pleasure in doing things: 0  PHQ2 (Q2) Feeling down, depressed or hopeless: 0  PHQ2 Score: 0  .  Abuse/Neglect (Q1) Feel unsafe in relationship: NO  Abuse/Neglect (Q2) Hurt in past year: NO  .  .  .  .  .  Data to be shared to Telemed/Office Visits   March  9, [name] 1:47 PM  Screening [place]: [name] ([name])  Chronic Pain, level = 5  PHQ2 Score: 0  .  ---------- ---------- ----------   ......................................Marland KitchenMerline [name]  March  9, [name] 1:47   PM  .  .  .  .  **Resident Note**  .  .  Patient Prep Updates   [place] Pre-Visit Notes:  March  9, [name] 1:47 PM  Screening [place]: [name] ([name])  Chronic Pain, level = 5  PHQ2 Score: 0  .  ---------- ---------- ----------   ......................................Marland KitchenMerline [name]  March  9, [name] 1:47   PM  .  .  .  .  .  .  .  .  .  * Preceptor: Margaretmary Bayley Aram Beecham)  CC: scheduled follow up  .  HPI: [name] is a 59 year old femal with history of scleroderma, IgG4 def  iciency, diabetes, HTN and HLD who presents for follow up visit and reports   recent hospital admission for GI bleed.   [name]  She was seen in clinic 3/5 by Dr. Pollyann Kennedy  for d/c follow up visit.   .  GI bleed  -recent hospitalized 10 days ago [facility] for GI bleed d/c   on 2/27  - EGD /T/ colonscopy done /T/ found nonbleeding erosive gastropathy, divert  iculosis in sigmoid colon, descending and travnserve colon, internal hemorr  hoids  - Hgb at discharge on 2/27 was 8.8, received 1 unit of blood /T/ venofer IV   in hospital  - seen by Dr. Pollyann Kennedy in [facility] on 3/6, Hbg 8.9  - stools are "olive green charcol color" in last 24 hrs, previously having   black stools  - currently no lightheadedness, though has been feeling tired since d/c fro  m hosptial  .  Swelling in bilateral LE  - reported bilateral extremity swelling when saw Dr. 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  Bruising  - Noticed bruising in arms during hospitalization, likely from steroid use  Appears expected iso trauma due to IVs. No thrombophelmbitis, hematomas not  ed.  .  .  Past Medical History:(reviewed)  HEALTH CARE MAINTENANCE  STD- low risk, deferred  Pap smear - 01/17/2019- nml neg HPV  Mammogram - normal 03/2017. normal 02/01/2018. repeat 2020 sheduled  Colonoscopy -  done 2016, repeat 2017 and both times poor prep but overall   has been sufficient for screening purposes.   In combination, these two col  onoscopies have likely given an adequate colon cancer screening. Recommend   repeat colonoscopy in 3 years.  1 polyp removed, next colonoscopy in 3 yrs   (2020). ***At that time in 2020, will do 7 days of daily miralax. Seven day  s low fiber diet. Two days clear liquids and a split prep with 2 dulcolax b  efore each dose of golytely.  DEXA (>82yr) - 2017 normal bone mass; on chronic prednisone use. 02/01/2018   nml bone mass, next due 2022  Lung  cancer screening (55-80)- N/A  Influenza - annually  HPV - N/A  Hep C (1610-9604) - non reactive 2017  Shingles shot - recommended she get Shingrix when/where it's available  Pneumonia - PPSV23 01/26/2017. Due for booster at age 89.  Tetanus and TDAP 11/04/2014  Contraception - post menopausal  LDL - 110 (01/04/2018)  HGBA1C - 7.7  (01/04/2018)  .  Family History: (reviewed)   Father: DMII, died of MI at 65  Mom: died of lung CA at 37  MGM and PGM: lung ca (smokers)  MGF and several other second degree relatives: brain aneurysms in their 66'  s.  2 maternal uncles:melanoma (one died of unknown cancer, one living)  brother recently diagnosed with systemic scleroderma c/b gangrene of fingers  .  Social History: (reviewed)   Non-smoker, rare etoh, no IVDU. 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 Separated from life partner of 20 years jan 20  20 and now planning to move [place]. No children. Always wears a seat belt.  .  Review of Systems: see pi  .  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) (ICD10-M47.812)  NECK PAIN (ICD-723.1) (ICD10-M54.2)  STEROID USE, LONG TERM (ICD-V58.65) (ICD10-Z79.52)  PREMATURE VENTRICULAR CONTRACTIONS (ICD-427.69) (ICD10-I49.3)  HYPERLIPIDEMIA (ICD-272.4) (ICD10-E78.5)  BACK PAIN, LUMBAR, CHRONIC (ICD-724.2) (ICD10-M54.5)  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)  DIABETES MELLITUS, TYPE II (ICD-250.00) (ICD10-E11.9)  LONG-TERM (CURRENT) USE OF STEROIDS (ICD-V58.65) (ICD10-Z79.51)  SKIN SAGGING DUE TO WEIGHT LOSS (ICD-757.9) (ICD10-Q84.9)  TRANSAMINASES, SERUM, ELEVATED (ICD-790.4) (ICD10-R74.0)  OBESITY, BMI 30-34.9, ADULT (ICD-278.00) (ICD10-E66.9)      DIABETES MELLITUS, TYPE  II, UNCONTROLLED (ICD-250.02) (ICD10-E11.65)      FATTY LIVER DISEASE (ICD-571.8) (ICD10-K76.0)  SCLERODERMA, 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)  HYPOTHYROIDISM (ICD-244.9) (ICD10-E03.9)  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) (ICD10-D  80.8)  SPINAL STENOSIS, 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)  HEPATITIS C EXPOSURE (HCV RNA NEGATIVE, 01/2014) (ICD-V02.62) (ICD10-Z20.5)  PAIN IN JOINT, HAND (ICD-719.44) (ICD10-M79.643)  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)  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)  CARPAL TUNNEL (ICD-354.0) (ICD10-G56.00)  OSTEOARTHRITIS (ICD-715.09) (ICD10-M15.9)  S/P ROTATOR CUFF SURGERY 12/2004 - RT SHOULDER; 2008 LT (ICD-V45.89)  Family Hx of MELANOMA, FAMILY HX (ICD-V16.8) (ICD10-Z80.8)  FRACTURE OF OTHER SPEC SITE,  PATHOLOGIC - MULTIPLE (ICD-733.19)  CHOLECYSTECTOMY AND HERNIA REPAIR (ICD-V45.89)  VITAMIN D DEFICIENCY (ICD-268.9) (ICD10-E55.9)  COLONOSCOPY, NEXT 2020- SEE COMMENT (ICD-V76.51) (ICD10-Z01.89)  PROBLEM CHANGES:  Added new problem of ANEMIA, OTHER (ICD-285.8) (ICD10-D64.89) - Signed  Added new problem of SCREENING EXAM FOR BREAST CANCER (ICD-V76.10) (ICD10-Z  12.39) - Signed  .  [name]  MEDICATIONS:   PAST MEDICINES (prior to today's visit):  HYDROXYCHLOROQUINE SULFATE 200 MG ORAL TABLET (HYDROXYCHLOROQUINE SULFATE)   one tablet oral daily; Route: ORAL  JARDIANCE 10 MG ORAL TABLET (EMPAGLIFLOZIN) Take 1 tablet by mouth once dai  ly; Route: ORAL  ROSUVASTATIN 10MG  TABLETS (ROSUVASTATIN CALCIUM) TAKE 1 TABLET BY MOUTH EVE  RY DAY  LISINOPRIL 2.5MG  TABLETS (LISINOPRIL) TAKE 1 TABLET BY MOUTH ONCE DAILY  METFORMIN HCL ER 500 MG XR24H-TAB (METFORMIN HCL) TAKE 4 TABLETS BY MOUTH E  VERY MORNING  GLIPIZIDE ER 2.5 MG XR24H-TAB (GLIPIZIDE) TAKE 1 TABLET BY MOUTH DAILY  OMEPRAZOLE 20 MG ORAL CAPSULE DELAYED RELEASE (OMEPRAZOLE) Take one capsule   by mouth twice a day; Route: ORAL  IBUPROFEN 600 MG ORAL TABLET (IBUPROFEN) 1 tab by mouth TID as needed for  p  ain  BACLOFEN 10MG  TABLETS (BACLOFEN) TAKE 1/2 TABLET BY MOUTH EVERY DAY AS NEED  ED FOR BACK OR SPASMS  DICLOFENAC SODIUM 1 % TRANSDERMAL GEL (DICLOFENAC SODIUM) APP 2 GRAMS EXT A  A BID      FREESTYLE FREEDOM LITE w/Device KIT (BLOOD GLUCOSE MONITORING SUPPL) [place]  e as directed (ICD 10 E11.65)      FREESTYLE LITE BLOOD GLUCOSE STRIPS (GLUCOSE BLOOD) USE TO CHECK BLOOD   GLUCOSE THREE TIMES DAILY      FREESTYLE LANCETS (LANCETS) check fingerstick three times a day (ICD 10   E11.65)  TRAMADOL HCL 50 MG ORAL TABLET (TRAMADOL HCL) take one tablet daily as need  ed for pain; Route: ORAL  OXYCODONE HCL 5 MG ORAL TABLET (OXYCODONE HCL) Take one tablet daily as nee  ded for pain >8. Partial fill upon patient request.; Route: ORAL  CALCIUM CARBONATE-VITAMIN D 600-200 MG-UNIT ORAL TABLET (CALCIUM CARBONATE-  VITAMIN D) take one tablet twice a day; Route: ORAL  GABAPENTIN 300MG  CAPSULES (GABAPENTIN) TAKE 1 CAPSULE BY MOUTH EVERY NIGHT   AT BEDTIME AS NEEDED FOR PAIN  * BLOODWORK Please draw chemistry (sodium, potassium, magnesium, chloride,   bicarbonate, glucose, creatinine, BUN), CBC, TSH, lipids, HbA1c. Fax result   to [phone] Dr. Marlane Hatcher  PREDNISONE 10 MG ORAL TABLET (PREDNISONE) Take 3 tablets once a day; Route:   ORAL  FAMOTIDINE 20 MG ORAL TABLET (FAMOTIDINE) Take 1 tablet  at night as needed  .; Route: ORAL  BACTRIM 400-80 MG ORAL TABLET (SULFAMETHOXAZOLE-TRIMETHOPRIM) ; Route: ORAL  .  MEDICINE CHANGES:  Removed medication of IBUPROFEN 600 MG ORAL TABLET (IBUPROFEN) 1 tab by mou  th TID as needed for pain - Signed  Removed medication of BACTRIM 400-80 MG ORAL TABLET (SULFAMETHOXAZOLE-TRIME  THOPRIM); Route: ORAL - Signed  Added new medication of IRON 325 (65 FE) MG ORAL TABLET (FERROUS SULFATE) T  ake one tablet once a day; Route: ORAL - Signed  Medications Reviewed:  Done  .  [name]  ALLERGIES:   No Known Allergies  Allergies Reviewed:  Done  .  [name]  DIRECTIVES:   .  .  .  Vitals:   BP: 120 / 81 mmHg Ht: 62.5 in.   Wt: 152.2 lbs.  BMI (in-lb) 27.49  Temp: 98.9deg F.   Pulse Rate: 113 bpm O2 Sat: 97 %  .  [name]  Additional PE: Gen: well appearing middle aged female, no acute distress  CV: RRR, no murmurs  Pulm: CTAB, normal work of breathing  Abd: soft, nontender, nondistended, +BS  Ext: 1+ pitting edema to mid shins bilaterally, nontender calves, erythemat  ous group of papules over right ankle   .  [name]  Assessment /T/ Plan:   59 year old femal with history of scleroderma, IgG4 deficiency, diabetes, H  TN and HLD who presents for follow up visit and reports recent hospital adm  ission for GI bleed.   .  1. GI bleed/  anemia  Patient recently hospitalized for GI bleed, no active bleeding on EGD or co  lonoscopy but found to has nonbleeding erosive gastropathy, divericulosis a  nd internal hemorrhoids. Hgb at time of discharge from hospital 8.8, and re  cent check on 3/5 8.9 so has been stable. Initially tachycardic on vitals b  ut exam normal rate /R/90, BP normal. She is no longer having melana so unl  ikely has ongoing bleed.   - avoid NSAIDs  - repeat CBC at next visit 3/22 (patient counseled to arrive 1 hr early for   blood draw)  - has colonscopy scheduled, will try to get sooner appointment w/ GI for co  nsideration of capsule study if bleed recurs/ CBC does not improve  .  2. Edema  Patient experiencing new bilateral LE edema after recent d/c for GI bleed.   Notes that she did receive IVF while hospitalized. RLE U/s on 3/5 demonstra  ted evidence of remote hx DVT in femoral vein. Patient may have venous insu  fficiency causing edema.  - scheduled for TTE 3/11  --- f/u results of echo at next visit  - encouraged LE elevation and compression stockings  .  3. Health Maintenace  - due for mammogram  - scheduled for repeat colonoscopy 5/[name] w/ more aggressive prep per 2017   endoscopy report  - last PAP 01/2019 nml, neg HPV  - f/u 3/22 for GI bleed  .  .  .  * Resident Signature (.sign):  ......................................Marland KitchenEllin Goodie MD -JN 6A-  March  9, [name] 5:17 PM  .  .  .  ORDERS:  RTC in 4 weeks [RTC-028]  Est Level 4 (MDM or 30-39 min) [CPT-99214]  CBC/DIFF (with Plt) [WBC] [CPT-85025]  Mammo Screening [CPT-77057]  .  [name]  Vital Signs   Height: 62.5 inches  Weight: 152.2 pounds  Temperature: 98.9 degrees  F   Pulse rate: 113  O2 Sat: 97  Blood Pressure #1: 120/81 mm Hg  Blood Pressure #2: 120/81 mm Hg  Body Mass Index: 27.49  .  .  **Preceptor Note**  Resident: Ellin Goodie)  Problem Follow-up  Other Prob(s): d/c after GI bleed  anamia  edeam legs  .  admitted t OSH  .  [name]  Subjective   Patient is a 60 Years Old Female who presents to clinic got 1 unit of blood   I discussed the patient with Dr. Tomasa Rand Chanetta Marshall) in a routine precepting   encounter.  I also personally interviewed and examined the patient.  I agr  ee with the patient's diagnosis and management.   [name]  [name]  Seen On Team (Floor):  6A  .  .  .  Patient Care Plan  .  .  .  Immunization Worksheet 2019 (rev 05/16/2018)   .  .  .  .  .  .  .  .  .  .  .  .  .  Follow-up With:   .  ]  .  [name]  Electronically Signed by Para March, MD on 03/09/[name] at 6:20 PM  ________________________________________________________________________

## 2019-06-12 ENCOUNTER — Ambulatory Visit

## 2019-06-12 ENCOUNTER — Ambulatory Visit: Admit: 2019-06-12

## 2019-06-13 ENCOUNTER — Ambulatory Visit

## 2019-06-13 ENCOUNTER — Ambulatory Visit: Admitting: Hand Surgery

## 2019-06-13 ENCOUNTER — Ambulatory Visit: Admitting: Physician Assistant

## 2019-06-13 ENCOUNTER — Ambulatory Visit: Admitting: Sports Medicine

## 2019-06-13 ENCOUNTER — Ambulatory Visit: Admitting: Surgical

## 2019-06-13 ENCOUNTER — Ambulatory Visit: Admit: 2019-06-13

## 2019-06-13 NOTE — Progress Notes (Signed)
 .  Progress Notes  .  Patient: [name], [name] [facility]  Provider: Herbert Deaner    .  DOB: [birth year] Age: 59 Y Sex: Female  Supervising Provider:: Yetta Numbers, MD  Date: 06/13/2019  .  PCP: Ruffin Frederick    Date: 06/13/2019  .  --------------------------------------------------------------------------------  .  REASON FOR APPOINTMENT  .  1. Left shoulder instability.  [name]  HISTORY OF PRESENT ILLNESS  .  GENERAL:  Follow-up. The patient is a 59 year old  female who is status post a left reverse total shoulder  arthroplasty in 03/2016. She was last seen in November by Dr.  [name]. She has a sensation of left shoulder instability that  occurs on a daily basis at random. She associates it with her  elbow being at a 90-degree angle. It may occur at rest or when  she is driving or doing other activities. It will sometimes occur  when she reaches for something. It feels as though her shoulder  is sliding out of place. She has never fully dislocated. She does  not have pain. She describes it as an uncomfortable feeling and  somewhat nauseating. She underwent a CT scan with metal  subtraction that was done at [facility].  Unfortunately, she does not have a disc with the images. In the  interim since her last visit, she has had hamstring repair by Dr.  Leandro Reasoner. She was also hospitalized with a GI bleed. The etiology  of this is still unknown and she is currently undergoing workup  for this. She has diffuse pain, swelling and stiffness in her  bilateral hands with history of scleroderma Her current  medications are not helping her hands.  .  CURRENT MEDICATIONS  .  Taking Acetaminophen 500 MG Tablet 2 tablets as needed Orally  every 8 hrs  Taking Baclofen 10 MG Tablet 1/2 tab Orally prn, Notes: prn  Taking Bactrim 400-80 MG Tablet 1 tablet Orally Once a day  Taking Calcium Carbonate-Vitamin D 600-200 MG-UNIT Capsule 1  capsule with a meal Orally Twice a day  Taking Famotidine 40 MG Tablet 1 tablet at bedtime  Orally Once a  day, Notes: prn  Taking Gammagard - Solution as directed Injection once every 4  weeks, Notes: every 4 weeks  Taking GaviLyte-C 240 GM Solution Reconstituted (If unavailable,  may dispense Gavilyte-Nper MD) 4000 ml Orally Please follow [facility]  instructions  Taking GlipiZIDE XL 5 MG Tablet Extended Release 24 Hour 1 tablet  Orally Once a day  Taking Glucophage XR 500 mg Tablet Extended Release 24 Hour 4  tablets Orally Once a day  Taking Hydroxychloroquine Sulfate 200 MG Tablet 1 tablet with  food or milk Orally Once a day  Taking Ibuprofen 600 MG Tablet 1 tablet with food or milk as  needed Orally Three times a day  Taking Jardiance 10 MG Tablet 1 tablet Orally Once a day  Taking Lidocaine 5 % Patch 1 patch remove after 12 hours  Externally Once a day  Taking Lisinopril 2.5 MG Tablet 1 tablet Orally Once a day  Taking Lovenox 30 MG/0.3ML Solution 0.3 ml Subcutaneous every 12  hrs  Taking Lovenox 40 MG/0.4ML Solution 0.4 ml Subcutaneous Once a  day  Taking Omeprazole 40 mg Capsule Delayed Release 1 capsule Orally  Once a day  Taking Oxycodone HCl 5 MG Tablet 1 tablet as needed Orally every  4-6 hrs  Taking PredniSONE 10 MG Tablet 2-3 tablet Orally Once a day  Taking Rituxan 500 MG/50ML Solution  as directed Intravenous ,  Notes: Last dose 11/29/18  Taking Rosuvastatin Calcium 10 mg Tablet 1 tablet Orally Once a  day  Taking Tramadol HCl 50 MG Tablet 1 tablet Orally every 6 hrs,  Notes: PRN - rarely takes  Taking Vitamin B Complex - Tablet 1 tablet Orally once daily  Taking Vitamin D 25 MCG (1000 UT) Tablet 1 tablet Orally Once a  day  Taking Voltaren 1 % Gel 2 grams Transdermal twice daily  Medication List reviewed and reconciled with the patient  .  PAST MEDICAL HISTORY  .  Scleroderma - CREST dx 2007  IgG4 deficiency s/p IVIG 2010 ----single infusion given  preventively after week of bilateral knee replacements at [facility]  2010  Fx left wrist in 1994  Blood clot at LUE in 2006 on a short course of  Coumadin  Bilateral carpal tunnel syndrome s/p Lt carpal tunnel release and  steroids injection right--  Bone spur left foot  Scoliosis  Spinal stenosis s/p steroids injection  s/p bilateral knee replacements for valgus /arthritic  complications, performed by Dr. Katrinka Blazing 2010--- never infected, but  packed with antibiotics with surgery;  Right rotator cuff repairs x 4, complicated by repeat tears,  infection, placement of anchor material  Elevated liver function tests  Diabetes  Seronegative RA  .  ALLERGIES  .  N.K.D.A.  .  SURGICAL HISTORY  .  Right rotator cuff repair, with infected hardware that had to be  removed 2006  Repeat right shoulder surgery, also which became infected. 2007  Left rotator cuff repair 2008  bilateral knee replacements 2009  Left arthroscopic carpal tunnel release 01/2011  ORIF Left 4th metatarsal bone  Left Ulna shortening 1994  Knee replacement 2010  Hand surgery 2013, 2015  remove gallbladder/hernia 1990  Plate left foot 3rd metatarsal 2008  Left reverse TSA 03/07/16  .  FAMILY HISTORY  .  Mother: deceased 14 yrs, lung cancer, hyperthyroidism, diagnosed  with Other malignant neoplasm of unspecified site  Father: deceased 79s yrs, heart attack, Unspecified heart disease  3 brother(s) .  FH of arthritis and scleroderma\nFather deceased from MI\nMother  deceased age 30 with lung cancer \\nBrother  with scleroderma \/  copd.  .  SOCIAL HISTORY  .  .  Tobacco  history: Never smoked.  .  Marital Status Single.  .  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  .  Alcohol Former daily EtOH use in 20s.  .  Nonsmoker.Lives with longstanding boyfriend.  [name]  HOSPITALIZATION/MAJOR DIAGNOSTIC PROCEDURE  .  as above  .  REVIEW OF SYSTEMS  .  ORT:  .  Eyes    No . Ear, Nose Throat    No . Digestion, Stomach, Bowel     No . Bladder Problems    No . Bleeding Problems    No .  Numbness/Tingling    No . Anxiety/Depression    No .  Fever/Chills/Fatigue    No . Chest Pain/Tightness/Palpitations     No . Skin  Rash    No . Dental Problems    No . Joint/Muscle  Pain/Cramps    Yes . Blackout/Fainting    No . Other    No .  .  VITAL SIGNS  .  Pain scale 7, Ht-in 62, Ht-cm 157.48.  [name]  EXAMINATION  .  General Examination: On examination today, she is a  well-appearing pleasant female in no apparent distress. On  examination of her left shoulder, she has active forward flexion  of  165-170 degrees. In adduction, she has external rotation of at  least 40 degrees. The upper extremity is warm and well perfused.  Her arm is only slightly increased in length on the left in  comparison to the right.  .  ASSESSMENTS  .  Status post replacement of left shoulder joint - [phone]  (Primary)  .  TREATMENT  .  Others  Notes: The patient was seen today with Dr. [name]. She will  obtain a copy of her CT scan on a disc. She will drop this off  late next week for Dr. [name] to review. He will speak with her  further following review of the CT scan.  .  FOLLOW UP  .  Will obtain CT on disc  .  [name]  Appointment Provider: Herbert Deaner, [facility]  .  Electronically signed by Yetta Numbers , MD on  07/10/2019 at 07:22 AM EDT  .  CONFIRMATORY SIGN OFF  .  [name]  Document electronically signed by Herbert Deaner    .

## 2019-06-13 NOTE — Progress Notes (Signed)
 .  Progress Notes  .  Patient: [name], [name] [facility]  Provider: Melburn Popper    .  DOB: [birth year] Age: 59 Y Sex: Female  Supervising Provider:: Rhoderick Moody  Date: 06/13/2019  .  PCP: Ellin Goodie  MD  Date: 06/13/2019  .  --------------------------------------------------------------------------------  .  REASON FOR APPOINTMENT  .  1. s/p left proximal hamstring repair DOS 04/07/2019  .  2. R hip pain  .  HISTORY OF PRESENT ILLNESS  .  General:  The patient is a 59 year-old female who  presents here today s/p Left proximal hamstring repair on  04/07/2019. The patient is now just shy of 10 weeks out from the  procedure and doing very well. She feels she has made great  progress with PT in regards to her ROM and strength. She denies  pain in the LLE. She says hte incision site has healed well  without evidence of infection. Denies fevers/chills. No other  concerns in regards to her LLE. Her main concern today is the  worsening pain localized to her R hip and lateral thigh,  radiating to her lower back. She describes a history of troch  bursitis for which she had an in-office CSI with Dr. [name]  about a year ago with good relief. She says she has been doing  her stretches and PT exercises, however now her symptoms are the  most severe they have ever been. This is now affecting her  ability to ambulate comfortably. The pain is no longer  intermittent and often worsens throughout the day. She denies  numbness and tingling. No other concerns today. No recent injury.  .  CURRENT MEDICATIONS  .  Taking Acetaminophen 500 MG Tablet 2 tablets as needed Orally  every 8 hrs  Taking Baclofen 10 MG Tablet 1/2 tab Orally prn, Notes: prn  Taking Bactrim 400-80 MG Tablet 1 tablet Orally Once a day  Taking Calcium Carbonate-Vitamin D 600-200 MG-UNIT Capsule 1  capsule with a meal Orally Twice a day  Taking Famotidine 40 MG Tablet 1 tablet at bedtime Orally Once a  day, Notes: prn  Taking Gammagard - Solution as directed  Injection once every 4  weeks, Notes: every 4 weeks  Taking GaviLyte-C 240 GM Solution Reconstituted (If unavailable,  may dispense Gavilyte-Nper MD) 4000 ml Orally Please follow [place]  instructions  Taking GlipiZIDE XL 5 MG Tablet Extended Release 24 Hour 1 tablet  Orally Once a day  Taking Glucophage XR 500 mg Tablet Extended Release 24 Hour 4  tablets Orally Once a day  Taking Hydroxychloroquine Sulfate 200 MG Tablet 1 tablet with  food or milk Orally Once a day  Taking Ibuprofen 600 MG Tablet 1 tablet with food or milk as  needed Orally Three times a day  Taking Jardiance 10 MG Tablet 1 tablet Orally Once a day  Taking Lidocaine 5 % Patch 1 patch remove after 12 hours  Externally Once a day  Taking Lisinopril 2.5 MG Tablet 1 tablet Orally Once a day  Taking Lovenox 30 MG/0.3ML Solution 0.3 ml Subcutaneous every 12  hrs  Taking Lovenox 40 MG/0.4ML Solution 0.4 ml Subcutaneous Once a  day  Taking Omeprazole 40 mg Capsule Delayed Release 1 capsule Orally  Once a day  Taking Oxycodone HCl 5 MG Tablet 1 tablet as needed Orally every  4-6 hrs  Taking PredniSONE 10 MG Tablet 2-3 tablet Orally Once a day  Taking Rituxan 500 MG/50ML Solution as directed Intravenous ,  Notes:  Last dose 11/29/18  Taking Rosuvastatin Calcium 10 mg Tablet 1 tablet Orally Once a  day  Taking Tramadol HCl 50 MG Tablet 1 tablet Orally every 6 hrs,  Notes: PRN - rarely takes  Taking Vitamin B Complex - Tablet 1 tablet Orally once daily  Taking Vitamin D 25 MCG (1000 UT) Tablet 1 tablet Orally Once a  day  Taking Voltaren 1 % Gel 2 grams Transdermal twice daily  .  PAST MEDICAL HISTORY  .  Scleroderma - CREST dx 2007  IgG4 deficiency s/p IVIG 2010 ----single infusion given  preventively after week of bilateral knee replacements at [facility]  2010  Fx left wrist in 1994  Blood clot at LUE in 2006 on a short course of Coumadin  Bilateral carpal tunnel syndrome s/p Lt carpal tunnel release and  steroids injection right--  Bone spur left  foot  Scoliosis  Spinal stenosis s/p steroids injection  s/p bilateral knee replacements for valgus /arthritic  complications, performed by Dr. Katrinka Blazing 2010--- never infected, but  packed with antibiotics with surgery;  Right rotator cuff repairs x 4, complicated by repeat tears,  infection, placement of anchor material  Elevated liver function tests  Diabetes  Seronegative RA  R hip pain  L proximal hamstring rupture  .  ALLERGIES  .  N.K.D.A.  .  SURGICAL HISTORY  .  Right rotator cuff repair, with infected hardware that had to be  removed 2006  Repeat right shoulder surgery, also which became infected. 2007  Left rotator cuff repair 2008  bilateral knee replacements 2009  Left arthroscopic carpal tunnel release 01/2011  ORIF Left 4th metatarsal bone  Left Ulna shortening 1994  Knee replacement 2010  Hand surgery 2013, 2015  remove gallbladder/hernia 1990  Plate left foot 3rd metatarsal 2008  L proximal hamstring repair 04/07/2019  .  FAMILY HISTORY  .  Mother: deceased 65 yrs, lung cancer, hyperthyroidism, diagnosed  with Other malignant neoplasm of unspecified site  Father: deceased 43s yrs, heart attack, Unspecified heart disease  3 brother(s) .  FH of arthritis and scleroderma\nFather deceased from MI\nMother  deceased age 52 with lung cancer \\nBrother  with scleroderma \/  copd.  .  SOCIAL HISTORY  .  .  Tobacco  history: Never smoked.  .  .  Marital Status  Single  .  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  .  .  Alcohol  Former daily EtOH use in 20s  .  Nonsmoker.Lives with longstanding boyfriend.  [name]  HOSPITALIZATION/MAJOR DIAGNOSTIC PROCEDURE  .  as above  .  REVIEW OF SYSTEMS  .  ORT:  .  Eyes    No . Ear, Nose Throat    No . Digestion, Stomach, Bowel     No . Bladder Problems    No . Bleeding Problems    No .  Numbness/Tingling    No . Anxiety/Depression    No .  Fever/Chills/Fatigue    No . Chest Pain/Tightness/Palpitations     No . Skin Rash    No . Dental Problems    No .  Joint/Muscle  Pain/Cramps    Yes . Blackout/Fainting    No . Other    No .  .  PHYSICAL EXAMINATION  .  On examination today, the patient is a well-appearing, pleasant  59 year-old female in no acute distress. She ambulates with a  trendelenberg gait. On examination of the Left lower extremity,  there is a well-healed transverse incision at the gluteal  crease  with no surrounding erythema, warmth, fluctuance, or purulent  discharge. No other signs of infection. No tenderness at the  incision site. ROM at the knee and hip full and symmetric. Good  symmetric strength with knee flexion and extension against  resistance. Negative hamstring tightness. Negative SLR. Good  mechanics and balance with deep squat. Sensation intact distally,  LLE is warm and well perfused with palpable pedal pulses. On exam  of the RLE, skin intact, no swelling, warmth, or erythema. No  other signs of infection. Full symmetric ROM at the hip and knee.  She is tender over the trochanteric bursa and mid-IT band. No  tenderness over the distal IT band. Negative hamstring tightness.  Negative SLR. Sensation intact distally. RLE is WWP with palpable  pedal pulses.  .  ASSESSMENTS  .  Tear of left hamstring, subsequent encounter - S76.312D (Primary)  .  Right hip pain - M25.551  .  Right thigh pain - M79.651  .  TREATMENT  .  Tear of left hamstring, subsequent encounter  Notes: Patient was seen and evaluated with Dr. Leandro Reasoner today.  Allysa is doing well just shy of 10 weeks post-op from her L  proximal hamstring repair. In regards to her RLE, exam today is  most suggestive of troch bursitis, IT band syndrome, vs  neuropathic pain. Although she had good relief from troch bursa  CSI in the past, we do not recommend repeating this injection  today due to her risk of gluteal rupture. We recommend she  continue working with her physical therapist to include graston  therapy for her RLE. PT Rx provided. We recommend she undergo an  evaluation with Dr.  [name] for consideration of TPI. The nature  and progression of this condition was discussed. All of her  questions were answered and she agrees to the plan. She will  follow up with [place] in 3 months.  .  FOLLOW UP  .  3 Months  .  [name]  Appointment Provider: MADISON CREIGHTON  .  Electronically signed by Chauncey Cruel , MD on  06/18/2019 at 11:27 AM EDT  .  CONFIRMATORY SIGN OFF  .  [name]  Document electronically signed by Roney Mans, MADISON    .

## 2019-06-13 NOTE — Telephone Encounter (Signed)
 RESPONSE/ORDERS:    Spoke with patient to explain results of TTE and that patient will referred to cardiology. Antionio, I put in a cardiology referral, can you please make sure this appointment gets set up? Thanks! 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-Q21.1)  SCREENING EXAM FOR BREAST CANCER (ICD-V76.10) (ICD10-Z12.39)  ANEMIA, OTHER (ICD-285.8) (ICD10-D64.89)  LOWER EXTREMITY EDEMA (ICD-782.3) (ICD10-R60.0)  IRREGULAR HEART BEATS (ICD-427.9) (ICD10-I49.9)  GI BLEED (ICD-578.9) (YVD73-A25.2)  SCLERODERMA (ICD-710.1) (ICD10-M34.9)  SCREENING FOR CERVICAL CANCER (ICD-V76.2) (OHC09-Z98.4)  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) (ICD10-M47.812)  NECK PAIN (ICD-723.1) (ICD10-M54.2)  STEROID USE, LONG TERM (ICD-V58.65) (ICD10-Z79.52)  PREMATURE VENTRICULAR CONTRACTIONS (ICD-427.69) (ICD10-I49.3)  HYPERLIPIDEMIA (ICD-272.4) (ICD10-E78.5)  BACK PAIN, LUMBAR, CHRONIC (ICD-724.2) (ICD10-M54.5)  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)  DIABETES MELLITUS, TYPE II (ICD-250.00) (ICD10-E11.9)  LONG-TERM (CURRENT) USE OF STEROIDS (ICD-V58.65) (ICD10-Z79.51)  SKIN SAGGING DUE TO WEIGHT LOSS (ICD-757.9) (ICD10-Q84.9)  TRANSAMINASES, SERUM, ELEVATED (ICD-790.4) (ICD10-R74.0)  OBESITY, BMI 30-34.9, ADULT (ICD-278.00) (ICD10-E66.9)      DIABETES MELLITUS, TYPE II, UNCONTROLLED (ICD-250.02) (ICD10-E11.65)      FATTY LIVER DISEASE (ICD-571.8) (ICD10-K76.0)  SCLERODERMA, 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)  HYPOTHYROIDISM (ICD-244.9) (ICD10-E03.9)  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) (ICD10-D80.8)  SPINAL STENOSIS, 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)  HEPATITIS C EXPOSURE (HCV RNA NEGATIVE,  01/2014) (ICD-V02.62) (ICD10-Z20.5)  PAIN IN JOINT, HAND (ICD-719.44) (ICD10-M79.643)  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)  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)  CARPAL TUNNEL (ICD-354.0) (ICD10-G56.00)  OSTEOARTHRITIS (ICD-715.09) (ICD10-M15.9)  S/P ROTATOR CUFF SURGERY 12/2004 - RT SHOULDER; 2008 LT (ICD-V45.89)  Family Hx of MELANOMA, FAMILY HX (ICD-V16.8) (ICD10-Z80.8)  FRACTURE OF OTHER SPEC SITE,  PATHOLOGIC - MULTIPLE (ICD-733.19)  CHOLECYSTECTOMY AND HERNIA REPAIR (ICD-V45.89)  VITAMIN D DEFICIENCY (ICD-268.9) (ICD10-E55.9)  COLONOSCOPY, NEXT 2020- SEE COMMENT (ICD-V76.51) (ICD10-Z01.89)    Meds (prior to this call):   HYDROXYCHLOROQUINE SULFATE 200 MG ORAL TABLET (HYDROXYCHLOROQUINE SULFATE) one tablet oral daily; Route: ORAL  JARDIANCE 10 MG ORAL TABLET (EMPAGLIFLOZIN) Take 1 tablet by mouth once daily; Route: ORAL  ROSUVASTATIN 10MG  TABLETS (ROSUVASTATIN CALCIUM) TAKE 1 TABLET BY MOUTH EVERY DAY  LISINOPRIL 2.5MG  TABLETS (LISINOPRIL) TAKE 1 TABLET BY MOUTH ONCE DAILY  METFORMIN HCL ER 500 MG XR24H-TAB (METFORMIN HCL) TAKE 4 TABLETS BY MOUTH EVERY MORNING  GLIPIZIDE ER 2.5 MG XR24H-TAB (GLIPIZIDE) TAKE 1 TABLET BY MOUTH DAILY  OMEPRAZOLE 20 MG ORAL CAPSULE DELAYED RELEASE (OMEPRAZOLE) Take one capsule by mouth twice a day; Route: ORAL  BACLOFEN 10MG  TABLETS (BACLOFEN) TAKE 1/2 TABLET BY MOUTH EVERY DAY AS NEEDED FOR BACK OR SPASMS  DICLOFENAC SODIUM 1 % TRANSDERMAL GEL (DICLOFENAC SODIUM) APP 2 GRAMS EXT AA BID      FREESTYLE FREEDOM LITE w/Device KIT (BLOOD GLUCOSE MONITORING SUPPL) use as directed (ICD 10 E11.65)      FREESTYLE LITE BLOOD GLUCOSE STRIPS (GLUCOSE BLOOD) USE TO CHECK BLOOD GLUCOSE THREE TIMES DAILY      FREESTYLE LANCETS (LANCETS) check fingerstick three times a day (ICD 10 E11.65)  TRAMADOL HCL 50 MG ORAL TABLET (TRAMADOL HCL) take one tablet daily as needed for pain; Route: ORAL  OXYCODONE HCL 5 MG ORAL TABLET (OXYCODONE HCL) Take  one tablet daily as needed for  pain >8. Partial fill upon patient request.; Route: ORAL  CALCIUM CARBONATE-VITAMIN D 600-200 MG-UNIT ORAL TABLET (CALCIUM CARBONATE-VITAMIN D) take one tablet twice a day; Route: ORAL  GABAPENTIN 300MG  CAPSULES (GABAPENTIN) TAKE 1 CAPSULE BY MOUTH EVERY NIGHT AT BEDTIME AS NEEDED FOR PAIN  * BLOODWORK Please draw chemistry (sodium, potassium, magnesium, chloride, bicarbonate, glucose, creatinine, BUN), CBC, TSH, lipids, HbA1c. Fax result to [phone] Dr. Marlane Hatcher  PREDNISONE 10 MG ORAL TABLET (PREDNISONE) Take 3 tablets once a day; Route: ORAL  FAMOTIDINE 20 MG ORAL TABLET (FAMOTIDINE) Take 1 tablet  at night as needed.; Route: ORAL  IRON 325 (65 FE) MG ORAL TABLET (FERROUS SULFATE) Take one tablet once a day; Route: ORAL            Created By [name] -JN 6A- on 06/13/2019 at 12:59 PM    Electronically Signed By [name] -JN 6A- on 06/14/2019 at 03:32 PM

## 2019-06-13 NOTE — Progress Notes (Signed)
 [name] [name], [name] [name] **DOB:** 10/29/1960 (59 yo F) **Acc No.** [id number] **DOS:**  06/13/2019    ---       [name] [name], [name] [name]**    ------    59 Y old Female, DOB: April 01, 1961, External MRN: [id number]    Account Number: [id number]    [address] [place], [place]    Home: [phone]    Guarantor: [name] [name] Insurance: [id number] NHP PPO    PCP: [name] Goodie, MD Referring: [name] Goodie, MD External Visit ID:  [id number]    [facility]        * * *    06/13/2019  **Appointment Provider:** Malika Demario **CHN#:** [id number]    ------     **Supervising Provider:** MATTHEW Charlena Cross    ---       **Current Medications**    ---    Taking    * Acetaminophen 500 MG Tablet 2 tablets as needed Orally every 8 hrs    ---    * Baclofen 10 MG Tablet 1/2 tab Orally prn, Notes: prn    ---    * Bactrim 400-80 MG Tablet 1 tablet Orally Once a day    ---    * Calcium Carbonate-Vitamin D 600-200 MG-UNIT Capsule 1 capsule with a meal Orally Twice a day    ---    * Famotidine 40 MG Tablet 1 tablet at bedtime Orally Once a day, Notes: prn    ---    * Gammagard - Solution as directed Injection once every 4 weeks, Notes: every 4 weeks    ---    * GaviLyte-C 240 GM Solution Reconstituted (If unavailable, may dispense Gavilyte-Nper MD) 4000 ml Orally Please follow [place] instructions    ---    * GlipiZIDE XL 5 MG Tablet Extended Release 24 Hour 1 tablet Orally Once a day    ---    * Glucophage XR 500 mg Tablet Extended Release 24 Hour 4 tablets Orally Once a day    ---    * Hydroxychloroquine Sulfate 200 MG Tablet 1 tablet with food or milk Orally Once a day    ---    * Ibuprofen 600 MG Tablet 1 tablet with food or milk as needed Orally Three times a day    ---    * Jardiance 10 MG Tablet 1 tablet Orally Once a day    ---    * Lidocaine 5 % Patch 1 patch remove after 12 hours Externally Once a day    ---    * Lisinopril 2.5 MG Tablet 1 tablet Orally Once a day    ---    * Lovenox 30 MG/0.3ML  Solution 0.3 ml Subcutaneous every 12 hrs    ---    * Lovenox 40 MG/0.4ML Solution 0.4 ml Subcutaneous Once a day    ---    * Omeprazole 40 mg Capsule Delayed Release 1 capsule Orally Once a day    ---    * Oxycodone HCl 5 MG Tablet 1 tablet as needed Orally every 4-6 hrs    ---    * PredniSONE 10 MG Tablet 2-3 tablet Orally Once a day    ---    * Rituxan 500 MG/50ML Solution as directed Intravenous , Notes: Last dose 11/29/18    ---    * Rosuvastatin Calcium 10 mg Tablet 1 tablet Orally Once a day    ---    * Tramadol HCl 50 MG Tablet  1 tablet Orally every 6 hrs, Notes: PRN - rarely takes    ---    * Vitamin B Complex - Tablet 1 tablet Orally once daily    ---    * Vitamin D 25 MCG (1000 UT) Tablet 1 tablet Orally Once a day    ---    * Voltaren 1 % Gel 2 grams Transdermal twice daily    ---     Past Medical History    ---      Scleroderma - CREST dx 2007.        ---    IgG4 deficiency s/p IVIG 2010 ----single infusion given preventively after  week of bilateral knee replacements at [facility] 2010.        ---    Fx left wrist in 1994.        ---    Blood clot at LUE in 2006 on a short course of Coumadin.        ---    Bilateral carpal tunnel syndrome s/p Lt carpal tunnel release and steroids  injection right--.        ---    Bone spur left foot.        ---    Scoliosis.        ---    Spinal stenosis s/p steroids injection.        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 Katrinka Blazing 2010--- never infected, but packed with antibiotics with  surgery;.        ---    Right rotator cuff repairs x 4, complicated by repeat tears, infection,  placement of anchor material.        ---    Elevated liver function tests.        ---    Diabetes.        ---    Seronegative RA.        ---    R hip pain.        ---    L proximal hamstring rupture.        ---      **Surgical History**    ---      Right rotator cuff repair, with infected hardware that had to be removed  2006    ---    Repeat  right shoulder surgery, also which became infected. 2007    ---    Left rotator cuff repair 2008    ---    bilateral knee replacements 2009    ---    Left arthroscopic carpal tunnel release 01/2011    ---    ORIF Left 4th metatarsal bone    ---    Left Ulna shortening 1994    ---    Knee replacement 2010    ---    Hand surgery 2013, 2015    ---    remove gallbladder/hernia 1990    ---    Plate left foot 3rd metatarsal 2008    ---    L proximal hamstring repair 04/07/2019    ---      **Family History**    ---      Mother: deceased 42 yrs, lung cancer, hyperthyroidism, diagnosed with Other  malignant neoplasm of unspecified site    ---    Father: deceased 28s yrs, heart attack, Unspecified heart disease    ---    3 brother(s) .    ---    FH of  arthritis and scleroderma\nFather deceased from MI\nMother deceased age  33 with lung cancer \\nBrother  with scleroderma \/ copd.    ---      **Social History**    ---    Tobacco history: Never smoked.    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    Alcohol    _Former daily EtOH use in 20s_  Nonsmoker.    Lives with longstanding boyfriend.    ---      **Allergies**    ---      N.K.D.A.    ---    [name]]      **Hospitalization/Major Diagnostic Procedure**    ---      as above    ---     **Review of Systems**    ---     _ORT_ :    Eyes No. Ear, Nose Throat No. Digestion, Stomach, Bowel No. Bladder Problems  No. Bleeding Problems No. Numbness/Tingling No. Anxiety/Depression No.  Fever/Chills/Fatigue No. Chest Pain/Tightness/Palpitations No. Skin Rash No.  Dental Problems No. Joint/Muscle Pain/Cramps Yes. Blackout/Fainting No. Other  No.          **Reason for Appointment**    ---      1\. s/p left proximal hamstring repair DOS 04/07/2019    ---    2\. R hip pain    ---      **History of Present Illness**    ---     _General_ :    The patient is a 59 year-old female who presents here today s/p Left proximal  hamstring  repair on 04/07/2019. The patient is now just shy of 10 weeks out  from the procedure and doing very well. She feels she has made great progress  with PT in regards to her ROM and strength. She denies pain in the LLE. She  says hte incision site has healed well without evidence of infection. Denies  fevers/chills. No other concerns in regards to her LLE. Her main concern today  is the worsening pain localized to her R hip and lateral thigh, radiating to  her lower back. She describes a history of troch bursitis for which she had an  in-office CSI with Dr. [name] about a year ago with good relief. She says she  has been doing her stretches and PT exercises, however now her symptoms are  the most severe they have ever been. This is now affecting her ability to  ambulate comfortably. The pain is no longer intermittent and often worsens  throughout the day. She denies numbness and tingling. No other concerns today.  No recent injury.      **Physical Examination**    ---    On examination today, the patient is a well-appearing, pleasant 59 year-old  female in no acute distress. She ambulates with a trendelenberg gait. On  examination of the Left lower extremity, there is a well-healed transverse  incision at the gluteal crease with no surrounding erythema, warmth,  fluctuance, or purulent discharge. No other signs of infection. No tenderness  at the incision site. ROM at the knee and hip full and symmetric. Good  symmetric strength with knee flexion and extension against resistance.  Negative hamstring tightness. Negative SLR. Good mechanics and balance with  deep squat. Sensation intact distally, LLE is warm and well perfused with  palpable pedal pulses.    On exam of the RLE, skin intact, no swelling, warmth, or erythema. No other  signs of infection.  Full symmetric ROM at the hip and knee. She is tender over  the trochanteric bursa and mid-IT band. No tenderness over the distal IT band.  Negative hamstring  tightness. Negative SLR. Sensation intact distally. RLE is  WWP with palpable pedal pulses.      **Assessments**    ---    1\. Tear of left hamstring, subsequent encounter - S76.312D (Primary)    ---    2\. Right hip pain - M25.551    ---    3\. Right thigh pain - M79.651    ---      **Treatment**    ---      **1\. Tear of left hamstring, subsequent encounter**    Notes: Patient was seen and evaluated with Dr. Leandro Reasoner today. Rella is doing  well just shy of 10 weeks post-op from her L proximal hamstring repair. In  regards to her RLE, exam today is most suggestive of troch bursitis, IT band  syndrome, vs neuropathic pain. Although she had good relief from troch bursa  CSI in the past, we do not recommend repeating this injection today due to her  risk of gluteal rupture. We recommend she continue working with her physical  therapist to include graston therapy for her RLE. PT Rx provided. We recommend  she undergo an evaluation with Dr. [name] for consideration of TPI. The nature  and progression of this condition was discussed. All of her questions were  answered and she agrees to the plan. She will follow up with [place] in 3 months.    ---     **Follow Up**    ---    3 Months    **Appointment Provider:** Dhamar Gregory    Electronically signed by Chauncey Cruel , MD on 06/18/2019 at 11:27 AM EDT    Sign off status: Completed        * * *        [facility]    [address]    [place], [address]    [place], [place] [address]    Tel: [phone]    Fax: [phone]              * * *          Progress Note: Maryalyce Sanjuan 06/13/2019    ---    Note generated by eClinicalWorks EMR/PM Software ([email or web address])

## 2019-06-13 NOTE — Progress Notes (Signed)
 [name] [name], [name] [name] **DOB:** 01/13/1961 (59 yo F) **Acc No.** [id number] **DOS:**  06/13/2019    ---       [name] [name], [name] [name]**    ------    47 Y old Female, DOB: September 28, 1960, External MRN: [id number]    Account Number: [id number]    [address], [place], [address]    Home: [phone]    Guarantor: [name] [name] Insurance: [id number] NHP PPO    PCP: [name] [name] Referring: [name] [name]    Appointment Facility: [facility]        * * *    06/13/2019  **Appointment Provider:** [name] [name], [facility] **CHN#:** [id number]    ------     **Supervising Provider:** [name] Numbers, MD    ---       **Reason for Appointment**    ---      1\. Left shoulder instability.    ---      **History of Present Illness**    ---     _GENERAL_ :    Follow-up. The patient is a 59 year old female who is status post a left  reverse total shoulder arthroplasty in 03/2016. She was last seen in November  by [name] [name]. She has a sensation of left shoulder instability that occurs  on a daily basis at random. She associates it with her elbow being at a  90-degree angle. It may occur at rest or when she is driving or doing other  activities. It will sometimes occur when she reaches for something. It feels  as though her shoulder is sliding out of place. She has never fully  dislocated. She does not have pain. She describes it as an uncomfortable  feeling and somewhat nauseating. She underwent a CT scan with metal  subtraction that was done at [facility]. Unfortunately, she  does not have a disc with the images. In the interim since her last visit, she  has had hamstring repair by [name] [name]. She was also hospitalized with a GI  bleed. The etiology of this is still unknown and she is currently undergoing  workup for this. She has diffuse pain, swelling and stiffness in her bilateral  hands with history of scleroderma Her current medications are not helping her  hands.      **Current Medications**    ---     Taking    * Acetaminophen 500 MG Tablet 2 tablets as needed Orally every 8 hrs    ---    * Baclofen 10 MG Tablet 1/2 tab Orally prn, Notes: prn    ---    * Bactrim 400-80 MG Tablet 1 tablet Orally Once a day    ---    * Calcium Carbonate-Vitamin D 600-200 MG-UNIT Capsule 1 capsule with a meal Orally Twice a day    ---    * Famotidine 40 MG Tablet 1 tablet at bedtime Orally Once a day, Notes: prn    ---    * Gammagard - Solution as directed Injection once every 4 weeks, Notes: every 4 weeks    ---    * GaviLyte-C 240 GM Solution Reconstituted (If unavailable, may dispense Gavilyte-Nper MD) 4000 ml Orally Please follow [facility] instructions    ---    * GlipiZIDE XL 5 MG Tablet Extended Release 24 Hour 1 tablet Orally Once a day    ---    * Glucophage XR 500 mg Tablet Extended Release 24 Hour 4 tablets Orally  Once a day    ---    * Hydroxychloroquine Sulfate 200 MG Tablet 1 tablet with food or milk Orally Once a day    ---    * Ibuprofen 600 MG Tablet 1 tablet with food or milk as needed Orally Three times a day    ---    * Jardiance 10 MG Tablet 1 tablet Orally Once a day    ---    * Lidocaine 5 % Patch 1 patch remove after 12 hours Externally Once a day    ---    * Lisinopril 2.5 MG Tablet 1 tablet Orally Once a day    ---    * Lovenox 30 MG/0.3ML Solution 0.3 ml Subcutaneous every 12 hrs    ---    * Lovenox 40 MG/0.4ML Solution 0.4 ml Subcutaneous Once a day    ---    * Omeprazole 40 mg Capsule Delayed Release 1 capsule Orally Once a day    ---    * Oxycodone HCl 5 MG Tablet 1 tablet as needed Orally every 4-6 hrs    ---    * PredniSONE 10 MG Tablet 2-3 tablet Orally Once a day    ---    * Rituxan 500 MG/50ML Solution as directed Intravenous , Notes: Last dose 11/29/18    ---    * Rosuvastatin Calcium 10 mg Tablet 1 tablet Orally Once a day    ---    * Tramadol HCl 50 MG Tablet 1 tablet Orally every 6 hrs, Notes: PRN - rarely takes    ---    * Vitamin B Complex - Tablet 1 tablet Orally once daily    ---    * Vitamin  D 25 MCG (1000 UT) Tablet 1 tablet Orally Once a day    ---    * Voltaren 1 % Gel 2 grams Transdermal twice daily    ---    * Medication List reviewed and reconciled with the patient    ---      **Past Medical History**    ---      Scleroderma - CREST dx 2007.        ---    IgG4 deficiency s/p IVIG 2010 ----single infusion given preventively after  week of bilateral knee replacements at [facility] 2010.        ---    Fx left wrist in 1994.        ---    Blood clot at LUE in 2006 on a short course of Coumadin.        ---    Bilateral carpal tunnel syndrome s/p Lt carpal tunnel release and steroids  injection right--.        ---    Bone spur left foot.        ---    Scoliosis.        ---    Spinal stenosis s/p steroids injection.        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 Katrinka Blazing 2010--- never infected, but packed with antibiotics with  surgery;.        ---    Right rotator cuff repairs x 4, complicated by repeat tears, infection,  placement of anchor material.        ---    Elevated liver function tests.        ---    Diabetes.        ---  Seronegative RA.        ---      **Surgical History**    ---      Right rotator cuff repair, with infected hardware that had to be removed  2006    ---    Repeat right shoulder surgery, also which became infected. 2007    ---    Left rotator cuff repair 2008    ---    bilateral knee replacements 2009    ---    Left arthroscopic carpal tunnel release 01/2011    ---    ORIF Left 4th metatarsal bone    ---    Left Ulna shortening 1994    ---    Knee replacement 2010    ---    Hand surgery 2013, 2015    ---    remove gallbladder/hernia 1990    ---    Plate left foot 3rd metatarsal 2008    ---    Left reverse TSA 03/07/16    ---      **Family History**    ---      Mother: deceased 29 yrs, lung cancer, hyperthyroidism, diagnosed with Other  malignant neoplasm of unspecified site    ---    Father: deceased 64s yrs, heart attack,  Unspecified heart disease    ---    3 brother(s) .    ---    FH of arthritis and scleroderma\nFather deceased from MI\nMother deceased age  72 with lung cancer \\nBrother  with scleroderma \/ copd.    ---      **Social History**    ---    Tobacco history: Never smoked.    Marital Status  Single.    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    Alcohol  Former daily EtOH use in 20s.  Nonsmoker.    Lives with longstanding boyfriend.    ---      **Allergies**    ---      N.K.D.A.    ---      **Hospitalization/Major Diagnostic Procedure**    ---      as above    ---     **Review of Systems**    ---     _ORT_ :    Eyes No. Ear, Nose Throat No. Digestion, Stomach, Bowel No. Bladder Problems  No. Bleeding Problems No. Numbness/Tingling No. Anxiety/Depression No.  Fever/Chills/Fatigue No. Chest Pain/Tightness/Palpitations No. Skin Rash No.  Dental Problems No. Joint/Muscle Pain/Cramps Yes. Blackout/Fainting No. Other  No.         **Vital Signs**    ---    Pain scale 7, Ht-in 62, Ht-cm 157.48.      **Examination**    ---     _General Examination:_    On examination today, she is a well-appearing pleasant female in no apparent  distress. On examination of her left shoulder, she has active forward flexion  of 165-170 degrees. In adduction, she has external rotation of at least 40  degrees. The upper extremity is warm and well perfused. Her arm is only  slightly increased in length on the left in comparison to the right.          **Assessments**    ---    1\. Status post replacement of left shoulder joint - Z96.612 (Primary)    ---      **Treatment**    ---      **1\. Others**    Notes: The patient was seen today with [name] [name]. She will obtain a  copy of  her CT scan on a disc. She will drop this off late next week for [name] [name]  to review. He will speak with her further following review of the CT scan.    ---     **Follow Up**    ---    Will obtain CT on disc    **Appointment Provider:** [name] [name], [facility]     Electronically signed by [name] [name] , MD on 07/10/2019 at 07:22 AM EDT    Sign off status: Completed        * * *        [facility]    [address]    [place], [address]    [place], [place] [address]    Tel: [phone]    Fax: [phone]              * * *          Progress Note: [name] [name], [facility] 06/13/2019    ---    Note generated by eClinicalWorks EMR/PM Software ([email or web address])

## 2019-06-19 ENCOUNTER — Ambulatory Visit

## 2019-06-20 ENCOUNTER — Ambulatory Visit

## 2019-06-20 ENCOUNTER — Ambulatory Visit: Admitting: Rheumatology

## 2019-06-20 ENCOUNTER — Ambulatory Visit: Admit: 2019-06-20

## 2019-06-20 LAB — HX COAGULATION
HX INR PT: 0.9 (ref 0.9–1.3)
HX PROTHROMBIN TIME: 10.8 s (ref 9.7–14.0)
HX PTT: 27.6 s (ref 25.7–35.7)

## 2019-06-20 LAB — HX CHEM-LFT
HX ALANINE AMINOTRANSFERASE (ALT/SGPT): 91 IU/L — ABNORMAL HIGH (ref 0–54)
HX ALKALINE PHOSPHATASE (ALK): 302 IU/L — ABNORMAL HIGH (ref 40–130)
HX ASPARTATE AMINOTRANFERASE (AST/SGOT): 37 IU/L (ref 10–42)
HX BILIRUBIN, TOTAL: 0.5 mg/dL (ref 0.2–1.1)

## 2019-06-20 LAB — HX HEM-ROUTINE
HX BASO #: 0 10*3/uL (ref 0.0–0.2)
HX BASO: 0 %
HX EOSIN #: 0 10*3/uL (ref 0.0–0.5)
HX EOSIN: 0 %
HX HCT: 39.2 % (ref 32.0–45.0)
HX HGB: 11.6 g/dL (ref 11.0–15.0)
HX IMMATURE GRANULOCYTE#: 0.1 10*3/uL (ref 0.0–0.1)
HX IMMATURE GRANULOCYTE: 1 %
HX LYMPH #: 0.5 10*3/uL — ABNORMAL LOW (ref 1.0–4.0)
HX LYMPH: 5 %
HX MCH: 29.4 pg (ref 26.0–34.0)
HX MCHC: 29.6 g/dL — ABNORMAL LOW (ref 32.0–36.0)
HX MCV: 99.5 fL — ABNORMAL HIGH (ref 80.0–98.0)
HX MONO #: 0.2 10*3/uL (ref 0.2–0.8)
HX MONO: 2 %
HX MPV: 10.5 fL (ref 9.1–11.7)
HX NEUT #: 9.1 10*3/uL — ABNORMAL HIGH (ref 1.5–7.5)
HX NRBC #: 0 10*3/uL
HX NUCLEATED RBC: 0 %
HX PLT: 291 10*3/uL (ref 150–400)
HX RBC BLOOD COUNT: 3.94 M/uL (ref 3.70–5.00)
HX RDW: 15.3 % — ABNORMAL HIGH (ref 11.5–14.5)
HX SEG NEUT: 92 %
HX WBC: 9.8 10*3/uL (ref 4.0–11.0)

## 2019-06-20 LAB — HX CHEM-PANELS
HX ANION GAP: 8 (ref 3–14)
HX BLOOD UREA NITROGEN: 15 mg/dL (ref 6–24)
HX CHLORIDE (CL): 106 meq/L (ref 98–110)
HX CO2: 25 meq/L (ref 20–30)
HX CREATININE (CR): 0.75 mg/dL (ref 0.57–1.30)
HX GFR, AFRICAN AMERICAN: 103 mL/min/{1.73_m2}
HX GFR, NON-AFRICAN AMERICAN: 89 mL/min/{1.73_m2}
HX GLUCOSE: 194 mg/dL — ABNORMAL HIGH (ref 70–139)
HX POTASSIUM (K): 4.4 meq/L (ref 3.6–5.1)
HX SODIUM (NA): 139 meq/L (ref 135–145)

## 2019-06-20 LAB — HX HEM-MISC: HX SED RATE: 32 mm — ABNORMAL HIGH (ref 0–30)

## 2019-06-20 LAB — HX DIABETES: HX GLUCOSE: 194 mg/dL — ABNORMAL HIGH (ref 70–139)

## 2019-06-20 LAB — HX CHEM-OTHER: HX CALCIUM (CA): 9.6 mg/dL (ref 8.5–10.5)

## 2019-06-20 LAB — HX IMMUNOLOGY: HX C-REACTIVE PROTEIN - CRP: 1.46 mg/L (ref 0.00–7.48)

## 2019-06-20 NOTE — Progress Notes (Signed)
 [name] [name], [name] [name] **DOB:** [birth year] (59 yo F) **Acc No.** [id number] **DOS:**  06/20/2019    ---       [name] [name], [name] [name]**    ------    59 Y old Female, DOB: 12/15/1960, External MRN: [id number]    Account Number: [id number]    [address], [place], [place]    Home: [phone]    Guarantor: [name] Insurance: H96 NHP PPO    PCP: Ellin Goodie, MD Referring: Ellin Goodie, MD    Appointment Facility: Rheumatology, Allergy and Immunology        * * *    06/20/2019 Progress Notes: [name], MD **CHN#:** [phone]    ------    ---       **History of Present Illness**    ---     _GENERAL_ :    She continues to do very poorly. She was admitted to [facility] with severe anemia.  She underwent upper and lower endoscopy with no identified etiology for her  presumed GI bleed. She is due to get a capsule study, but is very confused  about her appointment and feels she has not gotten a great response from the  GI department. She has significant pain, stiffness and swelling in the hands,  not better since rituximab treatments. Her Raynaud continues to act up. she is  struggling to do ADLs. She also has significant trouble walking and overall  feels terrible since leaving the hospital. She has had some weight gain from  steroids but no other side effects. [place] but it is on [place] given her recent hospitalization. She has not  had any recent infections and she has been socially distant and wearing masks.  She got COVID vaccine.      **Current Medications**    ---    Taking    * Acetaminophen 500 MG Tablet 2 tablets as needed Orally every 8 hrs    ---    * Baclofen 10 MG Tablet 1/2 tab Orally prn, Notes: prn    ---    * Bactrim 400-80 MG Tablet 1 tablet Orally Once a day    ---    * Calcium Carbonate-Vitamin D 600-200 MG-UNIT Capsule 1 capsule with a meal Orally Twice a day    ---    * Famotidine 40 MG Tablet 1 tablet at bedtime Orally Once a  day, Notes: prn    ---    * Gammagard - Solution as directed Injection once every 4 weeks, Notes: every 4 weeks    ---    * GaviLyte-C 240 GM Solution Reconstituted (If unavailable, may dispense Gavilyte-Nper MD) 4000 ml Orally Please follow [place] instructions    ---    * GlipiZIDE XL 5 MG Tablet Extended Release 24 Hour 1 tablet Orally Once a day    ---    * Glucophage XR 500 mg Tablet Extended Release 24 Hour 4 tablets Orally Once a day    ---    * Hydroxychloroquine Sulfate 200 MG Tablet 1 tablet with food or milk Orally Once a day    ---    * Ibuprofen 600 MG Tablet 1 tablet with food or milk as needed Orally Three times a day    ---    * Jardiance 10 MG Tablet 1 tablet Orally Once a day    ---    * Lidocaine 5 % Patch 1  patch remove after 12 hours Externally Once a day    ---    * Lisinopril 2.5 MG Tablet 1 tablet Orally Once a day    ---    * Lovenox 30 MG/0.3ML Solution 0.3 ml Subcutaneous every 12 hrs    ---    * Lovenox 40 MG/0.4ML Solution 0.4 ml Subcutaneous Once a day    ---    * Omeprazole 40 mg Capsule Delayed Release 1 capsule Orally Once a day    ---    * Oxycodone HCl 5 MG Tablet 1 tablet as needed Orally every 4-6 hrs    ---    * PredniSONE 10 MG Tablet 2-3 tablet Orally Once a day    ---    * Rituxan 500 MG/50ML Solution as directed Intravenous , Notes: Last dose 11/29/18    ---    * Rosuvastatin Calcium 10 mg Tablet 1 tablet Orally Once a day    ---    * Tramadol HCl 50 MG Tablet 1 tablet Orally every 6 hrs, Notes: PRN - rarely takes    ---    * Vitamin B Complex - Tablet 1 tablet Orally once daily    ---    * Vitamin D 25 MCG (1000 UT) Tablet 1 tablet Orally Once a day    ---    * Voltaren 1 % Gel 2 grams Transdermal twice daily    ---    * Medication List reviewed and reconciled with the patient    ---      **Past Medical History**    ---      Scleroderma - CREST dx 2007.        ---    IgG4 deficiency s/p IVIG 2010 ----single infusion given preventively after  week of bilateral knee  replacements at [facility] 2010.        ---    Fx left wrist in 1994.        ---    Blood clot at LUE in 2006 on a short course of Coumadin.        ---    Bilateral carpal tunnel syndrome s/p Lt carpal tunnel release and steroids  injection right--.        ---    Bone spur left foot.        ---    Scoliosis.        ---    Spinal stenosis s/p steroids injection.        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 Katrinka Blazing 2010--- never infected, but packed with antibiotics with  surgery;.        ---    Right rotator cuff repairs x 4, complicated by repeat tears, infection,  placement of anchor material.        ---    Elevated liver function tests.        ---    Diabetes.        ---    Seronegative RA.        ---      **Social History**    ---    Tobacco history: Never smoked.    Marital Status  Single.    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    Alcohol  Former daily EtOH use in 20s.  Nonsmoker.    Lives with longstanding boyfriend.    ---      **Allergies**    ---  N.K.D.A.    ---      **Review of Systems**    ---     _Rheumatology_ :    General No fever, weight loss, swollen glands. Muscoskeletal +Joint pain,  +joint swelling, +morning stiffness . Eyes No vision loss, eye dry, red eye,  eye pain. Mouth No mouth sores. Cardiovascular ? raynaud, no chest pain ,  irregular heart beat, racing heart beat, leg swelling. Pulmonary No cough,  cough blood, shortness of breath. Skin No rash, photosensitivity. Lymphatics  No swollen glands, tender glands.         **Vital Signs**    ---    Pain scale 5, Ht-in 62, Wt-lbs 156, BMI 28.53, BP 132/80, HR 85, BSA 1.76, O2  99, Ht-cm 157.48, Wt-kg 70.76, Wt Change 9 lb.      **Examination**    ---     _Rheumatology:_    General Appearance:  Alert and oriented , No apparent distress .    Eyes:  No, scleral icterus, scleral erythema .    Pulm: Clear to auscultation bilaterally, no crackles/wheezes/rhonchi.    Abdomen Soft,  nondistended.    Skin: No rashes or nail changes.    Muskuloskeletal Elbows, shoulders, hips, knees have intact ROM with no  synovitis. Hands have synovitis in the 2-3 PIPs and MCPs bilaterally. Positive  MCP squeeze b/l. Heberden's node to R2 DIP.    Skin and Nails Minimal sclerodactyly, some cracking atthe fingertips, but no  ulcers.         **Assessments**    ---    1\. 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 Scleroderma - M34.9    ---    3\. 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 Long-term use of high-risk medication - Z79.899    ---     She continues to do poorly, and is now far enough out from rituximab dose  that it should be working. We discussed alternatives including  cyclophsophamide and mycophenolate. I prefer the latter but gave her reading  information on both. I have been hesitant to pursue these given her CVID  however the monthly IVIG infusions have kept her infection free. She has now  had multiple complications from long term steroid use including tendon  ruptures, muscle atrophy, and possible GI bleed. Therefore I think at this  point the risk of steroids outweighs the risk of other immunosuppressants.  However she is dealing with a couple other issues. She had an echo which  showed possible ASD. She is due to follow up with cardiology next week. She  also has the probable GI bleed and is due for a capsule procedure. I recommend  we hold on further treatment until she has these two procedures, in case  further intervention is planned. I will plan to see her back in 1 month right  after her GI follow-up is complete and we can plan accordingly. She will also  continue monthly IVIG infusions to treat CVID. She is at high risk for  infection with the treatments plus CVID, especially during the pandemic. I  encouraged her to maintain safe practices during the pandemic.    ---      **Treatment**    ---      **1\. Scleroderma**    _LAB: Basic  Metabolic Panel (BMP)_   Value Reference Range    ---------    Anion Gap 8  3 - 14 -  Calcium (Ca) 9.6  8.5 - 10.5 - mg/dL    ------------    Chloride (CL) 106  98 - 110 - mEq/L    ------------    CO2 25  20 - 30 - mEq/L    ------------    Creatinine (CR) 0.75  0.57 - 1.30 - mg/dL    ------------    Glucose 194 H 70 - 139 - mg/dL    ------------    Potassium (K) 4.4  3.6 - 5.1 - mEq/L    ------------    Sodium (NA) 139  135 - 145 - mEq/L    ------------    Blood Urea Nitrogen 15  6 - 24 - mg/dL    ------------    _LAB: Hepatic Function/Liver Function (LFTP)_    _LAB: ESR Adult Heme-Onc_    _LAB: PTT (PTT)_  Value Reference Range    ---------    PTT 27.6  25.7 - 35.7 - sec    _LAB: Manual Prothrombin Time_    _LAB: C-Reactive Protein (CRP)_  Value Reference Range    ---------    C-Reactive Protein - CRP (Ultra-Wide Range) 1.46  0.00 - 7.48 - mg/L    _LAB: CBC/DIFF with PLT (CBCWD)_  Value Reference Range    ---------    WBC 9.8  4.0 - 11.0 - K/uL    RBC 3.94  3.70 - 5.00 - M/uL    ------------    HGB 11.6  11.0 - 15.0 - g/dL    ------------    HCT 39.2  32.0 - 45.0 - %    ------------    MCV 99.5 H 80.0 - 98.0 - fL    ------------    MCH 29.4  26.0 - 34.0 - pg    ------------    MCHC 29.6 L 32.0 - 36.0 - g/dL    ------------    RDW 15.3 H 11.5 - 14.5 - %    ------------    PLT 291  150 - 400 - K/uL    ------------    MPV 10.5  9.1 - 11.7 - fL    ------------    SEG NEUT 92   \- %    ------------    LYMPH 5   \- %    ------------    MONO 2   \- %    ------------    EOS 0   \- %    ------------    BASO 0   \- %    ------------    NEUT # 9.1 H 1.5 - 7.5 - K/uL    ------------    LYMPH # 0.5 L 1.0 - 4.0 - K/uL    ------------    MONO # 0.2  0.2 - 0.8 - K/uL    ------------    EOSIN # 0.0  0.0 - 0.5 - K/uL    ------------    BASO # 0.0   0.0 - 0.2 - K/uL    ------------    Imm Grnas 1   \- %    ------------    NRBC 0   \- %    ------------    Imm Grans, Abs 0.1  0.0 - 0.1 - K/uL    ------------    NRBC, Abs 0.0  <0.0 - K/uL    ------------      **Follow Up**    ---    After GI follow-up    Electronically signed by Erasmo Leventhal , MD on 06/22/2019 at 04:49  PM EDT    Sign off status: Completed        * * *        Rheumatology, Allergy and Immunology    [address]    [place] [address], [address]    [place], [place] [address]    Tel: [phone]    Fax: [phone]              * * *          Progress Note: [name], MD 06/20/2019    ---    Note generated by eClinicalWorks EMR/PM Software ([email or web address])

## 2019-06-20 NOTE — Progress Notes (Signed)
 .  Progress Notes  .  Patient: [name], [name] [facility]  Provider: [name]  DOB:1961-01-27 Age: 59 Y Sex: Female  .  PCP: Ellin Goodie  MD  Date: 06/20/2019  .  --------------------------------------------------------------------------------  .  HISTORY OF PRESENT ILLNESS  .  GENERAL:  She continues to do very poorly. She was  admitted to [facility] with severe anemia. She underwent upper and  lower endoscopy with no identified etiology for her presumed GI  bleed. She is due to get a capsule study, but is very confused  about her appointment and feels she has not gotten a great  response from the GI department. She has significant pain,  stiffness and swelling in the hands, not better since rituximab  treatments. Her Raynaud continues to act up. she is struggling to  do ADLs. She also has significant trouble walking and overall  feels terrible since leaving the hospital. She has had some  weight gain from steroids but no other side effects. She still  [place] but it is on hold given  her recent hospitalization. She has not had any recent infections  and she has been socially distant and wearing masks. She got  COVID vaccine.  .  CURRENT MEDICATIONS  .  Taking Acetaminophen 500 MG Tablet 2 tablets as needed Orally  every 8 hrs  Taking Baclofen 10 MG Tablet 1/2 tab Orally prn, Notes: prn  Taking Bactrim 400-80 MG Tablet 1 tablet Orally Once a day  Taking Calcium Carbonate-Vitamin D 600-200 MG-UNIT Capsule 1  capsule with a meal Orally Twice a day  Taking Famotidine 40 MG Tablet 1 tablet at bedtime Orally Once a  day, Notes: prn  Taking Gammagard - Solution as directed Injection once every 4  weeks, Notes: every 4 weeks  Taking GaviLyte-C 240 GM Solution Reconstituted (If unavailable,  may dispense Gavilyte-Nper MD) 4000 ml Orally Please follow Jet  instructions  Taking GlipiZIDE XL 5 MG Tablet Extended Release 24 Hour 1 tablet  Orally Once a day  Taking Glucophage XR 500 mg  Tablet Extended Release 24 Hour 4  tablets Orally Once a day  Taking Hydroxychloroquine Sulfate 200 MG Tablet 1 tablet with  food or milk Orally Once a day  Taking Ibuprofen 600 MG Tablet 1 tablet with food or milk as  needed Orally Three times a day  Taking Jardiance 10 MG Tablet 1 tablet Orally Once a day  Taking Lidocaine 5 % Patch 1 patch remove after 12 hours  Externally Once a day  Taking Lisinopril 2.5 MG Tablet 1 tablet Orally Once a day  Taking Lovenox 30 MG/0.3ML Solution 0.3 ml Subcutaneous every 12  hrs  Taking Lovenox 40 MG/0.4ML Solution 0.4 ml Subcutaneous Once a  day  Taking Omeprazole 40 mg Capsule Delayed Release 1 capsule Orally  Once a day  Taking Oxycodone HCl 5 MG Tablet 1 tablet as needed Orally every  4-6 hrs  Taking PredniSONE 10 MG Tablet 2-3 tablet Orally Once a day  Taking Rituxan 500 MG/50ML Solution as directed Intravenous ,  Notes: Last dose 11/29/18  Taking Rosuvastatin Calcium 10 mg Tablet 1 tablet Orally Once a  day  Taking Tramadol HCl 50 MG Tablet 1 tablet Orally every 6 hrs,  Notes: PRN - rarely takes  Taking Vitamin B Complex - Tablet 1 tablet Orally once daily  Taking Vitamin D 25 MCG (1000 UT) Tablet 1 tablet Orally Once a  day  Taking  Voltaren 1 % Gel 2 grams Transdermal twice daily  Medication List reviewed and reconciled with the patient  .  PAST MEDICAL HISTORY  .  Scleroderma - CREST dx 2007  IgG4 deficiency s/p IVIG 2010 ----single infusion given  preventively after week of bilateral knee replacements at [facility]  2010  Fx left wrist in 1994  Blood clot at LUE in 2006 on a short course of Coumadin  Bilateral carpal tunnel syndrome s/p Lt carpal tunnel release and  steroids injection right--  Bone spur left foot  Scoliosis  Spinal stenosis s/p steroids injection  s/p bilateral knee replacements for valgus /arthritic  complications, performed by Dr. Katrinka Blazing 2010--- never infected, but  packed with antibiotics with surgery;  Right rotator cuff repairs x 4, complicated by repeat  tears,  infection, placement of anchor material  Elevated liver function tests  Diabetes  Seronegative RA  .  ALLERGIES  .  N.K.D.A.  .  SOCIAL HISTORY  .  .  Tobacco  history: Never smoked.  .  Marital Status Single.  .  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  .  Alcohol Former daily EtOH use in 20s.  .  Nonsmoker.Lives with longstanding boyfriend.  [name]  REVIEW OF SYSTEMS  .  Rheumatology:  .  General    No fever, weight loss, swollen glands . Muscoskeletal     +Joint pain, +joint swelling, +morning stiffness  . Eyes    No  vision loss, eye dry, red eye, eye pain . Mouth    No mouth sores  . Cardiovascular    ? raynaud, no chest pain , irregular heart  beat, racing heart beat, leg swelling . Pulmonary    No cough,  cough blood, shortness of breath . Skin    No rash,  photosensitivity . Lymphatics    No swollen glands, tender glands  .  [name]  VITAL SIGNS  .  Pain scale 5, Ht-in 62, Wt-lbs 156, BMI 28.53, BP 132/80, HR 85,  BSA 1.76, O2 99, Ht-cm 157.48, Wt-kg 70.76, Wt Change 9 lb.  [name]  EXAMINATION  .  Rheumatology:  General Appearance: Alert and oriented , No apparent distress .  Eyes: No, scleral icterus, scleral erythema .  Pulm:Clear to auscultation bilaterally, no  crackles/wheezes/rhonchi.  AbdomenSoft, nondistended.  Skin:No rashes or nail changes.  MuskuloskeletalElbows, shoulders, hips, knees have intact ROM  with no synovitis. Hands have synovitis in the 2-3 PIPs and MCPs  bilaterally. Positive MCP squeeze b/l. Heberden's node to R2 DIP.  Skin and NailsMinimal sclerodactyly, some cracking atthe  fingertips, but no ulcers.  .  ASSESSMENTS  .  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  Scleroderma - M34.9  .  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  Long-term use of high-risk medication - Z79.899  .  She continues to do poorly, and is now far enough out from  rituximab dose that it should be working. We discussed  alternatives including cyclophsophamide and mycophenolate.  I  prefer the latter but gave her reading information on both. I  have been hesitant to pursue these given her CVID however the  monthly IVIG infusions have kept her infection free. She has now  had multiple complications from long term steroid use including  tendon ruptures, muscle atrophy, and possible GI bleed. Therefore  I think at this point the risk of steroids outweighs the risk of  other immunosuppressants. However she is dealing with a couple  other issues. She had an  echo which showed possible ASD. She is  due to follow up with cardiology next week. She also has the  probable GI bleed and is due for a capsule procedure. I recommend  we hold on further treatment until she has these two procedures,  in case further intervention is planned. I will plan to see her  back in 1 month right after her GI follow-up is complete and we  can plan accordingly. She will also continue monthly IVIG  infusions to treat CVID. She is at high risk for infection with  the treatments plus CVID, especially during the pandemic. I  encouraged her to maintain safe practices during the pandemic.  [name]  TREATMENT  .  Scleroderma  LAB: Basic Metabolic Panel (BMP)  Anion Gap     8     (3 - 14 - )  Calcium (Ca)     9.6     (8.5 - 10.5 - mg/dL)  Chloride (CL)     098     (98 - 110 - mEq/L)  CO2     25     (20 - 30 - mEq/L)  Creatinine (CR)     0.75     (0.57 - 1.30 - mg/dL)  Glucose     119     (70 - 139 - mg/dL)  Potassium (K)     4.4     (3.6 - 5.1 - mEq/L)  Sodium (NA)     139     (135 - 145 - mEq/L)  Blood Urea Nitrogen     15     (6 - 24 - mg/dL)  .  [name]  LAB: Hepatic Function/Liver Function (LFTP)  .  LAB: ESR Adult Heme-Onc  .  LAB: PTT (PTT)  PTT     27.6     (25.7 - 35.7 - sec)  .  [name]  LAB: Manual Prothrombin Time  .  LAB: C-Reactive Protein (CRP)  C-Reactive Protein - CRP (Ultra-Wide Range)     1.46     (0.00 -  7.48 - mg/L)  .  [name]  LAB: CBC/DIFF with PLT (CBCWD)  WBC     9.8     (4.0 - 11.0 - K/uL)  RBC     3.94     (3.70 - 5.00 -  M/uL)  HGB     11.6     (11.0 - 15.0 - g/dL)  HCT     14.7     (82.9 - 45.0 - %)  MCV     99.5     (80.0 - 98.0 - fL)  MCH     29.4     (26.0 - 34.0 - pg)  MCHC     29.6     (32.0 - 36.0 - g/dL)  RDW     56.2     (13.0 - 14.5 - %)  PLT     291     (150 - 400 - K/uL)  MPV     10.5     (9.1 - 11.7 - fL)  SEG NEUT     92     ( - %)  LYMPH     5     ( - %)  MONO     2     ( - %)  EOS     0     ( - %)  BASO     0     ( - %)  NEUT #     9.1     (1.5 - 7.5 - K/uL)  LYMPH #     0.5     (1.0 - 4.0 - K/uL)  MONO #     0.2     (0.2 - 0.8 - K/uL)  EOSIN #     0.0     (0.0 - 0.5 - K/uL)  BASO #     0.0     (0.0 - 0.2 - K/uL)  Imm Grnas     1     ( - %)  NRBC     0     ( - %)  Imm Grans, Abs     0.1     (0.0 - 0.1 - K/uL)  NRBC, Abs     0.0     (<0.0 - K/uL)  .  FOLLOW UP  .  After GI follow-up  .  Electronically signed by Erasmo Leventhal , MD on  06/22/2019 at 04:49 PM EDT  .  Document electronically signed by [name]

## 2019-06-20 NOTE — Progress Notes (Signed)
 General Medicine Visit - Resident note with preceptor addendum  .  DIAB EYE EX, MAMMOGRAM.    .  * Mettler: [name] ([name])  .  Patient Details and Vitals  Patient reviewed in/by:  Office  Patient Best Phone # [phone]  Patient Email Address [name]  .  BMI: 27.49  .  BP: 120/ 81  Ht (inches): 62.5  Weight: 152.2  BMI: 27.49  Temp: 98.9  Pulse: 113  O2 Sat: 97  .  Med List: PRINTED by Scott for patient   hd_medl: printed  .  [name]  Patient Medical History   Travel outside of the [place] in past 28 days:: No  .  In the past year, have you ... Had no falls  Difficulty with balance? NO  Use a Cane NO  Use a Walker NO  Need assistance with ambulation while here? NO  .  [name]  Tobacco use? never smoker  Does patient experience chronic pain ? YES  Severity of pain? (min=0, max=10) 5  .  Patient Screening   .  .  .  .  PHQ2 (Q1) Little Interest or pleasure in doing things: 0  PHQ2 (Q2) Feeling down, depressed or hopeless: 0  PHQ2 Score: 0  .  Abuse/Neglect (Q1) Feel unsafe in relationship: NO  Abuse/Neglect (Q2) Hurt in past year: NO  .  .  .  .  .  Data to be shared to Telemed/Office Visits   June 10, 2019 1:47 PM  Screening Ballinger: [name] ([name])  Chronic Pain, level = 5  PHQ2 Score: 0  .  ---------- ---------- ----------   ......................................Marland KitchenMerline [name]  June 10, 2019 1:47   PM  .  .  .  .  **Resident Note**  .  .  Patient Prep Updates   [place] Pre-Visit Notes:  June 10, 2019 1:47 PM  Screening Jessie: [name] ([name])  Chronic Pain, level = 5  PHQ2 Score: 0  .  ---------- ---------- ----------   ......................................Marland KitchenMerline [name]  June 10, 2019 1:47   PM  .  .  .  .  .  .  .  .  .  * Preceptor: Margaretmary Bayley Aram Beecham)  CC: scheduled follow up  .  HPI: [name] is a 59 year old femal with history of scleroderma, IgG4 def  iciency, diabetes, HTN and HLD who presents for follow up visit and reports   recent hospital admission for GI bleed.   [name]  She was seen in clinic 3/5 by Dr. Pollyann Kennedy  for d/c follow up visit.   .  GI bleed  -recent hospitalized 10 days ago [facility] for GI bleed d/c   on 2/27  - EGD /T/ colonscopy done /T/ found nonbleeding erosive gastropathy, divert  iculosis in sigmoid colon, descending and travnserve colon, internal hemorr  hoids  - Hgb at discharge on 2/27 was 8.8, received 1 unit of blood /T/ venofer IV   in hospital  - seen by Dr. Pollyann Kennedy in [facility] on 3/6, Hbg 8.9  - stools are "olive green charcol color" in last 24 hrs, previously having   black stools  - currently no lightheadedness, though has been feeling tired since d/c fro  m hosptial  .  Swelling in bilateral LE  - reported bilateral extremity swelling when saw Dr. 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  Bruising  - Noticed bruising in arms during hospitalization, likely from steroid use  Appears expected iso trauma due to IVs. No thrombophelmbitis, hematomas not  ed.  .  .  Past Medical History:(reviewed)  HEALTH CARE MAINTENANCE  STD- low risk, deferred  Pap smear - 01/17/2019- nml neg HPV  Mammogram - normal 03/2017. normal 02/01/2018. repeat 2020 sheduled  Colonoscopy -  done 2016, repeat 2017 and both times poor prep but overall   has been sufficient for screening purposes.   In combination, these two col  onoscopies have likely given an adequate colon cancer screening. Recommend   repeat colonoscopy in 3 years.  1 polyp removed, next colonoscopy in 3 yrs   (2020). ***At that time in 2020, will do 7 days of daily miralax. Seven day  s low fiber diet. Two days clear liquids and a split prep with 2 dulcolax b  efore each dose of golytely.  DEXA (>75yr) - 2017 normal bone mass; on chronic prednisone use. 02/01/2018   nml bone mass, next due 2022  Lung  cancer screening (55-80)- N/A  Influenza - annually  HPV - N/A  Hep C (6295-2841) - non reactive 2017  Shingles shot - recommended she get Shingrix when/where it's available  Pneumonia - PPSV23 01/26/2017. Due for booster at age 82.  Tetanus and TDAP 11/04/2014  Contraception - post menopausal  LDL - 110 (01/04/2018)  HGBA1C - 7.7  (01/04/2018)  .  Family History: (reviewed)   Father: DMII, died of MI at 16  Mom: died of lung CA at 86  MGM and PGM: lung ca (smokers)  MGF and several other second degree relatives: brain aneurysms in their 75'  s.  2 maternal uncles:melanoma (one died of unknown cancer, one living)  brother recently diagnosed with systemic scleroderma c/b gangrene of fingers  .  Social History: (reviewed)   Non-smoker, rare etoh, no IVDU. 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 Separated from life partner of 20 years jan 20  20 and now planning to move south. No children. Always wears a seat belt.  .  Review of Systems: see pi  .  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) (ICD10-M47.812)  NECK PAIN (ICD-723.1) (ICD10-M54.2)  STEROID USE, LONG TERM (ICD-V58.65) (ICD10-Z79.52)  PREMATURE VENTRICULAR CONTRACTIONS (ICD-427.69) (ICD10-I49.3)  HYPERLIPIDEMIA (ICD-272.4) (ICD10-E78.5)  BACK PAIN, LUMBAR, CHRONIC (ICD-724.2) (ICD10-M54.5)  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)  DIABETES MELLITUS, TYPE II (ICD-250.00) (ICD10-E11.9)  LONG-TERM (CURRENT) USE OF STEROIDS (ICD-V58.65) (ICD10-Z79.51)  SKIN SAGGING DUE TO WEIGHT LOSS (ICD-757.9) (ICD10-Q84.9)  TRANSAMINASES, SERUM, ELEVATED (ICD-790.4) (ICD10-R74.0)  OBESITY, BMI 30-34.9, ADULT (ICD-278.00) (ICD10-E66.9)      DIABETES MELLITUS, TYPE  II, UNCONTROLLED (ICD-250.02) (ICD10-E11.65)      FATTY LIVER DISEASE (ICD-571.8) (ICD10-K76.0)  SCLERODERMA, 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)  HYPOTHYROIDISM (ICD-244.9) (ICD10-E03.9)  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) (ICD10-D  80.8)  SPINAL STENOSIS, 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)  HEPATITIS C EXPOSURE (HCV RNA NEGATIVE, 01/2014) (ICD-V02.62) (ICD10-Z20.5)  PAIN IN JOINT, HAND (ICD-719.44) (ICD10-M79.643)  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)  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)  CARPAL TUNNEL (ICD-354.0) (ICD10-G56.00)  OSTEOARTHRITIS (ICD-715.09) (ICD10-M15.9)  S/P ROTATOR CUFF SURGERY 12/2004 - RT SHOULDER; 2008 LT (ICD-V45.89)  Family Hx of MELANOMA, FAMILY HX (ICD-V16.8) (ICD10-Z80.8)  FRACTURE OF OTHER SPEC SITE,  PATHOLOGIC - MULTIPLE (ICD-733.19)  CHOLECYSTECTOMY AND HERNIA REPAIR (ICD-V45.89)  VITAMIN D DEFICIENCY (ICD-268.9) (ICD10-E55.9)  COLONOSCOPY, NEXT 2020- SEE COMMENT (ICD-V76.51) (ICD10-Z01.89)  PROBLEM CHANGES:  Added new problem of ANEMIA, OTHER (ICD-285.8) (ICD10-D64.89) - Signed  Added new problem of SCREENING EXAM FOR BREAST CANCER (ICD-V76.10) (ICD10-Z  12.39) - Signed  .  [name]  MEDICATIONS:   PAST MEDICINES (prior to today's visit):  HYDROXYCHLOROQUINE SULFATE 200 MG ORAL TABLET (HYDROXYCHLOROQUINE SULFATE)   one tablet oral daily; Route: ORAL  JARDIANCE 10 MG ORAL TABLET (EMPAGLIFLOZIN) Take 1 tablet by mouth once dai  ly; Route: ORAL  ROSUVASTATIN 10MG  TABLETS (ROSUVASTATIN CALCIUM) TAKE 1 TABLET BY MOUTH EVE  RY DAY  LISINOPRIL 2.5MG  TABLETS (LISINOPRIL) TAKE 1 TABLET BY MOUTH ONCE DAILY  METFORMIN HCL ER 500 MG XR24H-TAB (METFORMIN HCL) TAKE 4 TABLETS BY MOUTH E  VERY MORNING  GLIPIZIDE ER 2.5 MG XR24H-TAB (GLIPIZIDE) TAKE 1 TABLET BY MOUTH DAILY  OMEPRAZOLE 20 MG ORAL CAPSULE DELAYED RELEASE (OMEPRAZOLE) Take one capsule   by mouth twice a day; Route: ORAL  IBUPROFEN 600 MG ORAL TABLET (IBUPROFEN) 1 tab by mouth TID as needed for  p  ain  BACLOFEN 10MG  TABLETS (BACLOFEN) TAKE 1/2 TABLET BY MOUTH EVERY DAY AS NEED  ED FOR BACK OR SPASMS  DICLOFENAC SODIUM 1 % TRANSDERMAL GEL (DICLOFENAC SODIUM) APP 2 GRAMS EXT A  A BID      FREESTYLE FREEDOM LITE w/Device KIT (BLOOD GLUCOSE MONITORING SUPPL) [place]  e as directed (ICD 10 E11.65)      FREESTYLE LITE BLOOD GLUCOSE STRIPS (GLUCOSE BLOOD) USE TO CHECK BLOOD   GLUCOSE THREE TIMES DAILY      FREESTYLE LANCETS (LANCETS) check fingerstick three times a day (ICD 10   E11.65)  TRAMADOL HCL 50 MG ORAL TABLET (TRAMADOL HCL) take one tablet daily as need  ed for pain; Route: ORAL  OXYCODONE HCL 5 MG ORAL TABLET (OXYCODONE HCL) Take one tablet daily as nee  ded for pain >8. Partial fill upon patient request.; Route: ORAL  CALCIUM CARBONATE-VITAMIN D 600-200 MG-UNIT ORAL TABLET (CALCIUM CARBONATE-  VITAMIN D) take one tablet twice a day; Route: ORAL  GABAPENTIN 300MG  CAPSULES (GABAPENTIN) TAKE 1 CAPSULE BY MOUTH EVERY NIGHT   AT BEDTIME AS NEEDED FOR PAIN  * BLOODWORK Please draw chemistry (sodium, potassium, magnesium, chloride,   bicarbonate, glucose, creatinine, BUN), CBC, TSH, lipids, HbA1c. Fax result   to [phone] Dr. Marlane Hatcher  PREDNISONE 10 MG ORAL TABLET (PREDNISONE) Take 3 tablets once a day; Route:   ORAL  FAMOTIDINE 20 MG ORAL TABLET (FAMOTIDINE) Take 1 tablet  at night as needed  .; Route: ORAL  BACTRIM 400-80 MG ORAL TABLET (SULFAMETHOXAZOLE-TRIMETHOPRIM) ; Route: ORAL  .  MEDICINE CHANGES:  Removed medication of IBUPROFEN 600 MG ORAL TABLET (IBUPROFEN) 1 tab by mou  th TID as needed for pain - Signed  Removed medication of BACTRIM 400-80 MG ORAL TABLET (SULFAMETHOXAZOLE-TRIME  THOPRIM); Route: ORAL - Signed  Added new medication of IRON 325 (65 FE) MG ORAL TABLET (FERROUS SULFATE) T  ake one tablet once a day; Route: ORAL - Signed  Medications Reviewed:  Done  .  [name]  ALLERGIES:   No Known Allergies  Allergies Reviewed:  Done  .  [name]  DIRECTIVES:   .  .  .  Vitals:   BP: 120 / 81 mmHg Ht: 62.5 in.   Wt: 152.2 lbs.  BMI (in-lb) 27.49  Temp: 98.9deg F.   Pulse Rate: 113 bpm O2 Sat: 97 %  .  [name]  Additional PE: Gen: well appearing middle aged female, no acute distress  CV: RRR, no murmurs  Pulm: CTAB, normal work of breathing  Abd: soft, nontender, nondistended, +BS  Ext: 1+ pitting edema to mid shins bilaterally, nontender calves, erythemat  ous group of papules over right ankle   .  [name]  Assessment /T/ Plan:   59 year old femal with history of scleroderma, IgG4 deficiency, diabetes, H  TN and HLD who presents for follow up visit and reports recent hospital adm  ission for GI bleed.   .  1. GI bleed/  anemia  Patient recently hospitalized for GI bleed, no active bleeding on EGD or co  lonoscopy but found to has nonbleeding erosive gastropathy, divericulosis a  nd internal hemorrhoids. Hgb at time of discharge from hospital 8.8, and re  cent check on 3/5 8.9 so has been stable. Initially tachycardic on vitals b  ut exam normal rate /R/90, BP normal. She is no longer having melana so unl  ikely has ongoing bleed.   - avoid NSAIDs  - repeat CBC at next visit 3/22 (patient counseled to arrive 1 hr early for   blood draw)  - has colonscopy scheduled, will try to get sooner appointment w/ GI for co  nsideration of capsule study if bleed recurs/ CBC does not improve  .  2. Edema  Patient experiencing new bilateral LE edema after recent d/c for GI bleed.   Notes that she did receive IVF while hospitalized. RLE U/s on 3/5 demonstra  ted evidence of remote hx DVT in femoral vein. Patient may have venous insu  fficiency causing edema.  - scheduled for TTE 3/11  --- f/u results of echo at next visit  - encouraged LE elevation and compression stockings  .  3. Health Maintenace  - due for mammogram  - scheduled for repeat colonoscopy 08/2019 w/ more aggressive prep per 2017   endoscopy report  - last PAP 01/2019 nml, neg HPV  - f/u 3/22 for GI bleed  .  .  .  * Resident Signature (.sign):  ......................................Marland KitchenEllin Goodie MD -JN 6A-  June 10, 2019 5:17 PM  .  .  .  ORDERS:  RTC in 4 weeks [RTC-028]  Est Level 4 (MDM or 30-39 min) [CPT-99214]  CBC/DIFF (with Plt) [WBC] [CPT-85025]  Mammo Screening [CPT-77057]  .  [name]  Vital Signs   Height: 62.5 inches  Weight: 152.2 pounds  Temperature: 98.9 degrees  F   Pulse rate: 113  O2 Sat: 97  Blood Pressure #1: 120/81 mm Hg  Blood Pressure #2: 120/81 mm Hg  Body Mass Index: 27.49  .  .  **Preceptor Note**  Resident: Ellin Goodie)  Problem Follow-up  Other Prob(s): d/c after GI bleed  anamia  edeam legs  .  admitted t OSH  .  [name]  Subjective   Patient is a 59 Years Old Female who presents to clinic got 1 unit of blood   I discussed the patient with Dr. Tomasa Rand Chanetta Marshall) in a routine precepting   encounter.  I also personally interviewed and examined the patient.  I agr  ee with the patient's diagnosis and management.   [name]  [name]  Seen On Team (Floor):  6A  .  .  .  Patient Care Plan  .  .  .  Immunization Worksheet 2019 (rev 05/16/2018)   .  .  .  .  .  .  .  .  .  .  .  .  .  Follow-up With:   .  ]  .  [name]  Electronically Signed by Para March, MD on 06/10/2019 at 6:20 PM  ________________________________________________________________________  record reviewed  .  the c scop e was orderd in 01/2019 visit   this was for screening for colon a  .  patient already got c scope in OSH when admitted for GI bleed  .  needs  f/u witht Gi soon   .  [name]  Electronically Signed by Para March, MD on 06/20/2019 at 3:25 PM  ________________________________________________________________________

## 2019-06-23 ENCOUNTER — Ambulatory Visit

## 2019-06-23 ENCOUNTER — Ambulatory Visit: Admitting: Student in an Organized Health Care Education/Training Program

## 2019-06-23 ENCOUNTER — Ambulatory Visit: Admitting: Internal Medicine

## 2019-06-23 ENCOUNTER — Ambulatory Visit: Admitting: Cardiovascular Disease

## 2019-06-23 ENCOUNTER — Ambulatory Visit: Admit: 2019-06-23

## 2019-06-23 NOTE — Progress Notes (Signed)
 .  Progress Notes  .  Patient: [name], HUTMACHER [facility]  Provider: Billee Cashing    .  DOB: 12-Mar-1961 Age: 59 Y Sex: Female  Supervising Provider:: Joaquin Music, MD  Date: 06/23/2019  .  PCP: Ruffin Frederick    Date: 06/23/2019  .  --------------------------------------------------------------------------------  .  REASON FOR APPOINTMENT  .  1. PER GMA.  [name]  HISTORY OF PRESENT ILLNESS  .  GENERAL:   I had the pleasure of seeing [name] in general cardiology  consult clinic at [facility] today.As you may know, Ms.  [name] is a 58 year old femal with history of scleroderma, IgG4  deficiency, DM, HTN, HLD, DVT in 2006 not on AC. She was admited  for GIB at [facility] in 05/2019. She underwent EGD  and C-scope which showed no acute bleeding. She received IVF and  RBC which led to swelling of her legs after dicharge. She was  followed by her PCP and underwent Doppler and TTE. 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 TTE was unremarkable except  intra-atrial shunt. She was referred to our clinic for further  workup. Today, she is feeling well. Her swelling has already  resolved.She denies fever, dizziness, DOE, SOB, chest pain,  palpitations, nausea, abd pain, diarrhea, constipation.  .  CURRENT MEDICATIONS  .  Taking Acetaminophen 500 MG Tablet 2 tablets as needed Orally  every 8 hrs, Taking Baclofen 10 MG Tablet 1/2 tab Orally prn,  Notes: prn, Taking Calcium Carbonate-Vitamin D 600-200 MG-UNIT  Capsule 1 capsule with a meal Orally Twice a day, Taking  Famotidine 40 MG Tablet 1 tablet at bedtime Orally Once a day,  Notes: prn, Taking Gammagard - Solution as directed Injection  once every 4 weeks, Notes: every 4 weeks, Taking GlipiZIDE XL 5  MG Tablet Extended Release 24 Hour 1 tablet Orally Once a day,  Taking Glucophage XR 500 mg Tablet Extended Release 24 Hour 4  tablets Orally Once a day, Taking Hydroxychloroquine Sulfate 200  MG Tablet 1 tablet with food or milk Orally Once a day,  Taking  Jardiance 10 MG Tablet 1 tablet Orally Once a day, Taking  Lidocaine 5 % Patch 1 patch remove after 12 hours Externally Once  a day, Notes: prn, Taking Lisinopril 2.5 MG Tablet 1 tablet  Orally Once a day, Taking Omeprazole 40 mg Capsule Delayed  Release 1 capsule Orally Once a day, Taking Oxycodone HCl 5 MG  Tablet 1 tablet as needed Orally every 4-6 hrs, Taking PredniSONE  10 MG Tablet 2-3 tablet Orally Once a day, Taking Rituxan 500  MG/50ML Solution as directed Intravenous , Notes: Last dose  05/19/19, Taking Rosuvastatin Calcium 10 mg Tablet 1 tablet Orally  Once a day, Taking Tramadol HCl 50 MG Tablet 1 tablet Orally  every 6 hrs, Notes: PRN - rarely takes, Taking Vitamin B Complex  - Tablet 1 tablet Orally once daily, Taking Vitamin D 25 MCG  (1000 UT) Tablet 1 tablet Orally Once a day, Taking Voltaren 1 %  Gel 2 grams Transdermal twice daily, Discontinued Bactrim 400-80  MG Tablet 1 tablet Orally Once a day, Discontinued GaviLyte-C 240  GM Solution Reconstituted (If unavailable, may dispense  Gavilyte-Nper MD) 4000 ml Orally Please follow [place]  instructions, Discontinued Ibuprofen 600 MG Tablet 1 tablet with  food or milk as needed Orally Three times a day, Discontinued  Lovenox 30 MG/0.3ML Solution 0.3 ml Subcutaneous every 12 hrs,  Discontinued Lovenox 40 MG/0.4ML Solution 0.4  ml Subcutaneous  Once a day, Medication List reviewed and reconciled with the  patient  .  PAST MEDICAL HISTORY  .  Scleroderma - CREST dx 2007, IgG4 deficiency s/p IVIG 2010  ----single infusion given preventively after week of bilateral  knee replacements at [facility] 2010, Fx left wrist in 1994, Blood clot  at LUE in 2006 on a short course of Coumadin, Bilateral carpal  tunnel syndrome s/p Lt carpal tunnel release and steroids  injection right--, Bone spur left foot, Scoliosis, Spinal  stenosis s/p steroids injection, s/p bilateral knee replacements  for valgus /arthritic complications, performed by Dr. Katrinka Blazing  2010--- never  infected, but packed with antibiotics with  surgery;, Right rotator cuff repairs x 4, complicated by repeat  tears, infection, placement of anchor material, Elevated liver  function tests, Diabetes, Seronegative RA.  [name]  ALLERGIES  .  N.K.D.A.  .  SURGICAL HISTORY  .  Right rotator cuff repair, with infected hardware that had to be  removed 2006, Repeat right shoulder surgery, also which became  infected. 2007, Left rotator cuff repair 2008, bilateral knee  replacements 2009, Left arthroscopic carpal tunnel release  01/2011, ORIF Left 4th metatarsal bone , Left Ulna shortening  1994, Knee replacement 2010, Hand surgery 2013, 2015, remove  gallbladder/hernia 1990, Plate left foot 3rd metatarsal 2008,  Left reverse TSA 03/07/16.  [name]  FAMILY HISTORY  .  Mother: deceased 39 yrs, lung cancer, hyperthyroidism, diagnosed  with Other malignant neoplasm of unspecified site  Father: deceased 88s yrs, heart attack, Unspecified heart disease  3 brother(s) .  FH of arthritis and scleroderma\nFather deceased from MI\nMother  deceased age 102 with lung cancer \\nBrother  with scleroderma \/  copd.  .  SOCIAL HISTORY  .  .  Tobacco  history: Never smoked.  .  Marital Status Single.  .  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  .  Alcohol Former daily EtOH use in 20s.  .  Nonsmoker.Lives with longstanding boyfriend.  [name]  HOSPITALIZATION/MAJOR DIAGNOSTIC PROCEDURE  .  as above .  [name]  REVIEW OF SYSTEMS  .  12 point ROS negative except HPI.  [name]  VITAL SIGNS  .  Pain scale 5, Ht-in 62, Ht-cm 157.48.  [name]  EXAMINATION  .  Cardiac Testing:  EKG: EKG 06/23/2019: NSR, HR 91 bpm, QRSd 98 mse, QTc 446 ms,  PVCsEKG 06/06/2019: NSR, HR 92 bpm, QRSd 84 mse, QTc 451 ms, PVCs.  Echo:06/12/19: LVEF 60%, no FWMA, nl RV, mildly dilated LA, nl RA,  intraatrial shunt +, nl TV, mild TR, nl RVSP, nl AV .  Labs 06/2019: Cr 0.75 K 4.4 AST 37 ALK 302 ALT 91 TBil 0.5WBC 9.8  Hb 11.6 PLT 291 ESR 32 CRP 1.46 INR 0.9.  .  PHYSICAL EXAMINATION  .  GENERAL:  General  Appearance:  unremarkable, Looks Healthy, well-developed.  Mood/Affect:  pleasant. HEENT  Normocephalic, atraumatic. Sclera  clear, conjunctiva pink, moist oral mucosa, neck supple. Neck  No  carotid Bruits. Skin  there is excoriation and mild swelling of  her fingers. Cardiovascular  regular rate and rhythm, normal S1  S2, no S3 S4 no mrg. there is PVCs. Lungs  Clear to auscultation;  no wheezes, rhonchi or rales. Abdomen  Soft, nontender,  nondistended, no organmegaly. Extremities  There was no clubbing,  cyanosis or edema noted.  .  ASSESSMENTS  .  PFO (patent foramen ovale) - Q21.1 (Primary)  .  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  I had the pleasure  of seeing [name] in general cardiology  consult clinic at [facility] today. As you may know,  Ms. Dowe is a 59 year old femal with history of scleroderma,  IgG4 deficiency, DM, HTN, HLD, DVT in 2006 not on AC. She was  referred to our clinic for the evaluatioin of intra-atrial shunt.  The available evidence from population-based studies suggests  that PFO and large PFO are not independent risk factors for  ischemic stroke in otherwise asymptomatic individuals and  generally no intervention is indicated in asymptomatic  patients.However, given her hx of DVT, she may benefit from  intervention. We will reach out to her primary care regarding the  management of DVT. With regard to PVCs, EKG is suggestive of RV  in origin. We will order pharmacological stress test to evaluate  for structural heart disease and ziopatch to evaluate for NSVTs  and PVC burden. Given paucity of symptoms, Pt does not require  any treatment at this point. In terms of DVT, pt reports Hx of  DVT when she had surgery in 2006. She was treated with [facility]. She is  currently not on any AC. Recent Doppler showed chronic thrombus  in Rt femoral vein. We will defer to her PCP for the management  of DVT. We had the pleasure of seeing Ms. Chabot today. Please  feel free to contact us if you have any  questions.  .  FOLLOW UP  .  2 months  .  [name]  Appointment Provider: Billee Cashing, MD  .  Electronically signed by Joaquin Music , MD on  07/14/2019 at 11:49 AM EDT  .  CONFIRMATORY SIGN OFF  I personally interviewed and examined the patient and both the fellow and I contributed to this electronic note. I agree with the history, exam, assessment and plan as detailed in this note and edited it as necessary. GALPER,JONAS 07/14/2019 11:44:06 AM >   .  Document electronically signed by Billee Cashing    .

## 2019-06-23 NOTE — Progress Notes (Signed)
 [name] [name], [name] [name] **DOB:** [birth year] (59 yo F) **Acc No.** [id number] **DOS:**  06/23/2019    ---      **Progress Notes**    ---    **Patient:** [name] [name], [name] [name]     **Account Number:** [id number] **External MRN:** [id number]  **Appointment  Provider:** Billee Cashing, MD     **DOB:** February 01, 1961 **Age:** 59 Y **Sex:** Female  **Supervising Provider:**  Joaquin Music, MD     **Phone:** [phone]  **Date:** 06/23/2019  **CHN#:** [id number]     **Address:** [address], [place], [id number]     **Pcp:** Ruffin Frederick        * * *         **Subjective:**        ---      **Chief Complaints:**    ------      1\. PER GMA.    ------     **HPI:**    _GENERAL_ :    I had the pleasure of seeing [name] [name] in general cardiology consult clinic at  [facility] today.    As you may know, [name] [name] is a 59 year old femal with history of scleroderma,  IgG4 deficiency, DM, HTN, HLD, DVT in 2006 not on AC.    She was admited for GIB at [facility] in 05/2019. She underwent  EGD and C-scope which showed no acute bleeding. She received IVF and RBC which  led to swelling of her legs after dicharge. She was followed by her PCP and  underwent Doppler and TTE. 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  TTE was unremarkable except intra-atrial shunt. She was referred to our clinic  for further workup.    Today, she is feeling well. Her swelling has already resolved.She denies  fever, dizziness, DOE, SOB, chest pain, palpitations, nausea, abd pain,  diarrhea, constipation.    ------      **ROS:**    12 point ROS negative except HPI.    ------      **Medical History:** Scleroderma - CREST dx 2007, IgG4 deficiency s/p IVIG  2010 ----single infusion given preventively after week of bilateral knee  replacements at [facility] 2010, Fx left wrist in 1994, Blood clot at LUE in 2006 on  a short course of Coumadin, Bilateral carpal tunnel syndrome s/p Lt carpal  tunnel release and steroids injection right--, Bone spur left  foot, Scoliosis,  Spinal stenosis s/p steroids injection, s/p bilateral knee replacements for  valgus /arthritic complications, performed by Dr. Katrinka Blazing 2010--- never  infected, but packed with antibiotics with surgery;, Right rotator cuff  repairs x 4, complicated by repeat tears, infection, placement of anchor  material, Elevated liver function tests, Diabetes, Seronegative RA.        ------     **Surgical History:** Right rotator cuff repair, with infected hardware that  had to be removed 2006, Repeat right shoulder surgery, also which became  infected. 2007, Left rotator cuff repair 2008, bilateral knee replacements  2009, Left arthroscopic carpal tunnel release 01/2011, ORIF Left 4th  metatarsal bone , Left Ulna shortening 1994, Knee replacement 2010, Hand  surgery 2013, 2015, remove gallbladder/hernia 1990, Plate left foot 3rd  metatarsal 2008, Left reverse TSA 03/07/16.    ------     **Hospitalization/Major Diagnostic Procedure:** as above .    ------     **Family History:** Mother: deceased 97 yrs, lung cancer, hyperthyroidism,  diagnosed with Other malignant neoplasm of unspecified site. Father: deceased  69s yrs, heart attack, Unspecified heart  disease. 3 brother(s) . [name]    FH of arthritis and scleroderma\nFather deceased from MI\nMother deceased age  45 with lung cancer \\nBrother  with scleroderma \/ copd.    ------      **Social History:** Tobacco history: Never smoked. Marital Status  Single.  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 Alcohol  Former daily EtOH  use in 20s.    Nonsmoker.    Lives with longstanding boyfriend.    ------      **Medications:** Taking Acetaminophen 500 MG Tablet 2 tablets as needed  Orally every 8 hrs, Taking Baclofen 10 MG Tablet 1/2 tab Orally prn, Notes:  prn, Taking Calcium Carbonate-Vitamin D 600-200 MG-UNIT Capsule 1 capsule with  a meal Orally Twice a day, Taking Famotidine 40 MG Tablet 1 tablet at bedtime  Orally Once a day, Notes: prn, Taking Gammagard  - Solution as directed  Injection once every 4 weeks, Notes: every 4 weeks, Taking GlipiZIDE XL 5 MG  Tablet Extended Release 24 Hour 1 tablet Orally Once a day, Taking Glucophage  XR 500 mg Tablet Extended Release 24 Hour 4 tablets Orally Once a day, Taking  Hydroxychloroquine Sulfate 200 MG Tablet 1 tablet with food or milk Orally  Once a day, Taking Jardiance 10 MG Tablet 1 tablet Orally Once a day, Taking  Lidocaine 5 % Patch 1 patch remove after 12 hours Externally Once a day,  Notes: prn, Taking Lisinopril 2.5 MG Tablet 1 tablet Orally Once a day, Taking  Omeprazole 40 mg Capsule Delayed Release 1 capsule Orally Once a day, Taking  Oxycodone HCl 5 MG Tablet 1 tablet as needed Orally every 4-6 hrs, Taking  PredniSONE 10 MG Tablet 2-3 tablet Orally Once a day, Taking Rituxan 500  MG/50ML Solution as directed Intravenous , Notes: Last dose 05/19/19, Taking  Rosuvastatin Calcium 10 mg Tablet 1 tablet Orally Once a day, Taking Tramadol  HCl 50 MG Tablet 1 tablet Orally every 6 hrs, Notes: PRN - rarely takes,  Taking Vitamin B Complex - Tablet 1 tablet Orally once daily, Taking Vitamin D  25 MCG (1000 UT) Tablet 1 tablet Orally Once a day, Taking Voltaren 1 % Gel 2  grams Transdermal twice daily, Discontinued Bactrim 400-80 MG Tablet 1 tablet  Orally Once a day, Discontinued GaviLyte-C 240 GM Solution Reconstituted (If  unavailable, may dispense Gavilyte-Nper MD) 4000 ml Orally Please follow [place]  instructions, Discontinued Ibuprofen 600 MG Tablet 1 tablet with food or milk  as needed Orally Three times a day, Discontinued Lovenox 30 MG/0.3ML Solution  0.3 ml Subcutaneous every 12 hrs, Discontinued Lovenox 40 MG/0.4ML Solution  0.4 ml Subcutaneous Once a day, Medication List reviewed and reconciled with  the patient    ------      **Allergies:** N.K.D.A.    ------         **Objective:**        ---      **Vitals:** Pain scale 5, Ht-in 62, Ht-cm 157.48.    ------      **Examination:    ** _Cardiac Testing:_     EKG:  **EKG 06/23/2019:** NSR, HR 91 bpm, QRSd 98 mse, QTc 446 ms, PVCs **EKG  06/06/2019:** NSR, HR 92 bpm, QRSd 84 mse, QTc 451 ms, PVCs.    Echo: **06/12/19:** LVEF 60%, no FWMA, nl RV, mildly dilated LA, nl RA,  intraatrial shunt +, nl TV, mild TR, nl RVSP, nl AV .    Labs  **06/2019:** Cr 0.75 K 4.4 AST 37 ALK 302 ALT 91  TBil 0.5WBC 9.8 Hb 11.6  PLT 291 ESR 32 CRP 1.46 INR 0.9.        ------         **Physical Examination:**    _GENERAL_ :    General Appearance: unremarkable, Looks Healthy, well-developed. Mood/Affect:  pleasant. HEENT Normocephalic, atraumatic. Sclera clear, conjunctiva pink,  moist oral mucosa, neck supple. Neck No carotid Bruits. Skin there is  excoriation and mild swelling of her fingers. Cardiovascular regular rate and  rhythm, normal S1 S2, no S3 S4 no mrg. there is PVCs. Lungs Clear to  auscultation; no wheezes, rhonchi or rales. Abdomen Soft, nontender,  nondistended, no organmegaly. Extremities There was no clubbing, cyanosis or  edema noted.        ------        **Assessment:**        ---      **Assessment:**        1\. PFO (patent foramen ovale) - Q21.1 (Primary)    2\. 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 As you may know, [name] [name] is a 59 year old  femal with history of scleroderma, IgG4 deficiency, DM, HTN, HLD, DVT in 2006  not on AC. She was referred to our clinic for the evaluatioin of intra-atrial  shunt. The available evidence from population-based studies suggests that PFO  and large PFO are not independent risk factors for ischemic stroke in  otherwise asymptomatic individuals and generally no intervention is indicated  in asymptomatic patients.However, given her hx of DVT, she may benefit from  intervention. We will reach out to her primary care regarding the management  of DVT.    With regard to PVCs, EKG is suggestive of RV in origin. We will  order  pharmacological stress test to evaluate for structural heart disease and  ziopatch to evaluate for NSVTs and PVC burden. Given paucity of symptoms, Pt  does not require any treatment at this point. In terms of DVT, pt reports Hx  of DVT when she had surgery in 2006. She was treated with [facility]. She is currently  not on any AC. Recent Doppler showed chronic thrombus in Rt femoral vein. We  will defer to her PCP for the management of DVT.    We had the pleasure of seeing [name] [name] today. Please feel free to contact us  if you have any questions.         **Plan:**        ---        ------      **Follow Up:** 2 months    ------    ---    ---                ---    ---    Electronically signed by Joaquin Music , MD on 07/14/2019 at 11:49 AM EDT    Sign off status: Completed          * * *      **Appointment Provider:** Billee Cashing, MD  **Date:** 06/23/2019    ------

## 2019-06-23 NOTE — Progress Notes (Signed)
 General Medicine Visit - Resident note with preceptor addendum  .  DIAB EYE EX, MAMMOGRAM.    .  * [place]: [facility])  .  Patient Details and Vitals  Patient reviewed in/by:  Office  Patient Best Phone # [phone]  Patient Email Address [name]  .  BMI: 27.64  .  BP: 114/ 73  Ht (inches): 62.5  Weight: 153.0  BMI: 27.64  Temp: 97.5  Resp Rate: 19  Pulse: 69  .  Med List: PRINTED by [place] for patient   hd_medl: printed  .  [name]  Patient Medical History   Travel outside of the [place] in past 28 days:: No  .  In the past year, have you ... Had no falls  Difficulty with balance? NO  Use a Cane NO  Use a Walker NO  .  [name]  Does patient experience chronic pain ? YES  Severity of pain? (min=0, max=10) 5  Have you received a Flu shot recently (where)? [facility]  Approx Date: 02/18/2019  .  Patient Screening   .  SDOH: Housing situation: I have housing  SDOH: Run out or ability to purchase food: Never true  SDOH: Utility shutoff: No  SDOH: Miss appt lack transportation: No  SDOH: unemployed or looking for work: No  SDOH: Socially withdrawn: Sometimes  .  .  .  PHQ2 (Q1) Little Interest or pleasure in doing things: 0  PHQ2 (Q2) Feeling down, depressed or hopeless: 0  PHQ2 Score: 0  .  Abuse/Neglect (Q1) Feel unsafe in relationship: NO  Abuse/Neglect (Q2) Hurt in past year: NO  .  .  .  .  .  Data to be shared to Telemed/Office Visits   June 23, 2019 3:42 PM  Screening [place]: [facility])  Chronic Pain, level = 5  PHQ2 Score: 0  .  ---------- ---------- ----------   .  ......................................Marland KitchenDortha Schwalbe. [place]  June 23, 2019 3:4  2 PM  .  .  **Resident Note**  .  .  Patient Prep Updates   [place] Pre-Visit Notes:  June 23, 2019 3:42 PM  Screening [place]: [facility] [place] [facility])  Chronic Pain, level = 5  PHQ2 Score: 0  .  ---------- ---------- ----------   .  .  [name]  * Preceptor: Naggar Ree Kida)  CC: f/u GIB  .  HPI: This is a 59 y/o female with a PMH of hypothyroidism, IgG4 deficiency,   scleroderma, who  presents for f/u of recent GIB.  [name]  Pt states they believe her GIB was in the middle of her intestines somewher  e as she had upper and lower scopes and they did not see anything. Pt has h  ad difficulty scheduling an appt with GI.   [name]  Pt denies lightheadedness, states that she has had intermittent melena sinc  e then but generally has not had it. Had bloodwork done by Rheum here last   week with improving Hgb, now /R/11, baseline /R/13, previous Hgb had been 8  .8.  .  .  Past Medical History:(reviewed)  HEALTH CARE MAINTENANCE  STD- low risk, deferred  Pap smear - 01/17/2019- nml neg HPV  Mammogram - normal 03/2017. normal 02/01/2018. repeat 2020 sheduled  Colonoscopy -  done 2016, repeat 2017 and both times poor prep but overall   has been sufficient for screening purposes.   In combination, these two col  onoscopies have likely given  an adequate colon cancer screening. Recommend   repeat colonoscopy in 3 years.  1 polyp removed, next colonoscopy in 3 yrs   (2020). ***At that time in 2020, will do 7 days of daily miralax. Seven day  s low fiber diet. Two days clear liquids and a split prep with 2 dulcolax b  efore each dose of golytely.  DEXA (>27yr) - 2017 normal bone mass; on chronic prednisone use. 02/01/2018   nml bone mass, next due 2022  Lung cancer screening (55-80)- N/A  Influenza - annually  HPV - N/A  Hep C (5784-6962) - non reactive 2017  Shingles shot - recommended she get Shingrix when/where it's available  Pneumonia - PPSV23 01/26/2017. Due for booster at age 39.  Tetanus and TDAP 11/04/2014  Contraception - post menopausal  LDL - 110 (01/04/2018)  HGBA1C - 7.7  (01/04/2018)  .  Family History: (reviewed)   Father: DMII, died of MI at 84  Mom: died of lung CA at 67  MGM and PGM: lung ca (smokers)  MGF and several other second degree relatives: brain aneurysms in their 34'  s.  2 maternal uncles:melanoma (one died of unknown cancer, one living)  brother recently diagnosed with systemic scleroderma c/b  gangrene of fingers  .  Social History: (reviewed)   Non-smoker, rare etoh, no IVDU. 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 Separated from life partner of 20 years jan 20  20 and now planning to move south. No children. Always wears a seat belt.  .  .  .  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)  SCREENING EXAM FOR BREAST CANCER (ICD-V76.10) (ICD10-Z12.39)  ANEMIA, OTHER (ICD-285.8) (ICD10-D64.89)  LOWER EXTREMITY EDEMA (ICD-782.3) (ICD10-R60.0)  IRREGULAR HEART BEATS (ICD-427.9) (ICD10-I49.9)  GI BLEED (ICD-578.9) (XBM84-X32.2)  SCLERODERMA (ICD-710.1) (ICD10-M34.9)  SCREENING FOR CERVICAL CANCER (ICD-V76.2) (GMW10-U72.4)  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) (ICD10-M47.812)  NECK PAIN (ICD-723.1) (ICD10-M54.2)  STEROID USE, LONG TERM (ICD-V58.65) (ICD10-Z79.52)  PREMATURE VENTRICULAR CONTRACTIONS (ICD-427.69) (ICD10-I49.3)  HYPERLIPIDEMIA (ICD-272.4) (ICD10-E78.5)  BACK PAIN, LUMBAR, CHRONIC (ICD-724.2) (ICD10-M54.5)  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)  DIABETES MELLITUS, TYPE II (ICD-250.00) (ICD10-E11.9)  LONG-TERM (CURRENT) USE OF STEROIDS (ICD-V58.65) (ICD10-Z79.51)  SKIN SAGGING DUE TO WEIGHT LOSS (ICD-757.9) (ICD10-Q84.9)  TRANSAMINASES, SERUM, ELEVATED (ICD-790.4) (ICD10-R74.0)  OBESITY, BMI 30-34.9, ADULT (ICD-278.00) (ICD10-E66.9)      DIABETES MELLITUS, TYPE II, UNCONTROLLED (ICD-250.02) (ICD10-E11.65)      FATTY LIVER DISEASE (ICD-571.8) (ICD10-K76.0)  SCLERODERMA, 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)  HYPOTHYROIDISM (ICD-244.9) (ICD10-E03.9)  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) (ICD10-D  80.8)  SPINAL STENOSIS, 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)  HEPATITIS C EXPOSURE (HCV RNA NEGATIVE, 01/2014) (ICD-V02.62) (ICD10-Z20.5)  PAIN IN JOINT, HAND (ICD-719.44) (ICD10-M79.643)  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  [name]  MEDICATIONS:   PAST MEDICINES (prior to today's visit):  HYDROXYCHLOROQUINE SULFATE 200 MG ORAL TABLET (HYDROXYCHLOROQUINE SULFATE)   one tablet oral daily; Route: ORAL  JARDIANCE 10 MG ORAL TABLET (EMPAGLIFLOZIN) Take 1 tablet by mouth once dai  ly;  Route: ORAL  ROSUVASTATIN 10MG  TABLETS (ROSUVASTATIN CALCIUM) TAKE 1 TABLET BY MOUTH EVE  RY DAY  LISINOPRIL 2.5MG  TABLETS (LISINOPRIL) TAKE 1 TABLET BY MOUTH ONCE DAILY  METFORMIN HCL ER 500 MG XR24H-TAB (METFORMIN HCL) TAKE 4 TABLETS BY MOUTH E  VERY MORNING  GLIPIZIDE ER 2.5 MG XR24H-TAB (GLIPIZIDE) TAKE 1 TABLET BY MOUTH DAILY  OMEPRAZOLE 20 MG ORAL CAPSULE DELAYED RELEASE (OMEPRAZOLE) Take one capsule   by mouth twice a day; Route: ORAL  BACLOFEN 10MG  TABLETS (BACLOFEN) TAKE 1/2 TABLET BY MOUTH EVERY DAY AS NEED  ED FOR BACK OR SPASMS  DICLOFENAC SODIUM 1 % TRANSDERMAL GEL (DICLOFENAC SODIUM) APP 2 GRAMS EXT A  A BID      FREESTYLE FREEDOM LITE w/Device KIT (BLOOD GLUCOSE MONITORING SUPPL) [place]  e as directed (ICD 10 E11.65)      FREESTYLE LITE BLOOD GLUCOSE STRIPS (GLUCOSE BLOOD) USE TO CHECK BLOOD   GLUCOSE THREE TIMES DAILY      FREESTYLE LANCETS (LANCETS) check fingerstick three times a day (ICD 10   E11.65)  TRAMADOL HCL 50 MG ORAL TABLET (TRAMADOL HCL) take one tablet daily as need  ed for pain; Route: ORAL  OXYCODONE HCL 5 MG ORAL TABLET (OXYCODONE HCL) Take one tablet daily as nee  ded for pain >8. Partial fill upon patient request.; Route: ORAL  CALCIUM CARBONATE-VITAMIN D 600-200 MG-UNIT ORAL TABLET (CALCIUM CARBONATE-  VITAMIN D)  take one tablet twice a day; Route: ORAL  GABAPENTIN 300MG  CAPSULES (GABAPENTIN) TAKE 1 CAPSULE BY MOUTH EVERY NIGHT   AT BEDTIME AS NEEDED FOR PAIN  * BLOODWORK Please draw chemistry (sodium, potassium, magnesium, chloride,   bicarbonate, glucose, creatinine, BUN), CBC, TSH, lipids, HbA1c. Fax result   to [phone] Dr. Marlane Hatcher  PREDNISONE 10 MG ORAL TABLET (PREDNISONE) Take 3 tablets once a day; Route:   ORAL  FAMOTIDINE 20 MG ORAL TABLET (FAMOTIDINE) Take 1 tablet  at night as needed  .; Route: ORAL  IRON 325 (65 FE) MG ORAL TABLET (FERROUS SULFATE) Take one tablet once a da  y; Route: ORAL  .  .  .  ALLERGIES:   .  [name]  DIRECTIVES:   .  .  .  Vitals:   BP: 114 / 73 mmHg Ht: 62.5 in.  Wt: 153.0 lbs.  BMI (in-lb) 27.64  Temp: 97.5deg F.   Pulse Rate: 69 bpm Resp Rate: 19 bpm  .  .  Additional PE: General: A well-appearing female sitting in a chair in NAD  .  [name]  Assessment /T/ Plan:   This is a 59 y/o female with a PMH of hypothyroidism, IgG4 deficiency, scle  roderma, who presents for f/u of recent GIB.  [name]  #GIB  Pt endorses intermittent melena last week, otherwise is improved. Hgb is im  proved today to 11.6. Pt has new pt appt scheduled with GI for 4/8  .  .  [name]  * Resident Signature (.sign): .......................................Vertell Novak MD -JN 6A-  June 23, 2019 4:27 PM  .  .  .  ORDERS:  Est Level 3 (MDM or 20-29 min) [[id number]]  .  [name]  **Preceptor Note**  Resident: Vertell Novak)  Problem Follow-up  .  [name]  Subjective   Patient is a 59 Years Old Female who presents to clinic Recent upper GI ble  ed.  Had upper GI and colonoscopy that did not show evidence of bleeding.    She has a follow-up with GI in April.  Evaluate for other causes of occult   bleeding perhaps capsule study is warranted.  Last hemoglobin was in the 11   range.   I discussed the patient with Dr. Zachery Conch [facility]) in a routine precepting   encounter.  I agree with the patient's diagnosis and management.    .  .  Objective   see  resident's note for details.    .  Plan   See resident note for details, Follow-up per resident note  .  Seen On Team (Floor):  6A  .  .  .  Patient Care Plan  .  .  .  .  .  .  .  [name]  Fluvax #1   Dose: [facility]  .  .  .  .  [name]  Immunization Worksheet 2019 (rev 05/16/2018)   .  .  .  .  .  .  .  .  .  .  .  .  .  Follow-up With:   .  ]  .  [name]  Electronically Signed by Ruffin Frederick, MD on 06/24/2019 at 10:01 AM  ________________________________________________________________________

## 2019-06-23 NOTE — Progress Notes (Signed)
 General Medicine Visit - Resident note with preceptor addendum    DIAB EYE EX, MAMMOGRAM.      * [place]: [facility] [place] [facility])    Patient Details and Vitals  Patient reviewed in/by:  Office  Patient Best Phone # [phone]  Patient Email Address [name]    BMI: 27.64    BP: 114/ 73  Ht (inches): 62.5  Weight: 153.0  BMI: 27.64  Temp: 97.5  Resp Rate: 19  Pulse: 69    Med List: PRINTED by [place] for patient   hd_medl: printed      Patient Medical History   Travel outside of the [place] in past 28 days:: No    In the past year, have you ... Had no falls  Difficulty with balance? NO  Use a Cane NO  Use a Walker NO      Does patient experience chronic pain ? YES  Severity of pain? (min=0, max=10) 5  Have you received a Flu shot recently (where)? [facility]  Approx Date: 02/18/2019    Patient Screening     SDOH: Housing situation: I have housing  SDOH: Run out or ability to purchase food: Never true  SDOH: Utility shutoff: No  SDOH: Miss appt lack transportation: No  SDOH: unemployed or looking for work: No  SDOH: Socially withdrawn: Sometimes        PHQ2 (Q1) Little Interest or pleasure in doing things: 0  PHQ2 (Q2) Feeling down, depressed or hopeless: 0  PHQ2 Score: 0    Abuse/Neglect (Q1) Feel unsafe in relationship: NO  Abuse/Neglect (Q2) Hurt in past year: NO            Data to be shared to Telemed/Office Visits   June 23, 2019 3:42 PM  Screening [place] [place] [facility]  Chronic Pain, level = 5  PHQ2 Score: 0    ---------- ---------- ----------        ......................................Marland KitchenSharlot Gowda  June 23, 2019 3:42 PM      **Resident Note**      Patient Prep Updates   [place] Pre-Visit Notes:  June 23, 2019 3:42 PM  Screening [place]: [facility] [place] [facility])  Chronic Pain, level = 5  PHQ2 Score: 0    ---------- ---------- ----------                   * Preceptor: Naggar Ree Kida)  CC: f/u GIB    HPI: This is a 59 y/o female with a PMH of hypothyroidism, IgG4 deficiency, scleroderma, who presents for f/u of recent GIB.    Pt  states they believe her GIB was in the middle of her intestines somewhere as she had upper and lower scopes and they did not see anything. Pt has had difficulty scheduling an appt with GI.     Pt denies lightheadedness, states that she has had intermittent melena since then but generally has not had it. Had bloodwork done by Rheum here last week with improving Hgb, now ~11, baseline ~13, previous Hgb had been 8.8.      Past Medical History:(reviewed)  HEALTH CARE MAINTENANCE  STD- low risk, deferred  Pap smear - 01/17/2019- nml neg HPV  Mammogram - normal 03/2017. normal 02/01/2018. repeat 2020 sheduled  Colonoscopy -  done 2016, repeat 2017 - both times poor prep but overall has been sufficient for screening purposes.   In combination, these two colonoscopies have likely given an adequate colon cancer screening. Recommend repeat colonoscopy in 3 years.  1 polyp removed,  next colonoscopy in 3 yrs (2020). ***At that time in 2020, will do 7 days of daily miralax. Seven days low fiber diet. Two days clear liquids and a split prep with 2 dulcolax before each dose of golytely.  DEXA (>31yr) - 2017 normal bone mass; on chronic prednisone use. 02/01/2018 nml bone mass, next due 2022  Lung cancer screening (55-80)- N/A  Influenza - annually  HPV - N/A  Hep C (2952-8413) - non reactive 2017  Shingles shot - recommended she get Shingrix when/where it's available  Pneumonia - PPSV23 01/26/2017. Due for booster at age 59.  Tetanus - TDAP 11/04/2014  Contraception - post menopausal  LDL - 110 (01/04/2018)  HGBA1C - 7.7  (01/04/2018)    Family History: (reviewed)   Father: DMII, died of MI at 47  Mom: died of lung CA at 36  MGM and PGM: lung ca (smokers)  MGF and several other second degree relatives: brain aneurysms in their 8's.  2 maternal uncles:melanoma (one died of unknown cancer, one living)  brother recently diagnosed with systemic scleroderma c/b gangrene of fingers    Social History: (reviewed)   Non-smoker, rare etoh, no  IVDU. 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 Separated from life partner of 20 years jan 2020 and now planning to move south. No children. Always wears a seat belt.        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)  SCREENING EXAM FOR BREAST CANCER (ICD-V76.10) (ICD10-Z12.39)  ANEMIA, OTHER (ICD-285.8) (ICD10-D64.89)  LOWER EXTREMITY EDEMA (ICD-782.3) (ICD10-R60.0)  IRREGULAR HEART BEATS (ICD-427.9) (ICD10-I49.9)  GI BLEED (ICD-578.9) (KGM01-U27.2)  SCLERODERMA (ICD-710.1) (ICD10-M34.9)  SCREENING FOR CERVICAL CANCER (ICD-V76.2) (OZD66-Y40.4)  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) (ICD10-M47.812)  NECK PAIN (ICD-723.1) (ICD10-M54.2)  STEROID USE, LONG TERM (ICD-V58.65) (ICD10-Z79.52)  PREMATURE VENTRICULAR CONTRACTIONS (ICD-427.69) (ICD10-I49.3)  HYPERLIPIDEMIA (ICD-272.4) (ICD10-E78.5)  BACK PAIN, LUMBAR, CHRONIC (ICD-724.2) (ICD10-M54.5)  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)  DIABETES MELLITUS, TYPE II (ICD-250.00) (ICD10-E11.9)  LONG-TERM (CURRENT) USE OF STEROIDS (ICD-V58.65) (ICD10-Z79.51)  SKIN SAGGING DUE TO WEIGHT LOSS (ICD-757.9) (ICD10-Q84.9)  TRANSAMINASES, SERUM, ELEVATED (ICD-790.4) (ICD10-R74.0)  OBESITY, BMI 30-34.9, ADULT (ICD-278.00) (ICD10-E66.9)      DIABETES MELLITUS, TYPE II, UNCONTROLLED (ICD-250.02) (ICD10-E11.65)      FATTY LIVER DISEASE (ICD-571.8) (ICD10-K76.0)  SCLERODERMA, 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)  HYPOTHYROIDISM (ICD-244.9) (ICD10-E03.9)  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) (ICD10-D80.8)  SPINAL STENOSIS, 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)  HEPATITIS C EXPOSURE (HCV RNA NEGATIVE, 01/2014) (ICD-V02.62) (ICD10-Z20.5)  PAIN IN JOINT, HAND (ICD-719.44) (ICD10-M79.643)  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)  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)  CARPAL TUNNEL (ICD-354.0) (ICD10-G56.00)  OSTEOARTHRITIS (ICD-715.09) (ICD10-M15.9)  S/P ROTATOR CUFF  SURGERY 12/2004 - RT SHOULDER; 2008 LT (ICD-V45.89)  Family Hx of MELANOMA, FAMILY HX (ICD-V16.8) (ICD10-Z80.8)  FRACTURE OF OTHER SPEC SITE,  PATHOLOGIC - MULTIPLE (ICD-733.19)  CHOLECYSTECTOMY AND HERNIA REPAIR (ICD-V45.89)  VITAMIN D DEFICIENCY (ICD-268.9) (ICD10-E55.9)  COLONOSCOPY, NEXT 2020- SEE COMMENT (ICD-V76.51) (ICD10-Z01.89)         MEDICATIONS:   PAST MEDICINES (prior to today's visit):  HYDROXYCHLOROQUINE SULFATE 200 MG ORAL TABLET (HYDROXYCHLOROQUINE SULFATE) one tablet oral daily; Route: ORAL  JARDIANCE 10 MG ORAL TABLET (EMPAGLIFLOZIN) Take 1 tablet by mouth once daily; Route: ORAL  ROSUVASTATIN 10MG  TABLETS (ROSUVASTATIN CALCIUM) TAKE 1 TABLET BY MOUTH EVERY DAY  LISINOPRIL 2.5MG  TABLETS (LISINOPRIL) TAKE 1 TABLET BY MOUTH ONCE DAILY  METFORMIN HCL ER 500 MG XR24H-TAB (  METFORMIN HCL) TAKE 4 TABLETS BY MOUTH EVERY MORNING  GLIPIZIDE ER 2.5 MG XR24H-TAB (GLIPIZIDE) TAKE 1 TABLET BY MOUTH DAILY  OMEPRAZOLE 20 MG ORAL CAPSULE DELAYED RELEASE (OMEPRAZOLE) Take one capsule by mouth twice a day; Route: ORAL  BACLOFEN 10MG  TABLETS (BACLOFEN) TAKE 1/2 TABLET BY MOUTH EVERY DAY AS NEEDED FOR BACK OR SPASMS  DICLOFENAC SODIUM 1 % TRANSDERMAL GEL (DICLOFENAC SODIUM) APP 2 GRAMS EXT AA BID      FREESTYLE FREEDOM LITE w/Device KIT (BLOOD GLUCOSE MONITORING SUPPL) use as directed (ICD 10 E11.65)      FREESTYLE LITE BLOOD GLUCOSE STRIPS (GLUCOSE BLOOD) USE TO CHECK BLOOD GLUCOSE THREE TIMES DAILY      FREESTYLE LANCETS (LANCETS) check fingerstick three times a day (ICD 10 E11.65)  TRAMADOL HCL 50 MG ORAL TABLET (TRAMADOL HCL) take one tablet daily as needed for pain; Route: ORAL  OXYCODONE HCL 5 MG ORAL TABLET (OXYCODONE HCL) Take one tablet daily as needed for pain >8. Partial fill upon patient request.; Route: ORAL  CALCIUM CARBONATE-VITAMIN D 600-200 MG-UNIT ORAL TABLET (CALCIUM CARBONATE-VITAMIN D) take one tablet twice a day; Route: ORAL  GABAPENTIN 300MG  CAPSULES (GABAPENTIN) TAKE 1 CAPSULE BY MOUTH EVERY  NIGHT AT BEDTIME AS NEEDED FOR PAIN  * BLOODWORK Please draw chemistry (sodium, potassium, magnesium, chloride, bicarbonate, glucose, creatinine, BUN), CBC, TSH, lipids, HbA1c. Fax result to [phone] Dr. Marlane Hatcher  PREDNISONE 10 MG ORAL TABLET (PREDNISONE) Take 3 tablets once a day; Route: ORAL  FAMOTIDINE 20 MG ORAL TABLET (FAMOTIDINE) Take 1 tablet  at night as needed.; Route: ORAL  IRON 325 (65 FE) MG ORAL TABLET (FERROUS SULFATE) Take one tablet once a day; Route: ORAL           ALLERGIES:          DIRECTIVES:         Vitals:   BP: 114 / 73 mmHg Ht: 62.5 in.  Wt: 153.0 lbs.  BMI (in-lb) 27.64  Temp: 97.5deg F.   Pulse Rate: 69 bpm Resp Rate: 19 bpm      Additional PE: General: A well-appearing female sitting in a chair in NAD      Assessment & Plan:   This is a 59 y/o female with a PMH of hypothyroidism, IgG4 deficiency, scleroderma, who presents for f/u of recent GIB.    #GIB  Pt endorses intermittent melena last week, otherwise is improved. Hgb is improved today to 11.6. Pt has new pt appt scheduled with GI for 4/8          * Resident Signature (.sign): .......................................Vertell Novak MD -JN 6A-  June 23, 2019 4:27 PM        ORDERS:  Est Level 3 (MDM or 20-29 min) [[id number]]      **Preceptor Note**     Resident: Vertell Novak)  Problem Follow-up      Subjective   Patient is a 58 Years Old Female who presents to clinic Recent upper GI bleed.  Had upper GI and colonoscopy that did not show evidence of bleeding.  She has a follow-up with GI in April.  Evaluate for other causes of occult bleeding perhaps capsule study is warranted.  Last hemoglobin was in the 11 range.   I discussed the patient with Dr. Zachery Conch [facility]) in a routine precepting encounter.  I agree with the patient's diagnosis and management.        Objective   see resident's note for details.      Plan   See resident  note for details, Follow-up per resident note    Seen On Team (Floor):  6A        Patient Care  Plan                  Fluvax #1   Dose: [facility]            Immunization Worksheet 2019 (rev 05/16/2018)                             Follow-up With:     ]      Created By Sharlot Gowda on 06/23/2019 at 03:36 PM    Electronically Signed By Ruffin Frederick MD on 06/24/2019 at 10:01 AM

## 2019-06-24 ENCOUNTER — Ambulatory Visit: Admitting: Rheumatology

## 2019-06-24 ENCOUNTER — Ambulatory Visit

## 2019-06-24 MED ORDER — BACLOFEN: 0.5 | 20 | 1 refills | 0 days | Status: AC

## 2019-06-24 NOTE — Progress Notes (Signed)
* * *      [name], [name] **DOB:** 09-30-1960 (59 yo F) **Acc No.** [id number] **DOS:**  06/24/2019    ---       [name], [name]**    ------    35 Y old Female, DOB: September 05, 1960    [address], [place], [place] [address]    Home: [phone]    Provider: [name]        * * *    Telephone Encounter    ---    Answered by  Denton Meek Date: 06/24/2019       Time: 01:44 PM    Caller  Pt    ------            Reason  Appt            Message                     GI-4/1      Cardiologist-      Echo-ASD-->since they did bubble test it showed small defect.  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        They are going to do a stress test--> she will call me with date.      Pt also has some irregular heart rhythm on EKG-->Put pt on monitor fo two weeks to see      She is doing PT-->Ortho concerned for further muscle damage                Action Taken                     [facility]  06/24/2019 1:47:51 PM > Spoke wth pt as above.  She would like me to update [name].  She will call me and let me know when her stress test is scheduled and we will set up f/u with [name] after that.      [name],[name]  06/27/2019 1:34:49 PM > Did not do infusion.  Waiting on other specialties/workups first.  She will update me.      [name],[name]  07/02/2019 4:24:42 PM > Hi Admin.  Can we please scheudle a f/u with [name] with this pt. Thanks!      [name],[name]  07/03/2019 8:57:57 AM >scheduled                    * * *                ---          * * *         Provider: [name] 06/24/2019    ---    Note generated by eClinicalWorks EMR/PM Software ([email or web address])

## 2019-07-03 ENCOUNTER — Ambulatory Visit: Admitting: Physical Medicine & Rehabilitation

## 2019-07-03 ENCOUNTER — Ambulatory Visit: Admitting: Student in an Organized Health Care Education/Training Program

## 2019-07-03 ENCOUNTER — Ambulatory Visit

## 2019-07-03 ENCOUNTER — Ambulatory Visit: Admit: 2019-07-03

## 2019-07-03 NOTE — Progress Notes (Signed)
 .  Progress Notes  .  Patient: [name] [facility]  [name]    .  DOB: May 20, 1960 Age: 59 Y Sex: Female  .  PCP: Ruffin Frederick    Date: 07/03/2019  .  --------------------------------------------------------------------------------  .  REASON FOR APPOINTMENT  .  1. RT HIP LOWER BACK PAIN  .  HISTORY OF PRESENT ILLNESS  .  General:   Referred by Dr Leandro Reasoner.I had the pleasure of seeing [name] [name]  [name] for initial evaluation of right sided buttock and lateral  leg pain. She had a left proximal hamstring repair in January  2021 and is doing well with her left leg but has had persistent  pain on her right side without any particular inciting event or  trauma. She points to the area of the GTB and along the ITB. It  does not radiate past the knee. She has been working with  physical therapy and doing exercises, Graston technique, rolling,  etc., all of which is helping, but progress has been slow and  frustrating. She denies any true low back pain or weakness,  numbness or tingling into the right leg. She denies any new bowel  or bladder dysfunction. She presents today for consideration of  trigger point injections into her areas of pain.  [name]  CURRENT MEDICATIONS  .  Taking Acetaminophen 500 MG Tablet 2 tablets as needed Orally  every 8 hrs  Taking Baclofen 10 MG Tablet 1/2 tab Orally prn, Notes: prn  Taking Calcium Carbonate-Vitamin D 600-200 MG-UNIT Capsule 1  capsule with a meal Orally Twice a day  Taking Famotidine 40 MG Tablet 1 tablet at bedtime Orally Once a  day, Notes: prn  Taking Gammagard - Solution as directed Injection once every 4  weeks, Notes: every 4 weeks  Taking GlipiZIDE XL 5 MG Tablet Extended Release 24 Hour 1 tablet  Orally Once a day  Taking Glucophage XR 500 mg Tablet Extended Release 24 Hour 4  tablets Orally Once a day  Taking Hydroxychloroquine Sulfate 200 MG Tablet 1 tablet with  food or milk Orally Once a day  Taking Jardiance 10 MG Tablet 1 tablet Orally Once a day  Taking  Lidocaine 5 % Patch 1 patch remove after 12 hours  Externally Once a day, Notes: prn  Taking Lisinopril 2.5 MG Tablet 1 tablet Orally Once a day  Taking Omeprazole 40 mg Capsule Delayed Release 1 capsule Orally  Once a day  Taking Oxycodone HCl 5 MG Tablet 1 tablet as needed Orally every  4-6 hrs  Taking PredniSONE 10 MG Tablet 2-3 tablet Orally Once a day  Taking Rituxan 500 MG/50ML Solution as directed Intravenous ,  Notes: Last dose 05/19/19  Taking Rosuvastatin Calcium 10 mg Tablet 1 tablet Orally Once a  day  Taking Tramadol HCl 50 MG Tablet 1 tablet Orally every 6 hrs,  Notes: PRN - rarely takes  Taking Vitamin B Complex - Tablet 1 tablet Orally once daily  Taking Vitamin D 25 MCG (1000 UT) Tablet 1 tablet Orally Once a  day  Discontinued Voltaren 1 % Gel 2 grams Transdermal twice daily  Medication List reviewed and reconciled with the patient  .  PAST MEDICAL HISTORY  .  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 Katrinka Blazing 2010--- never infected, but  packed with antibiotics with surgery;  Right rotator cuff repairs x 4, complicated by repeat tears,  infection, placement of anchor material  Elevated liver function tests  Diabetes  Seronegative RA  GI bleed  GERD  .  ALLERGIES  .  N.K.D.A.  .  SURGICAL HISTORY  .  Right rotator cuff repair, with infected hardware that had to be  removed 2006  Repeat right shoulder surgery, also which became infected. 2007  Left rotator cuff repair 2008  bilateral knee replacements 2009  Left arthroscopic carpal tunnel release 01/2011  ORIF Left  4th metatarsal bone  Left Ulna shortening 1994  Knee replacement 2010  Hand surgery 2013, 2015  remove gallbladder/hernia 1990  Plate left foot 3rd metatarsal 2008  Left reverse TSA 03/07/16  .  FAMILY HISTORY  .  Mother: deceased 36 yrs, lung cancer, hyperthyroidism, diagnosed  with Other malignant neoplasm of unspecified site  Father: deceased 57s yrs, heart attack, Unspecified heart disease  Siblings: alive, Brother - scleroderma, COPD  3 brother(s) .  Denies family history of GI cancer.  .  SOCIAL HISTORY  .  .  Tobacco  history: Never smoked.  .  Marital Status Single.  .  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  .  Alcohol Former daily EtOH use in 20s.  .  Abuse/NeglectDo you feel unsafe in your relationships?No ,  PlanResources Provided, Patient states they feel safe to return  home , Have you ever been hit, kicked, punched or otherwise hurt  by someone in the past year? No.  .  Illicit drugs: Denies.  .  Nonsmoker.Lives with longstanding boyfriend.  [name]  HOSPITALIZATION/MAJOR DIAGNOSTIC PROCEDURE  .  as above  .  REVIEW OF SYSTEMS  .  ORT:  .  Eyes    No . Ear, Nose Throat    No . Digestion, Stomach, Bowel     No . Bladder Problems    No . Bleeding Problems    No .  Numbness/Tingling    No . Anxiety/Depression    No .  Fever/Chills/Fatigue    No . Chest Pain/Tightness/Palpitations     No . Skin Rash    No . Dental Problems    No . Joint/Muscle  Pain/Cramps    Yes . Blackout/Fainting    No . Other    No .  .  VITAL SIGNS  .  Pain scale 5, Ht-in 62, Wt-lbs 156, BMI 28.53, BSA 1.76, Ht-cm  157.48, Wt-kg 70.76, Wt Change -.6 lb.  .  PHYSICAL EXAMINATION  .  General: NAD Psych: Mood and affect appropriate HEENT: NC/AT CV:  warm and well perfused Pulm: breathing unlabored, normal chest  expansions Skin: No rash, swelling, ecchymoses, or erythemaGait:  not antalgic; able to heel, toe and tandem walk+TTP over right  gluteus maximus, right piriformis, right TFL, GTB.  [name]  ASSESSMENTS  .  Trochanteric  bursitis, right hip - M70.61 (Primary)  .  Iliotibial band syndrome of right side - M76.31  .  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  [name] is a 59 year old female with recent left proximal  tear fixed surgically, who presents with persistent right buttock  and lateral leg pain.  Her history and physical examination are  most consistent with myofascial pain coming from gluteus maximus,  piriformis and tight ITB/TFL. Pt educated regarding diagnoses and  treatment options, and tiers of treatment discussed. Trigger  point injections were performed today, see procedure note below.  Patient will track her symptoms in physical therapy, continue  Grastont technique and consider addition of dry needling. She  will f/u in 4 wks to see if injections helped at which time, we  can consider repeating if she got some relief. All questions  answered. A total of 50 minutes was spent preparing for and  during this encounter including: review of imaging, performing  medically appropriate examination, counseling/education regarding  treatment plans/injections and clinical documentation into the  electronic health record. Thank you for letting me participate in  this patient's care.  [name]  PROCEDURES  .  Myofascial trigger point injectionsProcedure  performed by: Patrick North, DOInjectate: 10ml total volume  containing 4ml bupivacaine 0.25% with 6ml lidocaine 1% without  epinephrineAfter risks, benefits and alternative treatment  options and prognosis were discussed, the patient signed informed  consent. The patient's upper back was examined for trigger  points, with 4 total points found in the following muscles: right  gluteus maximus, piriformis, TFL, GTB. These trigger points were  cleaned and prepped using alcohol. A 25G 1.5" needle was then  inserted and after negative aspiration, the above injectate was  delivered with subsequent dry needling at each identified. The  patient tolerated the procedure well and there  were no  complications, she was given post-procedure instructions before  discharge.  .  FOLLOW UP  .  4 Weeks  .  Electronically signed by [name] on 07/03/2019  at 01:43 PM EDT  .  Document electronically signed by [name]    .

## 2019-07-03 NOTE — Progress Notes (Signed)
 [name], [name] **DOB:** Dec 17, 1960 (59 yo F) **Acc No.** [id number] **DOS:**  07/03/2019    ---       [name], Danice [name]**    ------    [name], DOB: [birth year], External MRN: [id number]    Account Number: [id number]    [address], [place], [place]    Home: [phone]    Guarantor: [name] Insurance: [id number] [employer] PPO    PCP: Ruffin Frederick Referring: Ruffin Frederick External Visit ID: [id number]    [facility]        * * *    07/03/2019 Progress Notes: Doristine MangoCHN#:** [id number]    ------    ---       **Current Medications**    ---    Taking    * Acetaminophen 500 MG Tablet 2 tablets as needed Orally every 8 hrs    ---    * Baclofen 10 MG Tablet 1/2 tab Orally prn, Notes: prn    ---    * Calcium Carbonate-Vitamin D 600-200 MG-UNIT Capsule 1 capsule with a meal Orally Twice a day    ---    * Famotidine 40 MG Tablet 1 tablet at bedtime Orally Once a day, Notes: prn    ---    * Gammagard - Solution as directed Injection once every 4 weeks, Notes: every 4 weeks    ---    * GlipiZIDE XL 5 MG Tablet Extended Release 24 Hour 1 tablet Orally Once a day    ---    * Glucophage XR 500 mg Tablet Extended Release 24 Hour 4 tablets Orally Once a day    ---    * Hydroxychloroquine Sulfate 200 MG Tablet 1 tablet with food or milk Orally Once a day    ---    * Jardiance 10 MG Tablet 1 tablet Orally Once a day    ---    * Lidocaine 5 % Patch 1 patch remove after 12 hours Externally Once a day, Notes: prn    ---    * Lisinopril 2.5 MG Tablet 1 tablet Orally Once a day    ---    * Omeprazole 40 mg Capsule Delayed Release 1 capsule Orally Once a day    ---    * Oxycodone HCl 5 MG Tablet 1 tablet as needed Orally every 4-6 hrs    ---    * PredniSONE 10 MG Tablet 2-3 tablet Orally Once a day    ---    * Rituxan 500 MG/50ML Solution as directed Intravenous , Notes: Last dose 05/19/19    ---    * Rosuvastatin Calcium 10 mg Tablet 1 tablet Orally Once a day    ---    * Tramadol HCl 50 MG  Tablet 1 tablet Orally every 6 hrs, Notes: PRN - rarely takes    ---    * Vitamin B Complex - Tablet 1 tablet Orally once daily    ---    * Vitamin D 25 MCG (1000 UT) Tablet 1 tablet Orally Once a day    ---    * Voltaren 1 % Gel 2 grams Transdermal twice daily    ---    * Medication List reviewed and reconciled with the patient    ---     Past Medical History    ---      Scleroderma - CREST dx 2007.        ---  IgG4 deficiency s/p IVIG 2010 ----single infusion given preventively after  week of bilateral knee replacements at [facility] 2010.        ---    Fx left wrist in 1994.        ---    Blood clot at LUE in 2006 on a short course of Coumadin.        ---    Bilateral carpal tunnel syndrome s/p Lt carpal tunnel release and steroids  injection right--.        ---    Bone spur left foot.        ---    Scoliosis.        ---    Spinal stenosis s/p steroids injection.        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 Katrinka Blazing 2010--- never infected, but packed with antibiotics with  surgery;.        ---    Right rotator cuff repairs x 4, complicated by repeat tears, infection,  placement of anchor material.        ---    Elevated liver function tests.        ---    Diabetes.        ---    Seronegative RA.        ---    GI bleed .        ---    GERD.        ---      **Surgical History**    ---      Right rotator cuff repair, with infected hardware that had to be removed  2006    ---    Repeat right shoulder surgery, also which became infected. 2007    ---    Left rotator cuff repair 2008    ---    bilateral knee replacements 2009    ---    Left arthroscopic carpal tunnel release 01/2011    ---    ORIF Left 4th metatarsal bone    ---    Left Ulna shortening 1994    ---    Knee replacement 2010    ---    Hand surgery 2013, 2015    ---    remove gallbladder/hernia 1990    ---    Plate left foot 3rd metatarsal 2008    ---    Left reverse TSA 03/07/16    ---      **Family  History**    ---      Mother: deceased 10 yrs, lung cancer, hyperthyroidism, diagnosed with Other  malignant neoplasm of unspecified site    ---    Father: deceased 75s yrs, heart attack, Unspecified heart disease    ---    Siblings: alive, Brother - scleroderma, COPD    ---    3 brother(s) .    ---    Denies family history of GI cancer.    ---      **Social History**    ---    Tobacco history: Never smoked.    Marital Status  Single.    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    Alcohol  Former daily EtOH use in 20s.    Abuse/Neglect Do you feel unsafe in your relationships? No, Have you ever been  hit, kicked, punched or otherwise hurt by someone in the past year? No, Plan  Resources Provided, Patient states they feel safe to  return home.    Illicit drugs: Denies.  Nonsmoker.    Lives with longstanding boyfriend.    ---      **Allergies**    ---      N.K.D.A.    ---    [name]]      **Hospitalization/Major Diagnostic Procedure**    ---      as above    ---     **Review of Systems**    ---    10 pt review notable for HPI.       **Reason for Appointment**    ---      1\. NP - Recent GI bleed    ---      **History of Present Illness**    ---     _Ambulatory Falls and Injury Prevention_ :    HPI Have you experienced a fall in the past year? No, Is the patient using  assistive devices such as a cane or walker? No, Do you need assistance with  ambulation while at our facility? No, Interventions none, patient not a fall  risk , ensured environment free from obstacles and obstructions .    _GENERAL_ :    [name] is a woman with history of scleroderma (CREST), IgG4 deficiency,  RA, on chronic steroids with prior treatment with Rituxan who presents for  evaluation of recent GI bleed.    Patient was admitted in 05/2018 to local hospital after episodes of melena. She  was found to be anemic and underwent EGD and colonoscopy which apparently  didn't show a source. She was noted to have  diverticulosis. She recieved 1  unit of blood and was monitored. There was no recurrence of bleeding so she  was discharged.    No recurrence of bleeding since. Has two solid brown stools per day. She  remains on Omeprazole 40mg  daily while taking prednisone and recent Rituxan.  Repeat labs showed normalization of blood counts. She denies any GI symptoms  at this time.    Has lost 60 lbs over the past few years but this was intentional with improved  diet and exercise.    Has not had recent follow up regarding chronically elevated LFTS. She denies  any liver related symptoms.      **Vital Signs**    ---    Ht-in 62, Wt-lbs 156.6, BMI 28.64, BP 126/81, HR 102, BSA 1.76, O2 98%ra, Wt-  kg 71.03.      **Physical Examination**    ---     _GENERAL_ :    General Appearance: Well developed, well hydrated, no acute distress.  Mood/Affect: Mood and affect within normal limits.[name] HEENT moist oral mucosa,  neck supple. Neck No carotid bruits, supple. Skin Normal, there is no rash or  other skin lesions noted. Cardiovascular regular rate and rhythm, normal S1  S2, no murmurs, extremities warm without swelling. Lungs Clear to  auscultation; no wheezes, rhonchi or rales. Abdomen Soft, nontender,  nondistended, no organmegaly, normoactive bowel sounds, no peritoneal signs.  Extremities warm and well perfused. Neuro alert and oriented, exam grossly  intact. Digital rectal exam Deferred.         **Assessments**    ---    1\. Upper GI bleeding - K92.2 (Primary)    ---    2\. Scleroderma - M34.9    ---    3\. Abnormal LFTs - R79.89    ---     [name] presents today for concern of recent GI bleed. She no  longer has  melena and recent CBC has normalized so low suspicion for ongoing bleeding.  EGD and colonoscopy at OSH was unable to find source. Possible it may have  been due to mid gut source such as AVM. Will get outside records and consider  video capsule study. This may be lower yield without active bleeding but  could  potentially rule out midgut scleroderma or masses. For now can continue on  antiacid therapy.    Will reach out to her once we have her outside records. Should melena return  would consider push enteroscopy with VCE. Will also refer her to hepatology  for ongoing LFT abnormality.    ---      **Treatment**    ---      **1\. Upper GI bleeding**    Notes: 1) Request outside EGD and colonoscopy records    2) Refer to hepatology clinic for chronically elevated LFTs.    ---     **Follow Up**    ---    3 Months    Electronically signed by Doristine Mango , MD on 07/03/2019 at 11:50 PM EDT    Sign off status: Completed        * * *        [facility]    [address] [place], [address]    [place], [place] [address]    Tel: [phone]    Fax: [phone]              * * *          Progress Note: Doristine Mango 07/03/2019    ---    Note generated by eClinicalWorks EMR/PM Software ([email or web address])

## 2019-07-03 NOTE — Progress Notes (Signed)
* * *      [name], [name] **DOB:** [birth year] (59 yo F) **Acc No.** [id number] **DOS:**  07/03/2019    ---       [name], [name]**    ------    [name], DOB: 05-Apr-1960    [address], [place], [place] [address]    Home: [phone]    Provider: Doristine Mango        * * *    Telephone Encounter    ---    Answered by  Felecia Jan Date: 07/03/2019       Time: 12:38 PM    Reason  CHECKOUT LIST    ------            Message                     1.FU BOOKED      2.HEPATOLOGY BOOKED      3.MED RECORDS REQ SENT                 Action Taken                     [facility]  07/03/2019 12:38:54 PM >                     * * *                ---          * * *         Provider: Doristine Mango 07/03/2019    ---    Note generated by eClinicalWorks EMR/PM Software ([email or web address])

## 2019-07-03 NOTE — Progress Notes (Signed)
 [name] [name], [name] [name] **DOB:** Oct 03, 1960 (59 yo F) **Acc No.** [id number] **DOS:**  07/03/2019    ---       [name] [name], [name] [name]**    ------    59 Y old Female, DOB: 1960/04/12, External MRN: [id number]    Account Number: [id number]    [address], [place], [id number]    Home: [phone]    Guarantor: [name] Insurance: [id number] PPO    PCP: Ruffin Frederick Referring: Ruffin Frederick External Visit ID: [id number]    [facility]        * * *    07/03/2019 Progress Notes: [name] [name] **CHN#:** [id number]    ------    ---       **Current Medications**    ---    Taking    * Acetaminophen 500 MG Tablet 2 tablets as needed Orally every 8 hrs    ---    * Baclofen 10 MG Tablet 1/2 tab Orally prn, Notes: prn    ---    * Calcium Carbonate-Vitamin D 600-200 MG-UNIT Capsule 1 capsule with a meal Orally Twice a day    ---    * Famotidine 40 MG Tablet 1 tablet at bedtime Orally Once a day, Notes: prn    ---    * Gammagard - Solution as directed Injection once every 4 weeks, Notes: every 4 weeks    ---    * GlipiZIDE XL 5 MG Tablet Extended Release 24 Hour 1 tablet Orally Once a day    ---    * Glucophage XR 500 mg Tablet Extended Release 24 Hour 4 tablets Orally Once a day    ---    * Hydroxychloroquine Sulfate 200 MG Tablet 1 tablet with food or milk Orally Once a day    ---    * Jardiance 10 MG Tablet 1 tablet Orally Once a day    ---    * Lidocaine 5 % Patch 1 patch remove after 12 hours Externally Once a day, Notes: prn    ---    * Lisinopril 2.5 MG Tablet 1 tablet Orally Once a day    ---    * Omeprazole 40 mg Capsule Delayed Release 1 capsule Orally Once a day    ---    * Oxycodone HCl 5 MG Tablet 1 tablet as needed Orally every 4-6 hrs    ---    * PredniSONE 10 MG Tablet 2-3 tablet Orally Once a day    ---    * Rituxan 500 MG/50ML Solution as directed Intravenous , Notes: Last dose 05/19/19    ---    * Rosuvastatin Calcium 10 mg Tablet 1 tablet Orally Once a day    ---    *  Tramadol HCl 50 MG Tablet 1 tablet Orally every 6 hrs, Notes: PRN - rarely takes    ---    * Vitamin B Complex - Tablet 1 tablet Orally once daily    ---    * Vitamin D 25 MCG (1000 UT) Tablet 1 tablet Orally Once a day    ---    Discontinued    * Voltaren 1 % Gel 2 grams Transdermal twice daily    ---    * Medication List reviewed and reconciled with the patient    ---     Past Medical History    ---      Scleroderma - CREST dx 2007.        ---  IgG4 deficiency s/p IVIG 2010 ----single infusion given preventively after  week of bilateral knee replacements at [facility] 2010.        ---    Fx left wrist in 1994.        ---    Blood clot at LUE in 2006 on a short course of Coumadin.        ---    Bilateral carpal tunnel syndrome s/p Lt carpal tunnel release and steroids  injection right--.        ---    Bone spur left foot.        ---    Scoliosis.        ---    Spinal stenosis s/p steroids injection.        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 Katrinka Blazing 2010--- never infected, but packed with antibiotics with  surgery;.        ---    Right rotator cuff repairs x 4, complicated by repeat tears, infection,  placement of anchor material.        ---    Elevated liver function tests.        ---    Diabetes.        ---    Seronegative RA.        ---    GI bleed .        ---    GERD.        ---      **Surgical History**    ---      Right rotator cuff repair, with infected hardware that had to be removed  2006    ---    Repeat right shoulder surgery, also which became infected. 2007    ---    Left rotator cuff repair 2008    ---    bilateral knee replacements 2009    ---    Left arthroscopic carpal tunnel release 01/2011    ---    ORIF Left 4th metatarsal bone    ---    Left Ulna shortening 1994    ---    Knee replacement 2010    ---    Hand surgery 2013, 2015    ---    remove gallbladder/hernia 1990    ---    Plate left foot 3rd metatarsal 2008    ---    Left reverse TSA  03/07/16    ---      **Family History**    ---      Mother: deceased 77 yrs, lung cancer, hyperthyroidism, diagnosed with Other  malignant neoplasm of unspecified site    ---    Father: deceased 58s yrs, heart attack, Unspecified heart disease    ---    Siblings: alive, Brother - scleroderma, COPD    ---    3 brother(s) .    ---    Denies family history of GI cancer.    ---      **Social History**    ---    Tobacco history: Never smoked.    Marital Status  Single.    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    Alcohol  Former daily EtOH use in 20s.    Abuse/Neglect Do you feel unsafe in your relationships? No, Plan Resources  Provided, Patient states they feel safe to return home, Have you ever been  hit, kicked, punched or otherwise hurt by someone in the past  year? No.    Illicit drugs: Denies.  Nonsmoker.    Lives with longstanding boyfriend.    ---      **Allergies**    ---      N.K.D.A.    ---    [name]]      **Hospitalization/Major Diagnostic Procedure**    ---      as above    ---     **Review of Systems**    ---     _ORT_ :    Eyes No. Ear, Nose Throat No. Digestion, Stomach, Bowel No. Bladder Problems  No. Bleeding Problems No. Numbness/Tingling No. Anxiety/Depression No.  Fever/Chills/Fatigue No. Chest Pain/Tightness/Palpitations No. Skin Rash No.  Dental Problems No. Joint/Muscle Pain/Cramps Yes. Blackout/Fainting No. Other  No.          **Reason for Appointment**    ---      1\. RT HIP LOWER BACK PAIN    ---      **History of Present Illness**    ---     _General_ :    Referred by Dr Leandro Reasoner.    I had the pleasure of seeing [name] [name] for initial evaluation of right  sided buttock and lateral leg pain. She had a left proximal hamstring repair  in January 2021 and is doing well with her left leg but has had persistent  pain on her right side without any particular inciting event or trauma. She  points to the area of the GTB and along the ITB. It does not radiate  past the  knee. She has been working with physical therapy and doing exercises, Graston  technique, rolling, etc., all of which is helping, but progress has been slow  and frustrating. She denies any true low back pain or weakness, numbness or  tingling into the right leg. She denies any new bowel or bladder dysfunction.  She presents today for consideration of trigger point injections into her  areas of pain.      **Vital Signs**    ---    Pain scale 5, Ht-in 62, Wt-lbs 156, BMI 28.53, BSA 1.76, Ht-cm 157.48, Wt-kg  70.76, Wt Change -.6 lb.      **Physical Examination**    ---    General: NAD    Psych: Mood and affect appropriate    HEENT: NC/AT    CV: warm and well perfused    Pulm: breathing unlabored, normal chest expansions    Skin: No rash, swelling, ecchymoses, or erythema    Gait: not antalgic; able to heel, toe and tandem walk    +TTP over right gluteus maximus, right piriformis, right TFL, GTB.      **Assessments**    ---    1\. Trochanteric bursitis, right hip - M70.61 (Primary)    ---    2\. Iliotibial band syndrome of right side - M76.31    ---    3\. 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  Her history and physical examination are most consistent with myofascial pain  coming from gluteus maximus, piriformis and tight ITB/TFL. Pt educated  regarding diagnoses and treatment options, and tiers of treatment discussed.  Trigger point injections were performed today, see procedure note below.  Patient will track her symptoms in physical therapy, continue Grastont  technique and  consider addition of dry needling. She will f/u in 4 wks to see  if injections helped at which time, we can consider repeating if she got some  relief. All questions answered.    A total of 50 minutes was spent preparing for and during this encounter  including:  review of imaging, performing medically appropriate examination,  counseling/education regarding treatment plans/injections and clinical  documentation into the electronic health record.    Thank you for letting me participate in this patient's care.    ---      **Procedures**    ---    Myofascial trigger point injections    Procedure performed by: Patrick North, DO    Injectate: 10ml total volume containing 4ml bupivacaine 0.25% with 6ml  lidocaine 1% without epinephrine    After risks, benefits and alternative treatment options and prognosis were  discussed, the patient signed informed consent. The patient's upper back was  examined for trigger points, with 4 total points found in the following  muscles: right gluteus maximus, piriformis, TFL, GTB. These trigger points  were cleaned and prepped using alcohol. A 25G 1.5" needle was then inserted  and after negative aspiration, the above injectate was delivered with  subsequent dry needling at each identified. The patient tolerated the  procedure well and there were no complications, she was given post-procedure  instructions before discharge.      **Follow Up**    ---    4 Weeks    Electronically signed by [name] [name] on 07/03/2019 at 01:43 PM EDT    Sign off status: Completed        * * *        [facility]    [address]    [place] [address]    [place], [place] [address]    Tel: [phone]    Fax: [phone]              * * *          Progress Note: Tavaris Eudy [facility] 07/03/2019    ---    Note generated by eClinicalWorks EMR/PM Software ([email or web address])

## 2019-07-03 NOTE — Progress Notes (Signed)
 .  Progress Notes  .  Patient: [name], PRAK [facility]  Provider: Doristine Mango    .  DOB:[birth year] Age: 59 Y Sex: Female  .  PCP: Ruffin Frederick    Date: 07/03/2019  .  --------------------------------------------------------------------------------  .  REASON FOR APPOINTMENT  .  1. NP - Recent GI bleed  .  HISTORY OF PRESENT ILLNESS  .  Ambulatory Falls and Injury Prevention:  HPI  .  Have you experienced a fall in the past year?No , Is the patient  using assistive devices such as a cane or walker?No , Do you need  assistance with ambulation while at our facility?No ,  Interventionsnone, patient not a fall risk , ensured environment  free from obstacles and obstructions  .  [name]  GENERAL:  [name] is a woman with history of  scleroderma (CREST), IgG4 deficiency, RA, on chronic steroids  with prior treatment with Rituxan who presents for evaluation of  recent GI bleed.Patient was admitted in 05/2018 to local hospital  after episodes of melena. She was found to be anemic and  underwent EGD and colonoscopy which apparently didn't show a  source. She was noted to have diverticulosis. She recieved 1 unit  of blood and was monitored. There was no recurrence of bleeding  so she was discharged. No recurrence of bleeding since. Has two  solid brown stools per day. She remains on Omeprazole 40mg  daily  while taking prednisone and recent Rituxan. Repeat labs showed  normalization of blood counts. She denies any GI symptoms at this  time.Has lost 60 lbs over the past few years but this was  intentional with improved diet and exercise.Has not had recent  follow up regarding chronically elevated LFTS. She denies any  liver related symptoms.  .  CURRENT MEDICATIONS  .  Taking Acetaminophen 500 MG Tablet 2 tablets as needed Orally  every 8 hrs  Taking Baclofen 10 MG Tablet 1/2 tab Orally prn, Notes: prn  Taking Calcium Carbonate-Vitamin D 600-200 MG-UNIT Capsule 1  capsule with a meal Orally Twice a day  Taking Famotidine 40 MG Tablet 1  tablet at bedtime Orally Once a  day, Notes: prn  Taking Gammagard - Solution as directed Injection once every 4  weeks, Notes: every 4 weeks  Taking GlipiZIDE XL 5 MG Tablet Extended Release 24 Hour 1 tablet  Orally Once a day  Taking Glucophage XR 500 mg Tablet Extended Release 24 Hour 4  tablets Orally Once a day  Taking Hydroxychloroquine Sulfate 200 MG Tablet 1 tablet with  food or milk Orally Once a day  Taking Jardiance 10 MG Tablet 1 tablet Orally Once a day  Taking Lidocaine 5 % Patch 1 patch remove after 12 hours  Externally Once a day, Notes: prn  Taking Lisinopril 2.5 MG Tablet 1 tablet Orally Once a day  Taking Omeprazole 40 mg Capsule Delayed Release 1 capsule Orally  Once a day  Taking Oxycodone HCl 5 MG Tablet 1 tablet as needed Orally every  4-6 hrs  Taking PredniSONE 10 MG Tablet 2-3 tablet Orally Once a day  Taking Rituxan 500 MG/50ML Solution as directed Intravenous ,  Notes: Last dose 05/19/19  Taking Rosuvastatin Calcium 10 mg Tablet 1 tablet Orally Once a  day  Taking Tramadol HCl 50 MG Tablet 1 tablet Orally every 6 hrs,  Notes: PRN - rarely takes  Taking Vitamin B Complex - Tablet 1 tablet Orally once daily  Taking Vitamin D 25 MCG (1000 UT) Tablet 1  tablet Orally Once a  day  Taking Voltaren 1 % Gel 2 grams Transdermal twice daily  Medication List reviewed and reconciled with the patient  .  PAST MEDICAL HISTORY  .  Scleroderma - CREST dx 2007  IgG4 deficiency s/p IVIG 2010 ----single infusion given  preventively after week of bilateral knee replacements at [facility]  2010  Fx left wrist in 1994  Blood clot at LUE in 2006 on a short course of Coumadin  Bilateral carpal tunnel syndrome s/p Lt carpal tunnel release and  steroids injection right--  Bone spur left foot  Scoliosis  Spinal stenosis s/p steroids injection  s/p bilateral knee replacements for valgus /arthritic  complications, performed by Dr. Katrinka Blazing 2010--- never infected, but  packed with antibiotics with surgery;  Right rotator cuff  repairs x 4, complicated by repeat tears,  infection, placement of anchor material  Elevated liver function tests  Diabetes  Seronegative RA  GI bleed  GERD  .  ALLERGIES  .  N.K.D.A.  .  SURGICAL HISTORY  .  Right rotator cuff repair, with infected hardware that had to be  removed 2006  Repeat right shoulder surgery, also which became infected. 2007  Left rotator cuff repair 2008  bilateral knee replacements 2009  Left arthroscopic carpal tunnel release 01/2011  ORIF Left 4th metatarsal bone  Left Ulna shortening 1994  Knee replacement 2010  Hand surgery 2013, 2015  remove gallbladder/hernia 1990  Plate left foot 3rd metatarsal 2008  Left reverse TSA 03/07/16  .  FAMILY HISTORY  .  Mother: deceased 34 yrs, lung cancer, hyperthyroidism, diagnosed  with Other malignant neoplasm of unspecified site  Father: deceased 38s yrs, heart attack, Unspecified heart disease  Siblings: alive, Brother - scleroderma, COPD  3 brother(s) .  Denies family history of GI cancer.  .  SOCIAL HISTORY  .  .  Tobacco  history: Never smoked.  .  Marital Status Single.  .  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  .  Alcohol Former daily EtOH use in 20s.  .  Abuse/NeglectDo you feel unsafe in your relationships?No , Have  you ever been hit, kicked, punched or otherwise hurt by someone  in the past year? No , PlanResources Provided, Patient states  they feel safe to return home.  .  Illicit drugs: Denies.  .  Nonsmoker.Lives with longstanding boyfriend.  [name]  HOSPITALIZATION/MAJOR DIAGNOSTIC PROCEDURE  .  as above  .  REVIEW OF SYSTEMS  .  10 pt review notable for HPI.  [name]  VITAL SIGNS  .  Ht-in 62, Wt-lbs 156.6, BMI 28.64, BP 126/81, HR 102, BSA 1.76,  O2 98%ra, Wt-kg 71.03.  .  PHYSICAL EXAMINATION  .  GENERAL:  General Appearance:  Well developed, well hydrated, no acute  distress. Mood/Affect:  Mood and affect within normal limits.[name]  HEENT  moist oral mucosa, neck supple. Neck  No carotid bruits,  supple. Skin  Normal, there is no  rash or other skin lesions  noted. Cardiovascular  regular rate and rhythm, normal S1 S2, no  murmurs, extremities warm without swelling. Lungs  Clear to  auscultation; no wheezes, rhonchi or rales. Abdomen  Soft,  nontender, nondistended, no organmegaly, normoactive bowel  sounds, no peritoneal signs. Extremities  warm and well perfused.  Neuro  alert and oriented, exam grossly intact. Digital rectal  exam  Deferred.  .  ASSESSMENTS  .  Upper GI bleeding - K92.2 (Primary)  .  Scleroderma - M34.9  .  Abnormal LFTs - R79.89  .  [name] presents today for concern of recent GI bleed. She no  longer has melena and recent CBC has normalized so low suspicion  for ongoing bleeding. EGD and colonoscopy at OSH was unable to  find source. Possible it may have been due to mid gut source such  as AVM. Will get outside records and consider video capsule  study. This may be lower yield without active bleeding but could  potentially rule out midgut scleroderma or masses. For now can  continue on antiacid therapy.Will reach out to her once we have  her outside records. Should melena return would consider push  enteroscopy with VCE. Will also refer her to hepatology for  ongoing LFT abnormality.  .  TREATMENT  .  Upper GI bleeding  Notes: 1) Request outside EGD and colonoscopy records  2) Refer to hepatology clinic for chronically elevated LFTs.  .  FOLLOW UP  .  3 Months  .  Electronically signed by Doristine Mango , MD on  07/03/2019 at 11:50 PM EDT  .  Document electronically signed by Doristine Mango    .

## 2019-07-04 ENCOUNTER — Ambulatory Visit

## 2019-07-04 ENCOUNTER — Ambulatory Visit: Admitting: Rheumatology

## 2019-07-04 ENCOUNTER — Ambulatory Visit: Admit: 2019-07-04

## 2019-07-04 MED ORDER — Mycophenolate Mofetil: 500 | 120 | Freq: Two times a day (BID) | 3 refills | 0 days | Status: AC

## 2019-07-04 NOTE — Progress Notes (Signed)
 .  Progress Notes  .  Patient: [name], [name] [facility]  Provider: [name]  DOB:[birth year] Age: 59 Y Sex: Female  .  PCP: Ruffin Frederick    Date: 07/04/2019  .  --------------------------------------------------------------------------------  .  HISTORY OF PRESENT ILLNESS  .  GENERAL:   59 year old female here for f/u for scleroderma. She is feeling  much better than last time I saw her, but still having  significant pain.Currently taking:Prednisone-20mg /10mg   alternating-Denies current SEs (did have tendon rupture last  month, see below)Last Rituxin in February 1st and 16th-We have  held off on contiuing this because it does not seem to be  working. Last visit we discussed the following options:  Cyclophsophamide and Mycophenolate.:Her biggest concern today is  her hand swelling and pain. No cracks or ulcers recently. Weather  being warm helps. See below for her other provider and summary of  recent care: GI: They do not see any evidence of bleed right now  on labs, scopes in hospital are being sent to them and [place]. No  need for capsule at this time. LFTs elevated (worked up many  years ago by GI and told it was fatty liver). She will be seeing  a hepatologist for another evaluation next week. F/u as needed or  in 3 months.Ortho: Left hamstring tear 05/2019-recovering  wellRight IT band is tight. Concern that she may be at risk for  quad rupture. Needs to build the muscle so she is seeing  Physiatrist adn they are doing-Trigger points/injections with  lidocaine-piriformis, gluteus maximus, tensor fascia lata  massage/stretching/strengthening. Cards: Stress test coming up  here-nuclear medHeart monitor-Pt wore for two weeks so will get  results at follow upChronic blood clot in right  femoralASDPulm-PFT less than a year agoEye doctor end of this  monthFollows with PCPCOVID vaccine both doses. No adverse  effects.  [name]  PAST RESULTS:   Most recent labs from 06/20/19:-Glucose 194-BUN/Cr, GFR and lytes  WNL.-Alk 302  (baseline), ALT 92, AST, billi normla.-Alb/Cr random  urine WNL.-Sed rate 32, CRP normal-Ca, ferritin, TSH, Ca all  normal-MCV mildly low at 99.5, Remainder of CBC W/diff WNL.  [name]  CURRENT MEDICATIONS  .  Taking Acetaminophen 500 MG Tablet 2 tablets as needed Orally  every 8 hrs  Taking Baclofen 10 MG Tablet 1/2 tab Orally prn, Notes: prn  Taking Calcium Carbonate-Vitamin D 600-200 MG-UNIT Capsule 1  capsule with a meal Orally Twice a day  Taking Famotidine 40 MG Tablet 1 tablet at bedtime Orally Once a  day, Notes: prn  Taking Gammagard - Solution as directed Injection once every 4  weeks, Notes: every 4 weeks  Taking GlipiZIDE XL 5 MG Tablet Extended Release 24 Hour 1 tablet  Orally Once a day  Taking Glucophage XR 500 mg Tablet Extended Release 24 Hour 4  tablets Orally Once a day  Taking Hydroxychloroquine Sulfate 200 MG Tablet 1 tablet with  food or milk Orally Once a day  Taking Jardiance 10 MG Tablet 1 tablet Orally Once a day  Taking Lidocaine 5 % Patch 1 patch remove after 12 hours  Externally Once a day, Notes: prn  Taking Lisinopril 2.5 MG Tablet 1 tablet Orally Once a day  Taking Omeprazole 40 mg Capsule Delayed Release 1 capsule Orally  Once a day  Taking Oxycodone HCl 5 MG Tablet 1 tablet as needed Orally every  4-6 hrs  Taking PredniSONE 10 MG Tablet 2-3 tablet Orally Once a day  Taking Rituxan  500 MG/50ML Solution as directed Intravenous ,  Notes: Last dose 05/19/19  Taking Rosuvastatin Calcium 10 mg Tablet 1 tablet Orally Once a  day  Taking Tramadol HCl 50 MG Tablet 1 tablet Orally every 6 hrs,  Notes: PRN - rarely takes  Taking Vitamin B Complex - Tablet 1 tablet Orally once daily  Taking Vitamin D 25 MCG (1000 UT) Tablet 1 tablet Orally Once a  day  Medication List reviewed and reconciled with the patient  .  PAST MEDICAL HISTORY  .  Scleroderma - CREST dx 2007  IgG4 deficiency s/p IVIG 2010 ----single infusion given  preventively after week of bilateral knee replacements at [facility]  2010  Fx left  wrist in 1994  Blood clot at LUE in 2006 on a short course of Coumadin  Bilateral carpal tunnel syndrome s/p Lt carpal tunnel release and  steroids injection right--  Bone spur left foot  Scoliosis  Spinal stenosis s/p steroids injection  s/p bilateral knee replacements for valgus /arthritic  complications, performed by Dr. Katrinka Blazing 2010--- never infected, but  packed with antibiotics with surgery;  Right rotator cuff repairs x 4, complicated by repeat tears,  infection, placement of anchor material  Elevated liver function tests  Diabetes  Seronegative RA  GI bleed  GERD  .  ALLERGIES  .  N.K.D.A.  .  SURGICAL HISTORY  .  Right rotator cuff repair, with infected hardware that had to be  removed 2006  Repeat right shoulder surgery, also which became infected. 2007  Left rotator cuff repair 2008  bilateral knee replacements 2009  Left arthroscopic carpal tunnel release 01/2011  ORIF Left 4th metatarsal bone  Left Ulna shortening 1994  Knee replacement 2010  Hand surgery 2013, 2015  remove gallbladder/hernia 1990  Plate left foot 3rd metatarsal 2008  Left reverse TSA 03/07/16  Left Hamstring Repair 05/2019  .  FAMILY HISTORY  .  Mother: deceased 92 yrs, lung cancer, hyperthyroidism, diagnosed  with Other malignant neoplasm of unspecified site  Father: deceased 69s yrs, heart attack, Unspecified heart disease  Siblings: alive, Brother - scleroderma, COPD  3 brother(s) .  Denies family history of GI cancer.  .  SOCIAL HISTORY  .  .  Tobacco  history: Never smoked.  .  Marital Status Single.  .  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  .  Alcohol Former daily EtOH use in 20s.  .  Abuse/NeglectDo you feel unsafe in your relationships?No ,  PlanResources Provided, Patient states they feel safe to return  home , Have you ever been hit, kicked, punched or otherwise hurt  by someone in the past year? No.  .  Illicit drugs: Denies.  .  Nonsmoker.Lives with longstanding boyfriend.  [name]  HOSPITALIZATION/MAJOR DIAGNOSTIC  PROCEDURE  .  as above  GI bleed 05/2019  .  REVIEW OF SYSTEMS  .  Rheumatology:  .  General    No fever, weight loss, swollen glands . Muscoskeletal     +Joint pain, +joint swelling, +morning stiffness  . Eyes    No  vision loss, eye dry, red eye, eye pain . Mouth    No mouth sores  . Cardiovascular    ? raynaud, no chest pain , irregular heart  beat, racing heart beat, leg swellingUpcoming Stress Test .  Pulmonary    No cough, cough blood, shortness of breath . Skin     No rash, photosensitivity . Lymphatics    No swollen glands,  tender glands .  [name]  VITAL SIGNS  .  Pain scale 5, Ht-in 62, Wt-lbs 156, BMI 28.53, BP 142/90, HR 70,  BSA 1.76, O2 99, Ht-cm 157.48, Wt-kg 70.76.  [name]  EXAMINATION  .  Rheumatology:  General Appearance: Alert and oriented , No apparent distress .  Eyes: No, scleral icterus, scleral erythema .  Pulm:Clear to auscultation bilaterally, no  crackles/wheezes/rhonchi.  Abdomen Soft, nondistended.  Skin: No rashes or nail changes.  MuskuloskeletalElbows, shoulders, hips, knees have intact ROM  with no synovitis. Hands have synovitis in the 2-3 PIPs and MCPs  bilaterally. Positive MCP squeeze b/l. Heberden's node to R2 DIP.  Skin and Nails Minimal sclerodactyly, no cracking atthe  fingertips, no ulcers.  .  ASSESSMENTS  .  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  Scleroderma - M34.9  .  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  Long-term use of high-risk medication - Z79.899  .  She continues to do poorly, and is now far enough out from  rituximab dose that it should be working. We discussed  alternatives including cyclophsophamide and mycophenolate. I have  been hesitant to pursue these given her CVID however the monthly  IVIG infusions have kept her infection free. She has now had  multiple complications from long term steroid use including  tendon ruptures, muscle atrophy,and possible GI bleed. Therefore  I think at this point the risk of steroids outweighs the risk  of  other immunosuppressants. She will also continue monthly IVIG  infusions to treat CVID. She is at high risk for infection with  the treatments plus CVID, especially during the pandemic. I  encouraged her to maintain safe practices during the pandemic.  Plan:1. Start mycophenolate. Risks and benefits reviewed. Start  at 500mg  daily for 1 week then 500mg  BID then check in with [place].  Target dose is 2000 mg BID2. Continue GI, Cards, Pulm,  Hepatology, and PCP.3. Most recent labs WNL.4. F/u 2 weeks tele  med.  .  TREATMENT  .  Seronegative rheumatoid arthritis  Start Mycophenolate Mofetil Tablet, 500 MG, 2 tablet, Orally,  twice daily, 30 day(s), 120, Refills 3  .  FOLLOW UP  .  2 Weeks  .  Electronically signed by Erasmo Leventhal , MD on  07/13/2019 at 09:59 AM EDT  .  Document electronically signed by [name]

## 2019-07-04 NOTE — Progress Notes (Signed)
 [name] [name], [name] [name] **DOB:** [birth year] (59 yo F) **Acc No.** [id number] **DOS:**  07/04/2019    ---       [name] [name], [name] [name]**    ------    [name], DOB: 04-21-1960, External MRN: [id number]    Account Number: [id number]    [address], [place], [address]    Home: [phone]    Guarantor: [name] [name] Insurance: [id number] PPO    PCP: Ruffin Frederick Referring: Ruffin Frederick    Appointment Facility: Rheumatology, Allergy and Immunology        * * *    07/04/2019 Progress Notes: [name] Pimple, MD **CHN#:** [id number]    ------    ---       **History of Present Illness**    ---     _GENERAL_ :    59 year old female here for f/u for scleroderma. She is feeling much better  than last time I saw her, but still having significant pain.    Currently taking:    Prednisone-20mg /10mg  alternating-Denies current SEs (did have tendon rupture  last month, see below)    Last Rituxin in February 1st and 16th-We have held off on contiuing this  because it does not seem to be working. Last visit we discussed the following  options: Cyclophsophamide and Mycophenolate.    :    Her biggest concern today is her hand swelling and pain. No cracks or ulcers  recently. Weather being warm helps.    See below for her other provider and summary of recent care:    GI: They do not see any evidence of bleed right now on labs, scopes in  hospital are being sent to them and [place]. No need for capsule at this time. LFTs  elevated (worked up many years ago by GI and told it was fatty liver). She  will be seeing a hepatologist for another evaluation next week. F/u as needed  or in 3 months.    Ortho: Left hamstring tear 05/2019-recovering well    Right IT band is tight. Concern that she may be at risk for quad rupture.  Needs to build the muscle so she is seeing Physiatrist adn they are doing-  Trigger points/injections with lidocaine-piriformis, gluteus maximus, tensor  fascia lata massage/stretching/strengthening.    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 No adverse effects.     _PAST RESULTS_ :    Most recent labs from 06/20/19:    -Glucose 194    -BUN/Cr, GFR and lytes WNL.    -Alk 302 (baseline), ALT 92, AST, billi normla.    -Alb/Cr random urine WNL.    -Sed rate 32, CRP normal    -Ca, ferritin, TSH, Ca all normal    -MCV mildly low at 99.5, Remainder of CBC W/diff WNL.      **Current Medications**    ---    Taking    * Acetaminophen 500 MG Tablet 2 tablets as needed Orally every 8 hrs    ---    * Baclofen 10 MG Tablet 1/2 tab Orally prn, Notes:  prn    ---    * Calcium Carbonate-Vitamin D 600-200 MG-UNIT Capsule 1 capsule with a meal Orally Twice a day    ---    * Famotidine 40 MG Tablet 1 tablet at bedtime Orally Once a day, Notes: prn    ---    * Gammagard - Solution as directed Injection once every 4 weeks, Notes: every 4 weeks    ---    * GlipiZIDE XL 5 MG Tablet Extended Release 24 Hour 1 tablet Orally Once a day    ---    * Glucophage XR 500 mg Tablet Extended Release 24 Hour 4 tablets Orally Once a day    ---    * Hydroxychloroquine Sulfate 200 MG Tablet 1 tablet with food or milk Orally Once a day    ---    * Jardiance 10 MG Tablet 1 tablet Orally Once a day    ---    * Lidocaine 5 % Patch 1 patch remove after 12 hours Externally Once a day, Notes: prn    ---    * Lisinopril 2.5 MG Tablet 1 tablet Orally Once a day    ---    * Omeprazole 40 mg Capsule Delayed Release 1 capsule Orally Once a day    ---    * Oxycodone HCl 5 MG Tablet 1 tablet as needed Orally every 4-6 hrs    ---    * PredniSONE 10 MG Tablet 2-3 tablet Orally Once a day    ---    * Rituxan 500 MG/50ML Solution as directed Intravenous , Notes: Last dose 05/19/19    ---    * Rosuvastatin Calcium 10 mg Tablet 1 tablet Orally Once a  day    ---    * Tramadol HCl 50 MG Tablet 1 tablet Orally every 6 hrs, Notes: PRN - rarely takes    ---    * Vitamin B Complex - Tablet 1 tablet Orally once daily    ---    * Vitamin D 25 MCG (1000 UT) Tablet 1 tablet Orally Once a day    ---    * Medication List reviewed and reconciled with the patient    ---      **Past Medical History**    ---      Scleroderma - CREST dx 2007.        ---    IgG4 deficiency s/p IVIG 2010 ----single infusion given preventively after  week of bilateral knee replacements at [facility] 2010.        ---    Fx left wrist in 1994.        ---    Blood clot at LUE in 2006 on a short course of Coumadin.        ---    Bilateral carpal tunnel syndrome s/p Lt carpal tunnel release and steroids  injection right--.        ---    Bone spur left foot.        ---    Scoliosis.        ---    Spinal stenosis s/p steroids injection.        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 Katrinka Blazing 2010--- never infected, but packed with antibiotics with  surgery;.        ---    Right rotator cuff repairs x 4, complicated by repeat tears, infection,  placement of anchor material.        ---  Elevated liver function tests.        ---    Diabetes.        ---    Seronegative RA.        ---    GI bleed .        ---    GERD.        ---      **Surgical History**    ---      Right rotator cuff repair, with infected hardware that had to be removed  2006    ---    Repeat right shoulder surgery, also which became infected. 2007    ---    Left rotator cuff repair 2008    ---    bilateral knee replacements 2009    ---    Left arthroscopic carpal tunnel release 01/2011    ---    ORIF Left 4th metatarsal bone    ---    Left Ulna shortening 1994    ---    Knee replacement 2010    ---    Hand surgery 2013, 2015    ---    remove gallbladder/hernia 1990    ---    Plate left foot 3rd metatarsal 2008    ---    Left reverse TSA 03/07/16    ---    Left Hamstring Repair 05/2019    ---       **Family History**    ---      Mother: deceased 93 yrs, lung cancer, hyperthyroidism, diagnosed with Other  malignant neoplasm of unspecified site    ---    Father: deceased 26s yrs, heart attack, Unspecified heart disease    ---    Siblings: alive, Brother - scleroderma, COPD    ---    3 brother(s) .    ---    Denies family history of GI cancer.    ---      **Social History**    ---    Tobacco history: Never smoked.    Marital Status  Single.    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    Alcohol  Former daily EtOH use in 20s.    Abuse/Neglect  Do you feel unsafe in your relationships? No, Plan Resources  Provided, Patient states they feel safe to return home, Have you ever been  hit, kicked, punched or otherwise hurt by someone in the past year? No.    Illicit drugs: Denies.  Nonsmoker.    Lives with longstanding boyfriend.    ---      **Allergies**    ---      N.K.D.A.    ---      **Hospitalization/Major Diagnostic Procedure**    ---      as above    ---    GI bleed 05/2019    ---      **Review of Systems**    ---     _Rheumatology_ :    General No fever, weight loss, swollen glands. Muscoskeletal +Joint pain,  +joint swelling, +morning stiffness . Eyes No vision loss, eye dry, red eye,  eye pain. Mouth No mouth sores. Cardiovascular ? raynaud, no chest pain ,  irregular heart beat, racing heart beat, leg swellingUpcoming Stress Test.  Pulmonary No cough, cough blood, shortness of breath. Skin No rash,  photosensitivity. Lymphatics No swollen glands, tender glands.         **Vital Signs**    ---    Pain scale 5, Ht-in 62,  Wt-lbs 156, BMI 28.53, BP 142/90, HR 70, BSA 1.76, O2  99, Ht-cm 157.48, Wt-kg 70.76.      **Examination**    ---     _Rheumatology:_    General Appearance:  Alert and oriented , No apparent distress .    Eyes:  No, scleral icterus, scleral erythema .    Pulm: Clear to auscultation bilaterally, no crackles/wheezes/rhonchi.    Abdomen  Soft, nondistended.    Skin:  No  rashes or nail changes.    Muskuloskeletal Elbows, shoulders, hips, knees have intact ROM with no  synovitis. Hands have synovitis in the 2-3 PIPs and MCPs bilaterally. Positive  MCP squeeze b/l. Heberden's node to R2 DIP.    Skin and Nails  Minimal sclerodactyly, no cracking atthe fingertips, no  ulcers.         **Assessments**    ---    1\. 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 Scleroderma - M34.9    ---    3\. 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 Long-term use of high-risk medication - Z79.899    ---     She continues to do poorly, and is now far enough out from rituximab dose  that it should be working. We discussed alternatives including  cyclophsophamide and mycophenolate. I have been hesitant to pursue these given  her CVID however the monthly IVIG infusions have kept her infection free.    She has now had multiple complications from long term steroid use including  tendon ruptures, muscle atrophy,and possible GI bleed. Therefore I think at  this point the risk of steroids outweighs the risk of other  immunosuppressants. She will also continue monthly IVIG infusions to treat  CVID. She is at high risk for infection with the treatments plus CVID,  especially during the pandemic. I encouraged her to maintain safe practices  during the pandemic.    Plan:    1\. Start mycophenolate. Risks and benefits reviewed. Start at 500mg  daily for  1 week then 500mg  BID then check in with [place]. Target dose is 2000 mg BID    2\. Continue GI, Cards, Pulm, Hepatology, and PCP.    3\. Most recent labs WNL.    4\. F/u 2 weeks tele med.    ---      **Treatment**    ---      **1\. Seronegative rheumatoid arthritis**    Start Mycophenolate Mofetil Tablet, 500 MG, 2 tablet, Orally, twice daily, 30  day(s), 120, Refills 3    ---      **Follow Up**    ---    2 Weeks    Electronically signed by Erasmo Leventhal , MD on 07/13/2019 at 09:59 AM EDT    Sign off status: Completed         * * *        Rheumatology, Allergy and Immunology    [address]    [address], [address] Floor    [place], [place] [address]    Tel: [phone]    Fax: [phone]              * * *          Progress Note: [name] Pimple, MD 07/04/2019    ---    Note generated by eClinicalWorks EMR/PM Software ([email or web address])

## 2019-07-07 ENCOUNTER — Ambulatory Visit

## 2019-07-14 ENCOUNTER — Ambulatory Visit: Admitting: Rheumatology

## 2019-07-14 NOTE — Progress Notes (Signed)
* * *      [name], [name] **DOB:** 21-May-1960 (59 yo F) **Acc No.** [id number] **DOS:**  07/14/2019    ---       [name], [name]**    ------    40 Y old Female, DOB: [birth year]    [address], [place], [place] [address]    Home: [phone]    Provider: [name]        * * *    Telephone Encounter    ---    Answered by  Denton Meek Date: 07/14/2019       Time: 02:12 PM    Reason  Disability    ------            Action Taken                     [facility]  07/14/2019 2:12:20 PM > LVMx1 for pt.  Long term disability.  Would like to fill out with her.  Will do so when she returns my call.      [name],[name]  07/21/2019 12:41:44 PM > Paperwork complete.  Waiting on [name] to sign the note.      [name],[name]  07/22/2019 11:28:46 AM > Faxed paperwork and note to F# provided.  Gave to [facility] to scan into chart.                    * * *                ---          * * *         Provider: [name] 07/14/2019    ---    Note generated by eClinicalWorks EMR/PM Software ([email or web address])

## 2019-07-16 ENCOUNTER — Ambulatory Visit

## 2019-07-16 ENCOUNTER — Ambulatory Visit: Admitting: Cardiovascular Disease

## 2019-07-16 ENCOUNTER — Ambulatory Visit: Admit: 2019-07-16

## 2019-07-16 MED ORDER — FAMOTIDINE: 1 | 90 | 2 refills | 0 days | Status: AC

## 2019-07-17 ENCOUNTER — Ambulatory Visit: Admitting: Cardiovascular Disease

## 2019-07-17 ENCOUNTER — Ambulatory Visit

## 2019-07-17 ENCOUNTER — Ambulatory Visit: Admitting: Student in an Organized Health Care Education/Training Program

## 2019-07-17 ENCOUNTER — Ambulatory Visit: Admit: 2019-07-17

## 2019-07-17 NOTE — Progress Notes (Signed)
 [name], [name] **DOB:** May 21, 1960 (59 yo F) **Acc No.** [id number] **DOS:**  07/17/2019    ---       [name], [name]**    ------    59 Y old Female, DOB: May 31, 1960, External MRN: [id number]    Account Number: [id number]    [address], [place], [id number]    Home: [phone]    Guarantor: [name] Insurance: [id number] [id number] PPO    PCP: [name] Referring: [name] External Visit ID: [id number]    [facility]        * * *    07/17/2019  **Appointment Provider:** [name] Cashing, MD **CHN#:** [id number]    ------     **Supervising Provider:** [name] Pheasant, MD    ---       **Current Medications**    ---    Taking    * Acetaminophen 500 MG Tablet 2 tablets as needed Orally every 8 hrs    ---    * Baclofen 10 MG Tablet 1/2 tab Orally prn, Notes: prn    ---    * Calcium Carbonate-Vitamin D 600-200 MG-UNIT Capsule 1 capsule with a meal Orally Twice a day    ---    * Famotidine 40 MG Tablet 1 tablet at bedtime Orally Once a day, Notes: prn    ---    * Gammagard - Solution as directed Injection once every 4 weeks, Notes: every 4 weeks    ---    * GlipiZIDE XL 5 MG Tablet Extended Release 24 Hour 1 tablet Orally Once a day    ---    * Glucophage XR 500 mg Tablet Extended Release 24 Hour 4 tablets Orally Once a day    ---    * Hydroxychloroquine Sulfate 200 MG Tablet 1 tablet with food or milk Orally Once a day    ---    * Jardiance 10 MG Tablet 1 tablet Orally Once a day    ---    * Lidocaine 5 % Patch 1 patch remove after 12 hours Externally Once a day, Notes: prn    ---    * Lisinopril 2.5 MG Tablet 1 tablet Orally Once a day    ---    * Mycophenolate Mofetil 500 MG Tablet 2 tablet Orally twice daily    ---    * Omeprazole 40 mg Capsule Delayed Release 1 capsule Orally Once a day    ---    * Oxycodone HCl 5 MG Tablet 1 tablet as needed Orally every 4-6 hrs    ---    * PredniSONE 10 MG Tablet 2-3 tablet Orally Once a day    ---    * Rituxan 500 MG/50ML  Solution as directed Intravenous , Notes: Last dose 05/19/19    ---    * Rosuvastatin Calcium 10 mg Tablet 1 tablet Orally Once a day    ---    * Tramadol HCl 50 MG Tablet 1 tablet Orally every 6 hrs, Notes: PRN - rarely takes    ---    * Vitamin B Complex - Tablet 1 tablet Orally once daily    ---    * Vitamin D 25 MCG (1000 UT) Tablet 1 tablet Orally Once a day    ---    * Medication List reviewed and reconciled with the patient    ---     Past Medical History    ---  Scleroderma - CREST dx 2007.        ---    IgG4 deficiency s/p IVIG 2010 ----single infusion given preventively after  week of bilateral knee replacements at [facility] 2010.        ---    Fx left wrist in 1994.        ---    Blood clot at LUE in 2006 on a short course of Coumadin.        ---    Bilateral carpal tunnel syndrome s/p Lt carpal tunnel release and steroids  injection right--.        ---    Bone spur left foot.        ---    Scoliosis.        ---    Spinal stenosis s/p steroids injection.        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 Katrinka [name] 2010--- never infected, but packed with antibiotics with  surgery;.        ---    Right rotator cuff repairs x 4, complicated by repeat tears, infection,  placement of anchor material.        ---    Elevated liver function tests.        ---    Diabetes.        ---    Seronegative RA.        ---    GI bleed .        ---    GERD.        ---      **Surgical History**    ---      Right rotator cuff repair, with infected hardware that had to be removed  2006    ---    Repeat right shoulder surgery, also which became infected. 2007    ---    Left rotator cuff repair 2008    ---    bilateral knee replacements 2009    ---    Left arthroscopic carpal tunnel release 01/2011    ---    ORIF Left 4th metatarsal bone    ---    Left Ulna shortening 1994    ---    Knee replacement 2010    ---    Hand surgery 2013, 2015    ---    remove gallbladder/hernia 1990     ---    Plate left foot 3rd metatarsal 2008    ---    Left reverse TSA 03/07/16    ---    Left Hamstring Repair 05/2019    ---      **Family History**    ---      Mother: deceased 46 yrs, lung cancer, hyperthyroidism, diagnosed with Other  malignant neoplasm of unspecified site    ---    Father: deceased 75s yrs, heart attack, Unspecified heart disease    ---    Siblings: alive, Brother - scleroderma, COPD    ---    3 brother(s) .    ---    Denies family history of GI cancer.    ---      **Social History**    ---    Tobacco history: Never smoked.    Marital Status  Single.    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    Alcohol  Former daily EtOH use in 20s.    Abuse/Neglect Do you feel unsafe in your relationships? No, Plan  Resources  Provided, Patient states they feel safe to return home, Have you ever been  hit, kicked, punched or otherwise hurt by someone in the past year? No.    Illicit drugs: Denies.  Nonsmoker.    Lives with longstanding boyfriend.    ---      **Allergies**    ---      N.K.D.A.    ---    [name] Verified]      **Hospitalization/Major Diagnostic Procedure**    ---      as above    ---    GI bleed 05/2019    ---      **Review of Systems**    ---    12 point ROS negative except HPI.       **Reason for Appointment**    ---      1\. Intra-atrial shunt    ---      **History of Present Illness**    ---     _GENERAL_ :    I had the pleasure of seeing Ms. [name] in general cardiology consult clinic at  [facility] today.    As you may know, [name] is a 59 year old femal with history of scleroderma,  IgG4 deficiency, DM, HTN, HLD, DVT in 2006 not on AC.    She was admited for GIB at [facility] in 05/2019. She underwent  EGD and C-scope which showed no acute bleeding. She received IVF and RBC which  led to swelling of her legs after dicharge. She was followed by her PCP and  underwent Doppler and TTE. 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  TTE  was unremarkable except intra-atrial shunt. She was referred to our clinic  for further workup. She was found to have frequent PVCs for which she  underwent stress test which wsa unremarkable. Ziopatch result is pending.    Today, she is feeling well. Her swelling has already resolved.She denies  fever, dizziness, DOE, SOB, chest pain, palpitations, nausea, abd pain,  diarrhea, constipation.      **Vital Signs**    ---    Pain scale 7, Ht-in 62, Wt-lbs 155, BMI 28.35, BP 116/62, HR 77, RR 16, BSA  1.75, O2 100%, Ht-cm 157.48, Wt-kg 70.31, Wt Change -1 lb.      **Examination**    ---     _Cardiac Testing:_    EKG:  **EKG 06/23/2019:** NSR, HR 91 bpm, QRSd 98 mse, QTc 446 ms, PVCs **EKG  06/06/2019:** NSR, HR 92 bpm, QRSd 84 mse, QTc 451 ms, PVCs.    Echo: **06/12/19:** LVEF 60%, no FWMA, nl RV, mildly dilated LA, nl RA,  intraatrial shunt +, nl TV, mild TR, nl RVSP, nl AV .    Labs  **06/2019:** Cr 0.75 K 4.4 AST 37 ALK 302 ALT 91 TBil 0.5WBC 9.8 Hb 11.6  PLT 291 ESR 32 CRP 1.46 INR 0.9.         **Physical Examination**    ---     _GENERAL_ :    General Appearance: unremarkable, Looks Healthy, well-developed. Mood/Affect:  pleasant. HEENT Normocephalic, atraumatic. Sclera clear, conjunctiva pink,  moist oral mucosa, neck supple. Neck No carotid Bruits. Skin there is  excoriation and mild swelling of her fingers. Cardiovascular regular rate and  rhythm, normal S1 S2, no S3 S4 no mrg. there is PVCs. Lungs Clear to  auscultation; no wheezes, rhonchi or rales. Abdomen Soft, nontender,  nondistended, no organmegaly. Extremities There was no clubbing, cyanosis or  edema noted.         **Assessments**    ---    1\. PFO (patent foramen ovale) - Q21.1 (Primary)    ---    2\. 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 [name] in general cardiology consult clinic  at [facility] today. As you may know, Ms. Kim is a 59 year old  femal with history of scleroderma, IgG4 deficiency, DM, HTN, HLD,  DVT in 2006  not on AC. She was referred to our clinic for the evaluatioin of intra-atrial  shunt. The available evidence from population-based studies suggests that PFO  and large PFO are not independent risk factors for ischemic stroke in  otherwise asymptomatic individuals and generally no intervention is indicated  in asymptomatic patients. If the risk of DVT is low enough we would no  intervene on the PFO. We will reach out to vascular and PCP to estimate the  risk of DVT.    With regard to PVCs, stress test showed no evidence of ischemia or infarct.  Given paucity of symptoms, Pt does not require any treatment at this point.  She underwent ziopatch already and the results are pending. We will follow up  the results.    We had the pleasure of seeing Ms. Wrobel today. Please feel free to contact us  if you have any questions.    ---      **Follow Up**    ---    prn    **Appointment Provider:** [name] Cashing, MD    Electronically signed by Lazarus Salines , MD on 07/18/2019 at 10:08 AM EDT    Sign off status: Completed        * * *        [facility]    [address]    [place], [address]    [place], [place] [address]    Tel: [phone]    Fax: [phone]              * * *          Progress Note: [name] Cashing, MD 07/17/2019    ---    Note generated by eClinicalWorks EMR/PM Software ([email or web address])

## 2019-07-17 NOTE — Progress Notes (Signed)
 .  Progress Notes  .  Patient: [name], [name] [facility]  Provider: Billee Cashing    .  DOB: 1960/07/05 Age: 59 Y Sex: Female  Supervising Provider:: Einar Pheasant, MD  Date: 07/17/2019  .  PCP: Ruffin Frederick    Date: 07/17/2019  .  --------------------------------------------------------------------------------  .  REASON FOR APPOINTMENT  .  1. Intra-atrial shunt  .  HISTORY OF PRESENT ILLNESS  .  GENERAL:   I had the pleasure of seeing [name] in general cardiology  consult clinic at [facility] today.As you may know, Ms.  [name] is a 59 year old femal with history of scleroderma, IgG4  deficiency, DM, HTN, HLD, DVT in 2006 not on AC. She was admited  for GIB at [facility] in 05/2019. She underwent EGD  and C-scope which showed no acute bleeding. She received IVF and  RBC which led to swelling of her legs after dicharge. She was  followed by her PCP and underwent Doppler and TTE. 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 TTE was unremarkable except  intra-atrial shunt. She was referred to our clinic for further  workup. She was found to have frequent PVCs for which she  underwent stress test which wsa unremarkable. Ziopatch result is  pending. Today, she is feeling well. Her swelling has already  resolved.She denies fever, dizziness, DOE, SOB, chest pain,  palpitations, nausea, abd pain, diarrhea, constipation.  .  CURRENT MEDICATIONS  .  Taking Acetaminophen 500 MG Tablet 2 tablets as needed Orally  every 8 hrs  Taking Baclofen 10 MG Tablet 1/2 tab Orally prn, Notes: prn  Taking Calcium Carbonate-Vitamin D 600-200 MG-UNIT Capsule 1  capsule with a meal Orally Twice a day  Taking Famotidine 40 MG Tablet 1 tablet at bedtime Orally Once a  day, Notes: prn  Taking Gammagard - Solution as directed Injection once every 4  weeks, Notes: every 4 weeks  Taking GlipiZIDE XL 5 MG Tablet Extended Release 24 Hour 1 tablet  Orally Once a day  Taking Glucophage XR 500 mg Tablet Extended Release 24  Hour 4  tablets Orally Once a day  Taking Hydroxychloroquine Sulfate 200 MG Tablet 1 tablet with  food or milk Orally Once a day  Taking Jardiance 10 MG Tablet 1 tablet Orally Once a day  Taking Lidocaine 5 % Patch 1 patch remove after 12 hours  Externally Once a day, Notes: prn  Taking Lisinopril 2.5 MG Tablet 1 tablet Orally Once a day  Taking Mycophenolate Mofetil 500 MG Tablet 2 tablet Orally twice  daily  Taking Omeprazole 40 mg Capsule Delayed Release 1 capsule Orally  Once a day  Taking Oxycodone HCl 5 MG Tablet 1 tablet as needed Orally every  4-6 hrs  Taking PredniSONE 10 MG Tablet 2-3 tablet Orally Once a day  Taking Rituxan 500 MG/50ML Solution as directed Intravenous ,  Notes: Last dose 05/19/19  Taking Rosuvastatin Calcium 10 mg Tablet 1 tablet Orally Once a  day  Taking Tramadol HCl 50 MG Tablet 1 tablet Orally every 6 hrs,  Notes: PRN - rarely takes  Taking Vitamin B Complex - Tablet 1 tablet Orally once daily  Taking Vitamin D 25 MCG (1000 UT) Tablet 1 tablet Orally Once a  day  Medication List reviewed and reconciled with the patient  .  PAST MEDICAL HISTORY  .  Scleroderma - CREST dx 2007  IgG4 deficiency s/p IVIG 2010 ----single infusion given  preventively after week of bilateral  knee replacements at [facility]  2010  Fx left wrist in 1994  Blood clot at LUE in 2006 on a short course of Coumadin  Bilateral carpal tunnel syndrome s/p Lt carpal tunnel release and  steroids injection right--  Bone spur left foot  Scoliosis  Spinal stenosis s/p steroids injection  s/p bilateral knee replacements for valgus /arthritic  complications, performed by Dr. Katrinka Blazing 2010--- never infected, but  packed with antibiotics with surgery;  Right rotator cuff repairs x 4, complicated by repeat tears,  infection, placement of anchor material  Elevated liver function tests  Diabetes  Seronegative RA  GI bleed  GERD  .  ALLERGIES  .  N.K.D.A.  .  SURGICAL HISTORY  .  Right rotator cuff repair, with infected hardware that had to  be  removed 2006  Repeat right shoulder surgery, also which became infected. 2007  Left rotator cuff repair 2008  bilateral knee replacements 2009  Left arthroscopic carpal tunnel release 01/2011  ORIF Left 4th metatarsal bone  Left Ulna shortening 1994  Knee replacement 2010  Hand surgery 2013, 2015  remove gallbladder/hernia 1990  Plate left foot 3rd metatarsal 2008  Left reverse TSA 03/07/16  Left Hamstring Repair 05/2019  .  FAMILY HISTORY  .  Mother: deceased 69 yrs, lung cancer, hyperthyroidism, diagnosed  with Other malignant neoplasm of unspecified site  Father: deceased 52s yrs, heart attack, Unspecified heart disease  Siblings: alive, Brother - scleroderma, COPD  3 brother(s) .  Denies family history of GI cancer.  .  SOCIAL HISTORY  .  .  Tobacco  history: Never smoked.  .  Marital Status Single.  .  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  .  Alcohol Former daily EtOH use in 20s.  .  Abuse/NeglectDo you feel unsafe in your relationships?No ,  PlanResources Provided, Patient states they feel safe to return  home , Have you ever been hit, kicked, punched or otherwise hurt  by someone in the past year? No.  .  Illicit drugs: Denies.  .  Nonsmoker.Lives with longstanding boyfriend.  [name]  HOSPITALIZATION/MAJOR DIAGNOSTIC PROCEDURE  .  as above  GI bleed 05/2019  .  REVIEW OF SYSTEMS  .  12 point ROS negative except HPI.  [name]  VITAL SIGNS  .  Pain scale 7, Ht-in 62, Wt-lbs 155, BMI 28.35, BP 116/62, HR 77,  RR 16, BSA 1.75, O2 100%, Ht-cm 157.48, Wt-kg 70.31, Wt Change -1  lb.  [name]  EXAMINATION  .  Cardiac Testing:  EKG: EKG 06/23/2019: NSR, HR 91 bpm, QRSd 98 mse, QTc 446 ms,  PVCsEKG 06/06/2019: NSR, HR 92 bpm, QRSd 84 mse, QTc 451 ms, PVCs.  Echo:06/12/19: LVEF 60%, no FWMA, nl RV, mildly dilated LA, nl RA,  intraatrial shunt +, nl TV, mild TR, nl RVSP, nl AV .  Labs 06/2019: Cr 0.75 K 4.4 AST 37 ALK 302 ALT 91 TBil 0.5WBC 9.8  Hb 11.6 PLT 291 ESR 32 CRP 1.46 INR 0.9.  .  PHYSICAL  EXAMINATION  .  GENERAL:  General Appearance:  unremarkable, Looks Healthy, well-developed.  Mood/Affect:  pleasant. HEENT  Normocephalic, atraumatic. Sclera  clear, conjunctiva pink, moist oral mucosa, neck supple. Neck  No  carotid Bruits. Skin  there is excoriation and mild swelling of  her fingers. Cardiovascular  regular rate and rhythm, normal S1  S2, no S3 S4 no mrg. there is PVCs. Lungs  Clear to auscultation;  no wheezes, rhonchi or rales. Abdomen  Soft,  nontender,  nondistended, no organmegaly. Extremities  There was no clubbing,  cyanosis or edema noted.  .  ASSESSMENTS  .  PFO (patent foramen ovale) - Q21.1 (Primary)  .  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  I had the pleasure of seeing [name] in general cardiology  consult clinic at [facility] today. As you may know,  [name] is a 59 year old femal with history of scleroderma,  IgG4 deficiency, DM, HTN, HLD, DVT in 2006 not on AC. She was  referred to our clinic for the evaluatioin of intra-atrial shunt.  The available evidence from population-based studies suggests  that PFO and large PFO are not independent risk factors for  ischemic stroke in otherwise asymptomatic individuals and  generally no intervention is indicated in asymptomatic patients.  If the risk of DVT is low enough we would no intervene on the  PFO. We will reach out to vascular and PCP to estimate the risk  of DVT. With regard to PVCs, stress test showed no evidence of  ischemia or infarct. Given paucity of symptoms, Pt does not  require any treatment at this point. She underwent ziopatch  already and the results are pending. We will follow up the  results. We had the pleasure of seeing [name] today. Please  feel free to contact us if you have any questions.  .  FOLLOW UP  .  prn  .  [name]  Appointment Provider: Billee Cashing, MD  .  Electronically signed by Lazarus Salines , MD on  07/18/2019 at 10:08 AM EDT  .  CONFIRMATORY SIGN OFF  I personally interviewed and examined the  patient and both the fellow and I contributed to this electronic note. I agree with the history, exam, assessment and plan as detailed in this note and edited it as necessary. DOWNEY,BRIAN C 07/18/2019 10:08:11 AM >   .  Document electronically signed by Billee Cashing    .

## 2019-07-18 ENCOUNTER — Ambulatory Visit: Admitting: Rheumatology

## 2019-07-18 ENCOUNTER — Ambulatory Visit

## 2019-07-18 ENCOUNTER — Ambulatory Visit: Admitting: Hepatology

## 2019-07-18 ENCOUNTER — Ambulatory Visit: Admit: 2019-07-18

## 2019-07-18 MED ORDER — BACLOFEN: 0.5 | 15 | 2 refills | 0 days | Status: AC

## 2019-07-18 NOTE — Progress Notes (Signed)
 .  Progress Notes  .  Patient: [name], [name] [facility]  Provider: [name]  DOB:11-Sep-1960 Age: 59 Y Sex: Female  .  PCP: Ruffin Frederick    Date: 07/18/2019  .  --------------------------------------------------------------------------------  .  HISTORY OF PRESENT ILLNESS  .  GENERAL:  This is a Telehealth visit due to the  COVID-19 pandemic. The visit is taking place using audio and  video. The patient has consented to this format. The patient is  at home. The provider is in the office.   Suha is here for FU of her scleroderma, inflammatory arthritis,  recent GI bleed, recent hamstring tear and possible heart  disease. She started CellCept 2 weeks ago.In the interim, she had  a good visit with cardiology. They confirmed an ASD but found no  indication for closure or for anticoagulation unless there was  clear evidence of DVT. She is scheduled to see vascular to  undergo that evaluation. She had previously seen GI who felt  there was no indication for capsule endoscopy at theis time. She  has been doing PT for her hamstring tear which has been improving  slowly as a result of the therapy. She is pleased with the  progress.With respect to her Scleroderma, her Raynaud's has been  better since the weather has warmed. With respect to her  arthritis, she has noted some improvement since she started the  mycophenolate. However she still has significant swelling and  stiffness and remains unable to effectively do ADLs.No side  effects from mycophenolate or steroids. Taking 500 mg BID and  Predinsone alternating 20 mg and 10 mg daily. Very happy with  progress but a long way to go.  .  CURRENT MEDICATIONS  .  Taking Acetaminophen 500 MG Tablet 2 tablets as needed Orally  every 8 hrs  Taking Baclofen 10 MG Tablet 1/2 tab Orally prn, Notes: prn  Taking Calcium Carbonate-Vitamin D 600-200 MG-UNIT Capsule 1  capsule with a meal Orally Twice a day  Taking Famotidine 40 MG Tablet 1 tablet at bedtime Orally Once a  day,  Notes: prn  Taking Gammagard - Solution as directed Injection once every 4  weeks, Notes: every 4 weeks  Taking GlipiZIDE XL 5 MG Tablet Extended Release 24 Hour 1 tablet  Orally Once a day  Taking Glucophage XR 500 mg Tablet Extended Release 24 Hour 4  tablets Orally Once a day  Taking Hydroxychloroquine Sulfate 200 MG Tablet 1 tablet with  food or milk Orally Once a day  Taking Jardiance 10 MG Tablet 1 tablet Orally Once a day  Taking Lidocaine 5 % Patch 1 patch remove after 12 hours  Externally Once a day, Notes: prn  Taking Lisinopril 2.5 MG Tablet 1 tablet Orally Once a day  Taking Mycophenolate Mofetil 500 MG Tablet 2 tablet Orally twice  daily  Taking Omeprazole 40 mg Capsule Delayed Release 1 capsule Orally  Once a day  Taking Oxycodone HCl 5 MG Tablet 1 tablet as needed Orally every  4-6 hrs  Taking PredniSONE 10 MG Tablet 2-3 tablet Orally Once a day  Taking Rituxan 500 MG/50ML Solution as directed Intravenous ,  Notes: Last dose 05/19/19  Taking Rosuvastatin Calcium 10 mg Tablet 1 tablet Orally Once a  day  Taking Tramadol HCl 50 MG Tablet 1 tablet Orally every 6 hrs,  Notes: PRN - rarely takes  Taking Vitamin B Complex - Tablet 1 tablet Orally once daily  Taking Vitamin D 25 MCG (1000  UT) Tablet 1 tablet Orally Once a  day  Medication List reviewed and reconciled with the patient  .  PAST MEDICAL HISTORY  .  Scleroderma - CREST dx 2007  IgG4 deficiency s/p IVIG 2010 ----single infusion given  preventively after week of bilateral knee replacements at [facility]  2010  Fx left wrist in 1994  Blood clot at LUE in 2006 on a short course of Coumadin  Bilateral carpal tunnel syndrome s/p Lt carpal tunnel release and  steroids injection right--  Bone spur left foot  Scoliosis  Spinal stenosis s/p steroids injection  s/p bilateral knee replacements for valgus /arthritic  complications, performed by Dr. Katrinka Blazing 2010--- never infected, but  packed with antibiotics with surgery;  Right rotator cuff repairs x 4, complicated  by repeat tears,  infection, placement of anchor material  Elevated liver function tests  Diabetes  Seronegative RA  GI bleed  GERD  .  ALLERGIES  .  N.K.D.A.  .  SOCIAL HISTORY  .  .  Tobacco  history: Never smoked.  .  Marital Status Single.  .  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  .  Alcohol Former daily EtOH use in 20s.  .  Abuse/NeglectDo you feel unsafe in your relationships?No ,  PlanResources Provided, Patient states they feel safe to return  home , Have you ever been hit, kicked, punched or otherwise hurt  by someone in the past year? No.  .  Illicit drugs: Denies.  .  Nonsmoker.Lives with longstanding boyfriend.  [name]  REVIEW OF SYSTEMS  .  Rheumatology:  .  General    No fever, weight loss, swollen glands . Muscoskeletal     +Joint pain, +joint swelling, +morning stiffness  . Eyes    No  vision loss, eye dry, red eye, eye pain . Mouth    No mouth sores  . Cardiovascular    ? raynaud, no chest pain , irregular heart  beat, racing heart beat, leg swellingUpcoming Stress Test .  Pulmonary    No cough, cough blood, shortness of breath . Skin     No rash, photosensitivity . Lymphatics    No swollen glands,  tender glands .  [name]  EXAMINATION  .  Rheumatology:  General Appearance: Alert and oriented , No apparent distress .  Eyes: No, scleral icterus, scleral erythema .  Pulm:speeking in full sentances, no apparent shortness of breath  or cough.  Skin:No rashes or nail changes, mo obvious digital ulceration.  MuskuloskeletalElbows, shoulders, hips, knees have intact ROM.  Hands have apparent synovitis in the 2-3 PIPs and MCPs  bilaterally..  Skin and Nails Minimal sclerodactyly, no cracking atthe  fingertips, no ulcers.  .  ASSESSMENTS  .  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  Scleroderma - M34.9  .  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  Long-term use of high-risk medication - Z79.899  .  PFO (patent foramen ovale) - Q21.1  .  She has had some mild improvement after 2  weeks on CellCept.  However she remains disabled and has a long way to go. I am  encouraged that she has had responded. Given her CVID, I  recommend we hold at the current dose of 1000 mg daily and watch  how things progress over then coming 2-3 weeks. She is at risk  for infection but continues to get IVIG infusions which should  hopefully protect her. I will see her in another 2-3 weeks to  evaluate whether we she is responding more or we should push the  dose.Her GI and cardiology concerns are stable for now, but we  will have to be vigilant looking for further GI bleed or any  signs of clotting disorder, though she would be at high risk for  anticoagulation given recent bleed. She is recovering from her  hamstring injury and I think we should try to taper the steroids  which are surely contributing to her tendinoplasty. But I don't  think we can do that until she gets a little better with the  CellCept. I encouraged her to continue PT.She will need labs and  an full exam in the next 2-3 weeks. I will plan to follow up with  her at that time and make a decision about her immunosuppression.  She has received COVID vaccination, both doses. She knows to call  or go to the ED immediately with any new leg swelling, melena or  shortness of breath.  .  FOLLOW UP  .  3 Weeks - 4 Weeks  .  Electronically signed by Erasmo Leventhal , MD on  08/03/2019 at 10:07 AM EDT  .  Document electronically signed by [name]

## 2019-07-18 NOTE — Progress Notes (Signed)
 [name] [name], [name] [name] **DOB:** May 03, 1960 (59 yo F) **Acc No.** [id number] **DOS:**  07/18/2019    ---       [name] [name], [name] [name]**    ------    59 Y old Female, DOB: 57-12-1960, External MRN: [id number]    Account Number: [id number]    [address], [place], [id number]    Home: [phone]    Guarantor: [name] Insurance: [id number] PPO    PCP: Ruffin Frederick Referring: Ruffin Frederick    Appointment Facility: Rheumatology, Allergy and Immunology        * * *    07/18/2019 Progress Notes: [name], MD **CHN#:** [id number]    ------    ---       **History of Present Illness**    ---     _GENERAL_ :    This is a Telehealth visit due to the COVID-19 pandemic. The visit is taking  place using audio and video. The patient has consented to this format. The  patient is at home. The provider is in the office.    [name] [name] is here for FU of her scleroderma, inflammatory arthritis, recent GI  bleed, recent hamstring tear and possible heart disease. She started CellCept  2 weeks ago.    In the interim, she had a good visit with cardiology. They confirmed an ASD  but found no indication for closure or for anticoagulation unless there was  clear evidence of DVT. She is scheduled to see vascular to undergo that  evaluation.    She had previously seen GI who felt there was no indication for capsule  endoscopy at theis time.    She has been doing PT for her hamstring tear which has been improving slowly  as a result of the therapy. She is pleased with the progress.    With respect to her Scleroderma, her Raynaud's has been better since the  weather has warmed. With respect to her arthritis, she has noted some  improvement since she started the mycophenolate. However she still has  significant swelling and stiffness and remains unable to effectively do ADLs.    No side effects from mycophenolate or steroids. Taking 500 mg BID and  Predinsone alternating 20 mg and 10 mg daily. Very happy with progress but a  long  way to go.      **Current Medications**    ---    Taking    * Acetaminophen 500 MG Tablet 2 tablets as needed Orally every 8 hrs    ---    * Baclofen 10 MG Tablet 1/2 tab Orally prn, Notes: prn    ---    * Calcium Carbonate-Vitamin D 600-200 MG-UNIT Capsule 1 capsule with a meal Orally Twice a day    ---    * Famotidine 40 MG Tablet 1 tablet at bedtime Orally Once a day, Notes: prn    ---    * Gammagard - Solution as directed Injection once every 4 weeks, Notes: every 4 weeks    ---    * GlipiZIDE XL 5 MG Tablet Extended Release 24 Hour 1 tablet Orally Once a day    ---    * Glucophage XR 500 mg Tablet Extended Release 24 Hour 4 tablets Orally Once a day    ---    * Hydroxychloroquine Sulfate 200 MG Tablet 1 tablet with food or milk Orally Once a day    ---    [place] Pepper  10 MG Tablet 1 tablet Orally Once a day    ---    * Lidocaine 5 % Patch 1 patch remove after 12 hours Externally Once a day, Notes: prn    ---    * Lisinopril 2.5 MG Tablet 1 tablet Orally Once a day    ---    * Mycophenolate Mofetil 500 MG Tablet 2 tablet Orally twice daily    ---    * Omeprazole 40 mg Capsule Delayed Release 1 capsule Orally Once a day    ---    * Oxycodone HCl 5 MG Tablet 1 tablet as needed Orally every 4-6 hrs    ---    * PredniSONE 10 MG Tablet 2-3 tablet Orally Once a day    ---    * Rituxan 500 MG/50ML Solution as directed Intravenous , Notes: Last dose 05/19/19    ---    * Rosuvastatin Calcium 10 mg Tablet 1 tablet Orally Once a day    ---    * Tramadol HCl 50 MG Tablet 1 tablet Orally every 6 hrs, Notes: PRN - rarely takes    ---    * Vitamin B Complex - Tablet 1 tablet Orally once daily    ---    * Vitamin D 25 MCG (1000 UT) Tablet 1 tablet Orally Once a day    ---    * Medication List reviewed and reconciled with the patient    ---      **Past Medical History**    ---      Scleroderma - CREST dx 2007.        ---    IgG4 deficiency s/p IVIG 2010 ----single infusion given preventively after  week of bilateral knee  replacements at [facility] 2010.        ---    Fx left wrist in 1994.        ---    Blood clot at LUE in 2006 on a short course of Coumadin.        ---    Bilateral carpal tunnel syndrome s/p Lt carpal tunnel release and steroids  injection right--.        ---    Bone spur left foot.        ---    Scoliosis.        ---    Spinal stenosis s/p steroids injection.        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 Katrinka Blazing 2010--- never infected, but packed with antibiotics with  surgery;.        ---    Right rotator cuff repairs x 4, complicated by repeat tears, infection,  placement of anchor material.        ---    Elevated liver function tests.        ---    Diabetes.        ---    Seronegative RA.        ---    GI bleed .        ---    GERD.        ---      **Social History**    ---    Tobacco history: Never smoked.    Marital Status  Single.    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    Alcohol  Former daily EtOH use in 20s.    Abuse/Neglect  Do you feel unsafe  in your relationships? No, Plan Resources  Provided, Patient states they feel safe to return home, Have you ever been  hit, kicked, punched or otherwise hurt by someone in the past year? No.    Illicit drugs: Denies.  Nonsmoker.    Lives with longstanding boyfriend.    ---      **Allergies**    ---      N.K.D.A.    ---      **Review of Systems**    ---     _Rheumatology_ :    General No fever, weight loss, swollen glands. Muscoskeletal +Joint pain,  +joint swelling, +morning stiffness . Eyes No vision loss, eye dry, red eye,  eye pain. Mouth No mouth sores. Cardiovascular ? raynaud, no chest pain ,  irregular heart beat, racing heart beat, leg swellingUpcoming Stress Test.  Pulmonary No cough, cough blood, shortness of breath. Skin No rash,  photosensitivity. Lymphatics No swollen glands, tender glands.         **Examination**    ---     _Rheumatology:_    General Appearance:  Alert and oriented ,  No apparent distress .    Eyes:  No, scleral icterus, scleral erythema .    Pulm: speeking in full sentances, no apparent shortness of breath or cough.    Skin: No rashes or nail changes, mo obvious digital ulceration.    Muskuloskeletal Elbows, shoulders, hips, knees have intact ROM. Hands have  apparent synovitis in the 2-3 PIPs and MCPs bilaterally..    Skin and Nails  Minimal sclerodactyly, no cracking atthe fingertips, no  ulcers.         **Assessments**    ---    1\. 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 Scleroderma - M34.9    ---    3\. 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 Long-term use of high-risk medication - Z79.899    ---    5\. PFO (patent foramen ovale) - Q21.1    ---     She has had some mild improvement after 2 weeks on CellCept. However she  remains disabled and has a long way to go. I am encouraged that she has had  responded. Given her CVID, I recommend we hold at the current dose of 1000 mg  daily and watch how things progress over then coming 2-3 weeks. She is at risk  for infection but continues to get IVIG infusions which should hopefully  protect her. I will see her in another 2-3 weeks to evaluate whether we she is  responding more or we should push the dose.    Her GI and cardiology concerns are stable for now, but we will have to be  vigilant looking for further GI bleed or any signs of clotting disorder,  though she would be at high risk for anticoagulation given recent bleed.    She is recovering from her hamstring injury and I think we should try to taper  the steroids which are surely contributing to her tendinoplasty. But I don't  think we can do that until she gets a little better with the CellCept. I  encouraged her to continue PT.    She will need labs and an full exam in the next 2-3 weeks. I will plan to  follow up with her at that time and make a decision about her  immunosuppression. She has received COVID vaccination,  both doses.  She knows  to call or go to the ED immediately with any new leg swelling, melena or  shortness of breath.    ---      **Follow Up**    ---    3 Weeks - 4 Weeks    Electronically signed by Erasmo Leventhal , MD on 08/03/2019 at 10:07 AM EDT    Sign off status: Completed        * * *        Rheumatology, Allergy and Immunology    [address]    [place] [address], [address]    [place], [place] [address]    Tel: [phone]    Fax: [phone]              * * *          Progress Note: [name], MD 07/18/2019    ---    Note generated by eClinicalWorks EMR/PM Software ([email or web address])

## 2019-07-18 NOTE — Progress Notes (Signed)
* * *      LISEL, SIEGRIST [name] **DOB:** March 04, 1961 (59 yo F) **Acc No.** [id number] **DOS:**  07/18/2019    ---       [name], Allure [name]**    ------    71 Y old Female, DOB: 05/18/1960    [address], [place], [place] [address]    Home: [phone]    Provider: Corwin Levins        * * *    Telephone Encounter    ---    Answered by  Keane Police Date: 07/18/2019       Time: 03:20 PM    Reason  4/22 appt    ------            Message                     lvm                Action Taken                     [name],[name]  07/18/2019 3:20:42 PM >                     * * *                ---          * * *         Provider: Corwin Levins 07/18/2019    ---    Note generated by eClinicalWorks EMR/PM Software ([email or web address])

## 2019-07-18 NOTE — Progress Notes (Signed)
 .  Progress Notes  .  Patient: [name], [name] [facility]  Provider: [name]  DOB:03/22/1961 Age: 59 Y Sex: Female  .  PCP: Ruffin Frederick    Date: 07/18/2019  .  --------------------------------------------------------------------------------  .  HISTORY OF PRESENT ILLNESS  .  GENERAL:  This is a Telehealth visit due to the  COVID-19 pandemic. The visit is taking place using audio and  video. The patient has consented to this format. The patient is  at home. The provider is in the office.   [name] is here for FU of her scleroderma, inflammatory arthritis,  recent GI bleed, recent hamstring tear and possible heart  disease. She started CellCept 2 weeks ago.In the interim, she had  a good visit with cardiology. They confirmed an ASD but found no  indication for closure or for anticoagulation unless there was  clear evidence of DVT. She is scheduled to see vascular to  undergo that evaluation. She had previously seen GI who felt  there was no indication for capsule endoscopy at theis time. She  has been doing PT for her hamstring tear which has been improving  slowly as a result of the therapy. She is pleased with the  progress.With respect to her Scleroderma, her Raynaud's has been  better since the weather has warmed. With respect to her  arthritis, she has noted some improvement since she started the  mycophenolate. However she still has significant swelling and  stiffness and remains unable to effectively do ADLs.No side  effects from mycophenolate or steroids. Taking 500 mg BID and  Predinsone alternating 20 mg and 10 mg daily. Very happy with  progress but a long way to go.  .  CURRENT MEDICATIONS  .  Taking Acetaminophen 500 MG Tablet 2 tablets as needed Orally  every 8 hrs  Taking Baclofen 10 MG Tablet 1/2 tab Orally prn, Notes: prn  Taking Calcium Carbonate-Vitamin D 600-200 MG-UNIT Capsule 1  capsule with a meal Orally Twice a day  Taking Famotidine 40 MG Tablet 1 tablet at bedtime Orally Once a  day,  Notes: prn  Taking Gammagard - Solution as directed Injection once every 4  weeks, Notes: every 4 weeks  Taking GlipiZIDE XL 5 MG Tablet Extended Release 24 Hour 1 tablet  Orally Once a day  Taking Glucophage XR 500 mg Tablet Extended Release 24 Hour 4  tablets Orally Once a day  Taking Hydroxychloroquine Sulfate 200 MG Tablet 1 tablet with  food or milk Orally Once a day  Taking Jardiance 10 MG Tablet 1 tablet Orally Once a day  Taking Lidocaine 5 % Patch 1 patch remove after 12 hours  Externally Once a day, Notes: prn  Taking Lisinopril 2.5 MG Tablet 1 tablet Orally Once a day  Taking Mycophenolate Mofetil 500 MG Tablet 2 tablet Orally twice  daily  Taking Omeprazole 40 mg Capsule Delayed Release 1 capsule Orally  Once a day  Taking Oxycodone HCl 5 MG Tablet 1 tablet as needed Orally every  4-6 hrs  Taking PredniSONE 10 MG Tablet 2-3 tablet Orally Once a day  Taking Rituxan 500 MG/50ML Solution as directed Intravenous ,  Notes: Last dose 05/19/19  Taking Rosuvastatin Calcium 10 mg Tablet 1 tablet Orally Once a  day  Taking Tramadol HCl 50 MG Tablet 1 tablet Orally every 6 hrs,  Notes: PRN - rarely takes  Taking Vitamin B Complex - Tablet 1 tablet Orally once daily  Taking Vitamin D 25 MCG (1000  UT) Tablet 1 tablet Orally Once a  day  Medication List reviewed and reconciled with the patient  .  PAST MEDICAL HISTORY  .  Scleroderma - CREST dx 2007  IgG4 deficiency s/p IVIG 2010 ----single infusion given  preventively after week of bilateral knee replacements at [facility]  2010  Fx left wrist in 1994  Blood clot at LUE in 2006 on a short course of Coumadin  Bilateral carpal tunnel syndrome s/p Lt carpal tunnel release and  steroids injection right--  Bone spur left foot  Scoliosis  Spinal stenosis s/p steroids injection  s/p bilateral knee replacements for valgus /arthritic  complications, performed by Dr. Katrinka Blazing 2010--- never infected, but  packed with antibiotics with surgery;  Right rotator cuff repairs x 4, complicated  by repeat tears,  infection, placement of anchor material  Elevated liver function tests  Diabetes  Seronegative RA  GI bleed  GERD  .  ALLERGIES  .  N.K.D.A.  .  SOCIAL HISTORY  .  .  Tobacco  history: Never smoked.  .  Marital Status Single.  .  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  .  Alcohol Former daily EtOH use in 20s.  .  Abuse/NeglectDo you feel unsafe in your relationships?No ,  PlanResources Provided, Patient states they feel safe to return  home , Have you ever been hit, kicked, punched or otherwise hurt  by someone in the past year? No.  .  Illicit drugs: Denies.  .  Nonsmoker.Lives with longstanding boyfriend.  [name]  REVIEW OF SYSTEMS  .  Rheumatology:  .  General    No fever, weight loss, swollen glands . Muscoskeletal     +Joint pain, +joint swelling, +morning stiffness  . Eyes    No  vision loss, eye dry, red eye, eye pain . Mouth    No mouth sores  . Cardiovascular    ? raynaud, no chest pain , irregular heart  beat, racing heart beat, leg swellingUpcoming Stress Test .  Pulmonary    No cough, cough blood, shortness of breath . Skin     No rash, photosensitivity . Lymphatics    No swollen glands,  tender glands .  [name]  EXAMINATION  .  Rheumatology:  General Appearance: Alert and oriented , No apparent distress .  Eyes: No, scleral icterus, scleral erythema .  Pulm:speeking in full sentances, no apparent shortness of breath  or cough.  Skin:No rashes or nail changes, mo obvious digital ulceration.  MuskuloskeletalElbows, shoulders, hips, knees have intact ROM.  Hands have apparent synovitis in the 2-3 PIPs and MCPs  bilaterally..  Skin and Nails Minimal sclerodactyly, no cracking atthe  fingertips, no ulcers.  .  ASSESSMENTS  .  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  Scleroderma - M34.9  .  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  Long-term use of high-risk medication - Z79.899  .  PFO (patent foramen ovale) - Q21.1  .  She has had some mild improvement after 2  weeks on CellCept.  However she remains disabled and has a long way to go. I am  encouraged that she has had responded. Given her CVID, I  recommend we hold at the current dose of 1000 mg daily and watch  how things progress over then coming 2-3 weeks. She is at risk  for infection but continues to get IVIG infusions which should  hopefully protect her. I will see her in another 2-3 weeks to  evaluate whether we she is responding more or we should push the  dose.Her GI and cardiology concerns are stable for now, but we  will have to be vigilant looking for further GI bleed or any  signs of clotting disorder, though she would be at high risk for  anticoagulation given recent bleed. She is recovering from her  hamstring injury and I think we should try to taper the steroids  which are surely contributing to her tendinoplasty. But I don't  think we can do that until she gets a little better with the  CellCept. I encouraged her to continue PT.She will need labs and  an full exam in the next 2-3 weeks. I will plan to follow up with  her at that time and make a decision about her immunosuppression.  She has received COVID vaccination, both doses. She knows to call  or go to the ED immediately with any new leg swelling, melena or  shortness of breath.  .  FOLLOW UP  .  3 Weeks - 4 Weeks  .  Electronically signed by Erasmo Leventhal , MD on  07/21/2019 at 08:15 PM EDT  .  Document electronically signed by [name]

## 2019-07-24 ENCOUNTER — Ambulatory Visit: Admitting: Gastroenterology

## 2019-07-24 ENCOUNTER — Ambulatory Visit

## 2019-07-24 ENCOUNTER — Ambulatory Visit: Admitting: Hepatology

## 2019-07-24 ENCOUNTER — Ambulatory Visit: Admit: 2019-07-24

## 2019-07-24 LAB — HX HEM-ROUTINE
HX BASO #: 0 10*3/uL (ref 0.0–0.2)
HX BASO: 0 %
HX EOSIN #: 0 10*3/uL (ref 0.0–0.5)
HX EOSIN: 0 %
HX HCT: 44.8 % (ref 32.0–45.0)
HX HGB: 13.7 g/dL (ref 11.0–15.0)
HX IMMATURE GRANULOCYTE#: 0.1 10*3/uL (ref 0.0–0.1)
HX IMMATURE GRANULOCYTE: 1 %
HX LYMPH #: 0.6 10*3/uL — ABNORMAL LOW (ref 1.0–4.0)
HX LYMPH: 6 %
HX MCH: 29 pg (ref 26.0–34.0)
HX MCHC: 30.6 g/dL — ABNORMAL LOW (ref 32.0–36.0)
HX MCV: 94.9 fL (ref 80.0–98.0)
HX MONO #: 0.2 10*3/uL (ref 0.2–0.8)
HX MONO: 2 %
HX MPV: 11.5 fL (ref 9.1–11.7)
HX NEUT #: 8 10*3/uL — ABNORMAL HIGH (ref 1.5–7.5)
HX NRBC #: 0 10*3/uL
HX NUCLEATED RBC: 0 %
HX PLT: 206 10*3/uL (ref 150–400)
HX RBC BLOOD COUNT: 4.72 M/uL (ref 3.70–5.00)
HX RDW: 14.6 % — ABNORMAL HIGH (ref 11.5–14.5)
HX SEG NEUT: 91 %
HX WBC: 8.8 10*3/uL (ref 4.0–11.0)

## 2019-07-24 LAB — HX CHEM-LFT
HX ALANINE AMINOTRANSFERASE (ALT/SGPT): 56 IU/L — ABNORMAL HIGH (ref 0–54)
HX ALKALINE PHOSPHATASE (ALK): 273 IU/L — ABNORMAL HIGH (ref 40–130)
HX ASPARTATE AMINOTRANFERASE (AST/SGOT): 44 IU/L — ABNORMAL HIGH (ref 10–42)
HX BILIRUBIN, DIRECT: 0.2 mg/dL (ref 0.0–0.5)
HX BILIRUBIN, TOTAL: 0.4 mg/dL (ref 0.2–1.1)
HX GAMMA GLUTAMYL TRANSFERASE (GGT): 1688 IU/L — ABNORMAL HIGH (ref 0–59)

## 2019-07-24 LAB — HX COAGULATION
HX INR PT: 0.9 (ref 0.9–1.3)
HX PROTHROMBIN TIME: 10.8 s (ref 9.7–14.0)

## 2019-07-24 LAB — HX OTHER TESTS: HX HEPATITIS C ANTIBODY: NONREACTIVE

## 2019-07-24 LAB — HX CHEM-OTHER: HX ALBUMIN: 4.3 g/dL (ref 3.4–4.8)

## 2019-07-24 NOTE — Progress Notes (Signed)
 .  Progress Notes  .  Patient: [name]  Provider: Leighton Roach  MD  .  DOB: 15-Nov-1960 Age: 59 Y Sex: Female  Supervising Provider:: Corwin Levins, MD  Date: 07/24/2019  .  PCP: Ruffin Frederick    Date: 07/24/2019  .  --------------------------------------------------------------------------------  .  HISTORY OF PRESENT ILLNESS  .  GENERAL:  [name] is a 59 yo F pt with hx  of scleroderma, prior obesity (peak BMI  40 a couple of years  ago), IgG-4 deficiency, CVID on IVIG, GERD, DM2, cholecystectomy,  hypothyroidism, and seronegative RA on chronic steroids and prior  treatment with Rituxan who presents today to the Hepatology  Clinic at [facility] today for evaluation of  chronically elevated LFTs.Patient was last seen in the Hepatology  clinic for abnormal LFTs back in 2017. Her transaminitis dates  back to 2007. Prior workup includes: AMA negative, ASMA negative,  HBV sAg non-reactive / e Ag non-reactive, c Ab non-reactive / DNA  not detected, HCV RNA not detected, Hepatitis A IgM negative,  ceruloplasmin and alpha-1 antitrypsin within normal limits,  normal iron indices and ferritin, TSH wnl, celiac serologies  negative. GGT was elevated to 1417 in 2017. US abdomen 08/2015:  Coarse and heterogeneous liver echotexture with increased  parenchymal echogenicity. Findings can represent hepatic  steatosis. Patent portal and hepatic veins. Patient had been lost  to follow up since her initial Hepatology clinic visit in  2017.Patient reports feeling well. Denies abdominal pain, n/v,  jaundice, icterus, ascites, melena, hematochezia, or  encephalopathy. Denies any recent liver related hospitalizations.  No signs or symptoms of decompensated liver failure.Most recent  liver chemistries (06/2019): AST 37, ALT 91, alk phos 302, total  bili 0.5. Also with normal platelet count and INR.Reports a  history of almost daily alcohol use in her 27s (for about 8  years), but has consumed rare EtOH for the past 20  years. Denies  any other OTC medications, herbal supplements other than vitamins  and cranberry supplement.  .  Ambulatory Falls and Injury Prevention:  HPI  .  Have you experienced a fall in the past year?No , Is the patient  using assistive devices such as a cane or walker?No , Do you need  assistance with ambulation while at our facility?No ,  Interventionsnone, patient not a fall risk , ensured environment  free from obstacles and obstructions  .  [name]  CURRENT MEDICATIONS  .  Taking Acetaminophen 500 MG Tablet 2 tablets as needed Orally  every 8 hrs, Notes: as needed  Taking Baclofen 10 MG Tablet 1/2 tab Orally prn, Notes: prn  Taking Calcium Carbonate-Vitamin D 600-200 MG-UNIT Capsule 1  capsule with a meal Orally Twice a day  Taking Famotidine 40 MG Tablet 1 tablet at bedtime Orally Once a  day, Notes: prn  Taking Gammagard - Solution as directed Injection once every 4  weeks, Notes: every 4 weeks  Taking GlipiZIDE XL 5 MG Tablet Extended Release 24 Hour 1 tablet  Orally Once a day  Taking Glucophage XR 500 mg Tablet Extended Release 24 Hour 4  tablets Orally Once a day  Taking Hydroxychloroquine Sulfate 200 MG Tablet 1 tablet with  food or milk Orally Once a day  Taking Jardiance 10 MG Tablet 1 tablet Orally Once a day  Taking Lidocaine 5 % Patch 1 patch remove after 12 hours  Externally Once a day, Notes: prn  Taking Lisinopril 2.5 MG Tablet 1 tablet Orally Once a  day  Taking Mycophenolate Mofetil 500 MG Tablet 2 tablet Orally twice  daily  Taking Omeprazole 40 mg Capsule Delayed Release 1 capsule Orally  Once a day  Taking Oxycodone HCl 5 MG Tablet 1 tablet as needed Orally every  4-6 hrs  Taking PredniSONE 10 mg Tablet 1 tablet Orally Once a day  Taking Rituxan 500 MG/50ML Solution as directed Intravenous ,  Notes: Last dose 05/19/19  Taking Tramadol HCl 50 MG Tablet 1 tablet Orally every 6 hrs,  Notes: PRN - rarely takes  Taking Vitamin B Complex - Tablet 1 tablet Orally once daily  Taking Vitamin D 25 MCG  (1000 UT) Tablet 1 tablet Orally Once a  day  Discontinued Rosuvastatin Calcium 10 mg Tablet 1 tablet Orally  Once a day  Medication List reviewed and reconciled with the patient  .  PAST MEDICAL HISTORY  .  Scleroderma - CREST dx 2007  IgG4 deficiency s/p IVIG 2010 ----single infusion given  preventively after week of bilateral knee replacements at [facility]  2010  Fx left wrist in 1994  Blood clot at LUE in 2006 on a short course of Coumadin  Bilateral carpal tunnel syndrome s/p Lt carpal tunnel release and  steroids injection right--  Bone spur left foot  Scoliosis  Spinal stenosis s/p steroids injection  s/p bilateral knee replacements for valgus /arthritic  complications, performed by Dr. Katrinka Blazing 2010--- never infected, but  packed with antibiotics with surgery;  Right rotator cuff repairs x 4, complicated by repeat tears,  infection, placement of anchor material  Elevated liver function tests  Diabetes  Seronegative RA  GI bleed  GERD  .  ALLERGIES  .  N.K.D.A.  .  SURGICAL HISTORY  .  Right rotator cuff repair, with infected hardware that had to be  removed 2006  Repeat right shoulder surgery, also which became infected. 2007  Left rotator cuff repair 2008  bilateral knee replacements 2009  Left arthroscopic carpal tunnel release 01/2011  ORIF Left 4th metatarsal bone  Left Ulna shortening 1994  Knee replacement 2010  Hand surgery 2013, 2015  remove gallbladder/hernia 1990  Plate left foot 3rd metatarsal 2008  Left reverse TSA 03/07/16  Left Hamstring Repair 05/2019  .  FAMILY HISTORY  .  Mother: deceased 29 yrs, lung cancer, hyperthyroidism, diagnosed  with Other malignant neoplasm of unspecified site  Father: deceased 36s yrs, heart attack, Unspecified heart disease  Siblings: alive, Brother - scleroderma, COPD  3 brother(s) .  Denies family history of GI cancer or liver disease.  .  SOCIAL HISTORY  .  .  Tobacco  history: Never smoked.  .  Marital Status Single.  .  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  .  Alcohol Former daily EtOH use in 20s.  .  Abuse/NeglectDo you feel unsafe in your relationships?No ,  PlanResources Provided, Patient states they feel safe to return  home , Have you ever been hit, kicked, punched or otherwise hurt  by someone in the past year? No.  .  Illicit drugs: Denies.  .  Nonsmoker.Lives with longstanding boyfriend.  [name]  HOSPITALIZATION/MAJOR DIAGNOSTIC PROCEDURE  .  as above  GI bleed 05/2019  .  REVIEW OF SYSTEMS  .  A complete 10 point ROS was obtained and otherwise negative  except as mentioned above in the HPI.  [name]  VITAL SIGNS  .  Pain scale 0, Ht-in 62, Wt-lbs 156.8, BMI 28.68, BP 119/81, HR  98%RA, BSA 1.76, Wt-kg 71.12.  [name]  PHYSICAL EXAMINATION  .  General: well developed, well nourished, in no acute  distressHEENT: normocephalic, atraumaticEyes: sclera  anictericNeck: suppleCardiovascular: regular rate and  rhythmLungs: clear to auscultation bilaterallyGI: positive bowel  sounds, soft, nontender, nondistendedExtremities: no significant  edema, warm and well perfusedNeurologic: alert and oriented times  3, grossly nonfocal, normal gaitPsychiatric: appropriate mood and  affect.  .  ASSESSMENTS  .  Abnormal liver function tests - R79.89 (Primary)  .  [name] is a 59 yo F pt with a PMHx scleroderma, prior  obesity, CVID, IgG-4 deficiency, GERD, DM2, hypothyroidism and  inflammatory arthritis who presents for evaluation of abnormal  liver chemistries.Patient has had persistent, asymptomatic  transaminase elevation dating back to 2007. She has had prior  laboratory and imaging work up from 2015-2017 that was  unremarkable. [place] in the past have shown coarsened and  heterogeneous echotexture that could be consistent with fatty  liver. DDx for her abnormal liver chesmitries include NASH/NAFLD  (prior BMI>40, metaboilc comorbidities), alcohol related fatty  liver disease (former use in her 20's), drug induced liver injury  from IVIG. Will repeat viral hepatitis and autoimmune  serologies  today as they have not been checked recently, as well as LFTs,  GGT, INR, platelet count, and albumin. Will obtain multiphasic CT  A/P liver protocol to evaluate for any structural abnormalities,  portal or hepatic vein thrombosis, or any changes that may be  suggestive of cirrhosis (given prolonged nature of her  transaminitis). Ordered Fibroscan to assess for degree of  steatosis and fibrosis of the liver given suspicion of NAFLD. We  will see the patient back in 6 weeks for follow up. If patient  has persistent or worsening transaminitis without a clearly  identifiable cause or if there is suspicion but no definitive  evidence of cirrhosis, we could consider a liver biopsy in the  future for further evaluation.  .  TREATMENT  .  Abnormal liver function tests  LAB: Albumin (ALB)  .  LAB: Alkaline Phosphatase (ALK)  .  LAB: Bilirubin, Direct (DBIL)  .  LAB: Bilirubin, Total (TBIL)  .  LAB: Aspartate aminotransferase (AST)  .  LAB: Gamma Glutamyl Transferase (GGT)  .  LAB: Alanine aminotransferase (ALT)  .  LAB: HCV Hepatitis C Antibody (HCV)  .  LAB: PT Prothrombin time/INR (PT)  .  LAB: HBV Hepatitis B Surface Antigen (HBSAG)  .  LAB: Anti Smooth Muscle Antibody (ASMA)  .  LAB: HBV Hepatitis B Surface Antibody (HBSAB)  .  LAB: HBV Hepatitis B Core Antibody (HBCOR)  .  LAB: HBV Hepatitis B Core IgM  .  LAB: CBC/DIFF with PLT (CBCWD)  .  LAB: ANA with Path Review (ANA)  .  CT ABD/PELVIS W/ [id number] Leighton Roach , MD 07/24/2019 2:10:11  PM : abnormal LFTs  Vivia Birmingham , MD 07/24/2019 2:09:21 PM : abnormal LFTs,  hepatic steatosis  Notes: 1) Schedule multiphasic CT A/P with contrast, liver  protocol  2) Labs today  3) Schedule Fibroscan  4) Weight loss, diet modification (low fat, low sugar) and  exercise  5) RTC in 6 weeks.  .  FOLLOW UP  .  6 Weeks  .  [name]  Appointment Provider: Leighton Roach, MD  .  Electronically signed by Corwin Levins , MD on  07/24/2019 at 03:45 PM EDT  .  CONFIRMATORY SIGN  OFF  HUH,ALEX , MD 07/24/2019 2:25:37 PM > , I personally interviewed and examined the patient and both the fellow and I contributed to  this electronic note. I agree with the history, exam, assessment and plan as detailed in this note and edited it as necessary. [place] [name] 07/24/2019 3:45:20 PM >   .  Document electronically signed by Leighton Roach  MD  .

## 2019-07-24 NOTE — Progress Notes (Signed)
 [name] [name], [name] [name] **DOB:** 03/29/1961 (59 yo F) **Acc No.** [id number] **DOS:**  07/24/2019    ---       [name] [name], [name] [name]**    ------    59 Y old Female, DOB: Aug 23, 1960, External MRN: [id number]    Account Number: [id number]    [address], [place], [id number]    Home: [phone]    Guarantor: [name] [name] Insurance: [id number] NH[employer] PPO    PCP: Ruffin Frederick Referring: Ruffin Frederick    [facility]oint[facility] Facility[facility]        * * *    07/24/2019  **Appointment Provider:** Leighton Roach, MD **CHN#:** [id number]    ------     **Supervising Provider:** Corwin Levins, MD    ---       **History of Present Illness**    ---     _GENERAL_ :    [name] [name] is a 59 yo F pt with hx of scleroderma, prior obesity (peak  BMI~40 a couple of years ago), IgG-4 deficiency, 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    Patient was last seen in the Hepatology clinic for abnormal LFTs back in 2017.  Her transaminitis dates back to 2007. Prior workup includes: AMA negative,  ASMA negative, HBV sAg non-reactive / e Ag non-reactive, c Ab non-reactive /  DNA not detected, HCV RNA not detected, Hepatitis A IgM negative,  ceruloplasmin and alpha-1 antitrypsin within normal limits, normal iron  indices and ferritin, TSH wnl, celiac serologies negative. GGT was elevated to  1417 in 2017. US abdomen 08/2015: Coarse and heterogeneous liver echotexture  with increased parenchymal echogenicity. Findings can represent hepatic  steatosis. Patent portal and hepatic veins. Patient had been lost to follow up  since her initial Hepatology clinic visit in 2017.    Patient reports feeling well. Denies abdominal pain, n/v, jaundice, icterus,  ascites, melena, hematochezia, or encephalopathy. Denies any recent liver  related  hospitalizations. No signs or symptoms of decompensated liver failure.    Most recent liver chemistries (06/2019): AST 37, ALT 91, alk phos 302, total  bili 0.5. Also with normal platelet count and INR.    Reports a history of almost daily alcohol use in her 66s (for about 8 years),  but has consumed rare EtOH for the past 20 years. Denies any other OTC  medications, herbal supplements other than vitamins and cranberry supplement.     _Ambulatory Falls and Injury Prevention_ :    HPI Have you experienced a fall in the past year? No, Is the patient using  assistive devices such as a cane or walker? No, Do you need assistance with  ambulation while at our facility? No, Interventions none, patient not a fall  risk , ensured environment free from obstacles and obstructions .     **Current Medications**    ---    Taking    * Acetaminophen 500 MG Tablet 2 tablets as needed Orally every 8 hrs, Notes: as needed    ---    * Baclofen 10 MG Tablet 1/2 tab Orally prn, Notes: prn    ---    * Calcium Carbonate-Vitamin D 600-200 MG-UNIT Capsule 1 capsule with a meal Orally Twice a day    ---    * Famotidine 40  MG Tablet 1 tablet at bedtime Orally Once a day, Notes: prn    ---    * Gammagard - Solution as directed Injection once every 4 weeks, Notes: every 4 weeks    ---    * GlipiZIDE XL 5 MG Tablet Extended Release 24 Hour 1 tablet Orally Once a day    ---    * Glucophage XR 500 mg Tablet Extended Release 24 Hour 4 tablets Orally Once a day    ---    * Hydroxychloroquine Sulfate 200 MG Tablet 1 tablet with food or milk Orally Once a day    ---    * Jardiance 10 MG Tablet 1 tablet Orally Once a day    ---    * Lidocaine 5 % Patch 1 patch remove after 12 hours Externally Once a day, Notes: prn    ---    * Lisinopril 2.5 MG Tablet 1 tablet Orally Once a day    ---    * Mycophenolate Mofetil 500 MG Tablet 2 tablet Orally twice daily    ---    * Omeprazole 40 mg Capsule Delayed Release 1 capsule Orally Once a day    ---    *  Oxycodone HCl 5 MG Tablet 1 tablet as needed Orally every 4-6 hrs    ---    * PredniSONE 10 mg Tablet 1 tablet Orally Once a day    ---    * Rituxan 500 MG/50ML Solution as directed Intravenous , Notes: Last dose 05/19/19    ---    * Tramadol HCl 50 MG Tablet 1 tablet Orally every 6 hrs, Notes: PRN - rarely takes    ---    * Vitamin B Complex - Tablet 1 tablet Orally once daily    ---    * Vitamin D 25 MCG (1000 UT) Tablet 1 tablet Orally Once a day    ---    Discontinued    * Rosuvastatin Calcium 10 mg Tablet 1 tablet Orally Once a day    ---    * Medication List reviewed and reconciled with the patient    ---      **Past Medical History**    ---      Scleroderma - CREST dx 2007.        ---    IgG4 deficiency s/p IVIG 2010 ----single infusion given preventively after  week of bilateral knee replacements at [facility] 2010.        ---    Fx left wrist in 1994.        ---    Blood clot at LUE in 2006 on a short course of Coumadin.        ---    Bilateral carpal tunnel syndrome s/p Lt carpal tunnel release and steroids  injection right--.        ---    Bone spur left foot.        ---    Scoliosis.        ---    Spinal stenosis s/p steroids injection.        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 Katrinka Blazing 2010--- never infected, but packed with antibiotics with  surgery;.        ---    Right rotator cuff repairs x 4, complicated by repeat tears, infection,  placement of anchor material.        ---    Elevated liver  function tests.        ---    Diabetes.        ---    Seronegative RA.        ---    GI bleed .        ---    GERD.        ---      **Surgical History**    ---      Right rotator cuff repair, with infected hardware that had to be removed  2006    ---    Repeat right shoulder surgery, also which became infected. 2007    ---    Left rotator cuff repair 2008    ---    bilateral knee replacements 2009    ---    Left arthroscopic carpal tunnel release 01/2011    ---     ORIF Left 4th metatarsal bone    ---    Left Ulna shortening 1994    ---    Knee replacement 2010    ---    Hand surgery 2013, 2015    ---    remove gallbladder/hernia 1990    ---    Plate left foot 3rd metatarsal 2008    ---    Left reverse TSA 03/07/16    ---    Left Hamstring Repair 05/2019    ---      **Family History**    ---      Mother: deceased 16 yrs, lung cancer, hyperthyroidism, diagnosed with Other  malignant neoplasm of unspecified site    ---    Father: deceased 31s yrs, heart attack, Unspecified heart disease    ---    Siblings: alive, Brother - scleroderma, COPD    ---    3 brother(s) .    ---    Denies family history of GI cancer or liver disease.    ---      **Social History**    ---    Tobacco history: Never smoked.    Marital Status  Single.    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    Alcohol  Former daily EtOH use in 20s.    Abuse/Neglect  Do you feel unsafe in your relationships? No, Plan Resources  Provided, Patient states they feel safe to return home, Have you ever been  hit, kicked, punched or otherwise hurt by someone in the past year? No.    Illicit drugs: Denies.  Nonsmoker.    Lives with longstanding boyfriend.    ---      **Allergies**    ---      N.K.D.A.    ---      **Hospitalization/Major Diagnostic Procedure**    ---      as above    ---    GI bleed 05/2019    ---      **Review of Systems**    ---    A complete 10 point ROS was obtained and otherwise negative except as  mentioned above in the HPI.      **Vital Signs**    ---    Pain scale 0, Ht-in 62, Wt-lbs 156.8, BMI 28.68, BP 119/81, HR 98%RA, BSA  1.76, Wt-kg 71.12.      **Physical Examination**    ---    General: well developed, well nourished, in no acute distress    HEENT: normocephalic, atraumatic    Eyes: sclera anicteric    Neck: supple    Cardiovascular: regular rate  and rhythm    Lungs: clear to auscultation bilaterally    GI: positive bowel sounds, soft, nontender, nondistended    Extremities:  no significant edema, warm and well perfused    Neurologic: alert and oriented times 3, grossly nonfocal, normal gait    Psychiatric: appropriate mood and affect.      **Assessments**    ---    1\. Abnormal liver function tests - R79.89 (Primary)    ---     [name] [name] is a 59 yo F pt with a PMHx scleroderma, prior obesity,  CVID, IgG-4 deficiency, GERD, DM2, hypothyroidism and inflammatory arthritis  who presents for evaluation of abnormal liver chemistries.    Patient has had persistent, asymptomatic transaminase elevation dating back to  2007. She has had prior laboratory and imaging work up from 2015-2017 that was  unremarkable. [place] in the past have shown coarsened and heterogeneous  echotexture that could be consistent with fatty liver. DDx for her abnormal  liver chesmitries include NASH/NAFLD (prior BMI>40, metaboilc comorbidities),  alcohol related fatty liver disease (former use in her 20's), drug induced  liver injury from IVIG. Will repeat viral hepatitis and autoimmune serologies  today as they have not been checked recently, as well as LFTs, GGT, INR,  platelet count, and albumin. Will obtain multiphasic CT A/P liver protocol to  evaluate for any structural abnormalities, portal or hepatic vein thrombosis,  or any changes that may be suggestive of cirrhosis (given prolonged nature of  her transaminitis). Ordered Fibroscan to assess for degree of steatosis and  fibrosis of the liver given suspicion of NAFLD. We will see the patient back  in 6 weeks for follow up. If patient has persistent or worsening transaminitis  without a clearly identifiable cause or if there is suspicion but no  definitive evidence of cirrhosis, we could consider a liver biopsy in the  future for further evaluation.    ---      **Treatment**    ---      **1\. Abnormal liver function tests**    _LAB: Albumin (ALB)_    _LAB: Alkaline Phosphatase (ALK)_    _LAB: Bilirubin, Direct (DBIL)_    _LAB: Bilirubin, Total (TBIL)_     _LAB: Aspartate aminotransferase (AST)_    _LAB: Gamma Glutamyl Transferase (GGT)_    _LAB: Alanine aminotransferase (ALT)_    _LAB: HCV Hepatitis C Antibody (HCV)_    _LAB: PT Prothrombin time/INR (PT)_    _LAB: HBV Hepatitis B Surface Antigen (HBSAG)_    _LAB: Anti Smooth Muscle Antibody (ASMA)_    _LAB: HBV Hepatitis B Surface Antibody (HBSAB)_    _LAB: HBV Hepatitis B Core Antibody (HBCOR)_    _LAB: HBV Hepatitis B Core IgM_    _LAB: CBC/DIFF with PLT (CBCWD)_    _LAB: ANA with Path Review (ANA)_    _IMAGING: CT ABD/PELVIS W/ CONTRAST_   Notes :Dondi Burandt , MD 07/24/2019  2:10:11 PM : abnormal LFTs    ------        _PROCEDURE: Fibroscan_  Notes Leighton Roach , MD 07/24/2019 2:09:21 PM : abnormal  LFTs, hepatic steatosis    ------        Notes: 1) Schedule multiphasic CT A/P with contrast, liver protocol    2) Labs today    3) Schedule Fibroscan    4) Weight loss, diet modification (low fat, low sugar) and exercise    5) RTC in 6 weeks.     **Follow Up**    ---    6  Weeks    **Appointment Provider:** Leighton Roach, MD    Electronically signed by Corwin Levins , MD on 07/24/2019 at 03:45 PM EDT    Sign off status: Completed        * * *        GI Clinic    [address]    [place], [place] [address]    Tel: [phone]    Fax: [phone]              * * *          Progress Note: Leighton Roach, MD 07/24/2019    ---    Note generated by eClinicalWorks EMR/PM Software ([email or web address])

## 2019-07-25 ENCOUNTER — Ambulatory Visit

## 2019-07-25 NOTE — Telephone Encounter (Signed)
 Call Details:   Patient PCP = [name] -JN 6A-  [name] (Patient) called on July 25, 2019 1:50 PM.  Message taken by: Bartholomew Crews  Primary call-back number: [phone]    Secondary call-back number: () -    Call Reason(s): Message/Call-Back      ** MESSAGE / CALL-BACK.  Regarding: pt would like a referral to Engineer, petroleum at [facility] Please contact the pt     ---------- ---------- ---------- ---------- ---------- ----------       RESPONSE/ORDERS:    spoke w/ pt want to be referred to plastic   pt lost a lot of weight, havingissue w/ excess skin    wants to be referred to plastics  pt states she had multiple conversation w/ Dr Marlane Hatcher and Dr Llana Aliment and is now ready to move forward    ......................................Marland KitchenKandis Fantasia, RN  July 25, 2019 2:11 PM  '  sure.  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.1)  SCREENING EXAM FOR BREAST CANCER (ICD-V76.10) (ICD10-Z12.39)  ANEMIA, OTHER (ICD-285.8) (ICD10-D64.89)  LOWER EXTREMITY EDEMA (ICD-782.3) (ICD10-R60.0)  IRREGULAR HEART BEATS (ICD-427.9) (ICD10-I49.9)  GI BLEED (ICD-578.9) (VPL68-Z99.2)  SCLERODERMA (ICD-710.1) (ICD10-M34.9)  SCREENING FOR CERVICAL CANCER (ICD-V76.2) (UFC14-Q36.4)  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) (ICD10-M47.812)  NECK PAIN (ICD-723.1) (ICD10-M54.2)  STEROID USE, LONG TERM (ICD-V58.65) (ICD10-Z79.52)  PREMATURE VENTRICULAR CONTRACTIONS (ICD-427.69) (ICD10-I49.3)  HYPERLIPIDEMIA (ICD-272.4) (ICD10-E78.5)  BACK PAIN, LUMBAR, CHRONIC (ICD-724.2) (ICD10-M54.5)  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)  (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)  DIABETES MELLITUS, TYPE II (ICD-250.00) (ICD10-E11.9)  LONG-TERM (CURRENT) USE OF STEROIDS (ICD-V58.65) (ICD10-Z79.51)  SKIN SAGGING DUE TO WEIGHT LOSS (ICD-757.9) (ICD10-Q84.9)  TRANSAMINASES, SERUM, ELEVATED (ICD-790.4) (ICD10-R74.0)  OBESITY, BMI 30-34.9, ADULT (ICD-278.00) (ICD10-E66.9)      DIABETES MELLITUS, TYPE II, UNCONTROLLED (ICD-250.02) (ICD10-E11.65)      FATTY LIVER DISEASE (ICD-571.8) (ICD10-K76.0)  SCLERODERMA, 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)  HYPOTHYROIDISM (ICD-244.9) (ICD10-E03.9)  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) (ICD10-D80.8)  SPINAL STENOSIS, 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)  HEPATITIS C EXPOSURE (HCV RNA NEGATIVE, 01/2014) (ICD-V02.62) (ICD10-Z20.5)  PAIN IN JOINT, HAND (ICD-719.44) (ICD10-M79.643)  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)  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)  CARPAL TUNNEL (ICD-354.0) (ICD10-G56.00)  OSTEOARTHRITIS (ICD-715.09) (ICD10-M15.9)  S/P ROTATOR CUFF SURGERY 12/2004 - RT SHOULDER; 2008 LT (ICD-V45.89)  Family Hx of MELANOMA, FAMILY HX (ICD-V16.8) (ICD10-Z80.8)  FRACTURE OF OTHER SPEC SITE,  PATHOLOGIC - MULTIPLE (ICD-733.19)  CHOLECYSTECTOMY AND HERNIA REPAIR (ICD-V45.89)  VITAMIN D DEFICIENCY (ICD-268.9) (ICD10-E55.9)  COLONOSCOPY, NEXT 2020- SEE COMMENT (ICD-V76.51) (ICD10-Z01.89)    Meds (prior to this call):   HYDROXYCHLOROQUINE SULFATE 200 MG ORAL TABLET (HYDROXYCHLOROQUINE SULFATE) one tablet oral daily; Route: ORAL  JARDIANCE 10 MG ORAL TABLET (EMPAGLIFLOZIN) Take 1 tablet by mouth once daily; Route: ORAL  ROSUVASTATIN 10MG  TABLETS (ROSUVASTATIN CALCIUM) TAKE 1 TABLET BY MOUTH EVERY DAY  LISINOPRIL 2.5MG  TABLETS (LISINOPRIL) TAKE 1 TABLET BY MOUTH ONCE DAILY  METFORMIN HCL ER 500 MG XR24H-TAB (METFORMIN HCL) TAKE 4 TABLETS BY MOUTH EVERY MORNING  GLIPIZIDE ER 2.5 MG XR24H-TAB (GLIPIZIDE) TAKE 1 TABLET BY  MOUTH DAILY  OMEPRAZOLE 20 MG ORAL CAPSULE DELAYED RELEASE (OMEPRAZOLE) Take one capsule by mouth  twice a day; Route: ORAL  BACLOFEN 10MG  TABLETS (BACLOFEN) TAKE 1/2 TABLET BY MOUTH EVERY DAY AS NEEDED FOR BACK OR SPASMS  DICLOFENAC SODIUM 1 % TRANSDERMAL GEL (DICLOFENAC SODIUM) APP 2 GRAMS EXT AA BID      FREESTYLE FREEDOM LITE w/Device KIT (BLOOD GLUCOSE MONITORING SUPPL) use as directed (ICD 10 E11.65)      FREESTYLE LITE BLOOD GLUCOSE STRIPS (GLUCOSE BLOOD) USE TO CHECK BLOOD GLUCOSE THREE TIMES DAILY      FREESTYLE LANCETS (LANCETS) check fingerstick three times a day (ICD 10 E11.65)  TRAMADOL HCL 50 MG ORAL TABLET (TRAMADOL HCL) take one tablet daily as needed for pain; Route: ORAL  OXYCODONE HCL 5 MG ORAL TABLET (OXYCODONE HCL) Take one tablet daily as needed for pain >8. Partial fill upon patient request.; Route: ORAL  CALCIUM CARBONATE-VITAMIN D 600-200 MG-UNIT ORAL TABLET (CALCIUM CARBONATE-VITAMIN D) take one tablet twice a day; Route: ORAL  GABAPENTIN 300MG  CAPSULES (GABAPENTIN) TAKE 1 CAPSULE BY MOUTH EVERY NIGHT AT BEDTIME AS NEEDED FOR PAIN  * BLOODWORK Please draw chemistry (sodium, potassium, magnesium, chloride, bicarbonate, glucose, creatinine, BUN), CBC, TSH, lipids, HbA1c. Fax result to [phone] Dr. Marlane Hatcher  PREDNISONE 10 MG ORAL TABLET (PREDNISONE) Take 3 tablets once a day; Route: ORAL  FAMOTIDINE 20 MG TABLET (FAMOTIDINE) TAKE 1 TABLET BY MOUTH AT NIGHT AS NEEDED  IRON 325 (65 FE) MG ORAL TABLET (FERROUS SULFATE) Take one tablet once a day; Route: ORAL            Created By Bartholomew Crews on 07/25/2019 at 01:50 PM    Electronically Signed By Ruffin Frederick MD on 07/25/2019 at 04:09 PM

## 2019-07-28 LAB — HX IMMUNOLOGY
HX ANA TITER: 1:1280 {titer} — AB
HX ANTI NUCLEAR ANTIBODY SCREEN: REACTIVE — AB
HX ANTI SMOOTH MUSCLE ANTIBODY: <1:20 {titer}
HX HEPATITIS B CORE ANTIBODY: NONREACTIVE
HX HEPATITIS B CORE IGM: NONREACTIVE
HX HEPATITIS B SURFACE ANTIBODY: 164.3 m[IU]/mL — ABNORMAL HIGH (ref 0.0–7.9)
HX HEPATITIS B SURFACE ANTIGEN: NONREACTIVE
HX HEPATITIS C ANTIBODY: NONREACTIVE

## 2019-07-29 ENCOUNTER — Ambulatory Visit: Admitting: Student in an Organized Health Care Education/Training Program

## 2019-08-04 ENCOUNTER — Ambulatory Visit: Admitting: Physical Medicine & Rehabilitation

## 2019-08-04 ENCOUNTER — Ambulatory Visit

## 2019-08-04 ENCOUNTER — Ambulatory Visit: Admit: 2019-08-04

## 2019-08-04 NOTE — Progress Notes (Signed)
 .  Progress Notes  .  Patient: [name], [name] [facility]  [name]    .  DOB: May 18, 1960 Age: 59 Y Sex: Female  .  PCP: Ruffin Frederick    Date: 08/04/2019  .  --------------------------------------------------------------------------------  .  REASON FOR APPOINTMENT  .  1. 75M FU  .  HISTORY OF PRESENT ILLNESS  .  General:   [name] for follow-up of right sided buttock and lateral  leg pain. She was last seen on 07-03-2019 at which time, we did  trigger point injections. She reports near complete relief for 2  wks. In the last 10 days, the pain has started to return in the  lateral leg, as well as in her back. She has a history of spinal  stenosis. Her pain today is graded 8/10. She has continued  physical therapy, also doing E-stim and dry needling for 3  sessions. She gets about 2-2.5 days of relief with each session.  She denies any weakness, numbness or tingling into the right leg.  She denies any new bowel or bladder dysfunction.  .  CURRENT MEDICATIONS  .  Taking Acetaminophen 500 MG Tablet 2 tablets as needed Orally  every 8 hrs, Notes: as needed  Taking Baclofen 10 MG Tablet 1/2 tab Orally prn, Notes: prn  Taking Calcium Carbonate-Vitamin D 600-200 MG-UNIT Capsule 1  capsule with a meal Orally Twice a day  Taking Famotidine 40 MG Tablet 1 tablet at bedtime Orally Once a  day, Notes: prn  Taking Gammagard - Solution as directed Injection once every 4  weeks, Notes: every 4 weeks  Taking GlipiZIDE XL 5 MG Tablet Extended Release 24 Hour 1 tablet  Orally Once a day  Taking Glucophage XR 500 mg Tablet Extended Release 24 Hour 4  tablets Orally Once a day  Taking Hydroxychloroquine Sulfate 200 MG Tablet 1 tablet with  food or milk Orally Once a day  Taking Jardiance 10 MG Tablet 1 tablet Orally Once a day  Taking Lidocaine 5 % Patch 1 patch remove after 12 hours  Externally Once a day, Notes: prn  Taking Lisinopril 2.5 MG Tablet 1 tablet Orally Once a day  Taking Mycophenolate Mofetil 500 MG Tablet 2  tablet Orally twice  daily  Taking Omeprazole 40 mg Capsule Delayed Release 1 capsule Orally  Once a day  Taking Oxycodone HCl 5 MG Tablet 1 tablet as needed Orally every  4-6 hrs  Taking PredniSONE 10 mg Tablet 1 tablet Orally Once a day  Taking Rituxan 500 MG/50ML Solution as directed Intravenous ,  Notes: Last dose 05/19/19  Taking Tramadol HCl 50 MG Tablet 1 tablet Orally every 6 hrs,  Notes: PRN - rarely takes  Taking Vitamin B Complex - Tablet 1 tablet Orally once daily  Taking Vitamin D 25 MCG (1000 UT) Tablet 1 tablet Orally Once a  day  .  PAST MEDICAL HISTORY  .  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 Katrinka Blazing 2010--- never infected, but  packed with antibiotics with surgery;  Right rotator cuff repairs x 4, complicated by repeat tears,  infection, placement of anchor material  Elevated liver function tests  Diabetes  Seronegative RA  GI bleed  GERD  .  ALLERGIES  .  N.K.D.A.  .  SURGICAL HISTORY  .  Right rotator cuff repair, with infected hardware that had to be  removed 2006  Repeat right shoulder surgery, also which became infected. 2007  Left rotator cuff repair 2008  bilateral knee replacements 2009  Left arthroscopic carpal tunnel release 01/2011  ORIF Left 4th metatarsal bone  Left Ulna shortening 1994  Knee replacement 2010  Hand surgery 2013, 2015  remove gallbladder/hernia 1990  Plate left foot 3rd metatarsal 2008  Left reverse TSA 03/07/16  Left Hamstring Repair 05/2019  .  FAMILY HISTORY  .  Mother: deceased 52 yrs, lung cancer, hyperthyroidism,  diagnosed  with Other malignant neoplasm of unspecified site  Father: deceased 42s yrs, heart attack, Unspecified heart disease  Siblings: alive, Brother - scleroderma, COPD  3 brother(s) .  Denies family history of GI cancer or liver disease.  .  SOCIAL HISTORY  .  .  Tobacco  history: Never smoked.  .  Marital Status Single.  .  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  .  Alcohol Former daily EtOH use in 20s.  .  Abuse/NeglectDo you feel unsafe in your relationships?No ,  PlanResources Provided, Patient states they feel safe to return  home , Have you ever been hit, kicked, punched or otherwise hurt  by someone in the past year? No.  .  Illicit drugs: Denies.  .  Nonsmoker.Lives with longstanding boyfriend.  [name]  HOSPITALIZATION/MAJOR DIAGNOSTIC PROCEDURE  .  as above  GI bleed 05/2019  .  REVIEW OF SYSTEMS  .  ORT:  .  Eyes    No . Ear, Nose Throat    No . Digestion, Stomach, Bowel     No . Bladder Problems    No . Bleeding Problems    No .  Numbness/Tingling    No . Anxiety/Depression    No .  Fever/Chills/Fatigue    No . Chest Pain/Tightness/Palpitations     No . Skin Rash    No . Dental Problems    No . Joint/Muscle  Pain/Cramps    Yes . Blackout/Fainting    No . Other    No .  .  VITAL SIGNS  .  Pain scale 8, Ht-in 62, Wt-lbs 154, BMI 28.16, BSA 1.75, Ht-cm  157.48, Wt-kg 69.85, Wt Change -2.8 lb.  .  PHYSICAL EXAMINATION  .  General: NAD Psych: Mood and affect appropriate HEENT: NC/AT CV:  warm and well perfused Pulm: breathing unlabored, normal chest  expansions Skin: No rash, swelling, ecchymoses, or erythemaGait:  not antalgic; able to heel, toe and tandem walk(+) TTP over right  gluteus maximus, right piriformis, right TFL, GTB; left gluteus  maximus5/5 motor strength B/LSensation intact to light touch in  all dermatomesReflex 1+ B/L patellar and Achilles.  .  ASSESSMENTS  .  Trochanteric bursitis, right hip - M70.61 (Primary)  .  Iliotibial band syndrome of right side - M76.31  .  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  Myofascial pain - M79.18  .  [name] is a 59 year old female with recent left proximal  tear fixed surgically, who presents with persistent right buttock  and lateral leg pain. Her history and physical examination are  most consistent with myofascial pain  coming from gluteus maximus,  piriformis and tight ITB/TFL which improved significantly with  trigger point injections at the last visit. Pt educated regarding  diagnoses and treatment options, and tiers of treatment  discussed. Trigger point injections were performed today, see  procedure note below. Patient will track her symptoms in physical  therapy, continue Graston technique and dry needling. She will  f/u in 4 wks to see if injections helped at which time, we can  consider repeating if she got some relief. All questions  answered. A total of 30 minutes was spent preparing for and  during this encounter including: review of imaging, performing  medically appropriate examination, counseling/education regarding  treatment plans/injections and clinical documentation into the  electronic health record.  [name]  PROCEDURES  .  Myofascial trigger point injectionsProcedure  performed by: Patrick North, DOInjectate: 10ml total volume  containing 4ml bupivacaine 0.25% with 6ml lidocaine 1% without  epinephrineAfter risks, benefits and alternative treatment  options and prognosis were discussed, the patient signed informed  consent. The patient's upper back was examined for trigger  points, with 6 total points found in the following muscles: right  gluteus maximus, piriformis, TFL, GTB; left gluteus maximus.  These trigger points were cleaned and prepped using alcohol. A  25G 1.5" needle was then inserted and after negative aspiration,  the above injectate was delivered with subsequent dry needling at  each identified. The patient tolerated the procedure well and  there were no complications, she was given  post-procedure  instructions before discharge.  [name]  PROCEDURE CODES  .  57846 Trigger Point Inject >= 3 musc, Modifiers: 50  .  FOLLOW UP  .  4 Weeks  .  Electronically signed by [name] on 08/04/2019  at 11:10 AM EDT  .  Document electronically signed by [name]    .

## 2019-08-04 NOTE — Progress Notes (Signed)
 [name], [name] **DOB:** 1960/05/31 (59 yo F) **Acc No.** [id number] **DOS:**  08/04/2019    ---       [name], [name]**    ------    60 Y old Female, DOB: Aug 17, 1960, External MRN: [id number]    Account Number: [id number]    [address], [place], [place]    Home: [phone]    Guarantor: [name] Insurance: [id number] PPO    PCP: [name] Referring: [name] External Visit ID: [id number]    [facility]        * * *    08/04/2019 Progress Notes: [name] **CHN#:** [id number]    ------    ---       **Current Medications**    ---    Taking    * Acetaminophen 500 MG Tablet 2 tablets as needed Orally every 8 hrs, Notes: as needed    ---    * Baclofen 10 MG Tablet 1/2 tab Orally prn, Notes: prn    ---    * Calcium Carbonate-Vitamin D 600-200 MG-UNIT Capsule 1 capsule with a meal Orally Twice a day    ---    * Famotidine 40 MG Tablet 1 tablet at bedtime Orally Once a day, Notes: prn    ---    * Gammagard - Solution as directed Injection once every 4 weeks, Notes: every 4 weeks    ---    * GlipiZIDE XL 5 MG Tablet Extended Release 24 Hour 1 tablet Orally Once a day    ---    * Glucophage XR 500 mg Tablet Extended Release 24 Hour 4 tablets Orally Once a day    ---    * Hydroxychloroquine Sulfate 200 MG Tablet 1 tablet with food or milk Orally Once a day    ---    * Jardiance 10 MG Tablet 1 tablet Orally Once a day    ---    * Lidocaine 5 % Patch 1 patch remove after 12 hours Externally Once a day, Notes: prn    ---    * Lisinopril 2.5 MG Tablet 1 tablet Orally Once a day    ---    * Mycophenolate Mofetil 500 MG Tablet 2 tablet Orally twice daily    ---    * Omeprazole 40 mg Capsule Delayed Release 1 capsule Orally Once a day    ---    * Oxycodone HCl 5 MG Tablet 1 tablet as needed Orally every 4-6 hrs    ---    * PredniSONE 10 mg Tablet 1 tablet Orally Once a day    ---    * Rituxan 500 MG/50ML Solution as directed Intravenous , Notes: Last  dose 05/19/19    ---    * Tramadol HCl 50 MG Tablet 1 tablet Orally every 6 hrs, Notes: PRN - rarely takes    ---    * Vitamin B Complex - Tablet 1 tablet Orally once daily    ---    * Vitamin D 25 MCG (1000 UT) Tablet 1 tablet Orally Once a day    ---     Past Medical History    ---      Scleroderma - CREST dx 2007.        ---    IgG4 deficiency s/p IVIG 2010 ----single infusion given preventively after  week of bilateral knee replacements at [facility] 2010.        ---  Fx left wrist in 1994.        ---    Blood clot at LUE in 2006 on a short course of Coumadin.        ---    Bilateral carpal tunnel syndrome s/p Lt carpal tunnel release and steroids  injection right--.        ---    Bone spur left foot.        ---    Scoliosis.        ---    Spinal stenosis s/p steroids injection.        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 Katrinka Blazing 2010--- never infected, but packed with antibiotics with  surgery;.        ---    Right rotator cuff repairs x 4, complicated by repeat tears, infection,  placement of anchor material.        ---    Elevated liver function tests.        ---    Diabetes.        ---    Seronegative RA.        ---    GI bleed .        ---    GERD.        ---      **Surgical History**    ---      Right rotator cuff repair, with infected hardware that had to be removed  2006    ---    Repeat right shoulder surgery, also which became infected. 2007    ---    Left rotator cuff repair 2008    ---    bilateral knee replacements 2009    ---    Left arthroscopic carpal tunnel release 01/2011    ---    ORIF Left 4th metatarsal bone    ---    Left Ulna shortening 1994    ---    Knee replacement 2010    ---    Hand surgery 2013, 2015    ---    remove gallbladder/hernia 1990    ---    Plate left foot 3rd metatarsal 2008    ---    Left reverse TSA 03/07/16    ---    Left Hamstring Repair 05/2019    ---      **Family History**    ---      Mother: deceased 52 yrs, lung  cancer, hyperthyroidism, diagnosed with Other  malignant neoplasm of unspecified site    ---    Father: deceased 103s yrs, heart attack, Unspecified heart disease    ---    Siblings: alive, Brother - scleroderma, COPD    ---    3 brother(s) .    ---    Denies family history of GI cancer or liver disease.    ---      **Social History**    ---    Tobacco history: Never smoked.    Marital Status  Single.    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    Alcohol  Former daily EtOH use in 20s.    Abuse/Neglect Do you feel unsafe in your relationships? No, Plan Resources  Provided, Patient states they feel safe to return home, Have you ever been  hit, kicked, punched or otherwise hurt by someone in the past year? No.    Illicit drugs: Denies.  Nonsmoker.    Lives with longstanding  boyfriend.    ---      **Allergies**    ---      N.K.D.A.    ---    [name] Verified]      **Hospitalization/Major Diagnostic Procedure**    ---      as above    ---    GI bleed 05/2019    ---      **Review of Systems**    ---     _ORT_ :    Eyes No. Ear, Nose Throat No. Digestion, Stomach, Bowel No. Bladder Problems  No. Bleeding Problems No. Numbness/Tingling No. Anxiety/Depression No.  Fever/Chills/Fatigue No. Chest Pain/Tightness/Palpitations No. Skin Rash No.  Dental Problems No. Joint/Muscle Pain/Cramps Yes. Blackout/Fainting No. Other  No.          **Reason for Appointment**    ---      1\. 38M FU    ---      **History of Present Illness**    ---     _General_ :    [name] for follow-up of right sided buttock and lateral leg pain. She  was last seen on 07-03-2019 at which time, we did trigger point injections.  She reports near complete relief for 2 wks. In the last 10 days, the pain has  started to return in the lateral leg, as well as in her back. She has a  history of spinal stenosis. Her pain today is graded 8/10. She has continued  physical therapy, also doing E-stim and dry needling for 3 sessions. She  gets  about 2-2.5 days of relief with each session. She denies any weakness,  numbness or tingling into the right leg. She denies any new bowel or bladder  dysfunction.      **Vital Signs**    ---    Pain scale 8, Ht-in 62, Wt-lbs 154, BMI 28.16, BSA 1.75, Ht-cm 157.48, Wt-kg  69.85, Wt Change -2.8 lb.      **Physical Examination**    ---    General: NAD    Psych: Mood and affect appropriate    HEENT: NC/AT    CV: warm and well perfused    Pulm: breathing unlabored, normal chest expansions    Skin: No rash, swelling, ecchymoses, or erythema    Gait: not antalgic; able to heel, toe and tandem walk    (+) TTP over right gluteus maximus, right piriformis, right TFL, GTB; left  gluteus maximus    5/5 motor strength B/L    Sensation intact to light touch in all dermatomes    Reflex 1+ B/L patellar and Achilles.      **Assessments**    ---    1\. Trochanteric bursitis, right hip - M70.61 (Primary)    ---    2\. Iliotibial band syndrome of right side - M76.31    ---    3\. 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 Myofascial pain - M79.18    ---     [name] is a 58 year old female with recent left proximal tear fixed  surgically, who presents with persistent right buttock and lateral leg pain.  Her history and physical examination are most consistent with myofascial pain  coming from gluteus maximus, piriformis and tight ITB/TFL which improved  significantly with trigger point injections at the last visit. Pt educated  regarding diagnoses and treatment options, and tiers of treatment discussed.  Trigger point injections were performed today, see procedure note below.  Patient will track her symptoms in physical therapy, continue Graston  technique and dry needling. She will f/u in 4 wks to see if injections helped  at which time, we can consider repeating if she got some relief. All questions  answered.    A total of 30 minutes was spent preparing for and during this encounter  including: review of  imaging, performing medically appropriate examination,  counseling/education regarding treatment plans/injections and clinical  documentation into the electronic health record.    ---      **Procedures**    ---    Myofascial trigger point injections    Procedure performed by: Patrick North, DO    Injectate: 10ml total volume containing 4ml bupivacaine 0.25% with 6ml  lidocaine 1% without epinephrine    After risks, benefits and alternative treatment options and prognosis were  discussed, the patient signed informed consent. The patient's upper back was  examined for trigger points, with 6 total points found in the following  muscles: right gluteus maximus, piriformis, TFL, GTB; left gluteus maximus.  These trigger points were cleaned and prepped using alcohol. A 25G 1.5" needle  was then inserted and after negative aspiration, the above injectate was  delivered with subsequent dry needling at each identified. The patient  tolerated the procedure well and there were no complications, she was given  post-procedure instructions before discharge.      **Procedure Codes**    ---      16109 Trigger Point Inject >= 3 musc, Modifiers: 50    ---     **Follow Up**    ---    4 Weeks    Electronically signed by [name] on 08/04/2019 at 11:10 AM EDT    Sign off status: Completed        * * *        [facility]    [address]    [address]    [place], [place] [address]    Tel: [phone]    Fax: [phone]              * * *          Progress Note: Zacory Fiola [facility] 08/04/2019    ---    Note generated by eClinicalWorks EMR/PM Software ([email or web address])

## 2019-08-05 ENCOUNTER — Ambulatory Visit: Admitting: Physical Medicine & Rehabilitation

## 2019-08-05 NOTE — Progress Notes (Signed)
* * *      [name], [name] **DOB:** 06/07/1960 (59 yo F) **Acc No.** [id number] **DOS:**  08/05/2019    ---       [name], [name]**    ------    52 Y old Female, DOB: 01-02-1961    [address], [place], [place] [address]    Home: [phone]    [name]        * * *    Telephone Encounter    ---    Answered by  Dondra Prader Date: 08/05/2019       Time: 10:00 AM    Message                     [name] called the office said she misplaced the print out of exercises you printed out for her yesterday. wanted to know if a copy can be mailed to her.         ------            Action Taken                     [facility]  08/05/2019 10:01:21 AM > sent to MD      [facility]  08/05/2019 11:56:35 AM > yes will reprint for mailing                    * * *                ---          * * *         [name], [name] 08/05/2019    ---    Note generated by eClinicalWorks EMR/PM Software ([email or web address])

## 2019-08-06 ENCOUNTER — Ambulatory Visit

## 2019-08-06 ENCOUNTER — Ambulatory Visit: Admitting: Internal Medicine

## 2019-08-06 ENCOUNTER — Ambulatory Visit: Admit: 2019-08-06

## 2019-08-06 LAB — HX BF-CHEM/URINE
HX ALBUMIN RANDOM URINE: 0.5 mg/dL
HX ALBUMIN/CREATININE RATIO, URINE: 13 mg/g (ref 0–30)
HX CREATININE, RANDOM URINE: 38.6 mg/dL
HX MICROALBUMIN CALC: 0.01 mg/mg

## 2019-08-06 LAB — HX DIABETES: HX ALBUMIN RANDOM URINE: 0.5 mg/dL

## 2019-08-06 MED ORDER — LIDOCAINE: 1 | 30 | 1 refills | 0 days | Status: AC

## 2019-08-11 ENCOUNTER — Ambulatory Visit

## 2019-08-11 ENCOUNTER — Ambulatory Visit: Admitting: Hepatology

## 2019-08-11 ENCOUNTER — Ambulatory Visit: Admit: 2019-08-11

## 2019-08-11 LAB — HX CHEM-BLOODGAS: HX WHOLE BLOOD CREATININE: 0.8 mg/dL (ref 0.4–1.3)

## 2019-08-11 LAB — HX CHEM-PANELS
HX GFR, AFRICAN AMERICAN, WHOLE BLOOD: >=90 mL/min/{1.73_m2}
HX GFR, NON- AFRICAN AMERICAN, WHOLE BLOOD: 82 mL/min/{1.73_m2}

## 2019-08-11 MED ORDER — BACLOFEN: 0.5 | 15 | 3 refills | 0 days | Status: AC

## 2019-08-11 NOTE — Progress Notes (Signed)
 [name] [name], [name] [name] **DOB:** [birth year] (59 yo F) **Acc No.** [id number] **DOS:**  08/11/2019    ---       [name] [name], [name] [name]**    ------    89 Y old Female, DOB: [birth year], External MRN: [id number]    Account Number: [id number]    [address], [place], [id number]    Home: [phone]    Guarantor: [name] [name] Insurance: [id number] PPO    PCP: Ruffin Frederick Referring: Ruffin Frederick    [facility] Facility[facility]        * * *    08/11/2019 Progress Notes: Corwin Levins, MD **CHN#:** [id number]    ------    ---       **Reason for Appointment**    ---      1\. ABNORMAL LFTS    ---      **History of Present Illness**    ---     _Fibroscan_ :    Fibrosis Rationale:    The Food and Drug Administration (FDA) has approved market clearance for  Fibroscan, a novel medical device that measures liver stiffness. Vibration  controlled elastrography with Fibroscan use a modified ultrasound probe to  measure the velocity of a shear wave created by vibratory source. Establishing  the presence of fibrosis or cirrhosis in patients with chronic liver disease  is important for assessment of prognosis and for evidence of progressive  disease (fibrosis). The diagnosis of cirrhosis is important to initiate  screening for varices and liver cancer. A Fibroscan can be used as a  noninvasive alternative to a liver biopsy. Liver biopsies carry a small risk  for patients, may be associated with procedure discomfort, and interpretation  can be affected by sampling error and interpreter variability.    Fibroscan Procedure:    Patient was placed in the supine position with right hand elevated. Percussion  of liver to find optimal position was performed. Using a standard probe, a 50  Hz ultrasound wave was introduced into liver with measurements of shear  velocity. 2D image and elastrogram were evaluated for good transmission and  positioning.      **Current Medications**    ---    Taking    * Acetaminophen 500 MG Tablet 2  tablets as needed Orally every 8 hrs, Notes: as needed    ---    * Baclofen 10 MG Tablet 1/2 tab Orally prn, Notes: prn    ---    * Calcium Carbonate-Vitamin D 600-200 MG-UNIT Capsule 1 capsule with a meal Orally Twice a day    ---    * Famotidine 40 MG Tablet 1 tablet at bedtime Orally Once a day, Notes: prn    ---    * Gammagard - Solution as directed Injection once every 4 weeks, Notes: every 4 weeks    ---    * GlipiZIDE XL 5 MG Tablet Extended Release 24 Hour 1 tablet Orally Once a day    ---    * Glucophage XR 500 mg Tablet Extended Release 24 Hour 4 tablets Orally Once a day    ---    * Hydroxychloroquine Sulfate 200 MG Tablet 1 tablet with food or milk Orally Once a day    ---    * Jardiance 10 MG Tablet 1 tablet Orally Once a day    ---    * Lidocaine 5 % Patch 1 patch remove after 12 hours Externally Once a day, Notes: prn    ---    *  Lisinopril 2.5 MG Tablet 1 tablet Orally Once a day    ---    * Mycophenolate Mofetil 500 MG Tablet 2 tablet Orally twice daily    ---    * Omeprazole 40 mg Capsule Delayed Release 1 capsule Orally Once a day    ---    * Oxycodone HCl 5 MG Tablet 1 tablet as needed Orally every 4-6 hrs    ---    * PredniSONE 10 mg Tablet 1 tablet Orally Once a day    ---    * Rituxan 500 MG/50ML Solution as directed Intravenous , Notes: Last dose 05/19/19    ---    * Tramadol HCl 50 MG Tablet 1 tablet Orally every 6 hrs, Notes: PRN - rarely takes    ---    * Vitamin B Complex - Tablet 1 tablet Orally once daily    ---    * Vitamin D 25 MCG (1000 UT) Tablet 1 tablet Orally Once a day    ---      **Examination**    ---     _GENERAL:_    Fibrosis Staging Reference:    Fibrosis Staging    -measurement less than 5.48 kPa - F0    -measurement greater than or equal to 5.48 kPa and less than 8.29 kPa F1    -measurement greater than or equal to 8.29 kPa and less than 9.40 kPa F2    -measurement greater than or equal to 9.40 kPa and less than 11.9 kPa F3    -measurement greater than or equal to 11.9  kPa F4    Cap Score/ Steatosis Grade    -238 to 260 dB/m................S1    -260 to 290 dB/m................S2    -Higher than 290 dB/m.........S3    Amount of Liver with Fatty Change:    S1 -11 to 33%    S2 -34 to 66%    S3 -67% or more.          **Assessments**    ---    1\. Fatty (change of) liver, not elsewhere classified - K76.0 (Primary)    ---      **Procedures**    ---     _Fibroscan GI_ :    Any chance of pregnancy? No.    Do you have any implanted devices? No.    Has patient been fasting for at least 4 hours? Yes.    Results    ALT: 56BMI: 28.68Probe Used: MScore:IQR/med (%):Number of Attempts:Number of  Measurements Obtained:Performed by: VRP.    Interpretation    **I have personally reviewed and interpreted the findings of the elastography  (fibroscan). There is no evidence of fatty liver based on elastography Grade  S0, score 225 dB/m. There is evidence of moderate scarring or fibrosis of the  liver, stage F2 fibrosis score 9.3 kPa. Please follow-up with [facility] as previously scheduled. **    **Impression:Steatosis Grade: S0; Fibrosis Stage: F2** ****    .          **Procedure Codes**    ---      [phone] LIVER ELASTOGRAPHY    ---    Electronically signed by Alinda Money , MD on 08/11/2019 at 12:24 PM EDT    Sign off status: Completed        * * *        [facility]    [address] [place], [address]    [place], [place] [address]  Tel: [phone]    Fax: [phone]              * * *          Progress Note: Corwin Levins, MD 08/11/2019    ---    Note generated by e[employer]Works EMR/PM Software ([email or web address])

## 2019-08-11 NOTE — Progress Notes (Signed)
 .  Progress Notes  .  Patient: [name] [name], [name] [name]  Provider: Corwin Levins    .  DOB: 1961/02/11 Age: 59 Y Sex: Female  .  PCP: Ruffin Frederick    Date: 08/11/2019  .  --------------------------------------------------------------------------------  .  REASON FOR APPOINTMENT  .  1. ABNORMAL LFTS  .  HISTORY OF PRESENT ILLNESS  .  Fibroscan:   Fibrosis Rationale:The Food and Drug Administration (FDA) has  approved market clearance for Fibroscan, a novel medical device  that measures liver stiffness. Vibration controlled elastrography  with Fibroscan use a modified ultrasound probe to measure the  velocity of a shear wave created by vibratory source.  Establishing the presence of fibrosis or cirrhosis in patients  with chronic liver disease is important for assessment of  prognosis and for evidence of progressive disease (fibrosis). The  diagnosis of cirrhosis is important to initiate screening for  varices and liver cancer. A Fibroscan can be used as a  noninvasive alternative to a liver biopsy. Liver biopsies carry a  small risk for patients, may be associated with procedure  discomfort, and interpretation can be affected by sampling error  and interpreter variability.Fibroscan Procedure:Patient was  placed in the supine position with right hand elevated.  Percussion of liver to find optimal position was performed. Using  a standard probe, a 50 Hz ultrasound wave was introduced into  liver with measurements of shear velocity. 2D image and  elastrogram were evaluated for good transmission and positioning.  .  CURRENT MEDICATIONS  .  Taking Acetaminophen 500 MG Tablet 2 tablets as needed Orally  every 8 hrs, Notes: as needed  Taking Baclofen 10 MG Tablet 1/2 tab Orally prn, Notes: prn  Taking Calcium Carbonate-Vitamin D 600-200 MG-UNIT Capsule 1  capsule with a meal Orally Twice a day  Taking Famotidine 40 MG Tablet 1 tablet at bedtime Orally Once a  day, Notes: prn  Taking Gammagard - Solution as directed Injection once  every 4  weeks, Notes: every 4 weeks  Taking GlipiZIDE XL 5 MG Tablet Extended Release 24 Hour 1 tablet  Orally Once a day  Taking Glucophage XR 500 mg Tablet Extended Release 24 Hour 4  tablets Orally Once a day  Taking Hydroxychloroquine Sulfate 200 MG Tablet 1 tablet with  food or milk Orally Once a day  Taking Jardiance 10 MG Tablet 1 tablet Orally Once a day  Taking Lidocaine 5 % Patch 1 patch remove after 12 hours  Externally Once a day, Notes: prn  Taking Lisinopril 2.5 MG Tablet 1 tablet Orally Once a day  Taking Mycophenolate Mofetil 500 MG Tablet 2 tablet Orally twice  daily  Taking Omeprazole 40 mg Capsule Delayed Release 1 capsule Orally  Once a day  Taking Oxycodone HCl 5 MG Tablet 1 tablet as needed Orally every  4-6 hrs  Taking PredniSONE 10 mg Tablet 1 tablet Orally Once a day  Taking Rituxan 500 MG/50ML Solution as directed Intravenous ,  Notes: Last dose 05/19/19  Taking Tramadol HCl 50 MG Tablet 1 tablet Orally every 6 hrs,  Notes: PRN - rarely takes  Taking Vitamin B Complex - Tablet 1 tablet Orally once daily  Taking Vitamin D 25 MCG (1000 UT) Tablet 1 tablet Orally Once a  day  .  ALLERGIES  .  yes[Allergies Verified]  .  EXAMINATION  .  GENERAL: Fibrosis Staging Reference:Fibrosis  Staging-measurement less than 5.48 kPa - F0-measurement greater  than or equal to 5.48 kPa and less than 8.29  kPa F1-measurement  greater than or equal to 8.29 kPa and less than 9.40 kPa  F2-measurement greater than or equal to 9.40 kPa and less than  11.9 kPa F3-measurement greater than or equal to 11.9 kPa F4Cap  Score/ Steatosis Grade-238 to 260 dB/m...............Marland KitchenS1-260 to  290 dB/m...............Marland KitchenS2-Higher than 290 dB/m.........S3Amount  of Liver with Fatty Change:S1 -11 to 33%S2 -34 to 66%S3 -67% or  more.  .  ASSESSMENTS  .  Fatty (change of) liver, not elsewhere classified - K76.0  (Primary)  .  PROCEDURES  .  Fibroscan GI  Any chance of pregnancy?    No  Do you have any implanted devices?    No  Has patient  been fasting for at least 4 hours?    Yes  Results    ALT: 56 BMI: 28.68 Probe Used: M Score: IQR/med (%):  Number of Attempts: Number of Measurements Obtained: Performed  by: VRP  Interpretation    I have personally reviewed and interpreted the  findings of the elastography (fibroscan). There is no evidence of  fatty liver based on elastography Grade S0, score 225 dB/m. There  is evidence of moderate scarring or fibrosis of the liver, stage  F2 fibrosis score 9.3 kPa. Please follow-up with [facility] as previously scheduled. Impression: Steatosis Grade: S0;  Fibrosis Stage: F2  .  PROCEDURE CODES  .  91200 LIVER ELASTOGRAPHY  .  Electronically signed by Alinda Money , MD on  08/11/2019 at 12:24 PM EDT  .  Document electronically signed by Trevor Iha, RAFFI   .

## 2019-08-11 NOTE — Progress Notes (Signed)
 General Medicine Visit - Resident note with preceptor addendum  .  DIAB EYE EX, MAMMOGRAM.    .  * [place]: Leon (Christiane)  .  Patient Details and Vitals  Patient reviewed in/by:  Office  Patient Best Phone # [phone]  Patient Email Address [name]  .  BMI: 28.47  .  BP: 127/ 68  Ht (inches): 62.5  Weight: 157.6  BMI: 28.47  Temp: 98.7  Pulse: 45  .  Med List: PRINTED by Hana for patient   hd_medl: printed  .  [name]  Patient Medical History   Travel outside of the [place] in past 28 days:: No  .  In the past year, have you ... Had no falls  Difficulty with balance? NO  Use a Cane NO  Use a Walker NO  Need assistance with ambulation while here? NO  .  [name]  Tobacco use? never smoker  Does patient experience chronic pain ? YES  Severity of pain? (min=0, max=10) 6  .  Patient Screening   .  SDOH: Housing situation: I have housing  SDOH: Run out or ability to purchase food: Never true  SDOH: Utility shutoff: No  SDOH: Miss appt lack transportation: No  SDOH: unemployed or looking for work: No  SDOH: Socially withdrawn: Never  .  .  .  PHQ2 (Q1) Little Interest or pleasure in doing things: 0  PHQ2 (Q2) Feeling down, depressed or hopeless: 0  PHQ2 Score: 0  .  Abuse/Neglect (Q1) Feel unsafe in relationship: NO  Abuse/Neglect (Q2) Hurt in past year: NO  .  .  .  .  .  Data to be shared to Telemed/Office Visits   Aug 06, 2019 2:12 PM  Screening [place]: [facility])  Chronic Pain, level = 6  PHQ2 Score: 0  .  ---------- ---------- ----------   .  ......................................Marland KitchenDomenica Reamer  Aug 06, 2019 2:12 PM  .  .  .  **Resident Note**  .  .  Patient Prep Updates   [place] Pre-Visit Notes:  Aug 06, 2019 2:12 PM  Screening [place]: [facility])  Chronic Pain, level = 6  PHQ2 Score: 0  .  ---------- ---------- ----------   .  .  .  .  .  .  .  .  * Preceptor: Ahearn (Dineli)  CC: f/u GIB  .  HPI: Fathima Bartl is a 59 year old female with history of scleroderma, I  gG4 deficiency, diabetes, HTN, HLD and GIB who  presents for follow up visit   for GI bleed. Patient reports new L wrist pain that began this morning.   [name]  Swollen L wrist  - awoke this morning and had pain in wrist, pain and swelling improving on   it's own  - no numbness or tingling  - right now 4/10 pain described as tightness  - slightly decreased ROM from swelling  - no F/C  - previously had ganglion cyst in R wrist and had it drained w/ ortho, then   removed when had carpal tunnel surgery  - no trauma, no lifting or twisting motion  .  GIB  - had f/u w/ GI on 4/1, considering capsule study pending EGD records from   OSH  --- at this appointment was referred to hepatology for transaminitis, under  going work up w/ CT and fibro scan  - recent hgb 13.7 on 4/22  - no dark stools since end of April  - mild  diffuse abdominal pain after eating  .  [name]  Past Medical History:(reviewed)  HEALTH CARE MAINTENANCE  COVID vaccinated  STD- low risk, deferred  Pap smear - 01/17/2019- nml neg HPV  Mammogram - normal 03/2017. normal 02/01/2018. schedule 09/2019  Colonoscopy -  done 2016, repeat 2017 and both times poor prep but overall   has been sufficient for screening purposes. In combination, these two colon  oscopies have likely given an adequate colon cancer screening. Recommend re  peat colonoscopy in 3 years.  1 polyp removed, next colonoscopy in 3 yrs (2  020). Colonscopy 05/2019 (at [facility] when had GIB) no polyp  s removed)   DEXA (>33yr) - 2017 normal bone mass; on chronic prednisone use. 02/01/2018   nml bone mass, next due 2022  Lung cancer screening (55-80)- N/A  Influenza - annually  HPV - N/A  Hep C (9562-1308) - non reactive 2017  Shingles shot - recommended she get Shingrix when/where it's available  Pneumonia - PPSV23 01/26/2017. Due for booster at age 65.  Tetanus and TDAP 11/04/2014  Contraception - post menopausal  LDL - 42 (06/2019)  HGBA1C - 6.5 (06/2019)  .  Family History: (reviewed)   Father: DMII, died of MI at 54  Mom: died of lung CA at  13  MGM and PGM: lung ca (smokers)  MGF and several other second degree relatives: brain aneurysms in their 53'  s.  2 maternal uncles:melanoma (one died of unknown cancer, one living)  brother recently diagnosed with systemic scleroderma c/b gangrene of fingers  .  Social History: (reviewed)   Non-smoker, rare etoh, no IVDU. 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 Separated from life partner of 20 years jan 20  20 and now planning [place] move [place]. No children. Always wears a seat belt.  .  .  .  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) (ICD10-M47.812)  NECK PAIN (ICD-723.1) (ICD10-M54.2)  STEROID USE, LONG TERM (ICD-V58.65) (ICD10-Z79.52)  PREMATURE VENTRICULAR CONTRACTIONS (ICD-427.69) (ICD10-I49.3)  HYPERLIPIDEMIA (ICD-272.4) (ICD10-E78.5)  BACK PAIN, LUMBAR, CHRONIC (ICD-724.2) (ICD10-M54.5)  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)  DIABETES MELLITUS, TYPE II (ICD-250.00) (ICD10-E11.9)  LONG-TERM (CURRENT) USE OF STEROIDS (ICD-V58.65) (ICD10-Z79.51)  SKIN SAGGING DUE TO WEIGHT LOSS (ICD-757.9) (ICD10-Q84.9)  TRANSAMINASES, SERUM, ELEVATED (ICD-790.4) (ICD10-R74.0)  OBESITY, BMI 30-34.9, ADULT (ICD-278.00) (ICD10-E66.9)      DIABETES MELLITUS, TYPE II, UNCONTROLLED (ICD-250.02) (ICD10-E11.65)      FATTY LIVER DISEASE (ICD-571.8) (ICD10-K76.0)  SCLERODERMA, 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)  HYPOTHYROIDISM (ICD-244.9) (ICD10-E03.9)  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) (ICD10-D  80.8)  SPINAL STENOSIS, 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)  (ICD10-M48.06)  HEPATITIS C EXPOSURE (HCV RNA NEGATIVE, 01/2014) (ICD-V02.62) (ICD10-Z20.5)  PAIN IN JOINT, HAND (ICD-719.44) (ICD10-M79.643)  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)  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)  CARPAL TUNNEL (ICD-354.0) (ICD10-G56.00)  OSTEOARTHRITIS (ICD-715.09) (ICD10-M15.9)  S/P ROTATOR CUFF SURGERY 12/2004 - RT SHOULDER; 2008 LT (ICD-V45.89)  Family Hx of MELANOMA, FAMILY HX (ICD-V16.8) (ICD10-Z80.8)  FRACTURE OF OTHER SPEC SITE,  PATHOLOGIC - MULTIPLE (ICD-733.19)  CHOLECYSTECTOMY AND HERNIA REPAIR (ICD-V45.89)  VITAMIN D DEFICIENCY (ICD-268.9) (ICD10-E55.9)  COLONOSCOPY, NEXT  2020- SEE COMMENT (ICD-V76.51) (ICD10-Z01.89)  PROBLEM CHANGES:  Added new problem of MUSCLE RUPTURE, NONTRAUMATIC (ICD-728.83) (ICD10-M62.1  0) - Signed  Added new problem of EROSIVE GASTRITIS (ICD-535.40) (ZOX09-U04.54) - Signed  Added new problem of DIVERTICULOSIS, COLON (ICD-562.10) (UJW11-B14.78) - Si  gned  Added new problem of INTERNAL HEMORRHOID (ICD-455.0) (GNF62-Z30.8) - Signed  Added new problem of GANGLION (ICD-727.43) (ICD10-M67.40)  Problems Reviewed:  Done  .  [name]  MEDICATIONS:   PAST MEDICINES (prior to today's visit):  HYDROXYCHLOROQUINE SULFATE 200 MG ORAL TABLET (HYDROXYCHLOROQUINE SULFATE)   one tablet oral daily; Route: ORAL  JARDIANCE 10 MG ORAL TABLET (EMPAGLIFLOZIN) Take 1 tablet by mouth once dai  ly; Route: ORAL  ROSUVASTATIN 10MG  TABLETS (ROSUVASTATIN CALCIUM) TAKE 1 TABLET BY MOUTH EVE  RY DAY  LISINOPRIL 2.5MG  TABLETS (LISINOPRIL) TAKE 1 TABLET BY MOUTH ONCE DAILY  METFORMIN HCL ER 500 MG XR24H-TAB (METFORMIN HCL) TAKE 4 TABLETS BY MOUTH E  VERY MORNING  GLIPIZIDE ER 2.5 MG XR24H-TAB (GLIPIZIDE) TAKE 1 TABLET BY MOUTH DAILY  OMEPRAZOLE 20 MG ORAL CAPSULE DELAYED RELEASE (OMEPRAZOLE) Take one capsule   by mouth twice a day; Route: ORAL  BACLOFEN 10MG  TABLETS (BACLOFEN) TAKE 1/2 TABLET BY MOUTH EVERY DAY AS NEED  ED FOR BACK OR SPASMS  DICLOFENAC SODIUM 1 %  TRANSDERMAL GEL (DICLOFENAC SODIUM) APP 2 GRAMS EXT A  A BID      FREESTYLE FREEDOM LITE w/Device KIT (BLOOD GLUCOSE MONITORING SUPPL) [place]  e as directed (ICD 10 E11.65)      FREESTYLE LITE BLOOD GLUCOSE STRIPS (GLUCOSE BLOOD) USE TO CHECK BLOOD   GLUCOSE THREE TIMES DAILY      FREESTYLE LANCETS (LANCETS) check fingerstick three times a day (ICD 10   E11.65)  TRAMADOL HCL 50 MG ORAL TABLET (TRAMADOL HCL) take one tablet daily as need  ed for pain; Route: ORAL  OXYCODONE HCL 5 MG ORAL TABLET (OXYCODONE HCL) Take one tablet daily as nee  ded for pain >8. Partial fill upon patient request.; Route: ORAL  CALCIUM CARBONATE-VITAMIN D 600-200 MG-UNIT ORAL TABLET (CALCIUM CARBONATE-  VITAMIN D) take one tablet twice a day; Route: ORAL  GABAPENTIN 300MG  CAPSULES (GABAPENTIN) TAKE 1 CAPSULE BY MOUTH EVERY NIGHT   AT BEDTIME AS NEEDED FOR PAIN  * BLOODWORK Please draw chemistry (sodium, potassium, magnesium, chloride,   bicarbonate, glucose, creatinine, BUN), CBC, TSH, lipids, HbA1c. Fax result   to [phone] Dr. Marlane Hatcher  PREDNISONE 10 MG ORAL TABLET (PREDNISONE) Take 3 tablets once a day; Route:   ORAL  FAMOTIDINE 20 MG TABLET (FAMOTIDINE) TAKE 1 TABLET BY MOUTH AT NIGHT AS NEE  DED  IRON 325 (65 FE) MG ORAL TABLET (FERROUS SULFATE) Take one tablet once a da  y; Route: ORAL  .  MEDICINE CHANGES:  Removed medication of IRON 325 (65 FE) MG ORAL TABLET (FERROUS SULFATE) Tak  e one tablet once a day; Route: ORAL - Signed  Added new medication of LIDOCARE BACK/SHOULDER 4 % EXTERNAL PATCH (LIDOCAIN  E) Apply one patch to back as needed for pain; Route: EXTERNAL - Signed  Added new medication of MYCOPHENOLATE MOFETIL 500 MG ORAL TABLET (MYCOPHENO  LATE MOFETIL) Take 2 tablets twice daily; Route: ORAL - Signed  Rx of LIDOCARE BACK/SHOULDER 4 % EXTERNAL PATCH (LIDOCAINE) Apply one patch   to back as needed for pain; Route: EXTERNAL  #30[Patch] x 1;  Signed;  Ent  ered by: [name] -JN 6A-;  Authorized by: Ellin Goodie  MD -  Paula Libra  6A-;  Method used: Electronically to [id number] [address], [place]  [place], [place]  [address], Ph: [phone], Fax: [phone]; Note to Pharmacy: Route:   EXTERNAL;  Medications Reviewed:  Done  .  [name]  ALLERGIES:   No Known Allergies  Allergies Reviewed:  Done  .  [name]  DIRECTIVES:   .  .  .  Vitals:   BP: 127 / 68 mmHg Ht: 62.5 in.  Wt: 157.6 lbs.  BMI (in-lb) 28.47  Temp: 98.7deg F.   Pulse Rate: 45 bpm  .  .  Additional PE: Gen: well appearing female, NAD  CV: RRR, no murmurs  Pulm: normal work of breathing, CTAB  Abd: pannus with healed skin erosions, nondistended, soft, nontender  Ext: wwp, no edema, L wrist w/ 1 cm mobille tender nodule anterior lateral   wrist, L wrist ROM and strength intact, sensation L wrist intact  .  [name]  Assessment /T/ Plan:   59 year old female with history of scleroderma, IgG4 deficiency, diabetes,   HTN, HLD and GIB who presents for follow up visit for GI bleed, found to ha  ve L ganglion cyst  .  1. L wrist ganglion cyst  Patient developed new wrist pain w/ nodule overnight, exam consistent w/ ga  nglion cyst. Pt has h/o of prior ganglion cysts in R wrist that was surgica  lly removed. Pain is improving on own, no invervention needed at this time.   - no intervention  .  2. Excess skin  Patient has panus with areas of healed erosions and skin breakdown. Was ref  erred to plastic surgery for pannectomy given her increased risk of infecti  on with her history of IgG deficiency and diabetes.  - plastic surgery referral for pannectomy to reduce risk of intertrigo give  n diabetes and IgG4 defiiciency  .  3. GIB  Pt recently hospitalized at  [facility] for GI bleed in feb 2  021, EGD /T/ colonscopy done /T/ found nonbleeding erosive gastropathy, div  erticulosis in sigmoid colon, descending and transverve colon, internal hem  orrhoids. Seen by GI on 4/1 and determining if capsule study needed. No rec  ent melena and hgb 4/22 at baseline of 13.   - cont care w/ GI  - hgb at  baseline 2 weeks ago /T/ no melena so do not need to recheck CBC t  oday  .  3. DM  - Recent A1c 6/5% 06/2019.   - send microalbumin  - ophtho f/u  .  4. Health Maintenance  - received both shots of covid vaccine  - mammography scheduled 09/2019  - f/u 3 months given complex medical history  .  .  [name]  * Resident Signature (.sign): ......................................Marland KitchenEllin Goodie MD -JN 6A-  Aug 06, 2019 3:11 PM  .  .  .  ORDERS:  Microalbumin urine [MICROALB/CRE] [CPT-82043]  RTC in 3 months [RTC-090]  Est Level 3 (MDM or 20-29 min) [[id number]]  .  [name]  **Preceptor Note**  Resident: Ellin Goodie)  Problem Follow-up  .  [name]  Subjective   Patient is a 59 Years Old Female who presents to clinic GIB, seen in GI for   FU debating on whether to do capsule study. elevated transaminitis, worked   up for autoimm hep. Also this AM, left wrist pain. Swollen and 1cm nodule   under skin. Pain and swelling has improved. Skin flaps, skin irritations un  der flaps.  I discussed the patient with Dr. Tomasa Rand Chanetta Marshall) in a routine precepting   encounter.  I also personally interviewed and examined the patient.  I agr  ee with the patient's diagnosis and management.   .  .  Objective   see resident's note for additional details.    .  Assessment   GIB  wrist discomfort  excess skin panniculi  .  Plan   See resident note for details  .  Seen On Team (Floor):  6A  .  .  .  Patient Care Plan  .  .  .  Immunization Worksheet 2019 (rev 07/09/2019)   .  .  .  .  .  .  .  .  .  .  .  .  .  Follow-up With:   .  ]  .  [name]  Electronically Signed by Ballard Russell, MD on 08/10/2019 at 4:56 PM  ________________________________________________________________________

## 2019-08-13 ENCOUNTER — Ambulatory Visit

## 2019-08-13 NOTE — Telephone Encounter (Signed)
 Call Details:   Patient PCP = [name] -JN 6A-  [name] (Patient) called on Aug 13, 2019 1:35 PM.  Message taken by: Bartholomew Crews  Primary call-back number: [phone]    Secondary call-back number: [phone] -    Call Reason(s): Prescription        ** REQUESTING A PRESCRIPTION OR REFILL.  [facility] Pharm[facility]: [id number]  [address] [facility] [place]  Phone: [phone]  Fax: [phone]    Provide Prescription to Pharmacy  Previously filled by [facility]  New Med: Lidocaine 5% patch    ---------- ---------- ---------- ---------- ---------- ----------       RESPONSE/ORDERS:    called pharmacy, refill ready. 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-Q21.1)  SCREENING EXAM FOR BREAST CANCER (ICD-V76.10) (ICD10-Z12.39)  ANEMIA, OTHER (ICD-285.8) (ICD10-D64.89)  LOWER EXTREMITY EDEMA (ICD-782.3) (ICD10-R60.0)  IRREGULAR HEART BEATS (ICD-427.9) (ICD10-I49.9)  GI BLEED (ICD-578.9) (BOE78-S12.2)  SCLERODERMA (ICD-710.1) (ICD10-M34.9)  SCREENING FOR CERVICAL CANCER (ICD-V76.2) (KSK81-N88.4)  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) (ICD10-M47.812)  NECK PAIN (ICD-723.1) (ICD10-M54.2)  STEROID USE, LONG TERM (ICD-V58.65) (ICD10-Z79.52)  PREMATURE VENTRICULAR CONTRACTIONS (ICD-427.69) (ICD10-I49.3)  HYPERLIPIDEMIA (ICD-272.4) (ICD10-E78.5)  BACK PAIN, LUMBAR, CHRONIC (ICD-724.2) (ICD10-M54.5)  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)  DIABETES MELLITUS, TYPE II (ICD-250.00) (ICD10-E11.9)  LONG-TERM (CURRENT) USE OF STEROIDS (ICD-V58.65) (ICD10-Z79.51)  SKIN SAGGING DUE TO WEIGHT LOSS (ICD-757.9) (ICD10-Q84.9)  TRANSAMINASES, SERUM, ELEVATED (ICD-790.4)  (ICD10-R74.0)  OBESITY, BMI 30-34.9, ADULT (ICD-278.00) (ICD10-E66.9)      DIABETES MELLITUS, TYPE II, UNCONTROLLED (ICD-250.02) (ICD10-E11.65)      FATTY LIVER DISEASE (ICD-571.8) (ICD10-K76.0)  SCLERODERMA, 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)  HYPOTHYROIDISM (ICD-244.9) (ICD10-E03.9)  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) (ICD10-D80.8)  SPINAL STENOSIS, 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)  HEPATITIS C EXPOSURE (HCV RNA NEGATIVE, 01/2014) (ICD-V02.62) (ICD10-Z20.5)  PAIN IN JOINT, HAND (ICD-719.44) (ICD10-M79.643)  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)  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)  CARPAL TUNNEL (ICD-354.0) (ICD10-G56.00)  OSTEOARTHRITIS (ICD-715.09) (ICD10-M15.9)  S/P ROTATOR CUFF SURGERY 12/2004 - RT SHOULDER; 2008 LT (ICD-V45.89)  Family Hx of MELANOMA, FAMILY HX (ICD-V16.8) (ICD10-Z80.8)  FRACTURE OF OTHER SPEC SITE,  PATHOLOGIC - MULTIPLE (ICD-733.19)  CHOLECYSTECTOMY AND HERNIA REPAIR (ICD-V45.89)  VITAMIN D DEFICIENCY (ICD-268.9) (ICD10-E55.9)  COLONOSCOPY, NEXT 2020- SEE COMMENT (ICD-V76.51) (ICD10-Z01.89)    Meds (prior to this call):   HYDROXYCHLOROQUINE SULFATE 200 MG ORAL TABLET (HYDROXYCHLOROQUINE SULFATE) one tablet oral daily; Route: ORAL  JARDIANCE 10 MG ORAL TABLET (EMPAGLIFLOZIN) Take 1 tablet by mouth once daily; Route: ORAL  ROSUVASTATIN 10MG  TABLETS (ROSUVASTATIN CALCIUM) TAKE 1 TABLET BY MOUTH EVERY DAY  LISINOPRIL 2.5MG  TABLETS (LISINOPRIL) TAKE 1 TABLET BY MOUTH ONCE DAILY  METFORMIN HCL ER 500 MG XR24H-TAB (METFORMIN HCL) TAKE 4 TABLETS BY MOUTH EVERY MORNING  GLIPIZIDE ER 2.5 MG XR24H-TAB (GLIPIZIDE) TAKE 1 TABLET BY MOUTH DAILY  OMEPRAZOLE 20 MG ORAL CAPSULE DELAYED RELEASE (OMEPRAZOLE) Take one capsule by mouth twice a day; Route: ORAL  BACLOFEN 10MG  TABLETS (BACLOFEN) TAKE 1/2 TABLET BY MOUTH EVERY DAY AS NEEDED FOR BACK OR SPASMS  DICLOFENAC SODIUM 1 % TRANSDERMAL GEL (DICLOFENAC SODIUM) APP 2 GRAMS EXT AA BID      FREESTYLE FREEDOM LITE  w/Device KIT (BLOOD GLUCOSE MONITORING  SUPPL) use as directed (ICD 10 E11.65)      FREESTYLE LITE BLOOD GLUCOSE STRIPS (GLUCOSE BLOOD) USE TO CHECK BLOOD GLUCOSE THREE TIMES DAILY      FREESTYLE LANCETS (LANCETS) check fingerstick three times a day (ICD 10 E11.65)  TRAMADOL HCL 50 MG ORAL TABLET (TRAMADOL HCL) take one tablet daily as needed for pain; Route: ORAL  OXYCODONE HCL 5 MG ORAL TABLET (OXYCODONE HCL) Take one tablet daily as needed for pain >8. Partial fill upon patient request.; Route: ORAL  CALCIUM CARBONATE-VITAMIN D 600-200 MG-UNIT ORAL TABLET (CALCIUM CARBONATE-VITAMIN D) take one tablet twice a day; Route: ORAL  GABAPENTIN 300MG  CAPSULES (GABAPENTIN) TAKE 1 CAPSULE BY MOUTH EVERY NIGHT AT BEDTIME AS NEEDED FOR PAIN  * BLOODWORK Please draw chemistry (sodium, potassium, magnesium, chloride, bicarbonate, glucose, creatinine, BUN), CBC, TSH, lipids, HbA1c. Fax result to [phone] Dr. Marlane Hatcher  PREDNISONE 10 MG ORAL TABLET (PREDNISONE) Take 3 tablets once a day; Route: ORAL  FAMOTIDINE 20 MG TABLET (FAMOTIDINE) TAKE 1 TABLET BY MOUTH AT NIGHT AS NEEDED  LIDOCARE BACK/SHOULDER 4 % EXTERNAL PATCH (LIDOCAINE) Apply one patch to back as needed for pain; Route: EXTERNAL  MYCOPHENOLATE MOFETIL 500 MG ORAL TABLET (MYCOPHENOLATE MOFETIL) Take 2 tablets twice daily; Route: ORAL            Created By Bartholomew Crews on 08/13/2019 at 01:35 PM    Electronically Signed By Angelia Mould RN on 08/13/2019 at 01:48 PM

## 2019-08-15 ENCOUNTER — Ambulatory Visit

## 2019-08-18 ENCOUNTER — Ambulatory Visit: Admitting: Rheumatology

## 2019-08-18 ENCOUNTER — Ambulatory Visit

## 2019-08-18 MED ORDER — LIDOCAINE: 1 | 30 | 3 refills | 0 days | Status: AC

## 2019-08-18 NOTE — Telephone Encounter (Signed)
 Call Details:   Patient PCP = [name] -JN 6A-  [name] (Patient) called on Aug 18, 2019 9:12 AM.  Message taken by: Angela Adam  Primary call-back number: [phone]    Secondary call-back number: [phone] -    Call Reason(s): Prescription        ** REQUESTING A PRESCRIPTION OR REFILL.  Preferred Pharmacy: [facility] [phone]*  [address] [facility] [place]  Phone: [phone]  Fax: [phone]    Provide Prescription to Pharmacy  Medications to refill:  Lidocare back/shoulder 4 % external patch : Apply one patch to back as needed for pain  Previously filled by [facility]    ---------- ---------- ---------- ---------- ---------- ----------     Extra Medication Info:  Pt. stated that ONLY the 5% patches are covered.  Medications:  LIDODERM 5 % EXTERNAL PATCH (LIDOCAINE) Apply one patch to back once daily  as needed for pain, Remove after 12 hrs  #30[Patch] x 3   Route:EXTERNAL   Entered by: Kandis Fantasia, RN   Authorized by: Theodis Aguas MSN,NP   Signed by: Kandis Fantasia, RN on 08/18/2019   Method used: Electronically to      [facility] [phone]* (retail)     [address]     [place], [place]  [address]     Ph: [phone]     Fax: [phone]   Note to Pharmacy: Route: EXTERNAL;    RxID: [id number]      RESPONSE/ORDERS:    ? ok to change to 5% patch     ......................................Marland KitchenKandis Fantasia, RN  Aug 18, 2019 10:06 AM  yes, thank you!  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-Q21.1)  SCREENING EXAM FOR BREAST CANCER (ICD-V76.10) (ICD10-Z12.39)  ANEMIA, OTHER (ICD-285.8) (ICD10-D64.89)  LOWER EXTREMITY  EDEMA (ICD-782.3) (ICD10-R60.0)  IRREGULAR HEART BEATS (ICD-427.9) (ICD10-I49.9)  GI BLEED (ICD-578.9) (TJQ30-S92.2)  SCLERODERMA (ICD-710.1) (ICD10-M34.9)  SCREENING FOR CERVICAL CANCER (ICD-V76.2) (ZRA07-M22.4)  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) (ICD10-M47.812)  NECK PAIN (ICD-723.1) (ICD10-M54.2)  STEROID USE, LONG TERM (ICD-V58.65) (ICD10-Z79.52)  PREMATURE VENTRICULAR CONTRACTIONS (ICD-427.69) (ICD10-I49.3)  HYPERLIPIDEMIA (ICD-272.4) (ICD10-E78.5)  BACK PAIN, LUMBAR, CHRONIC (ICD-724.2) (ICD10-M54.5)  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)  DIABETES MELLITUS, TYPE II (ICD-250.00) (ICD10-E11.9)  LONG-TERM (CURRENT) USE OF STEROIDS (ICD-V58.65) (ICD10-Z79.51)  SKIN SAGGING DUE TO WEIGHT LOSS (ICD-757.9) (ICD10-Q84.9)  TRANSAMINASES, SERUM, ELEVATED (ICD-790.4) (ICD10-R74.0)  OBESITY, BMI 30-34.9, ADULT (ICD-278.00) (ICD10-E66.9)      DIABETES MELLITUS, TYPE II, UNCONTROLLED (ICD-250.02) (ICD10-E11.65)      FATTY LIVER DISEASE (ICD-571.8) (ICD10-K76.0)  SCLERODERMA, 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)  HYPOTHYROIDISM (ICD-244.9) (ICD10-E03.9)  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) (ICD10-D80.8)  SPINAL STENOSIS, 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)  HEPATITIS C EXPOSURE (HCV RNA NEGATIVE, 01/2014) (ICD-V02.62) (ICD10-Z20.5)  PAIN IN JOINT, HAND (ICD-719.44) (ICD10-M79.643)  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)  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)  CARPAL TUNNEL (ICD-354.0) (ICD10-G56.00)  OSTEOARTHRITIS (ICD-715.09) (ICD10-M15.9)  S/P ROTATOR CUFF SURGERY 12/2004 - RT SHOULDER; 2008 LT (ICD-V45.89)  Family Hx of MELANOMA, FAMILY HX (ICD-V16.8) (ICD10-Z80.8)  FRACTURE OF OTHER SPEC SITE,  PATHOLOGIC - MULTIPLE (ICD-733.19)  CHOLECYSTECTOMY AND HERNIA REPAIR (ICD-V45.89)  VITAMIN D DEFICIENCY (ICD-268.9) (ICD10-E55.9)  COLONOSCOPY,  NEXT 2020- SEE COMMENT (ICD-V76.51) (ICD10-Z01.89)    Meds (prior to this call):   HYDROXYCHLOROQUINE SULFATE 200 MG ORAL TABLET  (HYDROXYCHLOROQUINE SULFATE) one tablet oral daily; Route: ORAL  JARDIANCE 10 MG ORAL TABLET (EMPAGLIFLOZIN) Take 1 tablet by mouth once daily; Route: ORAL  ROSUVASTATIN 10MG  TABLETS (ROSUVASTATIN CALCIUM) TAKE 1 TABLET BY MOUTH EVERY DAY  LISINOPRIL 2.5MG  TABLETS (LISINOPRIL) TAKE 1 TABLET BY MOUTH ONCE DAILY  METFORMIN HCL ER 500 MG XR24H-TAB (METFORMIN HCL) TAKE 4 TABLETS BY MOUTH EVERY MORNING  GLIPIZIDE ER 2.5 MG XR24H-TAB (GLIPIZIDE) TAKE 1 TABLET BY MOUTH DAILY  OMEPRAZOLE 20 MG ORAL CAPSULE DELAYED RELEASE (OMEPRAZOLE) Take one capsule by mouth twice a day; Route: ORAL  BACLOFEN 10MG  TABLETS (BACLOFEN) TAKE 1/2 TABLET BY MOUTH EVERY DAY AS NEEDED FOR BACK OR SPASMS  DICLOFENAC SODIUM 1 % TRANSDERMAL GEL (DICLOFENAC SODIUM) APP 2 GRAMS EXT AA BID      FREESTYLE FREEDOM LITE w/Device KIT (BLOOD GLUCOSE MONITORING SUPPL) use as directed (ICD 10 E11.65)      FREESTYLE LITE BLOOD GLUCOSE STRIPS (GLUCOSE BLOOD) USE TO CHECK BLOOD GLUCOSE THREE TIMES DAILY      FREESTYLE LANCETS (LANCETS) check fingerstick three times a day (ICD 10 E11.65)  TRAMADOL HCL 50 MG ORAL TABLET (TRAMADOL HCL) take one tablet daily as needed for pain; Route: ORAL  OXYCODONE HCL 5 MG ORAL TABLET (OXYCODONE HCL) Take one tablet daily as needed for pain >8. Partial fill upon patient request.; Route: ORAL  CALCIUM CARBONATE-VITAMIN D 600-200 MG-UNIT ORAL TABLET (CALCIUM CARBONATE-VITAMIN D) take one tablet twice a day; Route: ORAL  GABAPENTIN 300MG  CAPSULES (GABAPENTIN) TAKE 1 CAPSULE BY MOUTH EVERY NIGHT AT BEDTIME AS NEEDED FOR PAIN  * BLOODWORK Please draw chemistry (sodium, potassium, magnesium, chloride, bicarbonate, glucose, creatinine, BUN), CBC, TSH, lipids, HbA1c. Fax result to [phone] Dr. Marlane Hatcher  PREDNISONE 10 MG ORAL TABLET (PREDNISONE) Take 3 tablets once a day; Route: ORAL  FAMOTIDINE 20 MG TABLET (FAMOTIDINE) TAKE 1 TABLET BY MOUTH AT NIGHT AS NEEDED  LIDOCARE BACK/SHOULDER 4 % EXTERNAL PATCH (LIDOCAINE) Apply one patch to  back as needed for pain; Route: EXTERNAL  MYCOPHENOLATE MOFETIL 500 MG ORAL TABLET (MYCOPHENOLATE MOFETIL) Take 2 tablets twice daily; Route: ORAL    Changes to Meds (this update):   Changed medication from [facility] BACK/SHOULDER 4 % EXTERNAL PATCH (LIDOCAINE) Apply one patch to back as needed for pain; Route: EXTERNAL to LIDODERM 5 % EXTERNAL PATCH (LIDOCAINE) Apply one patch to back once daily  as needed for pain, Remove after 12 hrs; Route: EXTERNAL - Signed  Rx of LIDODERM 5 % EXTERNAL PATCH (LIDOCAINE) Apply one patch to back once daily  as needed for pain, Remove after 12 hrs; Route: EXTERNAL  #30[Patch] x 3;  Signed;  Entered by: Kandis Fantasia, RN;  Authorized by: Theodis Aguas MSN,NP;  Method used: Electronically to [facility] [phone]*, [address] [place], [place]  [address], Ph: [phone], Fax: [phone]; Note to Pharmacy: Route: EXTERNAL;          Created By Angela Adam on 08/18/2019 at 09:12 AM    Electronically Signed By Karlyn Agee RN on 08/18/2019 at 10:37 AM

## 2019-08-18 NOTE — Progress Notes (Signed)
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  Hands are swollen and sore but not as extreme-->1AM, 1PM, three days a week she takes 2 at night.  20% improvment last time to 30% now. -->2AM and 2PM      Down to 10mg  every other day            PT and psyatrist doctor-lidocaine injections in hip and back-helps but not enough.  Massage on back and IT band.  She is still in so much pain.  Swimming, stretching, walking.       It is impacting her gait                Action Taken                     [facility]  08/18/2019 11:10:09 AM > Spoke with [name].  He would like me to keep in touch and have pt continue to update [place].  He recommended she increase her cellcept to 2AM and 2PM tabs.  She will do so.  She has f/u scheduled with her various specialists.  She will let me know how her appt on June 4th with the Hepatologist goes.  Additionally she has f/u scheduled with [place].                    * * *                ---          * * *         Provider: [name] 08/18/2019    ---    Note generated by eClinicalWorks EMR/PM Software ([email or web address])

## 2019-08-22 ENCOUNTER — Ambulatory Visit

## 2019-08-22 NOTE — Progress Notes (Signed)
 [facility] Aug 22, 2019  [address]   [place]  [place] [address]  Main: [phone]  Fax: [phone]  Patient Portal: [email or web address]              This form should be completed fully then faxed to [facility] ([phone])  To schedule exams, call [phone]    Appointment date & time: _________________________________________________      Name: [name] Gender Female DOB: 1960/10/26 [place] [facility] Medical Record #: [id number]  Interpreter Needed:     Yes / No  Language: English  Dialect:                    Address: [address]  [place] [address]  Email:[name]  Patient Height: 62.5 inches   Weight: 157.6 lbs  List all phone numbers and indicate the preferred number:  [  ]Home#: [phone]   [  ]Mobile #: [phone]   [  ]?Work#: [phone] Best Time to Call:                     Insurance Carrier: [employer] PPO Policy ID#: [id number]  Order Created by : Paulino Door, MD, Ree Kida   Phone: [phone]        PROCEDURE REQUEST  Location:  [ [facility]n Pares  ] [place], [facility]   [   ] [place] -- Open MRI  [   ] [place]     Date of Injury: _____________   Location of Pain: ______________    Severity of Pain:  [  ] SEVERE    [  ] MODERATE      [  ] MILD  [  ] Anesthesia  [  ] Sedation   [  ] Post Anesthesia Procedure: __________  Mechanism of Injury: _____________________________  History: _______________________________________  Order Notes: ___________________________________    MRI SCAN INFORMATION    08/22/2019 MRI Pelvis w/ Contrast [MR PELVIS] [CPT-72196] for ICD10-M89.9 signed by Ruffin Frederick, MD  Reason:  incidental pelivc ramus sclerotic lesion. eval furhter w/ MRI.          Medications for [name] #[id number] on 08/22/2019:  HYDROXYCHLOROQUINE SULFATE 200 MG ORAL TABLET (HYDROXYCHLOROQUINE SULFATE) one tablet oral daily; Route: ORAL  JARDIANCE 10 MG ORAL TABLET (EMPAGLIFLOZIN) Take 1 tablet by mouth once daily; Route:  ORAL  ROSUVASTATIN 10MG  TABLETS (ROSUVASTATIN CALCIUM) TAKE 1 TABLET BY MOUTH EVERY DAY  LISINOPRIL 2.5MG  TABLETS (LISINOPRIL) TAKE 1 TABLET BY MOUTH ONCE DAILY  METFORMIN HCL ER 500 MG XR24H-TAB (METFORMIN HCL) TAKE 4 TABLETS BY MOUTH EVERY MORNING  GLIPIZIDE ER 2.5 MG XR24H-TAB (GLIPIZIDE) TAKE 1 TABLET BY MOUTH DAILY  OMEPRAZOLE 20 MG ORAL CAPSULE DELAYED RELEASE (OMEPRAZOLE) Take one capsule by mouth twice a day; Route: ORAL  BACLOFEN 10MG  TABLETS (BACLOFEN) TAKE 1/2 TABLET BY MOUTH EVERY DAY AS NEEDED FOR BACK OR SPASMS  DICLOFENAC SODIUM 1 % TRANSDERMAL GEL (DICLOFENAC SODIUM) APP 2 GRAMS EXT AA BID      FREESTYLE FREEDOM LITE w/Device KIT (BLOOD GLUCOSE MONITORING SUPPL) use as directed (ICD 10 E11.65)      FREESTYLE LITE BLOOD GLUCOSE STRIPS (GLUCOSE BLOOD) USE TO CHECK BLOOD GLUCOSE THREE TIMES DAILY      FREESTYLE LANCETS (LANCETS) check fingerstick three times a day (ICD 10 E11.65)  TRAMADOL HCL 50 MG ORAL TABLET (TRAMADOL HCL) take one tablet daily as needed for pain; Route: ORAL  OXYCODONE HCL 5 MG ORAL TABLET (OXYCODONE HCL) Take one tablet  daily as needed for pain >8. Partial fill upon patient request.; Route: ORAL  CALCIUM CARBONATE-VITAMIN D 600-200 MG-UNIT ORAL TABLET (CALCIUM CARBONATE-VITAMIN D) take one tablet twice a day; Route: ORAL  GABAPENTIN 300MG  CAPSULES (GABAPENTIN) TAKE 1 CAPSULE BY MOUTH EVERY NIGHT AT BEDTIME AS NEEDED FOR PAIN  * BLOODWORK Please draw chemistry (sodium, potassium, magnesium, chloride, bicarbonate, glucose, creatinine, BUN), CBC, TSH, lipids, HbA1c. Fax result to [phone] Dr. Marlane Hatcher  PREDNISONE 10 MG ORAL TABLET (PREDNISONE) Take 3 tablets once a day; Route: ORAL  FAMOTIDINE 20 MG TABLET (FAMOTIDINE) TAKE 1 TABLET BY MOUTH AT NIGHT AS NEEDED  LIDODERM 5 % EXTERNAL PATCH (LIDOCAINE) Apply one patch to back once daily  as needed for pain, Remove after 12 hrs; Route: EXTERNAL  MYCOPHENOLATE MOFETIL 500 MG ORAL TABLET (MYCOPHENOLATE MOFETIL) Take 2 tablets twice daily;  Route: ORAL      Allergies for [name] #[id number] on 08/22/2019:  No known allergies    Problems for [name] #[id number] on 08/22/2019:  BONE LESION - PELVIS (ICD-733.90) (ICD10-M89.9)  GANGLION (ICD-727.43) (ICD10-M67.40)  INTERNAL HEMORRHOID (ICD-455.0) (OZH08-M57.8)  DIVERTICULOSIS, COLON (ICD-562.10) (ICD10-K57.30)  EROSIVE GASTRITIS (ICD-535.40) (ICD10-K29.60)  MUSCLE RUPTURE, NONTRAUMATIC (ICD-728.83) (ICD10-M62.10)  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-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.1)  SCREENING EXAM FOR BREAST CANCER (ICD-V76.10) (ICD10-Z12.39)  ANEMIA, OTHER (ICD-285.8) (ICD10-D64.89)  LOWER EXTREMITY EDEMA (ICD-782.3) (ICD10-R60.0)  IRREGULAR HEART BEATS (ICD-427.9) (ICD10-I49.9)  GI BLEED (ICD-578.9) (ION62-X52.2)  SCLERODERMA (ICD-710.1) (ICD10-M34.9)  SCREENING FOR CERVICAL CANCER (ICD-V76.2) (WUX32-G40.4)  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) (ICD10-M47.812)  NECK PAIN (ICD-723.1) (ICD10-M54.2)  STEROID USE, LONG TERM (ICD-V58.65) (ICD10-Z79.52)  PREMATURE VENTRICULAR CONTRACTIONS (ICD-427.69) (ICD10-I49.3)  HYPERLIPIDEMIA (ICD-272.4) (ICD10-E78.5)  BACK PAIN, LUMBAR, CHRONIC (ICD-724.2) (ICD10-M54.5)  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)  DIABETES MELLITUS, TYPE II (ICD-250.00) (ICD10-E11.9)  LONG-TERM (CURRENT) USE OF STEROIDS (ICD-V58.65) (ICD10-Z79.51)  SKIN SAGGING DUE TO WEIGHT LOSS (ICD-757.9) (ICD10-Q84.9)  TRANSAMINASES, SERUM, ELEVATED (ICD-790.4) (ICD10-R74.0)  OBESITY, BMI 30-34.9, ADULT (ICD-278.00) (ICD10-E66.9)      DIABETES MELLITUS, TYPE II, UNCONTROLLED (ICD-250.02) (ICD10-E11.65)      FATTY LIVER DISEASE (ICD-571.8) (ICD10-K76.0)  SCLERODERMA, 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)  HYPOTHYROIDISM (ICD-244.9) (ICD10-E03.9)  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) (ICD10-D80.8)  SPINAL STENOSIS, 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)  HEPATITIS C EXPOSURE (HCV RNA NEGATIVE, 01/2014) (ICD-V02.62) (ICD10-Z20.5)  PAIN IN JOINT, HAND (ICD-719.44) (ICD10-M79.643)  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) (ICD10-I25.3)  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

## 2019-08-22 NOTE — Telephone Encounter (Signed)
 RESPONSE/ORDERS:      received email from GI.  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  incidental findings need MRI pelvis. with constrast  study ordered.   should have f/u in [facility]ma after study.    .......................................Ruffin Frederick, MD  Aug 22, 2019 2:58 PM  I sent MRI order to be scheduled  .....................................Marland KitchenMarland KitchenAntonio Ranger  Aug 22, 2019 3:44 PM  s/w MRI and patient is booked for 5/25 at 7:10 AM  .....................................Marland KitchenMarland KitchenAntonio Ranger  Aug 25, 2019 10:52 AM    Zoe Lan can you also get a f/u w/ her PCP next available  .......................................Ruffin Frederick, MD  Aug 25, 2019 12:56 PM    I'll send a reminder to book an appt with the pt when her PCP's schedule is open. 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-Q21.1)  SCREENING EXAM FOR BREAST CANCER (ICD-V76.10) (ICD10-Z12.39)  ANEMIA, OTHER (ICD-285.8) (ICD10-D64.89)  LOWER EXTREMITY EDEMA (ICD-782.3) (ICD10-R60.0)  IRREGULAR HEART BEATS (ICD-427.9) (ICD10-I49.9)  GI BLEED (ICD-578.9) (WIO03-T59.2)  SCLERODERMA (ICD-710.1) (ICD10-M34.9)  SCREENING FOR CERVICAL CANCER (ICD-V76.2) (RCB63-A45.4)  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) (ICD10-M47.812)  NECK PAIN (ICD-723.1) (ICD10-M54.2)  STEROID USE, LONG TERM (ICD-V58.65) (ICD10-Z79.52)  PREMATURE VENTRICULAR CONTRACTIONS (ICD-427.69) (ICD10-I49.3)  HYPERLIPIDEMIA (ICD-272.4) (ICD10-E78.5)  BACK PAIN, LUMBAR,  CHRONIC (ICD-724.2) (ICD10-M54.5)  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)  DIABETES MELLITUS, TYPE II (ICD-250.00) (ICD10-E11.9)  LONG-TERM (CURRENT) USE OF STEROIDS (ICD-V58.65) (ICD10-Z79.51)  SKIN SAGGING DUE TO WEIGHT LOSS (ICD-757.9) (ICD10-Q84.9)  TRANSAMINASES, SERUM, ELEVATED (ICD-790.4) (ICD10-R74.0)  OBESITY, BMI 30-34.9, ADULT (ICD-278.00) (ICD10-E66.9)      DIABETES MELLITUS, TYPE II, UNCONTROLLED (ICD-250.02) (ICD10-E11.65)      FATTY LIVER DISEASE (ICD-571.8) (ICD10-K76.0)  SCLERODERMA, 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)  HYPOTHYROIDISM (ICD-244.9) (ICD10-E03.9)  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) (ICD10-D80.8)  SPINAL STENOSIS, 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)  HEPATITIS C EXPOSURE (HCV RNA NEGATIVE, 01/2014) (ICD-V02.62) (ICD10-Z20.5)  PAIN IN JOINT, HAND (ICD-719.44) (ICD10-M79.643)  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)  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)  CARPAL TUNNEL (ICD-354.0) (ICD10-G56.00)  OSTEOARTHRITIS (ICD-715.09) (ICD10-M15.9)  S/P ROTATOR CUFF SURGERY 12/2004 - RT SHOULDER; 2008 LT (ICD-V45.89)  Family Hx of MELANOMA, FAMILY HX (ICD-V16.8) (ICD10-Z80.8)  FRACTURE OF OTHER SPEC SITE,  PATHOLOGIC - MULTIPLE (ICD-733.19)  CHOLECYSTECTOMY AND HERNIA REPAIR (ICD-V45.89)  VITAMIN D DEFICIENCY (ICD-268.9) (ICD10-E55.9)  COLONOSCOPY, NEXT 2020- SEE COMMENT (ICD-V76.51) (ICD10-Z01.89)    Meds (prior to this call):   HYDROXYCHLOROQUINE SULFATE 200 MG ORAL TABLET (HYDROXYCHLOROQUINE SULFATE) one tablet oral daily; Route: ORAL  JARDIANCE 10 MG ORAL TABLET (EMPAGLIFLOZIN) Take 1 tablet by mouth once daily; Route: ORAL  ROSUVASTATIN 10MG  TABLETS (ROSUVASTATIN CALCIUM) TAKE 1 TABLET BY MOUTH EVERY DAY  LISINOPRIL 2.5MG  TABLETS (LISINOPRIL) TAKE 1 TABLET BY MOUTH ONCE DAILY  METFORMIN HCL ER 500 MG XR24H-TAB (METFORMIN HCL) TAKE 4 TABLETS BY MOUTH EVERY MORNING  GLIPIZIDE ER 2.5 MG XR24H-TAB (GLIPIZIDE) TAKE 1 TABLET BY MOUTH DAILY  OMEPRAZOLE 20 MG  ORAL CAPSULE DELAYED RELEASE (OMEPRAZOLE) Take one capsule by mouth twice a day;  Route: ORAL  BACLOFEN 10MG  TABLETS (BACLOFEN) TAKE 1/2 TABLET BY MOUTH EVERY DAY AS NEEDED FOR BACK OR SPASMS  DICLOFENAC SODIUM 1 % TRANSDERMAL GEL (DICLOFENAC SODIUM) APP 2 GRAMS EXT AA BID      FREESTYLE FREEDOM LITE w/Device KIT (BLOOD GLUCOSE MONITORING SUPPL) use as directed (ICD 10 E11.65)      FREESTYLE LITE BLOOD GLUCOSE STRIPS (GLUCOSE BLOOD) USE TO CHECK BLOOD GLUCOSE THREE TIMES DAILY      FREESTYLE LANCETS (LANCETS) check fingerstick three times a day (ICD 10 E11.65)  TRAMADOL HCL 50 MG ORAL TABLET (TRAMADOL HCL) take one tablet daily as needed for pain; Route: ORAL  OXYCODONE HCL 5 MG ORAL TABLET (OXYCODONE HCL) Take one tablet daily as needed for pain >8. Partial fill upon patient request.; Route: ORAL  CALCIUM CARBONATE-VITAMIN D 600-200 MG-UNIT ORAL TABLET (CALCIUM CARBONATE-VITAMIN D) take one tablet twice a day; Route: ORAL  GABAPENTIN 300MG  CAPSULES (GABAPENTIN) TAKE 1 CAPSULE BY MOUTH EVERY NIGHT AT BEDTIME AS NEEDED FOR PAIN  * BLOODWORK Please draw chemistry (sodium, potassium, magnesium, chloride, bicarbonate, glucose, creatinine, BUN), CBC, TSH, lipids, HbA1c. Fax result to [phone] Dr. Marlane Hatcher  PREDNISONE 10 MG ORAL TABLET (PREDNISONE) Take 3 tablets once a day; Route: ORAL  FAMOTIDINE 20 MG TABLET (FAMOTIDINE) TAKE 1 TABLET BY MOUTH AT NIGHT AS NEEDED  LIDODERM 5 % EXTERNAL PATCH (LIDOCAINE) Apply one patch to back once daily  as needed for pain, Remove after 12 hrs; Route: EXTERNAL  MYCOPHENOLATE MOFETIL 500 MG ORAL TABLET (MYCOPHENOLATE MOFETIL) Take 2 tablets twice daily; Route: ORAL            Created By Ruffin Frederick MD on 08/22/2019 at 02:57 PM    Electronically Signed By Ruffin Frederick MD on 08/25/2019 at 02:43 PM

## 2019-08-26 ENCOUNTER — Ambulatory Visit: Admitting: Hepatology

## 2019-08-26 ENCOUNTER — Ambulatory Visit: Admit: 2019-08-26

## 2019-08-26 NOTE — Progress Notes (Signed)
* * *      REAH, JUSTO [name] **DOB:** 02-13-1961 (59 yo F) **Acc No.** [id number] **DOS:**  08/26/2019    ---       [name], Meggan [name]**    ------    10 Y old Female, DOB: 11-Oct-1960    [address], [place], [place] [address]    Home: [phone]    Provider: Corwin Levins        * * *    Telephone Encounter    ---    Answered by  Keane Police Date: 08/26/2019       Time: 08:47 AM    Reason  6/3 appt cancelled    ------            Message                     lvm for patient resch appt to 6/10 at 4 and left the option to see milly same date in time instead (need them to confirm)                Action Taken                     [name],[name]  08/26/2019 8:50:46 AM >                     * * *                ---          * * *         Provider: Corwin Levins 08/26/2019    ---    Note generated by e[employer]Works EMR/PM Software ([email or web address])

## 2019-08-27 ENCOUNTER — Ambulatory Visit: Admitting: Rheumatology

## 2019-08-27 ENCOUNTER — Ambulatory Visit: Admitting: Hepatology

## 2019-08-27 ENCOUNTER — Ambulatory Visit (HOSPITAL_BASED_OUTPATIENT_CLINIC_OR_DEPARTMENT_OTHER): Admitting: Adult Health

## 2019-08-27 NOTE — Progress Notes (Signed)
* * *      [name], [name] **DOB:** Mar 07, 1961 (59 yo F) **Acc No.** [id number] **DOS:**  08/27/2019    ---       [name], [name]**    ------    89 Y old Female, DOB: 1961/02/19    [address], [place], [place] [address]    Home: [phone]    Provider: Corwin Levins        * * *    Telephone Encounter    ---    Answered by  [name] Date: 08/27/2019       Time: 09:44 AM    Caller  Patient    ------            Reason  call back request            Message                     Good Morning,            Patient called regarding the change of the appointment  date and time, patient would like a call back at the best contact phone : [phone]            Thank You                 Action Taken                     [facility]  08/27/2019 9:45:21 AM >      [name],[name]  08/28/2019 10:01:52 AM > please see above message      [name],[name]  08/28/2019 11:27:44 AM > resch with milly 6/3 at 1030                    * * *                ---          * * *         Provider: Corwin Levins 08/27/2019    ---    Note generated by e[employer]Works EMR/PM Software ([email or web address])

## 2019-08-27 NOTE — Progress Notes (Signed)
* * *      RANEY, [name] **DOB:** 1961-01-22 (59 yo F) **Acc No.** [id number] **DOS:**  08/27/2019    ---       [name], [name]**    ------    45 Y old Female, DOB: [birth year]    [address], [place], [place] [address]    Home: [phone]    Provider: [name]        * * *    Telephone Encounter    ---    Answered by  Denton Meek Date: 08/27/2019       Time: 05:39 PM    Reason  Appt!!!!    ------            Message                     Pt has question about Fibroscan.                 Action Taken                     [facility]  08/27/2019 5:39:39 PM > LVMx1.      [name],[name]  09/04/2019 4:50:01 PM > LVMx2.      [name],[name]  09/05/2019 8:32:39 AM > Pt LVM for me to call her back.      [name],[name]  09/05/2019 2:00:01 PM > Saw hepatology yesterday.  They gave her dx of hepatologist.  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  elevated values of GGT and other liver enzymes.  Her pain level is increasing-physiatry referred her to [facility] for pain management.  Her hands were doing well on mycophenolate and then she had another flare-swelling and now they are back to baseline.  She is looking for some clarification around her liver disease and etiology.  I think since she is having so much pain it would be helpful for her to touch base with [name] on 09/12/19 for them to touch base.        [name],[name]  09/05/2019 2:13:34 PM > Please book pt for 6/11 at 10:40 with [name].      Ye,[name]  09/05/2019 2:14:57 PM > all set.                    * * *                ---          * * *         Provider: [name] 08/27/2019    ---    Note generated by eClinicalWorks EMR/PM Software ([email or web address])

## 2019-08-29 ENCOUNTER — Ambulatory Visit

## 2019-08-29 NOTE — Progress Notes (Signed)
 [facility]  [address]  [place] [address]  Main: [phone]  Fax: [phone]  Patient Portal: [email or web address]        Aug 29, 2019            MRN: [id number]            DOB: 1961/03/12   [name]   [address]   [place] [address]      I am writing to let you know that the following appointments have been made for you.       Primary Care  [facility]  Your appointment is scheduled for 10/13/2019 at 1:40 PM  You are scheduled to see [name]  Additional appointment details: Annual Exam  If you need to make any changes, please call [phone]            Sincerely,         [name]  [facility]          Created By [facility] on 08/29/2019 at 11:31 AM    Electronically Signed By [employer] on 08/29/2019 at 11:32 AM

## 2019-09-02 ENCOUNTER — Ambulatory Visit: Admitting: Physical Medicine & Rehabilitation

## 2019-09-02 ENCOUNTER — Ambulatory Visit

## 2019-09-02 ENCOUNTER — Ambulatory Visit: Admit: 2019-09-02

## 2019-09-02 NOTE — Progress Notes (Signed)
 [name], [name] **DOB:** 1960/10/23 (59 yo F) **Acc No.** [id number] **DOS:**  09/02/2019    ---       [name], [name]**    ------    59 Y old Female, DOB: [birth year], External MRN: [id number]    Account Number: [id number]    [address], [place], [id number]    Home: [phone]    Guarantor: [name] Insurance: [id number] NH[employer] PPO    PCP: [name] Referring: [name] External Visit ID: [id number]    [facility]        * * *    09/02/2019 Progress Notes: [name] **CHN#:** [id number]    ------    ---       **Current Medications**    ---    Taking    * Acetaminophen 500 MG Tablet 2 tablets as needed Orally every 8 hrs, Notes: as needed    ---    * Baclofen 10 MG Tablet 1/2 tab Orally prn, Notes: prn    ---    * Calcium Carbonate-Vitamin D 600-200 MG-UNIT Capsule 1 capsule with a meal Orally Twice a day    ---    * Famotidine 40 MG Tablet 1 tablet at bedtime Orally Once a day, Notes: prn    ---    * Gammagard - Solution as directed Injection once every 4 weeks, Notes: every 4 weeks    ---    * GlipiZIDE ER 5 MG Tablet Extended Release 24 Hour TAKE 1 TABLET BY MOUTH EVERY DAY     ---    * GlipiZIDE XL 5 MG Tablet Extended Release 24 Hour 1 tablet Orally Once a day    ---    * Glucophage XR 500 mg Tablet Extended Release 24 Hour 4 tablets Orally Once a day    ---    * Hydroxychloroquine Sulfate 200 MG Tablet 1 tablet with food or milk Orally Once a day    ---    * Jardiance 10 MG Tablet 1 tablet Orally Once a day    ---    * Lidocaine 5 % Patch 1 patch remove after 12 hours Externally Once a day, Notes: prn    ---    * Lisinopril 2.5 MG Tablet 1 tablet Orally Once a day    ---    * Mycophenolate Mofetil 500 MG Tablet 2 tablet Orally twice daily    ---    * Omeprazole 40 mg Capsule Delayed Release 1 capsule Orally Once a day    ---    * Oxycodone HCl 5 MG Tablet 1 tablet as needed Orally every 4-6 hrs    ---    * PredniSONE 10 mg Tablet 1 tablet Orally  Once a day    ---    * Tramadol HCl 50 MG Tablet 1 tablet Orally every 6 hrs, Notes: PRN - rarely takes    ---    * Vitamin B Complex - Tablet 1 tablet Orally once daily    ---    * Vitamin D 25 MCG (1000 UT) Tablet 1 tablet Orally Once a day    ---    Not-Taking/PRN    * Rituxan 500 MG/50ML Solution as directed Intravenous , Notes: Last dose 05/19/19    ---     Past Medical History    ---      Scleroderma - CREST dx 2007.        ---  IgG4 deficiency s/p IVIG 2010 ----single infusion given preventively after  week of bilateral knee replacements at [facility] 2010.        ---    Fx left wrist in 1994.        ---    Blood clot at LUE in 2006 on a short course of Coumadin.        ---    Bilateral carpal tunnel syndrome s/p Lt carpal tunnel release and steroids  injection right--.        ---    Bone spur left foot.        ---    Scoliosis.        ---    Spinal stenosis s/p steroids injection.        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 Katrinka Blazing 2010--- never infected, but packed with antibiotics with  surgery;.        ---    Right rotator cuff repairs x 4, complicated by repeat tears, infection,  placement of anchor material.        ---    Elevated liver function tests.        ---    Diabetes.        ---    Seronegative RA.        ---    GI bleed .        ---    GERD.        ---      **Surgical History**    ---      Right rotator cuff repair, with infected hardware that had to be removed  2006    ---    Repeat right shoulder surgery, also which became infected. 2007    ---    Left rotator cuff repair 2008    ---    bilateral knee replacements 2009    ---    Left arthroscopic carpal tunnel release 01/2011    ---    ORIF Left 4th metatarsal bone    ---    Left Ulna shortening 1994    ---    Knee replacement 2010    ---    Hand surgery 2013, 2015    ---    remove gallbladder/hernia 1990    ---    Plate left foot 3rd metatarsal 2008    ---    Left reverse TSA 03/07/16     ---    Left Hamstring Repair 05/2019    ---      **Family History**    ---      Mother: deceased 57 yrs, lung cancer, hyperthyroidism, diagnosed with Other  malignant neoplasm of unspecified site    ---    Father: deceased 57s yrs, heart attack, Unspecified heart disease    ---    Siblings: alive, Brother - scleroderma, COPD    ---    3 brother(s) .    ---    Denies family history of GI cancer or liver disease.    ---      **Social History**    ---    Tobacco history: Never smoked.    Marital Status  Single.    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    Alcohol  Former daily EtOH use in 20s.    Abuse/Neglect Do you feel unsafe in your relationships? No, Plan Resources  Provided, Patient states they feel safe to return home, Have you  ever been  hit, kicked, punched or otherwise hurt by someone in the past year? No.    Illicit drugs: Denies.  Nonsmoker.    Lives with longstanding boyfriend.    ---      **Allergies**    ---      N.K.D.A.    ---    [name]]      **Hospitalization/Major Diagnostic Procedure**    ---      as above    ---    GI bleed 05/2019    ---      **Review of Systems**    ---     _ORT_ :    Eyes No. Ear, Nose Throat No. Digestion, Stomach, Bowel No. Bladder Problems  No. Bleeding Problems No. Numbness/Tingling No. Anxiety/Depression No.  Fever/Chills/Fatigue No. Chest Pain/Tightness/Palpitations No. Skin Rash No.  Dental Problems No. Joint/Muscle Pain/Cramps Yes. Blackout/Fainting No. Other  No.          **Reason for Appointment**    ---      1\. 4W FU    ---      **History of Present Illness**    ---     _General_ :    [name] for follow-up of right sided buttock and lateral leg pain. She  was last seen on 08-04-2019 at which time, we did trigger point injections.  She only got 2-3 days of relief with the last set of trigger point injections.  Since then, she has been in a lot of pain. It is graded 8/10. She has been  going to massage therapy, which helps  the lateral leg/ITB pain but still has  pain in the right SIJ area. She has not been able to have more dry needling  sessions because she was recently sick. She denies any weakness, numbness or  tingling into the right leg. She denies any new bowel or bladder dysfunction.      **Vital Signs**    ---    Pain scale 8, Ht-in 62, Wt-lbs 153, BMI 27.98, BSA 1.74, Ht-cm 157.48, Wt-kg  69.4, Wt Change -1 lb.      **Physical Examination**    ---    General: NAD    Psych: Mood and affect appropriate    HEENT: NC/AT    CV: warm and well perfused    Pulm: breathing unlabored, normal chest expansions    Skin: No rash, swelling, ecchymoses, or erythema    Gait: not antalgic; able to heel, toe and tandem walk    (+) TTP over right SIJ, gluteus maximus, right piriformis, right TFL, GTB;  left gluteus maximus    5/5 motor strength B/L    Sensation intact to light touch in all dermatomes    Reflex 1+ B/L patellar and Achilles    (-) Gaenslen's test    (-) SIJ compression test    No pain on transition from sitting to standing, standing to sitting.      **Assessments**    ---    1\. Trochanteric bursitis, right hip - M70.61 (Primary)    ---    2\. Iliotibial band syndrome of right side - M76.31    ---    3\. 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 Myofascial pain - M79.18    ---     [name] is a 59 year old female with left proximal hamstring tear  fixed surgically, who presents with persistent right buttock and lateral leg  pain. Her history and physical examination are most consistent with myofascial  pain coming from gluteus maximus, piriformis and tight ITB/TFL which improved  significantly with trigger point injections previously, but this did not help  the last visit. She has tenderness at the area of right SIJ, and could also  have SIJ dysfunction.    -Pt educated regarding diagnosises and treatment options     -Imaging reviewed and discussed with pt     -Tiers of treatment discussed     -Continue PT,  HEP as tolerated    -Continue dry needling PRN    -Right ITB hydrodissection performed today under ultrasound guidance, see procedure note below     -Cool compresses or warm heat to affected regions prn    -Rx for compound cream to be sent through [facility], apply PRN     -We discussed possibly consultation with a chronic pain specialist, there is no one here at [facility] unfortunately but gave her information for [facility] as an option. She will look into this as interventions have not provided much relief so far. We also discussed undertaking a diagnostic injection of SIJ under fluoroscopic guidance; she will consider this.    -Discussed red flag symptoms such as focal neurologic deficit, bowel or bladder dysfunction and to report to ED if they develop these symptoms    -Follow up in 8 wks    A total of 30 minutes was spent preparing for and during this encounter  including: review of imaging, performing medically appropriate examination,  counseling/education regarding treatment plans/injections and clinical  documentation into the electronic health record.    ---      **Procedures**    ---    RIGHT ITB corticosteroid injection/hydrodissection with ultrasound guidance    Findings: An ultrasound evaluation was performed of the ITB as it crosses the  lateral femoral condyle using a linear array transducer. The ITB was  visualized just superficial to the femoral condyle. There was no effusion  noted, sonopalpation confirmed area of pain. The ITB was then scanned  proximally, no increased edema visualized.    Procedure note: After risks, benefits and alternative treatment options and  prognosis were discussed, the patient signed informed consent. The ITB was  evaluated under ultrasound using a linear array transducer. The findings  are described above. The region was then prepped with Chloroprep and a sterile  probe cover applied. A gel standoff was applied. The probe was positioned in  the  coronal plane parallel to the long axis of the ITB. A wheal of 2mL of 1%  lidocaine was injected as local anesthetic. A 25G 5" needle was then inserted  about 2-3cm from the cephalad end of the probe and directed just underneath  the ITB using an in-plane approach under direct ultrasound visualization.  After negative aspiration, 10mL total was then placed consisting of 40mg   Kenalog and 9mL 1% lidocaine without resistance all along the ITB for  hydrodissection. Excellent flow and fluid distention was noted confirming  placement. There was no bleeding or complications. Post-procedure care  instructions were given and the patient was instructed to schedule follow-up  visit in 4-6 wks. The patient tolerated the procedure well and reported some  relief in her symptoms immediately after procedure completed.      **Procedure Codes**    ---      66063 02: Injection(s) single tendon origin/insertion, Modifiers: RT    ---    01601 03: U/S guidance  for needle placement, Modifiers: RT    ---    Z6109 KENALOG 40MG /1 ML VIAL, Modifiers: RT    ---     **Follow Up**    ---    2 Months    Electronically signed by [name] on 09/02/2019 at 10:20 AM EDT    Sign off status: Completed        * * *        [facility]    [address]    [address]    [place], [place] [address]    Tel: [phone]    Fax: [phone]              * * *          Progress Note: [name] [facility] 09/02/2019    ---    Note generated by eClinicalWorks EMR/PM Software ([email or web address])

## 2019-09-02 NOTE — Progress Notes (Signed)
 .  Progress Notes  .  Patient: [name] [name], [name] [name] [facility]  [name]    .  DOB: Aug 31, 1960 Age: 59 Y Sex: Female  .  PCP: [name] [name]    Date: 09/02/2019  .  --------------------------------------------------------------------------------  .  REASON FOR APPOINTMENT  .  1. 4W FU  .  HISTORY OF PRESENT ILLNESS  .  General:   [name] [name] for follow-up of right sided buttock and lateral  leg pain. She was last seen on 08-04-2019 at which time, we did  trigger point injections. She only got 2-3 days of relief with  the last set of trigger point injections. Since then, she has  been in a lot of pain. It is graded 8/10. She has been going to  massage therapy, which helps the lateral leg/ITB pain but still  has pain in the right SIJ area. She has not been able to have  more dry needling sessions because she was recently sick. She  denies any weakness, numbness or tingling into the right leg. She  denies any new bowel or bladder dysfunction.  .  CURRENT MEDICATIONS  .  Taking Acetaminophen 500 MG Tablet 2 tablets as needed Orally  every 8 hrs, Notes: as needed  Taking Baclofen 10 MG Tablet 1/2 tab Orally prn, Notes: prn  Taking Calcium Carbonate-Vitamin D 600-200 MG-UNIT Capsule 1  capsule with a meal Orally Twice a day  Taking Famotidine 40 MG Tablet 1 tablet at bedtime Orally Once a  day, Notes: prn  Taking Gammagard - Solution as directed Injection once every 4  weeks, Notes: every 4 weeks  Taking GlipiZIDE ER 5 MG Tablet Extended Release 24 Hour TAKE 1  TABLET BY MOUTH EVERY DAY  Taking GlipiZIDE XL 5 MG Tablet Extended Release 24 Hour 1 tablet  Orally Once a day  Taking Glucophage XR 500 mg Tablet Extended Release 24 Hour 4  tablets Orally Once a day  Taking Hydroxychloroquine Sulfate 200 MG Tablet 1 tablet with  food or milk Orally Once a day  Taking Jardiance 10 MG Tablet 1 tablet Orally Once a day  Taking Lidocaine 5 % Patch 1 patch remove after 12 hours  Externally Once a day, Notes: prn  Taking Lisinopril  2.5 MG Tablet 1 tablet Orally Once a day  Taking Mycophenolate Mofetil 500 MG Tablet 2 tablet Orally twice  daily  Taking Omeprazole 40 mg Capsule Delayed Release 1 capsule Orally  Once a day  Taking Oxycodone HCl 5 MG Tablet 1 tablet as needed Orally every  4-6 hrs  Taking PredniSONE 10 mg Tablet 1 tablet Orally Once a day  Taking Tramadol HCl 50 MG Tablet 1 tablet Orally every 6 hrs,  Notes: PRN - rarely takes  Taking Vitamin B Complex - Tablet 1 tablet Orally once daily  Taking Vitamin D 25 MCG (1000 UT) Tablet 1 tablet Orally Once a  day  Not-Taking/PRN Rituxan 500 MG/50ML Solution as directed  Intravenous , Notes: Last dose 05/19/19  .  PAST MEDICAL HISTORY  .  Scleroderma - CREST dx 2007  IgG4 deficiency s/p IVIG 2010 ----single infusion given  preventively after week of bilateral knee replacements at [facility]  2010  Fx left wrist in 1994  Blood clot at LUE in 2006 on a short course of Coumadin  Bilateral carpal tunnel syndrome s/p Lt carpal tunnel release and  steroids injection right--  Bone spur left foot  Scoliosis  Spinal stenosis s/p steroids injection  s/p bilateral knee replacements  for valgus /arthritic  complications, performed by Dr. Katrinka Blazing 2010--- never infected, but  packed with antibiotics with surgery;  Right rotator cuff repairs x 4, complicated by repeat tears,  infection, placement of anchor material  Elevated liver function tests  Diabetes  Seronegative RA  GI bleed  GERD  .  ALLERGIES  .  N.K.D.A.  .  SURGICAL HISTORY  .  Right rotator cuff repair, with infected hardware that had to be  removed 2006  Repeat right shoulder surgery, also which became infected. 2007  Left rotator cuff repair 2008  bilateral knee replacements 2009  Left arthroscopic carpal tunnel release 01/2011  ORIF Left 4th metatarsal bone  Left Ulna shortening 1994  Knee replacement 2010  Hand surgery 2013, 2015  remove gallbladder/hernia 1990  Plate left foot 3rd metatarsal 2008  Left reverse TSA 03/07/16  Left Hamstring Repair  05/2019  .  FAMILY HISTORY  .  Mother: deceased 47 yrs, lung cancer, hyperthyroidism, diagnosed  with Other malignant neoplasm of unspecified site  Father: deceased 33s yrs, heart attack, Unspecified heart disease  Siblings: alive, Brother - scleroderma, COPD  3 brother(s) .  Denies family history of GI cancer or liver disease.  .  SOCIAL HISTORY  .  .  Tobacco  history: Never smoked.  .  Marital Status Single.  .  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  .  Alcohol Former daily EtOH use in 20s.  .  Abuse/NeglectDo you feel unsafe in your relationships?No ,  PlanResources Provided, Patient states they feel safe to return  home , Have you ever been hit, kicked, punched or otherwise hurt  by someone in the past year? No.  .  Illicit drugs: Denies.  .  Nonsmoker.Lives with longstanding boyfriend.  [name]  HOSPITALIZATION/MAJOR DIAGNOSTIC PROCEDURE  .  as above  GI bleed 05/2019  .  REVIEW OF SYSTEMS  .  ORT:  .  Eyes    No . Ear, Nose Throat    No . Digestion, Stomach, Bowel     No . Bladder Problems    No . Bleeding Problems    No .  Numbness/Tingling    No . Anxiety/Depression    No .  Fever/Chills/Fatigue    No . Chest Pain/Tightness/Palpitations     No . Skin Rash    No . Dental Problems    No . Joint/Muscle  Pain/Cramps    Yes . Blackout/Fainting    No . Other    No .  .  VITAL SIGNS  .  Pain scale 8, Ht-in 62, Wt-lbs 153, BMI 27.98, BSA 1.74, Ht-cm  157.48, Wt-kg 69.4, Wt Change -1 lb.  .  PHYSICAL EXAMINATION  .  General: NAD Psych: Mood and affect appropriate HEENT: NC/AT CV:  warm and well perfused Pulm: breathing unlabored, normal chest  expansions Skin: No rash, swelling, ecchymoses, or erythemaGait:  not antalgic; able to heel, toe and tandem walk(+) TTP over right  SIJ, gluteus maximus, right piriformis, right TFL, GTB; left  gluteus maximus5/5 motor strength B/LSensation intact to light  touch in all dermatomesReflex 1+ B/L patellar and Achilles(-)  Gaenslen's test(-) SIJ compression testNo pain on  transition from  sitting to standing, standing to sitting.  .  ASSESSMENTS  .  Trochanteric bursitis, right hip - M70.61 (Primary)  .  Iliotibial band syndrome of right side - M76.31  .  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  Myofascial pain - M79.18  .  [name] [name]  [name] is a 59 year old female with left proximal  hamstring tear fixed surgically, who presents with persistent  right buttock and lateral leg pain. Her history and physical  examination are most consistent with myofascial pain coming from  gluteus maximus, piriformis and tight ITB/TFL which improved  significantly with trigger point injections previously, but this  did not help the last visit. She has tenderness at the area of  right SIJ, and could also have SIJ dysfunction.-Pt educated  regarding diagnosises and treatment options -Imaging reviewed and  discussed with pt -Tiers of treatment discussed -Continue PT, HEP  as tolerated-Continue dry needling PRN-Right ITB hydrodissection  performed today under ultrasound guidance, see procedure note  below -Cool compresses or warm heat to affected regions prn-Rx  for compound cream to be sent through [facility], apply PRN  -We discussed possibly consultation with a chronic pain  specialist, there is no one here at [facility] unfortunately but  gave her information for [facility] as an option. She will  look into this as interventions have not provided much relief so  far. We also discussed undertaking a diagnostic injection of SIJ  under fluoroscopic guidance; she will consider this.-Discussed  red flag symptoms such as focal neurologic deficit, bowel or  bladder dysfunction and to report to ED if they develop these  symptoms-Follow up in 8 wksA total of 30 minutes was spent  preparing for and during this encounter including: review of  imaging, performing medically appropriate examination,  counseling/education regarding treatment plans/injections and  clinical documentation into the  electronic health record.  [name]  PROCEDURES  .  RIGHT ITB corticosteroid injection/hydrodissection  with ultrasound guidance Findings: An ultrasound evaluation was  performed of the ITB as it crosses the lateral femoral condyle  using a linear array transducer. The ITB was visualized  just superficial to the femoral condyle. There was no effusion  noted, sonopalpation confirmed area of pain. The ITB was then  scanned proximally, no increased edema visualized. Procedure  note: After risks, benefits and alternative treatment options and  prognosis were discussed, the patient signed informed consent.  The ITB was evaluated under ultrasound using a linear array  transducer. The findings are described above. The region was then  prepped with Chloroprep and a sterile probe cover applied. A gel  standoff was applied. The probe was positioned in the coronal  plane parallel to the long axis of the ITB. A wheal of 2mL of 1%  lidocaine was injected as local anesthetic. A 25G 5" needle was  then inserted about 2-3cm from the cephalad end of the probe and  directed just underneath the ITB using an in-plane approach under  direct ultrasound visualization. After negative aspiration, 10mL  total was then placed consisting of 40mg  Kenalog and 9mL 1%  lidocaine without resistance all along the ITB for  hydrodissection. Excellent flow and fluid distention was noted  confirming placement. There was no bleeding or complications.  Post-procedure care instructions were given and the patient was  instructed to schedule follow-up visit in 4-6 wks. The patient  tolerated the procedure well and reported some relief in her  symptoms immediately after procedure completed.  [name]  PROCEDURE CODES  .  44034 02: Injection(s) single tendon origin/insertion, Modifiers:  RT  .  74259 03: U/S guidance for needle placement, Modifiers: RT  .  J3301 KENALOG 40MG /1 ML VIAL, Modifiers: RT  .  FOLLOW UP  .  2 Months  .  Electronically signed by [name] on 09/02/2019  at 10:20 AM EDT  .  Document electronically signed by [name]    .

## 2019-09-04 ENCOUNTER — Ambulatory Visit: Admitting: Registered Nurse

## 2019-09-04 ENCOUNTER — Ambulatory Visit: Admitting: Internal Medicine

## 2019-09-04 ENCOUNTER — Ambulatory Visit: Admitting: Hepatology

## 2019-09-04 ENCOUNTER — Ambulatory Visit

## 2019-09-04 ENCOUNTER — Ambulatory Visit: Admit: 2019-09-04

## 2019-09-04 MED ORDER — Ursodiol: 500 | Tablet | Freq: Two times a day (BID) | 5 refills | 0 days | Status: AC

## 2019-09-04 MED ORDER — MiraLax: 17 | 30 | Freq: Every day | 3 refills | 0 days | Status: AC

## 2019-09-04 NOTE — Progress Notes (Signed)
 [name] [name], [name] [name] **DOB:** Oct 23, 1960 (59 yo F) **Acc No.** [id number] **DOS:**  09/04/2019    ---       [name] [name], [name] [name]**    ------    59 Y old Female, DOB: May 20, 1960, External MRN: [id number]    Account Number: [id number]    [address], [place], [id number]    Home: [phone]    Guarantor: [name] [name] Insurance: [id number] NHP PPO    PCP: Ruffin Frederick Referring: Ruffin Frederick External Visit ID: [id number]    [facility]        * * *    09/04/2019  **Appointment Provider:** Aleyda Gindlesperger LAI Haziel Molner **CHN#:** [id number]    ------    ---       **Current Medications**    ---    Taking    * Acetaminophen 500 MG Tablet 2 tablets as needed Orally every 8 hrs, Notes: as needed    ---    * Baclofen 10 MG Tablet 1/2 tab Orally prn, Notes: prn    ---    * Calcium Carbonate-Vitamin D 600-200 MG-UNIT Capsule 1 capsule with a meal Orally Twice a day    ---    * Famotidine 40 MG Tablet 1 tablet at bedtime Orally Once a day, Notes: prn    ---    * Gammagard - Solution as directed Injection once every 4 weeks, Notes: every 4 weeks    ---    * GlipiZIDE ER 5 MG Tablet Extended Release 24 Hour TAKE 1 TABLET BY MOUTH EVERY DAY     ---    * GlipiZIDE XL 5 MG Tablet Extended Release 24 Hour 1 tablet Orally Once a day    ---    * Glucophage XR 500 mg Tablet Extended Release 24 Hour 4 tablets Orally Once a day    ---    * Hydroxychloroquine Sulfate 200 MG Tablet 1 tablet with food or milk Orally Once a day    ---    * Jardiance 10 MG Tablet 1 tablet Orally Once a day    ---    * Lidocaine 5 % Patch 1 patch remove after 12 hours Externally Once a day, Notes: prn    ---    * Lisinopril 2.5 MG Tablet 1 tablet Orally Once a day    ---    * Mycophenolate Mofetil 500 MG Tablet 2 tablet Orally twice daily    ---    * Omeprazole 40 mg Capsule Delayed Release 1 capsule Orally Once a day    ---    * Oxycodone HCl 5 MG Tablet 1 tablet as needed Orally every 4-6 hrs    ---    * PredniSONE 10 mg Tablet 1 tablet Orally Once a day     ---    * Tramadol HCl 50 MG Tablet 1 tablet Orally every 6 hrs, Notes: PRN - rarely takes    ---    * Vitamin B Complex - Tablet 1 tablet Orally once daily    ---    * Vitamin D 25 MCG (1000 UT) Tablet 1 tablet Orally Once a day    ---    Not-Taking/PRN    * Rituxan 500 MG/50ML Solution as directed Intravenous , Notes: Last dose 05/19/19    ---     Past Medical History    ---      Scleroderma - CREST dx 2007.        ---  IgG4 deficiency s/p IVIG 2010 ----single infusion given preventively after  week of bilateral knee replacements at [facility] 2010.        ---    Fx left wrist in 1994.        ---    Blood clot at LUE in 2006 on a short course of Coumadin.        ---    Bilateral carpal tunnel syndrome s/p Lt carpal tunnel release and steroids  injection right--.        ---    Bone spur left foot.        ---    Scoliosis.        ---    Spinal stenosis s/p steroids injection.        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 Katrinka Blazing 2010--- never infected, but packed with antibiotics with  surgery;.        ---    Right rotator cuff repairs x 4, complicated by repeat tears, infection,  placement of anchor material.        ---    Elevated liver function tests.        ---    Diabetes.        ---    Seronegative RA.        ---    GI bleed .        ---    GERD.        ---      **Surgical History**    ---      Right rotator cuff repair, with infected hardware that had to be removed  2006    ---    Repeat right shoulder surgery, also which became infected. 2007    ---    Left rotator cuff repair 2008    ---    bilateral knee replacements 2009    ---    Left arthroscopic carpal tunnel release 01/2011    ---    ORIF Left 4th metatarsal bone    ---    Left Ulna shortening 1994    ---    Knee replacement 2010    ---    Hand surgery 2013, 2015    ---    remove gallbladder/hernia 1990    ---    Plate left foot 3rd metatarsal 2008    ---    Left reverse TSA 03/07/16    ---    Left  Hamstring Repair 05/2019    ---      **Family History**    ---      Mother: deceased 76 yrs, lung cancer, hyperthyroidism, diagnosed with Other  malignant neoplasm of unspecified site    ---    Father: deceased 55s yrs, heart attack, Unspecified heart disease    ---    Siblings: alive, Brother - scleroderma, COPD    ---    3 brother(s) .    ---    Denies family history of GI cancer or liver disease.    ---      **Social History**    ---    Tobacco history: Never smoked.    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    Alcohol    _Former daily EtOH use in 20s_    Abuse/Neglect    Do you feel unsafe in your relationships? _No_    Plan _Resources Provided, Patient  states they feel safe to return home_    Have you ever been hit, kicked, punched or otherwise hurt by someone in the  past year? _No_    Illicit drugs: Denies.  Nonsmoker.    Lives with longstanding boyfriend.    ---      **Allergies**    ---      N.K.D.A.    ---    [name] [name] Verified]      **Hospitalization/Major Diagnostic Procedure**    ---      as above    ---    GI bleed 05/2019    ---      **Review of Systems**    ---     _GI/Hepatology_ :    Constitutional No recent weight change, no fever, no fatigue, no change in  appetite. Gastrointestinal No loss of appetite, no change in bowel movement,  no nausea or vomiting, no frequent diarrhea, no constipation, no painful bowl  movements, no rectal bleeding, no blood in stool, no abdominal pain, or peptic  ulcer disease. Cardiovascular No heart disease, chest pain, angina,  palpitations, shortness of breath, swelling of feet, ankles, or hands.  Neurological No frequent headaches, lightheaded or dizziness,  seizures/convulsions, tremors, paralysis, stroke, head injury, numbness.  Respiratory No chronic cough/phlegm production, spitting up blood, shortness  or breath, asthma or wheezing. Endocrine No glandular or hormone problems,  thyroid disease, diabetes,  heat/cold intolerance, frequent urination/excessive  thirst. Hematologic/Lymphatic Not slow to heal after cuts. No bleeding or  bruising easily, anemia, phlebitis, enlargement of glands, or past blood  transfusion. Integumentary (Skin, Breast) No rash or itching, change in color  of skin, change in hair or nails, breast pain, breast lump, or breast  discharge. Genitourinary No frequent urination, burning or painful urination,  blood in urine, incontinence, sexual difficulty, or kidney stones.  Musculoskeletal No joint pain, muscle aches, or joint swelling.          **Reason for Appointment**    ---      1\. 4W FU    ---      **History of Present Illness**    ---     _GENERAL_ :    Ms. 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 From  last visit, prednisone was weaned off hugely from 20mg  daily (for over 10  years) to 10mg  every 3 days with the introduction of mycophenolate for her  scleroderma. She lost about 50 lbs compared with 2017.    Patient was last seen in the Hepatology clinic for abnormal LFTs back in 2017  with Dr Althea Charon and she subsequently lost follow up from 2017-2021. Her  transaminitis dates back in 2001. Prior workup includes: AMA negative, ASMA  negative, HBV sAg non-reactive / e Ag non-reactive, c Ab non-reactive / DNA  not detected, HCV RNA not detected, Hepatitis A IgM negative, ceruloplasmin  and alpha-1 antitrypsin within normal limits, normal iron indices and  ferritin, TSH wnl, celiac serologies negative. GGT was elevated to 1417 in  2017 and recently to 1688 in 2021.    CT 08/2019 showed Diffuse low-density of the liver most likely represents  hepatic steatosis.  No focal liver lesion.  On a side finding for the CT, there is left superior pubic ramus bone lesion  in which Pelvis MRI with intravenous contrast is recommended. 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 No further imaging follow-up is necessary.  Degenerative changes in the lower lumbar spine and both hips without change.    Today she reports RLQ pain triggered by meal, the pain last for an hour,  usually 15 min after meal with any kind of food. She feels early satiety with  bloating. She has tried to avoid triggering food but the help is minimal.    Most recent liver chemistries (06/2019): AST 37, ALT 91, alk phos 302, total  bili 0.5. Also with normal platelet count and INR.    Reports a history of almost daily alcohol use in her 77s (for about 8 years),  but has consumed rare EtOH for the past 20 years. Denies any other OTC  medications, herbal supplements other than vitamins and cranberry supplement.     _Ambulatory Falls and Injury Prevention_ :    HPI    Have you experienced a fall in the past year? _No_    Is the patient using assistive devices such as a cane or walker? _No_    Do you need assistance with ambulation while at our facility? _No_    Interventions _none, patient not a fall risk , ensured environment free from  obstacles and obstructions_      **Vital Signs**    ---    Pain scale 0, Ht-in 62, Wt-lbs 155.0, BMI 28.35, BP 124/81, HR 87, BSA 1.75,  O2 99%RA, Wt-kg 70.31.      **Physical Examination**    ---    General: well developed, well nourished, in no acute distress    HEENT: normocephalic, atraumatic    Eyes: sclera anicteric    Neck: supple    Cardiovascular: regular rate and rhythm    Lungs: clear to auscultation bilaterally    GI: positive bowel sounds, soft, nontender, nondistended    Extremities: no significant edema, warm and  well perfused    Neurologic: alert and oriented times 3, grossly nonfocal, normal gait    Psychiatric: appropriate mood and affect.      **Assessments**    ---    1\. Abnormal liver function tests - R79.89 (Primary)    ---    2\. Abdominal pain - R10.9    ---    3\. Gastroparesis - K31.84    ---    4\. Abdominal bloating - R14.0    ---    5\. On corticosteroid therapy - Z79.52    ---    6\. Elevated alkaline phosphatase level - R74.8    ---     Ms. [name] [name] is a 59 yo F pt with a PMHx scleroderma, prior obesity,  CVID, IgG-4 deficiency, GERD, DM2, hypothyroidism and inflammatory arthritis  who presents for evaluation of abnormal liver chemistries and recent complaint  of RLQ abdominal pain.    Patient has had persistent, asymptomatic transaminase elevation dating back to  2007. She has had prior laboratory and imaging work up from 2015-2017 that was  unremarkable. [place] in the past have shown coarsened and heterogeneous  echotexture that could be consistent with fatty liver. DDx for her abnormal  liver chesmitries include NASH/NAFLD (prior BMI>40, metaboilc comorbidities),  alcohol  related fatty liver disease (former use in her 20's), drug induced  liver injury from IVIG. Her AMA is negative. Multiphasic CT with liver  protocol did not show any structural abnormality or portal vein thrombosis.  She does not have any sign of liver cirrhosis or decompensation.    Her alk is also chronically elevated to more than 2x ULN, I recommended liver  biopsy to rule out underlying portal inflammation at the bile duct level and  start urso depend upon biopsy result. However, patient would like to start  urso to see response in 6 months. She will be on urso 1000mg  a day.    Her worsening abdominal pain with bloating in the setting of T2DM and other  metabolic diseases likely due to gastroparesis. She will need to avoid  constipation by adding miralax daily.    ---      **Treatment**    ---      **1\. Abnormal liver function  tests**    Start MiraLax Packet, 17 GM, 1 packet mixed with 8 ounces of fluid, Orally,  Once a day, 30 day(s), 30, Refills 3    Start Ursodiol Tablet, 500 MG, 1 tablet, Orally, Twice a day, 30 day(s), 60  Tablet, Refills 5    _LAB: Alkaline Phosphatase (ALK)_    _LAB: Bilirubin, Total (TBIL)_    _LAB: Iron (IRON)_    _LAB: Aspartate aminotransferase (AST)_    _LAB: Gamma Glutamyl Transferase (GGT)_    _LAB: Alanine aminotransferase (ALT)_    _LAB: Ferritin (FER)_    _LAB: Basic Metabolic Panel (BMP)_    _LAB: Iron/TIBC (IRONP)_    _LAB: PT Prothrombin time/INR (PT)_    _LAB: Immunoglobulin A IgA (IGA)_    _LAB: Immunoglobulin G IgG (IGG)_    _LAB: Immunoglobulin M IgM (IGM)_    _LAB: IgG Subcls (IGGSB)_    _LAB: CBC/DIFF with PLT (CBCWD)_    Notes: 1. lab on the day of rhematology appt in 2 weeks - print slip    2\. rx urosodial    3\. appt in 03/2020 with Dr Danelle Earthly with lab in 03/2020.    ---     **Follow Up**    ---    6 Months    **Appointment Provider:** Tarrie Mcmichen LAI Kassidi Elza    Electronically signed by Cebastian Neis LAI Shirleen Mcfaul , NP on 09/05/2019 at 12:01 AM EDT    Sign off status: Completed        * * *        GI Clinic    [address] [place], [address]    [place], [place] [address]    Tel: [phone]    Fax: [phone]              * * *          Progress Note: Wang Granada LAI Devyne Hauger 09/04/2019    ---    Note generated by eClinicalWorks EMR/PM Software ([email or web address])

## 2019-09-04 NOTE — Progress Notes (Signed)
 .  Progress Notes  .  Patient: [name], [name]  Provider: NG, MIU LAI    .  DOB: November 11, 1960 Age: 59 Y Sex: Female  .  PCP: Ruffin Frederick    Date: 09/04/2019  .  --------------------------------------------------------------------------------  .  REASON FOR APPOINTMENT  .  1. 4W FU  .  HISTORY OF PRESENT ILLNESS  .  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 From last visit, prednisone was weaned  off hugely from 20mg  daily (for over 10 years) to 10mg  every 3  days with the introduction of mycophenolate for her scleroderma.  She lost about 50 lbs compared with 2017. Patient was last seen  in the Hepatology clinic for abnormal LFTs back in 2017 with Dr  Althea Charon and she subsequently lost follow up from 2017-2021. Her  transaminitis dates back in 2001. Prior workup includes: AMA  negative, ASMA negative, HBV sAg non-reactive / e Ag  non-reactive, c Ab non-reactive / DNA not detected, HCV RNA not  detected, Hepatitis A IgM negative, ceruloplasmin and alpha-1  antitrypsin within normal limits, normal iron indices and  ferritin, TSH wnl, celiac serologies negative. GGT was elevated  to 1417 in 2017 and recently to 1688 in 2021. CT 08/2019 showed  Diffuse low-density of the liver most likely represents hepatic  steatosis. No focal liver lesion.On a side finding for the CT,  there is left superior pubic ramus bone lesion in which Pelvis  MRI with intravenous contrast is recommended. 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 No  further  imaging follow-up is necessary. Degenerative changes in the lower  lumbar spine and both hips without change. Today she reports RLQ  pain triggered by meal, the pain last for an hour, usually 15 min  after meal with any kind of food. She feels early satiety with  bloating. She has tried to avoid triggering food but the help is  minimal. Most recent liver chemistries (06/2019): AST 37, ALT 91,  alk phos 302, total bili 0.5. Also with normal platelet count and  INR. Reports a history of almost daily alcohol use in her 51s  (for about 8 years), but has consumed rare EtOH for the past 20  years. Denies any other OTC medications, herbal supplements other  than vitamins and cranberry supplement.  .  Ambulatory Falls and Injury Prevention:  HPI  .  [name]  Have you experienced a fall in the past year?No  Is the patient using assistive devices such as a cane or  walker?No  Do you need assistance with ambulation while at our facility?No  Interventionsnone, patient not a fall risk , ensured environment  free from obstacles and obstructions  .  CURRENT MEDICATIONS  .  Taking Acetaminophen 500 MG Tablet 2 tablets as needed Orally  every 8 hrs, Notes: as needed  Taking Baclofen 10 MG Tablet 1/2 tab Orally prn, Notes: prn  Taking Calcium Carbonate-Vitamin D 600-200 MG-UNIT Capsule 1  capsule with a meal  Orally Twice a day  Taking Famotidine 40 MG Tablet 1 tablet at bedtime Orally Once a  day, Notes: prn  Taking Gammagard - Solution as directed Injection once every 4  weeks, Notes: every 4 weeks  Taking GlipiZIDE ER 5 MG Tablet Extended Release 24 Hour TAKE 1  TABLET BY MOUTH EVERY DAY  Taking GlipiZIDE XL 5 MG Tablet Extended Release 24 Hour 1 tablet  Orally Once a day  Taking Glucophage XR 500 mg Tablet Extended Release 24 Hour 4  tablets Orally Once a day  Taking Hydroxychloroquine Sulfate 200 MG Tablet 1 tablet with  food or milk Orally Once a day  Taking Jardiance 10 MG Tablet 1 tablet Orally Once a day  Taking Lidocaine 5  % Patch 1 patch remove after 12 hours  Externally Once a day, Notes: prn  Taking Lisinopril 2.5 MG Tablet 1 tablet Orally Once a day  Taking Mycophenolate Mofetil 500 MG Tablet 2 tablet Orally twice  daily  Taking Omeprazole 40 mg Capsule Delayed Release 1 capsule Orally  Once a day  Taking Oxycodone HCl 5 MG Tablet 1 tablet as needed Orally every  4-6 hrs  Taking PredniSONE 10 mg Tablet 1 tablet Orally Once a day  Taking Tramadol HCl 50 MG Tablet 1 tablet Orally every 6 hrs,  Notes: PRN - rarely takes  Taking Vitamin B Complex - Tablet 1 tablet Orally once daily  Taking Vitamin D 25 MCG (1000 UT) Tablet 1 tablet Orally Once a  day  Not-Taking/PRN Rituxan 500 MG/50ML Solution as directed  Intravenous , Notes: Last dose 05/19/19  .  PAST MEDICAL HISTORY  .  Scleroderma - CREST dx 2007  IgG4 deficiency s/p IVIG 2010 ----single infusion given  preventively after week of bilateral knee replacements at [facility]  2010  Fx left wrist in 1994  Blood clot at LUE in 2006 on a short course of Coumadin  Bilateral carpal tunnel syndrome s/p Lt carpal tunnel release and  steroids injection right--  Bone spur left foot  Scoliosis  Spinal stenosis s/p steroids injection  s/p bilateral knee replacements for valgus /arthritic  complications, performed by Dr. Katrinka Blazing 2010--- never infected, but  packed with antibiotics with surgery;  Right rotator cuff repairs x 4, complicated by repeat tears,  infection, placement of anchor material  Elevated liver function tests  Diabetes  Seronegative RA  GI bleed  GERD  .  ALLERGIES  .  N.K.D.A.  .  SURGICAL HISTORY  .  Right rotator cuff repair, with infected hardware that had to be  removed 2006  Repeat right shoulder surgery, also which became infected. 2007  Left rotator cuff repair 2008  bilateral knee replacements 2009  Left arthroscopic carpal tunnel release 01/2011  ORIF Left 4th metatarsal bone  Left Ulna shortening 1994  Knee replacement 2010  Hand surgery 2013, 2015  remove gallbladder/hernia  1990  Plate left foot 3rd metatarsal 2008  Left reverse TSA 03/07/16  Left Hamstring Repair 05/2019  .  FAMILY HISTORY  .  Mother: deceased 21 yrs, lung cancer, hyperthyroidism, diagnosed  with Other malignant neoplasm of unspecified site  Father: deceased 83s yrs, heart attack, Unspecified heart disease  Siblings: alive, Brother - scleroderma, COPD  3 brother(s) .  Denies family history of GI cancer or liver disease.  .  SOCIAL HISTORY  .  .  Tobacco  history: Never smoked.  .  .  Marital Status  Single  .  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  .  .  Alcohol  Former daily EtOH use in 20s  .  [name]  Abuse/Neglect  Do you feel unsafe in your relationships?No  PlanResources Provided, Patient states they feel safe to return  home  Have you ever been hit, kicked, punched or otherwise hurt by  someone in the past year? No  .  .  Illicit drugs: Denies.  .  Nonsmoker.Lives with longstanding boyfriend.  [name]  HOSPITALIZATION/MAJOR DIAGNOSTIC PROCEDURE  .  as above  GI bleed 05/2019  .  REVIEW OF SYSTEMS  .  GI/Hepatology:  .  Constitutional    No recent weight change, no fever, no fatigue,  no change in appetite . Gastrointestinal    No loss of appetite,  no change in bowel movement, no nausea or vomiting, no frequent  diarrhea, no constipation, no painful bowl movements, no rectal  bleeding, no blood in stool, no abdominal pain, or peptic ulcer  disease . Cardiovascular    No heart disease, chest pain, angina,  palpitations, shortness of breath, swelling of feet, ankles, or  hands . Neurological    No frequent headaches, lightheaded or  dizziness, seizures/convulsions, tremors, paralysis, stroke, head  injury, numbness . Respiratory    No chronic cough/phlegm  production, spitting up blood, shortness or breath, asthma or  wheezing . Endocrine    No glandular or hormone problems, thyroid  disease, diabetes, heat/cold intolerance, frequent  urination/excessive thirst . Hematologic/Lymphatic    Not slow to  heal after  cuts. No bleeding or bruising easily, anemia,  phlebitis, enlargement of glands, or past blood transfusion .  Integumentary (Skin, Breast)    No rash or itching, change in  color of skin, change in hair or nails, breast pain, breast lump,  or breast discharge . Genitourinary    No frequent urination,  burning or painful urination, blood in urine, incontinence,  sexual difficulty, or kidney stones . Musculoskeletal    No joint  pain, muscle aches, or joint swelling .  [name]  VITAL SIGNS  .  Pain scale 0, Ht-in 62, Wt-lbs 155.0, BMI 28.35, BP 124/81, HR  87, BSA 1.75, O2 99%RA, Wt-kg 70.31.  [name]  PHYSICAL EXAMINATION  .  General: well developed, well nourished, in no acute  distressHEENT: normocephalic, atraumaticEyes: sclera  anictericNeck: suppleCardiovascular: regular rate and  rhythmLungs: clear to auscultation bilaterallyGI: positive bowel  sounds, soft, nontender, nondistendedExtremities: no significant  edema, warm and well perfusedNeurologic: alert and oriented times  3, grossly nonfocal, normal gaitPsychiatric: appropriate mood and  affect.  .  ASSESSMENTS  .  Abnormal liver function tests - R79.89 (Primary)  .  Abdominal pain - R10.9  .  Gastroparesis - K31.84  .  Abdominal bloating - R14.0  .  On corticosteroid therapy - Z79.52  .  Elevated alkaline phosphatase level - R74.8  .  [name] is a 59 yo F pt with a PMHx scleroderma, prior  obesity, CVID, IgG-4 deficiency, GERD, DM2, hypothyroidism and  inflammatory arthritis who presents for evaluation of abnormal  liver chemistries and recent complaint of RLQ abdominal pain.  Patient has had persistent, asymptomatic transaminase elevation  dating back to 2007. She has had prior laboratory and imaging  work up from 2015-2017 that was unremarkable. [place] in the past have  shown coarsened and heterogeneous echotexture that could be  consistent with fatty liver. DDx for her abnormal liver  chesmitries include NASH/NAFLD (prior BMI>40, metaboilc  comorbidities),  alcohol related fatty liver disease (former  use  in her 20's), drug induced liver injury from IVIG. Her AMA is  negative. Multiphasic CT with liver protocol did not show any  structural abnormality or portal vein thrombosis. She does not  have any sign of liver cirrhosis or decompensation. Her alk is  also chronically elevated to more than 2x ULN, I recommended  liver biopsy to rule out underlying portal inflammation at the  bile duct level and start urso depend upon biopsy result.  However, patient would like to start urso to see response in 6  months. She will be on urso 1000mg  a day. Her worsening abdominal  pain with bloating in the setting of T2DM and other metabolic  diseases likely due to gastroparesis. She will need to avoid  constipation by adding miralax daily.  .  TREATMENT  .  Abnormal liver function tests  Start MiraLax Packet, 17 GM, 1 packet mixed with 8 ounces of  fluid, Orally, Once a day, 30 day(s), 30, Refills 3  Start Ursodiol Tablet, 500 MG, 1 tablet, Orally, Twice a day, 30  day(s), 60 Tablet, Refills 5  LAB: Alkaline Phosphatase (ALK)  .  LAB: Bilirubin, Total (TBIL)  .  LAB: Iron (IRON)  .  LAB: Aspartate aminotransferase (AST)  .  LAB: Gamma Glutamyl Transferase (GGT)  .  LAB: Alanine aminotransferase (ALT)  .  LAB: Ferritin (FER)  .  LAB: Basic Metabolic Panel (BMP)  .  LAB: Iron/TIBC (IRONP)  .  LAB: PT Prothrombin time/INR (PT)  .  LAB: Immunoglobulin A IgA (IGA)  .  LAB: Immunoglobulin G IgG (IGG)  .  LAB: Immunoglobulin M IgM (IGM)  .  LAB: IgG Subcls (IGGSB)  .  LAB: CBC/DIFF with PLT (CBCWD)  .  Notes: 1. lab on the day of rhematology appt in 2 weeks - print  slip  2. rx urosodial  3. appt in 03/2020 with Dr Danelle Earthly with lab in 03/2020.  .  FOLLOW UP  .  6 Months  .  [name]  Appointment Provider: MIU LAI NG  .  Electronically signed by MIU LAI NG , NP on  09/05/2019 at 12:01 AM EDT  .  Document electronically signed by NG, MIU LAI    .

## 2019-09-04 NOTE — Progress Notes (Signed)
* * *      TALA, EBER [name] **DOB:** 1960-10-07 (59 yo F) **Acc No.** [id number] **DOS:**  09/04/2019    ---       [name], Tayelor [name]**    ------    12 Y old Female, DOB: 24-Nov-1960    [address], [place], [place] [address]    Home: [phone]    Provider: Sharlee Rufino LAI        * * *    Telephone Encounter    ---    Answered by  Felecia Jan Date: 09/04/2019       Time: 11:29 AM    Reason  checkout list    ------            Message                     1.fu booked      2.labs booked 2 weeks before fu      3.outside lab request given                 Action Taken                     [facility]  09/04/2019 11:29:39 AM                     * * *                ---          * * *         Provider: Rogue Pautler LAI 09/04/2019    ---    Note generated by eClinicalWorks EMR/PM Software ([email or web address])

## 2019-09-09 ENCOUNTER — Ambulatory Visit: Admitting: Rheumatology

## 2019-09-09 NOTE — Progress Notes (Signed)
* * *      [name], [name] **DOB:** 1960-06-14 (59 yo F) **Acc No.** [id number] **DOS:**  09/09/2019    ---       [name], [name]**    ------    75 Y old Female, DOB: December 04, 1960    [address], [place], [place] [address]    Home: [phone]    Provider: [name]        * * *    Telephone Encounter    ---    Answered by  Denton Meek Date: 09/09/2019       Time: 10:56 AM    Caller  [name]    ------            Reason  [facility]            Message                     Renew Gammaguard      [phone]                Action Taken                     [facility]  09/09/2019 10:57:34 AM > Recent notes sent                    * * *                ---          * * *         Provider: [name] 09/09/2019    ---    Note generated by eClinicalWorks EMR/PM Software ([email or web address])

## 2019-09-12 ENCOUNTER — Ambulatory Visit

## 2019-09-12 ENCOUNTER — Ambulatory Visit: Admitting: Physician Assistant

## 2019-09-12 ENCOUNTER — Ambulatory Visit: Admitting: Sports Medicine

## 2019-09-12 ENCOUNTER — Ambulatory Visit: Admitting: Rheumatology

## 2019-09-12 ENCOUNTER — Ambulatory Visit: Admitting: Physical Medicine & Rehabilitation

## 2019-09-12 ENCOUNTER — Ambulatory Visit: Admit: 2019-09-12

## 2019-09-12 LAB — HX CHEM-OTHER
HX % IRON SATURATION: 17 % (ref 15–40)
HX CALCIUM (CA): 9.6 mg/dL (ref 8.5–10.5)
HX FERRITIN: 111 ng/mL (ref 10–240)
HX IRON: 64 ug/dL (ref 37–170)
HX TOTAL IRON BINDING CAPACITY: 384 ug/dL (ref 253–463)
HX TRANSFERRIN: 274 mg/dL (ref 181–331)

## 2019-09-12 LAB — HX CHEM-PANELS
HX ANION GAP: 10 (ref 3–14)
HX BLOOD UREA NITROGEN: 21 mg/dL (ref 6–24)
HX CHLORIDE (CL): 105 meq/L (ref 98–110)
HX CO2: 21 meq/L (ref 20–30)
HX CREATININE (CR): 0.67 mg/dL (ref 0.57–1.30)
HX GFR, AFRICAN AMERICAN: 113 mL/min/{1.73_m2}
HX GFR, NON-AFRICAN AMERICAN: 98 mL/min/{1.73_m2}
HX GLUCOSE: 103 mg/dL (ref 70–139)
HX POTASSIUM (K): 4.3 meq/L (ref 3.6–5.1)
HX SODIUM (NA): 136 meq/L (ref 135–145)

## 2019-09-12 LAB — HX CHEM-LFT
HX ALANINE AMINOTRANSFERASE (ALT/SGPT): 45 IU/L (ref 0–54)
HX ALKALINE PHOSPHATASE (ALK): 217 IU/L — ABNORMAL HIGH (ref 40–130)
HX ASPARTATE AMINOTRANFERASE (AST/SGOT): 30 IU/L (ref 10–42)
HX BILIRUBIN, TOTAL: 0.5 mg/dL (ref 0.2–1.1)
HX GAMMA GLUTAMYL TRANSFERASE (GGT): 1081 IU/L — ABNORMAL HIGH (ref 0–59)

## 2019-09-12 LAB — HX HEM-ROUTINE
HX BASO #: 0 10*3/uL (ref 0.0–0.2)
HX BASO: 0 %
HX EOSIN #: 0 10*3/uL (ref 0.0–0.5)
HX EOSIN: 0 %
HX HCT: 42.6 % (ref 32.0–45.0)
HX HGB: 13.6 g/dL (ref 11.0–15.0)
HX IMMATURE GRANULOCYTE#: 0 10*3/uL (ref 0.0–0.1)
HX IMMATURE GRANULOCYTE: 0 %
HX LYMPH #: 0.8 10*3/uL — ABNORMAL LOW (ref 1.0–4.0)
HX LYMPH: 7 %
HX MCH: 29.2 pg (ref 26.0–34.0)
HX MCHC: 31.9 g/dL — ABNORMAL LOW (ref 32.0–36.0)
HX MCV: 91.6 fL (ref 80.0–98.0)
HX MONO #: 0.4 10*3/uL (ref 0.2–0.8)
HX MONO: 4 %
HX MPV: 10.5 fL (ref 9.1–11.7)
HX NEUT #: 9.5 10*3/uL — ABNORMAL HIGH (ref 1.5–7.5)
HX NRBC #: 0 10*3/uL
HX NUCLEATED RBC: 0 %
HX PLT: 252 10*3/uL (ref 150–400)
HX RBC BLOOD COUNT: 4.65 M/uL (ref 3.70–5.00)
HX RDW: 15 % — ABNORMAL HIGH (ref 11.5–14.5)
HX SEG NEUT: 88 %
HX WBC: 10.7 10*3/uL (ref 4.0–11.0)

## 2019-09-12 LAB — HX IMMUNOLOGY
HX IMMUNOGLOBULIN A: 61 mg/dL — ABNORMAL LOW (ref 70–360)
HX IMMUNOGLOBULIN G: 640 mg/dL (ref 540–1822)
HX IMMUNOGLOBULIN M: 297 mg/dL — ABNORMAL HIGH (ref 22–293)

## 2019-09-12 LAB — HX COAGULATION
HX INR PT: 0.9 (ref 0.9–1.3)
HX PROTHROMBIN TIME: 10.5 s (ref 9.7–14.0)
HX PTT: 30.6 s (ref 25.7–35.7)

## 2019-09-12 LAB — HX DIABETES: HX GLUCOSE: 103 mg/dL (ref 70–139)

## 2019-09-12 NOTE — Progress Notes (Signed)
 [name], [name] **DOB:** [birth year] (59 yo F) **Acc No.** [id number] **DOS:**  09/12/2019    ---       [name], [name]**    ------    59 Y old Female, DOB: [birth year], External MRN: [id number]    Account Number: [id number]    [address], [place], [id number]    Home: [phone]    Guarantor: [name] Insurance: [id number] PPO    PCP: Ruffin Frederick Referring: Ruffin Frederick    Appointment Facility: [facility]        * * *    09/12/2019 Progress Notes: [name], MD **CHN#:** [id number]    ------    ---       **History of Present Illness**    ---     _GENERAL_ :    This patient was seen and evaluated with [place] [facility], KG Kompa.    [name] is a 59 year old women with a PMH of scleroderma, GI bleed, carpal  tunnel, stage II liver fibrosis, and ?Paget's who presents for follow-up.    .    She started the CellCept in April and feels that this has made a big  difference in her scleroderma symptoms. She noticed less hand stiffness and  pain, although she is still unable to fully straighten her fingers. She does  notice some cracking and dry skin at the tips of her fingers when she gets  Raynaud's for a prolonged period of time. She is happy with the medication and  does not notice any side effects. She is doing so well she is able to take 10  mg prednisone every 4 days, which is a large decrease.    [name]    Her main concern today is her back and leg pain, which prevents her from  walking normally. She torn her left hamstring a couple of months ago which was  surgically repaired. She has no pain on the side of the surgery. She says that  she has pain that radiated down from her back and down her right leg. She has  tried accupuncture and lidocaine injections into her IT band which have helped  somewhat but she is still quite debilitated by the pain. She cannot lay flat  on her back for long periods of time without becoming "short of breath" due to  the pain.    .    In  regards to her other medical issues, she is concerned whether or not she  should get a liver biopsy to confirm her fibrosis. She was recently started on  ursidiol given the CT and MRI findings of fibrosis. In addition, the MRI  results mention a lesion on her left superior pubic ramus which was consistent  with Paget's disease based on imaging from 10 years ago. However, there is not  an MRI or CT from 10 years ago in our system that mentions this finding. [name]  was confused by this finding, and felt that this could not explain her recent  back and leg pain.    .    ROS: Positive for hair loss, back pain, leg pain, joint swelling.    Denies dry mouth, mouth sores, skin changes, rashes, CP, SOB.    [name]    Recent tests:    08/11/19 CT A/P    IMPRESSION:    Heterogeneous left superior pubic ramus 2.5 cm focus of sclerosis of uncertain  significance.    Diffuse low-density of the liver most likely represents hepatic steatosis. No  focal liver lesion.    08/26/19 MRI Pelvis    IMPRESSION:    * The sclerotic lesion in the vicinity of the left superior pubic ramus and pubic symphysis dating back 10 years is consistent with Paget's disease at this site. No further imaging follow-up is necessary.     * Degenerative changes in the lower lumbar spine and both hips without change.      **Current Medications**    ---    Taking    * Acetaminophen 500 MG Tablet 2 tablets as needed Orally every 8 hrs, Notes: as needed    ---    * Baclofen 10 MG Tablet 1/2 tab Orally prn, Notes: prn    ---    * Calcium Carbonate-Vitamin D 600-200 MG-UNIT Capsule 1 capsule with a meal Orally Twice a day    ---    * Famotidine 40 MG Tablet 1 tablet at bedtime Orally Once a day, Notes: prn    ---    * Gammagard - Solution as directed Injection once every 4 weeks, Notes: every 4 weeks    ---    * glipiZIDE ER 5 MG Tablet Extended Release 24 Hour TAKE 1 TABLET BY MOUTH EVERY DAY     ---    * glipiZIDE XL 5 MG Tablet Extended Release 24 Hour 1 tablet  Orally Once a day    ---    * Glucophage XR 500 mg Tablet Extended Release 24 Hour 4 tablets Orally Once a day    ---    * Hydroxychloroquine Sulfate 200 MG Tablet 1 tablet with food or milk Orally Once a day    ---    * Jardiance 10 MG Tablet 1 tablet Orally Once a day    ---    * Lidocaine 5 % Patch 1 patch remove after 12 hours Externally Once a day, Notes: prn    ---    * Lisinopril 2.5 MG Tablet 1 tablet Orally Once a day    ---    * Mycophenolate Mofetil 500 MG Tablet 2 tablet Orally twice daily    ---    * Omeprazole 40 mg Capsule Delayed Release 1 capsule Orally Once a day    ---    * oxyCODONE HCl 5 MG Tablet 1 tablet as needed Orally every 4-6 hrs    ---    * predniSONE 10 mg Tablet 1 tablet Orally Once a day    ---    * traMADol HCl 50 MG Tablet 1 tablet Orally every 6 hrs, Notes: PRN - rarely takes    ---    * Ursodiol 500 MG Tablet 1 tablet Orally Twice a day    ---    * Vitamin B Complex - Tablet 1 tablet Orally once daily    ---    * Vitamin D 25 MCG (1000 UT) Tablet 1 tablet Orally Once a day    ---    Not-Taking/PRN    * MiraLax 17 GM Packet 1 packet mixed with 8 ounces of fluid Orally Once a day    ---    * Rituxan 500 MG/50ML Solution as directed Intravenous , Notes: Last dose 05/19/19    ---    * Medication List reviewed and reconciled with the patient    ---      **Past Medical History**    ---  Scleroderma - CREST dx 2007.        ---    IgG4 deficiency s/p IVIG 2010 ----single infusion given preventively after  week of bilateral knee replacements at [facility] 2010.        ---    Fx left wrist in 1994.        ---    Blood clot at LUE in 2006 on a short course of Coumadin.        ---    Bilateral carpal tunnel syndrome s/p Lt carpal tunnel release and steroids  injection right--.        ---    Bone spur left foot.        ---    Scoliosis.        ---    Spinal stenosis s/p steroids injection.        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 Katrinka Blazing  2010--- never infected, but packed with antibiotics with  surgery;.        ---    Right rotator cuff repairs x 4, complicated by repeat tears, infection,  placement of anchor material.        ---    Elevated liver function tests.        ---    Diabetes.        ---    Seronegative RA.        ---    GI bleed .        ---    GERD.        ---    Pagets disease.        ---    Stage II fibrosis of Liver.        ---      **Surgical History**    ---      Right rotator cuff repair, with infected hardware that had to be removed  2006    ---    Repeat right shoulder surgery, also which became infected. 2007    ---    Left rotator cuff repair 2008    ---    bilateral knee replacements 2009    ---    Left arthroscopic carpal tunnel release 01/2011    ---    ORIF Left 4th metatarsal bone    ---    Left Ulna shortening 1994    ---    Knee replacement 2010    ---    Hand surgery 2013, 2015    ---    remove gallbladder/hernia 1990    ---    Plate left foot 3rd metatarsal 2008    ---    Left reverse TSA 03/07/16    ---    Left Hamstring Repair 05/2019    ---      **Family History**    ---      Mother: deceased 69 yrs, lung cancer, hyperthyroidism, diagnosed with Other  malignant neoplasm of unspecified site    ---    Father: deceased 69s yrs, heart attack, Unspecified heart disease    ---    Siblings: alive, Brother - scleroderma, COPD    ---    3 brother(s) .    ---    Denies family history of GI cancer or liver disease.    ---      **Allergies**    ---      N.K.D.A.    ---      **Hospitalization/Major Diagnostic  Procedure**    ---      as above    ---    GI bleed 05/2019    ---      **Review of Systems**    ---     _Rheumatology_ :    General No fever, weight loss, swollen glands. Muscoskeletal +Joint pain,  +joint swelling, +morning stiffness . Eyes No vision loss, eye dry, red eye,  eye pain. Mouth No mouth sores. Cardiovascular ? raynaud, no chest pain ,  irregular heart beat, racing heart beat, leg swellingUpcoming Stress  Test.  Pulmonary No cough, cough blood, shortness of breath. Skin No rash,  photosensitivity. Lymphatics No swollen glands, tender glands.         **Vital Signs**    ---    Pain scale 6, Ht-in 62, Wt-lbs 155, BMI 28.35, BP 115/75, HR 94    98%.      **Physical Examination**    ---    GEN: Well-appearing women sitting comfortably, NAD    HEENT: no scleral icterus, no conjunctiva injection, EOMI    CV: RRR, normal S1, S2, no murmurs, rubs, or gallops    Pulm: CTA bilaterally, no wheezes, rales, or rhonchi    Skin: Sclerodactyly present over bilaterally hands, clubbing of fingernails  present bilaterally    MSK:    Upper extremity: some mild joint swelling of the PIPs and DIPs bilaterally,  PIPs, DIPs, and MCPs mildly tender to palpation, bilateral wrist stiffness and  limited ROM, elbows and shoulders without abnormalitiy    Lower extremity: knees without tenderness or effusion, ankles without  tenderness or effusion    Neck and Back: no point tenderness over the neck, no paraspinal tenderness,  very tender over the bilateral buttocks areas    Gait: walks with a limp.      **Assessments**    ---    1\. 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 Scleroderma - M34.9    ---    3\. 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 Long-term use of high-risk medication - Z79.899    ---    5\. PFO (patent foramen ovale) - Q21.1    ---     Regarding her inflammatory arthritis, Omaira seems to be doing very well on  CellCept after several weeks. She continues to get IVIG infusions which are  critical in terms of preventing infections given her immunosuppression and  CVID. She does still have significant back and leg pain, which is inhibiting  her ability to walk. The recent diagnosis of possible Paget's and stage II  liver fibrosis are unlikely to cause this type of pain, however the Paget's  question warrants further investigation given that we are unable to identify  the study from 10  years ago used for reference. We will order a bone scan  today to determine if there is Paget's occurring and if this could be in a  different area of her skeleton that would better explain her right sided back  and leg pain. On her CT, there was evidence of grade 1 anterolisthesis of L4  relative to L5. She should continue to stay active and taper off of the  prednisone as able.    .    From a GI and cardiology standpoint she has been stable. She has no evidence  of ongoing bleeding. She continues to have elevated LFT's of unclear etiology.    [name]  PLAN:    - Whole body bone scan    - Encourage physical activity    - Follow-up via telemedicine to discuss imaging results    - Continue CellCept, recent labs show no toxicity, repeat today including  some requested by GI.    ---      **Treatment**    ---      **1\. Seronegative rheumatoid arthritis**    _IMAGING: NM Bone Scan- Whole Body_    ---         **2\. Scleroderma**    _LAB: Iron (IRON)_    _LAB: Gamma Glutamyl Transferase (GGT)_   Value Reference Range    ---------    Gamma Glutamyl Transferase (GGT) 1081 H 0 - 59 - IU/L    _LAB: Ferritin (FER)_  Value Reference Range    ---------    Ferritin 111  10 - 240 - ng/mL    _LAB: Basic Metabolic Panel (BMP)_  Value Reference Range    ---------    Anion Gap 10  3 - 14 -    Calcium (Ca) 9.6  8.5 - 10.5 - mg/dL    ------------    Chloride (CL) 105  98 - 110 - mEq/L    ------------    CO2 21  20 - 30 - mEq/L    ------------    Creatinine (CR) 0.67  0.57 - 1.30 - mg/dL    ------------    Glucose 103  70 - 139 - mg/dL    ------------    Potassium (K) 4.3  3.6 - 5.1 - mEq/L    ------------    Sodium (NA) 136  135 - 145 - mEq/L    ------------    Blood Urea Nitrogen 21  6 - 24 - mg/dL    ------------    _LAB: Hepatic Function/Liver Function (LFTP)_    _LAB: Iron/TIBC (IRONP)_  Value Reference Range    ---------    Iron 64  37 - 170 - ug/dL    Total  Iron Binding Capacity 384  253 - 463 - ug/dL    ------------    Transferrin 274  181 - 331 - mg/dL    ------------    % Iron Satutation 17  15 - 40 - %    ------------    _LAB: PTT (PTT)_  Value Reference Range    ---------    PTT 30.6  25.7 - 35.7 - sec    _LAB: Manual Prothrombin Time_    _LAB: Immunoglobulin A IgA (IGA)_  Value Reference Range    ---------    Immunoglobulin A 61 L 70 - 360 - mg/dL    _LAB: Immunoglobulin G IgG (IGG)_  Value Reference Range    ---------    Immunoglobulin G 640  540 - 1822 - mg/dL    _LAB: Immunoglobulin M IgM (IGM)_  Value Reference Range    ---------    Immunoglobulin M 297 H 22 - 293 - mg/dL    _LAB: IgG Subcls (IGGSB)_  Value Reference Range    ---------    IgG Subclass-1 275 L 382-929 - mg/dL    IgG Subclass-2 161 L 241-700 - mg/dL    ------------    IgG Subclass-3 13 L 22-178 - mg/dL    ------------    IgG Subclass-4 8.6  4.0-86.0 - mg/dL    ------------    IgG Subclass Total 610  620 067 6687 - mg/dL    ------------    _LAB: CBC/DIFF with PLT (CBCWD)_  Value  Reference Range    ---------    WBC 10.7  4.0 - 11.0 - K/uL    RBC 4.65  3.70 - 5.00 - M/uL    ------------    HGB 13.6  11.0 - 15.0 - g/dL    ------------    HCT 42.6  32.0 - 45.0 - %    ------------    MCV 91.6  80.0 - 98.0 - fL    ------------    MCH 29.2  26.0 - 34.0 - pg    ------------    MCHC 31.9 L 32.0 - 36.0 - g/dL    ------------    RDW 15.0 H 11.5 - 14.5 - %    ------------    PLT 252  150 - 400 - K/uL    ------------    MPV 10.5  9.1 - 11.7 - fL    ------------    SEG NEUT 88   \- %    ------------    LYMPH 7   \- %    ------------    MONO 4   \- %    ------------    EOS 0   \- %    ------------    BASO 0   \- %    ------------    NEUT # 9.5 H 1.5 - 7.5 - K/uL    ------------    LYMPH # 0.8 L 1.0 - 4.0 - K/uL    ------------    MONO #  0.4  0.2 - 0.8 - K/uL    ------------    EOSIN # 0.0  0.0 - 0.5 - K/uL    ------------    BASO # 0.0  0.0 - 0.2 - K/uL    ------------    Imm Grnas 0   \- %    ------------    NRBC 0   \- %    ------------    Imm Grans, Abs 0.0  0.0 - 0.1 - K/uL    ------------    NRBC, Abs 0.0  <0.0 - K/uL    ------------      **Follow Up**    ---    2 Months    Electronically signed by Erasmo Leventhal , MD on 09/17/2019 at 08:26 PM EDT    Sign off status: Completed        * * *        Rheumatology, Allergy and Immunology    [address]    [address], [address]    [place], [place] [address]    Tel: [phone]    Fax: [phone]              * * *          Progress Note: [name], MD 09/12/2019    ---    Note generated by [employer] EMR/PM Software ([email or web address])

## 2019-09-12 NOTE — Progress Notes (Signed)
 [name] [name], [name] [name] **DOB:** 11/04/1960 (59 yo F) **Acc No.** [id number] **DOS:**  09/12/2019    ---       [name] [name] [name]**    ------    33 Y old Female, DOB: 08-12-1960, External MRN: [id number]    Account Number: [id number]    [address], [place], [place]    Home: [phone]    Guarantor: [name] Insurance: [id number] [employer] PPO    PCP: Ruffin Frederick Referring: Ruffin Frederick External Visit ID: [id number]    [facility]        * * *    09/12/2019  **Appointment Provider:** Rowyn Spilde WHITE, PA **CHN#:** [id number]    ------     **Supervising Provider:** Rhoderick Moody    ---       **Current Medications**    ---    Taking    * Acetaminophen 500 MG Tablet 2 tablets as needed Orally every 8 hrs, Notes: as needed    ---    * Baclofen 10 MG Tablet 1/2 tab Orally prn, Notes: prn    ---    * Calcium Carbonate-Vitamin D 600-200 MG-UNIT Capsule 1 capsule with a meal Orally Twice a day    ---    * Famotidine 40 MG Tablet 1 tablet at bedtime Orally Once a day, Notes: prn    ---    * Gammagard - Solution as directed Injection once every 4 weeks, Notes: every 4 weeks    ---    * glipiZIDE ER 5 MG Tablet Extended Release 24 Hour TAKE 1 TABLET BY MOUTH EVERY DAY     ---    * glipiZIDE XL 5 MG Tablet Extended Release 24 Hour 1 tablet Orally Once a day    ---    * Glucophage XR 500 mg Tablet Extended Release 24 Hour 4 tablets Orally Once a day    ---    * Hydroxychloroquine Sulfate 200 MG Tablet 1 tablet with food or milk Orally Once a day    ---    * Jardiance 10 MG Tablet 1 tablet Orally Once a day    ---    * Lidocaine 5 % Patch 1 patch remove after 12 hours Externally Once a day, Notes: prn    ---    * Lisinopril 2.5 MG Tablet 1 tablet Orally Once a day    ---    * MiraLax 17 GM Packet 1 packet mixed with 8 ounces of fluid Orally Once a day    ---    * Mycophenolate Mofetil 500 MG Tablet 2 tablet Orally twice daily    ---    * Omeprazole 40 mg Capsule Delayed Release 1 capsule  Orally Once a day    ---    * oxyCODONE HCl 5 MG Tablet 1 tablet as needed Orally every 4-6 hrs    ---    * predniSONE 10 mg Tablet 1 tablet Orally Once a day    ---    * traMADol HCl 50 MG Tablet 1 tablet Orally every 6 hrs, Notes: PRN - rarely takes    ---    * Ursodiol 500 MG Tablet 1 tablet Orally Twice a day    ---    * Vitamin B Complex - Tablet 1 tablet Orally once daily    ---    * Vitamin D 25 MCG (1000 UT) Tablet 1 tablet Orally Once a day    ---  Not-Taking/PRN    * Rituxan 500 MG/50ML Solution as directed Intravenous , Notes: Last dose 05/19/19    ---    * Medication List reviewed and reconciled with the patient    ---     Past Medical History    ---      Scleroderma - CREST dx 2007.        ---    IgG4 deficiency s/p IVIG 2010 ----single infusion given preventively after  week of bilateral knee replacements at [facility] 2010.        ---    Fx left wrist in 1994.        ---    Blood clot at LUE in 2006 on a short course of Coumadin.        ---    Bilateral carpal tunnel syndrome s/p Lt carpal tunnel release and steroids  injection right--.        ---    Bone spur left foot.        ---    Scoliosis.        ---    Spinal stenosis s/p steroids injection.        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 Katrinka Blazing 2010--- never infected, but packed with antibiotics with  surgery;.        ---    Right rotator cuff repairs x 4, complicated by repeat tears, infection,  placement of anchor material.        ---    Elevated liver function tests.        ---    Diabetes.        ---    Seronegative RA.        ---    GI bleed .        ---    GERD.        ---      **Surgical History**    ---      Right rotator cuff repair, with infected hardware that had to be removed  2006    ---    Repeat right shoulder surgery, also which became infected. 2007    ---    Left rotator cuff repair 2008    ---    bilateral knee replacements 2009    ---    Left arthroscopic carpal tunnel  release 01/2011    ---    ORIF Left 4th metatarsal bone    ---    Left Ulna shortening 1994    ---    Knee replacement 2010    ---    Hand surgery 2013, 2015    ---    remove gallbladder/hernia 1990    ---    Plate left foot 3rd metatarsal 2008    ---    Left reverse TSA 03/07/16    ---    Left Hamstring Repair 05/2019    ---      **Family History**    ---      Mother: deceased 40 yrs, lung cancer, hyperthyroidism, diagnosed with Other  malignant neoplasm of unspecified site    ---    Father: deceased 81s yrs, heart attack, Unspecified heart disease    ---    Siblings: alive, Brother - scleroderma, COPD    ---    3 brother(s) .    ---    Denies family history of GI cancer or liver disease.    ---      **  Social History**    ---    Tobacco history: Never smoked.    Marital Status  Single.    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    Alcohol  Former daily EtOH use in 20s.    Abuse/Neglect Do you feel unsafe in your relationships? No, Plan Resources  Provided, Patient states they feel safe to return home, Have you ever been  hit, kicked, punched or otherwise hurt by someone in the past year? No.    Illicit drugs: Denies.  Nonsmoker.    Lives with longstanding boyfriend.    ---      **Allergies**    ---      N.K.D.A.    ---    [name]]      **Hospitalization/Major Diagnostic Procedure**    ---      as above    ---    GI bleed 05/2019    ---      **Review of Systems**    ---     _ORT_ :    Eyes No. Ear, Nose Throat No. Digestion, Stomach, Bowel No. Bladder Problems  No. Bleeding Problems No. Numbness/Tingling No. Anxiety/Depression No.  Fever/Chills/Fatigue No. Chest Pain/Tightness/Palpitations No. Skin Rash No.  Dental Problems No. Joint/Muscle Pain/Cramps Yes. Blackout/Fainting No. Other  No.          **Reason for Appointment**    ---      1\. s/p left proximal hamstring repair DOS 04/07/19    ---    2\. Right trochanteric bursitis and IT band tendinitis    ---      **History of  Present Illness**    ---     _COVID SCREEN_ :    59 year old female returns for follow up now 5 months s/p left proximal  hamstring repair DOS 04/07/19. She reports no pain with the left leg. No issues  with the incision site. No pain with sitting. She continues with PT. Also  presents for reevaluation of right trochanteric bursitis and IT band  tendinitis now 3-4 months in duration. She was seen by Dr. [name] since our  last visit and under many diagnostic and therapuetic injections, including  trigger point and most recently at the IT band (09/02/19). IT band injection has  helped to improve her pain but still persistant. Pain is at the right lateral  hip and down the lateral leg ending before the knee. Denies radiating pain,  numbness or tingling. Worse with walking and feels it has effected her gait.  Dr. [name] obtained an MRI to rule our lumbar radiculopathy. She was also  referred to [facility] for further care. PMH includes scleroderma and  more recently diagnosed with Paget's disease. She is seeing her rheumatologist  Dr. Lavona Mound later this afternoon.      **Vital Signs**    ---    Pain scale 0, Ht-in 62, Ht-cm 157.48.      **Physical Examination**    ---    On examination today, the patient is a well-appearing, pleasant 59 year-old  female in no acute distress. She ambulates with an antalgic gait due to right  leg pain.    On examination of the Left lower extremity, there is a well-healed transverse  incision at the gluteal crease with no surrounding erythema, warmth,  fluctuance, or purulent discharge. No other signs of infection. No tenderness  at the incision site.    On exam of the Right lower extremity, skin intact, no swelling, warmth,  or  erythema. No signs of infection. She is tender over the trochanteric bursa and  along the IT band, no tenderness over IT band insertion at the knee.    Symmetric ROM at the hips bilaterally:No pain with flexion, internal or  external rotation. Negative SLR, log  roll, stintchfields. SILT in s/s/sp/dp/t  nerve distributions. Lower extremities are warm and well perfused with  palpable pedal pulses.      **Assessments**    ---    1\. Tear of left hamstring, subsequent encounter - S76.312D (Primary)    ---    2\. Trochanteric bursitis, right hip - M70.61    ---    3\. Iliotibial band syndrome of right side - M76.31    ---      **Treatment**    ---      **1\. Tear of left hamstring, subsequent encounter**    Notes: Patient was seen and evaluated with Dr. Leandro Reasoner. Wilmina is doing well  now 5 months from left hamstring repair. No signs of infection or pain related  to left hamstring. As for her low back pain, right trochanteric bursitis, and  right IT tendinitis, she has tried several modalities without signficant  relief. Dr. [name] has referred her to [facility] this may provide  further management for her. With her new diagnosis of Paget's disease in  combination of her multiple pain points, recommended referral to Dr. Dareen Piano  at the [facility]. This may provide a new outlook and opinion for  managing her symptoms. She will continue to follow up with [name] as  planned. As for our clinic, we will leave our follow up open ended. Answered  all questions and patient agrees with plan.    .    ---     **Follow Up**    ---    prn    **Appointment Provider:** Osa Craver, PA    Electronically signed by Chauncey Cruel , MD on 09/19/2019 at 06:23 PM EDT    Sign off status: Completed        * * *        [facility]    [address]    [place], [place] [address]    Tel: [phone]    Fax: [phone]              * * *          Progress Note: [employer] Mcjunkins WHITE, PA 09/12/2019    ---    Note generated by eClinicalWorks EMR/PM Software ([email or web address])

## 2019-09-12 NOTE — Progress Notes (Signed)
* * *      [name], [name] **DOB:** [birth year] (59 yo F) **Acc No.** [id number] **DOS:**  09/12/2019    ---       [name], [name]**    ------    47 Y old Female, DOB: 1960/10/02    [address], [place], [place] [address]    Home: [phone]    [name]        * * *    Telephone Encounter    ---    Answered by  Ignacia Palma Date: 09/12/2019       Time: 09:30 AM    Caller  patient    ------            Reason  Referral            Message                     [name] was referred to [facility] by Dr. [name] at her last visit and she said they were requesting for a referral and order to be sent over so they can continue with her care. They are requesting clinic notes and a diagnosis for [name]. They will need pt demographic info, insurance info, NPI/specilaty, date of service, etc. She said she called our clinic and left a message but did not hear back from anyone. [name] can be reached at [phone] and the information can be sent to the address/fax below.             [facility]      [address] [place], [place] [address]      NPI# [id number]      Fax: [phone]      Phone: [phone]            Thanks!                Action Taken                     [facility]  09/12/2019 9:42:06 AM >      [name],[name]  09/12/2019 10:02:15 AM > Patient called looking for an update on this CB# [phone]      [name]  09/12/2019 12:00:29 PM > Referral has to come from her PCP. I faxed her demographics and last clinic note from Dr. [name] to [facility]. I also called Jazzlin and LVM in regards to this.                    * * *                ---          * * *         ProviderGaynelle Adu, Xzavion Doswell 09/12/2019    ---    Note generated by eClinicalWorks EMR/PM Software ([email or web address])

## 2019-09-12 NOTE — Progress Notes (Signed)
 .  Progress Notes  .  Patient: [name], SCHOON [facility]  Provider: [name]  DOB:11/09/1960 Age: 59 Y Sex: Female  .  PCP: Ruffin Frederick    Date: 09/12/2019  .  --------------------------------------------------------------------------------  .  HISTORY OF PRESENT ILLNESS  .  GENERAL:  This patient was seen and evaluated with  [place] MS4, KG Kompa.Seifried is a 59 year old women with a PMH of  scleroderma, GI bleed, carpal tunnel, stage II liver fibrosis,  and ?Paget's who presents for follow-up..She started the CellCept  in April and feels that this has made a big difference in her  scleroderma symptoms. She noticed less hand stiffness and pain,  although she is still unable to fully straighten her fingers. She  does notice some cracking and dry skin at the tips of her fingers  when she gets Raynaud's for a prolonged period of time. She is  happy with the medication and does not notice any side effects.  She is doing so well she is able to take 10 mg prednisone every 4  days, which is a large decrease. Marland KitchenHer main concern today is her  back and leg pain, which prevents her from walking normally. She  torn her left hamstring a couple of months ago which was  surgically repaired. She has no pain on the side of the surgery.  She says that she has pain that radiated down from her back and  down her right leg. She has tried accupuncture and lidocaine  injections into her IT band which have helped somewhat but she is  still quite debilitated by the pain. She cannot lay flat on her  back for long periods of time without becoming "short of breath"  due to the pain. . In regards to her other medical issues, she is  concerned whether or not she should get a liver biopsy to confirm  her fibrosis. She was recently started on ursidiol given the CT  and MRI findings of fibrosis. In addition, the MRI results  mention a lesion on her left superior pubic ramus which was  consistent with Paget's disease based on imaging from 10  years  ago. However, there is not an MRI or CT from 10 years ago in our  system that mentions this finding. Mindie was confused by this  finding, and felt that this could not explain her recent back and  leg pain..ROS: Positive for hair loss, back pain, leg pain, joint  swelling.Denies dry mouth, mouth sores, skin changes, rashes, CP,  SOB. Marland KitchenRecent tests:08/11/19 CT A/PIMPRESSION: Heterogeneous left  superior pubic ramus 2.5 cm focus of sclerosis of uncertain  significance. Diffuse low-density of the liver most likely  represents hepatic steatosis. No focal liver lesion. 08/26/19 MRI  PelvisIMPRESSION: * The sclerotic lesion in the vicinity of the  left superior pubic ramus and pubic symphysis dating back 10  years is consistent with Paget's disease at this site. No further  imaging follow-up is necessary. * Degenerative changes in the  lower lumbar spine and both hips without change.  .  CURRENT MEDICATIONS  .  Taking Acetaminophen 500 MG Tablet 2 tablets as needed Orally  every 8 hrs, Notes: as needed  Taking Baclofen 10 MG Tablet 1/2 tab Orally prn, Notes: prn  Taking Calcium Carbonate-Vitamin D 600-200 MG-UNIT Capsule 1  capsule with a meal Orally Twice a day  Taking Famotidine 40 MG Tablet 1 tablet at bedtime Orally Once a  day, Notes: prn  Taking Gammagard -  Solution as directed Injection once every 4  weeks, Notes: every 4 weeks  Taking glipiZIDE ER 5 MG Tablet Extended Release 24 Hour TAKE 1  TABLET BY MOUTH EVERY DAY  Taking glipiZIDE XL 5 MG Tablet Extended Release 24 Hour 1 tablet  Orally Once a day  Taking Glucophage XR 500 mg Tablet Extended Release 24 Hour 4  tablets Orally Once a day  Taking Hydroxychloroquine Sulfate 200 MG Tablet 1 tablet with  food or milk Orally Once a day  Taking Jardiance 10 MG Tablet 1 tablet Orally Once a day  Taking Lidocaine 5 % Patch 1 patch remove after 12 hours  Externally Once a day, Notes: prn  Taking Lisinopril 2.5 MG Tablet 1 tablet Orally Once a day  Taking  Mycophenolate Mofetil 500 MG Tablet 2 tablet Orally twice  daily  Taking Omeprazole 40 mg Capsule Delayed Release 1 capsule Orally  Once a day  Taking oxyCODONE HCl 5 MG Tablet 1 tablet as needed Orally every  4-6 hrs  Taking predniSONE 10 mg Tablet 1 tablet Orally Once a day  Taking traMADol HCl 50 MG Tablet 1 tablet Orally every 6 hrs,  Notes: PRN - rarely takes  Taking Ursodiol 500 MG Tablet 1 tablet Orally Twice a day  Taking Vitamin B Complex - Tablet 1 tablet Orally once daily  Taking Vitamin D 25 MCG (1000 UT) Tablet 1 tablet Orally Once a  day  Not-Taking/PRN MiraLax 17 GM Packet 1 packet mixed with 8 ounces  of fluid Orally Once a day  Not-Taking/PRN Rituxan 500 MG/50ML Solution as directed  Intravenous , Notes: Last dose 05/19/19  Medication List reviewed and reconciled with the patient  .  PAST MEDICAL HISTORY  .  Scleroderma - CREST dx 2007  IgG4 deficiency s/p IVIG 2010 ----single infusion given  preventively after week of bilateral knee replacements at [facility]  2010  Fx left wrist in 1994  Blood clot at LUE in 2006 on a short course of Coumadin  Bilateral carpal tunnel syndrome s/p Lt carpal tunnel release and  steroids injection right--  Bone spur left foot  Scoliosis  Spinal stenosis s/p steroids injection  s/p bilateral knee replacements for valgus /arthritic  complications, performed by Dr. Katrinka Blazing 2010--- never infected, but  packed with antibiotics with surgery;  Right rotator cuff repairs x 4, complicated by repeat tears,  infection, placement of anchor material  Elevated liver function tests  Diabetes  Seronegative RA  GI bleed  GERD  Pagets disease  Stage II fibrosis of Liver  .  ALLERGIES  .  N.K.D.A.  .  SURGICAL HISTORY  .  Right rotator cuff repair, with infected hardware that had to be  removed 2006  Repeat right shoulder surgery, also which became infected. 2007  Left rotator cuff repair 2008  bilateral knee replacements 2009  Left arthroscopic carpal tunnel release 01/2011  ORIF Left 4th  metatarsal bone  Left Ulna shortening 1994  Knee replacement 2010  Hand surgery 2013, 2015  remove gallbladder/hernia 1990  Plate left foot 3rd metatarsal 2008  Left reverse TSA 03/07/16  Left Hamstring Repair 05/2019  .  FAMILY HISTORY  .  Mother: deceased 7 yrs, lung cancer, hyperthyroidism, diagnosed  with Other malignant neoplasm of unspecified site  Father: deceased 69s yrs, heart attack, Unspecified heart disease  Siblings: alive, Brother - scleroderma, COPD  3 brother(s) .  Denies family history of GI cancer or liver disease.  [name]  HOSPITALIZATION/MAJOR DIAGNOSTIC PROCEDURE  .  as  above  GI bleed 05/2019  .  REVIEW OF SYSTEMS  .  Rheumatology:  .  General    No fever, weight loss, swollen glands . Muscoskeletal     +Joint pain, +joint swelling, +morning stiffness  . Eyes    No  vision loss, eye dry, red eye, eye pain . Mouth    No mouth sores  . Cardiovascular    ? raynaud, no chest pain , irregular heart  beat, racing heart beat, leg swellingUpcoming Stress Test .  Pulmonary    No cough, cough blood, shortness of breath . Skin     No rash, photosensitivity . Lymphatics    No swollen glands,  tender glands .  [name]  VITAL SIGNS  .  Pain scale 6, Ht-in 62, Wt-lbs 155, BMI 28.35, BP 115/75, HR  9498%.  .  PHYSICAL EXAMINATION  .  GEN: Well-appearing women sitting comfortably, NADHEENT: no  scleral icterus, no conjunctiva injection, EOMICV: RRR, normal  S1, S2, no murmurs, rubs, or gallopsPulm: CTA bilaterally, no  wheezes, rales, or rhonchiSkin: Sclerodactyly present over  bilaterally hands, clubbing of fingernails present  bilaterallyMSK:Upper extremity: some mild joint swelling of the  PIPs and DIPs bilaterally, PIPs, DIPs, and MCPs mildly tender to  palpation, bilateral wrist stiffness and limited ROM, elbows and  shoulders without abnormalitiyLower extremity: knees without  tenderness or effusion, ankles without tenderness or effusionNeck  and Back: no point tenderness over the neck, no paraspinal  tenderness, very  tender over the bilateral buttocks areasGait:  walks with a limp.  .  ASSESSMENTS  .  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  Scleroderma - M34.9  .  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  Long-term use of high-risk medication - Z79.899  .  PFO (patent foramen ovale) - Q21.1  .  Regarding her inflammatory arthritis, Mackenzye seems to be doing  very well on CellCept after several weeks. She continues to get  IVIG infusions which are critical in terms of preventing  infections given her immunosuppression and CVID. She does still  have significant back and leg pain, which is inhibiting her  ability to walk. The recent diagnosis of possible Paget's and  stage II liver fibrosis are unlikely to cause this type of pain,  however the Paget's question warrants further investigation given  that we are unable to identify the study from 10 years ago used  for reference. We will order a bone scan today to determine if  there is Paget's occurring and if this could be in a different  area of her skeleton that would better explain her right sided  back and leg pain. On her CT, there was evidence of grade 1  anterolisthesis of L4 relative to L5. She should continue to stay  active and taper off of the prednisone as able. .From a GI and  cardiology standpoint she has been stable. She has no evidence of  ongoing bleeding. She continues to have elevated LFT's of unclear  etiology. Marland KitchenPLAN:- Whole body bone scan - Encourage physical  activity- Follow-up via telemedicine to discuss imaging results-  Continue CellCept, recent labs show no toxicity, repeat today  including some requested by GI.  [name]  TREATMENT  .  Seronegative rheumatoid arthritis  NM Bone Scan- Whole [id number]  .  [name]  Scleroderma  LAB: Iron (IRON)  .  LAB: Gamma Glutamyl Transferase (GGT)  Gamma Glutamyl Transferase (GGT)     1081     (0 -  59 - IU/L)  .  [name]  LAB: Ferritin (FER)  Ferritin     111     (10 - 240 - ng/mL)  .  [name]  LAB: Basic Metabolic  Panel (BMP)  Anion Gap     10     (3 - 14 - )  Calcium (Ca)     9.6     (8.5 - 10.5 - mg/dL)  Chloride (CL)     161     (98 - 110 - mEq/L)  CO2     21     (20 - 30 - mEq/L)  Creatinine (CR)     0.67     (0.57 - 1.30 - mg/dL)  Glucose     096     (70 - 139 - mg/dL)  Potassium (K)     4.3     (3.6 - 5.1 - mEq/L)  Sodium (NA)     136     (135 - 145 - mEq/L)  Blood Urea Nitrogen     21     (6 - 24 - mg/dL)  .  [name]  LAB: Hepatic Function/Liver Function (LFTP)  .  LAB: Iron/TIBC (IRONP)  Iron     64     (37 - 170 - ug/dL)  Total Iron Binding Capacity     384     (253 - 463 - ug/dL)  Transferrin     045     (181 - 331 - mg/dL)  % Iron Satutation     17     (15 - 40 - %)  .  [name]  LAB: PTT (PTT)  PTT     30.6     (25.7 - 35.7 - sec)  .  [name]  LAB: Manual Prothrombin Time  .  LAB: Immunoglobulin A IgA (IGA)  Immunoglobulin A     61     (70 - 360 - mg/dL)  .  [name]  LAB: Immunoglobulin G IgG (IGG)  Immunoglobulin G     640     (540 - 1822 - mg/dL)  .  [name]  LAB: Immunoglobulin M IgM (IGM)  Immunoglobulin M     297     (22 - 293 - mg/dL)  .  [name]  LAB: IgG Subcls (IGGSB)  IgG Subclass-1     275     (382-929 - mg/dL)  IgG Subclass-2     409     (241-700 - mg/dL)  IgG Subclass-3     13     (22-178 - mg/dL)  IgG Subclass-4     8.6     (4.0-86.0 - mg/dL)  IgG Subclass Total     610     (910-582-5607 - mg/dL)  .  [name]  LAB: CBC/DIFF with PLT (CBCWD)  WBC     10.7     (4.0 - 11.0 - K/uL)  RBC     4.65     (3.70 - 5.00 - M/uL)  HGB     13.6     (11.0 - 15.0 - g/dL)  HCT     81.1     (91.4 - 45.0 - %)  MCV     91.6     (80.0 - 98.0 - fL)  MCH     29.2     (26.0 - 34.0 - pg)  MCHC     31.9     (32.0 - 36.0 - g/dL)  RDW  15.0     (11.5 - 14.5 - %)  PLT     252     (150 - 400 - K/uL)  MPV     10.5     (9.1 - 11.7 - fL)  SEG NEUT     88     ( - %)  LYMPH     7     ( - %)  MONO     4     ( - %)  EOS     0     ( - %)  BASO     0     ( - %)  NEUT #     9.5     (1.5 - 7.5 - K/uL)  LYMPH #     0.8     (1.0 - 4.0 - K/uL)  MONO #     0.4     (0.2 - 0.8 - K/uL)  EOSIN  #     0.0     (0.0 - 0.5 - K/uL)  BASO #     0.0     (0.0 - 0.2 - K/uL)  Imm Grnas     0     ( - %)  NRBC     0     ( - %)  Imm Grans, Abs     0.0     (0.0 - 0.1 - K/uL)  NRBC, Abs     0.0     (<0.0 - K/uL)  .  FOLLOW UP  .  2 Months  .  Electronically signed by Erasmo Leventhal , MD on  09/17/2019 at 08:26 PM EDT  .  Document electronically signed by [name]

## 2019-09-12 NOTE — Progress Notes (Signed)
 .  Progress Notes  .  Patient: [name] [name]  Provider: Osa [name]  DOB:12-Jul-1960 Age: 59 Y Sex: Female  Supervising Provider:: [name] [name]  Date: 09/12/2019  .  PCP: [name] [name]    Date: 09/12/2019  .  --------------------------------------------------------------------------------  .  REASON FOR APPOINTMENT  .  1. s/p left proximal hamstring repair DOS 04/07/19  .  2. Right trochanteric bursitis and IT band tendinitis  .  HISTORY OF PRESENT ILLNESS  .  COVID SCREEN:   59 year old female returns for follow up now 5 months s/p left  proximal hamstring repair DOS 04/07/19. She reports no pain with  the left leg. No issues with the incision site. No pain with  sitting. She continues with PT. Also presents for reevaluation of  right trochanteric bursitis and IT band tendinitis now 3-4 months  in duration. She was seen by [name] [name] since our last visit and  under many diagnostic and therapuetic injections, including  trigger point and most recently at the IT band (09/02/19). IT band  injection has helped to improve her pain but still persistant.  Pain is at the right lateral hip and down the lateral leg ending  before the knee. Denies radiating pain, numbness or tingling.  Worse with walking and feels it has effected her gait. [name] [name]  obtained an MRI to rule our lumbar radiculopathy. She was also  referred to [facility] for further care. PMH includes  scleroderma and more recently diagnosed with Paget's disease. She  is seeing her rheumatologist [name] [name] later this afternoon.  .  CURRENT MEDICATIONS  .  Taking Acetaminophen 500 MG Tablet 2 tablets as needed Orally  every 8 hrs, Notes: as needed  Taking Baclofen 10 MG Tablet 1/2 tab Orally prn, Notes: prn  Taking Calcium Carbonate-Vitamin D 600-200 MG-UNIT Capsule 1  capsule with a meal Orally Twice a day  Taking Famotidine 40 MG Tablet 1 tablet at bedtime Orally Once a  day, Notes: prn  Taking Gammagard - Solution as directed Injection once  every 4  weeks, Notes: every 4 weeks  Taking glipiZIDE ER 5 MG Tablet Extended Release 24 Hour TAKE 1  TABLET BY MOUTH EVERY DAY  Taking glipiZIDE XL 5 MG Tablet Extended Release 24 Hour 1 tablet  Orally Once a day  Taking Glucophage XR 500 mg Tablet Extended Release 24 Hour 4  tablets Orally Once a day  Taking Hydroxychloroquine Sulfate 200 MG Tablet 1 tablet with  food or milk Orally Once a day  Taking Jardiance 10 MG Tablet 1 tablet Orally Once a day  Taking Lidocaine 5 % Patch 1 patch remove after 12 hours  Externally Once a day, Notes: prn  Taking Lisinopril 2.5 MG Tablet 1 tablet Orally Once a day  Taking MiraLax 17 GM Packet 1 packet mixed with 8 ounces of fluid  Orally Once a day  Taking Mycophenolate Mofetil 500 MG Tablet 2 tablet Orally twice  daily  Taking Omeprazole 40 mg Capsule Delayed Release 1 capsule Orally  Once a day  Taking oxyCODONE HCl 5 MG Tablet 1 tablet as needed Orally every  4-6 hrs  Taking predniSONE 10 mg Tablet 1 tablet Orally Once a day  Taking traMADol HCl 50 MG Tablet 1 tablet Orally every 6 hrs,  Notes: PRN - rarely takes  Taking Ursodiol 500 MG Tablet 1 tablet Orally Twice a day  Taking Vitamin B Complex - Tablet 1 tablet Orally once  daily  Taking Vitamin D 25 MCG (1000 UT) Tablet 1 tablet Orally Once a  day  Not-Taking/PRN Rituxan 500 MG/50ML Solution as directed  Intravenous , Notes: Last dose 05/19/19  Medication List reviewed and reconciled with the patient  .  PAST MEDICAL HISTORY  .  Scleroderma - CREST dx 2007  IgG4 deficiency s/p IVIG 2010 ----single infusion given  preventively after week of bilateral knee replacements at [facility]  2010  Fx left wrist in 1994  Blood clot at LUE in 2006 on a short course of Coumadin  Bilateral carpal tunnel syndrome s/p Lt carpal tunnel release and  steroids injection right--  Bone spur left foot  Scoliosis  Spinal stenosis s/p steroids injection  s/p bilateral knee replacements for valgus /arthritic  complications, performed by Dr. Katrinka Blazing 2010---  never infected, but  packed with antibiotics with surgery;  Right rotator cuff repairs x 4, complicated by repeat tears,  infection, placement of anchor material  Elevated liver function tests  Diabetes  Seronegative RA  GI bleed  GERD  .  ALLERGIES  .  N.K.D.A.  .  SURGICAL HISTORY  .  Right rotator cuff repair, with infected hardware that had to be  removed 2006  Repeat right shoulder surgery, also which became infected. 2007  Left rotator cuff repair 2008  bilateral knee replacements 2009  Left arthroscopic carpal tunnel release 01/2011  ORIF Left 4th metatarsal bone  Left Ulna shortening 1994  Knee replacement 2010  Hand surgery 2013, 2015  remove gallbladder/hernia 1990  Plate left foot 3rd metatarsal 2008  Left reverse TSA 03/07/16  Left Hamstring Repair 05/2019  .  FAMILY HISTORY  .  Mother: deceased 40 yrs, lung cancer, hyperthyroidism, diagnosed  with Other malignant neoplasm of unspecified site  Father: deceased 27s yrs, heart attack, Unspecified heart disease  Siblings: alive, Brother - scleroderma, COPD  3 brother(s) .  Denies family history of GI cancer or liver disease.  .  SOCIAL HISTORY  .  .  Tobacco  history: Never smoked.  .  Marital Status Single.  .  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  .  Alcohol Former daily EtOH use in 20s.  .  Abuse/NeglectDo you feel unsafe in your relationships?No ,  PlanResources Provided, Patient states they feel safe to return  home , Have you ever been hit, kicked, punched or otherwise hurt  by someone in the past year? No.  .  Illicit drugs: Denies.  .  Nonsmoker.Lives with longstanding boyfriend.  [name]  HOSPITALIZATION/MAJOR DIAGNOSTIC PROCEDURE  .  as above  GI bleed 05/2019  .  REVIEW OF SYSTEMS  .  ORT:  .  Eyes    No . Ear, Nose Throat    No . Digestion, Stomach, Bowel     No . Bladder Problems    No . Bleeding Problems    No .  Numbness/Tingling    No . Anxiety/Depression    No .  Fever/Chills/Fatigue    No . Chest Pain/Tightness/Palpitations     No .  Skin Rash    No . Dental Problems    No . Joint/Muscle  Pain/Cramps    Yes . Blackout/Fainting    No . Other    No .  .  VITAL SIGNS  .  Pain scale 0, Ht-in 62, Ht-cm 157.48.  [name]  PHYSICAL EXAMINATION  .  On examination today, the patient is a well-appearing, pleasant  59 year-old female in no acute distress. She ambulates  with an  antalgic gait due to right leg pain.On examination of the Left  lower extremity, there is a well-healed transverse incision at  the gluteal crease with no surrounding erythema, warmth,  fluctuance, or purulent discharge. No other signs of infection.  No tenderness at the incision site.On exam of the Right lower  extremity, skin intact, no swelling, warmth, or erythema. No  signs of infection. She is tender over the trochanteric bursa and  along the IT band, no tenderness over IT band insertion at the  knee. Symmetric ROM at the hips bilaterally:No pain with flexion,  internal or external rotation. Negative SLR, log roll,  stintchfields. SILT in s/s/sp/dp/t nerve distributions. Lower  extremities are warm and well perfused with palpable pedal  pulses.  .  ASSESSMENTS  .  Tear of left hamstring, subsequent encounter - S76.312D (Primary)  .  Trochanteric bursitis, right hip - M70.61  .  Iliotibial band syndrome of right side - M76.31  .  TREATMENT  .  Tear of left hamstring, subsequent encounter  Notes: Patient was seen and evaluated with Dr. Leandro Reasoner. [name] [name] is  doing well now 5 months from left hamstring repair. No signs of  infection or pain related to left hamstring. As for her low back  pain, right trochanteric bursitis, and right IT tendinitis, she  has tried several modalities without signficant relief. [name] [name]  has referred her to [facility] this may provide further  management for her. With her new diagnosis of Paget's disease in  combination of her multiple pain points, recommended referral to  Dr. Dareen Piano at the [facility]. This may provide a new  outlook and  opinion for managing her symptoms. She will continue  to follow up with [name] as planned. As for our clinic, we  will leave our follow up open ended. Answered all questions and  patient agrees with plan.  .  .  FOLLOW UP  .  prn  .  [name]  Appointment Provider: Osa Craver, PA  .  Electronically signed by Chauncey Cruel , MD on  09/19/2019 at 06:23 PM EDT  .  CONFIRMATORY SIGN OFF  SALZLER,MATTHEW J 09/19/2019 6:23:13 PM > I personally interviewed and examined the patient and both the PA and I contributed to this electronic note. I agree with the history, exam, assessment and plan as detailed in this note and edited it as necessary.  .  Document electronically signed by WHITE, COURTNEY    .

## 2019-09-15 ENCOUNTER — Ambulatory Visit

## 2019-09-15 ENCOUNTER — Ambulatory Visit: Admitting: Rheumatology

## 2019-09-15 NOTE — Progress Notes (Signed)
* * *      [name], [name] **DOB:** [birth year] (59 yo F) **Acc No.** [id number] **DOS:**  09/15/2019    ---       [name], [name]**    ------    [name], DOB: [birth year]    [address], [place], [place] [address]    Home: [phone]    Provider: [name]        * * *    Telephone Encounter    ---    Answered by  Arva Chafe Date: 09/15/2019       Time: 02:12 PM    Reason  Med leave    ------            Message                     Pt would like for [place] to follow up with the insurance company and fax over the note for the medical leave. Call back if anything. Was also wondering about future follow up. When or after scan?                Action Taken                     [facility]  09/16/2019 10:41:43 AM > Spoke with [facility] and they are looking for most recent notes and medical order for referral.  Both of which I faxed.  Now I will update [employer] and fax.      [name],[name]  09/16/2019 1:38:41 PM > IgG levels sent to insurance company so they can send in new PA.  Spoke with cindy and she will call me if she needs anything.      [name],[name]  09/22/2019 1:08:12 PM > Pt has been having stomach upset.  She is caling GI to make appt with them and will keep [place] updated.  I faxed her long term diasbility to [employer].                    * * *                ---          * * *         Provider: [name] 09/15/2019    ---    Note generated by eClinicalWorks EMR/PM Software ([email or web address])

## 2019-09-16 LAB — HX IMMUNOLOGY
HX IGG SUBCLASS TOTAL: 610 mg/dL
HX IGG SUBCLASS-1: 275 mg/dL — ABNORMAL LOW
HX IGG SUBCLASS-2: 199 mg/dL — ABNORMAL LOW
HX IGG SUBCLASS-3: 13 mg/dL — ABNORMAL LOW
HX IGG SUBCLASS-4: 8.6 mg/dL

## 2019-09-17 ENCOUNTER — Ambulatory Visit: Admitting: Rheumatology

## 2019-09-17 ENCOUNTER — Ambulatory Visit

## 2019-09-17 MED ORDER — GABAPENTIN: 1 | 30 | 1 refills | 0 days | Status: AC

## 2019-09-17 NOTE — Telephone Encounter (Signed)
 Medications:  GABAPENTIN 300MG  CAPSULES (GABAPENTIN) TAKE 1 CAPSULE BY MOUTH EVERY NIGHT AT BEDTIME AS NEEDED FOR PAIN  #30[Capsule] x 1   Entered by: Kandis Fantasia, RN   Authorized by: [name] -JN 6A-   Signed by: Kandis Fantasia, RN on 09/17/2019   Method used: [facility]ically [facility]      [facility] [place] [facility] [address] (retail)     [address]     [place], [place]       Ph: [phone]     Fax: [phone]   RxID: [id number]        Call Details:   Patient PCP = [name] -JN 6A-  [name] (Patient) called on September 17, 2019 11:37 AM.  Message taken by: [name]  Primary call-back number: [phone]    Secondary call-back number: () -    Call Reason(s): Prescription        ** REQUESTING A PRESCRIPTION OR REFILL.  [id number]  [address]   [place]  [place]  Phone: [phone]  Fax:     Provide Prescription to Pharmacy  Medications to refill:  Gabapentin 300mg  capsules : TAKE 1 CAPSULE BY MOUTH EVERY NIGHT AT BEDTIME AS NEEDED FOR PAIN    ---------- ---------- ---------- ---------- ---------- ----------       RESPONSE/ORDERS:  [Prescriptions]  <no value>                 ORDERS/PROBS/MEDS/ALL     Problems:   BONE LESION - PELVIS (ICD-733.90) (ICD10-M89.9)  GANGLION (ICD-727.43) (ICD10-M67.40)  INTERNAL HEMORRHOID (ICD-455.0) (ICD10-K64.8)  DIVERTICULOSIS, COLON (ICD-562.10) (ICD10-K57.30)  EROSIVE GASTRITIS (ICD-535.40) (ICD10-K29.60)  MUSCLE RUPTURE, NONTRAUMATIC (ICD-728.83) (ICD10-M62.10)  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-Q21.1)  SCREENING EXAM FOR BREAST CANCER (ICD-V76.10) (ICD10-Z12.39)  ANEMIA, OTHER (ICD-285.8) (ICD10-D64.89)  LOWER EXTREMITY EDEMA (ICD-782.3) (ICD10-R60.0)  IRREGULAR HEART BEATS (ICD-427.9) (ICD10-I49.9)  GI BLEED (ICD-578.9) (LPF79-K24.2)  SCLERODERMA (ICD-710.1) (ICD10-M34.9)  SCREENING FOR CERVICAL CANCER (ICD-V76.2) (OXB35-H29.4)  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) (ICD10-M47.812)  NECK PAIN (ICD-723.1)  (ICD10-M54.2)  STEROID USE, LONG TERM (ICD-V58.65) (ICD10-Z79.52)  PREMATURE VENTRICULAR CONTRACTIONS (ICD-427.69) (ICD10-I49.3)  HYPERLIPIDEMIA (ICD-272.4) (ICD10-E78.5)  BACK PAIN, LUMBAR, CHRONIC (ICD-724.2) (ICD10-M54.5)  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)  DIABETES MELLITUS, TYPE II (ICD-250.00) (ICD10-E11.9)  LONG-TERM (CURRENT) USE OF STEROIDS (ICD-V58.65) (ICD10-Z79.51)  SKIN SAGGING DUE TO WEIGHT LOSS (ICD-757.9) (ICD10-Q84.9)  TRANSAMINASES, SERUM, ELEVATED (ICD-790.4) (ICD10-R74.0)  OBESITY, BMI 30-34.9, ADULT (ICD-278.00) (ICD10-E66.9)      DIABETES MELLITUS, TYPE II, UNCONTROLLED (ICD-250.02) (ICD10-E11.65)      FATTY LIVER DISEASE (ICD-571.8) (ICD10-K76.0)  SCLERODERMA, 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)  HYPOTHYROIDISM (ICD-244.9) (ICD10-E03.9)  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) (ICD10-D80.8)  SPINAL STENOSIS, 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)  HEPATITIS C EXPOSURE (HCV RNA NEGATIVE, 01/2014) (ICD-V02.62) (ICD10-Z20.5)  PAIN IN JOINT, HAND (ICD-719.44) (ICD10-M79.643)  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)  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)  CARPAL TUNNEL (ICD-354.0) (ICD10-G56.00)  OSTEOARTHRITIS (ICD-715.09) (ICD10-M15.9)  S/P ROTATOR CUFF SURGERY 12/2004 - RT SHOULDER; 2008 LT (ICD-V45.89)  Family Hx of MELANOMA, FAMILY HX (ICD-V16.8) (ICD10-Z80.8)  FRACTURE OF OTHER SPEC SITE,  PATHOLOGIC - MULTIPLE (ICD-733.19)  CHOLECYSTECTOMY AND HERNIA REPAIR (ICD-V45.89)  VITAMIN D DEFICIENCY (ICD-268.9) (ICD10-E55.9)  COLONOSCOPY, NEXT 2020- SEE COMMENT (ICD-V76.51) (ICD10-Z01.89)    Meds (prior to this call):   HYDROXYCHLOROQUINE SULFATE 200 MG ORAL TABLET (HYDROXYCHLOROQUINE SULFATE) one tablet oral daily; Route: ORAL  JARDIANCE 10 MG ORAL TABLET (EMPAGLIFLOZIN) Take 1 tablet by mouth once daily; Route: ORAL  ROSUVASTATIN  10MG  TABLETS (ROSUVASTATIN CALCIUM) TAKE 1 TABLET BY MOUTH EVERY DAY  LISINOPRIL 2.5MG  TABLETS (LISINOPRIL) TAKE 1 TABLET BY MOUTH  ONCE DAILY  METFORMIN HCL ER 500 MG XR24H-TAB (METFORMIN HCL) TAKE 4 TABLETS BY MOUTH EVERY MORNING  GLIPIZIDE ER 2.5 MG XR24H-TAB (GLIPIZIDE) TAKE 1 TABLET BY MOUTH DAILY  OMEPRAZOLE 20 MG ORAL CAPSULE DELAYED RELEASE (OMEPRAZOLE) Take one capsule by mouth twice a day; Route: ORAL  BACLOFEN 10MG  TABLETS (BACLOFEN) TAKE 1/2 TABLET BY MOUTH EVERY DAY AS NEEDED FOR BACK OR SPASMS  DICLOFENAC SODIUM 1 % TRANSDERMAL GEL (DICLOFENAC SODIUM) APP 2 GRAMS EXT AA BID      FREESTYLE FREEDOM LITE w/Device KIT (BLOOD GLUCOSE MONITORING SUPPL) use as directed (ICD 10 E11.65)      FREESTYLE LITE BLOOD GLUCOSE STRIPS (GLUCOSE BLOOD) USE TO CHECK BLOOD GLUCOSE THREE TIMES DAILY      FREESTYLE LANCETS (LANCETS) check fingerstick three times a day (ICD 10 E11.65)  TRAMADOL HCL 50 MG ORAL TABLET (TRAMADOL HCL) take one tablet daily as needed for pain; Route: ORAL  OXYCODONE HCL 5 MG ORAL TABLET (OXYCODONE HCL) Take one tablet daily as needed for pain >8. Partial fill upon patient request.; Route: ORAL  CALCIUM CARBONATE-VITAMIN D 600-200 MG-UNIT ORAL TABLET (CALCIUM CARBONATE-VITAMIN D) take one tablet twice a day; Route: ORAL  GABAPENTIN 300MG  CAPSULES (GABAPENTIN) TAKE 1 CAPSULE BY MOUTH EVERY NIGHT AT BEDTIME AS NEEDED FOR PAIN  * BLOODWORK Please draw chemistry (sodium, potassium, magnesium, chloride, bicarbonate, glucose, creatinine, BUN), CBC, TSH, lipids, HbA1c. Fax result to [phone] Dr. Marlane Hatcher  PREDNISONE 10 MG ORAL TABLET (PREDNISONE) Take 3 tablets once a day; Route: ORAL  FAMOTIDINE 20 MG TABLET (FAMOTIDINE) TAKE 1 TABLET BY MOUTH AT NIGHT AS NEEDED  LIDODERM 5 % EXTERNAL PATCH (LIDOCAINE) Apply one patch to back once daily  as needed for pain, Remove after 12 hrs; Route: EXTERNAL  MYCOPHENOLATE MOFETIL 500 MG ORAL TABLET (MYCOPHENOLATE MOFETIL) Take 2 tablets twice daily; Route: ORAL    Changes to Meds (this update):   Rx of GABAPENTIN 300MG  CAPSULES (GABAPENTIN) TAKE 1 CAPSULE BY MOUTH EVERY NIGHT AT BEDTIME AS NEEDED  FOR PAIN;  #30[Capsule] x 1;  Signed;  Entered by: Kandis Fantasia, RN;  Authorized by: [name] -JN 6A-;  Method used: [facility] [place] [facility] [address], [place], [place]  , Ph: [phone], Fax: [phone]          Created By [name] on 09/17/2019 at 11:37 AM    Electronically Signed By Karlyn Agee RN on 09/17/2019 at 11:44 AM

## 2019-09-17 NOTE — Progress Notes (Signed)
* * *      [name], [name] **DOB:** 09/13/1960 (59 yo F) **Acc No.** [id number] **DOS:**  09/17/2019    ---       [name], [name]**    ------    [name], DOB: 05/24/1960    [address], [place], [place] [address]    Home: [phone]    Provider: [name]        * * *    Telephone Encounter    ---    Answered by  Lindley Magnus Date: 09/17/2019       Time: 04:23 PM    Reason  *Other_Pharmacy [facility]    ------            Message                     [facility] faxed requesting documentation for Gammagard PA renewal. Most recent labs and notes from 4/16 are sufficient. Sent to [employer].                Action Taken                     Lindley Magnus, [name] 09/17/2019 4:25:29 PM >                     * * *                ---          * * *         Provider: [name] 09/17/2019    ---    Note generated by eClinicalWorks EMR/PM Software ([email or web address])

## 2019-09-18 ENCOUNTER — Ambulatory Visit: Admitting: Adult Health

## 2019-09-18 MED ORDER — Lisinopril: 2.5 | 90 | Freq: Every day | 0 refills | 0 days | Status: AC

## 2019-09-22 ENCOUNTER — Ambulatory Visit

## 2019-09-22 ENCOUNTER — Ambulatory Visit: Admit: 2019-09-22

## 2019-09-29 ENCOUNTER — Ambulatory Visit (HOSPITAL_BASED_OUTPATIENT_CLINIC_OR_DEPARTMENT_OTHER): Admitting: Adult Health

## 2019-09-30 ENCOUNTER — Ambulatory Visit: Admitting: Student in an Organized Health Care Education/Training Program

## 2019-09-30 NOTE — Progress Notes (Signed)
* * *      [name], [name] **DOB:** 07/06/1960 (59 yo F) **Acc No.** [id number] **DOS:**  09/30/2019    ---       [name], [name]**    ------    65 Y old Female, DOB: 1961-03-25    [address], [place], [place] [address]    Home: [phone]    Provider: Doristine Mango        * * *    Telephone Encounter    ---    Answered by  Hessie Dibble Date: 09/30/2019       Time: 03:27 PM    Reason  appt. reminder 7/1    ------            Message                     spoke - yes.                    * * *                ---          * * *         Provider: Doristine Mango 09/30/2019    ---    Note generated by eClinicalWorks EMR/PM Software ([email or web address])

## 2019-10-01 ENCOUNTER — Ambulatory Visit: Admitting: Adult Health

## 2019-10-01 MED ORDER — Jardiance: 10 | 90 | Freq: Every day | 0 refills | 0 days | Status: AC

## 2019-10-02 ENCOUNTER — Ambulatory Visit: Admitting: Rheumatology

## 2019-10-02 ENCOUNTER — Ambulatory Visit

## 2019-10-02 ENCOUNTER — Ambulatory Visit: Admitting: Student in an Organized Health Care Education/Training Program

## 2019-10-02 ENCOUNTER — Ambulatory Visit: Admit: 2019-10-02

## 2019-10-02 NOTE — Progress Notes (Signed)
* * *      CALIOPE, RUPPERT [name] **DOB:** [birth year] (59 yo F) **Acc No.** [id number] **DOS:**  10/02/2019    ---       [name], Jaskiran [name]**    ------    89 Y old Female, DOB: 11-08-1960    [address], [place], [place] [address]    Home: [phone]    Provider: [name]        * * *    Telephone Encounter    ---    Answered by  Leotis Shames Date: 10/02/2019       Time: 03:28 PM    Caller  [facility]    ------            Reason  MRI pelvis report and CD needed            Message                     Report needed today. CD needed later.            Fax: [phone] Attn: Dr. Cecile Sheerer.                 Action Taken                     CHUI,TRAVIS  10/02/2019 3:29:40 PM > admin, I don't know how to send out the report from [facility]. Can you help to send the MRI pelvis from 08/2019 to the above fax Today and then also send a CD of the MRI to them after?      Ye,[name]  10/02/2019 3:33:08 PM > faxing report. they'll need to contact the radiology dept for the cd.                    * * *                ---          * * *         Provider: [name] 10/02/2019    ---    Note generated by eClinicalWorks EMR/PM Software ([email or web address])

## 2019-10-02 NOTE — Progress Notes (Signed)
 .  Progress Notes  .  Patient: [name], GULLICKSON [facility]  Provider: Doristine Mango    .  DOB:[birth year] Age: 59 Y Sex: Female  .  PCP: Ruffin Frederick    Date: 10/02/2019  .  --------------------------------------------------------------------------------  .  REASON FOR APPOINTMENT  .  1. Follow up - GI bleed  .  HISTORY OF PRESENT ILLNESS  .  Ambulatory Falls and Injury Prevention:  HPI  .  Have you experienced a fall in the past year?No , Is the patient  using assistive devices such as a cane or walker?No , Do you need  assistance with ambulation while at our facility?No ,  Interventionsnone, patient not a fall risk , ensured environment  free from obstacles and obstructions  .  [name]  GENERAL:  Mrs. Castellanos is a woman with history of  scleroderma (CREST), IgG4 deficiency, RA, on chronic steroids  with prior treatment with Rituxan, and GI bleed in the setting of  diverticulosis, who presents for follow up.Patient was admitted  in 05/2018 to local hospital after episodes of melena. She was  found to be anemic and underwent EGD and colonoscopy which showed  a few gastric erosions and diverticulosis but no active bleeding.  No recurrence of bleeding since.Recently established care with  hepatology due to chronically elevated LFTs. Liver biopsy was  suggested but patient resistant. Was started on ursodiol. She has  no liver symptoms.Has been weaning off prednisone and started on  mycophenolate. Over the past month has had 3 episodes of  abdominal pain associated with nausea and early satiety. No clear  triggers. Only common food item has been eating eggs but she has  been an avid egg eater for years without issue. No change in  weight recently but previously lost a lot of weight  intentionally. She remains Omeprazole 40mg  daily which she  reports consistent use. Has pending bone scan for sclerotic bone  lesion.  .  CURRENT MEDICATIONS  .  Taking Acetaminophen 500 MG Tablet 2 tablets as needed Orally  every 8 hrs, Notes: as  needed  Taking Baclofen 10 MG Tablet 1/2 tab Orally prn, Notes: prn  Taking Calcium Carbonate-Vitamin D 600-200 MG-UNIT Capsule 1  capsule with a meal Orally Twice a day  Taking Famotidine 40 MG Tablet 1 tablet at bedtime Orally Once a  day, Notes: prn  Taking Gammagard - Solution as directed Injection once every 4  weeks, Notes: every 4 weeks  Taking glipiZIDE ER 5 MG Tablet Extended Release 24 Hour TAKE 1  TABLET BY MOUTH EVERY DAY  Taking glipiZIDE XL 5 MG Tablet Extended Release 24 Hour 1 tablet  Orally Once a day  Taking Glucophage XR 500 mg Tablet Extended Release 24 Hour 4  tablets Orally Once a day  Taking Hydroxychloroquine Sulfate 200 MG Tablet 1 tablet with  food or milk Orally Once a day  Taking Jardiance 10 MG Tablet 1 tablet Orally Once a day  Taking Lidocaine 5 % Patch 1 patch remove after 12 hours  Externally Once a day, Notes: prn  Taking Lisinopril 2.5 MG Tablet 1 tablet Orally Once a day  Taking metFORMIN HCl ER 500 MG Tablet Extended Release 24 Hour  TAKE 4 TABLETS BY MOUTH EVERY DAY  Taking Mycophenolate Mofetil 500 MG Tablet 2 tablet Orally twice  daily  Taking Omeprazole 40 mg Capsule Delayed Release 1 capsule Orally  Once a day  Taking oxyCODONE HCl 5 MG Tablet 1 tablet as needed Orally every  4-6 hrs  Taking predniSONE 10 mg Tablet 1 tablet Orally Once a day  Taking traMADol HCl 50 MG Tablet 1 tablet Orally every 6 hrs,  Notes: PRN - rarely takes  Taking Ursodiol 500 MG Tablet 1 tablet Orally Twice a day  Taking Vitamin B Complex - Tablet 1 tablet Orally once daily  Taking Vitamin D 25 MCG (1000 UT) Tablet 1 tablet Orally Once a  day  Not-Taking/PRN MiraLax 17 GM Packet 1 packet mixed with 8 ounces  of fluid Orally Once a day  Not-Taking/PRN Rituxan 500 MG/50ML Solution as directed  Intravenous , Notes: Last dose 05/19/19  Medication List reviewed and reconciled with the patient  .  PAST MEDICAL HISTORY  .  Scleroderma - CREST dx 2007  IgG4 deficiency s/p IVIG 2010 ----single infusion  given  preventively after week of bilateral knee replacements at [facility]  2010  Fx left wrist in 1994  Blood clot at LUE in 2006 on a short course of Coumadin  Bilateral carpal tunnel syndrome s/p Lt carpal tunnel release and  steroids injection right--  Bone spur left foot  Scoliosis  Spinal stenosis s/p steroids injection  s/p bilateral knee replacements for valgus /arthritic  complications, performed by Dr. Katrinka Blazing 2010--- never infected, but  packed with antibiotics with surgery;  Right rotator cuff repairs x 4, complicated by repeat tears,  infection, placement of anchor material  Elevated liver function tests  Diabetes  Seronegative RA  GI bleed  GERD  Pagets disease  Stage II fibrosis of Liver  .  ALLERGIES  .  N.K.D.A.  .  SURGICAL HISTORY  .  Right rotator cuff repair, with infected hardware that had to be  removed 2006  Repeat right shoulder surgery, also which became infected. 2007  Left rotator cuff repair 2008  bilateral knee replacements 2009  Left arthroscopic carpal tunnel release 01/2011  ORIF Left 4th metatarsal bone  Left Ulna shortening 1994  Knee replacement 2010  Hand surgery 2013, 2015  remove gallbladder/hernia 1990  Plate left foot 3rd metatarsal 2008  Left reverse TSA 03/07/16  Left Hamstring Repair 05/2019  .  FAMILY HISTORY  .  Mother: deceased 95 yrs, lung cancer, hyperthyroidism, diagnosed  with Other malignant neoplasm of unspecified site  Father: deceased 73s yrs, heart attack, Unspecified heart disease  Siblings: alive, Brother - scleroderma, COPD  3 brother(s) .  Denies family history of GI cancer or liver disease.  .  SOCIAL HISTORY  .  .  Tobacco  history: Never smoked.  .  Marital Status Single.  .  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  .  Alcohol Former daily EtOH use in 20s.  .  Abuse/NeglectDo you feel unsafe in your relationships?No ,  PlanResources Provided, Patient states they feel safe to return  home , Have you ever been hit, kicked, punched or otherwise hurt  by  someone in the past year? No.  .  Illicit drugs: Denies.  [name]  HOSPITALIZATION/MAJOR DIAGNOSTIC PROCEDURE  .  as above  GI bleed 05/2019  .  REVIEW OF SYSTEMS  .  10 pt review notable for HPI.  [name]  VITAL SIGNS  .  Pain scale 0, Ht-in 62, Wt-lbs 156, BMI 28.53, BP 129/78, HR 95,  BSA 1.76, O2 100%RA, Ht-cm 157.48, Wt-kg 70.76, Wt Change 3.2 lb.  .  PHYSICAL EXAMINATION  .  GENERAL:  General Appearance:  Well developed, well hydrated, no acute  distress. Mood/Affect:  Mood and affect within normal limits.[name]  HEENT  moist oral mucosa, neck supple. Neck  No carotid bruits,  supple. Skin  Normal, there is no rash or other skin lesions  noted. Cardiovascular  regular rate and rhythm, normal S1 S2, no  murmurs, extremities warm without swelling. Lungs  Clear to  auscultation; no wheezes, rhonchi or rales. Abdomen  Soft,  nontender, nondistended, no organmegaly, normoactive bowel  sounds, no peritoneal signs. Extremities  warm and well perfused,  healed scars on bilateral knees. Neuro  alert and oriented, exam  grossly intact. Digital rectal exam  Deferred.  .  ASSESSMENTS  .  Upper GI bleeding - K92.2 (Primary)  .  Scleroderma - M34.9  .  Abnormal LFTs - R79.89  .  Diverticulosis - K57.90  .  Gastric erosion determined by endoscopy - K25.9  .  Nausea - R11.0  .  Mrs. Werntz presents today for follow up of prior GI bleed. Review  of records confirmed only gastric erosions and diverticulosis on  EGD and colonoscopy. Suspect prior bleeding was due to  diverticular disease. No recurrance and Hgb normalized so no  indication for repeat at this time. She does report intermittent  nausea and early satiety. In the context of known diabetes and  few medications with antocholinergic properties delayed gastric  emptying is a possibility. Prednisone related gastritis also  possible. Will get a gastric emptying study. In the meantime  would continue antiacid therapy.She will continue follow up with  hepatology. I will contact her to discuss  results of gastric  emptying study.  .  TREATMENT  .  Nausea  Gastric Emptying [id number]  .  FOLLOW UP  .  4 Months  .  Electronically signed by Doristine Mango , MD on  10/02/2019 at 09:04 PM EDT  .  Document electronically signed by Doristine Mango    .

## 2019-10-02 NOTE — Progress Notes (Signed)
 [name] [name], [name] [name] **DOB:** [birth year] (59 yo F) **Acc No.** [id number] **DOS:**  10/02/2019    ---       [name] [name], [name] [name]**    ------    59 Y old Female, DOB: 29-Jan-1961, External MRN: [id number]    Account Number: [id number]    [address], [place], [id number]    Home: [phone]    Guarantor: [name] [name] Insurance: [id number] [employer] PPO    PCP: [name] [name] Referring: [name] [name] External Visit ID: [id number]    [facility]ointment Facility[facility]        * * *    10/02/2019 Progress Notes: [name] MangoCHN#:** [id number]    ------    ---       **Current Medications**    ---    Taking    * Acetaminophen 500 MG Tablet 2 tablets as needed Orally every 8 hrs, Notes: as needed    ---    * Baclofen 10 MG Tablet 1/2 tab Orally prn, Notes: prn    ---    * Calcium Carbonate-Vitamin D 600-200 MG-UNIT Capsule 1 capsule with a meal Orally Twice a day    ---    * Famotidine 40 MG Tablet 1 tablet at bedtime Orally Once a day, Notes: prn    ---    * Gammagard - Solution as directed Injection once every 4 weeks, Notes: every 4 weeks    ---    * glipiZIDE ER 5 MG Tablet Extended Release 24 Hour TAKE 1 TABLET BY MOUTH EVERY DAY     ---    * glipiZIDE XL 5 MG Tablet Extended Release 24 Hour 1 tablet Orally Once a day    ---    * Glucophage XR 500 mg Tablet Extended Release 24 Hour 4 tablets Orally Once a day    ---    * Hydroxychloroquine Sulfate 200 MG Tablet 1 tablet with food or milk Orally Once a day    ---    * Jardiance 10 MG Tablet 1 tablet Orally Once a day    ---    * Lidocaine 5 % Patch 1 patch remove after 12 hours Externally Once a day, Notes: prn    ---    * Lisinopril 2.5 MG Tablet 1 tablet Orally Once a day    ---    * metFORMIN HCl ER 500 MG Tablet Extended Release 24 Hour TAKE 4 TABLETS BY MOUTH EVERY DAY     ---    * Mycophenolate Mofetil 500 MG Tablet 2 tablet Orally twice daily    ---    * Omeprazole 40 mg Capsule Delayed Release 1 capsule Orally Once a day    ---    * oxyCODONE HCl 5 MG Tablet 1  tablet as needed Orally every 4-6 hrs    ---    * predniSONE 10 mg Tablet 1 tablet Orally Once a day    ---    * traMADol HCl 50 MG Tablet 1 tablet Orally every 6 hrs, Notes: PRN - rarely takes    ---    * Ursodiol 500 MG Tablet 1 tablet Orally Twice a day    ---    * Vitamin B Complex - Tablet 1 tablet Orally once daily    ---    * Vitamin D 25 MCG (1000 UT) Tablet 1 tablet Orally Once a day    ---    Not-Taking/PRN    * MiraLax  17 GM Packet 1 packet mixed with 8 ounces of fluid Orally Once a day    ---    * Rituxan 500 MG/50ML Solution as directed Intravenous , Notes: Last dose 05/19/19    ---    * Medication List reviewed and reconciled with the patient    ---     Past Medical History    ---      Scleroderma - CREST dx 2007.        ---    IgG4 deficiency s/p IVIG 2010 ----single infusion given preventively after  week of bilateral knee replacements at [facility] 2010.        ---    Fx left wrist in 1994.        ---    Blood clot at LUE in 2006 on a short course of Coumadin.        ---    Bilateral carpal tunnel syndrome s/p Lt carpal tunnel release and steroids  injection right--.        ---    Bone spur left foot.        ---    Scoliosis.        ---    Spinal stenosis s/p steroids injection.        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 Katrinka Blazing 2010--- never infected, but packed with antibiotics with  surgery;.        ---    Right rotator cuff repairs x 4, complicated by repeat tears, infection,  placement of anchor material.        ---    Elevated liver function tests.        ---    Diabetes.        ---    Seronegative RA.        ---    GI bleed .        ---    GERD.        ---    Pagets disease.        ---    Stage II fibrosis of Liver.        ---      **Surgical History**    ---      Right rotator cuff repair, with infected hardware that had to be removed  2006    ---    Repeat right shoulder surgery, also which became infected. 2007    ---    Left rotator cuff  repair 2008    ---    bilateral knee replacements 2009    ---    Left arthroscopic carpal tunnel release 01/2011    ---    ORIF Left 4th metatarsal bone    ---    Left Ulna shortening 1994    ---    Knee replacement 2010    ---    Hand surgery 2013, 2015    ---    remove gallbladder/hernia 1990    ---    Plate left foot 3rd metatarsal 2008    ---    Left reverse TSA 03/07/16    ---    Left Hamstring Repair 05/2019    ---      **Family History**    ---      Mother: deceased 38 yrs, lung cancer, hyperthyroidism, diagnosed with Other  malignant neoplasm of unspecified site    ---    Father: deceased 62s yrs, heart attack, Unspecified heart  disease    ---    Siblings: alive, Brother - scleroderma, COPD    ---    3 brother(s) .    ---    Denies family history of GI cancer or liver disease.    ---      **Social History**    ---    Tobacco history: Never smoked.    Marital Status  Single.    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    Alcohol  Former daily EtOH use in 20s.    Abuse/Neglect Do you feel unsafe in your relationships? No, Plan Resources  Provided, Patient states they feel safe to return home, Have you ever been  hit, kicked, punched or otherwise hurt by someone in the past year? No.    Illicit drugs: Denies.     **Allergies**    ---      N.K.D.A.    ---    [name] [name] Verified]      **Hospitalization/Major Diagnostic Procedure**    ---      as above    ---    GI bleed 05/2019    ---      **Review of Systems**    ---    10 pt review notable for HPI.       **Reason for Appointment**    ---      1\. Follow up - GI bleed    ---      **History of Present Illness**    ---     _Ambulatory Falls and Injury Prevention_ :    HPI Have you experienced a fall in the past year? No, Is the patient using  assistive devices such as a cane or walker? No, Do you need assistance with  ambulation while at our facility? No, Interventions none, patient not a fall  risk , ensured environment free from obstacles and  obstructions .    _GENERAL_ :    [name] [name] is a woman with history of scleroderma (CREST), IgG4 deficiency,  RA, on chronic steroids with prior treatment with Rituxan, and GI bleed in the  setting of diverticulosis, who presents for follow up.    Patient was admitted in 05/2018 to local hospital after episodes of melena. She  was found to be anemic and underwent EGD and colonoscopy which showed a few  gastric erosions and diverticulosis but no active bleeding. No recurrence of  bleeding since.    Recently established care with hepatology due to chronically elevated LFTs.  Liver biopsy was suggested but patient resistant. Was started on ursodiol. She  has no liver symptoms.    Has been weaning off prednisone and started on mycophenolate. Over the past  month has had 3 episodes of abdominal pain associated with nausea and early  satiety. No clear triggers. Only common food item has been eating eggs but she  has been an avid egg eater for years without issue. No change in weight  recently but previously lost a lot of weight intentionally. She remains  Omeprazole 40mg  daily which she reports consistent use.    Has pending bone scan for sclerotic bone lesion.      **Vital Signs**    ---    Pain scale 0, Ht-in 62, Wt-lbs 156, BMI 28.53, BP 129/78, HR 95, BSA 1.76, O2  100%RA, Ht-cm 157.48, Wt-kg 70.76, Wt Change 3.2 lb.      **Physical Examination**    ---     _GENERAL_ :    General Appearance: Well  developed, well hydrated, no acute distress.  Mood/Affect: Mood and affect within normal limits.[name] HEENT moist oral mucosa,  neck supple. Neck No carotid bruits, supple. Skin Normal, there is no rash or  other skin lesions noted. Cardiovascular regular rate and rhythm, normal S1  S2, no murmurs, extremities warm without swelling. Lungs Clear to  auscultation; no wheezes, rhonchi or rales. Abdomen Soft, nontender,  nondistended, no organmegaly, normoactive bowel sounds, no peritoneal signs.  Extremities warm and well perfused,  healed scars on bilateral knees. Neuro  alert and oriented, exam grossly intact. Digital rectal exam Deferred.         **Assessments**    ---    1\. Upper GI bleeding - K92.2 (Primary)    ---    2\. Scleroderma - M34.9    ---    3\. Abnormal LFTs - R79.89    ---    4\. Diverticulosis - K57.90    ---    5\. Gastric erosion determined by endoscopy - K25.9    ---    6\. Nausea - R11.0    ---     Mrs. Vaquera presents today for follow up of prior GI bleed. Review of records  confirmed only gastric erosions and diverticulosis on EGD and colonoscopy.  Suspect prior bleeding was due to diverticular disease. No recurrance and Hgb  normalized so no indication for repeat at this time. She does report  intermittent nausea and early satiety. In the context of known diabetes and  few medications with antocholinergic properties delayed gastric emptying is a  possibility. Prednisone related gastritis also possible. Will get a gastric  emptying study. In the meantime would continue antiacid therapy.    She will continue follow up with hepatology. I will contact her to discuss  results of gastric emptying study.    ---      **Treatment**    ---      **1\. Nausea**    _IMAGING: Gastric Emptying Study_    ---      **Follow Up**    ---    4 Months    Electronically signed by [name] [name] , MD on 10/02/2019 at 09:04 PM EDT    Sign off status: Completed        * * *        GI Clinic    [address] [place], [address]    [place], [place] [address]    Tel: [phone]    Fax: [phone]              * * *          Progress Note: [name] [name] 10/02/2019    ---    Note generated by eClinicalWorks EMR/PM Software ([email or web address])

## 2019-10-10 ENCOUNTER — Ambulatory Visit

## 2019-10-10 NOTE — Telephone Encounter (Signed)
 Call Details:   Patient PCP = [name] Goodie MD -JN 6A-  [name] (Patient) called on October 10, 2019 11:37 AM.  Message taken by: Edwena Felty  Primary call-back number: [phone]    Secondary call-back number: () -    Call Reason(s): Message/Call-Back      ** MESSAGE / CALL-BACK.  Regarding: pt would like to  reschedule her appt with [name], however It looks like [name] schedule isn't up yet in [facility]. Attemped to book the pt with a NP or a NP . Pt only wanted to see Dr. Tomasa Rand. Please call pt back to reschedule     ---------- ---------- ---------- ---------- ---------- ----------       RESPONSE/ORDERS:  I s/w the pt and she decided to transfer to a different doctor because Dr. 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-Q21.1)  SCREENING EXAM FOR BREAST CANCER (ICD-V76.10) (ICD10-Z12.39)  ANEMIA, OTHER (ICD-285.8) (ICD10-D64.89)  LOWER EXTREMITY EDEMA (ICD-782.3) (ICD10-R60.0)  IRREGULAR HEART BEATS (ICD-427.9) (ICD10-I49.9)  GI BLEED (ICD-578.9) (VEH20-N47.2)  SCLERODERMA (ICD-710.1) (ICD10-M34.9)  SCREENING FOR CERVICAL CANCER (ICD-V76.2) (SJG28-Z66.4)  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) (ICD10-M47.812)  NECK PAIN (ICD-723.1) (ICD10-M54.2)  STEROID USE, LONG TERM (ICD-V58.65) (ICD10-Z79.52)  PREMATURE VENTRICULAR CONTRACTIONS (ICD-427.69) (ICD10-I49.3)  HYPERLIPIDEMIA (ICD-272.4) (ICD10-E78.5)  BACK PAIN, LUMBAR, CHRONIC (ICD-724.2) (ICD10-M54.5)  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)  DIABETES MELLITUS, TYPE II  (ICD-250.00) (ICD10-E11.9)  LONG-TERM (CURRENT) USE OF STEROIDS (ICD-V58.65) (ICD10-Z79.51)  SKIN SAGGING DUE TO WEIGHT LOSS (ICD-757.9) (ICD10-Q84.9)  TRANSAMINASES, SERUM, ELEVATED (ICD-790.4) (ICD10-R74.0)  OBESITY, BMI 30-34.9, ADULT (ICD-278.00) (ICD10-E66.9)      DIABETES MELLITUS, TYPE II, UNCONTROLLED (ICD-250.02) (ICD10-E11.65)      FATTY LIVER DISEASE (ICD-571.8) (ICD10-K76.0)  SCLERODERMA, 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)  HYPOTHYROIDISM (ICD-244.9) (ICD10-E03.9)  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) (ICD10-D80.8)  SPINAL STENOSIS, 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)  HEPATITIS C EXPOSURE (HCV RNA NEGATIVE, 01/2014) (ICD-V02.62) (ICD10-Z20.5)  PAIN IN JOINT, HAND (ICD-719.44) (ICD10-M79.643)  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)  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)  CARPAL TUNNEL (ICD-354.0) (ICD10-G56.00)  OSTEOARTHRITIS (ICD-715.09) (ICD10-M15.9)  S/P ROTATOR CUFF SURGERY 12/2004 - RT SHOULDER; 2008 LT (ICD-V45.89)  Family Hx of MELANOMA, FAMILY HX (ICD-V16.8) (ICD10-Z80.8)  FRACTURE OF OTHER SPEC SITE,  PATHOLOGIC - MULTIPLE (ICD-733.19)  CHOLECYSTECTOMY AND HERNIA REPAIR (ICD-V45.89)  VITAMIN D DEFICIENCY (ICD-268.9) (ICD10-E55.9)  COLONOSCOPY, NEXT 2020- SEE COMMENT (ICD-V76.51) (ICD10-Z01.89)    Meds (prior to this call):   HYDROXYCHLOROQUINE SULFATE 200 MG ORAL TABLET (HYDROXYCHLOROQUINE SULFATE) one tablet oral daily; Route: ORAL  JARDIANCE 10 MG ORAL TABLET (EMPAGLIFLOZIN) Take 1 tablet by mouth once daily; Route: ORAL  ROSUVASTATIN 10MG  TABLETS (ROSUVASTATIN CALCIUM) TAKE 1 TABLET BY MOUTH EVERY DAY  LISINOPRIL 2.5MG  TABLETS (LISINOPRIL) TAKE 1 TABLET BY MOUTH ONCE DAILY  METFORMIN HCL ER 500 MG XR24H-TAB (METFORMIN HCL) TAKE 4 TABLETS BY MOUTH EVERY MORNING  GLIPIZIDE ER 2.5 MG XR24H-TAB (GLIPIZIDE) TAKE 1 TABLET BY MOUTH DAILY  OMEPRAZOLE 20 MG ORAL CAPSULE DELAYED RELEASE (OMEPRAZOLE) Take one capsule by mouth twice a day; Route: ORAL  BACLOFEN 10MG   TABLETS (BACLOFEN) TAKE 1/2 TABLET  BY MOUTH EVERY DAY AS NEEDED FOR BACK OR SPASMS  DICLOFENAC SODIUM 1 % TRANSDERMAL GEL (DICLOFENAC SODIUM) APP 2 GRAMS EXT AA BID      FREESTYLE FREEDOM LITE w/Device KIT (BLOOD GLUCOSE MONITORING SUPPL) use as directed (ICD 10 E11.65)      FREESTYLE LITE BLOOD GLUCOSE STRIPS (GLUCOSE BLOOD) USE TO CHECK BLOOD GLUCOSE THREE TIMES DAILY      FREESTYLE LANCETS (LANCETS) check fingerstick three times a day (ICD 10 E11.65)  TRAMADOL HCL 50 MG ORAL TABLET (TRAMADOL HCL) take one tablet daily as needed for pain; Route: ORAL  OXYCODONE HCL 5 MG ORAL TABLET (OXYCODONE HCL) Take one tablet daily as needed for pain >8. Partial fill upon patient request.; Route: ORAL  CALCIUM CARBONATE-VITAMIN D 600-200 MG-UNIT ORAL TABLET (CALCIUM CARBONATE-VITAMIN D) take one tablet twice a day; Route: ORAL  GABAPENTIN 300MG  CAPSULES (GABAPENTIN) TAKE 1 CAPSULE BY MOUTH EVERY NIGHT AT BEDTIME AS NEEDED FOR PAIN  * BLOODWORK Please draw chemistry (sodium, potassium, magnesium, chloride, bicarbonate, glucose, creatinine, BUN), CBC, TSH, lipids, HbA1c. Fax result to [phone] Dr. Marlane [name]  PREDNISONE 10 MG ORAL TABLET (PREDNISONE) Take 3 tablets once a day; Route: ORAL  FAMOTIDINE 20 MG TABLET (FAMOTIDINE) TAKE 1 TABLET BY MOUTH AT NIGHT AS NEEDED  LIDODERM 5 % EXTERNAL PATCH (LIDOCAINE) Apply one patch to back once daily  as needed for pain, Remove after 12 hrs; Route: EXTERNAL  MYCOPHENOLATE MOFETIL 500 MG ORAL TABLET (MYCOPHENOLATE MOFETIL) Take 2 tablets twice daily; Route: ORAL            Created By Edwena Felty on 10/10/2019 at 11:37 AM    Electronically Signed By [name] Goodie MD -JN 6A- on 10/14/2019 at 01:45 PM

## 2019-10-13 ENCOUNTER — Ambulatory Visit

## 2019-10-13 ENCOUNTER — Ambulatory Visit: Admitting: Rheumatology

## 2019-10-13 NOTE — Progress Notes (Signed)
* * *      [name], [name] **DOB:** June 05, 1960 (59 yo F) **Acc No.** [id number] **DOS:**  10/13/2019    ---       [name], [name]**    ------    29 Y old Female, DOB: 1960-11-09    [address], [place], [place] [address]    Home: [phone]    Provider: [name]        * * *    Telephone Encounter    ---    Answered by  Denton Meek Date: 10/13/2019       Time: 03:59 PM    Reason  * [name] APPT AH    ------            Message                     -Oncology [facility] at [facility]-Dr. looked at CT and MRI-told her there is nothing there and to deal with the pain      -Pain management-Wants to wait on Wednesday bone scan.      [name]?                Action Taken                     [facility]  10/13/2019 4:04:59 PM > I will call pt Thursday or Friday to get her on [name]'s schedule      [name],[name]  10/14/2019 10:32:11 AM > Friday at 12:00 appt.  Can you call her and let her know?!      Ye,[name]  10/16/2019 12:53:07 PM > lvm for pt.                    * * *                ---          * * *         Provider: [name] 10/13/2019    ---    Note generated by eClinicalWorks EMR/PM Software ([email or web address])

## 2019-10-15 ENCOUNTER — Ambulatory Visit: Admitting: Physician Assistant

## 2019-10-15 ENCOUNTER — Ambulatory Visit: Admitting: Rheumatology

## 2019-10-15 ENCOUNTER — Ambulatory Visit: Admit: 2019-10-15

## 2019-10-15 MED ORDER — Omeprazole: 40 | 90 | Freq: Every day | 1 refills | 0 days | Status: AC

## 2019-10-20 ENCOUNTER — Ambulatory Visit

## 2019-10-20 ENCOUNTER — Ambulatory Visit: Admitting: Rheumatology

## 2019-10-20 ENCOUNTER — Ambulatory Visit: Admit: 2019-10-20

## 2019-10-20 NOTE — Progress Notes (Signed)
 .  Progress Notes  .  Patient: [name], [name] [facility]  Provider: [name]  DOB:1960-12-07 Age: 59 Y Sex: Female  .  PCP: Ruffin Frederick    Date: 10/20/2019  .  --------------------------------------------------------------------------------  .  HISTORY OF PRESENT ILLNESS  .  GENERAL:   She is here today for FU of her hand, back and leg pain. She  notes that the pain in hands is still a problem, but now off  steroids, only CellCept and has been continuing to slowly  improve. Remaining joints are OK and she generally feels well.Her  primary problem remains her right hip/back. She is using a  compound of gabapentin/lidocaine/diclofenac. She can't walk more  than a few feet without excrutiating pain. She can sit with no  problem, lie down with no problem, swim with no problem. Just  can't bear weight. Pain is in right lower back and IT band goes  across the lowe back just barely to the left side. She underwent  boscan for evaluation of ? pagets in her pelvis, which was  normal.  .  CURRENT MEDICATIONS  .  Taking Acetaminophen 500 MG Tablet 2 tablets as needed Orally  every 8 hrs, Notes: as needed  Taking Baclofen 10 MG Tablet 1/2 tab Orally prn, Notes: prn  Taking Calcium Carbonate-Vitamin D 600-200 MG-UNIT Capsule 1  capsule with a meal Orally Twice a day  Taking Famotidine 40 MG Tablet 1 tablet at bedtime Orally Once a  day, Notes: prn  Taking Gammagard - Solution as directed Injection once every 4  weeks, Notes: every 4 weeks  Taking glipiZIDE ER 5 MG Tablet Extended Release 24 Hour TAKE 1  TABLET BY MOUTH EVERY DAY  Taking glipiZIDE XL 5 MG Tablet Extended Release 24 Hour 1 tablet  Orally Once a day  Taking Glucophage XR 500 mg Tablet Extended Release 24 Hour 4  tablets Orally Once a day  Taking Hydroxychloroquine Sulfate 200 MG Tablet 1 tablet with  food or milk Orally Once a day  Taking Jardiance 10 MG Tablet 1 tablet Orally Once a day  Taking Lidocaine 5 % Patch 1 patch remove after 12 hours  Externally Once  a day, Notes: prn  Taking Lisinopril 2.5 MG Tablet 1 tablet Orally Once a day  Taking metFORMIN HCl ER 500 MG Tablet Extended Release 24 Hour  TAKE 4 TABLETS BY MOUTH EVERY DAY  Taking Mycophenolate Mofetil 500 MG Tablet 2 tablet Orally twice  daily  Taking Omeprazole 40 mg Capsule Delayed Release 1 capsule Orally  Once a day  Taking oxyCODONE HCl 5 MG Tablet 1 tablet as needed Orally every  4-6 hrs, Notes: prn  Taking traMADol HCl 50 MG Tablet 1 tablet Orally every 6 hrs,  Notes: PRN - rarely takes  Taking Ursodiol 500 MG Tablet 1 tablet Orally Twice a day  Taking Vitamin B Complex - Tablet 1 tablet Orally once daily  Taking Vitamin D 25 MCG (1000 UT) Tablet 1 tablet Orally Once a  day  Not-Taking/PRN MiraLax 17 GM Packet 1 packet mixed with 8 ounces  of fluid Orally Once a day  Not-Taking/PRN predniSONE 10 mg Tablet 1 tablet Orally Once a day  Not-Taking/PRN Rituxan 500 MG/50ML Solution as directed  Intravenous , Notes: Last dose 05/19/19  Medication List reviewed and reconciled with the patient  .  PAST MEDICAL HISTORY  .  Scleroderma - CREST dx 2007  IgG4 deficiency s/p IVIG 2010 ----single infusion given  preventively after  week of bilateral knee replacements at [facility]  2010  Fx left wrist in 1994  Blood clot at LUE in 2006 on a short course of Coumadin  Bilateral carpal tunnel syndrome s/p Lt carpal tunnel release and  steroids injection right--  Bone spur left foot  Scoliosis  Spinal stenosis s/p steroids injection  s/p bilateral knee replacements for valgus /arthritic  complications, performed by Dr. Katrinka Blazing 2010--- never infected, but  packed with antibiotics with surgery;  Right rotator cuff repairs x 4, complicated by repeat tears,  infection, placement of anchor material  Elevated liver function tests  Diabetes  Seronegative RA  GI bleed  GERD  Pagets disease  Stage II fibrosis of Liver  .  ALLERGIES  .  N.K.D.A.  [name]  REVIEW OF SYSTEMS  .  Rheumatology:  .  General    No fever, weight loss, swollen glands .  Muscoskeletal     +Joint pain, +joint swelling, +morning stiffness  . Eyes    No  vision loss, eye dry, red eye, eye pain . Mouth    No mouth sores  . Cardiovascular    ? raynaud, no chest pain , irregular heart  beat, racing heart beat, leg swellingUpcoming Stress Test .  Pulmonary    No cough, cough blood, shortness of breath . Skin     No rash, photosensitivity . Lymphatics    No swollen glands,  tender glands .  [name]  VITAL SIGNS  .  Pain scale 7, Ht-in 62, Wt-lbs 155, BMI 28.35, BP 121/73, HR 63.  [name]  EXAMINATION  .  Rheumatology:  General Appearance: Alert and oriented , No apparent distress .  Eyes: No, scleral icterus, scleral erythema .  Pulm:Clear to auscultation bilaterally.  Skin:No rashes or nail changes, mo obvious digital ulceration.  MuskuloskeletalElbows, shoulders, hips, knees have intact ROM.  Hands have apparent synovitis in the 2-3 PIPs and MCPs  bilaterally. She has tenderness at the right lower back lateral  to the sacrum, near but not over SI joints and down along IT band  to mud thigh. ROM is intact at the right hip. There is paraspinal  muscle spasm. Knees, ankles and feet have no evidence of  synovitits..  Skin and Nails Minimal sclerodactyly, no cracking atthe  fingertips, no ulcers.  .  ASSESSMENTS  .  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  Scleroderma - M34.9  .  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  Long-term use of high-risk medication - Z79.899  .  PFO (patent foramen ovale) - Q21.1  .  Regarding her inflammatory arthritis, Xoie seems to be doing  better on CellCept and is now off steroids for the first time in  over a year. She continues to get IVIG infusions which are  critical in terms of preventing infections given her  immunosuppression and CVID. She does still have significant back  and leg pain, which is inhibiting her ability to walk. Based on  MRI and bone scan, I think the evidence of prior Paget's is a red  herring. She has significant muscle  spasm and I suspect the  majority of her pain is a downstream consequence of severely  altered gait since her Achilles rupture and rehab. But it is  discouraging that she has not gotten better with PT and topicals.  I recommend that she undergo eval by a pain clinic for deep  steroid injection. I offered a trial of lidocaine injection as a  diagnostic  approach to see if we can pinpoint the source of pain.  However she actually got an appointment with a local pain doc  this afternoon so I will defer that for now. .From a GI and  cardiology standpoint she has been stable. She has no evidence of  ongoing bleeding. She continues to have elevated LFT's of unclear  etiology. Marland KitchenPLAN:- Follow-up with pain clinic - Encourage physical  activity as tolerated- Follow-up after pain clinica eval in 2  months- Continue CellCept, recent labs show no toxicity (LFT  elevations are chronic), no repeat needed today.  .  TREATMENT  .  Seronegative rheumatoid arthritis  Continue Gammagard Solution, -, as directed, Injection, once  every 4 weeks, Notes: every 4 weeks  Continue Hydroxychloroquine Sulfate Tablet, 200 MG, 1 tablet with  food or milk, Orally, Once a day  Continue Mycophenolate Mofetil Tablet, 500 MG, 2 tablet, Orally,  twice daily  Continue Omeprazole Capsule Delayed Release, 40 mg, 1 capsule,  Orally, Once a day  .  FOLLOW UP  .  2 Months  .  Electronically signed by Erasmo Leventhal , MD on  11/06/2019 at 11:57 AM EDT  .  Document electronically signed by [name]

## 2019-10-20 NOTE — Progress Notes (Signed)
 [name], [name] **DOB:** [birth year] (59 yo F) **Acc No.** [id number] **DOS:**  10/20/2019    ---       [name], [name]**    ------    [name], DOB: 05/26/1960, External MRN: [id number]    Account Number: [id number]    [address], [place], [place]    Home: [phone]    Guarantor: [name] Insurance: H96 [employer] PPO    PCP: Ruffin Frederick Referring: Ruffin Frederick    Appointment Facility: Rheumatology, Allergy [facility] Immunology        * * *    10/20/2019 Progress Notes: [name], MD **CHN#:** [id number]    ------    ---       **History of Present Illness**    ---     _GENERAL_ :    She is here today for FU of her hand, back and leg pain. She notes that the  pain in hands is still a problem, but now off steroids, only CellCept and has  been continuing to slowly improve. Remaining joints are OK and she generally  feels well.    Her primary problem remains her right hip/back. She is using a compound of  gabapentin/lidocaine/diclofenac. She can't walk more than a few feet without  excrutiating pain. She can sit with no problem, lie down with no problem, swim  with no problem. Just can't bear weight. Pain is in right lower back and IT  band goes across the lowe back just barely to the left side. She underwent  boscan for evaluation of ? pagets in her pelvis, which was normal.      **Current Medications**    ---    Taking    * Acetaminophen 500 MG Tablet 2 tablets as needed Orally every 8 hrs, Notes: as needed    ---    * Baclofen 10 MG Tablet 1/2 tab Orally prn, Notes: prn    ---    * Calcium Carbonate-Vitamin D 600-200 MG-UNIT Capsule 1 capsule with a meal Orally Twice a day    ---    * Famotidine 40 MG Tablet 1 tablet at bedtime Orally Once a day, Notes: prn    ---    * Gammagard - Solution as directed Injection once every 4 weeks, Notes: every 4 weeks    ---    * glipiZIDE ER 5 MG Tablet Extended Release 24 Hour TAKE 1 TABLET BY MOUTH EVERY DAY     ---    * glipiZIDE XL 5 MG Tablet  Extended Release 24 Hour 1 tablet Orally Once a day    ---    * Glucophage XR 500 mg Tablet Extended Release 24 Hour 4 tablets Orally Once a day    ---    * Hydroxychloroquine Sulfate 200 MG Tablet 1 tablet with food or milk Orally Once a day    ---    * Jardiance 10 MG Tablet 1 tablet Orally Once a day    ---    * Lidocaine 5 % Patch 1 patch remove after 12 hours Externally Once a day, Notes: prn    ---    * Lisinopril 2.5 MG Tablet 1 tablet Orally Once a day    ---    * metFORMIN HCl ER 500 MG Tablet Extended Release 24 Hour TAKE 4 TABLETS BY MOUTH EVERY DAY     ---    * Mycophenolate Mofetil 500 MG Tablet 2  tablet Orally twice daily    ---    * Omeprazole 40 mg Capsule Delayed Release 1 capsule Orally Once a day    ---    * oxyCODONE HCl 5 MG Tablet 1 tablet as needed Orally every 4-6 hrs, Notes: prn    ---    * traMADol HCl 50 MG Tablet 1 tablet Orally every 6 hrs, Notes: PRN - rarely takes    ---    * Ursodiol 500 MG Tablet 1 tablet Orally Twice a day    ---    * Vitamin B Complex - Tablet 1 tablet Orally once daily    ---    * Vitamin D 25 MCG (1000 UT) Tablet 1 tablet Orally Once a day    ---    Not-Taking/PRN    * MiraLax 17 GM Packet 1 packet mixed with 8 ounces of fluid Orally Once a day    ---    * predniSONE 10 mg Tablet 1 tablet Orally Once a day    ---    * Rituxan 500 MG/50ML Solution as directed Intravenous , Notes: Last dose 05/19/19    ---    * Medication List reviewed and reconciled with the patient    ---      **Past Medical History**    ---      Scleroderma - CREST dx 2007.        ---    IgG4 deficiency s/p IVIG 2010 ----single infusion given preventively after  week of bilateral knee replacements at [facility] 2010.        ---    Fx left wrist in 1994.        ---    Blood clot at LUE in 2006 on a short course of Coumadin.        ---    Bilateral carpal tunnel syndrome s/p Lt carpal tunnel release and steroids  injection right--.        ---    Bone spur left foot.        ---    Scoliosis.        ---     Spinal stenosis s/p steroids injection.        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 Katrinka Blazing 2010--- never infected, but packed with antibiotics with  surgery;.        ---    Right rotator cuff repairs x 4, complicated by repeat tears, infection,  placement of anchor material.        ---    Elevated liver function tests.        ---    Diabetes.        ---    Seronegative RA.        ---    GI bleed .        ---    GERD.        ---    Pagets disease.        ---    Stage II fibrosis of Liver.        ---      **Allergies**    ---      N.K.D.A.    ---      **Review of Systems**    ---     _Rheumatology_ :    General No fever, weight loss, swollen glands. Muscoskeletal +Joint pain,  +joint swelling, +morning stiffness . Eyes No vision loss, eye  dry, red eye,  eye pain. Mouth No mouth sores. Cardiovascular ? raynaud, no chest pain ,  irregular heart beat, racing heart beat, leg swellingUpcoming Stress Test.  Pulmonary No cough, cough blood, shortness of breath. Skin No rash,  photosensitivity. Lymphatics No swollen glands, tender glands.         **Vital Signs**    ---    Pain scale 7, Ht-in 62, Wt-lbs 155, BMI 28.35, BP 121/73, HR 63.      **Examination**    ---     _Rheumatology:_    General Appearance:  Alert and oriented , No apparent distress .    Eyes:  No, scleral icterus, scleral erythema .    Pulm: Clear to auscultation bilaterally.    Skin: No rashes or nail changes, mo obvious digital ulceration.    Muskuloskeletal Elbows, shoulders, hips, knees have intact ROM. Hands have  apparent synovitis in the 2-3 PIPs and MCPs bilaterally. She has tenderness at  the right lower back lateral to the sacrum, near but not over SI joints and  down along IT band to mud thigh. ROM is intact at the right hip. There is  paraspinal muscle spasm. Knees, ankles and feet have no evidence of  synovitits..    Skin and Nails  Minimal sclerodactyly, no cracking atthe fingertips,  no  ulcers.         **Assessments**    ---    1\. 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 Scleroderma - M34.9    ---    3\. 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 Long-term use of high-risk medication - Z79.899    ---    5\. PFO (patent foramen ovale) - Q21.1    ---     Regarding her inflammatory arthritis, [name] seems to be doing better on  CellCept and is now off steroids for the first time in over a year. She  continues to get IVIG infusions which are critical in terms of preventing  infections given her immunosuppression and CVID. She does still have  significant back and leg pain, which is inhibiting her ability to walk. Based  on MRI and bone scan, I think the evidence of prior Paget's is a red herring.  She has significant muscle spasm and I suspect the majority of her pain is a  downstream consequence of severely altered gait since her Achilles rupture and  rehab. But it is discouraging that she has not gotten better with PT and  topicals. I recommend that she undergo eval by a pain clinic for deep steroid  injection. I offered a trial of lidocaine injection as a diagnostic approach  to see if we can pinpoint the source of pain. However she actually got an  appointment with a local pain doc this afternoon so I will defer that for now.    .    From a GI and cardiology standpoint she has been stable. She has no evidence  of ongoing bleeding. She continues to have elevated LFT's of unclear etiology.    [name]    PLAN:    - Follow-up with pain clinic    - Encourage physical activity as tolerated    - Follow-up after pain clinica eval in 2 months    - Continue CellCept, recent labs show no toxicity (LFT elevations are  chronic), no repeat needed today.    ---      **  Treatment**    ---      **1\. Seronegative rheumatoid arthritis**    Continue Gammagard Solution, -, as directed, Injection, once every 4 weeks,  Notes: every 4 weeks    Continue Hydroxychloroquine  Sulfate Tablet, 200 MG, 1 tablet with food or  milk, Orally, Once a day    Continue Mycophenolate Mofetil Tablet, 500 MG, 2 tablet, Orally, twice daily    Continue Omeprazole Capsule Delayed Release, 40 mg, 1 capsule, Orally, Once a  day    ---      **Follow Up**    ---    2 Months    Electronically signed by Erasmo Leventhal , MD on 11/06/2019 at 11:57 AM EDT    Sign off status: Completed        * * *        Rheumatology, Allergy and Immunology    [address]    [place] [address], [address]    [place], [place] [address]    Tel: [phone]    Fax: [phone]              * * *          Progress Note: [name], MD 10/20/2019    ---    Note generated by eClinicalWorks EMR/PM Software ([email or web address])

## 2019-10-27 ENCOUNTER — Ambulatory Visit: Admitting: Orthopaedic Surgery

## 2019-10-27 NOTE — Progress Notes (Signed)
* * *      [name], [name] **DOB:** Jun 15, 1960 (59 yo F) **Acc No.** [id number] **DOS:**  10/27/2019    ---       [name], [name]**    ------    53 Y old Female, DOB: 1960-09-06    [address], [place], [place] [address]    Home: [phone]    Provider: Addison Naegeli        * * *    Telephone Encounter    ---    Answered by  Carmin Richmond Date: 10/27/2019       Time: 08:13 AM    Message                     She's an old [facility] patient.  He did her knee.  She did somthing that she's not able to walk.  I told her that you would reach out to her for a sooner appointment.      [phone]        ------            Action Taken                     [name],[name]  10/27/2019 8:21:57 AM >s/w patient and forcebooked her for open slot on 8/13.                    * * *                ---          * * *         Provider: Roger Shelter, Sergei Delo 10/27/2019    ---    Note generated by eClinicalWorks EMR/PM Software ([email or web address])

## 2019-10-29 ENCOUNTER — Ambulatory Visit: Admitting: Physician Assistant

## 2019-10-29 MED ORDER — Hydroxychloroquine Sulfate: 200 | 90 | Freq: Every day | 1 refills | 0 days | Status: AC

## 2019-10-31 ENCOUNTER — Ambulatory Visit: Admitting: Student in an Organized Health Care Education/Training Program

## 2019-10-31 ENCOUNTER — Ambulatory Visit: Admit: 2019-10-31

## 2019-11-03 ENCOUNTER — Ambulatory Visit: Admitting: Physical Medicine & Rehabilitation

## 2019-11-03 ENCOUNTER — Ambulatory Visit

## 2019-11-03 ENCOUNTER — Ambulatory Visit: Admit: 2019-11-03

## 2019-11-03 NOTE — Progress Notes (Signed)
 [name], [name] **DOB:** 1960-08-28 (59 yo F) **Acc No.** [id number] **DOS:**  11/03/2019    ---       [name], [name]**    ------    75 Y old Female, DOB: [birth year], External MRN: [id number]    Account Number: [id number]    [address], [place], [place]    Home: [phone]    Guarantor: [name] Insurance: H96 NHP PPO    PCP: Ruffin Frederick Referring: Ruffin Frederick External Visit ID: [id number]    Appointment Facility: Physical Medicine and Rehab        * * *    11/03/2019 Progress Notes: [name] **CHN#:** [id number]    ------    ---       **Current Medications**    ---    Taking    * Acetaminophen 500 MG Tablet 2 tablets as needed Orally every 8 hrs, Notes: as needed    ---    * Baclofen 10 MG Tablet 1/2 tab Orally prn, Notes: prn    ---    * Calcium Carbonate-Vitamin D 600-200 MG-UNIT Capsule 1 capsule with a meal Orally Twice a day    ---    * Famotidine 40 MG Tablet 1 tablet at bedtime Orally Once a day, Notes: prn    ---    * Gammagard - Solution as directed Injection once every 4 weeks, Notes: every 4 weeks    ---    * glipiZIDE ER 5 MG Tablet Extended Release 24 Hour TAKE 1 TABLET BY MOUTH EVERY DAY     ---    * Glucophage XR 500 mg Tablet Extended Release 24 Hour 4 tablets Orally Once a day    ---    * Hydroxychloroquine Sulfate 200 MG Tablet 1 tablet with food or milk Orally Once a day    ---    * Jardiance 10 MG Tablet 1 tablet Orally Once a day    ---    * Lidocaine 5 % Patch 1 patch remove after 12 hours Externally Once a day, Notes: prn    ---    * Lisinopril 2.5 MG Tablet 1 tablet Orally Once a day    ---    * Mycophenolate Mofetil 500 MG Tablet 2 tablet Orally twice daily    ---    * Omeprazole 40 mg Capsule Delayed Release 1 capsule Orally Once a day    ---    * oxyCODONE HCl 5 MG Tablet 1 tablet as needed Orally every 4-6 hrs, Notes: prn    ---    * traMADol HCl 50 MG Tablet 1 tablet Orally every 6 hrs, Notes: PRN - rarely takes    ---    * Ursodiol 500 MG Tablet 1  tablet Orally Twice a day    ---    * Vitamin B Complex - Tablet 1 tablet Orally once daily    ---    * Vitamin D 25 MCG (1000 UT) Tablet 1 tablet Orally Once a day    ---    Not-Taking/PRN    * glipiZIDE XL 5 MG Tablet Extended Release 24 Hour 1 tablet Orally Once a day    ---    * MiraLax 17 GM Packet 1 packet mixed with 8 ounces of fluid Orally Once a day    ---    * predniSONE 10 mg Tablet 1 tablet Orally Once a day    ---    *  Rituxan 500 MG/50ML Solution as directed Intravenous , Notes: Last dose 05/19/19    ---     Past Medical History    ---      Scleroderma - CREST dx 2007.        ---    IgG4 deficiency s/p IVIG 2010 ----single infusion given preventively after  week of bilateral knee replacements at [facility] 2010.        ---    Fx left wrist in 1994.        ---    Blood clot at LUE in 2006 on a short course of Coumadin.        ---    Bilateral carpal tunnel syndrome s/p Lt carpal tunnel release and steroids  injection right--.        ---    Bone spur left foot.        ---    Scoliosis.        ---    Spinal stenosis s/p steroids injection.        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 Katrinka Blazing 2010--- never infected, but packed with antibiotics with  surgery;.        ---    Right rotator cuff repairs x 4, complicated by repeat tears, infection,  placement of anchor material.        ---    Elevated liver function tests.        ---    Diabetes.        ---    Seronegative RA.        ---    GI bleed .        ---    GERD.        ---    Pagets disease.        ---    Stage II fibrosis of Liver.        ---      **Surgical History**    ---      Right rotator cuff repair, with infected hardware that had to be removed  2006    ---    Repeat right shoulder surgery, also which became infected. 2007    ---    Left rotator cuff repair 2008    ---    bilateral knee replacements 2009    ---    Left arthroscopic carpal tunnel release 01/2011    ---    ORIF Left 4th metatarsal  bone    ---    Left Ulna shortening 1994    ---    Knee replacement 2010    ---    Hand surgery 2013, 2015    ---    remove gallbladder/hernia 1990    ---    Plate left foot 3rd metatarsal 2008    ---    Left reverse TSA 03/07/16    ---    Left Hamstring Repair 05/2019    ---      **Family History**    ---      Mother: deceased 20 yrs, lung cancer, hyperthyroidism, diagnosed with Other  malignant neoplasm of unspecified site    ---    Father: deceased 15s yrs, heart attack, Unspecified heart disease    ---    Siblings: alive, Brother - scleroderma, COPD    ---    3 brother(s) .    ---    Denies family history of GI cancer or liver  disease.    ---      **Social History**    ---    Tobacco history: Never smoked.    Marital Status  Single.    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    Alcohol  Former daily EtOH use in 20s.    Abuse/Neglect Do you feel unsafe in your relationships? No, Plan Resources  Provided, Patient states they feel safe to return home, Have you ever been  hit, kicked, punched or otherwise hurt by someone in the past year? No.    Illicit drugs: Denies.     **Allergies**    ---      N.K.D.A.    ---    [name]]      **Hospitalization/Major Diagnostic Procedure**    ---      as above    ---    GI bleed 05/2019    ---      **Review of Systems**    ---     _ORT_ :    Eyes No. Ear, Nose Throat No. Digestion, Stomach, Bowel No. Bladder Problems  No. Bleeding Problems No. Numbness/Tingling No. Anxiety/Depression No.  Fever/Chills/Fatigue No. Chest Pain/Tightness/Palpitations No. Skin Rash No.  Dental Problems No. Joint/Muscle Pain/Cramps Yes. Blackout/Fainting No. Other  No.          **Reason for Appointment**    ---      1\. 78M FU RT HIP    ---      **History of Present Illness**    ---     _General_ :    [name] for follow-up of right sided buttock and lateral leg pain. She  was last seen on 09-02-2019 at which time, we did right ITB hydrodissection  under  ultrasound guidance. She reports having a few days of relief before pain  returned. In the interim, she saw Dr Tomasita Morrow at [facility], who  thinks she may have a component of lumbar radicular pain and she is scheduled  for a spinal intervention on August 9. She has developed new pain down to the  lateral calf, which is what has increased his suspicion. She also saw Dr  Dareen Piano at [facility]) on recommendation from Dr Leandro Reasoner and  was not told there was anything to report. Pain is graded 6/10. She has been  using the compound cream, which takes the burning sensation away. She denies  any weakness, numbness or tingling into the right leg. She denies any new  bowel or bladder dysfunction.      **Vital Signs**    ---    Pain scale 6, Ht-in 62, Ht-cm 157.48.      **Physical Examination**    ---    General: NAD    Psych: Mood and affect appropriate    HEENT: NC/AT    CV: warm and well perfused    Pulm: breathing unlabored, normal chest expansions    Skin: No rash, swelling, ecchymoses, or erythema    Gait: not antalgic; able to heel, toe and tandem walk    (+) TTP over right SIJ, gluteus maximus, right piriformis, right TFL, GTB;  left gluteus maximus    5/5 motor strength B/L except 4-/5 hip abductors on right    Sensation intact to light touch in all dermatomes    Reflex 1+ B/L patellar and Achilles    (-) Gaenslen's test    (-) SIJ compression test    No pain on transition from sitting to  standing, standing to sitting.      **Assessments**    ---    1\. Trochanteric bursitis, right hip - M70.61 (Primary)    ---    2\. Iliotibial band syndrome of right side - M76.31    ---    3\. 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 Myofascial pain - M79.18    ---     [name] is a 59 year old female with left proximal hamstring tear  fixed surgically, who presents with persistent right buttock and lateral leg  pain. Her history and physical examination are most consistent with  myofascial  pain coming from gluteus maximus, piriformis and tight ITB/TFL which improves  with trigger point injections, but only temporarily.    -Pt educated regarding diagnosises and treatment options     -Imaging reviewed and discussed with pt     -Tiers of treatment discussed     -Continue PT, HEP as tolerated    -Continue dry needling PRN    -Myofascial trigger point injectiond performed today, see procedure note below     -Cool compresses or warm heat to affected regions prn    -Continue compound cream PRN     -Continue f/u with Dr Cecile Sheerer at [facility] for interventions as recommended    -Discussed red flag symptoms such as focal neurologic deficit, bowel or bladder dysfunction and to report to ED if they develop these symptoms    -Follow up in 8 wks    A total of 30 minutes was spent preparing for and during this encounter  including: review of imaging, performing medically appropriate examination,  counseling/education regarding treatment plans/injections and clinical  documentation into the electronic health record.    ---      **Procedures**    ---    Myofascial trigger point injections    Procedure performed by: Patrick North, DO    Injectate: 10ml total volume containing 4ml bupivacaine 0.25% with 6ml  lidocaine 1% without epinephrine    After risks, benefits and alternative treatment options and prognosis were  discussed, the patient signed informed consent. The patient's upper back was  examined for trigger points, with 4 total points found in the following  muscles: right gluteus maximus, piriformis, TFL, GTB. These trigger points  were cleaned and prepped using alcohol. A 25G 1.5" needle was then inserted  and after negative aspiration, the above injectate was delivered with  subsequent dry needling at each identified. The patient tolerated the  procedure well and there were no complications, she was given post-procedure  instructions before discharge.      **Procedure Codes**    ---      [address]  Trigger Point Inject >= 3 musc, Modifiers: RT    ---     **Follow Up**    ---    2 Months    Electronically signed by [name] on 11/03/2019 at 09:14 AM EDT    Sign off status: Completed        * * *        Physical Medicine and Rehab    [address]    [place] [address]    [place], [place] [address]    Tel: [phone]    Fax: [phone]              * * *          Progress Note: Muaz Shorey [facility] 11/03/2019    ---  Note generated by eClinicalWorks EMR/PM Software ([email or web address])

## 2019-11-03 NOTE — Progress Notes (Signed)
 .  Progress Notes  .  Patient: [name], [name] [facility]  Provider: Cora [name]    .  DOB: 05-03-1960 Age: 59 Y Sex: Female  .  PCP: [name]    Date: 11/03/2019  .  --------------------------------------------------------------------------------  .  REASON FOR APPOINTMENT  .  1. 83M FU RT HIP  .  HISTORY OF PRESENT ILLNESS  .  General:   [name] for follow-up of right sided buttock and lateral  leg pain. She was last seen on 09-02-2019 at which time, we did  right ITB hydrodissection under ultrasound guidance. She reports  having a few days of relief before pain returned. In the interim,  she saw [name] at [facility], who thinks she may  have a component of lumbar radicular pain and she is scheduled  for a spinal intervention on August 9. She has developed new pain  down to the lateral calf, which is what has increased his  suspicion. She also saw [name] at [facility])  on recommendation from [name] and was not told there was  anything to report. Pain is graded 6/10. She has been using the  compound cream, which takes the burning sensation away. She  denies any weakness, numbness or tingling into the right leg. She  denies any new bowel or bladder dysfunction.  .  CURRENT MEDICATIONS  .  Taking Acetaminophen 500 MG Tablet 2 tablets as needed Orally  every 8 hrs, Notes: as needed  Taking Baclofen 10 MG Tablet 1/2 tab Orally prn, Notes: prn  Taking Calcium Carbonate-Vitamin D 600-200 MG-UNIT Capsule 1  capsule with a meal Orally Twice a day  Taking Famotidine 40 MG Tablet 1 tablet at bedtime Orally Once a  day, Notes: prn  Taking Gammagard - Solution as directed Injection once every 4  weeks, Notes: every 4 weeks  Taking glipiZIDE ER 5 MG Tablet Extended Release 24 Hour TAKE 1  TABLET BY MOUTH EVERY DAY  Taking Glucophage XR 500 mg Tablet Extended Release 24 Hour 4  tablets Orally Once a day  Taking Hydroxychloroquine Sulfate 200 MG Tablet 1 tablet with  food or milk Orally  Once a day  Taking Jardiance 10 MG Tablet 1 tablet Orally Once a day  Taking Lidocaine 5 % Patch 1 patch remove after 12 hours  Externally Once a day, Notes: prn  Taking Lisinopril 2.5 MG Tablet 1 tablet Orally Once a day  Taking Mycophenolate Mofetil 500 MG Tablet 2 tablet Orally twice  daily  Taking Omeprazole 40 mg Capsule Delayed Release 1 capsule Orally  Once a day  Taking oxyCODONE HCl 5 MG Tablet 1 tablet as needed Orally every  4-6 hrs, Notes: prn  Taking traMADol HCl 50 MG Tablet 1 tablet Orally every 6 hrs,  Notes: PRN - rarely takes  Taking Ursodiol 500 MG Tablet 1 tablet Orally Twice a day  Taking Vitamin B Complex - Tablet 1 tablet Orally once daily  Taking Vitamin D 25 MCG (1000 UT) Tablet 1 tablet Orally Once a  day  Not-Taking/PRN glipiZIDE XL 5 MG Tablet Extended Release 24 Hour  1 tablet Orally Once a day  Not-Taking/PRN MiraLax 17 GM Packet 1 packet mixed with 8 ounces  of fluid Orally Once a day  Not-Taking/PRN predniSONE 10 mg Tablet 1 tablet Orally Once a day  Not-Taking/PRN Rituxan 500 MG/50ML Solution as directed  Intravenous , Notes: Last dose 05/19/19  .  PAST MEDICAL HISTORY  .  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 Katrinka Blazing 2010--- never infected, but  packed with antibiotics with surgery;  Right rotator cuff repairs x 4, complicated by repeat tears,  infection, placement of anchor material  Elevated liver function tests  Diabetes  Seronegative RA  GI bleed  GERD  Pagets disease  Stage II fibrosis of Liver  .  ALLERGIES  .  N.K.D.A.  .  SURGICAL  HISTORY  .  Right rotator cuff repair, with infected hardware that had to be  removed 2006  Repeat right shoulder surgery, also which became infected. 2007  Left rotator cuff repair 2008  bilateral knee replacements 2009  Left arthroscopic carpal tunnel release 01/2011  ORIF Left 4th metatarsal bone  Left Ulna shortening 1994  Knee replacement 2010  Hand surgery 2013, 2015  remove gallbladder/hernia 1990  Plate left foot 3rd metatarsal 2008  Left reverse TSA 03/07/16  Left Hamstring Repair 05/2019  .  FAMILY HISTORY  .  Mother: deceased 34 yrs, lung cancer, hyperthyroidism, diagnosed  with Other malignant neoplasm of unspecified site  Father: deceased 37s yrs, heart attack, Unspecified heart disease  Siblings: alive, Brother - scleroderma, COPD  3 brother(s) .  Denies family history of GI cancer or liver disease.  .  SOCIAL HISTORY  .  .  Tobacco  history: Never smoked.  .  Marital Status Single.  .  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  .  Alcohol Former daily EtOH use in 20s.  .  Abuse/NeglectDo you feel unsafe in your relationships?No ,  PlanResources Provided, Patient states they feel safe to return  home , Have you ever been hit, kicked, punched or otherwise hurt  by someone in the past year? No.  .  Illicit drugs: Denies.  [name]  HOSPITALIZATION/MAJOR DIAGNOSTIC PROCEDURE  .  as above  GI bleed 05/2019  .  REVIEW OF SYSTEMS  .  ORT:  .  Eyes    No . Ear, Nose Throat    No . Digestion, Stomach, Bowel     No . Bladder Problems    No . Bleeding Problems    No .  Numbness/Tingling    No . Anxiety/Depression    No .  Fever/Chills/Fatigue    No . Chest Pain/Tightness/Palpitations     No . Skin Rash    No . Dental Problems    No . Joint/Muscle  Pain/Cramps    Yes . Blackout/Fainting    No . Other    No .  .  VITAL SIGNS  .  Pain scale 6, Ht-in 62, Ht-cm 157.48.  [name]  PHYSICAL EXAMINATION  .  General: NAD Psych: Mood and affect appropriate HEENT: NC/AT CV:  warm and well perfused Pulm: breathing unlabored,  normal chest  expansions Skin: No rash, swelling, ecchymoses, or erythemaGait:  not antalgic; able to heel, toe and tandem walk(+) TTP over right  SIJ, gluteus maximus, right piriformis, right TFL, GTB; left  gluteus maximus5/5 motor strength B/L except 4-/5 hip abductors  on rightSensation intact to  light touch in all dermatomesReflex  1+ B/L patellar and Achilles(-) Gaenslen's test(-) SIJ  compression testNo pain on transition from sitting to standing,  standing to sitting.  .  ASSESSMENTS  .  Trochanteric bursitis, right hip - M70.61 (Primary)  .  Iliotibial band syndrome of right side - M76.31  .  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  Myofascial pain - M79.18  .  [name] is a 59 year old female with left proximal  hamstring tear fixed surgically, who presents with persistent  right buttock and lateral leg pain. Her history and physical  examination are most consistent with myofascial pain coming from  gluteus maximus, piriformis and tight ITB/TFL which improves with  trigger point injections, but only temporarily.-Pt educated  regarding diagnosises and treatment options -Imaging reviewed and  discussed with pt -Tiers of treatment discussed -Continue PT, HEP  as tolerated-Continue dry needling PRN-Myofascial trigger point  injectiond performed today, see procedure note below -Cool  compresses or warm heat to affected regions prn-Continue compound  cream PRN -Continue f/u with [name] Cecile Sheerer at [facility] for interventions  as recommended-Discussed red flag symptoms such as focal  neurologic deficit, bowel or bladder dysfunction and to report to  ED if they develop these symptoms-Follow up in 8 wksA total of 30  minutes was spent preparing for and during this encounter  including: review of imaging, performing medically appropriate  examination, counseling/education regarding treatment  plans/injections and clinical documentation into the electronic  health record.  [name]  PROCEDURES  .  Myofascial trigger point  injectionsProcedure  performed by: Patrick North, DOInjectate: 10ml total volume  containing 4ml bupivacaine 0.25% with 6ml lidocaine 1% without  epinephrineAfter risks, benefits and alternative treatment  options and prognosis were discussed, the patient signed informed  consent. The patient's upper back was examined for trigger  points, with 4 total points found in the following muscles: right  gluteus maximus, piriformis, TFL, GTB. These trigger points were  cleaned and prepped using alcohol. A 25G 1.5" needle was then  inserted and after negative aspiration, the above injectate was  delivered with subsequent dry needling at each identified. The  patient tolerated the procedure well and there were no  complications, she was given post-procedure instructions before  discharge.  [name]  PROCEDURE CODES  .  16109 Trigger Point Inject >= 3 musc, Modifiers: RT  .  FOLLOW UP  .  2 Months  .  Electronically signed by [name] on 11/03/2019  at 09:14 AM EDT  .  Document electronically signed by [name]    .

## 2019-11-05 ENCOUNTER — Ambulatory Visit: Admitting: Rheumatology

## 2019-11-05 NOTE — Progress Notes (Signed)
* * *      [name], [name] **DOB:** June 19, 1960 (59 yo F) **Acc No.** [id number] **DOS:**  11/05/2019    ---       [name], [name]**    ------    77 Y old Female, DOB: September 11, 1960    [address], [place], [place] [address]    Home: [phone]    Provider: [name]        * * *    Telephone Encounter    ---    Answered by  Denton Meek Date: 11/05/2019       Time: 12:58 PM    Reason  Update [name]    ------            Message                     1.  Had barium swallow      2.  Spot on leg-getting worse.      3.  Knee swelling      4.  Fx in pelvis? says ortho guy      5.  Getting sciatica injection Tuesday-will let us know how it goes.                Action Taken                     [facility]  11/05/2019 1:01:14 PM > Updated Dr. Lorella [name].  Pt will call with update after injection.                    * * *                ---          * * *         Provider: [name] 11/05/2019    ---    Note generated by eClinicalWorks EMR/PM Software ([email or web address])

## 2019-11-06 ENCOUNTER — Ambulatory Visit

## 2019-11-06 ENCOUNTER — Ambulatory Visit: Admitting: Internal Medicine

## 2019-11-06 ENCOUNTER — Ambulatory Visit: Admit: 2019-11-06

## 2019-11-06 LAB — HX POINT OF CARE: HX HGB A1C, POC: 5.8 % — ABNORMAL HIGH

## 2019-11-06 NOTE — Progress Notes (Signed)
 General Medicine Visit - Resident note with preceptor addendum    DIAB EYE EX, HGBA1C or HGBA1C%POC.      * [place]: [name])    Patient Details and Vitals  Patient reviewed in/by:  Office  Patient Best Phone # [phone]  Patient Email Address [name]    BMI: 28.65    BP: 102/ 75  Ht (inches): 62.5  Weight: 158.6  BMI: 28.65  Temp: 98.8  Pulse: 87    Med List: PRINTED by [facility] for patient   hd_medl: printed      Patient Medical History   Travel outside of the [place] in past 28 days:: No    In the past year, have you ... Had no falls  Difficulty with balance? NO  Use a Cane NO  Use a Walker NO  Need assistance with ambulation while here? NO      COVID Patient Symptom Screening   Note any symptoms in the prior 10 days:   In past 14 days, exposure to anyone with above symptoms or positive test?   COVID-19 Exposure: No  In past 14 days, told by public health authoirty or healthcare professional to quarantine?   COVID-19 Quarantine: No  COVID Vaccine; remind availability for walk-in vaccines. If NO, patient must wear mask..   Been vaccinated for COVID-19: Yes  Tobacco use? never smoker  Does patient experience chronic pain ? YES  Severity of pain? (min=0, max=10) 8    Patient Screening     SDOH: Housing situation: I have housing  SDOH: Run out or ability to purchase food: Never true  SDOH: Utility shutoff: No  SDOH: Miss appt lack transportation: No  SDOH: unemployed or looking for work: No  SDOH: Socially withdrawn: Never        PHQ2 (Q1) Little Interest or pleasure in doing things: 0  PHQ2 (Q2) Feeling down, depressed or hopeless: 0  PHQ2 Score: 0    Abuse/Neglect (Q1) Feel unsafe in relationship: NO  Abuse/Neglect (Q2) Hurt in past year: NO            Data to be shared to Telemed/Office Visits   November 06, 2019 2:49 PM  Screening [place]: Johny Drilling [name]  Chronic Pain, level = 8  PHQ2 Score: 0    ---------- ---------- ----------        .....................................Marland KitchenMarland KitchenAdline Peals  November 06, 2019 2:49  PM      **Resident Note**      Patient Prep Updates   [place] Pre-Visit Notes:  November 06, 2019 2:49 PM  Screening [place]: Johny Drilling [name]  Chronic Pain, level = 8  PHQ2 Score: 0    ---------- ---------- ----------                   * Preceptor: Massie Maroon)  CC: Multiple issues    HPI: GASTROPARESIS  - cannot eat a lot in one sitting because she feels "full" and gets abdominal pain irrespective of how much or what she eats over LUQ and epigastrum 10 minutes after eating  - performed an emptying test with [facility] with Dr. Shawnee Knapp that shows prolonged emptying (142 minutes)  - added Metamucil, notices improvement in stooling but continues to feel incomplete voiding  - gets vomiting or diarrhea every 10-14 days but over the past 6 weeks has only vomited twice; initially thought it was related to food intake (i.e. eggs)  - has bowel movements 3-4 times daily at baseline  - denies hematochezia, hematemesis, melena,  no rectal pain with bowel movements, no abdominal pain outside  - Plans to follow-up with GI doctors    CHRONIC PAIN  - Pain that radiates along the IT band, previously burns but now feels that it is going to tear; constant, standing more than a few moments is unbearable  - radiates across glutes and along the IT band on the right side; started noting an ache in her right lower extremity  - has noticed that right foot will intermittently turn out, recently had swollen right knee three weeks   - no recent trauma  - PT felt that possibly she has weakened muscles and feels that she needs strengthening  - swims, rides bike, but feels walking is limited due to pain though  - attends physical therapy, follows with numerous doctors - going to be an injection into her back to see if it improves  - takes Gabapentin at night with some relief      Past Medical History:(reviewed)  HEALTH CARE MAINTENANCE  COVID vaccinated  STD- low risk, deferred  Pap smear - 01/17/2019- nml neg HPV  Mammogram - normal 03/2017. normal 02/01/2018.  schedule 09/2019  Colonoscopy -  done 2016, repeat 2017 - both times poor prep but overall has been sufficient for screening purposes. In combination, these two colonoscopies have likely given an adequate colon cancer screening. Recommend repeat colonoscopy in 3 years.  1 polyp removed, next colonoscopy in 3 yrs (2020). Colonscopy 05/2019 (at [facility] when had GIB) no polyps removed)   DEXA (>17yr) - 2017 normal bone mass; on chronic prednisone use. 02/01/2018 nml bone mass, next due 2022  Lung cancer screening (55-80)- N/A  Influenza - annually  HPV - N/A  Hep C (4034-7425) - non reactive 2017  Shingles shot - recommended she get Shingrix when/where it's available  Pneumonia - PPSV23 01/26/2017. Due for booster at age 49.  Tetanus - TDAP 11/04/2014  Contraception - post menopausal  LDL - 42 (06/2019)  HGBA1C - 6.5 (06/2019)    Family History: (reviewed)   Father: DMII, died of MI at 59  Mom: died of lung CA at 49  MGM and PGM: lung ca (smokers)  MGF and several other second degree relatives: brain aneurysms in their 52's.  2 maternal uncles:melanoma (one died of unknown cancer, one living)  brother recently diagnosed with systemic scleroderma c/b gangrene of fingers    Social History: (reviewed)   Non-smoker, rare etoh, no IVDU. 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 Separated from life partner of 20 years jan 2020 and [place]. No children. Always wears a seat belt.    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.1)  SCREENING EXAM FOR BREAST CANCER  (ICD-V76.10) (ICD10-Z12.39)  ANEMIA, OTHER (ICD-285.8) (ICD10-D64.89)  LOWER EXTREMITY EDEMA (ICD-782.3) (ICD10-R60.0)  IRREGULAR HEART BEATS (ICD-427.9) (ICD10-I49.9)  GI BLEED (ICD-578.9) (PPI95-J88.2)  SCLERODERMA (ICD-710.1) (ICD10-M34.9)  SCREENING FOR CERVICAL CANCER (ICD-V76.2) (CZY60-Y30.4)  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) (ICD10-M47.812)  NECK PAIN (ICD-723.1) (ICD10-M54.2)  STEROID USE, LONG TERM (ICD-V58.65) (ICD10-Z79.52)  PREMATURE VENTRICULAR CONTRACTIONS (ICD-427.69) (ICD10-I49.3)  HYPERLIPIDEMIA (ICD-272.4) (ICD10-E78.5)  BACK PAIN, LUMBAR, CHRONIC (ICD-724.2) (ICD10-M54.5)  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)  DIABETES MELLITUS, TYPE II (ICD-250.00) (ICD10-E11.9)  LONG-TERM (CURRENT) USE OF STEROIDS (ICD-V58.65) (ICD10-Z79.51)  SKIN SAGGING DUE TO WEIGHT LOSS (ICD-757.9) (ICD10-Q84.9)  TRANSAMINASES, SERUM, ELEVATED (ICD-790.4) (ICD10-R74.0)  OBESITY, BMI 30-34.9, ADULT (ICD-278.00) (ICD10-E66.9)      DIABETES MELLITUS, TYPE II, UNCONTROLLED (ICD-250.02) (ICD10-E11.65)      FATTY LIVER DISEASE (ICD-571.8) (ICD10-K76.0)  SCLERODERMA, 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)  HYPOTHYROIDISM (ICD-244.9) (ICD10-E03.9)  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) (ICD10-D80.8)  SPINAL STENOSIS, 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)  HEPATITIS C EXPOSURE (HCV RNA NEGATIVE, 01/2014) (ICD-V02.62) (ICD10-Z20.5)  PAIN IN JOINT, HAND (ICD-719.44) (ICD10-M79.643)  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)  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)  CARPAL TUNNEL (ICD-354.0) (ICD10-G56.00)  OSTEOARTHRITIS (ICD-715.09) (ICD10-M15.9)  S/P ROTATOR CUFF SURGERY 12/2004 - RT SHOULDER; 2008 LT (ICD-V45.89)  Family Hx of MELANOMA, FAMILY HX (ICD-V16.8) (ICD10-Z80.8)  FRACTURE OF OTHER SPEC SITE,  PATHOLOGIC - MULTIPLE (ICD-733.19)  CHOLECYSTECTOMY AND HERNIA REPAIR (ICD-V45.89)  VITAMIN D DEFICIENCY (ICD-268.9) (ICD10-E55.9)  COLONOSCOPY, NEXT 2020- SEE COMMENT (ICD-V76.51)  (ICD10-Z01.89)  PROBLEM CHANGES:  Added new problem of 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) (VFI43-P29.51) - Signed  Added new problem of RUPTURE OF MUSCLE (ICD-848.9) (ICD10-M62.18) - Signed  Added new problem of ANNUAL EXAM (18+) (ICD-V70.0) (ICD10-Z00.00) - Signed         MEDICATIONS:   PAST MEDICINES (prior to today's visit):  HYDROXYCHLOROQUINE SULFATE 200 MG ORAL TABLET (HYDROXYCHLOROQUINE SULFATE) one tablet oral daily; Route: ORAL  JARDIANCE 10 MG ORAL TABLET (EMPAGLIFLOZIN) Take 1 tablet by mouth once daily; Route: ORAL  ROSUVASTATIN 10MG  TABLETS (ROSUVASTATIN CALCIUM) TAKE 1 TABLET BY MOUTH EVERY DAY  LISINOPRIL 2.5MG  TABLETS (LISINOPRIL) TAKE 1 TABLET BY MOUTH ONCE DAILY  METFORMIN HCL ER 500 MG XR24H-TAB (METFORMIN HCL) TAKE 4 TABLETS BY MOUTH EVERY MORNING  GLIPIZIDE ER 2.5 MG XR24H-TAB (GLIPIZIDE) TAKE 1 TABLET BY MOUTH DAILY  OMEPRAZOLE 20 MG ORAL CAPSULE DELAYED RELEASE (OMEPRAZOLE) Take one capsule by mouth twice a day; Route: ORAL  BACLOFEN 10MG  TABLETS (BACLOFEN) TAKE 1/2 TABLET BY MOUTH EVERY DAY AS NEEDED FOR BACK OR SPASMS  DICLOFENAC SODIUM 1 % TRANSDERMAL GEL (DICLOFENAC SODIUM) APP 2 GRAMS EXT AA BID      FREESTYLE FREEDOM LITE w/Device KIT (BLOOD GLUCOSE MONITORING SUPPL) use as directed (ICD 10 E11.65)      FREESTYLE LITE BLOOD GLUCOSE STRIPS (GLUCOSE BLOOD) USE TO CHECK BLOOD GLUCOSE THREE TIMES DAILY      FREESTYLE LANCETS (LANCETS) check fingerstick three times a day (ICD 10 E11.65)  TRAMADOL HCL 50 MG ORAL TABLET (TRAMADOL HCL) take one tablet daily as needed for pain; Route: ORAL  OXYCODONE HCL 5 MG ORAL TABLET (OXYCODONE HCL) Take one tablet daily as needed for pain >8. Partial fill upon patient request.; Route: ORAL  CALCIUM CARBONATE-VITAMIN D 600-200 MG-UNIT ORAL TABLET (CALCIUM CARBONATE-VITAMIN D) take one tablet twice a day; Route: ORAL  GABAPENTIN 300MG  CAPSULES (GABAPENTIN) TAKE 1 CAPSULE BY MOUTH EVERY NIGHT AT BEDTIME AS NEEDED FOR PAIN  * BLOODWORK Please draw chemistry (sodium,  potassium, magnesium, chloride, bicarbonate, glucose, creatinine, BUN), CBC, TSH, lipids, HbA1c. Fax result to [phone] Dr. Marlane Hatcher  PREDNISONE 10 MG ORAL TABLET (PREDNISONE) Take 3 tablets once a day; Route: ORAL  FAMOTIDINE 20 MG TABLET (FAMOTIDINE) TAKE 1 TABLET BY MOUTH AT NIGHT AS NEEDED  LIDODERM 5 % EXTERNAL PATCH (LIDOCAINE) Apply one patch to back once daily  as needed for pain, Remove after 12 hrs; Route: EXTERNAL  MYCOPHENOLATE MOFETIL 500 MG ORAL TABLET (MYCOPHENOLATE  MOFETIL) Take 2 tablets twice daily; Route: ORAL    MEDICINE CHANGES:  Removed medication of PREDNISONE 10 MG ORAL TABLET (PREDNISONE) Take 3 tablets once a day; Route: ORAL - Signed  Added new medication of GAMMAGARD 20 GM/200ML INJECTION SOLUTION (IMMUNE GLOBULIN (HUMAN)) Receives infusion every 4 weeks, next dose 8/5; Route: INJECTION - Signed  Added new medication of URSODIOL 500 MG ORAL TABLET (URSODIOL) Take 1 tablet twice daily; Route: ORAL - Signed     Medications Reviewed:  Done      ALLERGIES:      Allergies Reviewed:  Done      DIRECTIVES:         Vitals:   BP: 102 / 75 mmHg Ht: 62.5 in.  Wt: 158.6 lbs.  BMI (in-lb) 28.65  Temp: 98.8deg F.   Pulse Rate: 87 bpm      Additional PE: General: older than stated age, pleasant and conversant  CV: RRR, normal S1/S2/ no m/r/g  Pulm: CTAB, no increased work of breathing  Back: no tenderness over spinous processes, spastic gluteal muscles  Ext: scars over bilateral knee, tense left IT band palpated and non-tender, point tenderness over insertion site, varicose veins bilateral lower extremities      Assessment & Plan:   Ms. Lint is a 59 year old woman with a complicated medical history most notable for slceroderma (CREST), IgG4 deficiency, recent GI bleed due to diverticulosis, T2DM, and most recently gastroparesis and acute on chronic right leg pain.    #)Gastroparesis  Has good follow up with Dr. Shawnee Knapp in GI clinic [facility] at [facility].  Plans to make an appointment regarding recent emptying  study that is consistent with slow gastric emptying.  Likely a consequence of her diabetes.  Provided encouragement and listened to her struggles.    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  She has an appointment with her pain doctor for an spinal injection on 8/10, which I encouraged.  We will re-visit in 3 months to discuss any improvement and if residual pain will try increasing gabapentin.  She is well followed by rheumatology, neurosurgery, PM&R, pain clinic, and PT.  Provided reassurance.   - encouraged current follow-up with her specialist  - consider increasing gabapentin dose if residual pain following injection  - continue current PT regimen, exercise regimen, and stretching     #)Annual exam - plan to follow-up in 3 months with an annual exam        * Resident Signature (.sign): ......................................Marland KitchenTomma Lightning MD -PC 4B-  November 06, 2019 4:55 PM        ORDERS:  POC A1C test (done in office) [phone]) [[id number]]  RTC in 3 months [RTC-090]  Est Level 4 (MDM or 30-39 min) [[id number]]  GC - Res+Preceptor [Mod-GC]      **Preceptor Note**     Resident: Nicholes Stairs Hunt[place] Dec)  Problem Follow-up      Subjective   Patient is a 59 Years Old Female who presents to clinic # GIB: Follows with Dr. Shawnee Knapp. No further hematochezia, melena. Gastric emptying study showed gastroparesis i/s/o known DM. Hasn't seen Dr. Shawnee Knapp after that study.    # Pain over R IT band region, starts in R gluteal muscle and wraps around laterally and down, doesn't go below knee. She sees Neurosurgery, Ortho, Pain Clinic for her chronic pain. She will be having  a sacral injection tomorrow for this.    I discussed the patient with Dr. Nicholes Stairs [place] Dec) in a routine precepting encounter.  I also personally interviewed and examined the patient.  I agree with the patient's diagnosis and  management.     Discussed Services Due: Yes    Objective     Assessment   68F with gastroparesis, chronic pain, here to establish care with Dr. Nicholes Stairs.    Plan   # Gastroparesis: F/u with Dr. Shawnee Knapp  # Chronic pain: Keep appt with Pain Clinic for injection  # DM2: Consider stopping glipizide given how well controlled A1c is  # RHM: RTC for annual   See resident note for details, Follow-up per resident note          Patient Care Plan        Immunization Worksheet 2019 (rev 07/09/2019)                             Follow-up With:           Created By [name] on 11/06/2019 at 02:47 PM    Electronically Signed By Massie Maroon MD on 11/07/2019 at 01:50 PM

## 2019-11-06 NOTE — Progress Notes (Signed)
 General Medicine Visit - Resident note with preceptor addendum  .  DIAB EYE EX, HGBA1C or HGBA1C%POC.    .  * [place]: [name])  .  Patient Details and Vitals  Patient reviewed in/by:  Office  Patient Best Phone # [phone]  Patient Email Address [name]  .  BMI: 28.65  .  BP: 102/ 75  Ht (inches): 62.5  Weight: 158.6  BMI: 28.65  Temp: 98.8  Pulse: 87  .  Med List: PRINTED by [employer] for patient   hd_medl: printed  .  [name]  Patient Medical History   Travel outside of the [place] in past 28 days:: No  .  In the past year, have you ... Had no falls  Difficulty with balance? NO  Use a Cane NO  Use a Walker NO  Need assistance with ambulation while here? NO  .  [name]  COVID Patient Symptom Screening   Note any symptoms in the prior 10 days:   In past 14 days, exposure to anyone with above symptoms or positive test?   COVID-19 Exposure: No  In past 14 days, told by public health authoirty or healthcare professional   to quarantine?   COVID-19 Quarantine: No  COVID Vaccine; remind availability for walk-in vaccines. If NO, patient mus  t wear mask..   Been vaccinated for COVID-19: Yes  Tobacco use? never smoker  Does patient experience chronic pain ? YES  Severity of pain? (min=0, max=10) 8  .  Patient Screening   .  SDOH: Housing situation: I have housing  SDOH: Run out or ability to purchase food: Never true  SDOH: Utility shutoff: No  SDOH: Miss appt lack transportation: No  SDOH: unemployed or looking for work: No  SDOH: Socially withdrawn: Never  .  .  .  PHQ2 (Q1) Little Interest or pleasure in doing things: 0  PHQ2 (Q2) Feeling down, depressed or hopeless: 0  PHQ2 Score: 0  .  Abuse/Neglect (Q1) Feel unsafe in relationship: NO  Abuse/Neglect (Q2) Hurt in past year: NO  .  .  .  .  .  Data to be shared to Telemed/Office Visits   November 06, 2019 2:49 PM  Screening [place]: Johny Drilling [name]  Chronic Pain, level = 8  PHQ2 Score: 0  .  ---------- ---------- ----------   .  .....................................Marland KitchenMarland KitchenAdline [name]   November 06, 2019 2:49 PM  .  .  **Resident Note**  .  .  Patient Prep Updates   [place] Pre-Visit Notes:  November 06, 2019 2:49 PM  Screening : Johny Drilling [name]  Chronic Pain, level = 8  PHQ2 Score: 0  .  ---------- ---------- ----------   .  .  .  .  .  .  .  .  * Preceptor: Renaldo Reel Belenda Cruise)  CC: Multiple issues  .  HPI: GASTROPARESIS  - cannot eat a lot in one sitting because she feels "full" and gets abdomin  al pain irrespective of how much or what she eats over LUQ and epigastrum 1  0 minutes after eating  - performed an emptying test with [facility] with Dr. Shawnee Knapp that shows prolong  ed emptying (142 minutes)  - added Metamucil, notices improvement in stooling but continues to feel in  complete voiding  - gets vomiting or diarrhea every 10-14 days but over the past 6 weeks has   only vomited twice; initially thought it was related to food intake (i.e. e  ggs)  - has  bowel movements 3-4 times daily at baseline  - denies hematochezia, hematemesis, melena, no rectal pain with bowel movem  ents, no abdominal pain outside  - Plans to follow-up with GI doctors  .  CHRONIC PAIN  - Pain that radiates along the IT band, previously burns but now feels that   it is going to tear; constant, standing more than a few moments is unbeara  ble  - radiates across glutes and along the IT band on the right side; started n  oting an ache in her right lower extremity  - has noticed that right foot will intermittently turn out, recently had sw  ollen right knee three weeks   - no recent trauma  - PT felt that possibly she has weakened muscles and feels that she needs s  trengthening  - swims, rides bike, but feels walking is limited due to pain though  - attends physical therapy, follows with numerous doctors - going to be an   injection into her back to see if it improves  - takes Gabapentin at night with some relief  .  [name]  Past Medical History:(reviewed)  HEALTH CARE MAINTENANCE  COVID vaccinated  STD- low risk, deferred  Pap smear -  01/17/2019- nml neg HPV  Mammogram - normal 03/2017. normal 02/01/2018. schedule 09/2019  Colonoscopy -  done 2016, repeat 2017 and both times poor prep but overall   has been sufficient for screening purposes. In combination, these two colon  oscopies have likely given an adequate colon cancer screening. Recommend re  peat colonoscopy in 3 years.  1 polyp removed, next colonoscopy in 3 yrs (2  020). Colonscopy 05/2019 (at [facility] when had GIB) no polyp  s removed)   DEXA (>22yr) - 2017 normal bone mass; on chronic prednisone use. 02/01/2018   nml bone mass, next due 2022  Lung cancer screening (55-80)- N/A  Influenza - annually  HPV - N/A  Hep C (5573-2202) - non reactive 2017  Shingles shot - recommended she get Shingrix when/where it's available  Pneumonia - PPSV23 01/26/2017. Due for booster at age 14.  Tetanus and TDAP 11/04/2014  Contraception - post menopausal  LDL - 42 (06/2019)  HGBA1C - 6.5 (06/2019)  .  Family History: (reviewed)   Father: DMII, died of MI at 82  Mom: died of lung CA at 34  MGM and PGM: lung ca (smokers)  MGF and several other second degree relatives: brain aneurysms in their 44'  s.  2 maternal uncles:melanoma (one died of unknown cancer, one living)  brother recently diagnosed with systemic scleroderma c/b gangrene of fingers  .  Social History: (reviewed)   Non-smoker, rare etoh, no IVDU. 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 Separated from life partner of 20 years jan 20  20 and now [place] move [place]. No children. Always wears a seat belt.  .  Review of Systems: Negative except as noted in HPI  .  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-Q21.1)  SCREENING EXAM FOR BREAST CANCER (ICD-V76.10) (ICD10-Z12.39)  ANEMIA, OTHER (ICD-285.8) (ICD10-D64.89)  LOWER EXTREMITY EDEMA (ICD-782.3) (ICD10-R60.0)  IRREGULAR HEART BEATS (ICD-427.9) (ICD10-I49.9)  GI BLEED (ICD-578.9) (SEG31-D17.6)  SCLERODERMA (ICD-710.1) (ICD10-M34.9)  SCREENING FOR CERVICAL CANCER (ICD-V76.2) (ZOX09-U04.4)  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) (ICD10-M47.812)  NECK PAIN (ICD-723.1) (ICD10-M54.2)  STEROID USE, LONG TERM (ICD-V58.65) (ICD10-Z79.52)  PREMATURE VENTRICULAR CONTRACTIONS (ICD-427.69) (ICD10-I49.3)  HYPERLIPIDEMIA (ICD-272.4) (ICD10-E78.5)  BACK PAIN, LUMBAR, CHRONIC (ICD-724.2) (ICD10-M54.5)  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)  DIABETES MELLITUS, TYPE II (ICD-250.00) (ICD10-E11.9)  LONG-TERM (CURRENT) USE OF STEROIDS (ICD-V58.65) (ICD10-Z79.51)  SKIN SAGGING DUE TO WEIGHT LOSS (ICD-757.9) (ICD10-Q84.9)  TRANSAMINASES, SERUM, ELEVATED (ICD-790.4) (ICD10-R74.0)  OBESITY, BMI 30-34.9, ADULT (ICD-278.00) (ICD10-E66.9)      DIABETES MELLITUS, TYPE II, UNCONTROLLED (ICD-250.02) (ICD10-E11.65)      FATTY LIVER DISEASE (ICD-571.8) (ICD10-K76.0)  SCLERODERMA, 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)  HYPOTHYROIDISM (ICD-244.9) (ICD10-E03.9)  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) (ICD10-D  80.8)  SPINAL STENOSIS, 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)  HEPATITIS C EXPOSURE (HCV RNA NEGATIVE, 01/2014) (ICD-V02.62) (ICD10-Z20.5)  PAIN IN JOINT, HAND (ICD-719.44) (ICD10-M79.643)  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)  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)  CARPAL TUNNEL (ICD-354.0) (ICD10-G56.00)  OSTEOARTHRITIS (ICD-715.09) (ICD10-M15.9)  S/P ROTATOR CUFF SURGERY 12/2004 - RT SHOULDER; 2008 LT (ICD-V45.89)  Family Hx of MELANOMA, FAMILY HX (ICD-V16.8) (ICD10-Z80.8)  FRACTURE OF OTHER SPEC SITE,  PATHOLOGIC - MULTIPLE (ICD-733.19)  CHOLECYSTECTOMY AND HERNIA REPAIR (ICD-V45.89)  VITAMIN D DEFICIENCY  (ICD-268.9) (ICD10-E55.9)  COLONOSCOPY, NEXT 2020- SEE COMMENT (ICD-V76.51) (ICD10-Z01.89)  PROBLEM CHANGES:  Added new problem of 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) (VWU98-J19.14) - Signed  Added new problem of RUPTURE OF MUSCLE (ICD-848.9) (ICD10-M62.18) - Signed  Added new problem of ANNUAL EXAM (18+) (ICD-V70.0) (ICD10-Z00.00) - Signed  .  [name]  MEDICATIONS:   PAST MEDICINES (prior to today's visit):  HYDROXYCHLOROQUINE SULFATE 200 MG ORAL TABLET (HYDROXYCHLOROQUINE SULFATE)   one tablet oral daily; Route: ORAL  JARDIANCE 10 MG ORAL TABLET (EMPAGLIFLOZIN) Take 1 tablet by mouth once dai  ly; Route: ORAL  ROSUVASTATIN 10MG  TABLETS (ROSUVASTATIN CALCIUM) TAKE 1 TABLET BY MOUTH EVE  RY DAY  LISINOPRIL 2.5MG  TABLETS (LISINOPRIL) TAKE 1 TABLET BY MOUTH ONCE DAILY  METFORMIN HCL ER 500 MG XR24H-TAB (METFORMIN HCL) TAKE 4 TABLETS BY MOUTH E  VERY MORNING  GLIPIZIDE ER 2.5 MG XR24H-TAB (GLIPIZIDE) TAKE 1 TABLET BY MOUTH DAILY  OMEPRAZOLE 20 MG ORAL CAPSULE DELAYED RELEASE (OMEPRAZOLE) Take one capsule   by mouth twice a day; Route: ORAL  BACLOFEN 10MG  TABLETS (BACLOFEN) TAKE 1/2 TABLET BY MOUTH EVERY DAY AS NEED  ED FOR BACK OR SPASMS  DICLOFENAC SODIUM 1 % TRANSDERMAL GEL (DICLOFENAC SODIUM) APP 2 GRAMS EXT A  A BID      FREESTYLE FREEDOM LITE w/Device KIT (BLOOD GLUCOSE MONITORING SUPPL) [place]  e as directed (ICD 10 E11.65)      FREESTYLE LITE BLOOD GLUCOSE STRIPS (GLUCOSE BLOOD) USE TO CHECK BLOOD   GLUCOSE THREE TIMES DAILY      FREESTYLE LANCETS (LANCETS) check fingerstick three times a day (ICD 10   E11.65)  TRAMADOL HCL 50 MG ORAL TABLET (TRAMADOL HCL) take one tablet daily as need  ed for pain; Route: ORAL  OXYCODONE HCL 5 MG ORAL TABLET (OXYCODONE HCL) Take one tablet daily as nee  ded for pain >8. Partial fill upon patient request.; Route: ORAL  CALCIUM CARBONATE-VITAMIN D 600-200 MG-UNIT ORAL TABLET (CALCIUM CARBONATE-  VITAMIN D) take one tablet twice a day; Route: ORAL  GABAPENTIN 300MG  CAPSULES (GABAPENTIN) TAKE 1  CAPSULE BY MOUTH EVERY NIGHT   AT BEDTIME AS NEEDED FOR PAIN  * BLOODWORK Please draw chemistry (sodium, potassium, magnesium, chloride,   bicarbonate, glucose, creatinine,  BUN), CBC, TSH, lipids, HbA1c. Fax result   to [phone] Dr. Marlane Hatcher  PREDNISONE 10 MG ORAL TABLET (PREDNISONE) Take 3 tablets once a day; Route:   ORAL  FAMOTIDINE 20 MG TABLET (FAMOTIDINE) TAKE 1 TABLET BY MOUTH AT NIGHT AS NEE  DED  LIDODERM 5 % EXTERNAL PATCH (LIDOCAINE) Apply one patch to back once daily    as needed for pain, Remove after 12 hrs; Route: EXTERNAL  MYCOPHENOLATE MOFETIL 500 MG ORAL TABLET (MYCOPHENOLATE MOFETIL) Take 2 tab  lets twice daily; Route: ORAL  .  MEDICINE CHANGES:  Removed medication of PREDNISONE 10 MG ORAL TABLET (PREDNISONE) Take 3 tabl  ets once a day; Route: ORAL - Signed  Added new medication of GAMMAGARD 20 GM/200ML INJECTION SOLUTION (IMMUNE GL  OBULIN (HUMAN)) Receives infusion every 4 weeks, next dose 8/5; Route: INJE  CTION - Signed  Added new medication of URSODIOL 500 MG ORAL TABLET (URSODIOL) Take 1 table  t twice daily; Route: ORAL - Signed  Medications Reviewed:  Done  .  [name]  ALLERGIES:   Allergies Reviewed:  Done  .  [name]  DIRECTIVES:   .  .  .  Vitals:   BP: 102 / 75 mmHg Ht: 62.5 in.  Wt: 158.6 lbs.  BMI (in-lb) 28.65  Temp: 98.8deg F.   Pulse Rate: 87 bpm  .  .  Additional PE: General: older than stated age, pleasant and conversant  CV: RRR, normal S1/S2/ no m/r/g  Pulm: CTAB, no increased work of breathing  Back: no tenderness over spinous processes, spastic gluteal muscles  Ext: scars over bilateral knee, tense left IT band palpated and non-tender,   point tenderness over insertion site, varicose veins bilateral lower extre  mities  .  [name]  Assessment /T/ Plan:   [name] is a 59 year old woman with a complicated medical history most no  table for slceroderma (CREST), IgG4 deficiency, recent GI bleed due to dive  rticulosis, T2DM, and most recently gastroparesis and acute on chronic righ  t leg  pain.  [name]  #)Gastroparesis  Has good follow up with Dr. Shawnee Knapp in GI clinic here at [facility].  Plans to make   an appointment regarding recent emptying study that is consistent with slo  w gastric emptying.  Likely a consequence of her diabetes.  Provided encour  agement and listened to her struggles.    - encouraged follow-up with GI regarding next steps  .  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  She has an appointment with her pain doc  tor for an spinal injection on 8/10, which I encouraged.  We will re-visit   in 3 months to discuss any improvement and if residual pain will try increa  sing gabapentin.  She is well followed by rheumatology, neurosurgery, PM/T/  R, pain clinic, and PT.  Provided reassurance.   - encouraged current follow-up with her specialist  - consider increasing gabapentin dose if residual pain following injection  - continue current PT regimen, exercise regimen, and stretching   .  #)Annual exam - plan to follow-up in 3 months with an annual exam  .  .  .  * Resident Signature (.sign): ......................................Marland KitchenIvette Loyal MD -PC 4B-  November 06, 2019 4:55 PM  .  .  .  ORDERS:  POC A1C test (done in office) [phone]) [22025427]  RTC in 3 months [RTC-090]  Est Level 4 (MDM or 30-39 min) [EPP-29518]  GC - Res+Preceptor [Mod-GC]  .  [name]  **Preceptor Note**  Resident: Tomma Lightning)  Problem Follow-up  .  [name]  Subjective   Patient is a 59 Years Old Female who presents to clinic # GIB: Follows with   Dr. Shawnee Knapp. No further hematochezia, melena. Gastric emptying study showed g  astroparesis i/s/o known DM. Hasn't seen Dr. Shawnee Knapp after that study.  .  # Pain over R IT band region, starts in R gluteal muscle and wraps around l  aterally and down, doesn't go below knee. She sees Neurosurgery, Ortho, [facility] for her chronic [facility]. She will be having a sacral injection tomorr  ow for this.     I discussed the patient with Dr. Nicholes Stairs Huntley Dec) in a routine precepting en  counter.  I also personally interviewed and examined the patient.  I agree   with the patient's diagnosis and management.   .  Discussed Services Due: Yes  .  Objective   .  Assessment   79F with gastroparesis, chronic pain, here to establish care with Dr. Tawni Pummel.  .  Plan   # Gastroparesis: F/u with Dr. Shawnee Knapp  # Chronic pain: Keep appt with Pain Clinic for injection  # DM2: Consider stopping glipizide given how well controlled A1c is  # RHM: RTC for annual   See resident note for details, Follow-up per resident note  .  .  .  .  Patient Care Plan  .  .  .  Immunization Worksheet 2019 (rev 07/09/2019)   .  .  .  .  .  .  .  .  .  .  .  .  .  Follow-up With:   .  .  [name]  Electronically Signed by Massie Maroon MD on 11/07/2019 at 1:50 PM  ________________________________________________________________________

## 2019-11-10 ENCOUNTER — Ambulatory Visit: Admitting: Student in an Organized Health Care Education/Training Program

## 2019-11-10 ENCOUNTER — Ambulatory Visit

## 2019-11-10 ENCOUNTER — Ambulatory Visit: Admitting: Rheumatology

## 2019-11-10 NOTE — Progress Notes (Signed)
* * *      [name], [name] **DOB:** 06/19/1960 (59 yo F) **Acc No.** [id number] **DOS:**  11/10/2019    ---       [name], [name]**    ------    32 Y old Female, DOB: [birth year]    [address], [place], [place] [address]    Home: [phone]    Provider: Doristine Mango        * * *    Telephone Encounter    ---    Answered by  Doristine Mango Date: 11/10/2019       Time: 10:55 AM    Reason  Gastric emptying result    ------            Message                     Called patient and discussed gastric emptying study result. DM in control so advised minimizing medications that might worsen symtoms, opioids, muscle relaxers as possible. Also discussed low fat diet with smaller meals to minimize symptos.      Alex            Hi [name], the patient is interest in nutrition referral for IBS like symptoms. Can you refer to [name].      Thanks      Alex                Action Taken                     [facility]  11/10/2019 10:57:45 AM >      [name],[name]  11/10/2019 11:40:18 AM > FORM SENT TO NUTRITION / COPIED TO PTS. CHART                    * * *                ---          * * *         Provider: Doristine Mango 11/10/2019    ---    Note generated by eClinicalWorks EMR/PM Software ([email or web address])

## 2019-11-10 NOTE — Progress Notes (Signed)
* * *      ADELAIDA, REINDEL [name] **DOB:** 1960-07-06 (59 yo F) **Acc No.** [id number] **DOS:**  11/10/2019    ---       [name], Milderd [name]**    ------    29 Y old Female, DOB: 03-Sep-1960    [address], [place], [place] [address]    Home: [phone]    Provider: [name]        * * *    Telephone Encounter    ---    Answered by  Denton Meek Date: 11/10/2019       Time: 11:15 AM    Reason  Faxed to insurance    ------            Message                     Claim #[id number]      F# [phone]      Needs barium swallow and bone scan                Action Taken                     [facility]  11/10/2019 11:18:51 AM > Faxed with pt's permission.                     * * *                ---          * * *         Provider: [name] 11/10/2019    ---    Note generated by eClinicalWorks EMR/PM Software ([email or web address])

## 2019-11-17 ENCOUNTER — Ambulatory Visit: Admitting: Rheumatology

## 2019-11-17 NOTE — Progress Notes (Signed)
* * *      [name], [name] **DOB:** [birth year] (59 yo F) **Acc No.** [id number] **DOS:**  11/17/2019    ---       [name], [name]**    ------    77 Y old Female, DOB: 1960-06-02    [address], [place], [place] [address]    Home: [phone]    Provider: [name]        * * *    Telephone Encounter    ---    Answered by  Denton Meek Date: 11/17/2019       Time: 12:56 PM    Reason  Pt will call me    ------            Message                     Nausea, abdominal pain, cannot walk.       -Vomitting bile for 3-4 week periods      -Gastroperesis       -HgbA1C-5.8      -Endoscopy showed abrasion on stomach but nothing else.      -Radicular pain down down right side-calf and shin numbness/tingling-                Action Taken                     [facility]  11/18/2019 2:30:12 PM > Insurance did not recieve results-F#[phone].       [name],[name]  11/18/2019 2:55:04 PM > I will discuss with [name].       [name],[name]  11/18/2019 3:05:06 PM > Matt from [employer]-P#[id number], [phone], Claim #[id number], Send Bone scan, stomach emptying test.      Denton Meek  11/21/2019 3:01:24 PM > LVMx1.       [name],[name]  11/21/2019 3:06:00 PM > She is going to see pain clinic Monday-she will let me know what they injected and if they used lidocaine.                    * * *                ---          * * *         Provider: [name] 11/17/2019    ---    Note generated by eClinicalWorks EMR/PM Software ([email or web address])

## 2019-11-24 ENCOUNTER — Ambulatory Visit

## 2019-11-24 MED ORDER — GABAPENTIN: 1 | 30 | 0 refills | 0 days | Status: AC

## 2019-11-25 ENCOUNTER — Ambulatory Visit: Admitting: Rheumatology

## 2019-11-25 NOTE — Progress Notes (Signed)
* * *      [name], [name] **DOB:** 01-26-1961 (59 yo F) **Acc No.** [id number] **DOS:**  11/25/2019    ---       [name], [name]**    ------    76 Y old Female, DOB: Feb 19, 1961    [address], [place], [place] [address]    Home: [phone]    Provider: [name]        * * *    Telephone Encounter    ---    Answered by  Denton Meek Date: 11/25/2019       Time: 11:09 AM    Reason  Insurance meeting    ------            Message                     Pt getting MRI with pain clinic.                Action Taken                     [facility]  11/25/2019 11:09:24 AM > 12/16/19 MRI.        [name],[name]  12/04/2019 3:59:59 PM > Pt LVM that she is meeting with insurance company tomorrow.  I returned call and LVM of what we had faxed.      [name],[name]  12/04/2019 4:01:10 PM > Pt called to let me know she has MRI and f/u scheduled.  She will let me know if she needs any more info for the insurance company.                    * * *                ---          * * *         Provider: [name] 11/25/2019    ---    Note generated by eClinicalWorks EMR/PM Software ([email or web address])

## 2019-11-27 ENCOUNTER — Ambulatory Visit

## 2019-11-27 MED ORDER — BACLOFEN: 0.5 | 15 | 3 refills | 0 days | Status: AC

## 2019-12-09 ENCOUNTER — Ambulatory Visit: Admitting: Rheumatology

## 2019-12-09 ENCOUNTER — Ambulatory Visit: Admitting: Registered"

## 2019-12-09 ENCOUNTER — Ambulatory Visit: Admit: 2019-12-09

## 2019-12-09 NOTE — Progress Notes (Signed)
* * *      [name], [name] **DOB:** 02/24/1961 (59 yo F) **Acc No.** [id number] **DOS:**  12/09/2019    ---       [name], [name]**    ------    [name], DOB: 1960-10-12    [address], [place], [place] [address]    Home: [phone]    Provider: [name]        * * *    Telephone Encounter    ---    Answered by  Denton Meek Date: 12/09/2019       Time: 12:45 PM    Reason  Appt    ------            Action Taken                     [facility]  12/09/2019 12:45:24 PM > Can you call pt and have her schedule f/u with Elmire Amrein-just  to have one on the books.  Thanks!      Ye,[name]  12/09/2019 12:50:01 PM > LVM for pt. Booked for 9/17 at 11:40.                    * * *                ---          * * *         Provider: [name] 12/09/2019    ---    Note generated by eClinicalWorks EMR/PM Software ([email or web address])

## 2019-12-09 NOTE — Progress Notes (Signed)
 .  Progress Notes  .  Patient: [name], [name]  Provider: Consuella Lose    .  DOB: Sep 22, 1960 Age: 59 Y Sex: Female  .  PCP: Ruffin [facility]    Date: 12/09/2019  .  --------------------------------------------------------------------------------  .  REASON FOR APPOINTMENT  .  1. TELEHEALTH  .  HISTORY OF PRESENT ILLNESS  .  GENERAL:   Ms. Mcquary is a 59 yo female with hx of diverticulosis, reflux,  GI bleed s/p recent hospitalization Feb 2021 followed in GI  clinic (Dr. Shawnee Knapp), referred to nutrition for dietary management  of GI symptoms; she is seen via telephone appointment today,  patient [name]) and provider Consuella Lose) were on  today's called. Ms. Harling reports experiencing GI symptoms of  early satiety and "severe episodes" of vomitting/diarrhea since  March 2021- she notes her "episodes" come every 8-14 days; she  has a baseline hx of constipation (takes bolace & metamucil with  relatively good effect); notes she gets full after eating just a  few bites of food; denies bloating/abdominal distention; does  feel lower abominal cramping within 15 minutes of eating foods.  Denise unintentional weight loss recently although she does note  loss of lean muslce mass during hospitalization. She reports  100lb intentional weight loss previously through diet and  exercise. No know food allergies; she notes red peppers & orange  juice trigger GERD symptomsTypical Intake:B: water/juice, egg +  english muffin, raisin bran cerealtypidally feels too full to eat  during the dayD: 2 shrimp, handful of pasta, small saladS:  applesauceBeverages: water, diet coke Vitamin supplmenets:  vitamin D3, calcium, vitamin C, magnesium, "vitamin B"- not sure  which/if it is a complex.  .  CURRENT MEDICATIONS  .  Taking Acetaminophen 500 MG Tablet 2 tablets as needed Orally  every 8 hrs, Notes: as needed  Taking Baclofen 10 MG Tablet 1/2 tab Orally prn, Notes: prn  Taking Calcium Carbonate-Vitamin D 600-200 MG-UNIT Capsule 1  capsule  with a meal Orally Twice a day  Taking Famotidine 40 MG Tablet 1 tablet at bedtime Orally Once a  day, Notes: prn  Taking Gammagard - Solution as directed Injection once every 4  weeks, Notes: every 4 weeks  Taking glipiZIDE ER 5 MG Tablet Extended Release 24 Hour TAKE 1  TABLET BY MOUTH EVERY DAY  Taking Glucophage XR 500 mg Tablet Extended Release 24 Hour 4  tablets Orally Once a day  Taking Hydroxychloroquine Sulfate 200 MG Tablet 1 tablet with  food or milk Orally Once a day  Taking Jardiance 10 MG Tablet 1 tablet Orally Once a day  Taking Lidocaine 5 % Patch 1 patch remove after 12 hours  Externally Once a day, Notes: prn  Taking Lisinopril 2.5 MG Tablet 1 tablet Orally Once a day  Taking Mycophenolate Mofetil 500 MG Tablet 2 tablet Orally twice  daily  Taking Omeprazole 40 mg Capsule Delayed Release 1 capsule Orally  Once a day  Taking oxyCODONE HCl 5 MG Tablet 1 tablet as needed Orally every  4-6 hrs, Notes: prn  Taking traMADol HCl 50 MG Tablet 1 tablet Orally every 6 hrs,  Notes: PRN - rarely takes  Taking Ursodiol 500 MG Tablet 1 tablet Orally Twice a day  Taking Vitamin B Complex - Tablet 1 tablet Orally once daily  Taking Vitamin D 25 MCG (1000 UT) Tablet 1 tablet Orally Once a  day  Not-Taking/PRN glipiZIDE XL 5 MG Tablet Extended Release 24 Hour  1 tablet Orally Once  a day  Not-Taking/PRN MiraLax 17 GM Packet 1 packet mixed with 8 ounces  of fluid Orally Once a day  Not-Taking/PRN predniSONE 10 mg Tablet 1 tablet Orally Once a day  Not-Taking/PRN Rituxan 500 MG/50ML Solution as directed  Intravenous , Notes: Last dose 05/19/19  .  VITAL SIGNS  .  Ht-in 62, Wt-lbs 155, BMI 28.35, BSA 1.75, Ht-cm 157.48, Wt-kg  70.31weight: 151-158lbs (weight range)- stable withing that  rangenotes hx of 100lb weight loss (intentional).  .  ASSESSMENTS  .  Gastroparesis - K31.84  .  Diverticulosis - K57.90  .  Nutrition Diagnosis:Altered GI function related to gastroparesis  & GERD as evidenced by gastric emptying study,  early  satiety/fullness, vomittingSummary:Ms. Konczal is a 59 yo female  with hx of diabetes, GERD, diverticulosis and newly diagnosed  gastroparesis on gastric emptying study seen for nutrition  assessment via telephone enouncer; she reports c/o early satiety,  decreased oral intake and severe episodes of vomitting/diarrhea  every 8-14 days. Today we discussed dietary modifications for  gastroparesis including frequent small meals (1-1.5 cups of food  per sitting), smooth/soft textures, liquid meals and low fat/low  fiber meals. Individual food lists and meal/snack ideas were  suggested to patient; I also suggested she resume a liquid  nutrition supplement at least once per day (rather than skipping  a meal) and that she separate her liquids from solids. She may  benefit from additional support via FD Gard or Iberogast in the  future, which we can reassess in about 1 month. I also suggested  she keep a food and GI symptom log x 5-7 days (template provided)  to best assess her daily intake and symptoms. Recommendations:.  [name]  TREATMENT  .  Gastroparesis  Notes: 1) Small frequent meals (1-1.5cups of food every 2-3  hours)  2) Smooth/soft textured meals; liquid meals may be better  tolerated than solids; minimize high fat/high fiber foods  3) Consider use of liquid nutrition supplements as tolerated-  patient did well with high protein ensure shake.  4) Separate fluids (water, juice) 30 minutes from solid foods  meals.  5) Keep food and GI symptom log x 1 week  .  MONITOR: GI symptoms, oral intake, food/gi symptom log  .  Time spent: 60 minutes individualized conseling & coordinatin of  care.  .  Electronically signed by Consuella Lose , MS RD LDN  on 12/09/2019 at 05:23 PM EDT  .  Document electronically signed by Consuella Lose    .

## 2019-12-09 NOTE — Progress Notes (Signed)
 [name] [name], [name] [name] **DOB:** [birth year] (59 yo F) **Acc No.** [id number] **DOS:**  12/09/2019    ---       [name] [name], [name] [name]**    ------    34 Y old Female, DOB: [birth year], External MRN: [id number]    Account Number: [id number]    [address], [place], [place]    Home: [phone]    Guarantor: [name] [name] Insurance: [id number] PPO    PCP: Ruffin Frederick Referring: Ruffin Frederick    Appointment Facility: [facility]        * * *    12/09/2019 Progress Notes: Consuella Lose, MS, RD, LDN **CHN#:** [phone]    ------    ---       **Reason for Appointment**    ---      1\. TELEHEALTH    ---      **History of Present Illness**    ---     _GENERAL_ :    Ms. [name] [name] is a 59 yo female with hx of diverticulosis, reflux, GI bleed s/p  recent hospitalization Feb 2021 followed in GI clinic (Dr. Shawnee Knapp), referred to  nutrition for dietary management of GI symptoms; she is seen via telephone  appointment today, patient [name] [name]) and provider Consuella Lose) were on  today's called. Ms. [name] [name] reports experiencing GI symptoms of early satiety  and "severe episodes" of vomitting/diarrhea since March 2021- she notes her  "episodes" come every 8-14 days; she has a baseline hx of constipation (takes  bolace & metamucil with relatively good effect); notes she gets full after  eating just a few bites of food; denies bloating/abdominal distention; does  feel lower abominal cramping within 15 minutes of eating foods. [name] [name]  unintentional weight loss recently although she does note loss of lean muslce  mass during hospitalization. She reports 100lb intentional weight loss  previously through diet and exercise.    No know food allergies; she notes red peppers & orange juice trigger GERD  symptoms    Typical Intake:    B: water/juice, egg + english muffin, raisin bran cereal    typidally feels too full to eat during the day    D: 2 shrimp, handful of pasta, small salad    S: applesauce    Beverages: water, diet  coke    Vitamin supplmenets: vitamin D3, calcium, vitamin C, magnesium, "vitamin B"-  not sure which/if it is a complex.      **Current Medications**    ---    Taking    * Acetaminophen 500 MG Tablet 2 tablets as needed Orally every 8 hrs, Notes: as needed    ---    * Baclofen 10 MG Tablet 1/2 tab Orally prn, Notes: prn    ---    * Calcium Carbonate-Vitamin D 600-200 MG-UNIT Capsule 1 capsule with a meal Orally Twice a day    ---    * Famotidine 40 MG Tablet 1 tablet at bedtime Orally Once a day, Notes: prn    ---    * Gammagard - Solution as directed Injection once every 4 weeks, Notes: every 4 weeks    ---    * glipiZIDE ER 5 MG Tablet Extended Release 24 Hour TAKE 1 TABLET BY MOUTH EVERY DAY     ---    * Glucophage XR 500 mg Tablet Extended Release 24 Hour 4 tablets Orally Once a day    ---    * Hydroxychloroquine Sulfate  200 MG Tablet 1 tablet with food or milk Orally Once a day    ---    * Jardiance 10 MG Tablet 1 tablet Orally Once a day    ---    * Lidocaine 5 % Patch 1 patch remove after 12 hours Externally Once a day, Notes: prn    ---    * Lisinopril 2.5 MG Tablet 1 tablet Orally Once a day    ---    * Mycophenolate Mofetil 500 MG Tablet 2 tablet Orally twice daily    ---    * Omeprazole 40 mg Capsule Delayed Release 1 capsule Orally Once a day    ---    * oxyCODONE HCl 5 MG Tablet 1 tablet as needed Orally every 4-6 hrs, Notes: prn    ---    * traMADol HCl 50 MG Tablet 1 tablet Orally every 6 hrs, Notes: PRN - rarely takes    ---    * Ursodiol 500 MG Tablet 1 tablet Orally Twice a day    ---    * Vitamin B Complex - Tablet 1 tablet Orally once daily    ---    * Vitamin D 25 MCG (1000 UT) Tablet 1 tablet Orally Once a day    ---    Not-Taking/PRN    * glipiZIDE XL 5 MG Tablet Extended Release 24 Hour 1 tablet Orally Once a day    ---    * MiraLax 17 GM Packet 1 packet mixed with 8 ounces of fluid Orally Once a day    ---    * predniSONE 10 mg Tablet 1 tablet Orally Once a day    ---    * Rituxan 500  MG/50ML Solution as directed Intravenous , Notes: Last dose 05/19/19    ---      **Vital Signs**    ---    Ht-in 62, Wt-lbs **155** , BMI **28.35** , BSA **1.75** , Ht-cm 157.48, Wt-kg  **70.31**    weight: 151-158lbs (weight range)- stable withing that range    notes hx of 100lb weight loss (intentional).      **Assessments**    ---    1\. Gastroparesis - K31.84    ---    2\. Diverticulosis - K57.90    ---     Nutrition Diagnosis:    Altered GI function related to gastroparesis & GERD as evidenced by gastric  emptying study, early satiety/fullness, vomitting    Summary:    Ms. [name] [name] is a 59 yo female with hx of diabetes, GERD, diverticulosis and  newly diagnosed gastroparesis on gastric emptying study seen for nutrition  assessment via telephone enouncer; she reports c/o early satiety, decreased  oral intake and severe episodes of vomitting/diarrhea every 8-14 days. Today  we discussed dietary modifications for gastroparesis including frequent small  meals (1-1.5 cups of food per sitting), smooth/soft textures, liquid meals and  low fat/low fiber meals. Individual food lists and meal/snack ideas were  suggested to patient; I also suggested she resume a liquid nutrition  supplement at least once per day (rather than skipping a meal) and that she  separate her liquids from solids. She may benefit from additional support via  FD Gard or Iberogast in the future, which we can reassess in about 1 month. I  also suggested she keep a food and GI symptom log x 5-7 days (template  provided) to best assess her daily intake and symptoms. Recommendations:.    ---      **  Treatment**    ---      **1\. Gastroparesis**    Notes: 1) Small frequent meals (1-1.5cups of food every 2-3 hours)    2) Smooth/soft textured meals; liquid meals may be better tolerated than  solids; minimize high fat/high fiber foods    3) Consider use of liquid nutrition supplements as tolerated- patient did well  with high protein ensure shake.    4)  Separate fluids (water, juice) 30 minutes from solid foods meals.    5) Keep food and GI symptom log x 1 week        MONITOR: GI symptoms, oral intake, food/gi symptom log        Time spent: 60 minutes individualized conseling & coordinatin of care.    ---    Electronically signed by Consuella Lose , MS RD LDN on 12/09/2019 at 05:23 PM  EDT    Sign off status: Completed        * * *        [facility]    Please call [phone] if    unsure of appointment location    [place], [place] [address]    Tel: [phone]    Fax: [phone]              * * *          Progress Note: Consuella Lose, MS, RD, LDN 12/09/2019    ---    Note generated by eClinicalWorks EMR/PM Software ([email or web address])

## 2019-12-10 ENCOUNTER — Ambulatory Visit: Admitting: Physician Assistant

## 2019-12-10 MED ORDER — Hydroxychloroquine Sulfate: 200 | Tablet | 2 refills | 0 days | Status: DC

## 2019-12-11 ENCOUNTER — Ambulatory Visit

## 2019-12-19 ENCOUNTER — Ambulatory Visit

## 2019-12-19 ENCOUNTER — Ambulatory Visit: Admitting: Rheumatology

## 2019-12-19 ENCOUNTER — Ambulatory Visit: Admit: 2019-12-19

## 2019-12-19 NOTE — Progress Notes (Signed)
 .  Progress Notes  .  Patient: [name], [name]  Provider: [name]  DOB:02/07/1961 Age: 59 Y Sex: Female  .  PCP: Ruffin Frederick  MD  Date: 12/19/2019  .  --------------------------------------------------------------------------------  .  HISTORY OF PRESENT ILLNESS  .  GENERAL:   She is here today for FU of her hand, back and leg pain. She  notes that the pain in hands is still a problem. Although she  initially felt better on CellCept and off steroids, but now feels  as though the medication is not helping much. But she remains off  steroids. She continues to recieve IVIG via home infusion  monthly. Her biggest problem however is severe right sided back  pain. This is excruciating and is worse with walking, at times  she iseven unable to walk. Initially focused on the back, she now  has radiation to right ankle. She went to see a pain doctor at  [name] who gave her a steroid injection which didn't help much. He  also got an MRI. She is using a compound of  gabapentin/lidocaine/diclofenac. She has gotten 3 doses of COVID  vaccine. She has not had any recent infections. No side effects  of medicine.  .  CURRENT MEDICATIONS  .  Taking Acetaminophen 500 MG Tablet 2 tablets as needed Orally  every 8 hrs, Notes: as needed  Taking Baclofen 10 MG Tablet 1/2 tab Orally prn, Notes: prn  Taking Calcium Carbonate-Vitamin D 600-200 MG-UNIT Capsule 1  capsule with a meal Orally Twice a day  Taking Famotidine 40 MG Tablet 1 tablet at bedtime Orally Once a  day, Notes: prn  Taking Gabapentin 300 MG Capsule 1 capsule Orally 3 times daily  Taking Gammagard - Solution as directed Injection once every 4  weeks, Notes: every 4 weeks  Taking glipiZIDE ER 5 MG Tablet Extended Release 24 Hour TAKE 1  TABLET BY MOUTH EVERY DAY  Taking Glucophage XR 500 mg Tablet Extended Release 24 Hour 4  tablets Orally Once a day  Taking Hydroxychloroquine Sulfate 200 MG Tablet TAKE 1 TABLET BY  MOUTH EVERY DAY WITH FOOD OR MILK  Taking  Jardiance 10 MG Tablet 1 tablet Orally Once a day  Taking Lidocaine 5 % Patch 1 patch remove after 12 hours  Externally Once a day, Notes: prn  Taking Lisinopril 2.5 MG Tablet 1 tablet Orally Once a day  Taking Mycophenolate Mofetil 500 MG Tablet 2 tablet Orally twice  daily  Taking Omeprazole 40 mg Capsule Delayed Release 1 capsule Orally  Once a day  Taking oxyCODONE HCl 5 MG Tablet 1 tablet as needed Orally every  4-6 hrs, Notes: prn  Taking traMADol HCl 50 MG Tablet 1 tablet Orally every 6 hrs,  Notes: PRN - rarely takes  Taking Ursodiol 500 MG Tablet 1 tablet Orally Twice a day  Taking Vitamin B Complex - Tablet 1 tablet Orally once daily  Taking Vitamin D 25 MCG (1000 UT) Tablet 1 tablet Orally Once a  day  Not-Taking/PRN glipiZIDE XL 5 MG Tablet Extended Release 24 Hour  1 tablet Orally Once a day  Not-Taking/PRN MiraLax 17 GM Packet 1 packet mixed with 8 ounces  of fluid Orally Once a day  Not-Taking/PRN predniSONE 10 mg Tablet 1 tablet Orally Once a day  Not-Taking/PRN Rituxan 500 MG/50ML Solution as directed  Intravenous , Notes: Last dose 05/19/19  Medication List reviewed and reconciled with the patient  .  PAST MEDICAL HISTORY  .  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 Katrinka Blazing 2010--- never infected, but  packed with antibiotics with surgery;  Right rotator cuff repairs x 4, complicated by repeat tears,  infection, placement of anchor material  Elevated liver function tests  Diabetes  Seronegative RA  GI bleed  GERD  Pagets disease  Stage II fibrosis of  Liver  .  ALLERGIES  .  N.K.D.A.  [name]  REVIEW OF SYSTEMS  .  Rheumatology:  .  General    No fever, weight loss, swollen glands . Muscoskeletal     +Joint pain, +joint swelling, +morning stiffness  . Eyes    No  vision loss, eye dry, red eye, eye pain . Mouth    No mouth sores  . Cardiovascular    ? raynaud, no chest pain , irregular heart  beat, racing heart beat, leg swelling, swelling/whitening of hand  . Pulmonary    No cough, cough blood, shortness of breath .  Gastrointestinal    + Abdominal pain,+ N/v . Skin    No rash,  photosensitivity . Lymphatics    No swollen glands, tender glands  . Neurological    +numbness or tingling, +Localized weakness .  [name]  VITAL SIGNS  .  Pain scale 7, Ht-in 62, Wt-lbs 151, BMI 27.62, BP 120/80, HR  80100%.  [name]  EXAMINATION  .  RAPID 3: Function(0-10): 6.0. Pain(0-10):7.5.  Patient Global(0-10):0.5.  .  Rheumatology:  General Appearance: Alert and oriented , No apparent distress .  Eyes: No, scleral icterus, scleral erythema .  Pulm:Clear to auscultation bilaterally.  Skin:No rashes or nail changes, mo obvious digital ulceration.  MuskuloskeletalElbows, shoulders, hips, knees have intact ROM.  Hands have apparent synovitis in the 2-3 PIPs and MCPs  bilaterally. She has tenderness at the right lower back lateral  to the sacrum, near but not over SI joints and down along IT band  to mud thigh. ROM is intact at the right hip. There is paraspinal  muscle spasm. Knees, ankles and feet have no evidence of  synovitits..  Skin and Nails Minimal sclerodactyly, no cracking atthe  fingertips, no ulcers.  .  ASSESSMENTS  .  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  Scleroderma - M34.9  .  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  Long-term use of high-risk medication - Z79.899  .  PFO (patent foramen ovale) - Q21.1  .  Regarding her inflammatory arthritis, Gwendy still has active  disease, but her biggest issue is the back pain. I don't have the  report of her MRI but  she brought a CD with the images which I  reviewed today. L3-5 she has severe scoliotic changes which look  unchanged from prior CT and x-rays. She has multilevel disc bulge  with apparent impingement of nerve roots and central canal. I  will obtain the full report for review. She is due to see the  pain specialist next week and I provided the report of her last  MRI here from 2011 for his  review. I recommend that she continue  the CellCept for now, we can consider adding a low dose steroid  depending on what we need to do about the back - she may very  well need surgery. If so, I will send her to see Dr. Ellwood Sayers  here at [name]. From a GI and cardiology standpoint she has been  stable. She has no evidence of ongoing bleeding. She continues to  have elevated LFT's of unclear etiology. Marland KitchenPLAN:- Follow-up with  pain clinic - Encourage physical activity as tolerated- Follow-up  after pain clinica eval in 2 months- Continue CellCept, recent  labs show no toxicity (LFT elevations are chronic), no repeat  needed today.  .  FOLLOW UP  .  2 Months  .  Electronically signed by Erasmo Leventhal , MD on  12/21/2019 at 03:18 PM EDT  .  Document electronically signed by [name]

## 2019-12-19 NOTE — Progress Notes (Signed)
 [name], [name] **DOB:** [birth year] (59 yo F) **Acc No.** [id number] **DOS:**  12/19/2019    ---       [name], [name]**    ------    87 Y old Female, DOB: September 13, 1960, External MRN: [id number]    Account Number: [id number]    [address][name], [id number]    Home: [phone]    Guarantor: [name] Insurance: [id number] NHP PPO    PCP: [name] Frederick, MD Referring: [name] Frederick, MD    Appointment Facility: Rheumatology, Allergy and Immunology        * * *    12/19/2019 Progress Notes: [name] Pimple, MD **CHN#:** [id number]    ------    ---       **History of Present Illness**    ---     _GENERAL_ :    She is here today for FU of her hand, back and leg pain. She notes that the  pain in hands is still a problem. Although she initially felt better on  CellCept and off steroids, but now feels as though the medication is not  helping much. But she remains off steroids. She continues to recieve IVIG via  home infusion monthly.    Her biggest problem however is severe right sided back pain. This is  excruciating and is worse with walking, at times she iseven unable to walk.  Initially focused on the back, she now has radiation to right ankle. She went  to see a pain doctor at [facility] who gave her a steroid injection which didn't  help much. He also got an MRI.    She is using a compound of gabapentin/lidocaine/diclofenac.    She has gotten 3 doses of COVID vaccine. She has not had any recent  infections. No side effects of medicine.      **Current Medications**    ---    Taking    * Acetaminophen 500 MG Tablet 2 tablets as needed Orally every 8 hrs, Notes: as needed    ---    * Baclofen 10 MG Tablet 1/2 tab Orally prn, Notes: prn    ---    * Calcium Carbonate-Vitamin D 600-200 MG-UNIT Capsule 1 capsule with a meal Orally Twice a day    ---    * Famotidine 40 MG Tablet 1 tablet at bedtime Orally Once a day, Notes: prn    ---    * Gabapentin 300 MG Capsule 1 capsule Orally 3 times daily    ---    *  Gammagard - Solution as directed Injection once every 4 weeks, Notes: every 4 weeks    ---    * glipiZIDE ER 5 MG Tablet Extended Release 24 Hour TAKE 1 TABLET BY MOUTH EVERY DAY     ---    * Glucophage XR 500 mg Tablet Extended Release 24 Hour 4 tablets Orally Once a day    ---    * Hydroxychloroquine Sulfate 200 MG Tablet TAKE 1 TABLET BY MOUTH EVERY DAY WITH FOOD OR MILK     ---    * Jardiance 10 MG Tablet 1 tablet Orally Once a day    ---    * Lidocaine 5 % Patch 1 patch remove after 12 hours Externally Once a day, Notes: prn    ---    * Lisinopril 2.5 MG Tablet 1 tablet Orally Once a day    ---    * Mycophenolate Mofetil 500 MG  Tablet 2 tablet Orally twice daily    ---    * Omeprazole 40 mg Capsule Delayed Release 1 capsule Orally Once a day    ---    * oxyCODONE HCl 5 MG Tablet 1 tablet as needed Orally every 4-6 hrs, Notes: prn    ---    * traMADol HCl 50 MG Tablet 1 tablet Orally every 6 hrs, Notes: PRN - rarely takes    ---    * Ursodiol 500 MG Tablet 1 tablet Orally Twice a day    ---    * Vitamin B Complex - Tablet 1 tablet Orally once daily    ---    * Vitamin D 25 MCG (1000 UT) Tablet 1 tablet Orally Once a day    ---    Not-Taking/PRN    * glipiZIDE XL 5 MG Tablet Extended Release 24 Hour 1 tablet Orally Once a day    ---    * MiraLax 17 GM Packet 1 packet mixed with 8 ounces of fluid Orally Once a day    ---    * predniSONE 10 mg Tablet 1 tablet Orally Once a day    ---    * Rituxan 500 MG/50ML Solution as directed Intravenous , Notes: Last dose 05/19/19    ---    Medication List reviewed and reconciled with the patient    ---      **Past Medical History**    ---      Scleroderma - CREST dx 2007.        ---    IgG4 deficiency s/p IVIG 2010 ----single infusion given preventively after  week of bilateral knee replacements at [facility] 2010.        ---    Fx left wrist in 1994.        ---    Blood clot at LUE in 2006 on a short course of Coumadin.        ---    Bilateral carpal tunnel syndrome s/p Lt carpal  tunnel release and steroids  injection right--.        ---    Bone spur left foot.        ---    Scoliosis.        ---    Spinal stenosis s/p steroids injection.        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 Katrinka [name] 2010--- never infected, but packed with antibiotics with  surgery;.        ---    Right rotator cuff repairs x 4, complicated by repeat tears, infection,  placement of anchor material.        ---    Elevated liver function tests.        ---    Diabetes.        ---    Seronegative RA.        ---    GI bleed .        ---    GERD.        ---    Pagets disease.        ---    Stage II fibrosis of Liver.        ---      **Allergies**    ---      N.K.D.A.    ---      **Review of Systems**    ---     _Rheumatology_ :  General No fever, weight loss, swollen glands. Muscoskeletal + **Joint pain,  +joint swelling, +morning stiffness** . Eyes No vision loss, eye dry, red eye,  eye pain. Mouth No mouth sores. Cardiovascular ? raynaud, no chest pain ,  irregular heart beat, racing heart beat, leg swelling, swelling/whitening of  hand. Pulmonary No cough, cough blood, shortness of breath. Gastrointestinal  **\+ Abdominal pain,+ N/v**. Skin No rash, photosensitivity. Lymphatics No  swollen glands, tender glands. Neurological **+numbness or tingling,  +Localized weakness**.         **Vital Signs**    ---    Pain scale **7** , Ht-in 62, Wt-lbs **151** , BMI **27.62** , BP **120/80** ,  HR **80**    100%.      **Examination**    ---     _RAPID 3:_    Function (0-10): 6.0.        Pain (0-10): 7.5.        Patient Global (0-10): 0.5.    _Rheumatology:_    General Appearance:  Alert and oriented , No apparent distress .    Eyes:  No, scleral icterus, scleral erythema .    Pulm: Clear to auscultation bilaterally.    Skin: No rashes or nail changes, mo obvious digital ulceration.    Muskuloskeletal Elbows, shoulders, hips, knees have intact ROM. Hands have  apparent  synovitis in the 2-3 PIPs and MCPs bilaterally. She has tenderness at  the right lower back lateral to the sacrum, near but not over SI joints and  down along IT band to mud thigh. ROM is intact at the right hip. There is  paraspinal muscle spasm. Knees, ankles and feet have no evidence of  synovitits..    Skin and Nails  Minimal sclerodactyly, no cracking atthe fingertips, no  ulcers.         **Assessments**    ---    1\. 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 Scleroderma - M34.9    ---    3\. 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 Long-term use of high-risk medication - Z79.899    ---    5\. PFO (patent foramen ovale) - Q21.1    ---     Regarding her inflammatory arthritis, Sukaina still has active disease, but  her biggest issue is the back pain. I don't have the report of her MRI but she  brought a CD with the images which I reviewed today. L3-5 she has severe  scoliotic changes which look unchanged from prior CT and x-rays. She has  multilevel disc bulge with apparent impingement of nerve roots and central  canal. I will obtain the full report for review. She is due to see the pain  specialist next week and I provided the report of her last MRI here from 2011  for his review.    I recommend that she continue the CellCept for now, we can consider adding a  low dose steroid depending on what we need to do about the back - she may very  well need surgery. If so, I will send her to see Dr. Ellwood Sayers here at  [facility]    From a GI and cardiology standpoint she has been stable. She has no evidence  of ongoing bleeding. She continues to have elevated LFT's of unclear etiology.    [name]    PLAN:    - Follow-up with pain clinic    -  Encourage physical activity as tolerated    - Follow-up after pain clinica eval in 2 months    - Continue CellCept, recent labs show no toxicity (LFT elevations are  chronic), no repeat needed today.    ---      **Follow Up**    ---    2  Months    Electronically signed by Erasmo Leventhal , MD on 12/21/2019 at 03:18 PM EDT    Sign off status: Completed        * * *        Rheumatology, Allergy and Immunology    [address]    [address], [address] Floor    [place], [place] [address]    Tel: [phone]    Fax: [phone]              * * *          Progress Note: [name] Pimple, MD 12/19/2019    ---    Note generated by eClinicalWorks EMR/PM Software ([email or web address])

## 2019-12-22 ENCOUNTER — Ambulatory Visit: Admitting: Rheumatology

## 2019-12-22 MED ORDER — Omeprazole: 40 | 180 | Freq: Two times a day (BID) | 1 refills | 0 days | Status: AC

## 2019-12-22 MED ORDER — Omeprazole: 40 | Capsule | Freq: Every day | 1 refills | 0 days | Status: AC

## 2019-12-22 NOTE — Progress Notes (Signed)
* * *      [name], [name] **DOB:** [birth year] (59 yo F) **Acc No.** [id number] **DOS:**  12/22/2019    ---       [name], [name]**    ------    74 Y old Female, DOB: 18-May-1960    [address], [place], [place] [id number]    Home: [phone]    Provider: [name]        * * *    Telephone Encounter    ---    Answered by  [name] Date: 12/22/2019       Time: 10:21 AM    Reason   apt - call monday to check in    ------            Message                     Pt was told during last apt that [name] would like her to see Dr. Jonette Mate for discussion of whether or not she needs surgery.                Action Taken                     [name],[name]  12/22/2019 10:21:57 AM >Pt scheduled for 9/27 at 10:30 for xray in [place] 4 then 11:00 with Dr. Ellwood Sayers in [place] 7                Refills Continue Omeprazole Capsule Delayed Release, 40 mg, Orally, 90  Capsule, 1 capsule, Once a day, 90 days, Refills=1    ------          * * *                ---          * * *         Provider: [name] 12/22/2019    ---    Note generated by eClinicalWorks EMR/PM Software ([email or web address])

## 2019-12-22 NOTE — Progress Notes (Signed)
* * *      RONIE, FLEEGER [name] **DOB:** 08-Jan-1961 (59 yo F) **Acc No.** [id number] **DOS:**  12/22/2019    ---       [name], Riley [name]**    ------    61 Y old Female, DOB: 23-Jan-1961    [address], [place], [place] [id number]    Home: [phone]    Provider: [name]        * * *    Telephone Encounter    ---    Answered by  [name] Date: 12/22/2019       Time: 02:34 PM    Reason  refill    ------            Action Taken                     [name]  12/22/2019 2:36:01 PM > sent updated rx stating twice daily as she takes it twice daily sometimes.                 Refills Continue Omeprazole Capsule Delayed Release, 40 mg, Orally, 180, 1  capsule, twice daily, 90 days, Refills=1    ------          * * *                ---          * * *         Provider: [name] 12/22/2019    ---    Note generated by eClinicalWorks EMR/PM Software ([email or web address])

## 2019-12-29 ENCOUNTER — Ambulatory Visit: Admitting: Neurological Surgery

## 2019-12-29 ENCOUNTER — Ambulatory Visit

## 2019-12-29 ENCOUNTER — Ambulatory Visit: Admit: 2019-12-29

## 2019-12-29 NOTE — Progress Notes (Signed)
 .  Progress Notes  .  Patient: [name], [name] [facility]  Provider: Corinna Capra I   .  DOB: [birth year] Age: 59 Y Sex: Female  .  PCP: Ruffin Frederick  MD  Date: 12/29/2019  .  --------------------------------------------------------------------------------  .  HISTORY OF PRESENT ILLNESS  .  Ambulatory Falls and Injury Prevention:  HPI  .  Have you experienced a fall in the past year?No falls in the past  year , Is the patient using assistive devices such as a cane or  walker?No , Do you need assistance with ambulation while at our  facility?No  .  .  Associated Providers:  c/o Primary Care Provider  Ruffin Frederick, MD.  .  .  NEUROSURGERY:  58 year old female presents in  initial neurosurgical consultation with lower back pain and right  radiculopathy. She had an internal bleed in Feb. 2021. She  initally thought she had a stomach bug. After 3 days of inability  to eat/drink, she presented to the hospital and was told that she  had an internal bleed. She had IVF and antibiotics. She was seen  by a gastro here at [facility] who per the patient did not see any  reason for further testing. She has continued to have issues  since this episode. She will have an upset stomach every 8-14  days (emesis and diarrhea). She has been working with a  nutrionist here at [facility] also. A lesion was found on her liver  during her hospitalization. She was told that she had Paget's  Disease. [name], her rheumatologist, believed that a lesion  to her right pelvis might be the cause of her symptoms. She had  scans completed. Since this episode, she started to notice  weakness to her lower extremities. She ruptured her right  hamstring in Dec. 2020. She had reconstructed surgery in Jan.  2021. She started to have pain down her right leg. She saw a PT  and a massage therapist. She was doing therapy on her own also.  The right leg weakness progressed. She had a lumbar injection in  Aug. 2021 at [facility] clinic. Today she reports pain that radiates  from  her right lower back into right lateral thigh to the ankle.  She presents today with crutches. She cannot walk more than 20  yards without crutches. She is unable to support her self. She  has needed crutches for 2 months. The symptoms are improved with  forward flexion at the waist.Over the last few days she has  started to have deep left lower extremity pain. She has been told  in the past that she has scoliosis and spinal stenosis. This was  diagnosed in 2011/2012. She has not had lumbar spine surgery. She  used to use an inversion table but since she moved she has not  been able to use it.  .  CURRENT MEDICATIONS  .  Taking Acetaminophen 500 MG Tablet 2 tablets as needed Orally  every 8 hrs, Notes: as needed  Taking Baclofen 10 MG Tablet 1/2 tab Orally prn, Notes: prn  Taking Calcium Carbonate-Vitamin D 600-200 MG-UNIT Capsule 1  capsule with a meal Orally Twice a day  Taking Famotidine 40 MG Tablet 1 tablet at bedtime Orally Once a  day, Notes: prn  Taking Gabapentin 300 MG Capsule 1 capsule Orally 3 times daily,  Notes: only takes 1x prior to sleep due to side effects  Taking Gammagard - Solution as directed Injection once every 4  weeks, Notes: every 4 weeks (infusion)  Taking glipiZIDE ER 5 MG Tablet Extended Release 24 Hour TAKE 1  TABLET BY MOUTH EVERY DAY  Taking Glucophage XR 500 mg Tablet Extended Release 24 Hour 4  tablets Orally Once a day  Taking Hydroxychloroquine Sulfate 200 MG Tablet TAKE 1 TABLET BY  MOUTH EVERY DAY WITH FOOD OR MILK  Taking Jardiance 10 MG Tablet 1 tablet Orally Once a day  Taking Lidocaine 5 % Patch 1 patch remove after 12 hours  Externally Once a day, Notes: prn  Taking Lisinopril 2.5 MG Tablet 1 tablet Orally Once a day  Taking Mycophenolate Mofetil 500 MG Tablet 2 tablet Orally twice  daily  Taking Omeprazole 40 mg Capsule Delayed Release 1 capsule Orally  twice daily  Taking traMADol HCl 50 MG Tablet 1 tablet Orally every 6 hrs,  Notes: PRN - rarely takes  Taking Ursodiol  500 MG Tablet 1 tablet Orally Twice a day  Taking Vitamin B Complex - Tablet 1 tablet Orally once daily  Taking Vitamin D 25 MCG (1000 UT) Tablet 1 tablet Orally Once a  day  Not-Taking/PRN glipiZIDE XL 5 MG Tablet Extended Release 24 Hour  1 tablet Orally Once a day  Not-Taking/PRN MiraLax 17 GM Packet 1 packet mixed with 8 ounces  of fluid Orally Once a day  Not-Taking/PRN oxyCODONE HCl 5 MG Tablet 1 tablet as needed  Orally every 4-6 hrs, Notes: prn  Not-Taking/PRN predniSONE 10 mg Tablet 1 tablet Orally Once a day  Not-Taking/PRN Rituxan 500 MG/50ML Solution as directed  Intravenous , Notes: Last dose 05/19/19  Medication List reviewed and reconciled with the patient  .  PAST MEDICAL HISTORY  .  Scleroderma - CREST dx 2007  IgG4 deficiency s/p IVIG 2010 ----single infusion given  preventively after week of bilateral knee replacements at [facility]  2010  Fx left wrist in 1994  Blood clot at LUE in 2006 on a short course of Coumadin  Bilateral carpal tunnel syndrome s/p Lt carpal tunnel release and  steroids injection right--  Bone spur left foot  Scoliosis  Spinal stenosis s/p steroids injection  s/p bilateral knee replacements for valgus /arthritic  complications, performed by Dr. Katrinka Blazing 2010--- never infected, but  packed with antibiotics with surgery;  Right rotator cuff repairs x 4, complicated by repeat tears,  infection, placement of anchor material  Elevated liver function tests  Diabetes  Seronegative RA  GI bleed  GERD  Pagets disease  Stage II fibrosis of Liver  .  ALLERGIES  .  N.K.D.A.  .  SURGICAL HISTORY  .  Right rotator cuff repair, with infected hardware that had to be  removed 2006  Repeat right shoulder surgery, also which became infected. 2007  Left rotator cuff repair 2008  bilateral knee replacements 2009  Left arthroscopic carpal tunnel release 01/2011  ORIF Left 4th metatarsal bone  Left Ulna shortening 1994  Knee replacement 2010  Hand surgery 2013, 2015  remove gallbladder/hernia 1990  Plate  left foot 3rd metatarsal 2008  Left reverse TSA 03/07/16  Left Hamstring Repair 05/2019  .  FAMILY HISTORY  .  Mother: deceased 68 yrs, lung cancer, hyperthyroidism, diagnosed  with Other malignant neoplasm of unspecified site  Father: deceased 6 yrs, heart attack, diagnosed with Unspecified  heart disease  Siblings: alive, Brother - scleroderma, COPD  3 brother(s) .  Denies family history of GI cancer or liver disease.  .  SOCIAL HISTORY  .  .  Tobacco  history: Never smoked.  .  Marital Status Single.  .  Work/Occupation: Out of work since 03/12/2020 due to current  conditions; Works for [employer] in Clinical biochemist.  .  Alcohol Former daily EtOH use in 20s.  .  Abuse/NeglectDo you feel unsafe in your relationships?No , Have  you ever been hit, kicked, punched or otherwise hurt by someone  in the past year? No , PlanResources Provided, Patient states  they feel safe to return home.  .  Illicit drugs: Denies.  [name]  HOSPITALIZATION/MAJOR DIAGNOSTIC PROCEDURE  .  as above  GI bleed 05/2019  .  REVIEW OF SYSTEMS  .  GENERAL:  .  Negative for:    weight loss within the past year,, weight gain  within the past year,, chest pain,, irregular heart beats,, heart  murmurs,, nausea / vomiting,, easy bruising,, fever / chills,,  frequent nose bleeds,, hoarse voice,, frequent headaches,,  shortness of breath,, breast discharge,, kidney disease,, liver  disease,, previous anesthesiea problems, .  .  NEUROLOGY:  .  Positive for:    back pain, weakness . Negative for:    loss of  smell,, double vision,, difficulty swallowing,, vision loss in  one eye or the other,, hearing loss,, trouble with balance and  coordination,, ringing in the ears, .  .  VITAL SIGNS  .  Pain scale 8, Ht-in 62, Wt-lbs 151, BMI 27.62, BP 110/68, HR 99.  [name]  PHYSICAL EXAMINATION  .  NEUROSURGERY:  MOTOR Lower Extremities  : 5/5 strength in the bilateral lower  extremities. :5/5 strength in the bilateral lower extremities.  Reflexes  Left Knee Jerk Absent, Right Knee  Jerk Absent, Left  Ankle Jerk Absent no left achilles, Right Ankle Jerk 2+. Left  Knee JerkAbsent , Right Knee JerkAbsent , Left Ankle JerkAbsent  no left achilles , Right Ankle Jerk2+. Gait  : Antalgic uses  crutches. :Antalgic uses crutches.  DIAGNOSTIC STUDIES:  RADIOLOGY  I reviewed lumbar ap/lateral/flexion/extension xrays  completed 12/29/2019 at [facility].[name] MRI  Uploaded to PACS; I reviewed  lumbar spine MRI completed 12/16/2019 at [facility].  .  ASSESSMENTS  .  Lumbar spinal stenosis - M48.061 (Primary)  .  Scoliosis - M41.9  .  Reviewing the lumbar spine xrays, she has scoliosis. She will  have scoliosis xrays completed today.Reviewing the lumbar spine  MRI, she has significant lumbar stenosis. There is stenosis at  L2-L3, L3-L4, L4-L5, and L5-S1. She is debiliated at this time.  She will have a thoracic and lumbar CT scan completed today to  evaluated further. Realistic expectations of a surgical  intervention discussed with the patient. If a surgery did not  improve her symptoms, she may end of requiring a motorized  scooter and be a candidate for a spinal cord stimulator. The  patient is following up with Dr. [name] this week. I recommend  that mobility aides are discussed with the patient at that  appointment (evaluate if the crutches are the best option for  her).  .  Electronically signed by Corinna Capra , MD on  12/29/2019 at 01:11 PM EDT  .  Document electronically signed by Dionisio David, RON I   .

## 2019-12-29 NOTE — Progress Notes (Signed)
 [name], Mary-Anne [name] **DOB:** [birth year] (59 yo F) **Acc No.** [id number] **DOS:**  12/29/2019    ---       [name], [name]**    ------    52 Y old Female, DOB: Feb 05, 1961, External MRN: [id number]    Account Number: [id number]    [address], [place], [place]    Home: [phone]    Guarantor: [name] Insurance: [id number] NH[employer] PPO    PCP: Ruffin Frederick, MD Referring: Erasmo Leventhal External Visit ID: [id number]    Appointment Facility: Neurosurgery        * * *    12/29/2019 Progress Notes: Corinna Capra, MD **CHN#:** [id number]    ------    ---       **Current Medications**    ---    Taking    * Acetaminophen 500 MG Tablet 2 tablets as needed Orally every 8 hrs, Notes: as needed    ---    * Baclofen 10 MG Tablet 1/2 tab Orally prn, Notes: prn    ---    * Calcium Carbonate-Vitamin D 600-200 MG-UNIT Capsule 1 capsule with a meal Orally Twice a day    ---    * Famotidine 40 MG Tablet 1 tablet at bedtime Orally Once a day, Notes: prn    ---    * Gabapentin 300 MG Capsule 1 capsule Orally 3 times daily, Notes: only takes 1x prior to sleep due to side effects    ---    * Gammagard - Solution as directed Injection once every 4 weeks, Notes: every 4 weeks (infusion)    ---    * glipiZIDE ER 5 MG Tablet Extended Release 24 Hour TAKE 1 TABLET BY MOUTH EVERY DAY     ---    * Glucophage XR 500 mg Tablet Extended Release 24 Hour 4 tablets Orally Once a day    ---    * Hydroxychloroquine Sulfate 200 MG Tablet TAKE 1 TABLET BY MOUTH EVERY DAY WITH FOOD OR MILK     ---    * Jardiance 10 MG Tablet 1 tablet Orally Once a day    ---    * Lidocaine 5 % Patch 1 patch remove after 12 hours Externally Once a day, Notes: prn    ---    * Lisinopril 2.5 MG Tablet 1 tablet Orally Once a day    ---    * Mycophenolate Mofetil 500 MG Tablet 2 tablet Orally twice daily    ---    * Omeprazole 40 mg Capsule Delayed Release 1 capsule Orally twice daily    ---    * traMADol HCl 50 MG Tablet 1 tablet Orally every 6 hrs, Notes: PRN -  rarely takes    ---    * Ursodiol 500 MG Tablet 1 tablet Orally Twice a day    ---    * Vitamin B Complex - Tablet 1 tablet Orally once daily    ---    * Vitamin D 25 MCG (1000 UT) Tablet 1 tablet Orally Once a day    ---    Not-Taking/PRN    * glipiZIDE XL 5 MG Tablet Extended Release 24 Hour 1 tablet Orally Once a day    ---    * MiraLax 17 GM Packet 1 packet mixed with 8 ounces of fluid Orally Once a day    ---    * oxyCODONE HCl 5 MG Tablet 1 tablet as needed Orally every  4-6 hrs, Notes: prn    ---    * predniSONE 10 mg Tablet 1 tablet Orally Once a day    ---    * Rituxan 500 MG/50ML Solution as directed Intravenous , Notes: Last dose 05/19/19    ---    Medication List reviewed and reconciled with the patient    ---     Past Medical History    ---      Scleroderma - CREST dx 2007.        ---    IgG4 deficiency s/p IVIG 2010 ----single infusion given preventively after  week of bilateral knee replacements at [facility] 2010.        ---    Fx left wrist in 1994.        ---    Blood clot at LUE in 2006 on a short course of Coumadin.        ---    Bilateral carpal tunnel syndrome s/p Lt carpal tunnel release and steroids  injection right--.        ---    Bone spur left foot.        ---    Scoliosis.        ---    Spinal stenosis s/p steroids injection.        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 Katrinka Blazing 2010--- never infected, but packed with antibiotics with  surgery;.        ---    Right rotator cuff repairs x 4, complicated by repeat tears, infection,  placement of anchor material.        ---    Elevated liver function tests.        ---    Diabetes.        ---    Seronegative RA.        ---    GI bleed .        ---    GERD.        ---    Pagets disease.        ---    Stage II fibrosis of Liver.        ---      **Surgical History**    ---      Right rotator cuff repair, with infected hardware that had to be removed  2006    ---    Repeat right shoulder surgery,  also which became infected. 2007    ---    Left rotator cuff repair 2008    ---    bilateral knee replacements 2009    ---    Left arthroscopic carpal tunnel release 01/2011    ---    ORIF Left 4th metatarsal bone    ---    Left Ulna shortening 1994    ---    Knee replacement 2010    ---    Hand surgery 2013, 2015    ---    remove gallbladder/hernia 1990    ---    Plate left foot 3rd metatarsal 2008    ---    Left reverse TSA 03/07/16    ---    Left Hamstring Repair 05/2019    ---      **Family History**    ---      Mother: deceased 25 yrs, lung cancer, hyperthyroidism, diagnosed with Other  malignant neoplasm of unspecified site    ---    Father: deceased  50 yrs, heart attack, diagnosed with Unspecified heart  disease    ---    Siblings: alive, Brother - scleroderma, COPD    ---    3 brother(s) .    ---    Denies family history of GI cancer or liver disease.    ---      **Social History**    ---    Tobacco history: Never smoked.    Marital Status  Single.    Work/Occupation: Out of work since 03/12/2020 due to current conditions; Works  for [employer] in Clinical biochemist.    Alcohol  Former daily EtOH use in 20s.    Abuse/Neglect Do you feel unsafe in your relationships? No, Have you ever been  hit, kicked, punched or otherwise hurt by someone in the past year? No, Plan  Resources Provided, Patient states they feel safe to return home.    Illicit drugs: Denies.     **Allergies**    ---      N.K.D.A.    ---    [name]]      **Hospitalization/Major Diagnostic Procedure**    ---      as above    ---    GI bleed 05/2019    ---      **Review of Systems**    ---     _GENERAL_ :    Negative for: weight loss within the past year,, weight gain within the past  year,, chest pain,, irregular heart beats,, heart murmurs,, nausea /  vomiting,, easy bruising,, fever / chills,, frequent nose bleeds,, hoarse  voice,, frequent headaches,, shortness of breath,, breast discharge,, kidney  disease,, liver disease,, previous  anesthesiea problems,.    _NEUROLOGY_ :    Positive for: back pain, weakness. Negative for: loss of smell,, double  vision,, difficulty swallowing,, vision loss in one eye or the other,, hearing  loss,, trouble with balance and coordination,, ringing in the ears,.          **History of Present Illness**    ---     _Ambulatory Falls and Injury Prevention_ :    HPI Have you experienced a fall in the past year? No falls in the past year,  Is the patient using assistive devices such as a cane or walker? No, Do you  need assistance with ambulation while at our facility? No.    _Associated Providers_ :    c/o Primary Care Provider Ruffin Frederick, MD.    _NEUROSURGERY_ :    59 year old female presents in initial neurosurgical consultation with lower  back pain and right radiculopathy.    She had an internal bleed in Feb. 2021. She initally thought she had a stomach  bug. After 3 days of inability to eat/drink, she presented to the hospital and  was told that she had an internal bleed. She had IVF and antibiotics. She was  seen by a gastro here at [facility] who per the patient did not see any reason for  further testing. She has continued to have issues since this episode. She will  have an upset stomach every 8-14 days (emesis and diarrhea). She has been  working with a nutrionist here at [facility] also.    A lesion was found on her liver during her hospitalization. 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 [name], her rheumatologist, believed that a  lesion to her right pelvis might be the cause of her symptoms. She had scans  completed.    Since this  episode, she started to notice weakness to her lower extremities.    She ruptured her right hamstring in Dec. 2020. She had reconstructed surgery  in Jan. 2021. She started to have pain down her right leg. She saw a PT and a  massage therapist. She was doing therapy on her own also. The right leg  weakness progressed. She had a lumbar injection in Aug. 2021 at [facility] clinic.     Today she reports pain that radiates from her right lower back into right  lateral thigh to the ankle. She presents today with crutches. She cannot walk  more than 20 yards without crutches. She is unable to support her self. She  has needed crutches for 2 months. The symptoms are improved with forward  flexion at the waist.    Over the last few days she has started to have deep left lower extremity pain.    She has been told in the past that she has scoliosis and spinal stenosis. This  was diagnosed in 2011/2012. She has not had lumbar spine surgery. She used to  use an inversion table but since she moved she has not been able to use it.      **Vital Signs**    ---    Pain scale 8, Ht-in 62, Wt-lbs 151, BMI **27.62** , BP 110/68, HR 99.      **Physical Examination**    ---     _NEUROSURGERY_ :    MOTOR Lower Extremities : 5/5 strength in the bilateral lower extremities.  Reflexes Left Knee Jerk Absent, Right Knee Jerk Absent, Left Ankle Jerk Absent  no left achilles, Right Ankle Jerk 2+. Gait : Antalgic uses crutches.    _DIAGNOSTIC STUDIES_ :    RADIOLOGY I reviewed lumbar ap/lateral/flexion/extension xrays completed  12/29/[facility][name] MRI Uploaded to PACS; I reviewed lumbar spine MRI  completed 12/16/2019 at [facility].         **Assessments**    ---    1\. Lumbar spinal stenosis - M48.061 (Primary)    ---    2\. Scoliosis - M41.9    ---     Reviewing the lumbar spine xrays, she has scoliosis. She will have scoliosis  xrays completed today.    Reviewing the lumbar spine MRI, she has significant lumbar stenosis. There is  stenosis at L2-L3, L3-L4, L4-L5, and L5-S1.    She is debiliated at this time. She will have a thoracic and lumbar CT scan  completed today to evaluated further.    Realistic expectations of a surgical intervention discussed with the patient.  If a surgery did not improve her symptoms, she may end of requiring a  motorized scooter and be a candidate for a spinal cord stimulator. The  patient  is following up with Dr. [name] this week. I recommend that mobility aides are  discussed with the patient at that appointment (evaluate if the crutches are  the best option for her).    ---    Electronically signed by Corinna Capra , MD on 12/29/2019 at 01:11 PM EDT    Sign off status: Completed        * * *        Neurosurgery    [address] [place], [address]    [place], [place] [address]    Tel: [phone]    Fax: [phone]              * * *  Progress Note: Corinna Capra, MD 12/29/2019    ---    Note generated by eClinicalWorks EMR/PM Software ([email or web address])

## 2019-12-30 ENCOUNTER — Ambulatory Visit: Admitting: Student in an Organized Health Care Education/Training Program

## 2019-12-30 NOTE — Progress Notes (Signed)
* * *      [name], [name] **DOB:** June 06, 1960 (59 yo F) **Acc No.** [id number] **DOS:**  12/30/2019    ---       [name], [name]**    ------    75 Y old Female, DOB: June 10, 1960    [address], [place], [place] [id number]    Home: [phone]    Provider: Denton Meek        * * *    Telephone Encounter    ---    Answered by  [name] Date: 12/30/2019       Time: 10:39 AM    Reason  FYI update    ------            Message                     [name], pt just wanted to give you an update. She LVM saying she saw Dr. Ellwood Sayers (spine surgeon) who said she had a 50/50 chance of walking if surgery is performed and he is willing to "give it a try". Xrays and CT scan were done.       She is extremely thankful to you for all you have done!                  Action Taken                     [name],[name]  12/30/2019 10:42:43 AM >                    * * *                ---          * * *         Provider: Denton Meek 12/30/2019    ---    Note generated by eClinicalWorks EMR/PM Software ([email or web address])

## 2019-12-31 ENCOUNTER — Ambulatory Visit

## 2019-12-31 ENCOUNTER — Ambulatory Visit: Admitting: Rheumatology

## 2019-12-31 MED ORDER — Mycophenolate Mofetil: 500 | 360 | Freq: Two times a day (BID) | 1 refills | 0 days | Status: AC

## 2019-12-31 NOTE — Progress Notes (Signed)
 [name], Triniti [name] **DOB:** 08-23-1960 (59 yo F) **Acc No.** [id number] **DOS:**  12/31/2019    ---       [name], Kenia [name]**    ------    15 Y old Female, DOB: 1960/05/15    [address], [place], [place] [id number]    Home: [phone]    Provider: [name]        * * *    Telephone Encounter    ---    Answered by  Rodena Goldmann Date: 12/31/2019       Time: 12:43 PM    Reason  CT    ------            Message                     Pt left following message for [name]: 1. She has an appt with a CT(?) in 6 wks -- too far out as she can't walk and is in pain. Can you assist?      2. Can you prescribe any other med besides gabapentin for her?      3. Having trouble getting CellCept approved (I don't see an encounter or pt doc regarding this).      4. She needs the last progress note sent to her insurance co, as you did previously for her.      Above pharmacy is correct.                 Action Taken                     [name],[name]  12/31/2019 12:55:04 PM >      [name],[name]  12/31/2019 5:28:05 PM > 1.  CT in 6 weeks-[facility]-6 weeks is far.  Let's see if she can be seen sooner.  2.  Cellcept-refill.  3. Gabapentin not working for sleep-will discuss with [name].   4.  Fax to [phone]      [facility]  01/01/2020 11:46:29 AM > Schedule follow up and see if CT can be changed.      [name],[name]  01/05/2020 1:02:07 PM > Can you do me a favor?  Pt has a CT is 6 weeks.  We did not order it but she is very uncomfomfortable abd would benefit from having the CT sooner-therefor allowing ehr surgeyr to be sooner if she decides to have it.  Is CT really booked that far or can they fit her in sooner?      Arva Chafe  01/05/2020 1:09:48 PM > They're doing maintenance on some of their machines in Radiology so they're booking out for a bit. ([facility] is booking out too).      [name],[name]  01/05/2020 3:11:41 PM > LVM for pt.                Refills Refill Mycophenolate Mofetil Tablet, 500 MG,  Orally, 360, 2 tablet,  twice daily, 90 day(s), Refills=1    ------     Start Gabapentin Capsule, 300 MG, Orally, 120, 1 tablet in morning and  afternoon and 2 tabs at bedtime, three times a day-as directed, 30 day(s),  Refills=1          * * *                ---          * * *         Provider: Avice Funchess,  Titus Dubin 12/31/2019    ---    Note generated by eClinicalWorks EMR/PM Software ([email or web address])

## 2020-01-01 ENCOUNTER — Ambulatory Visit

## 2020-01-01 NOTE — Progress Notes (Signed)
 [facility] January 01, 2020  [address]   [place]  [place] [address]  Main: [phone]  Fax: [phone]  Patient Portal: [email or web address]            Fax Cover Sheet      To: [employer]  Time:   Company:   Fax Number: [phone]    From:   Phone number: [phone]  Fax Number: [phone]    # of pages following: ________      Comments:  Vertell Limber CHAIR      ----------------------  Confidentiality Notice  ----------------------    The documents accompanying this facsimile contain confidential information that may be legally protected by federal and state law. This information is intended for use only by the entity or individual to whom it is addressed. The authorized recipient is obligated to maintain this information in a safe, secure and confidential manner, and is prohibited from unauthorized use, disclosure, or failure to maintain confidentiality of this information, unless required to do so by law or regulation.    If you are neither the intended recipient nor the employee or agent of the intended recipient responsible for the delivery of this information, you are hereby notified that any improper disclosure, copying, use or distribution of this information is strictly prohibited. Please notify the sender immediately to arrange for the return of the transmitted documents or to verify destruction.            Created By Tod Persia RN on 01/01/2020 at 02:01 PM    Electronically Signed By Tod Persia RN on 01/01/2020 at 02:01 PM

## 2020-01-01 NOTE — Progress Notes (Signed)
 [facility] January 01, 2020  [address]   [place]  [place] [address]  Main: [phone]  Fax: [phone]  Patient Portal: [name] [name]  [address]  [place], [place]  [address]  [facility] Clinical Summary   [name]  DOB: 08-01-1960   MR#: [id number]  Primary Care Doctor: [name] -PC 4B-  [phone]     Summary of visit January 01, 2020 with     Labs, Tests and Referrals ordered:  <None>    Medications added or changed today include:  GABAPENTIN 300MG  CAPSULES (GABAPENTIN) TAKE 1 CAPSULE BY MOUTH EVERY NIGHT AT BEDTIME AS NEEDED FOR PAIN      Medications removed today include:    For a complete listing of all of your current medications, please see the last page of this Clinical Visit Summary.    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-Q21.1)  SCREENING EXAM FOR BREAST CANCER (ICD-V76.10) (ICD10-Z12.39)  ANEMIA, OTHER (ICD-285.8) (ICD10-D64.89)  LOWER EXTREMITY EDEMA (ICD-782.3) (ICD10-R60.0)  IRREGULAR HEART BEATS (ICD-427.9) (ICD10-I49.9)  GI BLEED (ICD-578.9) (HKV42-V95.2)  SCLERODERMA (ICD-710.1) (ICD10-M34.9)  SCREENING FOR CERVICAL CANCER (ICD-V76.2) (GLO75-I43.4)  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) (ICD10-M47.812)  NECK PAIN (ICD-723.1) (ICD10-M54.2)  STEROID USE, LONG TERM (ICD-V58.65) (ICD10-Z79.52)  PREMATURE VENTRICULAR CONTRACTIONS (ICD-427.69) (ICD10-I49.3)  HYPERLIPIDEMIA (ICD-272.4)  (ICD10-E78.5)  BACK PAIN, LUMBAR, CHRONIC (ICD-724.2) (ICD10-M54.5)  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)  DIABETES MELLITUS, TYPE II (ICD-250.00) (ICD10-E11.9)  LONG-TERM (CURRENT) USE OF STEROIDS (ICD-V58.65) (ICD10-Z79.51)  SKIN SAGGING DUE TO WEIGHT LOSS (ICD-757.9) (ICD10-Q84.9)  TRANSAMINASES, SERUM, ELEVATED (ICD-790.4) (ICD10-R74.0)  OBESITY, BMI 30-34.9, ADULT (ICD-278.00) (ICD10-E66.9)      DIABETES MELLITUS, TYPE II, UNCONTROLLED (ICD-250.02) (ICD10-E11.65)      FATTY LIVER DISEASE (ICD-571.8) (ICD10-K76.0)  SCLERODERMA, 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)  HYPOTHYROIDISM (ICD-244.9) (ICD10-E03.9)  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) (ICD10-D80.8)  SPINAL STENOSIS, 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)  HEPATITIS C EXPOSURE (HCV RNA NEGATIVE, 01/2014) (ICD-V02.62) (ICD10-Z20.5)  PAIN IN JOINT, HAND (ICD-719.44) (ICD10-M79.643)  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  For more information go to [email or web address]  Categories:   Underweight = <18.5   Normal weight = 18.5-24.9   Overweight = 25-29.9           Obesity = BMI of 30 or greater    Most recent blood tests ordered by your Primary Care team as of 01/01/2020:  Cholesterol Tests   Cholesterol 131 Normal = 110-199 06/06/2019   HDL (good cholesterol) 67 Normal > 40 06/06/2019   Triglyceride 109 Normal =  40-250  06/06/2019   LDL (bad cholesterol) 42 Normal < 160  Normal < 130 if you have high blood pressure  Normal < 100 if you have heart disease or diabetes 06/06/2019   Diabetes Tests   Glucose random 377 Normal = 60-150 08/05/2015   HgbA1c 5.8 Normal = 4.3-5.6 11/06/2019   Blood Count Tests   Hematocrit 29.9 Normal = 37-47 06/06/2019   White Blood Cells 9.5 Normal = 4-11 06/06/2019   Platelets 341 Normal = 150-400 06/06/2019   Liver Tests   ALT 118 Normal = 0-54 08/05/2015   AST 47 Normal = 10-42 08/05/2015   Kidney Tests   BUN 15 Normal = 6-24 06/06/2019   Creatinine 0.73 Normal = 0.57-1.30 06/06/2019   GFR (Non African-American) 92 Normal > 60 06/06/2019   GFR (African-American) 106 Normal > 60 06/06/2019     General Medical Advice:  If you think any of the information provided in this summary is incorrect, please notify your provider.    Always carry a list of current medications with you . Provide an updated list to your Primary care Physician or any provider who prescribes your medication.    We have a doctor on call during our off hours--if you have an urgent problem and need to speak with a doctor when our office is closed, please call [phone].      [facility] Medication List  [facility]  [name]  DOB: [birth year]   MR#: [id number]  Primary Care Doctor: [name] -PC 4B-  [phone]    Medications on 01/01/2020:  HYDROXYCHLOROQUINE SULFATE 200 MG ORAL TABLET (HYDROXYCHLOROQUINE SULFATE) one tablet oral daily; Route: ORAL  JARDIANCE 10 MG ORAL TABLET (EMPAGLIFLOZIN) Take 1 tablet by mouth once daily; Route: ORAL  ROSUVASTATIN 10MG  TABLETS (ROSUVASTATIN CALCIUM) TAKE 1 TABLET BY MOUTH EVERY DAY  LISINOPRIL 2.5MG  TABLETS (LISINOPRIL) TAKE 1 TABLET BY MOUTH ONCE DAILY  METFORMIN HCL ER 500 MG XR24H-TAB (METFORMIN HCL) TAKE 4 TABLETS BY MOUTH EVERY MORNING  GLIPIZIDE ER 2.5 MG XR24H-TAB (GLIPIZIDE) TAKE 1 TABLET BY MOUTH DAILY  OMEPRAZOLE 20 MG ORAL CAPSULE DELAYED RELEASE  (OMEPRAZOLE) Take one capsule by mouth twice a day; Route: ORAL  BACLOFEN 10MG  TABLETS (BACLOFEN) TAKE 1/2 TABLET BY MOUTH EVERY DAY AS NEEDED FOR BACK OR SPASMS  DICLOFENAC SODIUM 1 % TRANSDERMAL GEL (DICLOFENAC SODIUM) APP 2 GRAMS EXT AA BID      FREESTYLE FREEDOM LITE w/Device KIT (BLOOD GLUCOSE MONITORING SUPPL) use as directed (ICD 10 E11.65)      FREESTYLE LITE BLOOD GLUCOSE STRIPS (GLUCOSE BLOOD) USE TO CHECK BLOOD GLUCOSE THREE TIMES DAILY      FREESTYLE LANCETS (LANCETS) check fingerstick three times a day (ICD 10 E11.65)  TRAMADOL HCL 50 MG ORAL TABLET (TRAMADOL HCL) take one tablet daily as needed for pain; Route: ORAL  OXYCODONE HCL 5 MG ORAL TABLET (OXYCODONE HCL) Take one tablet daily as needed for pain >8. Partial fill upon patient request.; Route: ORAL  CALCIUM CARBONATE-VITAMIN D 600-200 MG-UNIT ORAL TABLET (CALCIUM CARBONATE-VITAMIN D) take one tablet twice a day; Route: ORAL  GABAPENTIN 300MG  CAPSULES (GABAPENTIN) TAKE 1 CAPSULE BY MOUTH EVERY NIGHT AT BEDTIME AS NEEDED FOR PAIN  * BLOODWORK Please draw chemistry (sodium, potassium, magnesium, chloride, bicarbonate, glucose, creatinine, BUN), CBC, TSH, lipids, HbA1c. Fax result to [phone] Dr. Marlane Hatcher  FAMOTIDINE 20 MG TABLET (FAMOTIDINE) TAKE 1 TABLET BY MOUTH AT NIGHT AS NEEDED  LIDODERM 5 % EXTERNAL PATCH (LIDOCAINE) Apply one patch  to back once daily  as needed for pain, Remove after 12 hrs; Route: EXTERNAL  MYCOPHENOLATE MOFETIL 500 MG ORAL TABLET (MYCOPHENOLATE MOFETIL) Take 2 tablets twice daily; Route: ORAL  GAMMAGARD 20 GM/200ML INJECTION SOLUTION (IMMUNE GLOBULIN (HUMAN)) Receives infusion every 4 weeks, next dose 8/5; Route: INJECTION  URSODIOL 500 MG ORAL TABLET (URSODIOL) Take 1 tablet twice daily; Route: ORAL    Allergies on 01/01/2020: (if it says "NKA" below, that means "No known allergies")  NKA    Advice about Medications:  Always carry a list of current medications with you . Provide an updated list to your Primary care  Physician or any provider who prescribes your medication.    Remember to keep your list updated. Include all over the counter medications such as vitamins and herbals.    Discard all old medication lists.    If you are a specialist seeing this patient and you have made changes to any of the medications above,   please mark the changes on this medication list.    Many other factors can affect your health. If you are struggling with transportation, paying bills, getting food, getting work, then visit one of these websites:  http://[email or web address]  https://www.healthify.us/  https://www.1degree.[email or web address]      Created By Tod Persia RN on 01/01/2020 at 01:54 PM    Electronically Signed By Tod Persia RN on 01/01/2020 at 01:54 PM

## 2020-01-02 ENCOUNTER — Ambulatory Visit: Admitting: Physical Medicine & Rehabilitation

## 2020-01-02 ENCOUNTER — Ambulatory Visit

## 2020-01-02 ENCOUNTER — Ambulatory Visit: Admit: 2020-01-02

## 2020-01-02 MED ORDER — Loftstrand crutches: 2 | Freq: Every day | 0 refills | 0 days | Status: AC

## 2020-01-02 MED ORDER — Gabapentin: 300 | 120 | Freq: Three times a day (TID) | 1 refills | 0 days | Status: AC

## 2020-01-02 NOTE — Progress Notes (Signed)
 .  Progress Notes  .  Patient: [name], [name]  [name]    .  DOB: 1960-12-28 Age: 59 Y Sex: Female  .  PCP: Ruffin Frederick  MD  Date: 01/02/2020  .  --------------------------------------------------------------------------------  .  REASON FOR APPOINTMENT  .  1. 59M FU/RIGHT HIP  .  HISTORY OF PRESENT ILLNESS  .  General:   Ayisha [name] Lando for follow-up of right sided buttock and lateral  leg pain. She was last seen on 11-03-2019 at which time, we did  myofascial trigger point injections. Since then, she has had  increased pain and has started to use axillary crutches for  ambulation. She received a spine injection with Dr Tomasita Morrow  which provided 2-3 days of minimal relief before pain returned to  baseline. She has gotten no significant relief with trigger point  injections either. Since then, she has seen Dr Dionisio David and  is likely to undergo significant lumbar spine surgery, pending  further work-up. She denies any new bowel or bladder dysfunction.  She is here today to discuss other mobility aides such as  Lofstrand crutches, wheelchairs and motorized scooters.  .  CURRENT MEDICATIONS  .  Taking Acetaminophen 500 MG Tablet 2 tablets as needed Orally  every 8 hrs, Notes: as needed  Taking Baclofen 10 MG Tablet 1/2 tab Orally prn, Notes: prn  Taking Calcium Carbonate-Vitamin D 600-200 MG-UNIT Capsule 1  capsule with a meal Orally Twice a day  Taking Famotidine 40 MG Tablet 1 tablet at bedtime Orally Once a  day, Notes: prn  Taking Gabapentin 300 MG Capsule 1 capsule Orally 3 times daily,  Notes: only takes 1x prior to sleep due to side effects  Taking Gammagard - Solution as directed Injection once every 4  weeks, Notes: every 4 weeks (infusion)  Taking glipiZIDE ER 5 MG Tablet Extended Release 24 Hour TAKE 1  TABLET BY MOUTH EVERY DAY  Taking Glucophage XR 500 mg Tablet Extended Release 24 Hour 4  tablets Orally Once a day  Taking Hydroxychloroquine Sulfate 200 MG Tablet TAKE 1 TABLET  BY  MOUTH EVERY DAY WITH FOOD OR MILK  Taking Jardiance 10 MG Tablet 1 tablet Orally Once a day  Taking Lidocaine 5 % Patch 1 patch remove after 12 hours  Externally Once a day, Notes: prn  Taking Lisinopril 2.5 MG Tablet 1 tablet Orally Once a day  Taking Mycophenolate Mofetil 500 MG Tablet 2 tablet Orally twice  daily  Taking Omeprazole 40 mg Capsule Delayed Release 1 capsule Orally  twice daily  Taking traMADol HCl 50 MG Tablet 1 tablet Orally every 6 hrs,  Notes: PRN - rarely takes  Taking Ursodiol 500 MG Tablet 1 tablet Orally Twice a day  Taking Vitamin B Complex - Tablet 1 tablet Orally once daily  Taking Vitamin D 25 MCG (1000 UT) Tablet 1 tablet Orally Once a  day  Not-Taking/PRN glipiZIDE XL 5 MG Tablet Extended Release 24 Hour  1 tablet Orally Once a day  Not-Taking/PRN MiraLax 17 GM Packet 1 packet mixed with 8 ounces  of fluid Orally Once a day  Not-Taking/PRN oxyCODONE HCl 5 MG Tablet 1 tablet as needed  Orally every 4-6 hrs, Notes: prn  Not-Taking/PRN predniSONE 10 mg Tablet 1 tablet Orally Once a day  Not-Taking/PRN Rituxan 500 MG/50ML Solution as directed  Intravenous , Notes: Last dose 05/19/19  Medication List reviewed and reconciled with the patient  .  PAST MEDICAL HISTORY  .  Scleroderma -  CREST dx 2007  IgG4 deficiency s/p IVIG 2010 ----single infusion given  preventively after week of bilateral knee replacements at [facility]  2010  Fx left wrist in 1994  Blood clot at LUE in 2006 on a short course of Coumadin  Bilateral carpal tunnel syndrome s/p Lt carpal tunnel release and  steroids injection right--  Bone spur left foot  Scoliosis  Spinal stenosis s/p steroids injection  s/p bilateral knee replacements for valgus /arthritic  complications, performed by Dr. Katrinka Blazing 2010--- never infected, but  packed with antibiotics with surgery;  Right rotator cuff repairs x 4, complicated by repeat tears,  infection, placement of anchor material  Elevated liver function tests  Diabetes  Seronegative RA  GI  bleed  GERD  Pagets disease  Stage II fibrosis of Liver  .  ALLERGIES  .  N.K.D.A.  .  SURGICAL HISTORY  .  Right rotator cuff repair, with infected hardware that had to be  removed 2006  Repeat right shoulder surgery, also which became infected. 2007  Left rotator cuff repair 2008  bilateral knee replacements 2009  Left arthroscopic carpal tunnel release 01/2011  ORIF Left 4th metatarsal bone  Left Ulna shortening 1994  Knee replacement 2010  Hand surgery 2013, 2015  remove gallbladder/hernia 1990  Plate left foot 3rd metatarsal 2008  Left reverse TSA 03/07/16  Left Hamstring Repair 05/2019  .  FAMILY HISTORY  .  Mother: deceased 19 yrs, lung cancer, hyperthyroidism, diagnosed  with Other malignant neoplasm of unspecified site  Father: deceased 39 yrs, heart attack, diagnosed with Unspecified  heart disease  Siblings: alive, Brother - scleroderma, COPD  3 brother(s) .  Denies family history of GI cancer or liver disease.  .  SOCIAL HISTORY  .  .  Tobacco  history: Never smoked.  .  Marital Status Single.  .  Work/Occupation: Out of work since 03/12/2020 due to current  conditions; Works for [employer] in Clinical biochemist.  .  Alcohol Former daily EtOH use in 20s.  .  Abuse/NeglectDo you feel unsafe in your relationships?No , Have  you ever been hit, kicked, punched or otherwise hurt by someone  in the past year? No , PlanResources Provided, Patient states  they feel safe to return home.  .  Illicit drugs: Denies.  [name]  HOSPITALIZATION/MAJOR DIAGNOSTIC PROCEDURE  .  as above  GI bleed 05/2019  .  REVIEW OF SYSTEMS  .  ORT:  .  Eyes    No . Ear, Nose Throat    No . Digestion, Stomach, Bowel     No . Bladder Problems    No . Bleeding Problems    No .  Numbness/Tingling    No . Anxiety/Depression    No .  Fever/Chills/Fatigue    No . Chest Pain/Tightness/Palpitations     No . Skin Rash    No . Dental Problems    No . Joint/Muscle  Pain/Cramps    Yes . Blackout/Fainting    No . Other    No .  .  VITAL SIGNS  .  Pain scale 7, Ht-in  62, Wt-lbs 151, BMI 27.62, BSA 1.73, Ht-cm  157.48, Wt-kg 68.49.  [name]  PHYSICAL EXAMINATION  .  General: NAD Psych: Mood and affect appropriate HEENT: NC/AT CV:  warm and well perfused Pulm: breathing unlabored, normal chest  expansions Skin: No rash, swelling, ecchymoses, or erythemaGait:  difficulty without axillary crutches(+) TTP over right SIJ,  gluteus maximus, right piriformis, right TFL, GTB; left  gluteus  maximus4+/5 motor strength B/L except 4/5 hip abductors on  rightSensation intact to light touch in all dermatomesReflex 1+  B/L patellar and Achilles(-) Gaenslen's test(-) SIJ compression  test.  .  ASSESSMENTS  .  Spinal stenosis of lumbar region with neurogenic claudication -  M48.062 (Primary)  .  Trochanteric bursitis, right hip - M70.61  .  Iliotibial band syndrome of right side - M76.31  .  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  Shadana [name] Himmelberger is a 59 year old female found to have multilevel  spinal stenosis. She also has ITB tendinitis, as well as greater  trochanteric pain syndrome.-Pt educated regarding diagnosises and  treatment options -Imaging reviewed and discussed with pt -Tiers  of treatment discussed -Continue f/u with Dr Dionisio David for  possible spinal surgery, she is currently undergoing CT work-up  pre-op for planning.-Cool compresses or warm heat to affected  regions prn-Continue compound cream PRN -We discussed Lofstrand  crutches in place of axillary crutches with a few sessions of PT  for training to avoid falls or injuries because she has never  used there and currently has significant pain that can make her a  fall risk. I gave her information to contact [facility] for this.-We had a brief discussion regarding rolling  walkers, rollators, manual and motorized wheelchairs. She would  like to see how she does with the Lofstrand cruthces first. We  did discuss that depending on outcomes of surgery, she may need  to consider other mobility aides.-Discussed red  flag symptoms  such as focal neurologic deficit, bowel or bladder dysfunction  and to report to ED if they develop these symptoms-Follow up PRNA  total of 30 minutes was spent preparing for and during this  encounter including: review of imaging, performing medically  appropriate examination, counseling/education regarding treatment  plans/injections and clinical documentation into the electronic  health record.  .  TREATMENT  .  Spinal stenosis of lumbar region with neurogenic  claudication  Start Loftstrand crutches, -, 2 lofstrand crutches, for  ambulation, daily, 365 days, 2, Refills 0  Physical [id number] Dx: Lumbar spinal stenosis Frequency:  1-2x/weel for 6 weeks Patient has gotten new Lofstrand crutches,  please work on gait training/ambulation and transfers with new  mobility devices  .  FOLLOW UP  .  PRN  .  Electronically signed by [name] on 01/02/2020  at 05:30 PM EDT  .  Document electronically signed by [name]    .

## 2020-01-02 NOTE — Progress Notes (Signed)
 [name], [name] **DOB:** 25-Mar-1961 (59 yo F) **Acc No.** [id number] **DOS:**  01/02/2020    ---       [name], [name]**    ------    59 Y old Female, DOB: 1960-09-16, External MRN: [id number]    Account Number: [id number]    [address], [place], [id number]    Home: [phone]    Guarantor: [name] Insurance: [id number] NHP PPO    PCP: [name] Frederick, MD Referring: [name] Frederick, MD External Visit ID: [id number]    [facility]        * * *    01/02/2020 Progress Notes: [name] **CHN#:** [id number]    ------    ---       **Current Medications**    ---    Taking    * Acetaminophen 500 MG Tablet 2 tablets as needed Orally every 8 hrs, Notes: as needed    ---    * Baclofen 10 MG Tablet 1/2 tab Orally prn, Notes: prn    ---    * Calcium Carbonate-Vitamin D 600-200 MG-UNIT Capsule 1 capsule with a meal Orally Twice a day    ---    * Famotidine 40 MG Tablet 1 tablet at bedtime Orally Once a day, Notes: prn    ---    * Gabapentin 300 MG Capsule 1 capsule Orally 3 times daily, Notes: only takes 1x prior to sleep due to side effects    ---    * Gammagard - Solution as directed Injection once every 4 weeks, Notes: every 4 weeks (infusion)    ---    * glipiZIDE ER 5 MG Tablet Extended Release 24 Hour TAKE 1 TABLET BY MOUTH EVERY DAY     ---    * Glucophage XR 500 mg Tablet Extended Release 24 Hour 4 tablets Orally Once a day    ---    * Hydroxychloroquine Sulfate 200 MG Tablet TAKE 1 TABLET BY MOUTH EVERY DAY WITH FOOD OR MILK     ---    * Jardiance 10 MG Tablet 1 tablet Orally Once a day    ---    * Lidocaine 5 % Patch 1 patch remove after 12 hours Externally Once a day, Notes: prn    ---    * Lisinopril 2.5 MG Tablet 1 tablet Orally Once a day    ---    * Mycophenolate Mofetil 500 MG Tablet 2 tablet Orally twice daily    ---    * Omeprazole 40 mg Capsule Delayed Release 1 capsule Orally twice daily    ---    * traMADol HCl 50 MG Tablet 1 tablet Orally every 6 hrs,  Notes: PRN - rarely takes    ---    * Ursodiol 500 MG Tablet 1 tablet Orally Twice a day    ---    * Vitamin B Complex - Tablet 1 tablet Orally once daily    ---    * Vitamin D 25 MCG (1000 UT) Tablet 1 tablet Orally Once a day    ---    Not-Taking/PRN    * glipiZIDE XL 5 MG Tablet Extended Release 24 Hour 1 tablet Orally Once a day    ---    * MiraLax 17 GM Packet 1 packet mixed with 8 ounces of fluid Orally Once a day    ---    * oxyCODONE HCl 5 MG Tablet 1 tablet as  needed Orally every 4-6 hrs, Notes: prn    ---    * predniSONE 10 mg Tablet 1 tablet Orally Once a day    ---    * Rituxan 500 MG/50ML Solution as directed Intravenous , Notes: Last dose 05/19/19    ---    Medication List reviewed and reconciled with the patient    ---     Past Medical History    ---      Scleroderma - CREST dx 2007.        ---    IgG4 deficiency s/p IVIG 2010 ----single infusion given preventively after  week of bilateral knee replacements at [name] 2010.        ---    Fx left wrist in 1994.        ---    Blood clot at LUE in 2006 on a short course of Coumadin.        ---    Bilateral carpal tunnel syndrome s/p Lt carpal tunnel release and steroids  injection right--.        ---    Bone spur left foot.        ---    Scoliosis.        ---    Spinal stenosis s/p steroids injection.        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 Katrinka Blazing 2010--- never infected, but packed with antibiotics with  surgery;.        ---    Right rotator cuff repairs x 4, complicated by repeat tears, infection,  placement of anchor material.        ---    Elevated liver function tests.        ---    Diabetes.        ---    Seronegative RA.        ---    GI bleed .        ---    GERD.        ---    Pagets disease.        ---    Stage II fibrosis of Liver.        ---      **Surgical History**    ---      Right rotator cuff repair, with infected hardware that had to be removed  2006    ---    Repeat right  shoulder surgery, also which became infected. 2007    ---    Left rotator cuff repair 2008    ---    bilateral knee replacements 2009    ---    Left arthroscopic carpal tunnel release 01/2011    ---    ORIF Left 4th metatarsal bone    ---    Left Ulna shortening 1994    ---    Knee replacement 2010    ---    Hand surgery 2013, 2015    ---    remove gallbladder/hernia 1990    ---    Plate left foot 3rd metatarsal 2008    ---    Left reverse TSA 03/07/16    ---    Left Hamstring Repair 05/2019    ---      **Family History**    ---      Mother: deceased 35 yrs, lung cancer, hyperthyroidism, diagnosed with Other  malignant neoplasm of unspecified site    ---  Father: deceased 74 yrs, heart attack, diagnosed with Unspecified heart  disease    ---    Siblings: alive, Brother - scleroderma, COPD    ---    3 brother(s) .    ---    Denies family history of GI cancer or liver disease.    ---      **Social History**    ---    Tobacco history: Never smoked.    Marital Status  Single.    Work/Occupation: Out of work since 03/12/2020 due to current conditions; Works  for [employer] in Clinical biochemist.    Alcohol  Former daily EtOH use in 20s.    Abuse/Neglect Do you feel unsafe in your relationships? No, Have you ever been  hit, kicked, punched or otherwise hurt by someone in the past year? No, Plan  Resources Provided, Patient states they feel safe to return home.    Illicit drugs: Denies.     **Allergies**    ---      N.K.D.A.    ---    [name] Verified]      **Hospitalization/Major Diagnostic Procedure**    ---      as above    ---    GI bleed 05/2019    ---      **Review of Systems**    ---     _ORT_ :    Eyes No. Ear, Nose Throat No. Digestion, Stomach, Bowel No. Bladder Problems  No. Bleeding Problems No. Numbness/Tingling No. Anxiety/Depression No.  Fever/Chills/Fatigue No. Chest Pain/Tightness/Palpitations No. Skin Rash No.  Dental Problems No. Joint/Muscle Pain/Cramps Yes. Blackout/Fainting No. Other  No.           **Reason for Appointment**    ---      1\. 59M FU/RIGHT HIP    ---      **History of Present Illness**    ---     _General_ :    [name] for follow-up of right sided buttock and lateral leg pain. She  was last seen on 11-03-2019 at which time, we did myofascial trigger point  injections. Since then, she has had increased pain and has started to use  axillary crutches for ambulation. She received a spine injection with Dr  Tomasita Morrow which provided 2-3 days of minimal relief before pain returned to  baseline. She has gotten no significant relief with trigger point injections  either. Since then, she has seen Dr Dionisio David and is likely to undergo  significant lumbar spine surgery, pending further work-up. She denies any new  bowel or bladder dysfunction. She is here today to discuss other mobility  aides such as Lofstrand crutches, wheelchairs and motorized scooters.      **Vital Signs**    ---    Pain scale **7** , Ht-in 62, Wt-lbs **151** , BMI **27.62** , BSA **1.73** ,  Ht-cm 157.48, Wt-kg **68.49**.      **Physical Examination**    ---    General: NAD    Psych: Mood and affect appropriate    HEENT: NC/AT    CV: warm and well perfused    Pulm: breathing unlabored, normal chest expansions    Skin: No rash, swelling, ecchymoses, or erythema    Gait: difficulty without axillary crutches    (+) TTP over right SIJ, gluteus maximus, right piriformis, right TFL, GTB;  left gluteus maximus    4+/5 motor strength B/L except 4/5 hip abductors on right    Sensation intact to light touch in all  dermatomes    Reflex 1+ B/L patellar and Achilles    (-) Gaenslen's test    (-) SIJ compression test.      **Assessments**    ---    1\. 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 Trochanteric bursitis, right hip - M70.61    ---    3\. Iliotibial band syndrome of right side - M76.31    ---    4\. 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 She also has ITB tendinitis, as well as greater trochanteric pain  syndrome.    -Pt educated regarding diagnosises and treatment options     -Imaging reviewed and discussed with pt     -Tiers of treatment discussed     -Continue f/u with Dr Dionisio David for possible spinal surgery, she is currently undergoing CT work-up pre-op for planning.    -Cool compresses or warm heat to affected regions prn    -Continue compound cream PRN     -We discussed Lofstrand crutches in place of axillary crutches with a few sessions of PT for training to avoid falls or injuries because she has never used there and currently has significant pain that can make her a fall risk. I gave her information to contact [facility] for this.    -We had a brief discussion regarding rolling walkers, rollators, manual and motorized wheelchairs. She would like to see how she does with the Lofstrand cruthces first. We did discuss that depending on outcomes of surgery, she may need to consider other mobility aides.    -Discussed red flag symptoms such as focal neurologic deficit, bowel or bladder dysfunction and to report to ED if they develop these symptoms    -Follow up PRN    A total of 30 minutes was spent preparing for and during this encounter  including: review of imaging, performing medically appropriate examination,  counseling/education regarding treatment plans/injections and clinical  documentation into the electronic health record.    ---      **Treatment**    ---      **1\. Spinal stenosis of lumbar region with neurogenic claudication**    Start Loftstrand crutches, -, 2 lofstrand crutches, for ambulation, daily, 365  days, 2, Refills 0    _PROCEDURE: Physical Therapy_   Dx: Lumbar spinal stenosis Frequency:  1-2x/weel for 6 weeks Patient has gotten new Lofstrand crutches, please work  on gait training/ambulation and transfers with new mobility devices     ------      **Follow Up**    ---    PRN    Electronically signed by [name] on 01/02/2020 at 05:30 PM EDT    Sign off status: Completed        * * *        [facility]    [address]    [place] [address]    [place], [place] [address]    Tel: [phone]    Fax: [phone]              * * *          Progress Note: [name] 01/02/2020    ---    Note generated by eClinicalWorks EMR/PM Software ([email or web address])

## 2020-01-05 ENCOUNTER — Ambulatory Visit: Admitting: Adult Health

## 2020-01-05 MED ORDER — Jardiance: 10 | Tablet | 1 refills | 0 days | Status: DC

## 2020-01-13 ENCOUNTER — Ambulatory Visit

## 2020-01-13 ENCOUNTER — Ambulatory Visit: Admitting: Adult Health

## 2020-01-13 ENCOUNTER — Ambulatory Visit: Admitting: Student in an Organized Health Care Education/Training Program

## 2020-01-13 ENCOUNTER — Ambulatory Visit: Admitting: "Endocrinology

## 2020-01-13 ENCOUNTER — Ambulatory Visit: Admit: 2020-01-13

## 2020-01-13 LAB — HX POINT OF CARE: HX HGB A1C, POC: 6.1 % — ABNORMAL HIGH

## 2020-01-13 MED ORDER — LIDOCAINE: 1 | 30 | 3 refills | 0 days | Status: AC

## 2020-01-13 MED ORDER — Lidocaine: 5 | Box | 3 refills | 0 days | Status: AC

## 2020-01-13 MED ORDER — Jardiance: 10 | 90 | Freq: Every day | 3 refills | 0 days | Status: AC

## 2020-01-13 MED ORDER — Glucophage XR: 500 | Tablet | Freq: Every day | 3 refills | 0 days | Status: AC

## 2020-01-13 MED ORDER — Lisinopril: 2.5 | Tablet | Freq: Every day | 3 refills | 0 days | Status: AC

## 2020-01-13 NOTE — Progress Notes (Signed)
 .  Progress Notes  .  Patient: [name], LECLERE [facility]  Provider: [name]    .  DOB: 11/16/1960 Age: 59 Y Sex: Female  .  PCP: Ruffin Frederick  MD  Date: 01/13/2020  .  --------------------------------------------------------------------------------  .  HISTORY OF PRESENT ILLNESS  .  General:  59 y/o female with scleroderma (on  prednisone), IgG-4 deficiency, NASH and prior uncontrolled T2DM.  Our last visit was 09/2018. Her hemoglobin a1c was 5.8% in August.  Reports ongoing, worsening pain in her joints. Walking with  crutches, considering spinal surgery for spinal stenosis.  Mobility has decreased significantly.Saw GI, diagnosed with  gastroparesis, met with nutritionist Consuella Lose.  Diabetes Meds:  .  glipizide 5mg   metformin 2000mg  daily  jardiance 10mg  once daily  _ __ _  reports hypoglycemia 40-60's range on a higher dose of glipizide  10mg .  .  A1c Trend:  .  01/13/20-----6.1%  11/06/19-----5.8%  01/15/19-----6.5%  08/19/18-----7.3%  04/12/18-----7.5%  01/04/18-----7.7%  09/14/17-----10%  03/02/16-----8.7%  08/05/15-----11%  11/04/14-----7.1%  12/31/13-----7%  05/06/13-----6.6%  01/04/18-----7.7%  09/14/17-----10%  03/02/16-----8.7%  08/05/15-----11%.  .  Recent Blood Glucoses:  .  meter downloaded on glooko 01/13/20  30 day bg average=106 +/- 13  100% in target range (70-180), no lows  bg range 89-121.  .  Diet:  .  3 meals daily, low CHO  working on reducing fat/cho portions--seen by Nutrition 04/2016 .  [name]  Exercise:  .  walks 1-2 mi daily with dog , .  .  Microvascular Complications Screening:  .  eyes: no acute vision changes, no hx of retinopathy, reports  annual exam with ophthalmologist is scheduled  kidneys: creat:0.67, egfr 98 (09/12/19), uACR:13 (08/2019)  feet: denies numbess or parethesias in feet, seen by Dr. Vassie Loll-  hammering of the lesser toes, given rx for custom orthotics , .  .  Macrovascular Complications Screening:  .  (+) HLD  on rosuvastatin  TChol: 131, TG 109, HDL:67, LDL:42 Chol/HDL ratio:  2% (06/2019).  .  CURRENT MEDICATIONS  .  Taking Acetaminophen 500 MG Tablet 2 tablets as needed Orally  every 8 hrs, Notes: as needed  Taking Baclofen 10 MG Tablet 1/2 tab Orally prn, Notes: prn  Taking Calcium Carbonate-Vitamin D 600-200 MG-UNIT Capsule 1  capsule with a meal Orally Twice a day  Taking Famotidine 40 MG Tablet 1 tablet at bedtime Orally Once a  day, Notes: prn  Taking Gabapentin 300 MG Capsule 1 capsule Orally 3 times daily,  Notes: only takes 1x prior to sleep due to side effects  Taking Gammagard - Solution as directed Injection once every 4  weeks, Notes: every 4 weeks (infusion)  Taking glipiZIDE ER 5 MG Tablet Extended Release 24 Hour TAKE 1  TABLET BY MOUTH EVERY DAY  Taking Glucophage XR 500 mg Tablet Extended Release 24 Hour 4  tablets Orally Once a day  Taking Hydroxychloroquine Sulfate 200 MG Tablet TAKE 1 TABLET BY  MOUTH EVERY DAY WITH FOOD OR MILK  Taking Jardiance 10 MG Tablet TAKE 1 TABLET BY MOUTH EVERY DAY  Taking Lidocaine 5 % Patch 1 patch remove after 12 hours  Externally Once a day, Notes: prn  Taking Lisinopril 2.5 MG Tablet 1 tablet Orally Once a day  Taking Loftstrand crutches - 2 lofstrand crutches for ambulation  daily  Taking Mycophenolate Mofetil 500 MG Tablet 2 tablet Orally twice  daily  Taking Omeprazole 40 mg Capsule Delayed Release 1 capsule Orally  twice daily  Taking Rosuvastatin Calcium  10 MG Tablet 1 tablet Orally Once a  day  Taking traMADol HCl 50 MG Tablet 1 tablet Orally every 6 hrs,  Notes: PRN - rarely takes  Taking Ursodiol 500 MG Tablet 1 tablet Orally Twice a day  Taking Vitamin B Complex - Tablet 1 tablet Orally once daily  Taking Vitamin D 25 MCG (1000 UT) Tablet 1 tablet Orally Once a  day  Not-Taking/PRN glipiZIDE XL 5 MG Tablet Extended Release 24 Hour  1 tablet Orally Once a day  Not-Taking/PRN MiraLax 17 GM Packet 1 packet mixed with 8 ounces  of fluid Orally Once a day  Not-Taking/PRN oxyCODONE HCl 5 MG Tablet 1 tablet as needed  Orally every 4-6  hrs, Notes: prn  Not-Taking/PRN predniSONE 10 mg Tablet 1 tablet Orally Once a day  Not-Taking/PRN Rituxan 500 MG/50ML Solution as directed  Intravenous , Notes: Last dose 05/19/19  Medication List reviewed and reconciled with the patient  .  PAST MEDICAL HISTORY  .  Scleroderma - CREST dx 2007  IgG4 deficiency s/p IVIG 2010 ----single infusion given  preventively after week of bilateral knee replacements at [facility]  2010  Fx left wrist in 1994  Blood clot at LUE in 2006 on a short course of Coumadin  Bilateral carpal tunnel syndrome s/p Lt carpal tunnel release and  steroids injection right--  Bone spur left foot  Scoliosis  Spinal stenosis s/p steroids injection  s/p bilateral knee replacements for valgus /arthritic  complications, performed by Dr. Katrinka Blazing 2010--- never infected, but  packed with antibiotics with surgery;  Right rotator cuff repairs x 4, complicated by repeat tears,  infection, placement of anchor material  Elevated liver function tests  Diabetes  Seronegative RA  GI bleed  GERD  Pagets disease  Stage II fibrosis of Liver  .  SURGICAL HISTORY  .  Right rotator cuff repair, with infected hardware that had to be  removed 2006  Repeat right shoulder surgery, also which became infected. 2007  Left rotator cuff repair 2008  bilateral knee replacements 2009  Left arthroscopic carpal tunnel release 01/2011  ORIF Left 4th metatarsal bone  Left Ulna shortening 1994  Knee replacement 2010  Hand surgery 2013, 2015  remove gallbladder/hernia 1990  Plate left foot 3rd metatarsal 2008  Left reverse TSA 03/07/16  Left Hamstring Repair 05/2019  .  FAMILY HISTORY  .  Mother: deceased 76 yrs, lung cancer, hyperthyroidism, diagnosed  with Other malignant neoplasm of unspecified site  Father: deceased 33 yrs, heart attack, diagnosed with Unspecified  heart disease  Siblings: alive, Brother - scleroderma, COPD  3 brother(s) .  Denies family history of GI cancer or liver disease.  .  SOCIAL HISTORY  .  .  Tobacco  history:  Never smoked.  .  .  Marital Status  Single  .  [name]  Work/Occupation: Out of work since 03/12/2020 due to current  conditions; Works for [employer] in Clinical biochemist.  .  .  Alcohol  Former daily EtOH use in 20s  .  [name]  Abuse/Neglect  Do you feel unsafe in your relationships?No  Have you ever been hit, kicked, punched or otherwise hurt by  someone in the past year? No  PlanResources Provided, Patient states they feel safe to return  home  .  [name]  Illicit drugs: Denies.  [name]  HOSPITALIZATION/MAJOR DIAGNOSTIC PROCEDURE  .  as above  GI bleed 05/2019  .  REVIEW OF SYSTEMS  .  Diabetes:  .  General:  no unintended weight change, no fevers, malaise,  weakness . Eyes:    no vision loss . Cardiovascular:    no CP, no  orthopnea, no palpitations . Respiratory:    no wheezing, no  dyspnea . Gastrointestinal:    no n/v, no abdominal pain .  Musculoskeletal:    no edema . Skin:    no rash . Neurological:     no numbness .  [name]  VITAL SIGNS  .  Pain scale 8, Ht-in 62, Wt-lbs 157, BMI 28.71, BP 100/68, HR 93,  Temp 97.5, BSA 1.76, O2 98, Ht-cm 157.48, Wt-kg 71.21, Wt Change  6 lb.  .  PHYSICAL EXAMINATION  .  Endo: Diabetes Follow-Up:  Heart/CV:  regular, no murmur.  Extremities:  using crutches for ambulation, reduced ROM R <L,  LE, no ankle edema, feet without cuts or ulcers.  Neurological:  sensation intact to light touch in feet.  General  alert and oriented x3, No acute distress.  lungs: clear to auscultation bilaterally, respirations are  unlabored.  .  ASSESSMENTS  .  Type 2 diabetes mellitus with hyperglycemia, without long-term  current use of insulin - E11.65 (Primary)  .  Other hyperlipidemia - E78.4  .  Obesity (BMI 30-39.9) - E66.9  .  Ms. Charbonneau has a history of obesity, hypothyroidism, scleroderma,  hld and uncontrolled T2DM. Her POC A1c in clinic today is 6.1%.  30 day bg average by glucometer is 106 with no documented  hypoglycemia in the last 30 days. Given Dametria's overall glycemic  stability and low a1c readings (5.8% then  6.1% today) I've  discussed stopping her glipzide to prevent future hypoglycemia.  She will remain on her current doses of metformin and jardiance.  Will monitor fasting bg readings and periodically bg readings  later in the day. Can consider dose adjustment of the jardiance  if needed in the future. Current creat and egfr in excellent.  Instructed to contact me in the interim if her blood glucose  readings increase as a result of her declining mobility.BP is  100/68 today. On ACE-I, hx of proteinuria in the past. Urine  albumin was wnl this year on evaluation.On rosuvastatin for HLD.  LDL at goal, Chol/HDL ratio is 2%.  .  TREATMENT  .  Type 2 diabetes mellitus with hyperglycemia,  without long-term current use of insulin  Stop glipiZIDE ER Tablet Extended Release 24 Hour, 5 MG, TAKE 1  TABLET BY MOUTH EVERY DAY  Refill Glucophage XR Tablet Extended Release 24 Hour, 500 mg, 4  tablets, Orally, Once a day, 90 days, 360 Tablet, Refills 3  Refill Jardiance Tablet, 10 MG, take 1 tablet by mouth every day,  Orally, Once a day, 90 days, 90, Refills 3  Refill Lisinopril Tablet, 2.5 MG, 1 tablet, Orally, Once a day,  90 days, 90 Tablet, Refills 3  .  .  Other hyperlipidemia  Continue Rosuvastatin Calcium Tablet, 10 MG, 1 tablet, Orally,  Once a day, 30 day(s), 30  .  PROCEDURE CODES  .  [id number] GLYCOSYLATED HEMOGLOBINS  .  FOLLOW UP  .  6 Months  .  [name]  Appointment Provider: [name], NP  .  Electronically signed by [name] , NP on  01/16/2020 at 07:33 AM EDT  .  Document electronically signed by [name]    .

## 2020-01-13 NOTE — Progress Notes (Signed)
* * *      [name], [name] [name] **DOB:** 03-08-1961 (59 yo F) **Acc No.** [id number] **DOS:**  01/13/2020    ---        [name], [name]**    ------    36 Y old Female, DOB: 20-Jan-1961    [address], [place], [place] [id number]    Home: [phone]    Provider: Denton Meek        * * *    Telephone Encounter    ---    Answered by    [name]    Date: 01/13/2020        Time: 10:35 AM    Reason    Crutches    ------            Message                      Pt would like a call back from you to discuss increased pain in her back. She is using her crutches more frequently and is now experiencing numbness and tingling. She says her foot is now completely numb. She does have a call out to Dr. Jefm [name] office as well to see what she can do while she waits for her ultrasound apt. She also needs a refill of lidocaine patches sent but I am not sure what pharmacy she wants them sent to                Action Taken                      [name],[name]  01/13/2020 10:40:28 AM >       [name]  01/13/2020 3:32:07 PM > She has numbness/tingling along nerve distribution likely from nerve root in L spine.  Denies changes in bowel or bladder.  She is getting forearm crutches as well as regular crutches that go under armpits.  She has CT scan scheduled for Nov 9th to see if she can have surgery.      [facility]  01/13/2020 3:37:25 PM > Are they taking new pt's here for therapy. [place] here for chronic disease and coping with fast decline.  She denies SI.      Oneisha Ammons  01/13/2020 4:36:05 PM > 3 month wait at [facility].      Breanna Mcdaniel  01/19/2020 2:28:43 PM > [facility] [place]-lofstrand forearm crutches.  FAXED!                Refills    Refill Lidocaine Patch, 5 %, Externally, 1 Box, 1 patch remove  after 12 hours, PRN, 30 days, Refills=3    ------          * * *                ---          * * *          Provider: Denton Meek 01/13/2020    ---    Note generated by  eClinicalWorks EMR/PM Software ([email or web address])

## 2020-01-13 NOTE — Progress Notes (Signed)
[name], Aleynah [name] **DOB:** 1960-05-03 (59 yo F) **Acc No.** [id number] **DOS:**  01/13/2020    ---        [name], Erricka [name]**    ------    59 Y old Female, DOB: 1960-11-29, External MRN: [id number]    Account Number: [id number]    [address], [place], [id number]    Home: [phone]    Guarantor: [name] Insurance: [id number] NHP PPO    PCP: Ruffin Frederick, MD Referring: Ruffin Frederick, MD External Visit ID: [id number]    Appointment Facility: Endocrinology        * * *    01/13/2020    **Appointment Provider:** [name], NP **CHN#:** [id number]    ------    ---        **Current Medications**    ---    Taking      * Acetaminophen 500 MG Tablet 2 tablets as needed Orally every 8 hrs, Notes: as needed     ---    * Baclofen 10 MG Tablet 1/2 tab Orally prn, Notes: prn     ---    * Calcium Carbonate-Vitamin D 600-200 MG-UNIT Capsule 1 capsule with a meal Orally Twice a day     ---    * Famotidine 40 MG Tablet 1 tablet at bedtime Orally Once a day, Notes: prn     ---    * Gabapentin 300 MG Capsule 1 capsule Orally 3 times daily, Notes: only takes 1x prior to sleep due to side effects     ---    * Gammagard - Solution as directed Injection once every 4 weeks, Notes: every 4 weeks (infusion)     ---    * glipiZIDE ER 5 MG Tablet Extended Release 24 Hour TAKE 1 TABLET BY MOUTH EVERY DAY     ---    * Glucophage XR 500 mg Tablet Extended Release 24 Hour 4 tablets Orally Once a day     ---    * Hydroxychloroquine Sulfate 200 MG Tablet TAKE 1 TABLET BY MOUTH EVERY DAY WITH FOOD OR MILK     ---    * Jardiance 10 MG Tablet TAKE 1 TABLET BY MOUTH EVERY DAY     ---    * Lidocaine 5 % Patch 1 patch remove after 12 hours Externally Once a day, Notes: prn     ---    * Lisinopril 2.5 MG Tablet 1 tablet Orally Once a day     ---    * Loftstrand crutches - 2 lofstrand crutches for ambulation daily     ---    * Mycophenolate Mofetil 500 MG Tablet 2 tablet Orally twice daily     ---    * Omeprazole 40 mg Capsule Delayed  Release 1 capsule Orally twice daily     ---    * Rosuvastatin Calcium 10 MG Tablet 1 tablet Orally Once a day     ---    * traMADol HCl 50 MG Tablet 1 tablet Orally every 6 hrs, Notes: PRN - rarely takes     ---    * Ursodiol 500 MG Tablet 1 tablet Orally Twice a day     ---    * Vitamin B Complex - Tablet 1 tablet Orally once daily     ---    * Vitamin D 25 MCG (1000 UT) Tablet 1 tablet Orally Once a day     ---  Not-Taking/PRN    * glipiZIDE XL 5 MG Tablet Extended Release 24 Hour 1 tablet Orally Once a day     ---    * MiraLax 17 GM Packet 1 packet mixed with 8 ounces of fluid Orally Once a day     ---    * oxyCODONE HCl 5 MG Tablet 1 tablet as needed Orally every 4-6 hrs, Notes: prn     ---    * predniSONE 10 mg Tablet 1 tablet Orally Once a day     ---    * Rituxan 500 MG/50ML Solution as directed Intravenous , Notes: Last dose 05/19/19     ---    Medication List reviewed and reconciled with the patient    ---      Past Medical History    ---      Scleroderma - CREST dx 2007.        ---    IgG4 deficiency s/p IVIG 2010 ----single infusion given preventively after  week of bilateral knee replacements at [facility] 2010.        ---    Fx left wrist in 1994.        ---    Blood clot at LUE in 2006 on a short course of Coumadin.        ---    Bilateral carpal tunnel syndrome s/p Lt carpal tunnel release and steroids  injection right--.        ---    Bone spur left foot.        ---    Scoliosis.        ---    Spinal stenosis s/p steroids injection.        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 Katrinka Blazing 2010--- never infected, but packed with antibiotics with  surgery;.        ---    Right rotator cuff repairs x 4, complicated by repeat tears, infection,  placement of anchor material.        ---    Elevated liver function tests.        ---    Diabetes.        ---    Seronegative RA.        ---    GI bleed .        ---    GERD.        ---    Pagets disease.        ---     Stage II fibrosis of Liver.        ---      **Surgical History**    ---      Right rotator cuff repair, with infected hardware that had to be removed  2006    ---    Repeat right shoulder surgery, also which became infected. 2007    ---    Left rotator cuff repair 2008    ---    bilateral knee replacements 2009    ---    Left arthroscopic carpal tunnel release 01/2011    ---    ORIF Left 4th metatarsal bone    ---    Left Ulna shortening 1994    ---    Knee replacement 2010    ---    Hand surgery 2013, 2015    ---    remove gallbladder/hernia 1990    ---    Plate left foot 3rd  metatarsal 2008    ---    Left reverse TSA 03/07/16    ---    Left Hamstring Repair 05/2019    ---      **Family History**    ---      Mother: deceased 87 yrs, lung cancer, hyperthyroidism, diagnosed with Other  malignant neoplasm of unspecified site    ---    Father: deceased 50 yrs, heart attack, diagnosed with Unspecified heart  disease    ---    Siblings: alive, Brother - scleroderma, COPD    ---    3 brother(s) .    ---    Denies family history of GI cancer or liver disease.    ---      **Social History**    ---    Tobacco history: Never smoked.    Marital Status    _Single_    Work/Occupation: Out of work since 03/12/2020 due to current conditions; Works  for [employer] in Clinical biochemist.    Alcohol    _Former daily EtOH use in 20s_    Abuse/Neglect    Do you feel unsafe in your relationships? _No_    Have you ever been hit, kicked, punched or otherwise hurt by someone in the  past year? _No_    Plan _Resources Provided, Patient states they feel safe to return home_    Illicit drugs: Denies.      **Hospitalization/Major Diagnostic Procedure**    ---      as above    ---    GI bleed 05/2019    ---      **Review of Systems**    ---    _Diabetes_ :    General:  no unintended weight change, no fevers, malaise, weakness  . Eyes:  no vision loss  . Cardiovascular:  no CP, no orthopnea, no palpitations  .  Respiratory:  no wheezing, no  dyspnea  . Gastrointestinal:  no n/v, no  abdominal pain  . Musculoskeletal:  no edema  . Skin:  no rash  .  Neurological:  no numbness  .            **History of Present Illness**    ---    _General_ :    59 y/o female with scleroderma (on prednisone), IgG-4 deficiency, NASH and  prior uncontrolled T2DM. Our last visit was 09/2018. Her hemoglobin a1c was  5.8% in August. Reports ongoing, worsening pain in her joints. Walking with  crutches, considering spinal surgery for spinal stenosis. Mobility has  decreased significantly.    Saw GI, diagnosed with gastroparesis, met with nutritionist Consuella Lose.    Diabetes Meds:    glipizide 5mg     metformin 2000mg  daily    jardiance 10mg  once daily    _ __ _    reports hypoglycemia 40-60's range on a higher dose of glipizide 10mg   .    A1c Trend:    01/13/20-----6.1%    11/06/19-----5.8%    01/15/19-----6.5%    08/19/18-----7.3%    04/12/18-----7.5%    01/04/18-----7.7%    09/14/17-----10%    03/02/16-----8.7%    08/05/15-----11%    11/04/14-----7.1%    12/31/13-----7%    05/06/13-----6.6%    01/04/18-----7.7%    09/14/17-----10%    03/02/16-----8.7%    08/05/15-----11%  .    Recent Blood Glucoses:    meter downloaded on glooko 01/13/20    30 day bg average=106 +/- 13    100% in target range (70-180), no lows  bg range 89-121  .    Diet:    3 meals daily, low CHO    working on reducing fat/cho portions--seen by Nutrition 04/2016  .    Exercise:    walks 1-2 mi daily with dog  , .    Microvascular Complications Screening:    eyes: no acute vision changes, no hx of retinopathy, reports annual exam with  ophthalmologist is scheduled    kidneys: creat:0.67, egfr 98 (09/12/19), **uACR:13 (08/2019)**    feet: denies numbess or parethesias in feet, seen by Dr. Vassie Loll- hammering of  the lesser toes, given rx for custom orthotics  , .    Macrovascular Complications Screening:    (+) HLD    on rosuvastatin    TChol: 131, TG 109, HDL:67, LDL:42 Chol/HDL ratio: 2% (06/2019)  .       **Vital Signs**    ---    Pain scale **8** , Ht-in 62, Wt-lbs **157** , BMI  **28.71** , BP **100/68** ,  HR **93** , Temp **97.5** , BSA **1.76** , O2 **98** , Ht-cm 157.48, Wt-kg  **71.21** , Wt Change 6 lb.      **Physical Examination**    ---    _Endo: Diabetes Follow-Up_ :    Heart/CV:  regular  ,  no murmur  .    Extremities:  using crutches for ambulation, reduced ROM R <L, LE, no ankle  edema  ,  feet without cuts or ulcers  .    Neurological:  sensation intact to light touch in feet  .    General  alert and oriented x3, No acute distress  .    lungs: clear to auscultation bilaterally, respirations are unlabored.      **Assessments**    ---    1\. Type 2 diabetes mellitus with hyperglycemia, without long-term current use  of insulin - E11.65 (Primary)    ---    2\. Other hyperlipidemia - E78.4    ---    3\. Obesity (BMI 30-39.9) - E66.9    ---      Ms. Drakeford has a history of obesity, hypothyroidism, scleroderma, hld and  uncontrolled T2DM. Her POC A1c in clinic today is 6.1%. 30 day bg average by  glucometer is 106 with no documented hypoglycemia in the last 30 days. Given  Cora's overall glycemic stability and low a1c readings (5.8% then 6.1% today)  I've discussed stopping her glipzide to prevent future hypoglycemia. She will  remain on her current doses of metformin and jardiance. Will monitor fasting  bg readings and periodically bg readings later in the day. Can consider dose  adjustment of the jardiance if needed in the future. Current creat and egfr in  excellent. Instructed to contact me in the interim if her blood glucose  readings increase as a result of her declining mobility.    BP is 100/68 today. On ACE-I, hx of proteinuria in the past. Urine albumin was  wnl this year on evaluation.    On rosuvastatin for HLD. LDL at goal, Chol/HDL ratio is 2%.    ---      **Treatment**    ---      **1\. Type 2 diabetes mellitus with hyperglycemia, without long-term  current use of insulin**    Stop  glipiZIDE ER Tablet Extended Release 24 Hour, 5 MG, TAKE 1 TABLET BY  MOUTH EVERY DAY    Refill Glucophage XR Tablet Extended Release 24 Hour, 500 mg, 4 tablets,  Orally, Once a  day, 90 days, 360 Tablet, Refills 3    Refill Jardiance Tablet, 10 MG, take 1 tablet by mouth every day, Orally, Once  a day, 90 days, 90, Refills 3    Refill Lisinopril Tablet, 2.5 MG, 1 tablet, Orally, Once a day, 90 days, 90  Tablet, Refills 3    ---        **2\. Other hyperlipidemia**    Continue Rosuvastatin Calcium Tablet, 10 MG, 1 tablet, Orally, Once a day, 30  day(s), 30      **Procedure Codes**    ---      [id number] GLYCOSYLATED HEMOGLOBINS    ---      **Follow Up**    ---    6 Months    **Appointment Provider:** [name], NP    Electronically signed by [name] , NP on 01/16/2020 at 07:33 AM EDT    Sign off status: Completed        * * *        Endocrinology    [address]    [place]    [place], [place] [address]    Tel: [phone]    Fax: [phone]              * * *          Progress Note: [name], NP 01/13/2020    ---    Note generated by eClinicalWorks EMR/PM Software ([email or web address])

## 2020-01-14 ENCOUNTER — Ambulatory Visit

## 2020-01-14 ENCOUNTER — Ambulatory Visit: Admitting: Physical Medicine & Rehabilitation

## 2020-01-14 ENCOUNTER — Ambulatory Visit: Admit: 2020-01-14

## 2020-01-14 NOTE — Progress Notes (Signed)
 .  Progress Notes  .  Patient: [name], [name] [facility]  [name]    .  DOB: November 21, 1960 Age: 59 Y Sex: Female  .  PCP: Ruffin Frederick  MD  Date: 01/14/2020  .  --------------------------------------------------------------------------------  .  REASON FOR APPOINTMENT  .  1. F/U - LBP, RIGHT LEG WEAKNESS  .  HISTORY OF PRESENT ILLNESS  .  General:   [name] for follow-up of right sided buttock and lateral  leg pain. [name] was last seen on 01-02-2020 at which time, [name] was  recommended to start PT for gait training with Lofstrand  crutches. [name] started going to [facility], but  was told [name] might do better with a rollator than Lofstrand  crutches; however, [name] concern is that [name] cannot walk like [name]  usual self, so [name] does not think [name] can use a rollator. [name] has  also had episodes of increased pain and weakness. [name] was  recently started on gabapentin 300mg , 300mg , 600mg  and feels this  does not always provide enough relief. [name] will sometimes take  gabapentin and baclofen at nighttime, which will help [name] sleep  better. [name] gets drowsy with gabapentin during the day, but is  not driving or working, so it is manageable. [name] appointment with  Neurosurgery has been moved up to see if [name] would be a candidate  for surgery. [name] is also having a hard time emotionally  navigating all of the information regarding [name] condition and is  interested in talking with a psychologist or therapist.  .  CURRENT MEDICATIONS  .  Taking Acetaminophen 500 MG Tablet 2 tablets as needed Orally  every 8 hrs, Notes: as needed  Taking Baclofen 10 MG Tablet 1/2 tab Orally prn, Notes: prn  Taking Calcium Carbonate-Vitamin D 600-200 MG-UNIT Capsule 1  capsule with a meal Orally Twice a day  Taking Famotidine 40 MG Tablet 1 tablet at bedtime Orally Once a  day, Notes: prn  Taking Gabapentin 300 MG Capsule 1 capsule Orally 3 times daily,  Notes: only takes 1x prior to sleep due to side effects  Taking Gammagard -  Solution as directed Injection once every 4  weeks, Notes: every 4 weeks (infusion)  Taking Glucophage XR 500 mg Tablet Extended Release 24 Hour 4  tablets Orally Once a day  Taking Hydroxychloroquine Sulfate 200 MG Tablet TAKE 1 TABLET BY  MOUTH EVERY DAY WITH FOOD OR MILK  Taking Jardiance 10 MG Tablet take 1 tablet by mouth every day  Orally Once a day  Taking Lidocaine 5 % Patch 1 patch remove after 12 hours  Externally PRN, Notes: prn  Taking Lisinopril 2.5 MG Tablet 1 tablet Orally Once a day  Taking Loftstrand crutches - 2 lofstrand crutches for ambulation  daily  Taking Mycophenolate Mofetil 500 MG Tablet 2 tablet Orally twice  daily  Taking Omeprazole 40 mg Capsule Delayed Release 1 capsule Orally  twice daily  Taking traMADol HCl 50 MG Tablet 1 tablet Orally every 6 hrs,  Notes: PRN - rarely takes  Taking Ursodiol 500 MG Tablet 1 tablet Orally Twice a day  Taking Vitamin B Complex - Tablet 1 tablet Orally once daily  Taking Vitamin D 25 MCG (1000 UT) Tablet 1 tablet Orally Once a  day  Not-Taking/PRN glipiZIDE XL 5 MG Tablet Extended Release 24 Hour  1 tablet Orally Once a day  Not-Taking/PRN MiraLax 17 GM Packet 1 packet mixed with 8 ounces  of fluid Orally Once  a day  Not-Taking/PRN oxyCODONE HCl 5 MG Tablet 1 tablet as needed  Orally every 4-6 hrs, Notes: prn  Not-Taking/PRN predniSONE 10 mg Tablet 1 tablet Orally Once a day  Not-Taking/PRN Rituxan 500 MG/50ML Solution as directed  Intravenous , Notes: Last dose 05/19/19  Medication List reviewed and reconciled with the patient  .  PAST MEDICAL HISTORY  .  Scleroderma - CREST dx 2007  IgG4 deficiency s/p IVIG 2010 ----single infusion given  preventively after week of bilateral knee replacements at [facility]  2010  Fx left wrist in 1994  Blood clot at LUE in 2006 on a short course of Coumadin  Bilateral carpal tunnel syndrome s/p Lt carpal tunnel release and  steroids injection right--  Bone spur left foot  Scoliosis  Spinal stenosis s/p steroids injection  s/p  bilateral knee replacements for valgus /arthritic  complications, performed by Dr. Katrinka Blazing 2010--- never infected, but  packed with antibiotics with surgery;  Right rotator cuff repairs x 4, complicated by repeat tears,  infection, placement of anchor material  Elevated liver function tests  Diabetes  Seronegative RA  GI bleed  GERD  Pagets disease  Stage II fibrosis of Liver  .  ALLERGIES  .  yes[Allergies Verified]  .  SURGICAL HISTORY  .  Right rotator cuff repair, with infected hardware that had to be  removed 2006  Repeat right shoulder surgery, also which became infected. 2007  Left rotator cuff repair 2008  bilateral knee replacements 2009  Left arthroscopic carpal tunnel release 01/2011  ORIF Left 4th metatarsal bone  Left Ulna shortening 1994  Knee replacement 2010  Hand surgery 2013, 2015  remove gallbladder/hernia 1990  Plate left foot 3rd metatarsal 2008  Left reverse TSA 03/07/16  Left Hamstring Repair 05/2019  .  FAMILY HISTORY  .  Mother: deceased 48 yrs, lung cancer, hyperthyroidism, diagnosed  with Other malignant neoplasm of unspecified site  Father: deceased 58 yrs, heart attack, diagnosed with Unspecified  heart disease  Siblings: alive, Brother - scleroderma, COPD  3 brother(s) .  Denies family history of GI cancer or liver disease.  .  SOCIAL HISTORY  .  .  Tobacco  history: Never smoked.  .  Marital Status Single.  .  Work/Occupation: Out of work since 03/12/2020 due to current  conditions; Works for [employer] in Clinical biochemist.  .  Alcohol Former daily EtOH use in 20s.  .  Abuse/NeglectDo you feel unsafe in your relationships?No , Have  you ever been hit, kicked, punched or otherwise hurt by someone  in the past year? No , PlanResources Provided, Patient states  they feel safe to return home.  .  Illicit drugs: Denies.  [name]  HOSPITALIZATION/MAJOR DIAGNOSTIC PROCEDURE  .  as above  GI bleed 05/2019  .  REVIEW OF SYSTEMS  .  ORT:  .  Eyes    No . Ear, Nose Throat    No . Digestion, Stomach, Bowel     No .  Bladder Problems    No . Bleeding Problems    No .  Numbness/Tingling    No . Anxiety/Depression    No .  Fever/Chills/Fatigue    No . Chest Pain/Tightness/Palpitations     No . Skin Rash    No . Dental Problems    No . Joint/Muscle  Pain/Cramps    Yes . Blackout/Fainting    No . Other    No .  .  VITAL SIGNS  .  Pain scale 8,  Ht-in 62, Wt-lbs 155, BMI 28.35, BSA 1.75, Ht-cm  157.48, Wt-kg 70.31, Wt Change -2 lb.  .  PHYSICAL EXAMINATION  .  General: NAD Psych: Mood and affect appropriate HEENT: NC/AT CV:  warm and well perfused Pulm: breathing unlabored, normal chest  expansions Skin: No rash, swelling, ecchymoses, or erythemaGait:  difficulty without axillary crutches(+) TTP over right SIJ,  gluteus maximus, right piriformis, right TFL, GTB; left gluteus  maximus4+/5 motor strength B/L except 4/5 hip abductors on  rightSensation intact to light touch in all dermatomesReflex 1+  B/L patellar and Achilles(-) Gaenslen's test(-) SIJ compression  test.  .  ASSESSMENTS  .  Spinal stenosis of lumbar region with neurogenic claudication -  M48.062 (Primary)  .  Trochanteric bursitis, right hip - M70.61  .  Iliotibial band syndrome of right side - M76.31  .  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  [name] is a lovely 59 year old female found to have  multilevel spinal stenosis. [name] also has ITB tendinitis, as well  as greater trochanteric pain syndrome.-Pt educated regarding  diagnosises and treatment options -Imaging reviewed and discussed  with pt -Tiers of treatment discussed -Continue f/u with Dr  Dionisio David for possible spinal surgery, [name] is currently  undergoing CT work-up pre-op for planning.-Cool compresses or  warm heat to affected regions prn-Continue compound cream PRN  -[name] is not satisfied with [name] experience at [facility] and would  like a different opinion. I gave [name] information for [facility] and explained that they are normally a sports PT company, but  do have spine specialists and  can work on gait training with  Lofstrand crutches as well as consider other mobility  alternatives. This relationship may also be helpful in the  longrun if [name] undergoes surgery.-We had a brief discussion  regarding rolling walkers, rollators, manual and motorized  wheelchairs. [name] would like to see how [name] does with the  Lofstrand cruthces first. We did discuss that depending on  outcomes of surgery, [name] may need to consider other mobility  aides. [name] PCP has also considered sending rx for wheelchair and  [name] will f/u on this today.-[name] will speak with Dr [name] about  increasing or adjusting gabapentin dosing for better pain  control.-I will look into whether psychologist here at [facility],  Dr Barkley Boards, would have some availability to speak with  [name] due to all of the overwhelming feeling [name] is experiencing  with everything that is going on. [name] will also look into other  options in the meantime for psychotherapy.-Discussed red flag  symptoms such as focal neurologic deficit, bowel or bladder  dysfunction and to report to ED if they develop these  symptoms-Follow up PRNA total of 30 minutes was spent preparing  for and during this encounter including: review of imaging,  performing medically appropriate examination,  counseling/education regarding treatment plans/injections and  clinical documentation into the electronic health record.  .  FOLLOW UP  .  PRN  .  Electronically signed by [name] on 01/14/2020  at 02:34 PM EDT  .  Document electronically signed by [name]    .

## 2020-01-14 NOTE — Progress Notes (Signed)
 [name], [name] **DOB:** [birth year] (59 yo F) **Acc No.** [id number] **DOS:**  01/14/2020    ---       [name], [name]**    ------    80 Y old Female, DOB: April 19, 1960, External MRN: [id number]    Account Number: [id number]    [address], [place], [id number]    Home: [phone]    Guarantor: [name] Insurance: [id number] NHP PPO    PCP: [name] Frederick, MD Referring: [name] Frederick, MD External Visit ID: [id number]    [facility]        * * *    01/14/2020 Progress Notes: [name] **CHN#:** [id number]    ------    ---       **Current Medications**    ---    Taking    * Acetaminophen 500 MG Tablet 2 tablets as needed Orally every 8 hrs, Notes: as needed    ---    * Baclofen 10 MG Tablet 1/2 tab Orally prn, Notes: prn    ---    * Calcium Carbonate-Vitamin D 600-200 MG-UNIT Capsule 1 capsule with a meal Orally Twice a day    ---    * Famotidine 40 MG Tablet 1 tablet at bedtime Orally Once a day, Notes: prn    ---    * Gabapentin 300 MG Capsule 1 capsule Orally 3 times daily, Notes: only takes 1x prior to sleep due to side effects    ---    * Gammagard - Solution as directed Injection once every 4 weeks, Notes: every 4 weeks (infusion)    ---    * Glucophage XR 500 mg Tablet Extended Release 24 Hour 4 tablets Orally Once a day    ---    * Hydroxychloroquine Sulfate 200 MG Tablet TAKE 1 TABLET BY MOUTH EVERY DAY WITH FOOD OR MILK     ---    * Jardiance 10 MG Tablet take 1 tablet by mouth every day Orally Once a day    ---    * Lidocaine 5 % Patch 1 patch remove after 12 hours Externally PRN, Notes: prn    ---    * Lisinopril 2.5 MG Tablet 1 tablet Orally Once a day    ---    * Loftstrand crutches - 2 lofstrand crutches for ambulation daily    ---    * Mycophenolate Mofetil 500 MG Tablet 2 tablet Orally twice daily    ---    * Omeprazole 40 mg Capsule Delayed Release 1 capsule Orally twice daily    ---    * traMADol HCl 50 MG Tablet 1 tablet Orally every 6 hrs,  Notes: PRN - rarely takes    ---    * Ursodiol 500 MG Tablet 1 tablet Orally Twice a day    ---    * Vitamin B Complex - Tablet 1 tablet Orally once daily    ---    * Vitamin D 25 MCG (1000 UT) Tablet 1 tablet Orally Once a day    ---    Not-Taking/PRN    * glipiZIDE XL 5 MG Tablet Extended Release 24 Hour 1 tablet Orally Once a day    ---    * MiraLax 17 GM Packet 1 packet mixed with 8 ounces of fluid Orally Once a day    ---    * oxyCODONE HCl 5 MG Tablet 1 tablet as needed Orally every 4-6 hrs,  Notes: prn    ---    * predniSONE 10 mg Tablet 1 tablet Orally Once a day    ---    * Rituxan 500 MG/50ML Solution as directed Intravenous , Notes: Last dose 05/19/19    ---    Medication List reviewed and reconciled with the patient    ---     Past Medical History    ---      Scleroderma - CREST dx 2007.        ---    IgG4 deficiency s/p IVIG 2010 ----single infusion given preventively after  week of bilateral knee replacements at [facility] 2010.        ---    Fx left wrist in 1994.        ---    Blood clot at LUE in 2006 on a short course of Coumadin.        ---    Bilateral carpal tunnel syndrome s/p Lt carpal tunnel release and steroids  injection right--.        ---    Bone spur left foot.        ---    Scoliosis.        ---    Spinal stenosis s/p steroids injection.        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 Katrinka Blazing 2010--- never infected, but packed with antibiotics with  surgery;.        ---    Right rotator cuff repairs x 4, complicated by repeat tears, infection,  placement of anchor material.        ---    Elevated liver function tests.        ---    Diabetes.        ---    Seronegative RA.        ---    GI bleed .        ---    GERD.        ---    Pagets disease.        ---    Stage II fibrosis of Liver.        ---      **Surgical History**    ---      Right rotator cuff repair, with infected hardware that had to be removed  2006    ---    Repeat right  shoulder surgery, also which became infected. 2007    ---    Left rotator cuff repair 2008    ---    bilateral knee replacements 2009    ---    Left arthroscopic carpal tunnel release 01/2011    ---    ORIF Left 4th metatarsal bone    ---    Left Ulna shortening 1994    ---    Knee replacement 2010    ---    Hand surgery 2013, 2015    ---    remove gallbladder/hernia 1990    ---    Plate left foot 3rd metatarsal 2008    ---    Left reverse TSA 03/07/16    ---    Left Hamstring Repair 05/2019    ---      **Family History**    ---      Mother: deceased 44 yrs, lung cancer, hyperthyroidism, diagnosed with Other  malignant neoplasm of unspecified site    ---    Father: deceased 18 yrs,  heart attack, diagnosed with Unspecified heart  disease    ---    Siblings: alive, Brother - scleroderma, COPD    ---    3 brother(s) .    ---    Denies family history of GI cancer or liver disease.    ---      **Social History**    ---    Tobacco history: Never smoked.    Marital Status  Single.    Work/Occupation: Out of work since 03/12/2020 due to current conditions; Works  for [employer] in Clinical biochemist.    Alcohol  Former daily EtOH use in 20s.    Abuse/Neglect Do you feel unsafe in your relationships? No, Have you ever been  hit, kicked, punched or otherwise hurt by someone in the past year? No, Plan  Resources Provided, Patient states they feel safe to return home.    Illicit drugs: Denies.     **Hospitalization/Major Diagnostic Procedure**    ---      as above    ---    GI bleed 05/2019    ---      **Review of Systems**    ---     _ORT_ :    Eyes No. Ear, Nose Throat No. Digestion, Stomach, Bowel No. Bladder Problems  No. Bleeding Problems No. Numbness/Tingling No. Anxiety/Depression No.  Fever/Chills/Fatigue No. Chest Pain/Tightness/Palpitations No. Skin Rash No.  Dental Problems No. Joint/Muscle Pain/Cramps Yes. Blackout/Fainting No. Other  No.          **Reason for Appointment**    ---      1\. F/U - LBP, RIGHT LEG WEAKNESS     ---      **History of Present Illness**    ---     _General_ :    [name] for follow-up of right sided buttock and lateral leg pain. She  was last seen on 01-02-2020 at which time, she was recommended to start PT for  gait training with Lofstrand crutches. She started going to [facility], but was told she might do better with a rollator than  Lofstrand crutches; however, her concern is that she cannot walk like her  usual self, so she does not think she can use a rollator. She has also had  episodes of increased pain and weakness. She was recently started on  gabapentin 300mg , 300mg , 600mg  and feels this does not always provide enough  relief. She will sometimes take gabapentin and baclofen at nighttime, which  will help her sleep better. She gets drowsy with gabapentin during the day,  but is not driving or working, so it is manageable. Her appointment with  Neurosurgery has been moved up to see if she would be a candidate for surgery.    She is also having a hard time emotionally navigating all of the information  regarding her condition and is interested in talking with a psychologist or  therapist.      **Vital Signs**    ---    Pain scale 8, Ht-in 62, Wt-lbs 155, BMI **28.35** , BSA **1.75** , Ht-cm  157.48, Wt-kg **70.31** , Wt Change -2 lb.      **Physical Examination**    ---    General: NAD    Psych: Mood and affect appropriate    HEENT: NC/AT    CV: warm and well perfused    Pulm: breathing unlabored, normal chest expansions    Skin: No rash, swelling, ecchymoses, or erythema    Gait: difficulty  without axillary crutches    (+) TTP over right SIJ, gluteus maximus, right piriformis, right TFL, GTB;  left gluteus maximus    4+/5 motor strength B/L except 4/5 hip abductors on right    Sensation intact to light touch in all dermatomes    Reflex 1+ B/L patellar and Achilles    (-) Gaenslen's test    (-) SIJ compression test.      **Assessments**    ---    1\. Spinal stenosis of  lumbar region with neurogenic claudication - M48.062  (Primary)    ---    2\. Trochanteric bursitis, right hip - M70.61    ---    3\. Iliotibial band syndrome of right side - M76.31    ---    4\. Tendinopathy of gluteal region - M67.959    ---     [name] is a lovely 59 year old female found to have multilevel  spinal stenosis. She also has ITB tendinitis, as well as greater trochanteric  pain syndrome.    -Pt educated regarding diagnosises and treatment options     -Imaging reviewed and discussed with pt     -Tiers of treatment discussed     -Continue f/u with Dr Dionisio David for possible spinal surgery, she is currently undergoing CT work-up pre-op for planning.    -Cool compresses or warm heat to affected regions prn    -Continue compound cream PRN     -She is not satisfied with her experience at [facility] and would like a different opinion. I gave her information for [facility] and explained that they are normally a sports PT company, but do have spine specialists and can work on gait training with Lofstrand crutches as well as consider other mobility alternatives. This relationship may also be helpful in the longrun if she undergoes surgery.    -We had a brief discussion regarding rolling walkers, rollators, manual and motorized wheelchairs. She would like to see how she does with the Lofstrand cruthces first. We did discuss that depending on outcomes of surgery, she may need to consider other mobility aides. Her PCP has also considered sending rx for wheelchair and Zamani will f/u on this today.    -She will speak with Dr [name] about increasing or adjusting gabapentin dosing for better pain control.    -I will look into whether psychologist here at [facility], Dr Barkley Boards, would have some availability to speak with her due to all of the overwhelming feeling she is experiencing with everything that is going on. She will also look into other options in the meantime for psychotherapy.     -Discussed red flag symptoms such as focal neurologic deficit, bowel or bladder dysfunction and to report to ED if they develop these symptoms    -Follow up PRN    A total of 30 minutes was spent preparing for and during this encounter  including: review of imaging, performing medically appropriate examination,  counseling/education regarding treatment plans/injections and clinical  documentation into the electronic health record.    ---      **Follow Up**    ---    PRN    Electronically signed by [name] on 01/14/2020 at 02:34 PM EDT    Sign off status: Completed        * * *        [facility]    [address]    [address]    [place], [place] [address]  Tel: [phone]    Fax: [phone]              * * *          Progress Note: [name] 01/14/2020    ---    Note generated by eClinicalWorks EMR/PM Software ([email or web address])

## 2020-01-15 ENCOUNTER — Ambulatory Visit

## 2020-01-15 NOTE — Progress Notes (Signed)
 [facility] January 15, 2020  [address]   [place]  [place] [address]  Main: [phone]  Fax: [phone]  Patient Portal: [email or web address]            Fax Cover Sheet      To: [facility]   Time:   Company:   Fax Number: [phone]    From  Phone number: [phone]  Fax Number: [phone]    # of pages following: ________      Comments:    [name] [name] ANVERY  RAISED TOILET SEAT   SHOWER CHAIR    ----------------------  Confidentiality Notice  ----------------------    The documents accompanying this facsimile contain confidential information that may be legally protected by federal and state law. This information is intended for use only by the entity or individual to whom it is addressed. The authorized recipient is obligated to maintain this information in a safe, secure and confidential manner, and is prohibited from unauthorized use, disclosure, or failure to maintain confidentiality of this information, unless required to do so by law or regulation.    If you are neither the intended recipient nor the employee or agent of the intended recipient responsible for the delivery of this information, you are hereby notified that any improper disclosure, copying, use or distribution of this information is strictly prohibited. Please notify the sender immediately to arrange for the return of the transmitted documents or to verify destruction.            Created By Tod Persia RN on 01/15/2020 at 09:14 AM    Electronically Signed By Tod Persia RN on 01/15/2020 at 09:14 AM

## 2020-01-15 NOTE — Progress Notes (Signed)
 [facility] January 15, 2020  [address]   [place]  [place] [address]  Main: [phone]  Fax: [phone]  Patient Portal: [email or web address]            [name]  [address]  [place], [place]  [address]  [facility] Clinical Summary   [name]  DOB: [birth year]   MR#: [id number]  Primary Care Doctor: [name] -PC 4B-  [phone]    Summary of visit January 15, 2020 with Tod Persia, RN:       Labs, Tests and Referrals ordered:  <None>    Medications added or changed today include:  * SHOWER CHAIR USE AS INSTRUCTED  HT 62.5 WT 158.9 E11.9 -- M54.5 -- M89.9  * RAISED TOILET SEAT   * RAISED TOILET SEAT USE AS INSTRUCTED HT 62.5 WT 158.6 E11.9 -- M89.9 -- M54.5      Medications removed today include:    For a complete listing of all of your current medications, please see the last page of this Clinical Visit Summary.    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-Q21.1)  SCREENING EXAM FOR BREAST CANCER (ICD-V76.10) (ICD10-Z12.39)  ANEMIA, OTHER (ICD-285.8) (ICD10-D64.89)  LOWER EXTREMITY EDEMA (ICD-782.3) (ICD10-R60.0)  IRREGULAR HEART BEATS (ICD-427.9) (ICD10-I49.9)  GI BLEED (ICD-578.9) (KZS01-U93.2)  SCLERODERMA (ICD-710.1) (ICD10-M34.9)  SCREENING FOR CERVICAL CANCER (ICD-V76.2) (ATF57-D22.4)  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) (ICD10-M47.812)  NECK PAIN (ICD-723.1) (ICD10-M54.2)  STEROID USE, LONG TERM (ICD-V58.65) (ICD10-Z79.52)  PREMATURE  VENTRICULAR CONTRACTIONS (ICD-427.69) (ICD10-I49.3)  HYPERLIPIDEMIA (ICD-272.4) (ICD10-E78.5)  BACK PAIN, LUMBAR, CHRONIC (ICD-724.2) (ICD10-M54.5)  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)  DIABETES MELLITUS, TYPE II (ICD-250.00) (ICD10-E11.9)  LONG-TERM (CURRENT) USE OF STEROIDS (ICD-V58.65) (ICD10-Z79.51)  SKIN SAGGING DUE TO WEIGHT LOSS (ICD-757.9) (ICD10-Q84.9)  TRANSAMINASES, SERUM, ELEVATED (ICD-790.4) (ICD10-R74.0)  OBESITY, BMI 30-34.9, ADULT (ICD-278.00) (ICD10-E66.9)      DIABETES MELLITUS, TYPE II, UNCONTROLLED (ICD-250.02) (ICD10-E11.65)      FATTY LIVER DISEASE (ICD-571.8) (ICD10-K76.0)  SCLERODERMA, 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)  HYPOTHYROIDISM (ICD-244.9) (ICD10-E03.9)  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) (ICD10-D80.8)  SPINAL STENOSIS, 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)  HEPATITIS C EXPOSURE (HCV RNA NEGATIVE, 01/2014) (ICD-V02.62) (ICD10-Z20.5)  PAIN IN JOINT, HAND (ICD-719.44) (ICD10-M79.643)  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  For more information go to [email or web address]  Categories:   Underweight = <18.5   Normal weight = 18.5-24.9   Overweight = 25-29.9           Obesity = BMI of 30 or greater    Most recent blood tests ordered by your Primary Care team as of 01/15/2020:  Cholesterol Tests   Cholesterol 131 Normal = 110-199 06/06/2019   HDL (good  cholesterol) 67 Normal > 40 06/06/2019   Triglyceride 109 Normal = 40-250 06/06/2019   LDL (bad cholesterol) 42 Normal < 160  Normal < 130 if you have high blood pressure  Normal < 100 if you have heart disease or diabetes 06/06/2019   Diabetes Tests   Glucose random 377 Normal = 60-150 08/05/2015   HgbA1c 5.8 Normal = 4.3-5.6 11/06/2019   Blood Count Tests   Hematocrit 29.9 Normal = 37-47 06/06/2019   White Blood Cells 9.5 Normal = 4-11 06/06/2019   Platelets 341 Normal = 150-400 06/06/2019   Liver Tests   ALT 118 Normal = 0-54 08/05/2015   AST 47 Normal = 10-42 08/05/2015   Kidney Tests   BUN 15 Normal = 6-24 06/06/2019   Creatinine 0.73 Normal = 0.57-1.30 06/06/2019   GFR (Non African-American) 92 Normal > 60 06/06/2019   GFR (African-American) 106 Normal > 60 06/06/2019     General Medical Advice:  If you think any of the information provided in this summary is incorrect, please notify your provider.    Always carry a list of current medications with you . Provide an updated list to your Primary care Physician or any provider who prescribes your medication.    We have a doctor on call during our off hours--if you have an urgent problem and need to speak with a doctor when our office is closed, please call [phone].      [facility] Medication List  [facility]  [name]  DOB: Feb 24, 1961   MR#: [id number]  Primary Care Doctor: [name] -PC 4B-  [phone]    Medications on 01/15/2020:  HYDROXYCHLOROQUINE SULFATE 200 MG ORAL TABLET (HYDROXYCHLOROQUINE SULFATE) one tablet oral daily; Route: ORAL  JARDIANCE 10 MG ORAL TABLET (EMPAGLIFLOZIN) Take 1 tablet by mouth once daily; Route: ORAL  ROSUVASTATIN 10MG  TABLETS (ROSUVASTATIN CALCIUM) TAKE 1 TABLET BY MOUTH EVERY DAY  LISINOPRIL 2.5MG  TABLETS (LISINOPRIL) TAKE 1 TABLET BY MOUTH ONCE DAILY  METFORMIN HCL ER 500 MG XR24H-TAB (METFORMIN HCL) TAKE 4 TABLETS BY MOUTH EVERY MORNING  GLIPIZIDE ER 2.5 MG XR24H-TAB (GLIPIZIDE)  TAKE 1 TABLET BY MOUTH DAILY  OMEPRAZOLE 20 MG ORAL CAPSULE DELAYED RELEASE (OMEPRAZOLE) Take one capsule by mouth twice a day; Route: ORAL  BACLOFEN 10MG  TABLETS (BACLOFEN) TAKE 1/2 TABLET BY MOUTH EVERY DAY AS NEEDED FOR BACK OR SPASMS  DICLOFENAC SODIUM 1 % TRANSDERMAL GEL (DICLOFENAC SODIUM) APP 2 GRAMS EXT AA BID      FREESTYLE FREEDOM LITE w/Device KIT (BLOOD GLUCOSE MONITORING SUPPL) use as directed (ICD 10 E11.65)      FREESTYLE LITE BLOOD GLUCOSE STRIPS (GLUCOSE BLOOD) USE TO CHECK BLOOD GLUCOSE THREE TIMES DAILY      FREESTYLE LANCETS (LANCETS) check fingerstick three times a day (ICD 10 E11.65)  TRAMADOL HCL 50 MG ORAL TABLET (TRAMADOL HCL) take one tablet daily as needed for pain; Route: ORAL  OXYCODONE HCL 5 MG ORAL TABLET (OXYCODONE HCL) Take one tablet daily as needed for pain >8. Partial fill upon patient request.; Route: ORAL  CALCIUM CARBONATE-VITAMIN D 600-200 MG-UNIT ORAL TABLET (CALCIUM CARBONATE-VITAMIN D) take one tablet twice a day; Route: ORAL  GABAPENTIN 300MG  CAPSULES (GABAPENTIN) TAKE 1 CAPSULE BY MOUTH EVERY NIGHT AT BEDTIME AS NEEDED FOR PAIN  * BLOODWORK Please draw chemistry (sodium, potassium, magnesium, chloride, bicarbonate, glucose, creatinine, BUN), CBC, TSH, lipids, HbA1c. Fax result to [phone] Dr.  Mouli  FAMOTIDINE 20 MG TABLET (FAMOTIDINE) TAKE 1 TABLET BY MOUTH AT NIGHT AS NEEDED  LIDOCAINE 5 % PTCH (LIDOCAINE) APPLY ONE PATCH TO BACK ONCE DAILY AS NEEDED FOR PAIN, REMOVE AFTER 12 HRS  MYCOPHENOLATE MOFETIL 500 MG ORAL TABLET (MYCOPHENOLATE MOFETIL) Take 2 tablets twice daily; Route: ORAL  GAMMAGARD 20 GM/200ML INJECTION SOLUTION (IMMUNE GLOBULIN (HUMAN)) Receives infusion every 4 weeks, next dose 8/5; Route: INJECTION  URSODIOL 500 MG ORAL TABLET (URSODIOL) Take 1 tablet twice daily; Route: ORAL  * WHEEL CHAIR WITH FOOT PEDALS USE AS INSTRUCTED HT 62.5 WT 158.6 M54.5  M48.06  * SHOWER CHAIR USE AS INSTRUCTED  HT 62.5 WT 158.9 E11.9 -- M54.5 -- M89.9  * RAISED TOILET  SEAT   * RAISED TOILET SEAT USE AS INSTRUCTED HT 62.5 WT 158.6 E11.9 -- M89.9 -- M54.5    Allergies on 01/15/2020: (if it says "NKA" below, that means "No known allergies")  NKA    Advice about Medications:  Always carry a list of current medications with you . Provide an updated list to your Primary care Physician or any provider who prescribes your medication.    Remember to keep your list updated. Include all over the counter medications such as vitamins and herbals.    Discard all old medication lists.    If you are a specialist seeing this patient and you have made changes to any of the medications above,   please mark the changes on this medication list.    Many other factors can affect your health. If you are struggling with transportation, paying bills, getting food, getting work, then visit one of these websites:  http://[email or web address]  https://www.healthify.us/  https://www.1degree.org/      Created By Tod Persia RN on 01/15/2020 at 09:14 AM    Electronically Signed By Tod Persia RN on 01/15/2020 at 09:14 AM

## 2020-01-16 ENCOUNTER — Ambulatory Visit

## 2020-01-16 MED ORDER — TRAZODONE HCL: 1 | 30 | 0 refills | 0 days | Status: AC

## 2020-01-19 ENCOUNTER — Ambulatory Visit: Admitting: Physical Medicine & Rehabilitation

## 2020-01-19 ENCOUNTER — Ambulatory Visit

## 2020-01-19 NOTE — Progress Notes (Signed)
* * *      LATONGA, PONDER [name] **DOB:** 19-Aug-1960 (59 yo F) **Acc No.** [id number] **DOS:**  01/19/2020    ---        [name], Courtne [name]**    ------    89 Y old Female, DOB: January 02, 1961    [address], [place], [place] [id number]    Home: [phone]    [name]        * * *    Telephone Encounter    ---    Answered by    Court Joy    Date: 01/19/2020        Time: 11:40 AM    Caller    Helmut Muster - [facility]    ------            Reason    MRI Report            Message                      Hi Zella Ball from [facility] is calling in requesting the latest MRI report from 10/13. The fax number is [phone].                Action Taken                      [name]  01/21/2020 1:02:37 PM > no MRI on file; checked [facility] MRI online, no report either. Patient may have had an MRI done elsewhere.                     * * *                ---          * * *          ProviderGaynelle Adu, Sevin Langenbach 01/19/2020    ---    Note generated by eClinicalWorks EMR/PM Software ([email or web address])

## 2020-01-20 ENCOUNTER — Ambulatory Visit

## 2020-01-20 ENCOUNTER — Ambulatory Visit: Admitting: Rheumatology

## 2020-01-20 MED ORDER — TRAZODONE HCL: 1 | 30 | 0 refills | 0 days | Status: AC

## 2020-01-20 NOTE — Progress Notes (Signed)
* * *      [name], [name] **DOB:** November 29, 1960 (59 yo F) **Acc No.** [id number] **DOS:**  01/20/2020    ---        [name], [name]**    ------    20 Y old Female, DOB: 07/08/1960    [address], [place], [place] [id number]    Home: [phone]    Provider: [name]        * * *    Telephone Encounter    ---    Answered by    Arva Chafe    Date: 01/20/2020        Time: 08:18 AM    Reason    medical necessity?    ------            Message                      Pt left a voicemail this morning stating that the script wasn't going to be filled for it needed a fax of medical necessity?? May already be done? She's not too sure. Says to give the pharmacy a call. If it's already done then you can address this.                Action Taken                      [facility]  01/20/2020 1:12:22 PM > G#[phone]      [name],[name]  01/20/2020 1:19:29 PM > Refaxed with Dx code.  They will fill today.                    * * *                ---          * * *          Provider: [name] 01/20/2020    ---    Note generated by eClinicalWorks EMR/PM Software ([email or web address])

## 2020-01-21 ENCOUNTER — Ambulatory Visit

## 2020-01-23 ENCOUNTER — Ambulatory Visit: Admitting: Neurological Surgery

## 2020-01-23 ENCOUNTER — Ambulatory Visit

## 2020-01-23 ENCOUNTER — Ambulatory Visit: Admit: 2020-01-23

## 2020-01-26 ENCOUNTER — Ambulatory Visit: Admitting: Primary Care

## 2020-01-26 ENCOUNTER — Ambulatory Visit

## 2020-01-26 ENCOUNTER — Ambulatory Visit: Admitting: Neurological Surgery

## 2020-01-26 ENCOUNTER — Ambulatory Visit: Admitting: Student in an Organized Health Care Education/Training Program

## 2020-01-26 ENCOUNTER — Ambulatory Visit: Admit: 2020-01-26

## 2020-01-26 MED ORDER — Gabapentin: 300 | 120 | Freq: Three times a day (TID) | 0 refills | 0 days | Status: AC

## 2020-01-26 MED ORDER — Baclofen: 10 | Tablet | Freq: Two times a day (BID) | ORAL | 0 refills | 0 days | Status: AC

## 2020-01-26 NOTE — Progress Notes (Signed)
 .  Progress Notes  .  Patient: [name], [name] [facility]  Provider: Corinna Capra I   .  DOB: 1960/08/24 Age: 59 Y Sex: Female  .  PCP: Ruffin Frederick  MD  Date: 01/26/2020  .  --------------------------------------------------------------------------------  .  HISTORY OF PRESENT ILLNESS  .  Ambulatory Falls and Injury Prevention:  HPI  .  Have you experienced a fall in the past year?No falls in the past  year , Is the patient using assistive devices such as a cane or  walker?No , Do you need assistance with ambulation while at our  facility?No  .  .  Associated Providers:  c/o Primary Care Provider  Dr. Darrick Penna [facility]).  .  .  NEUROSURGERY:  59 year old female presents in  follow-up (last appointment 12/29/2019) with lower back pain and  right radiculopathy. 12/29/2019: She had an internal bleed in Feb.  2021. She initially thought she had a stomach bug. After 3 days  of inability to eat/drink, she presented to the hospital and was  told that she had an internal bleed. She had IVF and antibiotics.  She was seen by a gastro here at [facility] who per the patient did  not see any reason for further testing. She has continued to have  issues since this episode. She will have an upset stomach every  8-14 days (emesis and diarrhea). She has been working with a  nutritionist here at [employer] also. A lesion was found on her liver  during her hospitalization. She was told that she had Paget's  Disease. [name], her rheumatologist, believed that a lesion  to her right pelvis might be the cause of her symptoms. She had  scans completed. Since this episode, she started to notice  weakness to her lower extremities. She ruptured her right  hamstring in Dec. 2020. She had reconstructed surgery in Jan.  2021. She started to have pain down her right leg. She saw a PT  and a massage therapist. She was doing therapy on her own also.  The right leg weakness progressed. She had a lumbar injection in  Aug. 2021 at [facility] clinic. Today she  reports pain that radiates  from her right lower back into right lateral thigh to the ankle.  She presents today with crutches. She cannot walk more than 20  yards without crutches. She is unable to support her self. She  has needed crutches for 2 months. The symptoms are improved with  forward flexion at the waist.Over the last few days she has  started to have deep left lower extremity pain. She has been told  in the past that she has scoliosis and spinal stenosis. This was  diagnosed in 2011/2012. She has not had lumbar spine surgery. She  used to use an inversion table but since she moved she has not  been able to use it. 01/26/2020: She had new thoracic and lumbar  CT scans completed that will be reviewed with her today. Today  she reports that her lower back pain is significant and radiates  into her left hip. The pain will radiate up to her mid-back also.  She continues to have pain that radiates from her right hip to  her ankle. She reports the pain is so severe today that she is  nauseous. She notes that her mobility has decreased over the last  two weeks and an increase in her pain. She has been having  difficulty sleeping due to the pain. She  previous took oxycodone  and baclofen for her symptoms but does not currently have these  medications. She is currently in PT; 2x/week. She was prescribed  lofstrand crutches; she recently obtain the correct sized  crutches and is going to bring them to her PT tomorrow.  .  CURRENT MEDICATIONS  .  Taking Acetaminophen 500 MG Tablet 2 tablets as needed Orally  every 8 hrs, Notes: as needed  Taking Baclofen 10 MG Tablet 1 tab Orally BID, Notes: prn  Taking Calcium Carbonate-Vitamin D 600-200 MG-UNIT Capsule 1  capsule with a meal Orally Twice a day  Taking Famotidine 40 MG Tablet 1 tablet at bedtime Orally Once a  day, Notes: prn  Taking Gabapentin 300 MG Capsule 1 capsule Orally 3 times daily,  Notes: only takes 1x prior to sleep due to side effects  Taking Gammagard  - Solution as directed Injection once every 4  weeks, Notes: every 4 weeks (infusion)  Taking Glucophage XR 500 mg Tablet Extended Release 24 Hour 4  tablets Orally Once a day  Taking Hydroxychloroquine Sulfate 200 MG Tablet TAKE 1 TABLET BY  MOUTH EVERY DAY WITH FOOD OR MILK  Taking Jardiance 10 MG Tablet take 1 tablet by mouth every day  Orally Once a day  Taking Lidocaine 5 % Patch 1 patch remove after 12 hours  Externally PRN, Notes: prn  Taking Lisinopril 2.5 MG Tablet 1 tablet Orally Once a day  Taking Loftstrand crutches - 2 lofstrand crutches for ambulation  daily  Taking Mycophenolate Mofetil 500 MG Tablet 2 tablet Orally twice  daily  Taking Omeprazole 40 mg Capsule Delayed Release 1 capsule Orally  twice daily  Taking traMADol HCl 50 MG Tablet 1 tablet Orally every 6 hrs,  Notes: PRN - rarely takes  Taking Ursodiol 500 MG Tablet 1 tablet Orally Twice a day  Taking Vitamin B Complex - Tablet 1 tablet Orally once daily  Taking Vitamin D 25 MCG (1000 UT) Tablet 1 tablet Orally Once a  day  Not-Taking/PRN glipiZIDE XL 5 MG Tablet Extended Release 24 Hour  1 tablet Orally Once a day  Not-Taking/PRN MiraLax 17 GM Packet 1 packet mixed with 8 ounces  of fluid Orally Once a day  Not-Taking/PRN oxyCODONE HCl 5 MG Tablet 1 tablet as needed  Orally every 4-6 hrs, Notes: prn  Not-Taking/PRN predniSONE 10 mg Tablet 1 tablet Orally Once a day  Not-Taking/PRN Rituxan 500 MG/50ML Solution as directed  Intravenous , Notes: Last dose 05/19/19  Medication List reviewed and reconciled with the patient  .  PAST MEDICAL HISTORY  .  Scleroderma - CREST dx 2007  IgG4 deficiency s/p IVIG 2010 ----single infusion given  preventively after week of bilateral knee replacements at [facility]  2010  Fx left wrist in 1994  Blood clot at LUE in 2006 on a short course of Coumadin  Bilateral carpal tunnel syndrome s/p Lt carpal tunnel release and  steroids injection right--  Bone spur left foot  Scoliosis  Spinal stenosis s/p steroids  injection  s/p bilateral knee replacements for valgus /arthritic  complications, performed by Dr. Katrinka Blazing 2010--- never infected, but  packed with antibiotics with surgery;  Right rotator cuff repairs x 4, complicated by repeat tears,  infection, placement of anchor material  Elevated liver function tests  Diabetes  Seronegative RA  GI bleed  GERD  Pagets disease  Stage II fibrosis of Liver  .  ALLERGIES  .  yes[Allergies Verified]  .  SURGICAL HISTORY  .  Right rotator cuff repair, with infected hardware that had to be  removed 2006  Repeat right shoulder surgery, also which became infected. 2007  Left rotator cuff repair 2008  bilateral knee replacements 2009  Left arthroscopic carpal tunnel release 01/2011  ORIF Left 4th metatarsal bone  Left Ulna shortening 1994  Knee replacement 2010  Hand surgery 2013, 2015  remove gallbladder/hernia 1990  Plate left foot 3rd metatarsal 2008  Left reverse TSA 03/07/16  Left Hamstring Repair 05/2019  .  FAMILY HISTORY  .  Mother: deceased 45 yrs, lung cancer, hyperthyroidism, diagnosed  with Other malignant neoplasm of unspecified site  Father: deceased 7 yrs, heart attack, diagnosed with Unspecified  heart disease  Siblings: alive, Brother - scleroderma, COPD  3 brother(s) .  Denies family history of GI cancer or liver disease.  .  SOCIAL HISTORY  .  .  Tobacco  history: Never smoked.  .  Marital Status Single.  .  Work/Occupation: Out of work since 03/13/2019 due to current  conditions; Works for [employer] in Clinical biochemist.  .  Alcohol Former daily EtOH use in 20s.  .  Abuse/NeglectDo you feel unsafe in your relationships?No , Have  you ever been hit, kicked, punched or otherwise hurt by someone  in the past year? No , PlanResources Provided, Patient states  they feel safe to return home.  .  Illicit drugs: Denies.  [name]  HOSPITALIZATION/MAJOR DIAGNOSTIC PROCEDURE  .  as above  GI bleed 05/2019  .  VITAL SIGNS  .  Pain scale 8, Ht-in 62, Wt-lbs 155, BMI 28.35.  [name]  PHYSICAL  EXAMINATION  .  NEUROSURGERY:  MOTOR Lower Extremities  : 5/5 strength in the bilateral lower  extremities. :5/5 strength in the bilateral lower extremities.  Reflexes  Left Knee Jerk Absent, Right Knee Jerk Absent, Left  Ankle Jerk Absent no left achilles, Right Ankle Jerk 2+. Left  Knee JerkAbsent , Right Knee JerkAbsent , Left Ankle JerkAbsent  no left achilles , Right Ankle Jerk2+. Gait  : Antalgic uses  crutches. :Antalgic uses crutches.  DIAGNOSTIC STUDIES:  RADIOLOGY  I reviewed lumbar ap/lateral/flexion/extension xrays  completed 12/29/2019 at [facility]. I reviewed thoracic and lumbar  spine CTs completed 01/23/2020 at [facility].[name] MRI  Uploaded to PACS;  I reviewed lumbar spine MRI completed 12/16/2019 at [facility].  .  ASSESSMENTS  .  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  Acquired spondylolisthesis - M43.19  .  She had continued debilitating pain and cannot walk independently  at all. She has lofstrand crutches and she has a WC that she used  when she had her knee replacements and thinks she likely needs to  get it out of storage and use it again. When she tries to walk  independently in the office with assist, she can't do it, and  with my assistance she can stand for a very short period of time.  The CT is very helpful as it shows an autofusion of L5-S1 and  shows severe degenerative changes worst at the L4-5 level.  Reviewing the xrays, MRI, CT scan again, and reviewing all her  medical conditions and comorbidities, I think the operation that  makes the most sense in terms of "risk/benefit" ratio for her is  L4-5 TLIF surgery to address the worst area with severe stenosis  and spondylolisthesis with likely diminishing returns coming from  operating on other levels (i.e. adding other levels likely  increases risk without increasing benefit  enough)While I have  only known her for a short time, in talking to her, reviewing her  prior notes and being in touch with her other  providers and their  notes, I think this surgery realistically may help some of her  pain but isn't a "cure" and all the problems she has collectively  are likely going to end up with her needing a WC or scooter on  the near horizon, which I told her.I discussed the option of L45  TLIF with her in detail and she would like to proceed. It is  extremely high risk- she is at higher risks for complication  because of all the comorbidities and she understands this. The  patient lives alone; she prefers to go to rehab after surgery.  She has a gammagard IVIG injection this Friday (she gets the  injections monthly). This is administered by [name].Most  recent A1C is 6.1 (01/13/2020).  .  Electronically signed by Corinna Capra , MD on  02/04/2020 at 02:21 PM EDT  .  Document electronically signed by Dionisio David, RON I   .

## 2020-01-26 NOTE — Progress Notes (Signed)
* * *      NITI, LEISURE [name] **DOB:** 01-24-1961 (59 yo F) **Acc No.** [id number] **DOS:**  01/26/2020    ---        [name], Hye [name]**    ------    47 Y old Female, DOB: 1960/06/15    [address], [place], [place] [id number]    Home: [phone]    Provider: Denton Meek        * * *    Telephone Encounter    ---    Answered by    [name]    Date: 01/26/2020        Time: 11:40 AM    Reason    Pain    ------            Message                      Pt would like a call back from you. When asked for specifics she only said that she needs help. Best contact number above                 Action Taken                      [name],[name]  01/26/2020 11:41:21 AM >      Jasira Robinson  01/26/2020 12:23:52 PM >  Pt calling with increased pain.  She is discussing with her PCP but she was told by their office that they cannot give her her medications since she has upcoming surgery.  She is seeing him at 2:30 and will discuss with him pain management.  [name] and I are happy to treat her pain and stay in contact with her surgeon for preop and postop management.      Rosana Farnell  01/26/2020 5:13:25 PM > Pt LVM and I returned call-she is getting refills on her gabapentin and baclofen and also tylenol to control pain until surgery.  She will call us and let us know when surgery is booked.                    * * *                ---          * * *          Provider: Denton Meek 01/26/2020    ---    Note generated by eClinicalWorks EMR/PM Software ([email or web address])

## 2020-01-26 NOTE — Progress Notes (Signed)
 .  Progress Notes  .  Patient: [name], [name] [facility]  Provider: Corinna Capra I   .  DOB: Nov 14, 1960 Age: 59 Y Sex: Female  .  PCP: Ruffin Frederick  MD  Date: 01/26/2020  .  --------------------------------------------------------------------------------  .  HISTORY OF PRESENT ILLNESS  .  Ambulatory Falls and Injury Prevention:  HPI  .  Have you experienced a fall in the past year?No falls in the past  year , Is the patient using assistive devices such as a cane or  walker?No , Do you need assistance with ambulation while at our  facility?No  .  .  Associated Providers:  c/o Primary Care Provider  Dr. Darrick Penna [facility]).  .  .  NEUROSURGERY:  59 year old female presents in  follow-up (last appointment 12/29/2019) with lower back pain and  right radiculopathy. 12/29/2019: She had an internal bleed in Feb.  2021. She initially thought she had a stomach bug. After 3 days  of inability to eat/drink, she presented to the hospital and was  told that she had an internal bleed. She had IVF and antibiotics.  She was seen by a gastro here at [facility] who per the patient did  not see any reason for further testing. She has continued to have  issues since this episode. She will have an upset stomach every  8-14 days (emesis and diarrhea). She has been working with a  nutritionist here at [employer] also. A lesion was found on her liver  during her hospitalization. She was told that she had Paget's  Disease. [name], her rheumatologist, believed that a lesion  to her right pelvis might be the cause of her symptoms. She had  scans completed. Since this episode, she started to notice  weakness to her lower extremities. She ruptured her right  hamstring in Dec. 2020. She had reconstructed surgery in Jan.  2021. She started to have pain down her right leg. She saw a PT  and a massage therapist. She was doing therapy on her own also.  The right leg weakness progressed. She had a lumbar injection in  Aug. 2021 at [facility] clinic. Today she  reports pain that radiates  from her right lower back into right lateral thigh to the ankle.  She presents today with crutches. She cannot walk more than 20  yards without crutches. She is unable to support her self. She  has needed crutches for 2 months. The symptoms are improved with  forward flexion at the waist.Over the last few days she has  started to have deep left lower extremity pain. She has been told  in the past that she has scoliosis and spinal stenosis. This was  diagnosed in 2011/2012. She has not had lumbar spine surgery. She  used to use an inversion table but since she moved she has not  been able to use it. 01/26/2020: She had new thoracic and lumbar  CT scans completed that will be reviewed with her today. Today  she reports that her lower back pain is significant and radiates  into her left hip. The pain will radiate up to her mid-back also.  She continues to have pain that radiates from her right hip to  her ankle. She reports the pain is so severe today that she is  nauseous. She notes that her mobility has decreased over the last  two weeks and an increase in her pain. She has been having  difficulty sleeping due to the pain. She  previous took oxycodone  and baclofen for her symptoms but does not currently have these  medications. She is currently in PT; 2x/week. She was prescribed  lofstrand crutches; she recently obtain the correct sized  crutches and is going to bring them to her PT tomorrow.  .  CURRENT MEDICATIONS  .  Taking Acetaminophen 500 MG Tablet 2 tablets as needed Orally  every 8 hrs, Notes: as needed  Taking Baclofen 10 MG Tablet 1 tab Orally BID, Notes: prn  Taking Calcium Carbonate-Vitamin D 600-200 MG-UNIT Capsule 1  capsule with a meal Orally Twice a day  Taking Famotidine 40 MG Tablet 1 tablet at bedtime Orally Once a  day, Notes: prn  Taking Gabapentin 300 MG Capsule 1 capsule Orally 3 times daily,  Notes: only takes 1x prior to sleep due to side effects  Taking Gammagard  - Solution as directed Injection once every 4  weeks, Notes: every 4 weeks (infusion)  Taking Glucophage XR 500 mg Tablet Extended Release 24 Hour 4  tablets Orally Once a day  Taking Hydroxychloroquine Sulfate 200 MG Tablet TAKE 1 TABLET BY  MOUTH EVERY DAY WITH FOOD OR MILK  Taking Jardiance 10 MG Tablet take 1 tablet by mouth every day  Orally Once a day  Taking Lidocaine 5 % Patch 1 patch remove after 12 hours  Externally PRN, Notes: prn  Taking Lisinopril 2.5 MG Tablet 1 tablet Orally Once a day  Taking Loftstrand crutches - 2 lofstrand crutches for ambulation  daily  Taking Mycophenolate Mofetil 500 MG Tablet 2 tablet Orally twice  daily  Taking Omeprazole 40 mg Capsule Delayed Release 1 capsule Orally  twice daily  Taking traMADol HCl 50 MG Tablet 1 tablet Orally every 6 hrs,  Notes: PRN - rarely takes  Taking Ursodiol 500 MG Tablet 1 tablet Orally Twice a day  Taking Vitamin B Complex - Tablet 1 tablet Orally once daily  Taking Vitamin D 25 MCG (1000 UT) Tablet 1 tablet Orally Once a  day  Not-Taking/PRN glipiZIDE XL 5 MG Tablet Extended Release 24 Hour  1 tablet Orally Once a day  Not-Taking/PRN MiraLax 17 GM Packet 1 packet mixed with 8 ounces  of fluid Orally Once a day  Not-Taking/PRN oxyCODONE HCl 5 MG Tablet 1 tablet as needed  Orally every 4-6 hrs, Notes: prn  Not-Taking/PRN predniSONE 10 mg Tablet 1 tablet Orally Once a day  Not-Taking/PRN Rituxan 500 MG/50ML Solution as directed  Intravenous , Notes: Last dose 05/19/19  Medication List reviewed and reconciled with the patient  .  PAST MEDICAL HISTORY  .  Scleroderma - CREST dx 2007  IgG4 deficiency s/p IVIG 2010 ----single infusion given  preventively after week of bilateral knee replacements at [facility]  2010  Fx left wrist in 1994  Blood clot at LUE in 2006 on a short course of Coumadin  Bilateral carpal tunnel syndrome s/p Lt carpal tunnel release and  steroids injection right--  Bone spur left foot  Scoliosis  Spinal stenosis s/p steroids  injection  s/p bilateral knee replacements for valgus /arthritic  complications, performed by Dr. Katrinka Blazing 2010--- never infected, but  packed with antibiotics with surgery;  Right rotator cuff repairs x 4, complicated by repeat tears,  infection, placement of anchor material  Elevated liver function tests  Diabetes  Seronegative RA  GI bleed  GERD  Pagets disease  Stage II fibrosis of Liver  .  ALLERGIES  .  yes[Allergies Verified]  .  SURGICAL HISTORY  .  Right rotator cuff repair, with infected hardware that had to be  removed 2006  Repeat right shoulder surgery, also which became infected. 2007  Left rotator cuff repair 2008  bilateral knee replacements 2009  Left arthroscopic carpal tunnel release 01/2011  ORIF Left 4th metatarsal bone  Left Ulna shortening 1994  Knee replacement 2010  Hand surgery 2013, 2015  remove gallbladder/hernia 1990  Plate left foot 3rd metatarsal 2008  Left reverse TSA 03/07/16  Left Hamstring Repair 05/2019  .  FAMILY HISTORY  .  Mother: deceased 72 yrs, lung cancer, hyperthyroidism, diagnosed  with Other malignant neoplasm of unspecified site  Father: deceased 81 yrs, heart attack, diagnosed with Unspecified  heart disease  Siblings: alive, Brother - scleroderma, COPD  3 brother(s) .  Denies family history of GI cancer or liver disease.  .  SOCIAL HISTORY  .  .  Tobacco  history: Never smoked.  .  Marital Status Single.  .  Work/Occupation: Out of work since 03/13/2019 due to current  conditions; Works for [employer] in Clinical biochemist.  .  Alcohol Former daily EtOH use in 20s.  .  Abuse/NeglectDo you feel unsafe in your relationships?No , Have  you ever been hit, kicked, punched or otherwise hurt by someone  in the past year? No , PlanResources Provided, Patient states  they feel safe to return home.  .  Illicit drugs: Denies.  [name]  HOSPITALIZATION/MAJOR DIAGNOSTIC PROCEDURE  .  as above  GI bleed 05/2019  .  VITAL SIGNS  .  Pain scale 8, Ht-in 62, Wt-lbs 155, BMI 28.35.  [name]  PHYSICAL  EXAMINATION  .  NEUROSURGERY:  MOTOR Lower Extremities  : 5/5 strength in the bilateral lower  extremities. :5/5 strength in the bilateral lower extremities.  Reflexes  Left Knee Jerk Absent, Right Knee Jerk Absent, Left  Ankle Jerk Absent no left achilles, Right Ankle Jerk 2+. Left  Knee JerkAbsent , Right Knee JerkAbsent , Left Ankle JerkAbsent  no left achilles , Right Ankle Jerk2+. Gait  : Antalgic uses  crutches. :Antalgic uses crutches.  DIAGNOSTIC STUDIES:  RADIOLOGY  I reviewed lumbar ap/lateral/flexion/extension xrays  completed 12/29/2019 at [facility]. I reviewed thoracic and lumbar  spine CTs completed 01/23/2020 at [facility].[name] MRI  Uploaded to PACS;  I reviewed lumbar spine MRI completed 12/16/2019 at [facility].  .  ASSESSMENTS  .  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  Acquired spondylolisthesis - M43.19  .  She had continued debilitating pain and cannot walk independently  at all. She has lofstrand crutches and she has a WC that she used  when she had her knee replacements and thinks she likely needs to  get it out of storage and use it again. When she tries to walk  independently in the office with assist, she can't do it, and  with my assistance she can stand for a very short period of time.  The CT is very helpful as it shows an autofusion of L5-S1 and  shows severe degenerative changes worst at the L4-5 level.  Reviewing the xrays, MRI, CT scan again, and reviewing all her  medical conditions and comorbidities, I think the operation that  makes the most sense in terms of "risk/benefit" ratio for her is  L4-5 TLIF surgery to address the worst area with severe stenosis  and spondylolisthesis with likely diminishing returns coming from  operating on other levels (i.e. adding other levels likely  increases risk without increasing benefit  enough)While I have  only known her for a short time, in talking to her, reviewing her  prior notes and being in touch with her other  providers and their  notes, I think this surgery realistically may help some of her  pain but isn't a "cure" and all the problems she has collectively  are likely going to end up with her needing a WC or scooter on  the near horizon, which I told her.I discussed the option of L45  TLIF with her in detail and she would like to proceed. It is  extremely high risk- she is at higher risks for complication  because of all the comorbidities and she understands this. The  patient lives alone; she prefers to go to rehab after surgery.  She has a gammagard IVIG injection this Friday (she gets the  injections monthly). This is administered by [name].Most  recent A1C is 6.1 (01/13/2020).  .  Electronically signed by Corinna Capra , MD on  01/26/2020 at 05:56 PM EDT  .  Document electronically signed by Dionisio David, RON I   .

## 2020-01-26 NOTE — Progress Notes (Signed)
[name] [name], [name] [name] **DOB:** 1960/09/21 (59 yo F) **Acc No.** [id number] **DOS:**  01/26/2020    ---        [name] [name], [name] [name]**    ------    11 Y old Female, DOB: 1960/05/08, External MRN: [id number]    Account Number: [id number]    [address], [place], [id number]    Home: [phone]    Guarantor: [name] [name] Insurance: [id number] NHP PPO    PCP: [name] Frederick, MD Referring: [name] Frederick, MD External Visit ID: [id number]    Appointment Facility: Neurosurgery        * * *    01/26/2020    Progress Notes: [name] Capra, MD **CHN#:** [phone]    ------    ---        **Current Medications**    ---    Taking      * Acetaminophen 500 MG Tablet 2 tablets as needed Orally every 8 hrs, Notes: as needed     ---    * Baclofen 10 MG Tablet 1 tab Orally BID, Notes: prn     ---    * Calcium Carbonate-Vitamin D 600-200 MG-UNIT Capsule 1 capsule with a meal Orally Twice a day     ---    * Famotidine 40 MG Tablet 1 tablet at bedtime Orally Once a day, Notes: prn     ---    * Gabapentin 300 MG Capsule 1 capsule Orally 3 times daily, Notes: only takes 1x prior to sleep due to side effects     ---    * Gammagard - Solution as directed Injection once every 4 weeks, Notes: every 4 weeks (infusion)     ---    * Glucophage XR 500 mg Tablet Extended Release 24 Hour 4 tablets Orally Once a day     ---    * Hydroxychloroquine Sulfate 200 MG Tablet TAKE 1 TABLET BY MOUTH EVERY DAY WITH FOOD OR MILK     ---    * Jardiance 10 MG Tablet take 1 tablet by mouth every day Orally Once a day     ---    * Lidocaine 5 % Patch 1 patch remove after 12 hours Externally PRN, Notes: prn     ---    * Lisinopril 2.5 MG Tablet 1 tablet Orally Once a day     ---    * Loftstrand crutches - 2 lofstrand crutches for ambulation daily     ---    * Mycophenolate Mofetil 500 MG Tablet 2 tablet Orally twice daily     ---    * Omeprazole 40 mg Capsule Delayed Release 1 capsule Orally twice daily     ---    * traMADol HCl 50 MG Tablet 1 tablet Orally every  6 hrs, Notes: PRN - rarely takes     ---    * Ursodiol 500 MG Tablet 1 tablet Orally Twice a day     ---    * Vitamin B Complex - Tablet 1 tablet Orally once daily     ---    * Vitamin D 25 MCG (1000 UT) Tablet 1 tablet Orally Once a day     ---    Not-Taking/PRN    * glipiZIDE XL 5 MG Tablet Extended Release 24 Hour 1 tablet Orally Once a day     ---    * MiraLax 17 GM Packet 1 packet mixed with 8 ounces of fluid Orally Once  a day     ---    * oxyCODONE HCl 5 MG Tablet 1 tablet as needed Orally every 4-6 hrs, Notes: prn     ---    * predniSONE 10 mg Tablet 1 tablet Orally Once a day     ---    * Rituxan 500 MG/50ML Solution as directed Intravenous , Notes: Last dose 05/19/19     ---    Medication List reviewed and reconciled with the patient    ---      Past Medical History    ---      Scleroderma - CREST dx 2007.        ---    IgG4 deficiency s/p IVIG 2010 ----single infusion given preventively after  week of bilateral knee replacements at [facility] 2010.        ---    Fx left wrist in 1994.        ---    Blood clot at LUE in 2006 on a short course of Coumadin.        ---    Bilateral carpal tunnel syndrome s/p Lt carpal tunnel release and steroids  injection right--.        ---    Bone spur left foot.        ---    Scoliosis.        ---    Spinal stenosis s/p steroids injection.        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 Katrinka Blazing 2010--- never infected, but packed with antibiotics with  surgery;.        ---    Right rotator cuff repairs x 4, complicated by repeat tears, infection,  placement of anchor material.        ---    Elevated liver function tests.        ---    Diabetes.        ---    Seronegative RA.        ---    GI bleed .        ---    GERD.        ---    Pagets disease.        ---    Stage II fibrosis of Liver.        ---      **Surgical History**    ---      Right rotator cuff repair, with infected hardware that had to be removed  2006    ---     Repeat right shoulder surgery, also which became infected. 2007    ---    Left rotator cuff repair 2008    ---    bilateral knee replacements 2009    ---    Left arthroscopic carpal tunnel release 01/2011    ---    ORIF Left 4th metatarsal bone    ---    Left Ulna shortening 1994    ---    Knee replacement 2010    ---    Hand surgery 2013, 2015    ---    remove gallbladder/hernia 1990    ---    Plate left foot 3rd metatarsal 2008    ---    Left reverse TSA 03/07/16    ---    Left Hamstring Repair 05/2019    ---      **Family History**    ---  Mother: deceased 42 yrs, lung cancer, hyperthyroidism, diagnosed with Other  malignant neoplasm of unspecified site    ---    Father: deceased 72 yrs, heart attack, diagnosed with Unspecified heart  disease    ---    Siblings: alive, Brother - scleroderma, COPD    ---    3 brother(s) .    ---    Denies family history of GI cancer or liver disease.    ---      **Social History**    ---    Tobacco history: Never smoked.    Marital Status  Single.    Work/Occupation: Out of work since 03/13/2019 due to current conditions; Works  for [employer] in Clinical biochemist.    Alcohol  Former daily EtOH use in 20s.    Abuse/Neglect  Do you feel unsafe in your relationships? No  , Have you ever  been hit, kicked, punched or otherwise hurt by someone in the past year? No  ,  Plan Resources Provided, Patient states they feel safe to return home.    Illicit drugs: Denies.      **Hospitalization/Major Diagnostic Procedure**    ---      as above    ---    GI bleed 05/2019    ---        **History of Present Illness**    ---    _Ambulatory Falls and Injury Prevention_ :    HPI Have you experienced a fall in the past year? No falls in the past year,  Is the patient using assistive devices such as a cane or walker? No, Do you  need assistance with ambulation while at our facility? No.    _Associated Providers_ :    c/o Primary Care Provider  [name] [name] ([place])  .    _NEUROSURGERY_ :    59  year old female presents in follow-up (last appointment 12/29/2019) with  lower back pain and right radiculopathy.    12/29/2019: She had an internal bleed in Feb. 2021. She initially thought she  had a stomach bug. After 3 days of inability to eat/drink, she presented to  the hospital and was told that she had an internal bleed. She had IVF and  antibiotics. She was seen by a gastro here at [facility] who per the patient did  not see any reason for further testing. She has continued to have issues since  this episode. She will have an upset stomach every 8-14 days (emesis and  diarrhea). She has been working with a nutritionist here at [facility] also.    A lesion was found on her liver during her hospitalization. 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 [name], her rheumatologist, believed that a  lesion to her right pelvis might be the cause of her symptoms. She had scans  completed.    Since this episode, she started to notice weakness to her lower extremities.    She ruptured her right hamstring in Dec. 2020. She had reconstructed surgery  in Jan. 2021. She started to have pain down her right leg. She saw a PT and a  massage therapist. She was doing therapy on her own also. The right leg  weakness progressed. She had a lumbar injection in Aug. 2021 at [facility] clinic.    Today she reports pain that radiates from her right lower back into right  lateral thigh to the ankle. She presents today with crutches. She cannot walk  more than 20 yards  without crutches. She is unable to support her self. She  has needed crutches for 2 months. The symptoms are improved with forward  flexion at the waist.    Over the last few days she has started to have deep left lower extremity pain.    She has been told in the past that she has scoliosis and spinal stenosis. This  was diagnosed in 2011/2012. She has not had lumbar spine surgery. She used to  use an inversion table but since she moved she has not been able to use it.     01/26/2020: She had new thoracic and lumbar CT scans completed that will be  reviewed with her today.    Today she reports that her lower back pain is significant and radiates into  her left hip. The pain will radiate up to her mid-back also. She continues to  have pain that radiates from her right hip to her ankle. She reports the pain  is so severe today that she is nauseous.    She notes that her mobility has decreased over the last two weeks and an  increase in her pain. She has been having difficulty sleeping due to the pain.  She previous took oxycodone and baclofen for her symptoms but does not  currently have these medications.    She is currently in PT; 2x/week. She was prescribed lofstrand crutches; she  recently obtain the correct sized crutches and is going to bring them to her  PT tomorrow.      **Vital Signs**    ---    Pain scale 8, Ht-in 62, Wt-lbs 155, BMI  **28.35** .      **Physical Examination**    ---    _NEUROSURGERY_ :    MOTOR Lower Extremities  :  5/5 strength in the bilateral lower extremities  .  Reflexes  Left Knee Jerk  Absent  ,  Right Knee Jerk  Absent  ,  Left Ankle  Jerk  Absent no left achilles  ,  Right Ankle Jerk  2+  . Gait  :  Antalgic  uses crutches  .    _DIAGNOSTIC STUDIES_ :    RADIOLOGY  I reviewed lumbar ap/lateral/flexion/extension xrays completed  12/29/2019 at [facility]. I reviewed thoracic and lumbar spine CTs completed  01/23/2020 at [facility].  [name] MRI  Uploaded to PACS; I reviewed lumbar spine MRI  completed 12/16/2019 at [facility]  .          **Assessments**    ---    1\. 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 Acquired spondylolisthesis - M43.19    ---      She had continued debilitating pain and cannot walk independently at all.  She has lofstrand crutches and she has a WC that she used when she had her  knee replacements and thinks she likely needs to get it out of storage and use  it again. When she tries to walk  independently in the office with assist, she  can't do it, and with my assistance she can stand for a very short period of  time. The CT is very helpful as it shows an autofusion of L5-S1 and shows  severe degenerative changes worst at the L4-5 level. Reviewing the xrays, MRI,  CT scan again, and reviewing all her medical conditions and comorbidities, I  think the operation that makes the most sense in terms of "risk/benefit" ratio  for  her is L4-5 TLIF surgery to address the worst area with severe stenosis  and spondylolisthesis with likely diminishing returns coming from operating on  other levels (i.e. adding other levels likely increases risk without  increasing benefit enough)    While I have only known her for a short time, in talking to her, reviewing her  prior notes and being in touch with her other providers and their notes, I  think this surgery realistically may help some of her pain but isn't a "cure"  and all the problems she has collectively are likely going to end up with her  needing a WC or scooter on the near horizon, which I told her.    I discussed the option of L45 TLIF with her in detail and she would like to  proceed. It is extremely high risk- she is at higher risks for complication  because of all the comorbidities and she understands this. The patient lives  alone; she prefers to go to rehab after surgery.    She has a gammagard IVIG injection this Friday (she gets the injections  monthly). This is administered by [name].    Most recent A1C is 6.1 (01/13/2020).    ---    Electronically signed by [name] [name] , MD on 02/04/2020 at 02:21 PM EDT    Sign off status: Completed        * * *        Neurosurgery    [address] [place], [address]    [place], [place] [address]    Tel: [phone]    Fax: [phone]              * * *          Progress Note: [name] Capra, MD 01/26/2020    ---    Note generated by eClinicalWorks EMR/PM Software ([email or web address])

## 2020-01-26 NOTE — Progress Notes (Signed)
* * *      MARYLAN, GLORE [name] **DOB:** 10/05/1960 (59 yo F) **Acc No.** [id number] **DOS:**  01/26/2020    ---        [name], Anagabriela [name]**    ------    [name], DOB: May 30, 1960    [address], [place], [place] [id number]    Home: [phone]    Provider: Verdon Cummins        * * *    Telephone Encounter    ---    Answered by    Verdon Cummins    Date: 01/26/2020        Time: 03:51 PM    Refills    Refill Baclofen Tablet, 10 MG, Orally, 60 Tablet, 1 tab, BID, 30  days, Refills=0    ------      Refill Gabapentin Capsule, 300 MG, Orally, 120, 1 capsule BID and 2 tablets  prior to sleep, TID, Refills=0          * * *                ---          * * *          Provider: Verdon Cummins 01/26/2020    ---    Note generated by eClinicalWorks EMR/PM Software ([email or web address])

## 2020-01-26 NOTE — Telephone Encounter (Signed)
 Call Details:   Patient PCP = [name] -PC 4B-  [name] (Patient) called on January 26, 2020 9:14 AM.  Message taken by: Shelva Majestic  Primary call-back number: [phone]    Secondary call-back number: () -    Call Reason(s): Message/Call-Back      ** MESSAGE / CALL-BACK.  Regarding: Pt would like a call back in regards pain medication. please call pt back .    ---------- ---------- ---------- ---------- ---------- ----------       RESPONSE/ORDERS:    pt calling requesting  pain medicatiot  Oxyxodone  staates that she  rec it intermittantly but has been having increase pain across her back    and radiation to  RIGHT  leg and starting to  radiatie to her LEFT  pt has an appointmetn today with neurosurg at 2:30  and will also address with them during appointment   would like PCPinput and a plan going forward  ......................................Marland KitchenTod Persia, RN  January 26, 2020 11:36 AM    Per Dr. Albesa Seen she will have surgery and opioids will be deferred until after her surgery for best effect. 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-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.1)  SCREENING EXAM FOR BREAST CANCER (ICD-V76.10) (ICD10-Z12.39)  ANEMIA, OTHER (ICD-285.8) (ICD10-D64.89)  LOWER EXTREMITY EDEMA (ICD-782.3) (ICD10-R60.0)  IRREGULAR HEART BEATS (ICD-427.9) (ICD10-I49.9)  GI BLEED (ICD-578.9) (EKI63-G94.2)  SCLERODERMA (ICD-710.1)  (ICD10-M34.9)  SCREENING FOR CERVICAL CANCER (ICD-V76.2) (JSI73-F58.4)  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) (ICD10-M47.812)  NECK PAIN (ICD-723.1) (ICD10-M54.2)  STEROID USE, LONG TERM (ICD-V58.65) (ICD10-Z79.52)  PREMATURE VENTRICULAR CONTRACTIONS (ICD-427.69) (ICD10-I49.3)  HYPERLIPIDEMIA (ICD-272.4) (ICD10-E78.5)  BACK PAIN, LUMBAR, CHRONIC (ICD-724.2) (ICD10-M54.5)  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)  DIABETES MELLITUS, TYPE II (ICD-250.00) (ICD10-E11.9)  LONG-TERM (CURRENT) USE OF STEROIDS (ICD-V58.65) (ICD10-Z79.51)  SKIN SAGGING DUE TO WEIGHT LOSS (ICD-757.9) (ICD10-Q84.9)  TRANSAMINASES, SERUM, ELEVATED (ICD-790.4) (ICD10-R74.0)  OBESITY, BMI 30-34.9, ADULT (ICD-278.00) (ICD10-E66.9)      DIABETES MELLITUS, TYPE II, UNCONTROLLED (ICD-250.02) (ICD10-E11.65)      FATTY LIVER DISEASE (ICD-571.8) (ICD10-K76.0)  SCLERODERMA, 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)  HYPOTHYROIDISM (ICD-244.9) (ICD10-E03.9)  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) (ICD10-D80.8)  SPINAL STENOSIS, 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)  HEPATITIS C EXPOSURE (HCV RNA NEGATIVE, 01/2014) (ICD-V02.62) (ICD10-Z20.5)  PAIN IN JOINT, HAND (ICD-719.44) (ICD10-M79.643)  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)  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)  CARPAL TUNNEL (ICD-354.0) (ICD10-G56.00)  OSTEOARTHRITIS (ICD-715.09) (ICD10-M15.9)  S/P ROTATOR CUFF SURGERY 12/2004 - RT SHOULDER; 2008 LT (ICD-V45.89)  Family Hx of MELANOMA, FAMILY HX (ICD-V16.8) (ICD10-Z80.8)  FRACTURE OF OTHER SPEC SITE,  PATHOLOGIC - MULTIPLE (ICD-733.19)  CHOLECYSTECTOMY AND HERNIA REPAIR (ICD-V45.89)  VITAMIN D DEFICIENCY (ICD-268.9) (ICD10-E55.9)  COLONOSCOPY, NEXT 2020- SEE COMMENT (ICD-V76.51) (ICD10-Z01.89)    Meds (prior to this call):   HYDROXYCHLOROQUINE SULFATE 200 MG ORAL TABLET (HYDROXYCHLOROQUINE SULFATE) one tablet oral daily; Route: ORAL  JARDIANCE 10 MG ORAL TABLET (EMPAGLIFLOZIN) Take 1 tablet by mouth once daily; Route:  ORAL  ROSUVASTATIN 10MG  TABLETS (ROSUVASTATIN CALCIUM) TAKE 1 TABLET BY MOUTH EVERY  DAY  LISINOPRIL 2.5MG  TABLETS (LISINOPRIL) TAKE 1 TABLET BY MOUTH ONCE DAILY  METFORMIN HCL ER 500 MG XR24H-TAB (METFORMIN HCL) TAKE 4 TABLETS BY MOUTH EVERY MORNING  GLIPIZIDE ER 2.5 MG XR24H-TAB (GLIPIZIDE) TAKE 1 TABLET BY MOUTH DAILY  OMEPRAZOLE 20 MG ORAL CAPSULE DELAYED RELEASE (OMEPRAZOLE) Take one capsule by mouth twice a day; Route: ORAL  BACLOFEN 10MG  TABLETS (BACLOFEN) TAKE 1/2 TABLET BY MOUTH EVERY DAY AS NEEDED FOR BACK OR SPASMS  DICLOFENAC SODIUM 1 % TRANSDERMAL GEL (DICLOFENAC SODIUM) APP 2 GRAMS EXT AA BID      FREESTYLE FREEDOM LITE w/Device KIT (BLOOD GLUCOSE MONITORING SUPPL) use as directed (ICD 10 E11.65)      FREESTYLE LITE BLOOD GLUCOSE STRIPS (GLUCOSE BLOOD) USE TO CHECK BLOOD GLUCOSE THREE TIMES DAILY      FREESTYLE LANCETS (LANCETS) check fingerstick three times a day (ICD 10 E11.65)  TRAMADOL HCL 50 MG ORAL TABLET (TRAMADOL HCL) take one tablet daily as needed for pain; Route: ORAL  OXYCODONE HCL 5 MG ORAL TABLET (OXYCODONE HCL) Take one tablet daily as needed for pain >8. Partial fill upon patient request.; Route: ORAL  CALCIUM CARBONATE-VITAMIN D 600-200 MG-UNIT ORAL TABLET (CALCIUM CARBONATE-VITAMIN D) take one tablet twice a day; Route: ORAL  GABAPENTIN 300MG  CAPSULES (GABAPENTIN) TAKE 1 CAPSULE BY MOUTH EVERY NIGHT AT BEDTIME AS NEEDED FOR PAIN  * BLOODWORK Please draw chemistry (sodium, potassium, magnesium, chloride, bicarbonate, glucose, creatinine, BUN), CBC, TSH, lipids, HbA1c. Fax result to [phone] Dr. Marlane Hatcher  FAMOTIDINE 20 MG TABLET (FAMOTIDINE) TAKE 1 TABLET BY MOUTH AT NIGHT AS NEEDED  LIDOCAINE 5 % PTCH (LIDOCAINE) APPLY ONE PATCH TO BACK ONCE DAILY AS NEEDED FOR PAIN, REMOVE AFTER 12 HRS  MYCOPHENOLATE MOFETIL 500 MG ORAL TABLET (MYCOPHENOLATE MOFETIL) Take 2 tablets twice daily; Route: ORAL  GAMMAGARD 20 GM/200ML INJECTION SOLUTION (IMMUNE GLOBULIN (HUMAN)) Receives infusion every 4  weeks, next dose 8/5; Route: INJECTION  URSODIOL 500 MG ORAL TABLET (URSODIOL) Take 1 tablet twice daily; Route: ORAL  * WHEEL CHAIR WITH FOOT PEDALS USE AS INSTRUCTED HT 62.5 WT 158.6 M54.5  M48.06  * SHOWER CHAIR USE AS INSTRUCTED  HT 62.5 WT 158.9 E11.9 -- M54.5 -- M89.9  * RAISED TOILET SEAT   * RAISED TOILET SEAT USE AS INSTRUCTED HT 62.5 WT 158.6 E11.9 -- M89.9 -- M54.5  TRAZODONE HCL 50 MG ORAL TABLET (TRAZODONE HCL) Take 1 tab 30 min before bedtime for insomnia; Route: ORAL  * LOFT STAND CRUTCHES USE AS INSTRUCTED  HT 62.5 WT 158.6 M54.4 -- R60.0 -- M89.9            Created By Shelva Majestic on 01/26/2020 at 09:14 AM    Electronically Signed By Darrick Penna MD on 01/27/2020 at 08:32 AM

## 2020-01-28 ENCOUNTER — Ambulatory Visit: Admitting: Rheumatology

## 2020-01-28 NOTE — Progress Notes (Signed)
[name], [name] **DOB:** 29-Dec-1960 (59 yo F) **Acc No.** [id number] **DOS:**  01/28/2020    ---        [name], [name]**    ------    68 Y old Female, DOB: 1960-12-03    [address], [place], [place] [address]    Home: [phone]    Provider: [name]        * * *    Telephone Encounter    ---    Answered by    [name]    Date: 01/28/2020        Time: 11:14 AM    Reason    *Miqueas Whilden-hands worsening    ------            Message                      Pt is scheduled for surgery 11/23. Would like a call back.                 Action Taken                      [name],[name]  01/28/2020 11:15:05 AM > maybe discuss palliative care with her.      [name],[name]  01/28/2020 11:22:29 AM > Surgery scheduled for 11/23.  Emailing with surgeon and Dr. Zola Button to coordinate.  Also will discuss increase in skin tightening, raynauds and cracks with Jewel Mcafee-no ulcerations per pt.      [name],[name]  01/29/2020 2:19:39 PM > IVIG last was 10/1, next is 10/29.  After that would 11/26.      [name],[name]  02/02/2020 4:02:44 PM > Both surgeon and [name] recommend IVIG infusion be given at home on 11/19-prior to surgery.  I am calling [facility] now.      [name],[name]  02/02/2020 4:15:32 PM > LVMx1 for [facility] nurses line to schedule infusion and discuss insurance details.  [facility] infusion nursing line [phone].      [name],[name]  02/04/2020 10:33:20 AM > Rondel Jumbo of nursing for contracted cpompanyis going to give me a call to set up early infusion on 11/19.      [name],[name]  02/04/2020 11:26:55 AM > [facility]-P#[phone], Chrissie Noa Lida Berkery-prescriber.      [name],[name]  02/04/2020 2:16:03 PM > [facility] connecting me to [facility] nursing-they took message and will call me back.      [name],[name]  02/09/2020 11:52:15 AM > I have tried calling [facility] x4 and no one has been able to help me.  I have asked the pt to call them and schedule her at home infusion for 11/19?  If they need authorization from [place] I  need to know who to talk to.  This is very important she get it that day before surgery to decrease risk of infection.      [name],[name]  02/12/2020 9:13:32 AM > Can you call her and see if she got my message about scheduling her IVIG infusion for 11/19?      [name],[name]  02/12/2020 9:51:30 AM > She has it schedule for 11/19. She wants you to know that her hands are very bad right now. She is very sensitive to cold and in a lot of pain right now. She can barely use them as it is very painful and they are very swollen.   She does know she can't change her meds right now but wants to keep you informed about her condition.  [name],[name]  02/12/2020 3:08:33 PM >She called back letting us know that she found an abcess on her left leg about two inches abve her ankle about the size of a gold ball.  She had it drained and urgent care and was prescribed bactrim- abx 2 tabs twice daily for 7 days.      CHUI,TRAVIS  02/13/2020 6:45:56 PM > can f/u next week                    * * *                ---          * * *          Provider: [name] 01/28/2020    ---    Note generated by eClinicalWorks EMR/PM Software ([email or web address])

## 2020-01-29 ENCOUNTER — Ambulatory Visit

## 2020-01-29 NOTE — Progress Notes (Signed)
* * *      [name], [name] **DOB:** [birth year] (59 yo F) **Acc No.** [id number] **DOS:**  01/29/2020    ---        [name], [name]**    ------    69 Y old Female, DOB: [birth year]    [address], [place], [place] [address]    Home: [phone]    Provider: Barkley Boards        * * *    Telephone Encounter    ---    Answered by    Barkley Boards    Date: 01/29/2020        Time: 02:37 PM    Caller    Provider    ------            Reason    Schedule Intake            Message                      Called patient and spoke to her, we agreed to meet on 02/09/20 @ 1pm IN OFFICE for psychotherapy intake per refferal from her PCP                Action Taken                      Called patient and spoke to her, we agreed to meet on 02/09/20 @ 1pm IN OFFICE for psychotherapy intake per refferal from her PCP      [name]  01/29/2020 2:39:04 PM >                     * * *                ---          * * *          Provider: Barkley Boards 01/29/2020    ---    Note generated by eClinicalWorks EMR/PM Software ([email or web address])

## 2020-02-02 ENCOUNTER — Ambulatory Visit: Admitting: Physical Medicine & Rehabilitation

## 2020-02-02 HISTORY — PX: LUMBAR FUSION: SHX111

## 2020-02-02 HISTORY — PX: LUMBAR LAMINECTOMY: SHX95

## 2020-02-02 NOTE — Progress Notes (Signed)
* * *      [name], [name] **DOB:** [birth year] (59 yo F) **Acc No.** [id number] **DOS:**  02/02/2020    ---        [name], [name]**    ------    46 Y old Female, DOB: 06-May-1960    [address], [place], [place] [id number]    Home: [phone]    [name]        * * *    Telephone Encounter    ---    Answered by    Dondra Prader    Date: 02/02/2020        Time: 03:00 PM    Message                      [name] called the office is looking to speak with you as sson as possible. She is having mobility issues and has some questions. CB#[phone]        ------            Action Taken                      [facility]  02/02/2020 3:01:33 PM > sent to MD      [facility]  02/02/2020 3:32:01 PM > Spoke with [name]. PCP would like to consolidate all mobility devices through PM&R because of insurance coverage issues. We will have her be seen by the wheelchair eval clinic at [facility] and then get a letter of medical necessity. She is also working on getting shower chair and has [employer]strand crutches. We will mail the wheelchair eval rx to her.                    * * *              * * *        ---        Assessments    ---    1\. 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 Impaired gait and mobility - R26.89    ---      Treatment    ---      **1\. Spinal stenosis of lumbar region with neurogenic claudication**    _PROCEDURE: Physical Therapy Consult_     Wheelchair evaluation clinic    ------        **2\. Impaired gait and mobility**    _PROCEDURE: Physical Therapy Consult_     Wheelchair evaluation clinic    ------          * * *          [name], Karnell Vanderloop 02/02/2020    ---    Note generated by eClinicalWorks EMR/PM Software ([email or web address])

## 2020-02-03 ENCOUNTER — Ambulatory Visit

## 2020-02-03 ENCOUNTER — Ambulatory Visit: Admitting: Internal Medicine

## 2020-02-03 ENCOUNTER — Ambulatory Visit: Admit: 2020-02-03

## 2020-02-03 NOTE — Progress Notes (Signed)
 Medications:  TRAZODONE HCL 100 MG ORAL TABLET (TRAZODONE HCL) Take 1 tablet at bedtime    #30[Tablet] x 3      Route:ORAL      Entered and Authorized by:  [name] -PC 4B-      Signed by:  [name] -PC 4B- on 02/03/2020      Method used:    [facility]               [facility] [place] [facility]  [address]  [place], [place]    Ph: [phone]  Fax: [phone]      Note to Pharmacy: Route: ORAL;       RxID:   [id number]  OXYCODONE HCL 5 MG ORAL TABLET (OXYCODONE HCL) Take one tablet daily as nee  ded for pain >8. Partial fill upon patient request.  #10[Tablet] x 0      Route:ORAL      Entered and Authorized by:  [name] -PC 4B-      Signed by:  [name] -PC 4B- on 02/03/2020      Method used:    [facility]               [facility] [place] [facility] [address]  [address]  [place], [place]    Ph: [phone]  Fax: [phone]      Note to Pharmacy: Route: ORAL;       RxID:   [id number]  .  General Medicine Visit - Resident note with preceptor addendum  .  .  .  * Patterson: Perkins (Tiffany)  .  Patient Details and Vitals  Patient reviewed in/by:  Office  Patient Best Phone # [phone]  Patient Email Address [name]  .  BMI: 28.29  .  BP: 113/ 74  Ht (inches): 62.5  Weight: 156.6  BMI: 28.29  Temp: 97.9  Pulse: 95  O2 Sat: 97  .  Med List: PRINTED by [facility] for patient   hd_medl: printed  .  [name]  Patient Medical History   Travel outside of the [place] in past 28 days:: No  .  In the past year, have you ... Had no falls  Difficulty with balance? YES  Use a Cane NO  Use a Walker NO  Need assistance with ambulation while here? NO  .  Falls Intervention - Exam Room Assist to Room  Falls Intervention - Provider Notify provider of fall risk  Falls Intervention - Environment Ensure environment is free from obstacles  .  COVID Patient Symptom Screening   Note any symptoms in the prior 10 days:   In past 14 days, exposure to anyone with above symptoms or positive test?    COVID-19 Exposure: No  In past 14 days, told by public health authoirty or healthcare professional   to quarantine?   COVID-19 Quarantine: No  COVID Vaccine; remind availability for walk-in vaccines. If NO, patient mus  t wear mask..   Been vaccinated for COVID-19: Yes  Tobacco use? never smoker  Does patient experience chronic pain ? YES  Severity of pain? (min=0, max=10) 6  .  Patient Screening   .  SDOH: Housing situation: I have housing  SDOH: Run out or ability to purchase food: Never true  SDOH: Utility shutoff: No  SDOH: Miss appt lack transportation: No  SDOH: unemployed or looking for work: No  SDOH: Socially withdrawn: Never  .  .  .  PHQ2 (Q1) Little  Interest or pleasure in doing things: 0  PHQ2 (Q2) Feeling down, depressed or hopeless: 0  PHQ2 Score: 0  .  Abuse/Neglect (Q1) Feel unsafe in relationship: NO  Abuse/Neglect (Q2) Hurt in past year: NO  .  .  .  .  .  Data to be shared to Telemed/Office Visits   February 03, 2020 1:55 PM  Screening [place]: [facility])  Patient is a falls risk  Fall interventions = Assist to Room, Notify provider of fall risk, and Ensu  re environment is free from obstacles  Chronic Pain, level = 6  PHQ2 Score: 0  .  ---------- ---------- ----------   ......................................Marland KitchenThereasa Parkin  February 03, 2020 1  :55 PM  .  .  .  .  **Resident Note**  .  .  Patient Prep Updates   Nellie Pre-Visit Notes:  February 03, 2020 1:55 PM  Screening [place]: [facility])  Patient is a falls risk  Fall interventions = Assist to Room, Notify provider of fall risk, and Ensu  re environment is free from obstacles  Chronic Pain, level = 6  PHQ2 Score: 0  .  ---------- ---------- ----------   ......................................Marland KitchenThereasa Parkin  February 03, 2020 1  :55 PM  .  .  .  .  .  .  .  .  .  * Preceptor: Dorene Grebe [facility])  CC: Annual Exam  .  HPI: SCLERODERMA  SCOLIOSIS RELATED BACK-PAIN  - continues to deal with severe chronic back pain - recently saw Dr.  Delight Stare  berger in clinic and the plan is for surgery on November 23rd.  - she continues to take Baclofen, Tylenol, Gabapentin  - she is on board with the plan to hold off on opioids until post-operative   to prevent pre-op tolerance although she is still hesitant to use opioids  - continues to work with physical therapy  - had one episode of moderately-severe Raynauds of the right ring finger wi  th resultant paleness and loss of sensation for 24 hours; since recovered  .  [name]  Past Medical History:(reviewed)  -Screening:  --HIV negative (2015), Hepatitis C antibody negative (07/2019)  --Pap + HPV: Normal/Negative 01/17/2019  --Mammo (40-74): Negative (09/2019), due in 2023  --CRC: Completed 05/2019, due in 2026  --DEXA (>65, HR):  Normal bone mass (02/01/2018), next due 2022  .  -Vaccines:  --Flu: Given 02/03/2020  --TDaP: Given 11/04/2014  --Pneumovax: PPSV23 01/26/2017. Due for booster at age 64.  --Zoster (>60): At age 74  --COVID: 1st dose 04/17/19 and 2nd dose 05/08/19; booster 08/2019  .  -Prevention:  --Lipids /T/ A1c: WNL - checked 06/2019  --BP: Normotensive  .  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 Katrinka Blazing 2010--- never infected, but  packed with antibiotics with surgery;  Right rotator cuff repairs x 4, complicated by repeat tears,  infection, placement of anchor material  Elevated liver function tests  Diabetes  Seronegative RA  GI bleed  GERD  Pagets disease  Stage II  fibrosis of Liver  .  PSHx:  Right rotator cuff repair, with infected hardware that had to be  removed  2006  Repeat right shoulder surgery, also which became infected. 2007  Left rotator cuff repair 2008  bilateral knee replacements 2009  Left arthroscopic carpal tunnel release 01/2011  ORIF Left 4th metatarsal bone  Left Ulna shortening 1994  Knee replacement 2010  Hand surgery 2013, 2015  remove gallbladder/hernia 1990  Plate left foot 3rd metatarsal 2008  Left reverse TSA 03/07/16  Left Hamstring Repair 05/2019  .  [name]  Family History: (reviewed)   Father: DMII, died of MI at 43  Mom: died of lung CA at 12  MGM and PGM: lung ca (smokers)  MGF and several other second degree relatives: brain aneurysms in their 18'  s.  2 maternal uncles:melanoma (one died of unknown cancer, one living)  brother recently diagnosed with systemic scleroderma c/b gangrene of fingers  .  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  Separated from   partner of 21 years in June 2021  Non-smoker, rare etoh, no IVDU.  [name]  Review of Systems: Gen: No F/C   Eye: No diplopia, vision loss  Ears/Nose/Throat: No tinnitus or hearing loss  Cardiovascular: No CP, syncope  Respiratory: No cough, dyspnea  Gastrointestinal: No N/V/D/C, abd pain  Genitourinary: No dysuria, hematuria, urinary freq  Musculoskeletal: + back pain, jt pain, and swelling  Skin: No rash, susp lesions  Neurologic: No weakness, HA  Endocrine: No polydipsia/uria, wt change  Heme/Lymphatic: No abnormal bruising, bleeding, enlarged lymph nodes  All other systems unremarkable  .  .  .  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-Q21.1)  SCREENING EXAM FOR BREAST CANCER (ICD-V76.10) (ICD10-Z12.39)  ANEMIA, OTHER (ICD-285.8) (ICD10-D64.89)  LOWER EXTREMITY EDEMA (ICD-782.3) (ICD10-R60.0)  IRREGULAR HEART BEATS (ICD-427.9) (ICD10-I49.9)  GI BLEED (ICD-578.9) (ONG29-B28.2)  SCLERODERMA (ICD-710.1) (ICD10-M34.9)  SCREENING FOR CERVICAL CANCER (ICD-V76.2) (UXL24-M01.4)  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) (ICD10-M47.812)  NECK PAIN (ICD-723.1) (ICD10-M54.2)  STEROID USE, LONG TERM (ICD-V58.65) (ICD10-Z79.52)  PREMATURE VENTRICULAR CONTRACTIONS (ICD-427.69) (ICD10-I49.3)  HYPERLIPIDEMIA (ICD-272.4) (ICD10-E78.5)  BACK PAIN, LUMBAR, CHRONIC (ICD-724.2) (ICD10-M54.5)  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)  DIABETES MELLITUS, TYPE II (ICD-250.00) (ICD10-E11.9)  LONG-TERM (CURRENT) USE OF STEROIDS (ICD-V58.65) (ICD10-Z79.51)  SKIN SAGGING DUE TO WEIGHT LOSS (ICD-757.9) (ICD10-Q84.9)  TRANSAMINASES, SERUM, ELEVATED (ICD-790.4) (ICD10-R74.0)  OBESITY, BMI 30-34.9, ADULT (ICD-278.00) (ICD10-E66.9)      DIABETES MELLITUS, TYPE II, UNCONTROLLED (ICD-250.02) (ICD10-E11.65)      FATTY LIVER DISEASE (ICD-571.8) (ICD10-K76.0)  SCLERODERMA, 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)  HYPOTHYROIDISM (ICD-244.9) (ICD10-E03.9)  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) (ICD10-D  80.8)  SPINAL STENOSIS, 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)  HEPATITIS C EXPOSURE (HCV RNA NEGATIVE, 01/2014) (ICD-V02.62) (ICD10-Z20.5)  PAIN IN JOINT, HAND (ICD-719.44) (ICD10-M79.643)  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  [name]  MEDICATIONS:   PAST MEDICINES (prior to today's visit):  HYDROXYCHLOROQUINE SULFATE 200 MG ORAL TABLET (HYDROXYCHLOROQUINE SULFATE)   one tablet oral daily; Route: ORAL  JARDIANCE 10 MG ORAL TABLET (EMPAGLIFLOZIN) Take 1 tablet by mouth once dai  ly; Route: ORAL  ROSUVASTATIN 10MG  TABLETS (ROSUVASTATIN CALCIUM) TAKE 1 TABLET BY MOUTH EVE  RY DAY  LISINOPRIL 2.5MG  TABLETS (LISINOPRIL) TAKE 1 TABLET BY MOUTH ONCE DAILY  METFORMIN HCL ER 500 MG XR24H-TAB (METFORMIN HCL) TAKE 4 TABLETS BY MOUTH E  VERY MORNING  GLIPIZIDE ER 2.5 MG XR24H-TAB (GLIPIZIDE) TAKE 1 TABLET BY MOUTH DAILY  OMEPRAZOLE 20 MG ORAL CAPSULE DELAYED RELEASE (OMEPRAZOLE) Take one capsule   by mouth twice a day; Route: ORAL  BACLOFEN 10MG  TABLETS (BACLOFEN) TAKE 1/2 TABLET BY MOUTH EVERY DAY AS NEED  ED FOR BACK OR SPASMS  DICLOFENAC SODIUM 1 % TRANSDERMAL GEL (DICLOFENAC SODIUM) APP 2 GRAMS EXT A  A BID      FREESTYLE FREEDOM LITE w/Device KIT (BLOOD GLUCOSE MONITORING SUPPL) [place]  e as directed (ICD 10 E11.65)      FREESTYLE LITE BLOOD GLUCOSE STRIPS (GLUCOSE BLOOD) USE TO CHECK BLOOD   GLUCOSE THREE TIMES DAILY      FREESTYLE LANCETS (LANCETS) check fingerstick three times a day (ICD 10   E11.65)  TRAMADOL HCL 50 MG ORAL TABLET (TRAMADOL HCL) take one tablet daily as need  ed for pain; Route: ORAL  OXYCODONE HCL 5 MG ORAL TABLET (OXYCODONE HCL) Take one tablet daily as nee  ded for pain >8. Partial fill upon patient request.; Route: ORAL  CALCIUM CARBONATE-VITAMIN D 600-200 MG-UNIT ORAL TABLET (CALCIUM CARBONATE-  VITAMIN D) take one tablet twice a day; Route: ORAL  GABAPENTIN 300MG  CAPSULES (GABAPENTIN) TAKE 1 CAPSULE BY MOUTH EVERY NIGHT   AT BEDTIME AS NEEDED FOR PAIN  * BLOODWORK Please draw chemistry (sodium, potassium, magnesium, chloride,   bicarbonate, glucose,  creatinine, BUN), CBC, TSH, lipids, HbA1c. Fax result   to [phone] Dr. Marlane Hatcher  FAMOTIDINE 20 MG TABLET (FAMOTIDINE) TAKE 1 TABLET BY MOUTH AT NIGHT AS NEE  DED  LIDOCAINE 5 % PTCH (LIDOCAINE) APPLY ONE PATCH TO BACK ONCE DAILY AS NEEDED   FOR PAIN, REMOVE AFTER 12 HRS  MYCOPHENOLATE MOFETIL 500 MG ORAL TABLET (MYCOPHENOLATE MOFETIL) Take 2 tab  lets twice daily; Route: ORAL  GAMMAGARD 20 GM/200ML INJECTION SOLUTION (IMMUNE GLOBULIN (HUMAN)) Receives   infusion every 4 weeks, next dose 8/5; Route: INJECTION  URSODIOL 500 MG ORAL TABLET (URSODIOL) Take 1 tablet twice daily; Route: OR  AL  * WHEEL CHAIR WITH FOOT PEDALS USE AS INSTRUCTED HT 62.5 WT 158.6 M54.5  M4  8.06  * SHOWER CHAIR USE AS INSTRUCTED  HT 62.5 WT 158.9 E11.9 -- M54.5 -- M89.9  * RAISED TOILET SEAT   * RAISED TOILET SEAT USE AS INSTRUCTED HT 62.5 WT 158.6 E11.9 -- M89.9 -- M  54.5  TRAZODONE HCL 50 MG ORAL TABLET (TRAZODONE HCL) Take 1 tab 30 min before be  dtime for insomnia; Route: ORAL  * LOFT STAND CRUTCHES USE AS INSTRUCTED  HT 62.5 WT 158.6 M54.4 -- R60.0 --   M89.9  .  MEDICINE CHANGES:  Changed medication from BACLOFEN 10MG  TABLETS (BACLOFEN) TAKE 1/2 TABLET BY   MOUTH EVERY DAY AS NEEDED FOR  BACK OR SPASMS to BACLOFEN 10MG  TABLETS (BAC  LOFEN) TAKE 1 TABLET BY MOUTH TWICE DAILY AS NEEDED FOR BACK OR SPASMS - Si  gned  Removed medication of GLIPIZIDE ER 2.5 MG XR24H-TAB (GLIPIZIDE) TAKE 1 TABL  ET BY MOUTH DAILY - Signed  Removed medication of TRAMADOL HCL 50 MG ORAL TABLET (TRAMADOL HCL) take on  e tablet daily as needed for pain; Route: ORAL - Signed  Changed medication from GABAPENTIN 300MG  CAPSULES (GABAPENTIN) TAKE 1 CAPSU  LE BY MOUTH EVERY NIGHT AT BEDTIME AS NEEDED FOR PAIN to GABAPENTIN 300MG  C  APSULES (GABAPENTIN) TAKE 2 CAPSULE BY MOUTH EVERY NIGHT AT BEDTIME AS NEED  ED FOR PAIN - Signed  Changed medication from TRAZODONE HCL 50 MG ORAL TABLET (TRAZODONE HCL) Tak  e 1 tab 30 min before bedtime for insomnia; Route: ORAL to  TRAZODONE HCL 10  0 MG ORAL TABLET (TRAZODONE HCL) Take 1 tablet at bedtime; Route: ORAL - Si  gned  Rx of OXYCODONE HCL 5 MG ORAL TABLET (OXYCODONE HCL) Take one tablet daily   as needed for pain >8. Partial fill upon patient request.; Route: ORAL  #10  [Tablet] x 0;  Signed;  Entered by: [name] -PC 4B-;  Authorized b  y: [name] -PC 4B-;  Method used: [facility] [place]  [facility] [address], [place], [place]  , Ph: [phone], Fax:   [phone]; Note to Pharmacy: Route: ORAL;  Rx of TRAZODONE HCL 100 MG ORAL TABLET (TRAZODONE HCL) Take 1 tablet at bed  time; Route: ORAL  #30[Tablet] x 3;  Signed;  Entered by: [name]   -PC 4B-;  Authorized by: [name] -PC 4B-;  Method used: Electronic  ally [facility] [place] [facility] [address] [address] [place], [place]  ,   Ph: [phone], Fax: [phone]; Note to Pharmacy: Route: ORAL;  Medications Reviewed:  Done  .  [name]  ALLERGIES:   .  [name]  DIRECTIVES:   .  .  .  Vitals:   BP: 113 / 74 mmHg Ht: 62.5 in.  Wt: 156.6 lbs.  BMI (in-lb) 28.29  Temp: 97.9deg F.   Pulse Rate: 95 bpm O2 Sat: 97 %  .  [name]  Additional PE: General: chronically ill appearing woman, older than stated   age, lively and pleasant  HEENT: normocephalic, atraumatic, tympanic membranes pearly and grey bilate  rally, clear orophyrnx, No LAD  Heart: RRR, normal S1/S2, no m/r/g  Lungs: CTAB  Abdomen: soft, scaphoid appearing stomach, NTTP, BS+  Back: no tenderness to spinous processes  Extremities: WWP, hands notable for joint swelling; right IT band still ten  se but improved from prior.  Neuro: Strength 5/5 in bilateral upper and lower extremities; gait severely   affected requiring crutches  Skin: warm and dry, no moles or lesions  .  .  .  Assessment /T/ Plan:   #)Health Maintenance:  Vaccines: Up to date on all vaccines  -Flu shot: given flu shot today in clinic  Screening: Up to date on all screening  -Pap smear: Completed in 01/2019, due with  co-HPV testing in 2025  -CV/Lipids: within normal limits on rosuvastatin  -Cancer: Up to date on all screenings including mammogram and colonoscopy  -DM: under control, stopped glipizide per endocrinology due to well-control  led diabetes  -HTN: within goal on lisinopril  .  #)Scleroderma with Raynaud's  #)Scoliosis with chronic back pain  -  continue tylenol, baclofen, gabapentin - discussed increasing gabapentin   but she gets significant nausea; will trial 900 mg daily (good renal funcit  on), but will return to prior dose if symptoms return  - discuss opioids and long-acting opioids: plan to hold off on initiating a  nything until post-op  - RTC in January 2022 to discuss pain-control options  - discussed importance of protecting extremities with arrival of cold weath  er  .  .  [name]  * Resident Signature (.sign): ......................................Marland KitchenIvette Loyal MD -PC 4B-  February 03, 2020 5:29 PM  .  .  .  ORDERS:  Administration of Influenza Virus Vaccine [CPT-G0008]  RTC (Return to Clinic) [RTC-000]  Est Preventive 40-64yo [CPT-99396]  .  [name]  **Preceptor Note**  Resident: Tomma Lightning)  [facility]  .  [name]  Subjective   Patient is a 59 Years Old Female who presents to clinic for chronic pain, n  eeds neurosurgery soon  went up to 10 mg baclofen at night   also on gabepentin   trazodone up to 100 mg   I discussed the patient with Dr. Nicholes Stairs [place] Dec) in a routine precepting en  counter.  I also personally interviewed and examined the patient.  I agree   with the patient's diagnosis and management.   .  .  Objective   see resident's note for details.    .  Plan   #chronic pain--gabapentin   cannot take NSAIDs  take tylenol prn   diclofenac topical  #neurosurgery expected nov 23  #HCM  flu shot  covid vaccine  utd for pap, mammo,   #scleroderma--follows with [name], in [facility]   #insomnia==taking trazodone, dose increased  .  See resident note for details  .  Seen On Team (Floor):   4B  .  [name]  .  Patient Care Plan  .  .  .  Vaccines Administered/Entered:  Vaccination Group: Influenza  Series: 8  Vaccination: Fluarix Quadrivalent Intramuscular Suspension Prefilled Syring  e 0.5 ML  Mfr / Lot# / Exp.Date: GlaxoSmithKline / [id number] / 09/30/2020  Amt. Given / Route / Site: 0.5 mL / IM / Left Deltoid  NDC / CVX: [id number] / 150  Administered Date: 02/03/2020 15:00  VFC Eligibility: Non-VFC Clinic  VIS Date: 11/10/2013  Comments:   Administered by: Nicholes Stairs MD -PC 4B-, Hunt[place] Dec   .  Vaccination Record(s) Updated:  Vaccination Group: Influenza  Series: 4  Vaccination: Fluarix Quadrivalent Intramuscular Suspension Prefilled Syring  e 0.5 ML  Mfr / Lot# / Exp.Date: GlaxoSmithKline / [id number] / 09/30/2020  Amt. Given / Route / Site: 0.5 mL / IM / Left Deltoid  NDC / CVX: [id number] / 150  Administered Date: 02/03/2020 15:00  VFC Eligibility: Non-VFC Clinic  VIS Date: 11/10/2013  Comments:   Administered by: Nicholes Stairs MD -PC 4B-, [place] Dec   .  Immunization Worksheet 2019 (rev 07/09/2019)   .  .  .  .  .  .  .  .  .  .  .  .  .  Follow-up With:   .  .  [name]  Electronically Signed by Darrick Penna MD on 02/08/2020 at 10:55 AM  ________________________________________________________________________

## 2020-02-04 ENCOUNTER — Ambulatory Visit

## 2020-02-05 ENCOUNTER — Ambulatory Visit: Admitting: Student in an Organized Health Care Education/Training Program

## 2020-02-05 NOTE — Progress Notes (Signed)
* * *      [name], [name] **DOB:** Sep 30, 1960 (59 yo F) **Acc No.** [id number] **DOS:**  02/05/2020    ---        [name], Amreen [name]**    ------    86 Y old Female, DOB: [birth year]    [address], [place], [place] [address]    Home: [phone]    Provider: Doristine Mango        * * *    Telephone Encounter    ---    Answered by    Cherylin Mylar    Date: 02/05/2020        Time: 08:12 AM    Caller    Cailen    ------            Reason    11/8 appt            Message                      Hi [name],      [name] woke feeling so sick this morning, she wont make it in today. She is going in for back surgery on 11/23, she is trying to coordinate apts with Dr Shawnee Knapp and Dr Danelle Earthly before she goes in or later in December. Is there anyway she could come in before back surgery?                Action Taken                      Klein,[name]  02/05/2020 8:15:22 AM >      Bastien,[name]  02/05/2020 9:18:43 AM > Hi Dr. Shawnee Knapp, Please let me know if you are able to see this patient before you go in-service this month.  You have two 9:00 pts. for Tues., 11/9 w/a fellow.  Thx. [name]            Hi [name], not Tuesday since its full already. Can double book on Thursday AM that week.      Thanks      Trinna Post      [facility]  02/05/2020 10:36:00 AM >      Bastien,[name]  02/05/2020 11:50:59 AM >spoke w/pt.  Scheduled Thurs., 11/11 @ 9AM.  She is requesting to rescheduled Dr. Edgardo Roys appt. on 12/2.  Will send encounter to [facility].      Bastien,[name]  02/05/2020 11:53:12 AM > Hi Hessie Diener,  Patient has an appt. with Dr. Danelle Earthly on 12/2.  She is having back surgery and would like an appt. in November.  I have scheduled an appt. w/Dr. Shawnee Knapp for 11/11 @ 9AM.  She would like to know if Dr. Danelle Earthly could see her on the same day or if you can schedule another day in November.  ThxReece Achaia Garlock      [name],[name]  02/05/2020 12:11:52 PM > resch to 11/8 at 1120                    * * *                ---          * * *          Provider: Doristine Mango 02/05/2020    ---    Note generated by  eClinicalWorks EMR/PM Software ([email or web address])

## 2020-02-06 ENCOUNTER — Ambulatory Visit: Admitting: Physical Medicine & Rehabilitation

## 2020-02-06 NOTE — Progress Notes (Signed)
* * *      [name], [name] **DOB:** 02/15/1961 (59 yo F) **Acc No.** [id number] **DOS:**  02/06/2020    ---        [name], [name]**    ------    [name], DOB: 1960/09/04    [address], [place], [place] [address]    Home: [phone]    [name]        * * *    Telephone Encounter    ---    Answered by    Dondra Prader    Date: 02/06/2020        Time: 09:40 AM    Reason    med refill    ------            Message                      [name] called the office is requesting a refill on the medication you [facility]. HB#[phone]                Action Taken                      [facility]  02/06/2020 9:41:03 AM > sent to MD      [facility]  02/09/2020 8:27:07 AM > new rx written to fax to [facility]                    * * *                ---          * * *          [name], [name] 02/06/2020    ---    Note generated by eClinicalWorks EMR/PM Software ([email or web address])

## 2020-02-09 ENCOUNTER — Ambulatory Visit: Admitting: Hepatology

## 2020-02-09 ENCOUNTER — Ambulatory Visit: Admitting: Student in an Organized Health Care Education/Training Program

## 2020-02-09 ENCOUNTER — Ambulatory Visit

## 2020-02-09 ENCOUNTER — Ambulatory Visit: Admit: 2020-02-09

## 2020-02-09 LAB — HX CHEM-LFT
HX ALANINE AMINOTRANSFERASE (ALT/SGPT): 33 IU/L (ref 0–54)
HX ALKALINE PHOSPHATASE (ALK): 192 IU/L — ABNORMAL HIGH (ref 40–130)
HX ASPARTATE AMINOTRANFERASE (AST/SGOT): 42 IU/L (ref 10–42)
HX BILIRUBIN, TOTAL: 0.5 mg/dL (ref 0.2–1.1)
HX GAMMA GLUTAMYL TRANSFERASE (GGT): 301 IU/L — ABNORMAL HIGH (ref 0–59)

## 2020-02-09 LAB — HX CHEM-OTHER: HX ALBUMIN: 4.1 g/dL (ref 3.4–4.8)

## 2020-02-09 NOTE — Progress Notes (Signed)
[name], [name] **DOB:** 06-11-1960 (59 yo F) **Acc No.** [id number] **DOS:**  02/09/2020    ---        [name], [name]**    ------    [name], DOB: [birth year], External MRN: [id number]    Account Number: [id number]    [address], [place], [place]    Home: [phone]    Guarantor: [name] Insurance: [place] [employer]    PCP: [name] Frederick, MD Referring: Unknown Unknown    Appointment Facility[facility]        * * *    02/09/2020    **Appointment Provider:** Janee Morn, MD **CHN#:** [id number]    ------      **Supervising Provider:** Corwin Levins, MD    ---        **Reason for Appointment**    ---      1\. 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 She was last seen in the summer by hepatology and  started on ursodiol 1000mg . Since then, she is being planned for spinal  surgery on 11/23 due to severe spinal stenosis; she has been told it is a high  risk surgery with a high chance of mortality spinal surgery 11/23, high risk  surgery. neurosurg saying she has high chance of mortality and 30% of being  able to walk again. She is having a positive outlook. She is otherwise  asymptomatic from a GI standpoint: no confusion, jaundice, overt GI bleed,  change in bowel habits.    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    To review:    Previously seen in hep clinic in 2017 for elevated LFTs, but lost to  f/u  2017-2021    Elevated transaminases date back in 2001. Prior workup includes: AMA negative,  ASMA negative, HBV sAg non-reactive / e Ag non-reactive, c Ab non-reactive /  DNA not detected, HCV RNA not detected, Hepatitis A IgM negative,  ceruloplasmin and alpha-1 antitrypsin within normal limits, normal iron  indices and ferritin, TSH wnl, celiac serologies negative. GGT was elevated to  1417 in 2017 and recently to 1688 in 2021.    CT 08/2019 showed Diffuse low-density of the liver most likely represents  hepatic steatosis. No focal liver lesion.    On a side finding for the CT, there is left superior pubic ramus bone lesion  in which Pelvis MRI with intravenous contrast is recommended. 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 No further imaging follow-up is necessary.  Degenerative changes in the lower lumbar spine and both hips without change.    Reports a history of almost daily alcohol use in her 70s (  for about 8 years),  but has consumed rare EtOH for the past 20 years. Denies any other OTC  medications, herbal supplements other than vitamins and cranberry supplement.    _Ambulatory Falls and Injury Prevention_ :    HPI    Have you experienced a fall in the past year? _No_    Is the patient using assistive devices such as a cane or walker? _No_    Do you need assistance with ambulation while at our facility? _No_    Interventions _none, patient not a fall risk , ensured environment free from  obstacles and obstructions_      **Current Medications**    ---    Taking      * Acetaminophen 500 MG Tablet 2 tablets as needed Orally every 8 hrs, Notes: as needed     ---    * Baclofen 10 MG Tablet 1 tab Orally BID, Notes: prn     ---    * Calcium Carbonate-Vitamin D 600-200 MG-UNIT Capsule 1 capsule with a meal Orally Twice a day     ---    * Famotidine 40  MG Tablet 1 tablet at bedtime Orally Once a day, Notes: prn     ---    * Gabapentin 300 MG Capsule 1 capsule BID and 2 tablets prior to sleep Orally TID, Notes: only takes 1x prior to sleep due to side effects     ---    * Gammagard - Solution as directed Injection once every 4 weeks, Notes: every 4 weeks (infusion)     ---    * Glucophage XR 500 mg Tablet Extended Release 24 Hour 4 tablets Orally Once a day     ---    * Hydroxychloroquine Sulfate 200 MG Tablet TAKE 1 TABLET BY MOUTH EVERY DAY WITH FOOD OR MILK     ---    * Jardiance 10 MG Tablet take 1 tablet by mouth every day Orally Once a day     ---    * Lidocaine 5 % Patch 1 patch remove after 12 hours Externally PRN, Notes: prn     ---    * Lisinopril 2.5 MG Tablet 1 tablet Orally Once a day     ---    * Loftstrand crutches - 2 lofstrand crutches for ambulation daily     ---    * Mycophenolate Mofetil 500 MG Tablet 2 tablet Orally twice daily     ---    * Omeprazole 40 mg Capsule Delayed Release 1 capsule Orally twice daily     ---    * traMADol HCl 50 MG Tablet 1 tablet Orally every 6 hrs, Notes: PRN - rarely takes     ---    * Ursodiol 500 MG Tablet 1 tablet Orally Twice a day     ---    * Vitamin B Complex - Tablet 1 tablet Orally once daily     ---    * Vitamin D 25 MCG (1000 UT) Tablet 1 tablet Orally Once a day     ---    Not-Taking/PRN    * glipiZIDE XL 5 MG Tablet Extended Release 24 Hour 1 tablet Orally Once a day     ---    * MiraLax 17 GM Packet 1 packet mixed with 8 ounces of fluid Orally Once a day     ---    * oxyCODONE HCl 5 MG Tablet 1 tablet as needed Orally every 4-6 hrs, Notes: prn     ---    *  predniSONE 10 mg Tablet 1 tablet Orally Once a day     ---    * Rituxan 500 MG/50ML Solution as directed Intravenous , Notes: Last dose 05/19/19     ---      **Past Medical History**    ---      Scleroderma - CREST dx 2007.        ---    IgG4 deficiency s/p IVIG 2010 ----single infusion given preventively after  week of bilateral knee replacements  at [facility] 2010.        ---    Fx left wrist in 1994.        ---    Blood clot at LUE in 2006 on a short course of Coumadin.        ---    Bilateral carpal tunnel syndrome s/p Lt carpal tunnel release and steroids  injection right--.        ---    Bone spur left foot.        ---    Scoliosis.        ---    Spinal stenosis s/p steroids injection.        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 Katrinka Blazing 2010--- never infected, but packed with antibiotics with  surgery;.        ---    Right rotator cuff repairs x 4, complicated by repeat tears, infection,  placement of anchor material.        ---    Elevated liver function tests.        ---    Diabetes.        ---    Seronegative RA.        ---    GI bleed .        ---    GERD.        ---    Pagets disease.        ---    Stage II fibrosis of Liver.        ---      **Social History**    ---    Tobacco history: Never smoked.    Marital Status    _Single_    Work/Occupation: Out of work since 03/13/2019 due to current conditions; Works  for [employer] in Clinical biochemist.    Alcohol    _Former daily EtOH use in 20s_    Abuse/Neglect    Do you feel unsafe in your relationships? _No_    Have you ever been hit, kicked, punched or otherwise hurt by someone in the  past year? _No_    Plan _Resources Provided, Patient states they feel safe to return home_    Illicit drugs: Denies.      **Vital Signs**    ---    Pain scale **8** , Ht-in 62, Wt-lbs **156.4** , BMI  **28.60** , BP **113/68**  , HR **87** , BSA **1.76** , O2 **98%RA** , Ht-cm 157.48, Wt-kg **70.94** , Wt  Change 1.4 lb    pt. had lower back pain.      **Physical Examination**    ---    _GI Dr. Barbera Setters :    General:  Well appearing  ,  well developed  ,  well nourished  ,  in no acute  distress  .    Head:  Normocephalic  ,  atraumatic  .    Eyes:  Anicteric sclerae  ,  no conjunctival pallor  .    Heart:  Regular rate and rhythm  ,  normal S1, S2  ,  no  murmurs/rubs/gallops  .    Lungs:  Clear to auscultation bilaterally  .    Abdomen:  Flat, free of scars  ,  Normal bowel sounds  ,  Soft, nontender,  nondistended  ,  without hepatosplenomegaly or masses appreciated  ,  No  evident ascites  ,  No bruits or hernias noted  ,  No guarding or rebound  tenderness  .          **Assessments**    ---    1\. Abnormal liver function tests - R79.89 (Primary)    ---    2\. Abdominal pain - R10.9    ---    3\. Gastroparesis - K31.84    ---    4\. Abdominal bloating - R14.0    ---    5\. On corticosteroid therapy - Z79.52    ---    6\. 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 Her cholestatic liver enzyme abnormalities remain  elevated, but stable. She is planned to have a high-risk surgery later this  month for her spine with neurosurgery. We have made a risk assesment for  surgery; she has cholestatic liver enzyme abnormalitiies, but with good liver  function with normal bilirubin, INR, platelets. Her recent fibroscan showed F2  (moderate fibrosis), but no cirrhosis. We will follow up with her in several  months, after her surgery, to discuss liver biopsy as this would be the  recommendation to determine the cause of her liver abnormalities. Will repeat  liver tests today and before her next appointment. She can continue ursodiol  for now.    ---      **Treatment**    ---      **1\. Abnormal liver function tests**    _LAB: Alkaline Phosphatase (ALK)_     Value    Reference Range    ---------    Alkaline Phosphatase (ALK)    192    H    40 - 130 -  IU/L    _LAB: Bilirubin, Total (TBIL)_   Value    Reference Range    ---------    Bilirubin, Total    0.5      0.2 - 1.1 - mg/dL    _LAB: Aspartate aminotransferase (AST)_   Value    Reference Range    ---------    Aspartate Aminotranferase (AST/SGOT)    42      10 - 42 - IU/L    _LAB: Gamma Glutamyl Transferase (GGT)_   Value    Reference Range    ---------    Gamma Glutamyl Transferase (GGT)    301    H    0 - 59 - IU/L    _LAB: Alanine aminotransferase (ALT)_   Value    Reference Range    ---------    Alanine Aminotransferase (ALT/SGPT)    33      0 - 54 - IU/L  _LAB: Alkaline Phospatase Isoenzymes_    Notes: Get liver tests today, and in Jan before visit in Jan.      **Follow Up**    ---    04/27/19    **Appointment Provider:** Janee Morn, MD    Electronically signed by Corwin Levins , MD on 02/23/2020 at 10:41 AM EST    Sign off status: Completed        * * *        GI Clinic    [address] [place], [address]    [place], [place] [address]    Tel: [phone]    Fax: [phone]              * * *          Progress Note: Janee Morn, MD 02/09/2020    ---    Note generated by eClinicalWorks EMR/PM Software ([email or web address])

## 2020-02-09 NOTE — Progress Notes (Signed)
 .  Progress Notes  .  Patient: [name], [name] [facility]  Provider: Janee Morn    .  DOB: Sep 06, 1960 Age: 59 Y Sex: Female  Supervising Provider:: Corwin Levins, MD  Date: 02/09/2020  .  PCP: Ruffin Frederick  MD  Date: 02/09/2020  .  --------------------------------------------------------------------------------  .  REASON FOR APPOINTMENT  .  1. FU ABNORMAL LFTS  .  HISTORY OF PRESENT ILLNESS  .  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  She was last seen in the summer by hepatology and started on  ursodiol 1000mg . Since then, she is being planned for spinal  surgery on 11/23 due to severe spinal stenosis; she has been told  it is a high risk surgery with a high chance of mortality spinal  surgery 11/23, high risk surgery. neurosurg saying she has high  chance of mortality and 30% of being able to walk again. She is  having a positive outlook. She is otherwise asymptomatic from a  GI standpoint: no confusion, jaundice, overt GI bleed, change in  bowel habits.CT spineIMPRESSION: Severe spondylotic changes of  the lumbar spine, with severe multilevel spinal canal and neural  foraminal narrowing, as described above. To review:Previously  seen in hep clinic in 2017 for elevated LFTs, but lost to f/u  2017-2021Elevated transaminases date back in 2001. Prior workup  includes: AMA negative, ASMA negative, HBV sAg non-reactive / e  Ag non-reactive, c Ab non-reactive / DNA not detected, HCV RNA  not detected, Hepatitis A IgM negative, ceruloplasmin and alpha-1  antitrypsin within normal limits, normal iron indices and  ferritin, TSH wnl, celiac serologies  negative. GGT was elevated  to 1417 in 2017 and recently to 1688 in 2021. CT 08/2019 showed  Diffuse low-density of the liver most likely represents hepatic  steatosis. No focal liver lesion.On a side finding for the CT,  there is left superior pubic ramus bone lesion in which Pelvis  MRI with intravenous contrast is recommended. 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 No further  imaging follow-up is necessary. Degenerative changes in the lower  lumbar spine and both hips without change. Reports a history of  almost daily alcohol use in her 46s (for about 8 years), but has  consumed rare EtOH for the past 20 years. Denies any other OTC  medications, herbal supplements other than vitamins and cranberry  supplement.  .  Ambulatory Falls and Injury Prevention:  HPI  .  [name]  Have you experienced a fall in the past year?No  Is the patient using assistive devices such as a cane or  walker?No  Do you need assistance with ambulation while at our facility?No  Interventionsnone, patient not a fall risk , ensured environment  free from obstacles and obstructions  .  CURRENT MEDICATIONS  .  Taking Acetaminophen 500 MG Tablet 2 tablets as needed Orally  every 8 hrs, Notes: as needed  Taking Baclofen 10 MG Tablet 1 tab Orally BID, Notes:  prn  Taking Calcium Carbonate-Vitamin D 600-200 MG-UNIT Capsule 1  capsule with a meal Orally Twice a day  Taking Famotidine 40 MG Tablet 1 tablet at bedtime Orally Once a  day, Notes: prn  Taking Gabapentin 300 MG Capsule 1 capsule BID and 2 tablets  prior to sleep Orally TID, Notes: only takes 1x prior to sleep  due to side effects  Taking Gammagard - Solution as directed Injection once every 4  weeks, Notes: every 4 weeks (infusion)  Taking Glucophage XR 500 mg Tablet Extended Release 24 Hour 4  tablets Orally Once a day  Taking  Hydroxychloroquine Sulfate 200 MG Tablet TAKE 1 TABLET BY  MOUTH EVERY DAY WITH FOOD OR MILK  Taking Jardiance 10 MG Tablet take 1 tablet by mouth every day  Orally Once a day  Taking Lidocaine 5 % Patch 1 patch remove after 12 hours  Externally PRN, Notes: prn  Taking Lisinopril 2.5 MG Tablet 1 tablet Orally Once a day  Taking Loftstrand crutches - 2 lofstrand crutches for ambulation  daily  Taking Mycophenolate Mofetil 500 MG Tablet 2 tablet Orally twice  daily  Taking Omeprazole 40 mg Capsule Delayed Release 1 capsule Orally  twice daily  Taking traMADol HCl 50 MG Tablet 1 tablet Orally every 6 hrs,  Notes: PRN - rarely takes  Taking Ursodiol 500 MG Tablet 1 tablet Orally Twice a day  Taking Vitamin B Complex - Tablet 1 tablet Orally once daily  Taking Vitamin D 25 MCG (1000 UT) Tablet 1 tablet Orally Once a  day  Not-Taking/PRN glipiZIDE XL 5 MG Tablet Extended Release 24 Hour  1 tablet Orally Once a day  Not-Taking/PRN MiraLax 17 GM Packet 1 packet mixed with 8 ounces  of fluid Orally Once a day  Not-Taking/PRN oxyCODONE HCl 5 MG Tablet 1 tablet as needed  Orally every 4-6 hrs, Notes: prn  Not-Taking/PRN predniSONE 10 mg Tablet 1 tablet Orally Once a day  Not-Taking/PRN Rituxan 500 MG/50ML Solution as directed  Intravenous , Notes: Last dose 05/19/19  .  PAST MEDICAL HISTORY  .  Scleroderma - CREST dx 2007  IgG4 deficiency s/p IVIG 2010 ----single infusion given  preventively after week of bilateral knee replacements at [facility]  2010  Fx left wrist in 1994  Blood clot at LUE in 2006 on a short course of Coumadin  Bilateral carpal tunnel syndrome s/p Lt carpal tunnel release and  steroids injection right--  Bone spur left foot  Scoliosis  Spinal stenosis s/p steroids injection  s/p bilateral knee replacements for valgus /arthritic  complications, performed by Dr. Katrinka Blazing 2010--- never infected, but  packed with antibiotics with surgery;  Right rotator cuff repairs x 4, complicated by repeat tears,  infection, placement  of anchor material  Elevated liver function tests  Diabetes  Seronegative RA  GI bleed  GERD  Pagets disease  Stage II fibrosis of Liver  .  SOCIAL HISTORY  .  .  Tobacco  history: Never smoked.  .  .  Marital Status  Single  .  [name]  Work/Occupation: Out of work since 03/13/2019 due to current  conditions; Works for [employer] in Clinical biochemist.  .  .  Alcohol  Former daily EtOH use in 20s  .  [name]  Abuse/Neglect  Do you feel unsafe in your relationships?No  Have you ever been hit, kicked, punched or otherwise hurt by  someone in the past year? No  PlanResources Provided, Patient states they feel safe to return  home  .  .  Illicit drugs: Denies.  [name]  VITAL SIGNS  .  Pain scale 8, Ht-in 62, Wt-lbs 156.4, BMI 28.60, BP 113/68, HR  87, BSA 1.76, O2 98%RA, Ht-cm 157.48, Wt-kg 70.94, Wt Change 1.4  lbpt. had lower back pain.  [name]  PHYSICAL EXAMINATION  .  GI Dr. Barbera Setters:  General:  Well appearing, well developed, well nourished, in no  acute distress.  Head:  Normocephalic, atraumatic.  Eyes:  Anicteric sclerae, no conjunctival pallor .  Heart:  Regular rate and rhythm, normal S1, S2, no  murmurs/rubs/gallops.  Lungs:  Clear to auscultation bilaterally.  Abdomen:  Flat, free of scars, Normal bowel sounds, Soft,  nontender, nondistended, without hepatosplenomegaly or masses  appreciated, No evident ascites, No bruits or hernias noted, No  guarding or rebound tenderness.  .  ASSESSMENTS  .  Abnormal liver function tests - R79.89 (Primary)  .  Abdominal pain - R10.9  .  Gastroparesis - K31.84  .  Abdominal bloating - R14.0  .  On corticosteroid therapy - Z79.52  .  Elevated alkaline phosphatase level - R20.53  .  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 Her  cholestatic liver enzyme abnormalities remain elevated, but  stable. She is planned to have a high-risk surgery later this  month for her spine with neurosurgery. We have made a risk  assesment for surgery; she has cholestatic liver enzyme  abnormalitiies, but with good liver function with normal  bilirubin, INR, platelets. Her recent fibroscan showed F2  (moderate fibrosis), but no cirrhosis. We will follow up with her  in several months, after her surgery, to discuss liver biopsy as  this would be the recommendation to determine the cause of her  liver abnormalities. Will repeat liver tests today and before her  next appointment. She can continue ursodiol for now.  .  TREATMENT  .  Abnormal liver function tests  LAB: Alkaline Phosphatase (ALK)  Alkaline Phosphatase (ALK)     192     (40 - 130 - IU/L)  .  [name]  LAB: Bilirubin, Total (TBIL)  Bilirubin, Total     0.5     (0.2 - 1.1 - mg/dL)  .  [name]  LAB: Aspartate aminotransferase (AST)  Aspartate Aminotranferase (AST/SGOT)     42     (10 - 42 - IU/L)  .  [name]  LAB: Gamma Glutamyl Transferase (GGT)  Gamma Glutamyl Transferase (GGT)     301     (0 - 59 - IU/L)  .  [name]  LAB: Alanine aminotransferase (ALT)  Alanine Aminotransferase (ALT/SGPT)     33     (0 - 54 - IU/L)  .  [name]  LAB: Alkaline Phospatase Isoenzymes  .  Notes: Get liver tests today, and in Jan before visit in Jan.  .  FOLLOW UP  .  04/27/19  .  [name]  Appointment Provider: Janee Morn, MD  .  Electronically signed by Corwin Levins , MD on  02/23/2020 at 10:41 AM EST  .  CONFIRMATORY SIGN OFF  [name],BRENDAN 02/12/2020 10:24:20 AM > I personally interviewed  and examined the patient and both the fellow and I contributed to this electronic note. I agree with the history, exam, assessment and plan as detailed in this note and edited it as necessary. [facility] 02/23/2020 10:41:10 AM >   .  Document electronically signed by Janee Morn    .

## 2020-02-09 NOTE — Progress Notes (Signed)
* * *      PENNYE, [name] **DOB:** [birth year] (59 yo F) **Acc No.** [id number] **DOS:**  02/09/2020    ---        [name], [name]**    ------    [name], DOB: [birth year]    [address], [place], [place] [address]    Home: [phone]    Provider: Denton Meek        * * *    Telephone Encounter    ---    Answered by    Oletha Blend    Date: 02/09/2020        Time: 10:55 AM    Reason    Infusions and hand pain    ------            Message                      spoke with [facility] and tried to schedule her a follow up but she has surgery on 11/23 and cant dont 11/19 appointment because she has an infusion. She states that al of her joints especially her hands hurt. I let her know youre at [facility] and i amnot sure when you can get back to her.                Action Taken                      [facility]  02/09/2020 11:41:24 AM > We can schedule her appt after surgery.  [name] doesn't want to make big chnages around time of surgery.  We can try topical agents for her hands (but I'm sure she has tried these).  She needs her IVIG infusion on 11/19 (her surgery is 11/23).  This is per the surgeon and [name].  I have tried calling [facility] and no one has been able to help me.  I ahve asked her to call them and scheduler her at home infusion for 11/19?  If they need authorization from [place] I need to know who to talk to.  This is very important she get it that day before surgery to decrease risk of infection.      Sahana Boyland  02/09/2020 11:51:32 AM > LVMx1 for pt.  See other encounter with this info.                    * * *                ---          * * *          Provider: Denton Meek 02/09/2020    ---    Note generated by eClinicalWorks EMR/PM Software ([email or web address])

## 2020-02-12 ENCOUNTER — Ambulatory Visit

## 2020-02-12 ENCOUNTER — Ambulatory Visit: Admitting: Student in an Organized Health Care Education/Training Program

## 2020-02-12 ENCOUNTER — Ambulatory Visit: Admit: 2020-02-12

## 2020-02-12 NOTE — Progress Notes (Signed)
[name], [name] **DOB:** Oct 09, 1960 (59 yo F) **Acc No.** [id number] **DOS:**  02/12/2020    ---        [name], [name]**    ------    76 Y old Female, DOB: July 19, 1960, External MRN: [id number]    Account Number: [id number]    [address], [place], [place]    Home: [phone]    Guarantor: [name] Insurance: [id number] [employer] PPO    PCP: [name] Referring: [name] Penna, MD External Visit ID: [id number]    [facility]        * * *    02/12/2020    Progress Notes: [name] MangoCHN#:** [id number]    ------    ---        **Current Medications**    ---    Taking      * Acetaminophen 500 MG Tablet 2 tablets as needed Orally every 8 hrs, Notes: as needed     ---    * Baclofen 10 MG Tablet 1 tab Orally BID, Notes: prn     ---    * Calcium Carbonate-Vitamin D 600-200 MG-UNIT Capsule 1 capsule with a meal Orally Twice a day     ---    * Famotidine 40 MG Tablet 1 tablet at bedtime Orally Once a day, Notes: prn     ---    * Gabapentin 300 MG Capsule 1 capsule BID and 2 tablets prior to sleep Orally TID, Notes: only takes 1x prior to sleep due to side effects     ---    * Gammagard - Solution as directed Injection once every 4 weeks, Notes: every 4 weeks (infusion)     ---    * Glucophage XR 500 mg Tablet Extended Release 24 Hour 4 tablets Orally Once a day     ---    * Hydroxychloroquine Sulfate 200 MG Tablet TAKE 1 TABLET BY MOUTH EVERY DAY WITH FOOD OR MILK     ---    * Jardiance 10 MG Tablet take 1 tablet by mouth every day Orally Once a day     ---    * Lidocaine 5 % Patch 1 patch remove after 12 hours Externally PRN, Notes: prn     ---    * Lisinopril 2.5 MG Tablet 1 tablet Orally Once a day     ---    * Loftstrand crutches - 2 lofstrand crutches for ambulation daily     ---    * Mycophenolate Mofetil 500 MG Tablet 2 tablet Orally twice daily     ---    * Omeprazole 40 mg Capsule Delayed Release 1 capsule Orally twice daily     ---    * traMADol HCl 50 MG Tablet 1 tablet  Orally every 6 hrs, Notes: PRN - rarely takes     ---    * Ursodiol 500 MG Tablet 1 tablet Orally Twice a day     ---    * Vitamin B Complex - Tablet 1 tablet Orally once daily     ---    * Vitamin D 25 MCG (1000 UT) Tablet 1 tablet Orally Once a day     ---    Not-Taking/PRN    * glipiZIDE XL 5 MG Tablet Extended Release 24 Hour 1 tablet Orally Once a day     ---    * MiraLax 17 GM Packet 1 packet mixed with 8 ounces  of fluid Orally Once a day     ---    * oxyCODONE HCl 5 MG Tablet 1 tablet as needed Orally every 4-6 hrs, Notes: prn     ---    * predniSONE 10 mg Tablet 1 tablet Orally Once a day     ---    * Rituxan 500 MG/50ML Solution as directed Intravenous , Notes: Last dose 05/19/19     ---      Past Medical History    ---      Scleroderma - CREST dx 2007.        ---    IgG4 deficiency s/p IVIG 2010 ----single infusion given preventively after  week of bilateral knee replacements at [facility] 2010.        ---    Fx left wrist in 1994.        ---    Blood clot at LUE in 2006 on a short course of Coumadin.        ---    Bilateral carpal tunnel syndrome s/p Lt carpal tunnel release and steroids  injection right--.        ---    Bone spur left foot.        ---    Scoliosis.        ---    Spinal stenosis s/p steroids injection.        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 Katrinka Blazing 2010--- never infected, but packed with antibiotics with  surgery;.        ---    Right rotator cuff repairs x 4, complicated by repeat tears, infection,  placement of anchor material.        ---    Elevated liver function tests.        ---    Diabetes.        ---    Seronegative RA.        ---    GI bleed .        ---    GERD.        ---    Pagets disease.        ---    Stage II fibrosis of Liver.        ---    Gastroparesis.        ---      **Surgical History**    ---      Right rotator cuff repair, with infected hardware that had to be removed  2006    ---    Repeat right shoulder  surgery, also which became infected. 2007    ---    Left rotator cuff repair 2008    ---    bilateral knee replacements 2009    ---    Left arthroscopic carpal tunnel release 01/2011    ---    ORIF Left 4th metatarsal bone    ---    Left Ulna shortening 1994    ---    Knee replacement 2010    ---    Hand surgery 2013, 2015    ---    remove gallbladder/hernia 1990    ---    Plate left foot 3rd metatarsal 2008    ---    Left reverse TSA 03/07/16    ---    Left Hamstring Repair 05/2019    ---      **Family History**    ---  Mother: deceased 60 yrs, lung cancer, hyperthyroidism, diagnosed with Other  malignant neoplasm of unspecified site    ---    Father: deceased 48 yrs, heart attack, diagnosed with Unspecified heart  disease    ---    Siblings: alive, Brother - scleroderma, COPD    ---    3 brother(s) .    ---    Denies family history of GI cancer or liver disease.    ---      **Social History**    ---    Tobacco history: Never smoked.    Marital Status  Single.    Work/Occupation: Out of work since 03/13/2019 due to current conditions; Works  for [employer] in Clinical biochemist.    Alcohol  Former daily EtOH use in 20s.    Abuse/Neglect  Do you feel unsafe in your relationships? No  , Have you ever  been hit, kicked, punched or otherwise hurt by someone in the past year? No  ,  Plan Resources Provided, Patient states they feel safe to return home.    Illicit drugs: Denies.      **Hospitalization/Major Diagnostic Procedure**    ---      as above    ---    GI bleed 05/2019    ---      **Review of Systems**    ---    10 pt review notable for HPI.        **Reason for Appointment**    ---      1\. Follow up - GI bleed and gastroparesis    ---      **History of Present Illness**    ---    _Ambulatory Falls and Injury Prevention_ :    HPI Have you experienced a fall in the past year? No, Is the patient using  assistive devices such as a cane or walker? No, Do you need assistance with  ambulation while at our facility?  No, Interventions none, patient not a fall  risk , ensured environment free from obstacles and obstructions .    _GENERAL_ :    Mrs. Armetta is a woman with history of scleroderma (CREST), IgG4 deficiency,  RA, on chronic steroids with prior treatment with Rituxan, GI bleed in the  setting of diverticulosis, and recently diagnosed gastroparesis who presents  for follow up.    Patient was admitted in 05/2018 to local hospital after episodes of melena. She  was found to be anemic and underwent EGD and colonoscopy which showed a few  gastric erosions and diverticulosis but no active bleeding. No recurrence of  bleeding since. She is also followed in hepatology due to chronically elevated  LFTs.    At her intial visit she reported nausea with early satiety. Gastric emptying  study confirmed delayed gastric emptying. She was seen by our GI dietician  Consuella Lose and improved dramatically with dietary interventions. Episodes  decreased to maybe once a month which she is very happy about.    Recently started baclofen and gabapentin due to chronic pain which she is  schedule to have surgery for. Over the past week has been having increase  discomfort and nausea. Constipation is improved with laxative and fiber  supplement.    She remains Omeprazole 40mg  daily which she reports consistent use.      **Vital Signs**    ---    Pain scale **0** , Ht-in 62, BP **108/69** , HR **111** , O2 **98%RA** .      **Physical Examination**    ---  _GENERAL_ :    General Appearance:  W  ell developed, well hydrated, no acute distress  .  Mood/Affect:  Mood and affect within normal limits.  [name] HEENT  moist oral  mucosa, neck supple  . Neck  No carotid bruits  ,  supple  . Skin  Normal  , t  here is no rash or other skin lesions noted  . Cardiovascular  regular rate  and rhythm, normal S1 S2  ,  no murmurs,  extremities warm without swelling  .  Lungs  Clear to auscultation; no wheezes, rhonchi or rales  . Abdomen  Soft,  nontender,  nondistended, no organmegaly  ,  normoactive bowel sounds, no  peritoneal signs  . Extremities  warm and well perfused, healed scars on  bilateral knees  . Musculoskeletal  uncomfortable appearing gate, ambulating  with crutches  . Neuro  alert and oriented, exam grossly intact  . Digital  rectal exam  Deferred  .          **Assessments**    ---    1\. Upper GI bleeding - K92.2 (Primary)    ---    2\. Scleroderma - M34.9    ---    3\. Abnormal LFTs - R79.89    ---    4\. Diverticulosis - K57.90    ---    5\. Gastric erosion determined by endoscopy - K25.9    ---    6\. Nausea - R11.0    ---    7\. Gastroparesis - K31.84    ---      Mrs. Amy presents today for follow up recently diagnosed gastroparesis.  She was doing very well on dietary therapy but recently worsened after  starting baclofen and gabapentin which can further decreased gastric motility.  Advised adjusting diet per our dieticians recommendations to softer foods, and  if needed liquid diet during exacerbations until things stabilize. Hopefully  upcoming surgery will alleviate some of the discomfort for her. She will reach  out to Consuella Lose for any additional dietary questions.    Regarding the bleeding she has had no recurrence. Based on prior reports  suspect this was diverticular.    Patient is comfortable managing her gastroparesis so can follow up with me on  an as needed basis. She had follow up in hepatology for the liver disease.    ---      **Follow Up**    ---    prn    Electronically signed by [name] , MD on 02/12/2020 at 01:39 PM EST    Sign off status: Completed        * * *        [facility]    [address] [place], [address]    [place], [place] [address]    Tel: [phone]    Fax: [phone]              * * *          Progress Note: [name] 02/12/2020    ---    Note generated by eClinicalWorks EMR/PM Software ([email or web address])

## 2020-02-12 NOTE — Progress Notes (Signed)
 .  Progress Notes  .  Patient: [name], [name] [facility]  Provider: Doristine Mango    .  DOB:1961/01/28 Age: 59 Y Sex: Female  .  PCP: Darrick Penna    Date: 02/12/2020  .  --------------------------------------------------------------------------------  .  REASON FOR APPOINTMENT  .  1. Follow up - GI bleed and gastroparesis  .  HISTORY OF PRESENT ILLNESS  .  Ambulatory Falls and Injury Prevention:  HPI  .  Have you experienced a fall in the past year?No , Is the patient  using assistive devices such as a cane or walker?No , Do you need  assistance with ambulation while at our facility?No ,  Interventionsnone, patient not a fall risk , ensured environment  free from obstacles and obstructions  .  [name]  GENERAL:  Mrs. Mesina is a woman with history of  scleroderma (CREST), IgG4 deficiency, RA, on chronic steroids  with prior treatment with Rituxan, GI bleed in the setting of  diverticulosis, and recently diagnosed gastroparesis who presents  for follow up.Patient was admitted in 05/2018 to local hospital  after episodes of melena. She was found to be anemic and  underwent EGD and colonoscopy which showed a few gastric erosions  and diverticulosis but no active bleeding. No recurrence of  bleeding since. She is also followed in hepatology due to  chronically elevated LFTs. At her intial visit she reported  nausea with early satiety. Gastric emptying study confirmed  delayed gastric emptying. She was seen by our GI dietician Consuella Lose and improved dramatically with dietary interventions.  Episodes decreased to maybe once a month which she is very happy  about. Recently started baclofen and gabapentin due to chronic  pain which she is schedule to have surgery for. Over the past  week has been having increase discomfort and nausea. Constipation  is improved with laxative and fiber supplement.She remains  Omeprazole 40mg  daily which she reports consistent use.  [name]  CURRENT MEDICATIONS  .  Taking Acetaminophen 500 MG Tablet 2  tablets as needed Orally  every 8 hrs, Notes: as needed  Taking Baclofen 10 MG Tablet 1 tab Orally BID, Notes: prn  Taking Calcium Carbonate-Vitamin D 600-200 MG-UNIT Capsule 1  capsule with a meal Orally Twice a day  Taking Famotidine 40 MG Tablet 1 tablet at bedtime Orally Once a  day, Notes: prn  Taking Gabapentin 300 MG Capsule 1 capsule BID and 2 tablets  prior to sleep Orally TID, Notes: only takes 1x prior to sleep  due to side effects  Taking Gammagard - Solution as directed Injection once every 4  weeks, Notes: every 4 weeks (infusion)  Taking Glucophage XR 500 mg Tablet Extended Release 24 Hour 4  tablets Orally Once a day  Taking Hydroxychloroquine Sulfate 200 MG Tablet TAKE 1 TABLET BY  MOUTH EVERY DAY WITH FOOD OR MILK  Taking Jardiance 10 MG Tablet take 1 tablet by mouth every day  Orally Once a day  Taking Lidocaine 5 % Patch 1 patch remove after 12 hours  Externally PRN, Notes: prn  Taking Lisinopril 2.5 MG Tablet 1 tablet Orally Once a day  Taking Loftstrand crutches - 2 lofstrand crutches for ambulation  daily  Taking Mycophenolate Mofetil 500 MG Tablet 2 tablet Orally twice  daily  Taking Omeprazole 40 mg Capsule Delayed Release 1 capsule Orally  twice daily  Taking traMADol HCl 50 MG Tablet 1 tablet Orally every 6 hrs,  Notes: PRN - rarely takes  Taking Ursodiol 500 MG  Tablet 1 tablet Orally Twice a day  Taking Vitamin B Complex - Tablet 1 tablet Orally once daily  Taking Vitamin D 25 MCG (1000 UT) Tablet 1 tablet Orally Once a  day  Not-Taking/PRN glipiZIDE XL 5 MG Tablet Extended Release 24 Hour  1 tablet Orally Once a day  Not-Taking/PRN MiraLax 17 GM Packet 1 packet mixed with 8 ounces  of fluid Orally Once a day  Not-Taking/PRN oxyCODONE HCl 5 MG Tablet 1 tablet as needed  Orally every 4-6 hrs, Notes: prn  Not-Taking/PRN predniSONE 10 mg Tablet 1 tablet Orally Once a day  Not-Taking/PRN Rituxan 500 MG/50ML Solution as directed  Intravenous , Notes: Last dose 05/19/19  .  PAST MEDICAL  HISTORY  .  Scleroderma - CREST dx 2007  IgG4 deficiency s/p IVIG 2010 ----single infusion given  preventively after week of bilateral knee replacements at [name]  2010  Fx left wrist in 1994  Blood clot at LUE in 2006 on a short course of Coumadin  Bilateral carpal tunnel syndrome s/p Lt carpal tunnel release and  steroids injection right--  Bone spur left foot  Scoliosis  Spinal stenosis s/p steroids injection  s/p bilateral knee replacements for valgus /arthritic  complications, performed by Dr. Katrinka Blazing 2010--- never infected, but  packed with antibiotics with surgery;  Right rotator cuff repairs x 4, complicated by repeat tears,  infection, placement of anchor material  Elevated liver function tests  Diabetes  Seronegative RA  GI bleed  GERD  Pagets disease  Stage II fibrosis of Liver  Gastroparesis  .  ALLERGIES  .  yes[Allergies Verified]  .  SURGICAL HISTORY  .  Right rotator cuff repair, with infected hardware that had to be  removed 2006  Repeat right shoulder surgery, also which became infected. 2007  Left rotator cuff repair 2008  bilateral knee replacements 2009  Left arthroscopic carpal tunnel release 01/2011  ORIF Left 4th metatarsal bone  Left Ulna shortening 1994  Knee replacement 2010  Hand surgery 2013, 2015  remove gallbladder/hernia 1990  Plate left foot 3rd metatarsal 2008  Left reverse TSA 03/07/16  Left Hamstring Repair 05/2019  .  FAMILY HISTORY  .  Mother: deceased 5 yrs, lung cancer, hyperthyroidism, diagnosed  with Other malignant neoplasm of unspecified site  Father: deceased 30 yrs, heart attack, diagnosed with Unspecified  heart disease  Siblings: alive, Brother - scleroderma, COPD  3 brother(s) .  Denies family history of GI cancer or liver disease.  .  SOCIAL HISTORY  .  .  Tobacco  history: Never smoked.  .  Marital Status Single.  .  Work/Occupation: Out of work since 03/13/2019 due to current  conditions; Works for [employer] in Clinical biochemist.  .  Alcohol Former daily EtOH use in  20s.  .  Abuse/NeglectDo you feel unsafe in your relationships?No , Have  you ever been hit, kicked, punched or otherwise hurt by someone  in the past year? No , PlanResources Provided, Patient states  they feel safe to return home.  .  Illicit drugs: Denies.  [name]  HOSPITALIZATION/MAJOR DIAGNOSTIC PROCEDURE  .  as above  GI bleed 05/2019  .  REVIEW OF SYSTEMS  .  10 pt review notable for HPI.  [name]  VITAL SIGNS  .  Pain scale 0, Ht-in 62, BP 108/69, HR 111, O2 98%RA.  [name]  PHYSICAL EXAMINATION  .  GENERAL:  General Appearance:  Well developed, well hydrated, no acute  distress. Mood/Affect:  Mood and affect  within normal limits.[name]  HEENT  moist oral mucosa, neck supple. Neck  No carotid bruits,  supple. Skin  Normal, there is no rash or other skin lesions  noted. Cardiovascular  regular rate and rhythm, normal S1 S2, no  murmurs, extremities warm without swelling. Lungs  Clear to  auscultation; no wheezes, rhonchi or rales. Abdomen  Soft,  nontender, nondistended, no organmegaly, normoactive bowel  sounds, no peritoneal signs. Extremities  warm and well perfused,  healed scars on bilateral knees. Musculoskeletal  uncomfortable  appearing gate, ambulating with crutches. Neuro  alert and  oriented, exam grossly intact. Digital rectal exam  Deferred.  .  ASSESSMENTS  .  Upper GI bleeding - K92.2 (Primary)  .  Scleroderma - M34.9  .  Abnormal LFTs - R79.89  .  Diverticulosis - K57.90  .  Gastric erosion determined by endoscopy - K25.9  .  Nausea - R11.0  .  Gastroparesis - K31.84  .  Mrs. Salak presents today for follow up recently diagnosed  gastroparesis. She was doing very well on dietary therapy but  recently worsened after starting baclofen and gabapentin which  can further decreased gastric motility. Advised adjusting diet  per our dieticians recommendations to softer foods, and if needed  liquid diet during exacerbations until things stabilize.  Hopefully upcoming surgery will alleviate some of the discomfort  for her.  She will reach out to Consuella Lose for any additional  dietary questions.Regarding the bleeding she has had no  recurrence. Based on prior reports suspect this was diverticular.  Patient is comfortable managing her gastroparesis so can follow  up with me on an as needed basis. She had follow up in hepatology  for the liver disease.  .  FOLLOW UP  .  prn  .  Electronically signed by Doristine Mango , MD on  02/12/2020 at 01:39 PM EST  .  Document electronically signed by Doristine Mango    .

## 2020-02-16 ENCOUNTER — Ambulatory Visit: Admitting: Hepatology

## 2020-02-16 ENCOUNTER — Ambulatory Visit: Admit: 2020-02-16

## 2020-02-17 ENCOUNTER — Ambulatory Visit: Admitting: Surgery

## 2020-02-17 ENCOUNTER — Ambulatory Visit

## 2020-02-17 ENCOUNTER — Ambulatory Visit: Admitting: Neurological Surgery

## 2020-02-17 ENCOUNTER — Ambulatory Visit: Admitting: Internal Medicine

## 2020-02-17 ENCOUNTER — Ambulatory Visit: Admit: 2020-02-17

## 2020-02-17 LAB — HX HEM-ROUTINE
HX HCT: 43.2 % (ref 32.0–45.0)
HX HGB: 13.9 g/dL (ref 11.0–15.0)
HX MCH: 30.3 pg (ref 26.0–34.0)
HX MCHC: 32.2 g/dL (ref 32.0–36.0)
HX MCV: 94.3 fL (ref 80.0–98.0)
HX MPV: 10.2 fL (ref 9.1–11.7)
HX NRBC #: 0 10*3/uL
HX NUCLEATED RBC: 0 %
HX PLT: 294 10*3/uL (ref 150–400)
HX RBC BLOOD COUNT: 4.58 M/uL (ref 3.70–5.00)
HX RDW: 13.2 % (ref 11.5–14.5)
HX WBC: 6.5 10*3/uL (ref 4.0–11.0)

## 2020-02-17 LAB — HX CHEM-PANELS
HX ANION GAP: 11 (ref 3–14)
HX BLOOD UREA NITROGEN: 18 mg/dL (ref 6–24)
HX CHLORIDE (CL): 102 meq/L (ref 98–110)
HX CO2: 22 meq/L (ref 20–30)
HX CREATININE (CR): 0.67 mg/dL (ref 0.57–1.30)
HX GFR, AFRICAN AMERICAN: 113 mL/min/{1.73_m2}
HX GFR, NON-AFRICAN AMERICAN: 98 mL/min/{1.73_m2}
HX GLUCOSE: 99 mg/dL (ref 70–139)
HX POTASSIUM (K): 4.3 meq/L (ref 3.6–5.1)
HX SODIUM (NA): 135 meq/L (ref 135–145)

## 2020-02-17 LAB — HX IMMUNOLOGY: HX C-REACTIVE PROTEIN - CRP: 0.82 mg/L (ref 0.00–7.48)

## 2020-02-17 LAB — HX COAGULATION
HX INR PT: 0.9 (ref 0.9–1.3)
HX PROTHROMBIN TIME: 10.9 s (ref 9.7–14.0)
HX PTT: 32.8 s (ref 25.7–35.7)

## 2020-02-17 LAB — HX HEM-MISC: HX SED RATE: 33 mm — ABNORMAL HIGH (ref 0–30)

## 2020-02-17 LAB — HX DIABETES: HX GLUCOSE: 99 mg/dL (ref 70–139)

## 2020-02-17 MED ORDER — SULFAMETHOXAZOLE-TRIMETHOPRIM: 1 | 14 | 0 refills | 0 days | Status: AC

## 2020-02-17 NOTE — Progress Notes (Signed)
[name], [name] **DOB:** 1961-03-05 (59 yo F) **Acc No.** [id number] **DOS:**  02/17/2020    ---        [name], [name]**    ------    25 Y old Female, DOB: 07-21-1960, External MRN: [id number]    Account Number: [id number]    [address], [place], [place]    Home: [phone]    Guarantor: [name] Insurance: [id number] NH[employer] PPO    PCP: Ruffin Frederick, MD Referring: Darrick Penna, MD External Visit ID:  [id number]    Appointment Facility: Neurosurgery        * * *    02/17/2020    Progress Notes: Corinna Capra, MD **CHN#:** [phone]    ------    ---        **Current Medications**    ---    Taking      * Acetaminophen 500 MG Tablet 2 tablets as needed Orally every 8 hrs, Notes: as needed     ---    * Baclofen 10 MG Tablet 1 tab Orally BID     ---    * Calcium Carbonate-Vitamin D 600-200 MG-UNIT Capsule 1 capsule with a meal Orally Twice a day     ---    * Clindamycin HCl 300 MG Capsule 1 capsule Orally every 8 hrs, stop date 02/21/2020, Notes: for 7 days     ---    * Famotidine 40 MG Tablet 1 tablet at bedtime Orally Once a day, Notes: prn     ---    * Gabapentin 300 MG Capsule 1 capsule Orally three times a day     ---    * Gammagard - Solution as directed Injection once every 4 weeks, Notes: every 4 weeks (infusion); ;next dose 02/20/2020 and will have infusion postop while in hospital per [name]     ---    * Glucophage XR 500 mg Tablet Extended Release 24 Hour 4 tablets Orally Once a day     ---    * Hydroxychloroquine Sulfate 200 MG Tablet TAKE 1 TABLET BY MOUTH EVERY DAY WITH FOOD OR MILK     ---    * Jardiance 10 MG Tablet take 1 tablet by mouth every day Orally Once a day     ---    * Lidocaine 5 % Patch 1 patch remove after 12 hours Externally PRN, Notes: prn     ---    * Lisinopril 2.5 MG Tablet 1 tablet Orally Once a day     ---    * Loftstrand crutches - 2 lofstrand crutches for ambulation daily     ---    * Mycophenolate Mofetil 500 MG Tablet 2 tablet Orally twice daily     ---     * Omeprazole 40 mg Capsule Delayed Release 1 capsule Orally twice daily     ---    * oxyCODONE HCl 5 MG Tablet 1 tablet as needed Orally every 4-6 hrs, Notes: prn     ---    * Ursodiol 500 MG Tablet 1 tablet Orally Twice a day     ---    * Vitamin B Complex - Tablet 1 tablet Orally once daily     ---    * Vitamin D 25 MCG (1000 UT) Tablet 1 tablet Orally Once a day     ---    Medication List reviewed and reconciled with the patient    ---  Past Medical History    ---      Scleroderma - CREST dx 2007.        ---    IgG4 deficiency s/p IVIG 2010 ----single infusion given preventively after  week of bilateral knee replacements at [facility] 2010.        ---    Fx left wrist in 1994.        ---    Blood clot at LUE in 2006 on a short course of Coumadin.        ---    Bilateral carpal tunnel syndrome s/p Lt carpal tunnel release and steroids  injection right--.        ---    Bone spur left foot.        ---    Scoliosis.        ---    Spinal stenosis s/p steroids injection.        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 Katrinka Blazing 2010--- never infected, but packed with antibiotics with  surgery;.        ---    Right rotator cuff repairs x 4, complicated by repeat tears, infection,  placement of anchor material.        ---    Elevated liver function tests.        ---    Diabetes.        ---    Seronegative RA.        ---    GI bleed .        ---    GERD.        ---    Pagets disease.        ---    Stage II fibrosis of Liver.        ---    Gastroparesis.        ---    COVID-vaccine 05/20/2019, 06/17/2019, booster 12/09/2019 [name]).        ---      **Surgical History**    ---      Right rotator cuff repair, with infected hardware that had to be removed  2006    ---    Repeat right shoulder surgery, also which became infected. 2007    ---    Left rotator cuff repair 2008    ---    bilateral knee replacements 2009    ---    Left arthroscopic carpal tunnel release 01/2011     ---    ORIF Left 4th metatarsal bone    ---    Left Ulna shortening 1994    ---    Knee replacement 2010    ---    Hand surgery 2013, 2015    ---    remove gallbladder/hernia 1990    ---    Plate left foot 3rd metatarsal 2008    ---    Left reverse TSA 03/07/16    ---    Left Hamstring Repair 05/2019    ---      **Social History**    ---    Tobacco history: Never smoked.    Marital Status  Single.    Work/Occupation: Out of work since 03/13/2019 due to current conditions; Works  for [employer] in Clinical biochemist.    Alcohol  Former daily EtOH use in 20s.    Abuse/Neglect  Do you feel unsafe in your relationships? No  , Have you ever  been hit,  kicked, punched or otherwise hurt by someone in the past year? No  ,  Plan Resources Provided, Patient states they feel safe to return home.    Illicit drugs: Denies.      **Allergies**    ---      N.K.D.A.    ---    [name]]      **Hospitalization/Major Diagnostic Procedure**    ---      as above    ---    GI bleed 05/2019    ---        **Reason for Appointment**    ---      1\. SIGN CONSENT    ---    2\. Low back pain and bilateral leg pain    ---      **History of Present Illness**    ---    _Ambulatory Falls and Injury Prevention_ :    HPI Have you experienced a fall in the past year? No falls in the past year,  Is the patient using assistive devices such as a cane or walker? No, Do you  need assistance with ambulation while at our facility? No.    _Associated Providers_ :    c/o Primary Care Provider  Dr. Darrick Penna ([place])  .    _NEUROSURGERY_ :    59 year old female presents in follow-up (last appointment 01/26/2020) with  lower back pain and right radiculopathy.    12/29/2019: She had an internal GI bleed in Feb. 2021. She initially thought  she had a stomach bug. After 3 days of inability to eat/drink, she presented  to the hospital and was told that she had an internal bleed. She had IVF and  antibiotics. She was seen by a gastro here at [facility] who  per the patient did  not see any reason for further testing. She has continued to have issues since  this episode. She will have an upset stomach every 8-14 days (emesis and  diarrhea). She has been working with a nutritionist here at [facility] also.    A lesion was found on her liver during her hospitalization. 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 [name], her rheumatologist, believed that a  lesion to her right pelvis might be the cause of her symptoms. She had scans  completed.    Since this episode, she started to notice weakness to her lower extremities.    She ruptured her right hamstring in Dec. 2020. She had reconstructed surgery  in Jan. 2021. She started to have pain down her right leg. She saw a PT and a  massage therapist. She was doing therapy on her own also. The right leg  weakness progressed. She had a lumbar injection in Aug. 2021 at [facility] clinic.    Today she reports pain that radiates from her right lower back into right  lateral thigh to the ankle. She presents today with crutches. She cannot walk  more than 20 yards without crutches. She is unable to support her self. She  has needed crutches for 2 months. The symptoms are improved with forward  flexion at the waist.    She has been told in the past that she has scoliosis and spinal stenosis. This  was diagnosed in 2011/2012. She has not had lumbar spine surgery. She used to  use an inversion table but since she moved she has not been able to use it.    01/26/2020: She had new thoracic and lumbar CT scans  completed.    Today she reports that her lower back pain is significant and radiates into  her left hip. The pain will radiate up to her mid-back also. She continues to  have pain that radiates from her right hip to her ankle. She reports the pain  is so severe today that she is nauseous.    She notes that her mobility has decreased over the last two weeks and an  increase in her pain. She has been having difficulty sleeping due to the  pain.  She previous took oxycodone but now only takes baclofen for her symptoms.    She is currently in PT; 2x/week. She was prescribed lofstrand crutches.      **Vital Signs**    ---    Pain scale **8** , Ht-in 62, Wt-lbs **156** , BMI  **28.53** , BP **90/55** ,  RR **16** , Pulse sitting **88**    T 98.4 orally 02/17/2020.      **Physical Examination**    ---    _GENERAL_ :    Cardiovascular  heart regular rate and rhythm  . Lungs  Clear to auscultation  bilaterally  . Abdomen  bowel sounds present  .    _NEUROSURGERY_ :    MOTOR Lower Extremities  :  5/5 strength in the bilateral lower extremities  .  Reflexes  Left Knee Jerk  Absent  ,  Right Knee Jerk  Absent  ,  Left Ankle  Jerk  Absent no left achilles  ,  Right Ankle Jerk  2+  . Gait  :  Antalgic  uses crutches  .    _DIAGNOSTIC STUDIES_ :    RADIOLOGY  I reviewed lumbar ap/lateral/flexion/extension xrays completed  12/29/2019 at [facility] I reviewed thoracic and lumbar spine CTs completed  01/23/2020 at [facility].  [name] MRI  Uploaded to PACS; I reviewed lumbar spine MRI  completed 12/16/2019 at [facility]  .          **Assessments**    ---    1\. 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 Acquired spondylolisthesis - M43.19    ---      She had continued debilitating pain and cannot walk independently at all.  She has lofstrand crutches and she has a WC that she used when she had her  knee replacements and thinks she likely needs to get it out of storage and use  it again. When she tries to walk independently in the office with assist, she  can't do it, and with my assistance she can stand for a very short period of  time. The CT is very helpful as it shows an autofusion of L5-S1 and shows  severe degenerative changes worst at the L4-5 level. Reviewing the xrays, MRI,  CT scan again, and reviewing all her medical conditions and comorbidities, I  think the operation that makes the most sense in terms of "risk/benefit"  ratio  for her is L4-5 TLIF surgery to address the worst area with severe stenosis  and spondylolisthesis with likely diminishing returns coming from operating on  other levels (i.e. adding other levels likely increases risk without  increasing benefit enough)    While I have only known her for a short time, in talking to her, reviewing her  prior notes and being in touch with her other providers and their notes, I  think this surgery realistically may help some of her pain but isn't a "cure"  and all the problems she has collectively are likely going to end up with her  needing a WC or scooter on the near horizon, which I told her.    I discussed the option of L4-5 TLIF with her in detail and she would like to  proceed. It is extremely high risk- she is at higher risks for complication  because of all the comorbidities and she understands this. The patient lives  alone; she prefers to go to rehab after surgery.    She has a gammagard IVIG injection this Friday (she gets the injections  monthly). This is administered by [name].    Most recent A1C is 6.1 (01/13/2020). I discussed risks, benefits and treatment  alternatives with her and she would like to proceed with surgery. She signed  the surgical consent form today.    She has new cellulitic area on the left lower leg which was drained at [facility] on 02/12/2020 in [place], culture was sent and she was started on  bactrim. She returned to Urgent care on 02/14/2020 with new are which was  "lanced" with a needle and another culture was sent and she was changed to  Clindamycin. Will send her to Dr. Dorene Grebe today , her PCP to be evaluated. Will  discuss with Dr. Dionisio David. Will get records/culture report from [facility] in [place]. [phone].    ---      **Procedure Codes**    ---      1093/05/19 OP VISIT EP LEVEL 3 99213 TECH    ---      **Follow Up**    ---    surgery 02/24/2020    Electronically signed by Secundino Ginger , ANP, BC on 02/17/2020 at  01:57 PM  EST    Sign off status: Completed        * * *        Neurosurgery    [address] [place], [address]    [place], [place] [address]    Tel: [phone]    Fax: [phone]              * * *          Progress Note: Corinna Capra, MD 02/17/2020    ---    Note generated by eClinicalWorks EMR/PM Software ([email or web address])

## 2020-02-17 NOTE — Progress Notes (Signed)
ADRINNE, [name] [name] **DOB:** 1960/06/13 (59 yo F) **Acc No.** [id number] **DOS:**  02/17/2020    ---      ** **    ---    **Patient:** [name] [name], [name] [name]    **Account Number:** [id number]  **External MRN:** [id number]    **Provider:**  Appointment Resource    **DOB:** 1961/02/14  **Age:** 59 Y  **Sex:** Female    **Date:** 02/17/2020    **Phone:** [phone]    **Address:** [address], [place], [place]    **Pcp:** Ruffin Frederick, MD        * * *        **Subjective:**        ---      **Chief Complaints:**    ------      1\. M43.19 DOS 02/24/20 NEED COVID.    ------      **Medical History:**        ------      **Medications:** Taking Acetaminophen 500 MG Tablet 2 tablets as needed  Orally every 8 hrs, Notes: as needed, Taking Baclofen 10 MG Tablet 1 tab  Orally BID, Taking Calcium Carbonate-Vitamin D 600-200 MG-UNIT Capsule 1  capsule with a meal Orally Twice a day, Taking Clindamycin HCl 300 MG Capsule  1 capsule Orally every 8 hrs, stop date 02/21/2020, Notes: for 7 days, Taking  Famotidine 40 MG Tablet 1 tablet at bedtime Orally Once a day, Notes: prn,  Taking Gabapentin 300 MG Capsule 1 capsule Orally three times a day, Notes:  only takes 1x prior to sleep due to side effects, Taking Gammagard - Solution  as directed Injection once every 4 weeks, Notes: every 4 weeks (infusion);  ;next dose 02/20/2020 and will have infusion postop while in hospital per Dr.  [name], Taking Glucophage XR 500 mg Tablet Extended Release 24 Hour 4 tablets  Orally Once a day, Taking Hydroxychloroquine Sulfate 200 MG Tablet TAKE 1  TABLET BY MOUTH EVERY DAY WITH FOOD OR MILK , Taking Jardiance 10 MG Tablet  take 1 tablet by mouth every day Orally Once a day, Taking Lidocaine 5 % Patch  1 patch remove after 12 hours Externally PRN, Notes: prn, Taking Lisinopril  2.5 MG Tablet 1 tablet Orally Once a day, Taking Loftstrand crutches - 2  lofstrand crutches for ambulation daily, Taking Mycophenolate Mofetil 500 MG  Tablet 2 tablet Orally twice  daily, Taking Omeprazole 40 mg Capsule Delayed  Release 1 capsule Orally twice daily, Taking oxyCODONE HCl 5 MG Tablet 1  tablet as needed Orally every 4-6 hrs, Notes: prn, Taking traMADol HCl 50 MG  Tablet 1 tablet Orally every 6 hrs, Notes: PRN - rarely takes, Taking Ursodiol  500 MG Tablet 1 tablet Orally Twice a day, Taking Vitamin B Complex - Tablet 1  tablet Orally once daily, Taking Vitamin D 25 MCG (1000 UT) Tablet 1 tablet  Orally Once a day, Not-Taking/PRN glipiZIDE XL 5 MG Tablet Extended Release 24  Hour 1 tablet Orally Once a day, Not-Taking/PRN MiraLax 17 GM Packet 1 packet  mixed with 8 ounces of fluid Orally Once a day, Not-Taking/PRN predniSONE 10  mg Tablet 1 tablet Orally Once a day, Not-Taking/PRN Rituxan 500 MG/50ML  Solution as directed Intravenous , Notes: Last dose 05/19/19    ------        **Objective:**        ---        **Assessment:**        ---        **Plan:**        ---        ------    ---    ---                ---  Electronically signed by Oralia Manis on 02/17/2020 at 04:04 PM EST    Sign off status: Completed          * * *      **Provider:** Appointment Resource    **Date:** 02/17/2020    ------

## 2020-02-17 NOTE — Progress Notes (Signed)
 .  Progress Notes  .  Patient: [name], [name] [facility]  Provider: Corinna Capra I   .  DOB: 01/22/1961 Age: 59 Y Sex: Female  .  PCP: Ruffin Frederick  MD  Date: 02/17/2020  .  --------------------------------------------------------------------------------  .  REASON FOR APPOINTMENT  .  1. SIGN CONSENT  .  2. Low back pain and bilateral leg pain  .  HISTORY OF PRESENT ILLNESS  .  Ambulatory Falls and Injury Prevention:  HPI  .  Have you experienced a fall in the past year?No falls in the past  year , Is the patient using assistive devices such as a cane or  walker?No , Do you need assistance with ambulation while at our  facility?No  .  .  Associated Providers:  c/o Primary Care Provider  Dr. Darrick Penna [facility]).  .  .  NEUROSURGERY:  59 year old female presents in  follow-up (last appointment 01/26/2020) with lower back pain and  right radiculopathy. 12/29/2019: She had an internal GI bleed in  Feb. 2021. She initially thought she had a stomach bug. After 3  days of inability to eat/drink, she presented to the hospital and  was told that she had an internal bleed. She had IVF and  antibiotics. She was seen by a gastro here at [facility] who per the  patient did not see any reason for further testing. She has  continued to have issues since this episode. She will have an  upset stomach every 8-14 days (emesis and diarrhea). She has been  working with a nutritionist here at [facility] also. A lesion was  found on her liver during her hospitalization. 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 [name], her rheumatologist, believed  that a lesion to her right pelvis might be the cause of her  symptoms. She had scans completed. Since this episode, she  started to notice weakness to her lower extremities. She ruptured  her right hamstring in Dec. 2020. She had reconstructed surgery  in Jan. 2021. She started to have pain down her right leg. She  saw a PT and a massage therapist. She was doing therapy on her  own also. The right  leg weakness progressed. She had a lumbar  injection in Aug. 2021 at [facility] clinic. Today she reports pain  that radiates from her right lower back into right lateral thigh  to the ankle. She presents today with crutches. She cannot walk  more than 20 yards without crutches. She is unable to support her  self. She has needed crutches for 2 months. The symptoms are  improved with forward flexion at the waist.She has been told in  the past that she has scoliosis and spinal stenosis. This was  diagnosed in 2011/2012. She has not had lumbar spine surgery. She  used to use an inversion table but since she moved she has not  been able to use it. 01/26/2020: She had new thoracic and lumbar  CT scans completed. Today she reports that her lower back pain is  significant and radiates into her left hip. The pain will radiate  up to her mid-back also. She continues to have pain that radiates  from her right hip to her ankle. She reports the pain is so  severe today that she is nauseous. She notes that her mobility  has decreased over the last two weeks and an increase in her  pain. She has been having difficulty sleeping due to the pain.  She previous took oxycodone but now only takes baclofen for her  symptoms. She is currently in PT; 2x/week. She was prescribed  lofstrand crutches.  .  CURRENT MEDICATIONS  .  Taking Acetaminophen 500 MG Tablet 2 tablets as needed Orally  every 8 hrs, Notes: as needed  Taking Baclofen 10 MG Tablet 1 tab Orally BID  Taking Calcium Carbonate-Vitamin D 600-200 MG-UNIT Capsule 1  capsule with a meal Orally Twice a day  Taking Clindamycin HCl 300 MG Capsule 1 capsule Orally every 8  hrs, stop date 02/21/2020, Notes: for 7 days  Taking Famotidine 40 MG Tablet 1 tablet at bedtime Orally Once a  day, Notes: prn  Taking Gabapentin 300 MG Capsule 1 capsule Orally three times a  day  Taking Gammagard - Solution as directed Injection once every 4  weeks, Notes: every 4 weeks (infusion); ;next dose  02/20/2020 and  will have infusion postop while in hospital per [name]  Taking Glucophage XR 500 mg Tablet Extended Release 24 Hour 4  tablets Orally Once a day  Taking Hydroxychloroquine Sulfate 200 MG Tablet TAKE 1 TABLET BY  MOUTH EVERY DAY WITH FOOD OR MILK  Taking Jardiance 10 MG Tablet take 1 tablet by mouth every day  Orally Once a day  Taking Lidocaine 5 % Patch 1 patch remove after 12 hours  Externally PRN, Notes: prn  Taking Lisinopril 2.5 MG Tablet 1 tablet Orally Once a day  Taking Loftstrand crutches - 2 lofstrand crutches for ambulation  daily  Taking Mycophenolate Mofetil 500 MG Tablet 2 tablet Orally twice  daily  Taking Omeprazole 40 mg Capsule Delayed Release 1 capsule Orally  twice daily  Taking oxyCODONE HCl 5 MG Tablet 1 tablet as needed Orally every  4-6 hrs, Notes: prn  Taking Ursodiol 500 MG Tablet 1 tablet Orally Twice a day  Taking Vitamin B Complex - Tablet 1 tablet Orally once daily  Taking Vitamin D 25 MCG (1000 UT) Tablet 1 tablet Orally Once a  day  Medication List reviewed and reconciled with the patient  .  PAST MEDICAL HISTORY  .  Scleroderma - CREST dx 2007  IgG4 deficiency s/p IVIG 2010 ----single infusion given  preventively after week of bilateral knee replacements at [facility]  2010  Fx left wrist in 1994  Blood clot at LUE in 2006 on a short course of Coumadin  Bilateral carpal tunnel syndrome s/p Lt carpal tunnel release and  steroids injection right--  Bone spur left foot  Scoliosis  Spinal stenosis s/p steroids injection  s/p bilateral knee replacements for valgus /arthritic  complications, performed by Dr. Katrinka Blazing 2010--- never infected, but  packed with antibiotics with surgery;  Right rotator cuff repairs x 4, complicated by repeat tears,  infection, placement of anchor material  Elevated liver function tests  Diabetes  Seronegative RA  GI bleed  GERD  Pagets disease  Stage II fibrosis of Liver  Gastroparesis  COVID-vaccine 05/20/2019, 06/17/2019, booster 12/09/2019  (Moderna)  .  ALLERGIES  .  N.K.D.A.  .  SURGICAL HISTORY  .  Right rotator cuff repair, with infected hardware that had to be  removed 2006  Repeat right shoulder surgery, also which became infected. 2007  Left rotator cuff repair 2008  bilateral knee replacements 2009  Left arthroscopic carpal tunnel release 01/2011  ORIF Left 4th metatarsal bone  Left Ulna shortening 1994  Knee replacement 2010  Hand surgery 2013, 2015  remove gallbladder/hernia 1990  Plate left foot 3rd metatarsal 2008  Left reverse TSA 03/07/16  Left Hamstring Repair 05/2019  .  SOCIAL HISTORY  .  .  Tobacco  history: Never smoked.  .  Marital Status Single.  .  Work/Occupation: Out of work since 03/13/2019 due to current  conditions; Works for [employer] in Clinical biochemist.  .  Alcohol Former daily EtOH use in 20s.  .  Abuse/NeglectDo you feel unsafe in your relationships?No , Have  you ever been hit, kicked, punched or otherwise hurt by someone  in the past year? No , PlanResources Provided, Patient states  they feel safe to return home.  .  Illicit drugs: Denies.  [name]  HOSPITALIZATION/MAJOR DIAGNOSTIC PROCEDURE  .  as above  GI bleed 05/2019  .  VITAL SIGNS  .  Pain scale 8, Ht-in 62, Wt-lbs 156, BMI 28.53, BP 90/55, RR 16,  Pulse sitting 88T 98.4 orally 02/17/2020.  [name]  PHYSICAL EXAMINATION  .  GENERAL:  Cardiovascular  heart regular rate and rhythm. Lungs  Clear to  auscultation bilaterally. Abdomen  bowel sounds present.  NEUROSURGERY:  MOTOR Lower Extremities  : 5/5 strength in the bilateral lower  extremities. :5/5 strength in the bilateral lower extremities.  Reflexes  Left Knee Jerk Absent, Right Knee Jerk Absent, Left  Ankle Jerk Absent no left achilles, Right Ankle Jerk 2+. Left  Knee JerkAbsent , Right Knee JerkAbsent , Left Ankle JerkAbsent  no left achilles , Right Ankle Jerk2+. Gait  : Antalgic uses  crutches. :Antalgic uses crutches.  DIAGNOSTIC STUDIES:  RADIOLOGY  I reviewed lumbar ap/lateral/flexion/extension xrays  completed 12/29/2019  at [facility]. I reviewed thoracic and lumbar  spine CTs completed 01/23/2020 at [facility].[name] MRI  Uploaded to PACS;  I reviewed lumbar spine MRI completed 12/16/2019 at [facility].  .  ASSESSMENTS  .  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  Acquired spondylolisthesis - M43.19  .  She had continued debilitating pain and cannot walk independently  at all. She has lofstrand crutches and she has a WC that she used  when she had her knee replacements and thinks she likely needs to  get it out of storage and use it again. When she tries to walk  independently in the office with assist, she can't do it, and  with my assistance she can stand for a very short period of time.  The CT is very helpful as it shows an autofusion of L5-S1 and  shows severe degenerative changes worst at the L4-5 level.  Reviewing the xrays, MRI, CT scan again, and reviewing all her  medical conditions and comorbidities, I think the operation that  makes the most sense in terms of "risk/benefit" ratio for her is  L4-5 TLIF surgery to address the worst area with severe stenosis  and spondylolisthesis with likely diminishing returns coming from  operating on other levels (i.e. adding other levels likely  increases risk without increasing benefit enough)While I have  only known her for a short time, in talking to her, reviewing her  prior notes and being in touch with her other providers and their  notes, I think this surgery realistically may help some of her  pain but isn't a "cure" and all the problems she has collectively  are likely going to end up with her needing a WC or scooter on  the near horizon, which I told her.I discussed the option of L4-5  TLIF with her in detail and she would like to proceed. It is  extremely  high risk- she is at higher risks for complication  because of all the comorbidities and she understands this. The  patient lives alone; she prefers to go to rehab after surgery.  She has a gammagard IVIG  injection this Friday (she gets the  injections monthly). This is administered by [name].Most  recent A1C is 6.1 (01/13/2020). I discussed risks, benefits and  treatment alternatives with her and she would like to proceed  with surgery. She signed the surgical consent form today.She has  new cellulitic area on the left lower leg which was drained at  [facility] on 02/12/2020 in [place], culture was sent and she  was started on bactrim. She returned to [facility] on 02/14/2020  with new are which was "lanced" with a needle and another culture  was sent and she was changed to Clindamycin. Will send her to Dr.  Dorene Grebe today , her PCP to be evaluated. Will discuss with Dr.  Dionisio David. Will get records/culture report from [facility] in  [place]. [phone].  [name]  PROCEDURE CODES  .  1095 OP VISIT EP LEVEL 3 [id number] TECH  .  FOLLOW UP  .  surgery 02/24/2020  .  Electronically signed by Secundino Ginger , ANP, BC  on 02/17/2020 at 01:57 PM EST  .  Document electronically signed by Dionisio David, RON I   .

## 2020-02-20 ENCOUNTER — Ambulatory Visit

## 2020-02-20 ENCOUNTER — Ambulatory Visit: Admitting: Dermatology

## 2020-02-20 ENCOUNTER — Ambulatory Visit: Admitting: Rheumatology

## 2020-02-20 ENCOUNTER — Ambulatory Visit (HOSPITAL_BASED_OUTPATIENT_CLINIC_OR_DEPARTMENT_OTHER): Admitting: Dermatology

## 2020-02-20 ENCOUNTER — Ambulatory Visit: Admit: 2020-02-20

## 2020-02-20 NOTE — Progress Notes (Signed)
[name], [name] **DOB:** 12-Sep-1960 (59 yo F) **Acc No.** [id number] **DOS:**  02/20/2020    ---        [name], [name]**    ------    84 Y old Female, DOB: 1960-11-19    [address], [place], [place] [address]    Home: [phone]    Provider: [name]        * * *    Telephone Encounter    ---    Answered by    Cherylin Mylar    Date: 02/20/2020        Time: 12:27 PM    Caller    [email or web address]    ------            Reason    Pt will call us Friday to update about derm/OT            Message                      Renee, # [id number], iv treatments are every 3, but now spine surgery canceled, is she going back to 4 weeks?? also has active infection in leg, lots of questions, Renee needs call back asap if possible                Action Taken                      Klein,[name]  02/20/2020 12:28:21 PM >       [name],[name]  02/20/2020 1:23:36 PM > Push IVIG to next week.  Hold Cellcept while on antifungals.  IVIG will be next week.  Fungal? Left leg.  Azole?  She will call with details.      [name],[name]  02/20/2020 2:11:04 PM > Will check in with pt next week.      [name],[name]  02/23/2020 1:45:37 PM > Handicap placard.      [name],[name]  02/24/2020 11:09:06 AM > 8/10 pain.  She has active inflammation and raynaud's-can't use crutches.  She is on Doxy-started Saturday-holding Cellcept while on abx.  New surgery date is Dec 23rd as long as fungal infection has cleared.  Meaning we will need to get her IVIG a few days before that.      [facility]  02/24/2020 3:37:23 PM > Spoke with [name] and neurosurg.  Recommended 10mg  oxy daily for pain control (higher will make it difficult to contorl pain post op).  Prednisone no.  Discussed with pt.  Sending in oxy.      [name],[name]  02/24/2020 4:03:17 PM > Still need to tell pt about appt.      [name],[name]  03/01/2020 3:19:48 PM > Hi can you call her in the next couple days if you can.  How is her infection? and is the oxy  helping?  We scheduled her to see [name] at 12/10 at 12:00pm if you can see if that works for her.  Thank you mucho!!!!!!!      [name],[name]  03/01/2020 4:43:37 PM > Her hands are very painful. She can't hold anything or pick anything up. The pain is mostly at night and she can't sleep.  She wears gloves at night with diclofenac gel and lidocaine which helps somewhat. Color changes in cold only but very severe.  Taking oxycodone once daily in the morning.  She will try taking it at night as well. She has been doing some PT for her  hands but has not helped much.       She said the infection looks better on her legs except for the area where she did the biopsy which is dry and flaky but does not look infected and have stopped draining. She has had multiple cysts since the first one was drained and is still waiting for one of the biopsy results to come back which she may get on Friday at her derm apt. She is ok with the 12/10 apt at 12pm      [facility]  03/01/2020 5:20:22 PM > Thank you Thomasenia Sales.  I ordered OT script.  Have her take the oxy 2x daily for sure if it's going to help at night.  Thak you!!!!      [name],[name]  03/02/2020 12:32:43 PM > lvm x1 to talk about OT       [facility]  03/02/2020 4:07:33 PM > I spoke with pt and let her know Dr. Monico Blitz is okay with oxy below 10mg  daily and this should not complicate her post op pain control.  [name] and Dr. Monico Blitz are in communication about this.  She will call us Friday and let us know where to send OT script and if derm infection cleared sow e can restart cellcept. Thomasenia Sales or I cna talk with ot when she calls!      [name],[name]  03/03/2020 1:16:55 PM > PT can do OT send to fyzical therapy  [phone].      [name],[name]  03/03/2020 1:23:26 PM > faxing                Refills    Start oxyCODONE HCl Tablet, 5 MG, Orally, 60, 1 tablet, BID PRN,  30 days, Refills=0    ------          * * *              * * *        ---        Reason for Appointment     ---      1\. Pt will call us Friday to update about derm/OT    ---      Assessments    ---    1\. Systemic sclerosis, unspecified - M34.9    ---      Treatment    ---      **1\. Systemic sclerosis, unspecified**    _PROCEDURE: Occupational Therapy (OT)_     PT has increased hand pain and  decreased function d/t scleroderma. Please evlauate and treat for 6-8 weeks  and assist in ADL/assitive device management.    ------        **2\. Others**    Start oxyCODONE HCl Tablet, 5 MG, 1 tablet, Orally, BID PRN, 30 days, 60,  Refills 0          * * *          Provider: [name] 02/20/2020    ---    Note generated by eClinicalWorks EMR/PM Software ([email or web address])

## 2020-02-20 NOTE — Progress Notes (Signed)
 General Medicine Visit - Resident note with preceptor addendum  .  .  .  * MASheral Flow Vickii Penna)  .  Patient Details and Vitals  Patient reviewed in/by:  Office  Patient Best Phone # [phone]  Patient Email Address [name]  .  BMI: 27.42  .  BP: 1074/ 66  Ht (inches): 62.5  Weight: 151.8  BMI: 27.42  Temp: 98.6  Pulse: 79  .  Med List: PRINTED by [facility] for patient   hd_medl: printed  .  [name]  Patient Medical History   Travel outside of the [place] in past 28 days:: No  .  In the past year, have you ... Had no falls  Difficulty with balance? YES  Use a Cane NO  Use a Walker NO  Need assistance with ambulation while here? YES  .  [name]  COVID Patient Symptom Screening   Note any symptoms in the prior 10 days:   In past 14 days, exposure to anyone with above symptoms or positive test?   COVID-19 Exposure: No  In past 14 days, told by public health authoirty or healthcare professional   to quarantine?   COVID-19 Quarantine: No  COVID Vaccine; remind availability for walk-in vaccines. If NO, patient mus  t wear mask..   Been vaccinated for COVID-19: Yes  Tobacco use? never smoker  Does patient experience chronic pain ? NO  .  Patient Screening   .  .  .  .  .  .  .  .  .  .  Data to be shared to Telemed/Office Visits   February 17, 2020 3:33 PM  Screening [place]: [facility])  Elevated BP: 1074/66  Patient is a falls risk  .  ---------- ---------- ----------   ......................................Marland KitchenAngelina Sheriff  February 17, 2020 3:  33 PM  .  .  **Resident Note**  .  .  Patient Prep Updates   [place] Pre-Visit Notes:  February 17, 2020 3:33 PM  Screening [place]: [facility])  Elevated BP: 1074/66  Patient is a falls risk  .  ---------- ---------- ----------   ......................................Marland KitchenAngelina Sheriff  February 17, 2020 3:  33 PM  .  .  .  .  .  .  .  * Preceptor: Dorene Grebe [facility])  CC: skin eruptions  .  HPI: Daralyn Bert is a 59 year old female with history of scleroderma, I  gG4 deficiency, diabetes, HTN,  HLD, GIB,  Lumbar stenosis with neurogenic c  laudication who [facility] with concern for LE cellulitis  .  Red eruptions on LLE  -patient noticed red nodule on left lateral ankle on that started on thursd  ay 11/11  -saw Dr. Cherie Dark in GI who advisedher to go to UC-- [facility], nodule was aspirated and white fluid drained , culture sent was nega  tive; started on bactrim  - on Friday felt nauseous  T 100F  - new lesion appeared  on Saturday 111/13, aspirated and sent for cx (still   pending), switched to Clindamycin 11/13  - today noticed new lesion on left posterior ankle  - has not noticed any more drainage or warmth  .  .  .  .  .  .  .  .  .  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-Q21.1)  SCREENING EXAM FOR BREAST CANCER (ICD-V76.10) (ICD10-Z12.39)  ANEMIA, OTHER (ICD-285.8) (ICD10-D64.89)  LOWER EXTREMITY EDEMA (ICD-782.3) (ICD10-R60.0)  IRREGULAR HEART BEATS (ICD-427.9) (ICD10-I49.9)  GI BLEED (ICD-578.9) (VQQ59-D63.2)  SCLERODERMA (ICD-710.1) (ICD10-M34.9)  SCREENING FOR CERVICAL CANCER (ICD-V76.2) (OVF64-P32.4)  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) (ICD10-M47.812)  NECK PAIN (ICD-723.1) (ICD10-M54.2)  STEROID USE, LONG TERM (ICD-V58.65) (ICD10-Z79.52)  PREMATURE VENTRICULAR CONTRACTIONS (ICD-427.69) (ICD10-I49.3)  HYPERLIPIDEMIA (ICD-272.4) (ICD10-E78.5)  BACK PAIN, LUMBAR, CHRONIC (ICD-724.2) (ICD10-M54.5)  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)  DIABETES MELLITUS, TYPE II (ICD-250.00)  (ICD10-E11.9)  LONG-TERM (CURRENT) USE OF STEROIDS (ICD-V58.65) (ICD10-Z79.51)  SKIN SAGGING DUE TO WEIGHT LOSS (ICD-757.9) (ICD10-Q84.9)  TRANSAMINASES, SERUM, ELEVATED (ICD-790.4) (ICD10-R74.0)  OBESITY, BMI 30-34.9, ADULT (ICD-278.00) (ICD10-E66.9)      DIABETES MELLITUS, TYPE II, UNCONTROLLED (ICD-250.02) (ICD10-E11.65)      FATTY LIVER DISEASE (ICD-571.8) (ICD10-K76.0)  SCLERODERMA, 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)  HYPOTHYROIDISM (ICD-244.9) (ICD10-E03.9)  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) (ICD10-D  80.8)  SPINAL STENOSIS, 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)  HEPATITIS C EXPOSURE (HCV RNA NEGATIVE, 01/2014) (ICD-V02.62) (ICD10-Z20.5)  PAIN IN JOINT, HAND (ICD-719.44) (ICD10-M79.643)  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)  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)  CARPAL TUNNEL (ICD-354.0) (ICD10-G56.00)  OSTEOARTHRITIS (ICD-715.09) (ICD10-M15.9)  S/P ROTATOR CUFF SURGERY 12/2004 - RT SHOULDER; 2008 LT (ICD-V45.89)  Family Hx of MELANOMA, FAMILY HX (ICD-V16.8) (ICD10-Z80.8)  FRACTURE OF OTHER SPEC SITE,  PATHOLOGIC - MULTIPLE (ICD-733.19)  CHOLECYSTECTOMY AND HERNIA REPAIR (ICD-V45.89)  VITAMIN D DEFICIENCY (ICD-268.9) (ICD10-E55.9)  COLONOSCOPY, NEXT 2020- SEE COMMENT (ICD-V76.51) (ICD10-Z01.89)  PROBLEM CHANGES:  Added new problem of NODULAR RASH (ICD-782.1) (RJJ88-C16) - Signed  Added new problem of BACK PAIN, CHRONIC (ICD-724.5) (ICD10-M54.89)  .  [name]  MEDICATIONS:   PAST MEDICINES (prior to today's visit):  HYDROXYCHLOROQUINE SULFATE 200 MG ORAL TABLET (HYDROXYCHLOROQUINE SULFATE)   one tablet oral daily; Route: ORAL  JARDIANCE 10 MG ORAL TABLET (EMPAGLIFLOZIN) Take 1 tablet by mouth once dai  ly; Route: ORAL  ROSUVASTATIN 10MG  TABLETS (ROSUVASTATIN CALCIUM) TAKE 1 TABLET BY MOUTH EVE  RY DAY  LISINOPRIL 2.5MG  TABLETS (LISINOPRIL) TAKE 1 TABLET BY MOUTH ONCE DAILY  METFORMIN HCL ER 500 MG XR24H-TAB (METFORMIN HCL) TAKE 4 TABLETS BY MOUTH E  VERY MORNING  OMEPRAZOLE 20 MG  ORAL CAPSULE DELAYED RELEASE (OMEPRAZOLE) Take one capsule   by mouth twice a day; Route: ORAL  BACLOFEN 10MG  TABLETS (BACLOFEN) TAKE 1 TABLET BY MOUTH TWICE DAILY AS NEED  ED FOR BACK OR SPASMS  DICLOFENAC SODIUM 1 % TRANSDERMAL GEL (DICLOFENAC SODIUM) APP 2 GRAMS EXT A  A BID      FREESTYLE FREEDOM LITE w/Device KIT (BLOOD GLUCOSE MONITORING SUPPL) [place]  e as directed (ICD 10 E11.65)      FREESTYLE LITE BLOOD GLUCOSE STRIPS (GLUCOSE BLOOD) USE TO CHECK BLOOD   GLUCOSE THREE TIMES DAILY      FREESTYLE LANCETS (LANCETS) check fingerstick three times a day (ICD 10   E11.65)  OXYCODONE HCL 5 MG ORAL TABLET (OXYCODONE HCL) Take one tablet daily as nee  ded for pain >8. Partial fill upon patient request.; Route: ORAL  CALCIUM CARBONATE-VITAMIN D 600-200 MG-UNIT ORAL TABLET (CALCIUM CARBONATE-  VITAMIN D) take one tablet twice a day; Route: ORAL  GABAPENTIN 300MG  CAPSULES (GABAPENTIN) TAKE 2 CAPSULE BY MOUTH EVERY NIGHT   AT BEDTIME AS  NEEDED FOR PAIN  * BLOODWORK Please draw chemistry (sodium, potassium, magnesium, chloride,   bicarbonate, glucose, creatinine, BUN), CBC, TSH, lipids, HbA1c. Fax result   to [phone] Dr. Marlane Hatcher  FAMOTIDINE 20 MG TABLET (FAMOTIDINE) TAKE 1 TABLET BY MOUTH AT NIGHT AS NEE  DED  LIDOCAINE 5 % PTCH (LIDOCAINE) APPLY ONE PATCH TO BACK ONCE DAILY AS NEEDED   FOR PAIN, REMOVE AFTER 12 HRS  MYCOPHENOLATE MOFETIL 500 MG ORAL TABLET (MYCOPHENOLATE MOFETIL) Take 2 tab  lets twice daily; Route: ORAL  GAMMAGARD 20 GM/200ML INJECTION SOLUTION (IMMUNE GLOBULIN (HUMAN)) Receives   infusion every 4 weeks, next dose 8/5; Route: INJECTION  URSODIOL 500 MG ORAL TABLET (URSODIOL) Take 1 tablet twice daily; Route: OR  AL  * WHEEL CHAIR WITH FOOT PEDALS USE AS INSTRUCTED HT 62.5 WT 158.6 M54.5  M4  8.06  * SHOWER CHAIR USE AS INSTRUCTED  HT 62.5 WT 158.9 E11.9 -- M54.5 -- M89.9  * RAISED TOILET SEAT   TRAZODONE HCL 100 MG ORAL TABLET (TRAZODONE HCL) Take 1 tablet at bedtime;   Route: ORAL  * LOFT STAND  CRUTCHES USE AS INSTRUCTED  HT 62.5 WT 158.6 M54.4 -- R60.0 --   M89.9  .  MEDICINE CHANGES:  Added new medication of BACTRIM DS 800-160 MG ORAL TABLET (SULFAMETHOXAZOLE  -TRIMETHOPRIM) Take one tablet by mouth twice daily for 7 days; Route: ORAL   - Signed  Rx of BACTRIM DS 800-160 MG ORAL TABLET (SULFAMETHOXAZOLE-TRIMETHOPRIM) Tak  e one tablet by mouth twice daily for 7 days; Route: ORAL  #14[Tablet] x 0;    Signed;  Entered by: [name] -JN 6A-;  Authorized by: Ardeth Perfect MD -JN 6A-;  Method used: [facility] [place] [address] [address], [place], [place]  , Ph: [phone], Fax: [phone]; Note to Pharmacy: Route: ORAL;  Medications Reviewed:  Done  .  [name]  ALLERGIES:   No Known Allergies  Allergies Reviewed:  Done  .  [name]  DIRECTIVES:   .  .  .  Vitals:   BP: 107 / 66 mmHg Ht: 62.5 in.  Wt: 151.8 lbs.  BMI (in-lb) 27.42  Temp: 98.6deg F.   Pulse Rate: 79 bpm  .  .  Additional PE: Gen: middle aged female, NAD  CV: RRR  Pulm: CTAB, normal work of breathing  Ext: wwp, no edema, decreased muscle tone posterior left leg s/p tendon rup  ture  Skin: 2 erythematous and tender nodules lateral left ankle with no purulenc  e or warmth, 1 erthamtous and tender nosdule 1cm posterior left ankle with   central punctate scab, no drainage or warmth  .  [name]  Assessment /T/ Plan:    59 year old female with history of scleroderma, IgG4 deficiency, diabetes,   HTN, HLD, GIB,  Lumbar stenosis with neurogenic claudication who presents   from [facility] with concern for LE cellulitis  .  1. Nodular erythamtous rash LLE  Patient developed multiple 1cm erythamtous nodules over left ankle in the l  ast week, had white drainage that was sent for culture but no growth. Does   not appear to be classic cellulitis. Patient does not believe she had insec  t bites. 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 Will treat for infection to be cautious as has spinal surgery  sched  uled next Tuesday.  - Restart bactrim BS BID for 7 days   -  Dermatology referral before scheduled spinal surgery on Tuesday 11/23  .  .  .  * Resident Signature (.sign): ......................................Marland KitchenEllin Goodie MD -JN 6A-  February 17, 2020 5:33 PM  .  .  .  ORDERS:  [facility] [Ref-Derm]  Est Level 4 (MDM or 30-39 min) [CPT-99214]  .  [name]  **Preceptor Note**  Resident: Ellin Goodie)  [facility]  .  [name]  Subjective   Patient is a 59 Years Old Female who presents to clinic lesions/skin erupti  ons on left lower leg  no trauma/skin break down  .  scheduled for back surgery on Nov 23/24, She does not want to delay if poss  ible.  .  I discussed the patient with Dr. Tomasa Rand Chanetta Marshall) in a routine precepting   encounter.  I also personally interviewed and examined the patient.  I agr  ee with the patient's diagnosis and management.   .  .  Objective   see resident's note for details.    .  Plan   #skin lesion/eruptions-start bactrim for possible MRSA infection, but unlce  ar if folliculitis or another skin condition ? related to her autoimmune di  sease  urgent referral to derm for eval prior to surgery   See resident note for details  .  Seen On Team (Floor):  4B  .  [name]  .  Patient Care Plan  .  .  .  Immunization Worksheet 2019 (rev 07/09/2019)   .  .  .  .  .  .  .  .  .  .  .  .  .  Follow-up With:   .  .  [name]  Electronically Signed by Darrick Penna MD on 02/20/2020 at 3:56 PM  ________________________________________________________________________

## 2020-02-21 ENCOUNTER — Ambulatory Visit: Admitting: Dermatology

## 2020-02-23 ENCOUNTER — Ambulatory Visit

## 2020-02-23 ENCOUNTER — Ambulatory Visit: Admitting: Rheumatology

## 2020-02-23 ENCOUNTER — Ambulatory Visit: Admit: 2020-02-23 | Payer: 59

## 2020-02-23 MED ORDER — hydrocortisone 2.5 % topical ointment: 3 % | Gram | 0 refills | 30 days | Status: AC

## 2020-02-23 NOTE — Progress Notes (Signed)
[name] [name], [name] [name] **DOB:** 1960/09/07 (59 yo F) **Acc No.** [id number] **DOS:**  02/23/2020    ---        [name] [name], [name] [name]**    ------    59 Y old Female, DOB: [birth year], External MRN: [id number]    Account Number: [id number]    [address], [place], [place]    Home: [phone]    Guarantor: [name] [name] Insurance: [employer] Payer ID: PAPER    PCP: [name] Glenn Christo, MD Referring: Unknown Unknown External Visit ID: [id number]    [facility]        * * *    02/23/2020    **Appointment Provider:** [name] Boards, PsyD **CHN#:**  [id number]    ------    ---        **Current Medications**    ---    Taking      * Acetaminophen 500 MG Tablet 2 tablets as needed Orally every 8 hrs, Notes: as needed     ---    * Baclofen 10 MG Tablet 1 tab Orally BID     ---    * Calcium Carbonate-Vitamin D 600-200 MG-UNIT Capsule 1 capsule with a meal Orally Twice a day     ---    * Famotidine 40 MG Tablet 1 tablet at bedtime Orally Once a day, Notes: prn     ---    * Gabapentin 300 MG Capsule 1 capsule Orally three times a day, Notes: only takes 1x prior to sleep due to side effects     ---    * Gammagard - Solution as directed Injection once every 4 weeks, Notes: every 4 weeks (infusion); ;next dose 02/20/2020 and will have infusion postop while in hospital per [name]     ---    * Glucophage XR 500 mg Tablet Extended Release 24 Hour 4 tablets Orally Once a day     ---    * Hydroxychloroquine Sulfate 200 MG Tablet TAKE 1 TABLET BY MOUTH EVERY DAY WITH FOOD OR MILK     ---    * Jardiance 10 MG Tablet take 1 tablet by mouth every day Orally Once a day     ---    * Lidocaine 5 % Patch 1 patch remove after 12 hours Externally PRN, Notes: prn     ---    * Lisinopril 2.5 MG Tablet 1 tablet Orally Once a day     ---    * Loftstrand crutches - 2 lofstrand crutches for ambulation daily     ---    * Mycophenolate Mofetil 500 MG Tablet 2 tablet Orally twice daily     ---    * Omeprazole 40 mg  Capsule Delayed Release 1 capsule Orally twice daily     ---    * oxyCODONE HCl 5 MG Tablet 1 tablet as needed Orally every 4-6 hrs, Notes: prn     ---    * traMADol HCl 50 MG Tablet 1 tablet Orally every 6 hrs, Notes: PRN - rarely takes     ---    * Ursodiol 500 MG Tablet 1 tablet Orally Twice a day     ---    * Vitamin B Complex - Tablet 1 tablet Orally once daily     ---    * Vitamin D 25 MCG (1000 UT) Tablet 1 tablet Orally Once a day     ---    Not-Taking/PRN    *  glipiZIDE XL 5 MG Tablet Extended Release 24 Hour 1 tablet Orally Once a day     ---    * MiraLax 17 GM Packet 1 packet mixed with 8 ounces of fluid Orally Once a day     ---    * predniSONE 10 mg Tablet 1 tablet Orally Once a day     ---    * Rituxan 500 MG/50ML Solution as directed Intravenous , Notes: Last dose 05/19/19     ---      **Review of Systems**    ---    _Psychiatry ROS_ :    Skin  ____  . Head, ears, nose, throat  _  ____  . Endocrine  _  ____  .  Allergic-Immunologic  ____  . Cardiovascular  ____  . Respiratory  ____  .  Gastrointestinal  ____  . Genitourinary  ____  . Musculoskeletal  ____  .  Hematologic, lymphatic  ____  . Neurological  ____  .            **Reason for Appointment**    ---      1\. Depression due to medical problems    ---    2\. Chronic Pain    ---      **History of Present Illness**    ---    _Length of Session_ :    50 Minutes  .    _Family History_ :    **Paternal Grandparents:    ** **Maternal Grandparents:    ** **Parents/Siblings:    ** **Children:** .    _Psychiatric HPI_ :    History of Present Symptoms  [name] [name] reported that she has been experiencing  worsening medical problems since 2006. She has had 4 staph infections, 15  orthopedic surgeries, spent 3 days in the hospital due to internal belled, and  has had to learn to walk again 4 seperate times.        She recently was told she may lose the ability to walk without help and is  considering a surgery (12/23) that has a 50% survival rate and a 30% rate  of  allowing her to walk again.        Over the ast 3 months she has been crying every day and feels somewhat  helpless due to not being able to "work at a solution." She has avoided  telling her friends abour her situation due to worrying that she would upset  them with the news. She [name] sleep disturbances, ideation about wanting to  sleep and not wake up, loss of interest in things she used to enjoy, PHQ-9 = 9  (moderate depression)  .    Developmental History and Life Course  [name] [name] that she had an easy ging  temperment as a child. She adapted well at school. No birth defects, and met  developmental milestones on time.        [name] [name] did well in school, getting A's and B's.        In her adult life she has always been a hard worker and rises to chalenges by  actively looking for solutions. She had been working as a Hotel manager at  [employer], though had to take FMLA for the past year and is avoiding filing  for SSDI because she does not want to accept that she is disabled despite her  knowing that in reality she is.        [name] [name] [name] strengths that  include always feeling grateful, hard working,  and looking on the bright side of things.  .    Psychiatric History  Denied any history of therapy or medication. Denied hx of  psychiatric illness prior to the past 3 months.  .    Present suicidal ideation, plan, or attempt  Some ideation at time, no plan,  and no intent. "I would never do it, I just think it might be easier if I were  not around sometimes."  .    Present homicidal ideation, plan, or attempt  Denied  .    Describe current risk  Minimal  .    _Substance Use/Abuse/Dependence_ :    Substance Use  Denied any use of substances currently. [name] that she had  been a social drinker in the past.  .    Complications  Denied  .    Detoxes  Denied  .      **Examination**    ---    _Mental Status:_    Orientation  Alert and oriented to person, place, and time  .    Appearance/Behavior/Attitude   Casually dressed, fairly groomed, appears stated  age, calm, pleasant  .    Movement  Normokinetic without tremors or tics  .    Speech  Normal volume, rate and prosody  .    Mood  Euthymic  .    Affect  Full range, moderate intensity, appropriately reactive  .    Thought Process  Logical, linear, goal directed  .    Thought Content  Denies SI/HI and AVH; no delusions or paranoia  .    Insight  Recognizes areas of impairment and accepts treatment; able to observe  and report on him/herself  .    Judgment  Makes experience-based, thoughtful decisions; able to accurately  assess cause and effect  .    Cognition  Attention good; short-term memory good; long-term memory good; able  to abstract appropriately; calculations intact  .    _Testing Results:_    MMSE  ____  .    Beck  ____  .    SF-36  ____  .    Cage  ____  .    PHQ Score  9, moderate depression,  .    Laboratory Data  ____  .    Psychological Testing  OQ-45 = 35 (low for a clinical population)    GAD-7 = 2 (minimal anxiety)  .          **Assessments**    ---    1\. Current moderate episode of major depressive disorder without prior  episode - F32.1 (Primary)    ---    2\. Other chronic pain - G89.29    ---      [name] [name] is a friendly and personable 59 y/o female who presents for therapy  to help her woth chronic pain and depression. She has been through 15  surgeries, and may health problems associated with ehr spine and legs. She is  currently onsidering a very risky surgery as it is her best option to possibly  be able to walk again. In the past, she has coped by being a very optimistic  and active problem solver, and feels helpless to her pain and medical problems  since there is not an active solution that she has control over. We agreed to  focus therapy on 2 things:    1\. Finding a way to create meaning in her life within the context  of not  being able to move like she used to    2\. Hypnosis and behavioral therapy for pain managment    She agreed to  this plan    Formulation and Differential Diagnosis:    Diagnosis is derived from:    Clinical History__ PHQ/Connor's__ Other standardized tests (specify)__.    ---      **Treatment**    ---      **1\. Others**    Clinical Notes:  Plan was explained to and agreed on by patient.  .    ---      **Procedure Codes**    ---      [phone] PSYCH DIAGNOSTIC EVALUATION (6045)    ---    **Appointment Provider:** [name] Boards, PsyD    Electronically signed by [name] [name] , PsyD on 02/23/2020 at 02:30 PM EST    Sign off status: Completed        * * *        [facility]    [address]    [place], [place] [address]    Tel: [phone]    Fax: [phone]              * * *          Progress Note: [name] Boards, PsyD 02/23/2020    ---    Note generated by eClinicalWorks EMR/PM Software ([email or web address])

## 2020-02-23 NOTE — Progress Notes (Signed)
* * *      [name], [name] **DOB:** 1960/04/24 (59 yo F) **Acc No.** [id number] **DOS:**  02/23/2020    ---        [name], [name]**    ------    [name], DOB: 02/02/1961    [address], [place], [place] [address]    Home: [phone]    Provider: [name]        * * *    Telephone Encounter    ---    Answered by    [name]    Date: 02/23/2020        Time: 10:40 AM    Reason    handicap parking plate    ------            Message                      Pt will be dropping off the form for her handicap parking plate soon and wanted to give you the heads up  that it will be there for you to fill out.                  Action Taken                      [name],[name]  02/23/2020 10:41:19 AM >      [name],[name]  02/23/2020 10:44:39 AM > I will fill them out for sure.  I am going to do permanent plates instead of 12 months so she does not have to keep filling them out yearly.  I will keep an eye out for the form-I'm in clinic all day tomorrow so I can do then.  Thanks!                    * * *                ---          * * *          Provider: [name] 02/23/2020    ---    Note generated by eClinicalWorks EMR/PM Software ([email or web address])

## 2020-02-24 ENCOUNTER — Ambulatory Visit

## 2020-02-24 ENCOUNTER — Observation Stay: Admit: 2020-02-24

## 2020-02-24 MED ORDER — oxyCODONE HCl: 5 | 60 | Freq: Two times a day (BID) | 0 refills | 0 days | Status: AC

## 2020-02-27 ENCOUNTER — Ambulatory Visit

## 2020-02-29 LAB — UNMAPPED LAB RESULTS: BUN (EXT): 20 mg/dL (ref 7–24)

## 2020-03-01 ENCOUNTER — Ambulatory Visit

## 2020-03-02 ENCOUNTER — Ambulatory Visit: Admitting: Rheumatology

## 2020-03-02 ENCOUNTER — Ambulatory Visit

## 2020-03-02 ENCOUNTER — Ambulatory Visit: Admit: 2020-03-02 | Payer: 59

## 2020-03-02 NOTE — Progress Notes (Signed)
* * *      [name], [name] **DOB:** 12-11-1960 (59 yo F) **Acc No.** [id number] **DOS:**  03/02/2020    ---        [name], [name]**    ------    74 Y old Female, DOB: 1960/04/05    [address], [place], [place] [address]    Home: [phone]    Provider: [name]        * * *    Telephone Encounter    ---    Answered by    Vassie Moselle    Date: 03/02/2020        Time: 10:29 AM    Reason    *Other_Pharmacy_PA request    ------            Action Taken                      KATCHE,CHRIST Memory Dance 03/02/2020 10:29:26 AM > Re[facility]      KATCHE,CHRIST Memory Dance 03/02/2020 10:29:55 AM > Hi Jaclyn would you please submit a PA for oxycodone 5 mg, 1 tab twice daily as needed for other chronic pain (G89.29). Pt T/F diclofenac, Tylenol, lidocaine, tramadol, prednisone. Key [id number]. Thanks!                    * * *                ---          * * *          Provider: [name] 03/02/2020    ---    Note generated by eClinicalWorks EMR/PM Software ([email or web address])

## 2020-03-02 NOTE — Progress Notes (Signed)
* * *      [name], [name] **DOB:** [birth year] (59 yo F) **Acc No.** [id number] **DOS:**  03/02/2020    ---        [name], [name]**    ------    19 Y old Female, DOB: 04-30-1960    [address], [place], [place] [address]    Home: [phone]    Provider: Vassie Moselle        * * *    Telephone Encounter    ---    Answered by    Berdie Ogren    Date: 03/02/2020        Time: 12:56 PM    Reason    *Med_Acc_ Pharmacy    ------            Message                      RX PA approved OXYCODONE per [facility]. PA effective from 03/02/2020 to 08/29/2020. PA #[id number]. Approval letter is scanned under patient docs                Action Taken                      Lavoie,Jaclyn  03/02/2020 12:57:53 PM >      KATCHE,CHRIST ANGE G 03/02/2020 1:06:00 PM > Noted, thanks, script was sent by provider, this is a continuation of care.                     * * *                ---          * * *          Provider: Vassie Moselle 03/02/2020    ---    Note generated by eClinicalWorks EMR/PM Software ([email or web address])

## 2020-03-02 NOTE — Progress Notes (Signed)
[name] [name], [name] [name] **DOB:** [birth year] (58 yo F) **Acc No.** [id number] **DOS:**  03/02/2020    ---        [name] [name], [name] [name]**    ------    [name], DOB: 08/05/1960, External MRN: [id number]    Account Number: [id number]    [address], [place], [place]    Home: [phone]    Guarantor: [name] [name] Insurance: [id number] NH[employer] PPO    PCP: Darrick Penna Referring: Ruffin Akiva Josey, MD External Visit ID: [id number]    [facility]        * * *    03/02/2020    **Appointment Provider:** Barkley Boards, PsyD **CHN#:**  [id number]    ------    ---        ---        **Reason for Appointment**    ---      1\. Depression due to medical problems    ---    2\. Chronic Pain    ---      **History of Present Illness**    ---    _Length of Session_ :    60 Minutes  .    _Interval History and Assessment_ :    [name] [name] has been suffering from depression due to medical problems for the past  year after a series of medical issues have caused significant pain and a linit  to her functioning.  .    _Psychotherapy_ :    Psychotherapy Type:  _  ,  CBT  .    Psychotherapy Content:  Discussed ways for her to create new meaning in her  life given the limits she has due to medical problems. We focused on 2 areas:  volunteering and finding ways to work part time. We explored what limitations  have meant to her identity and how we might work to adjust her focus and  change the ways she makes meaning  .    _Therapeutic Process and Progress_ :    _  .    SHe was engaged. 2nd session and she seemed to find ways to utilize her  strengths and tx plan for the future.      **Examination**    ---    _Mental Status:_    Orientation  Alert and oriented to person, place, and time  .    Appearance/Behavior/Attitude  Casually dressed, fairly groomed, appears stated  age, calm, pleasant  .    Movement  Normokinetic without tremors or tics  .    Speech  Normal volume, rate and prosody  .    Mood  Euthymic  .    Affect   Full range, moderate intensity, appropriately reactive  .    Thought Process  Logical, linear, goal directed  .    Thought Content  Denies SI/HI and AVH; no delusions or paranoia  .    Insight  Recognizes areas of impairment and accepts treatment; able to observe  and report on him/herself  .    Judgment  Makes experience-based, thoughtful decisions; able to accurately  assess cause and effect  .    Cognition  Attention good; short-term memory good; long-term memory good; able  to abstract appropriately; calculations intact  .    TX Plan - lower OQ-45 (Baseline = 35 ) Lower PHQ-9 = 9. GOal is 50% reduction.  Will review in 4 sessions.          **Assessments**    ---  1\. Current moderate episode of major depressive disorder without prior  episode - F32.1 (Primary)    ---    2\. Other chronic pain - G89.29    ---      [name] [name] is a friendly and personable 59 y/o female who presents for therapy  to help her woth chronic pain and depression. She has been through 15  surgeries, and may health problems associated with ehr spine and legs. She is  currently onsidering a very risky surgery as it is her best option to possibly  be able to walk again. In the past, she has coped by being a very optimistic  and active problem solver, and feels helpless to her pain and medical problems  since there is not an active solution that she has control over. We agreed to  focus therapy on 2 things:    1\. Finding a way to create meaning in her life within the context of not  being able to move like she used to    2\. Hypnosis and behavioral therapy for pain managment    She agreed to this plan    Formulation and Differential Diagnosis:    Diagnosis is derived from:    Clinical History__ PHQ/Connor's__ Other standardized tests (specify)__.    ---      **Procedure Codes**    ---      [phone] Psychotherapy 53+ minutes (4158)    ---    **Appointment Provider:** Barkley Boards, PsyD    Electronically signed by Barkley Boards , PsyD on  03/07/2020 at 06:16 PM EST    Sign off status: Completed        * * *        [facility]    [address]    [place], [place] [address]    Tel: [phone]    Fax: [phone]              * * *          Progress Note: Barkley Boards, PsyD 03/02/2020    ---    Note generated by eClinicalWorks EMR/PM Software ([email or web address])

## 2020-03-03 LAB — HX COLONOSCOPY

## 2020-03-03 LAB — HX SURGICAL

## 2020-03-04 ENCOUNTER — Ambulatory Visit

## 2020-03-05 ENCOUNTER — Ambulatory Visit: Admitting: Dermatology

## 2020-03-05 ENCOUNTER — Ambulatory Visit: Admitting: Rheumatology

## 2020-03-05 ENCOUNTER — Ambulatory Visit

## 2020-03-05 ENCOUNTER — Ambulatory Visit (HOSPITAL_BASED_OUTPATIENT_CLINIC_OR_DEPARTMENT_OTHER): Admitting: Dermatology

## 2020-03-05 ENCOUNTER — Ambulatory Visit: Admit: 2020-03-05

## 2020-03-05 NOTE — Progress Notes (Signed)
* * *      [name], [name] **DOB:** [birth year] (59 yo F) **Acc No.** [id number] **DOS:**  03/05/2020    ---        [name], [name]**    ------    38 Y old Female, DOB: December 11, 1960    [address], [place], [place] [address]    Home: [phone]    Provider: [name]        * * *    Telephone Encounter    ---    Answered by    [name]    Date: 03/05/2020        Time: 10:32 AM    Message                      recent note for new [facility]. fax to [phone]. call tianna [phone] needs labs and note.        ------            Action Taken                      [name],[name]  03/05/2020 11:21:56 AM > faxing                    * * *                ---          * * *          Provider: [name] 03/05/2020    ---    Note generated by eClinicalWorks EMR/PM Software ([email or web address])

## 2020-03-06 ENCOUNTER — Ambulatory Visit: Admitting: Rheumatology

## 2020-03-06 NOTE — Progress Notes (Signed)
* * *      ROMONDA, PARKER [name] **DOB:** July 19, 1960 (59 yo F) **Acc No.** [id number] **DOS:**  03/06/2020    ---        [name], Jacquette [name]**    ------    73 Y old Female, DOB: 13-Dec-1960    [address], [place], [place] [address]    Home: [phone]    Provider: [name]        * * *    Telephone Encounter    ---    Answered by    Vassie Moselle    Date: 03/06/2020        Time: 08:19 PM    Reason    *Other_Pharmacy_[facility]    ------            Action Taken                      KATCHE,CHRIST Memory Dance 03/06/2020 8:19:24 PM > Received fax from [facility] requesting recent clinical notes (from within the last 12 months), recent IgG lab levels in order to obtain reauthorization for gammagard home infusion. Info ought to be faxed to [facility] [phone] (Attn: Hinton Rao), may call [phone].      KATCHE,CHRIST ANGE G 03/08/2020 10:49:21 AM > Faxed requested doc as requested, will call to follow-up.      KATCHE,CHRIST ANGE G 03/09/2020 12:05:36 PM > Called to follow-up, patient is all set.                    * * *                ---          * * *          Provider: [name] 03/06/2020    ---    Note generated by eClinicalWorks EMR/PM Software ([email or web address])

## 2020-03-08 ENCOUNTER — Ambulatory Visit: Admitting: Rheumatology

## 2020-03-08 NOTE — Progress Notes (Signed)
* * *      [name], [name] **DOB:** [birth year] (59 yo F) **Acc No.** [id number] **DOS:**  03/08/2020    ---        [name], [name]**    ------    70 Y old Female, DOB: 1960/05/19    [address], [place], [place] [address]    Home: [phone]    Provider: [name]        * * *    Telephone Encounter    ---    Answered by    [name]    Date: 03/08/2020        Time: 03:36 PM    Reason    New OT rx    ------            Message                      Pt states that the physical therapist needs an rx for hand PT.  Per her apt on Friday she is now cleared for surgery.  She is going to stay on the abx until it is finished. She is taking her oxycodone twice daily which is helping her pain and she is now able to sleep longer at night.                 Action Taken                      [name],[name]  03/08/2020 3:42:50 PM > I did tell her that she should find an OT for her hands as well as PT as it would be beneficial in the long term for her to have access to different adapters should she lose strength and dexterity in her hands and fingers.  She will find an OT close to home and let us know where to send the orders. Can you add a PT rx for her hands and we will fax it her current PT.      [facility]  03/08/2020 4:05:42 PM >                     * * *              * * *        ---        Reason for Appointment    ---      1\. New OT rx    ---      Assessments    ---    1\. 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 Scleroderma**    _PROCEDURE: Physical Therapy_     Please evaluate and treat for 6-8 weeks.  Bilateral hands.    ------          * * *          Provider: [name] 03/08/2020    ---    Note generated by eClinicalWorks EMR/PM Software ([email or web address])

## 2020-03-09 ENCOUNTER — Ambulatory Visit

## 2020-03-09 NOTE — Progress Notes (Signed)
[name] [name], [name] [name] **DOB:** 1960/09/25 (59 yo F) **Acc No.** [id number] **DOS:**  03/09/2020    ---      ** Progress Notes  **    ---    **Patient:** [name] [name], [name] [name]    **Account Number:** [id number]  **External MRN:** [id number]    **Appointment  Provider:** Barkley Boards, PsyD    **DOB:** [birth year]  **Age:** 59 Y  **Sex:** Female    **Date:** 03/09/2020    **Phone:** [phone]    **CHN#:** [id number]    **Address:** [address], [place], [place]    **Pcp:** PRIANKA CHAWLA        * * *        **Subjective:**        ---      **Chief Complaints:**    ------        ------      **Medical History:**        ------        **Objective:**        ---        **Assessment:**        ---        **Plan:**        ---        ------    ---    ---                ---    Electronically signed by Barkley Boards , PsyD on 03/14/2020 at 08:34 PM EST    Sign off status: Completed          * * *      **Appointment Provider:** Barkley Boards, PsyD    **Date:** 03/09/2020    ------

## 2020-03-10 ENCOUNTER — Ambulatory Visit

## 2020-03-12 ENCOUNTER — Ambulatory Visit: Admitting: Rheumatology

## 2020-03-12 ENCOUNTER — Ambulatory Visit

## 2020-03-12 ENCOUNTER — Ambulatory Visit: Admit: 2020-03-12 | Payer: 59

## 2020-03-12 NOTE — Consults (Signed)
**  Note**    Brief Rheumatology Consult Follow Up Note:        The patient reports improved pain in her hands today as well as improved  neuropathic pain in her feet. She started prednisone 10mg  daily x5 days  yesterday.        Exam:    Well-appearing, NAD    +Synovitis in the PIPs and MCPs bilaterally    +Tenderness of the toes and forefoot diffusely, no tenderness of the more  proximal joints in the feet and ankles        Recommend continuing prednisone as previously described in Rheumatology  consult note from 1/19. Patient is being discharged today and will go to the  infusion center for her regularly scheduled IVIg.        Patient discussed with Rheumatology attending Dr. Servando Salina. Recommendations  communicated to primary team.        Vertell Novak, MD    Rheumatology fellow, PGY-4    [phone]                **Electronically signed by Vertell Novak, MD on 04/22/2020 19:31**

## 2020-03-12 NOTE — Progress Notes (Signed)
 .  Progress Notes  .  Patient: [name], [name] [facility]  Provider: [name]  DOB:04-Dec-1960 Age: 59 Y Sex: Female  .  PCP: Darrick Penna    Date: 03/12/2020  .  --------------------------------------------------------------------------------  .  HISTORY OF PRESENT ILLNESS  .  GENERAL:   Assessed by [facility], under supervision of Dr.  Weldon Picking cellcept due to recent skin infections-hand much  worse without cellcept-1 month ago hands were "locked" half  flexed for 4 days until forcefully loosened-back pain managed  (  5/10 pain) now on 1 oxy AM and PM + gabapentin or  baclofen-trazodone for sleep-not helping hand pain, swelling and  stiffness worsening-some relief with topical compound of  gabapetin/lidocaine/diclofenac+Raynaud's (finger turned white and  cold for an hour, lost sensation for 24 hrs)+twitching fingers,  worse with cellcept+unpredictable shooting pains, new past couple  months-dropping things-with crutches, can't walk if can't use  hands-Surgery on Dec 22nd----IVIG infusion on the 20th, and will  need one after the surgery-struggling more mentally than before:  depression, anger, fear----in touch with therapist, but trouble  scheduling.  .  CURRENT MEDICATIONS  .  Taking Acetaminophen 500 MG Tablet 2 tablets as needed Orally  every 8 hrs, Notes: as needed  Taking Baclofen 10 MG Tablet 1 tab Orally BID  Taking Calcium Carbonate-Vitamin D 600-200 MG-UNIT Capsule 1  capsule with a meal Orally Twice a day  Taking Famotidine 40 MG Tablet 1 tablet at bedtime Orally Once a  day, Notes: prn  Taking Gabapentin 300 MG Capsule 1 capsule Orally three times a  day, Notes: only takes 1x prior to sleep due to side effects  Taking Gammagard - Solution as directed Injection once every 4  weeks, Notes: every 4 weeks (infusion); ;next dose 02/20/2020 and  will have infusion postop while in hospital per [name]  Taking Glucophage XR 500 mg Tablet Extended Release 24 Hour 4  tablets Orally  Once a day  Taking Hydroxychloroquine Sulfate 200 MG Tablet TAKE 1 TABLET BY  MOUTH EVERY DAY WITH FOOD OR MILK  Taking Jardiance 10 MG Tablet take 1 tablet by mouth every day  Orally Once a day  Taking Lidocaine 5 % Patch 1 patch remove after 12 hours  Externally PRN, Notes: prn  Taking Lisinopril 2.5 MG Tablet 1 tablet Orally Once a day  Taking Loftstrand crutches - 2 lofstrand crutches for ambulation  daily  Taking Mycophenolate Mofetil 500 MG Tablet 2 tablet Orally twice  daily  Taking Omeprazole 40 mg Capsule Delayed Release 1 capsule Orally  twice daily  Taking oxyCODONE HCl 5 MG Tablet 1 tablet as needed Orally every  4-6 hrs, Notes: prn  Taking traZODone HCl  Taking Ursodiol 500 MG Tablet 1 tablet Orally Twice a day  Taking Vitamin B Complex - Tablet 1 tablet Orally once daily  Taking Vitamin D 25 MCG (1000 UT) Tablet 1 tablet Orally Once a  day  Not-Taking/PRN glipiZIDE XL 5 MG Tablet Extended Release 24 Hour  1 tablet Orally Once a day  Not-Taking/PRN MiraLax 17 GM Packet 1 packet mixed with 8 ounces  of fluid Orally Once a day  Not-Taking/PRN predniSONE 10 mg Tablet 1 tablet Orally Once a day  Not-Taking/PRN Rituxan 500 MG/50ML Solution as directed  Intravenous , Notes: Last dose 05/19/19  Not-Taking/PRN traMADol HCl 50 MG Tablet 1 tablet Orally every 6  hrs, Notes: not taking due to nightmares  Medication List reviewed and reconciled with the patient  .  PAST MEDICAL HISTORY  .  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 Katrinka Blazing 2010--- never infected, but  packed with  antibiotics with surgery;  Right rotator cuff repairs x 4, complicated by repeat tears,  infection, placement of anchor material  Elevated liver function tests  Diabetes  Seronegative RA  GI bleed  GERD  Pagets disease  Stage II fibrosis of Liver  Gastroparesis  COVID-vaccine 05/20/2019, 06/17/2019, booster 12/09/2019 (Moderna)  .  ALLERGIES  .  N.K.D.A.  .  SURGICAL HISTORY  .  Right rotator cuff repair, with infected hardware that had to be  removed 2006  Repeat right shoulder surgery, also which became infected. 2007  Left rotator cuff repair 2008  bilateral knee replacements 2009  Left arthroscopic carpal tunnel release 01/2011  ORIF Left 4th metatarsal bone  Left Ulna shortening 1994  Knee replacement 2010  Hand surgery 2013, 2015  remove gallbladder/hernia 1990  Plate left foot 3rd metatarsal 2008  Left reverse TSA 03/07/16  Left Hamstring Repair 05/2019  .  FAMILY HISTORY  .  Mother: deceased 53 yrs, lung cancer, hyperthyroidism, diagnosed  with Other malignant neoplasm of unspecified site  Father: deceased 66 yrs, heart attack, diagnosed with Unspecified  heart disease  Siblings: alive, Brother - scleroderma, COPD  3 brother(s) .  Denies family history of GI cancer or liver disease.  .  SOCIAL HISTORY  .  .  Tobacco  history: Never smoked.  .  Marital Status Single.  .  Work/Occupation: Out of work since 03/13/2019 due to current  conditions; Works for [employer] in Clinical biochemist.  .  Alcohol Former daily EtOH use in 20s.  .  Abuse/NeglectDo you feel unsafe in your relationships?No , Have  you ever been hit, kicked, punched or otherwise hurt by someone  in the past year? No , PlanResources Provided, Patient states  they feel safe to return home.  .  Illicit drugs: Denies.  [name]  HOSPITALIZATION/MAJOR DIAGNOSTIC PROCEDURE  .  as above  GI bleed 05/2019  .  REVIEW OF SYSTEMS  .  Rheumatology:  .  General    No fever, weight loss, swollen glands . Muscoskeletal     +Joint pain, +joint swelling, +morning stiffness  . Eyes     No  vision loss, eye dry, red eye, eye pain . Mouth    No mouth sores  . Cardiovascular    +Raynaud (whitening right 4th digit); no  chest pain , irregular heart beat, racing heart beat, leg  swelling . Pulmonary    No cough, cough blood, shortness of  breath . Gastrointestinal    No Abdominal pain, N/v, diarrhea,  constipation, bloody stools, heartburn . Skin    No rash,  photosensitivity . Lymphatics    No swollen glands, tender glands  . Neurological    +numbness or tingling, +Localized weakness .  [name]  VITAL SIGNS  .  Pain scale 5, Ht-in 62, Wt-lbs 158, BMI 28.90, BP 125/80, HR 94.  [name]  EXAMINATION  .  RAPID 3: Function(0-10):  6.0. Pain(0-10):7.5.  Patient Global(0-10):0.5.  .  Rheumatology:  General Appearance: Alert and oriented , No apparent distress .  Eyes: No, scleral icterus, scleral erythema .  CV: regular rate rhythm, no murmurs.  Pulm:Clear to auscultation bilaterally.  Skin: No digital ulceration (but reported some a couple weeks  ago); mild skin changes on ankles at site of recent infection.  Psych: Feeling more depressed, angry, and scared about condition,  more difficult to cope than usual. In touch with a therapist.  Endorses intermittent passive SI, denies active SI. Able to  engage effectively with safety planning..  MuskuloskeletalElbows, shoulders, hips, knees have intact ROM.  Hand synovitis no affecting all PIPs and MCPs with tenderness to  palpation; unable to flex or extend fingers fully. There is  paraspinal muscle spasm. Knees, ankles and feet have no evidence  of synovitits..  Skin and Nails Minimal sclerodactyly, no cracking atthe  fingertips, no ulcers.  .  ASSESSMENTS  .  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  Long-term use of high-risk medication - Z79.899  .  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  Tru [name]'s arthritis has worsened since her last visit. After  she has healed from her back surgery, more aggressive  immunosuppressive therapy would be warranted. Before her  surgery,  however, we may be able to provide some relief with a short  steroid burst (10 mg x5 days), but any more extensive therapy may  harm wound healing and increase risk for peri-operative  infection.Plan:-start 10 mg prednisone for 5 days-continue pain  regmien (oxy, gabapentin, baclofen)-continue cellcept except for  5 days before surgery-continue IVIG infusions as  scheduled-continue f/u with therapist; provided crisis hotline  contact information in case mental health acutely  worsens-consider more intensive arthritis treatment in January  after surgery.  .  FOLLOW UP  .  2 Months  .  Electronically signed by Erasmo Leventhal , MD on  03/29/2020 at 09:36 AM EST  .  Document electronically signed by [name]

## 2020-03-12 NOTE — Progress Notes (Signed)
[name] [name], [name] [name] **DOB:** 03-Nov-1960 (59 yo F) **Acc No.** [id number] **DOS:**  03/12/2020    ---        [name] [name], [name] [name]**    ------    59 Y old Female, DOB: [birth year], External MRN: [id number]    Account Number: [id number]    [address], [place], [place]-[address]    Home: [phone]    Guarantor: [name] Insurance: [employer]    PCP: [name] [name] Referring: [name] Frederick, MD    Appointment Facility: Rheumatology, Allergy and Immunology        * * *    03/12/2020    Progress Notes: [name] Pimple, MD **CHN#:** [id number]    ------    ---        **History of Present Illness**    ---    _GENERAL_ :    Assessed by [facility], under supervision of [name]    -holding cellcept due to recent skin infections     -hand much worse without cellcept     -1 month ago hands were "locked" half flexed for 4 days until forcefully loosened     -back pain managed (~5/10 pain) now on 1 oxy AM and PM + gabapentin or baclofen     -trazodone for sleep     -not helping hand pain, swelling and stiffness worsening     -some relief with topical compound of gabapetin/lidocaine/diclofenac     +Raynaud's (finger turned white and cold for an hour, lost sensation for 24  hrs)    +twitching fingers, worse with cellcept    +unpredictable shooting pains, new past couple months    -dropping things     -with crutches, can't walk if can't use hands     -Surgery on Dec 22nd     ----IVIG infusion on the 20th, and will need one after the surgery    -struggling more mentally than before: depression, anger, fear     ----in touch with therapist, but trouble scheduling.      **Current Medications**    ---    Taking      * Acetaminophen 500 MG Tablet 2 tablets as needed Orally every 8 hrs, Notes: as needed     ---    * Baclofen 10 MG Tablet 1 tab Orally BID     ---    * Calcium Carbonate-Vitamin D 600-200 MG-UNIT Capsule 1 capsule with a meal Orally Twice a day     ---    * Famotidine 40 MG Tablet 1 tablet at  bedtime Orally Once a day, Notes: prn     ---    * Gabapentin 300 MG Capsule 1 capsule Orally three times a day, Notes: only takes 1x prior to sleep due to side effects     ---    * Gammagard - Solution as directed Injection once every 4 weeks, Notes: every 4 weeks (infusion); ;next dose 02/20/2020 and will have infusion postop while in hospital per [name]     ---    * Glucophage XR 500 mg Tablet Extended Release 24 Hour 4 tablets Orally Once a day     ---    * Hydroxychloroquine Sulfate 200 MG Tablet TAKE 1 TABLET BY MOUTH EVERY DAY WITH FOOD OR MILK     ---    * Jardiance 10 MG Tablet take 1 tablet by mouth every day Orally Once a day     ---    *  Lidocaine 5 % Patch 1 patch remove after 12 hours Externally PRN, Notes: prn     ---    * Lisinopril 2.5 MG Tablet 1 tablet Orally Once a day     ---    * Loftstrand crutches - 2 lofstrand crutches for ambulation daily     ---    * Mycophenolate Mofetil 500 MG Tablet 2 tablet Orally twice daily     ---    * Omeprazole 40 mg Capsule Delayed Release 1 capsule Orally twice daily     ---    * oxyCODONE HCl 5 MG Tablet 1 tablet as needed Orally every 4-6 hrs, Notes: prn     ---    * traZODone HCl     ---    * Ursodiol 500 MG Tablet 1 tablet Orally Twice a day     ---    * Vitamin B Complex - Tablet 1 tablet Orally once daily     ---    * Vitamin D 25 MCG (1000 UT) Tablet 1 tablet Orally Once a day     ---    Not-Taking/PRN    * glipiZIDE XL 5 MG Tablet Extended Release 24 Hour 1 tablet Orally Once a day     ---    * MiraLax 17 GM Packet 1 packet mixed with 8 ounces of fluid Orally Once a day     ---    * predniSONE 10 mg Tablet 1 tablet Orally Once a day     ---    * Rituxan 500 MG/50ML Solution as directed Intravenous , Notes: Last dose 05/19/19     ---    * traMADol HCl 50 MG Tablet 1 tablet Orally every 6 hrs, Notes: not taking due to nightmares     ---    Medication List reviewed and reconciled with the patient    ---      **Past Medical History**    ---       Scleroderma - CREST dx 2007.        ---    IgG4 deficiency s/p IVIG 2010 ----single infusion given preventively after  week of bilateral knee replacements at [facility] 2010.        ---    Fx left wrist in 1994.        ---    Blood clot at LUE in 2006 on a short course of Coumadin.        ---    Bilateral carpal tunnel syndrome s/p Lt carpal tunnel release and steroids  injection right--.        ---    Bone spur left foot.        ---    Scoliosis.        ---    Spinal stenosis s/p steroids injection.        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 Katrinka Blazing 2010--- never infected, but packed with antibiotics with  surgery;.        ---    Right rotator cuff repairs x 4, complicated by repeat tears, infection,  placement of anchor material.        ---    Elevated liver function tests.        ---    Diabetes.        ---    Seronegative RA.        ---    GI bleed .        ---  GERD.        ---    Pagets disease.        ---    Stage II fibrosis of Liver.        ---    Gastroparesis.        ---    COVID-vaccine 05/20/2019, 06/17/2019, booster 12/09/2019 [name] [name]).        ---      **Surgical History**    ---      Right rotator cuff repair, with infected hardware that had to be removed  2006    ---    Repeat right shoulder surgery, also which became infected. 2007    ---    Left rotator cuff repair 2008    ---    bilateral knee replacements 2009    ---    Left arthroscopic carpal tunnel release 01/2011    ---    ORIF Left 4th metatarsal bone    ---    Left Ulna shortening 1994    ---    Knee replacement 2010    ---    Hand surgery 2013, 2015    ---    remove gallbladder/hernia 1990    ---    Plate left foot 3rd metatarsal 2008    ---    Left reverse TSA 03/07/16    ---    Left Hamstring Repair 05/2019    ---      **Family History**    ---      Mother: deceased 74 yrs, lung cancer, hyperthyroidism, diagnosed with Other  malignant neoplasm of unspecified site    ---    Father:  deceased 50 yrs, heart attack, diagnosed with Unspecified heart  disease    ---    Siblings: alive, Brother - scleroderma, COPD    ---    3 brother(s) .    ---    Denies family history of GI cancer or liver disease.    ---      **Social History**    ---    Tobacco history: Never smoked.    Marital Status  Single.    Work/Occupation: Out of work since 03/13/2019 due to current conditions; Works  for [employer] in Clinical biochemist.    Alcohol  Former daily EtOH use in 20s.    Abuse/Neglect  Do you feel unsafe in your relationships? No  , Have you ever  been hit, kicked, punched or otherwise hurt by someone in the past year? No  ,  Plan Resources Provided, Patient states they feel safe to return home.    Illicit drugs: Denies.      **Allergies**    ---      N.K.D.A.    ---      **Hospitalization/Major Diagnostic Procedure**    ---      as above    ---    GI bleed 05/2019    ---      **Review of Systems**    ---    _Rheumatology_ :    General  No fever, weight loss, swollen glands  . Muscoskeletal  \+ **Joint  pain, +joint swelling, +morning stiffness** . Eyes  No vision loss, eye dry,  red eye, eye pain  . Mouth  No mouth sores  . Cardiovascular  **+Raynaud  (whitening right 4th digit);** no chest pain , irregular heart beat, racing  heart beat, leg swelling  . Pulmonary  No cough, cough blood, shortness of  breath  . Gastrointestinal  No Abdominal  pain, N/v, diarrhea, constipation,  bloody stools, heartburn  . Skin  No rash, photosensitivity  . Lymphatics  No  swollen glands, tender glands  . Neurological ** +numbness or tingling,  +Localized weakness  ** .          **Vital Signs**    ---    Pain scale **5** , Ht-in 62, Wt-lbs **158** , BMI  **28.90** , BP **125/80** ,  HR **94** .      **Examination**    ---    _RAPID 3:_    Function  (0-10): 6.0  .        Pain  (0-10): 7.5  .        Patient Global  (0-10): 0.5  .    _Rheumatology:_    General Appearance:  Alert and oriented  ,  No apparent distress  .    Eyes:  No   ,  scleral icterus  ,  scleral erythema  .    CV:  regular rate rhythm, no murmurs  .    Pulm:  Clear to auscultation bilaterally  .    Skin:  No digital ulceration (but reported some a couple weeks ago); mild skin  changes on ankles at site of recent infection  .    Psych:  Feeling more depressed, angry, and scared about condition, more  difficult to cope than usual. In touch with a therapist. Endorses intermittent  passive SI, denies active SI. Able to engage effectively with safety planning.  .    Muskuloskeletal  Elbows, shoulders, hips, knees have intact ROM. Hand  synovitis no affecting all PIPs and MCPs with tenderness to palpation; unable  to flex or extend fingers fully. There is paraspinal muscle spasm. Knees,  ankles and feet have no evidence of synovitits.  .    Skin and Nails  Minimal sclerodactyly, no cracking atthe fingertips, no ulcers  .          **Assessments**    ---    1\. Scleroderma - M34.9 (Primary)    ---    2\. Long-term use of high-risk medication - Z79.899    ---    3\. 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 After she has  healed from her back surgery, more aggressive immunosuppressive therapy would  be warranted. Before her surgery, however, we may be able to provide some  relief with a short steroid burst (10 mg x5 days), but any more extensive  therapy may harm wound healing and increase risk for peri-operative infection.    Plan:    -start 10 mg prednisone for 5 days     -continue pain regmien (oxy, gabapentin, baclofen)     -continue cellcept except for 5 days before surgery     -continue IVIG infusions as scheduled     -continue f/u with therapist; provided crisis hotline contact information in case mental health acutely worsens     -consider more intensive arthritis treatment in January after surgery.     ---      **Follow Up**    ---    2 Months    Electronically signed by Erasmo Leventhal , MD on 03/29/2020  at 09:36 AM EST    Sign off status: Completed        * * *        Rheumatology, Allergy and Immunology    [address]  [address]    [place], [place] [address]    Tel: [phone]    Fax: [phone]              * * *          Progress Note: [name] Pimple, MD 03/12/2020    ---    Note generated by eClinicalWorks EMR/PM Software ([email or web address])

## 2020-03-16 ENCOUNTER — Ambulatory Visit: Admitting: Physical Medicine & Rehabilitation

## 2020-03-16 NOTE — Progress Notes (Signed)
* * *      [name], [name] **DOB:** December 12, 1960 (59 yo F) **Acc No.** [id number] **DOS:**  03/16/2020    ---        [name], [name]**    ------    89 Y old Female, DOB: 14-May-1960    [address], [place], [place] [address]    Home: [phone]    [name]        * * *    Telephone Encounter    ---    Answered by    Piedad Climes    Date: 03/16/2020        Time: 08:53 AM    Caller    [name]    ------            Reason    requesting perscription            Message                      Hello,  this patient is requesting a perscription for two large safety bars suction cups for the shower wall. She requested it to be sent to [facility] in [place]. Fax #[phone]                 Action Taken                      [facility]  03/16/2020 9:00:49 AM > Sent request to MD      [facility]  03/16/2020 12:33:06 PM > wrote rx, to be faxed out today                    * * *                ---          * * *          [name], Korri Ask 03/16/2020    ---    Note generated by eClinicalWorks EMR/PM Software ([email or web address])

## 2020-03-22 ENCOUNTER — Ambulatory Visit: Admitting: Physical Medicine & Rehabilitation

## 2020-03-22 ENCOUNTER — Ambulatory Visit: Admitting: Rheumatology

## 2020-03-22 NOTE — Progress Notes (Signed)
* * *      GIA, [name] **DOB:** Jan 21, 1961 (59 yo F) **Acc No.** [id number] **DOS:**  03/22/2020    ---        [name], [name]**    ------    [name], DOB: 1960-12-30    [address], [place], [place] [address]    Home: [phone]    [name]        * * *    Telephone Encounter    ---    Answered by    Dondra Prader    Date: 03/22/2020        Time: 11:03 AM    Reason    script    ------            Message                      [name] is also requesting a prescription for a Handheld Shower Head if possible to be faxed to [facility] in [place]. Fax [phone]. forgot to mention it the other day when she called.                Action Taken                      [facility]  03/22/2020 11:04:56 AM > sent to MD      [facility]  03/22/2020 12:01:47 PM > gave patient a call just to see how she is doing. She did not have surgery yet as planned because she had a wound infection on her leg. That has now cleared and she is scheduled for 12/22. I also told her that the rehab facility will also assess and evaluate what other OT/PT needs she has prior to discharge home so anything after that, I am happy to help with. She will keep in touch after discharge for rehab needs outpatient. All other questions answered. We will send this rx into the pharmacy as requested.                    * * *                ---          * * *          ProviderGaynelle [name], [name] 03/22/2020    ---    Note generated by eClinicalWorks EMR/PM Software ([email or web address])

## 2020-03-22 NOTE — Progress Notes (Signed)
[name], [name] **DOB:** 06-May-1960 (59 yo F) **Acc No.** [id number] **DOS:**  03/22/2020    ---        [name], [name]**    ------    [name], DOB: 1960/04/23    [address], [address] [place], [place], [place] [address]    Home: [phone]    Provider: [name]        * * *    Telephone Encounter    ---    Answered by    [name]    Date: 03/22/2020        Time: 09:48 AM    Reason    Appt    ------            Message                      Pt lvm saying she is doing worse than she was when she was last here for her apt. She would like a call back to discuss                 Action Taken                      [name],[name]  03/22/2020 11:36:43 AM > She asked to speak with [name]  03/22/2020 12:08:20 PM > She is having tremors/spasms of arms and hands and then her legs-twitched while using.  She was on prednisone-5 days.  Eye twitching.  She is getting infusion so we will add on labs to assess.  Add on: Phos, Mg, Ca, BUN/Cr, glucose, Liver, kidney, CBC.      [name],[name]  03/22/2020 12:36:37 PM > Gave verbal to nurse to add CK, Mg, Ca, Phos.  Will call pt with results.      [name],[name]  03/23/2020 2:30:05 PM > Labs look great.  Surgery tomorrow.  Rheum team is aware and will take good care of her.      [name],[name]  03/23/2020 5:26:46 PM > Discussing IVIG postop with team.  Dr Smith Robert did want her getting infusion a postop either inpt or in rehab.  Checking with Fellow.      [name],[name]  03/29/2020 11:33:34 AM > Inpt team is taking care of pt.        [name],[name]  03/29/2020 11:33:47 AM > Hi Admin.  Can we schedule pt for f/u with [name] in Feb on a Friday.  We cna have inpt team notify her she is post op inpt and will be in rehab recovering for a while.  Let me know!  Thanks!      [facility]  03/29/2020 11:43:24 AM > added to the schedule for 05/07/20 at 10:40 with [name].      [name],[name]  03/29/2020 12:14:41 PM > Can you  just let her know about this appt?  Since we are consulted on her still?  Just want an appt on the books.  If it is too soon we can reschedule.  Thanks!!!!!      [facility]  03/29/2020 2:31:57 PM > Patient will be made aware of followup appt, thanks                    * * *                ---          * * *  Provider: [name] 03/22/2020    ---    Note generated by eClinicalWorks EMR/PM Software ([email or web address])

## 2020-03-24 ENCOUNTER — Ambulatory Visit

## 2020-03-24 ENCOUNTER — Inpatient Hospital Stay
Admit: 2020-03-24 | Disposition: A | Discharge status: Rehabilitation Facility | Source: Ambulatory Visit | Attending: Neurological Surgery | Admitting: Neurological Surgery

## 2020-03-24 ENCOUNTER — Observation Stay: Admission: RE | Admit: 2020-03-24 | Payer: 59

## 2020-03-24 LAB — HX POINT OF CARE
HX GLUCOSE-POCT: 157 mg/dL — ABNORMAL HIGH (ref 70–139)
HX GLUCOSE-POCT: 123 mg/dL (ref 70–139)

## 2020-03-24 NOTE — Progress Notes (Signed)
**  Note**    **Subjective**    Patient seen and examined at bedside.    The patient reports she is in pain but that she expects this. She is getting  pain meds.    She feels her finger swelling has gone down some from yesterday.    No other changes.        **Objective**    VS reviewed.    Gen: In NAD; arousable to voice and answering questions appropriately    HEENT: NCAT    CV: RRR    Pulm: B/l CTA    Abd: Soft, NT, ND    Neuro: No focal deficits    Skin: Sclerodactyly appreciated in distal fingers (namely between PIPs and  PIPs).    MSK: Possible mild improvement from yesterday in right > left MCP synovitis,  with swelling and pain on palpation. Bilateral PIP synovitis also appreciated.  Unable to make a fist bilaterally. No other joints with synovitis or ROM  deficits.        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  Rheumatology has been consulted for management of scleroderma post-op. It's  clear she has synovitis and puffy fingers / mild sclerodactyly in the  bilateral hands. Since she was seen after her surgery today, she reported her  incisional pains were worse than her synovitis. We will keep the following  plan:        -Outpatient rheumatologist spoken to. Further immunosuppression changes can occur in the outpatient setting.      -Pt has clear synovitis in the hands / puffy fingers and mild sclerodactyly; if this were to become bad enough in the next few days,  could consider low dose prednisone (maybe 5mg  since 10mg  worked so well for her in the last few weeks), however that's if the primary is OK with it given need for wound/incision healing and also only if the patient felt especially symptomatic.     -Clarified with primary team OK to continue hydroxychloroquine and reasonable to restart cellcept in 1-2 weeks as long as no infection concerns    -Will clarify timing of next IVIG administration; have reached out to primary rheumatologist and clinical pharmacy.         Thank you for allowing [place] to participate in this patient's care.        Patient discussed and examined with attending Rheumatologist, Dr. Leonette Most.                  **Electronically signed by Monia Pouch, MD on 03/25/2020 11:50**

## 2020-03-24 NOTE — Progress Notes (Signed)
 **Neurosurgery**    Neurosurgery Post Operative POD # 0.    Procedure 03/24/20: left L4-5 TLIF with bilateral screws.    **Neurosurgery**    Neurosurgery Post Operative POD # 0.    Procedure 03/24/20: left L4-5 TLIF with bilateral screws.    **Subjective**    Subjective POST OP CHECK:    Doing well in PACU. No issues.    **Subjective**    Subjective POST OP CHECK:    Doing well in PACU. No issues.    **Exam**    Comment VS: HR , BP , RR , O2 %    Gen: Lying in bed comfortably, follows commands.    Mental status: Awake, alert, attentive.    CN: Face grossly symmetric, Hearing grossly intact    Respiratory: Breathing comfortably on room air; no accessory muscle use    Cardiac: Normal sinus rhythm on telemetry    Strength: Full 5/5 strength in bilateral Delt, Bi, Tri, Grip    Full 5/5 strength in bilateral IP, Q, AT, EHL, G    Sensation intact to light touch throughout upper and lower extremities    Dressing: C/D/I    JP drain in place draining serosanguinous output**    GU: Foley**/no foley** noted    .    **Exam**    Comment VS: HR , BP , RR , O2 %    Gen: Lying in bed comfortably, follows commands.    Mental status: Awake, alert, attentive.    CN: Face grossly symmetric, Hearing grossly intact    Respiratory: Breathing comfortably on room air; no accessory muscle use    Cardiac: Normal sinus rhythm on telemetry    Strength: Full 5/5 strength in bilateral Delt, Bi, Tri, Grip    Full 5/5 strength in bilateral IP, Q, AT, EHL, G    Sensation intact to light touch throughout upper and lower extremities    Dressing: C/D/I    JP drain in place draining serosanguinous output**    GU: Foley**/no foley** noted    .    **Assessment and Plan**    Medication              -   **SENNA (SENOKOT)** 17.2 MG Oral QDAY for 60 Days   Pending Date: 03/25/2020          -   **EMPAGLIFLOZIN (JARDIANCE)** 10 MG Oral QDAY for 60 Days   Pending Date: 03/25/2020          -   **POLYETHYLENE GLYCOL [facility])** 17 G Oral QDAY for 60 Days    Pending Date: 03/25/2020          -   **LISINOPRIL (PRINIVIL)** 2.5 MG Oral QDAY for 60 Days   Pending Date: 03/25/2020          -   **PANTOPRAZOLE DR (PROTONIX)** 40 MG Oral QDAY for 60 Days, Clinician Dir:IC FOR OMEPRAZOLE 40MG    Pending Date: 03/25/2020          -   **HYDROXYCHLOROQUINE (PLAQUENIL)** 200 MG Oral QDAY for 60 Days   Pending Date: 03/25/2020          -   **FAMOTIDINE (PEPCID)** 20 MG Oral QHS for 60 Days   Pending Date: 03/24/2020          -   **TRAZODONE (DESYREL)** 100 MG Oral QHS for 60 Days   Pending Date: 03/24/2020          -   **URSODIOL (URSO)** 500 MG Oral BID for 60 Days  Pending Date: 03/24/2020          -   **MYCOPHENOLATE MOFETIL (CELLCEPT)** 1000 MG Oral BID for 60 Days   Pending Date: 03/24/2020          -   **gabapentin 300 mg (gabapentin)** 1 CAP Oral capsule QHS  Pending Date: 03/24/2020          -   **baclofen 10 mg (baclofen)** 1 TAB Oral tablet BID  Pending Date: 03/24/2020          -   **HEPARIN** 5000 UNITS Subcutaneous Q8H for 60 Days   Pending Date: 03/24/2020          -   **NORMAL SALINE FLUSH 3 ML SYR (SALINE FLUSH)** 3 ML Intravenous Q8H for 60 Days, Clinician UJW:[id number] FOR PERIPHERAL LINE   Pending Date: 03/24/2020          -   **clindamycin HCl 300 mg (clindamycin HCl)** 2 CAP Oral capsule Q8H  Pending Date: 03/24/2020          -   ** ** ** **INSULIN LISPRO (HUMALOG) (HUMALOG)******** Sliding Scale Subcutaneous TIDWM for 60 Days   Pending Date: 03/24/2020     For Glucose 80 to 150 Give 0 Units    For Glucose 151 to 200 Give 2 Units    For Glucose 201 to 250 Give 4 Units    For Glucose 251 to 300 Give 6 Units    Notify Physician if: Call HO for glucose > 300          -  "lidocaine (Lidoderm) 5 % adhesive patch,medicated 1 patch transdermal daily PRN pain"          -   **LABETALOL (TRANDATE)** 20 MG Intravenous Q4HPRN SBP > 180 mmHg and HR > 70 bpm for 60 Days           -   **OXYCODONE (PERCOLONE)** 5 MG Oral Q4HPRN Pain 4-6 for 7 Days            -   **GLUCAGON** 1 MG Intravenous PRN BG < 60 and no IV access for 60 Days, Clinician Dir:MAY BE GIVEN IM OR SUB-Q           -   **ACETAMINOPHEN (TYLENOL)** 650 MG Oral Q4HPRN pain 1-3 or T > 38 C for 60 Days, Clinician Dir:NOT TO EXCEED 4 G OF APAP IN 24 HOURS           -   **NACL 0.9% (STOCK)** (1000 ML bag) Intravenous @80mL /Hour Over 12.5H for 60 Days, Clinician NWG:[id number] WHEN TAKING ADEQUATE PO           -   **CYCLOBENZAPRINE (FLEXERIL)** 10 MG Oral Q8HPRN Spasms for 60 Days           -   **DIPHENHYDRAMINE (BENADRYL)** 25 MG Oral Q6HPRN Itching for 60 Days           -   **HYDRALAZINE (APRESOLINE)** 10 MG Intravenous Q4HPRN SBP 140-180 mmHg & HR <=70 bpm for 60 Days           -   **HYDROMORPHONE (DILAUDID)** 0.5 MG Intravenous Q4HPRN pain 4-6 for 7 Days, Clinician Dir:FOR PAIN NOT CONTROLLED BY PO MED           -   **HYDROMORPHONE (DILAUDID)** 1 MG Intravenous Q2HPRN pain 7-10 for 7 Days, Clinician Dir:FOR PAIN NOT CONTROLLED BY PO MED           -   **HYDRALAZINE (APRESOLINE)** 20 MG Intravenous Q4HPRN  SBP > 180 mmHg & HR <= 70 bpm for 60 Days           -   **LABETALOL (TRANDATE)** 10 MG Intravenous Q4HPRN SBP 140-180 mmHg & HR > 70 bpm for 60 Days           -   **DEXTROSE 50% SYRINGE (DEXTROSE 50%)** 12.5 G Intravenous PRN BG < 60 and can not take PO for 60 Days           -   **OXYCODONE (PERCOLONE)** 10 MG Oral Q4HPRN Pain 7-10 for 7 Days           -   **ONDANSETRON (ZOFRAN)** 4 MG Intravenous Q6HPRN Nausea for 60 Days           -   **GLUCAGON** 1 MG Intravenous PRN BG < 60 and no IV access for 60 Days, Clinician Dir:MAY BE GIVEN IM OR SUB-Q           -   **DEXTROSE 50% SYRINGE (DEXTROSE 50%)** 12.5 G Intravenous PRN BG < 60 and can not take PO for 60 Days           Neurosurgery Assessment and Plan Comment 59yoF s/p left L4-5 TLIF with  bilateral screws. Doing well post-operatively.        Plan:    - Pacu to PG5N    - Pain control: Tylenol PRN, Flexeril 10mg  q8h,  Oxycodone 5-10mg  q4h PRN,  Dilaudid 0.5-1mg  IV q2h PRN    - PT/OT, OOB    - Rheum consult for advanced scleroderma, appreciate recs    - Neuro checks q4h    - Continue hemodynamic monitoring    - Encourage IS    - Wean O2 as tolerated, SpO2 goal > 92%    - Continue home medications: empagliflozin, lisinopril, baclofen,  clindamycin, urosidiol, hydroxychloroquine    - SBP 90-140, PRN Hydralazine and Labetalol    - ADAT, diabetic    - LISS for sugar coverage    - Bowel regimen PRN while taking narcotics    - Continue IVF's until taking good PO    - SCDs now, start SQ heparin tonight for DVT prophylaxis        Jerene Canny [facility]    Neurosurgery p4000.    **Assessment and Plan**    Medication              -   **SENNA (SENOKOT)** 17.2 MG Oral QDAY for 60 Days   Pending Date: 03/25/2020          -   **EMPAGLIFLOZIN (JARDIANCE)** 10 MG Oral QDAY for 60 Days   Pending Date: 03/25/2020          -   **POLYETHYLENE GLYCOL [facility])** 17 G Oral QDAY for 60 Days   Pending Date: 03/25/2020          -   **LISINOPRIL (PRINIVIL)** 2.5 MG Oral QDAY for 60 Days   Pending Date: 03/25/2020          -   **PANTOPRAZOLE DR (PROTONIX)** 40 MG Oral QDAY for 60 Days, Clinician Dir:IC FOR OMEPRAZOLE 40MG    Pending Date: 03/25/2020          -   **HYDROXYCHLOROQUINE (PLAQUENIL)** 200 MG Oral QDAY for 60 Days   Pending Date: 03/25/2020          -   **FAMOTIDINE (PEPCID)** 20 MG Oral QHS for 60 Days   Pending Date: 03/24/2020          -   **  TRAZODONE (DESYREL)** 100 MG Oral QHS for 60 Days   Pending Date: 03/24/2020          -   **URSODIOL (URSO)** 500 MG Oral BID for 60 Days   Pending Date: 03/24/2020          -   **MYCOPHENOLATE MOFETIL (CELLCEPT)** 1000 MG Oral BID for 60 Days   Pending Date: 03/24/2020          -   **gabapentin 300 mg (gabapentin)** 1 CAP Oral capsule QHS  Pending Date: 03/24/2020          -   **baclofen 10 mg (baclofen)** 1 TAB Oral tablet BID  Pending Date: 03/24/2020          -   **HEPARIN** 5000  UNITS Subcutaneous Q8H for 60 Days   Pending Date: 03/24/2020          -   **NORMAL SALINE FLUSH 3 ML SYR (SALINE FLUSH)** 3 ML Intravenous Q8H for 60 Days, Clinician ZOX:WRUEA FOR PERIPHERAL LINE   Pending Date: 03/24/2020          -   **clindamycin HCl 300 mg (clindamycin HCl)** 2 CAP Oral capsule Q8H  Pending Date: 03/24/2020          -   ** ** ** **INSULIN LISPRO (HUMALOG) (HUMALOG)******** Sliding Scale Subcutaneous TIDWM for 60 Days   Pending Date: 03/24/2020     For Glucose 80 to 150 Give 0 Units    For Glucose 151 to 200 Give 2 Units    For Glucose 201 to 250 Give 4 Units    For Glucose 251 to 300 Give 6 Units    Notify Physician if: Call HO for glucose > 300          -  "lidocaine (Lidoderm) 5 % adhesive patch,medicated 1 patch transdermal daily PRN pain"          -   **LABETALOL (TRANDATE)** 20 MG Intravenous Q4HPRN SBP > 180 mmHg and HR > 70 bpm for 60 Days           -   **OXYCODONE (PERCOLONE)** 5 MG Oral Q4HPRN Pain 4-6 for 7 Days           -   **GLUCAGON** 1 MG Intravenous PRN BG < 60 and no IV access for 60 Days, Clinician Dir:MAY BE GIVEN IM OR SUB-Q           -   **ACETAMINOPHEN (TYLENOL)** 650 MG Oral Q4HPRN pain 1-3 or T > 38 C for 60 Days, Clinician Dir:NOT TO EXCEED 4 G OF APAP IN 24 HOURS           -   **NACL 0.9% (STOCK)** (1000 ML bag) Intravenous @80mL /Hour Over 12.5H for 60 Days, Clinician VWU:[id number] WHEN TAKING ADEQUATE PO           -   **CYCLOBENZAPRINE (FLEXERIL)** 10 MG Oral Q8HPRN Spasms for 60 Days           -   **DIPHENHYDRAMINE (BENADRYL)** 25 MG Oral Q6HPRN Itching for 60 Days           -   **HYDRALAZINE (APRESOLINE)** 10 MG Intravenous Q4HPRN SBP 140-180 mmHg & HR <=70 bpm for 60 Days           -   **HYDROMORPHONE (DILAUDID)** 0.5 MG Intravenous Q4HPRN pain 4-6 for 7 Days, Clinician Dir:FOR PAIN NOT CONTROLLED BY PO MED           -   **HYDROMORPHONE (  DILAUDID)** 1 MG Intravenous Q2HPRN pain 7-10 for 7 Days, Clinician Dir:FOR PAIN NOT CONTROLLED BY  PO MED           -   **HYDRALAZINE (APRESOLINE)** 20 MG Intravenous Q4HPRN SBP > 180 mmHg & HR <= 70 bpm for 60 Days           -   **LABETALOL (TRANDATE)** 10 MG Intravenous Q4HPRN SBP 140-180 mmHg & HR > 70 bpm for 60 Days           -   **DEXTROSE 50% SYRINGE (DEXTROSE 50%)** 12.5 G Intravenous PRN BG < 60 and can not take PO for 60 Days           -   **OXYCODONE (PERCOLONE)** 10 MG Oral Q4HPRN Pain 7-10 for 7 Days           -   **ONDANSETRON (ZOFRAN)** 4 MG Intravenous Q6HPRN Nausea for 60 Days           -   **GLUCAGON** 1 MG Intravenous PRN BG < 60 and no IV access for 60 Days, Clinician Dir:MAY BE GIVEN IM OR SUB-Q           -   **DEXTROSE 50% SYRINGE (DEXTROSE 50%)** 12.5 G Intravenous PRN BG < 60 and can not take PO for 60 Days           Neurosurgery Assessment and Plan Comment 59yoF s/p left L4-5 TLIF with  bilateral screws. Doing well post-operatively.        Plan:    - Pacu to PG5N    - Pain control: Tylenol PRN, Flexeril 10mg  q8h, Oxycodone 5-10mg  q4h PRN,  Dilaudid 0.5-1mg  IV q2h PRN    - PT/OT, OOB    - Rheum consult for advanced scleroderma, appreciate recs    - Neuro checks q4h    - Continue hemodynamic monitoring    - Encourage IS    - Wean O2 as tolerated, SpO2 goal > 92%    - Continue home medications: empagliflozin, lisinopril, baclofen,  clindamycin, urosidiol, hydroxychloroquine    - SBP 90-140, PRN Hydralazine and Labetalol    - ADAT, diabetic    - LISS for sugar coverage    - Bowel regimen PRN while taking narcotics    - Continue IVF's until taking good PO    - SCDs now, start SQ heparin tonight for DVT prophylaxis        Jerene Canny [facility]    Neurosurgery p4000.            **Electronically signed by Jerene Canny, PAC on 03/24/2020 12:48**        **Electronically signed by Jerene Canny, PAC on 03/24/2020 12:48**

## 2020-03-24 NOTE — Progress Notes (Signed)
 **Subjective**    Subjective Difficulty with pain control overnight. No acute events.    **Subjective**    Subjective Difficulty with pain control overnight. No acute events.    **Exam**    Comment VS: HR 107 BP 101/59 RR 23    GEN: Uncomfortable-appearing woman, laying on her side in bed. Awake and  interactive.    NEURO:    5/5 IP 5/5    5/5 Q 5/5    5/5 AT 5/5    5/5 G 5/5        Incisions clean, dry, intact.      Vital Signs    03/26/2020 22:51              -  Temperature: 37 (35-37.8Cel)          -  Site: Oral          -  Heart Rate: 100H (60-90)          -  Site: Monitor          -  BP: 106/65 (90-140/60-90)          -  Site: Left Arm          -  Position: Lying          -  Method: Automated          -  Mean Arterial Pressure1: 78 (60)          -  Respirations: 23H (12-20)          -  O2 Saturation (%): 96          -  O2 Delivery Method: Room Air          -  MEWS Vital Sign Score: 3      03/26/2020 14:07              -  Morse Fall Risk Total: 70      03/26/2020 08:31              -  Fingerstick Glucose: 98      CFS Vital Signs CS    03/26/2020 22:59              -  Shift Intake Total:          -  Shift Output Total:          -  Shift Balance: -        **Exam**    Comment VS: HR 107 BP 101/59 RR 23    GEN: Uncomfortable-appearing woman, laying on her side in bed. Awake and  interactive.    NEURO:    5/5 IP 5/5    5/5 Q 5/5    5/5 AT 5/5    5/5 G 5/5        Incisions clean, dry, intact.      Vital Signs    03/26/2020 22:51              -  Temperature: 37 (35-37.8Cel)          -  Site: Oral          -  Heart Rate: 100H (60-90)          -  Site: Monitor          -  BP: 106/65 (90-140/60-90)          -  Site: Left Arm          -  Position:  Lying          -  Method: Automated          -  Mean Arterial Pressure1: 78 (60)          -  Respirations: 23H (12-20)          -  O2 Saturation (%): 96          -  O2 Delivery Method: Room Air           -  MEWS Vital Sign Score: 3      03/26/2020 14:07              -  Morse Fall Risk Total: 70      03/26/2020 08:31              -  Fingerstick Glucose: 98      CFS Vital Signs CS    03/26/2020 22:59              -  Shift Intake Total:          -  Shift Output Total:          -  Shift Balance: -        **Assessment and Plan**    Medication              -   **OXYCODONE (PERCOLONE)** 5 MG Oral Q3HPRN Pain 4-6 for 5 Days           -   **OXYCODONE (PERCOLONE)** 10 MG Oral Q3HPRN Pain 7-10 for 5 Days           -   **POLYETHYLENE GLYCOL (MIRALAX)** 17 G Oral QDAY for 60 Days           -   **SENNA (SENOKOT)** 17.2 MG Oral QDAY for 60 Days           -   **EMPAGLIFLOZIN (JARDIANCE)** 10 MG Oral QDAY for 60 Days           -   **PANTOPRAZOLE DR (PROTONIX)** 40 MG Oral QDAY for 60 Days, Clinician Dir:IC FOR OMEPRAZOLE 40MG            -   **LISINOPRIL (PRINIVIL)** 2.5 MG Oral QDAY for 60 Days           -   **GABAPENTIN (NEURONTIN)** 300 MG Oral TID for 60 Days           -   **TRAZODONE (DESYREL)** 100 MG Oral QHS for 60 Days           -   **GABAPENTIN (NEURONTIN)** 300 MG Oral QHS for 60 Days           -   **BACLOFEN (LIORESAL)** 10 MG Oral BID for 60 Days           -   **HEPARIN** 5000 UNITS Subcutaneous Q8H for 60 Days           -   **FAMOTIDINE (PEPCID)** 20 MG Oral QHS for 60 Days           -   **URSODIOL (URSO)** 500 MG Oral BID for 60 Days           -   **NORMAL SALINE FLUSH 3 ML SYR (SALINE FLUSH)** 3 ML Intravenous Q8H for 60 Days, Clinician ZOX:WRUEA FOR PERIPHERAL LINE           -   ** ** ** **INSULIN LISPRO (HUMALOG) (HUMALOG)******** Sliding Scale Subcutaneous TIDWM for 60  Days      For Glucose 80 to 150 Give 0 Units    For Glucose 151 to 200 Give 2 Units    For Glucose 201 to 250 Give 4 Units    For Glucose 251 to 300 Give 6 Units    Notify Physician if: Call HO for glucose > 300          -   **LIDOCAINE 5% (LIDODERM 5%)** 1 PATCH Topical QDAYPRN Pain for  60 Days, Clinician Dir:12 HOURS ON, 12 HOURS OFF           -   **DEXTROSE 50% SYRINGE (DEXTROSE 50%)** 12.5 G Intravenous PRN BG < 60 and can not take PO for 60 Days           -   **ACETAMINOPHEN (TYLENOL)** 650 MG Oral Q4HPRN pain 1-3 or T > 38 C for 60 Days, Clinician Dir:NOT TO EXCEED 4 G OF APAP IN 24 HOURS           -   **NACL 0.9% (STOCK)** (1000 ML bag) Intravenous @80mL /Hour Over 12.5H for 60 Days, Clinician ZOX:[id number] WHEN TAKING ADEQUATE PO           -   **CYCLOBENZAPRINE (FLEXERIL)** 10 MG Oral Q8HPRN Spasms for 60 Days           -   **DIPHENHYDRAMINE (BENADRYL)** 25 MG Oral Q6HPRN Itching for 60 Days           -   **HYDRALAZINE (APRESOLINE)** 10 MG Intravenous Q4HPRN SBP 140-180 mmHg & HR <=70 bpm for 60 Days           -   **HYDROMORPHONE (DILAUDID)** 0.5 MG Intravenous Q4HPRN pain 4-6 for 7 Days, Clinician Dir:FOR PAIN NOT CONTROLLED BY PO MED           -   **HYDROMORPHONE (DILAUDID)** 1 MG Intravenous Q2HPRN pain 7-10 for 7 Days, Clinician Dir:FOR PAIN NOT CONTROLLED BY PO MED           -   **HYDRALAZINE (APRESOLINE)** 20 MG Intravenous Q4HPRN SBP > 180 mmHg & HR <= 70 bpm for 60 Days           -   **LABETALOL (TRANDATE)** 20 MG Intravenous Q4HPRN SBP > 180 mmHg and HR > 70 bpm for 60 Days           -   **LABETALOL (TRANDATE)** 10 MG Intravenous Q4HPRN SBP 140-180 mmHg & HR > 70 bpm for 60 Days           -   **DEXTROSE 50% SYRINGE (DEXTROSE 50%)** 12.5 G Intravenous PRN BG < 60 and can not take PO for 60 Days           -   **GLUCAGON** 1 MG Intravenous PRN BG < 60 and no IV access for 60 Days, Clinician Dir:MAY BE GIVEN IM OR SUB-Q           -   **ONDANSETRON (ZOFRAN)** 4 MG Intravenous Q6HPRN Nausea for 60 Days           -   **GLUCAGON** 1 MG Intravenous PRN BG < 60 and no IV access for 60 Days, Clinician Dir:MAY BE GIVEN IM OR SUB-Q           Neurosurgery Assessment and Plan Comment 59yoF s/p left L4-5 TLIF with  bilateral screws. Doing well post-operatively.         Plan:    - Pain control: Tylenol PRN, Flexeril 10mg  q8h,  Dilaudid 0.5-1mg  IV q2h PRN    --- Increase oxycodone 5-10 to q3prn    - PT/OT, OOB    - Rheum consult for advanced scleroderma, appreciate recs re: timing of  hydroxychloroquine/cellcept restart and management of scleroderma  manifestations    --- Reasonable to hold cellcept 1-2 weeks    --- Consider low dose prednisone (5mg ) if symptoms worsen    - Neuro checks q4h    - Continue hemodynamic monitoring    - Encourage IS    - Wean O2 as tolerated, SpO2 goal > 92%    - Continue home medications: empagliflozin, lisinopril, baclofen,  clindamycin, urosidiol    - SBP 90-14    --- PRN Hydralazine and Labetalol    - ADAT, diabetic    - LISS for sugar coverage    - Bowel regimen PRN while taking narcotics    - Continue IVF's until taking good PO    - SCDs and SQ heparin for DVT prophylaxis        Dispo: rehab        [facility]    Neurosurgery p4000.    **Assessment and Plan**    Medication              -   **OXYCODONE (PERCOLONE)** 5 MG Oral Q3HPRN Pain 4-6 for 5 Days           -   **OXYCODONE (PERCOLONE)** 10 MG Oral Q3HPRN Pain 7-10 for 5 Days           -   **POLYETHYLENE GLYCOL (MIRALAX)** 17 G Oral QDAY for 60 Days           -   **SENNA (SENOKOT)** 17.2 MG Oral QDAY for 60 Days           -   **EMPAGLIFLOZIN (JARDIANCE)** 10 MG Oral QDAY for 60 Days           -   **PANTOPRAZOLE DR (PROTONIX)** 40 MG Oral QDAY for 60 Days, Clinician Dir:IC FOR OMEPRAZOLE 40MG            -   **LISINOPRIL (PRINIVIL)** 2.5 MG Oral QDAY for 60 Days           -   **GABAPENTIN (NEURONTIN)** 300 MG Oral TID for 60 Days           -   **TRAZODONE (DESYREL)** 100 MG Oral QHS for 60 Days           -   **GABAPENTIN (NEURONTIN)** 300 MG Oral QHS for 60 Days           -   **BACLOFEN (LIORESAL)** 10 MG Oral BID for 60 Days           -   **HEPARIN** 5000 UNITS Subcutaneous Q8H for 60 Days           -   **FAMOTIDINE (PEPCID)** 20 MG Oral QHS for 60 Days           -    **URSODIOL (URSO)** 500 MG Oral BID for 60 Days           -   **NORMAL SALINE FLUSH 3 ML SYR (SALINE FLUSH)** 3 ML Intravenous Q8H for 60 Days, Clinician ZHY:QMVHQ FOR PERIPHERAL LINE           -   ** ** ** **INSULIN LISPRO (HUMALOG) (HUMALOG)******** Sliding Scale Subcutaneous TIDWM for 60 Days      For Glucose 80 to 150 Give 0 Units  For Glucose 151 to 200 Give 2 Units    For Glucose 201 to 250 Give 4 Units    For Glucose 251 to 300 Give 6 Units    Notify Physician if: Call HO for glucose > 300          -   **LIDOCAINE 5% (LIDODERM 5%)** 1 PATCH Topical QDAYPRN Pain for 60 Days, Clinician Dir:12 HOURS ON, 12 HOURS OFF           -   **DEXTROSE 50% SYRINGE (DEXTROSE 50%)** 12.5 G Intravenous PRN BG < 60 and can not take PO for 60 Days           -   **ACETAMINOPHEN (TYLENOL)** 650 MG Oral Q4HPRN pain 1-3 or T > 38 C for 60 Days, Clinician Dir:NOT TO EXCEED 4 G OF APAP IN 24 HOURS           -   **NACL 0.9% (STOCK)** (1000 ML bag) Intravenous @80mL /Hour Over 12.5H for 60 Days, Clinician ZOX:[id number] WHEN TAKING ADEQUATE PO           -   **CYCLOBENZAPRINE (FLEXERIL)** 10 MG Oral Q8HPRN Spasms for 60 Days           -   **DIPHENHYDRAMINE (BENADRYL)** 25 MG Oral Q6HPRN Itching for 60 Days           -   **HYDRALAZINE (APRESOLINE)** 10 MG Intravenous Q4HPRN SBP 140-180 mmHg & HR <=70 bpm for 60 Days           -   **HYDROMORPHONE (DILAUDID)** 0.5 MG Intravenous Q4HPRN pain 4-6 for 7 Days, Clinician Dir:FOR PAIN NOT CONTROLLED BY PO MED           -   **HYDROMORPHONE (DILAUDID)** 1 MG Intravenous Q2HPRN pain 7-10 for 7 Days, Clinician Dir:FOR PAIN NOT CONTROLLED BY PO MED           -   **HYDRALAZINE (APRESOLINE)** 20 MG Intravenous Q4HPRN SBP > 180 mmHg & HR <= 70 bpm for 60 Days           -   **LABETALOL (TRANDATE)** 20 MG Intravenous Q4HPRN SBP > 180 mmHg and HR > 70 bpm for 60 Days           -   **LABETALOL (TRANDATE)** 10 MG Intravenous Q4HPRN SBP 140-180 mmHg & HR > 70 bpm for 60 Days            -   **DEXTROSE 50% SYRINGE (DEXTROSE 50%)** 12.5 G Intravenous PRN BG < 60 and can not take PO for 60 Days           -   **GLUCAGON** 1 MG Intravenous PRN BG < 60 and no IV access for 60 Days, Clinician Dir:MAY BE GIVEN IM OR SUB-Q           -   **ONDANSETRON (ZOFRAN)** 4 MG Intravenous Q6HPRN Nausea for 60 Days           -   **GLUCAGON** 1 MG Intravenous PRN BG < 60 and no IV access for 60 Days, Clinician Dir:MAY BE GIVEN IM OR SUB-Q           Neurosurgery Assessment and Plan Comment 59yoF s/p left L4-5 TLIF with  bilateral screws. Doing well post-operatively.        Plan:    - Pain control: Tylenol PRN, Flexeril 10mg  q8h, Dilaudid 0.5-1mg  IV q2h PRN    --- Increase oxycodone 5-10 to q3prn    -  PT/OT, OOB    - Rheum consult for advanced scleroderma, appreciate recs re: timing of  hydroxychloroquine/cellcept restart and management of scleroderma  manifestations    --- Reasonable to hold cellcept 1-2 weeks    --- Consider low dose prednisone (5mg ) if symptoms worsen    - Neuro checks q4h    - Continue hemodynamic monitoring    - Encourage IS    - Wean O2 as tolerated, SpO2 goal > 92%    - Continue home medications: empagliflozin, lisinopril, baclofen,  clindamycin, urosidiol    - SBP 90-14    --- PRN Hydralazine and Labetalol    - ADAT, diabetic    - LISS for sugar coverage    - Bowel regimen PRN while taking narcotics    - Continue IVF's until taking good PO    - SCDs and SQ heparin for DVT prophylaxis        Dispo: rehab        [facility]    Neurosurgery p4000.            **Electronically signed by Carilyn Goodpasture, MD on 03/26/2020 23:22**        **Electronically signed by Carilyn Goodpasture, MD on 03/26/2020 23:22**

## 2020-03-24 NOTE — Consults (Signed)
 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  Rheumatology has been consulted for management of scleroderma post-op.        The patient has been on cellcept since at least 07/2019, plaquenil for years,  and was on rituximab for inflammatory arthritis reportedly years ago. The  patient has not had heart or lung involvement of her scleroderma.        She was just seen by her primary rheumatologist at [facility] on 12/10 and given a 5  day course of 10mg  prednisone qdaily for synovitis seen in the MCPs/PIPs. The  patient reports this really helped her pains and swelling however this  returned after the course was completed. She was also started on low-dose  oxycodone about 1 month ago. Her last IVIG infusion was this week, with her  next one planned after her rehab.        **Objective**    VS reviewed; on my exam patient had simple mask on for O2 supplementation.    Gen: Initially asleep and in NAD; arousable to voice and answering questions  appropriately    HEENT: NCAT    CV: RRR    Pulm: B/l CTA    Abd: Soft, NT, ND    Neuro: No focal deficits    Skin: Sclerodactyly appreciated in distal fingers (namely between PIPs and  PIPs).    MSK: Right > left MCP synovitis, with swelling and pain on palpation.  Bilateral PIP synovitis also appreciated. Unable to make a fist bilaterally.  No other joints with synovitis or ROM deficits.        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  Rheumatology has been consulted for management of scleroderma post-op. It's  clear she has synovitis and puffy fingers / mild sclerodactyly in the  bilateral hands. Since she was seen after her surgery today, she reported her  incisional pains were worse than her synovitis. We will keep the following  plan:        -Outpatient rheumatologist spoken to. Further immunosuppression changes can occur in the outpatient setting.      -Pt has clear synovitis in the hands / puffy fingers and mild sclerodactyly; if this got bad enough in the next few days, could consider low dose prednisone (maybe 5mg  since 10mg  worked so well for her in the last few weeks), however that's if the primary is OK with it given need for wound/incision healing and also only if the patient felt especially symptomatic.         Thank you for  allowing [place] to participate in this patient's care.        Patient discussed and examined with attending Rheumatologist, Dr. Leonette Most.          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  Rheumatology has been consulted for management of scleroderma post-op.        The patient has been on cellcept since at least 07/2019, plaquenil for years,  and was on rituximab for inflammatory arthritis reportedly years  ago. The  patient has not had heart or lung involvement of her scleroderma.        She was just seen by her primary rheumatologist at [facility] on 12/10 and given a 5  day course of 10mg  prednisone qdaily for synovitis seen in the MCPs/PIPs. The  patient reports this really helped her pains and swelling however this  returned after the course was completed. She was also started on low-dose  oxycodone about 1 month ago. Her last IVIG infusion was this week, with her  next one planned after her rehab.        **Objective**    VS reviewed; on my exam patient had simple mask on for O2 supplementation.    Gen: Initially asleep and in NAD; arousable to voice and answering questions  appropriately    HEENT: NCAT    CV: RRR    Pulm: B/l CTA    Abd: Soft, NT, ND    Neuro: No focal deficits    Skin: Sclerodactyly appreciated in distal fingers (namely between PIPs and  PIPs).    MSK: Right > left MCP synovitis, with swelling and pain on palpation.  Bilateral PIP synovitis also appreciated. Unable to make a fist bilaterally.  No other joints with synovitis or ROM deficits.        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  Rheumatology has been consulted for management of scleroderma post-op. It's  clear she has synovitis and puffy fingers / mild sclerodactyly in the  bilateral hands. Since she was seen after her surgery today, she reported her  incisional pains were worse than her synovitis.  We will keep the following  plan:        -Outpatient rheumatologist spoken to. Further immunosuppression changes can occur in the outpatient setting.      -Pt has clear synovitis in the hands / puffy fingers and mild sclerodactyly; if this got bad enough in the next few days, could consider low dose prednisone (maybe 5mg  since 10mg  worked so well for her in the last few weeks),  however that's if the primary is OK with it given need for wound/incision healing and also only if the patient felt especially symptomatic.         Thank you for allowing [place] to participate in this patient's care.        Patient discussed and examined with attending Rheumatologist, Dr. Leonette Most.                  **Electronically signed by Monia Pouch, MD on 03/24/2020 17:34**        **Electronically signed by Monia Pouch, MD on 03/24/2020 17:34**

## 2020-03-24 NOTE — Progress Notes (Signed)
 **Neurosurgery**    Procedure 03/24/20: left L4-5 TLIF with bilateral screws.    **Subjective**    Subjective Complained of pain to right thigh ?quadriceps spasm while walking  overnight but overall feels "much better"    States the gabapentin helps.    **Exam**    Comment VS: HR 102, SpO2 96% room air    GEN: Pleasant woman, laying in bed in no acute distress. Conversant, appears  comfortable    NEURO:    5/5 bilateral IP, quad, AT/gastroc    Incisions clean, dry, intact with steri strips    Skin warm and dry.      Vital Signs    03/30/2020 07:24              -  Temperature: 36.5 (35-37.8Cel)          -  Site: Oral          -  Heart Rate: 94H (60-90)          -  Site: Monitor          -  BP: 130/87 (90-140/60-90)          -  Site: Right Arm          -  Position: Sitting          -  Method: Automated          -  Mean Arterial Pressure1: 100 (60)          -  Respirations: 12 (12-20)          -  O2 Saturation (%): 97          -  O2 Delivery Method: Room Air          -  MEWS Vital Sign Score: 1      03/29/2020 20:00              -  Morse Fall Risk Total: 40      CFS Vital Signs CS    03/30/2020 06:59              -  Shift Intake Total: 0ml          -  Shift Output Total:          -  Shift Balance: -        **Assessment and Plan**    Medication              -   **ACETAMINOPHEN (TYLENOL)** 975 MG Oral Q6H First Dose Now for 60 Days, Clinician Dir:NOT TO EXCEED 4G           -   **HYDROXYCHLOROQUINE (PLAQUENIL)** 200 MG Oral QDAY for 60 Days           -   **OXYCODONE (PERCOLONE)** 10 MG Oral Q3HPRN Pain 7-10 for 5 Days           -   **OXYCODONE (PERCOLONE)** 5 MG Oral Q3HPRN Pain 4-6 for 5 Days           -   **POLYETHYLENE GLYCOL (MIRALAX)** 17 G Oral QDAY for 60 Days           -   **SENNA (SENOKOT)** 17.2 MG Oral QDAY for 60 Days           -   **PANTOPRAZOLE DR (PROTONIX)** 40 MG Oral QDAY for 60 Days, Clinician Dir:IC FOR OMEPRAZOLE 40MG            -    **  EMPAGLIFLOZIN (JARDIANCE)** 10 MG Oral QDAY for 60 Days           -   **LISINOPRIL (PRINIVIL)** 2.5 MG Oral QDAY for 60 Days           -   **GABAPENTIN (NEURONTIN)** 300 MG Oral TID for 60 Days           -   **FAMOTIDINE (PEPCID)** 20 MG Oral QHS for 60 Days           -   **HEPARIN** 5000 UNITS Subcutaneous Q8H for 60 Days           -   **URSODIOL (URSO)** 500 MG Oral BID for 60 Days           -   **GABAPENTIN (NEURONTIN)** 300 MG Oral QHS for 60 Days           -   **BACLOFEN (LIORESAL)** 10 MG Oral BID for 60 Days           -   **TRAZODONE (DESYREL)** 100 MG Oral QHS for 60 Days           -   **NORMAL SALINE FLUSH 3 ML SYR (SALINE FLUSH)** 3 ML Intravenous Q8H for 60 Days, Clinician ZOX:WRUEA FOR PERIPHERAL LINE           -   ** ** ** **INSULIN LISPRO (HUMALOG) (HUMALOG)******** Sliding Scale Subcutaneous TIDWM for 60 Days      For Glucose 80 to 150 Give 0 Units    For Glucose 151 to 200 Give 2 Units    For Glucose 201 to 250 Give 4 Units    For Glucose 251 to 300 Give 6 Units    Notify Physician if: Call HO for glucose > 300          -   **LIDOCAINE 5% (LIDODERM 5%)** 1 PATCH Topical QDAYPRN Pain for 60 Days, Clinician Dir:12 HOURS ON, 12 HOURS OFF           -   **LABETALOL (TRANDATE)** 20 MG Intravenous Q4HPRN SBP > 180 mmHg and HR > 70 bpm for 60 Days           -   **GLUCAGON** 1 MG Intravenous PRN BG < 60 and no IV access for 60 Days, Clinician Dir:MAY BE GIVEN IM OR SUB-Q           -   **DEXTROSE 50% SYRINGE (DEXTROSE 50%)** 12.5 G Intravenous PRN BG < 60 and can not take PO for 60 Days           -   **DEXTROSE 50% SYRINGE (DEXTROSE 50%)** 12.5 G Intravenous PRN BG < 60 and can not take PO for 60 Days           -   **CYCLOBENZAPRINE (FLEXERIL)** 10 MG Oral Q8HPRN Spasms for 60 Days           -   **DIPHENHYDRAMINE (BENADRYL)** 25 MG Oral Q6HPRN Itching for 60 Days           -   **HYDRALAZINE (APRESOLINE)** 10 MG Intravenous Q4HPRN SBP 140-180 mmHg & HR <=70 bpm for 60 Days            -   **ACETAMINOPHEN (TYLENOL)** 650 MG Oral Q4HPRN pain 1-3 or T > 38 C for 60 Days, Clinician Dir:NOT TO EXCEED 4 G OF APAP IN 24 HOURS           -   **NACL 0.9% (STOCK)** (1000 ML bag)  Intravenous @80mL /Hour Over 12.5H for 60 Days, Clinician AVW:[id number] WHEN TAKING ADEQUATE PO           -   **LABETALOL (TRANDATE)** 10 MG Intravenous Q4HPRN SBP 140-180 mmHg & HR > 70 bpm for 60 Days           -   **HYDRALAZINE (APRESOLINE)** 20 MG Intravenous Q4HPRN SBP > 180 mmHg & HR <= 70 bpm for 60 Days           -   **ONDANSETRON (ZOFRAN)** 4 MG Intravenous Q6HPRN Nausea for 60 Days           -   **GLUCAGON** 1 MG Intravenous PRN BG < 60 and no IV access for 60 Days, Clinician Dir:MAY BE GIVEN IM OR SUB-Q           Neurosurgery Assessment and Plan Comment 59yoF s/p left L4-5 TLIF with  bilateral screws. Doing well post-operatively.        Since patient notes positive effect with gabapentin, will increase nightly  dose from 300mg  --> 600mg . Continue TID and qHS dosing        Plan:    - Pain control: Tylenol PRN, Flexeril 10mg  q8h, Gabapentin 300mg  tid and  600mg  qHS and oxycodone 5-10 q3prn    - PT/OT    - OOB    - Rheum consult for advanced scleroderma, appreciate recs re: timing of  hydroxychloroquine/cellcept restart and management of scleroderma  manifestations    --- Reasonable to hold cellcept 1 week    --- Continue home hydroxychloroquine    --- Consider low dose prednisone (5mg ) if symptoms worsen    --- IVIG infusion was scheduled - patient can get at rehab or home, patient  will need to follow up.    - Neuro checks q4h    - Continue hemodynamic monitoring    - Encourage IS    - Tolerating RA, continue to maintain SpO2 goal > 92%    - Continue home medications: empagliflozin, lisinopril, baclofen, urosidiol,  hydroxychloroquine    - SBP 90-140    --- PRN Hydralazine and Labetalol    - Diabetic diet    - LISS for sugar coverage    - Bowel regimen PRN while taking narcotics    - SCDs and SQ  heparin for DVT prophylaxis        Appropriate for discharge to acute rehab when bed is available        Adrienne Mocha, PA-C    Neurosurgery p4000.            **Electronically signed by Lenda Kelp on 03/30/2020 08:34**

## 2020-03-24 NOTE — Progress Notes (Signed)
 **Neurosurgery**    Neurosurgery Post Operative POD # 1.    Procedure 03/24/20: left L4-5 TLIF with bilateral screws.    **Subjective**    Subjective Difficulty with pain control overnight.    **Exam**    Comment VSS    Gen: Lying in bed comfortably, follows commands.    Mental status: Awake, alert, attentive.    CN: Face grossly symmetric, Hearing grossly intact    Respiratory: Breathing comfortably, no accessory muscle use    Cardiac: Normal sinus rhythm on telemetry    Strength:    Full 5/5 strength in bilateral IP, AT, EHL, G    Sensation intact to light touch throughout upper and lower extremities    Dressing: C/D/I    GU: no foley noted.      Vital Signs    03/25/2020 08:25              -  Temperature: 37.8 (35-37.8Cel)          -  Site: Oral          -  Heart Rate: 88 (60-90)          -  Site: Monitor          -  BP: 110/70 (90-140/60-90)          -  Site: Left Arm          -  Position: Lying          -  Method: Automated          -  Respirations: 17 (12-20)          -  O2 Saturation (%): 93          -  O2 Delivery Method: Room Air          -  Fingerstick Glucose: 191          -  MEWS Vital Sign Score: 1      03/25/2020 00:05              -  Mean Arterial Pressure1: 97 (60)      03/24/2020 20:00              -  Morse Fall Risk Total: 60      03/24/2020 17:58              -  How Obtained: Stated Weight          -  Weight: 67kg      CFS Vital Signs CS    03/25/2020 14:59              -  Shift Intake Total:          -  Shift Output Total: 0ml          -  Shift Balance:        **Assessment and Plan**    Medication              -   **SENNA (SENOKOT)** 17.2 MG Oral QDAY for 60 Days           -   **EMPAGLIFLOZIN (JARDIANCE)** 10 MG Oral QDAY for 60 Days           -   **POLYETHYLENE GLYCOL (MIRALAX)** 17 G Oral QDAY for 60 Days           -   **LISINOPRIL (PRINIVIL)** 2.5 MG Oral QDAY for 60 Days           -   **  PANTOPRAZOLE DR (PROTONIX)** 40 MG Oral QDAY  for 60 Days, Clinician Dir:IC FOR OMEPRAZOLE 40MG            -   **GABAPENTIN (NEURONTIN)** 300 MG Oral TID for 60 Days           -   **URSODIOL (URSO)** 500 MG Oral BID for 60 Days           -   **GABAPENTIN (NEURONTIN)** 300 MG Oral QHS for 60 Days           -   **BACLOFEN (LIORESAL)** 10 MG Oral BID for 60 Days           -   **FAMOTIDINE (PEPCID)** 20 MG Oral QHS for 60 Days           -   **TRAZODONE (DESYREL)** 100 MG Oral QHS for 60 Days           -   **HEPARIN** 5000 UNITS Subcutaneous Q8H for 60 Days           -   **NORMAL SALINE FLUSH 3 ML SYR (SALINE FLUSH)** 3 ML Intravenous Q8H for 60 Days, Clinician ZOX:WRUEA FOR PERIPHERAL LINE           -   ** ** ** **INSULIN LISPRO (HUMALOG) (HUMALOG)******** Sliding Scale Subcutaneous TIDWM for 60 Days      For Glucose 80 to 150 Give 0 Units    For Glucose 151 to 200 Give 2 Units    For Glucose 201 to 250 Give 4 Units    For Glucose 251 to 300 Give 6 Units    Notify Physician if: Call HO for glucose > 300          -   **LIDOCAINE 5% (LIDODERM 5%)** 1 PATCH Topical QDAYPRN Pain for 60 Days, Clinician Dir:12 HOURS ON, 12 HOURS OFF           -   **CYCLOBENZAPRINE (FLEXERIL)** 10 MG Oral Q8HPRN Spasms for 60 Days           -   **ACETAMINOPHEN (TYLENOL)** 650 MG Oral Q4HPRN pain 1-3 or T > 38 C for 60 Days, Clinician Dir:NOT TO EXCEED 4 G OF APAP IN 24 HOURS           -   **DIPHENHYDRAMINE (BENADRYL)** 25 MG Oral Q6HPRN Itching for 60 Days           -   **HYDRALAZINE (APRESOLINE)** 10 MG Intravenous Q4HPRN SBP 140-180 mmHg & HR <=70 bpm for 60 Days           -   **GLUCAGON** 1 MG Intravenous PRN BG < 60 and no IV access for 60 Days, Clinician Dir:MAY BE GIVEN IM OR SUB-Q           -   **HYDROMORPHONE (DILAUDID)** 0.5 MG Intravenous Q4HPRN pain 4-6 for 7 Days, Clinician Dir:FOR PAIN NOT CONTROLLED BY PO MED           -   **NACL 0.9% (STOCK)** (1000 ML bag) Intravenous @80mL /Hour Over 12.5H for 60 Days, Clinician VWU:[id number] WHEN TAKING  ADEQUATE PO           -   **HYDROMORPHONE (DILAUDID)** 1 MG Intravenous Q2HPRN pain 7-10 for 7 Days, Clinician Dir:FOR PAIN NOT CONTROLLED BY PO MED           -   **HYDRALAZINE (APRESOLINE)** 20 MG Intravenous Q4HPRN SBP > 180 mmHg & HR <= 70 bpm for 60 Days           -   **  LABETALOL (TRANDATE)** 20 MG Intravenous Q4HPRN SBP > 180 mmHg and HR > 70 bpm for 60 Days           -   **LABETALOL (TRANDATE)** 10 MG Intravenous Q4HPRN SBP 140-180 mmHg & HR > 70 bpm for 60 Days           -   **DEXTROSE 50% SYRINGE (DEXTROSE 50%)** 12.5 G Intravenous PRN BG < 60 and can not take PO for 60 Days           -   **OXYCODONE (PERCOLONE)** 10 MG Oral Q4HPRN Pain 7-10 for 7 Days           -   **GLUCAGON** 1 MG Intravenous PRN BG < 60 and no IV access for 60 Days, Clinician Dir:MAY BE GIVEN IM OR SUB-Q           -   **ONDANSETRON (ZOFRAN)** 4 MG Intravenous Q6HPRN Nausea for 60 Days           -   **DEXTROSE 50% SYRINGE (DEXTROSE 50%)** 12.5 G Intravenous PRN BG < 60 and can not take PO for 60 Days           -   **OXYCODONE (PERCOLONE)** 5 MG Oral Q4HPRN Pain 4-6 for 7 Days           Neurosurgery Assessment and Plan Comment 59yoF s/p left L4-5 TLIF with  bilateral screws. Doing well post-operatively.        Plan:    - Pain control: Tylenol PRN, Flexeril 10mg  q8h, Oxycodone 5-10mg  q4h PRN,  Dilaudid 0.5-1mg  IV q2h PRN    - PT/OT, OOB    - Rheum consult for advanced scleroderma, appreciate recs re: timing of  hydroxychloroquine/cellcept restart and management of scleroderma  manifestations    - Neuro checks q4h    - Continue hemodynamic monitoring    - Encourage IS    - Wean O2 as tolerated, SpO2 goal > 92%    - Continue home medications: empagliflozin, lisinopril, baclofen,  clindamycin, urosidiol    --- holding hydroxychloroquine, cellcept as above    - SBP 90-140, PRN Hydralazine and Labetalol    - ADAT, diabetic    - LISS for sugar coverage    - Bowel regimen PRN while taking narcotics    - Continue IVF's until  taking good PO    - SCDs now, start SQ heparin tonight for DVT prophylaxis        Otho Darner, MD    Neurosurgery p4000.            **Electronically signed by Francis Dowse, MD on 03/25/2020 09:47**

## 2020-03-24 NOTE — Progress Notes (Signed)
 **Neurosurgery**    Neurosurgery Post Operative POD # 5.    Procedure 03/24/20: left L4-5 TLIF with bilateral screws.    **Neurosurgery**    Neurosurgery Post Operative POD # 5.    Procedure 03/24/20: left L4-5 TLIF with bilateral screws.    **Neurosurgery**    Neurosurgery Post Operative POD # 5.    Procedure 03/24/20: left L4-5 TLIF with bilateral screws.    **Subjective**    Subjective Patient is still having difficulty with pain control. Reports she  was crying all day yesterday due to pain. Overnight, she endorsed improved  pain control from getting pain meds on time. .    **Subjective**    Subjective Patient is still having difficulty with pain control. Reports she  was crying all day yesterday due to pain. Overnight, she endorsed improved  pain control from getting pain meds on time. .    **Subjective**    Subjective Patient is still having difficulty with pain control. Reports she  was crying all day yesterday due to pain. Overnight, she endorsed improved  pain control from getting pain meds on time. .    **Exam**    Comment VS: Afebrile, HR 89 BP 117/77 RR 20 SpO2 98% on RA    GEN: Pleasant woman, laying in bed in no acute distress. Became teary eyed  when talked about pain. No visible difficulty with breathing.    NEURO:    5/5 IP 5/5    5/5 Q 5/5    5/5 AT 5/5    5/5 G 5/5        Incisions clean, dry, intact    No foley present.      Vital Signs    03/29/2020 04:30              -  Temperature: 36.5 (35-37.8Cel)          -  Site: Oral          -  Heart Rate: 89 (60-90)          -  Site: Monitor          -  BP: 117/77 (90-140/60-90)          -  Site: Right Arm          -  Position: Lying          -  Method: Automated          -  Respirations: 20 (12-20)          -  O2 Saturation (%): 98          -  O2 Delivery Method: Room Air          -  MEWS Vital Sign Score: 0      03/28/2020 23:10              -  Mean Arterial Pressure1: 96 (60)      03/28/2020 22:37              -  Morse  Fall Risk Total: 60      03/28/2020 07:36              -  Fingerstick Glucose: 104      CFS Vital Signs CS    03/29/2020 06:59              -  Shift Intake Total:          -  Shift Output Total:          -  Shift Balance:        **Exam**    Comment VS: Afebrile, HR 89 BP 117/77 RR 20 SpO2 98% on RA    GEN: Pleasant woman, laying in bed in no acute distress. Became teary eyed  when talked about pain. No visible difficulty with breathing.    NEURO:    5/5 IP 5/5    5/5 Q 5/5    5/5 AT 5/5    5/5 G 5/5        Incisions clean, dry, intact    No foley present.      Vital Signs    03/29/2020 04:30              -  Temperature: 36.5 (35-37.8Cel)          -  Site: Oral          -  Heart Rate: 89 (60-90)          -  Site: Monitor          -  BP: 117/77 (90-140/60-90)          -  Site: Right Arm          -  Position: Lying          -  Method: Automated          -  Respirations: 20 (12-20)          -  O2 Saturation (%): 98          -  O2 Delivery Method: Room Air          -  MEWS Vital Sign Score: 0      03/28/2020 23:10              -  Mean Arterial Pressure1: 96 (60)      03/28/2020 22:37              -  Morse Fall Risk Total: 60      03/28/2020 07:36              -  Fingerstick Glucose: 104      CFS Vital Signs CS    03/29/2020 06:59              -  Shift Intake Total:          -  Shift Output Total:          -  Shift Balance:        **Exam**    Comment VS: Afebrile, HR 89 BP 117/77 RR 20 SpO2 98% on RA    GEN: Pleasant woman, laying in bed in no acute distress. Became teary eyed  when talked about pain. No visible difficulty with breathing.    NEURO:    5/5 IP 5/5    5/5 Q 5/5    5/5 AT 5/5    5/5 G 5/5        Incisions clean, dry, intact    No foley present.      Vital Signs    03/29/2020 04:30              -  Temperature: 36.5 (35-37.8Cel)          -  Site: Oral          -  Heart Rate: 89 (60-90)          -  Site: Monitor           -  BP: 117/77 (90-140/60-90)          -  Site: Right Arm          -  Position: Lying          -  Method: Automated          -  Respirations: 20 (12-20)          -  O2 Saturation (%): 98          -  O2 Delivery Method: Room Air          -  MEWS Vital Sign Score: 0      03/28/2020 23:10              -  Mean Arterial Pressure1: 96 (60)      03/28/2020 22:37              -  Morse Fall Risk Total: 60      03/28/2020 07:36              -  Fingerstick Glucose: 104      CFS Vital Signs CS    03/29/2020 06:59              -  Shift Intake Total:          -  Shift Output Total:          -  Shift Balance:        **Assessment and Plan**    Medication              -   **HYDROXYCHLOROQUINE (PLAQUENIL)** 200 MG Oral QDAY for 60 Days           -   **OXYCODONE (PERCOLONE)** 5 MG Oral Q3HPRN Pain 4-6 for 5 Days           -   **OXYCODONE (PERCOLONE)** 10 MG Oral Q3HPRN Pain 7-10 for 5 Days           -   **SENNA (SENOKOT)** 17.2 MG Oral QDAY for 60 Days           -   **LISINOPRIL (PRINIVIL)** 2.5 MG Oral QDAY for 60 Days           -   **PANTOPRAZOLE DR (PROTONIX)** 40 MG Oral QDAY for 60 Days, Clinician Dir:IC FOR OMEPRAZOLE 40MG            -   **EMPAGLIFLOZIN (JARDIANCE)** 10 MG Oral QDAY for 60 Days           -   **POLYETHYLENE GLYCOL (MIRALAX)** 17 G Oral QDAY for 60 Days           -   **GABAPENTIN (NEURONTIN)** 300 MG Oral TID for 60 Days           -   **HEPARIN** 5000 UNITS Subcutaneous Q8H for 60 Days           -   **FAMOTIDINE (PEPCID)** 20 MG Oral QHS for 60 Days           -   **GABAPENTIN (NEURONTIN)** 300 MG Oral QHS for 60 Days           -   **TRAZODONE (DESYREL)** 100 MG Oral QHS for 60 Days           -   **URSODIOL (URSO)** 500 MG Oral BID for 60 Days           -   **BACLOFEN (LIORESAL)** 10 MG Oral BID for  60 Days           -   **NORMAL SALINE FLUSH 3 ML SYR (SALINE FLUSH)** 3 ML Intravenous Q8H for 60 Days, Clinician WFU:[id number] FOR PERIPHERAL  LINE           -   ** ** ** **INSULIN LISPRO (HUMALOG) (HUMALOG)******** Sliding Scale Subcutaneous TIDWM for 60 Days      For Glucose 80 to 150 Give 0 Units    For Glucose 151 to 200 Give 2 Units    For Glucose 201 to 250 Give 4 Units    For Glucose 251 to 300 Give 6 Units    Notify Physician if: Call HO for glucose > 300          -   **LIDOCAINE 5% (LIDODERM 5%)** 1 PATCH Topical QDAYPRN Pain for 60 Days, Clinician Dir:12 HOURS ON, 12 HOURS OFF           -   **GLUCAGON** 1 MG Intravenous PRN BG < 60 and no IV access for 60 Days, Clinician Dir:MAY BE GIVEN IM OR SUB-Q           -   **HYDRALAZINE (APRESOLINE)** 10 MG Intravenous Q4HPRN SBP 140-180 mmHg & HR <=70 bpm for 60 Days           -   **NACL 0.9% (STOCK)** (1000 ML bag) Intravenous @80mL /Hour Over 12.5H for 60 Days, Clinician TDD:[id number] WHEN TAKING ADEQUATE PO           -   **DEXTROSE 50% SYRINGE (DEXTROSE 50%)** 12.5 G Intravenous PRN BG < 60 and can not take PO for 60 Days           -   **HYDRALAZINE (APRESOLINE)** 20 MG Intravenous Q4HPRN SBP > 180 mmHg & HR <= 70 bpm for 60 Days           -   **LABETALOL (TRANDATE)** 20 MG Intravenous Q4HPRN SBP > 180 mmHg and HR > 70 bpm for 60 Days           -   **LABETALOL (TRANDATE)** 10 MG Intravenous Q4HPRN SBP 140-180 mmHg & HR > 70 bpm for 60 Days           -   **HYDROMORPHONE (DILAUDID)** 0.5 MG Intravenous Q4HPRN pain 4-6 for 7 Days, Clinician Dir:FOR PAIN NOT CONTROLLED BY PO MED           -   **ACETAMINOPHEN (TYLENOL)** 650 MG Oral Q4HPRN pain 1-3 or T > 38 C for 60 Days, Clinician Dir:NOT TO EXCEED 4 G OF APAP IN 24 HOURS           -   **ONDANSETRON (ZOFRAN)** 4 MG Intravenous Q6HPRN Nausea for 60 Days           -   **CYCLOBENZAPRINE (FLEXERIL)** 10 MG Oral Q8HPRN Spasms for 60 Days           -   **DIPHENHYDRAMINE (BENADRYL)** 25 MG Oral Q6HPRN Itching for 60 Days           -   **HYDROMORPHONE (DILAUDID)** 1 MG Intravenous Q2HPRN pain 7-10 for 7 Days, Clinician Dir:FOR PAIN  NOT CONTROLLED BY PO MED           -   **GLUCAGON** 1 MG Intravenous PRN BG < 60 and no IV access for 60 Days, Clinician Dir:MAY BE GIVEN IM OR SUB-Q           -   **DEXTROSE 50% SYRINGE (DEXTROSE 50%)**  12.5 G Intravenous PRN BG < 60 and can not take PO for 60 Days           Neurosurgery Assessment and Plan Comment 59yoF s/p left L4-5 TLIF with  bilateral screws. Doing well post-operatively.        Plan:    - Pain control: Tylenol PRN, Flexeril 10mg  q8h, Gabapentin 300mg  and  oxycodone 5-10 q3prn    - PT/OT    - OOB    - Rheum consult for advanced scleroderma, appreciate recs re: timing of  hydroxychloroquine/cellcept restart and management of scleroderma  manifestations    --- Reasonable to hold cellcept 1 week    --- Continue home hydroxychloroquine    --- Consider low dose prednisone (5mg ) if symptoms worsen    --- IVIG infusion was scheduled - patient can get at rehab or home, patient  will need to follow up.    - Neuro checks q4h    - Continue hemodynamic monitoring    - Encourage IS    - Tolerating RA, continue to maintain SpO2 goal > 92%    - Continue home medications: empagliflozin, lisinopril, baclofen, urosidiol,  hydroxychloroquine    - SBP 90-140    --- PRN Hydralazine and Labetalol    - Diabetic diet    - LISS for sugar coverage    - Bowel regimen PRN while taking narcotics    - SCDs and SQ heparin for DVT prophylaxis        Appropriate for discharge to acute rehab when bed is available        [name]    Neurosurgery p4000.    **Assessment and Plan**    Medication              -   **HYDROXYCHLOROQUINE (PLAQUENIL)** 200 MG Oral QDAY for 60 Days           -   **OXYCODONE (PERCOLONE)** 5 MG Oral Q3HPRN Pain 4-6 for 5 Days           -   **OXYCODONE (PERCOLONE)** 10 MG Oral Q3HPRN Pain 7-10 for 5 Days           -   **SENNA (SENOKOT)** 17.2 MG Oral QDAY for 60 Days           -   **LISINOPRIL (PRINIVIL)** 2.5 MG Oral QDAY for 60 Days           -   **PANTOPRAZOLE DR (PROTONIX)** 40 MG  Oral QDAY for 60 Days, Clinician Dir:IC FOR OMEPRAZOLE 40MG            -   **EMPAGLIFLOZIN (JARDIANCE)** 10 MG Oral QDAY for 60 Days           -   **POLYETHYLENE GLYCOL (MIRALAX)** 17 G Oral QDAY for 60 Days           -   **GABAPENTIN (NEURONTIN)** 300 MG Oral TID for 60 Days           -   **HEPARIN** 5000 UNITS Subcutaneous Q8H for 60 Days           -   **FAMOTIDINE (PEPCID)** 20 MG Oral QHS for 60 Days           -   **GABAPENTIN (NEURONTIN)** 300 MG Oral QHS for 60 Days           -   **TRAZODONE (DESYREL)** 100 MG Oral QHS for 60 Days           -   **  URSODIOL (URSO)** 500 MG Oral BID for 60 Days           -   **BACLOFEN (LIORESAL)** 10 MG Oral BID for 60 Days           -   **NORMAL SALINE FLUSH 3 ML SYR (SALINE FLUSH)** 3 ML Intravenous Q8H for 60 Days, Clinician [id number] FOR PERIPHERAL LINE           -   ** ** ** **INSULIN LISPRO (HUMALOG) (HUMALOG)******** Sliding Scale Subcutaneous TIDWM for 60 Days      For Glucose 80 to 150 Give 0 Units    For Glucose 151 to 200 Give 2 Units    For Glucose 201 to 250 Give 4 Units    For Glucose 251 to 300 Give 6 Units    Notify Physician if: Call HO for glucose > 300          -   **LIDOCAINE 5% (LIDODERM 5%)** 1 PATCH Topical QDAYPRN Pain for 60 Days, Clinician Dir:12 HOURS ON, 12 HOURS OFF           -   **GLUCAGON** 1 MG Intravenous PRN BG < 60 and no IV access for 60 Days, Clinician Dir:MAY BE GIVEN IM OR SUB-Q           -   **HYDRALAZINE (APRESOLINE)** 10 MG Intravenous Q4HPRN SBP 140-180 mmHg & HR <=70 bpm for 60 Days           -   **NACL 0.9% (STOCK)** (1000 ML bag) Intravenous @80mL /Hour Over 12.5H for 60 Days, Clinician NGE:[id number] WHEN TAKING ADEQUATE PO           -   **DEXTROSE 50% SYRINGE (DEXTROSE 50%)** 12.5 G Intravenous PRN BG < 60 and can not take PO for 60 Days           -   **HYDRALAZINE (APRESOLINE)** 20 MG Intravenous Q4HPRN SBP > 180 mmHg & HR <= 70 bpm for 60 Days           -   **LABETALOL (TRANDATE)** 20 MG  Intravenous Q4HPRN SBP > 180 mmHg and HR > 70 bpm for 60 Days           -   **LABETALOL (TRANDATE)** 10 MG Intravenous Q4HPRN SBP 140-180 mmHg & HR > 70 bpm for 60 Days           -   **HYDROMORPHONE (DILAUDID)** 0.5 MG Intravenous Q4HPRN pain 4-6 for 7 Days, Clinician Dir:FOR PAIN NOT CONTROLLED BY PO MED           -   **ACETAMINOPHEN (TYLENOL)** 650 MG Oral Q4HPRN pain 1-3 or T > 38 C for 60 Days, Clinician Dir:NOT TO EXCEED 4 G OF APAP IN 24 HOURS           -   **ONDANSETRON (ZOFRAN)** 4 MG Intravenous Q6HPRN Nausea for 60 Days           -   **CYCLOBENZAPRINE (FLEXERIL)** 10 MG Oral Q8HPRN Spasms for 60 Days           -   **DIPHENHYDRAMINE (BENADRYL)** 25 MG Oral Q6HPRN Itching for 60 Days           -   **HYDROMORPHONE (DILAUDID)** 1 MG Intravenous Q2HPRN pain 7-10 for 7 Days, Clinician Dir:FOR PAIN NOT CONTROLLED BY PO MED           -   **GLUCAGON** 1 MG Intravenous PRN BG < 60 and no IV access  for 60 Days, Clinician Dir:MAY BE GIVEN IM OR SUB-Q           -   **DEXTROSE 50% SYRINGE (DEXTROSE 50%)** 12.5 G Intravenous PRN BG < 60 and can not take PO for 60 Days           Neurosurgery Assessment and Plan Comment 59yoF s/p left L4-5 TLIF with  bilateral screws. Doing well post-operatively.        Plan:    - Pain control: Tylenol PRN, Flexeril 10mg  q8h, Gabapentin 300mg  and  oxycodone 5-10 q3prn    - PT/OT    - OOB    - Rheum consult for advanced scleroderma, appreciate recs re: timing of  hydroxychloroquine/cellcept restart and management of scleroderma  manifestations    --- Reasonable to hold cellcept 1 week    --- Continue home hydroxychloroquine    --- Consider low dose prednisone (5mg ) if symptoms worsen    --- IVIG infusion was scheduled - patient can get at rehab or home, patient  will need to follow up.    - Neuro checks q4h    - Continue hemodynamic monitoring    - Encourage IS    - Tolerating RA, continue to maintain SpO2 goal > 92%    - Continue home medications: empagliflozin,  lisinopril, baclofen, urosidiol,  hydroxychloroquine    - SBP 90-140    --- PRN Hydralazine and Labetalol    - Diabetic diet    - LISS for sugar coverage    - Bowel regimen PRN while taking narcotics    - SCDs and SQ heparin for DVT prophylaxis        Appropriate for discharge to acute rehab when bed is available        [name]    Neurosurgery p4000.    **Assessment and Plan**    Medication              -   **HYDROXYCHLOROQUINE (PLAQUENIL)** 200 MG Oral QDAY for 60 Days           -   **OXYCODONE (PERCOLONE)** 5 MG Oral Q3HPRN Pain 4-6 for 5 Days           -   **OXYCODONE (PERCOLONE)** 10 MG Oral Q3HPRN Pain 7-10 for 5 Days           -   **SENNA (SENOKOT)** 17.2 MG Oral QDAY for 60 Days           -   **LISINOPRIL (PRINIVIL)** 2.5 MG Oral QDAY for 60 Days           -   **PANTOPRAZOLE DR (PROTONIX)** 40 MG Oral QDAY for 60 Days, Clinician Dir:IC FOR OMEPRAZOLE 40MG            -   **EMPAGLIFLOZIN (JARDIANCE)** 10 MG Oral QDAY for 60 Days           -   **POLYETHYLENE GLYCOL (MIRALAX)** 17 G Oral QDAY for 60 Days           -   **GABAPENTIN (NEURONTIN)** 300 MG Oral TID for 60 Days           -   **HEPARIN** 5000 UNITS Subcutaneous Q8H for 60 Days           -   **FAMOTIDINE (PEPCID)** 20 MG Oral QHS for 60 Days           -   **GABAPENTIN (NEURONTIN)** 300 MG Oral QHS for 60 Days           -   **  TRAZODONE (DESYREL)** 100 MG Oral QHS for 60 Days           -   **URSODIOL (URSO)** 500 MG Oral BID for 60 Days           -   **BACLOFEN (LIORESAL)** 10 MG Oral BID for 60 Days           -   **NORMAL SALINE FLUSH 3 ML SYR (SALINE FLUSH)** 3 ML Intravenous Q8H for 60 Days, Clinician UEA:[id number] FOR PERIPHERAL LINE           -   ** ** ** **INSULIN LISPRO (HUMALOG) (HUMALOG)******** Sliding Scale Subcutaneous TIDWM for 60 Days      For Glucose 80 to 150 Give 0 Units    For Glucose 151 to 200 Give 2 Units    For Glucose 201 to 250 Give 4 Units    For Glucose 251 to 300 Give 6 Units    Notify  Physician if: Call HO for glucose > 300          -   **LIDOCAINE 5% (LIDODERM 5%)** 1 PATCH Topical QDAYPRN Pain for 60 Days, Clinician Dir:12 HOURS ON, 12 HOURS OFF           -   **GLUCAGON** 1 MG Intravenous PRN BG < 60 and no IV access for 60 Days, Clinician Dir:MAY BE GIVEN IM OR SUB-Q           -   **HYDRALAZINE (APRESOLINE)** 10 MG Intravenous Q4HPRN SBP 140-180 mmHg & HR <=70 bpm for 60 Days           -   **NACL 0.9% (STOCK)** (1000 ML bag) Intravenous @80mL /Hour Over 12.5H for 60 Days, Clinician JXB:[id number] WHEN TAKING ADEQUATE PO           -   **DEXTROSE 50% SYRINGE (DEXTROSE 50%)** 12.5 G Intravenous PRN BG < 60 and can not take PO for 60 Days           -   **HYDRALAZINE (APRESOLINE)** 20 MG Intravenous Q4HPRN SBP > 180 mmHg & HR <= 70 bpm for 60 Days           -   **LABETALOL (TRANDATE)** 20 MG Intravenous Q4HPRN SBP > 180 mmHg and HR > 70 bpm for 60 Days           -   **LABETALOL (TRANDATE)** 10 MG Intravenous Q4HPRN SBP 140-180 mmHg & HR > 70 bpm for 60 Days           -   **HYDROMORPHONE (DILAUDID)** 0.5 MG Intravenous Q4HPRN pain 4-6 for 7 Days, Clinician Dir:FOR PAIN NOT CONTROLLED BY PO MED           -   **ACETAMINOPHEN (TYLENOL)** 650 MG Oral Q4HPRN pain 1-3 or T > 38 C for 60 Days, Clinician Dir:NOT TO EXCEED 4 G OF APAP IN 24 HOURS           -   **ONDANSETRON (ZOFRAN)** 4 MG Intravenous Q6HPRN Nausea for 60 Days           -   **CYCLOBENZAPRINE (FLEXERIL)** 10 MG Oral Q8HPRN Spasms for 60 Days           -   **DIPHENHYDRAMINE (BENADRYL)** 25 MG Oral Q6HPRN Itching for 60 Days           -   **HYDROMORPHONE (DILAUDID)** 1 MG Intravenous Q2HPRN pain 7-10 for 7 Days, Clinician Dir:FOR PAIN NOT CONTROLLED BY PO MED           -   **  GLUCAGON** 1 MG Intravenous PRN BG < 60 and no IV access for 60 Days, Clinician Dir:MAY BE GIVEN IM OR SUB-Q           -   **DEXTROSE 50% SYRINGE (DEXTROSE 50%)** 12.5 G Intravenous PRN BG < 60 and can not take PO for 60 Days            Neurosurgery Assessment and Plan Comment 59yoF s/p left L4-5 TLIF with  bilateral screws. Doing well post-operatively.        Plan:    - Pain control: Tylenol PRN, Flexeril 10mg  q8h, Gabapentin 300mg  and  oxycodone 5-10 q3prn    - PT/OT    - OOB    - Rheum consult for advanced scleroderma, appreciate recs re: timing of  hydroxychloroquine/cellcept restart and management of scleroderma  manifestations    --- Reasonable to hold cellcept 1 week    --- Continue home hydroxychloroquine    --- Consider low dose prednisone (5mg ) if symptoms worsen    --- IVIG infusion was scheduled - patient can get at rehab or home, patient  will need to follow up.    - Neuro checks q4h    - Continue hemodynamic monitoring    - Encourage IS    - Tolerating RA, continue to maintain SpO2 goal > 92%    - Continue home medications: empagliflozin, lisinopril, baclofen, urosidiol,  hydroxychloroquine    - SBP 90-140    --- PRN Hydralazine and Labetalol    - Diabetic diet    - LISS for sugar coverage    - Bowel regimen PRN while taking narcotics    - SCDs and SQ heparin for DVT prophylaxis        Appropriate for discharge to acute rehab when bed is available        [name]    Neurosurgery p4000.            **Electronically signed by [name] Miyamoto NP on 03/29/2020 08:35**        **Electronically signed by [name] Miyamoto NP on 03/29/2020 08:35**        **Electronically signed by [name] Miyamoto NP on 03/29/2020 08:35**

## 2020-03-24 NOTE — Progress Notes (Signed)
**  Note**    **Brief Operative Note**        Date: 03/24/20    Pre-op Diagnosis: lumbar stenosis    Post-Op Diagnosis: same    Procedure: left L4-5 TLIF with bilateral screws    Type Of Anesthesia: general anesthesia        Surgeon: Clemmie Krill, MD    Assistants: Reuel Derby, MD        Findings: refer to operative note for findings    Estimated Blood Loss: 60cc    Fluids: see anesthesia record    Drains: none    Blood Replacement: none    Specimens: none    Closure: steristrips, telfa, medipore tape        Complications: none    Patient Disposition: PACU to PG5N          Alda Lea, MD    Neurosurgery p4000        **Note**    **Brief Operative Note**        Date: 03/24/20    Pre-op Diagnosis: lumbar stenosis    Post-Op Diagnosis: same    Procedure: left L4-5 TLIF with bilateral screws    Type Of Anesthesia: general anesthesia        Surgeon: Clemmie Krill, MD    Assistants: Reuel Derby, MD        Findings: refer to operative note for findings    Estimated Blood Loss: 60cc    Fluids: see anesthesia record    Drains: none    Blood Replacement: none    Specimens: none    Closure: steristrips, telfa, medipore tape        Complications: none    Patient Disposition: PACU to PG5N          Alda Lea, MD    Neurosurgery p4000                **Electronically signed by Alda Lea, MD on 03/24/2020 10:39**        **Electronically signed by Alda Lea, MD on 03/24/2020 10:39**

## 2020-03-24 NOTE — Op Note (Signed)
 Patient    Shaylynne, Lunt          Med Rec #:  [id number]  Name:  Operation  03/24/2020                Pt.  Dt:                                  Location:  .  [name]                               OPERATIVE REPORT  .  [name]  PREOPERATIVE DIAGNOSIS:  L4-L5 lumbar stenosis with spondylolisthesis.  [name]  POSTOPERATIVE DIAGNOSES:  L4-L5 lumbar stenosis with spondylolisthesis.  [name]  PROCEDURES PERFORMED:  1.  L4-L5 laminectomy with complete left L4-L5 facetectomy with  decompression.  2.  Left L4-L5 transforaminal lumbar interbody fusion using an Elevate  Medtronic expandable biomechanical device, AttraX mixed with iliac crest  bone marrow aspirate, left L4-L5 intertransverse arthrodesis/posterolateral  fusion using AttraX bone graft extender mixed with iliac crest bone marrow  aspirate and locally harvested autograft.  3.  Bilateral L4-L5 pedicle screw fixation with O-arm and Stealth  neuronavigation guidance.  4.  Iliac crest bone marrow aspirate.  .  ATTENDING SURGEON:  Corinna Capra, M.D.  .  ASSISTANT:  Alda Lea, M.D.  .  ANESTHESIA:  General.  .  INDICATIONS FOR PROCEDURE:  Kwanza Cancelliere has debilitating pain with  multiple different problems.  Her worst level is L4-L5 stenosis and  spondylolisthesis and she signed informed consent to proceed with surgery.  .  DESCRIPTION OF PROCEDURE:  Mrs. Bosserman was taken to the operating room and  verified.  General endotracheal anesthesia was induced.  Preoperative  intravenous antibiotics administered.  Carefully logrolled prone position  onto the [facility] table.  Great care insured, all bony prominences well  padded and the eyes were free of compression.  Lumbar prepped and draped in  the usual sterile fashion with a 3-stage prep with the first stage being  ethyl alcohol and second and third steps with ChloraPrep.  ChloraPrep was  allowed to dry before draping the patient and time out was performed.  .  Lateral fluoroscopy localized the incision over the L4-L5 level and was  made  3.5 cm to the left of midline.  The muscles were dilated and the  Shadow-Line was docked at the L4-L5 level.  Electrocautery defined the  posterior anatomy and the operating microscope was sterilely draped and  brought into the field for magnification, illumination, and microdissection  technique.  The Midas Rex drill was used to initiate the laminectomies in  the midline and took this out laterally on the left side and I noted severe  stenosis.  I completed the left L4-L5 facetectomy.  Under high  magnification, I used microdissection technique to elevate the ligamentum  flavum with a micro up-angled curette and resected ligamentum flavum and  decompress the L4 and L5 nerve roots.  The decompression was completed.  .  I opened into the L4-L5 disc space with #11 blade.  Interbody spreaders,  scrapers, curettes, and pituitary instruments removed as much disc as  possible.  Curettes meticulously prepared endplates for interbody  arthrodesis and I copiously irrigated.  Jamshidi aspirated several mL of  bone marrow aspirate from the iliac crest site through a small separate  stab incision.  Bone marrow  was mixed with AttraX bone graft extender and  placed into a Medtronic Elevate biomechanical device, which was carefully  applied to the disc space using fluoroscopic guidance and tamp and mallet  and expanded and showed satisfactory positioning of the graft.  Locally  harvested autograft was placed in the disc space for additional arthrodesis  purposes.  .  An iliac crest reference frame was placed and then an O-arm spin was  performed to prepare for use of neuronavigation for pedicle screw  placement.  .  Medtronic Solera screws were now placed into the left L4 and L5 pedicles  under navigation Stealth guidance.  A small percutaneous incision was made  on the right side and right L4 and L5 pedicle screws were also placed  percutaneously.  Rods were placed bilaterally and set screws were used and  a torque counter device  was used for final tightening bilaterally and  repeat O-arm imaging showed excellent positioning of all the hardware.  Copious irrigation was used and AttraX bone graft extender with iliac crest  bone marrow aspirate and locally harvested autograft was placed along the  decorticated left L4 and L5 posterior elements to complete the  posterolateral arthrodesis.  Surgifoam and bipolar achieved hemostasis.  The retractors were removed and a standard 3-layer closures were performed  bilaterally with irrigation after each layer, and Steri-Strips were placed  for final skin closure.  She was carefully logrolled supine, extubated, and  moved her lower extremities with her preoperative strength.  All instrument  and sponge counts were complete and correct and there were no  intraoperative complications.  .  Electronically Signed  Corinna Capra, MD 03/29/2020 08:18 A  .  [name]  Dictated byCorinna Capra, MD  .  D:    03/26/2020  T:    03/26/2020 05:39 P  Dictation ID:  [id number]/JSR#  [id number]  .  cc:  .  [name]      Document is preliminary until electronically or manually signed by                             attending physician.

## 2020-03-24 NOTE — Progress Notes (Signed)
 **Subjective**    Subjective Patient is still having difficulty with pain control, with most of  the pain in her leg. Reports she had some difficulty getting her pain  medication yesterday, but they did work when she got them.    **Subjective**    Subjective Patient is still having difficulty with pain control, with most of  the pain in her leg. Reports she had some difficulty getting her pain  medication yesterday, but they did work when she got them.    **Exam**    Comment VS: HR 99 BP 171/80 RR 17 SpO2 100% on RA    GEN: Uncomfortable-appearing woman, laying on her side in bed. Awake and  interactive.    NEURO:    5/5 IP 5/5    5/5 Q 5/5    5/5 AT 5/5    5/5 G 5/5        Incisions clean, dry, intact.      Vital Signs    03/28/2020 15:27              -  Temperature: 36.4 (35-37.8Cel)          -  Site: Oral          -  Heart Rate: 81 (60-90)          -  Site: Monitor          -  BP: 143/86H (90-140/60-90)          -  Site: Right Arm          -  Position: Lying          -  Method: Automated          -  Mean Arterial Pressure1: 103 (60)          -  Respirations: 16 (12-20)          -  O2 Saturation (%): 100          -  O2 Delivery Method: Room Air          -  MEWS Vital Sign Score: 0      03/28/2020 08:00              -  Morse Fall Risk Total: 50      03/28/2020 07:36              -  Fingerstick Glucose: 104      CFS Vital Signs CS    03/28/2020 14:59              -  Shift Intake Total:          -  Shift Output Total:          -  Shift Balance: -        **Exam**    Comment VS: HR 99 BP 171/80 RR 17 SpO2 100% on RA    GEN: Uncomfortable-appearing woman, laying on her side in bed. Awake and  interactive.    NEURO:    5/5 IP 5/5    5/5 Q 5/5    5/5 AT 5/5    5/5 G 5/5        Incisions clean, dry, intact.      Vital Signs    03/28/2020 15:27              -  Temperature: 36.4 (35-37.8Cel)          -  Site: Oral          -  Heart Rate: 81 (60-90)          -   Site: Monitor          -  BP: 143/86H (90-140/60-90)          -  Site: Right Arm          -  Position: Lying          -  Method: Automated          -  Mean Arterial Pressure1: 103 (60)          -  Respirations: 16 (12-20)          -  O2 Saturation (%): 100          -  O2 Delivery Method: Room Air          -  MEWS Vital Sign Score: 0      03/28/2020 08:00              -  Morse Fall Risk Total: 50      03/28/2020 07:36              -  Fingerstick Glucose: 104      CFS Vital Signs CS    03/28/2020 14:59              -  Shift Intake Total:          -  Shift Output Total:          -  Shift Balance: -        **Assessment and Plan**    Medication              -   **HYDROXYCHLOROQUINE (PLAQUENIL)** 200 MG Oral QDAY for 60 Days           -   **OXYCODONE (PERCOLONE)** 10 MG Oral Q3HPRN Pain 7-10 for 5 Days           -   **OXYCODONE (PERCOLONE)** 5 MG Oral Q3HPRN Pain 4-6 for 5 Days           -   **POLYETHYLENE GLYCOL (MIRALAX)** 17 G Oral QDAY for 60 Days           -   **PANTOPRAZOLE DR (PROTONIX)** 40 MG Oral QDAY for 60 Days, Clinician Dir:IC FOR OMEPRAZOLE 40MG            -   **EMPAGLIFLOZIN (JARDIANCE)** 10 MG Oral QDAY for 60 Days           -   **SENNA (SENOKOT)** 17.2 MG Oral QDAY for 60 Days           -   **LISINOPRIL (PRINIVIL)** 2.5 MG Oral QDAY for 60 Days           -   **GABAPENTIN (NEURONTIN)** 300 MG Oral TID for 60 Days           -   **FAMOTIDINE (PEPCID)** 20 MG Oral QHS for 60 Days           -   **GABAPENTIN (NEURONTIN)** 300 MG Oral QHS for 60 Days           -   **HEPARIN** 5000 UNITS Subcutaneous Q8H for 60 Days           -   **BACLOFEN (LIORESAL)** 10 MG Oral BID for 60 Days           -   **TRAZODONE (DESYREL)** 100 MG Oral QHS for 60 Days           -   **  URSODIOL (URSO)** 500 MG Oral BID for 60 Days           -   **NORMAL SALINE FLUSH 3 ML SYR (SALINE FLUSH)** 3 ML Intravenous Q8H for 60 Days, Clinician ZOX:WRUEA FOR PERIPHERAL  LINE           -   ** ** ** **INSULIN LISPRO (HUMALOG) (HUMALOG)******** Sliding Scale Subcutaneous TIDWM for 60 Days      For Glucose 80 to 150 Give 0 Units    For Glucose 151 to 200 Give 2 Units    For Glucose 201 to 250 Give 4 Units    For Glucose 251 to 300 Give 6 Units    Notify Physician if: Call HO for glucose > 300          -   **LIDOCAINE 5% (LIDODERM 5%)** 1 PATCH Topical QDAYPRN Pain for 60 Days, Clinician Dir:12 HOURS ON, 12 HOURS OFF           -   **DEXTROSE 50% SYRINGE (DEXTROSE 50%)** 12.5 G Intravenous PRN BG < 60 and can not take PO for 60 Days           -   **ONDANSETRON (ZOFRAN)** 4 MG Intravenous Q6HPRN Nausea for 60 Days           -   **ACETAMINOPHEN (TYLENOL)** 650 MG Oral Q4HPRN pain 1-3 or T > 38 C for 60 Days, Clinician Dir:NOT TO EXCEED 4 G OF APAP IN 24 HOURS           -   **NACL 0.9% (STOCK)** (1000 ML bag) Intravenous @80mL /Hour Over 12.5H for 60 Days, Clinician VWU:[id number] WHEN TAKING ADEQUATE PO           -   **CYCLOBENZAPRINE (FLEXERIL)** 10 MG Oral Q8HPRN Spasms for 60 Days           -   **HYDRALAZINE (APRESOLINE)** 10 MG Intravenous Q4HPRN SBP 140-180 mmHg & HR <=70 bpm for 60 Days           -   **HYDROMORPHONE (DILAUDID)** 0.5 MG Intravenous Q4HPRN pain 4-6 for 7 Days, Clinician Dir:FOR PAIN NOT CONTROLLED BY PO MED           -   **HYDROMORPHONE (DILAUDID)** 1 MG Intravenous Q2HPRN pain 7-10 for 7 Days, Clinician Dir:FOR PAIN NOT CONTROLLED BY PO MED           -   **HYDRALAZINE (APRESOLINE)** 20 MG Intravenous Q4HPRN SBP > 180 mmHg & HR <= 70 bpm for 60 Days           -   **LABETALOL (TRANDATE)** 20 MG Intravenous Q4HPRN SBP > 180 mmHg and HR > 70 bpm for 60 Days           -   **DIPHENHYDRAMINE (BENADRYL)** 25 MG Oral Q6HPRN Itching for 60 Days           -   **LABETALOL (TRANDATE)** 10 MG Intravenous Q4HPRN SBP 140-180 mmHg & HR > 70 bpm for 60 Days           -   **GLUCAGON** 1 MG Intravenous PRN BG < 60 and no IV access for 60 Days, Clinician  Dir:MAY BE GIVEN IM OR SUB-Q           -   **GLUCAGON** 1 MG Intravenous PRN BG < 60 and no IV access for 60 Days, Clinician Dir:MAY BE GIVEN IM OR SUB-Q           -   **  DEXTROSE 50% SYRINGE (DEXTROSE 50%)** 12.5 G Intravenous PRN BG < 60 and can not take PO for 60 Days           Neurosurgery Assessment and Plan Comment 59yoF s/p left L4-5 TLIF with  bilateral screws. Doing well post-operatively.        Plan:    - Pain control: Tylenol PRN, Flexeril 10mg  q8h, Dilaudid 0.5-1mg  IV q2h PRN    --- Increase oxycodone 5-10 to q3prn    - PT/OT    - OOB    - Rheum consult for advanced scleroderma, appreciate recs re: timing of  hydroxychloroquine/cellcept restart and management of scleroderma  manifestations    --- Reasonable to hold cellcept 1-2 weeks    --- Restarted home hydroxychloroquine    --- Consider low dose prednisone (5mg ) if symptoms worsen    --- IVIG infusion scheduled "soon" \- patient can get at rehab or home    - Neuro checks q4h    - Continue hemodynamic monitoring    - Encourage IS    - Wean O2 as tolerated, SpO2 goal > 92%    - Continue home medications: empagliflozin, lisinopril, baclofen,  clindamycin, urosidiol, hydroxychloroquine    - SBP 90-14    --- PRN Hydralazine and Labetalol    - Diabetic diet    - LISS for sugar coverage    - Bowel regimen PRN while taking narcotics    - SCDs and SQ heparin for DVT prophylaxis        Appropriate for discharge to acute rehab when bed is available        Carilyn Goodpasture    Neurosurgery p4000.    **Assessment and Plan**    Medication              -   **HYDROXYCHLOROQUINE (PLAQUENIL)** 200 MG Oral QDAY for 60 Days           -   **OXYCODONE (PERCOLONE)** 10 MG Oral Q3HPRN Pain 7-10 for 5 Days           -   **OXYCODONE (PERCOLONE)** 5 MG Oral Q3HPRN Pain 4-6 for 5 Days           -   **POLYETHYLENE GLYCOL (MIRALAX)** 17 G Oral QDAY for 60 Days           -   **PANTOPRAZOLE DR (PROTONIX)** 40 MG Oral QDAY for 60 Days, Clinician Dir:IC FOR OMEPRAZOLE 40MG             -   **EMPAGLIFLOZIN (JARDIANCE)** 10 MG Oral QDAY for 60 Days           -   **SENNA (SENOKOT)** 17.2 MG Oral QDAY for 60 Days           -   **LISINOPRIL (PRINIVIL)** 2.5 MG Oral QDAY for 60 Days           -   **GABAPENTIN (NEURONTIN)** 300 MG Oral TID for 60 Days           -   **FAMOTIDINE (PEPCID)** 20 MG Oral QHS for 60 Days           -   **GABAPENTIN (NEURONTIN)** 300 MG Oral QHS for 60 Days           -   **HEPARIN** 5000 UNITS Subcutaneous Q8H for 60 Days           -   **BACLOFEN (LIORESAL)** 10 MG Oral BID for 60 Days           -   **  TRAZODONE (DESYREL)** 100 MG Oral QHS for 60 Days           -   **URSODIOL (URSO)** 500 MG Oral BID for 60 Days           -   **NORMAL SALINE FLUSH 3 ML SYR (SALINE FLUSH)** 3 ML Intravenous Q8H for 60 Days, Clinician UYQ:IHKVQ FOR PERIPHERAL LINE           -   ** ** ** **INSULIN LISPRO (HUMALOG) (HUMALOG)******** Sliding Scale Subcutaneous TIDWM for 60 Days      For Glucose 80 to 150 Give 0 Units    For Glucose 151 to 200 Give 2 Units    For Glucose 201 to 250 Give 4 Units    For Glucose 251 to 300 Give 6 Units    Notify Physician if: Call HO for glucose > 300          -   **LIDOCAINE 5% (LIDODERM 5%)** 1 PATCH Topical QDAYPRN Pain for 60 Days, Clinician Dir:12 HOURS ON, 12 HOURS OFF           -   **DEXTROSE 50% SYRINGE (DEXTROSE 50%)** 12.5 G Intravenous PRN BG < 60 and can not take PO for 60 Days           -   **ONDANSETRON (ZOFRAN)** 4 MG Intravenous Q6HPRN Nausea for 60 Days           -   **ACETAMINOPHEN (TYLENOL)** 650 MG Oral Q4HPRN pain 1-3 or T > 38 C for 60 Days, Clinician Dir:NOT TO EXCEED 4 G OF APAP IN 24 HOURS           -   **NACL 0.9% (STOCK)** (1000 ML bag) Intravenous @80mL /Hour Over 12.5H for 60 Days, Clinician QVZ:[id number] WHEN TAKING ADEQUATE PO           -   **CYCLOBENZAPRINE (FLEXERIL)** 10 MG Oral Q8HPRN Spasms for 60 Days           -   **HYDRALAZINE (APRESOLINE)** 10 MG Intravenous Q4HPRN SBP 140-180 mmHg & HR  <=70 bpm for 60 Days           -   **HYDROMORPHONE (DILAUDID)** 0.5 MG Intravenous Q4HPRN pain 4-6 for 7 Days, Clinician Dir:FOR PAIN NOT CONTROLLED BY PO MED           -   **HYDROMORPHONE (DILAUDID)** 1 MG Intravenous Q2HPRN pain 7-10 for 7 Days, Clinician Dir:FOR PAIN NOT CONTROLLED BY PO MED           -   **HYDRALAZINE (APRESOLINE)** 20 MG Intravenous Q4HPRN SBP > 180 mmHg & HR <= 70 bpm for 60 Days           -   **LABETALOL (TRANDATE)** 20 MG Intravenous Q4HPRN SBP > 180 mmHg and HR > 70 bpm for 60 Days           -   **DIPHENHYDRAMINE (BENADRYL)** 25 MG Oral Q6HPRN Itching for 60 Days           -   **LABETALOL (TRANDATE)** 10 MG Intravenous Q4HPRN SBP 140-180 mmHg & HR > 70 bpm for 60 Days           -   **GLUCAGON** 1 MG Intravenous PRN BG < 60 and no IV access for 60 Days, Clinician Dir:MAY BE GIVEN IM OR SUB-Q           -   **GLUCAGON** 1 MG Intravenous PRN BG < 60 and no IV access  for 60 Days, Clinician Dir:MAY BE GIVEN IM OR SUB-Q           -   **DEXTROSE 50% SYRINGE (DEXTROSE 50%)** 12.5 G Intravenous PRN BG < 60 and can not take PO for 60 Days           Neurosurgery Assessment and Plan Comment 59yoF s/p left L4-5 TLIF with  bilateral screws. Doing well post-operatively.        Plan:    - Pain control: Tylenol PRN, Flexeril 10mg  q8h, Dilaudid 0.5-1mg  IV q2h PRN    --- Increase oxycodone 5-10 to q3prn    - PT/OT    - OOB    - Rheum consult for advanced scleroderma, appreciate recs re: timing of  hydroxychloroquine/cellcept restart and management of scleroderma  manifestations    --- Reasonable to hold cellcept 1-2 weeks    --- Restarted home hydroxychloroquine    --- Consider low dose prednisone (5mg ) if symptoms worsen    --- IVIG infusion scheduled "soon" \- patient can get at rehab or home    - Neuro checks q4h    - Continue hemodynamic monitoring    - Encourage IS    - Wean O2 as tolerated, SpO2 goal > 92%    - Continue home medications: empagliflozin, lisinopril,  baclofen,  clindamycin, urosidiol, hydroxychloroquine    - SBP 90-14    --- PRN Hydralazine and Labetalol    - Diabetic diet    - LISS for sugar coverage    - Bowel regimen PRN while taking narcotics    - SCDs and SQ heparin for DVT prophylaxis        Appropriate for discharge to acute rehab when bed is available        Carilyn Goodpasture    Neurosurgery p4000.            **Electronically signed by Carilyn Goodpasture, MD on 03/28/2020 18:31**        **Electronically signed by Carilyn Goodpasture, MD on 03/28/2020 18:31**

## 2020-03-24 NOTE — Progress Notes (Signed)
 **Neurosurgery**    Procedure 03/24/20: left L4-5 TLIF with bilateral screws.    **Neurosurgery**    Procedure 03/24/20: left L4-5 TLIF with bilateral screws.    **Subjective**    Subjective Patient is still having difficulty with pain control. No acute  events.    **Subjective**    Subjective Patient is still having difficulty with pain control. No acute  events.    **Exam**    Comment AF VSS on RA    GEN: Uncomfortable-appearing woman, laying on her side in bed. Awake and  interactive.    NEURO:    5/5 IP 5/5    5/5 Q 5/5    5/5 AT 5/5    5/5 G 5/5        Incisions clean, dry, intact.      Vital Signs    03/27/2020 11:31              -  Temperature: 36.6 (35-37.8Cel)          -  Site: Oral          -  Heart Rate: 107H (60-90)          -  Site: Monitor          -  BP: 104/68 (90-140/60-90)          -  Site: Right Arm          -  Position: Lying          -  Method: Automated          -  Mean Arterial Pressure1: 80 (60)          -  Respirations: 14 (12-20)          -  O2 Saturation (%): 97          -  O2 Delivery Method: Room Air          -  MEWS Vital Sign Score: 1      03/26/2020 20:30              -  Morse Fall Risk Total: 60      03/26/2020 08:31              -  Fingerstick Glucose: 98      CFS Vital Signs CS    03/27/2020 14:59              -  Shift Intake Total: 0ml          -  Shift Output Total:          -  Shift Balance: -        **Exam**    Comment AF VSS on RA    GEN: Uncomfortable-appearing woman, laying on her side in bed. Awake and  interactive.    NEURO:    5/5 IP 5/5    5/5 Q 5/5    5/5 AT 5/5    5/5 G 5/5        Incisions clean, dry, intact.      Vital Signs    03/27/2020 11:31              -  Temperature: 36.6 (35-37.8Cel)          -  Site: Oral          -  Heart Rate: 107H (60-90)          -  Site: Monitor          -  BP: 104/68 (90-140/60-90)          -  Site: Right Arm          -  Position: Lying          -  Method: Automated           -  Mean Arterial Pressure1: 80 (60)          -  Respirations: 14 (12-20)          -  O2 Saturation (%): 97          -  O2 Delivery Method: Room Air          -  MEWS Vital Sign Score: 1      03/26/2020 20:30              -  Lattie Corns Fall Risk Total: 60      03/26/2020 08:31              -  Fingerstick Glucose: 98      CFS Vital Signs CS    03/27/2020 14:59              -  Shift Intake Total: 0ml          -  Shift Output Total:          -  Shift Balance: -        **Assessment and Plan**    Medication              -   **HYDROXYCHLOROQUINE (PLAQUENIL)** 200 MG Oral QDAY for 60 Days           -   **OXYCODONE (PERCOLONE)** 5 MG Oral Q3HPRN Pain 4-6 for 5 Days           -   **OXYCODONE (PERCOLONE)** 10 MG Oral Q3HPRN Pain 7-10 for 5 Days           -   **POLYETHYLENE GLYCOL (MIRALAX)** 17 G Oral QDAY for 60 Days           -   **SENNA (SENOKOT)** 17.2 MG Oral QDAY for 60 Days           -   **PANTOPRAZOLE DR (PROTONIX)** 40 MG Oral QDAY for 60 Days, Clinician Dir:IC FOR OMEPRAZOLE 40MG            -   **EMPAGLIFLOZIN (JARDIANCE)** 10 MG Oral QDAY for 60 Days           -   **LISINOPRIL (PRINIVIL)** 2.5 MG Oral QDAY for 60 Days           -   **GABAPENTIN (NEURONTIN)** 300 MG Oral TID for 60 Days           -   **FAMOTIDINE (PEPCID)** 20 MG Oral QHS for 60 Days           -   **URSODIOL (URSO)** 500 MG Oral BID for 60 Days           -   **HEPARIN** 5000 UNITS Subcutaneous Q8H for 60 Days           -   **GABAPENTIN (NEURONTIN)** 300 MG Oral QHS for 60 Days           -   **BACLOFEN (LIORESAL)** 10 MG Oral BID for 60 Days           -   **TRAZODONE (DESYREL)** 100 MG Oral QHS for 60 Days           -   **  NORMAL SALINE FLUSH 3 ML SYR (SALINE FLUSH)** 3 ML Intravenous Q8H for 60 Days, Clinician ZOX:[id number] FOR PERIPHERAL LINE           -   ** ** ** **INSULIN LISPRO (HUMALOG) (HUMALOG)******** Sliding Scale Subcutaneous TIDWM for 60 Days      For Glucose 80 to 150 Give 0 Units     For Glucose 151 to 200 Give 2 Units    For Glucose 201 to 250 Give 4 Units    For Glucose 251 to 300 Give 6 Units    Notify Physician if: Call HO for glucose > 300          -   **LIDOCAINE 5% (LIDODERM 5%)** 1 PATCH Topical QDAYPRN Pain for 60 Days, Clinician Dir:12 HOURS ON, 12 HOURS OFF           -   **ONDANSETRON (ZOFRAN)** 4 MG Intravenous Q6HPRN Nausea for 60 Days           -   **LABETALOL (TRANDATE)** 10 MG Intravenous Q4HPRN SBP 140-180 mmHg & HR > 70 bpm for 60 Days           -   **DEXTROSE 50% SYRINGE (DEXTROSE 50%)** 12.5 G Intravenous PRN BG < 60 and can not take PO for 60 Days           -   **NACL 0.9% (STOCK)** (1000 ML bag) Intravenous @80mL /Hour Over 12.5H for 60 Days, Clinician VWU:[id number] WHEN TAKING ADEQUATE PO           -   **ACETAMINOPHEN (TYLENOL)** 650 MG Oral Q4HPRN pain 1-3 or T > 38 C for 60 Days, Clinician Dir:NOT TO EXCEED 4 G OF APAP IN 24 HOURS           -   **DIPHENHYDRAMINE (BENADRYL)** 25 MG Oral Q6HPRN Itching for 60 Days           -   **HYDRALAZINE (APRESOLINE)** 10 MG Intravenous Q4HPRN SBP 140-180 mmHg & HR <=70 bpm for 60 Days           -   **HYDROMORPHONE (DILAUDID)** 0.5 MG Intravenous Q4HPRN pain 4-6 for 7 Days, Clinician Dir:FOR PAIN NOT CONTROLLED BY PO MED           -   **HYDROMORPHONE (DILAUDID)** 1 MG Intravenous Q2HPRN pain 7-10 for 7 Days, Clinician Dir:FOR PAIN NOT CONTROLLED BY PO MED           -   **HYDRALAZINE (APRESOLINE)** 20 MG Intravenous Q4HPRN SBP > 180 mmHg & HR <= 70 bpm for 60 Days           -   **CYCLOBENZAPRINE (FLEXERIL)** 10 MG Oral Q8HPRN Spasms for 60 Days           -   **LABETALOL (TRANDATE)** 20 MG Intravenous Q4HPRN SBP > 180 mmHg and HR > 70 bpm for 60 Days           -   **GLUCAGON** 1 MG Intravenous PRN BG < 60 and no IV access for 60 Days, Clinician Dir:MAY BE GIVEN IM OR SUB-Q           -   **GLUCAGON** 1 MG Intravenous PRN BG < 60 and no IV access for 60 Days, Clinician Dir:MAY BE GIVEN IM OR SUB-Q            -   **DEXTROSE 50% SYRINGE (DEXTROSE 50%)** 12.5 G Intravenous PRN BG < 60 and can not take PO for 60 Days  Neurosurgery Assessment and Plan Comment [name] s/p left L4-5 TLIF with  bilateral screws. Doing well post-operatively.        Plan:    - Pain control: Tylenol PRN, Flexeril 10mg  q8h, Dilaudid 0.5-1mg  IV q2h PRN    --- Increase oxycodone 5-10 to q3prn    - PT/OT, OOB    - Rheum consult for advanced scleroderma, appreciate recs re: timing of  hydroxychloroquine/cellcept restart and management of scleroderma  manifestations    --- Reasonable to hold cellcept 1-2 weeks    --- Restarted home hydroxychloroquine    --- Consider low dose prednisone (5mg ) if symptoms worsen    - Neuro checks q4h    - Continue hemodynamic monitoring    - Encourage IS    - Wean O2 as tolerated, SpO2 goal > 92%    - Continue home medications: empagliflozin, lisinopril, baclofen,  clindamycin, urosidiol, hydroxychloroquine    - SBP 90-14    --- PRN Hydralazine and Labetalol    - ADAT, diabetic    - LISS for sugar coverage    - Bowel regimen PRN while taking narcotics    - Continue IVF's until taking good PO    - SCDs and SQ heparin for DVT prophylaxis        Dispo: rehab        Geoffery Spruce, MD    Neurosurgery p4000.    **Assessment and Plan**    Medication              -   **HYDROXYCHLOROQUINE (PLAQUENIL)** 200 MG Oral QDAY for 60 Days           -   **OXYCODONE (PERCOLONE)** 5 MG Oral Q3HPRN Pain 4-6 for 5 Days           -   **OXYCODONE (PERCOLONE)** 10 MG Oral Q3HPRN Pain 7-10 for 5 Days           -   **POLYETHYLENE GLYCOL (MIRALAX)** 17 G Oral QDAY for 60 Days           -   **SENNA (SENOKOT)** 17.2 MG Oral QDAY for 60 Days           -   **PANTOPRAZOLE DR (PROTONIX)** 40 MG Oral QDAY for 60 Days, Clinician Dir:IC FOR OMEPRAZOLE 40MG            -   **EMPAGLIFLOZIN (JARDIANCE)** 10 MG Oral QDAY for 60 Days           -   **LISINOPRIL (PRINIVIL)** 2.5 MG Oral QDAY for 60 Days           -   **GABAPENTIN  (NEURONTIN)** 300 MG Oral TID for 60 Days           -   **FAMOTIDINE (PEPCID)** 20 MG Oral QHS for 60 Days           -   **URSODIOL (URSO)** 500 MG Oral BID for 60 Days           -   **HEPARIN** 5000 UNITS Subcutaneous Q8H for 60 Days           -   **GABAPENTIN (NEURONTIN)** 300 MG Oral QHS for 60 Days           -   **BACLOFEN (LIORESAL)** 10 MG Oral BID for 60 Days           -   **TRAZODONE (DESYREL)** 100 MG Oral QHS for 60 Days           -   **  NORMAL SALINE FLUSH 3 ML SYR (SALINE FLUSH)** 3 ML Intravenous Q8H for 60 Days, Clinician GEX:[id number] FOR PERIPHERAL LINE           -   ** ** ** **INSULIN LISPRO (HUMALOG) (HUMALOG)******** Sliding Scale Subcutaneous TIDWM for 60 Days      For Glucose 80 to 150 Give 0 Units    For Glucose 151 to 200 Give 2 Units    For Glucose 201 to 250 Give 4 Units    For Glucose 251 to 300 Give 6 Units    Notify Physician if: Call HO for glucose > 300          -   **LIDOCAINE 5% (LIDODERM 5%)** 1 PATCH Topical QDAYPRN Pain for 60 Days, Clinician Dir:12 HOURS ON, 12 HOURS OFF           -   **ONDANSETRON (ZOFRAN)** 4 MG Intravenous Q6HPRN Nausea for 60 Days           -   **LABETALOL (TRANDATE)** 10 MG Intravenous Q4HPRN SBP 140-180 mmHg & HR > 70 bpm for 60 Days           -   **DEXTROSE 50% SYRINGE (DEXTROSE 50%)** 12.5 G Intravenous PRN BG < 60 and can not take PO for 60 Days           -   **NACL 0.9% (STOCK)** (1000 ML bag) Intravenous @80mL /Hour Over 12.5H for 60 Days, Clinician LKG:[id number] WHEN TAKING ADEQUATE PO           -   **ACETAMINOPHEN (TYLENOL)** 650 MG Oral Q4HPRN pain 1-3 or T > 38 C for 60 Days, Clinician Dir:NOT TO EXCEED 4 G OF APAP IN 24 HOURS           -   **DIPHENHYDRAMINE (BENADRYL)** 25 MG Oral Q6HPRN Itching for 60 Days           -   **HYDRALAZINE (APRESOLINE)** 10 MG Intravenous Q4HPRN SBP 140-180 mmHg & HR <=70 bpm for 60 Days           -   **HYDROMORPHONE (DILAUDID)** 0.5 MG Intravenous Q4HPRN pain 4-6 for 7 Days, Clinician  Dir:FOR PAIN NOT CONTROLLED BY PO MED           -   **HYDROMORPHONE (DILAUDID)** 1 MG Intravenous Q2HPRN pain 7-10 for 7 Days, Clinician Dir:FOR PAIN NOT CONTROLLED BY PO MED           -   **HYDRALAZINE (APRESOLINE)** 20 MG Intravenous Q4HPRN SBP > 180 mmHg & HR <= 70 bpm for 60 Days           -   **CYCLOBENZAPRINE (FLEXERIL)** 10 MG Oral Q8HPRN Spasms for 60 Days           -   **LABETALOL (TRANDATE)** 20 MG Intravenous Q4HPRN SBP > 180 mmHg and HR > 70 bpm for 60 Days           -   **GLUCAGON** 1 MG Intravenous PRN BG < 60 and no IV access for 60 Days, Clinician Dir:MAY BE GIVEN IM OR SUB-Q           -   **GLUCAGON** 1 MG Intravenous PRN BG < 60 and no IV access for 60 Days, Clinician Dir:MAY BE GIVEN IM OR SUB-Q           -   **DEXTROSE 50% SYRINGE (DEXTROSE 50%)** 12.5 G Intravenous PRN BG < 60 and can not take PO for 60 Days  Neurosurgery Assessment and Plan Comment [name] s/p left L4-5 TLIF with  bilateral screws. Doing well post-operatively.        Plan:    - Pain control: Tylenol PRN, Flexeril 10mg  q8h, Dilaudid 0.5-1mg  IV q2h PRN    --- Increase oxycodone 5-10 to q3prn    - PT/OT, OOB    - Rheum consult for advanced scleroderma, appreciate recs re: timing of  hydroxychloroquine/cellcept restart and management of scleroderma  manifestations    --- Reasonable to hold cellcept 1-2 weeks    --- Restarted home hydroxychloroquine    --- Consider low dose prednisone (5mg ) if symptoms worsen    - Neuro checks q4h    - Continue hemodynamic monitoring    - Encourage IS    - Wean O2 as tolerated, SpO2 goal > 92%    - Continue home medications: empagliflozin, lisinopril, baclofen,  clindamycin, urosidiol, hydroxychloroquine    - SBP 90-14    --- PRN Hydralazine and Labetalol    - ADAT, diabetic    - LISS for sugar coverage    - Bowel regimen PRN while taking narcotics    - Continue IVF's until taking good PO    - SCDs and SQ heparin for DVT prophylaxis        Dispo: rehab        Geoffery Spruce,  MD    Neurosurgery p4000.            **Electronically signed by Althea Charon, MD on 03/27/2020 13:16**        **Electronically signed by Althea Charon, MD on 03/27/2020 13:16**

## 2020-03-25 ENCOUNTER — Ambulatory Visit

## 2020-03-25 LAB — HX POINT OF CARE
HX GLUCOSE-POCT: 109 mg/dL (ref 70–139)
HX GLUCOSE-POCT: 143 mg/dL — ABNORMAL HIGH (ref 70–139)
HX GLUCOSE-POCT: 163 mg/dL — ABNORMAL HIGH (ref 70–139)
HX GLUCOSE-POCT: 168 mg/dL — ABNORMAL HIGH (ref 70–139)
HX GLUCOSE-POCT: 191 mg/dL — ABNORMAL HIGH (ref 70–139)
HX GLUCOSE-POCT: 92 mg/dL (ref 70–139)

## 2020-03-26 LAB — HX POINT OF CARE
HX GLUCOSE-POCT: 98 mg/dL (ref 70–139)
HX GLUCOSE-POCT: 137 mg/dL (ref 70–139)
HX GLUCOSE-POCT: 109 mg/dL (ref 70–139)
HX GLUCOSE-POCT: 108 mg/dL (ref 70–139)

## 2020-03-27 LAB — HX POINT OF CARE
HX GLUCOSE-POCT: 97 mg/dL (ref 70–139)
HX GLUCOSE-POCT: 124 mg/dL (ref 70–139)
HX GLUCOSE-POCT: 140 mg/dL — ABNORMAL HIGH (ref 70–139)
HX GLUCOSE-POCT: 111 mg/dL (ref 70–139)

## 2020-03-28 LAB — HX POINT OF CARE
HX GLUCOSE-POCT: 104 mg/dL (ref 70–139)
HX GLUCOSE-POCT: 132 mg/dL (ref 70–139)
HX GLUCOSE-POCT: 132 mg/dL (ref 70–139)
HX GLUCOSE-POCT: 125 mg/dL (ref 70–139)

## 2020-03-29 ENCOUNTER — Ambulatory Visit

## 2020-03-29 LAB — HX MICRO-RESP VIRAL PANEL: HX COVID-19 (SARS-COV-2) ADMIT SCREEN: NEGATIVE

## 2020-03-29 LAB — HX POINT OF CARE
HX GLUCOSE-POCT: 121 mg/dL (ref 70–139)
HX GLUCOSE-POCT: 124 mg/dL (ref 70–139)
HX GLUCOSE-POCT: 134 mg/dL (ref 70–139)

## 2020-03-30 ENCOUNTER — Ambulatory Visit

## 2020-03-30 ENCOUNTER — Ambulatory Visit: Admitting: Physical Medicine & Rehabilitation

## 2020-03-30 LAB — HX POINT OF CARE
HX GLUCOSE-POCT: 124 mg/dL (ref 70–139)
HX GLUCOSE-POCT: 123 mg/dL (ref 70–139)

## 2020-03-30 NOTE — Progress Notes (Signed)
* * *      COLLYNS, MCQUIGG [name] **DOB:** [birth year] (59 yo F) **Acc No.** [id number] **DOS:**  03/30/2020    ---        [name], Jessaca [name]**    ------    [name], DOB: [birth year]    [address], ([address] [place], [place], [place] [address]    Home: [phone]    [name]        * * *    Telephone Encounter    ---    Answered by    Piedad Climes    Date: 03/30/2020        Time: 09:05 AM    Caller    [name]    ------            Reason    requesting medical equipment            Message                      Patient called requested additional handicap equipment walker, grabber to reach things high up. She wasnt to too sure but possibly scooter she just wanted to request these items before the new year started.                Action Taken                      [facility]  03/30/2020 9:06:14 AM > Sent to MD      [facility]  03/30/2020 11:58:57 AM > wrote rx for the walker and grabber tool. For motorized scooter, it is a 3-step process that requires a face-to-face encounter and then usually a wheelchair eval with PT, and then a letter of medical necessity sent to insurance. This is better done after rehab is complete and is usually started at the inpatient rehab unit by the physiatrist there.            Stowell,Alexandra  03/30/2020 4:35:50 PM > Reached out to Hally she is being discharged now to Rehab center, she is notified about walker and grabber tool                    * * *                ---          * * *          ProviderGaynelle Adu, Javaya [place] 03/30/2020    ---    Note generated by e[employer] EMR/PM Software ([email or web address])

## 2020-03-30 NOTE — Discharge Summary (Signed)
 Patient Name: [name]            MRN: [id number]  DOB: 1960/05/20  Admit Date: 03/24/2020   5:53:00AM  Atn Physician: Corinna Capra MD  Age: 44Y  Discharge Medication List:  Allergies: No Known Drug Allergies, No Known Food Allergies  UserDefinedString2: [facility]  NEWMEDLIST  This is your current medication list; please discard all old medication lists.  Carry this with you at all times including emergency situations and take this to all doctor's appointments.  Please review this list carefully for changes in drugs, doses or frequency with which they are taken and remember to update this list when medications are changed.                                                                                                                                                                                         Take ONLY the medications listed on this form                                                                                                                                                                                                                    acetaminophen   500 mg Tablet, 2 tablet oral every eight hours as needed for pain  Additional Instructions:  Entered AV:WUJW  Herendeen, MD                                                                                                                                                          ---------------------------------------------------------------------------------------------------------------------------------------------------------------------------------------  baclofen   10 mg Tablet, 1 tablet oral twice a day    Additional Instructions:  Entered ZO:XWRU  Herendeen, MD                                                                                                                                                           ---------------------------------------------------------------------------------------------------------------------------------------------------------------------------------------                                                                                                                                                          calcium carbonate-vitamin D3 (Calcium 600 with Vitamin D3) 600 mg calcium (1,500 mg)-400 unit tablet,chewable, 1 tablet oral daily    Additional Instructions:  Entered EA:VWUJ  Herendeen, MD                                                                                                                                                          ---------------------------------------------------------------------------------------------------------------------------------------------------------------------------------------  cyclobenzaprine   10 mg Tablet, 1 tablet oral every eight hours as needed for Spasms  Additional Instructions:  Entered ZO:XWRUEAVW Shannon Patoulidis, PA                                                                                                                                                          ---------------------------------------------------------------------------------------------------------------------------------------------------------------------------------------                                                                                                                                                          diphenhydramine HCl   25 mg Capsule, 1 capsule oral every six hours as needed for Itching  Additional Instructions:  Entered UJ:WJXB  Herendeen, MD                                                                                                                                                           ---------------------------------------------------------------------------------------------------------------------------------------------------------------------------------------  empagliflozin (Jardiance) 10 mg Tablet, 1 tablet oral twice a day    Additional Instructions:  Entered ZO:XWRU  Herendeen, MD                                                                                                                                                          ---------------------------------------------------------------------------------------------------------------------------------------------------------------------------------------                                                                                                                                                          famotidine   20 mg Tablet, 1 tablet oral daily    Additional Instructions:  Entered EA:VWUJ  Herendeen, MD                                                                                                                                                          ---------------------------------------------------------------------------------------------------------------------------------------------------------------------------------------  gabapentin   300 mg Capsule, 1 capsule oral three times a day    Additional Instructions:  Entered UX:NATFTDDU Shannon Patoulidis, PA                                                                                                                                                           ---------------------------------------------------------------------------------------------------------------------------------------------------------------------------------------                                                                                                                                                          gabapentin   300 mg Capsule, 2 capsule oral daily    Additional Instructions:  Entered KG:URKYHCW M Belliveau                                                                                                                                                          ---------------------------------------------------------------------------------------------------------------------------------------------------------------------------------------  heparin (porcine)   5,000 unit/mL Solution, 1 mL subcutaneous every eight hours    Additional Instructions:  Entered WJ:XBJYNWGN Shannon Patoulidis, PA                                                                                                                                                          ---------------------------------------------------------------------------------------------------------------------------------------------------------------------------------------                                                                                                                                                          hydrocortisone   2.5 % Ointment, 1 application topical daily as needed for itching  Additional Instructions:  Entered FA:OZHY  Herendeen, MD                                                                                                                                                           ---------------------------------------------------------------------------------------------------------------------------------------------------------------------------------------  hydroxychloroquine   200 mg Tablet, 1 tablet oral daily    Additional Instructions:  Entered BM:WUXL  Herendeen, MD                                                                                                                                                          ---------------------------------------------------------------------------------------------------------------------------------------------------------------------------------------                                                                                                                                                          immun glob G(IgG)-gly-IgA ov50 (Gammagard Liquid) 10 % Solution, 1 L intravenous monthly    Additional Instructions:next dose due 04/23/20  Entered KG:MWNU  Herendeen, MD                                                                                                                                                          ---------------------------------------------------------------------------------------------------------------------------------------------------------------------------------------  lidocaine (Lidoderm) 5 % adhesive patch,medicated, 1 patch transdermal daily as needed for pain  Additional Instructions:  Entered BJ:YNWG  Herendeen, MD                                                                                                                                                           ---------------------------------------------------------------------------------------------------------------------------------------------------------------------------------------                                                                                                                                                          lisinopril   2.5 mg Tablet, 1 tablet oral daily    Additional Instructions:  Entered NF:AOZH  Herendeen, MD                                                                                                                                                          ---------------------------------------------------------------------------------------------------------------------------------------------------------------------------------------  ondansetron HCl (PF)   4 mg/2 mL Solution, 2 mL intravenous every six hours as needed for Nausea  Additional Instructions:  Entered NW:GNFA  Herendeen, MD                                                                                                                                                          ---------------------------------------------------------------------------------------------------------------------------------------------------------------------------------------                                                                                                                                                          oxyCODONE   5 mg Tablet, 1-2 oral every three hours as needed for pain-severe  Additional Instructions:  Entered OZ:HYQMVHQI [facility], PA                                                                                                                                                           ---------------------------------------------------------------------------------------------------------------------------------------------------------------------------------------  polyethylene glycol 3350   17 gram Powder in Packet, 1 each oral daily    Additional Instructions:  Entered ZO:XWRU  Herendeen, MD                                                                                                                                                          ---------------------------------------------------------------------------------------------------------------------------------------------------------------------------------------                                                                                                                                                          sennosides (Senna Laxative) 8.6 mg Tablet, 2 tablet oral daily    Additional Instructions:  Entered EA:VWUJ  Herendeen, MD                                                                                                                                                          ---------------------------------------------------------------------------------------------------------------------------------------------------------------------------------------  traZODone   100 mg Tablet, 1 tablet oral daily    Additional Instructions:  Entered WN:UUVO  Herendeen, MD                                                                                                                                                           ---------------------------------------------------------------------------------------------------------------------------------------------------------------------------------------                                                                                                                                                          ursodiol   500 mg Tablet, 1 tablet oral twice a day    Additional Instructions:  Entered ZD:GUYQ  Herendeen, MD                                                                                                                                                          ---------------------------------------------------------------------------------------------------------------------------------------------------------------------------------------  Documents reviewed with Family   /   Healthcare Proxy - Agent   /   Guardian       (circle role)  Discharge Nurse: _______________________________  Name of Unit: PG5N____________________ Phone:  [phone]- ______________  Date: ______________________________ Time:________________________  Electronically signed by:              Pt Name: JAKAYLEE SASAKI  MRN: [id number]    Created By  [employer] on 03/30/2020 at 08:41 AM    Electronically Signed By Theora Gianotti MD -[facility] 4A- on 03/30/2020 at 10:38 AM

## 2020-03-31 ENCOUNTER — Ambulatory Visit: Admitting: Rheumatology

## 2020-03-31 NOTE — Progress Notes (Signed)
[name], [name] **DOB:** [birth year] (59 yo F) **Acc No.** [id number] **DOS:**  03/31/2020    ---        [name], [name]**    ------    [name], DOB: 02/13/1961    [address], ([address] [place], [place] [address]    Home: [phone]    Provider: [name]        * * *    Telephone Encounter    ---    Answered by    Arva Chafe    Date: 03/31/2020        Time: 08:37 AM    Reason    updated plz read    ------            Message                      Carla from [facility] in [place] called in regards to pt. She was told to call our office in regards to pt's IVIG date? I don't see one currently. Booked for [name] on 2/4 at 10:40. Call back [phone] or directly at [phone].                Action Taken                      [facility]  03/31/2020 9:49:56 AM >  Pt get IVIG Q4 weeks.  Last infusion was preop on 12/20.  She will be due for next infusion around 1/17.  I called [facility] to see if they will do at rehab if she is still there then.  They are having someone call me back. I will wait for call.  Also updated [address] and [employer].      [name],[name]  04/05/2020 11:58:26 AM > Pt lvm asking for if she is still going to get her infusion now that she is post-op      [facility]  04/05/2020 1:39:27 PM > I think plan is for her to get it Q4 still since she got it a few days before the surgery.  I am in touch with [facility] and [name].  [facility]-can't do inpt infusions so it has to be at her home.      [name],[name]  04/06/2020 11:43:59 AM > LVMx1 for pt-she will let me know estimated discharge.  Will disucss with [name] on Friday.      [name],[name]  04/07/2020 9:43:02 AM > Patient called and left a voicemail wanting to update [name] on her condition.  She is currently still at the rehab center due to leave this Friday 04/09/2020.  She states she is doing very well; she is up walking with a walker and her incision looks good.  She also states she has  her infusion set up for 04/23/20.  She is available if Rose [facility] has any further questions via phone.      [name],[name]  04/07/2020 11:22:04 AM > Awesome this is all set then.  She has appt w/ [name] on 02/04.                    * * *                ---          * * *          Provider: [name] 03/31/2020    ---  Note generated by eClinicalWorks EMR/PM Software ([email or web address])

## 2020-04-06 ENCOUNTER — Ambulatory Visit: Admitting: Rheumatology

## 2020-04-06 ENCOUNTER — Ambulatory Visit: Admitting: Adult Health

## 2020-04-06 MED ORDER — Jardiance: 10 | Tablet | 4 refills | 0 days | Status: DC

## 2020-04-06 NOTE — Progress Notes (Signed)
* * *      [name], [name] **DOB:** 07-Jan-1961 (60 yo F) **Acc No.** [id number] **DOS:**  04/06/2020    ---        [name], [name]**    ------    38 Y old Female, DOB: [birth year]    [address], ([address] [place], [place] [address]    Home: [phone]    Provider: [name]        * * *    Telephone Encounter    ---    Answered by    Tomasita Crumble    Date: 04/06/2020        Time: 04:13 PM    Caller    Patient    ------            Reason    Update            Message                      Patient called and left a voicemail wanting to update [name] on her condition.  She is currently still at the rehab center due to leave this Friday 04/09/2020.  She states she is doing very well; she is up walking with a waler and her incision looks good.  She also states she has her infusion set up for 04/23/20.  She is available if Rose [facility] has any further questions via phone.                 Action Taken                      Brown,Gia  04/06/2020 4:14:50 PM >      [name],[name]  04/07/2020 9:43:24 AM > added to another enocounter will close                    * * *                ---          * * *          Provider: [name] 04/06/2020    ---    Note generated by eClinicalWorks EMR/PM Software ([email or web address])

## 2020-04-12 ENCOUNTER — Ambulatory Visit: Admitting: Rheumatology

## 2020-04-12 ENCOUNTER — Ambulatory Visit

## 2020-04-12 NOTE — Progress Notes (Signed)
* * *      [name], [name] **DOB:** 10/05/1960 (60 yo F) **Acc No.** [id number] **DOS:**  04/12/2020    ---        [name], [name]**    ------    71 Y old Female, DOB: [birth year]    [address], ([address] [place], [place] [address]    Home: [phone]    Provider: [name]        * * *    Telephone Encounter    ---    Answered by    [name]    Date: 04/12/2020        Time: 04:31 PM    Reason    *infusion orders    ------            Message                      Bonita Quin from [facility] lvm asking for gammagard orders/ PA. She is due 1/21 for her next infusion.  Call [place] [facility] back at [phone]                Action Taken                      [name],[name]  04/12/2020 4:33:40 PM >      KATCHE,CHRIST Lorita Officer G 04/12/2020 4:42:16 PM > Called Linda at the number provided above. They need a new order and most recent clinic note. They will fax over a blank order as they updated their order form. Bonita Quin notes that the patient is inpatient at the moment and she is expected to be discharged outpatient on 04/21/20, she has a PICC line but they can place a peripheral IV for her infusion on 04/22/20. Will follow-up once order is received.      KATCHE,CHRIST ANGE G 04/13/2020 2:14:32 PM > Never received order form, called [facility] to f/u. Bonita Quin will send blank form via email.      KATCHE,CHRIST ANGE G 04/14/2020 3:44:26 PM > Fax received, will prepare order and forward to provider for review and signature.       KATCHE,CHRIST ANGE G 04/14/2020 4:08:39 PM > Called [facility] to f/u, patient is all set as they resumed the order she had on file. The patient med will be delivered on 04/21/20 for service date on 04/23/20.      KATCHE,CHRIST ANGE G 04/14/2020 4:12:07 PM > Hi [name], per [facility] she is all set. Disregard my last text. Thanks!                     * * *                ---          * * *          Provider: [name] 04/12/2020    ---    Note generated by eClinicalWorks EMR/PM Software  ([email or web address])

## 2020-04-14 ENCOUNTER — Ambulatory Visit

## 2020-04-15 ENCOUNTER — Ambulatory Visit: Admitting: Student in an Organized Health Care Education/Training Program

## 2020-04-15 ENCOUNTER — Ambulatory Visit

## 2020-04-15 NOTE — Telephone Encounter (Signed)
 Call Details:   Patient PCP = [name] -PC 4B-  [name] (Patient) called on April 15, 2020 9:16 AM.  Message taken by: Blanchie Serve  Primary call-back number: [phone]    Secondary call-back number: () -    Call Reason(s): Message/Call-Back      ** MESSAGE / CALL-BACK.  Regarding: pt would like a c all back from [facility]chard MD [place] [facility].     ---------- ---------- ---------- ---------- ---------- ----------       RESPONSE/ORDERS:  Read through responses from phone note on 1/14.  Called [facility] and she stated her legs and feet are numb, very swollen.  She is currently in the parking lot at [facility] after she was told to come to the ED for x-rays.  She is having a challenging time moving.  I called transport to meet her in the parking lot and bring her to the ED for further evaluation.  For further details, please read the phone note from 1/14 ......................................Marland KitchenTomma Lightning MD -PC 4B-  April 20, 2020 1:51 PM                  ORDERS/PROBS/MEDS/ALL     Problems:   BACK PAIN, CHRONIC (ICD-724.5) (ICD10-M54.89)  NODULAR RASH (ICD-782.1) ([phone])  ANNUAL EXAM (18+) (ICD-V70.0) (ICD10-Z00.00)  RUPTURE OF MUSCLE (ICD-848.9) (ICD10-M62.18)  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) (TAE82-B74.93)  BONE LESION - PELVIS (ICD-733.90) (ICD10-M89.9)  GANGLION (ICD-727.43) (ICD10-M67.40)  INTERNAL HEMORRHOID (ICD-455.0) (ICD10-K64.8)  DIVERTICULOSIS, COLON (ICD-562.10) (ICD10-K57.30)  EROSIVE GASTRITIS (ICD-535.40) (ICD10-K29.60)  MUSCLE RUPTURE, NONTRAUMATIC (ICD-728.83) (ICD10-M62.10)  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-Q21.1)  SCREENING EXAM FOR BREAST CANCER (ICD-V76.10) (ICD10-Z12.39)  ANEMIA, OTHER (ICD-285.8) (ICD10-D64.89)  LOWER EXTREMITY EDEMA (ICD-782.3) (ICD10-R60.0)  IRREGULAR HEART BEATS (ICD-427.9) (ICD10-I49.9)  GI BLEED (ICD-578.9) (XLE17-G71.2)  SCLERODERMA (ICD-710.1) (ICD10-M34.9)  SCREENING FOR CERVICAL CANCER (ICD-V76.2) (TNB39-Y72.4)  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)  (ICD10-M47.812)  NECK PAIN (ICD-723.1) (ICD10-M54.2)  STEROID USE, LONG TERM (ICD-V58.65) (ICD10-Z79.52)  PREMATURE VENTRICULAR CONTRACTIONS (ICD-427.69) (ICD10-I49.3)  HYPERLIPIDEMIA (ICD-272.4) (ICD10-E78.5)  BACK PAIN, LUMBAR, CHRONIC (ICD-724.2) (ICD10-M54.5)  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)  DIABETES MELLITUS, TYPE II (ICD-250.00) (ICD10-E11.9)  LONG-TERM (CURRENT) USE OF STEROIDS (ICD-V58.65) (ICD10-Z79.51)  SKIN SAGGING DUE TO WEIGHT LOSS (ICD-757.9) (ICD10-Q84.9)  TRANSAMINASES, SERUM, ELEVATED (ICD-790.4) (ICD10-R74.0)  OBESITY, BMI 30-34.9, ADULT (ICD-278.00) (ICD10-E66.9)      DIABETES MELLITUS, TYPE II, UNCONTROLLED (ICD-250.02) (ICD10-E11.65)      FATTY LIVER DISEASE (ICD-571.8) (ICD10-K76.0)  SCLERODERMA, 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)  HYPOTHYROIDISM (ICD-244.9) (ICD10-E03.9)  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) (ICD10-D80.8)  SPINAL STENOSIS, 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)  HEPATITIS C EXPOSURE (HCV RNA NEGATIVE, 01/2014) (ICD-V02.62) (ICD10-Z20.5)  PAIN IN JOINT, HAND (ICD-719.44) (ICD10-M79.643)  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)  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)  CARPAL TUNNEL (ICD-354.0) (ICD10-G56.00)  OSTEOARTHRITIS (ICD-715.09) (ICD10-M15.9)  S/P ROTATOR CUFF SURGERY 12/2004 - RT SHOULDER; 2008 LT (ICD-V45.89)  Family Hx of MELANOMA, FAMILY HX (ICD-V16.8) (ICD10-Z80.8)  FRACTURE OF OTHER SPEC SITE,  PATHOLOGIC - MULTIPLE (ICD-733.19)  CHOLECYSTECTOMY AND HERNIA REPAIR (ICD-V45.89)  VITAMIN D DEFICIENCY (ICD-268.9) (ICD10-E55.9)  COLONOSCOPY, NEXT 2020- SEE COMMENT (ICD-V76.51) (ICD10-Z01.89)    Meds (prior to this call):   HYDROXYCHLOROQUINE SULFATE 200 MG ORAL TABLET (HYDROXYCHLOROQUINE SULFATE) one tablet oral daily; Route: ORAL  JARDIANCE 10 MG ORAL TABLET (EMPAGLIFLOZIN) Take 1 tablet by mouth once daily; Route: ORAL  ROSUVASTATIN 10MG  TABLETS (ROSUVASTATIN CALCIUM) TAKE 1 TABLET BY MOUTH EVERY DAY  LISINOPRIL 2.5MG   TABLETS (LISINOPRIL) TAKE 1 TABLET BY MOUTH ONCE DAILY  METFORMIN HCL ER  500 MG XR24H-TAB (METFORMIN HCL) TAKE 4 TABLETS BY MOUTH EVERY MORNING  OMEPRAZOLE 20 MG ORAL CAPSULE DELAYED RELEASE (OMEPRAZOLE) Take one capsule by mouth twice a day; Route: ORAL  DICLOFENAC SODIUM 1 % TRANSDERMAL GEL (DICLOFENAC SODIUM) APP 2 GRAMS EXT AA BID      FREESTYLE FREEDOM LITE w/Device KIT (BLOOD GLUCOSE MONITORING SUPPL) use as directed (ICD 10 E11.65)      FREESTYLE LITE BLOOD GLUCOSE STRIPS (GLUCOSE BLOOD) USE TO CHECK BLOOD GLUCOSE THREE TIMES DAILY      FREESTYLE LANCETS (LANCETS) check fingerstick three times a day (ICD 10 E11.65)  OXYCODONE HCL 5 MG ORAL TABLET (OXYCODONE HCL) Take one tablet daily as needed for pain >8. Partial fill upon patient request.; Route: ORAL  CALCIUM CARBONATE-VITAMIN D 600-200 MG-UNIT ORAL TABLET (CALCIUM CARBONATE-VITAMIN D) take one tablet twice a day; Route: ORAL  GABAPENTIN 300MG  CAPSULES (GABAPENTIN) TAKE 2 CAPSULE BY MOUTH EVERY NIGHT AT BEDTIME AS NEEDED FOR PAIN  * BLOODWORK Please draw chemistry (sodium, potassium, magnesium, chloride, bicarbonate, glucose, creatinine, BUN), CBC, TSH, lipids, HbA1c. Fax result to [phone] Dr. Marlane Hatcher  LIDOCAINE 5 % PTCH (LIDOCAINE) APPLY ONE PATCH TO BACK ONCE DAILY AS NEEDED FOR PAIN, REMOVE AFTER 12 HRS  MYCOPHENOLATE MOFETIL 500 MG ORAL TABLET (MYCOPHENOLATE MOFETIL) Take 2 tablets twice daily; Route: ORAL  GAMMAGARD 20 GM/200ML INJECTION SOLUTION (IMMUNE GLOBULIN (HUMAN)) Receives infusion every 4 weeks, next dose 8/5; Route: INJECTION  URSODIOL 500 MG ORAL TABLET (URSODIOL) Take 1 tablet twice daily; Route: ORAL  * WHEEL CHAIR WITH FOOT PEDALS USE AS INSTRUCTED HT 62.5 WT 158.6 M54.5  M48.06  * SHOWER CHAIR USE AS INSTRUCTED  HT 62.5 WT 158.9 E11.9 -- M54.5 -- M89.9  * RAISED TOILET SEAT   TRAZODONE HCL 100 MG ORAL TABLET (TRAZODONE HCL) Take 1 tablet at bedtime; Route: ORAL  * LOFT STAND CRUTCHES USE AS INSTRUCTED  HT 62.5 WT 158.6 M54.4 -- R60.0  -- M89.9  BACTRIM DS 800-160 MG ORAL TABLET (SULFAMETHOXAZOLE-TRIMETHOPRIM) Take one tablet by mouth twice daily for 7 days; Route: ORAL  baclofen 10 mg tablet (baclofen) Take 1 tablet by mouth twice a day as needed   famotidine 20 mg tablet (famotidine) TAKE 1 TABLET BY MOUTH AT NIGHT AS NEEDED            Created By Blanchie Serve on 04/15/2020 at 09:16 AM    Electronically Signed By [name] -PC 4B- on 04/20/2020 at 01:52 PM

## 2020-04-15 NOTE — Progress Notes (Signed)
* * *      [name], [name] **DOB:** 11/17/1960 (60 yo F) **Acc No.** [id number] **DOS:**  04/15/2020    ---        [name], [name]**    ------    57 Y old Female, DOB: 1960-07-19    [address], ([address] [place], [place], [place] [address]    Home: [phone]    Provider: Denton Meek        * * *    Telephone Encounter    ---    Answered by    [name]    Date: 04/15/2020        Time: 11:16 AM    Reason    urgent call back    ------            Message                      Pt lvm requesting help from [facility]. She left voicemail very upset stating that her foot is in a lot of pain. She dischraged from rehab on Monday this week and her toes are numb. the numbness/pain has since traveled to her entire foot.   She has spoken to Molli Knock in regards to getting an MRI                 Action Taken                      [name],[name]  04/15/2020 11:18:32 AM > I called [place] at extension [phone]. She is waiting for Dr Alfonse Alpers to finish a conference and then she will call her back. The plan so far is that patient will increase her gabapentin dose and potentially get an MRI. Teodoro Spray      [facility]  04/15/2020 11:37:47 AM > Okay, thank you.  She is aware right?  Thanks!      [name],[name]  04/15/2020 11:39:42 AM > [place] told me that she was going to call her as soon as the doctor gets out of the conference to discuss the plan with her.                    * * *                ---          * * *          Provider: Denton Meek 04/15/2020    ---    Note generated by eClinicalWorks EMR/PM Software ([email or web address])

## 2020-04-16 ENCOUNTER — Ambulatory Visit

## 2020-04-16 ENCOUNTER — Ambulatory Visit: Admitting: Rheumatology

## 2020-04-16 MED ORDER — Gabapentin: 300 | Capsule | Freq: Three times a day (TID) | 1 refills | 0 days | Status: AC

## 2020-04-16 MED ORDER — oxyCODONE HCl: 10 | 40 | 0 refills | 0 days | Status: AC

## 2020-04-16 NOTE — Progress Notes (Signed)
* * *      [name] [name], [name] [name] **DOB:** February 02, 1961 (60 yo F) **Acc No.** [id number] **DOS:**  04/16/2020    ---        [name] [name], [name] [name]**    ------    [name], DOB: 1960-06-20    [address], ([address] [name] [name] [place], [place] [address]    Home: [phone]    Provider: Judy [name]        * * *    Telephone Encounter    ---    Answered by    [name] [name]    Date: 04/16/2020        Time: 10:55 AM    Reason    Feet numb.    ------            Message                      Both feet are now numb. She cannot walk at this time.  There is an MRI order in from [name] [name]'s office. Pt is not scheduled for the MRI at this time waiting for PA request.        Numbness started in rehab from her toes and is now going down to her heel.  It is also started on her left foot as well. She is almost out of her gabapentin prescribed usually by her PCP. She has been waiting for her PCP to respond to the refill request.                 Action Taken                      [facility]  04/16/2020 10:58:12 AM >Spoke with [name] [name] about this. He discussed pt with [name] [name] who will discuss plan with patient.  Pt will go to local ED for ct scan and increase her medications per MD orders.                       * * *                ---          * * *          Provider: Judy [name] 04/16/2020    ---    Note generated by eClinicalWorks EMR/PM Software ([email or web address])

## 2020-04-16 NOTE — Progress Notes (Signed)
* * *      [name], [name] **DOB:** 14-Dec-1960 (60 yo F) **Acc No.** [id number] **DOS:**  04/16/2020    ---        [name], [name]**    ------    68 Y old Female, DOB: June 24, 1960    [address], ([address] [place], [place], [place] [address]    Home: [phone]    Provider: Secundino Ginger        * * *    Telephone Encounter    ---    Answered by    Secundino Ginger    Date: 04/16/2020        Time: 01:24 PM    Caller    pt    ------            Reason    oxycodone/gabapentin            Refills    Refill Gabapentin Capsule, 300 MG, Orally, 90 Capsule, 1 capsule,  three times a day, 30 days, Refills=1    ------      Refill oxyCODONE HCl Tablet, 10 MG, Orally, 40, 1 tablet as needed for  postop pain, every 6 hrs, Refills=0          * * *                ---          * * *          Provider: Secundino Ginger 04/16/2020    ---    Note generated by eClinicalWorks EMR/PM Software ([email or web address])

## 2020-04-20 ENCOUNTER — Ambulatory Visit: Admitting: Internal Medicine

## 2020-04-20 ENCOUNTER — Ambulatory Visit

## 2020-04-20 ENCOUNTER — Observation Stay: Admit: 2020-04-20 | Discharge: 2020-04-22 | Disposition: A | Discharge status: Home / Self Care

## 2020-04-20 LAB — HX HEM-ROUTINE
HX BASO #: 0 10*3/uL (ref 0.0–0.2)
HX BASO: 1 %
HX EOSIN #: 0.2 10*3/uL (ref 0.0–0.5)
HX EOSIN: 4 %
HX HCT: 40.6 % (ref 32.0–45.0)
HX HGB: 12.7 g/dL (ref 11.0–15.0)
HX IMMATURE GRANULOCYTE#: 0 10*3/uL (ref 0.0–0.1)
HX IMMATURE GRANULOCYTE: 1 %
HX LYMPH #: 1.4 10*3/uL (ref 1.0–4.0)
HX LYMPH: 26 %
HX MCH: 30.5 pg (ref 26.0–34.0)
HX MCHC: 31.3 g/dL — ABNORMAL LOW (ref 32.0–36.0)
HX MCV: 97.4 fL (ref 80.0–98.0)
HX MONO #: 0.5 10*3/uL (ref 0.2–0.8)
HX MONO: 9 %
HX MPV: 10.2 fL (ref 9.1–11.7)
HX NEUT #: 3.2 10*3/uL (ref 1.5–7.5)
HX NRBC #: 0 10*3/uL
HX NUCLEATED RBC: 0 %
HX PLT: 194 10*3/uL (ref 150–400)
HX RBC BLOOD COUNT: 4.17 M/uL (ref 3.70–5.00)
HX RDW: 13.9 % (ref 11.5–14.5)
HX SEG NEUT: 60 %
HX WBC: 5.3 10*3/uL (ref 4.0–11.0)

## 2020-04-20 LAB — HX CHEM-METABOLIC
HX FOLATE, SERUM: 9.3 ng/mL (ref >=4.0)
HX T3, TOTAL: 79 ng/dL (ref 58–159)
HX THYROID STIMULATING HORMONE (TSH): 5.72 u[IU]/mL — ABNORMAL HIGH (ref 0.35–4.94)
HX THYROXINE FREE: 0.72 ng/dL (ref 0.70–1.48)

## 2020-04-20 LAB — HX HEM-MISC: HX SED RATE: 45 mm — ABNORMAL HIGH (ref 0–30)

## 2020-04-20 LAB — HX CHEM-VITAMIN: HX VITAMIN B12: 1115 pg/mL — ABNORMAL HIGH (ref 211–911)

## 2020-04-20 LAB — HX CHEM-PANELS
HX 2021 EGFRCR: 101 mL/min/{1.73_m2} (ref 60–999)
HX ANION GAP: 7 (ref 3–14)
HX BLOOD UREA NITROGEN: 15 mg/dL (ref 6–24)
HX CHLORIDE (CL): 103 meq/L (ref 98–110)
HX CO2: 26 meq/L (ref 20–30)
HX CREATININE (CR): 0.7 mg/dL (ref 0.57–1.30)
HX GFR, AFRICAN AMERICAN: 111 mL/min/{1.73_m2}
HX GFR, NON-AFRICAN AMERICAN: 96 mL/min/{1.73_m2}
HX GLUCOSE: 101 mg/dL (ref 70–139)
HX POTASSIUM (K): 3.9 meq/L (ref 3.6–5.1)
HX SODIUM (NA): 136 meq/L (ref 135–145)

## 2020-04-20 LAB — HX IMMUNOLOGY: HX C-REACTIVE PROTEIN - CRP: 3.3 mg/L (ref 0.00–7.48)

## 2020-04-20 LAB — HX TRANSFUSION

## 2020-04-20 LAB — HX DIABETES: HX GLUCOSE: 101 mg/dL (ref 70–139)

## 2020-04-20 LAB — HX COAGULATION
HX INR PT: 0.9 (ref 0.9–1.3)
HX PROTHROMBIN TIME: 11.1 s (ref 9.7–14.0)
HX PTT: 32.2 s (ref 25.7–35.7)

## 2020-04-20 NOTE — Consults (Signed)
 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    Subjective [name] is a 50F with scleroderma who had an L4-5 TLIF on Dec. 22  with Dr. Dionisio David. She originally had back pain that radiated down her  legs bilaterally. This improved after surgery and she went to rehab where she  was doing well, until toward the end of her stay she started developing  tingling numbness on the bottom of her right foot at the base of the toes.  This then progressed to include the same part of her other foot. After  discharge from the rehab, she began to have similar pain to her preoperative  pain and also said she developed BLE swelling. She denies fevers, bowel and  bladder dysfunction, saddle anesthesia. She says she has some trouble walking  because of the pain. .    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    Subjective [name] is a 50F with scleroderma who had an L4-5 TLIF on Dec. 22  with Dr. Dionisio David. She originally had back pain that radiated down her  legs bilaterally. This improved after surgery and she went to rehab where she  was doing well, until toward the end of her stay she started developing  tingling numbness on the bottom of her right foot at the base of the toes.  This then progressed to include the same part of her other foot. After  discharge from the rehab, she began to have similar pain to her preoperative  pain and also said she developed BLE swelling. She denies fevers, bowel and  bladder dysfunction, saddle anesthesia. She says she has some trouble walking  because of the pain. .    **Allergies**              -  NKDA        **Allergies**              -  NKDA        **Exam**    Comment BP 100/65 P 92 RR 18 SpO 94% RA T 37.2    MS: A&Ox3.    Language: Speech fluent, without dysarthria, paraphasias or word finding  difficulties. Comprehension is intact, able to follow  multistep commands.          CN: II: VFFC    III: PERRL    III, IV, VI: EOMI    VII: face symmetric    VIII: Hearing grossly intact    XI: SCM and Trapezius 5/5    XII: Tongue midline          Motor: Normal bulk and tone    No drift          Right  Left    Delt 5/5  Delt 5/5    Br 5/5  Br 5/5    Tri 5/5  Tri 5/5    HG 5/5  HG 5/5          Ilio 5/5  Ilio 5/5    Quad 5/5  Quad 5/5    Tib Ant 5/5  Tib Ant 5/5    Gastroc 5/5  Gastroc 5/5 (somewhat effort limited)          Sensation:    Intact to LT and pinprick throughout        Reflexes:    No  Babinski's        Coordination:    Able to touch nose with eyes closed    Pain with dorsiflexion bilaterally    Localizes pain to anterior legs, over the shins    BLE leg raise negative    Incisions well healed, no erythema or dehiscence.    **Exam**    Comment BP 100/65 P 92 RR 18 SpO 94% RA T 37.2    MS: A&Ox3.    Language: Speech fluent, without dysarthria, paraphasias or word finding  difficulties. Comprehension is intact, able to follow multistep commands.          CN: II: VFFC    III: PERRL    III, IV, VI: EOMI    VII: face symmetric    VIII: Hearing grossly intact    XI: SCM and Trapezius 5/5    XII: Tongue midline          Motor: Normal bulk and tone    No drift          Right  Left    Delt 5/5  Delt 5/5    Br 5/5  Br 5/5    Tri 5/5  Tri 5/5    HG 5/5  HG 5/5          Ilio 5/5  Ilio 5/5    Quad 5/5  Quad 5/5    Tib Ant 5/5  Tib Ant 5/5    Gastroc 5/5  Gastroc 5/5 (somewhat effort limited)          Sensation:    Intact to LT and pinprick throughout        Reflexes:    No Babinski's        Coordination:    Able to touch nose with eyes closed    Pain with dorsiflexion bilaterally    Localizes pain to anterior legs, over the shins    BLE leg raise negative    Incisions well healed, no erythema or dehiscence.    **Impression and Recommendations**    Neurosurgery Assessment and Plan Comment Ms. Mccowen is a 68F with PMH of  scleroderma who  presents with worsened pain after L4-5 TLIF. She is intact on  strength and sensory exam. Based on her films, this is unlikely an issue with  surgery, but MRI can be obtained to definitively rule out postoperative  problems. Infection unlikely as afebrile and WBC is 5.3. She did mention  having difficulty getting out of bed because of pain and she does have some  BLE pain below the knees, so DVT should also be ruled out. This could be  related to scleroderma or peripheral neuropathy. Will recommend further  medical workup        Recommendations:    - MRI L-spine w/wo contrast    - BLE doppler US    - start 300 TID neurontin    - rheumatology consult    - peripheral neuropathy panel (A1C, B12, folate, vitamin E, ESR/CRP,  cryglobulins, hepatitis profile and LFTs, TSH, extractable nuclear antigen  (ENA), SPEP, anti-MAG)    - pain consult        Discussed with Dr. Crawford Givens ([facility] chief resident)        Andree Elk, MD    [facility] p4000.    **Impression and Recommendations**    Neurosurgery Assessment and Plan Comment [name] is a 68F with PMH of  scleroderma who presents with worsened pain after L4-5 TLIF. She is intact on  strength  and sensory exam. Based on her films, this is unlikely an issue with  surgery, but MRI can be obtained to definitively rule out postoperative  problems. Infection unlikely as afebrile and WBC is 5.3. She did mention  having difficulty getting out of bed because of pain and she does have some  BLE pain below the knees, so DVT should also be ruled out. This could be  related to scleroderma or peripheral neuropathy. Will recommend further  medical workup        Recommendations:    - MRI L-spine w/wo contrast    - BLE doppler US    - start 300 TID neurontin    - rheumatology consult    - peripheral neuropathy panel (A1C, B12, folate, vitamin E, ESR/CRP,  cryglobulins, hepatitis profile and LFTs, TSH, extractable nuclear antigen  (ENA), SPEP, anti-MAG)    - pain consult        Discussed with Dr.  Crawford Givens ([facility] chief resident)        Andree Elk, MD    [facility] p4000.            **Electronically signed by Judie Petit.Andree Elk, MD on 04/20/2020 19:25**        **Electronically signed by Judie Petit.Andree Elk, MD on 04/20/2020 19:25**

## 2020-04-20 NOTE — ED Provider Notes (Signed)
 [name]  Name: [name], [name]  MRN: [id number]  Age: 60 yrs  Sex: Female  DOB: 1961/01/27  Arrival Date: 04/20/2020  Arrival Time: 14:26  Account#: [id number]  .  Working Diagnosis:  - scleroderm, lower extremity edema, paraesthesia  PCP: Chawla, Prianka  .  HPI:  01/18  17:21 This 60 yrs old White Female presents to ER via Walk In with    jb35        complaints of Feet Swelling.  [name]  Historical:  - Allergies: No known drug Allergies;  - Home Meds: Metformin Oral; Lisinopril Oral;  - PMHx: DVT; Diabetes-NIDDM;  - PSHx: Back Surgery; Bilateral Knee Replacements; Shoulder    Surgery; Plate in L Foot;  - Social history: Smoking status: The patient is not a current    smoker. Patient/guardian denies using alcohol, street drugs,    IV drugs.  .  .  Vital Signs:  14:32 BP 100 / 65; Pulse 92; Resp 18; Temp 37.2(O); Pulse Ox 94% on   jt26        R/A;  19:18 BP 85 / 57; Pulse 68; Resp 16; Pulse Ox 96% on R/A;             cc9  20:09 BP 102 / 63; Pulse 72; Resp 18; Pulse Ox 98% on R/A;            kc21  21:19 BP 101 / 70; Pulse 55; Resp 18; Pulse Ox 100% on R/A;           kc21  .  Glasgow Coma Score:  17:36 Eye Response: spontaneous(4). Verbal Response: oriented(5).     meh2        Motor Response: obeys commands(6). Total: 15.  19:18 Eye Response: spontaneous(4). Verbal Response: oriented(5).     cc9        Motor Response: obeys commands(6). Total: 15.  .  MDM:  19:48 Resident chart complete and electronically signed: Billie Ruddy, MD.  .  01/18  14:43 Order name: EDIE_CAREPLAN; Complete Time: 15:01                 dispa  t  01/18  17:44 Order name: Vitamin B12; Complete Time: 19:13                   jb35  01/18  19:13 Interpretation: Abnormal: Vitamin B12 1115.                     jfc1  01/18  17:44 Order name: Folate, Serum; Complete Time: 19:13                 jb35  01/18  .  Name:[name], [name]  [id number]  [id number]  Page 1 of 5  %%PAGE  .  Name: [name], [name]  MRN: [id number]  Age: 83 yrs  Sex: Female  DOB:  07/18/1960  Arrival Date: 04/20/2020  Arrival Time: 14:26  Account#: [id number]  .  Working Diagnosis:  - scleroderm, lower extremity edema, paraesthesia  PCP: Chawla, Prianka  .  19:13 Interpretation: Within normal limits: Folate, Serum 9.3.        jfc1  01/18  17:44 Order name: CBC/Diff (With Plt); Complete Time: 19:13           jb35  01/18  19:13 Interpretation: Abnormal: MCHC 31.3.  jfc1  01/18  17:44 Order name: Blood Urea Nitrogen (Bun); Complete Time: 19:13     jb35  01/18  17:44 Order name: CR (Creatinine); Complete Time: 19:13               jb35  01/18  19:13 Interpretation: Within normal limits: Creatinine 0.70.          jfc1  01/18  17:44 Order name: Electrolytes (Na, K, Cl, Co2); Complete Time: 19:13 jb35  01/18  17:44 Order name: GLU (Glucose); Complete Time: 19:13                 jb35  01/18  17:44 Order name: TSH (Thyroid Stimulating Hormone); Complete Time:   jb35        19:13  01/18  19:13 Interpretation: Abnormal: TSH 5.72.                             jfc1  01/18  18:29 Order name: GFR, AA; Complete Time: 19:13                       dispa  t  01/18  18:29 Order name: GFR, NAA; Complete Time: 19:13                      dispa  t  01/18  18:30 Order name: 2021 eGFRcr; Complete Time: 19:13                   dispa  t  01/18  19:14 Order name: Prothrombin Time; Complete Time: 19:23              dispa  t  01/18  19:14 Order name: PTT; Complete Time: 19:23                           dispa  t  01/18  15:01 Order name: Dx Spine Lumbar Ap + Lateral; Complete Time: 20:25  jra  01/18  15:01 Order name: Vascular Lower Extr Unilateral Duplex               jra  01/18  17:44 Order name: Consult Orders-Neurosurgery, Reserve (Neurosurgery);  jb35        Complete Time: 18:09  01/18  17:58 Order name: ADD PTT NMC; Complete Time: 18:06                   jb35  01/18  17:58 Order name: ADD PT NMC; Complete Time: 18:06                    jb35  01/18  .  Name:[name],  [name]  [id number]  [id number]  Page 2 of 5  %%PAGE  .  Name: [name], [name]  MRN: [id number]  Age: 20 yrs  Sex: Female  DOB: [birth year]  Arrival Date: 04/20/2020  Arrival Time: 14:26  Account#: [id number]  .  Working Diagnosis:  - scleroderm, lower extremity edema, paraesthesia  PCP: Chawla, Prianka  .  19:13 Order name: ADD ESR; Complete Time: 19:16                       jfc1  01/18  19:13 Order name: ADD CRP; Complete Time: 19:16                       jfc1  01/18  19:35 Order name: Blood Bank Hold; Complete Time: 20:25               dispa  t  01/18  20:25 Interpretation: Blood Bank Hold <p>RECD</p>; Noted.             jfc1  01/18  19:38 Order name: Sed Rate; Complete Time: 20:25                      dispa  t  01/18  20:25 Interpretation: Abnormal: Sed Rate 45.                          jfc1  01/18  19:46 Order name: cC-Reactive Protein; Complete Time: 20:25           dispa  t  01/18  20:25 Interpretation: Within normal limits: cC-Reactive Protein 3.30. jfc1  01/18  20:42 Order name: MR SPINE LUMBAR W + WO CONT                         mm10  01/18  21:02 Order name: COVID-19 PCR (Asymptomatic patients)                mm10  .  Dispensed Medications:  19:24 Drug: NS - Sodium Chloride 0.9% IV ml 1000 mL Route: IV; Rate:  kc21        Bolus;  20:29 Drug: oxyCODONE 5 mg Route: PO;                                 kc21  .  [name]  Radiology Orders:  Order Name: Dx Spine Lumbar Ap + Lateral; Last Status:    Reviewed; Time: 04/20/20 15:01; By: jra; For: jra; Order    Method: Electronic  Order Name: Vascular Lower Extr Unilateral Duplex; Last Status:    Ordered; Time: 04/20/20 15:01; By: jra; For: jra; Order    Method: Electronic  Order Name: MR SPINE LUMBAR W + WO CONT; Last Status: Ordered;    Time: 04/20/20 20:42; By: Annie Sable; For: mm10; Order Method:    Electronic; Notes: Bed Name: T3  Attending Notes:  17:56 Attestation: Assessment and care plan reviewed with             mm10/  adr        resident/midlevel provider. See their  note for details.        Resident's history reviewed, patient interviewed and examined.        I have reviewed Nurses Notes.  18:07 Attending HPI: HPI: 60 year old female patient with PMHx of     mm10/  adr  .  Name:[name], [name]  [id number]  [id number]  Page 3 of 5  %%PAGE  .  Name: [name], [name]  MRN: [id number]  Age: 16 yrs  Sex: Female  DOB: Oct 19, 1960  Arrival Date: 04/20/2020  Arrival Time: 14:26  Account#: [id number]  .  Working Diagnosis:  - scleroderm, lower extremity edema, paraesthesia  PCP: Chawla, Prianka  .        scleroderma, IG 4 deficiency, Raynaud's disease, DVT (in 2006),        and diabetes presents for evaluation of leg swelling. Pt states        she had surgery for chronic back pain on December 22 and was        discharged to rehab. She  reports doing well at rehab and was        able to walk with a walker. At rehab she began developing toe        numbness. Now for the past few days she has been experiencing        bilateral lower extremity weakness, numbness, swelling and pain        that begins at her toes and extends to her mid-calf. Pt uses        crutches to ambulate.  18:09 Attending ROS Constitutional: Negative for chills, fever,       mm10/  adr        MS/Extremity: Positive for pain, swelling, tenderness, Neuro:        Positive for tingling, weakness, All other systems are negative.  18:10 Attending Exam: My personal exam reveals Constitutional:        mm10/  adr        Well-developed, well nourished, NAD. Head: NC/AT. Eyes: PERRL,        EOMI. ENT: Normal external nose and ears, posterior OP benign.        Neck: Supple, full ROM, no midline tenderness. Pulmonary:        Unlabored respiratory effort, CTAB. Chest: No chest wall        tenderness. CVS: RRR, normal S1 and S2, no m/r/g. Abdomen:        Soft, nontender, nondistended, normal bowel sounds. Back: No        MLT, no CVAT, full ROM. Normal surgical wound site.        Extremities: Minimal edema, although pt states more swollen         than usual. Normal pulses, no evidence of cellulitis Skin:        Warm, dry, no rashes. Neuro: Awake and alert, lucid mentation,        face symmetric. Cranial nerves II-XII grossly intact. Motor        strength 5/5 in all extremities. Sensory grossly intact.  19:25 Lab/Ancillary show: Labs were reviewed and interpreted by me:   mm10/  adr        TSH 5.72, vitamin B12 1115, sed rate 45.  20:51 ED Course: Patient presents for evaluation of bilateral leg     mm10/  adr        swelling. Neurosurgery was consulted and saw the patient, they        recommend MRI with and without contrast of the lumbar spine and        lower extremity Doppler. Given her symptoms I recommend        admission for further evaluation and management of care.  20:52 My Working Impression: 1. scleroderma 2. paresthesia 3. lower   mm10/  adr        extremity edema.  20:52 Scribe Scribe Chart Complete By signing my name below, Alida    mm10/  adr        Dorene Sorrow attest that this documentation has been prepared        under the direction and in the presence of Dr. Metro Kung, D.O.        Electronically signed Emeline Gins ED Scribe, 04/20/20.  [name]  Disposition Summary:  04/20/20 21:00  Hospitalization Ordered  .  Name:[name], [name]  [id number]  [id number]  Page 4 of 5  %%PAGE  .  Name: [name], [name]  MRN: [id number]  Age: 68 yrs  Sex: Female  DOB: 10-28-1960  Arrival Date: 04/20/2020  Arrival Time: 14:26  Account#: [id number]  .  Working Diagnosis:  - scleroderm, lower extremity edema, paraesthesia  PCP: Chawla, Prianka  .        Hospitalization Status: Inpatient Admission                     mm10        Provider: Basil Dess                                     mm10        Location: Adult Floor                                           mm10        Condition: Stable                                               mm10        Problem: an ongoing problem                                     mm10        Symptoms: have improved                                          mm10        Bed/Room Type: Regular                                          mm10        Room Assignment: [facility] 7(04/20/20 21:19)                        lp7        Diagnosis          - scleroderm, lower extremity edema, paraesthesia             mm10        Forms:          - Handoff Communication Form                                  mm10          - Fax Summary                                                 mm10  Signatures:  Dispatcher, [employer]                          dispa  Erby Pian  PA-C jfc1  Mostofi, Matt                           DO   mm10  Haywood Pao                         BSN  kc21  Powers, Lauren                          BSN  lp7  Sherie Don                         MD   jra  Hubbard Robinson                            MD   jb35  Bing Matter                           RN   gd5  Alida DelRosario, Scribe                Scribadr  Erline Hau                 Sec  dg20  .  Corrections: (The following items were deleted from the chart)  19:55 19:25 Lab/Ancillary show: Labs were reviewed and interpreted by mm10/  adr        me: TSH 5.72, vitamin B12 1115 mm10/adr  21:04 21:00 mm10                                                      lp7  21:19 21:04 *PENDING BED* lp7                                         lp7  .  Document is preliminary until electronically or manually signed by the atte  nding physician  .  .  .  .  .  .  .  .  .  .  [name], [name]  [id number]  [id number]  Page 5 of 5  .  %%END

## 2020-04-20 NOTE — Consults (Signed)
 **Reason for Referral (Palliative Care)**    Team members who saw patient Dr. Oleh Genin, Dr. Rande Lawman.    Reason for Referral Pain management.    **Subjective/HPI (Palliative Care)**    HPI Francisca is a 60 year old female with history of scleroderma, IgG4  deficiency, diabetes, HTN, HLD, GIB, Lumbar stenosis s/p L4-5 TLIF with  recurrance of back pain and new numbness and pain in the bottom of her feet.  Alaysiah underwent a L4-L5 TLIF on December 22 for lumbar stenosis with  spondylolisthesis. Following her surgery she attended rehab and improvement of  her back pain. However, toward the end of her stay at rehab she began  developing some numbness and a dull aching pain at the bottom of her feet.  Then a few days following rehab while at home, she begn developing right lower  back pain similar from prior to her surgery. She also developed bilateral  lower extremity swelling. It has been difficult for her to ambulate due to the  numbness and pain at the bottom of her feet. She is not having any bowel or  bladder incontinence and denying lower extremity weakness.    Subjective Rasa is seen sitting up in bed, quietly reading. She continues to  have pain in her right back that radiates down to her right lateral thigh  above the knee. She also has numbess and pain in the bottom of her feet and  toes, R>L. She endorses bilateral leg swelling into her thighs, also R>L.    Patient Location Medical Surgical Unit.    Tolerating Diet Yes.    **Allergies**              -  NKDA        **Medications**    Medication              -   **GABAPENTIN (NEURONTIN)** 300 MG Oral QHS for 60 Days, Clinician Dir:FOR A TOTAL OF 600 MG AT BEDTIME           -   **TRAZODONE (DESYREL)** 100 MG Oral QHS for 60 Days           -   **PREDNISONE (DELTASONE)** 10 MG Oral QDAY First Dose Now for 60 Days           -   **GABAPENTIN (NEURONTIN)** 300 MG Oral TID for 60 Days           -   **PANTOPRAZOLE DR (PROTONIX)** 40 MG Oral BID for  60 Days, Clinician Dir:IC: OMEPRAZOLE 40 MG BID           -   **MYCOPHENOLATE MOFETIL (CELLCEPT)** 1000 MG Oral BID for 60 Days           -   **HYDROXYCHLOROQUINE (PLAQUENIL)** 200 MG Oral QDAY for 60 Days           -   **PREGABALIN (LYRICA)** 25 MG Oral BID for 60 Days           -   **LISINOPRIL (PRINIVIL)** 2.5 MG Oral QDAY for 60 Days           -   **EMPAGLIFLOZIN (JARDIANCE)** 10 MG Oral QDAY for 60 Days           -   ** ** ** **INSULIN LISPRO (HUMALOG) (HUMALOG)******** Sliding Scale Subcutaneous TIDWM for 60 Days      For Glucose 80 to 150 Give 0 Units    For Glucose 151 to 200 Give 2  Units    For Glucose 201 to 250 Give 3 Units    For Glucose 251 to 300 Give 4 Units    Notify Physician if: Call HO for glucose > 300          -   **FAMOTIDINE (PEPCID)** 20 MG Oral QDAYPRN Indigestion for 60 Days           -   **BACLOFEN (LIORESAL)** 10 MG Oral BIDPRN muscle spasm for 60 Days           -   **OXYCODONE (PERCOLONE)** 10 MG Oral Q6HPRN Pain score 7-10 for 7 Days           -   **HEPARIN** 5000 UNITS Subcutaneous Q8H for 60 Days         **Exam**    Comment Gen: Sitting up in bed, no acute distess    Cardiac: RRR, well perfused, trace pitting edema in LE    Resp: No increased WOB, no accessory muscle use, satting well on RA, CTAB    Extrem: Bilateral toes exquisitly tender to touch. no tenderness in calves or  shins.      Vital Signs    04/22/2020 05:19              -  Temperature: 36.4 (35-37.8Cel)          -  Site: Oral          -  Heart Rate: 75 (60-90)          -  Site: Monitor          -  BP: 97/68 (90-140/60-90)          -  Site: Right Arm          -  Position: Lying          -  Method: Automated          -  Mean Arterial Pressure1: 70 (60)          -  Respirations: 18 (12-20)          -  O2 Saturation (%): 97          -  O2 Delivery Method: Room Air          -  MEWS Vital Sign Score: 0      04/22/2020 00:02              -  Morse Fall Risk Total: 45      04/21/2020  03:09              -  How Obtained: Stand. Scale          -  Weight: 36.9kg          I have reviewed and agree with vital signs as listed in the EMR Yes.    **Labs**    All current labs have been reviewed by myself as of 04/22/2020 09:28.    **Impression and Plan**    Impression and Plan Addilee is a 60 year old female with history of scleroderma,  IgG4 deficiency, diabetes, HTN, HLD, GIB, Lumbar stenosis s/p L4-5 TLIF with  recurrance of back pain and new numbness and pain in the bottom of her feet.  Ashante underwent a L4-L5 TLIF on December 22 for lumbar stenosis with  spondylolisthesis with good improvement of her back pain following rehab.  However, in the last week since she has been home has had recurrance of her  right back pain  and new development of pain and numbness in her bilateral  bottoms of her feet and toes that had caused difficulty with ambulation.  Palliative care was consulted for guidance on pain control.    Goals of Care She expresses desire to figure out what is causing the pain in  order to get back to her progess she made in rehab. She states she has coped  with her medical illnesses because she has to. This back surgery is the first  one that has really taken a toll on her mental health. She was very happy when  she made so much progress in rehab.    Symptom Management Suggestions Anaiz expresses difficulty with her new pain in  her feet as she was doing so well after rehab. She has been on Gabapentin  300mg  TID with extra 300mg  at night to make 600mg  since October. She was  recently prescirbed Lyrica 25mg  BID that has helped more than the Gabapentin.  She was also taking 10mg  Oxycodone 5 days before her surgery. She states that  none of these medications has taken the pain away only dulled the pain  slightly. Since pregabalin and gabapentin work similarly, we recommend  decreasing the daily Gabapentin by 300 mg and going up on the Lyrica to 25mg   TID as this has shown Eriel the most effect  in dulling her pain. We will  continue to follow and make further recomendations on pain management as  diganosis becomes more clear. [name]    Psychosocial/Spiritual needs Describes close friend group that helps her cope  with her medical issues.            **Electronically signed by Rande Lawman, MD on 04/22/2020 09:50**

## 2020-04-20 NOTE — ED Provider Notes (Signed)
 [name]  Name: Moriah, Shawley  MRN: [id number]  Age: 60 yrs  Sex: Female  DOB: 08-29-1960  Arrival Date: 04/20/2020  Arrival Time: 14:26  Account#: [id number]  Bed Pending Adult  PCP: Darrick Penna  Chief Complaint: Feet Swelling  .  Presentation:  01/18  14:29 Method Of Arrival: Walk In                                      mc44  14:31 Presenting complaint: Patient states: Pt states she had back    gd5        surgery December 22nd @ this hospital. Pt states x2.5 weeks ago        while in rehab her toes began going numb. Pt states now both        feet are numb with swelling from feet to thighs for 1 week. Pt        denies incontinence.  14:31 Acuity: Adult 3                                                 gd5  .  Historical:  - Allergies:  14:33 No known drug Allergies;                                        gd5  - Home Meds:  14:35 Metformin Oral [Active]; Lisinopril Oral [Active];              gd5  - PMHx:  14:33 DVT;                                                            gd5  14:35 Diabetes-NIDDM;                                                 gd5  - PSHx:  14:33 Back Surgery; Bilateral Knee Replacements; Shoulder Surgery;    gd5        Plate in L Foot;  .  - Social history: Smoking status: The patient is not a current    smoker. Patient/guardian denies using alcohol, street drugs,    IV drugs.  .  .  Screening:  14:31 Exposure Risk/Travel Screening: COVID Symptomsquestion None. Known     gd5        COVID 19 exposurequestion No. DPH requests Isolationquestion(COVID) No. Have        you tested + for COVIDquestion No. COVID 19 Vaccinequestion Yes-patient        states they completed COVID vaccine recommendations over 2        weeks ago.  .  Vital Signs:  14:32 BP 100 / 65; Pulse 92; Resp 18; Temp 37.2(O); Pulse Ox 94% on   jt26        R/A;  19:18 BP 85 / 57; Pulse 68;  Resp 16; Pulse Ox 96% on R/A;             cc9  20:09 BP 102 / 63; Pulse 72; Resp 18; Pulse Ox 98% on R/A;            kc21  21:19 BP 101 / 70; Pulse 55;  Resp 18; Pulse Ox 100% on R/A;           kc21  .  Glasgow Coma Score:  Merilynn Finland, Tennie  [id number]  [id number]  Page 1 of 3  %%PAGE  .  Name: Dolorez, Jeffrey  MRN: [id number]  Age: 73 yrs  Sex: Female  DOB: Jun 26, 1960  Arrival Date: 04/20/2020  Arrival Time: 14:26  Account#: [id number]  Bed Pending Adult  PCP: Darrick Penna  Chief Complaint: Feet Swelling  .  17:36 Eye Response: spontaneous(4). Verbal Response: oriented(5).     meh2        Motor Response: obeys commands(6). Total: 15.  19:18 Eye Response: spontaneous(4). Verbal Response: oriented(5).     cc9        Motor Response: obeys commands(6). Total: 15.  .  Triage Assessment:  14:35 General: Appears well nourished, well groomed. Pain: Complains  gd5        of pain in right leg. Neuro: Patient reports Numbness. EENT: No        deficits noted. Cardiovascular: No deficits noted. Respiratory:        No deficits noted. Airway is patent Trachea midline Respiratory        effort is even, unlabored, Respiratory pattern is regular,        symmetrical. GI: No deficits noted. GU: No deficits noted.        Skin: No deficits noted. Musculoskeletal: No deficits noted.  .  Assessment:  17:36 General: Appears uncomfortable, Behavior is Denies fever,       meh2        feeling ill, fatigue, chills. Pain: Complains of pain in right        foot, left foot, right leg and left leg Quality of pain is        described as squeezing, tender, tingling, Pain began 2 weeks        ago. Cardiovascular: Edema is 2+ to left lower thigh, left        knee, left midcalf, left ankle, left foot, left toes, right        lower thigh, right knee, right midcalf, right ankle, right foot        and right toes. Respiratory: No deficits noted. GI: No deficits        noted. GU: No deficits noted. Skin: No deficits noted.  19:26 General: Pt noted to be hypotensive. Joe, PA notified. 1L NS    kc21        hung at this time. Will continue to monitor.  20:24 General: Pt requesting pain meds. Joe, PA  notified.             [phone]  21:12 General: MRI screening form faxed at this time.                 kc21  .  Observations:  14:26 Patient arrived in ED.                                          km51  14:33 Triage Completed.  gd5  17:21 Patient Visited By: Hubbard Robinson                                [phone]  17:48 Patient Visited By: Miachel Roux                               mm10  19:01 Patient Visited By: Erby Pian                             jfc1  21:00 Patient Visited By: Miachel Roux                               mm10  21:04 Patient assigned to T3                                          lp7  21:19 Patient assigned to T3                                          lp7  .  Procedure:  17:59 Inserted peripheral IV: 22 gauge in left antecubital area.      meh2  21:11 COVID-19 PCR (Asymptomatic patients) Sent.                      kc21  .  Name:Castronovo, Mistee  [id number]  [phone]  Page 2 of 3  %%PAGE  .  Name: Kiyoko, Mcguirt  MRN: [id number]  Age: 30 yrs  Sex: Female  DOB: 1960/04/06  Arrival Date: 04/20/2020  Arrival Time: 14:26  Account#: [id number]  Bed Pending Adult  PCP: Darrick Penna  Chief Complaint: Feet Swelling  .  [name]  Dispensed Medications:  19:24 Drug: NS - Sodium Chloride 0.9% IV ml 1000 mL Route: IV; Rate:  kc21        Bolus;  20:29 Drug: oxyCODONE 5 mg Route: PO;                                 kc21  .  [name]  Interventions:  14:27 Driver's License Scanned into Chart                             km51  16:34 Other: Vascular Lab Report Scanned into Chart                   sa35  17:34 Demo Sheet Scanned into Chart                                   dg20  19:23 Report given to [employer].                                       meh2  .  Outcome:  21:00 Decision to Hospitalize by Provider.  mm10  21:19 Admitted to [facility] 7 via stretcher, with chart. Chart Status     kc21        Nursing note complete and electronically signed.  21:46 Patient  left the ED.                                            kc21  .  Signatures:  Erby Pian                         PA-C jfc1  Mostofi, Matt                           DO   mm10  Joyce Gross                       BSN  cc9  [place], [place]                        RN   mc44  Haywood Pao                         BSN  Karen Kays, Lauren                          BSN  lp7  Adria Devon                             CCT  jt26  Hubbard Robinson                            MD   jb35  Ocie Cornfield                         RN   meh2  Bing Matter                           RN   gd5  Erline Hau                 Sec  [address], [place]                             km51  Eilleen Kempf                              sa35  .  .  .  .  .  .  .  .  .  Merilynn Finland, [place]  [id number]  [id number]  Page 3 of 3  .  %%END

## 2020-04-20 NOTE — Progress Notes (Signed)
 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.1)  SCREENING EXAM FOR BREAST CANCER (ICD-V76.10) (ICD10-Z12.39)  ANEMIA, OTHER (ICD-285.8) (ICD10-D64.89)  LOWER EXTREMITY EDEMA (ICD-782.3) (ICD10-R60.0)  IRREGULAR HEART BEATS (ICD-427.9) (ICD10-I49.9)  GI BLEED (ICD-578.9) (TDS28-J68.2)  SCLERODERMA (ICD-710.1) (ICD10-M34.9)  SCREENING FOR CERVICAL CANCER (ICD-V76.2) (TLX72-I20.4)  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) (ICD10-M47.812)  NECK PAIN (ICD-723.1) (ICD10-M54.2)  STEROID USE, LONG TERM (ICD-V58.65) (ICD10-Z79.52)  PREMATURE VENTRICULAR CONTRACTIONS (ICD-427.69) (ICD10-I49.3)  HYPERLIPIDEMIA (ICD-272.4) (ICD10-E78.5)  BACK PAIN, LUMBAR, CHRONIC (ICD-724.2) (ICD10-M54.5)  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)  DIABETES MELLITUS, TYPE II (ICD-250.00) (ICD10-E11.9)  LONG-TERM (CURRENT) USE OF STEROIDS (ICD-V58.65) (ICD10-Z79.51)  SKIN SAGGING DUE TO WEIGHT LOSS (ICD-757.9) (ICD10-Q84.9)  TRANSAMINASES, SERUM, ELEVATED (ICD-790.4) (ICD10-R74.0)  OBESITY, BMI 30-34.9, ADULT (ICD-278.00) (ICD10-E66.9)      DIABETES MELLITUS, TYPE II, UNCONTROLLED (ICD-250.02) (ICD10-E11.65)      FATTY LIVER DISEASE (ICD-571.8) (ICD10-K76.0)  SCLERODERMA, 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)  HYPOTHYROIDISM (ICD-244.9) (ICD10-E03.9)  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) (ICD10-D80.8)  SPINAL STENOSIS, 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)  HEPATITIS C EXPOSURE (HCV RNA NEGATIVE,  01/2014) (ICD-V02.62) (ICD10-Z20.5)  PAIN IN JOINT, HAND (ICD-719.44) (ICD10-M79.643)  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)  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)  CARPAL TUNNEL (ICD-354.0) (ICD10-G56.00)  OSTEOARTHRITIS (ICD-715.09) (ICD10-M15.9)  S/P ROTATOR CUFF SURGERY 12/2004 - RT SHOULDER; 2008 LT (ICD-V45.89)  Family Hx of MELANOMA, FAMILY HX (ICD-V16.8) (ICD10-Z80.8)  FRACTURE OF OTHER SPEC SITE,  PATHOLOGIC - MULTIPLE (ICD-733.19)  CHOLECYSTECTOMY AND HERNIA REPAIR (ICD-V45.89)  VITAMIN D DEFICIENCY (ICD-268.9) (ICD10-E55.9)  COLONOSCOPY, NEXT 2020- SEE COMMENT (ICD-V76.51) (ICD10-Z01.89)    Meds (prior to this call):   HYDROXYCHLOROQUINE SULFATE 200 MG ORAL TABLET (HYDROXYCHLOROQUINE SULFATE) one tablet oral daily; Route: ORAL  JARDIANCE 10 MG ORAL TABLET (EMPAGLIFLOZIN) Take 1 tablet by mouth once daily; Route: ORAL  ROSUVASTATIN 10MG  TABLETS (ROSUVASTATIN CALCIUM) TAKE 1 TABLET BY MOUTH EVERY DAY  LISINOPRIL 2.5MG  TABLETS (LISINOPRIL) TAKE 1 TABLET BY MOUTH ONCE DAILY  METFORMIN HCL ER 500 MG XR24H-TAB (METFORMIN HCL) TAKE 4 TABLETS BY MOUTH EVERY MORNING  OMEPRAZOLE 20 MG ORAL CAPSULE DELAYED RELEASE (OMEPRAZOLE) Take one capsule by mouth twice a day; Route: ORAL  DICLOFENAC SODIUM 1 % TRANSDERMAL GEL (DICLOFENAC SODIUM) APP 2 GRAMS EXT AA BID      FREESTYLE FREEDOM LITE w/Device KIT (BLOOD GLUCOSE MONITORING SUPPL) use as directed (ICD 10 E11.65)      FREESTYLE LITE BLOOD GLUCOSE STRIPS (GLUCOSE BLOOD) USE TO CHECK BLOOD GLUCOSE THREE TIMES DAILY      FREESTYLE LANCETS (LANCETS) check fingerstick three times a day (ICD 10 E11.65)  OXYCODONE HCL 5 MG ORAL TABLET (OXYCODONE HCL) Take one tablet daily as needed for pain >8. Partial fill upon patient request.; Route: ORAL  CALCIUM CARBONATE-VITAMIN D 600-200 MG-UNIT ORAL TABLET (CALCIUM CARBONATE-VITAMIN D) take one tablet twice a day; Route: ORAL  * BLOODWORK Please draw chemistry (sodium, potassium, magnesium, chloride, bicarbonate,  glucose, creatinine, BUN), CBC, TSH, lipids, HbA1c. Fax result to [phone] Dr. Marlane Hatcher  LIDOCAINE 5 % PTCH (LIDOCAINE) APPLY ONE PATCH TO BACK ONCE DAILY AS NEEDED FOR PAIN, REMOVE AFTER 12 HRS  MYCOPHENOLATE MOFETIL 500 MG ORAL TABLET (  MYCOPHENOLATE MOFETIL) Take 2 tablets twice daily; Route: ORAL  GAMMAGARD 20 GM/200ML INJECTION SOLUTION (IMMUNE GLOBULIN (HUMAN)) Receives infusion every 4 weeks, next dose 8/5; Route: INJECTION  URSODIOL 500 MG ORAL TABLET (URSODIOL) Take 1 tablet twice daily; Route: ORAL  * WHEEL CHAIR WITH FOOT PEDALS USE AS INSTRUCTED HT 62.5 WT 158.6 M54.5  M48.06  * SHOWER CHAIR USE AS INSTRUCTED  HT 62.5 WT 158.9 E11.9 -- M54.5 -- M89.9  * RAISED TOILET SEAT   * LOFT STAND CRUTCHES USE AS INSTRUCTED  HT 62.5 WT 158.6 M54.4 -- R60.0 -- M89.9  BACTRIM DS 800-160 MG ORAL TABLET (SULFAMETHOXAZOLE-TRIMETHOPRIM) Take one tablet by mouth twice daily for 7 days; Route: ORAL  famotidine 20 mg tablet (famotidine) TAKE 1 TABLET BY MOUTH AT NIGHT AS NEEDED  trazodone 100 mg tablet (trazodone) Take 1 tablet by mouth every night   baclofen 10 mg tablet (baclofen) Take 1 tablet by mouth twice a day as needed for back spasms  gabapentin 300 mg capsule (gabapentin) Take 2 capsule by mouth three times a day as needed for pain. This is a dose increase s/p back surgery done on 03/24/20. Okay to refill at this time. 30 day supply with plan to taper.          Services Due:     Items recorded during this note:            Immunization Worksheet 2019 (rev 07/09/2019)         Save Prior PCP  This module is to save a prior PCP, to assist with future appointments. Also to help set Care Manager.           Created By [name] -PC 4B- on 04/20/2020 at 02:17 PM    Electronically Signed By [name] -PC 4B- on 04/20/2020 at 02:19 PM

## 2020-04-20 NOTE — Progress Notes (Signed)
 Supervisory Note For Resident I performed a history and physical examination  of the patient and discussed the management with the resident. I reviewed the  resident's note and agree with the documented findings and plan of care. Yes,  Additional Notes This is an Initial Palliative Care consult for pain in  setting of high medical complexity. We addressed both pain - see  recommendations above for slow titration of gabapentin (down) for pregabalin  (up) and illness adjustment. Pt has had understandable challenges coping wiht  functional limiations imposed by her illness, reports support of friends has  helped. Improved pain control should improve function as well as illness  adjustment. .            **Electronically signed by Oleh Genin, MD on 04/22/2020 10:11**

## 2020-04-20 NOTE — Consults (Signed)
 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 Rheumatology has been consulted for further  workup and management of her pain.        The patient has been on cellcept since at least 07/2019, plaquenil for years,  and was on rituximab for inflammatory arthritis reportedly years ago. The  patient has no known heart or lung involvement of her scleroderma. She was  also briefly on a 5 day course of prednisone prior to her recent surgery for  worsening synovitis in her hands which helped relieve her symptoms.          After her surgery, patient was discharged to rehab and was doing well. She  reports that she progressed to walking with a walker and no longer had any  back pain. She also had no pain in her feet at this time. About two and a half  weeks ago while still at rehab she noticed that her toes started to become  numb. This then progressed to numbness involving the entire bottom of her foot  with accompanied pain. She noticed pain in all of her toes and in the ball of  her foot starting last Wednesday 1/12. The pain worsened to the point that she  no longer was able to walk. She presented to an outside ER on Friday and got X  rays of her lumbar spine which were reportedly stable from prior. The pain  continued to be severe so she presented to the ED here for further evaluation.  She has never had foot pain or numbness like this in the past. In addition to  her bilateral severe foot/toe pain and numbness she also  has mild pain in her  back near the site of her surgery and pain down her right leg which is similar  to prior pain before her surgery. She has continued to take her home MMF and  hydroxychloroquine without any issues. She also reports that her hands have  become more swollen over the past week and are now as bad as they were prior  to her most recent steroid course. No other joint pain. No rashes. No chest  pain or shortness of breath.  She feels like lyrica has been helping take the  edge off her pain and the oxycodone makes her sleepy but does not help with  her pain all that much.        Home medications reviewed.        **Objective**    VS reviewed    Gen: NAD, answering questions appropriately    HEENT: NCAT, EOMI    CV: RRR    Pulm: B/l CTA    Abd: Soft, NT, ND    Skin: Sclerodactyly appreciated in distal fingers (namely between PIPs and  DIPs).    MSK: right and left MCP synovitis, with swelling and pain on palpation.  Bilateral PIP synovitis also appreciated. Unable to make a fist bilaterally.  Bilateral feet with numbness on the bottom of both feet. Lower  legs with some  non pitting edema. All toes on bilateral feet exquisitely tender to palpation  with some swelling of the toes.          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 Rheumatology has been  consulted for further  workup and management of her pain.        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 Rheumatology has been consulted for further  workup and management of her pain.        The patient has been on cellcept since at least 07/2019, plaquenil for years,  and was on rituximab for inflammatory arthritis reportedly years ago. The  patient has no known heart or lung involvement of her scleroderma. She was  also briefly on a 5 day course of prednisone prior to her recent surgery for  worsening synovitis in her hands which helped relieve her symptoms.          After her surgery, patient was discharged to rehab and was doing well. She  reports that she progressed to walking with a walker and no longer had any  back pain. She also had no pain in her feet at this time. About two and a half  weeks ago while still at rehab she noticed that her toes started to become  numb. This then progressed to numbness involving the entire bottom of her foot  with accompanied pain. She noticed pain in all of her toes and in the ball of  her foot starting last Wednesday 1/12. The pain worsened to the point that she  no longer was able to walk. She presented to an outside ER on Friday and got X  rays of her lumbar spine which were reportedly stable from prior. The pain  continued to be severe so she presented to the ED here for further evaluation.  She has never had foot  pain or numbness like this in the past. In addition to  her bilateral severe foot/toe pain and numbness she also has mild pain in her  back near the site of her surgery and pain down her right leg which is similar  to prior pain before her surgery. She  has continued to take her home MMF and  hydroxychloroquine without any issues. She also reports that her hands have  become more swollen over the past week and are now as bad as they were prior  to her most recent steroid course. No other joint pain. No rashes. No chest  pain or shortness of breath.  She feels like lyrica has been helping take the  edge off her pain and the oxycodone makes her sleepy but does not help with  her pain all that much.        Home medications reviewed.        **Objective**    VS reviewed    Gen: NAD, answering questions appropriately    HEENT: NCAT, EOMI    CV: RRR    Pulm: B/l CTA    Abd: Soft, NT, ND    Skin: Sclerodactyly appreciated in distal fingers (namely between PIPs and  DIPs).    MSK: right and left MCP synovitis, with swelling and pain on palpation.  Bilateral PIP synovitis also appreciated. Unable to make a fist bilaterally.  Bilateral feet with numbness on the bottom of both feet. Lower legs with some  non pitting edema. All toes on bilateral feet exquisitely tender to palpation  with some swelling of the toes.          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 Rheumatology has been consulted for further  workup and management of her pain.        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 Rheumatology has been consulted for further  workup and management of her pain.        The patient has been on cellcept since at least 07/2019, plaquenil for years,  and was on rituximab for inflammatory arthritis reportedly years ago. The  patient has no known heart or lung involvement of her scleroderma. She was  also briefly on a 5 day course of prednisone prior to her recent surgery for  worsening synovitis in her hands which helped relieve her symptoms.          After her surgery, patient was discharged to rehab and was doing well. She  reports that she progressed to walking with a walker and no longer had any  back pain. She also had no pain in her feet at this time. About two and a half  weeks ago while still at rehab she noticed that her toes started to become  numb. This then progressed to numbness involving the entire bottom of her foot  with accompanied pain. She noticed pain in all of her toes and in the ball of  her foot starting last Wednesday 1/12. The pain worsened to the point that she  no longer was able to walk. She presented to an outside ER on Friday and got X  rays of her lumbar spine which were reportedly stable from  prior. The pain  continued to be severe so she presented to the ED here for further evaluation.  She has never had foot pain or numbness like this in the past. The pain is  difficult for her to describe she states that is it not sharp and not a  burning pain. She reports that it is a different pain than her usual arthritis  pain. In addition to her bilateral severe foot/toe pain and numbness she also  has mild pain in her back near the site of her surgery and pain down her right  leg which is similar to prior pain before her surgery. She has continued to  take her home MMF and hydroxychloroquine without any issues. She also reports  that her hands have become more swollen over the past week and are now as bad  as they were prior to her most recent steroid course. No other joint pain. No  rashes. No chest pain or shortness of breath.  She feels like lyrica has been  helping take the edge off her pain and the oxycodone makes her sleepy but does  not help with her pain all that much.        Home medications reviewed.        **Objective**    VS reviewed    Gen: NAD, answering questions appropriately    HEENT: NCAT, EOMI    CV: RRR    Pulm: B/l CTA    Abd: Soft, NT, ND    Skin: Sclerodactyly appreciated in distal fingers (namely between PIPs and  DIPs).    MSK: right and left MCP synovitis, with swelling and pain on palpation.  Bilateral PIP synovitis also appreciated. Unable to make a fist bilaterally.  Bilateral feet with numbness on the bottom of both feet. Lower legs with some  non pitting edema. All toes on bilateral feet exquisitely tender to palpation,  no obvious joint swelling in her toes.          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 Rheumatology has been consulted  for further  workup of her pain. Her foot pain is in an odd distribution affecting her  bilateral toes and the bottom of her foot, and she reports that it feels  different than her usual arthritis pain. She has no evidence of arthritis in  her toes on exam so overall her pain seems less likely related to any  arthritis from her scleroderma. Scleroderma can sometimes affect peripheral  nerves causing a neuropathy although the timing of her presentation would be  unusual. Given the swelling and pain in her hands and previous improvement  with steroids will trial prednisone to see if it helps her hands.        -recommend prednisone 10mg  daily for the swelling and pain in her hands, course to be determined. The steroids will also provide a therapeutic challenge to see if this also improves her foot pain and would offer evidence this could be related to her underlying scleroderma      -appreciate [facility] involvement     -agree with MRI lumbar spine         We will continue to follow.        Case discussed with Dr. Servando Salina.        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 Rheumatology has been consulted for further  workup and management of her pain.        The patient has been on cellcept since at least 07/2019, plaquenil for years,  and was on rituximab for inflammatory arthritis reportedly years ago. The  patient has no known heart or lung involvement of her scleroderma. She was  also briefly on a 5 day course of prednisone prior to her recent surgery for  worsening synovitis in her hands which helped relieve her symptoms.          After her surgery, patient was discharged to rehab and was doing well. She  reports that she progressed to walking with a walker and no longer had any  back pain. She also had no pain in her feet at this time. About two and a half  weeks ago while still at rehab she noticed that her toes started to become  numb. This then progressed to numbness involving the entire bottom of her foot  with accompanied pain. She noticed pain in all of her toes and in the ball of  her foot starting last Wednesday 1/12. The pain worsened to the point that she  no longer was able to walk. She presented to an outside ER on Friday and got X  rays of her lumbar spine which were reportedly stable from prior. The pain  continued to be severe so she presented to the ED here for further evaluation.  She has never had foot pain or numbness like this in the past. In addition to  her bilateral severe foot/toe pain and numbness she  also has mild pain in her  back near the site of her surgery and pain down her right leg which is similar  to prior pain before her surgery. She has continued to take her home MMF and  hydroxychloroquine without any issues. She also reports that her hands have  become more swollen over the past week and are now as bad as they were prior  to her most recent steroid course. No other joint pain. No rashes. No chest  pain or shortness of  breath.  She feels like lyrica has been helping take the  edge off her pain and the oxycodone makes her sleepy but does not help with  her pain all that much.        Home medications reviewed.        **Objective**    VS reviewed    Gen: NAD, answering questions appropriately    HEENT: NCAT, EOMI    CV: RRR    Pulm: B/l CTA    Abd: Soft, NT, ND    Skin: Sclerodactyly appreciated in distal fingers (namely between PIPs and  DIPs).    MSK: right and left MCP synovitis, with swelling and pain on palpation.  Bilateral PIP synovitis also appreciated. Unable to make a fist bilaterally.  Bilateral feet with numbness on the bottom of both feet. Lower legs with some  non pitting edema. All toes on bilateral feet exquisitely tender to palpation  with some swelling of the toes.          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 Rheumatology has been consulted for further  workup and management of her pain.        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 Rheumatology has been consulted for further  workup and management of her pain.        The patient has been on cellcept since at least 07/2019, plaquenil for years,  and was on rituximab for inflammatory arthritis reportedly years ago. The  patient has no known heart or lung involvement of her scleroderma. She was  also briefly on a 5 day course of prednisone prior to her recent surgery for  worsening  synovitis in her hands which helped relieve her symptoms.          After her surgery, patient was discharged to rehab and was doing well. She  reports that she progressed to walking with a walker and no longer had any  back pain. She also had no pain in her feet at this time. About two and a half  weeks ago while still at rehab she noticed that her toes started to become  numb. This then progressed to numbness involving the entire bottom of her foot  with accompanied pain. She noticed pain in all of her toes and in the ball of  her foot starting last Wednesday 1/12. The pain worsened to the point that she  no longer was able to walk. She presented to an outside ER on Friday and got X  rays of her lumbar spine which were reportedly stable from prior. The pain  continued to be severe so she presented to the ED here for further evaluation.  She has never had foot pain or numbness like this in the past. In addition to  her bilateral severe foot/toe pain and numbness she also has mild pain in her  back near the site of her surgery and pain down her right leg which is similar  to prior pain before her surgery. She has continued to take her home MMF and  hydroxychloroquine without any issues. She also reports that her hands have  become more swollen over the past week and are now as bad as they  were prior  to her most recent steroid course. No other joint pain. No rashes. No chest  pain or shortness of breath.  She feels like lyrica has been helping take the  edge off her pain and the oxycodone makes her sleepy but does not help with  her pain all that much.        Home medications reviewed.        **Objective**    VS reviewed    Gen: NAD, answering questions appropriately    HEENT: NCAT, EOMI    CV: RRR    Pulm: B/l CTA    Abd: Soft, NT, ND    Skin: Sclerodactyly appreciated in distal fingers (namely between PIPs and  DIPs).    MSK: right and left MCP synovitis, with swelling and pain on palpation.  Bilateral PIP synovitis also appreciated. Unable to make a fist bilaterally.  Bilateral feet with numbness on the bottom of both feet. Lower legs with some  non pitting edema. All toes on bilateral feet exquisitely tender to palpation  with some swelling of the toes.          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 Rheumatology has been consulted for further  workup and  management of her pain.        ---note in progress, recs to follow---                    **In Progress, created by Jamesetta So, MD on 04/21/2020 14:22**        **In Progress, created by Jamesetta So, MD on 04/21/2020 14:22**        **Electronically signed by Jamesetta So, MD on  04/21/2020 18:08**        **In Progress, created by Jamesetta So, MD on 04/21/2020 14:22**        **In Progress, created by Jamesetta So, MD on 04/21/2020 14:22**

## 2020-04-20 NOTE — Progress Notes (Signed)
 **Subjective**    Subjective NAOE. MRI L-spine and LENIS pending. Patient did not sleep last  night due to hospital noise. Continues to complain of pain in bilateral feet.    **Exam**    Comment AFVSS    Gen: Sitting up in bed, NAD    HEENT: NC/ AT    Cardiac: RRR    Resp: No increased WOB, no accessory muscle use, satting well on RA    Neuro:    Bilateral toes exquisitly tender to touch    5/5 strength in bilateral IPs, quads. Activates TAs and gastroc bilaterally  (strength assessment limited by pain)    Moves bilateral UEs against gravity    Back incision well healed.      Vital Signs    04/21/2020 06:06              -  Temperature: 36.6 (35-37.8Cel)          -  Site: Oral          -  Heart Rate: 78 (60-90)          -  Site: Monitor          -  BP: 92/68 (90-140/60-90)          -  Site: Right Arm          -  Position: Lying          -  Method: Automated          -  Respirations: 18 (12-20)          -  O2 Saturation (%): 100          -  O2 Delivery Method: Room Air          -  MEWS Vital Sign Score: 0      04/21/2020 03:09              -  How Obtained: Stand. Scale          -  Weight: 36.9kg          -  Morse Fall Risk Total: 70      04/20/2020 23:01              -  Mean Arterial Pressure1: 74 (60)        **Assessment and Plan**    Medication              -   **TRAZODONE (DESYREL)** 100 MG Oral QHS for 60 Days   Pending Date: 04/21/2020          -   **GABAPENTIN (NEURONTIN)** 300 MG Oral QHS for 60 Days, Clinician Dir:FOR A TOTAL OF 600 MG AT BEDTIME   Pending Date: 04/21/2020          -   **GABAPENTIN (NEURONTIN)** 300 MG Oral TID for 60 Days           -   **LISINOPRIL (PRINIVIL)** 2.5 MG Oral QDAY for 60 Days           -   **PANTOPRAZOLE DR (PROTONIX)** 40 MG Oral BID for 60 Days, Clinician Dir:IC: OMEPRAZOLE 40 MG BID           -   **HYDROXYCHLOROQUINE (PLAQUENIL)** 200 MG Oral QDAY for 60 Days           -   **EMPAGLIFLOZIN (JARDIANCE)** 10 MG Oral QDAY for 60 Days            -   **PREGABALIN (  LYRICA)** 25 MG Oral BID for 60 Days           -   **MYCOPHENOLATE MOFETIL (CELLCEPT)** 1000 MG Oral BID for 60 Days           -   ** ** ** **INSULIN LISPRO (HUMALOG) (HUMALOG)******** Sliding Scale Subcutaneous TIDWM for 60 Days      For Glucose 80 to 150 Give 0 Units    For Glucose 151 to 200 Give 2 Units    For Glucose 201 to 250 Give 3 Units    For Glucose 251 to 300 Give 4 Units    Notify Physician if: Call HO for glucose > 300          -   **BACLOFEN (LIORESAL)** 10 MG Oral BIDPRN muscle spasm for 60 Days           -   **FAMOTIDINE (PEPCID)** 20 MG Oral QDAYPRN Indigestion for 60 Days           -   **OXYCODONE (PERCOLONE)** 10 MG Oral Q6HPRN Pain score 7-10 for 7 Days           -   **HEPARIN** 5000 UNITS Subcutaneous Q8H for 60 Days           Neurosurgery Assessment and Plan Comment Data Reviewed:    ALK: 229, LFTs otherwise nl    LDH: 295    TSH: 5.72    FT4: 0.72    T3: 79    Folate: 9.3    B12: 1115    Sed rate: 45    CRP: 3.3        Ms. [name] is a 75F with PMH of scleroderma who presents with worsened pain  after L4-5 TLIF. She is intact on strength and sensory exam. Based on her  films, this is unlikely an issue with surgery, but MRI can be obtained to  definitively rule out postoperative problems. No leukocytosis this AM. She did  mention having difficulty getting out of bed because of pain and she does have  some BLE pain below the knees, so DVT should also be ruled out. This could be  related to scleroderma or peripheral neuropathy. Continue to recommend further  medical workup.        Recommendations:    - MRI L-spine w/wo contrast pending    - BLE doppler US pending    - Continue 300 TID neurontin    - Rheumatology consult    - Peripheral neuropathy panel    --- remaining panel to be completed: A1C, vitamin E, cryglobulins, hepatitis  profile, extractable nuclear antigen (ENA), SPEP, anti-MAG    - Pain consult        Discussed with neurosurgery chief Alfredo [name], PGY7        --    Rogelio Seen, PGY1    Neurosurgery, [id number].            **Electronically signed by Rogelio Seen, MD on 04/21/2020 09:12**

## 2020-04-20 NOTE — Progress Notes (Signed)
 **Subjective**    Subjective ON: Admitted, neurosurgery consulted        This AM    Reports ongoing pain of bilateral feet, worse over the plantar aspect and  lateral aspect of foot). This pain is completely new in the last few days.  Burning in sensation and distinct from her radicular leg pain (this is a  chronic pain had initially resolved after surgery but came back a few days  ago).    **Exam**    Comment Gen: Sitting up in bed, NAD    HEENT: NC/ AT    Cardiac: RRR    Resp: No increased WOB, no accessory muscle use, satting well on RA, CTAB    Neuro: 5/5 strength in bilateral IPs, quads. Activates TAs and gastroc  bilaterally (strength assessment limited by pain)    Moves bilateral UEs against gravity    Extrem: Bilateral toes exquisitly tender to touch. Dime sized area of mild  erythema over LLE great toe MTP, over LLE 3rd IP joint.      Vital Signs    04/21/2020 06:06              -  Temperature: 36.6 (35-37.8Cel)          -  Site: Oral          -  Heart Rate: 78 (60-90)          -  Site: Monitor          -  BP: 92/68 (90-140/60-90)          -  Site: Right Arm          -  Position: Lying          -  Method: Automated          -  Respirations: 18 (12-20)          -  O2 Saturation (%): 100          -  O2 Delivery Method: Room Air          -  MEWS Vital Sign Score: 0      04/21/2020 03:09              -  How Obtained: Stand. Scale          -  Weight: 36.9kg          -  Morse Fall Risk Total: 70      04/20/2020 23:01              -  Mean Arterial Pressure1: 74 (60)          I have reviewed and agree with vital signs as listed in the EMR Yes Time  04/21/2020 10:28.    **Labs**    All current labs have been reviewed by myself as of 04/21/2020 10:28.    **Radiology**    All current radiographs have been reviewed by myself as of 04/21/2020 10:28.    **Assessment and Plan**    Medication              -   **TRAZODONE (DESYREL)** 100 MG Oral QHS for 60 Days   Pending Date: 04/21/2020           -   **GABAPENTIN (NEURONTIN)** 300 MG Oral QHS for 60 Days, Clinician Dir:FOR A TOTAL OF 600 MG AT BEDTIME   Pending Date: 04/21/2020          -   **GABAPENTIN (NEURONTIN)** 300 MG  Oral TID for 60 Days           -   **LISINOPRIL (PRINIVIL)** 2.5 MG Oral QDAY for 60 Days           -   **PANTOPRAZOLE DR (PROTONIX)** 40 MG Oral BID for 60 Days, Clinician Dir:IC: OMEPRAZOLE 40 MG BID           -   **HYDROXYCHLOROQUINE (PLAQUENIL)** 200 MG Oral QDAY for 60 Days           -   **EMPAGLIFLOZIN (JARDIANCE)** 10 MG Oral QDAY for 60 Days           -   **PREGABALIN (LYRICA)** 25 MG Oral BID for 60 Days           -   **MYCOPHENOLATE MOFETIL (CELLCEPT)** 1000 MG Oral BID for 60 Days           -   ** ** ** **INSULIN LISPRO (HUMALOG) (HUMALOG)******** Sliding Scale Subcutaneous TIDWM for 60 Days      For Glucose 80 to 150 Give 0 Units    For Glucose 151 to 200 Give 2 Units    For Glucose 201 to 250 Give 3 Units    For Glucose 251 to 300 Give 4 Units    Notify Physician if: Call HO for glucose > 300          -   **BACLOFEN (LIORESAL)** 10 MG Oral BIDPRN muscle spasm for 60 Days           -   **FAMOTIDINE (PEPCID)** 20 MG Oral QDAYPRN Indigestion for 60 Days           -   **OXYCODONE (PERCOLONE)** 10 MG Oral Q6HPRN Pain score 7-10 for 7 Days           -   **HEPARIN** 5000 UNITS Subcutaneous Q8H for 60 Days           Comment Jaiah Weigel is a 60 year old female with history of scleroderma,  IgG4 deficiency, diabetes, HTN, HLD, GIB, Lumbar stenosis s/p L4-5 TLIF on  03/24/20 who is presenting to the ED with numbness and pain in the bottom of  her feet.        #Foot pain / numbess    Distinct pain from lumbar radiculopathy (see below). Pain for several days  prior to admission. Worse over the balls of her feet/plantar/lateral aspect  and this has been limiting ambulation. This is concerning for rheumatologic  etiology given distribution of pain and exam. Her TSH is mildly elevated but  T3/T4 wnl  representing subclinical hypothyroidism and unlikely to be related  to her current symptoms. Vitamin B12 and folate wnl. No clear cause of her  symptoms at this time.    -rheum c/s appreciate recs    -will send labs for rheumatologic evaluation of neuropathy- SPEP, UPEP, cryo, etc. with guidance from rheumatology    -palliative care c/s as below for pain    -cont gabapentin and pregabalin        #Lower back pain    Unclear etiology but developed about one month post-operatively from L4-L5  TLIF. The patient has had difficulty walking recently because of her bilateral  foot pain/numbess, though this appears to be separate new pain from her  chronic sciatic pain (see above). Chronic lumbar radiculopathy has improved  since surgery but returned a few days prior to admission. No fever, loss of  sensation, incontinence or retention concerning for neurosurgical emergency    -  f/u MRI lumbar spine    - Nusu following, appreciate recs    - continue home oxycodone    - continue home gabapentin and pre-gabalin    - will likely need rehab placement    -palliative care c/s given chronic pain; appreciate recs        #Subclinical Hypothyroidism    Pt has hx of hypothyroidism and was on Synthroid at one point but was since  discontinued. TSH was mildly elevated at 5.72 but free t3 and total t4 wnl.    - no treatment indicated at this time        #Rheumatoid Arthritis    - continue home mycophenolate    - continue home hydroxychloroquine        #T2D    - hold home metformin    - continue home Jardiance    - ISS Lispro        FULL CODE    PIV    DVT: sub q heparin    Diet: diabetic        Minette Headland PGY1 [phone].            **Electronically signed by Minette Headland, MD on 04/21/2020 10:40**

## 2020-04-20 NOTE — Progress Notes (Signed)
 **Subjective/HPI (Palliative Care)**    HPI Maddox is a 60 year old female with history of scleroderma, IgG4  deficiency, diabetes, HTN, HLD, GIB, Lumbar stenosis s/p L4-5 TLIF with  recurrance of back pain and new numbness and pain in the bottom of her feet.  Lonya underwent a L4-L5 TLIF on December 22 for lumbar stenosis with  spondylolisthesis. Following her surgery she attended rehab and improvement of  her back pain. However, toward the end of her stay at rehab she began  developing some numbness and a dull aching pain at the bottom of her feet.  Then a few days following rehab while at home, she begn developing right lower  back pain similar from prior to her surgery. She also developed bilateral  lower extremity swelling. It has been difficult for her to ambulate due to the  numbness and pain at the bottom of her feet. She is not having any bowel or  bladder incontinence and denying lower extremity weakness.    Subjective Tahiri is seen in bed, wokring with OT. She reports pain in her feet  is better - 7/10 in her toes. She also notes an improvement in the swelling in  her legs. "I can see the wrinkles again". Feels the steroids may be helping  her finger joints a bit.    .    Patient Location [facility].    Tolerating Diet Yes.    **Allergies**              -  NKDA        **Medications**    Medication              -   **GABAPENTIN (NEURONTIN)** 200 MG Oral TID for 60 Days   Pending Date: 04/22/2020          -   **PREGABALIN (LYRICA)** 25 MG Oral TID for 59 Days   Pending Date: 04/22/2020          -   **TRAZODONE (DESYREL)** 100 MG Oral QHS for 60 Days           -   **PREDNISONE (DELTASONE)** 10 MG Oral QDAY First Dose Now for 60 Days           -   **HYDROXYCHLOROQUINE (PLAQUENIL)** 200 MG Oral QDAY for 60 Days           -   **LISINOPRIL (PRINIVIL)** 2.5 MG Oral QDAY for 60 Days           -   **PANTOPRAZOLE DR (PROTONIX)** 40 MG Oral BID for 60 Days, Clinician Dir:IC: OMEPRAZOLE 40  MG BID           -   **EMPAGLIFLOZIN (JARDIANCE)** 10 MG Oral QDAY for 60 Days           -   **MYCOPHENOLATE MOFETIL (CELLCEPT)** 1000 MG Oral BID for 60 Days           -   ** ** ** **INSULIN LISPRO (HUMALOG) (HUMALOG)******** Sliding Scale Subcutaneous TIDWM for 60 Days      For Glucose 80 to 150 Give 0 Units    For Glucose 151 to 200 Give 2 Units    For Glucose 201 to 250 Give 3 Units    For Glucose 251 to 300 Give 4 Units    Notify Physician if: Call HO for glucose > 300          -   **BACLOFEN (LIORESAL)** 10 MG Oral BIDPRN muscle  spasm for 60 Days           -   **FAMOTIDINE (PEPCID)** 20 MG Oral QDAYPRN Indigestion for 60 Days           -   **OXYCODONE (PERCOLONE)** 10 MG Oral Q6HPRN Pain score 7-10 for 7 Days           -   **HEPARIN** 5000 UNITS Subcutaneous Q8H for 60 Days         **Exam**    Comment    Gen: Awake, alert, attentive. Normal affect and cognition. Speech clear and  fluent.    L: No respiratory distress or increased work of breathing.    MSK: enlarged joints on PIP and DIP bilaterally    Ext: less edema; allowing more touch of the feet    skin: bronze coloring    .    General Appearance Well-Developed and Well-Nourished, Not in Acute Distress.      Vital Signs    04/22/2020 05:19              -  Temperature: 36.4 (35-37.8Cel)          -  Site: Oral          -  Heart Rate: 75 (60-90)          -  Site: Monitor          -  BP: 97/68 (90-140/60-90)          -  Site: Right Arm          -  Position: Lying          -  Method: Automated          -  Mean Arterial Pressure1: 70 (60)          -  Respirations: 18 (12-20)          -  O2 Saturation (%): 97          -  O2 Delivery Method: Room Air          -  MEWS Vital Sign Score: 0      04/22/2020 00:02              -  Morse Fall Risk Total: 45      04/21/2020 03:09              -  How Obtained: Stand. Scale          -  Weight: 36.9kg          I have reviewed and agree with vital signs as listed in the EMR Yes  Time  04/22/2020 13:08.    **Impression and Plan**    Impression and Plan Nyna is a 60 year old female with history of scleroderma,  IgG4 deficiency, diabetes, HTN, HLD, GIB, Lumbar stenosis s/p L4-5 TLIF with  recurrance of back pain and new numbness and pain in the bottom of her feet.  Ticia underwent a L4-L5 TLIF on December 22 for lumbar stenosis with  spondylolisthesis with good improvement of her back pain following rehab.  However, in the last week since she has been home has had recurrance of her  right back pain and new development of pain and numbness in her bilateral  bottoms of her feet and toes that had caused difficulty with ambulation.  Palliative care was consulted for guidance on pain control.    Goals of Care She expresses desire to figure out what is causing the  pain in  order to get back to her progess she made in rehab. She states she has coped  with her medical illnesses because she has to. This back surgery is the first  one that has really taken a toll on her mental health. She was very happy when  she made so much progress in rehab.    Symptom Management Suggestions FOOT PAIN: Description is c/w neuropathic  nature of pain. Fortunately, MRI shows no new compression, prior proceude has  held up. She continues to have severe pain (7/10) and numbness. Per her  report, pregabalin has more impact on her pain thn gabapentin, so we will work  to wean gabapentin and increase the pregabalin to a more therapeutic dose.  this is usually a slow process.    - continue pregabalin 45m po tid (increased on 1/19)    - continue gabapentin 300mg  po tid (decreased from 300/300/600 on 1/19    - continue to allow oxycodone 5-10mg  po q 4h prn severe pain. consider using  more attempts on walking on her feet.        - can continue baclofen for spasm PRN. Foot pain is not currently described  as a spasm.        EDEMA: Fluid in legs has decreased.    - will continue to monitor as fluid overload could definitely  exacerbate her  pain.    - diuretics as needed    - increase activity as tolerated.        ILLNESS ADJUSTMENT: Pt feels she is coping 'because I have to".    - provided validation for challenges she has faced and encouragement that  pain can be better controlled to allow her better function (now pain is  affecting her ability to walk). .    Psychosocial/Spiritual needs Describes close friend group that helps her cope  with her medical issues.            **Electronically signed by Oleh Genin, MD on 04/22/2020 13:22**

## 2020-04-20 NOTE — H&P (Signed)
 **COVID-19 Positive/Negative**    COVID-19 Positive or Negative? COVID non-suspect (test negative or pending).    **COVID-19 Positive/Negative**    COVID-19 Positive or Negative? COVID non-suspect (test negative or pending).    **Chief Complaint / HPI**    Chief Complaint Numbness.    HPI [name] is a 60 year old female with history of scleroderma, IgG4  deficiency, diabetes, HTN, HLD, GIB, Lumbar stenosis s/p L4-5 TLIF on 03/24/20  who is presenting to the ED with numbness and pain in the bottom of her feet.        The patient underwent a L4-L5 TLIF on December 22 for lumbar stenosis with  spondylolisthesis. Following her surgery she was discharged to rehab. She said  she had been doing well at rehab and her back pain had improved. However,  toward the end of her stay at rehab she began developing some numbness and a  dull aching pain at the bottom of her feet. Then a few days following rehab  while at home, she begn developing right lower back pain similar from prior to  her surgery. She also developed bilateral lower extremity swelling. She says  it has been difficult to ambulate due to the numbness and pain at the bottom  of her feet. Denying any bowel or bladder incontinence and denying lower  extremity weakness.        On arrival to the ED, BP 100/65, pulse 92, resp 18, temp 37.2. Labs notable  for an elevated TSH 5.72 , and mildly elevated Sed rate 45. She was given 1L  NS in the ED and admitted 5 mg PO Oxycodone. Neurosurgery evaluated the  patient in the ED but did not feel like this was related to her recent surgery  however they recommended MRI L Spine to further evaluate.        Lumbar radiograph: There are postoperative changes of L4-L5 lumbar interbody  fusion with intervertebral disc spacer. No evidence of perihardware lucencies  are seen. There is levocurvature of the lumbosacral spine. No evidence for an  acute compression deformity is seen. There is grade 2 anterolisthesis of L4 on  L5 and  grade 1 anterolisthesis of L5 on S1. There is diffuse intervertebral  body height loss throughout the thoracolumbar spine. Severe multilevel  degenerative changes about the lumbosacral spine, most conspicuous at the  L5-S1 intervertebral level with sclerotic endplate changes and facet  arthropathy.        Admitted to medicine for further mgmt. .    **Chief Complaint / HPI**    Chief Complaint Numbness.    HPI [name] is a 60 year old female with history of scleroderma, IgG4  deficiency, diabetes, HTN, HLD, GIB, Lumbar stenosis s/p L4-5 TLIF on 03/24/20  who is presenting to the ED with numbness and pain in the bottom of her feet.        The patient underwent a L4-L5 TLIF on December 22 for lumbar stenosis with  spondylolisthesis. Following her surgery she was discharged to rehab. She said  she had been doing well at rehab and her back pain had improved. However,  toward the end of her stay at rehab she began developing some numbness and a  dull aching pain at the bottom of her feet. Then a few days following rehab  while at home, she begn developing right lower back pain similar from prior to  her surgery. She also developed bilateral lower extremity swelling. She says  it has  been difficult to ambulate due to the numbness and pain at the bottom  of her feet. Denying any bowel or bladder incontinence and denying lower  extremity weakness.        On arrival to the ED, BP 100/65, pulse 92, resp 18, temp 37.2. Labs notable  for an elevated TSH 5.72 , and mildly elevated Sed rate 45. She was given 1L  NS in the ED and admitted 5 mg PO Oxycodone. Neurosurgery evaluated the  patient in the ED but did not feel like this was related to her recent surgery  however they recommended MRI L Spine to further evaluate.        Lumbar radiograph: There are postoperative changes of L4-L5 lumbar interbody  fusion with intervertebral disc spacer. No evidence of perihardware lucencies  are seen. There is levocurvature of the  lumbosacral spine. No evidence for an  acute compression deformity is seen. There is grade 2 anterolisthesis of L4 on  L5 and grade 1 anterolisthesis of L5 on S1. There is diffuse intervertebral  body height loss throughout the thoracolumbar spine. Severe multilevel  degenerative changes about the lumbosacral spine, most conspicuous at the  L5-S1 intervertebral level with sclerotic endplate changes and facet  arthropathy.        Admitted to medicine for further mgmt. .    **Allergies**              -  NKDA        **Allergies**              -  NKDA        **Exam**    Comment Gen: appears well, NAD    Cardiac: normal s1,s2, no m/r/g    Resp: cta b/l    Neuro: ao x3, no focal neurological deficits, sensation intact to light touch  and pinprick, 5/5 strength throughout upper and lower extremities    Ext: trace edema bilaterally    ski: no rashes or lesions appreciated.    **Exam**    Comment Gen: appears well, NAD    Cardiac: normal s1,s2, no m/r/g    Resp: cta b/l    Neuro: ao x3, no focal neurological deficits, sensation intact to light touch  and pinprick, 5/5 strength throughout upper and lower extremities    Ext: trace edema bilaterally    ski: no rashes or lesions appreciated.    **Assessment and Plan**    Comment [name] is a 59 year old female with history of scleroderma,  IgG4 deficiency, diabetes, HTN, HLD, GIB, Lumbar stenosis s/p L4-5 TLIF on  03/24/20 who is presenting to the ED with numbness and pain in the bottom of  her feet.        #Foot pain / numbess    #Lower back pain    Unclear etiology but developed about one month post-operatively from L4-L5  TLIF. The patient has had difficulty walking recently because of her bilateral  foot pain/numbess. Her TSH is mildly elevated but T3/T4 wnl representing  subclinical hypothyroidism and unlikely to be related to her current symptoms.  Vitamin B12 and folate wnl. No clear cause of her symptoms at this time.    - f/u MRI lumbar spine    - Nusu  following, appreciate recs    - continue home oxycodone    - continue home gabapentin and pre-gabalin    - will likely need rehab placement        #Subclinical Hypothyroidism  Pt has hx of hypothyroidism and was on Synthroid at one point but was since  discontinued. TSH was mildly elevated at 5.72 but free t3 and total t4 wnl.    - no treatment indicated at this time        #Rheumatoid Arthritis    - continue home mycophenolate    - continue home hydroxychloroquine        #T2D    - hold home metformin    - continue home Jardiance    - ISS Lispro        FULL CODE    PIV    DVT: sub q heparin    Diet: diabetic        Dorothyann Peng, MD - PGY2    [phone] .    **Assessment and Plan**    Comment [name] is a 60 year old female with history of scleroderma,  IgG4 deficiency, diabetes, HTN, HLD, GIB, Lumbar stenosis s/p L4-5 TLIF on  03/24/20 who is presenting to the ED with numbness and pain in the bottom of  her feet.        #Foot pain / numbess    #Lower back pain    Unclear etiology but developed about one month post-operatively from L4-L5  TLIF. The patient has had difficulty walking recently because of her bilateral  foot pain/numbess. Her TSH is mildly elevated but T3/T4 wnl representing  subclinical hypothyroidism and unlikely to be related to her current symptoms.  Vitamin B12 and folate wnl. No clear cause of her symptoms at this time.    - f/u MRI lumbar spine    - Nusu following, appreciate recs    - continue home oxycodone    - continue home gabapentin and pre-gabalin    - will likely need rehab placement        #Subclinical Hypothyroidism    Pt has hx of hypothyroidism and was on Synthroid at one point but was since  discontinued. TSH was mildly elevated at 5.72 but free t3 and total t4 wnl.    - no treatment indicated at this time        #Rheumatoid Arthritis    - continue home mycophenolate    - continue home hydroxychloroquine        #T2D    - hold home metformin    - continue home Jardiance    -  ISS Lispro        FULL CODE    PIV    DVT: sub q heparin    Diet: diabetic        Dorothyann Peng, MD - PGY2    [phone] .            **Electronically signed by Claris Pong, MD on 04/21/2020 07:32**        **Electronically signed by Claris Pong, MD on 04/21/2020 07:32**

## 2020-04-20 NOTE — Progress Notes (Signed)
 **Subjective**    Subjective Completed MRI overnight.        Pain somewhat improved this AM.    **Exam**    Comment 97/68, HR 75, RR 18, O2 97% on RA.    Awake, alert, attentive. Normal affect and cognition. Speech clear and fluent.    No respiratory distress or increased work of breathing.    5/5 strength in the bilateral lower extremities in the IP, AT, gastrocnemius.    Pain in feet somewhat improved today, as patient allowed strength testing,  which she did not the day prior.    Incision well healed.      Vital Signs    04/22/2020 05:19              -  Temperature: 36.4 (35-37.8Cel)          -  Site: Oral          -  Heart Rate: 75 (60-90)          -  Site: Monitor          -  BP: 97/68 (90-140/60-90)          -  Site: Right Arm          -  Position: Lying          -  Method: Automated          -  Mean Arterial Pressure1: 70 (60)          -  Respirations: 18 (12-20)          -  O2 Saturation (%): 97          -  O2 Delivery Method: Room Air          -  MEWS Vital Sign Score: 0      04/22/2020 00:02              -  Morse Fall Risk Total: 45      04/21/2020 03:09              -  How Obtained: Stand. Scale          -  Weight: 36.9kg        **Assessment and Plan**    Medication              -   **TRAZODONE (DESYREL)** 100 MG Oral QHS for 60 Days           -   **GABAPENTIN (NEURONTIN)** 300 MG Oral QHS for 60 Days, Clinician Dir:FOR A TOTAL OF 600 MG AT BEDTIME           -   **PREDNISONE (DELTASONE)** 10 MG Oral QDAY First Dose Now for 60 Days           -   **GABAPENTIN (NEURONTIN)** 300 MG Oral TID for 60 Days           -   **PANTOPRAZOLE DR (PROTONIX)** 40 MG Oral BID for 60 Days, Clinician Dir:IC: OMEPRAZOLE 40 MG BID           -   **HYDROXYCHLOROQUINE (PLAQUENIL)** 200 MG Oral QDAY for 60 Days           -   **LISINOPRIL (PRINIVIL)** 2.5 MG Oral QDAY for 60 Days           -   **PREGABALIN (LYRICA)** 25 MG Oral BID for 60 Days           -   **MYCOPHENOLATE MOFETIL  (  CELLCEPT)** 1000 MG Oral BID for 60 Days           -   **EMPAGLIFLOZIN (JARDIANCE)** 10 MG Oral QDAY for 60 Days           -   ** ** ** **INSULIN LISPRO (HUMALOG) (HUMALOG)******** Sliding Scale Subcutaneous TIDWM for 60 Days      For Glucose 80 to 150 Give 0 Units    For Glucose 151 to 200 Give 2 Units    For Glucose 201 to 250 Give 3 Units    For Glucose 251 to 300 Give 4 Units    Notify Physician if: Call HO for glucose > 300          -   **BACLOFEN (LIORESAL)** 10 MG Oral BIDPRN muscle spasm for 60 Days           -   **FAMOTIDINE (PEPCID)** 20 MG Oral QDAYPRN Indigestion for 60 Days           -   **OXYCODONE (PERCOLONE)** 10 MG Oral Q6HPRN Pain score 7-10 for 7 Days           -   **HEPARIN** 5000 UNITS Subcutaneous Q8H for 60 Days           Neurosurgery Assessment and Plan Comment Data Reviewed:    ALK: 229, LFTs otherwise nl    LDH: 295    TSH: 5.72    FT4: 0.72    T3: 79    Folate: 9.3    B12: 1115    Sed rate: 45    CRP: 3.3        [name] is a 19F with PMH of scleroderma who presents with worsened pain  after L4-5 TLIF. She is intact on strength and sensory exam. Based on her  films, this is unlikely an issue with surgery, but MRI can be obtained to  definitively rule out postoperative problems. No leukocytosis this AM. She did  mention having difficulty getting out of bed because of pain and she does have  some BLE pain below the knees, so DVT should also be ruled out. This could be  related to scleroderma or peripheral neuropathy. Continue to recommend further  medical workup.        Recommendations:    - MRI L-spine w/wo contrast completed and reviewed, surgical level remains  decompressed, no nerve compression noted. [name] follow up final read.        Prelim MRI read:    1\. Interval postsurgical changes from a posterior fixation of L4-L5 with  placement of a disc spacer.    2\. Moderate to severe degenerative changes at L2-L3 and L3-L4 with moderate  and severe central canal stenosis,  respectively.    3\. S shaped scoliotic curvature of the lumbar spine the results in multiple  levels of significant foraminal stenosis.    4\. The remainder of the examination is unremarkable. There is no area of  abnormal contrast enhancement. There is no disc herniation.        - No surgical intervention required    - Follow up with Dr. Dionisio David as previously scheduled (1/31 @ 1030)    - BLE doppler US completed 1/18, negative.    - Peripheral neuropathy work up completed by primary team    - Rheumatology consulted, agree with steroids. Monitor incision closely as  steroids can impact wound healing            Mittie Bodo, PAC  Neurosurgery, [id number].            **Electronically signed by Zackery Barefoot Eathel Pajak, PA on 04/22/2020  10:35**

## 2020-04-20 NOTE — ED Provider Notes (Signed)
 Your patient [name] was seen in the emergency room on 04/20/2020      Created By  [employer] on 04/20/2020 at 02:43 PM    Electronically Signed By [name] -PC 4B- on 04/20/2020 at 02:48 PM

## 2020-04-21 LAB — HX CHEM-PANELS
HX 2021 EGFRCR: 103 mL/min/{1.73_m2} (ref 60–999)
HX ANION GAP: 6 (ref 3–14)
HX BLOOD UREA NITROGEN: 12 mg/dL (ref 6–24)
HX CHLORIDE (CL): 105 meq/L (ref 98–110)
HX CO2: 25 meq/L (ref 20–30)
HX CREATININE (CR): 0.63 mg/dL (ref 0.57–1.30)
HX GFR, AFRICAN AMERICAN: 115 mL/min/{1.73_m2}
HX GFR, NON-AFRICAN AMERICAN: 99 mL/min/{1.73_m2}
HX GLUCOSE: 118 mg/dL (ref 70–139)
HX POTASSIUM (K): 3.9 meq/L (ref 3.6–5.1)
HX SODIUM (NA): 136 meq/L (ref 135–145)

## 2020-04-21 LAB — HX HEM-ROUTINE
HX BASO #: 0 10*3/uL (ref 0.0–0.2)
HX BASO: 1 %
HX EOSIN #: 0.2 10*3/uL (ref 0.0–0.5)
HX EOSIN: 6 %
HX HCT: 37 % (ref 32.0–45.0)
HX HGB: 11.4 g/dL (ref 11.0–15.0)
HX IMMATURE GRANULOCYTE#: 0 10*3/uL (ref 0.0–0.1)
HX IMMATURE GRANULOCYTE: 0 %
HX LYMPH #: 0.9 10*3/uL — ABNORMAL LOW (ref 1.0–4.0)
HX LYMPH: 22 %
HX MCH: 29.8 pg (ref 26.0–34.0)
HX MCHC: 30.8 g/dL — ABNORMAL LOW (ref 32.0–36.0)
HX MCV: 96.6 fL (ref 80.0–98.0)
HX MONO #: 0.4 10*3/uL (ref 0.2–0.8)
HX MONO: 10 %
HX MPV: 10.7 fL (ref 9.1–11.7)
HX NEUT #: 2.6 10*3/uL (ref 1.5–7.5)
HX NRBC #: 0 10*3/uL
HX NUCLEATED RBC: 0 %
HX PLT: 171 10*3/uL (ref 150–400)
HX RBC BLOOD COUNT: 3.83 M/uL (ref 3.70–5.00)
HX RDW: 13.9 % (ref 11.5–14.5)
HX SEG NEUT: 61 %
HX WBC: 4.2 10*3/uL (ref 4.0–11.0)

## 2020-04-21 LAB — HX COAGULATION
HX INR PT: 1 (ref 0.9–1.3)
HX PROTHROMBIN TIME: 11.9 s (ref 9.7–14.0)
HX PTT: 30.5 s (ref 25.7–35.7)

## 2020-04-21 LAB — HX CHEM-LFT
HX ALANINE AMINOTRANSFERASE (ALT/SGPT): 38 IU/L (ref 0–54)
HX ALKALINE PHOSPHATASE (ALK): 229 IU/L — ABNORMAL HIGH (ref 40–130)
HX ASPARTATE AMINOTRANFERASE (AST/SGOT): 27 IU/L (ref 10–42)
HX BILIRUBIN, DIRECT: 0.2 mg/dL (ref 0.0–0.5)
HX BILIRUBIN, TOTAL: 0.5 mg/dL (ref 0.2–1.1)
HX LACTATE DEHYDROGENASE (LDH): 295 IU/L — ABNORMAL HIGH (ref 120–220)

## 2020-04-21 LAB — HX CHEM-OTHER
HX CALCIUM (CA): 8.9 mg/dL (ref 8.5–10.5)
HX MAGNESIUM: 1.8 mg/dL (ref 1.6–2.6)
HX PHOSPHORUS: 3.9 mg/dL (ref 2.7–4.5)

## 2020-04-21 LAB — HX MICRO-RESP VIRAL PANEL: HX COVID-19 (SARS-COV-2) RAPID: NEGATIVE

## 2020-04-21 LAB — HX MICRO

## 2020-04-21 LAB — HX POINT OF CARE
HX GLUCOSE-POCT: 145 mg/dL — ABNORMAL HIGH (ref 70–139)
HX GLUCOSE-POCT: 110 mg/dL (ref 70–139)
HX GLUCOSE-POCT: 107 mg/dL (ref 70–139)
HX GLUCOSE-POCT: 117 mg/dL (ref 70–139)

## 2020-04-21 LAB — HX DIABETES: HX GLUCOSE: 118 mg/dL (ref 70–139)

## 2020-04-22 ENCOUNTER — Ambulatory Visit

## 2020-04-22 ENCOUNTER — Ambulatory Visit: Admitting: Internal Medicine

## 2020-04-22 LAB — HX DIABETES
HX GLUCOSE: 106 mg/dL (ref 70–139)
HX HEMOGLOBIN A1C: 6.1 % — ABNORMAL HIGH

## 2020-04-22 LAB — HX POINT OF CARE
HX GLUCOSE-POCT: 113 mg/dL (ref 70–139)
HX GLUCOSE-POCT: 149 mg/dL — ABNORMAL HIGH (ref 70–139)

## 2020-04-22 LAB — HX CHEM-PANELS
HX 2021 EGFRCR: 103 mL/min/{1.73_m2} (ref 60–999)
HX ANION GAP: 9 (ref 3–14)
HX BLOOD UREA NITROGEN: 13 mg/dL (ref 6–24)
HX CHLORIDE (CL): 103 meq/L (ref 98–110)
HX CO2: 23 meq/L (ref 20–30)
HX CREATININE (CR): 0.63 mg/dL (ref 0.57–1.30)
HX GFR, AFRICAN AMERICAN: 115 mL/min/{1.73_m2}
HX GFR, NON-AFRICAN AMERICAN: 99 mL/min/{1.73_m2}
HX GLUCOSE: 106 mg/dL (ref 70–139)
HX POTASSIUM (K): 4 meq/L (ref 3.6–5.1)
HX SODIUM (NA): 135 meq/L (ref 135–145)

## 2020-04-22 LAB — HX CHEM-LFT
HX ALANINE AMINOTRANSFERASE (ALT/SGPT): 30 IU/L (ref 0–54)
HX ALKALINE PHOSPHATASE (ALK): 241 IU/L — ABNORMAL HIGH (ref 40–130)
HX ASPARTATE AMINOTRANFERASE (AST/SGOT): 19 IU/L (ref 10–42)
HX BILIRUBIN, DIRECT: 0.2 mg/dL (ref 0.0–0.5)
HX BILIRUBIN, TOTAL: 0.5 mg/dL (ref 0.2–1.1)
HX LACTATE DEHYDROGENASE (LDH): 134 IU/L (ref 120–220)

## 2020-04-22 LAB — HX CHEM-OTHER
HX ALBUMIN: 3.7 g/dL (ref 3.4–4.8)
HX CALCIUM (CA): 8.9 mg/dL (ref 8.5–10.5)
HX MAGNESIUM: 1.9 mg/dL (ref 1.6–2.6)
HX PHOSPHORUS: 4.7 mg/dL — ABNORMAL HIGH (ref 2.7–4.5)
HX PROTEIN, TOTAL: 6.6 g/dL (ref 6.0–8.3)

## 2020-04-22 LAB — HX CHEM-METABOLIC: HX HEMOGLOBIN A1C: 6.1 % — ABNORMAL HIGH

## 2020-04-22 LAB — HX OTHER TESTS: HX HEPATITIS C ANTIBODY: NONREACTIVE

## 2020-04-22 LAB — HX HEM-MISC: HX SED RATE: 52 mm — ABNORMAL HIGH (ref 0–30)

## 2020-04-22 MED FILL — *GABAPENTIN 100 MG CAP: 7 days supply | Qty: 42 | Fill #0 | Status: AC

## 2020-04-22 MED FILL — *predniSONE 10 MG TABS: 3 days supply | Qty: 3 | Fill #0 | Status: AC

## 2020-04-26 ENCOUNTER — Ambulatory Visit

## 2020-04-26 ENCOUNTER — Ambulatory Visit: Admitting: Hepatology

## 2020-04-26 ENCOUNTER — Ambulatory Visit: Admitting: Rheumatology

## 2020-04-26 ENCOUNTER — Ambulatory Visit: Admitting: Internal Medicine

## 2020-04-26 ENCOUNTER — Ambulatory Visit: Admit: 2020-04-26 | Payer: 59

## 2020-04-26 LAB — HX PROTEIN ELECT
HX ALBUMIN SPEP: 3.75 g/dL (ref 3.50–5.10)
HX ALPHA 1 GLOBULIN FRACTION: 0.25 g/dL (ref 0.10–0.30)
HX ALPHA 2 GLOBULIN FRACTION: 1 g/dL (ref 0.50–1.00)
HX BETA GLOBULIN FRACTION: 0.85 g/dL (ref 0.50–1.10)
HX GAMMA GLOBULIN FRACTION: 0.75 g/dL (ref 0.50–1.30)

## 2020-04-26 NOTE — Progress Notes (Signed)
 General Medicine Tele-Medicine Visit  Patient presents during the COVID-19 pandemic / federally declared state of   public health emergency.  This visit is done remotely with myself and this patient.  Visit Type: Audio Visit Only  Video Visit Vendor [employer]  Patient located in [place]: YES in [place]  * Preceptor: Sathi Microbiologist)  Patient consent for audio or video type visit:  Yes  History of Present Illness:   Pain is significantly improved - numbness 90% gone, leg swelling improved b  y 90%  AM has pain in back and legs, takes couple of hours to get going   Walking well without issue, walked with crutches outside; using walker insi  de house  Feels stronger  Feels Lyrica and Prednisone is working  .  .  .  .  .  .  .  .  .  .  .  .  Negative except as noted in HPI  .  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-Q21.1)  SCREENING EXAM FOR BREAST CANCER (ICD-V76.10) (ICD10-Z12.39)  ANEMIA, OTHER (ICD-285.8) (ICD10-D64.89)  LOWER EXTREMITY EDEMA (ICD-782.3) (ICD10-R60.0)  IRREGULAR HEART BEATS (ICD-427.9) (ICD10-I49.9)  GI BLEED (ICD-578.9) (GUR42-H06.2)  SCLERODERMA (ICD-710.1) (ICD10-M34.9)  SCREENING FOR CERVICAL CANCER (ICD-V76.2) (CBJ62-G31.4)  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) (ICD10-M47.812)  NECK PAIN (ICD-723.1) (ICD10-M54.2)  STEROID USE, LONG TERM (ICD-V58.65) (ICD10-Z79.52)  PREMATURE VENTRICULAR CONTRACTIONS (ICD-427.69) (ICD10-I49.3)  HYPERLIPIDEMIA (ICD-272.4)  (ICD10-E78.5)  BACK PAIN, LUMBAR, CHRONIC (ICD-724.2) (ICD10-M54.5)  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)  DIABETES MELLITUS, TYPE II (ICD-250.00) (ICD10-E11.9)  LONG-TERM (CURRENT) USE OF STEROIDS (ICD-V58.65) (ICD10-Z79.51)  SKIN SAGGING DUE TO WEIGHT LOSS (ICD-757.9) (ICD10-Q84.9)  TRANSAMINASES, SERUM, ELEVATED (ICD-790.4) (ICD10-R74.0)  OBESITY, BMI 30-34.9, ADULT (ICD-278.00) (ICD10-E66.9)      DIABETES MELLITUS, TYPE II, UNCONTROLLED (ICD-250.02) (ICD10-E11.65)      FATTY LIVER DISEASE (ICD-571.8) (ICD10-K76.0)  SCLERODERMA, 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)  HYPOTHYROIDISM (ICD-244.9) (ICD10-E03.9)  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) (ICD10-D  80.8)  SPINAL STENOSIS, 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)  HEPATITIS C EXPOSURE (HCV RNA NEGATIVE, 01/2014) (ICD-V02.62) (ICD10-Z20.5)  PAIN IN JOINT, HAND (ICD-719.44) (ICD10-M79.643)  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  Past Medical History (changes today):  Added new problem of PERIPHERAL NEUROPATHY (ICD-356.9) (ICD10-G62.9)  .  [name]  Medications (prior to today's visit):  HYDROXYCHLOROQUINE SULFATE 200 MG ORAL TABLET (HYDROXYCHLOROQUINE SULFATE)   one tablet oral daily; Route: ORAL  JARDIANCE 10 MG ORAL TABLET (EMPAGLIFLOZIN) Take 1 tablet by mouth once dai  ly; Route: ORAL  ROSUVASTATIN 10MG  TABLETS (ROSUVASTATIN CALCIUM) TAKE 1 TABLET BY MOUTH EVE  RY DAY  LISINOPRIL 2.5MG  TABLETS (LISINOPRIL) TAKE 1 TABLET BY MOUTH ONCE DAILY  METFORMIN  HCL ER 500 MG XR24H-TAB (METFORMIN HCL) TAKE 4  TABLETS BY MOUTH E  VERY MORNING  OMEPRAZOLE 20 MG ORAL CAPSULE DELAYED RELEASE (OMEPRAZOLE) Take one capsule   by mouth twice a day; Route: ORAL  DICLOFENAC SODIUM 1 % TRANSDERMAL GEL (DICLOFENAC SODIUM) APP 2 GRAMS EXT A  A BID      FREESTYLE FREEDOM LITE w/Device KIT (BLOOD GLUCOSE MONITORING SUPPL) [place]  e as directed (ICD 10 E11.65)      FREESTYLE LITE BLOOD GLUCOSE STRIPS (GLUCOSE BLOOD) USE TO CHECK BLOOD   GLUCOSE THREE TIMES DAILY      FREESTYLE LANCETS (LANCETS) check fingerstick three times a day (ICD 10   E11.65)  OXYCODONE HCL 5 MG ORAL TABLET (OXYCODONE HCL) Take one tablet daily as nee  ded for pain >8. Partial fill upon patient request.; Route: ORAL  CALCIUM CARBONATE-VITAMIN D 600-200 MG-UNIT ORAL TABLET (CALCIUM CARBONATE-  VITAMIN D) take one tablet twice a day; Route: ORAL  * BLOODWORK Please draw chemistry (sodium, potassium, magnesium, chloride,   bicarbonate, glucose, creatinine, BUN), CBC, TSH, lipids, HbA1c. Fax result   to [phone] Dr. Marlane Hatcher  LIDOCAINE 5 % PTCH (LIDOCAINE) APPLY ONE PATCH TO BACK ONCE DAILY AS NEEDED   FOR PAIN, REMOVE AFTER 12 HRS  MYCOPHENOLATE MOFETIL 500 MG ORAL TABLET (MYCOPHENOLATE MOFETIL) Take 2 tab  lets twice daily; Route: ORAL  GAMMAGARD 20 GM/200ML INJECTION SOLUTION (IMMUNE GLOBULIN (HUMAN)) Receives   infusion every 4 weeks, next dose 8/5; Route: INJECTION  URSODIOL 500 MG ORAL TABLET (URSODIOL) Take 1 tablet twice daily; Route: OR  AL  * WHEEL CHAIR WITH FOOT PEDALS USE AS INSTRUCTED HT 62.5 WT 158.6 M54.5  M4  8.06  * SHOWER CHAIR USE AS INSTRUCTED  HT 62.5 WT 158.9 E11.9 -- M54.5 -- M89.9  * RAISED TOILET SEAT   * LOFT STAND CRUTCHES USE AS INSTRUCTED  HT 62.5 WT 158.6 M54.4 -- R60.0 --   M89.9  BACTRIM DS 800-160 MG ORAL TABLET (SULFAMETHOXAZOLE-TRIMETHOPRIM) Take one   tablet by mouth twice daily for 7 days; Route: ORAL  famotidine 20 mg tablet (famotidine) TAKE 1 TABLET BY MOUTH AT NIGHT AS  NEE  DED  trazodone 100 mg tablet (trazodone) Take 1 tablet by mouth every night   baclofen 10 mg tablet (baclofen) Take 1 tablet by mouth twice a day as need  ed for back spasms  gabapentin 300 mg capsule (gabapentin) Take 2 capsule by mouth three times   a day as needed for pain. This is a dose increase s/p back surgery done on   03/24/20. Okay to refill at this time. 30 day supply with plan to taper.  .  Medications (after today's visit):  * BLOODWORK Please draw chemistry (sodium, potassium, magnesium, chloride,   bicarbonate, glucose, creatinine, BUN), CBC, TSH, lipids, HbA1c. Fax result   to [phone] Dr. Marlane Hatcher  * RAISED TOILET SEAT   famotidine 20 mg tablet (famotidine) TAKE 1 TABLET BY MOUTH AT NIGHT AS NEE  DED  trazodone 100 mg tablet (trazodone) Take 1 tablet by mouth every night   baclofen 10 mg tablet (baclofen) Take 1 tablet by mouth twice a day as need  ed for back spasms  Jardiance 10 mg tablet (empagliflozin) Take 1 tablet by mouth once a day   oxycodone 5 mg tablet (oxycodone) Take 1 tablet once a day as needed   mycophenolate mofetil 500 mg tablet (mycophenolate mofetil) Take 2 tablet t  wice a day   hydroxychloroquine 200 mg tablet (hydroxychloroquine) 1 tablet by mouth onc  e a  day   * WHEEL CHAIR WITH FOOT PEDALS Use as directed   metformin 500 mg tablet extended release 24 hr (metformin) Take 4 tablet by   mouth every morning   lisinopril 2.5 mg tablet (lisinopril) Take 1 tablet by mouth once a day   * FREESTYLE LITE TEST STRP (GLUCOSE BLOOD) Use three times a day   * LOFT STAND CRUTCHES Use as directed   rosuvastatin 10 mg tablet (rosuvastatin) Take 1 tablet by mouth once a day   FreeStyle Lancets 28 gauge 28 gauge (lancets) Use three times a day   Bactrim DS 800-160 mg tablet (sulfamethoxazole-trimethoprim) Take 1 tablet   by mouth twice a day   FreeStyle Freedom Lite kit (blood-glucose meter) Use as directed   ursodiol 500 mg tablet (ursodiol) Take 1 tablet twice a day   * SHOWER CHAIR  Use   Gammagard Liquid 10% solution (immun glob g(igg)-gly-iga ov50) every four w  eeks   * CALCIUM CARBONATE-VITAMIN D 600-200 MG-UNIT TABS (CALCIUM CARBONATE-VITAM  IN D) Take 1 tablet twice a day   omeprazole 20 mg capsule,delayed release(DR/EC) (omeprazole) Take 1 capsule   by mouth twice a day   * DICLOFENAC SODIUM 1 % TRANSDERMAL GEL Apply 2 gram twice a day   lidocaine 5% adhesive patch,medicated (lidocaine) Apply 1 patch to skin onc  e a day as needed   pregabalin 25 mg capsule (pregabalin) Take 1 capsule by mouth as directed t  ake 1 capsule by mouth once daily in AM and 2 capsules in PM  gabapentin 300 mg capsule (gabapentin) Take 1 capsule by mouth twice a day   as needed every morning and afternoon. For pain, on taper.  .  .  .  .  .  .  .  Vitals from Patient:   Ht: 62.5 in.    .  .  .  .  Additional PE:   General: sounds well, no acute distress noted in voice  Neuro: ambulates with assistance devices  Assessment /T/ Plan:   #)Bilateral lower extremtiy neuropathic pain  Recent hospitalization for worsening lower extremity edema and burning prev  enting her from walking.  She was evaluated by neurosurgery, palliative car  e, and rheum.  Her regimen was adjsuted to Lyrica 25 mg BID, Gabapentin 300   mg TID, and a 5-day course of prednisone 10 mg was started.  She overall i  s improving, so will continue the plan to transition to Lyrica.  - increase to Lyrica 50 mg in the evening, continue 25 mg qam  - Stop evening Gabapentin dose, continue 300 mg qam and qnoon  - Discuss with [name] (rheum) regarding continuing prednisone  - follow-up next Monday   .  .  I spent 20 minutes with the patient counseling and treating the above condi  tions.  Location of Provider:  Home  Names of Participants for Visit: Garner Nash, [name], Gaynelle Arabian MD  .  Orders (this visit):  Telephone Call Intermediate (11-20 min) [[id number]]  .  **Preceptor Note**  Resident: Tomma Lightning)  Problem  Follow-up  .  [name]  Subjective   Patient is a 60 Years Old Female who presents to clinic for follow-up.  .  Admitted with LE edema and neuropathic pain post surgery.  Surgical site ap  peared well.  Pt seen by palliative care while inpatient and started on Lyr  ica 25 mg BID.  Pt notes symptoms have mostly resolved.  Pt was also given   prednisone, pt is unsure on when to continue.  Has upcoming follow-up with   rheumatology and will reach out directly.   I discussed the patient with Dr. Nicholes Stairs Huntley Dec) in a routine precepting en  counter.  I also personally interviewed and examined the patient.  I agree   with the patient's diagnosis and management.   .  .  Objective   General:  Alert, NAD  ENT:  Normal hearing  Pulm:  Normal respiratory effort; speaking in full sentences  Psych: normal affect; intact memory  .  .  see resident's note for additional details, see resident's note for details  .    [name]  Assessment   neuropathy  LE edema  .  Plan   - will increase bedtime Lyrica to 50 mg daily and drop bedtime Gabapentin (  keep 300 mg BID)  - will further titrate dosing at next visit  See resident note for details, Follow-up per resident note  .  .  .  .  .  .  Patient Care Plan  .  [name]  Immunization Worksheet 2019 (rev 07/09/2019)   .  .  .  .  .  .  .  .  .  .  .  .  .  Follow-up With:   .  .  [name]  Electronically Signed by Clearence Cheek MD on 04/26/2020 at 1:57 PM  ________________________________________________________________________

## 2020-04-26 NOTE — Progress Notes (Signed)
* * *      [name], [name] **DOB:** [birth year] (60 yo F) **Acc No.** [id number] **DOS:**  04/26/2020    ---        [name], [name]**    ------    12 Y old Female, DOB: 1960-08-27    [address], ([address] [place], [place] [address]    Home: [phone]    Provider: [name]        * * *    Telephone Encounter    ---    Answered by    Tomasita Crumble    Date: 04/26/2020        Time: 03:10 PM    Caller    Patient    ------            Reason    appointment change            Message                      Patient left a voicemail to see if she can change her 05/07/2020 appointment to 05/10/2020                Action Taken                      [name],[name]  04/26/2020 3:21:03 PM > explained to patient we can not chnage                    * * *                ---          * * *          Provider: [name] 04/26/2020    ---    Note generated by e[employer] EMR/PM Software ([email or web address])

## 2020-04-29 LAB — HX IMMUNOLOGY
HX C-REACTIVE PROTEIN - CRP: 2 mg/L (ref 0.00–7.48)
HX CRYOGLOBULIN: NEGATIVE mg/mL (ref 0.00–0.10)
HX HEPATITIS B SURFACE ANTIBODY: 61.6 m[IU]/mL — ABNORMAL HIGH (ref 0.0–7.9)
HX HEPATITIS B SURFACE ANTIGEN: NONREACTIVE
HX HEPATITIS C ANTIBODY: NONREACTIVE

## 2020-05-03 ENCOUNTER — Ambulatory Visit

## 2020-05-03 ENCOUNTER — Ambulatory Visit: Admitting: Neurological Surgery

## 2020-05-03 ENCOUNTER — Ambulatory Visit: Admitting: Internal Medicine

## 2020-05-03 ENCOUNTER — Ambulatory Visit: Admit: 2020-05-03

## 2020-05-03 NOTE — Progress Notes (Signed)
 General Medicine Visit - Resident note with preceptor addendum  .  .  .  * [place]: Chan(Angela)  .  Patient Details and Vitals  Patient reviewed in/by:  Office  Patient Best Phone # [phone]  Patient Email Address [name]  .  BMI: 27.67  .  BP: 105/ 74  Ht (inches): 62.5  Weight: 153.2  BMI: 27.67  Temp: 99.4  Pulse: 104  .  Med List: PRINTED by [facility] for patient   hd_medl: printed  .  [name]  Patient Medical History   Travel outside of the [place] in past 28 days:: No  .  In the past year, have you ... Had no falls  Difficulty with balance? YES  Use a Cane NO  Use a Walker NO  Need assistance with ambulation while here? NO  .  [name]  COVID Patient Symptom Screening   Note any symptoms in the prior 10 days:   In past 14 days, exposure to anyone with above symptoms or positive test?   COVID-19 Exposure: No  In past 14 days, told by public health authoirty or healthcare professional   to quarantine?   COVID-19 Quarantine: No  COVID Vaccine; remind availability for walk-in vaccines. If NO, patient mus  t wear mask..   Been FULLY vaccinated for COVID-19: Yes  COVID Vaccine [place]: ask patient if received the Booster shot?   Booster shot for COVID-19: Yes  Tobacco use? never smoker  Does patient experience chronic pain ? YES  Severity of pain? (min=0, max=10) 5  .  Patient Screening   .  .  .  .  .  .  .  .  .  .  Data to be shared to Telemed/Office Visits   May 03, 2020 2:38 PM  Screening [place]: Johny Drilling [name]  Patient is a falls risk  Chronic Pain, level = 5  .  ---------- ---------- ----------   .  .....................................[name] KitchenMarland KitchenAdline Peals  May 03, 2020 2:38 PM  .  .  **Resident Note**  .  .  Patient Prep Updates   [place] Pre-Visit Notes:  May 03, 2020 2:38 PM  Screening [place]: Johny Drilling [name]  Patient is a falls risk  Chronic Pain, level = 5  .  ---------- ---------- ----------   .  .  .  .  .  .  .  .  * Preceptor: Sathi (Kinji)  CC: Chronic pain  .  HPI: Overall pain is doing better; across back and down  leg  If not bad, then takes Lyrica and oxycodone.  Once moving feels better.  Uses oxycodone in the morning or in the afternoon and takes one to two at n  ight  Does not note that Lyrica is making her sleepy at night, but her dose in th  e morning has been well tolerated.  Continues to work with PT to increase mobility.  .  Has noted some tender subcutaneous nodules over the areas where [facility] was inj  ected and over the right side of her ribs.  Tender to touch. No fevers/chil  ls.  .  .  .  .  .  .  .  .  Review of Systems: Negative except as noted in HPI  .  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) (ICD10-G62.9)  BACK PAIN, CHRONIC (ICD-724.5) (ICD10-M54.89)  NODULAR RASH (ICD-782.1) (ICD10-R21)  ANNUAL EXAM (18+) (ICD-V70.0) (ICD10-Z00.00)  RUPTURE OF MUSCLE (ICD-848.9) (ICD10-M62.18)  GASTROPARESIS (  ICD-536.3) (FAO13-Y86.57)  BONE LESION - PELVIS (ICD-733.90) (ICD10-M89.9)  GANGLION (ICD-727.43) (ICD10-M67.40)  INTERNAL HEMORRHOID (ICD-455.0) (QIO96-E95.2)  DIVERTICULOSIS, COLON (ICD-562.10) (ICD10-K57.30)  EROSIVE GASTRITIS (ICD-535.40) (ICD10-K29.60)  MUSCLE RUPTURE, NONTRAUMATIC (ICD-728.83) (ICD10-M62.10)  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-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.1)  SCREENING EXAM FOR BREAST CANCER (ICD-V76.10) (ICD10-Z12.39)  ANEMIA, OTHER (ICD-285.8) (ICD10-D64.89)  LOWER EXTREMITY EDEMA (ICD-782.3) (ICD10-R60.0)  IRREGULAR HEART BEATS (ICD-427.9) (ICD10-I49.9)  GI BLEED (ICD-578.9) (WUX32-G40.2)  SCLERODERMA (ICD-710.1) (ICD10-M34.9)  SCREENING FOR CERVICAL CANCER (ICD-V76.2) (NUU72-Z36.4)  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) (ICD10-M47.812)  NECK PAIN (ICD-723.1) (ICD10-M54.2)  STEROID USE, LONG TERM (ICD-V58.65) (ICD10-Z79.52)  PREMATURE VENTRICULAR CONTRACTIONS (ICD-427.69) (ICD10-I49.3)  HYPERLIPIDEMIA (ICD-272.4) (ICD10-E78.5)  BACK PAIN, LUMBAR, CHRONIC (ICD-724.2) (ICD10-M54.5)  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)  DIABETES MELLITUS, TYPE II  (ICD-250.00) (ICD10-E11.9)  LONG-TERM (CURRENT) USE OF STEROIDS (ICD-V58.65) (ICD10-Z79.51)  SKIN SAGGING DUE TO WEIGHT LOSS (ICD-757.9) (ICD10-Q84.9)  TRANSAMINASES, SERUM, ELEVATED (ICD-790.4) (ICD10-R74.0)  OBESITY, BMI 30-34.9, ADULT (ICD-278.00) (ICD10-E66.9)      DIABETES MELLITUS, TYPE II, UNCONTROLLED (ICD-250.02) (ICD10-E11.65)      FATTY LIVER DISEASE (ICD-571.8) (ICD10-K76.0)  SCLERODERMA, 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)  HYPOTHYROIDISM (ICD-244.9) (ICD10-E03.9)  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) (ICD10-D  80.8)  SPINAL STENOSIS, 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)  HEPATITIS C EXPOSURE (HCV RNA NEGATIVE, 01/2014) (ICD-V02.62) (ICD10-Z20.5)  PAIN IN JOINT, HAND (ICD-719.44) (ICD10-M79.643)  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)  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)  CARPAL TUNNEL (ICD-354.0) (ICD10-G56.00)  OSTEOARTHRITIS (ICD-715.09) (ICD10-M15.9)  S/P ROTATOR CUFF SURGERY 12/2004 - RT SHOULDER; 2008 LT (ICD-V45.89)  Family Hx of MELANOMA, FAMILY HX (ICD-V16.8) (ICD10-Z80.8)  FRACTURE OF OTHER SPEC SITE,  PATHOLOGIC - MULTIPLE (ICD-733.19)  CHOLECYSTECTOMY AND HERNIA REPAIR (ICD-V45.89)  VITAMIN D DEFICIENCY (ICD-268.9) (ICD10-E55.9)  COLONOSCOPY, NEXT 2020- SEE COMMENT (ICD-V76.51) (ICD10-Z01.89)  PROBLEM CHANGES:  Changed problem from BACK PAIN, LUMBAR, CHRONIC (ICD-724.2) (ICD10-M54.5) t  o LOW BACK PAIN, CHRONIC (ICD-724.2) (ICD10-M54.59)  Changed problem from TRANSAMINASES, SERUM, ELEVATED (ICD-790.4) (ICD10-R74.  0) to ELEVATION OF LEVELS OF LIVER TRANSAMINASE LEVELS (ICD10-R74.01)  Changed problem from SCLERODERMA, 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) to SCLERODERMA (ICD-7  10.1) (ICD10-M34.9)  Added new problem of SUBCUTANEOUS MASS OF CHEST WALL (ICD-786.6) (ICD10-R22  .2)  .  [name]  MEDICATIONS:   PAST MEDICINES (prior to today's visit):  * BLOODWORK Please draw chemistry (sodium, potassium, magnesium, chloride,   bicarbonate, glucose, creatinine, BUN), CBC, TSH, lipids,  HbA1c. Fax result   to [phone] Dr. Marlane Hatcher  * RAISED TOILET SEAT   famotidine 20 mg tablet (famotidine) TAKE 1 TABLET BY MOUTH AT NIGHT AS NEE  DED  trazodone 100 mg tablet (trazodone) Take 1 tablet by mouth every night   baclofen 10 mg tablet (baclofen) Take 1 tablet by mouth twice a day as need  ed for back spasms  Jardiance 10 mg tablet (empagliflozin) Take 1 tablet by mouth once a day   oxycodone 5 mg tablet (oxycodone) Take 1 tablet once a day as needed   mycophenolate mofetil 500 mg tablet (mycophenolate mofetil) Take 2 tablet t  wice a day   hydroxychloroquine 200 mg tablet (hydroxychloroquine) 1 tablet by mouth onc  e a day   * WHEEL CHAIR WITH FOOT PEDALS Use as directed   metformin 500 mg tablet extended release 24 hr (metformin) Take 4 tablet by   mouth every morning   lisinopril 2.5 mg tablet (lisinopril) Take 1 tablet by mouth once a day   * FREESTYLE LITE TEST STRP (GLUCOSE BLOOD) Use three times a day   * LOFT STAND CRUTCHES Use  as directed   rosuvastatin 10 mg tablet (rosuvastatin) Take 1 tablet by mouth once a day   FreeStyle Lancets 28 gauge 28 gauge (lancets) Use three times a day   Bactrim DS 800-160 mg tablet (sulfamethoxazole-trimethoprim) Take 1 tablet   by mouth twice a day   FreeStyle Freedom Lite kit (blood-glucose meter) Use as directed   ursodiol 500 mg tablet (ursodiol) Take 1 tablet twice a day   * SHOWER CHAIR Use   Gammagard Liquid 10% solution (immun glob g(igg)-gly-iga ov50) every four w  eeks   * CALCIUM CARBONATE-VITAMIN D 600-200 MG-UNIT TABS (CALCIUM CARBONATE-VITAM  IN D) Take 1 tablet twice a day   omeprazole 20 mg capsule,delayed release(DR/EC) (omeprazole) Take 1 capsule   by mouth twice a day   * DICLOFENAC SODIUM 1 % TRANSDERMAL GEL Apply 2 gram twice a day   lidocaine 5% adhesive patch,medicated (lidocaine) Apply 1 patch to skin onc  e a day as needed   pregabalin 25 mg capsule (pregabalin) Take 1 capsule by mouth as directed t  ake 1 capsule by mouth once daily in AM  and 2 capsules in PM  gabapentin 300 mg capsule (gabapentin) Take 1 capsule by mouth twice a day   as needed every morning and afternoon. For pain, on taper.  [name]  MEDICINE CHANGES:  Changed medication from oxycodone 5 mg tablet (oxycodone) Take 1 tablet onc  e a day as needed  to oxycodone 10 mg tablet (oxycodone) Take 1 tablet twic  e a day as needed Take 1-2 pills as needed - Signed  Changed medication from pregabalin 25 mg capsule (pregabalin) Take 1 capsul  e by mouth as directed take 1 capsule by mouth once daily in AM and 2 capsu  les in PM to pregabalin 25 mg capsule (pregabalin) Take 2 capsule by mouth   every twelve hours ; Route: BY MOUTH - Signed  Deleted medication of gabapentin 300 mg capsule (gabapentin) Take 1 capsule   by mouth twice a day as needed every morning and afternoon. For pain, on t  aper.; Route: BY MOUTH  Added new medication of oxycodone 10 mg tablet (oxycodone) Take 1 tablet by   mouth twice a day as needed Take 1-2 pills as needed for pain; Route: BY M  OUTH - Signed  Removed medication of oxycodone 10 mg tablet (oxycodone) Take 1 tablet twic  e a day as needed Take 1-2 pills as needed - Signed  Changed medication from baclofen 10 mg tablet (baclofen) Take 1 tablet by m  outh twice a day as needed for back spasms to baclofen 10 mg tablet (baclof  en) Take 1 tablet by mouth twice a day as needed for back spasms; Route: BY   MOUTH - Signed  Changed medication from pregabalin 25 mg capsule (pregabalin) Take 2 capsul  e by mouth every twelve hours  to pregabalin 25 mg capsule (pregabalin) Tak  e 2 capsule by mouth every twelve hours ; Route: BY MOUTH - Signed  Rx of pregabalin 25 mg capsule (pregabalin) Take 2 capsule by mouth every t  welve hours ;  #120 capsule x 1;  Signed;  Entered by: Clearence Cheek MD;    Authorized by: Clearence Cheek MD;  Method used: Electronically to [facility] [phone] [address] [place], [place] [address], Ph: [phone] Fax: [phone],   Rx of oxycodone  10 mg tablet (oxycodone) Take 1 tablet by mouth twice a day   as  needed Take 1-2 pills as needed for pain;  #45 tablet x 0;  Signed;  En  tered by: Clearence Cheek MD;  Authorized by: Clearence Cheek MD;  Method u  sed: [facility] to [facility] [phone] [address], [place], [place] [address], P  h: [phone] Fax: [phone],   Rx of baclofen 10 mg tablet (baclofen) Take 1 tablet by mouth twice a day a  s needed for back spasms;  #60 tablets x 5;  Signed;  Entered by: Clearence Cheek MD;  Authorized by: Clearence Cheek MD;  Method used: Electronically   to [facility] [phone] [address], [place], [place] [address], Ph: [phone] F  ax: [phone],   .  [name]  ALLERGIES:   .  [name]  DIRECTIVES:   .  .  .  Vitals:   BP: 105 / 74 mmHg Ht: 62.5 in.  Wt: 153.2 lbs.  BMI (in-lb) 27.67  Temp: 99.4deg F.   Pulse Rate: 104 bpm  .  .  Additional PE: General: pleasant, milddle-aged woman, appears older than 35  ated age  Skin: numerous subcutaneous nodules over right ribs, one tender to touch  Neuro: gait limited due to pain with jolting side to side movement; utilize  s crutches for long distances, able to get up and down from exam table unas  sisted  .  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  Will plan to again increase Lyrica and st  op Gabapentin with room to increase Lyrica again should needed.  Continue t  o utilize Oxycodone as needed and hopefully will be able to reduce use over   time.  If not, she will require opioid use contract.  - increase to Lyrica 50 mg BID, stop gabapentin  -- if needs increased pain control, can increase Lyrica to 75 mg first at n  ight, then BID  - con't utilize oxycodone 10 mg 1-2 tablets BID as needed; should she conti  nue to require this in 2 months, will need opioid contract  - con't Baclofen, Trazodone, and PT  - RTC in 2 months  .  #)Subcutaneous nodules of right ribs  Noted on exam.  Bedside POCUS with what appears to be fluid  filled pockets   like cysts with some density noted.  Largest one is adjacent to rib and pai  nful.  Will perform ribs films to better evaluate.  - rib x-ray of right  .  .  .  * Resident Signature (.sign): ......................................[name] KitchenIvette Loyal MD -PC 4B-  May 03, 2020 3:29 PM  .  .  .  ORDERS:  Est Level 4 (MDM or 30-39 min) [CPT-99214]  XR Ribs [CPT-71100]  RTC (Return to Clinic) [RTC-000]  .  [name]  **Preceptor Note**  Resident: Tomma Lightning)  Problem Follow-up  .  [name]  Subjective   Patient is a 60 Years Old Female who presents to clinic for follow-up.  [name]  Neuropathy - Lyrica increased on last visit to 2 capsules in the evening wi  th discontinuation of PM gabapentin dose.  .  Had spinal surgery (L4-L5 TLIF) with ongoing pain.  Still taking narcotics.    Doing PT.  [name]  Notes painful lesion on abdomen.   I discussed the patient with Dr. Nicholes Stairs Huntley Dec) in a routine precepting en  counter.  I also personally interviewed and examined the patient.  I agree   with  the patient's diagnosis and management.   .  .  Objective   General:  Alert, NAD  Head:  Normocephalic, atraumatic  Eyes:  No conjunctival pallor  ENT:  Normal hearing; moist mucous membranes  Pulm:  Normal respiratory effort  MS:  Antalgic gait; no LE edema  Psych:  normal affect; intact memory  POCUS:  cystic lesions, one near rib which is tender has central hyperechoi  c area  .  see resident's note for additional details, see resident's note for details  .    [name]  Assessment   neuropathic pain  spinal surgery s/p TLIF  abdominal lumps - on POCUS cystic appearing, one next to rib  .  Plan   - increase Lyrica to 50 mg BID, stop gabapentin  - continue oxycodone as needed  - will order rib films to further assess abdominal lump, likely cystic lesi  ons  See resident note for details, Follow-up per resident note  .  .  .  .  Patient Care Plan  .  .  .  Immunization Worksheet 2019 (rev 07/09/2019)   .  .  .  .  .  .  .  .  .  .  .  .  .  Follow-up  With:   .  .  [name]  Electronically Signed by Clearence Cheek MD on 05/03/2020 at 5:13 PM  ________________________________________________________________________

## 2020-05-03 NOTE — Progress Notes (Signed)
 General Medicine Visit - Resident note with preceptor addendum        * [place]: [name])    Patient Details and Vitals  Patient reviewed in/by:  Office  Patient Best Phone # [phone]  Patient Email Address [name]    BMI: 27.67    BP: 105/ 74  Ht (inches): 62.5  Weight: 153.2  BMI: 27.67  Temp: 99.4  Pulse: 104    Med List: PRINTED by Erin for patient   hd_medl: printed      Patient Medical History   Travel outside of the [place] in past 28 days:: No    In the past year, have you ... Had no falls  Difficulty with balance? YES  Use a Cane NO  Use a Walker NO  Need assistance with ambulation while here? NO      COVID Patient Symptom Screening   Note any symptoms in the prior 10 days:   In past 14 days, exposure to anyone with above symptoms or positive test?   COVID-19 Exposure: No  In past 14 days, told by public health authoirty or healthcare professional to quarantine?   COVID-19 Quarantine: No  COVID Vaccine; remind availability for walk-in vaccines. If NO, patient must wear mask..   Been FULLY vaccinated for COVID-19: Yes  COVID Vaccine [place]: ask patient if received the Booster shot?   Booster shot for COVID-19: Yes  Tobacco use? never smoker  Does patient experience chronic pain ? YES  Severity of pain? (min=0, max=10) 5    Patient Screening                       Data to be shared to Telemed/Office Visits   May 03, 2020 2:38 PM  Screening [place]: [name])  Patient is a falls risk  Chronic Pain, level = 5    ---------- ---------- ----------        .....................................[name]  May 03, 2020 2:38 PM      **Resident Note**      Patient Prep Updates   [place] Pre-Visit Notes:  May 03, 2020 2:38 PM  Screening : [name])  Patient is a falls risk  Chronic Pain, level = 5    ---------- ---------- ----------                   * Preceptor: Sathi (Kinji)  CC: Chronic pain    HPI: Overall pain is doing better; across back and down leg  If not bad, then takes Lyrica and  oxycodone.  Once moving feels better.  Uses oxycodone in the morning or in the afternoon and takes one to two at night  Does not note that Lyrica is making her sleepy at night, but her dose in the morning has been well tolerated.  Continues to work with PT to increase mobility.    Has noted some tender subcutaneous nodules over the areas where [facility] was injected and over the right side of her ribs.  Tender to touch. No fevers/chills.                  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) (ICD10-G62.9)  BACK PAIN, CHRONIC (ICD-724.5) (ICD10-M54.89)  NODULAR RASH (ICD-782.1) (ICD10-R21)  ANNUAL EXAM (18+) (ICD-V70.0) (ICD10-Z00.00)  RUPTURE OF MUSCLE (ICD-848.9) (ICD10-M62.18)  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) (EXB28-U13.24)  BONE LESION - PELVIS (ICD-733.90) (  ZOX09-U04.5)  GANGLION (ICD-727.43) (ICD10-M67.40)  INTERNAL HEMORRHOID (ICD-455.0) (WUJ81-X91.4)  DIVERTICULOSIS, COLON (ICD-562.10) (ICD10-K57.30)  EROSIVE GASTRITIS (ICD-535.40) (ICD10-K29.60)  MUSCLE RUPTURE, NONTRAUMATIC (ICD-728.83) (ICD10-M62.10)  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-Q21.1)  SCREENING EXAM FOR BREAST CANCER (ICD-V76.10) (ICD10-Z12.39)  ANEMIA, OTHER (ICD-285.8) (ICD10-D64.89)  LOWER EXTREMITY EDEMA (ICD-782.3) (ICD10-R60.0)  IRREGULAR HEART BEATS (ICD-427.9) (ICD10-I49.9)  GI BLEED (ICD-578.9) (NWG95-A21.2)  SCLERODERMA (ICD-710.1) (ICD10-M34.9)  SCREENING FOR CERVICAL CANCER (ICD-V76.2) (HYQ65-H84.4)  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) (ICD10-M47.812)  NECK PAIN (ICD-723.1) (ICD10-M54.2)  STEROID USE, LONG TERM (ICD-V58.65) (ICD10-Z79.52)  PREMATURE VENTRICULAR CONTRACTIONS (ICD-427.69) (ICD10-I49.3)  HYPERLIPIDEMIA (ICD-272.4) (ICD10-E78.5)  BACK PAIN, LUMBAR, CHRONIC (ICD-724.2) (ICD10-M54.5)  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)  DIABETES MELLITUS, TYPE II (ICD-250.00) (ICD10-E11.9)  LONG-TERM (CURRENT) USE OF STEROIDS  (ICD-V58.65) (ICD10-Z79.51)  SKIN SAGGING DUE TO WEIGHT LOSS (ICD-757.9) (ICD10-Q84.9)  TRANSAMINASES, SERUM, ELEVATED (ICD-790.4) (ICD10-R74.0)  OBESITY, BMI 30-34.9, ADULT (ICD-278.00) (ICD10-E66.9)      DIABETES MELLITUS, TYPE II, UNCONTROLLED (ICD-250.02) (ICD10-E11.65)      FATTY LIVER DISEASE (ICD-571.8) (ICD10-K76.0)  SCLERODERMA, 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)  HYPOTHYROIDISM (ICD-244.9) (ICD10-E03.9)  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) (ICD10-D80.8)  SPINAL STENOSIS, 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)  HEPATITIS C EXPOSURE (HCV RNA NEGATIVE, 01/2014) (ICD-V02.62) (ICD10-Z20.5)  PAIN IN JOINT, HAND (ICD-719.44) (ICD10-M79.643)  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)  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)  CARPAL TUNNEL (ICD-354.0) (ICD10-G56.00)  OSTEOARTHRITIS (ICD-715.09) (ICD10-M15.9)  S/P ROTATOR CUFF SURGERY 12/2004 - RT SHOULDER; 2008 LT (ICD-V45.89)  Family Hx of MELANOMA, FAMILY HX (ICD-V16.8) (ICD10-Z80.8)  FRACTURE OF OTHER SPEC SITE,  PATHOLOGIC - MULTIPLE (ICD-733.19)  CHOLECYSTECTOMY AND HERNIA REPAIR (ICD-V45.89)  VITAMIN D DEFICIENCY (ICD-268.9) (ICD10-E55.9)  COLONOSCOPY, NEXT 2020- SEE COMMENT (ICD-V76.51) (ICD10-Z01.89)  PROBLEM CHANGES:  Changed problem from BACK PAIN, LUMBAR, CHRONIC (ICD-724.2) (ICD10-M54.5) to LOW BACK PAIN, CHRONIC (ICD-724.2) (ICD10-M54.59)  Changed problem from TRANSAMINASES, SERUM, ELEVATED (ICD-790.4) (ICD10-R74.0) to ELEVATION OF LEVELS OF LIVER TRANSAMINASE LEVELS (ICD10-R74.01)  Changed problem from SCLERODERMA, 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) to SCLERODERMA (ICD-710.1) (ICD10-M34.9)  Added new problem of SUBCUTANEOUS MASS OF CHEST WALL (ICD-786.6) (ONG29-B28.4)         MEDICATIONS:   PAST MEDICINES (prior to today's visit):  * BLOODWORK Please draw chemistry (sodium, potassium, magnesium, chloride, bicarbonate, glucose, creatinine, BUN), CBC, TSH, lipids, HbA1c. Fax result to [phone] Dr. Marlane Hatcher  * RAISED TOILET SEAT   famotidine  20 mg tablet (famotidine) TAKE 1 TABLET BY MOUTH AT NIGHT AS NEEDED  trazodone 100 mg tablet (trazodone) Take 1 tablet by mouth every night   baclofen 10 mg tablet (baclofen) Take 1 tablet by mouth twice a day as needed for back spasms  Jardiance 10 mg tablet (empagliflozin) Take 1 tablet by mouth once a day   oxycodone 5 mg tablet (oxycodone) Take 1 tablet once a day as needed   mycophenolate mofetil 500 mg tablet (mycophenolate mofetil) Take 2 tablet twice a day   hydroxychloroquine 200 mg tablet (hydroxychloroquine) 1 tablet by mouth once a day   * WHEEL CHAIR WITH FOOT PEDALS Use as directed   metformin 500 mg tablet extended release 24 hr (metformin) Take 4 tablet by mouth every morning   lisinopril 2.5 mg tablet (lisinopril) Take 1 tablet by mouth once a day   * FREESTYLE LITE TEST STRP (GLUCOSE BLOOD) Use three times a day   * LOFT STAND CRUTCHES Use as directed   rosuvastatin 10 mg tablet (rosuvastatin) Take 1 tablet by mouth once a day   FreeStyle Lancets 28 gauge 28 gauge (lancets) Use three times  a day   Bactrim DS 800-160 mg tablet (sulfamethoxazole-trimethoprim) Take 1 tablet by mouth twice a day   FreeStyle Freedom Lite kit (blood-glucose meter) Use as directed   ursodiol 500 mg tablet (ursodiol) Take 1 tablet twice a day   * SHOWER CHAIR Use   Gammagard Liquid 10% solution (immun glob g(igg)-gly-iga ov50) every four weeks   * CALCIUM CARBONATE-VITAMIN D 600-200 MG-UNIT TABS (CALCIUM CARBONATE-VITAMIN D) Take 1 tablet twice a day   omeprazole 20 mg capsule,delayed release(DR/EC) (omeprazole) Take 1 capsule by mouth twice a day   * DICLOFENAC SODIUM 1 % TRANSDERMAL GEL Apply 2 gram twice a day   lidocaine 5% adhesive patch,medicated (lidocaine) Apply 1 patch to skin once a day as needed   pregabalin 25 mg capsule (pregabalin) Take 1 capsule by mouth as directed take 1 capsule by mouth once daily in AM and 2 capsules in PM  gabapentin 300 mg capsule (gabapentin) Take 1 capsule by mouth twice a day as  needed every morning and afternoon. For pain, on taper.    MEDICINE CHANGES:  Changed medication from oxycodone 5 mg tablet (oxycodone) Take 1 tablet once a day as needed  to oxycodone 10 mg tablet (oxycodone) Take 1 tablet twice a day as needed Take 1-2 pills as needed - Signed  Changed medication from pregabalin 25 mg capsule (pregabalin) Take 1 capsule by mouth as directed take 1 capsule by mouth once daily in AM and 2 capsules in PM to pregabalin 25 mg capsule (pregabalin) Take 2 capsule by mouth every twelve hours ; Route: BY MOUTH - Signed  Deleted medication of gabapentin 300 mg capsule (gabapentin) Take 1 capsule by mouth twice a day as needed every morning and afternoon. For pain, on taper.; Route: BY MOUTH  Added new medication of oxycodone 10 mg tablet (oxycodone) Take 1 tablet by mouth twice a day as needed Take 1-2 pills as needed for pain; Route: BY MOUTH - Signed  Removed medication of oxycodone 10 mg tablet (oxycodone) Take 1 tablet twice a day as needed Take 1-2 pills as needed - Signed  Changed medication from baclofen 10 mg tablet (baclofen) Take 1 tablet by mouth twice a day as needed for back spasms to baclofen 10 mg tablet (baclofen) Take 1 tablet by mouth twice a day as needed for back spasms; Route: BY MOUTH - Signed  Changed medication from pregabalin 25 mg capsule (pregabalin) Take 2 capsule by mouth every twelve hours  to pregabalin 25 mg capsule (pregabalin) Take 2 capsule by mouth every twelve hours ; Route: BY MOUTH - Signed  Rx of pregabalin 25 mg capsule (pregabalin) Take 2 capsule by mouth every twelve hours ;  #120 capsule x 1;  Signed;  Entered by: Clearence Cheek MD;  Authorized by: Clearence Cheek MD;  Method used: Electronically to [facility] [phone] [address], [place], [place] [address], Ph: [phone] Fax: [phone],   Rx of oxycodone 10 mg tablet (oxycodone) Take 1 tablet by mouth twice a day as needed Take 1-2 pills as needed for pain;  #45 tablet x 0;  Signed;  Entered  by: Clearence Cheek MD;  Authorized by: Clearence Cheek MD;  Method used: [facility] to [facility] [phone] [address], [place], [place] [address], Ph: [phone] Fax: [phone],   Rx of baclofen 10 mg tablet (baclofen) Take 1 tablet by mouth twice a day as needed for back spasms;  #60 tablets x 5;  Signed;  Entered by: Clearence Cheek MD;  Authorized by:  Clearence Cheek MD;  Method used: Electronically [id number] [address], [place],  [address], Ph: [phone] Fax: [phone],          ALLERGIES:          DIRECTIVES:         Vitals:   BP: 105 / 74 mmHg Ht: 62.5 in.  Wt: 153.2 lbs.  BMI (in-lb) 27.67  Temp: 99.4deg F.   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  Will plan to again increase Lyrica and stop Gabapentin with room to increase Lyrica again should needed.  Continue to utilize Oxycodone as needed and hopefully will be able to reduce use over time.  If not, she will require opioid use contract.  - increase to Lyrica 50 mg BID, stop gabapentin  -- if needs increased pain control, can increase Lyrica to 75 mg first at night, then BID  - con't utilize oxycodone 10 mg 1-2 tablets BID as needed; should she continue to require this in 2 months, will need opioid contract  - con't Baclofen, Trazodone, and PT  - RTC in 2 months    #)Subcutaneous nodules of right ribs  Noted on exam.  Bedside POCUS with what appears to be fluid filled pockets like cysts with some density noted.  Largest one is adjacent to rib and painful.  Will perform ribs films to better evaluate.  - rib x-ray of right        * Resident  Signature (.sign): ......................................[name] KitchenTomma Lightning MD -PC 4B-  May 03, 2020 3:29 PM        ORDERS:  Est Level 4 (MDM or 30-39 min) [CPT-99214]  XR Ribs [CPT-71100]  RTC (Return to Clinic) [RTC-000]      **Preceptor Note**     Resident: [name])  Problem Follow-up      Subjective   Patient is a 60 Years Old Female who presents to clinic for follow-up.    Neuropathy - Lyrica increased on last visit to 2 capsules in the evening with discontinuation of PM gabapentin dose.    Had spinal surgery (L4-L5 TLIF) with ongoing pain.  Still taking narcotics.  Doing PT.    Notes painful lesion on abdomen.   I discussed the patient with Dr. Nicholes Stairs Huntley [name]) in a routine precepting encounter.  I also personally interviewed and examined the patient.  I agree with the patient's diagnosis and management.       Objective   General:  Alert, NAD  Head:  Normocephalic, atraumatic  Eyes:  No conjunctival pallor  ENT:  Normal hearing; moist mucous membranes  Pulm:  Normal respiratory effort  MS:  Antalgic gait; no LE edema  Psych:  normal affect; intact memory  POCUS:  cystic lesions, one near rib which is tender has central hyperechoic area    see resident's note for additional details, see resident's note for details.      Assessment   neuropathic pain  spinal surgery s/p TLIF  abdominal lumps - on POCUS cystic appearing, one next to rib    Plan   -  increase Lyrica to 50 mg BID, stop gabapentin  - continue oxycodone as needed  - will order rib films to further assess abdominal lump, likely cystic lesions  See resident note for details, Follow-up per resident note          Patient Care Plan        Immunization Worksheet 2019 (rev 07/09/2019)                             Follow-up With:           Created By [name] on 05/03/2020 at 02:35 PM    Electronically Signed By Clearence Cheek MD on 05/03/2020 at 05:13 PM

## 2020-05-03 NOTE — Progress Notes (Signed)
[name] [name], [name] [name] **DOB:** [birth year] (60 yo F) **Acc No.** [id number] **DOS:**  05/03/2020    ---        [name] [name], [name] [name]**    ------    60 Y old Female, DOB: 1961-04-01, External MRN: [id number]    Account Number: [id number]    [address], ([address] [name], [id number]    Home: [phone]    Guarantor: [name] [name] Insurance: [id number] NHP PPO    PCP: [name] [name] Referring: [name] Penna, MD External Visit ID: [id number]    Appointment Facility: Neurosurgery        * * *    05/03/2020    Progress Notes: [name] Capra, MD **CHN#:** [phone]    ------    ---        **Current Medications**    ---    Taking      * Acetaminophen 500 MG Tablet 2 tablets as needed Orally every 8 hrs, Notes: as needed     ---    * Baclofen 10 MG Tablet 1 tab Orally BID     ---    * Calcium Carbonate-Vitamin D 600-200 MG-UNIT Capsule 1 capsule with a meal Orally Twice a day     ---    * Famotidine 40 MG Tablet 1 tablet at bedtime Orally Once a day, Notes: prn     ---    * Gabapentin 300 MG Capsule 1 capsule Orally three times a day, Notes: only takes 1x mid day (trying to wean to transition to Lyrica)     ---    * Gammagard - Solution as directed Injection once every 4 weeks, Notes: every 4 weeks (infusion); ;next dose 02/20/2020 and will have infusion postop while in hospital per Dr. Lorella [name]     ---    * Glucophage XR 500 mg Tablet Extended Release 24 Hour 4 tablets Orally Once a day     ---    * Hydroxychloroquine Sulfate 200 MG Tablet TAKE 1 TABLET BY MOUTH EVERY DAY WITH FOOD OR MILK     ---    * Jardiance 10 MG Tablet TAKE 1 TABLET BY MOUTH EVERY DAY     ---    * Lidocaine 5 % Patch 1 patch remove after 12 hours Externally PRN, Notes: prn     ---    * Lisinopril 2.5 MG Tablet 1 tablet Orally Once a day     ---    * Loftstrand crutches - 2 lofstrand crutches for ambulation daily     ---    * Lyrica 25 MG Capsule 1 capsule Orally three times a day     ---    * Mycophenolate Mofetil 500 MG Tablet  2 tablet Orally twice daily     ---    * Omeprazole 40 mg Capsule Delayed Release 1 capsule Orally twice daily     ---    * oxyCODONE HCl 10 MG Tablet 1 tablet as needed for postop pain Orally every 6 hrs, Notes: prn     ---    * traZODone HCl     ---    * Ursodiol 500 MG Tablet 1 tablet Orally Twice a day     ---    * Vitamin B Complex - Tablet 1 tablet Orally once daily     ---    * Vitamin D 25 MCG (1000 UT) Tablet 1 tablet Orally Once a day     ---  Not-Taking/PRN    * Rituxan 500 MG/50ML Solution as directed Intravenous , Notes: Last dose 05/19/19     ---    Medication List reviewed and reconciled with the patient    ---      Past Medical History    ---      Scleroderma - CREST dx 2007.        ---    IgG4 deficiency s/p IVIG 2010 ----single infusion given preventively after  week of bilateral knee replacements at [facility] 2010.        ---    Fx left wrist in 1994.        ---    Blood clot at LUE in 2006 on a short course of Coumadin.        ---    Bilateral carpal tunnel syndrome s/p Lt carpal tunnel release and steroids  injection right--.        ---    Bone spur left foot.        ---    Scoliosis.        ---    Spinal stenosis s/p steroids injection.        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 Katrinka [name] 2010--- never infected, but packed with antibiotics with  surgery;.        ---    Right rotator cuff repairs x 4, complicated by repeat tears, infection,  placement of anchor material.        ---    Elevated liver function tests.        ---    Diabetes.        ---    Seronegative RA.        ---    GI bleed .        ---    GERD.        ---    Pagets disease.        ---    Stage II fibrosis of Liver.        ---    Gastroparesis.        ---    COVID-vaccine 05/20/2019, 06/17/2019, booster 12/09/2019 [name] [name]).        ---      **Surgical History**    ---      Right rotator cuff repair, with infected hardware that had to be removed  2006    ---    Repeat right  shoulder surgery, also which became infected. 2007    ---    Left rotator cuff repair 2008    ---    bilateral knee replacements 2009    ---    Left arthroscopic carpal tunnel release 01/2011    ---    ORIF Left 4th metatarsal bone    ---    Left Ulna shortening 1994    ---    Knee replacement 2010    ---    Hand surgery 2013, 2015    ---    remove gallbladder/hernia 1990    ---    Plate left foot 3rd metatarsal 2008    ---    Left reverse TSA 03/07/16    ---    Left Hamstring Repair 05/2019    ---    L4-L5 TLIF (Dr. Dionisio [name]) 03/24/2021    ---      **Family History**    ---      Mother: deceased 45 yrs, lung cancer, hyperthyroidism,  diagnosed with Other  malignant neoplasm of unspecified site    ---    Father: deceased 50 yrs, heart attack, diagnosed with Unspecified heart  disease    ---    Siblings: alive, Brother - scleroderma, COPD    ---    3 brother(s) .    ---    Denies family history of GI cancer or liver disease.    ---      **Social History**    ---    Tobacco history: Never smoked.    Marital Status  Single.    Work/Occupation: Out of work since 03/13/2019 due to current conditions; Works  for [name] in [employer] bi[employer].    Alcohol  Former daily EtOH use in 20s.    Abuse/Neglect  Do you feel unsafe in your relationships? No  , Have you ever  been hit, kicked, punched or otherwise hurt by someone in the past year? No  ,  Plan Resources Provided, Patient states they feel safe to return home.    Illicit drugs: Denies.      **Allergies**    ---      N.K.D.A.    ---    [name] [name] Verified]      **Hospitalization/Major Diagnostic Procedure**    ---      as above    ---    GI bleed 05/2019    ---        **Reason for Appointment**    ---      1\. POST OP    ---      **History of Present Illness**    ---    _NEUROSURGERY_ :    60 year old female presents in post-operative follow-up. She is status post  L4-L5 TLIF on 03/24/2020.    12/29/2019: She had an internal GI bleed in Feb. 2021. She initially  thought  she had a stomach bug. After 3 days of inability to eat/drink, she presented  to the hospital and was told that she had an internal bleed. She had IVF and  antibiotics. She was seen by a gastro here at [facility] who per the patient did  not see any reason for further testing. She has continued to have issues since  this episode. She will have an upset stomach every 8-14 days (emesis and  diarrhea). She has been working with a nutritionist here at [facility] also.    A lesion was found on her liver during her hospitalization. 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 Dr. Lorella [name], her rheumatologist, believed that a  lesion to her right pelvis might be the cause of her symptoms. She had scans  completed.    Since this episode, she started to notice weakness to her lower extremities.    She ruptured her right hamstring in Dec. 2020. She had reconstructed surgery  in Jan. 2021. She started to have pain down her right leg. She saw a PT and a  massage therapist. She was doing therapy on her own also. The right leg  weakness progressed. She had a lumbar injection in Aug. 2021 at [facility] clinic.    Today she reports pain that radiates from her right lower back into right  lateral thigh to the ankle. She presents today with crutches. She cannot walk  more than 20 yards without crutches. She is unable to support her self. She  has needed crutches for 2 months. The symptoms are improved with forward  flexion at the waist.    She  has been told in the past that she has scoliosis and spinal stenosis. This  was diagnosed in 2011/2012. She has not had lumbar spine surgery. She used to  use an inversion table but since she moved she has not been able to use it.    01/26/2020: She had new thoracic and lumbar CT scans completed.    Today she reports that her lower back pain is significant and radiates into  her left hip. The pain will radiate up to her mid-back also. She continues to  have pain that radiates from her right hip to her  ankle. She reports the pain  is so severe today that she is nauseous.    She notes that her mobility has decreased over the last two weeks and an  increase in her pain. She has been having difficulty sleeping due to the pain.  She previous took oxycodone but now only takes baclofen for her symptoms.    She is currently in PT; 2x/week. She was prescribed lofstrand crutches.    She was readmitted to [facility] on 04/20/2020 with pain and lower extremity  swelling and paresthesias.    05/03/2020: Today she reports that she is doing much better. She was discharged  home with prednisone which helped her pain and swelling. Her lower extremity  swelling has resolved.    _Associated Providers_ :    c/o Primary Care Provider.    _Ambulatory Falls and Injury Prevention_ :    HPI Have you experienced a fall in the past year? No, Is the patient using  assistive devices such as a cane or walker? No.      **Vital Signs**    ---    Pain scale **5** , Ht-in 62, Wt-lbs **146** , BMI  **26.70** , BSA **1.70** ,  Wt-kg **66.23** , Wt Change -12 lb.      **Physical Examination**    ---    _NEUROSURGERY_ :    MOTOR Lower Extremities  :  5/5 strength in the bilateral lower extremities  .  Reflexes  Left Knee Jerk  Absent  ,  Right Knee Jerk  Absent  ,  Left Ankle  Jerk  Absent no left achilles  ,  Right Ankle Jerk  2+  . Gait  :  Antalgic  uses crutches  .    _DIAGNOSTIC STUDIES_ :    RADIOLOGY  I reviewed lumbar xrays completed 05/03/2020 at [facility].  .    lumbar incisions well healed without erythema.      **Assessments**    ---    1\. 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 Acquired spondylolisthesis - M43.19    ---      xrays show good position hardware. She had a difficult post-op course but  today reports she feels much better and is happy with the results of surgery  and will f/u prn.    ---      **Follow Up**    ---    prn    Electronically signed by [name] [name] , MD on 05/03/2020 at 03:33  PM EST    Sign off status: Completed        * * *        Neurosurgery    [address] [place], [address]    [place], [place] [address]    Tel: [phone]    Fax: [phone]              * * *  Progress Note: [name] Capra, MD 05/03/2020    ---    Note generated by eClinicalWorks EMR/PM Software ([email or web address])

## 2020-05-03 NOTE — Progress Notes (Signed)
 .  Progress Notes  .  Patient: [name], [name] [facility]  Provider: Corinna Capra I   .  DOB: February 01, 1961 Age: 60 Y Sex: Female  .  PCP: Darrick Penna    Date: 05/03/2020  .  --------------------------------------------------------------------------------  .  REASON FOR APPOINTMENT  .  1. POST OP  .  HISTORY OF PRESENT ILLNESS  .  NEUROSURGERY:  59 year old female presents in  post-operative follow-up. She is status post L4-L5 TLIF on  03/24/2020. 12/29/2019: She had an internal GI bleed in Feb. 2021.  She initially thought she had a stomach bug. After 3 days of  inability to eat/drink, she presented to the hospital and was  told that she had an internal bleed. She had IVF and antibiotics.  She was seen by a gastro here at [facility] who per the patient did  not see any reason for further testing. She has continued to have  issues since this episode. She will have an upset stomach every  8-14 days (emesis and diarrhea). She has been working with a  nutritionist here at [facility] also. A lesion was found on her liver  during her hospitalization. She was told that she had Paget's  Disease. [name], her rheumatologist, believed that a lesion  to her right pelvis might be the cause of her symptoms. She had  scans completed. Since this episode, she started to notice  weakness to her lower extremities. She ruptured her right  hamstring in Dec. 2020. She had reconstructed surgery in Jan.  2021. She started to have pain down her right leg. She saw a PT  and a massage therapist. She was doing therapy on her own also.  The right leg weakness progressed. She had a lumbar injection in  Aug. 2021 at [facility]. Today she reports pain that radiates  from her right lower back into right lateral thigh to the ankle.  She presents today with crutches. She cannot walk more than 20  yards without crutches. She is unable to support her self. She  has needed crutches for 2 months. The symptoms are improved with  forward flexion at the waist.She  has been told in the past that  she has scoliosis and spinal stenosis. This was diagnosed in  2011/2012. She has not had lumbar spine surgery. She used to use  an inversion table but since she moved she has not been able to  use it. 01/26/2020: She had new thoracic and lumbar CT scans  completed. Today she reports that her lower back pain is  significant and radiates into her left hip. The pain will radiate  up to her mid-back also. She continues to have pain that radiates  from her right hip to her ankle. She reports the pain is so  severe today that she is nauseous. She notes that her mobility  has decreased over the last two weeks and an increase in her  pain. She has been having difficulty sleeping due to the pain.  She previous took oxycodone but now only takes baclofen for her  symptoms. She is currently in PT; 2x/week. She was prescribed  lofstrand crutches. She was readmitted to [facility] on 04/20/2020 with  pain and lower extremity swelling and paresthesias. 05/03/2020:  Today she reports that she is doing much better. She was  discharged home with prednisone which helped her pain and  swelling. Her lower extremity swelling has resolved.  .  Associated Providers:  c/o Primary Care Provider  .  [name]  [name]  Ambulatory Falls and Injury Prevention:  HPI  .  Have you experienced a fall in the past year?No , Is the patient  using assistive devices such as a cane or walker?No  .  .  CURRENT MEDICATIONS  .  Taking Acetaminophen 500 MG Tablet 2 tablets as needed Orally  every 8 hrs, Notes: as needed  Taking Baclofen 10 MG Tablet 1 tab Orally BID  Taking Calcium Carbonate-Vitamin D 600-200 MG-UNIT Capsule 1  capsule with a meal Orally Twice a day  Taking Famotidine 40 MG Tablet 1 tablet at bedtime Orally Once a  day, Notes: prn  Taking Gabapentin 300 MG Capsule 1 capsule Orally three times a  day, Notes: only takes 1x mid day (trying to wean to transition  to Lyrica)  Taking Gammagard - Solution as directed Injection once every  4  weeks, Notes: every 4 weeks (infusion); ;next dose 02/20/2020 and  will have infusion postop while in hospital per [name]  Taking Glucophage XR 500 mg Tablet Extended Release 24 Hour 4  tablets Orally Once a day  Taking Hydroxychloroquine Sulfate 200 MG Tablet TAKE 1 TABLET BY  MOUTH EVERY DAY WITH FOOD OR MILK  Taking Jardiance 10 MG Tablet TAKE 1 TABLET BY MOUTH EVERY DAY  Taking Lidocaine 5 % Patch 1 patch remove after 12 hours  Externally PRN, Notes: prn  Taking Lisinopril 2.5 MG Tablet 1 tablet Orally Once a day  Taking Loftstrand crutches - 2 lofstrand crutches for ambulation  daily  Taking Lyrica 25 MG Capsule 1 capsule Orally three times a day  Taking Mycophenolate Mofetil 500 MG Tablet 2 tablet Orally twice  daily  Taking Omeprazole 40 mg Capsule Delayed Release 1 capsule Orally  twice daily  Taking oxyCODONE HCl 10 MG Tablet 1 tablet as needed for postop  pain Orally every 6 hrs, Notes: prn  Taking traZODone HCl  Taking Ursodiol 500 MG Tablet 1 tablet Orally Twice a day  Taking Vitamin B Complex - Tablet 1 tablet Orally once daily  Taking Vitamin D 25 MCG (1000 UT) Tablet 1 tablet Orally Once a  day  Not-Taking/PRN Rituxan 500 MG/50ML Solution as directed  Intravenous , Notes: Last dose 05/19/19  Medication List reviewed and reconciled with the patient  .  PAST MEDICAL HISTORY  .  Scleroderma - CREST dx 2007  IgG4 deficiency s/p IVIG 2010 ----single infusion given  preventively after week of bilateral knee replacements at [facility]  2010  Fx left wrist in 1994  Blood clot at LUE in 2006 on a short course of Coumadin  Bilateral carpal tunnel syndrome s/p Lt carpal tunnel release and  steroids injection right--  Bone spur left foot  Scoliosis  Spinal stenosis s/p steroids injection  s/p bilateral knee replacements for valgus /arthritic  complications, performed by Dr. Katrinka Blazing 2010--- never infected, but  packed with antibiotics with surgery;  Right rotator cuff repairs x 4, complicated by repeat  tears,  infection, placement of anchor material  Elevated liver function tests  Diabetes  Seronegative RA  GI bleed  GERD  Pagets disease  Stage II fibrosis of Liver  Gastroparesis  COVID-vaccine 05/20/2019, 06/17/2019, booster 12/09/2019 (Moderna)  .  ALLERGIES  .  N.K.D.A.  .  SURGICAL HISTORY  .  Right rotator cuff repair, with infected hardware that had to be  removed 2006  Repeat right shoulder surgery, also which became infected. 2007  Left rotator cuff repair 2008  bilateral knee replacements 2009  Left arthroscopic carpal tunnel  release 01/2011  ORIF Left 4th metatarsal bone  Left Ulna shortening 1994  Knee replacement 2010  Hand surgery 2013, 2015  remove gallbladder/hernia 1990  Plate left foot 3rd metatarsal 2008  Left reverse TSA 03/07/16  Left Hamstring Repair 05/2019  L4-L5 TLIF (Dr. Dionisio David) 03/24/2021  .  FAMILY HISTORY  .  Mother: deceased 56 yrs, lung cancer, hyperthyroidism, diagnosed  with Other malignant neoplasm of unspecified site  Father: deceased 59 yrs, heart attack, diagnosed with Unspecified  heart disease  Siblings: alive, Brother - scleroderma, COPD  3 brother(s) .  Denies family history of GI cancer or liver disease.  .  SOCIAL HISTORY  .  .  Tobacco  history: Never smoked.  .  Marital Status Single.  .  Work/Occupation: Out of work since 03/13/2019 due to current  conditions; Works for [employer] in Clinical biochemist.  .  Alcohol Former daily EtOH use in 20s.  .  Abuse/NeglectDo you feel unsafe in your relationships?No , Have  you ever been hit, kicked, punched or otherwise hurt by someone  in the past year? No , PlanResources Provided, Patient states  they feel safe to return home.  .  Illicit drugs: Denies.  [name]  HOSPITALIZATION/MAJOR DIAGNOSTIC PROCEDURE  .  as above  GI bleed 05/2019  .  VITAL SIGNS  .  Pain scale 5, Ht-in 62, Wt-lbs 146, BMI 26.70, BSA 1.70, Wt-kg  66.23, Wt Change -12 lb.  .  PHYSICAL EXAMINATION  .  NEUROSURGERY:  MOTOR Lower Extremities  : 5/5 strength in the bilateral  lower  extremities. :5/5 strength in the bilateral lower extremities.  Reflexes  Left Knee Jerk Absent, Right Knee Jerk Absent, Left  Ankle Jerk Absent no left achilles, Right Ankle Jerk 2+. Left  Knee JerkAbsent , Right Knee JerkAbsent , Left Ankle JerkAbsent  no left achilles , Right Ankle Jerk2+. Gait  : Antalgic uses  crutches. :Antalgic uses crutches.  DIAGNOSTIC STUDIES:  RADIOLOGY  I reviewed lumbar xrays completed 05/03/2020 at [facility]..  lumbar incisions well healed without erythema.  .  ASSESSMENTS  .  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  Acquired spondylolisthesis - M43.19  .  xrays show good position hardware. She had a difficult post-op  course but today reports she feels much better and is happy with  the results of surgery and will f/u prn.  .  FOLLOW UP  .  prn  .  Electronically signed by Corinna Capra , MD on  05/03/2020 at 03:33 PM EST  .  Document electronically signed by Dionisio David, RON I   .

## 2020-05-04 ENCOUNTER — Ambulatory Visit

## 2020-05-04 NOTE — Progress Notes (Signed)
 [facility]  [address]  [place] [address]  Main: [phone]  Fax: [phone]  Patient Portal: [email or web address]      May 05, 2020            MRN: [id number]            DOB: 28-Aug-1960   [name]   [address]   [place] [address]      The results of your recent tests are as follows:      Other Tests:    Ribs Xray = No acute bony abnormality.   An abnormal mass in the soft tissues of the chest is not visualized by plain radiograph. If there is concern for cyst, lipoma, or abscess, ultrasound may be considered. Otherwise, for further evaluation, MR is recommended.     Reviewed test results with patient. Discussed findings and made the following plan:  Discussed with patient who has opted to defer additional testing at this time.  Advised that should things worsen (pain or enlargment of nodule) we would recommend additional work-up.  Will mention to her rheumatologist [name] on Friday as this could be related to slceroderma.                Sincerely,       [name] -PC 4B-   [facility]    Results Provided by:   Phone         Created By Mertha Finders on 05/04/2020 at 12:06 PM    Electronically Signed By [name] -PC 4B- on 05/05/2020 at 04:32 PM

## 2020-05-06 ENCOUNTER — Ambulatory Visit

## 2020-05-07 ENCOUNTER — Ambulatory Visit: Admitting: Rheumatology

## 2020-05-07 ENCOUNTER — Ambulatory Visit

## 2020-05-07 ENCOUNTER — Ambulatory Visit: Admit: 2020-05-07

## 2020-05-07 LAB — HX CHEM-LFT
HX ALANINE AMINOTRANSFERASE (ALT/SGPT): 31 IU/L (ref 0–54)
HX ALKALINE PHOSPHATASE (ALK): 229 IU/L — ABNORMAL HIGH (ref 40–130)
HX BILIRUBIN, TOTAL: 0.4 mg/dL (ref 0.2–1.1)
HX GAMMA GLUTAMYL TRANSFERASE (GGT): 359 IU/L — ABNORMAL HIGH (ref 0–59)

## 2020-05-07 LAB — HX CHEM-OTHER: HX ALBUMIN: 4.2 g/dL (ref 3.4–4.8)

## 2020-05-07 NOTE — Progress Notes (Signed)
 .  Progress Notes  .  Patient: [name], [facility]  Provider: [name]  DOB:Oct 23, 1960 Age: 60 Y Sex: Female  .  PCP: Darrick Penna    Date: 05/07/2020  .  --------------------------------------------------------------------------------  .  HISTORY OF PRESENT ILLNESS  .  Ambulatory Falls and Injury Prevention:  HPI  .  Have you experienced a fall in the past year?No , Is the patient  using assistive devices such as a cane or walker?Yes. Followed  per hospital policy , Do you need assistance with ambulation  while at our facility?No , Interventionsensured environment free  from obstacles and obstructions  .  [name]  Rheumatology:   60 y/o female with a history of seronegative RA and scleroderma,  here for a follow up. Is   5 weeks post-op for L4-L5 TLIF surgery.  Has been feeling a lot better and notes that her feet and legs  are no longer swollen or numb. Hands have been feeling better,  but still ache, are cold, and can't fully straighten. Still has  some pain in her back and right leg which lidocaine patches are  helpful for. Still having some hand twitching. Established a pain  regimen with PCP: lyrica (2 in morning, 2 at night), no  gabapentin, oxycodone, baclofen, trazadone and mycophenolate.  Scribed by Sumner Boast.  .  CURRENT MEDICATIONS  .  Taking Acetaminophen 500 MG Tablet 2 tablets as needed Orally  every 8 hrs, Notes: as needed  Taking Baclofen 10 MG Tablet 1 tab Orally BID  Taking Calcium Carbonate-Vitamin D 600-200 MG-UNIT Capsule 1  capsule with a meal Orally Twice a day  Taking Famotidine 40 MG Tablet 1 tablet at bedtime Orally Once a  day, Notes: prn  Taking Gabapentin 300 MG Capsule 1 capsule Orally three times a  day, Notes: only takes 1x mid day (trying to wean to transition  to Lyrica)  Taking Gammagard - Solution as directed Injection once every 4  weeks, Notes: every 4 weeks (infusion); ;next dose 02/20/2020 and  will have infusion postop while in hospital per [name]  Taking  Glucophage XR 500 mg Tablet Extended Release 24 Hour 4  tablets Orally Once a day  Taking Hydroxychloroquine Sulfate 200 MG Tablet TAKE 1 TABLET BY  MOUTH EVERY DAY WITH FOOD OR MILK  Taking Jardiance 10 MG Tablet TAKE 1 TABLET BY MOUTH EVERY DAY  Taking Lidocaine 5 % Patch 1 patch remove after 12 hours  Externally PRN, Notes: prn  Taking Lisinopril 2.5 MG Tablet 1 tablet Orally Once a day  Taking Loftstrand crutches - 2 lofstrand crutches for ambulation  daily  Taking Lyrica 25 MG Capsule 1 capsule Orally three times a day  Taking Mycophenolate Mofetil 500 MG Tablet 2 tablet Orally twice  daily  Taking Omeprazole 40 mg Capsule Delayed Release 1 capsule Orally  twice daily  Taking oxyCODONE HCl 10 MG Tablet 1 tablet as needed for postop  pain Orally every 6 hrs, Notes: prn  Taking traZODone HCl  Taking Ursodiol 500 MG Tablet 1 tablet Orally Twice a day  Taking Vitamin B Complex - Tablet 1 tablet Orally once daily  Taking Vitamin D 25 MCG (1000 UT) Tablet 1 tablet Orally Once a  day  Not-Taking/PRN Rituxan 500 MG/50ML Solution as directed  Intravenous , Notes: Last dose 05/19/19  Medication List reviewed and reconciled with the patient  .  PAST MEDICAL HISTORY  .  Scleroderma - CREST dx 2007  IgG4 deficiency  s/p IVIG 2010 ----single infusion given  preventively after week of bilateral knee replacements at [facility]  2010  Fx left wrist in 1994  Blood clot at LUE in 2006 on a short course of Coumadin  Bilateral carpal tunnel syndrome s/p Lt carpal tunnel release and  steroids injection right--  Bone spur left foot  Scoliosis  Spinal stenosis s/p steroids injection  s/p bilateral knee replacements for valgus /arthritic  complications, performed by Dr. Katrinka Blazing 2010--- never infected, but  packed with antibiotics with surgery;  Right rotator cuff repairs x 4, complicated by repeat tears,  infection, placement of anchor material  Elevated liver function tests  Diabetes  Seronegative RA  GI bleed  GERD  Pagets disease  Stage II  fibrosis of Liver  Gastroparesis  COVID-vaccine 05/20/2019, 06/17/2019, booster 12/09/2019 (Moderna)  .  ALLERGIES  .  N.K.D.A.  .  SOCIAL HISTORY  .  .  Tobacco  history: Never smoked.  .  Marital Status Single.  .  Work/Occupation: Out of work since 03/13/2019 due to current  conditions; Works for [employer] in Clinical biochemist.  .  Alcohol Former daily EtOH use in 20s.  .  Abuse/NeglectDo you feel unsafe in your relationships?No , Have  you ever been hit, kicked, punched or otherwise hurt by someone  in the past year? No , PlanResources Provided, Patient states  they feel safe to return home.  .  Illicit drugs: Denies.  [name]  REVIEW OF SYSTEMS  .  Rheumatology:  .  General    No fever, weight loss, swollen glands . Muscoskeletal     +Joint pain . Eyes    No vision loss, eye dry, red eye, eye  pain . Mouth    No mouth sores . Cardiovascular    +Raynaud  (whitening right 4th digit); no chest pain , irregular heart  beat, racing heart beat, leg swelling . Pulmonary    No cough,  cough blood, shortness of breath . Gastrointestinal    No  Abdominal pain, N/v, diarrhea, constipation, bloody stools,  heartburn . Skin    No rash, photosensitivity . Lymphatics    No  swollen glands, tender glands . Neurological    +numbness or  tingling, +Localized weakness .  [name]  VITAL SIGNS  .  Pain scale 5, Ht-in 62, Wt-lbs 146, BMI 26.70, BP 93/65, HR 93,  BSA 1.70, Ht-cm 157.48, Wt-kg 66.2397% SpO2.  [name]  EXAMINATION  .  Rheumatology:  General Appearance: Alert and oriented , No apparent distress .  Eyes: No, scleral icterus, scleral erythema .  Skin:No rashes or nail changes, no obvious digital ulceration.  MuskuloskeletalElbows, shoulders, hips, knees have intact ROM.  Hands have apparent synovitis in the 2-3 PIPs and MCPs  bilaterally. She has tenderness at the right lower back lateral  to the sacrum, near but not over SI joints and down along IT band  to mid thigh. Knees, ankles and feet have no evidence of  synovitits..  Skin and Nails Minimal  sclerodactyly, no cracking atthe  fingertips, no ulcers.  .  ASSESSMENTS  .  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  Long-term use of high-risk medication - Z79.899  .  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  She is doing much better. She has minimal residual pain and  numbness from her surgery and notes that her gait is  progressively stronger with PT. Her pain regimen is adequate. She  has hand swelling and pain. I recommend we increase CellCept  and  try to keep inflammation under control. She will continue IVIG  infusions. She is up to date on vaccination. I will plan to see  her back in 2 months for further evaluation.  .  TREATMENT  .  Scleroderma  Continue Lyrica Capsule, 25 MG, 1 capsule, Orally, three times a  day  Continue Mycophenolate Mofetil Tablet, 500 MG, 2 tablet, Orally,  twice daily  Continue Gammagard Solution, -, as directed, Injection, once  every 4 weeks, Notes: every 4 weeks (infusion); ;next dose  02/20/2020 and will have infusion postop while in hospital per  [name]  LAB: Albumin (ALB)  Albumin     4.2     (3.4 - 4.8 - g/dL)  .  [name]  LAB: Alkaline Phosphatase (ALK)  Alkaline Phosphatase (ALK)     229     (40 - 130 - IU/L)  .  [name]  LAB: Bilirubin, Total (TBIL)  Bilirubin, Total     0.4     (0.2 - 1.1 - mg/dL)  .  [name]  LAB: Gamma Glutamyl Transferase (GGT)  Gamma Glutamyl Transferase (GGT)     359     (0 - 59 - IU/L)  .  [name]  LAB: Alanine aminotransferase (ALT)  Alanine Aminotransferase (ALT/SGPT)     31     (0 - 54 - IU/L)  .  FOLLOW UP  .  2 Months  .  Electronically signed by Erasmo Leventhal , MD on  05/22/2020 at 07:49 PM EST  .  Document electronically signed by [name]

## 2020-05-13 ENCOUNTER — Ambulatory Visit: Admitting: Gastroenterology

## 2020-05-13 ENCOUNTER — Ambulatory Visit: Admitting: Physician Assistant

## 2020-05-13 ENCOUNTER — Ambulatory Visit

## 2020-05-13 ENCOUNTER — Ambulatory Visit (HOSPITAL_BASED_OUTPATIENT_CLINIC_OR_DEPARTMENT_OTHER): Admitting: Psychiatry

## 2020-05-13 ENCOUNTER — Ambulatory Visit: Admitting: Registered Nurse

## 2020-05-13 ENCOUNTER — Ambulatory Visit: Admit: 2020-05-13

## 2020-05-13 MED ORDER — Hydroxychloroquine Sulfate: 200 | Tablet | 2 refills | 0 days | Status: DC

## 2020-05-13 MED ORDER — Ursodiol: 500 | Tablet | Freq: Two times a day (BID) | 2 refills | 0 days | Status: AC

## 2020-05-13 NOTE — Progress Notes (Signed)
[name], [name] **DOB:** 06-01-1960 (60 yo F) **Acc No.** [id number] **DOS:**  05/13/2020    ---        [name], [name]**    ------    74 Y old Female, DOB: 06-12-1960, External MRN: [id number]    Account Number: [id number]    [address], [address], [place], [place]-[address]    Home: [phone]    Guarantor: [name] Insurance: [id number]    PCP: [name] Referring: [name] Penna, MD External Visit ID: [id number]    [facility]        * * *    05/13/2020    **Appointment Provider:** [name] LAI Lamount Bankson **CHN#:** [id number]    ------    ---        **Current Medications**    ---    Taking      * Acetaminophen 500 MG Tablet 2 tablets as needed Orally every 8 hrs, Notes: as needed     ---    * Baclofen 10 MG Tablet 1 tab Orally BID     ---    * Calcium Carbonate-Vitamin D 600-200 MG-UNIT Capsule 1 capsule with a meal Orally Twice a day     ---    * Famotidine 40 MG Tablet 1 tablet at bedtime Orally Once a day, Notes: prn     ---    * Gabapentin 300 MG Capsule 1 capsule Orally three times a day, Notes: only takes 1x mid day (trying to wean to transition to Lyrica)     ---    * Gammagard - Solution as directed Injection once every 4 weeks, Notes: every 4 weeks (infusion); ;next dose 02/20/2020 and will have infusion postop while in hospital per Dr. Lorella [name]     ---    * Glucophage XR 500 mg Tablet Extended Release 24 Hour 4 tablets Orally Once a day     ---    * Hydroxychloroquine Sulfate 200 MG Tablet TAKE 1 TABLET BY MOUTH EVERY DAY WITH FOOD OR MILK     ---    * Jardiance 10 MG Tablet TAKE 1 TABLET BY MOUTH EVERY DAY     ---    * Lidocaine 5 % Patch 1 patch remove after 12 hours Externally PRN, Notes: prn     ---    * Lisinopril 2.5 MG Tablet 1 tablet Orally Once a day     ---    * Loftstrand crutches - 2 lofstrand crutches for ambulation daily     ---    * Lyrica 25 MG Capsule 1 capsule Orally three times a day     ---    * Mycophenolate Mofetil 500 MG Tablet 2 tablet Orally  twice daily     ---    * Omeprazole 40 mg Capsule Delayed Release 1 capsule Orally twice daily     ---    * oxyCODONE HCl 10 MG Tablet 1 tablet as needed for postop pain Orally every 6 hrs, Notes: prn     ---    * traZODone HCl     ---    * Ursodiol 500 MG Tablet 1 tablet Orally Twice a day     ---    * Vitamin B Complex - Tablet 1 tablet Orally once daily     ---    * Vitamin D 25 MCG (1000 UT) Tablet 1 tablet Orally Once a day     ---  Not-Taking/PRN    * Rituxan 500 MG/50ML Solution as directed Intravenous , Notes: Last dose 05/19/19     ---      Past Medical History    ---      Scleroderma - CREST dx 2007.        ---    IgG4 deficiency s/p IVIG 2010 ----single infusion given preventively after  week of bilateral knee replacements at [facility] 2010.        ---    Fx left wrist in 1994.        ---    Blood clot at LUE in 2006 on a short course of Coumadin.        ---    Bilateral carpal tunnel syndrome s/p Lt carpal tunnel release and steroids  injection right--.        ---    Bone spur left foot.        ---    Scoliosis.        ---    Spinal stenosis s/p steroids injection.        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 Katrinka Blazing 2010--- never infected, but packed with antibiotics with  surgery;.        ---    Right rotator cuff repairs x 4, complicated by repeat tears, infection,  placement of anchor material.        ---    Elevated liver function tests.        ---    Diabetes.        ---    Seronegative RA.        ---    GI bleed .        ---    GERD.        ---    Pagets disease.        ---    Stage II fibrosis of Liver.        ---    Gastroparesis.        ---    COVID-vaccine 05/20/2019, 06/17/2019, booster 12/09/2019 [name]).        ---      **Surgical History**    ---      Right rotator cuff repair, with infected hardware that had to be removed  2006    ---    Repeat right shoulder surgery, also which became infected. 2007    ---    Left rotator cuff repair  2008    ---    bilateral knee replacements 2009    ---    Left arthroscopic carpal tunnel release 01/2011    ---    ORIF Left 4th metatarsal bone    ---    Left Ulna shortening 1994    ---    Knee replacement 2010    ---    Hand surgery 2013, 2015    ---    remove gallbladder/hernia 1990    ---    Plate left foot 3rd metatarsal 2008    ---    Left reverse TSA 03/07/16    ---    Left Hamstring Repair 05/2019    ---    L4-L5 TLIF (Dr. Dionisio [name]) 03/24/2021    ---      **Family History**    ---      Mother: deceased 46 yrs, lung cancer, hyperthyroidism, diagnosed with Other  malignant neoplasm of unspecified site    ---  Father: deceased 29 yrs, heart attack, diagnosed with Unspecified heart  disease    ---    Siblings: alive, Brother - scleroderma, COPD    ---    3 brother(s) .    ---    Denies family history of GI cancer or liver disease.    ---      **Social History**    ---    Tobacco history: Never smoked.    Marital Status    _Single_    Work/Occupation: Out of work since 03/13/2019 due to current conditions; Works  for [employer] in Clinical biochemist.    Alcohol    _Former daily EtOH use in 20s_    Abuse/Neglect    Do you feel unsafe in your relationships? _No_    Have you ever been hit, kicked, punched or otherwise hurt by someone in the  past year? _No_    Plan _Resources Provided, Patient states they feel safe to return home_    Illicit drugs: Denies.      **Allergies**    ---      N.K.D.A.    ---    [name]]      **Hospitalization/Major Diagnostic Procedure**    ---      as above    ---    GI bleed 05/2019    ---      **Review of Systems**    ---    _GI/Hepatology_ :    Constitutional  No recent weight change, no fever, no fatigue, no change in  appetite  . Gastrointestinal  No loss of appetite, no change in bowel  movement, no nausea or vomiting, no frequent diarrhea, no constipation, no  painful bowl movements, no rectal bleeding, no blood in stool, no abdominal  pain, or peptic ulcer disease   . Cardiovascular  No heart disease, chest pain,  angina, palpitations, shortness of breath, swelling of feet, ankles, or hands  . Neurological  No frequent headaches, lightheaded or dizziness,  seizures/convulsions, tremors, paralysis, stroke, head injury, numbness  .  Respiratory  No chronic cough/phlegm production, spitting up blood, shortness  or breath, asthma or wheezing  . Endocrine  No glandular or hormone problems,  thyroid disease, diabetes, heat/cold intolerance, frequent urination/excessive  thirst  . Hematologic/Lymphatic  Not slow to heal after cuts. No bleeding or  bruising easily, anemia, phlebitis, enlargement of glands, or past blood  transfusion  . Integumentary (Skin, Breast)  No rash or itching, change in  color of skin, change in hair or nails, breast pain, breast lump, or breast  discharge  . Genitourinary  No frequent urination, burning or painful  urination, blood in urine, incontinence, sexual difficulty, or kidney stones  . Musculoskeletal  No joint pain, muscle aches, or joint swelling  .            **Reason for Appointment**    ---      1\. F/U ABNORMAL LFT    ---      **History of Present Illness**    ---    _Ambulatory Falls and Injury Prevention_ :    HPI    Have you experienced a fall in the past year? _No_    Is the patient using assistive devices such as a cane or walker? _No_    Do you need assistance with ambulation while at our facility? _No_    Interventions _none, patient not a fall risk , ensured environment free from  obstacles and obstructions_    _GENERAL_ :  Mrs. Kulzer is a 60 yo woman with history of scleroderma (CREST), IgG4  deficiency, RA, on chronic steroids with prior treatment with Rituxan, GI  bleed in the setting of diverticulosis, and recently diagnosed gastroparesis  who presents for follow up.    Patient was admitted in 05/2018 to local hospital after episodes of melena. She  was found to be anemic and underwent EGD and colonoscopy which showed a  few  gastric erosions and diverticulosis but no active bleeding. No recurrence of  bleeding since. She is also followed in hepatology due to chronically elevated  LFTs.    Interim history 05/13/2020: she has done leg surgery in 03/2020 and she is  recovering well. She has an episode of n/v with self resolved likely food  poisoning. 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 She has  been on prednisone for 10 years and she was put to wean. She is currently on  mycophenolate. She remains Omeprazole 40mg  daily which she reports consistent  use.    CT 08/11/2019 IMPRESSION:    Heterogeneous left superior pubic ramus 2.5 cm focus of sclerosis of uncertain  significance. Diffuse low-density of the liver most likely represents hepatic  steatosis. No focal liver lesion.    RECOMMENDATION:    Pelvis MRI with intravenous contrast to assess left superior pubic ramus bone  lesion.    At her intial visit she reported nausea with early satiety. Gastric emptying  study confirmed delayed gastric emptying. She was seen by our GI dietician  Consuella Lose and improved dramatically with dietary interventions. Episodes  decreased to maybe once a month which she is very happy about.    Recently started baclofen and gabapentin due to chronic pain which she is  schedule to have surgery for. Over the past week has been having increase  discomfort and nausea. Constipation is improved with laxative and fiber  supplement.      **Vital Signs**    ---    Pain scale **0** , Ht-in 62, Wt-lbs **145** , BMI  **26.52** , BP **131/73** ,  HR **108** , BSA **1.69** , O2 **100%RA** , Ht-cm 157.48, Wt-kg **65.77** , Wt  Change -1 lb.      **Physical Examination**    ---    _GENERAL_ :    General Appearance:  W  ell developed, well hydrated, no acute distress  .    Mood/Affect:  Mood and  affect within normal limits.  [name]    HEENT  moist oral mucosa, neck supple  .    Neck  No carotid bruits  ,  supple  .    Skin  Normal  , t  here is no rash or other skin lesions noted  .    Cardiovascular  regular rate and rhythm, normal S1 S2  ,  no murmurs,  extremities warm without swelling  .    Lungs  Clear to auscultation; no wheezes, rhonchi or rales  .    Abdomen  Soft, nontender, nondistended, no organmegaly  ,  normoactive bowel  sounds, no peritoneal signs  .    Extremities  warm and well perfused, healed scars on bilateral knees  .    Musculoskeletal  uncomfortable appearing gate, ambulating with crutches  .    Neuro  alert and  oriented, exam grossly intact  .    Digital rectal exam  Deferred  .          **Assessments**    ---    1\. Upper GI bleeding - K92.2 (Primary)    ---    2\. Scleroderma - M34.9    ---    3\. Abnormal LFTs - R79.89    ---    4\. Diverticulosis - K57.90    ---    5\. Gastric erosion determined by endoscopy - K25.9    ---    6\. Nausea - R11.0    ---    7\. Gastroparesis - K31.84    ---      In summary, 60 yo with longstanding elevated ALK (1.7 xULN) and positive  ANA 1:1280 with underlying multiple autoimmune diseases on chronic prednisone  for 10 years but recently stopped 2/2 side effects. She has risk factors for  autoimmune hepatitis however liver biopsy was not performed 2/2 her knee  surgery schedule. Today she is asymptomatic from liver standpoint. She was put  on ursodiol however no refill for the last few months. 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 She is currently taking  mycophenolate 500mg  BID and just got prednisone low dose. The plan is continue  ursodiol and will plan for liver biopsy in 6 months. She will need to have an  uptodate imaging before liver biopsy. Liver biopsy will be determined at next  visit with the use of establish liver diagnosis.     Plan:    1\. lab and u/s 11/2020 - prefer 8am or 9am - to trend LFTs with ursodiol    2\. appt 2 weeks after #1 with dr Kirtland Bouchard.    ---      **Treatment**    ---      **1\. Upper GI bleeding**    Continue Ursodiol Tablet, 500 MG, 1 tablet, Orally, Twice a day, 90 days, 180  Tablet, Refills 2    _LAB: Albumin (ALB)_    _LAB: Alkaline Phosphatase (ALK)_    _LAB: Bilirubin, Total (TBIL)_    _LAB: Aspartate aminotransferase (AST)_    _LAB: Gamma Glutamyl Transferase (GGT)_    _LAB: Alanine aminotransferase (ALT)_    _LAB: Basic Metabolic Panel (BMP)_    _LAB: PT Prothrombin time/INR (PT)_    _LAB: CBC/DIFF with PLT (CBCWD)_    _IMAGING: US Abdomen- Mid Abdomen_    Notes: 1. lab and u/s 11/2020 - prefer 8am or 9am    2\. appt 2 weeks after #1 with dr Kirtland Bouchard    3\. will check with IR for potential liver bx in 6 months.    ---      **Follow Up**    ---    6 Months    **Appointment Provider:** Leeroy Lovings LAI Chianti Goh    Electronically signed by Casie Sturgeon LAI Rynn Markiewicz , NP on 05/13/2020 at 02:33 PM EST    Sign off status: Completed        * * *        [facility]    [address] [place], [address]    [place], [place] [address]    Tel: [phone]    Fax: [phone]              * * *          Progress Note: Twinkle Sockwell LAI  Rowdy Guerrini 05/13/2020    ---    Note generated by e[employer] EMR/PM Software ([email or web address])

## 2020-05-13 NOTE — Progress Notes (Signed)
 .  Progress Notes  .  Patient: [name], [name]  Provider: NG, [name]    .  DOB: 01-06-1961 Age: 60 Y Sex: Female  .  PCP: [name]    Date: 05/13/2020  .  --------------------------------------------------------------------------------  .  REASON FOR APPOINTMENT  .  1. F/U ABNORMAL LFT  .  HISTORY OF PRESENT ILLNESS  .  Ambulatory Falls and Injury Prevention:  HPI  .  [name]  Have you experienced a fall in the past year?No  Is the patient using assistive devices such as a cane or  walker?No  Do you need assistance with ambulation while at our facility?No  Interventionsnone, patient not a fall risk , ensured environment  free from obstacles and obstructions  .  GENERAL:  [name] is a 60 yo woman with history  of scleroderma (CREST), IgG4 deficiency, RA, on chronic steroids  with prior treatment with Rituxan, GI bleed in the setting of  diverticulosis, and recently diagnosed gastroparesis who presents  for follow up.Patient was admitted in 05/2018 to local hospital  after episodes of melena. She was found to be anemic and  underwent EGD and colonoscopy which showed a few gastric erosions  and diverticulosis but no active bleeding. No recurrence of  bleeding since. She is also followed in hepatology due to  chronically elevated LFTs. Interim history 05/13/2020: she has  done leg surgery in 03/2020 and she is recovering well. She has  an episode of n/v with self resolved likely food poisoning. 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 She has been on prednisone for 10 years and she was  put to wean. She is currently on mycophenolate. She remains  Omeprazole 40mg  daily which she reports consistent use. CT  08/11/2019 IMPRESSION: Heterogeneous left superior pubic ramus 2.5  cm focus of sclerosis of uncertain significance. Diffuse  low-density of  the liver most likely represents hepatic  steatosis. No focal liver lesion. RECOMMENDATION: Pelvis MRI with  intravenous contrast to assess left superior pubic ramus bone  lesion. At her intial visit she reported nausea with early  satiety. 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 She was seen by our GI dietician [name] and  improved dramatically with dietary interventions. Episodes  decreased to maybe once a month which she is very happy about.  Recently started baclofen and gabapentin due to chronic pain  which she is schedule to have surgery for. Over the past week has  been having increase discomfort and nausea. Constipation is  improved with laxative and fiber supplement.  .  CURRENT MEDICATIONS  .  Taking Acetaminophen 500 MG Tablet 2 tablets as needed Orally  every 8 hrs, Notes: as needed  Taking Baclofen 10 MG Tablet 1 tab Orally BID  Taking Calcium Carbonate-Vitamin D 600-200 MG-UNIT Capsule 1  capsule with a meal Orally Twice a day  Taking Famotidine 40 MG Tablet 1 tablet at bedtime Orally Once a  day, Notes: prn  Taking Gabapentin 300 MG Capsule 1 capsule Orally three times a  day, Notes: only takes 1x mid day (trying to wean to transition  to Lyrica)  Taking Gammagard - Solution as directed Injection once every 4  weeks, Notes: every 4 weeks (infusion); ;next dose 02/20/2020 and  will have infusion postop  while in hospital per Dr. Lorella [name]  Taking Glucophage XR 500 mg Tablet Extended Release 24 Hour 4  tablets Orally Once a day  Taking Hydroxychloroquine Sulfate 200 MG Tablet TAKE 1 TABLET BY  MOUTH EVERY DAY WITH FOOD OR MILK  Taking Jardiance 10 MG Tablet TAKE 1 TABLET BY MOUTH EVERY DAY  Taking Lidocaine 5 % Patch 1 patch remove after 12 hours  Externally PRN, Notes: prn  Taking Lisinopril 2.5 MG Tablet 1 tablet Orally Once a day  Taking Loftstrand crutches - 2 lofstrand crutches for ambulation  daily  Taking Lyrica 25 MG Capsule 1 capsule Orally three times a day  Taking  Mycophenolate Mofetil 500 MG Tablet 2 tablet Orally twice  daily  Taking Omeprazole 40 mg Capsule Delayed Release 1 capsule Orally  twice daily  Taking oxyCODONE HCl 10 MG Tablet 1 tablet as needed for postop  pain Orally every 6 hrs, Notes: prn  Taking traZODone HCl  Taking Ursodiol 500 MG Tablet 1 tablet Orally Twice a day  Taking Vitamin B Complex - Tablet 1 tablet Orally once daily  Taking Vitamin D 25 MCG (1000 UT) Tablet 1 tablet Orally Once a  day  Not-Taking/PRN Rituxan 500 MG/50ML Solution as directed  Intravenous , Notes: Last dose 05/19/19  .  PAST MEDICAL HISTORY  .  Scleroderma - CREST dx 2007  IgG4 deficiency s/p IVIG 2010 ----single infusion given  preventively after week of bilateral [facility]  2010  Fx left wrist in 1994  Blood clot at LUE in 2006 on a short course of Coumadin  Bilateral carpal tunnel syndrome s/p Lt carpal tunnel release and  steroids injection right--  Bone spur left foot  Scoliosis  Spinal stenosis s/p steroids injection  s/p bilateral knee replacements for valgus /arthritic  complications, performed by Dr. Katrinka Blazing 2010--- never infected, but  packed with antibiotics with surgery;  Right rotator cuff repairs x 4, complicated by repeat tears,  infection, placement of anchor material  Elevated liver function tests  Diabetes  Seronegative RA  GI bleed  GERD  Pagets disease  Stage II fibrosis of Liver  Gastroparesis  COVID-vaccine 05/20/2019, 06/17/2019, booster 12/09/2019 (Moderna)  .  ALLERGIES  .  N.K.D.A.  .  SURGICAL HISTORY  .  Right rotator cuff repair, with infected hardware that had to be  removed 2006  Repeat right shoulder surgery, also which became infected. 2007  Left rotator cuff repair 2008  bilateral knee replacements 2009  Left arthroscopic carpal tunnel release 01/2011  ORIF Left 4th metatarsal bone  Left Ulna shortening 1994  Knee replacement 2010  Hand surgery 2013, 2015  remove gallbladder/hernia 1990  Plate left foot 3rd metatarsal 2008  Left reverse TSA  03/07/16  Left Hamstring Repair 05/2019  L4-L5 TLIF (Dr. Dionisio David) 03/24/2021  .  FAMILY HISTORY  .  Mother: deceased 44 yrs, lung cancer, hyperthyroidism, diagnosed  with Other malignant neoplasm of unspecified site  Father: deceased 51 yrs, heart attack, diagnosed with Unspecified  heart disease  Siblings: alive, Brother - scleroderma, COPD  3 brother(s) .  Denies family history of GI cancer or liver disease.  .  SOCIAL HISTORY  .  .  Tobacco  history: Never smoked.  .  .  Marital Status  Single  .  [name]  Work/Occupation: Out of work since 03/13/2019 due to current  conditions; Works for [employer] in Clinical biochemist.  .  .  Alcohol  Former daily EtOH use in 20s  .  [name]  Abuse/Neglect  Do you feel unsafe in your relationships?No  Have you ever been hit, kicked, punched or otherwise hurt by  someone in the past year? No  PlanResources Provided, Patient states they feel safe to return  home  .  [name]  Illicit drugs: Denies.  [name]  HOSPITALIZATION/MAJOR DIAGNOSTIC PROCEDURE  .  as above  GI bleed 05/2019  .  REVIEW OF SYSTEMS  .  GI/Hepatology:  .  Constitutional    No recent weight change, no fever, no fatigue,  no change in appetite . Gastrointestinal    No loss of appetite,  no change in bowel movement, no nausea or vomiting, no frequent  diarrhea, no constipation, no painful bowl movements, no rectal  bleeding, no blood in stool, no abdominal pain, or peptic ulcer  disease . Cardiovascular    No heart disease, chest pain, angina,  palpitations, shortness of breath, swelling of feet, ankles, or  hands . Neurological    No frequent headaches, lightheaded or  dizziness, seizures/convulsions, tremors, paralysis, stroke, head  injury, numbness . Respiratory    No chronic cough/phlegm  production, spitting up blood, shortness or breath, asthma or  wheezing . Endocrine    No glandular or hormone problems, thyroid  disease, diabetes, heat/cold intolerance, frequent  urination/excessive thirst . Hematologic/Lymphatic    Not slow to  heal  after cuts. No bleeding or bruising easily, anemia,  phlebitis, enlargement of glands, or past blood transfusion .  Integumentary (Skin, Breast)    No rash or itching, change in  color of skin, change in hair or nails, breast pain, breast lump,  or breast discharge . Genitourinary    No frequent urination,  burning or painful urination, blood in urine, incontinence,  sexual difficulty, or kidney stones . Musculoskeletal    No joint  pain, muscle aches, or joint swelling .  [name]  VITAL SIGNS  .  Pain scale 0, Ht-in 62, Wt-lbs 145, BMI 26.52, BP 131/73, HR 108,  BSA 1.69, O2 100%RA, Ht-cm 157.48, Wt-kg 65.77, Wt Change -1 lb.  .  PHYSICAL EXAMINATION  .  GENERAL:  General Appearance:  Well developed, well hydrated, no acute  distress.  Mood/Affect:  Mood and affect within normal limits.[name]  HEENT  moist oral mucosa, neck supple.  Neck  No carotid bruits, supple.  Skin  Normal, there is no rash or other skin lesions noted.  Cardiovascular  regular rate and rhythm, normal S1 S2, no  murmurs, extremities warm without swelling.  Lungs  Clear to auscultation; no wheezes, rhonchi or rales.  Abdomen  Soft, nontender, nondistended, no organmegaly,  normoactive bowel sounds, no peritoneal signs.  Extremities  warm and well perfused, healed scars on bilateral  knees.  Musculoskeletal  uncomfortable appearing gate, ambulating with  crutches.  Neuro  alert and oriented, exam grossly intact.  Digital rectal exam  Deferred.  .  ASSESSMENTS  .  Upper GI bleeding - K92.2 (Primary)  .  Scleroderma - M34.9  .  Abnormal LFTs - R79.89  .  Diverticulosis - K57.90  .  Gastric erosion determined by endoscopy - K25.9  .  Nausea - R11.0  .  Gastroparesis - K31.84  .  In summary, 60 yo with longstanding elevated ALK (1.7 xULN) and  positive ANA 1:1280 with underlying multiple autoimmune diseases  on chronic prednisone for 10 years but recently stopped 2/2 side  effects. She has risk factors for autoimmune hepatitis however  liver biopsy was not  performed 2/2 her knee  surgery schedule.  Today she is asymptomatic from liver standpoint. She was put on  ursodiol however no refill for the last few months. 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 She is currently taking mycophenolate 500mg   BID and just got prednisone low dose. The plan is continue  ursodiol and will plan for liver biopsy in 6 months. She will  need to have an uptodate imaging before liver biopsy. Liver  biopsy will be determined at next visit with the use of establish  liver diagnosis. Plan: 1. lab and u/s 11/2020 - prefer 8am or 9am  - to trend LFTs with ursodiol2. appt 2 weeks after #1 with dr Kirtland Bouchard.  .  TREATMENT  .  Upper GI bleeding  Continue Ursodiol Tablet, 500 MG, 1 tablet, Orally, Twice a day,  90 days, 180 Tablet, Refills 2  LAB: Albumin (ALB)  .  LAB: Alkaline Phosphatase (ALK)  .  LAB: Bilirubin, Total (TBIL)  .  LAB: Aspartate aminotransferase (AST)  .  LAB: Gamma Glutamyl Transferase (GGT)  .  LAB: Alanine aminotransferase (ALT)  .  LAB: Basic Metabolic Panel (BMP)  .  LAB: PT Prothrombin time/INR (PT)  .  LAB: CBC/DIFF with PLT (CBCWD)  .  US Abdomen- Mid [id number]  Notes: 1. lab and u/s 11/2020 - prefer 8am or 9am  2. appt 2 weeks after #1 with dr Kirtland Bouchard  3. will check with IR for potential liver bx in 6 months.  .  FOLLOW UP  .  6 Months  .  [name]  Appointment Provider: [name] [name]  .  Electronically signed by [name] [name] , NP on  05/13/2020 at 02:33 PM EST  .  Document electronically signed by [name], [name]    .

## 2020-05-19 ENCOUNTER — Ambulatory Visit

## 2020-05-21 ENCOUNTER — Ambulatory Visit

## 2020-05-21 NOTE — Progress Notes (Signed)
* * *      [name], [name] **DOB:** 28-Oct-1960 (60 yo F) **Acc No.** [id number] **DOS:**  05/21/2020    ---        [name], [name]**    ------    [name], DOB: [birth year]    [address], [address], [place], [place] [address]    Home: [phone]    Provider: Vassie Moselle        * * *    Telephone Encounter    ---    Answered by    [name]    Date: 05/21/2020        Time: 10:42 AM    Reason    *infusion issues    ------            Message                      pt is having an issue getting her infusion medication. Can you give her a call to help trouble shoot.  She stated that [facility] sent an order form last night.  She is due for her infusion today.                 Action Taken                      [name],[name]  05/21/2020 10:43:29 AM >new [facility] also left a message stating that the previous orders are expired and they need new orders can call Viviann Spare at [phone] for clarification.      KATCHE,CHRIST Memory Dance 05/21/2020 12:44:50 PM > [name], he will emailed me the form as I have yet to receive them.       KATCHE,CHRIST ANGE G 05/21/2020 12:45:26 PM > Document received, will complete and send back.      KATCHE,CHRIST ANGE G 05/21/2020 1:12:43 PM > Form completed and forwarded to provider for signature.      KATCHE,CHRIST ANGE G 05/21/2020 3:03:03 PM > signed form faxed over to [facility]. Paitent should be all set.                     * * *                ---          * * *          Provider: Vassie Moselle 05/21/2020    ---    Note generated by eClinicalWorks EMR/PM Software ([email or web address])

## 2020-05-23 ENCOUNTER — Ambulatory Visit: Admitting: Rheumatology

## 2020-05-23 LAB — HX CBC W/ DIFF
CASE NUMBER: 2022051001034
HX ABSOLUTE NRBC COUNT: 0 10*3/uL
HX HCT: 40.2 % — NL (ref 36.0–47.0)
HX HGB: 12.7 g/dL — NL (ref 11.8–16.0)
HX MCH: 30.2 pg — NL (ref 26.0–34.0)
HX MCHC: 31.6 g/dL — NL (ref 31.0–37.0)
HX MCV: 95.7 fL — NL (ref 80.0–100.0)
HX MPV: 10.8 fL — NL (ref 9.4–12.4)
HX NRBC PERCENT: 0 % — NL
HX PLATELET: 259 10*3/uL — NL (ref 150.0–400.0)
HX RBC: 4.2 10*6/uL — NL (ref 3.9–5.2)
HX RDW-CV: 13.3 % — NL (ref 11.5–14.5)
HX RDW-SD: 47.1 fL — NL (ref 35.0–51.0)
HX WBC: 6.4 10*3/uL — NL (ref 3.7–11.2)

## 2020-05-23 LAB — HX HEPATIC FUNCTION PANEL
CASE NUMBER: 2022051001034
HX ALBUMIN LVL: 3.5 g/dL — NL (ref 3.2–5.0)
HX ALKALINE PHOSPHATASE: 245 U/L — ABNORMAL HIGH (ref 30.0–117.0)
HX ALT: 41 U/L — NL (ref 6.0–55.0)
HX AST: 19 U/L — NL (ref 6.0–40.0)
HX BILIRUBIN DIRECT: <0.1 — NL (ref 0.0–0.3)
HX BILIRUBIN TOTAL: 0.3 mg/dL — NL (ref 0.2–1.2)
HX TOTAL PROTEIN: 6.4 g/dL — NL (ref 6.0–8.4)

## 2020-05-23 LAB — HX GLOMERULAR FILTRATION RATE (ESTIMATED)
CASE NUMBER: 2022051001034
HX AFN AMER GLOMERULAR FILTRATION RATE: >90
HX NON-AFN AMER GLOMERULAR FILTRATION RATE: >90

## 2020-05-23 LAB — HX BASIC METABOLIC PANEL
CASE NUMBER: 2022051001034
HX ANION GAP: 4 — NL (ref 3.0–11.0)
HX BUN: 22 mg/dL — ABNORMAL HIGH (ref 6.0–20.0)
HX CALCIUM LVL: 8.7 mg/dL — NL (ref 8.5–10.5)
HX CHLORIDE: 106 mmol/L — NL (ref 98.0–110.0)
HX CO2: 26 mmol/L — NL (ref 21.0–32.0)
HX CREATININE: 0.557 mg/dL — NL (ref 0.55–1.3)
HX GLUCOSE LVL: 160 mg/dL — ABNORMAL HIGH (ref 70.0–110.0)
HX POTASSIUM LVL: 4.3 mmol/L — NL (ref 3.6–5.2)
HX SODIUM LVL: 136 mmol/L — NL (ref 136.0–146.0)

## 2020-05-23 LAB — HX C-REACTIVE PROTEIN (CRP)
CASE NUMBER: 2022051001034
HX C-REACTIVE PROTEIN: <0.29 — NL (ref 0.0–0.8)

## 2020-05-23 LAB — HX SEDIMENTATION RATE
CASE NUMBER: 2022051001034
HX SED RATE: 17 mm/h — NL (ref 0.0–30.0)

## 2020-05-23 LAB — HX .AUTOMATED DIFF
CASE NUMBER: 2022051001034
HX ABSOLUTE BASO COUNT: 0.03 10*3/uL — NL (ref 0.0–0.22)
HX ABSOLUTE EOS COUNT: 0.08 10*3/uL — NL (ref 0.0–0.45)
HX ABSOLUTE LYMPHS COUNT: 2.25 10*3/uL — NL (ref 0.74–5.04)
HX ABSOLUTE MONO COUNT: 0.51 10*3/uL — NL (ref 0.0–1.34)
HX ABSOLUTE NEUTRO COUNT: 3.5 10*3/uL — NL (ref 1.48–7.95)
HX BASOPHILS: 0.5 %
HX EOSINOPHILS: 1.3 %
HX IMMATURE GRANULOCYTES: 0.2 % — NL (ref 0.0–2.0)
HX LYMPHOCYTES: 35.3 %
HX MONOCYTES: 8 %
HX NEUTROPHILS: 54.7 %

## 2020-05-25 ENCOUNTER — Ambulatory Visit

## 2020-05-27 ENCOUNTER — Ambulatory Visit

## 2020-06-08 ENCOUNTER — Ambulatory Visit: Admitting: Internal Medicine

## 2020-06-08 ENCOUNTER — Ambulatory Visit

## 2020-06-08 ENCOUNTER — Ambulatory Visit: Admit: 2020-06-08

## 2020-06-08 LAB — HX TOXICOLOGY-DRUG,URINE
HX AMPHETAMINE: NOT DETECTED
HX BARBITUATES: NOT DETECTED
HX BENZODIAZEPINE: NOT DETECTED
HX BUPRENORPHINE, URINE: NOT DETECTED
HX CANNABINOIDS: DETECTED — AB
HX COCAINE: NOT DETECTED
HX FENTANYL, URINE: NOT DETECTED
HX METHADONE: NOT DETECTED
HX OPIATES: NOT DETECTED
HX OXYCODONE: NOT DETECTED
HX U ETHANOL: NOT DETECTED

## 2020-06-08 NOTE — Progress Notes (Signed)
 General Medicine Visit - Resident note with preceptor addendum  .  LDL.    .  * NG:[facility])  .  Patient Details and Vitals  Patient reviewed in/by:  Office  Patient Best Phone # [phone]  Patient Email Address [name]  .  BMI: 26.48  .  BP: 116/ 75  Ht (inches): 62.5  Weight: 146.6  BMI: 26.48  Temp: 98.6  Pulse: 98  O2 Sat: 97  .  Med List: PRINTED by [place] for patient   hd_medl: printed  .  [name]  Patient Medical History   Travel outside of the [place] in past 28 days:: No  .  In the past year, have you ... Had no falls  Difficulty with balance? NO  Use a Cane YES  Use a Walker NO  Need assistance with ambulation while here? NO  .  Falls Intervention - Environment Ensure environment is free from obstacles  .  COVID Patient Symptom Screening   Note any symptoms in the prior 10 days:   In past 14 days, exposure to anyone with above symptoms or positive test?   COVID-19 Exposure: No  In past 14 days, told by public health authoirty or healthcare professional   to quarantine?   COVID-19 Quarantine: No  COVID Vaccine; remind availability for walk-in vaccines. If NO, patient mus  t wear mask..   Been FULLY vaccinated for COVID-19: Yes  COVID Vaccine [place]: ask patient if received the Booster shot?   Booster shot for COVID-19: Yes  Tobacco use? never smoker  Does patient experience chronic pain ? YES  Severity of pain? (min=0, max=10) 5  .  Patient Screening   .  .  .  .  .  .  .  .  .  .  Data to be shared to Telemed/Office Visits   June 08, 2020 2:06 PM  Screening [place]: Johnathan Hausen [facility])  Patient is a falls risk  Fall interventions = Ensure environment is free from obstacles  Chronic Pain, level = 5  .  ---------- ---------- ----------   .  ......................................Marland KitchenMarya Amsler Florio  June 08, 2020 2:06   PM  .  .  .  **Resident Note**  .  .  Patient Prep Updates   [place] Pre-Visit Notes:  June 08, 2020 2:06 PM  Screening : Johnathan Hausen Marya Amsler)  Patient is a falls risk  Fall interventions = Ensure  environment is free from obstacles  Chronic Pain, level = 5  .  ---------- ---------- ----------   .  .  .  .  .  .  .  .  * Preceptor: Foley Clarisse Gouge)  CC: Chronic Pain  .  HPI: - Overall has been significantly better compared to several months ago  .  Pain has become more managable, strength is returning, and walking more   comfortably (now only using cane, previously using crutches).    - Takes oxycodone 10 mg BID, pregabalin 50 mg BID, and baclofen as needed w  ith good effect.  Wants to work on cutting down her oxycodone because she d  oes not like being on opioids.  - Working with physical therapy two times weekly  .  Prior subcutaneous nodules noted over ribs and abdomen have resolved.  .  .  Past Medical History:(reviewed)  -Screening:  --HIV negative (2015), Hepatitis C antibody negative (07/2019)  --Pap + HPV: Normal/Negative 01/17/2019, due in 2025  --Mammo (40-74): Negative (09/2019), due in 2023  --CRC: Completed  05/2019, due in 2026  --DEXA (>65, HR):  Normal bone mass (02/01/2018), next due 2022  .  -Vaccines:  --Flu: Given 02/03/2020  --TDaP: Given 11/04/2014  --Pneumovax: PPSV23 01/26/2017. Due for booster at age 70.  --Zoster (>50): Due for booster now (if not obtained)  --COVID: 1st dose 04/17/19 and 2nd dose 05/08/19; booster 08/2019  .  -Prevention:  --Lipids /T/ A1c: WNL - checked 06/2019  --BP: Normotensive  .  Scleroderma - CREST dx 2007  IgG4 deficiency  Seronegative RA  Stage II fibrosis of Liver  PUD  Blood clot at LUE (2006) completed short course of Coumadin  Bilateral carpal tunnel syndrome s/p left carpal tunnel release /T/ steroid  s injection right  Scoliosis s/p left L4-L5 transforaminal lumbar interbody fusion (02/2020)  Spinal stenosis s/p steroids injection and L4-L5 laminectomy with complete   left L4-L5 facetectomy with  decompression (02/2020)  Diabetes  GERD  Pagets disease  .  PSHx:  CCY and hernia repair (1990)  Left ulna shortening (1994)  Right rotator cuff repair, with infected  hardware that had to be  removed (2006)  Repeat right shoulder surgery, also which became infected (2007)  Left rotator cuff repair (2008)  Plate left foot 3rd metatarsal (2008)  bilateral knee replacements (2009)  Left arthroscopic carpal tunnel release (01/2011)  ORIF Left 4th metatarsal bone  Knee replacement (2010)  Hand surgery 2013, 2015  Left reverse TSA (03/07/16)  Left Hamstring Repair (05/2019)  Left L4-L5 transforaminal lumbar interbody fusion (02/2020)  L4-L5 laminectomy with complete left L4-L5 facetectomy with  decompression (02/2020)  .  Family History: (reviewed)   Father: DMII, died of MI at 84  Mom: died of lung CA at 52  MGM and PGM: lung ca (smokers)  MGF and several other second degree relatives: brain aneurysms in their 23'  s.  2 maternal uncles:melanoma (one died of unknown cancer, one living)  brother recently diagnosed with systemic scleroderma c/b gangrene of fingers  .  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  Separated from   partner of 21 years in June 2021  Non-smoker, rare etoh, no IVDU.  [name]  Review of Systems: Negative except as noted in HPI  .  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) (ICD10-G62.9)  GANGLION (ICD-727.43) (ICD10-M67.40)      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) (ICD10-M47.812)  INTERNAL HEMORRHOID (ICD-455.0) (BMW41-L24.4)           NECK PAIN (ICD-723.1) (ICD10-M54.2)      PAIN IN JOINT, HAND (ICD-719.44) (ICD10-M79.643)  SPINAL STENOSIS, 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)  ANEMIA, OTHER (ICD-285.8) (ICD10-D64.89)      BACK PAIN, CHRONIC (ICD-724.5) (ICD10-M54.89)  SCLERODERMA (ICD-710.1) (ICD10-M34.9)      BONE LESION - PELVIS (ICD-733.90) (ICD10-M89.9)  VITAMIN D DEFICIENCY (ICD-268.9) (ICD10-E55.9)  FATTY LIVER DISEASE (ICD-571.8) (ICD10-K76.0)  ELEVATION OF LEVELS OF LIVER TRANSAMINASE LEVELS (ICD10-R74.01)  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) (ZOX09-U04.54)  DIVERTICULOSIS, COLON (ICD-562.10) (ICD10-K57.30)  GI BLEED (ICD-578.9) (UJW11-B14.2)  LOW BACK PAIN, CHRONIC (ICD-724.2) (ICD10-M54.59)  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)  LONG-TERM (CURRENT) USE OF STEROIDS (ICD-V58.65) (ICD10-Z79.51)      EROSIVE GASTRITIS (ICD-535.40) (ICD10-K29.60)  HYPERLIPIDEMIA (ICD-272.4) (ICD10-E78.5)  DIABETES MELLITUS, TYPE II (ICD-250.00) (ICD10-E11.9)      DIABETES MELLITUS, TYPE II, UNCONTROLLED (ICD-250.02) (ICD10-E11.65)  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-Q21.1)  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)  HYPOTHYROIDISM (ICD-244.9) (ICD10-E03.9)  IRREGULAR HEART BEATS (ICD-427.9) (ICD10-I49.9)  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) (ICD10-D  80.8)  PREMATURE VENTRICULAR CONTRACTIONS (ICD-427.69) (ICD10-I49.3)  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)  SKIN SAGGING DUE TO WEIGHT LOSS (ICD-757.9) (ICD10-Q84.9)  CARPAL TUNNEL (ICD-354.0) (ICD10-G56.00)      OBESITY, BMI 30-34.9, ADULT (ICD-278.00) (ICD10-E66.9)  S/P ROTATOR CUFF SURGERY 12/2004 - RT SHOULDER; 2008 LT (ICD-V45.89)  HEPATITIS C EXPOSURE (HCV RNA NEGATIVE, 01/2014) (ICD-V02.62) (ICD10-Z20.5)  Family Hx of MELANOMA, FAMILY HX (ICD-V16.8) (ICD10-Z80.8)  ANNUAL EXAM (18+) (ICD-V70.0) (ICD10-Z00.00)  CHOLECYSTECTOMY AND HERNIA REPAIR (ICD-V45.89)      SCREENING EXAM FOR BREAST CANCER (ICD-V76.10) (ICD10-Z12.39)  COLONOSCOPY, NEXT 2020- SEE  COMMENT (ICD-V76.51) (ICD10-Z01.89)      SCREENING FOR CERVICAL CANCER (ICD-V76.2) (NWG95-A21.4)  PROBLEM CHANGES:  Removed problem of SUBCUTANEOUS MASS OF CHEST WALL (ICD-786.6) (ICD10-R22.2)  Removed problem of LONG-TERM (CURRENT) USE OF STEROIDS (ICD-V58.65) (ICD10-  Z79.51)  Removed problem of SCLERODERMA (ICD-710.1) (ICD10-M34.9)  Removed problem of RUPTURE OF MUSCLE (ICD-848.9) (ICD10-M62.18)  Removed problem of COLONOSCOPY, NEXT 2020- SEE COMMENT (ICD-V76.51) (ICD10-  Z01.89)  Changed problem from Family history of  MELANOMA, FAMILY HX (ICD-V16.8) (IC  D10-Z80.8) to FAMILY HISTORY OF MELANOMA (ICD-V16.8) (ICD10-Z80.8)  Removed problem of CARPAL TUNNEL (ICD-354.0) (ICD10-G56.00)  Changed problem from 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   (ICD-279.03) (ICD10-D80.8) to IGG4 DEFICIENCY (ICD-279.03) (ICD10-D80.8)  Added new problem of SERONEGATIVE RA OF MULTIPLE SITES (ICD-714.0) (ICD10-M  06.09)  Added new problem of SCOLIOSIS (ICD-737.30) (ICD10-M41.9)  Removed problem of NODULAR RASH (ICD-782.1) (HYQ65-H84)  Added new problem of 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.9)  Removed problem of GANGLION (ICD-727.43) (ICD10-M67.40)  Removed problem of INTERNAL HEMORRHOID (ICD-455.0) (ONG29-B28.4)  Removed problem of LOW BACK PAIN, CHRONIC (ICD-724.2) (ICD10-M54.59)  Removed problem of 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)  Removed problem of DIABETES MELLITUS, TYPE II, UNCONTROLLED (ICD-250.02) (I  CD10-E11.65)  Removed problem of ANEMIA, OTHER (ICD-285.8) (ICD10-D64.89)  Removed problem of HYPOTHYROIDISM (ICD-244.9) (ICD10-E03.9)  Removed problem of S/P ROTATOR CUFF SURGERY 12/2004 - RT SHOULDER; 2008 LT (  ICD-V45.89)  Removed problem of CHOLECYSTECTOMY AND HERNIA REPAIR (ICD-V45.89)  Problems Reviewed:  Done  .  [name]  MEDICATIONS:   PAST MEDICINES (prior to today's visit):  * BLOODWORK Please draw chemistry (sodium, potassium, magnesium, chloride,   bicarbonate, glucose, creatinine,  BUN), CBC, TSH, lipids, HbA1c. Fax result   to [phone] Dr. Marlane Hatcher  * RAISED TOILET SEAT   famotidine 20 mg tablet (famotidine) TAKE 1 TABLET BY MOUTH AT NIGHT AS NEE  DED  Jardiance 10 mg tablet (empagliflozin) Take 1 tablet by mouth once a day   mycophenolate mofetil 500 mg tablet (mycophenolate mofetil) Take 2 tablet t  wice a day   hydroxychloroquine 200 mg tablet (hydroxychloroquine) 1 tablet by mouth onc  e a day   * WHEEL CHAIR WITH FOOT PEDALS Use as directed   metformin 500 mg tablet extended release 24  hr (metformin) Take 4 tablet by   mouth every morning   lisinopril 2.5 mg tablet (lisinopril) Take 1 tablet by mouth once a day   * FREESTYLE LITE TEST STRP (GLUCOSE BLOOD) Use three times a day   * LOFT STAND CRUTCHES Use as directed   rosuvastatin 10 mg tablet (rosuvastatin) Take 1 tablet by mouth once a day   FreeStyle Lancets 28 gauge 28 gauge (lancets) Use three times a day   Bactrim DS 800-160 mg tablet (sulfamethoxazole-trimethoprim) Take 1 tablet   by mouth twice a day   FreeStyle Freedom Lite kit (blood-glucose meter) Use as directed   ursodiol 500 mg tablet (ursodiol) Take 1 tablet twice a day   * SHOWER CHAIR Use   Gammagard Liquid 10% solution (immun glob g(igg)-gly-iga ov50) every four w  eeks   * CALCIUM CARBONATE-VITAMIN D 600-200 MG-UNIT TABS (CALCIUM CARBONATE-VITAM  IN D) Take 1 tablet twice a day   omeprazole 20 mg capsule,delayed release(DR/EC) (omeprazole) Take 1 capsule   by mouth twice a day   * DICLOFENAC SODIUM 1 % TRANSDERMAL GEL Apply 2 gram twice a day   baclofen 10 mg tablet (baclofen) Take 1 tablet by mouth twice a day as need  ed for back spasms  lidocaine 5% adhesive patch,medicated (lidocaine) Apply 1 patch to skin onc  e a day 12 hours on / 12 hours off  oxycodone 10 mg tablet (oxycodone) Take 1 tablet by mouth twice a day as ne  eded Take 1-2 pills as needed for pain  pregabalin 25 mg capsule (pregabalin) Take 2 capsule by mouth every twelve   hours   trazodone 100 mg  tablet (trazodone) Take 1 tablet by mouth every night   .  MEDICINE CHANGES:  Removed medication of * BLOODWORK Please draw chemistry (sodium, potassium,   magnesium, chloride, bicarbonate, glucose, creatinine, BUN), CBC, TSH, lip  ids, HbA1c. Fax result to [phone] Dr. Marlane Hatcher - Signed  Removed medication of Bactrim DS 800-160 mg tablet (sulfamethoxazole-trimet  hoprim) Take 1 tablet by mouth twice a day ; Route: BY MOUTH - Signed  Removed medication of rosuvastatin 10 mg tablet (rosuvastatin) Take 1 table  t by mouth once a day ; Route: BY MOUTH - Signed  Added new medication of ketoconazole 2% cream (ketoconazole) 1 a small amou  nt to affected area once a day Apply small amount to cover affected area on  ce daily; Route: TO AFFECTED AREA - Signed  Added new medication of trazodone 100 mg tablet (trazodone) Take 1 tablet b  y mouth once a day as needed for insomnia, can take 1 extra pill at night i  f needed.; Route: BY MOUTH - Signed  Changed medication from oxycodone 10 mg tablet (oxycodone) Take 1 tablet by   mouth twice a day as needed Take 1-2 pills as needed for pain to oxycodone   10 mg tablet (oxycodone) Take 1 tablet by mouth twice a day as needed Take   1-2 pills as needed for pain; Route: BY MOUTH - Signed  Removed medication of trazodone 100 mg tablet (trazodone) Take 1 tablet by   mouth every night ; Route: BY MOUTH - Signed  Rx of ketoconazole 2% cream (ketoconazole) 1 a small amount to affected are  a once a day Apply small amount to cover affected area once daily;  #15 gra  m x 2;  Signed;  Entered by: [name] -PC 4B-;  Authorized by: [name] -  PC 4B-;  Method used: Electronically to [id number], [place], [place] [address], Ph: [phone] Fax: [phone],   Rx of oxycodone 10 mg tablet (oxycodone) Take 1 tablet by mouth twice a day   as needed Take 1-2 pills as needed for pain;  #56 tablet x 0;  Signed;  En  tered by: [name] -PC 4B-;  Authorized  by: [name] -PC 4B  -;  Method used: Electronically to [id number] [address], [place],   [place] [address], Ph: [phone] Fax: [phone],   Rx of trazodone 100 mg tablet (trazodone) Take 1 tablet by mouth once a day   as needed for insomnia, can take 1 extra pill at night if needed.;  #45 ta  blet x 3;  Signed;  Entered by: [name] -PC 4B-;  Authorized by: Lenna Gilford MD -PC 4B-;  Method used: Electronically to [id number]   [address], [place], [place] [address], Ph: [phone] Fax: [phone],   Medications Reviewed:  Done  .  [name]  ALLERGIES:   No Known Allergies  Allergies Reviewed:  Done  .  [name]  DIRECTIVES:   .  .  .  Vitals:   BP: 116 / 75 mmHg Ht: 62.5 in.  Wt: 146.6 lbs.  BMI (in-lb) 26.48  Temp: 98.6deg F.   Pulse Rate: 98 bpm O2 Sat: 97 %  .  [name]  Additional PE: General: chronically ill appearing woman, NAD  Neuro: exaggerated gait with lateral bending movements, more erect posture   with movement  .  [name]  Assessment /T/ Plan:   #)Chronic pain 2/2 sclerodemra, scoliosis, IgG4 disease, RA  Currently well-controlled of regimen: Oxycodone 10 mg BID, Pregabalin 50 mg   BID, and baclofen as needed.  Will make no changes this visit.  Have signe  d a opioid contract with her as will need this more chronically.  Patient i  s motivated to decrease dose of oxycodone slowly as she can tolerate.    - con't Oxcydone 10 mg BID (opioid contract signed and Utox sent)  - con't Pregabalin 50 mg BID (should consolidate to 50 mg tablet once refil  l due)  - RTC in 3 months  .  #)Insomnia  - refilled Trazodone 100-200 mg nightly  .  .  .  * Resident Signature (.sign): ......................................Marland KitchenIvette Loyal MD -PC 4B-  June 08, 2020 4:20 PM  .  .  .  ORDERS:  Urine, Tox (UDS) [MARIJUANAURN] [CPT-81000]  RTC in 3 months [RTC-090]  Est Level 3 (MDM or 20-29 min) [CPT-99213]  .  .  Marland KitchenPreceptor Note**  Resident: Tomma Lightning)  Problem Follow-up  .  [name]  Subjective   Patient is a 60  Years Old Female who presents to clinic H/o chronic pain, s  clerderma  Off of gabapentin-> on pregabalin  Walking, doing PT   I discussed the patient with Dr. Nicholes Stairs Huntley Dec) in a routine precepting en  counter.  I agree with the patient's diagnosis and management.    .  Discussed Services Due: Yes  .  Objective   see resident's note for details.    .  Assessment   60 yo F with PMH scleroderma and chronic pain requiring opioids here for f/  u visit.  .  Plan   -Opioid contract completed  -UDS collected today  -Continue oxycodone and pregabalin at current doses. Pt motivated  to decrea  se oxycodone dose as tolerate  -Continue PT  .  ......................................Marland KitchenClarisse Gouge Foley DO  June 15, 2020 1:5  9 PM  See resident note for details, Follow-up per resident note  .  Seen On Team (Floor):  4B  .  [name]  .  Patient Care Plan  .  .  .  Immunization Worksheet 2019 (rev 05/13/2020)   .  .  .  .  .  .  .  .  .  .  .  .  .  Follow-up With:   .  .  .  .  Electronically Signed by Prudy Feeler DO on 06/15/2020 at 2:00 PM  ________________________________________________________________________

## 2020-06-08 NOTE — Progress Notes (Signed)
 [facility] June 08, 2020  [address]   [place]  [place] [address]  Main: [phone]  Fax:   Patient Portal: [email or web address]            [name]       MR#: [id number]  [address]  [address]  [place], [place]  [address]       DOB: 23-Sep-1960    Part 1: Controlled Substance Agreement - Opioids    I, [name], am a patient under the care of [name] -PC 4B-, my primary care doctor. As a part of treatment for       chronic pain            I have been given a prescription for     oxycodone             , which is an opioid medication.  The decision was made because my condition is serious and/or other treatments have not helped. I recognize that for my safety, the providers in the Division of Internal Medicine and Adult Primary Care at [facility] have specific policies regarding the chronic use of opioids. In order to continue receiving this type of medication, I agree to the following terms:    I will not request or accept opioid medications from any other physicians or individuals outside of the Division of Internal Medicine and Adult Primary Care at [facility] The only exceptions are if the medication is prescribed while I am in a hospital, or after discussion with my primary care physician.    I will not take any more medication than prescribed unless I speak with my doctor, nurse, nurse practitioner or physician assistant at the office first. Refills will be given for at most a 28 day supply of medication.     I am responsible for my opioid medication. I will not expect to receive additional medication before my next scheduled refill, even if my prescription runs out, or if my prescription or medication is lost, misplaced, or stolen. I am aware that I must report stolen medications to the police.     I am responsible for obtaining refills for my opioid medication. I agree to call at least 72 hours in advance to request refills. I understand  that refills will be made only during regular office hours, 8AM-5PM Monday - Friday. Refills will not be made at night, on holidays or weekends.     I understand that a main treatment goal is to wean off my opioid medication, while minimizing my pain and optimizing my ability to function. In order to achieve this goal, I agree to follow any recommendations my doctor makes for special testing or referrals to specialty clinics, and will attend scheduled appointments. Refusing to participate in testing or evaluation may result in termination of my treatment.     I agree to attend quarterly visits with my care team (MD, PA, or NP) for ongoing management of my chronic pain. I understand the amount of medications I will receive will be in 28 day periods and that I have the option to fill the prescription in a lesser amount.      If my doctor asks, I agree to submit to urine and blood screening to detect the use of non-prescribed medications at any time.   If I test positive for illegal substances, or prescription medications not on my medication list, or if I test negative for  the opioid medication I am being prescribed to take on a regular basis, my physician may stop prescribing    oxycodone           .    I understand that my provider may choose to assess the ongoing use of my opioid medication with more frequent monitoring or alternative strategies if deemed necessary, including but not limited to additional office visits, urine drug screens, and/or pill counts.     I understand that sharing, selling medications, or forging prescriptions will lead to termination of this agreement with possible discharge from the Division of Internal Medicine and Adult Primary Care at [facility].    I will keep my opioid medication out of reach from children.     I will not be involved in any activity that may be dangerous to me or someone else if I feel drowsy or am not thinking clearly. I am aware that even if I do not  notice it, my reflexes and reaction time might still be slowed while I am taking opioid medications. Such activities include, but are not limited to: using heavy equipment or a motor vehicle, working in unprotected heights or being responsible for another individual who is unable to care for himself or herself.     I understand that my physician may decide to wean or terminate my opioid medication at any time if it is not felt to be safe or clinically indicated for me anymore.    I understand that if I violate any of these terms, I may no longer be prescribed opioid medications and/or may be discharged from the Division of Internal Medicine and Adult Primary Care at [facility] upon a 30 day notice of termination.    The specific medications this contract pertains to includes:     Oxycodone                                                    I have read this form or have had it read to me upon request. I understand all of it. I have had a chance to have all of my questions regarding this treatment answered to my satisfaction. By signing this form voluntarily, I agree to the treatment of           chronic back pain                       with        oxycodone                            Patient signature: ____________________________________________________________ Date: June 08, 2020      [name]        Witness signature: ____________________________________________________________ Date: June 08, 2020      [name] -PC 4B-          Part 2: Controlled Substance Consent - Opioids   The following information is related to the opioid medication that is being prescribed. I will read through this information and discuss with my provider so that I understand the risks associated with taking this medication:  I am aware that the use of such medicine has certain risks associated with it, including, but not limited to:  sleepiness or drowsiness (especially when mixed with other sedating medications or  substances like alcohol, benzodiazepines, or muscle relaxants), constipation, nausea, itching, vomiting, dizziness, allergic reaction, slowing of breathing rate, slowing of reflexes or reaction time, physical dependence, tolerance to analgesia, the possibility that the medicine will not provide complete pain relief, addiction, and even death.   The term "opioid overdose" refers to taking enough medication, whether on purpose or by accident, to result in loss of consciousness or even death. I am aware there is a higher chance of this happening to me if I (1) am using high doses of opioids, (2) am taking opioids with other sedating medications (like Ativan, Valium, Xanax, Ambien) or (3) have a personal history of substance abuse or a past overdose. If I have one or more of these risk factors, my provider may discuss carrying naloxone (also known as Narcan). This medication is usually given in the nose and can reverse the effects of an opioid overdose. My provider may give me a prescription for this, but it is also available at most pharmacies without a prescription.   I am aware about the possible risks and benefits of other types of treatments that do not involve the use of opioids. The other treatments discussed included:   non-opioid options, physical therapy                                  I will tell my doctor about all other medicines and treatments that I am receiving.  I will not be involved in any activity that may be dangerous to me or someone else if I feel drowsy or am not thinking clearly. I am aware that even if I do not notice it, my reflexes and reaction time might still be slowed. Such activities include, but are not limited to: using heavy equipment or a motor vehicle, working in unprotected heights or being responsible for another individual who is unable to care for him or herself.    I am aware that certain other medicines such as nalbuphine (NubainT), pentazocine (TalwinT), buprenorphine  (BuprenexT), and butorphanol (StadolT), may reverse the action of the medicine I am using for pain control. Taking any of these other medicines while I am taking my pain medicines can cause symptoms like a bad flu, called a withdrawal syndrome. I agree not to take any of these medicines and to tell any other doctors that I am taking an opioid as my pain medicine and cannot take any of the medicines listed above.  I am aware that addiction is defined as the continued use of a medicine despite its harm, having cravings for  or feeling the need to use a drug for reasons outside of intended use, and a decreased quality of life due to use of a drug. I am aware that the chance of becoming addicted to my opioid pain medicine is variable and ongoing use needs to be monitored very carefully. I am aware that the development of addiction has been reported as up to one-quarter (25%) of individuals who take these medications, and is much more common in a person who has a family or personal history of addiction. I agree to tell my doctor my complete and honest personal drug history and that of my family to the best of my knowledge.  I understand that physical dependence is a normal, expected result of using these medicines for a long time. I  understand that physical dependence is not the same as addiction. I am aware physical dependence means that if my pain medicine use is markedly decreased, stopped or reversed by some of the agents mentioned above, I will experience a withdrawal syndrome. This means I may have any or all of the following: runny nose, yawning, large pupils, goose bumps, abdominal pain and cramping, diarrhea, irritability, aches throughout my body and a flu-like feeling. I am aware that opioid withdrawal is uncomfortable but not life threatening.   I am aware that tolerance to analgesia means that I may require more medicine to get the same amount of pain relief. I am aware that tolerance to analgesia does not  mean that I am addicted to the medication, but may lead to the same doses being less effective at managing my pain. If tolerance occurs, increasing doses may not always help and may cause unacceptable side effects. Tolerance or failure to respond well to opioids may cause my doctor to choose another form of treatment.  (Males Only) I am aware that chronic opioid use has been associated with low testosterone levels in males. This may affect my mood, stamina, sexual desire and physical and sexual performance. I understand that my doctor may check my blood to see if my testosterone level is normal.  (Females Only) If I plan to become pregnant or believe that I have become pregnant while taking this pain medicine, I will immediately call my obstetric doctor and my primary care doctor to inform them. I am aware that, should I carry a baby to delivery while taking these medicines, the baby will be physically dependent upon opioids. I am aware that the use of opioids is not generally associated, with a risk of birth defects. However, birth defects can occur whether or not the mother is on medicines and there is always the possibility that my child will have a birth defect while I am taking an opioid.  I have read this form or have had it read to me upon request. I understand all of it. I have had a chance to have all of my questions regarding this treatment answered to my satisfaction. By signing this form voluntarily, I give my consent for the treatment of my pain with opioid pain medicines.    Patient signature:                                                                                                                                 Date: June 08, 2020    [facility] [name]    Witness signature:  Date: June 08, 2020    [name] -PC 4B-      Created By [name] -PC 4B- on  06/08/2020 at 02:06 PM    Electronically Signed By [name] -PC 4B- on 06/08/2020 at 02:07 PM

## 2020-06-14 ENCOUNTER — Encounter (HOSPITAL_BASED_OUTPATIENT_CLINIC_OR_DEPARTMENT_OTHER)

## 2020-06-14 NOTE — Addendum Note (Signed)
Addended by: Herby Abraham on: 09/19/2020 08:43 AM     Modules accepted: Orders

## 2020-06-21 ENCOUNTER — Ambulatory Visit

## 2020-06-22 ENCOUNTER — Ambulatory Visit: Admitting: Rheumatology

## 2020-06-22 MED ORDER — Mycophenolate Mofetil: 500 | 360 | Freq: Two times a day (BID) | 1 refills | 0 days | Status: AC

## 2020-06-24 ENCOUNTER — Ambulatory Visit

## 2020-06-24 NOTE — Telephone Encounter (Signed)
 Call Details:   Patient PCP = [name] -PC 4B-  [name] (Patient) called on June 24, 2020 3:33 PM.  Message taken by: Blanchie Serve  Primary call-back number: [phone]    Secondary call-back number: [phone] -    Call Reason(s): Prescription        ** REQUESTING A PRESCRIPTION OR REFILL.  [facility] [place] [facility] [address]  [address]  [place]  Phone: [phone]  Fax: [phone]    Provide Prescription to Pharmacy  Medications to refill:  Pregabalin 25 mg capsule : Take 2 capsule by mouth every twelve hours    ---------- ---------- ---------- ---------- ---------- ----------       RESPONSE/ORDERS:    Could you please call pt to clarify whether she is taking gabapentin as well? It looks like she filled an rx for 30 days of gabapentin on 3/8. Ty! ......................................Marland KitchenClarisse Gouge Valene Villa DO  June 24, 2020 3:45 PM    states presc was filled - 06-08-20 and picked up but she has not  been taking it   ......................................Marland KitchenTod Persia RN  June 24, 2020 4:09 PM    Rx sent for pregabalin. 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44) (ICD10-M79.643)  SPINAL STENOSIS, 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)  SCOLIOSIS (ICD-737.30) (ICD10-M41.9)      BACK  PAIN, CHRONIC (ICD-724.5) (ICD10-M54.89)  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.9)      BONE LESION - PELVIS (ICD-733.90) (ICD10-M89.9)  VITAMIN D DEFICIENCY (ICD-268.9) (ICD10-E55.9)  FATTY LIVER DISEASE (ICD-571.8) (ICD10-K76.0)      ELEVATION OF LEVELS OF LIVER TRANSAMINASE LEVELS (ICD10-R74.01)  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) (CWC37-S28.31)  DIVERTICULOSIS, COLON (ICD-562.10) (ICD10-K57.30)  GI BLEED (ICD-578.9) (DVV61-Y07.2)      EROSIVE GASTRITIS (ICD-535.40) (ICD10-K29.60)  HYPERLIPIDEMIA (ICD-272.4) (ICD10-E78.5)  DIABETES MELLITUS, TYPE II (ICD-250.00) (ICD10-E11.9)  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-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)  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)  IRREGULAR HEART BEATS (ICD-427.9) (ICD10-I49.9)  PREMATURE VENTRICULAR CONTRACTIONS (ICD-427.69) (ICD10-I49.3)  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)  SKIN SAGGING DUE TO WEIGHT LOSS (ICD-757.9) (ICD10-Q84.9)      OBESITY, BMI 30-34.9, ADULT (ICD-278.00) (ICD10-E66.9)  HEPATITIS C EXPOSURE (HCV RNA NEGATIVE, 01/2014) (ICD-V02.62) (ICD10-Z20.5)  FAMILY HISTORY OF MELANOMA (ICD-V16.8) (ICD10-Z80.8)  ANNUAL EXAM (18+) (ICD-V70.0) (ICD10-Z00.00)      SCREENING EXAM FOR BREAST CANCER (ICD-V76.10) (ICD10-Z12.39)      SCREENING FOR CERVICAL CANCER (ICD-V76.2) (PXT06-Y69.4)    Meds (prior to this call):   * RAISED TOILET SEAT   famotidine 20 mg tablet (famotidine) TAKE 1 TABLET BY MOUTH AT NIGHT AS NEEDED  Jardiance 10 mg tablet (empagliflozin) Take 1 tablet by mouth once a day   mycophenolate mofetil 500 mg tablet (mycophenolate mofetil) Take 2  tablet twice a day   hydroxychloroquine 200 mg tablet (hydroxychloroquine) 1 tablet by mouth once a day   * WHEEL CHAIR WITH FOOT PEDALS Use as directed   metformin 500 mg tablet extended release 24 hr (metformin) Take 4 tablet by mouth every morning   lisinopril 2.5 mg tablet (lisinopril) Take 1 tablet by mouth once a day   * FREESTYLE LITE TEST STRP (GLUCOSE BLOOD) Use three times a day   * LOFT  STAND CRUTCHES Use as directed   FreeStyle Lancets 28 gauge 28 gauge (lancets) Use three times a day   FreeStyle Freedom Lite kit (blood-glucose meter) Use as directed   ursodiol 500 mg tablet (ursodiol) Take 1 tablet twice a day   * SHOWER CHAIR Use   Gammagard Liquid 10% solution (immun glob g(igg)-gly-iga ov50) every four weeks   * CALCIUM CARBONATE-VITAMIN D 600-200 MG-UNIT TABS (CALCIUM CARBONATE-VITAMIN D) Take 1 tablet twice a day   omeprazole 20 mg capsule,delayed release(DR/EC) (omeprazole) Take 1 capsule by mouth twice a day   * DICLOFENAC SODIUM 1 % TRANSDERMAL GEL Apply 2 gram twice a day   baclofen 10 mg tablet (baclofen) Take 1 tablet by mouth twice a day as needed for back spasms  lidocaine 5% adhesive patch,medicated (lidocaine) Apply 1 patch to skin once a day 12 hours on / 12 hours off  pregabalin 25 mg capsule (pregabalin) Take 2 capsule by mouth every twelve hours   ketoconazole 2% cream (ketoconazole) 1 a small amount to affected area once a day Apply small amount to cover affected area once daily  trazodone 100 mg tablet (trazodone) Take 1 tablet by mouth once a day as needed for insomnia, can take 1 extra pill at night if needed.  oxycodone 10 mg tablet (oxycodone) Take 1 tablet by mouth twice a day as needed Take 1-2 pills as needed for pain    Changes to Meds (this update):   Rx of pregabalin 25 mg capsule (pregabalin) Take 2 capsule by mouth every twelve hours ;  #112 capsule x 1;  Signed;  Entered by: Prudy Feeler DO;  Authorized by: Prudy Feeler DO;  Method used: [id number] [address] [place], [place] [address], Ph: [phone] Fax: [phone],           Created By Blanchie Serve on 06/24/2020 at 03:33 PM    Electronically Signed By Prudy Feeler DO on 06/24/2020 at 04:28 PM

## 2020-06-25 ENCOUNTER — Ambulatory Visit: Admitting: Rheumatology

## 2020-06-25 ENCOUNTER — Ambulatory Visit

## 2020-06-25 MED ORDER — Omeprazole: 40 | 180 | Freq: Two times a day (BID) | 2 refills | 0 days | Status: AC

## 2020-06-26 ENCOUNTER — Other Ambulatory Visit (HOSPITAL_BASED_OUTPATIENT_CLINIC_OR_DEPARTMENT_OTHER): Admitting: Registered Nurse

## 2020-06-26 ENCOUNTER — Other Ambulatory Visit (HOSPITAL_BASED_OUTPATIENT_CLINIC_OR_DEPARTMENT_OTHER): Admitting: Gastroenterology

## 2020-06-29 NOTE — Progress Notes (Signed)
* * *        **  [name]**    --- ---    7 Y old Female, DOB: Feb 03, 1961    [address] [place], [place] [address]    Home: [phone]    Provider: Yetta Numbers        * * *    Telephone Encounter    ---    Answered by   Teddy Spike  Date: 05/12/2016         Time: 08:52 AM    Caller   Patient    --- ---            Reason   Return to work letter            Message                      Hi,      Patient called and she is requesting to speak w/ Erasmo Score in regards of re-faxing the document. Patient states, her job haven't received the document yet: The fax number is [phone] att: Gerlene Burdock.                      Action Taken   Dorrelus,Fedner 05/12/2016 8:55:35 AM > Muscadin,Rymonnce  05/12/2016 10:31:01 AM > Muscadin,Rymonnce 05/12/2016 10:31:01 AM > Patient would  like the letter to be efaxed. phone number is [phone] Muscadin,Rymonnce  05/12/2016 3:06:16 PM > Patient provided an email address to send the documents  as well. Email is [email or web address] Persico,Claudio 05/12/2016 3:13:15 PM >  Faxed form to both fax numbers and emailed.                * * *                ---          * * *          Patient: [name], [name] DOB: [birth year] Provider: Yetta Numbers 05/12/2016    ---    Note generated by eClinicalWorks EMR/PM Software ([email or web address])

## 2020-06-29 NOTE — Progress Notes (Signed)
* * *        **  [name]**    --- ---    19 Y old Female, DOB: 10/25/1960    [address] [place], [place] [address]    Home: [phone]    Provider: Orson Slick        * * *    Telephone Encounter    ---    Answered by   Oletha Blend  Date: 12/21/2017         Time: 04:08 PM    Reason   Prednisone refill    --- ---            Message                      patient called for Prednsione refill but hasnt been seen,can i schedule with you?                Action Taken                      Rahim Astorga  12/21/2017 4:56:56 PM > sounds good! Ok to schedule!      [name],[name]  12/24/2017 11:25:14 AM > spoke with patient and she is schedu;led for 10/4 with you, can you send enought ot get her to the appointment, she is on 10mg  2x a day      Jaiyon Wander  12/24/2017 12:07:15 PM > faxed                Refills  Refill PredniSONE Tablet, 10 MG, Orally, 60 Tablet, 2 tablet, Once a  day, 30 days, Refills=0    --- ---          * * *                ---          * * *          PatientSuan [name] DOB: 1961/01/13 Provider: Orson Slick 12/21/2017    ---    Note generated by eClinicalWorks EMR/PM Software ([email or web address])

## 2020-06-29 NOTE — Progress Notes (Signed)
* * *        [name]**    --- ---    25 Y old Female, DOB: [birth year], External MRN: [id number]    Account Number: [id number]    [address] [place], [place]    Home: [phone]    Guarantor: [name] Insurance: [employer] [place]    PCP: [name] Referring: [name]    Appointment Facility[facility]        * * *    05/18/2017   **Appointment Provider:** Dorian Pod **CHN#:** [id number]    --- ---      **Supervising Provider:** Yetta Numbers, MD    ---         **Reason for Appointment**    ---       1\. [facility] BURSIT[facility]    ---    2\. 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 She reports the shoulder is  doing great and she is very happy with her surgery. In regards to the left  hand, she had a CSI to the IF MCP joint last visit, which provided good  symptom relief. She notes symptoms are not 100% but is happy with the  injection. She reports of a slip and fall that occurred in 03/2017, catching  the side rail with her left hand. She felt pain on RF the next day and has  been sore since then, accompanied with swelling about the entire PIP joint.  Her worse symptom today are located on the lateral aspect of the right hip,  characterized as dull ache with sharp pain at times, and does not radiate.  Denies numbness or paresthesias.       **Current Medications**    ---    Taking     * Baclofen 10 MG Tablet 1/2 tab Orally prn    ---    * Calcium Carbonate-Vitamin D 600-200 MG-UNIT Capsule 1 capsule with a meal Orally Twice a day    ---    * GlipiZIDE XL 5 MG Tablet Extended Release 24 Hour 1 tablet Orally Once a day    ---    * Glucophage XR 500 mg Tablet Extended Release 24 Hour 4 tablets Orally Once a day    ---    *  Hydroxychloroquine Sulfate 200 mg Tablet 1 tablet with food or milk Orally Once a day    ---    * Levothyroxine Sodium 25 MCG Tablet 1 tablet Orally Once a day    ---    * Omeprazole 40 mg Capsule Delayed Release 1 capsule Orally Once a day    ---    * PredniSONE 10 MG Tablet 2 tablet Orally Once a day    ---    * Ranitidine HCl 150 mg Capsule 1 capsule at bedtime Orally Once a day    ---    * Rosuvastatin Calcium 10 MG Tablet 1 tablet Orally Once a day    ---    * Sulindac 150 mg Tablet 1 tablet with food Orally Twice a day    ---    * Tramadol HCl 50 MG Tablet 1 tablet Orally  every 6 hrs    ---    Not-Taking/PRN    * Humira Pen 40 MG/0.8ML Pen-injector Kit 0.8 ml Subcutaneous every 2 weeks    ---    * Voltaren 1 % Gel 2 grams Transdermal twice daily, Notes: PRN    ---    * Medication List reviewed and reconciled with the patient    ---       **Past Medical History**    ---       Scleroderma - CREST dx 2007.        ---    IgG4 deficiency s/p IVIG 2010 ----single infusion given preventively after  week of bilateral knee replacements at [facility] 2010.        ---    Fx left wrist in 1994.        ---    Blood clot at LUE in 2006 on a short course of Coumadin.        ---    Bilateral carpal tunnel syndrome s/p Lt carpal tunnel release and steroids  injection right--.        ---    Bone spur left foot.        ---    Scoliosis.        ---    Spinal stenosis s/p steroids injection.        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 Katrinka Blazing 2010--- never infected, but packed with antibiotics with  surgery;.        ---    Right rotator cuff repairs x 4, complicated by repeat tears, infection,  placement of anchor material.        ---    Elevated liver function tests.        ---    Diabetes.        ---       **Surgical History**    ---       Right rotator cuff repair, with infected hardware that had to be removed  2006    ---    Repeat right shoulder surgery, also which became  infected. 2007    ---    Left rotator cuff repair 2008    ---    bilateral knee replacements 2009    ---    Left arthroscopic carpal tunnel release 01/2011    ---    ORIF Left 4th metatarsal bone    ---    Left Ulna shortening 1994    ---    Knee replacement 2010    ---    Hand surgery 2013, 2015    ---    remove gallbladder/hernia 1990    ---    Plate left foot 3rd metatarsal 2008    ---       **Family History**    ---       Mother: deceased 63 yrs, lung cancer, hyperthyroidism, diagnosed with  Cancer    ---    Father: deceased 58s yrs, heart attack, Heart Disease    ---    3 brother(s) .    ---    FH of arthritis and scleroderma\nFather deceased from MI\nMother deceased age  44 with lung cancer \\nBrother  with scleroderma \/ copd.    ---       **Social History**    ---    Tobacco history: Never smoked.    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    Alcohol  Former daily EtOH use  in 54s.   Nonsmoker.    Lives with longstanding boyfriend.    ---       **Allergies**    ---       N.K.D.A.    ---       **Hospitalization/Major Diagnostic Procedure**    ---       as above    ---      **Review of Systems**    ---     _ORT_ :    Eyes No. Ear, Nose Throat No. Digestion, Stomach, Bowel No. Bladder Problems  No. Bleeding Problems No. Numbness/Tingling No. Anxiety/Depression No.  Fever/Chills/Fatigue No. Chest Pain/Tightness/Palpitations No. Skin Rash No.  Dental Problems No. Joint/Muscle Pain/Cramps Yes. Blackout/Fainting No. Other  No.          **Vital Signs**    ---    Pain scale 7, Ht-in 62, Ht-cm 157.48.       **Physical Examination**    ---    60yo female, well-appearing, in no acute distress. Examination of the left  shoulder reveals incision is well healed. There is no erythema, edema, or bony  deformity. There is no tenderness of the shoulder. ROM: FF 0-180, ER 0-45, IR  0-30 degrees. Rotator cuff takes good resistance. Examination of the left hand  reveals skin is clean and intact. There is swelling and  tenderness of the RF  PIP joint. Pain with hand ROM and comes close to making a fist. RF lacks 20deg  of full extension at PIP with 95deg of flexion. The IF has full extension and  65 of flexion at MCP joint. Hand is warm and well perfused with palpable  radial pulses and neurologic sensation intact to light touch distally.    Radiographs of the left shoulder reviewed by US demonstrate intact hardware,  well aligned without evidence of complications.    Radiographs of the left hand demonstrate mild thumb and index finger CMC joint  arthritis. There is moderate lateral and ring finger PIP joint space narrowing  and mild long finger PIP joint space narrowing, with associated osteophytosis  consistent with osteoarthritis. The joint spaces are otherwise preserved.  Index and long finger MCP joint space narrowing with hook osteophytes are  noted. Positive ulnar variance is appreciated.    Examination of the right hip, gait is antalgic, the skin is intact without  erythema, ecchymosis, or bony deformity. There is TTP over the trochanteric  bursa. Has full and symmetrical ROM. female, well-appearing, in no acute  distress. Negative Stinchfield. Negative SLR. Fires EHL/FHL/GS/TA. Neurologic  sensation intact to light touch distally. Lower extremities are warm and well  perfused with palpable DP and PT pulses.    Radiographs: X-rays of the right hip reviewed by [place], are negative for acute  fracture or dislocation. There is mild to moderate DJD.       **Assessments**    ---    1\. 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 Finger pain, left - M79.645    ---    3\. Trochanteric bursitis, right hip - M70.61    ---       **Treatment**    ---       **1\. Rotator cuff arthropathy of left shoulder**    Notes: The patient was seen and evaluated with Dr [name], who is over 14  months post left TSA. The shoulder is doing well, asymptomatic with  appropriate ROM. Radiographic and clinical findings were  discussed  with her.  Moving forward, we do not restrictions in regards to the shoulder and was  advised to continue with ROM and strengthening exercises as tolerated.    ---        **2\. 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 It appears the fall she experienced at the  end of December exacerbated her hand symptoms. Findings and diagnoses were  reviewed in depth with the patient. We had a discussion regarding treatment  options, including conservative and more aggressive measures. She is not ready  for surgery. Rheumatology is following her and her symptoms may sudside with  medication. In the meantime, we have provided coban wrap for edema control. We  will continue to monitor.        **3\. Trochanteric bursitis, right hip**    Notes: Symptoms, exam and imaging consistent with right hip trochanteric  bursitis. We explained the nature of this and the initial treatment which  includes ice, NSAIDS, and rest. We also offered a corticosteroid injection  which they accepted. Risks and benefits of the procedure were discussed and  include bleeding, infection of the joint, damage to surrounding structures,  failure to relieve symptoms, and need for futher injections or operations in  the future. Patient voiced understanding and wished to proceed. Patient  tolerated this well and had good lidocaine effect. A bandaid was applied. She  will f/u if symptoms fail to resolve or change in nature but will otherwise  follow up on a PRN basis.      **Procedures**    ---    After vernbal consent the lateral aspect of the right hip was prepped with  alcohol soaked gauze. Then a mixture of 3cc of 1%lidocaine and 2cc of  Celestone was injected into the greater troch bursa.    The needle contacted the the greater troch and was backed off 2-34mm so the  needle was in the region of the bursa. There was free flow of injectant.    The patient  tolerated the procedure well. They were advised to limit there  activities, apply ice packs and take NSAID's over the next 24-48hrs. They were  cautioned to monitor for evidence of infection.       **Procedure Codes**    ---       20610 Aspiration/Injection shoulder, hip, knee, subacromial bursa    ---    Z6109 Celestone    ---       **Follow Up**    ---    prn    **Appointment Provider:** Dorian Pod    Electronically signed by Yetta Numbers , MD on 06/18/2017 at 07:35 AM EDT    Sign off status: Completed        * * *        [facility]    [address]    [place], [address]    [place], [place] [address]    Tel: [phone]    Fax: [phone]              * * *          Patient: [name], [name] DOB: Jul 07, 1960 Progress Note: Dorian Pod 05/18/2017    ---    Note generated by eClinicalWorks EMR/PM Software ([email or web address])

## 2020-06-29 NOTE — Progress Notes (Signed)
* * *        **  [name]**    --- ---    65 Y old Female, DOB: 06-Nov-1960    [address], [place], [place] [address]    Home: [phone]    Provider: [name]        * * *    Telephone Encounter    ---    Answered by   Arva Chafe  Date: 09/07/2017         Time: 10:42 AM    Reason   call back?    --- ---            Message                      [name] CMA from [facility], called asking for recent notes/labs for pt's IViG treatment [place]. There's nothing recent on file. PA expires in July. Insurance company was looking for something stating that the treatment is working. Call back [phone]. We do probably need to see the pt again. Last seen 02/2017, 2mon FU but looks like nothing was scheduled.                Action Taken                      can you help with this? [name] is backed up.      [name],[name] , [name] 09/10/2017 3:59:31 PM > Patient will need an appt so we can send updated notes for the PA (expires 09/30/2017). Please schedule appt (perhaps can see [name] on a Friday with [name] in clinic if [name] is booking out too far?) Thanks!       [name],[name]  09/11/2017 10:23:44 AM > transferred patient to call center to schedule appointment with [name] on a Friday.                    * * *                ---          * * *          PatientKimaria [name], [name] DOB: [birth year] Provider: [name] 09/07/2017    ---    Note generated by eClinicalWorks EMR/PM Software ([email or web address])

## 2020-06-29 NOTE — Progress Notes (Signed)
* * *        [name]**    --- ---    44 Y old Female, DOB: Aug 12, 1960, External MRN: [id number]    Account Number: [id number]    [address] [id number]    Home: [phone]    Guarantor: [name] Insurance: [employer]P [place]    PCP: [name] Forts, [name] Referring: [name] Curling, [name]    Appointment Facility: Rheumatology        * * *    10/04/2015  Progress Notes: [name] **CHN#:** [phone]    --- ---    ---        Reason for Appointment    ---      1\. Hand Pain    ---      History of Present Illness    ---     _GENERAL_ :    [name] is here for urgent follow up due to hand pain and swelling. Her  symptoms had been well controlled on low dose prednisone. She ran out of the  medication several days agon and abruptly the pain stiffness and swelling have  returned. She struggles with ADLs as a result of the pain which is mostly in  the PIPs and MCPs. She has had some minor pain in the feet and knees as well.  No fevers or chills, cough or shortness of breath.      Current Medications    ---    Taking     * Ibuprofen     ---    * Levothyroxine Sodium 50 MCG Tablet 1 tablet Once a day    ---    * Nystatin 100000 unit/gm Cream 1 application to affected area Twice a day    ---    * Omeprazole 40 mg Capsule Delayed Release 1 capsule Once a day    ---    * PredniSONE 10 mg Tablet 1 tablet Once a day    ---    * Voltaren 1 % Gel 2 grams twice daily    ---    * METFORMIN TAB 500MG  4 tablets daily    ---    * GLIPIZIDE 5MG  TABLETS 1 tablet daily    ---    Not-Taking/PRN    * Flovent HFA 44 MCG/ACT Aerosol 2 puffs Twice a day    ---      Past Medical History    ---       Scleroderma - CREST dx 2007.        ---    IgG4 deficiency s/p IVIG 2010 ----single infusion given preventively after  week of bilateral knee replacements at [facility] 2010.        ---    Fx left wrist in 1994.        ---    Blood clot at LUE in 2006 on a short course of Coumadin.        ---    Bilateral carpal tunnel syndrome s/p Lt carpal  tunnel release and steroids  injection right--.        ---    Bone spur left foot.        ---    Scoliosis.        ---    Spinal stenosis s/p steroids injection.        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 Katrinka Blazing 2010--- never infected, but packed with  antibiotics with  surgery;.        ---    Right rotator cuff repairs x 4, complicated by repeat tears, infection,  placement of anchor material.        ---    Elevated liver function tests.        ---      Social History    ---    Tobacco history: Never smoked.    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    Alcohol  Former daily EtOH use in 20s.   Nonsmoker.    Lives with longstanding boyfriend.    ---      Allergies    ---      N.K.D.A.    ---      Review of Systems    ---     _ADULT Rheumatology ROS_ :    Constitutional No Recent weight gain, Fatigue, Generalized weakness, Fever,  Chills. Eyes No Pain, Redness, Loss of vision, Double or blurred vision,  Dryness, Feels like something in eye, Itching eyes. HENT No Headache, Loss of  hearing, Sores in mouth, Dryness of mouth. Respiratory **+ Swollen legs or  feet**. Gastrointestinal No Nausea, Vomiting of blood or coffee ground  material, Jaundice, Increasing constipation, Blood in stools, Black stools,  Heartburn, Diarrhea. Genitourinary No Difficult urination, Blood in urine, Pus  in urine. Musculoskeletal **+ Joint pain, Joint swelling, Morning stiffness,  Muscle Pain**. Integumentary (skin and/or breast) No Easy bruising, Rash,  Tightness, Hair loss, Raynaud's phenomenon. Neurological System No Headaches,  Dizziness, Fainting, Muscle spasm, Memory loss, Night sweats. Psychiatric No  Excessive worries, Anxiety, Easily losing temper, Depression, Agitation, Sleep  disturbance, Night sweats. Endocrine No Excessive thirst.  Hematologic/Lymphatic No Swollen glands, Anemia, Bleeding tendency.  Allergic/Immunologic No Frequent sneezing, Increased  susceptibility to  infection, Raynauds. Heart No Chest Pain, Palpitations, Heart murmur,  Irregular heart beat.          Vital Signs    ---    Pain scale 8, Ht-in 62, Wt-lbs 212, BMI 38.77, BP 114/74, HR 84.      Examination    ---     _RAPID 3_ :    Function (0-10): 1.7.        Pain (0-10): 8.5.        Patient Global (0-10): 0.        Rapid 3 (0-30): 10.        Category (HS, MS, LS, R): MS = 6.1-12.    _Rheumatology_ :    General Appearance Alert and oriented , No apparent distress.    HENT: No, alopecia.    Eyes: No, scleral icterus, scleral erythema.    Ext: trace LE edema.    Skin: No, rash.    Muskuloskeletal there is marked synovitis in the PIPs and MCPS of the hands  bilaterally. Mild synovial thickening in the wrists. Elbows, shoulders, hips,  knees have intact ROM with no synovitis. MTP squeeze positive. .          Assessments    ---    1\. 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 Elevated liver enzymes - R74.8    ---    3\. 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 Scleroderma - M34.9    ---      She has a flare of inflammatory arthritis since stopping steroids. To  resolve the acute flare, I will restart the steroids. The long term treatment  is more complicated because of her comorbidities. Her IgG 4 deficiency means  the risk of significant infections when using immunosuppressants. Her liver  disease prevents the use of typical oral DMARDs. She is due to see hepatology  for consideration of liver biopsy. For now, I recommended a trial of Plaquenil  as a steroid sparing agent. We will continue steroids for the next 2 months  until that kicks in. I will plan to see her back around that time.    ---      Treatment    ---       **1\. Inflammatory arthritis**    Start PredniSONE Tablet, 5 mg, 2 tablet, Orally, Once a day, 30 day(s), 60  Tablet, Refills 3    Start Hydroxychloroquine Sulfate Tablet, 200 mg, 1 tablet with food or milk,  Orally, Once a day, 30 days, 30 Tablet, Refills 3    ---       Follow Up    ---    2 Months    Electronically signed by Erasmo Leventhal , [name] on 10/23/2015 at 08:40 PM EDT    Sign off status: Completed        * * *        Rheumatology    [address]    [place], [place] [address]    Tel: [phone]    Fax: [phone]              * * *          Patient: [name], [name] DOB: 06/23/1960 Progress Note: [name]  10/04/2015    ---    Note generated by eClinicalWorks EMR/PM Software ([email or web address])

## 2020-06-29 NOTE — Progress Notes (Signed)
* * *        **  [name]**    --- ---    43 Y old Female, DOB: Aug 28, 1960    [address] [name], [place] [address]    Home: [phone]    Provider: [name]        * * *    Telephone Encounter    ---    Answered by   Griffin Basil  Date: 02/04/2016         Time: 09:36 AM    Caller   Patient    --- ---            Reason   Call back            Message                      Patient says above pharmacy would need a call back from MD before they can refill her Prednisone and Hydroxchloroquine. Patient would also like for MD to give her a contact back best ph: [phone].                 Action Taken   [facility] 02/04/2016 9:37:08 AM > Sateriale,Rachel  02/04/2016 9:42:59 AM > I spoke with the pharmacy and all they needed was a new  script for the two medications which i faxed over. I called the patient and  let her know that you are away until next week.            Refills  Refill Hydroxychloroquine Sulfate Tablet, 200 mg, Orally, 30 Tablet,  1 tablet with food or milk, Once a day, 30 days, Refills=5    --- ---      Refill PredniSONE Tablet, 5 mg, Orally, 60 Tablet, 2 tablet, Once a day, 30  days, Refills=3          * * *                ---          * * *          PatientSARYA, LINENBERGER DOB: Aug 31, 1960 Provider: [name] 02/04/2016    ---    Note generated by eClinicalWorks EMR/PM Software ([email or web address])

## 2020-06-29 NOTE — Progress Notes (Signed)
* * *        [name]**    --- ---    60 Y old Female, DOB: [birth year], External MRN: [id number]    Account Number: [id number]    [address] [place]    Home: [phone]    Guarantor: [name] Insurance: [place]P [place]    PCP: [name], MD Referring: Fortunato Curling, MD    Appointment Facility: Rheumatology        * * *    01/14/2016  Progress Notes: [name], MD **CHN#:** [id number]    --- ---    ---        Reason for Appointment    ---      1\. FU/HAND SWELLING    ---      History of Present Illness    ---     _GENERAL_ :    She is still experiencing bilateral hand pain in her PIP and MCP joints. The  pain improved after steroids and she cut back, then it all came right back.  Now increased doses of steroids have not helped very much. She denies any  fevers chills or recent infections. No other joint pains.      Current Medications    ---    Taking     * GLIPIZIDE 5MG  TABLETS 1 tablet daily    ---    * Hydroxychloroquine Sulfate 200 mg Tablet 1 tablet with food or milk Orally Once a day    ---    * Levothyroxine Sodium 50 MCG Tablet 1 tablet Orally Once a day    ---    * METFORMIN TAB 500MG  4 tablets daily    ---    * Omeprazole 40 mg Capsule Delayed Release 1 capsule Orally Once a day    ---    * PredniSONE 5 mg Tablet 2 tablet Orally Once a day    ---    * Voltaren 1 % Gel 2 grams Transdermal twice daily, Notes: PRN    ---    Not-Taking/PRN    * Ibuprofen     ---    * Medication List reviewed and reconciled with the patient    ---      Past Medical History    ---       Scleroderma - CREST dx 2007.        ---    IgG4 deficiency s/p IVIG 2010 ----single infusion given preventively after  week of bilateral knee replacements at [facility] 2010.        ---    Fx left wrist in 1994.        ---    Blood clot at LUE in 2006 on a short course of Coumadin.        ---    Bilateral carpal tunnel syndrome s/p Lt carpal tunnel release and steroids  injection right--.        ---    Bone spur left  foot.        ---    Scoliosis.        ---    Spinal stenosis s/p steroids injection.        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 Katrinka Blazing 2010--- never infected, but packed with antibiotics with  surgery;.        ---    Right rotator cuff repairs x 4, complicated by repeat tears, infection,  placement of anchor material.        ---    Elevated liver function tests.        ---      Family History    ---      Mother: deceased 52 yrs, lung cancer, hyperthyroidism, diagnosed with Cancer    ---    Father: deceased 90s yrs, heart attack, diagnosed with Heart Disease    ---    3 brother(s) .    ---    FH of arthritis and scleroderma    Father deceased from MI    Mother deceased age 21 with lung cancer    Brother with scleroderma / copd.    ---      Social History    ---    Tobacco history: Never smoked.    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    Alcohol  Former daily EtOH use in 20s.   Nonsmoker.    Lives with longstanding boyfriend.    ---      Allergies    ---      N.K.D.A.    ---      Review of Systems    ---     _ADULT Rheumatology ROS_ :    Constitutional No Recent weight gain, Fatigue, Chills. Eyes No Pain, Loss of  vision, Itching eyes. HENT No Headache, Loss of hearing, Sores in mouth.  Respiratory No Shortness of breath, Cough, Hemoptysis. Gastrointestinal No  Nausea, Heartburn, Diarrhea. Genitourinary No Difficult urination, Pus in  urine, Blood in urine. Musculoskeletal **+ Joint pain, Joint swelling, Muscle  Pain**. Integumentary (skin and/or breast) No Easy bruising, Rash, Sun  sensitive (sun allergy). Neurological System **+Numbness or Tingling**.  Psychiatric No Excessive worries, Depression, Night sweats. Endocrine No  Excessive thirst. Hematologic/Lymphatic No Swollen glands, Anemia,  Lymphadenopathy. Allergic/Immunologic No Frequent sneezing, Increased  susceptibility to infection, Raynauds. Heart No Chest Pain, Heart murmur,  Irregular  heart beat.          Vital Signs    ---    Pain scale 7, Ht-in 62, Wt-lbs 222, BMI 40.60, BP 138/85, HR 97, BSA 2.10, Ht-  cm 157.48, Wt-kg 100.7, Wt Change 6 lb.      Examination    ---     _RAPID 3_ :    Function (0-10): 0.        Pain (0-10): 7.0.        Patient Global (0-10): 0.    _Rheumatology_ :    General Appearance Alert and oriented , No apparent distress .    HENT: No, alopecia .    Eyes: No, scleral icterus, scleral erythema .    CV: regular rate rhythm .    Pulm: clear to auscultation bilaterally .    Ext: trace LE edema .    Skin: No, rash .    Neuro: mildly positive Phalen's test .    Psych: No anxiety depression.    Muskuloskeletal Severe synovitis in the left PIPs and MCPS of the hand, mild  on the right. Mild synovial thickening in the wrists. Elbows, shoulders, hips,  knees have intact ROM with no synovitis. .    Skin and Nails Minimal sclerodactyly.          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 Elevated liver enzymes - R74.8    ---  She still has severe synovitis. 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 There is also significant risk of infection due  to her IgG4 deficiency. She has not required IVIG for a long time. We  discussed the risks and benefits of various therapies and settled on a trial  of enbrel. We settled on this because of its frequent dosing schedule,  allowing [place] to more readily discontinue it if she gets infected. We discussed  the importance of hand hygiene and avoiding obviously sick contacts. She  understands and accepts the risk. We will complete the PA and proceed with  starting therapy. Also will verify TB status. With respect to steroids, I  recommended she stay at her current dose until the enbrel starts working, 4-6  weeks from now.    ---      Treatment    ---       **1\. Seronegative rheumatoid arthritis**    Continue Hydroxychloroquine  Sulfate Tablet, 200 mg, 1 tablet with food or  milk, Orally, Once a day    Continue Omeprazole Capsule Delayed Release, 40 mg, 1 capsule, Orally, Once a  day    Continue PredniSONE Tablet, 5 mg, 2 tablet, Orally, Once a day    ---         **2\. Others**    Start Enbrel SureClick Solution Auto-injector, 50 MG/ML, inject 50mg  (1 pen)  sc, Subcutaneous, every week, 28 days, 4, Refills 3      Follow Up    ---    2 Months    Electronically signed by Erasmo Leventhal , MD on 01/21/2016 at 09:35 AM EDT    Sign off status: Completed        * * *        Rheumatology    [address]    [place], [place] [address]    Tel: [phone]    Fax: [phone]              * * *          Patient: [name], [name] DOB: Oct 13, 1960 Progress Note: [name], MD  01/14/2016    ---    Note generated by eClinicalWorks EMR/PM Software ([email or web address])

## 2020-06-29 NOTE — Progress Notes (Signed)
* * *        [name]**    --- ---    60 Y old Female, DOB: April 10, 1960, External MRN: [id number]    Account Number: [id number]    [address] [name], [place]    Home: [phone]    Guarantor: [name] Insurance: [place]P [place]    PCP: [name], MD Referring: Fortunato Curling, MD    Appointment Facility: [facility]        * * *    01/24/2016   **Appointment Provider:** Guido Sander, PA **CHN#:**  [id number]    --- ---      **Supervising Provider:** Yetta Numbers, MD    ---        Reason for Appointment    ---      1\. Left shoulder pain    ---      History of Present Illness    ---     _GENERAL_ :    [name] is a 60 year old right-hand dominant female who presents for evaluation  of left shoulder pain. She was working on her stairs a week ago, Sunday, and  used her left arm to move over, feeling something tear across the deltoid  area. She had been seen by her rheumatologist the previous Friday for some  upper back pain and had been prescribed PT. Since then, she has been taking  tramadol and baclofen that is prescribed by her PCP for pain with minimal  relief. She has limited range of motion of the shoulder currently, unable to  fully extend over head or to the side. Prior to feeling it something tear, she  had full shoulder range of motion and no pain. She denies numbness or tingling  of the upper extremity.      Current Medications    ---    Taking     * Baclofen 10 MG Tablet 1/2 tab Orally prn    ---    * Enbrel SureClick 50 MG/ML Solution Auto-injector inject 50mg  (1 pen) sc Subcutaneous every week    ---    * GLIPIZIDE 5MG  TABLETS 1 tablet daily    ---    * Hydroxychloroquine Sulfate 200 mg Tablet 1 tablet with food or milk Orally Once a day    ---    * Levothyroxine Sodium 50 MCG Tablet 1 tablet Orally Once a day    ---    * METFORMIN TAB 500MG  4 tablets daily    ---    * Omeprazole 40 mg Capsule Delayed Release 1 capsule Orally Once a day    ---    * PredniSONE  5 mg Tablet 2 tablet Orally Once a day    ---    * Tramadol HCl 50 MG Tablet 1 tablet Orally every 6 hrs    ---    * Voltaren 1 % Gel 2 grams Transdermal twice daily, Notes: PRN    ---    * Medication List reviewed and reconciled with the patient    ---      Past Medical History    ---       Scleroderma - CREST dx 2007.        ---    IgG4 deficiency s/p IVIG 2010 ----single infusion given preventively after  week of bilateral knee replacements at [facility] 2010.        ---    Fx left wrist in 1994.        ---  Blood clot at LUE in 2006 on a short course of Coumadin.        ---    Bilateral carpal tunnel syndrome s/p Lt carpal tunnel release and steroids  injection right--.        ---    Bone spur left foot.        ---    Scoliosis.        ---    Spinal stenosis s/p steroids injection.        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 Katrinka Blazing 2010--- never infected, but packed with antibiotics with  surgery;.        ---    Right rotator cuff repairs x 4, complicated by repeat tears, infection,  placement of anchor material.        ---    Elevated liver function tests.        ---      Surgical History    ---      Right rotator cuff repair, with infected hardware that had to be removed  2006    ---    Repeat right shoulder surgery, also which became infected. 2007    ---    Left rotator cuff repair 2008    ---    bilateral knee replacements 2009    ---    Left arthroscopic carpal tunnel release 01/2011    ---    ORIF Left 4th metatarsal bone    ---    Left Ulna shortening 1994    ---      Family History    ---      Mother: deceased 50 yrs, lung cancer, hyperthyroidism, diagnosed with Cancer    ---    Father: deceased 25s yrs, heart attack, diagnosed with Heart Disease    ---    3 brother(s) .    ---    FH of arthritis and scleroderma    Father deceased from MI    Mother deceased age 72 with lung cancer    Brother with scleroderma / copd.    ---      Social History    ---     Tobacco history: Never smoked.    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    Alcohol    _Former daily EtOH use in 20s_   Nonsmoker.    Lives with longstanding boyfriend.    ---      Allergies    ---      N.K.D.A.    ---      Hospitalization/Major Diagnostic Procedure    ---      as above    ---      Review of Systems    ---     _ORT_ :    Eyes No. Ear, Nose Throat No. Digestion, Stomach, Bowel No. Bladder Problems  No. Bleeding Problems No. Numbness/Tingling Yes. Anxiety/Depression No.  Fever/Chills/Fatigue No. Chest Pain/Tightness/Palpitations No. Skin Rash No.  Dental Problems No. Joint/Muscle Pain/Cramps Yes. Blackout/Fainting No. Other  No.          Vital Signs    ---    Pain scale 10, Ht-in 62.      Physical Examination    ---    Well-appearing female in no acute distress. On examination of the left upper  extremity, skin is intact. There is no swelling or bony deformity. No  tenderness of the clavicle, biceps tendon, or posterior glenohumeral joint.  She has focal tenderness of the AC joint and tenderness over the periscapular  area where there is muscle spasm appreciated. She is tender along the belly of  the deltoid. Active forward elevation 80 degrees, passively improves to 160  though was painful. She is able to hold the shoulder 160 without resistance.  External rotation 30 degrees and internal rotation to the upper lumbar spine  within 1 level of the contralateral side. She gives good resistance with  subscap and infraspinatus testing. Very limited strength with supraspinatus  and deltoid testing. She has significant pain with supraspinatus testing.  Sensation intact to light touch in the axillary, radial, median, and ulnar  nerve distributions. Hand is warm and well perfused with 2+ radial pulse.    IMAGING: Radiographs of the left shoulder obtained today demonstrate no  evidence of acute fracture. The humeral head rides high in the socket, which  was also seen previously on x-rays in 2016.  Suture anchors are visualized.      Assessments    ---    1\. Acute pain of left shoulder - M25.512 (Primary)    ---      Treatment    ---       **1\. Others**    Notes: The patient was seen and evaluated with Dr. [name]. X-rays are  reviewed demonstrating evidence of rotator cuff arthropathy. Given her injury  and symptoms, we have a high suspicion for retear of her rotator cuff. We will  order an MRI to confirm this. She will follow up afterwards to discuss the  results and treatment plan. All questions were answered.    CC: [name], D.O.    ---      Follow Up    ---    after MRI    **Appointment Provider:** Guido Sander, PA    Electronically signed by Yetta Numbers , MD on 01/29/2016 at 01:14 PM EDT    Sign off status: Completed        * * *        [facility]    [address]    [place], [place] [address]    Tel: [phone]    Fax: [phone]              * * *          Patient: [name], [name] DOB: [birth year] Progress Note: Guido Sander,  PA 01/24/2016    ---    Note generated by eClinicalWorks EMR/PM Software ([email or web address])

## 2020-06-29 NOTE — Progress Notes (Signed)
* * *        **  [name]**    --- ---    38 Y old Female, DOB: [birth year]    [address], [place], [place] [address]    Home: [phone]    Provider: [name]        * * *    Telephone Encounter    ---    Answered by   Orson Slick  Date: 01/04/2018         Time: 02:48 PM    Reason   Infusion update    --- ---            Message                      [facility]. Threw up about 75% of into infusion. felt better so restarted at slower rate, then began clammy again and threw up. Probably had about 7.5g left when stopped. after last episode, given benadryl.  She is on a total of 20 grams. She is due nov 1.                 Action Taken                      [name],[name]  01/04/2018 2:49:04 PM > [facility] main line [phone]      [name],[name]  01/10/2018 10:20:54 AM > [name] aware. LVM for pt to check in and see how she is doing                    * * *                ---          * * *          PatientSuan [name] DOB: 26-May-1960 Provider: [name] 01/04/2018    ---    Note generated by eClinicalWorks EMR/PM Software ([email or web address])

## 2020-06-29 NOTE — Progress Notes (Signed)
* * *        **  [name]**    --- ---    36 Y old Female, DOB: 1960-07-07    [address] [place], [place] [address]    Home: [phone]    Provider: [name]        * * *    Telephone Encounter    ---    Answered by   Orson Slick  Date: 09/17/2017         Time: 02:56 PM    Reason   IVIg Approved EXP: 09/30/19    --- ---            Message                      Got shingles vaccine. Ok to look into humirA?      Also do peer to peer [phone] opt 2 - ask for a clinician            Neighborhood health plan HMO commercial active 12/03/11 [id number]      Re-review                 Action Taken                      [name],[name]  09/17/2017 2:57:26 PM >       [name],[name]  09/17/2017 3:28:25 PM > LVM for patient that received message and am working on PA      [name],[name]  09/18/2017 3:30:57 PM > results of subclass IgG still pending      [name],[name]  09/20/2017 1:05:18 PM > submitted chart note and labs for rereview to [phone]. PA# [id number]. Per [facility], clinicians will reach out to [place] with decision. Notified Nicholos Johns  09/25/2017 11:02:26 AM > Spoke with Casimer Bilis. Approved from 10/01/2017 to 09/30/2019 PA number [id number]      [name],[name]  09/25/2017 11:04:03 AM > Discussed with Candise Bowens at [facility] and gave PA info                    * * *                ---          * * *          Patient: [name] DOB: 1960/12/04 Provider: [name] 09/17/2017    ---    Note generated by eClinicalWorks EMR/PM Software ([email or web address])

## 2020-06-29 NOTE — Progress Notes (Signed)
* * *        **  [name]**    --- ---    89 Y old Female, DOB: 02-22-1961    [address], [place], [place] [address]    Home: [phone]    Provider: [name]        * * *    Telephone Encounter    ---    Answered by   Oletha Blend  Date: 09/24/2017         Time: 02:19 PM    Reason   refill    --- ---            Refills  Refill Hydroxychloroquine Sulfate Tablet, 200 mg, Orally, 90, 1  tablet with food or milk, Once a day, 90 days, Refills=1    --- ---          * * *                ---          * * *          PatientDenny [name], [name] DOB: 02/10/1961 Provider: [name] 09/24/2017    ---    Note generated by eClinicalWorks EMR/PM Software ([email or web address])

## 2020-06-29 NOTE — Progress Notes (Signed)
* * *        **  [name]**    --- ---    89 Y old Female, DOB: [birth year]    [address], [place], [place] [address]    Home: [phone]    Provider: [name]        * * *    Telephone Encounter    ---    Answered by   Peter Congo  Date: 03/08/2018         Time: 04:34 PM    Reason   Antibiotics for Dental Procedure    --- ---            Message                      Pt called stating [name] previously recommended propphylatic antibiotic (amoxicillin) prior to dental procedures given her immunosuppression. Her dentist previously wrote the prescription but since its been a few years since she went to so they will not prescribe it anymore. Her PCP also will not prescribe.             Also states her ranitidine 150mg  has been recalled- Spoke to [facility] at [facility] who states they still have brands that are not recalls that can be dispense but do not have refills. Sent refill                Action Taken                      [name],[name] , [name] 03/08/2018 4:36:18 PM > [name] - pt is on Orencia, prednisone 10mg  daily and IVIG and requesting antibiotics prior to dental proceure. How would you like to proceed? Thanks!      [name],[name]  03/11/2018 9:10:10 AM > [place], [place]. appt [facility].      [name],[name]  03/12/2018 10:10:30 AM > called pharmacy and no record from previous dentist since they only keep 18 months. Per UTD, 2 g amoxicillin 30-60 minutes prior to dental procedure. faxed rx                Refills  Refill Ranitidine HCl Capsule, 150 MG, Orally, 90, 1 capsule at  bedtime, Once a day, 90 days, Refills=0    --- ---      Start Amoxicillin Capsule, 500 MG, Orally, 4, 4 pill, 30-60 minutes prior to  dental procedure with food, 1 days, Refills=0          * * *                ---          * * *          PatientALETTE, [name] DOB: 12-24-1960 Provider: [name] 03/08/2018    ---    Note generated by eClinicalWorks EMR/PM Software ([email or web address])

## 2020-06-29 NOTE — Progress Notes (Signed)
* * *        **  [name]**    --- ---    74 Y old Female, DOB: 09/21/1960    [address], [place], [place] [address]    Home: [phone]    Provider: [name]        * * *    Telephone Encounter    ---    Answered by   Donzetta Starch  Date: 11/03/2016         Time: 03:28 PM    Reason   refill    --- ---            Message                      Fyi refill sent.                Action Taken   Gunter-Turner,Kymia 11/03/2016 3:31:28 PM >            Refills  Refill PredniSONE Tablet, 5 mg, Orally, 60, 2 tablet, Once a day, 30    --- ---          * * *                ---          * * *          PatientDenny Peon, [name] DOB: 12-10-1960 Provider: [name] 11/03/2016    ---    Note generated by eClinicalWorks EMR/PM Software ([email or web address])

## 2020-06-29 NOTE — Progress Notes (Signed)
* * *        **  [name]**    --- ---    13 Y old Female, DOB: 02-Mar-1961    [address] [name], [place] [address]    Home: [phone]    Provider: Yetta Numbers        * * *    Telephone Encounter    ---    Answered by   Rufina Falco  Date: 05/02/2016         Time: 01:50 PM    Caller   patient    --- ---            Reason   call back to confirm letter status            Message                      Hi       Patient would like a call back to confirm if her work status letter was sent to her job. Please call patient at [phone]. Thank you                 Action Taken   Muscadin,Rymonnce 05/02/2016 1:52:19 PM > Persico,Claudio  05/03/2016 10:01:28 AM > , Action - Pt telephoned. Spoke to pt.                * * *                ---          * * *          Patient: Alkins, Dejah DOB: 09-01-1960 Provider: Yetta Numbers 05/02/2016    ---    Note generated by eClinicalWorks EMR/PM Software ([email or web address])

## 2020-06-29 NOTE — Progress Notes (Signed)
* * *        [name], Hanae**    --- ---    60 Y old Female, DOB: September 11, 1954, External MRN: [id number]    Account Number: [id number]    [address], [place], [place]    Home: [phone]    Guarantor: [name] Insurance: [place] [place] Payer ID: PAPER    PCP: [name], MD Referring: Fortunato Curling, MD External Visit ID:  [id number]    Appointment Facility: [facility]        * * *    02/17/2016  Progress Notes: Victorio Palm, DPM **CHN#:** [phone]    --- ---    ---        Current Medications    ---    Taking     * Baclofen 10 MG Tablet 1/2 tab Orally prn    ---    * Enbrel SureClick 50 MG/ML Solution Auto-injector inject 50mg  (1 pen) sc Subcutaneous every week, Notes: not yet, still pending    ---    * GLIPIZIDE 5MG  TABLETS 1 tablet daily    ---    * Hydroxychloroquine Sulfate 200 mg Tablet 1 tablet with food or milk Orally Once a day    ---    * Levothyroxine Sodium 50 MCG Tablet 1 tablet Orally Once a day    ---    * METFORMIN TAB 500MG  4 tablets daily    ---    * Omeprazole 40 mg Capsule Delayed Release 1 capsule Orally Once a day    ---    * PredniSONE 5 mg Tablet 2 tablet Orally Once a day    ---    * Tramadol HCl 50 MG Tablet 1 tablet Orally every 6 hrs    ---    * Voltaren 1 % Gel 2 grams Transdermal twice daily, Notes: PRN    ---      Past Medical History    ---       Scleroderma - CREST dx 2007.        ---    IgG4 deficiency s/p IVIG 2010 ----single infusion given preventively after  week of bilateral knee replacements at [facility] 2010.        ---    Fx left wrist in 1994.        ---    Blood clot at LUE in 2006 on a short course of Coumadin.        ---    Bilateral carpal tunnel syndrome s/p Lt carpal tunnel release and steroids  injection right--.        ---    Bone spur left foot.        ---    Scoliosis.        ---    Spinal stenosis s/p steroids injection.        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 Katrinka Blazing 2010--- never infected, but  packed with antibiotics with  surgery;.        ---    Right rotator cuff repairs x 4, complicated by repeat tears, infection,  placement of anchor material.        ---    Elevated liver function tests.        ---    Diabetes.        ---      Surgical History    ---      Right rotator cuff repair,  with infected hardware that had to be removed  2006    ---    Repeat right shoulder surgery, also which became infected. 2007    ---    Left rotator cuff repair 2008    ---    bilateral knee replacements 2009    ---    Left arthroscopic carpal tunnel release 01/2011    ---    ORIF Left 4th metatarsal bone    ---    Left Ulna shortening 1994    ---    Knee replacement 2010    ---    Hand surgery 2013, 2015    ---    remove gallbladder/hernia 1990    ---    Plate left foot 3rd metatarsal 2008    ---      Family History    ---      Mother: deceased 7 yrs, lung cancer, hyperthyroidism, diagnosed with Cancer    ---    Father: deceased 47s yrs, heart attack, diagnosed with Heart Disease    ---    3 brother(s) .    ---    FH of arthritis and scleroderma    Father deceased from MI    Mother deceased age 55 with lung cancer    Brother with scleroderma / copd.    ---      Social History    ---    Tobacco history: Never smoked.    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    Alcohol  Former daily EtOH use in 20s.   Nonsmoker.    Lives with longstanding boyfriend.    ---      Allergies    ---      N.K.D.A.    ---    [name]]      Hospitalization/Major Diagnostic Procedure    ---      as above    ---      Review of Systems    ---     _ORT_ :    Eyes No. Ear, Nose Throat No. Digestion, Stomach, Bowel Yes. Bladder Problems  No. Bleeding Problems No. Numbness/Tingling Yes. Anxiety/Depression No.  Fever/Chills/Fatigue No. Chest Pain/Tightness/Palpitations No. Skin Rash No.  Dental Problems No. Joint/Muscle Pain/Cramps Yes. Blackout/Fainting No. Other  No.            Reason for Appointment    ---      1\. Presents today  for diabetic foot and nail care. relates rupturing her  left achilles last year and not getting surgery. now she just limps. hx of  scleroderma, treated by rheumatology. plate in left foot due to break in 2006    ---      Vital Signs    ---    Pain scale 4, Ht-in 62, Ht-cm 157.48.      Examination    ---     _GENERAL_ :    1+/4 PT and DP pulses with neurological sensation is intact, negative  monofilament. skin is intact with no fissures, abscesses or ulcerations. there  is hammering of lesser toes, left worse than right. nails are onychauxic.          Assessments    ---    1\. Type 2 diabetes mellitus without complication, unspecified long term  insulin use status - E11.9 (Primary)    ---    2\. Hammertoe of left foot - M20.42    ---    3\. Hammertoe of right foot - M20.41    ---  4\. 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 Type 2 diabetes mellitus without complication, unspecified long term  insulin use status**    Notes: discussed with patient proper diabetic foot and nail care. instructed  patient to continue to cut her nails straight accross. discussed with patient  oral medication to treat fungal nails, patient is unable to take because of  elevated LFTs. discussed with patient that a cusotm support would help her  achilles. provided patient with a presctiption for custom orthotics.    ---      Follow Up    ---    1 Year    Electronically signed by Victorio Palm , DPM on 02/22/2016 at 07:20 AM EST    Sign off status: Completed        * * [facility]    [address]    [place], [place] [address]    Tel: [phone]    Fax: [phone]              * * *          Patient: JOLEA, DOLLE DOB: 1954/09/28 Progress Note: Victorio Palm, DPM  02/17/2016    ---    Note generated by eClinicalWorks EMR/PM Software ([email or web address])

## 2020-06-29 NOTE — Progress Notes (Signed)
* * *        **  [name]**    --- ---    20 Y old Female, DOB: [birth year]    [address], [place], [place] [address]    Home: [phone]    Provider: [name]        * * *    Telephone Encounter    ---    Answered by   Alexis Frock  Date: 09/16/2015         Time: 12:41 PM    Caller   Patient    --- ---            Reason   Medication question            Message                      Hello, patient states she ran out of PredniSONE 10 mg Tablet at the end of June, and since then she has been having hand swelling. She would like to know if [name] would like to start her on another medication. Also patient is currently scheduled for September but would like to come in sooner, afternoon preferred. Best contact number [phone]. Thank you.                 Action Taken   Mims,Rateeka 09/16/2015 12:44:28 PM > Sateriale,Rachel 09/16/2015  12:47:09 PM > [name],[name] 09/27/2015 9:50:16 AM > patient called again, i  spoke to Dr.Sanskriti Greenlaw he asked that she be put in sooner.pATIENT SCHEDULED FOR  7/3 AT 8:30                * * *                ---          * * *          Patient: ARMELLA, STOGNER DOB: April 01, 1961 Provider: [name] 09/16/2015    ---    Note generated by eClinicalWorks EMR/PM Software ([email or web address])

## 2020-06-29 NOTE — Progress Notes (Signed)
* * *        **  [name]**    --- ---    40 Y old Female, DOB: July 23, 1960    [address], [place], [place] [address]    Home: [phone]    Provider: [name]        * * *    Telephone Encounter    ---    Answered by   Oswaldo Conroy  Date: 10/02/2017         Time: 09:01 AM    Reason   Refill    --- ---            Action Taken                      Oswaldo Conroy  10/02/2017 9:02:43 AM > Script sent to above pharmacy.                Refills  Refill Omeprazole Capsule Delayed Release, 40 mg, Orally, 90, 1  capsule, Once a day, 90 days, Refills=1    --- ---          * * *                ---          * * *          PatientDenny Peon, Valia DOB: 28-Apr-1960 Provider: [name] 10/02/2017    ---    Note generated by eClinicalWorks EMR/PM Software ([email or web address])

## 2020-06-29 NOTE — Progress Notes (Signed)
* * *        **  [name]**    --- ---    75 Y old Female, DOB: April 21, 1960    [address] [place], [place] [address]    Home: [phone]    Provider: Yetta Numbers        * * *    Telephone Encounter    ---    Answered by   Harley Alto  Date: 02/16/2016         Time: 04:40 PM    Caller   [name]    --- ---            Reason   Call Back            Message                      Hello,             The patient wants to know when her recovery period is to request time off work, please call back at [phone]. Thank you :)                Action Taken   Viaud,Berdine 02/16/2016 4:43:39 PM > Persico,Claudio  02/17/2016 12:26:32 PM > , Action - pt telephoned. Left message. Her preop is  scheduled for 03/02/16 at 9:45am. Needs to go directly to [facility] 5th floor.                * * *                ---          * * *          Patient: Saltos, Marion DOB: [birth year] Provider: Yetta Numbers 02/16/2016    ---    Note generated by eClinicalWorks EMR/PM Software ([email or web address])

## 2020-06-29 NOTE — Progress Notes (Signed)
* * *        [name]**    --- ---    16 Y old Female, DOB: February 03, 1961, External MRN: [id number]    Account Number: [id number]    [address] [place]    Home: [phone]    Guarantor: [name] Insurance: [place] [place]    PCP: [name], MD Referring: Erasmo Leventhal    Appointment Facility[facility]        * * *    12/20/2015   **Appointment Provider:** Alfonse Ras **CHN#:** [id number]    --- ---      **Supervising Provider:** Yetta Numbers, MD    ---        Reason for Appointment    ---      1\. Left index MCPJ pain    ---      History of Present Illness    ---     _GENERAL_ :    60 y/o F w/ scleroderma who has been treated for synovitis/arthritis of his L  index finger MCPJ w serial injections and good results as well as trigger  finger which has responded to steroids in the past. She presents today w a  primary complaint of recurrence of L index finger MCPJ pain as well as pain at  the site of a ganglion cyst excision on the R wrist. The pain is present at  all times, is worse w motion, and better w rest. Her rheumatologist has tried  medications but these have not helped.      Current Medications    ---    Taking     * GLIPIZIDE 5MG  TABLETS 1 tablet daily    ---    * Hydroxychloroquine Sulfate 200 mg Tablet 1 tablet with food or milk Once a day    ---    * Ibuprofen     ---    * Levothyroxine Sodium 50 MCG Tablet 1 tablet Once a day    ---    * METFORMIN TAB 500MG  4 tablets daily    ---    * Omeprazole 40 mg Capsule Delayed Release 1 capsule Once a day    ---    * PredniSONE 5 mg Tablet 2 tablet Once a day    ---    * Voltaren 1 % Gel 2 grams twice daily    ---    Discontinued    * Nystatin 100000 unit/gm Cream 1 application to affected area Twice a day    ---    * PredniSONE 10 mg Tablet 1 tablet Once a day    ---    * Flovent HFA 44 MCG/ACT Aerosol 2 puffs Twice a day    ---      Past Medical History    ---       Scleroderma - CREST dx 2007.        ---    IgG4  deficiency s/p IVIG 2010 ----single infusion given preventively after  week of bilateral knee replacements at [facility] 2010.        ---    Fx left wrist in 1994.        ---    Blood clot at LUE in 2006 on a short course of Coumadin.        ---    Bilateral carpal tunnel syndrome s/p Lt carpal tunnel release and steroids  injection right--.        ---  Bone spur left foot.        ---    Scoliosis.        ---    Spinal stenosis s/p steroids injection.        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 Katrinka Blazing 2010--- never infected, but packed with antibiotics with  surgery;.        ---    Right rotator cuff repairs x 4, complicated by repeat tears, infection,  placement of anchor material.        ---    Elevated liver function tests.        ---      Surgical History    ---      Right rotator cuff repair, with infected hardware that had to be removed  2006    ---    Repeat right shoulder surgery, also which became infected. 2007    ---    Left rotator cuff repair 2008    ---    bilateral knee replacements 2009    ---    Left arthroscopic carpal tunnel release 01/2011    ---    ORIF Left 4th metatarsal bone    ---    Left Ulna shortening 1994    ---      Family History    ---      Mother: deceased 58 yrs, lung cancer, hyperthyroidism, diagnosed with Cancer    ---    Father: deceased 92s yrs, heart attack, diagnosed with Heart Disease    ---    3 brother(s) .    ---    FH of arthritis and scleroderma    Father deceased from MI    Mother deceased age 58 with lung cancer    Brother with scleroderma / copd.    ---      Social History    ---    Tobacco history: Never smoked.    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    Alcohol    _Former daily EtOH use in 20s_   Nonsmoker.    Lives with longstanding boyfriend.    ---      Allergies    ---      N.K.D.A.    ---      Hospitalization/Major Diagnostic Procedure    ---      as above    ---      Review of Systems    ---      _ORT_ :    Eyes No. Ear, Nose Throat No. Digestion, Stomach, Bowel Yes. Bladder Problems  No. Bleeding Problems No. Numbness/Tingling Yes. Anxiety/Depression No.  Fever/Chills/Fatigue No. Chest Pain/Tightness/Palpitations No. Skin Rash No.  Dental Problems No. Joint/Muscle Pain/Cramps Yes. Blackout/Fainting No. Other  No.          Vital Signs    ---    Pain scale 7-8, Ht-in 62, Ht-cm 157.48.      Physical Examination    ---    NAD AAOx3    well nourished well appearing    pleasant affect and demeanor    LUE: pain w passive ROM L index finger MCPJ    able to make composite fist w index 3cm from distal palmar crease    FDP/FDS intact    BCR    SILT R/U all digits    RUE: prior surgical scar well healed    induration over scar w TTP    FROM all digits  5/5 strength throughout    SILT R/M/U    2+ radial.      Assessments    ---    1\. 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 Synovitis of hand - M65.9    ---      Treatment    ---       **1\. Inflammatory arthritis**    Notes: L index finger MCPJ was injected. We will observe the R wrist  induration/pain for now but if this worsens we will obtain an ultraound. She  will f/u prn. At her next f/u, we will obtain repeat XR of bilateral hands and  wrists.    ---      Procedures    ---    Under sterile technique, the left index finger MCPJ was injected today with  0.75 cc of betamethasone and 0.25 cc of 1% lidocaine. A bandaid was applied  and the usual post injection instructions were given (20600).      Procedure Codes    ---      20600 ARTHROCENTESIS W/INJ & ASP-SMALL JOINT    ---      Follow Up    ---    prn w repeat XR bilateral hands/wrists    **Appointment Provider:** Alfonse Ras    Electronically signed by Yetta Numbers , MD on 12/27/2015 at 10:26 AM EDT    Sign off status: Completed        * * *        [facility]    [address]    [place], [place] [address]    Tel: [phone]    Fax: [phone]              * * *           Patient: [name], [name] DOB: 1960/04/13 Progress Note: Alfonse Ras  12/20/2015    ---    Note generated by eClinicalWorks EMR/PM Software ([email or web address])

## 2020-06-29 NOTE — Progress Notes (Signed)
* * *        **  [name]**    --- ---    30 Y old Female, DOB: [birth year]    [address], [place], [place] [address]    Home: [phone]    Provider: [name]        * * *    Telephone Encounter    ---    Answered by   Orson Slick  Date: 09/14/2017         Time: 03:44 PM    Reason   Fax note    --- ---            Dennison Mascot [phone]      Phone: [phone]                Action Taken                      [name],[name]  09/14/2017 3:44:30 PM >       [name],[name]  09/17/2017 9:37:02 AM > faxed note. LVM for [name]                    * * *                ---          * * *          Patient: [name], [name] DOB: [birth year] Provider: [name] 09/14/2017    ---    Note generated by eClinicalWorks EMR/PM Software ([email or web address])

## 2020-06-29 NOTE — Progress Notes (Signed)
* * *        **  [name]**    --- ---    29 Y old Female, DOB: March 08, 1961    [address] [place], [place] [address]    Home: [phone]    Provider: [name]        * * *    Telephone Encounter    ---    Answered by   [name]  Date: 10/02/2017         Time: 08:48 AM    Reason   SGLT2i    --- ---            Message                      Hi [name], all the drugs in the SGLT2i class are coming up as not covered when I check for this patient. Do you know if there is a preferred one? I was going to prescribe my preferred which is jardiance but wanted to find out the out of pocket cost to the patient. Thanks                Action Taken                      Maruice Pieroni  10/02/2017 8:49:53 AM >       Esau,[name]  10/02/2017 2:05:53 PM > When I run a test claim it is $40 copay. There is a voucher available that brings it down to $15 (my system automatically finds one but she could get from [employer]). Claim info does say copay will double if she doesn't use [facility] or mail order.      Desiderio Dolata  10/02/2017 3:21:54 PM > okay thank you, spoke to pt about the above                    * * *                ---          * * *          Patient: [name] DOB: [birth year] Provider: [name] 10/02/2017    ---    Note generated by eClinicalWorks EMR/PM Software ([email or web address])

## 2020-06-29 NOTE — Progress Notes (Signed)
* * *        [name], [name]**    --- ---    69 Y old Female, DOB: Jul 10, 1960, External MRN: [id number]    Account Number: [id number]    [address], [place], [id number]    Home: [phone]    Guarantor: [name] Insurance: [id number] NHP PPO    PCP: [name], MD Referring: [name], MD External Visit ID: [id number]    Appointment Facility: Endocrinology        * * *    01/04/2018   **Appointment Provider:** [name], NP **CHN#:** [id number]    --- ---      **Supervising Provider:** Paulla Dolly, MD    ---         **Current Medications**    ---    Taking     * Baclofen 10 MG Tablet 1/2 tab Orally prn    ---    * Calcium Carbonate-Vitamin D 600-200 MG-UNIT Capsule 1 capsule with a meal Orally Twice a day    ---    * GlipiZIDE XL 5 MG Tablet Extended Release 24 Hour 1 tablet Orally Once a day    ---    * Glucophage XR 500 mg Tablet Extended Release 24 Hour 4 tablets Orally Once a day    ---    * Hydroxychloroquine Sulfate 200 mg Tablet 1 tablet with food or milk Orally Once a day    ---    * Ibuprofen 600 MG Tablet 1 tablet with food or milk as needed Orally Three times a day    ---    * Jardiance 10 MG Tablet 1 tablet Orally Once a day    ---    * Levothyroxine Sodium 25 MCG Tablet 1 tablet Orally Once a day    ---    * Omeprazole 40 mg Capsule Delayed Release 1 capsule Orally Once a day    ---    * PredniSONE 10 MG Tablet 2 tablet Orally Once a day    ---    * Ranitidine HCl 150 mg Capsule 1 capsule at bedtime Orally Once a day    ---    * Rosuvastatin Calcium 10 MG Tablet 1 tablet Orally Once a day    ---    * Sulindac 150 mg Tablet 1 tablet with food Orally Twice a day    ---    * Tramadol HCl 50 MG Tablet 1 tablet Orally every 6 hrs    ---    Not-Taking/PRN    * Humira Pen 40 MG/0.8ML Pen-injector Kit 0.8 ml Subcutaneous every 2 weeks    ---    * Voltaren 1 % Gel 2 grams Transdermal twice daily, Notes: PRN    ---    Unknown    * Oxycodone HCl 5 mg Tablet 1 tablet Orally as needed for pain    ---      Past  Medical History    ---       Scleroderma - CREST dx 2007.        ---    IgG4 deficiency s/p IVIG 2010 ----single infusion given preventively after  week of bilateral knee replacements at [facility] 2010.        ---    Fx left wrist in 1994.        ---    Blood clot at LUE in 2006 on a short course of Coumadin.        ---    Bilateral carpal  tunnel syndrome s/p Lt carpal tunnel release and steroids  injection right--.        ---    Bone spur left foot.        ---    Scoliosis.        ---    Spinal stenosis s/p steroids injection.        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 Katrinka Blazing 2010--- never infected, but packed with antibiotics with  surgery;.        ---    Right rotator cuff repairs x 4, complicated by repeat tears, infection,  placement of anchor material.        ---    Elevated liver function tests.        ---    Diabetes.        ---       **Surgical History**    ---       Right rotator cuff repair, with infected hardware that had to be removed  2006    ---    Repeat right shoulder surgery, also which became infected. 2007    ---    Left rotator cuff repair 2008    ---    bilateral knee replacements 2009    ---    Left arthroscopic carpal tunnel release 01/2011    ---    ORIF Left 4th metatarsal bone    ---    Left Ulna shortening 1994    ---    Knee replacement 2010    ---    Hand surgery 2013, 2015    ---    remove gallbladder/hernia 1990    ---    Plate left foot 3rd metatarsal 2008    ---       **Family History**    ---       Mother: deceased 41 yrs, lung cancer, hyperthyroidism, diagnosed with  Cancer    ---    Father: deceased 73s yrs, heart attack, Heart Disease    ---    3 brother(s) .    ---    FH of arthritis and scleroderma\nFather deceased from MI\nMother deceased age  32 with lung cancer \\nBrother  with scleroderma \/ copd.    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    Alcohol    _Former daily EtOH use  in 20s_    Tobacco history: Never smoked.   Nonsmoker.    Lives with longstanding boyfriend.    ---       **Hospitalization/Major Diagnostic Procedure**    ---       as above    ---      **Review of Systems**    ---     _Diabetes_ :    General: no unintended weight change, no fevers, malaise, weakness. Eyes: no  vision loss. Cardiovascular: no CP, no orthopnea, no palpitations.  Respiratory: no wheezing, no dyspnea. Gastrointestinal: no n/v, no abdominal  pain. Musculoskeletal: no edema. Skin: no rash. Neurological: no numbness.            **Reason for Appointment**    ---       1\. FU RESCHEDULE    ---       **History of Present Illness**    ---     _General_ :    60 y/o female with scleroderma (on prednisone), IgG-4 deficiency, NASH and  uncontrolled T2DM. Seen for initial visit on 03/15/16 for management of  hyperglycemia in diabetes. No Endocrine visit since 04/2016. In the interim has  continued to check blood sugars at home, take metformin and now glipizide is  reduced to 2.5mg  as she reports hypoglycemia episodes with the 5mg  dose.    Diabetes Meds:    glipizide 2.5mg     metformin 2000mg  daily    jardiance 10mg  once daily    -----    reports hypoglycemia 40-60's range on a higher dose of glipizide-5mg , 10mg .    A1c Trend:    01/04/18-----7.7%    09/14/17-----10%    03/02/16-----8.7%    08/05/15-----11%    11/04/14-----7.1%    12/31/13-----7%    05/06/13-----6.6%.    Recent Blood Glucoses:    7 day average= 158    14 day average= 159    30 day average= 164    predinner readings:138, 136, 208, 154, 175, 155, 166, 139, 245, 190, 136, 125,  129, 191, 155.    Diet:    3 meals daily, low CHO    working on reducing fat/cho portions--seen by Nutrition 04/2016.    Exercise:    walks 1-2 mi daily with dog.    Microvascular Complications Screening:    eyes: no acute vision changes, no hx of retinopathy, reports annual exam with  ophthalmologist is scheduled    kidneys: creat:0.91, egfr 71 (09/14/17), **uACR:137 (06/2017)**     feet: denies numbess or parethesias in feet, seen by Dr. Vassie Loll- hammering of  the lesser toes, given rx for custom orthotics.    Macrovascular Complications Screening:    no hx of MI, CVA or PVD    (+) hld    Lipids: TChol: 158, TG:110, HDL:67, LDL:69, Chol/HDL ratio: 2.3% (01/2017)    on rosuvastatin.      **Vital Signs**    ---    Pain scale 5, Ht-in 62, Wt-lbs 177, BMI 32.37, BP 122/84, HR 86, BSA 1.87, O2  99, Ht-cm 157.48, Wt-kg 80.29, Wt Change -3 lb.       **Physical Examination**    ---     _Endo: Diabetes Follow-Up_ :    Eyes: no visible retinopathy.    Heart/CV: heart rate is regular.    Extremities: No LE edema, feet without cuts or ulcers.    Neurological: intact vibration and light touch in feet.    General alert and oriented x3, NAD.    counseled for 20 minutes of this 25 minute visit.       **Assessments**    ---    1\. Type 2 diabetes mellitus with hyperglycemia, without long-term current use  of insulin - E11.65 (Primary)    ---    2\. Other hyperlipidemia - E78.4    ---    3\. Obesity (BMI 30-39.9) - E66.9    ---    4\. Encounter for immunization - Z23    ---      60 y/o female with obesity, hypothyroidism, scleroderma, hld and  uncontrolled T2DM. 3 month f/u of diabetes today. Has been compliant and  tolerating the initiation of jardiance at her last visit. POC A1c is 7.7%  today. Review of limited bg readings demonstrate fair glycemic control with  some variability. I have discussed factors that will impact glucose including  diet/food choices, activity levels. She plans to continue to work on lifestyle  measures. I will continue her current medication doses. She has a history of  hypoglycemia on higher doses of glipizide,  so I would also consider stopping  the 2.5mg  dose of glipizide at the next visit if her blood sugars continue to  improve. She currently denies signs or symptoms of low blood glucose.    On high potency statin, LDL today is 110, slightly above goal of <100.    Annual  influenza vaccine administered today.    ---       **Treatment**    ---       **1\. Type 2 diabetes mellitus with hyperglycemia, without long-term  current use of insulin**    Continue Glucophage XR Tablet Extended Release 24 Hour, 500 mg, 4 tablets,  Orally, Once a day    Continue GlipiZIDE XL Tablet Extended Release 24 Hour, 2.5 mg, 1 tablet,  Orally, Once a day    Continue Jardiance Tablet, 10 MG, 1 tablet, Orally, Once a day    _LAB: Vitamin B12 (B12)_   Vitamin B12  2000  H  211 - 911 - pg/mL    --- --- --- ---    _LAB: Lipid Profile (LIPID)_ Cholesterol  206  H  110 - 199 - mg/dL    --- --- --- ---    High Density Lipoprotein Chol (HDL)  69    35 - 75 - mg/dL    --- --- --- ---    LDL  110    0 - 129 - mg/dL    --- --- --- ---    Chol-Hdl Ratio  3.0     \-    --- --- --- ---    Triglycerides  133    40 - 250 - mg/dL    --- --- --- ---    Hours Fast  2     \- hours    --- --- --- ---    _LAB: Albumin, random urine w/ creat (UALB)_ Albumin Random, Urine  1.4     \-  mg/dL    --- --- --- ---    Microalbumin Calc, Urine  0.03     \- mg/mg    --- --- --- ---    Creatinine Random, Urine  50.04     \- mg/dL    --- --- --- ---    Albumin/Creatinine Ratio, Urine  28    0 - 30 - mg/g    --- --- --- ---         **2\. Other hyperlipidemia**    Continue Rosuvastatin Calcium Tablet, 10 MG, 1 tablet, Orally, Once a day         **3\. Others**    Notes: 1) Please remember to do your repeat blood tests at least one week  prior to your next scheduled follow up visit with [facility]. If blood  tests are not done in advance of the appointment or if an appointment is  canceled/missed, you will be notified of test results via mail. 2) Please  check your medications and notify us as soon as possible if you need refills,  with at least one week''s notice. We cannot guarantee that refill requests can  be processed in a timely fashion sooner than this. , 3) Please remember to  bring your blood sugar meter and/or logs to every office  visit., 4) Please  allow at least 24 hours for any required return phone call. I am in clinic or  available by phone Tuesday-Friday. I am out of the clinic on Mondays. If your  issue or concern is urgent you may ask to speak to the  doctor on call if I am  not available.....,I have reviewed the findings, assessment and treatment plan  with [name], NP and have edited Progress Note as required. Sugars are in  good range. May stop glipizide to avoid hypoglycemic risk. Flu shot today.      **Immunization**    ---       Flu (private) 19 yrs + : 0.5 mL (Route: Intramuscular) given by [name]  , NP on Left Deltoid (Encounter for immunization)    ---       **Procedure Codes**    ---       1610 96045 GLYCOSYLATED HEMOGLOBINS    ---    40981 Flu (private) 19 yrs +    ---    8205 FLUE VIRUS VACCINE ADMIN END - 19147    ---    8523 82956 IIV4 FLU VACC PF 0.5ML IM    ---       **Follow Up**    ---    3 Months, labs today    **Appointment Provider:** [name], NP    Electronically signed by Paulla Dolly , MD on 01/05/2018 at 07:17 AM EDT    Sign off status: Completed        * * *        Endocrinology    [address]    [place], [address]    [place], [place] [address]    Tel: [phone]    Fax: [phone]              * * *          Patient: [name], [name] DOB: 05-23-1960 Progress Note: [name], NP  01/04/2018    ---    Note generated by eClinicalWorks EMR/PM Software ([email or web address])

## 2020-06-29 NOTE — Progress Notes (Signed)
* * *        **  [name]**    --- ---    65 Y old Female, DOB: [birth year]    [address], [place], [place] [address]    Home: [phone]    Provider: [name]        * * *    Telephone Encounter    ---    Answered by   Oletha Blend  Date: 07/19/2016         Time: 03:35 PM    Reason   refill    --- ---            Message                      please review script and send to pharmacy above, thank you                Action Taken   KUMAR,SONAM A, PA-C 07/19/2016 4:16:15 PM >            Refills  Refill Enbrel SureClick Solution Auto-injector, 50 MG/ML,  Subcutaneous, 4, inject 50mg  (1 pen) sc, every week, 28 days, Refills=3    --- ---          * * *                ---          * * *          PatientDenny [name], [name] DOB: 12-Apr-1960 Provider: [name] 07/19/2016    ---    Note generated by eClinicalWorks EMR/PM Software ([email or web address])

## 2020-06-29 NOTE — Progress Notes (Signed)
* * *        [name]**    --- ---    60 Y old Female, DOB: 05-01-1960, External MRN: [id number]    Account Number: [id number]    [address] [place], [place]    Home: [phone]    Guarantor: [name] Insurance: H96 NHP PPO    PCP: [name], MD Referring: [name], MD    Appointment Facility: Rheumatology, Allergy and Immunology        * * *    01/04/2018  Progress Notes: Orson Slick, PA **CHN#:** [phone]    --- ---    ---         **Reason for Appointment**    ---       1\. Rheumatoid arthritis/IVIg deficiency    ---       **History of Present Illness**    ---     _GENERAL_ :    Today [name] presents for follow up for IVIg deficiency and rheumatoid  arthritis. She continues to do well on IVIg without adverse reaction. She is  scheduled for her next infusion today. She states that her hand pain continues  to be present with swelling and morning stiffness for 45 minutes. She notes  aches and pains everywhere, but pain in the hands is the most distracting and  cannot close them. She was previously taking 10mg  daily which has helped, but  has not been taking it for the past 3 weeks because refill was not sent in,  which has resulted in worsening RA symptoms. At her last visit, there was  discussion w/ [name] of adding on Humira vs. Dub Amis. She was previously  on humira from 08/2016 to 10/2016 before discontinuation due to shingles. She  has now recieved the first shingrix vaccine and will get the second one soon.  She does believe she felt some alleviation of joint pain while on humira,  although a short course. She also expresses concern for changes in her left  3rd finger nail with a deep ridge and discoloration over the past week or so.       **Current Medications**    ---    Taking     * Baclofen 10 MG Tablet 1/2 tab Orally prn    ---    * Calcium Carbonate-Vitamin D 600-200 MG-UNIT Capsule 1 capsule with a meal Orally Twice a day    ---    * GlipiZIDE XL 5 MG Tablet Extended Release 24  Hour 1 tablet Orally Once a day    ---    * Glucophage XR 500 mg Tablet Extended Release 24 Hour 4 tablets Orally Once a day    ---    * Hydroxychloroquine Sulfate 200 mg Tablet 1 tablet with food or milk Orally Once a day    ---    * Levothyroxine Sodium 25 MCG Tablet 1 tablet Orally Once a day    ---    * Omeprazole 40 mg Capsule Delayed Release 1 capsule Orally Once a day    ---    * PredniSONE 10 MG Tablet 2 tablet Orally Once a day    ---    * Ranitidine HCl 150 mg Capsule 1 capsule at bedtime Orally Once a day    ---    * Rosuvastatin Calcium 10 MG Tablet 1 tablet Orally Once a day    ---    * Sulindac 150 mg Tablet 1 tablet with food Orally Twice a day    ---    *  Tramadol HCl 50 MG Tablet 1 tablet Orally every 6 hrs    ---    Not-Taking/PRN    * Humira Pen 40 MG/0.8ML Pen-injector Kit 0.8 ml Subcutaneous every 2 weeks    ---    * Voltaren 1 % Gel 2 grams Transdermal twice daily    ---    * Medication List reviewed and reconciled with the patient    ---       **Past Medical History**    ---       Scleroderma - CREST dx 2007.        ---    IgG4 deficiency s/p IVIG 2010 ----single infusion given preventively after  week of bilateral knee replacements at [facility] 2010.        ---    Fx left wrist in 1994.        ---    Blood clot at LUE in 2006 on a short course of Coumadin.        ---    Bilateral carpal tunnel syndrome s/p Lt carpal tunnel release and steroids  injection right--.        ---    Bone spur left foot.        ---    Scoliosis.        ---    Spinal stenosis s/p steroids injection.        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 Katrinka Blazing 2010--- never infected, but packed with antibiotics with  surgery;.        ---    Right rotator cuff repairs x 4, complicated by repeat tears, infection,  placement of anchor material.        ---    Elevated liver function tests.        ---    Diabetes.        ---       **Surgical History**    ---       Right rotator cuff  repair, with infected hardware that had to be removed  2006    ---    Repeat right shoulder surgery, also which became infected. 2007    ---    Left rotator cuff repair 2008    ---    bilateral knee replacements 2009    ---    Left arthroscopic carpal tunnel release 01/2011    ---    ORIF Left 4th metatarsal bone    ---    Left Ulna shortening 1994    ---    Knee replacement 2010    ---    Hand surgery 2013, 2015    ---    remove gallbladder/hernia 1990    ---    Plate left foot 3rd metatarsal 2008    ---       **Family History**    ---       Mother: deceased 58 yrs, lung cancer, hyperthyroidism, diagnosed with  Cancer    ---    Father: deceased 58s yrs, heart attack, Heart Disease    ---    3 brother(s) .    ---    FH of arthritis and scleroderma\nFather deceased from MI\nMother deceased age  58 with lung cancer \\nBrother  with scleroderma \/ copd.    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    Alcohol    _Former daily EtOH  use in 20s_    Tobacco history: Never smoked.   Nonsmoker.    Lives with longstanding boyfriend.    ---       **Allergies**    ---       N.K.D.A.    ---       **Hospitalization/Major Diagnostic Procedure**    ---       as above    ---      **Review of Systems**    ---     _Rheumatology_ :    General No fever, weight loss, swollen glands, fatigue. Muscoskeletal **+  joint pain or swelling, morning stiffness, muscle pain**. Eyes No vision loss,  eye dry, mouth dry, red eye, eye pain. Mouth No dry mouth , mouth sores.  Cardiovascular **+swelling/whitening of hands/feet**. Pulmonary No cough,  cough blood, shortness of breath. Gastrointestinal No Abdominal pain, N/v,  diarrhea, constipation, bloody stools, heartburn. Genitoruinary No difficulty  urinating, bloody urine, pain with urination. Skin No rash, photosensitivity.  Lymphatics No swollen glands, tender glands. Neurological No seizures,  headache, paresthesias, localized weakness. Mental health No  anxiety,  depression, sleep disturbance.          **Vital Signs**    ---    Pain scale 5.5, Ht-in 62, Wt-lbs 175, BMI 32.00, BP 111/74, HR 83, BSA 1.86,  Ht-cm 157.48, Wt-kg 79.38, Wt Change -2 lb.       **Examination**    ---     _Rheumatology_ :    General Appearance  Alert and oriented , No apparent distress .    HENT:  No, alopecia .    Eyes:  No, scleral icterus, scleral erythema .    CV:  regular rate rhythm .    Ext:  trace LE edema .    Skin:  No, rash .    Psych:  No anxiety depression.    Muskuloskeletal Elbows, shoulders, hips, knees have intact ROM with no  synovitis. Hands have synovitis in the PIPs and MCPs bilaterally..    Skin and Nails  Minimal sclerodactyly, deep ridge at base of left 3rd nail  with yellow discoloration.    _RAPID 3_ :    Function    (0-10): _0.7_        Pain    (0-10): _5.5_        Patient Global    (0-10): _1.0_           **Assessments**    ---    1\. 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 Long-term use of high-risk medication - Z79.899    ---    3\. 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 Onychomycosis - B35.1, Discussed difficulty in treating. Begin OTC tx and  monitor.    ---      She continues to have ongoing synovitis however I think the IVIG has  improved her sxs somewhat as well as suppressed her Zoster. After further  discussion, we will begin the process for adding on Orencia for additional  treatment while she continues with IVIg. We will also put her back on  prednisone 10mg  daily for symptom control in the meantime, with hopes of  tapering if Orencia helps. We will check labs in order to initiate treatment  with Orencia, otherwise no additional labs needed today. R/B/A of Orencia  discussed in  detail during today's visit with our pharmacist, Zoe. She will  follow up with [place] in 3 months, or sooner if needed. This plan was discussed in  great detail with Ms. Ramaker, to which she is  agreeable.    ---       **Treatment**    ---       **1\. Seronegative rheumatoid arthritis**    Refill PredniSONE Tablet, 10 MG, 1 tablet, Orally, Once a day, 30 days, 30  Tablet, Refills 3, Notes: Currently taking 10mg /day    _LAB: HCV Hepatitis C Antibody (HCV)_   Hepatitis C Antibody  Non Reactive     Non Reactive -    --- --- --- ---      Notes :Jannely Henthorn 01/04/2018 11:39:01 AM >    --- ---    _LAB: HBV Hepatitis B Surface Antigen (HBSAG)_ Hepatitis B Surface Antigen   Non Reactive    Non Reactive -    --- --- --- ---      Notes :Amil Moseman 01/04/2018 11:39:01 AM >    --- ---    _LAB: HBV Hepatitis B Surface Antibody (HBSAB)_ Hepatitis B Surface Antibody   53.6  H  0.0 - 7.9 - mIU/mL    --- --- --- ---      Notes :Aldea Avis 01/04/2018 11:39:01 AM >    --- ---    Gerald Stabs: Olena Mater Plus (QFER4)_       **Follow Up**    ---    3 Months    Electronically signed by Orson Slick , PA-C on 01/04/2018 at 01:56 PM EDT    Sign off status: Completed        * * *        Rheumatology, Allergy and Immunology    [address]    [place] [address], [address] [place], [place] [address]    Tel: [phone]    Fax: [phone]              * * *          Patient: DESHONDA, CRYDERMAN DOB: [birth year] Progress Note: Orson Slick, PA  01/04/2018    ---    Note generated by eClinicalWorks EMR/PM Software ([email or web address])

## 2020-06-29 NOTE — Progress Notes (Signed)
* * *        **  [name]**    --- ---    26 Y old Female, DOB: 29-Mar-1961    [address], [place], [place] [address]    Home: [phone]    Provider: [name]        * * *    Telephone Encounter    ---    Answered by   Oletha Blend  Date: 03/18/2018         Time: 11:18 AM    Action Taken                      [name],[name]  03/18/2018 11:19:34 AM > rx send        --- ---            Refills  Refill Omeprazole Capsule Delayed Release, 40 mg, Orally, 90, 1  capsule, Once a day, 90 days, Refills=1    --- ---          * * *                ---          * * *          PatientDenny [name], [name] DOB: December 03, 1960 Provider: [name] 03/18/2018    ---    Note generated by eClinicalWorks EMR/PM Software ([email or web address])
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
## 2020-06-29 NOTE — Progress Notes (Signed)
* * *        **  [name]**    --- ---    38 Y old Female, DOB: April 27, 1960    [address] [place], [place] [address]    Home: [phone]    Provider: [name]        * * *    Telephone Encounter    ---    Answered by   Oletha Blend  Date: 12/18/2016         Time: 11:25 AM    Reason   refill    --- ---            Refills  Refill PredniSONE Tablet, 5 mg, Orally, 60, 2 tablet, Once a day, 30,  Refills=2    --- ---          * * *                ---          * * *          PatientDenny [name], [name] DOB: [birth year] Provider: [name] 12/18/2016    ---    Note generated by eClinicalWorks EMR/PM Software ([email or web address])

## 2020-06-29 NOTE — Progress Notes (Signed)
* * *        **  [name]**    --- ---    27 Y old Female, DOB: April 21, 1960    [address] [place], [place] [address]    Home: [phone]    Provider: [name]        * * *    Telephone Encounter    ---    Answered by   Charlies Constable  Date: 01/18/2016         Time: 04:21 PM    Caller   [name]    --- ---            Reason   NO PA NEEDED: ENBREL            Message                      PA is not required for Enbrel. Medication is restricted to [facility]. Please send a script to the pharmacy. Thanks!                Action Taken   Solomon,[name] 01/18/2016 4:22:37 PM > Lee,Jinkyu , [name]  01/19/2016 2:06:15 PM > Thank you. Sent to [facility]. Left a VM w/ pt  stating that it's restricted to [facility].            Refills  Refill Enbrel SureClick Solution Auto-injector, 50 MG/ML,  Subcutaneous, 4, inject 50mg  (1 pen) sc, every week, 28 days, Refills=3    --- ---          * * *                ---          * * *          PatientDenny [name], [name] DOB: [birth year] Provider: [name] 01/18/2016    ---    Note generated by eClinicalWorks EMR/PM Software ([email or web address])

## 2020-06-29 NOTE — Progress Notes (Signed)
* * *        **  [name]**    --- ---    72 Y old Female, DOB: [birth year]    [address] [place] [address]    Home: [phone]    Provider: [name]        * * *    Telephone Encounter    ---    Answered by   Oletha Blend  Date: 07/24/2016         Time: 09:17 AM    Reason   refill    --- ---            Message                      Prednisone 5mg  sent                Refills  Refill PredniSONE Tablet, 5 mg, Orally, 60 Tablet, 2 tablet, Once a  day, 30 days, Refills=2    --- ---          * * *                ---          * * *          PatientSuan [name] DOB: October 12, 1960 Provider: [name] 07/24/2016    ---    Note generated by eClinicalWorks EMR/PM Software ([email or web address])

## 2020-06-29 NOTE — Progress Notes (Signed)
* * *        **  [name]**    --- ---    41 Y old Female, DOB: 30-Oct-1960    [address], [place] [address]    Home: [phone]    Provider: Yetta Numbers        * * *    Telephone Encounter    ---    Answered by   Archer Asa  Date: 03/22/2016         Time: 11:03 AM    Caller   [name]    --- ---            Reason   Call back            Message                      Good morning,      The patient would like a call back regarding work status. The patients job never received the paperwork. Please call the patient back at [phone]            Thank you=)                Action Taken   Allen,Kara 03/22/2016 11:06:48 AM > Persico,Claudio 03/22/2016  12:11:25 PM > , Action - pt telephoned. Left message                * * *                ---          * * *          Patient: [name], [name] DOB: 08/04/1960 Provider: Yetta Numbers 03/22/2016    ---    Note generated by eClinicalWorks EMR/PM Software ([email or web address])

## 2020-06-29 NOTE — Progress Notes (Signed)
* * *        **  [name]**    --- ---    25 Y old Female, DOB: 1961-03-30    [address], [place], [place] [address]    Home: [phone]    Provider: Yetta Numbers        * * *    Telephone Encounter    ---    Answered by   Charyl Dancer  Date: 03/01/2017         Time: 09:56 AM    Caller   [name]    --- ---            Reason   Appointment            Message                      Good morning,      Patient wants a call back from you regarding the letter she received from you to cancel Dr Ansh Fauble's appointment. Please call her back at [phone]                Action Taken   [facility] 03/01/2017 9:58:13 AM > Persico,Claudio  03/01/2017 11:16:22 AM > , Action - Pt telephoned. Spoke to pt.                * * *                ---          * * *          Patient: [name], [name] DOB: [birth year] Provider: Yetta Numbers 03/01/2017    ---    Note generated by eClinicalWorks EMR/PM Software ([email or web address])
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
## 2020-06-29 NOTE — Progress Notes (Signed)
* * *        [name]**    --- ---    60 Y old Female, DOB: Dec 01, 1960, External MRN: [id number]    Account Number: [id number]    [address] [place], [place]    Home: [phone]    Guarantor: [name] Insurance: [place] Payer ID: PAPER    PCP: Laddie Aquas, MD Referring: Fortunato Curling, MD External Visit ID:  [id number]    Appointment Facility: [facility]        * * *    08/11/2015  Progress Notes: Jolaine Artist, MD **CHN#:** [phone]    --- ---    ---        Current Medications    ---    Taking     * Flovent HFA 44 MCG/ACT Aerosol 2 puffs Twice a day    ---    * Ibuprofen     ---    * Levothyroxine Sodium 50 MCG Tablet 1 tablet Once a day    ---    * Nystatin 100000 unit/gm Cream 1 application to affected area Twice a day    ---    * Omeprazole 40 mg Capsule Delayed Release 1 capsule Once a day    ---    * PredniSONE 10 mg Tablet 1 tablet Once a day    ---    * Voltaren 1 % Gel 2 grams twice daily    ---    * Voltaren 1 % Gel 2 grams twice daily    ---    * Medication List reviewed and reconciled with the patient    ---      Past Medical History    ---       Scleroderma - CREST dx 2007.        ---    IgG4 deficiency s/p IVIG 2010 ----single infusion given preventively after  week of bilateral knee replacements at [facility] 2010.        ---    Fx left wrist in 1994.        ---    Blood clot at LUE in 2006 on a short course of Coumadin.        ---    Bilateral carpal tunnel syndrome s/p Lt carpal tunnel release and steroids  injection right--.        ---    Bone spur left foot.        ---    Scoliosis.        ---    Spinal stenosis s/p steroids injection.        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 Katrinka Blazing 2010--- never infected, but packed with antibiotics with  surgery;.        ---    Right rotator cuff repairs x 4, complicated by repeat tears, infection,  placement of anchor material.        ---    Elevated liver function tests.        ---       Surgical History    ---      Right rotator cuff repair, with infected hardware that had to be removed  2006    ---    Repeat right shoulder surgery, also which became infected. 2007    ---    Left rotator cuff repair 2008    ---    bilateral knee replacements 2009    ---  Left arthroscopic carpal tunnel release 01/2011    ---    ORIF Left 4th metatarsal bone    ---    Left Ulna shortening 1994    ---      Family History    ---      Mother: deceased 20 yrs, lung cancer, hyperthyroidism, diagnosed with Cancer    ---    Father: deceased 3s yrs, heart attack, diagnosed with Heart Disease    ---    3 brother(s) .    ---    FH of arthritis and scleroderma    Father deceased from MI    Mother deceased age 5 with lung cancer    Brother with scleroderma / copd.    ---      Social History    ---    Tobacco history: Never smoked.    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    Alcohol    _Former daily EtOH use in 20s_   Nonsmoker.    Lives with longstanding boyfriend.    ---      Allergies    ---      N.K.D.A.    ---    [name]]      Hospitalization/Major Diagnostic Procedure    ---      as above    ---      Review of Systems    ---     _GI/Hepatology_ :    Constitutional No recent weight change, fever, or fatigue. Cardiovascular No  heart disease, chest pain, angina, palpitations, shortness of breath, swelling  of feet, ankles, or hands. Neurological No frequent headaches, lightheaded or  dizziness, seizures/convulsions, tremors, paralysis, stroke, head injury,  numbness. Respiratory No chronic cough/phlegm production, spitting up blood,  shortness or breath, asthma or wheezing. Hematologic/Lymphatic Not slow to  heal after cuts. No bleeding or bruising easily.. Integumentary (Skin, Breast)  No rash or itching.. Genitourinary No frequent urination, burning or painful  urination, blood in urine, incontinence.. Musculoskeletal (+) joint pain,  chronic, unchanged. Denies muscle aches or joint  swelling.            Reason for Appointment    ---      1\. NP/ ELEVATED LFT    ---      History of Present Illness    ---     _GENERAL_ :    [name] is a 60 yo F with a PMHx scleroderma, morbid obesity, IgG-4  deficiency, GERD, DM2, hypothyroidism and inflammatory arthritis who presents  for an initial consultation in Hepatology clinic for abnormal LFTs. She  reports feeling well today, denies any acute illnesses, jaundice, nausea,  emesis, melena, hematochezia, abdominal or lower extremity swelling. Denies  jaundice, confusion or lethargy. She does report diaphoresis. For the past 10  years she has had mild transaminase elevation, most recent labs (08/05/15): AST  47, ALT 118, AP 272 (GGT 1417), TB 0.9. Since 2015, labs notable for: Hgb  13.5, Plt 201, INR 1.0, AMA negative, ASMA negative, HBV sAg non-reactive / e  Ag non-reactive, c Ab non-reactive / DNA not detected, HCV RNA not detected,  Hepatitis A IgM negative. She has never had a liver biopsy or seen a  hepatologist before. She has had persistent transaminase elevation dating back  to 2007. She had an abdominal ultrasound (2014) with a CBD 4-mm, post-CCY and  fatty liver. She has never had ascites or GI bleed.    She denies being told she has had any  liver disease before, though she has  known about her elevated LFTs. No known family history of liver disease or  liver cancer. She was started on prednisone 10 mg PO daily in February for  inflammatory arthritis. Ms. Belkin reports a history of almost daily alcohol  use in her 60s, but has consumed rare EtOH for the past 20 years. Denies any  oral supplement use.      Vital Signs    ---    Pain scale 2, Ht-in 62, Wt-lbs 212.2, BMI 38.81, BP 123/84, HR 90, Temp 97.5,  BSA 2.05, O2 96, Ht-cm 157.48, Wt-kg 96.25, Wt Change -5.8 lb.      Physical Examination    ---     _GENERAL_ :    General Appearance: Well appearing in no apparent distress.    HEENT Anicteric sclera. Moist mucus membranes.    Skin Curved  nails all five fingers on L/R hands bilaterally..    Cardiovascular Regular rate and rhythm, no murmurs, rubs or gallops.    Lungs Comfortable respirations, clear to auscultation bilaterally.    Abdomen Soft, obese, non-tender, non-distended. Normoactive bowel sounds. No  hepatosplenomegaly. No rebound or guarding.    Extremities No clubbing. No lower extremity edema.          Assessments    ---    1\. Abnormal liver function tests - R79.89 (Primary)    ---      Ms. Talyia Allende is a 60 yo F with a PMHx scleroderma, morbid obesity, IgG-4  deficiency, GERD, DM2, hypothyroidism and inflammatory arthritis who presents  for initial Hepatology consultation for asymptomatic transaminase elevation.  She has no acute symptoms or concerns at today's visit. Over the past 10-years  she has had persistent ALT / AST elevation, most recent AST 47 / ALT 118, AP  200s (GGT 1400) with negative ANA / AMA / ASMA and US demonstrating fatty  liver. Viral hepatitis serologies unremarkable. Differential includes NAFLD,  autoimmune hepatitis, AMA-negative PBC, Wilson's disease, alpha-1 antitrypsin.    At today's visit we counseled [name] on the importance of weight loss  through dietary and exercise for her overall liver health, and that if she is  successful this may coincide with an improvement in her LFTs. Ceruloplasmin  and alpha-1 antitrypsin will be checked after today's visit (possible add-on  from last weeks labs). Plan to repeat abdominal US. We will see her back in 3  months to review labs and ultrasound, can consider a liver biopsy at this  time.    ---      Treatment    ---       **1\. Abnormal liver function tests**    _LAB: Alpha 1 Antitrypsin_    _LAB: Ceruloplasmin_    _IMAGING: US Abdomen- Mid Abdomen_     Abnormal LFTs, chronic. Please evaluate  Doppler flow.    --- ---        Notes: 1. Please add on ceruloplasmin and alpha-1 anti-trypsin to labs from  last week. If unable to add on, please have drawn after today's  visit.    2\. Schedule abdominal ultrasound at earliest convenience    3\. Due for repeat colonoscopy within the next 6 months as inadequate  preparation on colonoscopy in 2016 (Dr. Rosalio Macadamia)    4\. Would discuss diaphoresis with PCP. Could consider consultation with  endocrinology for further evaluation.        **2\. Others**    Notes: I, Dr. Jolaine Artist, have personally interviewed  and examined the  patient and reviewed Dr. Alphonsa Overall note. I agree with the history, exam,  assessment and plan as detailed in the note in the management of the patient's  abnormal LFTs and fatty liver on imaging.      Follow Up    ---    3 Months    Electronically signed by Jolaine Artist , MD on 08/13/2015 at 04:57 PM EDT    Sign off status: Completed        * * *        GI Clinic    [address]    [place], [place] [address]    Tel: [phone]    Fax: [phone]              * * *          Patient: KIRSTEIN, BAXLEY DOB: July 05, 1960 Progress Note: Jolaine Artist, MD  08/11/2015    ---    Note generated by eClinicalWorks EMR/PM Software ([email or web address])

## 2020-06-29 NOTE — Progress Notes (Signed)
* * *        [name]**    --- ---    60 Y old Female, DOB: 12-Nov-1960, External MRN: [id number]    Account Number: [id number]    [address] [name], [place]    Home: [phone]    Guarantor: [name] Insurance: [employer] [place] Payer ID: PAPER    PCP: Laddie Aquas, MD Referring: Shellia Carwin External Visit ID: [id number]    [facility]oint[facility]        * * *    08/05/2015   **Appointment Provider:** Ancil Boozer, MD **CHN#:** [phone]    --- ---      **Supervising Provider:** Baron Hamper, MD    ---        Current Medications    ---    Taking     * Flovent HFA 44 MCG/ACT Aerosol 2 puffs Twice a day    ---    * Ibuprofen     ---    * Levothyroxine Sodium 50 MCG Tablet 1 tablet Once a day    ---    * Nystatin 100000 unit/gm Cream 1 application to affected area Twice a day    ---    * Omeprazole 40 mg Capsule Delayed Release 1 capsule Once a day    ---    * PredniSONE 10 mg Tablet 1 tablet Once a day    ---    * Voltaren 1 % Gel 2 grams twice daily    ---    * Voltaren 1 % Gel 2 grams twice daily    ---    * Medication List reviewed and reconciled with the patient    ---      Past Medical History    ---       Scleroderma - CREST dx 2007.        ---    IgG4 deficiency s/p IVIG 2010 ----single infusion given preventively after  week of bilateral knee replacements at [facility] 2010.        ---    Fx left wrist in 1994.        ---    Blood clot at LUE in 2006 on a short course of Coumadin.        ---    Bilateral carpal tunnel syndrome s/p Lt carpal tunnel release and steroids  injection right--.        ---    Bone spur left foot.        ---    Scoliosis.        ---    Spinal stenosis s/p steroids injection.        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 Katrinka Blazing 2010--- never infected, but packed with antibiotics with  surgery;.        ---    Right rotator cuff repairs x 4, complicated by repeat tears, infection,  placement of anchor  material.        ---      Surgical History    ---      Right rotator cuff repair, with infected hardware that had to be removed  2006    ---    Repeat right shoulder surgery, also which became infected. 2007    ---    Left rotator cuff repair 2008    ---    bilateral knee replacements 2009    ---    Left  arthroscopic carpal tunnel release 01/2011    ---    ORIF Left 4th metatarsal bone    ---    Left Ulna shortening 1994    ---      Family History    ---      Mother: deceased 71 yrs, lung cancer, hyperthyroidism, diagnosed with Cancer    ---    Father: deceased 25s yrs, heart attack, diagnosed with Heart Disease    ---    3 brother(s) .    ---    Non-Contributory    ---    FH of arthritis and scleroderma    fa dec with MI    Mo dec age 82 with lung    brother with scleroderma / copd;.    ---      Social History    ---    Tobacco history: Never smoked.    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    Alcohol Yes, but rarely.   Nonsmoker.    Lives with longstanding boyfriend.    ---      Allergies    ---      N.K.D.A.    ---    [name]]      Hospitalization/Major Diagnostic Procedure    ---      as above    ---      Review of Systems    ---     _ORT_ :    Eyes No. Ear, Nose Throat No. Digestion, Stomach, Bowel No. Bladder Problems  No. Bleeding Problems No. Numbness/Tingling No. Anxiety/Depression No.  Fever/Chills/Fatigue No. Chest Pain/Tightness/Palpitations No. Skin Rash No.  Dental Problems No. Joint/Muscle Pain/Cramps Yes. Blackout/Fainting No. Other  No.    Postive joint/muscle pain/cramps. Thirteen system review otherwise negative  and noncontributory. Denies numbness, tingling, or problems with eyes, ears,  nose throat, digestion, stomach, bowel, bladder, bleeding, fever, chills,  fatigue, chest pain/tightness, palpitations, skin rash, dentition or  blackout/fainting.        Reason for Appointment    ---      1\. Followup of a left Achilles rupture, treated nonoperatively DOI: 09/2014    ---       History of Present Illness    ---     _GENERAL_ :    Moselle is doing well 11 months s/p left achilles rupture. She is working with  PT and has noted improved walking endurance and strength. She has been  gardening and is planning on playing frisbee this weekend.      Vital Signs    ---    Pain scale 0, Ht-in 62, Ht-cm 157.48.      Examination    ---     _GENERAL_ :    Well appearing female in NAD. Left ankle ROM 30 deg dorsiflexion to 40 deg  plantarflexion. 4+/5 plantarflexion strength. NVID    Unable to single leg heel raise on left.          Assessments    ---    1\. Rupture Achilles tendon, left, subsequent encounter - S86.012D (Primary)    ---      Treatment    ---       **1\. Rupture Achilles tendon, left, subsequent encounter**    Notes: The patient was seen and examined with Dr. Everardo Beals today. She is doing  very well. The clinical findings were reviewed. She was advised to continue  her PT treatment and strengthening. Follow up PRN.    ---  Follow Up    ---    PRN    **Appointment Provider:** Ancil Boozer, MD    Electronically signed by Baron Hamper , MD on 08/10/2015 at 12:20 PM EDT    Sign off status: Completed        * * *        [facility]    [address]    [place], [place] [address]    Tel: [phone]    Fax: [phone]              * * *          Patient: [name], [name] DOB: [birth year] Progress Note: Ancil Boozer, MD  08/05/2015    ---    Note generated by eClinicalWorks EMR/PM Software ([email or web address])

## 2020-06-29 NOTE — Progress Notes (Signed)
* * *        **  [name]**    --- ---    74 Y old Female, DOB: 08-05-1960    [address] [place], [place] [address]    Home: [phone]    Provider: [name]        * * *    Telephone Encounter    ---    Answered by   Orson Slick  Date: 06/22/2017         Time: 11:02 AM    Reason   Dosing increase?    --- ---            Message                      Also she has new irregular heart beat prior to starting infusion. Nurse reached out to PCP and is waiting to hear back.  [name] from [facility]. [phone].             She is getting 20g every 4 weeks.       Should be getting 35g every 4 weeks based on 400mg /kg because she is 86kg, per [name].             If order changed, requests new order faxed.                Action Taken                      [name],[name]  06/22/2017 11:03:37 AM >       [name],[name] , [name] 06/22/2017 1:17:12 PM > Dosed is based on IBW, not ABW (pt is 62in, IBW ~50kg x400mg /kg = 20g). Discussed this with [name].                     * * *                ---          * * *          Patient: [name], [name] DOB: 1960-11-27 Provider: [name] 06/22/2017    ---    Note generated by eClinicalWorks EMR/PM Software ([email or web address])

## 2020-06-29 NOTE — Progress Notes (Signed)
* * *        **  [name]**    --- ---    57 Y old Female, DOB: 1961/03/09    [address], [place] [address]    Home: [phone]    Provider: [name]        * * *    Telephone Encounter    ---    Answered by   Barbaraann Rondo  Date: 04/18/2016         Time: 08:41 AM    Caller   pt    --- ---            Reason   Injection site reaction            Message                      Hello, patient states she is having an allergic reaction. She would like to know if [name] would like to start her on another medication. Also patient is currently scheduled for Feb but would like to come in sooner, afternoon preferred. Best contact number [phone]. Thank you.                 Action Taken   [facility] 04/18/2016 8:41:59 AM > KUMAR,SONAM A, PA-C  04/18/2016 9:41:51 AM > redness at injection site on bilateral thigh 3 weeks  after starting etanercept. Warm to touch, not itchy, no pus or drainage, not  painful but achy. Likely an injection site reaction. Asked pt to take benedryl  or other allergy medication prior to next injection as well as today and to  apply ice to injection side for 20 minutes before and after injection. Also  rotate injection sites. Next on belly. Will call back if redness or pain  worsen or worsening reaction after next injection.                * * *                ---          * * *          PatientKallan [name], [name] DOB: 07/26/1960 Provider: [name] 04/18/2016    ---    Note generated by eClinicalWorks EMR/PM Software ([email or web address])

## 2020-06-29 NOTE — Progress Notes (Signed)
* * *        **  [name]**    --- ---    72 Y old Female, DOB: Oct 09, 1960    [address], [place], [place] [address]    Home: [phone]    Provider: [name]        * * *    Telephone Encounter    ---    Answered by   Leotis Shames  Date: 11/02/2017         Time: 09:09 AM    Caller   pt    --- ---            Reason   question about infusion            Message                      She has infusion today (got this msg from yest). Wanted to ask about it.                 Action Taken                      [name],[name] , [name] 11/02/2017 10:00:36 AM > The patient called stating she spoke to billing who quoted her infusion $300 each time since it is considered an outpatient procedure. We discussed that unfortunately, due to how the medication works and her indication, we are not able to double her dose and give it less frequently. I did let her know that I notified her home infusion company to keep me updated on the shortage problem and if the medication comes back in stock, we can send her back to home infusion. The patient is okay with this plan. She is coming in for her infusion at 10:30.                     * * *                ---          * * *          PatientSakshi Sermons, Shilo DOB: [birth year] Provider: [name] 11/02/2017    ---    Note generated by eClinicalWorks EMR/PM Software ([email or web address])

## 2020-06-29 NOTE — Progress Notes (Signed)
* * *        **  [name]**    --- ---    89 Y old Female, DOB: Aug 11, 1960    [address], [place], [place] [address]    Home: [phone]    Provider: [name]        * * *    Telephone Encounter    ---    Answered by   Peter Congo  Date: 04/27/2017         Time: 01:49 PM    Reason   IVIG PA Extension    --- ---            Message                      Recieved notice from Debbie at [facility] [phone]) that they required additional lab and clinical info to extend IVIG PA >6. Faxed info to [phone].                Action Taken                      [name],[name] , [name] 04/27/2017 1:50:55 PM >                     * * *                ---          * * *          PatientRENLEY, GUTMAN DOB: 1961/02/17 Provider: [name] 04/27/2017    ---    Note generated by eClinicalWorks EMR/PM Software ([email or web address])

## 2020-06-29 NOTE — Progress Notes (Signed)
* * *        **  [name]**    --- ---    36 Y old Female, DOB: 1961/01/16    [address], [place] [address]    Home: [phone]    Provider: Yetta Numbers        * * *    Telephone Encounter    ---    Answered by   Harley Alto  Date: 02/07/2016         Time: 12:03 PM    Caller   [name]    --- ---            Reason   call back            Message                      Good afternoon,             The patient is calling to schedule surgery, please call back to do so at [phone].                 Action Taken   Viaud,Berdine 02/07/2016 12:05:44 PM > Persico,Claudio 02/08/2016  8:33:41 AM > Called back. Scheduled for 03/07/16                * * *                ---          * * *          Patient: [name], [name] DOB: 1960/04/22 Provider: Yetta Numbers 02/07/2016    ---    Note generated by eClinicalWorks EMR/PM Software ([email or web address])

## 2020-06-29 NOTE — Progress Notes (Signed)
* * *        [name]**    --- ---    55 Y old Female, DOB: [birth year]    [address], [place], [place] [address]    Home: [phone]    Provider: Judy [name]        * * *    Telephone Encounter    ---    Answered by   [name]  Date: 03/01/2017         Time: 01:07 PM    Caller   Patient    --- ---            Reason   IVIg Therapy            Message                      Faxed order form to [facility], notified patient of dosage and infusion process.                 Action Taken   [facility] 03/05/2017 9:45:18 AM > [name]  life care is calling in regards to needing more clincals notes on IVG, and IVG  lab levels. Best ph: [phone] or Fax: [phone]. Thanks. [name],[name] ,  [name] 03/05/2017 10:16:57 AM > Spoke to [place], will have Dr. Lorella [name] update note  to speak mot to IgG deficiency and also get more updated labs.  [name],[name] 03/09/2017 10:11:48 AM > spoke to [place], wanted an update  on lab that she requested and Md notes, will relay msg to [place], [name].  [name],[name] 03/13/2017 1:04:49 PM > [name] is calling in regards to  above message. She is asking to connect with Zoe. Thanks. [name] states she  called 3 times and left several message and this very time sensative. Thanks.  [name],[name] , [name] 03/13/2017 2:09:42 PM > Who did she call? Did not receive  any messages. Sending updated note from Dr. Lorella [name] to [facility], LVM for [facility].  [name], can you order the labs? thanks! [name],[name] 03/13/2017 5:13:20 PM >  ordered! [name],[name] , [name] 03/14/2017 8:46:22 AM > Thanks [name]! [name],  can you reach out to the patient to come get labs drawn? Thanks! [name],[name]  03/14/2017 11:07:36 AM > LVM for pt to give [place] a call back in regards to  when/where she'll like to do labs. [name],[name] 03/14/2017 12:07:56 PM >  Patient called back in regards above msg. She would like to come by the office  on Monday 12/17 around 8:15am to get the paperwork for the labs. Pt  requested  to get a call back to confirm that that time and date works, if she does not  pick up just leave her a vm confirming. She is at work and can't get on the  phone. Thanks. [name],[name] 03/14/2017 2:23:48 PM > Informed pt. Labs ordered for  12/17. Acct number [id number]                * * *              * * *        ---        Reason for Appointment    ---      1\. IVIg Therapy    ---        **Medical History:**   Scleroderma - CREST dx 2007.    ---    IgG4 deficiency s/p IVIG 2010 ----single infusion given preventively  after  week of bilateral knee replacements at [facility] 2010.    ---    Fx left wrist in 1994.    ---    Blood clot at LUE in 2006 on a short course of Coumadin.    ---    Bilateral carpal tunnel syndrome s/p Lt carpal tunnel release and steroids  injection right--.    ---    Bone spur left foot.    ---    Scoliosis.    ---    Spinal stenosis s/p steroids injection.    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 Katrinka Blazing 2010--- never infected, but packed with antibiotics with  surgery;.    ---    Right rotator cuff repairs x 4, complicated by repeat tears, infection,  placement of anchor material.    ---    Elevated liver function tests.    ---    Diabetes.    ---      Assessments    ---    1\. 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 IgG4 deficiency**    _LAB: IgG Subcls_    ---          * * *           PatientMARCIA, [name] DOB: November 02, 1960 Provider: Judy [name]  03/01/2017    ---    Note generated by eClinicalWorks EMR/PM Software ([email or web address])

## 2020-06-29 NOTE — Progress Notes (Signed)
* * *        [name]**    --- ---    48 Y old Female, DOB: 1960-12-28    [address] [place], [place] [address]    Home: [phone]    Provider: [name]        * * *    Telephone Encounter    ---    Answered by   [name]  Date: 10/10/2017         Time: 03:31 PM    Reason   Medical PA approved: Gamunex-C Exp. 10/19/19    --- ---            Message                      [name] from [facility]:  [phone]. States that new order is needed for IVIG.                 Action Taken                      [name],[name]  10/10/2017 3:32:03 PM > Interchangable with hyqvia or hizentra. Would [name] like to change or just hold infusion, gammagard is on national back order.  Call [phone] ext [phone] [name]. emailed Vishwa Dais to see what he would like to do      [name],[name]  10/12/2017 3:11:02 PM > [name] responded and said it is ok to change to different med      [name],[name] , [name] 10/12/2017 3:47:20 PM > Will touch base with Dr. Donneta Romberg about SC formulations.      [name],[name] , [name] 10/18/2017 9:41:35 AM > LVM for [facility] to confirm patient dosage and next infusion date.       [name],[name] , [name] 10/18/2017 2:17:37 PM > Spoke to [place] at [facility] who states gammagard and gamunex are both on back order. Patient gets 400mg /kg (20g) every 4 weeks (last dose 6/14). Discussed switch to SC (Hizentra vs Hyqvia). She will fax me the blank order forms. LVM for patient t see if she wants to come to [facility] for [employer] (lives in [place]).       [name],[name] , [name] 10/18/2017 3:33:57 PM > Spoke to [facility] at [facility]) and confirmed SC dosing for Hizentra and Hyqvia - she is going to fax me the prepopulatd order form.       [name],[name] , [name] 10/19/2017 9:37:45 AM > Spoke to [place] and explained differences in formulations. She is willing to come into the hospital for infusions but does not think her insurance covers in hospital infusions.       [name] - Can you see if her medical insurance will cover Gamunex-C 400mg /kg  (20g) every 4 weeks for treatment of Igg4 Deficiency? Please submit as urgent a the patient is overdue. thanks!       [name],[name]  10/19/2017 11:17:39 PM > PA submitted as urgnet      [name],[name]  10/22/2017 9:46:49 AM > PA approved for Gamunex-C per [employer] [phone], effective from 10/19/17 to 10/19/19. UX#[id number].      [name],[name] , [name] 10/22/2017 10:41:01 AM > Thanks! I will send the order to the IC. Spoke to the patient about switch from Gammagard to [facility] and increased risk of side effects. Patient verbalized understanding. Spoke to [place] at [facility] [phone]) to cancel care with [facility] for now and told them to notify us if the shortage ends. Faxed letter  stating this as well [phone]).                     * * *                ---          * * *          PatientFia [name], [name] DOB: [birth year] Provider: [name] 10/10/2017    ---    Note generated by eClinicalWorks EMR/PM Software ([email or web address])

## 2020-06-29 NOTE — Progress Notes (Signed)
* * *        **  [name]**    --- ---    13 Y old Female, DOB: 01/15/1961    [address] [place], [place] [address]    Home: [phone]    Provider: [name]        * * *    Telephone Encounter    ---    Answered by   Kendal Hymen  Date: 09/26/2016         Time: 09:37 AM    Reason   Shingles DX and Humira    --- ---            Message                      Patient called and stated that she was diagnosed with Shingles yesterday and wonders if she should still take Humira on Sunday.  Instructed patient to hold Humira until she finishes med and is clear of infection                Action Taken   [place] , LPN 05/03/8655 8:46:96 AM > [name] F  10/06/2016 4:50:51 PM > Agreed                * * *                ---          * * *          Patient: [name] DOB: Jan 30, 1961 Provider: [name] 09/26/2016    ---    Note generated by eClinicalWorks EMR/PM Software ([email or web address])

## 2020-06-29 NOTE — Progress Notes (Signed)
* * *        [name]**    --- ---    82 Y old Female, DOB: 06/05/1960, External MRN: [id number]    Account Number: [id number]    [address] [name], [place]    Home: [phone]    Guarantor: [name] Insurance: [place] [place]    PCP: [name], MD Referring: [name]    Appointment Facility: Rheumatology        * * *    03/24/2016  Progress Notes: [name], MD **CHN#:** [phone]    --- ---    ---        Reason for Appointment    ---      1\. Joint pain    ---      History of Present Illness    ---     _GENERAL_ :    She is still experiencing bilateral hand pain in her PIP and MCP joints. The  pain improved with steroids but she ahs been unable to taper. She never got  TNF inhibitor due to some insurance issues. She denies any fevers chills or  recent infections. No other joint pains.      Current Medications    ---    Taking     * Baclofen 10 MG Tablet 1/2 tab Orally prn    ---    * Calcium Carbonate-Vitamin D 600-200 MG-UNIT Capsule 1 capsule with a meal Orally Twice a day    ---    * Enbrel SureClick 50 MG/ML Solution Auto-injector inject 50mg  (1 pen) sc Subcutaneous every week, Notes: not yet, still pending    ---    * GlipiZIDE XL 5 MG Tablet Extended Release 24 Hour 1 tablet Orally Once a day    ---    * Glucophage XR 500 mg Tablet Extended Release 24 Hour 4 tablets Orally Once a day    ---    * Hydroxychloroquine Sulfate 200 mg Tablet 1 tablet with food or milk Orally Once a day    ---    * Levothyroxine Sodium 25 MCG Tablet 1 tablet Orally Once a day    ---    * Omeprazole 40 mg Capsule Delayed Release 1 capsule Orally Once a day    ---    * PredniSONE 5 mg Tablet 2 tablet Orally Once a day    ---    * Rosuvastatin Calcium 10 MG Tablet 1 tablet Orally Once a day    ---    * Tramadol HCl 50 MG Tablet 1 tablet Orally every 6 hrs    ---    * Voltaren 1 % Gel 2 grams Transdermal twice daily, Notes: PRN    ---    * Medication List reviewed and reconciled with the patient     ---      Past Medical History    ---       Scleroderma - CREST dx 2007.        ---    IgG4 deficiency s/p IVIG 2010 ----single infusion given preventively after  week of bilateral knee replacements at [facility] 2010.        ---    Fx left wrist in 1994.        ---    Blood clot at LUE in 2006 on a short course of Coumadin.        ---    Bilateral carpal tunnel syndrome s/p Lt carpal tunnel release and  steroids  injection right--.        ---    Bone spur left foot.        ---    Scoliosis.        ---    Spinal stenosis s/p steroids injection.        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 Katrinka Blazing 2010--- never infected, but packed with antibiotics with  surgery;.        ---    Right rotator cuff repairs x 4, complicated by repeat tears, infection,  placement of anchor material.        ---    Elevated liver function tests.        ---    Diabetes.        ---      Social History    ---    Tobacco history: Never smoked.    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    Alcohol  Former daily EtOH use in 20s.   Nonsmoker.    Lives with longstanding boyfriend.    ---      Allergies    ---      N.K.D.A.    ---      Review of Systems    ---     _ADULT Rheumatology ROS_ :    Constitutional No Recent weight gain, Fatigue, Chills. Eyes No Pain, Loss of  vision, Itching eyes. HENT **+ , Dryness of mouth**. Respiratory No Shortness  of breath, Cough, Hemoptysis. Gastrointestinal + ******, Heartburn**.  Genitourinary No Difficult urination, Pus in urine, Blood in urine.  Musculoskeletal No, Morning stiffness, Arthralgias, Arthralgias. Integumentary  (skin and/or breast) No Easy bruising, Rash, Sun sensitive (sun allergy).  Neurological System No, Headaches, Dizziness, Fainting. Psychiatric No  Excessive worries, Depression, Night sweats. Endocrine No Excessive thirst.  Hematologic/Lymphatic No Swollen glands, Anemia, Lymphadenopathy.  Allergic/Immunologic No Frequent sneezing,  Increased susceptibility to  infection, Raynauds. Heart No Chest Pain, Heart murmur, Irregular heart beat.          Vital Signs    ---    Pain scale 5, Ht-in 62, Wt-lbs 208, BMI 38.04, BP 152/98, HR 85.      Examination    ---     _RAPID 3_ :    Function (0-10): 0.7.        Pain (0-10): 5.0.        Patient Global (0-10): 1.0.          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 Elevated liver enzymes - R74.8    ---    4\. Scleroderma - M34.9    ---      She still has severe synovitis. 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 There is also significant risk of infection due  to her IgG4 deficiency. She has not required IVIG for a long time. We again  discussed the risks and benefits of various therapies and settled on a trial  of enbrel. We will re-apply for PA of this medication. With respect to  steroids, I recommended she stay at her current dose until the enbrel starts  working, 4-6 weeks from now. She is having more GERD despite PPI with  omeprazole, therefore will try protonix.    ---  Treatment    ---       **1\. Seronegative rheumatoid arthritis**    Stop Omeprazole Capsule Delayed Release, 40 mg, 1 capsule, Orally, Once a day    Refill Enbrel SureClick Solution Auto-injector, 50 MG/ML, inject 50mg  (1 pen)  sc, Subcutaneous, every week, 28 days, 4, Refills 3, Notes: not yet, still  pending    Continue Hydroxychloroquine Sulfate Tablet, 200 mg, 1 tablet with food or  milk, Orally, Once a day    Continue Tramadol HCl Tablet, 50 MG, 1 tablet, Orally, every 6 hrs    ---         **2\. Others**    Start Pantoprazole Sodium Tablet Delayed Release, 40 mg, take 1 tablet,  Orally, Once a day, 30 day(s), 30, Refills 3      Follow Up    ---    2 Months    Electronically signed by Erasmo Leventhal , MD on 04/08/2016 at 06:35 PM EST    Sign off status: Completed        * * *        Rheumatology    [address]    [place], [place] [address]    Tel: [phone]    Fax: [phone]              * * *          Patient: [name], SHIFRIN DOB: [birth year] Progress Note: [name], MD  03/24/2016    ---    Note generated by eClinicalWorks EMR/PM Software ([email or web address])

## 2020-06-29 NOTE — Progress Notes (Signed)
* * *        **  [name]**    --- ---    89 Y old Female, DOB: Aug 21, 1960    [address] [name], [place] [address]    Home: [phone]    Provider: Yetta Numbers        * * *    Telephone Encounter    ---    Answered by   Harley Alto  Date: 04/12/2016         Time: 04:41 PM    Caller   [name]    --- ---            Reason   Fax Follow Up form to Ins            Message                      Hello,             The patient called in to see if you received a follow up form from her  insurance fax  on  12/28 th. If you do have the form, the fax number to send to is [phone]. Patient requests a call back if's it is done or if you don't have the forms. Her cell is l [phone]                Action Taken   Viaud,Berdine 04/12/2016 4:46:23 PM > Persico,Claudio 04/13/2016  12:37:17 PM > , Action - Pt telephoned. Spoke to pt. FORMS were faxed on  04/12/16                * * *                ---          * * *          Patient: [name], [name] DOB: [birth year] Provider: Yetta Numbers 04/12/2016    ---    Note generated by eClinicalWorks EMR/PM Software ([email or web address])

## 2020-06-29 NOTE — Progress Notes (Signed)
* * *        [name]**    --- ---    60 Y old Female, DOB: [birth year]    [address], [place], [place] [address]    Home: [phone]    Provider: [name]        * * *    Telephone Encounter    ---    Answered by   Charlies Constable  Date: 01/04/2018         Time: 05:04 PM    Reason   *Orencia Medication Access_ Pharmacy    --- ---            Message                      RX PA denied for Orencia per [facility], Ph [phone], plan type: commercial. Denial letter is scanned under patient docs in Misc.                                                                                                                                                       Action Taken                      Solomon,[name]  01/04/2018 5:06:29 PM >       [name],[name] , [name] 01/07/2018 1:14:50 PM > Per denial letter, [facility] denied because they requires trial and failure of all the preferred agents: Humira, Enbrel, Kyung Rudd. Pt has tried and failed Humia and Enbrel, has an hx of developing shingles in [place] and also has elevated AST/ALT. Will try to appeal.       [name],[name] , [name] 01/07/2018 5:23:49 PM > Appeal faxed      [name],[name] , [name] 01/08/2018 9:53:25 AM > Hi [name], I submitted this appeal yesterday. Can you check in on Friday to see the status and make sure they received it. Thanks!       Zipeto,Anthony V 01/11/2018 3:31:42 PM > Spoke to [place] at appeals and she said nothing has been recieved yet. If you want to re-fax it I'll call on Tuesday to follow up.      [name],[name] , [name] 01/11/2018 4:30:53 PM > Refaxed it! Thanks for checking!      [name],[name] , [name] 01/16/2018 1:44:54 PM > Appeal denied (same reasoning). Will discuss with [name] on Friday.      [name],[name] , [name] 01/18/2018 10:36:27 AM > Discussed with [name], will start a peer to peer - Monday 9-10, Wednesday 12-1, or 3-4:30, Th: 8-10am, Fri after 1:30-4:00.       [name],[name] , [name] 01/21/2018 3:08:28 PM > Hi [name], I can't  find an appeal number to call on the denial letter. Can you see if there is a phone number I can call to schedule?  Thanks!        Zipeto,Anthony V 01/21/2018 3:18:58 PM > Hi Try [phone]      [name],[name] , [name] 01/21/2018 4:16:31 PM > Spoke to [facility]) at [facility] and provided Dr. Blair Heys availability/contact.       [name],[name] , [name] 01/23/2018 3:17:41 PM > Per [name], peer to peer approved "Conducted [facility] review [facility] about Orencia.  I provided most recent LFTs and description of zoster event.  They agreed IL-6 and JAK was inappropriate and agreed to cover Orencia."      [name] - Can you see if the medication is restricted to any pharmacy if they can get [place] an approval letter? Thanks!       Zipeto,Anthony V 01/23/2018 3:36:00 PM > spoke to [facility] PA authorization # [id number] PA effective 01/23/18-01/31/2038. [facility] She was unable to find approval letter to fax over.      [name],[name] , [name] 01/23/2018 4:11:40 PM > Thanks! I will [facility] the [facility]. LVM for the patient.      [name],[name] , [name] 01/24/2018 9:50:37 AM > Patient notified. We reviewed storage, disposal and dosing frequency again. patient verbalized understanding and states she feels comfortable doing the first injection on her own. Will check in on her in a month for follow-up.                 Refills  Start Orencia ClickJect Solution Auto-injector, 125 MG/ML,  Subcutaneous, 4, 1 ml, once weekly, 28 days, Refills=5    --- ---          * * *                ---          * * *          PatientDenny [name], [name] DOB: [birth year] Provider: [name] 01/04/2018    ---    Note generated by eClinicalWorks EMR/PM Software ([email or web address])

## 2020-06-29 NOTE — Progress Notes (Signed)
* * *        **  [name]**    --- ---    [name], DOB: 1960-06-23    [address] [place], [place] [address]    Home: [phone]    Provider: Yetta Numbers        * * *    Telephone Encounter    ---    Answered by   Rufina Falco  Date: 05/09/2016         Time: 12:23 PM    Caller   patient    --- ---            Reason   re fax letter            Message                      Hello,            Patient wanted to have her clearance letter re-faxed to [phone] att. Richard. They stated they did not receive it the first time.                Action Taken   Muscadin,Rymonnce 05/09/2016 12:27:35 PM > Persico,Claudio  05/09/2016 12:57:38 PM > Refaxed.                * * *                ---          * * *          Patient: [name], [name] DOB: Apr 01, 1961 Provider: Yetta Numbers 05/09/2016    ---    Note generated by eClinicalWorks EMR/PM Software ([email or web address])

## 2020-06-29 NOTE — Progress Notes (Signed)
* * *        **  [name]**    --- ---    70 Y old Female, DOB: February 02, 1961    [address], [place], [place] [address]    Home: [phone]    Provider: [name]        * * *    Telephone Encounter    ---    Answered by   Peter Congo  Date: 11/02/2017         Time: 12:09 PM    Reason   IVIG (gamunex-c) infusion    --- ---            Message                      I saw the patient in the infusion center today. This is her first day receiving the Gamunex-C at [facility] (has been on Gammagard through home infusion). The patient is tolerating the medication well so far. We spoke about increased risk of side effects dosing the patient verblized understaning. I answered all the patients questions to her satisfaction. We will see her back in 4 more weeks.                Action Taken                      [name],[name] , [name] 11/02/2017 12:11:03 PM >                     * * *                ---          * * *          PatientDELENA, [name] DOB: 05/18/1960 Provider: [name] 11/02/2017    ---    Note generated by eClinicalWorks EMR/PM Software ([email or web address])

## 2020-06-29 NOTE — Progress Notes (Signed)
* * *        [name]**    --- ---    60 Y old Female, DOB: 1960/04/20, External MRN: [id number]    Account Number: [id number]    [address] [place], [place]    Home: [phone]    Guarantor: [name] Insurance: [employer]P [place]    PCP: [name], MD Referring: Fortunato Curling, MD    Appointment Facility: [facility]        * * *    03/19/2017  Progress Notes: Yetta Numbers, MD **CHN#:** [phone]    --- ---    ---         **Reason for Appointment**    ---       1\. s/p left reverse TSA, DOS 03/07/16    ---       **History of Present Illness**    ---     _GENERAL_ :    [name] is a 60 y/o F who presents for follow up for above procedure as well  as concerns regarding her left hand and right foot. She states that her  shoulder is doing very well today and she is very happy with the outcome of  her surgery. She notes some continued limitation with her internal rotation,  but overall is not limited in her daily activities. Today she endorses  generalized joint pain associated with her arthritis. She currently sees  rheumatology for this, but notes that she had to stop her Humira due to  develping shingles. She states that her rheumatologist would like to start her  on IgG infusions for three months before beginning Humira again. Because of  this she is experiencing increased pain in her IF MCP joint. She notes pain  with all motions of her finger and is unable to make a full fist. She has had  a cortisone injection of this joint in the past and would like to repeat the  injection. She also notes pain in her right 2nd toe today and is developing an  ulcer at the PIP joint. She has seen Dr. Vassie Loll for this, but would like a  second opinion regarding her options. Currently wears a splint to hold her toe  down and avoid rubbing in shoes.       **Current Medications**    ---    Taking     * Baclofen 10 MG Tablet 1/2 tab Orally prn    ---    * Calcium Carbonate-Vitamin D 600-200 MG-UNIT  Capsule 1 capsule with a meal Orally Twice a day    ---    * GlipiZIDE XL 5 MG Tablet Extended Release 24 Hour 1 tablet Orally Once a day    ---    * Glucophage XR 500 mg Tablet Extended Release 24 Hour 4 tablets Orally Once a day    ---    * Hydroxychloroquine Sulfate 200 mg Tablet 1 tablet with food or milk Orally Once a day    ---    * Levothyroxine Sodium 25 MCG Tablet 1 tablet Orally Once a day    ---    * Omeprazole 40 mg Capsule Delayed Release 1 capsule Orally Once a day    ---    * PredniSONE 10 MG Tablet 2 tablet Orally Once a day    ---    * Ranitidine HCl 150 mg Capsule 1 capsule at bedtime Orally Once a day    ---    *  Rosuvastatin Calcium 10 MG Tablet 1 tablet Orally Once a day    ---    * Sulindac 150 mg Tablet 1 tablet with food Orally Twice a day    ---    * Tramadol HCl 50 MG Tablet 1 tablet Orally every 6 hrs    ---    Not-Taking/PRN    * Humira Pen 40 MG/0.8ML Pen-injector Kit 0.8 ml Subcutaneous every 2 weeks    ---    * Voltaren 1 % Gel 2 grams Transdermal twice daily, Notes: PRN    ---    * Medication List reviewed and reconciled with the patient    ---       **Past Medical History**    ---       Scleroderma - CREST dx 2007.        ---    IgG4 deficiency s/p IVIG 2010 ----single infusion given preventively after  week of bilateral knee replacements at [facility] 2010.        ---    Fx left wrist in 1994.        ---    Blood clot at LUE in 2006 on a short course of Coumadin.        ---    Bilateral carpal tunnel syndrome s/p Lt carpal tunnel release and steroids  injection right--.        ---    Bone spur left foot.        ---    Scoliosis.        ---    Spinal stenosis s/p steroids injection.        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 Katrinka Blazing 2010--- never infected, but packed with antibiotics with  surgery;.        ---    Right rotator cuff repairs x 4, complicated by repeat tears, infection,  placement of anchor material.        ---     Elevated liver function tests.        ---    Diabetes.        ---       **Surgical History**    ---       Right rotator cuff repair, with infected hardware that had to be removed  2006    ---    Repeat right shoulder surgery, also which became infected. 2007    ---    Left rotator cuff repair 2008    ---    bilateral knee replacements 2009    ---    Left arthroscopic carpal tunnel release 01/2011    ---    ORIF Left 4th metatarsal bone    ---    Left Ulna shortening 1994    ---    Knee replacement 2010    ---    Hand surgery 2013, 2015    ---    remove gallbladder/hernia 1990    ---    Plate left foot 3rd metatarsal 2008    ---       **Family History**    ---       Mother: deceased 47 yrs, lung cancer, hyperthyroidism, diagnosed with  Cancer    ---    Father: deceased 14s yrs, heart attack, Heart Disease    ---    3 brother(s) .    ---    FH of arthritis and scleroderma    Father deceased from MI  Mother deceased age 74 with lung cancer    Brother with scleroderma / copd.    ---       **Social History**    ---    Tobacco history: Never smoked.    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    Alcohol  Former daily EtOH use in 20s.   Nonsmoker.    Lives with longstanding boyfriend.    ---       **Allergies**    ---       N.K.D.A.    ---       **Hospitalization/Major Diagnostic Procedure**    ---       as above    ---      **Review of Systems**    ---     _ORT_ :    Eyes No. Ear, Nose Throat No. Digestion, Stomach, Bowel No. Bladder Problems  No. Bleeding Problems No. Numbness/Tingling No. Anxiety/Depression No.  Fever/Chills/Fatigue No. Chest Pain/Tightness/Palpitations No. Skin Rash No.  Dental Problems No. Joint/Muscle Pain/Cramps Yes. Blackout/Fainting No. Other  No.          **Vital Signs**    ---    Pain scale 7, Ht-in 62, Ht-cm 157.48.       **Physical Examination**    ---    On examination [name] is a pleasant, well-appearing female in no apparent  distress. Examination of the left shoulder  reveals incision is well healed.  There is no erythema, edema, or bony deformity. There is no tenderness of the  shoulder. ROM: forward flexion 0-170, ER 0-30 degrees actively, 0-60 degrees  passively, IR 0-30 degrees. Rotator cuff takes good resistance. Examination of  the left hand reveals skin is clean and intact. There is tenderness of the IF  MCP joint. Pain with motion of the finger. Unable to make composite fist.  Hands are warm and well perfused with palpable radial pulses and neurologic  sensation intact to light touch distally.    Examination of the right foot reveals ulceration at the dorsal surface of the  2nd toe PIP joint. There is tenderness of the PIP joint. Distal strength and  ROM is normal. Fires EHL/FHL/GS/TA. Neurologic sensation intact to light touch  in s/s/dpn/spn/t distributions. Lower extremities are warm and well perfused  with palpable DP and PT pulses.       **Assessments**    ---    1\. 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 Finger pain, left - M79.645    ---    3\. 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 Rotator cuff arthropathy of left shoulder**    Notes:  Patient was seen and examined with Dr. [name]. [name] is a 60 y/o  F who is now one year post left TKA. She is doing very well today and we are  pleased with her progress. She can continue with all activities as tolerated  regarding her shoulder with no restrictions. In regards to her arthritis  related pain we encouraged her to follow up with her rheumatologist to  discussion management of her medications. We offered her a cortisone injection  of her left IF MCP joint which she elected to try. Injection was performed in  office and patient tolerated procedure well. In regards to her toe pain we  discussed that outside of the  splinting that she is currently doing, surgery  to fuse the PIP joint is the best option. She will follow up with Dr. Vassie Loll  to  discuss this further. We will plan to see her back in 2 months to  reevaluate with xrays of the left shoulder and hand. Patient voiced agreement  with treatment plan and all questions were answered.        Written by Judie Grieve Case, ATC. [name]    ---      **Procedures**    ---    Under sterile technique, the MCP joint of the left IF was injected today with  0.75 cc of betamethasone and 0.25 cc of 1% lidocaine. A bandaid was applied to  the injection site and the pt was given the standard post injection  instructions. (20600).       **Procedure Codes**    ---       20600 Arthrocentesis - Small    ---    W0981 [place]    ---       **Follow Up**    ---    2 Months    Electronically signed by Yetta Numbers , MD on 04/28/2017 at 10:15 PM EST    Sign off status: Completed        * * *        [facility]    [address]    [place], [address]    [place], [place] [address]    Tel: [phone]    Fax: [phone]              * * *          Patient: [name], [name] DOB: [birth year] Progress Note: Yetta Numbers, MD  03/19/2017    ---    Note generated by eClinicalWorks EMR/PM Software ([email or web address])

## 2020-06-29 NOTE — Progress Notes (Signed)
* * *        [name], [name]**    --- ---    8 Y old Female, DOB: 08/03/1960, External MRN: [id number]    Account Number: [id number]    [address], [id number]    Home: [phone]    Guarantor: [name] Insurance: [place] [place] Payer ID: PAPER    PCP: [name], MD Referring: Fortunato Curling, MD External Visit ID:  [id number]    Appointment Facility: Endocrinology        * * *    04/12/2016   **Appointment Provider:** [name], NP **CHN#:** [id number]    --- ---      **Supervising Provider:** Paulla Dolly, MD    ---        Current Medications    ---    Taking     * Baclofen 10 MG Tablet 1/2 tab Orally prn    ---    * Calcium Carbonate-Vitamin D 600-200 MG-UNIT Capsule 1 capsule with a meal Orally Twice a day    ---    * Enbrel SureClick 50 MG/ML Solution Auto-injector inject 50mg  (1 pen) sc Subcutaneous every week, Notes: not yet, still pending    ---    * GlipiZIDE XL 5 MG Tablet Extended Release 24 Hour 1 tablet Orally Once a day    ---    * Glucophage XR 500 mg Tablet Extended Release 24 Hour 4 tablets Orally Once a day    ---    * Hydroxychloroquine Sulfate 200 mg Tablet 1 tablet with food or milk Orally Once a day    ---    * Levothyroxine Sodium 25 MCG Tablet 1 tablet Orally Once a day    ---    * Pantoprazole Sodium 40 mg Tablet Delayed Release take 1 tablet Orally Once a day    ---    * PredniSONE 5 mg Tablet 2 tablet Orally Once a day    ---    * Rosuvastatin Calcium 10 MG Tablet 1 tablet Orally Once a day    ---    * Tramadol HCl 50 MG Tablet 1 tablet Orally every 6 hrs    ---    * Voltaren 1 % Gel 2 grams Transdermal twice daily, Notes: PRN    ---    * Medication List reviewed and reconciled with the patient    ---      Past Medical History    ---       Scleroderma - CREST dx 2007.        ---    IgG4 deficiency s/p IVIG 2010 ----single infusion given preventively after  week of bilateral knee replacements at [facility] 2010.        ---    Fx left wrist in 1994.        ---    Blood clot at  LUE in 2006 on a short course of Coumadin.        ---    Bilateral carpal tunnel syndrome s/p Lt carpal tunnel release and steroids  injection right--.        ---    Bone spur left foot.        ---    Scoliosis.        ---    Spinal stenosis s/p steroids injection.        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 Katrinka Blazing 2010--- never infected, but  packed with antibiotics with  surgery;.        ---    Right rotator cuff repairs x 4, complicated by repeat tears, infection,  placement of anchor material.        ---    Elevated liver function tests.        ---    Diabetes.        ---      Surgical History    ---      Right rotator cuff repair, with infected hardware that had to be removed  2006    ---    Repeat right shoulder surgery, also which became infected. 2007    ---    Left rotator cuff repair 2008    ---    bilateral knee replacements 2009    ---    Left arthroscopic carpal tunnel release 01/2011    ---    ORIF Left 4th metatarsal bone    ---    Left Ulna shortening 1994    ---    Knee replacement 2010    ---    Hand surgery 2013, 2015    ---    remove gallbladder/hernia 1990    ---    Plate left foot 3rd metatarsal 2008    ---      Family History    ---      Mother: deceased 56 yrs, lung cancer, hyperthyroidism, diagnosed with Cancer    ---    Father: deceased 64s yrs, heart attack, diagnosed with Heart Disease    ---    3 brother(s) .    ---    FH of arthritis and scleroderma    Father deceased from MI    Mother deceased age 36 with lung cancer    Brother with scleroderma / copd.    ---      Social History    ---    Tobacco history: Never smoked.    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    Alcohol    _Former daily EtOH use in 20s_   Nonsmoker.    Lives with longstanding boyfriend.    ---      Allergies    ---      N.K.D.A.    ---    [name]]      Hospitalization/Major Diagnostic Procedure    ---      as above    ---      Review of  Systems    ---     _Diabetes_ :    General: no unintended weight change, no fevers, malaise, weakness. Eyes: no  vision loss. Cardiovascular: no CP, no orthopnea, no palpitations.  Respiratory: no wheezing, no dyspnea. Gastrointestinal: no n/v, no abdominal  pain. Musculoskeletal: no edema. Skin: no rash. Neurological: no numbness.            Reason for Appointment    ---      1\. IN-PT FU/DIABETES    ---      History of Present Illness    ---     _General_ :    60 y/o female with scleroderma (on prednisone), IgG-4 deficiency, NASH and  uncontrolled T2DM. Seen for initial visit on 03/15/16 for management of  hyperglycemia in diabetes. Since her last visit with me she has seen  nutritionist Consuella Lose and is working on her diet. She is also checking  blood sugars routinely and documenting these with an accompanying food log.  Compliant to her current  glipizide and metformin doses.    Diabetes Meds:    glipizide 5mg     metformin 2000mg  daily    no previous medications or insulin injections    -----    reports hypoglycemia 40-60's range on a higher dose of glipizide-10mg .    A1c Trend:    8.7%-----03/02/16    11%-----08/05/15    7.1%-----11/04/14    7%-----12/31/13    6.6%-----05/06/13.    Recent Blood Glucoses:    checking some blood sugars now at various times of the day    meter download via glooko (04/12/16)    30 days/38 readings/1.3 per day    range: 96-406    average: 266    24% in range, 76% above range.    Diet:    appetite is good    working on reducing fat/cho portions--seen by Nutrition 04/2016.    Exercise:    walks 1-2 mi daily with dog.    Microvascular Complications Screening:    eyes: no acute vision changes, no hx of retinopathy, reports annual exam with  ophthalmologist is scheduled    kidneys: creat:0.89, egfr:74, urine albumin:<0.5    feet: denies numbess or parethesias in feet, seen by Dr. Vassie Loll- hammering of  the lesser toes, given rx for custom orthotics.    Macrovascular Complications  Screening:    no hx of MI, CVA or PVD    (+) hld    Lipids: TC:286, TG:179, HDL:65, LDL:185 (08/05/15)--started on 10mg  rosuvastatin  after this.      Vital Signs    ---    Pain scale 0, Ht-in 62, Wt-lbs 207, BMI 37.86, BP 135/89, HR 98, BSA 2.02, Ht-  cm 157.48, Wt-kg 93.89, Oxygen sat % 98.      Physical Examination    ---     _Diabetes_ :    Gen: pleasant,alert and oriented x3, no distress.    Neck: no lymphadenopathy.    Cardio: S1S2, heart rate is regular.    Resp: clear to auscultation bilaterally.    Musl: no lower extremity edema bilaterally.    >50% of this 20 minute visit spent counseling the patient.      Assessments    ---    1\. Other hyperlipidemia - E78.4 (Primary)    ---    2\. Type 2 diabetes mellitus with hyperglycemia, without long-term current use  of insulin - E11.65    ---    3\. Obesity (BMI 30-39.9) - E66.9    ---      60 y/o female with T2DM, recently with worsening hyperglycemia. Prior  hemoglobin a1c on 03/02/16 was 8.7%. Today Ms. Zaucha returns for review of her  blood glucose readings. She is starting to track her blood sugars and log her  food intake. Pt has also been seen by Nutrition which she found very helpful.  By review of her blood glucose readings, fasting sugars have been improving  over the past 1 week with reduced portions and changes in food portions at  dinner time. She is still experiencing some postprandial/daytime high sugars.  Pt reports motivation to continue to work on diet and increase her physical  activity. I will continue her maximum dose of metformin and also the current  dose of glipizde given her documented hypoglycemia on 10mg  daily. She will see  me again in 6-8 weeks to track her progress. I did discuss with her potential  add on therapy today and these options. She does not want to consider  injectable therapy,  though says she would consider if she is unsuccessful at  improving blood glucose on her own over the next few months. SGLT2 may be a  reasonable  option given that this is oral medication, minimal risk of  hypoglycemia and potential for modest weight loss. Will consider this if  sugars remain high. Will need to consider insulin if sugars trend higher. She  is aware.    ---      Treatment    ---       **1\. Type 2 diabetes mellitus with hyperglycemia, without long-term  current use of insulin**    Continue GlipiZIDE XL Tablet Extended Release 24 Hour, 5 MG, 1 tablet, Orally,  Once a day    Continue Glucophage XR Tablet Extended Release 24 Hour, 500 mg, 4 tablets,  Orally, Once a day    ---         **2\. Others**    Notes: I have reviewed the findings, assessment and treatment plan with [name], NP and have edited Progress Note as required. Working on diet to bring  sugars down. No change in meds for now.      Follow Up    ---    6-8 weeks    **Appointment Provider:** [name], NP    Electronically signed by Paulla Dolly , MD on 04/13/2016 at 12:56 PM EST    Sign off status: Completed        * * *        Endocrinology    [address]    [place], [place] [address]    Tel: [phone]    Fax: [phone]              * * *          Patient: [name], [name] DOB: 11-Apr-1960 Progress Note: [name], NP  04/12/2016    ---    Note generated by eClinicalWorks EMR/PM Software ([email or web address])

## 2020-06-29 NOTE — Progress Notes (Signed)
* * *        [name]**    --- ---    63 Y old Female, DOB: 1961-01-08, External MRN: [id number]    Account Number: [id number]    [address] [id number]    Home: [phone]    Guarantor: [name] Insurance: [employer] [place]    PCP: [name], MD Referring: Fortunato Curling, MD    Appointment Facility: Rheumatology        * * *    05/05/2016  Progress Notes: [name], MD **CHN#:** [phone]    --- ---    ---        Reason for Appointment    ---      1\. Follow up scleroderma    ---      History of Present Illness    ---     _GENERAL_ :    [name] presents for a follow up of her scleroderma. She is doing about the  same as before. She has been taking Enbrel for 5 weeks with not much relief;  the first two week of taking medication she had no reaction but the third week  she developed a red blotch on her upper thigh. She continues to have joint  pain in her hands exacerbated by cold weather, accompanied by mild GI  problems, she bough zantac over the counter that provides relief. She also has  minor cut on left hand that she has been soaking in hydrogen peroxide.    Denies fevers, chills, no new infections.      Current Medications    ---    Taking     * Baclofen 10 MG Tablet 1/2 tab Orally prn    ---    * Calcium Carbonate-Vitamin D 600-200 MG-UNIT Capsule 1 capsule with a meal Orally Twice a day    ---    * Enbrel SureClick 50 MG/ML Solution Auto-injector inject 50mg  (1 pen) sc Subcutaneous every week, Notes: not yet, still pending    ---    * GlipiZIDE XL 5 MG Tablet Extended Release 24 Hour 1 tablet Orally Once a day    ---    * Glucophage XR 500 mg Tablet Extended Release 24 Hour 4 tablets Orally Once a day    ---    * Hydroxychloroquine Sulfate 200 mg Tablet 1 tablet with food or milk Orally Once a day    ---    * Levothyroxine Sodium 25 MCG Tablet 1 tablet Orally Once a day    ---    * Pantoprazole Sodium 40 mg Tablet Delayed Release take 1 tablet Orally Once a day    ---    *  PredniSONE 5 mg Tablet 2 tablet Orally Once a day    ---    * Rosuvastatin Calcium 10 MG Tablet 1 tablet Orally Once a day    ---    * Tramadol HCl 50 MG Tablet 1 tablet Orally every 6 hrs    ---    Not-Taking/PRN    * Voltaren 1 % Gel 2 grams Transdermal twice daily, Notes: PRN    ---    * Medication List reviewed and reconciled with the patient    ---      Past Medical History    ---       Scleroderma - CREST dx 2007.        ---    IgG4 deficiency s/p IVIG 2010 ----single infusion given preventively  after  week of bilateral knee replacements at [facility] 2010.        ---    Fx left wrist in 1994.        ---    Blood clot at LUE in 2006 on a short course of Coumadin.        ---    Bilateral carpal tunnel syndrome s/p Lt carpal tunnel release and steroids  injection right--.        ---    Bone spur left foot.        ---    Scoliosis.        ---    Spinal stenosis s/p steroids injection.        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 Katrinka Blazing 2010--- never infected, but packed with antibiotics with  surgery;.        ---    Right rotator cuff repairs x 4, complicated by repeat tears, infection,  placement of anchor material.        ---    Elevated liver function tests.        ---    Diabetes.        ---      Surgical History    ---      Right rotator cuff repair, with infected hardware that had to be removed  2006    ---    Repeat right shoulder surgery, also which became infected. 2007    ---    Left rotator cuff repair 2008    ---    bilateral knee replacements 2009    ---    Left arthroscopic carpal tunnel release 01/2011    ---    ORIF Left 4th metatarsal bone    ---    Left Ulna shortening 1994    ---    Knee replacement 2010    ---    Hand surgery 2013, 2015    ---    remove gallbladder/hernia 1990    ---    Plate left foot 3rd metatarsal 2008    ---      Family History    ---      Mother: deceased 52 yrs, lung cancer, hyperthyroidism, diagnosed with Cancer    ---     Father: deceased 29s yrs, heart attack, diagnosed with Heart Disease    ---    3 brother(s) .    ---    Non-Contributory    ---    FH of arthritis and scleroderma    Father deceased from MI    Mother deceased age 48 with lung cancer    Brother with scleroderma / copd.    ---      Social History    ---    Tobacco history: Never smoked.    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    Alcohol  Former daily EtOH use in 20s.   Nonsmoker.    Lives with longstanding boyfriend.    ---      Allergies    ---      N.K.D.A.    ---      Hospitalization/Major Diagnostic Procedure    ---      as above    ---      Review of Systems    ---     _ADULT Rheumatology ROS_ :    Constitutional No Recent weight gain, Fatigue, Chills. Eyes No Pain, Loss of  vision,  Itching eyes. HENT No Headache Ringing in ears Loss of hearing.  Respiratory No Shortness of breath, Cough, Hemoptysis. Gastrointestinal No  Nausea Heartburn Diarrhea. Genitourinary No Difficult urination, Pus in urine,  Blood in urine. Musculoskeletal **+Joint pain/ Swelling** . Integumentary  (skin and/or breast) No Easy bruising, Rash, Sun sensitive (sun allergy).  Neurological System No, Headaches, Dizziness, Fainting. Psychiatric No  Excessive worries, Depression, Night sweats. Endocrine No Excessive thirst.  Hematologic/Lymphatic No Swollen glands, Anemia, Lymphadenopathy.  Allergic/Immunologic No Frequent sneezing, Increased susceptibility to  infection, Raynauds. Heart No Chest Pain, Heart murmur, Irregular heart beat.          Vital Signs    ---    Pain scale 3, Ht-in 62, Wt-lbs 207, BMI 37.86, BP 127/87, HR 97.      Examination    ---     _Rheumatology_ :    General Appearance Alert and oriented , No apparent distress .    HENT: No, alopecia .    Eyes: No, scleral icterus, scleral erythema .    CV: regular rate rhythm .    Ext: trace LE edema .    Skin: No, rash .    Psych: No anxiety depression.    Muskuloskeletal  Elbows, shoulders, hips, knees have intact  ROM with no  synovitis. .    Skin and Nails Minimal sclerodactyly.    _RAPID 3_ :    Function (0-10): 1.0.        Pain (0-10): 7.5.        Patient Global (0-10): 2.0.          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 Elevated liver enzymes - R74.8    ---    4\. Scleroderma - M34.9    ---    5\. Long-term use of high-risk medication - Z79.899    ---      She still has severe synovitis. 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 There is also significant risk of infection due  to her IgG4 deficiency. She has been on enbrel for 5 weeks now with no  appreciable benefit. We discussed the possibility of changing her medication  balanced with her infection risk and liver disease. We settle on another 2-3  weeks of enbrel and if that does not work, Humira may be an option. I will  plan to follow-up with her by phone at that time to solidify the treatment  plan. She is due for monitoring labs today. I will see her back in 3 months  for an in-office visit.    ---      Treatment    ---       **1\. Seronegative rheumatoid arthritis**    Stop Pantoprazole Sodium Tablet Delayed Release, 40 mg, take 1 tablet, Orally,  Once a day    Start Omeprazole Capsule Delayed Release, 40 mg, 1 capsule, Orally, Once a  day, 90 days, 90 Capsule, Refills 3    Start Ranitidine HCl Capsule, 150 mg, 1 capsule at bedtime, Orally, Once a  day, 90 days, 90 Capsule, Refills 3    _LAB: Alkaline Phosphatase (ALK)_   Alkaline Phosphatase (ALK)  358  H  40 -  130 - IU/L    --- --- --- ---    _LAB: Bilirubin, Total_ Bilirubin, Total  0.8    0.2 - 1.1 - mg/dL    --- --- --- ---    _LAB: Aspartate Aminotranferase (AST/SGOT)_ Aspartate Aminotranferase  (AST/SGOT)  56  H  10 - 42 - IU/L    --- --- --- ---    _LAB: Alanine Aminotransferase (ALT/SGPT)_ Alanine Aminotransferase  (ALT/SGPT)  135  H  0 - 54 - IU/L    --- --- --- ---    _LAB: Blood  Urea Nitrogen (BUN)_ Blood Urea Nitrogen  15    6 - 24 - mg/dL    --- --- --- ---    _LAB: Sedimentation Rate (ESR)_ Sed Rate  43  H  0 - 30 - mm    --- --- --- ---    _LAB: C-Reactive Protein - CRP_ C-Reactive Protein - CRP (Ultra-Wide Range)   4.37    0.00 - 7.48 - mg/L    --- --- --- ---    _LAB: CBC_DIFF_ WBC  8.0    4.0 - 11.0 - K/uL    --- --- --- ---    RBC  4.58    3.70 - 5.00 - M/uL    --- --- --- ---    HGB  13.9    11.0 - 15.0 - g/dL    --- --- --- ---    HCT  41.6    32.0 - 45.0 - %    --- --- --- ---    MCV  90.8    80.0 - 98.0 - fL    --- --- --- ---    MCH  30.3    26.0 - 34.0 - pg    --- --- --- ---    MCHC  33.4    32.0 - 36.0 - g/dL    --- --- --- ---    RDW  13.3    11.5 - 14.5 - %    --- --- --- ---    PLT  242    150 - 400 - K/uL    --- --- --- ---    MPV  10.5    9.1 - 11.7 - fL    --- --- --- ---    SEG NEUT  63     \- %    --- --- --- ---    LYMPH  25     \- %    --- --- --- ---    MONO  10     \- %    --- --- --- ---    EOS  2     \- %    --- --- --- ---    BASO  1     \- %    --- --- --- ---    NEUT #  5.0    1.5 - 7.5 - K/uL    --- --- --- ---    LYMPH #  2.0    1.0 - 4.0 - K/uL    --- --- --- ---    MONO #  0.8    0.2 - 0.8 - K/uL    --- --- --- ---    EOSIN #  0.2    0.0 - 0.5 - K/uL    --- --- --- ---    BASO #  0.0    0.0 - 0.2 - K/uL    --- --- --- ---    Imm Grnas  0     \- %    --- --- --- ---  Imm Grans, Abs  0.0    0.0 - 0.1 - K/uL    --- --- --- ---    _LAB: Creatinine (CR)_ Creatinine (CR)  0.86    0.57 - 1.30 - mg/dL    --- --- --- ---      Follow Up    ---    3 Months    Electronically signed by Erasmo Leventhal , MD on 05/14/2016 at 10:33 AM EST    Sign off status: Completed        * * *        Rheumatology    [address]    [place], [place] [address]    Tel: [phone]    Fax: [phone]              * * *          Patient: [name], [name] DOB: [birth year] Progress Note: [name], MD  05/05/2016    ---    Note generated by eClinicalWorks EMR/PM Software  ([email or web address])

## 2020-06-29 NOTE — Progress Notes (Signed)
* * *        **  [name]**    --- ---    31 Y old Female, DOB: 1961-01-09    [address] [place], [place] [address]    Home: [phone]    Provider: [name]        * * *    Telephone Encounter    ---    Answered by   Oswaldo Conroy  Date: 01/19/2017         Time: 11:37 AM    Reason   refill    --- ---            Message                      FYI HYDROXYCHLOROQINE 200mg                 Action Taken   Oswaldo Conroy 01/19/2017 11:39:01 AM >            Refills  Refill Hydroxychloroquine Sulfate Tablet, 200 mg, Orally, 30 Tablet,  1 tablet with food or milk, Once a day, 30 days, Refills=2    --- ---          * * *                ---          * * *          PatientDenny Peon, [name] DOB: May 02, 1960 Provider: [name] 01/19/2017    ---    Note generated by eClinicalWorks EMR/PM Software ([email or web address])

## 2020-06-29 NOTE — Progress Notes (Signed)
* * *        **  [name]**    --- ---    45 Y old Female, DOB: 04-24-1960    [address], [place], [place] [address]    Home: [phone]    Provider: [name]        * * *    Telephone Encounter    ---    Answered by   Angelica Pou  Date: 08/17/2016         Time: 10:22 AM    Caller   pt    --- ---            Reason   call back            Message                      Hello, pt request to speak with MD in regards to discussing recent flu like symptoms she have. She stated she is coughing, cannot sawllow, have bright yellow phlem and nose dripping. It started last night. She was prescribed with Humira and Enbrel and not sure ihow she should proceed. Pt's ph: [phone]. Thank you                Action Taken   [facility] 08/17/2016 10:23:24 AM > KUMAR,SONAM A, PA-C  08/17/2016 11:42:00 AM > Took Enbrel on Saturday. Developed viral symptoms  yesterday. Due for Humira on Saturday. Asked patient to track her symptoms. If  no improvement by Saturday/Sunday see PCP and hold on the humira dose until  she is feeling better.                * * *                ---          * * *          PatientHarlem [name], [name] DOB: [birth year] Provider: [name] 08/17/2016    ---    Note generated by eClinicalWorks EMR/PM Software ([email or web address])

## 2020-06-29 NOTE — Progress Notes (Signed)
* * *        [name], [name]**    --- ---    60 Y old Female, DOB: [birth year], External MRN: [id number]    Account Number: [id number]    [address] [name], [id number]    Home: [phone]    Guarantor: [name] Insurance: [place] Payer ID: PAPER    PCP: [name] Forts, MD Referring: [name] External Visit ID:  [id number]    Appointment Facility: Endocrinology        * * *    03/15/2016   **Appointment Provider:** [name], NP **CHN#:** [id number]    --- ---      **Supervising Provider:** Paulla Dolly, MD    ---        Current Medications    ---    Taking     * Baclofen 10 MG Tablet 1/2 tab Orally prn    ---    * Calcium Carbonate-Vitamin D 600-200 MG-UNIT Capsule 1 capsule with a meal Orally Twice a day    ---    * Enbrel SureClick 50 MG/ML Solution Auto-injector inject 50mg  (1 pen) sc Subcutaneous every week, Notes: not yet, still pending    ---    * GlipiZIDE XL 5 MG Tablet Extended Release 24 Hour 1 tablet Orally Once a day    ---    * Glucophage XR 500 mg Tablet Extended Release 24 Hour 4 tablets Orally Once a day    ---    * Hydroxychloroquine Sulfate 200 mg Tablet 1 tablet with food or milk Orally Once a day    ---    * Levothyroxine Sodium 25 MCG Tablet 1 tablet Orally Once a day    ---    * Omeprazole 40 mg Capsule Delayed Release 1 capsule Orally Once a day    ---    * PredniSONE 5 mg Tablet 2 tablet Orally Once a day    ---    * Rosuvastatin Calcium 10 MG Tablet 1 tablet Orally Once a day    ---    * Tramadol HCl 50 MG Tablet 1 tablet Orally every 6 hrs    ---    * Voltaren 1 % Gel 2 grams Transdermal twice daily, Notes: PRN    ---    Discontinued    * GLIPIZIDE 5MG  TABLETS 1 tablet daily    ---    * METFORMIN TAB 500MG  4 tablets daily    ---    * Medication List reviewed and reconciled with the patient    ---      Past Medical History    ---       Scleroderma - CREST dx 2007.        ---    IgG4 deficiency s/p IVIG 2010 ----single infusion given preventively after  week of bilateral  knee replacements at [facility] 2010.        ---    Fx left wrist in 1994.        ---    Blood clot at LUE in 2006 on a short course of Coumadin.        ---    Bilateral carpal tunnel syndrome s/p Lt carpal tunnel release and steroids  injection right--.        ---    Bone spur left foot.        ---    Scoliosis.        ---    Spinal stenosis s/p steroids  injection.        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 Katrinka Blazing 2010--- never infected, but packed with antibiotics with  surgery;.        ---    Right rotator cuff repairs x 4, complicated by repeat tears, infection,  placement of anchor material.        ---    Elevated liver function tests.        ---    Diabetes.        ---      Surgical History    ---      Right rotator cuff repair, with infected hardware that had to be removed  2006    ---    Repeat right shoulder surgery, also which became infected. 2007    ---    Left rotator cuff repair 2008    ---    bilateral knee replacements 2009    ---    Left arthroscopic carpal tunnel release 01/2011    ---    ORIF Left 4th metatarsal bone    ---    Left Ulna shortening 1994    ---    Knee replacement 2010    ---    Hand surgery 2013, 2015    ---    remove gallbladder/hernia 1990    ---    Plate left foot 3rd metatarsal 2008    ---      Social History    ---    Tobacco history: Never smoked.    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    Alcohol    _Former daily EtOH use in 20s_   Nonsmoker.    Lives with longstanding boyfriend.    ---      Allergies    ---      N.K.D.A.    ---    [name]]      Hospitalization/Major Diagnostic Procedure    ---      as above    ---      Review of Systems    ---     _Diabetes_ :    General: no unintended weight change, no fevers, malaise, weakness. Eyes: no  vision loss. Cardiovascular: no CP, no orthopnea, no palpitations.  Respiratory: no wheezing, no dyspnea. Gastrointestinal: no n/v, no abdominal  pain.  Musculoskeletal: no edema. Skin: no rash. Neurological: no numbness.            Reason for Appointment    ---      1\. IN-PT FU/DIABETES    ---      History of Present Illness    ---     _General_ :    [name] is a 60 y/o female with scleroderma (on prednisone currently), IgG-4  deficiency, NASH and uncontrolled T2DM. Pt is referred today for management of  ongoing hyperglycemia. She reports diabetes diagnosis >6 months ago, though  has had elevated hemoglobin a1c at 7% or greater since 12/2013. In May 2017 her  blood glucose significantly worsened with an a1c level of 11%. Currently she  is taking glipizide and metformin. Pt is checking her blood sugars  consistently, twice daily. She denies recent hypoglycemic symptoms although  had been symptomatically hypoglycemic to the 40-60's range on a higher dose of  glipizide-10mg  over the summer.    Diabetes Meds:    glipizide 5mg     metformin 2000mg  daily    no previous medications or insulin injections.  A1c Trend:    8.7%-----03/02/16    11%-----08/05/15    7.1%-----11/04/14    7%-----12/31/13    6.6%-----05/06/13.    Recent Blood Glucoses:    no meter or bg logs today    reports checking 2x daily    fasting bg range: 180-200    1 hr post meal: 230-280's.    Diet:    appetite is good    3 meals per dayno formal nutrition evaluation.    Exercise:    walks 1-2 mi daily with dog.    Microvascular Complications Screening:    eyes: no acute vision changes, no hx of retinopathy, reports annual exam with  ophthalmologist is scheduled    kidneys: creat:0.89, egfr:74, urine albumin:<0.5    feet: denies numbess or parethesias in feet, seen by Dr. Vassie Loll- hammering of  the lesser toes, given rx for custom orthotics.    Macrovascular Complications Screening:    no hx of MI, CVA or PVD    (+) hld    Lipids: TC:286, TG:179, HDL:65, LDL:185 (08/05/15)--started on 10mg  rosuvastatin  after this. Due for repeat.      Vital Signs    ---    Pain scale 5, Ht-in 62, Wt-lbs 212.6, BMI  38.88, BP 140/75, HR 90, BSA 2.05,  Ht-cm 157.48, Wt-kg 96.43, Wt Change -9.4 lb.      Physical Examination    ---     _Diabetes_ :    Gen: alert and oriented x3, no distress.    Eyes: nonicteric, PERRL.    Neck: supple, no lad.    Cardio: heart rate and rhythm regular, S1S2.    Resp: unlabored and even, clear to auscultation bilaterally.    GI: obese, nontender.    Musl: no peripheral edema, ambulates with antalgic gait.    Neuro: sensation intact to light touch, no skin breakdown.    >50% of this 40 minute visit spent counseling the patient.      Assessments    ---    1\. Other hyperlipidemia - E78.4 (Primary)    ---    2\. Type 2 diabetes mellitus with hyperglycemia, without long-term current use  of insulin - E11.65    ---    3\. Obesity (BMI 30-39.9) - E66.9    ---      Ms Stoecker is a 60 y/o female with uncontrolled T2DM. Today she was seen for  an initial visit in management of her ongoing hyperglycemia. Debbie's most  recent hemoglobin a1c was 8.7% (03/02/16). By her report her fasting and  postprandial bg readings sound high though no meter or bg logs for review. She  has not had formal nutrition counseling and would benefit from this education  going forward. Pt will set up an initial visit with nutrition to discuss the  diet and it's impact on blood glucose readings. She will also consistently  record both fasting and 2 hr post dinner bg readings for [place] to review next  time. I will not change the treatment until further bg data is reviewed. She  experienced hypoglycemia on higher doses of glipizide and is currently on the  maximum dose of metformin. Depending on the blood glucose patterns could  consider add on oral therapy or changing the glipizide to once daily basal  lantus injections. Will need to be cautious about add on oral therapy given  the hx of NASH with transaminitis.    Prior LDL was elevated at 185 (08/2015) although pt has started on high potency  statin-crestor since that time. She is  tolerating the current dose without  reported side effects.    ---      Treatment    ---       **1\. Other hyperlipidemia**    Continue Rosuvastatin Calcium Tablet, 10 MG, 1 tablet, Orally, Once a day    ---         **2\. Type 2 diabetes mellitus with hyperglycemia, without long-term current  use of insulin**    Continue GlipiZIDE XL Tablet Extended Release 24 Hour, 5 MG, 1 tablet, Orally,  Once a day    Continue Glucophage XR Tablet Extended Release 24 Hour, 500 mg, 4 tablets,  Orally, Once a day         **3\. Others**    Notes: I have reviewed the findings, assessment and treatment plan with [name], NP and have edited Progress Note as required. Will work on glucose  monitoring to look at patterns. Can then consider change of therapy to bring  down A1C without lows.      Follow Up    ---    4-6 weeks f/u, nutrition referral    **Appointment Provider:** [name], NP    Electronically signed by Paulla Dolly , MD on 03/17/2016 at 08:59 AM EST    Sign off status: Completed        * * *        Endocrinology    [address]    [place], [place] [address]    Tel: [phone]    Fax: [phone]              * * *          Patient: [name], [name] DOB: 14-Aug-1960 Progress Note: [name], NP  03/15/2016    ---    Note generated by eClinicalWorks EMR/PM Software ([email or web address])

## 2020-06-29 NOTE — Progress Notes (Signed)
* * *        [name]**    --- ---    52 Y old Female, DOB: May 21, 1960, External MRN: [id number]    Account Number: [id number]    [address] [place], [place]    Home: [phone]    Guarantor: [name] Insurance: [place] [place]    PCP: [name], MD Referring: Fortunato Curling, MD    Appointment Facility: Rheumatology, Allergy and Immunology        * * *    02/16/2017  Progress Notes: [name], MD **CHN#:** [id number]    --- ---    ---        Reason for Appointment    ---      1\. Seronegative RA follow up    ---      History of Present Illness    ---     _GENERAL_ :    She reports feeling notably worse for the last 2 months. Her hands are always  swollen, painful, cold - left worse than right. In addition, the rest of her  joints are painful.    She is being followed by Dr. Vassie Loll in podiatry for a torn tendon.    Question of basal cell cancer near her eye, seeing plastic surgery.    Also had low back pain that radiates down her leg, was unable to walk for 6  wks.    No side effects from medication but it is not helping.    I, Coletta Memos, acted as Neurosurgeon for [name]. Signature below:    Coletta Memos, Scribe 02/16/17, 10:01am.      Current Medications    ---    Taking     * Baclofen 10 MG Tablet 1/2 tab Orally prn    ---    * Calcium Carbonate-Vitamin D 600-200 MG-UNIT Capsule 1 capsule with a meal Orally Twice a day    ---    * GlipiZIDE XL 5 MG Tablet Extended Release 24 Hour 1 tablet Orally Once a day    ---    * Glucophage XR 500 mg Tablet Extended Release 24 Hour 4 tablets Orally Once a day    ---    * Hydroxychloroquine Sulfate 200 mg Tablet 1 tablet with food or milk Orally Once a day    ---    * Levothyroxine Sodium 25 MCG Tablet 1 tablet Orally Once a day    ---    * Omeprazole 40 mg Capsule Delayed Release 1 capsule Orally Once a day    ---    * PredniSONE 5 mg Tablet 2 tablet Orally Once a day    ---    * Ranitidine HCl 150 mg Capsule 1 capsule at bedtime Orally Once a day     ---    * Rosuvastatin Calcium 10 MG Tablet 1 tablet Orally Once a day    ---    * Sulindac 150 mg Tablet 1 tablet with food Orally Twice a day    ---    * Tramadol HCl 50 MG Tablet 1 tablet Orally every 6 hrs    ---    Not-Taking/PRN    * Humira Pen 40 MG/0.8ML Pen-injector Kit 0.8 ml Subcutaneous every 2 weeks    ---    * Voltaren 1 % Gel 2 grams Transdermal twice daily, Notes: PRN    ---    * Medication List reviewed and reconciled with the patient    ---  Past Medical History    ---       Scleroderma - CREST dx 2007.        ---    IgG4 deficiency s/p IVIG 2010 ----single infusion given preventively after  week of bilateral knee replacements at [facility] 2010.        ---    Fx left wrist in 1994.        ---    Blood clot at LUE in 2006 on a short course of Coumadin.        ---    Bilateral carpal tunnel syndrome s/p Lt carpal tunnel release and steroids  injection right--.        ---    Bone spur left foot.        ---    Scoliosis.        ---    Spinal stenosis s/p steroids injection.        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 Katrinka Blazing 2010--- never infected, but packed with antibiotics with  surgery;.        ---    Right rotator cuff repairs x 4, complicated by repeat tears, infection,  placement of anchor material.        ---    Elevated liver function tests.        ---    Diabetes.        ---      Surgical History    ---      Right rotator cuff repair, with infected hardware that had to be removed  2006    ---    Repeat right shoulder surgery, also which became infected. 2007    ---    Left rotator cuff repair 2008    ---    bilateral knee replacements 2009    ---    Left arthroscopic carpal tunnel release 01/2011    ---    ORIF Left 4th metatarsal bone    ---    Left Ulna shortening 1994    ---    Knee replacement 2010    ---    Hand surgery 2013, 2015    ---    remove gallbladder/hernia 1990    ---    Plate left foot 3rd metatarsal 2008    ---      Family  History    ---      Mother: deceased 36 yrs, lung cancer, hyperthyroidism, diagnosed with Cancer    ---    Father: deceased 44s yrs, heart attack, diagnosed with Heart Disease    ---    3 brother(s) .    ---    FH of arthritis and scleroderma    Father deceased from MI    Mother deceased age 34 with lung cancer    Brother with scleroderma / copd.    ---      Social History    ---    Tobacco history: Never smoked.    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    Alcohol  Former daily EtOH use in 20s.   Nonsmoker.    Lives with longstanding boyfriend.    ---      Allergies    ---      N.K.D.A.    ---      Hospitalization/Major Diagnostic Procedure    ---      as above    ---      Review of  Systems    ---     _ADULT Rheumatology ROS_ :    Constitutional No Recent weight gain, Fatigue, Chills. Eyes No Pain, Loss of  vision, Itching eyes. HENT No Headache Ringing in ears Loss of hearing.  Respiratory No Shortness of breath, Cough, Hemoptysis. Gastrointestinal  **+Heartburn**. Genitourinary No Difficult urination, Pus in urine, Blood in  urine. Musculoskeletal **+joint pain/ swelling, +muscle pain**. Integumentary  (skin and/or breast) No Easy bruising, Rash, Sun sensitive (sun allergy).  Neurological System No numbness or tingling, seizures, headache, weakness.  Psychiatric No Excessive worries, Depression, Night sweats. Endocrine No  Excessive thirst. Hematologic/Lymphatic No Swollen glands, Anemia,  Lymphadenopathy. Allergic/Immunologic No Frequent sneezing, Increased  susceptibility to infection, Raynauds. Heart No Chest Pain, Heart murmur,  Irregular heart beat. Cardiovascular System **+Swelling/ whitening of  hands/feet in cold** .          Vital Signs    ---    Pain scale 7.5, Ht-in 62, Wt-lbs 210, BMI 38.41, BP 120/81, HR 99.      Examination    ---     _Rheumatology_ :    General Appearance Alert and oriented , No apparent distress .    HENT: No, alopecia .    Eyes: No, scleral icterus, scleral erythema .     CV: regular rate rhythm .    Ext: trace LE edema .    Skin: No, rash .    Psych: No anxiety depression.    Muskuloskeletal Elbows, shoulders, hips, knees have intact ROM with no  synovitis. Hands have synovitis in the PIPs and MCPs bilaterally..    Skin and Nails Minimal sclerodactyly.    synovitis in MCPs and PIPs bilaterally.     _RAPID 3_ :    Function (0-10): 0.7.        Pain (0-10): 7.5.        Patient Global (0-10): 1.0.          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 Long-term use of high-risk medication - Z79.899    ---    3\. Hammertoe of second toe of left foot - M20.42    ---    4\. 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 She needs  additional treatment and we spoke about Plaquenil as a good option.  Additionally, I think she will need additional immunosuppression, which is  complicated by her IGG4 deficiency and recent shingles due to  immunosuppression. She has had recurrent infections in the past thought to be  related to IGG4d, and was successfully treated then with IVIG. I think that  her recent episode of shingles is highly likely to be related to this problem  as well. Given her successful treatment in the past, I recommended that we  initiate IVIg treatment. It may improve her autoimmune disease in its own  right, but also should bolster her immune system and prevent shingles in the  even we need to give her another agent for her inflammatory arthritis. She is  comfortable with this plan. I will arrange home infusion for the IVIG and see  her back in 2 months for further evaluation.    ---      Treatment    ---       **1\. Seronegative rheumatoid arthritis**    Refill Hydroxychloroquine Sulfate Tablet, 200  mg, 1 tablet with food or milk,  Orally, Once a day, 90 days, 90, Refills 1    Refill Omeprazole Capsule Delayed Release, 40 mg, 1 capsule, Orally, Once a  day, 90 days, 90, Refills 1     Refill Ranitidine HCl Capsule, 150 mg, 1 capsule at bedtime, Orally, Once a  day, 90 days, 90, Refills 1    Refill PredniSONE Tablet, 10 MG, 2 tablet, Orally, Once a day, 90 days, 180,  Refills 1    _LAB: Alkaline Phosphatase (ALK)_   Alkaline Phosphatase (ALK)  234  H  40 -  130 - IU/L    --- --- --- ---    _LAB: Bilirubin, Total (TBIL)_ Bilirubin, Total  1.1    0.2 - 1.1 - mg/dL    --- --- --- ---    _LAB: Aspartate aminotransferase (AST)_ Aspartate Aminotranferase (AST/SGOT)   43  H  10 - 42 - IU/L    --- --- --- ---    _LAB: Alanine aminotransferase (ALT)_ Alanine Aminotransferase (ALT/SGPT)  77   H  0 - 54 - IU/L    --- --- --- ---    _LAB: Blood Urea Nitrogen (BUN)_ Blood Urea Nitrogen  19    6 - 24 - mg/dL    --- --- --- ---    _LAB: Sed Rate ESR (ESR)_ Sed Rate  28    0 - 30 - mm    --- --- --- ---    _LAB: C-Reactive Protein, High sens (CRPHS)_    _LAB: CBC/DIFF with PLT (CBCWD)_ WBC  7.0    4.0 - 11.0 - K/uL    --- --- --- ---    RBC  4.61    3.70 - 5.00 - M/uL    --- --- --- ---    HGB  14.2    11.0 - 15.0 - g/dL    --- --- --- ---    HCT  42.9    32.0 - 45.0 - %    --- --- --- ---    MCV  93.1    80.0 - 98.0 - fL    --- --- --- ---    MCH  30.8    26.0 - 34.0 - pg    --- --- --- ---    MCHC  33.1    32.0 - 36.0 - g/dL    --- --- --- ---    RDW  13.7    11.5 - 14.5 - %    --- --- --- ---    PLT  208    150 - 400 - K/uL    --- --- --- ---    MPV  10.8    9.1 - 11.7 - fL    --- --- --- ---    SEG NEUT  53     \- %    --- --- --- ---    LYMPH  36     \- %    --- --- --- ---    MONO  7     \- %    --- --- --- ---    EOS  3     \- %    --- --- --- ---    BASO  1     \- %    --- --- --- ---    NEUT #  3.7    1.5 - 7.5 - K/uL    --- --- --- ---    LYMPH #  2.6  1.0 - 4.0 - K/uL    --- --- --- ---    MONO #  0.5    0.2 - 0.8 - K/uL    --- --- --- ---    EOSIN #  0.2    0.0 - 0.5 - K/uL    --- --- --- ---    BASO #  0.0    0.0 - 0.2 - K/uL    --- --- --- ---    Imm Grnas  0     \- %    --- --- --- ---    Imm  Grans, Abs  0.0    0.0 - 0.1 - K/uL    --- --- --- ---    _LAB: Creatinine (CR)_ Creatinine (CR)  0.85    0.57 - 1.30 - mg/dL    --- --- --- ---      Follow Up    ---    2 Months    Electronically signed by Erasmo Leventhal , MD on 03/13/2017 at 12:45 PM EST    Sign off status: Completed        * * *        Rheumatology, Allergy and Immunology    [address]    [place], [place] [address]    Tel: [phone]    Fax: [phone]              * * *          Patient: [name], [name] DOB: 24-Feb-1961 Progress Note: [name], MD  02/16/2017    ---    Note generated by eClinicalWorks EMR/PM Software ([email or web address])

## 2020-06-29 NOTE — Progress Notes (Signed)
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 Patient will like to change from 05-08-16 to 05-11-16. Patient can be reached at [phone] or [phone].                Action Taken   Dorrelus,Fedner 05/03/2016 10:34:21 AM > Persico,Claudio  05/03/2016 11:05:34 AM > , Action - Pt telephoned. Spoke to pt. We will fax a  work note stating the return to work on 05/12/15                * * *                ---          * * *          Patient: [name] DOB: 1961/04/01 Provider: Yetta Numbers 05/03/2016    ---    Note generated by eClinicalWorks EMR/PM Software ([email or web address])

## 2020-06-29 NOTE — Progress Notes (Signed)
* * *        [name]**    --- ---    39 Y old Female, DOB: September 08, 1960, External MRN: [id number]    Account Number: [id number]    [address] [name], [id number]    Home: [phone]    Guarantor: [name] Insurance: NHP [place]    PCP: [name], MD Referring: [name]    Appointment Facility: [facility]        * * *    03/13/2016  Progress Notes: Yetta Numbers, MD **CHN#:** [id number]    --- ---    ---        Reason for Appointment    ---      1\. POST OP FU LT RTSA    ---      History of Present Illness    ---     _GENERAL_ :    f/u. pain improving.      Current Medications    ---    Unknown     * Baclofen 10 MG Tablet 1/2 tab Orally prn    ---    * Enbrel SureClick 50 MG/ML Solution Auto-injector inject 50mg  (1 pen) sc Subcutaneous every week, Notes: not yet, still pending    ---    * GLIPIZIDE 5MG  TABLETS 1 tablet daily    ---    * Hydroxychloroquine Sulfate 200 mg Tablet 1 tablet with food or milk Orally Once a day    ---    * Levothyroxine Sodium 50 MCG Tablet 1 tablet Orally Once a day    ---    * METFORMIN TAB 500MG  4 tablets daily    ---    * Omeprazole 40 mg Capsule Delayed Release 1 capsule Orally Once a day    ---    * PredniSONE 5 mg Tablet 2 tablet Orally Once a day    ---    * Tramadol HCl 50 MG Tablet 1 tablet Orally every 6 hrs    ---    * Voltaren 1 % Gel 2 grams Transdermal twice daily, Notes: PRN    ---    * Medication List reviewed and reconciled with the patient    ---      Past Medical History    ---       Scleroderma - CREST dx 2007.        ---    IgG4 deficiency s/p IVIG 2010 ----single infusion given preventively after  week of bilateral knee replacements at [facility] 2010.        ---    Fx left wrist in 1994.        ---    Blood clot at LUE in 2006 on a short course of Coumadin.        ---    Bilateral carpal tunnel syndrome s/p Lt carpal tunnel release and steroids  injection right--.        ---    Bone spur left foot.        ---    Scoliosis.         ---    Spinal stenosis s/p steroids injection.        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 Katrinka Blazing 2010--- never infected, but packed with antibiotics with  surgery;.        ---    Right rotator cuff repairs x 4, complicated by repeat  tears, infection,  placement of anchor material.        ---    Elevated liver function tests.        ---    Diabetes.        ---      Surgical History    ---      Right rotator cuff repair, with infected hardware that had to be removed  2006    ---    Repeat right shoulder surgery, also which became infected. 2007    ---    Left rotator cuff repair 2008    ---    bilateral knee replacements 2009    ---    Left arthroscopic carpal tunnel release 01/2011    ---    ORIF Left 4th metatarsal bone    ---    Left Ulna shortening 1994    ---    Knee replacement 2010    ---    Hand surgery 2013, 2015    ---    remove gallbladder/hernia 1990    ---    Plate left foot 3rd metatarsal 2008    ---      Family History    ---      Mother: deceased 59 yrs, lung cancer, hyperthyroidism, diagnosed with Cancer    ---    Father: deceased 32s yrs, heart attack, diagnosed with Heart Disease    ---    3 brother(s) .    ---    FH of arthritis and scleroderma    Father deceased from MI    Mother deceased age 63 with lung cancer    Brother with scleroderma / copd.    ---      Social History    ---    Tobacco history: Never smoked.    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    Alcohol  Former daily EtOH use in 20s.   Nonsmoker.    Lives with longstanding boyfriend.    ---      Allergies    ---      N.K.D.A.    ---      Hospitalization/Major Diagnostic Procedure    ---      as above    ---      Review of Systems    ---     _ORT_ :    General Negative for fevers, chills, or night sweats. Cardiac and Respiratory  Negative for shortness of breath or chest pain. Genitourinary Negative for  dysuria or frequency. Skin Negative for rashes or ulcers.  Neurologic Negative  for sudden loss of vision, hearing, or speech, No numbness or tingling, No  weakness. Other The remainder of review of systems was discussed with patient  and is negative. Eyes No. Ear, Nose Throat No. Digestion, Stomach, Bowel No.  Bladder Problems No. Bleeding Problems No. Numbness/Tingling No.  Anxiety/Depression No. Fever/Chills/Fatigue No. Chest  Pain/Tightness/Palpitations No. Skin Rash No. Dental Problems No. Joint/Muscle  Pain/Cramps No. Blackout/Fainting No. lower back pain negative. left shoulder  pain negative. bilateral shoulder pain negative. right knee pain negative.  left knee pain negative. right hip pain negative. left hip pain negative.  bilateral hip pain negative. right ankle pain negative. left ankle pain  negative. bilateral ankle pain negative. Limb/Joint Pain No. Numbness/Tingling  No.          Vital Signs    ---    Pain scale 5.      Physical Examination    ---  appears healthy. Aquacel removed, skin intact, incision healing without signs  of infection, sensation intac tto light touch. fingersp pink and warm AROM 90  degrees forward elevation.      Assessments    ---    1\. 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 Left rotator cuff tear arthropathy**    Notes: new sutures removed, steristrips applied. Given script for PT. no  weightbearing on left. avoid ER >30.    ---      Follow Up    ---    6 wks with Xrays left shoulder    Electronically signed by Yetta Numbers , MD on 03/17/2016 at 01:10 PM EST    Sign off status: Completed        * * *        [facility]    [address]    [place], [place] [address]    Tel: [phone]    Fax: [phone]              * * *          Patient: [name], [name] DOB: 11/04/1960 Progress Note: Yetta Numbers, MD  03/13/2016    ---    Note generated by eClinicalWorks EMR/PM Software ([email or web address])

## 2020-06-29 NOTE — Progress Notes (Signed)
* * *        **  [name]**    --- ---    62 Y old Female, DOB: [birth year]    [address], [place], [place] [address]    Home: [phone]    Provider: [name]        * * *    Telephone Encounter    ---    Answered by   Cline Crock  Date: 11/03/2015         Time: 02:28 PM    Refills  Refill Omeprazole Capsule Delayed Release, 40 mg, Orally, 60 Capsule,  1 capsule, Once a day, 60 days, Refills=3    --- ---          * * *                ---          * * *          PatientDenny [name], [name] DOB: 01/05/1961 Provider: [name] 11/03/2015    ---    Note generated by eClinicalWorks EMR/PM Software ([email or web address])

## 2020-06-29 NOTE — Progress Notes (Signed)
* * *        **  [name]**    --- ---    43 Y old Female, DOB: January 30, 1961    [address] [place], [place] [address]    Home: [phone]    Provider: [name]        * * *    Telephone Encounter    ---    Answered by   [name]  Date: 01/23/2018         Time: 02:54 PM    Action Taken                      Christyne Mccain F 01/23/2018 2:55:04 PM > Conducted peer review for [facility] about Orencia.  I provided most recent LFTs and description of zoster event.  They agreed IL-6 and JAK was inappropriate and agreed to cover Orencia.        --- ---                * * *                ---          * * *          Patient: [name], [name] DOB: [birth year] Provider: [name] 01/23/2018    ---    Note generated by eClinicalWorks EMR/PM Software ([email or web address])

## 2020-06-29 NOTE — Progress Notes (Signed)
* * *        [name]**    --- ---    80 Y old Female, DOB: 1960-09-19, External MRN: [id number]    Account Number: [id number]    [address], [place]    Home: [phone]    Guarantor: [name] Insurance: [id number] NHP PPO    PCP: [name] Referring: [name]    Appointment Facility: [facility]umatology, All[facility] Immunology        * * *    09/14/2017  Progress Notes: [name], MD **CHN#:** [phone]    --- ---    ---         **Reason for Appointment**    ---       1\. RA    ---       **History of Present Illness**    ---     _GENERAL_ :    [name] presents in follow up today for RA. Saw Dr. [name] who injected L  hand. She has felt some improvement in hand pain since starting the infusions  (today was 6th infusion).    Aside from hand pain:    Periodic knee pain, bothered by the weather    limited in walking by achilles    Right shoulder is getting worse    Collarbone aches all the time    lower back pain - attributes to spinal stenosis    Following up with PCP today for afib issue, EKG unremarkable    I, Coletta Memos, acted as Neurosurgeon for [name]. Signature below:    Coletta Memos, Scribe 09/14/17.       **Current Medications**    ---    Taking     * Baclofen 10 MG Tablet 1/2 tab Orally prn    ---    * Calcium Carbonate-Vitamin D 600-200 MG-UNIT Capsule 1 capsule with a meal Orally Twice a day    ---    * GlipiZIDE XL 5 MG Tablet Extended Release 24 Hour 1 tablet Orally Once a day    ---    * Glucophage XR 500 mg Tablet Extended Release 24 Hour 4 tablets Orally Once a day    ---    * Hydroxychloroquine Sulfate 200 mg Tablet 1 tablet with food or milk Orally Once a day    ---    * Levothyroxine Sodium 25 MCG Tablet 1 tablet Orally Once a day    ---    * Omeprazole 40 mg Capsule Delayed Release 1 capsule Orally Once a day    ---    * PredniSONE 10 MG Tablet 2 tablet Orally Once a day    ---    * Ranitidine HCl 150 mg Capsule 1 capsule at bedtime Orally Once a day    ---    *  Rosuvastatin Calcium 10 MG Tablet 1 tablet Orally Once a day    ---    * Sulindac 150 mg Tablet 1 tablet with food Orally Twice a day    ---    * Tramadol HCl 50 MG Tablet 1 tablet Orally every 6 hrs    ---    Not-Taking/PRN    * Humira Pen 40 MG/0.8ML Pen-injector Kit 0.8 ml Subcutaneous every 2 weeks    ---    * Voltaren 1 % Gel 2 grams Transdermal twice daily, Notes: PRN    ---    * Medication List reviewed and reconciled with the patient    ---       **  Past Medical History**    ---       Scleroderma - CREST dx 2007.        ---    IgG4 deficiency s/p IVIG 2010 ----single infusion given preventively after  week of bilateral knee replacements at [facility] 2010.        ---    Fx left wrist in 1994.        ---    Blood clot at LUE in 2006 on a short course of Coumadin.        ---    Bilateral carpal tunnel syndrome s/p Lt carpal tunnel release and steroids  injection right--.        ---    Bone spur left foot.        ---    Scoliosis.        ---    Spinal stenosis s/p steroids injection.        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 Katrinka Blazing 2010--- never infected, but packed with antibiotics with  surgery;.        ---    Right rotator cuff repairs x 4, complicated by repeat tears, infection,  placement of anchor material.        ---    Elevated liver function tests.        ---    Diabetes.        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    Alcohol  Former daily EtOH use in 20s.    Tobacco history: Never smoked.   Nonsmoker.    Lives with longstanding boyfriend.    ---       **Allergies**    ---       N.K.D.A.    ---       **Review of Systems**    ---     _ADULT Rheumatology ROS_ :    Constitutional No Recent weight gain, Fatigue, Chills. Eyes No Pain, Loss of  vision, Itching eyes. HENT No Headache Ringing in ears Loss of hearing.  Respiratory No Shortness of breath, Cough, Hemoptysis. Gastrointestinal  **+Heartburn**.  Genitourinary No Difficult urination, Pus in urine, Blood in  urine. Musculoskeletal **+joint pain/ swelling, +muscle pain**. Integumentary  (skin and/or breast) No Easy bruising, Rash, Sun sensitive (sun allergy).  Neurological System No numbness or tingling, seizures, headache, weakness.  Psychiatric No Excessive worries, Depression, Night sweats. Endocrine No  Excessive thirst. Hematologic/Lymphatic No Swollen glands, Anemia,  Lymphadenopathy. Allergic/Immunologic No Frequent sneezing, Increased  susceptibility to infection, Raynauds. Heart No Chest Pain, Heart murmur,  Irregular heart beat. Cardiovascular System **+Swelling/ whitening of  hands/feet in cold** .          **Vital Signs**    ---    Pain scale 5.5, Ht-in 62, Wt-lbs 182, BMI 33.28, BP 113/78, HR 109.       **Examination**    ---     _Rheumatology_ :    General Appearance Alert and oriented , No apparent distress .    HENT: No, alopecia .    Eyes: No, scleral icterus, scleral erythema .    CV: regular rate rhythm .    Ext: trace LE edema .    Skin: No, rash .    Psych: No anxiety depression.    Muskuloskeletal Elbows, shoulders, hips, knees have intact ROM with no  synovitis. Hands  have synovitis in the PIPs and MCPs bilaterally..    Skin and Nails Minimal sclerodactyly.    synovitis in MCPs and PIPs bilaterally.           **Assessments**    ---    1\. 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 Long-term use of high-risk medication - Z79.899    ---    3\. Hammertoe of second toe of left foot - M20.42    ---    4\. 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 She needs additional treatment and  we spoke about adding another biologic, either Humira or [place] wile she  continues IVIG. She is comfortable with this plan. She is due for repeat  monitoring labs and I will recheck her Ig levels as well. I will speak with  our  pharmacist to consider next treatment options and FU with her in 1-2 weeks  by phone. Otherwise I will see her back in 3 months for further evaluation.    ---       **Treatment**    ---       **1\. Seronegative rheumatoid arthritis**    _LAB: Alkaline Phosphatase (ALK)_   Alkaline Phosphatase (ALK)  445  H  40 -  130 - IU/L    --- --- --- ---    _LAB: Bilirubin, Total (TBIL)_ Bilirubin, Total  1.4  H  0.2 - 1.1 - mg/dL    --- --- --- ---    _LAB: Aspartate aminotransferase (AST)_ Aspartate Aminotranferase (AST/SGOT)   40    10 - 42 - IU/L    --- --- --- ---    _LAB: Alanine aminotransferase (ALT)_ Alanine Aminotransferase (ALT/SGPT)   126  H  0 - 54 - IU/L    --- --- --- ---    _LAB: Blood Urea Nitrogen (BUN)_ Blood Urea Nitrogen  18    6 - 24 - mg/dL    --- --- --- ---    _LAB: Sed Rate ESR (ESR)_ Sed Rate  86  H  0 - 30 - mm    --- --- --- ---    _LAB: Immunoglobulin G IgG (IGG)_ Immunoglobulin G  1268    540 - 1822 - mg/dL    --- --- --- ---    _LAB: C-Reactive Protein (CRP)_ C-Reactive Protein - CRP (Ultra-Wide Range)   6.60    0.00 - 7.48 - mg/L    --- --- --- ---    _LAB: IgG Subcls (IGGSB)_    _LAB: CBC/DIFF with PLT (CBCWD)_ WBC  12.4  H  4.0 - 11.0 - K/uL    --- --- --- ---    RBC  4.26    3.70 - 5.00 - M/uL    --- --- --- ---    HGB  13.3    11.0 - 15.0 - g/dL    --- --- --- ---    HCT  39.8    32.0 - 45.0 - %    --- --- --- ---    MCV  93.4    80.0 - 98.0 - fL    --- --- --- ---    MCH  31.2    26.0 - 34.0 - pg    --- --- --- ---    MCHC  33.4  32.0 - 36.0 - g/dL    --- --- --- ---    RDW  13.2    11.5 - 14.5 - %    --- --- --- ---    PLT  224    150 - 400 - K/uL    --- --- --- ---    MPV  10.9    9.1 - 11.7 - fL    --- --- --- ---    SEG NEUT  77     \- %    --- --- --- ---    LYMPH  16     \- %    --- --- --- ---    MONO  6     \- %    --- --- --- ---    EOS  0     \- %    --- --- --- ---    BASO  0     \- %    --- --- --- ---    NEUT #  9.5  H  1.5 - 7.5 - K/uL    --- --- --- ---    LYMPH #  2.0     1.0 - 4.0 - K/uL    --- --- --- ---    MONO #  0.7    0.2 - 0.8 - K/uL    --- --- --- ---    EOSIN #  0.0    0.0 - 0.5 - K/uL    --- --- --- ---    BASO #  0.0    0.0 - 0.2 - K/uL    --- --- --- ---    Imm Grnas  0     \- %    --- --- --- ---    NRBC  0     \- %    --- --- --- ---    Imm Grans, Abs  0.1    0.0 - 0.1 - K/uL    --- --- --- ---    NRBC, Abs  0.0    <0.0 - K/uL    --- --- --- ---    _LAB: Electrolytes (Na, K, Cl, CO2) LYTES_ ANION GAP  11    3 - 14 -    --- --- --- ---    CL  99    98 - 110 - mEq/L    --- --- --- ---    CO2  23    20 - 30 - mEq/L    --- --- --- ---    K  4.3    3.6 - 5.1 - mEq/L    --- --- --- ---    NA  133  L  135 - 145 - mEq/L    --- --- --- ---    _LAB: Creatinine (CR)_ Creatinine (CR)  0.91    0.57 - 1.30 - mg/dL    --- --- --- ---       **Follow Up**    ---    3 Months    Electronically signed by Erasmo Leventhal , MD on 09/16/2017 at 06:11 PM EDT    Sign off status: Completed        * * *        [facility]    [address]    [place] [facility], [address] [place], [place] [address]    Tel: [phone]    Fax: [phone]              * * *  Patient: [name], [name] DOB: [birth year] Progress Note: [name], MD  09/14/2017    ---    Note generated by eClinicalWorks EMR/PM Software ([email or web address])

## 2020-06-29 NOTE — Progress Notes (Signed)
* * *        [name]**    --- ---    60 Y old Female, DOB: 08/09/1960, External MRN: [id number]    Account Number: [id number]    [address] [name], [id number]    Home: [phone]    Guarantor: [name] Insurance: NHP [place]    PCP: [name], MD Referring: Fortunato Curling, MD    Appointment Facility: [facility]        * * *    01/28/2016   **Appointment Provider:** Alfonse Ras **CHN#:** [id number]    --- ---      **Supervising Provider:** Yetta Numbers, MD    ---        Reason for Appointment    ---      1\. FU Left Shoulder MRI RESULTS    ---      History of Present Illness    ---     _GENERAL_ :    [name] is a 60 year old RHD female who presents for follow up of her MRI  results for her left shoulder evaluation. She continues to have limited range  of motion and increased pain since her visit 3 days ago. Denies any numbness  or tingling of the upper extremity and no motor deficits distal to the left  shoulder.      Current Medications    ---    Taking     * Baclofen 10 MG Tablet 1/2 tab Orally prn    ---    * Enbrel SureClick 50 MG/ML Solution Auto-injector inject 50mg  (1 pen) sc Subcutaneous every week    ---    * GLIPIZIDE 5MG  TABLETS 1 tablet daily    ---    * Hydroxychloroquine Sulfate 200 mg Tablet 1 tablet with food or milk Orally Once a day    ---    * Levothyroxine Sodium 50 MCG Tablet 1 tablet Orally Once a day    ---    * METFORMIN TAB 500MG  4 tablets daily    ---    * Omeprazole 40 mg Capsule Delayed Release 1 capsule Orally Once a day    ---    * PredniSONE 5 mg Tablet 2 tablet Orally Once a day    ---    * Tramadol HCl 50 MG Tablet 1 tablet Orally every 6 hrs    ---    * Voltaren 1 % Gel 2 grams Transdermal twice daily, Notes: PRN    ---    * Medication List reviewed and reconciled with the patient    ---      Past Medical History    ---       Scleroderma - CREST dx 2007.        ---    IgG4 deficiency s/p IVIG 2010 ----single infusion given preventively  after  week of bilateral knee replacements at [facility] 2010.        ---    Fx left wrist in 1994.        ---    Blood clot at LUE in 2006 on a short course of Coumadin.        ---    Bilateral carpal tunnel syndrome s/p Lt carpal tunnel release and steroids  injection right--.        ---    Bone spur left foot.        ---    Scoliosis.        ---  Spinal stenosis s/p steroids injection.        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 Katrinka Blazing 2010--- never infected, but packed with antibiotics with  surgery;.        ---    Right rotator cuff repairs x 4, complicated by repeat tears, infection,  placement of anchor material.        ---    Elevated liver function tests.        ---      Surgical History    ---      Right rotator cuff repair, with infected hardware that had to be removed  2006    ---    Repeat right shoulder surgery, also which became infected. 2007    ---    Left rotator cuff repair 2008    ---    bilateral knee replacements 2009    ---    Left arthroscopic carpal tunnel release 01/2011    ---    ORIF Left 4th metatarsal bone    ---    Left Ulna shortening 1994    ---      Family History    ---      Mother: deceased 55 yrs, lung cancer, hyperthyroidism, diagnosed with Cancer    ---    Father: deceased 58s yrs, heart attack, diagnosed with Heart Disease    ---    3 brother(s) .    ---    FH of arthritis and scleroderma    Father deceased from MI    Mother deceased age 55 with lung cancer    Brother with scleroderma / copd.    ---      Social History    ---    Tobacco history: Never smoked.    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    Alcohol    _Former daily EtOH use in 20s_   Nonsmoker.    Lives with longstanding boyfriend.    ---      Allergies    ---      N.K.D.A.    ---      Hospitalization/Major Diagnostic Procedure    ---      as above    ---      Review of Systems    ---     _ORT_ :    Eyes No. Ear, Nose Throat No. Digestion, Stomach,  Bowel No. Bladder Problems  No. Bleeding Problems No. Numbness/Tingling Yes. Anxiety/Depression No.  Fever/Chills/Fatigue No. Chest Pain/Tightness/Palpitations No. Skin Rash No.  Dental Problems No. Joint/Muscle Pain/Cramps Yes. Blackout/Fainting No. Other  No.    Positive for musculoskeletal as reported in HPI.    Negative for fever, chills, fatigue, night sweats, chest pain, chest  tightness, difficulty breathing, heart palpitations, skin rash, tooth/mouth  pain, blackout, fainting, blurry vision, double vision, stomach pain,  heartburn, constipation, diarrhea, dysuria, hematuria, bleeding problems,  anxiety.      Vital Signs    ---    Pain scale 10, Ht-in 62, Ht-cm 157.48.      Physical Examination    ---    General - Sitting comfortably in NAD    Observation - Lidocaine patch on lateral aspect of arm.    LUE- Some mild swelling of the shoulder. No bony deformity. She has focal  tenderness of the AC joint and periscapular area. Active forward flexion to 70  degrees, passively improves to 160 but uncomfortable. Active abduction to 60  degrees and passively able to abduct fully with mild discomfort. Active ER  equal to contralateral shoulder. Active IR to the lumbar spine, equal  bilaterally. Pain with crossover testing. Sensation intact to light touch in  median, ulnar and radial nerve distributions. Hand is warm and well perfused.    IMAGING: MRI of the left shoulder reviewed by US shows acute on chonic rotator  cuff tear, retraction of the rotator cuff tendon and fatty atrophy of the  supraspinatus and infraspinatus. Humeral head is displaced superiorly towards  the acromion.      Assessments    ---    1\. Complete tear of left rotator cuff - M75.122 (Primary)    ---      Treatment    ---       **1\. Complete tear of left rotator cuff**    Notes: Notes: Patient was seen today and MRI results were discussed with Dr.  [name]. It was explained to the patient that her injury was likely an acute  on chronic tear of  her rotator cuff, however due to the chronic atrophy of the  supraspinatus and infraspinatus the patient is not a candidate for a rotator  cuff repair. Her options include injection and physical therapy or a reverse  total shoulder replacement, both were discussed with the patient. Risks of  surgery, including but not limited to infection, nerve damage, stiffness,  impkant looseing or dislocation, and incomplete symptom relief were explained.  She has decided to think about it, discuss with her job and will follow up  with Erasmo Score to schedule.    ---      Follow Up    ---    prn    **Appointment Provider:** Alfonse Ras    Electronically signed by Yetta Numbers , MD on 01/28/2016 at 09:44 AM EDT    Sign off status: Completed        * * *        [facility]    [address]    [place], [place] [address]    Tel: [phone]    Fax: [phone]              * * *          Patient: [name], [name] DOB: 1961-03-16 Progress Note: Alfonse Ras  01/28/2016    ---    Note generated by eClinicalWorks EMR/PM Software ([email or web address])

## 2020-06-29 NOTE — Progress Notes (Signed)
* * *        **  [name]**    --- ---    [name], DOB: 30-May-1960    [address] [name], [place] [address]    Home: [phone]    Provider: Yetta Numbers        * * *    Telephone Encounter    ---    Answered by   Matthias Hughs  Date: 04/25/2016         Time: 08:59 AM    Caller   Patient    --- ---            Reason   Schedule Urgent Appointment            Message                      Good morning [name],      Patient believes she has re-injured the left shoulder yesterday while going to the rest room. She states she is in pain and has limited motion. Patient would like to schedule an appointment with Dr. [name] as soon as possible. Patient has had surgery 03/07/16 and Post Op 04/21/16. Please contact patient at phone number [phone]. Thank you.                Action Taken   [facility] 04/25/2016 9:07:19 AM > Persico,Claudio 04/25/2016  9:33:54 AM > Spoke to Guin. I scheduled her to come in tomorrow morning to be  seen by Dr. Abbe Amsterdam.                * * *                ---          * * *          Patient: [name], [name] DOB: 05-Mar-1961 Provider: Yetta Numbers 04/25/2016    ---    Note generated by eClinicalWorks EMR/PM Software ([email or web address])

## 2020-06-29 NOTE — Progress Notes (Signed)
* * *        [name], [name]**    --- ---    60 Y old Female, DOB: 12-24-1954, External MRN: [id number]    Account Number: [id number]    [address], [place], [place]    Home: [phone]    Guarantor: [name] Insurance: [employer] [place] Payer ID: PAPER    PCP: [name] Forts, MD Referring: [name] Curling, MD External Visit ID:  [id number]    Appointment Facility: [facility]        * * *    03/23/2017  Progress Notes: [name], DPM **CHN#:** [phone]    --- ---    ---        Current Medications    ---    Taking     * Baclofen 10 MG Tablet 1/2 tab Orally prn    ---    * Calcium Carbonate-Vitamin D 600-200 MG-UNIT Capsule 1 capsule with a meal Orally Twice a day    ---    * GlipiZIDE XL 5 MG Tablet Extended Release 24 Hour 1 tablet Orally Once a day    ---    * Glucophage XR 500 mg Tablet Extended Release 24 Hour 4 tablets Orally Once a day    ---    * Hydroxychloroquine Sulfate 200 mg Tablet 1 tablet with food or milk Orally Once a day    ---    * Levothyroxine Sodium 25 MCG Tablet 1 tablet Orally Once a day    ---    * Omeprazole 40 mg Capsule Delayed Release 1 capsule Orally Once a day    ---    * PredniSONE 10 MG Tablet 2 tablet Orally Once a day    ---    * Ranitidine HCl 150 mg Capsule 1 capsule at bedtime Orally Once a day    ---    * Rosuvastatin Calcium 10 MG Tablet 1 tablet Orally Once a day    ---    * Sulindac 150 mg Tablet 1 tablet with food Orally Twice a day    ---    * Tramadol HCl 50 MG Tablet 1 tablet Orally every 6 hrs    ---    Not-Taking/PRN    * Humira Pen 40 MG/0.8ML Pen-injector Kit 0.8 ml Subcutaneous every 2 weeks    ---    * Voltaren 1 % Gel 2 grams Transdermal twice daily, Notes: PRN    ---      Past Medical History    ---       Scleroderma - CREST dx 2007.        ---    IgG4 deficiency s/p IVIG 2010 ----single infusion given preventively after  week of bilateral knee replacements at [name] 2010.        ---    Fx left wrist in 1994.        ---    Blood clot at LUE in 2006 on a  short course of Coumadin.        ---    Bilateral carpal tunnel syndrome s/p Lt carpal tunnel release and steroids  injection right--.        ---    Bone spur left foot.        ---    Scoliosis.        ---    Spinal stenosis s/p steroids injection.        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 Katrinka [name] 2010--- never infected, but packed with antibiotics with  surgery;.        ---    Right rotator cuff repairs x 4, complicated by repeat tears, infection,  placement of anchor material.        ---    Elevated liver function tests.        ---    Diabetes.        ---      Surgical History    ---      Right rotator cuff repair, with infected hardware that had to be removed  2006    ---    Repeat right shoulder surgery, also which became infected. 2007    ---    Left rotator cuff repair 2008    ---    bilateral knee replacements 2009    ---    Left arthroscopic carpal tunnel release 01/2011    ---    ORIF Left 4th metatarsal bone    ---    Left Ulna shortening 1994    ---    Knee replacement 2010    ---    Hand surgery 2013, 2015    ---    remove gallbladder/hernia 1990    ---    Plate left foot 3rd metatarsal 2008    ---      Family History    ---      Mother: deceased 85 yrs, lung cancer, hyperthyroidism, diagnosed with Cancer    ---    Father: deceased 47s yrs, heart attack, diagnosed with Heart Disease    ---    3 brother(s) .    ---    FH of arthritis and scleroderma    Father deceased from MI    Mother deceased age 7 with lung cancer    Brother with scleroderma / copd.    ---      Social History    ---    Tobacco history: Never smoked.    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    Alcohol  Former daily EtOH use in 20s.   Nonsmoker.    Lives with longstanding boyfriend.    ---      Allergies    ---      N.K.D.A.    ---    [name] Verified]      Hospitalization/Major Diagnostic Procedure    ---      as above    ---      Review of Systems    ---     _ORT_  :    Eyes No. Ear, Nose Throat No. Digestion, Stomach, Bowel No. Bladder Problems  No. Bleeding Problems No. Numbness/Tingling No. Anxiety/Depression No.  Fever/Chills/Fatigue No. Chest Pain/Tightness/Palpitations No. Skin Rash No.  Dental Problems No. Joint/Muscle Pain/Cramps Yes. Blackout/Fainting No. Other  No.            Reason for Appointment    ---      1\. Patient relates continued pain in the right second toe with redness and  irritation over the second PIP joint. She is concerned due to diabetes    ---      Vital Signs    ---    Pain scale 2, Ht-in 62, Ht-cm 157.48.      Examination    ---     _GENERAL_ :    1+/4 PT and DP pulses with neurological sensation is intact, negative  monofilament. skin is intact with no fissures,  abscesses or ulcerations. there  is hammering of lesser toes, right worse than left. nails are onychauxic.  there is pain on the dorsal aspect of the 2nd PIP joint and 2nd MTP joint of  the right foot. there is pain on palpation of the PIP joint with mild erythema  and edema of the PIP joint on the second toe right foot. semi rigid dorsal  contracture of the toe.          Assessments    ---    1\. Type 2 diabetes mellitus without complication, without long-term current  use of insulin - E11.9 (Primary)    ---    2\. Hammertoe of second toe of right foot - M20.41    ---    3\. Pain of right foot - M79.671    ---      Treatment    ---       **1\. Others**    Notes: Applied accomodative gel sleeve to the right second toe. Discussed  options for treatment including comfortable supportive shoe gear, cushioning  and budin splints and surgical management with arthrodesis of the second PIP  joint of the right foot with acumed hammertoe pin. Patient will consider the  options and follow up in two months.    ---      Follow Up    ---    2 Months    Electronically signed by [name] , DPM on 03/29/2017 at 08:28 AM EST    Sign off status: Completed        * * [name]    [address]    [place], [place] [address]    Tel: [phone]    Fax: [phone]              * * *          Patient: [name], [name] DOB: 1961/01/21 Progress Note: [name], DPM  03/23/2017    ---    Note generated by eClinicalWorks EMR/PM Software ([email or web address])

## 2020-06-29 NOTE — Progress Notes (Signed)
* * *        [name]**    --- ---    60 Y old Female, DOB: October 09, 1960, External MRN: [id number]    Account Number: [id number]    [address] [id number]    Home: [phone]    Guarantor: [name] Insurance: [employer]P [place]    PCP: [name], MD Referring: Fortunato Curling, MD    Appointment Facility: Rheumatology        * * *    08/04/2016  Progress Notes: [name], MD **CHN#:** [phone]    --- ---    ---        Reason for Appointment    ---      1\. 10WK FU    ---      History of Present Illness    ---     _GENERAL_ :    [name] was last seen 05/05/16 and presents today for a follow up of her  scleroderma and inflammatory arthritis.    Her hands are swelling often, but it seems to go down Wednesday through  Saturday (she does the Enbrel shot on Saturdays). The swelling is a little  better now that the weather in warmer.    She is still taking Prednisone. She does not report any side effects from the  medications she is on.    I, Coletta Memos, acted as Neurosurgeon for [name]. Signature below:    Coletta Memos, Scribe 08/04/16.      Current Medications    ---    Taking     * Baclofen 10 MG Tablet 1/2 tab Orally prn    ---    * Calcium Carbonate-Vitamin D 600-200 MG-UNIT Capsule 1 capsule with a meal Orally Twice a day    ---    * Enbrel SureClick 50 MG/ML Solution Auto-injector inject 50mg  (1 pen) sc Subcutaneous every week, Notes: not yet, still pending    ---    * GlipiZIDE XL 5 MG Tablet Extended Release 24 Hour 1 tablet Orally Once a day    ---    * Glucophage XR 500 mg Tablet Extended Release 24 Hour 4 tablets Orally Once a day    ---    * Hydroxychloroquine Sulfate 200 mg Tablet 1 tablet with food or milk Orally Once a day    ---    * Levothyroxine Sodium 25 MCG Tablet 1 tablet Orally Once a day    ---    * Omeprazole 40 mg Capsule Delayed Release 1 capsule Orally Once a day    ---    * PredniSONE 5 mg Tablet 1 tablet Orally Once a day    ---    * Ranitidine HCl 150 mg Capsule 1  capsule at bedtime Orally Once a day    ---    * Rosuvastatin Calcium 10 MG Tablet 1 tablet Orally Once a day    ---    * Tramadol HCl 50 MG Tablet 1 tablet Orally every 6 hrs    ---    Not-Taking/PRN    * Voltaren 1 % Gel 2 grams Transdermal twice daily, Notes: PRN    ---      Past Medical History    ---       Scleroderma - CREST dx 2007.        ---    IgG4 deficiency s/p IVIG 2010 ----single infusion given preventively after  week of bilateral knee replacements at [facility]  2010.        ---    Fx left wrist in 1994.        ---    Blood clot at LUE in 2006 on a short course of Coumadin.        ---    Bilateral carpal tunnel syndrome s/p Lt carpal tunnel release and steroids  injection right--.        ---    Bone spur left foot.        ---    Scoliosis.        ---    Spinal stenosis s/p steroids injection.        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 Katrinka Blazing 2010--- never infected, but packed with antibiotics with  surgery;.        ---    Right rotator cuff repairs x 4, complicated by repeat tears, infection,  placement of anchor material.        ---    Elevated liver function tests.        ---    Diabetes.        ---      Surgical History    ---      Right rotator cuff repair, with infected hardware that had to be removed  2006    ---    Repeat right shoulder surgery, also which became infected. 2007    ---    Left rotator cuff repair 2008    ---    bilateral knee replacements 2009    ---    Left arthroscopic carpal tunnel release 01/2011    ---    ORIF Left 4th metatarsal bone    ---    Left Ulna shortening 1994    ---    Knee replacement 2010    ---    Hand surgery 2013, 2015    ---    remove gallbladder/hernia 1990    ---    Plate left foot 3rd metatarsal 2008    ---      Family History    ---      Mother: deceased 58 yrs, lung cancer, hyperthyroidism, diagnosed with Cancer    ---    Father: deceased 49s yrs, heart attack, diagnosed with Heart Disease    ---    3  brother(s) .    ---    FH of arthritis and scleroderma    Father deceased from MI    Mother deceased age 61 with lung cancer    Brother with scleroderma / copd.    ---      Social History    ---    Tobacco history: Never smoked.    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    Alcohol  Former daily EtOH use in 20s.   Nonsmoker.    Lives with longstanding boyfriend.    ---      Allergies    ---      N.K.D.A.    ---      Hospitalization/Major Diagnostic Procedure    ---      as above    ---      Review of Systems    ---     _ADULT Rheumatology ROS_ :    Constitutional No Recent weight gain, Fatigue, Chills. Eyes No Pain, Loss of  vision, Itching eyes. HENT No Headache Ringing in ears Loss of hearing.  Respiratory No Shortness of breath,  Cough, Hemoptysis. Gastrointestinal No  Nausea Heartburn Diarrhea. Genitourinary No Difficult urination, Pus in urine,  Blood in urine. Musculoskeletal **+joint pain/ swelling** , **+Joint pain/  Swelling** . Integumentary (skin and/or breast) No Easy bruising, Rash, Sun  sensitive (sun allergy). Neurological System No, Headaches, Dizziness,  Fainting. Psychiatric No Excessive worries, Depression, Night sweats.  Endocrine No Excessive thirst. Hematologic/Lymphatic No Swollen glands,  Anemia, Lymphadenopathy. Allergic/Immunologic No Frequent sneezing, Increased  susceptibility to infection, Raynauds. Heart No Chest Pain, Heart murmur,  Irregular heart beat.          Vital Signs    ---    Pain scale 4.5, Ht-in 62, Wt-lbs 211, BMI 38.59, BP 146/87, HR 100.      Examination    ---     _RAPID 3_ :    Function (0-10): 0.7.        Pain (0-10): 4.5.        Patient Global (0-10): 0.    _Rheumatology_ :    General Appearance Alert and oriented , No apparent distress .    HENT: No, alopecia .    Eyes: No, scleral icterus, scleral erythema .    CV: regular rate rhythm .    Ext: trace LE edema .    Skin: No, rash .    Psych: No anxiety depression.    Muskuloskeletal Elbows, shoulders, hips,  knees have intact ROM with no  synovitis. Hands have synovitis in the PIPs and MCPs bilaterally..    Skin and Nails Minimal sclerodactyly.          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 Scleroderma - M34.9    ---    3\. 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 Long-term use of high-risk medication - Z79.899    ---      She is still having active synovitis in her hands despite Enbrel therapy for  4 months and prednisone 5 mg. We again reviewed the risk of immunosuppression  in the setting of her IgG4 deficiency. However given her severe synovitis and  how she feels, she accepts the risk. I therefore recommended she try Humira. I  will check monitoring labs today and complete the PA process.    ---      Treatment    ---       **1\. Seronegative rheumatoid arthritis**    Stop Enbrel SureClick Solution Auto-injector, 50 MG/ML, inject 50mg  (1 pen)  sc, Subcutaneous, every week, Notes: not yet, still pending    Start Humira Pen Pen-injector Kit, 40 MG/0.8ML, 0.8 ml, Subcutaneous, every 2  weeks, 28 days, 1 box, Refills 3    _LAB: Alkaline Phosphatase (ALK)_   Alkaline Phosphatase (ALK)  307  H  40 -  130 - IU/L    --- --- --- ---    _LAB: Bilirubin, Total_ Bilirubin, Total  1.1    0.2 - 1.1 - mg/dL    --- --- --- ---    _LAB: Aspartate Aminotranferase (AST/SGOT)_ Aspartate Aminotranferase  (AST/SGOT)  57  H  10 - 42 - IU/L    --- --- --- ---    _LAB: Alanine Aminotransferase (ALT/SGPT)_ Alanine Aminotransferase  (ALT/SGPT)  116  H  0 - 54 - IU/L    --- --- --- ---    _LAB: Blood Urea Nitrogen (BUN)_ Blood Urea Nitrogen  23    6 - 24 -  mg/dL    --- --- --- ---    _LAB: Sedimentation Rate (ESR)_ Sed Rate  39  H  0 - 30 - mm    --- --- --- ---    _LAB: C-Reactive Protein - CRP_ C-Reactive Protein - CRP (Ultra-Wide Range)   4.97    0.00 - 7.48 - mg/L    --- --- --- ---    _LAB: CBC_DIFF_ WBC  8.9    4.0 - 11.0 - K/uL    --- --- --- ---    RBC  4.61    3.70 - 5.00 - M/uL     --- --- --- ---    HGB  13.9    11.0 - 15.0 - g/dL    --- --- --- ---    HCT  42.3    32.0 - 45.0 - %    --- --- --- ---    MCV  91.8    80.0 - 98.0 - fL    --- --- --- ---    MCH  30.2    26.0 - 34.0 - pg    --- --- --- ---    MCHC  32.9    32.0 - 36.0 - g/dL    --- --- --- ---    RDW  14.2    11.5 - 14.5 - %    --- --- --- ---    PLT  254    150 - 400 - K/uL    --- --- --- ---    MPV  10.4    9.1 - 11.7 - fL    --- --- --- ---    SEG NEUT  72     \- %    --- --- --- ---    LYMPH  22     \- %    --- --- --- ---    MONO  5     \- %    --- --- --- ---    EOS  0     \- %    --- --- --- ---    BASO  0     \- %    --- --- --- ---    NEUT #  6.4    1.5 - 7.5 - K/uL    --- --- --- ---    LYMPH #  2.0    1.0 - 4.0 - K/uL    --- --- --- ---    MONO #  0.5    0.2 - 0.8 - K/uL    --- --- --- ---    EOSIN #  0.0    0.0 - 0.5 - K/uL    --- --- --- ---    BASO #  0.0    0.0 - 0.2 - K/uL    --- --- --- ---    Imm Grnas  0     \- %    --- --- --- ---    Imm Grans, Abs  0.0    0.0 - 0.1 - K/uL    --- --- --- ---    _LAB: Creatinine (CR)_ Creatinine (CR)  0.83    0.57 - 1.30 - mg/dL    --- --- --- ---      Follow Up    ---    10 weeks    Electronically signed by Erasmo Leventhal , MD on 08/12/2016 at 08:20 AM EDT    Sign off status: Completed        * * *  Rheumatology    [address]    [place], [place] [address]    Tel: [phone]    Fax: [phone]              * * *          Patient: [name], [name] DOB: July 31, 1960 Progress Note: [name], MD  08/04/2016    ---    Note generated by eClinicalWorks EMR/PM Software ([email or web address])

## 2020-06-29 NOTE — Progress Notes (Signed)
* * *        **  [name]**    --- ---    53 Y old Female, DOB: [birth year]    [address], [place], [place] [address]    Home: [phone]    Provider: [name]        * * *    Telephone Encounter    ---    Answered by   Peter Congo  Date: 04/02/2017         Time: 11:41 AM    Reason   IVIg PA Denied    --- ---            Message                      Hi [name], the IVIg PA was denied. Denial reasoning: "[facility] IVIg policy does not allow coverage of immune globulin products for IgG subclass deficiency if the patient does not have normal toal immune globuline g (IgG) and immunoglobulin M (IgM) levels and normal or low immunoglobulin A (IgA) levels before treatment. In addition, if it is unknown if the patient demostrated an impaired antibody response to vaccination with a pneumococcal polysaccaride vaccine (with a copy of the laboratory report with post-vaccine titers attached)." How would you like to proceed?                 Action Taken                      [name],[name] , [name] 04/02/2017 11:50:43 AM >       [name],[name] , [name] 04/17/2017 10:32:59 AM > Spoke to [place] at [facility], denial was an error and has been overturned, IVIg now approved through 09/30/2017. Updating order and sedning to [facility]. firs      [name],[name] , [name] 04/17/2017 2:22:38 PM > All set, patients first home infusion scheduled for 04/27/2017                    * * *                ---          * * *          Patient: [name], [name] DOB: [birth year] Provider: [name] 04/02/2017    ---    Note generated by eClinicalWorks EMR/PM Software ([email or web address])

## 2020-06-29 NOTE — Progress Notes (Signed)
* * *        **  [name]**    --- ---    39 Y old Female, DOB: 09-Apr-1960    [address] [place], [place] [address]    Home: [phone]    Provider: [name]        * * *    Telephone Encounter    ---    Answered by   Orson Slick  Date: 10/24/2017         Time: 04:35 PM    Reason   [facility] update    --- ---            Message                      Misty Stanley from [facility] is calling for update on [name]. Call back at [phone]                Action Taken                      [name],[name]  10/24/2017 4:35:47 PM > I can also call for you if you jot it down below      [name],[name] , [name] 10/25/2017 8:52:11 AM > LVM for Misty Stanley [facility]), sent letter previously to [facility] as well stating the pt will get IVIG at [facility] for now.                     * * *                ---          * * *          Patient: [name], [name] DOB: 02-11-1961 Provider: [name] 10/24/2017    ---    Note generated by eClinicalWorks EMR/PM Software ([email or web address])

## 2020-06-29 NOTE — Progress Notes (Signed)
* * *        **  [name]**    --- ---    68 Y old Female, DOB: Aug 06, 1960    [address] [place], [place] [address]    Home: [phone]    Provider: [name]        * * *    Telephone Encounter    ---    Answered by   Oletha Blend  Date: 09/25/2016         Time: 03:52 PM    Reason   refill    --- ---            Refills  Refill Hydroxychloroquine Sulfate Tablet, 200 mg, Orally, 30 Tablet,  1 tablet with food or milk, Once a day, 30 days, Refills=2    --- ---          * * *                ---          * * *          PatientSuan [name] DOB: 07-24-1960 Provider: [name] 09/25/2016    ---    Note generated by eClinicalWorks EMR/PM Software ([email or web address])

## 2020-06-29 NOTE — Progress Notes (Signed)
* * *        [name]**    --- ---    75 Y old Female, DOB: [birth year], External MRN: [id number]    Account Number: [id number]    [address], [id number]    Home: [phone]    Guarantor: [name] Insurance: [place]P [place]    PCP: [name], MD Referring: Fortunato Curling, MD    Appointment Facility: Rheumatology, Allergy and Immunology        * * *    10/20/2016  Progress Notes: [name], MD **CHN#:** [id number]    --- ---    ---        Reason for Appointment    ---      1\. scleroderma / inflammatory arthritis FU    ---    2\. Recent shingles    ---      History of Present Illness    ---     _GENERAL_ :    She recently had shingles and still has discomfort behind her right ear.    The pain and swelling in her hands is bothersome but she will tolerate to not  go back on Humira. Except occasional knee discomfort, her achilles, and ankle  joint, no other joints are bothering her.    The Enbrel only helped for a couple of days.    I, Coletta Memos, acted as Neurosurgeon for [name]. Signature below:    Coletta Memos, Scribe 10/20/16.      Current Medications    ---    Taking     * Baclofen 10 MG Tablet 1/2 tab Orally prn    ---    * Calcium Carbonate-Vitamin D 600-200 MG-UNIT Capsule 1 capsule with a meal Orally Twice a day    ---    * GlipiZIDE XL 5 MG Tablet Extended Release 24 Hour 1 tablet Orally Once a day    ---    * Glucophage XR 500 mg Tablet Extended Release 24 Hour 4 tablets Orally Once a day    ---    * Humira Pen 40 MG/0.8ML Pen-injector Kit 0.8 ml Subcutaneous every 2 weeks    ---    * Hydroxychloroquine Sulfate 200 mg Tablet 1 tablet with food or milk Orally Once a day    ---    * Levothyroxine Sodium 25 MCG Tablet 1 tablet Orally Once a day    ---    * Omeprazole 40 mg Capsule Delayed Release 1 capsule Orally Once a day    ---    * PredniSONE 5 mg Tablet 1 tablet Orally Once a day    ---    * Ranitidine HCl 150 mg Capsule 1 capsule at bedtime Orally Once a day    ---    *  Rosuvastatin Calcium 10 MG Tablet 1 tablet Orally Once a day    ---    * Tramadol HCl 50 MG Tablet 1 tablet Orally every 6 hrs    ---    Not-Taking/PRN    * Voltaren 1 % Gel 2 grams Transdermal twice daily, Notes: PRN    ---      Past Medical History    ---       Scleroderma - CREST dx 2007.        ---    IgG4 deficiency s/p IVIG 2010 ----single infusion given preventively after  week of bilateral knee replacements at [facility] 2010.        ---  Fx left wrist in 1994.        ---    Blood clot at LUE in 2006 on a short course of Coumadin.        ---    Bilateral carpal tunnel syndrome s/p Lt carpal tunnel release and steroids  injection right--.        ---    Bone spur left foot.        ---    Scoliosis.        ---    Spinal stenosis s/p steroids injection.        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 Katrinka Blazing 2010--- never infected, but packed with antibiotics with  surgery;.        ---    Right rotator cuff repairs x 4, complicated by repeat tears, infection,  placement of anchor material.        ---    Elevated liver function tests.        ---    Diabetes.        ---      Social History    ---    Tobacco history: Never smoked.    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    Alcohol  Former daily EtOH use in 20s.   Nonsmoker.    Lives with longstanding boyfriend.    ---      Allergies    ---      N.K.D.A.    ---      Review of Systems    ---     _ADULT Rheumatology ROS_ :    Constitutional No Recent weight gain, Fatigue, Chills. Eyes No Pain, Loss of  vision, Itching eyes. HENT No Headache Ringing in ears Loss of hearing.  Respiratory No Shortness of breath, Cough, Hemoptysis. Gastrointestinal No  Nausea Heartburn Diarrhea. Genitourinary No Difficult urination, Pus in urine,  Blood in urine. Musculoskeletal **+joint pain/ swelling** , **+Joint pain/  Swelling, +AM stiffness lasting 30 min**. Integumentary (skin and/or breast)  No Easy bruising, Rash, Sun  sensitive (sun allergy). Neurological System  **+Numbness or Tingling** . Psychiatric No Excessive worries, Depression,  Night sweats. Endocrine No Excessive thirst. Hematologic/Lymphatic No Swollen  glands, Anemia, Lymphadenopathy. Allergic/Immunologic No Frequent sneezing,  Increased susceptibility to infection, Raynauds. Heart No Chest Pain, Heart  murmur, Irregular heart beat. Cardiovascular System **+Swelling/ whitening of  hands/feet in cold** .          Vital Signs    ---    Pain scale 4, Ht-in 62, Wt-lbs 211, BMI 38.59, BP 141/55, HR 89.      Examination    ---     _RAPID 3_ :    Function (0-10): 0.7.        Pain (0-10): 4.5.        Patient Global (0-10): 0.    _Rheumatology_ :    General Appearance Alert and oriented , No apparent distress .    HENT: No, alopecia .    Eyes: No, scleral icterus, scleral erythema .    CV: regular rate rhythm .    Ext: trace LE edema .    Skin: No, rash .    Psych: No anxiety depression.    Muskuloskeletal Elbows, shoulders, hips, knees have intact ROM with no  synovitis. Hands have synovitis in the PIPs and MCPs bilaterally..    Skin and Nails Minimal sclerodactyly.    synovitis in MCPs and PIPs  bilaterally.          Assessments    ---    1\. Scleroderma - M34.9 (Primary)    ---    2\. 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 Her treatment is complicated by the fact  that she has IgG4 deficiency and elevated LFTs for unclear reasons. She  prefers to remain off biologic therapy for now which is OK in the short term.  One option would be to try IVIG. Another would be to try benlysta. For now she  will continue low dose steroids. Will check labs today.    ---      Treatment    ---       **1\. Scleroderma**    _LAB: Alkaline Phosphatase (ALK)_   Alkaline Phosphatase (ALK)  324  H  40 -  130 - IU/L    --- --- --- ---    _LAB: Bilirubin, Total (TBIL)_ Bilirubin, Total  1.0    0.2 - 1.1 - mg/dL     --- --- --- ---    _LAB: Aspartate aminotransferase (AST)_ Aspartate Aminotranferase (AST/SGOT)   97  H  10 - 42 - IU/L    --- --- --- ---    _LAB: Alanine aminotransferase (ALT)_ Alanine Aminotransferase (ALT/SGPT)   168  H  0 - 54 - IU/L    --- --- --- ---    _LAB: Blood Urea Nitrogen (BUN)_ Blood Urea Nitrogen  19    6 - 24 - mg/dL    --- --- --- ---    _LAB: Antinuclear Antibody-ANA_ ANA Pattern  Centromere  A   \-    --- --- --- ---    ANA Titer  1:2560  A   \-    --- --- --- ---    _LAB: Anti DS DNA (ADNA)_ Anti DS DNA  3.7    0.0 - 25.0 - EU    --- --- --- ---    _LAB: C3 Complement_ C3 Complement  156.1    82.0 - 193.0 - mg/dL    --- --- --- ---    _LAB: C4 Complement_ C4 Complement  15.6    12.0 - 57.0 - mg/dL    --- --- --- ---    _LAB: Ext Nclr Antg(Ena) RNP_ Ext Nclr Antg(Ena) RNP  <1.0    0.0 - 20.0 - EU    --- --- --- ---    _LAB: CBC/DIFF with PLT (CBCWD)_ WBC  12.6  H  4.0 - 11.0 - K/uL    --- --- --- ---    RBC  4.69    3.70 - 5.00 - M/uL    --- --- --- ---    HGB  14.2    11.0 - 15.0 - g/dL    --- --- --- ---    HCT  44.1    32.0 - 45.0 - %    --- --- --- ---    MCV  94.0    80.0 - 98.0 - fL    --- --- --- ---    MCH  30.3    26.0 - 34.0 - pg    --- --- --- ---    MCHC  32.2    32.0 - 36.0 - g/dL    --- --- --- ---    RDW  13.7    11.5 -  14.5 - %    --- --- --- ---    PLT  230    150 - 400 - K/uL    --- --- --- ---    MPV  10.7    9.1 - 11.7 - fL    --- --- --- ---    SEG NEUT  85     \- %    --- --- --- ---    LYMPH  11     \- %    --- --- --- ---    MONO  4     \- %    --- --- --- ---    EOS  0     \- %    --- --- --- ---    BASO  0     \- %    --- --- --- ---    NEUT #  10.6  H  1.5 - 7.5 - K/uL    --- --- --- ---    LYMPH #  1.3    1.0 - 4.0 - K/uL    --- --- --- ---    MONO #  0.5    0.2 - 0.8 - K/uL    --- --- --- ---    EOSIN #  0.0    0.0 - 0.5 - K/uL    --- --- --- ---    BASO #  0.0    0.0 - 0.2 - K/uL    --- --- --- ---    Imm Grnas  0     \- %    --- --- --- ---    Imm Grans, Abs   0.0    0.0 - 0.1 - K/uL    --- --- --- ---    _LAB: Quantiferon TB Gold (QFER)_ Quantiferon  NEGATIVE    Negative -    --- --- --- ---    Quantiferon-Nil  0.03     \- IU/mL    --- --- --- ---    Quantiferon Mitogen-Nil  3.63     \- IU/mL    --- --- --- ---    Quantiferon TB Ag-Nil  0.00     \- IU/mL    --- --- --- ---      Follow Up    ---    3 Months    Electronically signed by Erasmo Leventhal , MD on 11/05/2016 at 10:29 PM EDT    Sign off status: Completed        * * *        Rheumatology, Allergy and Immunology    [address]    [place], [place] [address]    Tel: [phone]    Fax: [phone]              * * *          Patient: [name], [name] DOB: [birth year] Progress Note: [name], MD  10/20/2016    ---    Note generated by eClinicalWorks EMR/PM Software ([email or web address])

## 2020-06-29 NOTE — Progress Notes (Signed)
* * *        [name], [name]**    --- ---    60 Y old Female, DOB: 1961-03-15, External MRN: [id number]    Account Number: [id number]    [address], [place], [place]    Home: [phone]    Guarantor: [name] Insurance: NHP [place] Payer ID: PAPER    PCP: [name], MD Referring: Fortunato Curling, MD External Visit ID:  [id number]    Appointment Facility: [facility]        * * *    01/22/2017  Progress Notes: Victorio Palm, DPM **CHN#:** [id number]    --- ---    ---        Current Medications    ---    Taking     * Baclofen 10 MG Tablet 1/2 tab Orally prn    ---    * Calcium Carbonate-Vitamin D 600-200 MG-UNIT Capsule 1 capsule with a meal Orally Twice a day    ---    * GlipiZIDE XL 5 MG Tablet Extended Release 24 Hour 1 tablet Orally Once a day    ---    * Glucophage XR 500 mg Tablet Extended Release 24 Hour 4 tablets Orally Once a day    ---    * Humira Pen 40 MG/0.8ML Pen-injector Kit 0.8 ml Subcutaneous every 2 weeks    ---    * Hydroxychloroquine Sulfate 200 mg Tablet 1 tablet with food or milk Orally Once a day    ---    * Levothyroxine Sodium 25 MCG Tablet 1 tablet Orally Once a day    ---    * Omeprazole 40 mg Capsule Delayed Release 1 capsule Orally Once a day    ---    * PredniSONE 5 mg Tablet 2 tablet Orally Once a day    ---    * Ranitidine HCl 150 mg Capsule 1 capsule at bedtime Orally Once a day    ---    * Rosuvastatin Calcium 10 MG Tablet 1 tablet Orally Once a day    ---    * Tramadol HCl 50 MG Tablet 1 tablet Orally every 6 hrs    ---    Not-Taking/PRN    * Voltaren 1 % Gel 2 grams Transdermal twice daily, Notes: PRN    ---    * Medication List reviewed and reconciled with the patient    ---      Past Medical History    ---       Scleroderma - CREST dx 2007.        ---    IgG4 deficiency s/p IVIG 2010 ----single infusion given preventively after  week of bilateral knee replacements at [facility] 2010.        ---    Fx left wrist in 1994.        ---    Blood clot at LUE in 2006 on a short  course of Coumadin.        ---    Bilateral carpal tunnel syndrome s/p Lt carpal tunnel release and steroids  injection right--.        ---    Bone spur left foot.        ---    Scoliosis.        ---    Spinal stenosis s/p steroids injection.        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 Katrinka Blazing 2010--- never infected, but packed with antibiotics with  surgery;.        ---    Right rotator cuff repairs x 4, complicated by repeat tears, infection,  placement of anchor material.        ---    Elevated liver function tests.        ---    Diabetes.        ---      Surgical History    ---      Right rotator cuff repair, with infected hardware that had to be removed  2006    ---    Repeat right shoulder surgery, also which became infected. 2007    ---    Left rotator cuff repair 2008    ---    bilateral knee replacements 2009    ---    Left arthroscopic carpal tunnel release 01/2011    ---    ORIF Left 4th metatarsal bone    ---    Left Ulna shortening 1994    ---    Knee replacement 2010    ---    Hand surgery 2013, 2015    ---    remove gallbladder/hernia 1990    ---    Plate left foot 3rd metatarsal 2008    ---      Family History    ---      Mother: deceased 15 yrs, lung cancer, hyperthyroidism, diagnosed with Cancer    ---    Father: deceased 59s yrs, heart attack, diagnosed with Heart Disease    ---    3 brother(s) .    ---    FH of arthritis and scleroderma    Father deceased from MI    Mother deceased age 46 with lung cancer    Brother with scleroderma / copd.    ---      Social History    ---    Tobacco history: Never smoked.    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    Alcohol  Former daily EtOH use in 20s.   Nonsmoker.    Lives with longstanding boyfriend.    ---      Allergies    ---      N.K.D.A.    ---    [name]]      Hospitalization/Major Diagnostic Procedure    ---      as above    ---      Review of Systems    ---     _ORT_ :     Eyes No. Ear, Nose Throat No. Digestion, Stomach, Bowel No. Bladder Problems  No. Bleeding Problems No. Numbness/Tingling No. Anxiety/Depression No.  Fever/Chills/Fatigue No. Chest Pain/Tightness/Palpitations No. Skin Rash No.  Dental Problems No. Joint/Muscle Pain/Cramps Yes. Blackout/Fainting No. Other  No.            Reason for Appointment    ---      1\. Patient presents today for diabetic foot and nail care. She relates pain  in her second left toe that started 6 weeks ago. She has been applying band  aids and gauze to her toe for relief of pain. She relates pain while wearing  her shoes yesterday    ---      Vital Signs    ---    Pain scale 2, Ht-in 62, Ht-cm 157.48.      Examination    ---     _GENERAL_ :  1+/4 PT and DP pulses with neurological sensation is intact, negative  monofilament. skin is intact with no fissures, abscesses or ulcerations. there  is hammering of lesser toes, left worse than right. nails are onychauxic.  there is pain on the dorsal aspect of the 2nd PIP joint and 2nd MTP joint of  the left foot. there is pain on palpation at the base of the proximal phalanx  consistent with a plantar plate injury and capsulitis of joint.          Assessments    ---    1\. Capsulitis of toe of left foot - M77.52 (Primary)    ---    2\. Metatarsalgia, left foot - M77.42    ---    3\. Hammertoe of second toe of left foot - M20.42    ---      Treatment    ---       **1\. Others**    Start Sulindac Tablet, 150 mg, 1 tablet with food, Orally, Twice a day, 21  day(s), 42, Refills 0    Notes: Examined left foot. Discussed with patient option of surgery of plantar  plate to straighten left second toe. Applied a budin splint to the left second  toe and instructed her to keep it on for the next 4-6 weeks. Instructed  patient to obtain an MRI if there is no relief of pain. Provided her with a  electronic prescription sent to [facility] in [place], [place] for Clinoril 150mg  to  be taken BID for the next 21 days to  reduce the inflammation. Instructed her  on proper diabetic care of her feet and to wear shoe gear with proper  cushioning. She will follow-up in 2 months.    ---      Follow Up    ---    2 Months    Electronically signed by Victorio Palm , DPM on 01/26/2017 at 03:10 PM EDT    Sign off status: Completed        * * [facility]    [address]    [place], [place] [address]    Tel: [phone]    Fax: [phone]              * * *          Patient: [name], [name] DOB: Sep 05, 1960 Progress Note: Victorio Palm, DPM  01/22/2017    ---    Note generated by eClinicalWorks EMR/PM Software ([email or web address])

## 2020-06-29 NOTE — Progress Notes (Signed)
* * *        **  [name]**    --- ---    29 Y old Female, DOB: 1960-10-18    [address] [place], [place] [address]    Home: [phone]    Provider: Jolaine Artist        * * *    Telephone Encounter    ---    Answered by   Maurice March  Date: 08/11/2015         Time: 03:17 PM    Reason   FU    --- ---            Message                      Pt needs a 3 mons fu appt. She prefers  afternoon. Please call [phone].                Action Taken   Castillo,Angerly 08/11/2015 3:17:50 PM > Bastien,[name] 08/19/2015  4:12:34 PM > Scheduled Mon., 8/28 at 1:00. Left message.                * * *                ---          * * *          Patient: [name], [name] DOB: 1960-07-06 Provider: Jolaine Artist  08/11/2015    ---    Note generated by eClinicalWorks EMR/PM Software ([email or web address])

## 2020-06-29 NOTE — Progress Notes (Signed)
* * *        **  [name]**    --- ---    52 Y old Female, DOB: [birth year]    [address] [place], [place] [address]    Home: [phone]    Provider: [name]        * * *    Telephone Encounter    ---    Answered by   Cline Crock  Date: 09/27/2015         Time: 11:40 AM    Message                      Patient was booked with Dr. Margret Chance in the UC clinic on Monday but he doesnt start until 07/05. So [name] will see this patient at 10:00 on Monday. I did leave a patient a vm letting her know.        --- ---            Action Taken   Sateriale,Rachel 09/27/2015 11:41:16 AM >                * * *                ---          * * *          Patient: [name], [name] DOB: 1960-09-17 Provider: [name] 09/27/2015    ---    Note generated by eClinicalWorks EMR/PM Software ([email or web address])

## 2020-06-29 NOTE — Progress Notes (Signed)
* * *        **  [name]**    --- ---    77 Y old Female, DOB: Aug 12, 1960    [address], [place], [place] [address]    Home: [phone]    Provider: [name]        * * *    Telephone Encounter    ---    Answered by   Cindie Crumbly  Date: 11/29/2017         Time: 03:11 PM    Caller   Collette    --- ---            Reason   [facility] Call back            Message                      Good afternoon            Collette is calling on the behalf of the patient. Collette wants to know if Dr [name] can resend the IVIG orders so the patient can be seen at [facility] instead of [facility] so the patient don't have to pay out of pocket 500 dollars. Please call Collrtte at [phone] to assist.            Thank You                Action Taken                      Cindie Crumbly  11/29/2017 3:16:18 PM >       [name],[name] , [name] 11/29/2017 3:38:50 PM > Spoke to [facility]) at [facility] who states the IVIG shortage is over so they can transfer the patient back to home infusion. The patients infusion is tomorrow so I will send the order today. Canceling patient infusion appt at [facility]. Patient notified and she states she has been in contact with [facility] to have the infusion on Monday.                     * * *                ---          * * *          Patient: Kopf, Neriah DOB: [birth year] Provider: [name] 11/29/2017    ---    Note generated by eClinicalWorks EMR/PM Software ([email or web address])

## 2020-06-29 NOTE — Progress Notes (Signed)
* * *        [name]**    --- ---    60 Y old Female, DOB: November 02, 1960, External MRN: [id number]    Account Number: [id number]    [address] [place]    Home: [phone]    Guarantor: [name] Insurance: [id number] NHP PPO    PCP: [name] Referring: [name]    Appointment Facility: Endocrinology        * * *    10/02/2017   **Appointment Provider:** [name], NP **CHN#:** [id number]    --- ---      **Supervising Provider:** Delora Fuel, MD, PhD    ---         **Reason for Appointment**    ---       1\. FOLLOW UP PER DR MOULI    ---       **History of Present Illness**    ---     _General_ :    60 y/o female with scleroderma (on prednisone), IgG-4 deficiency, NASH and  uncontrolled T2DM. Seen for initial visit on 03/15/16 for management of  hyperglycemia in diabetes. No Endocrine visit since 04/2016. In the interim has  continued to check blood sugars at home, take metformin and now glipizide is  reduced to 2.5mg  as she reports hypoglycemia episodes with the 5mg  dose.    Diabetes Meds:    glipizide 2.5mg     metformin 2000mg  daily    no previous medications or insulin injections    -----    reports hypoglycemia 40-60's range on a higher dose of glipizide-5mg , 10mg .    A1c Trend:    10%-----09/14/17    8.7%-----03/02/16    11%-----08/05/15    7.1%-----11/04/14    7%-----12/31/13    6.6%-----05/06/13.    Recent Blood Glucoses:    no glucometer today    endorses fasting bg 100-160 range    daytime readings 200-300.    Diet:    3 meals daily, low CHO    working on reducing fat/cho portions--seen by Nutrition 04/2016.    Exercise:    walks 1-2 mi daily with dog.    Microvascular Complications Screening:    eyes: no acute vision changes, no hx of retinopathy, reports annual exam with  ophthalmologist is scheduled    kidneys: creat:0.91, egfr 71 (09/14/17), **uACR:137 (06/2017)**    feet: denies numbess or parethesias in feet, seen by Dr. Vassie Loll- hammering of  the lesser toes, given rx for custom  orthotics.    Macrovascular Complications Screening:    no hx of MI, CVA or PVD    (+) hld    Lipids: TChol: 158, TG:110, HDL:67, LDL:69, Chol/HDL ratio: 2.3% (01/2017)    on rosuvastatin.      **Current Medications**    ---    Taking     * Baclofen 10 MG Tablet 1/2 tab Orally prn    ---    * Calcium Carbonate-Vitamin D 600-200 MG-UNIT Capsule 1 capsule with a meal Orally Twice a day    ---    * GlipiZIDE XL 2.5 MG Tablet Extended Release 24 Hour 1 tablet Orally Once a day    ---    * Glucophage XR 500 mg Tablet Extended Release 24 Hour 4 tablets Orally Once a day    ---    * Hydroxychloroquine Sulfate 200 mg Tablet 1 tablet with food or milk Orally Once a day    ---    * Ibuprofen 600 MG  Tablet 1 tablet with food or milk as needed Orally Three times a day    ---    * Levothyroxine Sodium 25 MCG Tablet 1 tablet Orally Once a day    ---    * Omeprazole 40 mg Capsule Delayed Release 1 capsule Orally Once a day    ---    * PredniSONE 10 MG Tablet 1 tablet Orally Once a day    ---    * Ranitidine HCl 150 mg Capsule 1 capsule at bedtime Orally Once a day    ---    * Rosuvastatin Calcium 10 MG Tablet 1 tablet Orally Once a day    ---    * Sulindac 150 mg Tablet 1 tablet with food Orally Twice a day    ---    * Tramadol HCl 50 MG Tablet 1 tablet Orally every 6 hrs    ---    Not-Taking/PRN    * Humira Pen 40 MG/0.8ML Pen-injector Kit 0.8 ml Subcutaneous every 2 weeks    ---    * Voltaren 1 % Gel 2 grams Transdermal twice daily, Notes: PRN    ---    Unknown    * Oxycodone HCl 5 mg Tablet 1 tablet Orally as needed for pain    ---    * Medication List reviewed and reconciled with the patient    ---       **Past Medical History**    ---       Scleroderma - CREST dx 2007.        ---    IgG4 deficiency s/p IVIG 2010 ----single infusion given preventively after  week of bilateral knee replacements at [facility] 2010.        ---    Fx left wrist in 1994.        ---    Blood clot at LUE in 2006 on a short course of Coumadin.        ---     Bilateral carpal tunnel syndrome s/p Lt carpal tunnel release and steroids  injection right--.        ---    Bone spur left foot.        ---    Scoliosis.        ---    Spinal stenosis s/p steroids injection.        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 Katrinka Blazing 2010--- never infected, but packed with antibiotics with  surgery;.        ---    Right rotator cuff repairs x 4, complicated by repeat tears, infection,  placement of anchor material.        ---    Elevated liver function tests.        ---    Diabetes.        ---       **Surgical History**    ---       Right rotator cuff repair, with infected hardware that had to be removed  2006    ---    Repeat right shoulder surgery, also which became infected. 2007    ---    Left rotator cuff repair 2008    ---    bilateral knee replacements 2009    ---    Left arthroscopic carpal tunnel release 01/2011    ---    ORIF Left 4th metatarsal bone    ---    Left Ulna  shortening 1994    ---    Knee replacement 2010    ---    Hand surgery 2013, 2015    ---    remove gallbladder/hernia 1990    ---    Plate left foot 3rd metatarsal 2008    ---       **Family History**    ---       Mother: deceased 67 yrs, lung cancer, hyperthyroidism, diagnosed with  Cancer    ---    Father: deceased 34s yrs, heart attack, Heart Disease    ---    3 brother(s) .    ---    FH of arthritis and scleroderma\nFather deceased from MI\nMother deceased age  33 with lung cancer \\nBrother  with scleroderma \/ copd.    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    Alcohol    _Former daily EtOH use in 20s_    Tobacco history: Never smoked.   Nonsmoker.    Lives with longstanding boyfriend.    ---       **Hospitalization/Major Diagnostic Procedure**    ---       as above    ---      **Review of Systems**    ---     _Diabetes_ :    General: no unintended weight change, no fevers, malaise, weakness. Eyes:  no  vision loss. Cardiovascular: no CP, no orthopnea, no palpitations.  Respiratory: no wheezing, no dyspnea. Gastrointestinal: no n/v, no abdominal  pain. Musculoskeletal: no edema. Skin: no rash. Neurological: no numbness.          **Vital Signs**    ---    Pain scale 5, Ht-in 62, Wt-lbs 180, BMI 32.92, BP 120/80, HR 80, BSA 1.89, Ht-  cm 157.48, Wt-kg 81.65, Wt Change -2 lb.       **Physical Examination**    ---     _Endo: Diabetes Follow-Up_ :    Heart/CV: regular, no murmur.    Extremities: No LE edema, feet without cuts or ulcers.    General alert and oriented x3, NAD.    counseled for 20 minutes of this 25 minute visit.       **Assessments**    ---    1\. Type 2 diabetes mellitus with hyperglycemia, without long-term current use  of insulin - E11.65 (Primary)    ---    2\. Other hyperlipidemia - E78.4    ---    3\. Obesity (BMI 30-39.9) - E66.9    ---      Ms. Pleitez is a 60 y/o female with obesity, hypothyroidism, scleroderma, hld  and uncontrolled T2DM. Returns for follow up after an absence from care since  04/2016. Recent hgba1c was 10% (09/14/17). Aden does not have her glucometer  but reports fasting blood sugars between 100-160 range with daytime readings  rising into the 200-300 range. She also remains on 10mg  of prednisone daily. I  have reviewed lifestyle measures and answered questions related to diet. In  terms of ongoing treatment I reviewed options with her, she is not interested  in insulin therapy and we will start with the addition of a daily SGLT2i. I  will inquire with her insurance to see what is covered. Recommended close f/u  to monitor progress. I also discussed blood glucose goals, aim for bg readings  <180 with an initial goal of getting the hgb a1c under 8%. She will try to  check multiple blood sugars daily and bring her glucometer to her follow up  visit. May consider utilizing a professional cgm for 2 weeks if I am lacking  blood glucose data at f/u. If limited glycemic improvement  with added oral  therapy then I would recommend starting basal insulin.    Weight is down from prior visit. BMI currently 32. States she has lost 10lbs  in recent months. No sxs of polyuria, polydipisa or unintentional weight loss  to suggest this is related to blood sugars.    Back on high potency statin, rosuvastatin. Annual lipids at goal.    ---       **Treatment**    ---       **1\. Type 2 diabetes mellitus with hyperglycemia, without long-term  current use of insulin**    Continue GlipiZIDE XL Tablet Extended Release 24 Hour, 2.5 MG, 1 tablet,  Orally, Once a day    Continue Glucophage XR Tablet Extended Release 24 Hour, 500 mg, 4 tablets,  Orally, Once a day    Start Jardiance Tablet, 10 MG, 1 tablet, Orally, Once a day, 90 days, 90,  Refills 3    ---         **2\. Other hyperlipidemia**    Continue Rosuvastatin Calcium Tablet, 10 MG, 1 tablet, Orally, Once a day         **3\. Others**    Notes: 1) Please remember to do your repeat blood tests at least one week  prior to your next scheduled follow up visit with [facility]. If blood  tests are not done in advance of the appointment or if an appointment is  canceled/missed, you will be notified of test results via mail. 2) Please  check your medications and notify us as soon as possible if you need refills,  with at least one week's notice. We cannot guarantee that refill requests can  be processed in a timely fashion sooner than this. , 3) Please remember to  bring your blood sugar meter and/or logs to every office visit., 4) Please  allow at least 24 hours for any required return phone call. I am in clinic or  available by phone Tuesday-Friday. I am out of the clinic on Mondays. If your  issue or concern is urgent you may ask to speak to the doctor on call if I am  not available.      **Follow Up**    ---    2 mos    **Appointment Provider:** [name], NP    Electronically signed by Broadus John , MD, PhDx on 10/03/2017 at 10:24 AM  EDT    Sign off  status: Completed        * * *        Endocrinology    [address]    [place], [address]    [place], [place] [address]    Tel: [phone]    Fax: [phone]              * * *          Patient: [name], [name] DOB: 1961-03-26 Progress Note: [name], NP  10/02/2017    ---    Note generated by eClinicalWorks EMR/PM Software ([email or web address])

## 2020-06-29 NOTE — Progress Notes (Signed)
* * *        **  [name]**    --- ---    46 Y old Female, DOB: [birth year]    [address] [place], [place] [address]    Home: [phone]    Provider: [name]        * * *    Telephone Encounter    ---    Answered by   Orson Slick  Date: 03/22/2018         Time: 11:29 AM    Reason   Dental work letter    --- ---            Message                      Dr. Olena Heckle periodontist called regarding dental implants/ Cell [phone]. Office: [phone]. Office fax: [phone]                Action Taken                      [name],[name]  03/22/2018 11:32:58 AM > States fine with pre medication, will rx percocet/NSAID. any personal precautions. perdnisone/healng, ok to move forward w/ surgery and implants. No hx w/ these patients in the past.       [name],[name]  03/22/2018 12:00:17 PM > cant take nsaids, poor heeling and infection, but we will monitor      [name],[name]  03/25/2018 9:38:35 AM > Letter typed and faxed to Dr. Dianna Limbo at [phone]                    * * *                ---          * * *          Patient: [name] DOB: 02/13/1961 Provider: [name] 03/22/2018    ---    Note generated by eClinicalWorks EMR/PM Software ([email or web address])

## 2020-06-29 NOTE — Progress Notes (Signed)
* * *        [name]**    --- ---    60 Y old Female, DOB: 08-01-1960, External MRN: [id number]    Account Number: [id number]    [address] [name], [place]    Home: [phone]    Guarantor: [name] Insurance: NHP [place]    PCP: [name], MD Referring: Fortunato Curling, MD    Appointment Facility: [facility]        * * *    04/21/2016   **Appointment Provider:** Crist Infante **CHN#:** [id number]    --- ---      **Supervising Provider:** Yetta Numbers, MD    ---        Reason for Appointment    ---      1\. POST OP LT RTSA 03/07/16    ---      History of Present Illness    ---     _GENERAL_ :    [name] is doing very well from her L RTSA. Very pleased with pain relief and  motion. No complaints. Attending PT with steady progress.      Current Medications    ---    Taking     * Baclofen 10 MG Tablet 1/2 tab Orally prn    ---    * Calcium Carbonate-Vitamin D 600-200 MG-UNIT Capsule 1 capsule with a meal Orally Twice a day    ---    * Enbrel SureClick 50 MG/ML Solution Auto-injector inject 50mg  (1 pen) sc Subcutaneous every week, Notes: not yet, still pending    ---    * GlipiZIDE XL 5 MG Tablet Extended Release 24 Hour 1 tablet Orally Once a day    ---    * Glucophage XR 500 mg Tablet Extended Release 24 Hour 4 tablets Orally Once a day    ---    * Hydroxychloroquine Sulfate 200 mg Tablet 1 tablet with food or milk Orally Once a day    ---    * Levothyroxine Sodium 25 MCG Tablet 1 tablet Orally Once a day    ---    * Pantoprazole Sodium 40 mg Tablet Delayed Release take 1 tablet Orally Once a day    ---    * PredniSONE 5 mg Tablet 2 tablet Orally Once a day    ---    * Rosuvastatin Calcium 10 MG Tablet 1 tablet Orally Once a day    ---    * Tramadol HCl 50 MG Tablet 1 tablet Orally every 6 hrs    ---    Not-Taking/PRN    * Voltaren 1 % Gel 2 grams Transdermal twice daily, Notes: PRN    ---      Past Medical History    ---       Scleroderma - CREST dx 2007.        ---     IgG4 deficiency s/p IVIG 2010 ----single infusion given preventively after  week of bilateral knee replacements at [facility] 2010.        ---    Fx left wrist in 1994.        ---    Blood clot at LUE in 2006 on a short course of Coumadin.        ---    Bilateral carpal tunnel syndrome s/p Lt carpal tunnel release and steroids  injection right--.        ---    Bone spur left foot.        ---  Scoliosis.        ---    Spinal stenosis s/p steroids injection.        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 Katrinka Blazing 2010--- never infected, but packed with antibiotics with  surgery;.        ---    Right rotator cuff repairs x 4, complicated by repeat tears, infection,  placement of anchor material.        ---    Elevated liver function tests.        ---    Diabetes.        ---      Surgical History    ---      Right rotator cuff repair, with infected hardware that had to be removed  2006    ---    Repeat right shoulder surgery, also which became infected. 2007    ---    Left rotator cuff repair 2008    ---    bilateral knee replacements 2009    ---    Left arthroscopic carpal tunnel release 01/2011    ---    ORIF Left 4th metatarsal bone    ---    Left Ulna shortening 1994    ---    Knee replacement 2010    ---    Hand surgery 2013, 2015    ---    remove gallbladder/hernia 1990    ---    Plate left foot 3rd metatarsal 2008    ---      Family History    ---      Mother: deceased 29 yrs, lung cancer, hyperthyroidism, diagnosed with Cancer    ---    Father: deceased 26s yrs, heart attack, diagnosed with Heart Disease    ---    3 brother(s) .    ---    FH of arthritis and scleroderma    Father deceased from MI    Mother deceased age 18 with lung cancer    Brother with scleroderma / copd.    ---      Social History    ---    Tobacco history: Never smoked.    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    Alcohol    _Former daily EtOH use in 20s_   Nonsmoker.    Lives with  longstanding boyfriend.    ---      Allergies    ---      N.K.D.A.    ---      Hospitalization/Major Diagnostic Procedure    ---      as above    ---      Review of Systems    ---     _ORT_ :    Eyes No. Ear, Nose Throat No. Digestion, Stomach, Bowel No. Bladder Problems  No. Bleeding Problems No. Numbness/Tingling No. Anxiety/Depression No.  Fever/Chills/Fatigue No. Chest Pain/Tightness/Palpitations No. Skin Rash No.  Dental Problems No. Joint/Muscle Pain/Cramps Yes. Blackout/Fainting No. Other  No.          Vital Signs    ---    Pain scale 3, Ht-in 62.      Examination    ---     _GENERAL_ :    LUE: incision well healed, FF 150, AB 120, ER 35, good strength, nl sensation  ax/med/rad/ulnar nerves, palp rad pulse    Xr: no fx, components in good alignment.  Assessments    ---    1\. 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 Others**    Notes: [name] is recovering well. She will return to work 2/2. Note given. She  is to begin strengthening. She will avoid resisted IR for another 6 weeks. She  will return in 4.5 months with repeat xr.    ---      Follow Up    ---    18 weeks    **Appointment Provider:** [facility]    Electronically signed by Yetta Numbers , MD on 05/02/2016 at 09:46 PM EST    Sign off status: Completed        * * *        [facility]    [address]    [place], [place] [address]    Tel: [phone]    Fax: [phone]              * * *          Patient: [name], [name] DOB: 31-May-1960 Progress Note: [name]  04/21/2016    ---    Note generated by eClinicalWorks EMR/PM Software ([email or web address])

## 2020-07-09 ENCOUNTER — Encounter (HOSPITAL_BASED_OUTPATIENT_CLINIC_OR_DEPARTMENT_OTHER)

## 2020-07-16 ENCOUNTER — Other Ambulatory Visit

## 2020-07-16 ENCOUNTER — Ambulatory Visit
Admit: 2020-07-16 | Discharge: 2020-07-16 | Payer: PRIVATE HEALTH INSURANCE | Attending: Rheumatology | Primary: Internal Medicine

## 2020-07-16 ENCOUNTER — Encounter (HOSPITAL_BASED_OUTPATIENT_CLINIC_OR_DEPARTMENT_OTHER): Admitting: Rheumatology

## 2020-07-16 ENCOUNTER — Encounter

## 2020-07-16 VITALS — BP 105/69 | HR 95 | Ht 63.0 in | Wt 142.0 lb

## 2020-07-16 DIAGNOSIS — M349 Systemic sclerosis, unspecified: Secondary | ICD-10-CM

## 2020-07-16 NOTE — Progress Notes (Signed)
 [facility]  [facility]  [address]  [place] [address]  [place] [address]  Dept: [phone]  Dept Fax: [phone]     Patient ID: [name] is a 60 y.o. female who presents for Follow-up.    Assessment/Plan   [name] was seen today for follow-up.  Systemic sclerosis, unspecified (CMS/HCC)  Assessment & Plan:  Her Raynaud's and skin disease are stable.  She continues to have esophageal reflux despite proton pump inhibitor therapy.  She is followed by GI however she is not getting any advice on this from them.  I will contact the gastroenterologist office to ask for the recommendations.  She also has complications of recurrent diarrhea which could be a result of toxicity from mycophenolate however it could also be related to bacterial overgrowth as a complication of scleroderma.  I will asked the gastroenterologist about this as well.  Seronegative rheumatoid arthritis (CMS/HCC)  Assessment & Plan:  Her inflammatory hand arthritis is significantly troubling to her.  She had gotten some benefit for mycophenolate however this is not completely resolved her symptoms.  She has taken several courses of low-dose prednisone to relieve this swelling and pain.  I am concerned with placing her on chronic corticosteroid therapy due to numerous complications that she is already had from long-term steroid use.  I would like to increase mycophenolate however as this may be a cause of her chronic diarrhea I would like to get some input from gastroenterology first.  We may also try to switch her from mycophenolate to the enteric-coated version.  IgG4 deficiency (CMS/HCC)  Assessment & Plan:  This is stable but significantly complicates the treatment of her autoimmune disease.  She is receiving monthly infusions of IVIG and I recommend she continue to do that while she is on immunosuppressants.  Elevated liver enzymes  Left hip pain  Long-term use of high-risk  medication  Assessment & Plan:  She is due for monitoring labs however she has these drawn during her infusion of IVIG therefore I will await those results.  Her infusion scheduled for next week.      Subjective   HPI   Getting better. Has good days and bad days with back, was able to walk here from the garage with no assistance. Hands are really bad and has trouble straightening them. Today is a good day. Usually MCPs are swollen. Both 2nd fingers hurt from MCP to PIP. R thumb gets a trigger finger. Wake up with hands stuck in a position. Right wrist is very painful. Both big toe joints are painful and swollen. Left hip pain, wears lidocaine patches. Last visit was put on a course of prednisone for flare. Takes prednisone when she has flares which brings swelling down to being manageable. Only takes for short periods of time. Still takes cellcept and on empty stomach. Gets sick every 8-10 days with upset stomach, diarrhea. GI has been following this and are concerned about liver counts and want to do biopsy. Wakes up about once a week with acid in her throat and has to spit it out.  Had mouth surgery a few days ago. Takes oxycodone only 3x a week.           Objective    Visit Vitals  BP 105/69   Pulse 95   Ht 1.6 m   Wt 64.4 kg   SpO2 96%   BMI 25.15 kg/m?   BSA 1.69 m?  Physical Exam  General:  No acute distress  HEENT:  Moist mucous Membranes  Eyes:  No scleral icterus  Skin:  No rash.   minimal sclerodactyly, no cracking at the  fingertips, no ulcers.  MSK:  Left hip troch tenderness.  Hips have full range of motion bilaterally.  Knees have trace effusions bilaterally with well-healed scars.  Hands have mild chronic deformities at the DIPs PIPs and MCPs.  There is a small amount of synovitis in these joints as well.  The amount of synovitis does seem improved from prior visits.

## 2020-07-18 NOTE — Assessment & Plan Note (Signed)
 Her inflammatory hand arthritis is significantly troubling to her.  She had gotten some benefit for mycophenolate however this is not completely resolved her symptoms.  She has taken several courses of low-dose prednisone to relieve this swelling and pain.  I am concerned with placing her on chronic corticosteroid therapy due to numerous complications that she is already had from long-term steroid use.  I would like to increase mycophenolate however as this may be a cause of her chronic diarrhea I would like to get some input from gastroenterology first.  We may also try to switch her from mycophenolate to the enteric-coated version.

## 2020-07-18 NOTE — Assessment & Plan Note (Signed)
 This is stable but significantly complicates the treatment of her autoimmune disease.  She is receiving monthly infusions of IVIG and I recommend she continue to do that while she is on immunosuppressants.

## 2020-07-18 NOTE — Assessment & Plan Note (Signed)
 Her Raynaud's and skin disease are stable.  She continues to have esophageal reflux despite proton pump inhibitor therapy.  She is followed by GI however she is not getting any advice on this from them.  I will contact the gastroenterologist office to ask for the recommendations.  She also has complications of recurrent diarrhea which could be a result of toxicity from mycophenolate however it could also be related to bacterial overgrowth as a complication of scleroderma.  I will asked the gastroenterologist about this as well.

## 2020-07-18 NOTE — Assessment & Plan Note (Signed)
 She is due for monitoring labs however she has these drawn during her infusion of IVIG therefore I will await those results.  Her infusion scheduled for next week.

## 2020-07-19 ENCOUNTER — Encounter (HOSPITAL_BASED_OUTPATIENT_CLINIC_OR_DEPARTMENT_OTHER)

## 2020-08-02 ENCOUNTER — Other Ambulatory Visit (HOSPITAL_BASED_OUTPATIENT_CLINIC_OR_DEPARTMENT_OTHER): Admitting: Internal Medicine

## 2020-08-02 MED ORDER — oxyCODONE (Roxicodone) 10 mg immediate release tablet
10 | ORAL_TABLET | Freq: Two times a day (BID) | ORAL | 0 refills | 8.00000 days | Status: DC
Start: 2020-08-02 — End: 2020-09-15

## 2020-08-02 NOTE — Telephone Encounter (Signed)
It looks like per last PCP note she is on oxycodone 10mg  BID- 56tabs    "Oxycodone 10 mg BID, Pregabalin 50 mg BID, and baclofen as needed.  Will make no changes this visit.  Have signed a opioid contract with her as will need this more chronically."      Will remove the 5mg  from med list    Pended for pcp preceptor to sign

## 2020-08-02 NOTE — Telephone Encounter (Signed)
[facility] checked.   Last filled: 3/22 for 56 tabs 28 days   Next refill due: today    Dr. Dorene Grebe,  Please confirm amount for refill, PMP shows varying amounts for different amount of days.     Rx queued for PCP signature  Delma Freeze, [place]

## 2020-08-02 NOTE — Telephone Encounter (Signed)
Pt is calling in regards to her medication Oxycodone. On her medication Chart is shows that pt takes Oxycodone 5 mg but she takes Oxycodone 10 mg. Please clarify the list for the correct Dosage amount.    Please send Prescription to: [address] [place], [place] [address] and Pharmacy number is [phone].     Thank you so much.

## 2020-08-03 ENCOUNTER — Encounter

## 2020-08-03 ENCOUNTER — Other Ambulatory Visit

## 2020-08-03 NOTE — Progress Notes (Signed)
RXSP MEDICATION ACCESS - PHARMACY BENEFIT      RX PA approved for Oxycodone 10mg  per [facility] [phone]    Plan type: Commercial    PA effective from 08/03/20 to 01/30/21  PA #: [id number]    Signed  Carmelina Peal, [name]  Specialty Pharmacist

## 2020-08-10 ENCOUNTER — Telehealth (HOSPITAL_BASED_OUTPATIENT_CLINIC_OR_DEPARTMENT_OTHER): Admitting: Student in an Organized Health Care Education/Training Program

## 2020-08-10 NOTE — Telephone Encounter (Addendum)
She is calling to let us know that her hands are swollen and painful.  She feels she is losing dexterity.  She was taking 10 mg prednisone but ran out.    Her low back and legs are painful again.  She cannot get in touch with the surgeon and has asked me to check if [name] has any recommendations.     Appt mid June with  [place].

## 2020-08-17 MED ORDER — predniSONE (Deltasone) 10 mg tablet
10 | ORAL_TABLET | Freq: Every day | ORAL | 1 refills | Status: DC
Start: 2020-08-17 — End: 2020-09-03

## 2020-08-17 NOTE — Addendum Note (Signed)
Addended by: Dagoberto Reef on: 08/17/2020 03:19 PM     Modules accepted: Orders

## 2020-08-19 ENCOUNTER — Encounter

## 2020-08-19 ENCOUNTER — Telehealth (HOSPITAL_BASED_OUTPATIENT_CLINIC_OR_DEPARTMENT_OTHER): Admitting: Physician Assistant

## 2020-08-19 MED ORDER — predniSONE (Deltasone) 10 mg tablet
10 | ORAL_TABLET | Freq: Every day | ORAL | 1 refills | Status: DC
Start: 2020-08-19 — End: 2020-09-03

## 2020-08-19 NOTE — Telephone Encounter (Signed)
Sent again with 30 tabs!

## 2020-08-19 NOTE — Telephone Encounter (Signed)
Pt called in. Last fill yesterday was only for 3 tabs total (should have been at least 30).     Last fill of prednisone was 06/12/20 for 10 mg x 3 tabs daily (90 pills).     Last dose was 2 days ago, 20 mg which she took for 3 days total.     [name], please advise on refill dose and resend.

## 2020-09-03 ENCOUNTER — Ambulatory Visit
Admit: 2020-09-03 | Discharge: 2020-09-03 | Payer: PRIVATE HEALTH INSURANCE | Attending: Rheumatology | Primary: Student in an Organized Health Care Education/Training Program

## 2020-09-03 ENCOUNTER — Encounter (HOSPITAL_BASED_OUTPATIENT_CLINIC_OR_DEPARTMENT_OTHER): Admitting: Rheumatology

## 2020-09-03 ENCOUNTER — Other Ambulatory Visit

## 2020-09-03 VITALS — BP 135/85 | HR 97 | Ht 63.0 in | Wt 141.0 lb

## 2020-09-03 DIAGNOSIS — M0600 Rheumatoid arthritis without rheumatoid factor, unspecified site: Secondary | ICD-10-CM

## 2020-09-03 MED ORDER — predniSONE (Deltasone) 10 mg tablet
10 | ORAL_TABLET | Freq: Every day | ORAL | 1 refills | Status: DC
Start: 2020-09-03 — End: 2020-11-15

## 2020-09-03 NOTE — Progress Notes (Signed)
Follow-up Visit  [facility]      Patient: [name] 60 y.o. female 1960-06-11 [id number]  Today: 09/03/20 11:36 AM    HPI  [name] is a(n) 60 y.o. female with h/o scleroderma and seronegative rheumatoid arthritis who presents for follow-up.    Prior visit (05/07/20): 60 y/o female with a history of seronegative RA and scleroderma, here for a follow up. Is 5 weeks post-op for L4-L5 TLIF surgery. Has been feeling a lot better and notes that her feet and legs are no longer swollen or numb. Hands have been feeling better, but still ache, are cold, and can't fully straighten. Still has some pain in her back and right leg which lidocaine patches are helpful for. Still having some hand twitching. Established a pain regimen with PCP: lyrica (2 in morning, 2 at night), no gabapentin, oxycodone, baclofen, trazadone and mycophenolate.    Today (09/03/20): The patient continues to take Plaquenil and CellCept.  She had been on prednisone for more than a decade, stopped in April of this year with instructions to use only for flares, but she developed severe pain swelling and stiffness most prominent in the morning approximately 3 weeks ago.  At this time, Gavin Pound was instructed to take 10 mg of prednisone every day.  This improved her symptoms significantly.  She reports groin/pubic pain that is consistently dull but approximately 1 or 2 times per day becomes severe for less than 1 minute.  She attributes this to a recent diagnosis of Paget's disease of the pubic bone.    Medications    Current Outpatient Medications:   ?  baclofen (Lioresal) 10 mg tablet, Take by mouth., Disp: , Rfl:   ?  gabapentin (Neurontin) 100 mg capsule, TAKE 2 CAPSULES BY MOUTH THREE TIMES DAILY (Patient not taking: Reported on 07/16/2020), Disp: 42 each, Rfl: 0  ?  hydroxychloroquine (Plaquenil) 200 mg tablet, Take by mouth., Disp: , Rfl:   ?  immune globulin, human, (Gammagard) infusion, Receives infusion every 4 weeks, next dose 8/5,  Disp: , Rfl:   ?  Jardiance 10 mg, Take 10 mg by mouth in the morning., Disp: , Rfl:   ?  lisinopril 2.5 mg tablet, Take 1 tablet by mouth in the morning., Disp: , Rfl:   ?  metFORMIN XR (Glucophage-XR) 500 mg 24 hr tablet, Take by mouth., Disp: , Rfl:   ?  mycophenolate (Cellcept) 500 mg tablet, Take 2 tablets by mouth., Disp: , Rfl:   ?  omeprazole (PriLOSEC) 20 mg DR capsule, Take by mouth., Disp: , Rfl:   ?  oxyCODONE (Roxicodone) 10 mg immediate release tablet, Take 1 tablet (10 mg) by mouth in the morning and at bedtime., Disp: 56 tablet, Rfl: 0  ?  predniSONE (Deltasone) 10 mg tablet, Take 1 tablet (10 mg) by mouth in the morning., Disp: 3 tablet, Rfl: 1  ?  predniSONE (Deltasone) 10 mg tablet, Take 1 tablet (10 mg) by mouth in the morning., Disp: 30 tablet, Rfl: 1  ?  pregabalin (Lyrica) 25 mg capsule, TAKE 1 CAPSULE BY MOUTH THREE TIMES DAILY, Disp: 21 each, Rfl: 0  ?  TRAZODONE HCL ER PO, SIG:, Disp: , Rfl:   ?  ursodiol (Actigall) 500 mg tablet, Take 1 tablet by mouth., Disp: , Rfl:     Other patient hx  Medical hx  Scleroderma - CREST dx 2007  IgG4 deficiency s/p IVIG 2010 ----single infusion given  preventively after week of bilateral knee replacements at  [facility]  2010  Fx left wrist in 1994  Blood clot at LUE in 2006 on a short course of Coumadin  Bilateral carpal tunnel syndrome s/p Lt carpal tunnel release and  steroids injection right--  Bone spur left foot  Scoliosis  Spinal stenosis s/p steroids injection  s/p bilateral knee replacements for valgus /arthritic  complications, performed by Dr. Katrinka Blazing 2010--- never infected, but  packed with antibiotics with surgery;  Right rotator cuff repairs x 4, complicated by repeat tears,  infection, placement of anchor material  Elevated liver function tests  Diabetes  Seronegative RA  GI bleed  GERD  Pagets disease  Stage II fibrosis of Liver  Gastroparesis  COVID-vaccine 05/20/2019, 06/17/2019, booster 12/09/2019 [name])    Allergies  NKDA    Surgical  hx  Reviewed    Social hx  Tobacco  history: Never smoked.  Marital Status Single.  Work/Occupation: Out of work since 03/13/2019 due to current  conditions; Works for [employer] in Clinical biochemist.  Alcohol Former daily EtOH use in 20s.  Abuse/NeglectDo you feel unsafe in your relationships?No , Have  you ever been hit, kicked, punched or otherwise hurt by someone  in the past year? No , PlanResources Provided, Patient states  they feel safe to return home.  Illicit drugs: Denies.    Family hx  Reviewed    Examination  Vitals  There were no vitals taken for this visit.    Physical exam  Head: NCAT  Neck: normal ROM w/o tenderness, redness, swelling  Fingers: normal ROM w/o swelling, laxity, deformity.  There is diffuse stiffness with skin smoothening on all fingers and palms.  The fingers and hands are red to pink throughout.  The fingers have stiffness and pain with extension.  Wrists: normal ROM w/o tenderness, redness, swelling, step-off  Elbows: normal ROM w/o tenderness, redness, swelling  Shoulders: normal ROM w/o tenderness, redness, swelling; no impingement; empty can test negative  Hips: normal ROM w/o tenderness, redness, swelling  Knees:  normal ROM w/o tenderness, redness, swelling, effusion  Ankles: normal ROM w/o tenderness, redness, swelling  Feet: normal ROM w/o tenderness, redness, swelling  Toes: normal ROM w/o tenderness, redness, swelling, deformity    Data  Labs  Reviewed in the chart, notable for the following:   05/23/2020   HX C-REACTIVE PROTEIN <0.29      05/23/2020   HX WBC 6.4   HX RBC 4.2   HX HGB 12.7   HX HCT 40.2   HX MCV 95.7   HX MCH 30.2   HX MCHC 31.6   HX PLATELET 259.0   HX RDW-SD 47.1   HX RDW-CV 13.3   HX MPV 10.8   HX NRBC PERCENT 0.0   HX ABSOLUTE NRBC COUNT 0.0   HX DIFFERENTIAL? No      05/23/2020   HX AFN AMER GLOMERULAR FILTRATION RATE >90   HX NON-AFN AMER GLOMERULAR FILTRATION RATE >90      05/23/2020   HX ALT 41.0   HX ALBUMIN LVL 3.5   HX ALKALINE PHOSPHATASE 245.0 (H)   HX  AST 19.0   HX BILIRUBIN DIRECT <0.1   HX BILIRUBIN TOTAL 0.3   HX TOTAL PROTEIN 6.4      05/23/2020   HX BUN 22.0 (H)   HX CREATININE 0.557   HX SODIUM LVL 136.0   HX POTASSIUM LVL 4.3   HX CHLORIDE 106.0   HX CO2 26.0   HX ANION GAP 4.0   HX CALCIUM LVL  8.7   HX GLUCOSE LVL 160.0 (H)      04/22/2020 05/23/2020   HX SED RATE 52.0 (H) 17.0     Imaging  Reviewed in the chart, notable for the following:    Diagnoses and all orders for this visit:  Seronegative rheumatoid arthritis (CMS/HCC)  Systemic sclerosis, unspecified (CMS/HCC)  -     predniSONE (Deltasone) 10 mg tablet; Take 1 tablet (10 mg) by mouth in the morning.  Elevated liver enzymes  IgG4 deficiency (CMS/HCC)  Long-term use of high-risk medication      Signed  Basilio [place], MS4  [facility] [employer] '23   [facility]    Attestation  Have seen and examined the patient with the student.  I confirmed key elements of the history and physical exam.  We reviewed and formulated the assessment and plan together which I edited as above.  [name], MD  09/04/2020  1:22 PM

## 2020-09-04 NOTE — Assessment & Plan Note (Signed)
Her Raynaud's and skin disease are stable.  She continues to have esophageal reflux despite proton pump inhibitor therapy.  She has not had any recurrent diarrhea for the last 5-6 weeks.   She is followed by GI.

## 2020-09-04 NOTE — Assessment & Plan Note (Signed)
[name] is a(n) 60 y.o. female with h/o scleroderma and seronegative rheumatoid arthritis who presents for follow-up.  Despite taking 200 mg of Plaquenil and 4 g of CellCept every day, 3 wk ago she developed severe hand pain stiffness and redness when she trialed cessation of prednisone in April 2022.  She was restarted on 10 mg of prednisone daily with substantial improvement of symptoms.  We had a lengthy discussion about cessation or decrease in prednisone as well as increasing CellCept.  She states some concern over twitchiness of the hands with CellCept.  However she is willing to try an increase in this medication.  Will increase to 1500 mg in the AM, 1000 mg in the PM.  She will then try to taper the prednisone down to 5 mg.

## 2020-09-04 NOTE — Assessment & Plan Note (Signed)
She is due for monitoring labs however she has these drawn during her infusion of IVIG therefore I will await those results.  Her infusion scheduled for next week.

## 2020-09-04 NOTE — Assessment & Plan Note (Signed)
Continue monthly IVIG to prevent severe infection while on immunosuppressants.

## 2020-09-13 ENCOUNTER — Telehealth (HOSPITAL_BASED_OUTPATIENT_CLINIC_OR_DEPARTMENT_OTHER)

## 2020-09-13 NOTE — Telephone Encounter (Signed)
[place] from [facility] LVM saying they need a callback regarding the PA for Gammagard. They need assistance answering the criteria questions.   Please call [phone] and reference PA# [id number].

## 2020-09-14 ENCOUNTER — Encounter (HOSPITAL_BASED_OUTPATIENT_CLINIC_OR_DEPARTMENT_OTHER): Admitting: Internal Medicine

## 2020-09-14 ENCOUNTER — Other Ambulatory Visit

## 2020-09-14 ENCOUNTER — Ambulatory Visit
Admit: 2020-09-14 | Discharge: 2020-09-14 | Payer: PRIVATE HEALTH INSURANCE | Attending: Internal Medicine | Primary: Student in an Organized Health Care Education/Training Program

## 2020-09-14 VITALS — BP 119/77 | HR 101 | Temp 97.8°F | Resp 20 | Ht 63.0 in | Wt 148.4 lb

## 2020-09-14 DIAGNOSIS — R102 Pelvic and perineal pain: Secondary | ICD-10-CM

## 2020-09-14 LAB — URINALYSIS
Bacteria, Ur: NEGATIVE
Bilirubin, Ur: NEGATIVE
Blood, Ur: NEGATIVE
Hyaline Casts, Ur: 1.5 /LPF (ref 0–8)
Ketones, Ur: NEGATIVE
Leukocyte Esterase, Ur: NEGATIVE
Nitrite, Ur: NEGATIVE
Protein, Ur: NEGATIVE
RBC, Ur: 1.1 /HPF (ref 0–4)
Specific Gravity, Ur: >=1.035 (ref 1.001–1.035)
Squamous Epithelial Cells, Ur: NEGATIVE /HPF
Urobilinogen, Ur: 0.2 EU
WBC, Ur: 4 /HPF (ref 0–5)
pH, Ur: 5.5

## 2020-09-14 MED ORDER — ketoconazole (NIZOral) 2 % cream
2 | Freq: Every day | TOPICAL | 11 refills | Status: DC
Start: 2020-09-14 — End: 2020-12-08

## 2020-09-14 MED ORDER — hydrocortisone 0.5 % ointment
0.5 | Freq: Two times a day (BID) | TOPICAL | 11 refills | 30.00000 days | Status: AC | PRN
Start: 2020-09-14 — End: ?

## 2020-09-14 NOTE — Assessment & Plan Note (Signed)
Symptoms remain relatively controlled.  Currently weaning prednisone with rheumatology with plans for next infusion this Friday.   - continue care per rheumatology

## 2020-09-14 NOTE — Assessment & Plan Note (Signed)
Intermittently gets small, raised red bumps over shins responsive to steroids.  Likely folliculitis.  - hydrocortisone cream as needed  - re-prescribe ketoconazole per patient request

## 2020-09-14 NOTE — Assessment & Plan Note (Signed)
Continues to walk better with rehab and pain has improved.  Attempting to use oxycodone sparingly.    - continue physical therapy  - refill oxycodone tablet today (opioid contract completed in [employer])  - change pregabalin to 50 mg tablets

## 2020-09-14 NOTE — Assessment & Plan Note (Signed)
Unclear cause, but patient suggests that it's most prominent with a full bladder which points to an anatomical cause likely from significant weight loss.  Low concern for uterine pathology given lack of post-menopausal bleeding.  Will do preliminary work-up for microscopic hematuria given symptoms revolve around full bladder.  If everything else remains normal, could also consider pelvic x-ray to evaluate for Paget's disease progression.  - urinalysis with microscopy  - consider pelvic x-ray in the future if work-up negative

## 2020-09-14 NOTE — Assessment & Plan Note (Signed)
Noted on bilateral mid-shins.  Can occasionally get painful, but not extensive.  No concern for superficial thrombosis.  - compression stockings

## 2020-09-14 NOTE — Progress Notes (Signed)
[facility]  [address]  [address]  [place] [address]  Dept: [phone]  Dept Fax: [phone]     Patient ID: [name] is a 60 y.o. female who presents for follow-up of multiple issues.    Subjective   HPI   CREST, scoliosis and spinal stenosis  - For most part good, had set back with swelling in hands and leg so spoke to [name], restarted Prednisone 10 mg, will plan to wean to 5 mg + increased MMF to 3 pills/2pills morning and night   - swelling has improved as of today  - Back intermittently hurts - uses lidocaine cream/patches, uses massage machine with benefit  - Oxycodone takes "when needed" tries to use sparingly/ predominately uses Baclofen    Suprapubic pain  - noted when bladder is full over right inguinal region, otherwise not painful  - denies any vaginal bleeding or hematuria  - has lost weight recently    Varicose veins  - has noted bilateral swelling over lower legs that can swell and get painful, but then decrease in size    Current Outpatient Medications   Medication Instructions   ? baclofen (LIORESAL) 10 mg, oral, 2 times daily   ? calcium carbonate-vitamin D3 500 mg-5 mcg (200 unit) tablet 1 tablet, oral, Daily   ? cyanocobalamin (VITAMIN B-12) 100 mcg, oral, Daily   ? famotidine (PEPCID) 20 mg, oral, Daily PRN   ? hydrocortisone 0.5 % ointment 1 application, Topical, 2 times daily PRN   ? hydroxychloroquine (PLAQUENIL) 200 mg, oral, Daily   ? immune globulin, human, (Gammagard) infusion Receives infusion every 4 weeks, next dose 8/5   ? Jardiance 10 mg, oral, Daily   ? ketoconazole (NIZOral) 2 % cream 1 application, Topical, Daily   ? lidocaine (Lidoderm) 5 % patch 1 patch, topical (top), Daily, Remove & discard patch within 12 hours or as directed by MD.   ? lidocaine (LMX) 4 % cream 1 application, Topical, 4 times daily PRN   ? metFORMIN (OSM) (FORTAMET) 2,000 mg, oral, Daily with breakfast, Do not  crush, chew, or split.    ? mycophenolate (Cellcept) 500 mg tablet 2 tablets, oral   ? omeprazole (PRILOSEC) 20 mg, oral, 2 times daily   ? oxyCODONE (ROXICODONE) 10 mg, oral, 2 times daily   ? predniSONE (DELTASONE) 10 mg, oral, Daily   ? pregabalin (Lyrica) 25 mg capsule TAKE 1 CAPSULE BY MOUTH THREE TIMES DAILY   ? TRAZODONE HCL ER PO 100 mg, oral, Nightly PRN   ? ursodiol (Actigall) 500 mg tablet 1 tablet, oral, 2 times daily       Objective   Visit Vitals  BP 119/77   Pulse 101   Temp 36.6 ?C (97.8 ?F) (Oral)   Resp 20   Ht 1.6 m   Wt 67.3 kg   SpO2 98%   BMI 26.29 kg/m?   BSA 1.73 m?       Physical Exam    Abd: soft, tender to palpation over right suprapubic/inguinal region without evidence of nodule beneath skin  Ext: two soft, compressible subcutaneous swelling on bilateral lateral shins, no nodules    Assessment/Plan   [name] was seen today for follow-up.  Suprapubic pain  Assessment & Plan:  Unclear cause, but patient suggests that it's most prominent with a full bladder which points to an anatomical cause likely from significant weight loss.  Low concern  for uterine pathology given lack of post-menopausal bleeding.  Will do preliminary work-up for microscopic hematuria given symptoms revolve around full bladder.  If everything else remains normal, could also consider pelvic x-ray to evaluate for Paget's disease progression.  - urinalysis with microscopy  - consider pelvic x-ray in the future if work-up negative    Orders:  -     Urinalysis; Future  -     Urinalysis  Rash and nonspecific skin eruption  Assessment & Plan:  Intermittently gets small, raised red bumps over shins responsive to steroids.  Likely folliculitis.  - hydrocortisone cream as needed  - re-prescribe ketoconazole per patient request  Orders:  -     ketoconazole (NIZOral) 2 % cream; Apply 1 application topically in the morning.  -     hydrocortisone 0.5 % ointment; Apply 1 application topically if needed in the morning and at bedtime for  rash.  CREST (calcinosis, Raynaud's phenomenon, esophageal dysfunction, sclerodactyly, telangiectasia) (CMS/HCC)  Assessment & Plan:  Symptoms remain relatively controlled.  Currently weaning prednisone with rheumatology with plans for next infusion this Friday.   - continue care per rheumatology  Asymptomatic varicose veins of both lower extremities  Assessment & Plan:  Noted on bilateral mid-shins.  Can occasionally get painful, but not extensive.  No concern for superficial thrombosis.  - compression stockings  Spinal stenosis, lumbar region, with neurogenic claudication  Assessment & Plan:  Continues to walk better with rehab and pain has improved.  Attempting to use oxycodone sparingly.    - continue physical therapy  - refill oxycodone tablet today (opioid contract completed in [employer])  - change pregabalin to 50 mg tablets    Staffed with Dr. Darrick Penna    Tomma Lightning, MD  Internal Medicine PGY-3

## 2020-09-15 ENCOUNTER — Other Ambulatory Visit (HOSPITAL_BASED_OUTPATIENT_CLINIC_OR_DEPARTMENT_OTHER): Admitting: Physician Assistant

## 2020-09-15 MED ORDER — oxyCODONE (Roxicodone) 10 mg immediate release tablet
10 | ORAL_TABLET | Freq: Two times a day (BID) | ORAL | 0 refills | 8.00000 days | Status: DC
Start: 2020-09-15 — End: 2020-11-02

## 2020-09-15 NOTE — Progress Notes (Signed)
PMP reviewed, no other prescribers or unexpected opioid prescriptions  Pregabalin 25mg  112/28 day supply filled on 5/27 by Dr. Lurline Idol = not yet due  Oxycodone 10mg  56/28 day supply filled 5/2 - due for refill  Last appt: 09/14/20  Next appt: 12/16/20  Last UDS: 06/08/20  Last contract: 06/08/20  **see blue sticky note in chart for these dates, please update manually if done    Refill pended for PCP preceptor

## 2020-09-16 LAB — CMP (EXT)
ALT/SGPT (EXT): 29 U/L (ref 10–49)
AST/SGOT (EXT): 26 U/L (ref 6–40)
Albumin (EXT): 3.8 g/dL (ref 3.5–4.8)
Alkaline Phosphatase (EXT): 191 U/L — ABNORMAL HIGH (ref 27–129)
Anion Gap (EXT): 14 mmol/L (ref 3–17)
BUN (EXT): 18 mg/dL (ref 9–23)
Bilirubin, Total (EXT): 0.4 mg/dL (ref 0.0–1.0)
CO2 (EXT): 20 mmol/L (ref 20–31)
CalciumCalcium (EXT): 8.7 mg/dL (ref 8.7–10.4)
Chloride (EXT): 104 mmol/L (ref 95–106)
Creatinine (EXT): 0.67 mg/dL (ref 0.50–1.30)
GFR Estimated (Calc) (EXT): 102 mL/min/{1.73_m2} (ref >60)
Globulin (EXT): 3.1 g/dL (ref 1.9–4.1)
Glucose (EXT): 120 mg/dL — ABNORMAL HIGH (ref 74–106)
Potassium (EXT): 3.8 mmol/L (ref 3.5–5.2)
Protein (EXT): 6.9 g/dL (ref 5.7–8.2)
Sodium (EXT): 138 mmol/L (ref 136–145)

## 2020-09-21 ENCOUNTER — Encounter (HOSPITAL_BASED_OUTPATIENT_CLINIC_OR_DEPARTMENT_OTHER): Admitting: Student in an Organized Health Care Education/Training Program

## 2020-09-21 ENCOUNTER — Encounter (HOSPITAL_BASED_OUTPATIENT_CLINIC_OR_DEPARTMENT_OTHER): Admitting: Internal Medicine

## 2020-09-22 ENCOUNTER — Telehealth (HOSPITAL_BASED_OUTPATIENT_CLINIC_OR_DEPARTMENT_OTHER)

## 2020-09-22 NOTE — Telephone Encounter (Signed)
[facility] is  Requesting pt's recent IGG results be faxed for PA reasons  Fax results to [phone] f  Can call [phone] with any questions

## 2020-09-22 NOTE — Telephone Encounter (Signed)
The results need to be from the past year. The last ones I see or from 09/12/19

## 2020-09-23 NOTE — Telephone Encounter (Signed)
Someone please have pt get these labs so we can get her IgG approved by insurance.  Thanks!

## 2020-09-24 ENCOUNTER — Telehealth (HOSPITAL_BASED_OUTPATIENT_CLINIC_OR_DEPARTMENT_OTHER): Admitting: Rheumatology

## 2020-09-24 ENCOUNTER — Ambulatory Visit
Payer: PRIVATE HEALTH INSURANCE | Attending: Rheumatology | Primary: Student in an Organized Health Care Education/Training Program

## 2020-09-24 NOTE — Telephone Encounter (Signed)
[name] just calling to let you know she will be doing labs on July 1st. THanks

## 2020-09-28 ENCOUNTER — Telehealth (HOSPITAL_BASED_OUTPATIENT_CLINIC_OR_DEPARTMENT_OTHER): Admitting: Physician Assistant

## 2020-09-28 NOTE — Telephone Encounter (Signed)
Tianna from [facility] called.     Regarding pt's Gammaguard home infusions - Insurance is denying infusion because pt needs recent IVIG labs done within the last 6 months (or drawn now). Her infusion order also expires on October 01, 2020. Needs new order. Cannot infuse again without new authorization.     Fax labs and new order to [phone]    To ask questions for Brennan Bailey [phone]    [name], can you help with this?

## 2020-09-28 NOTE — Telephone Encounter (Signed)
Quick note    Called Tianna to follow-up and provide update. Bennie Hind states that no new infusion order is needed as the last order was sent on 05/2020. The PA is the one expiring on 10/01/20. They need the labs in order to renew the PA. Per conversation with pt she plans on getting labs done on     Called patient to relay info, pt will be coming to [facility] for labs on Friday, her next infusion is on 10/15/20. Will forward labs to [facility] as soon as available for PA renewal.     Josiah Lobo, [name]

## 2020-09-30 ENCOUNTER — Encounter

## 2020-09-30 ENCOUNTER — Other Ambulatory Visit (HOSPITAL_BASED_OUTPATIENT_CLINIC_OR_DEPARTMENT_OTHER): Admitting: Student in an Organized Health Care Education/Training Program

## 2020-09-30 NOTE — Telephone Encounter (Signed)
 Noted  Willfwd PCP, Dr Dolores Frame RN   09/30/20   2:11 PM

## 2020-10-01 ENCOUNTER — Other Ambulatory Visit

## 2020-10-01 ENCOUNTER — Ambulatory Visit
Admit: 2020-10-01 | Discharge: 2020-10-01 | Payer: PRIVATE HEALTH INSURANCE | Primary: Student in an Organized Health Care Education/Training Program

## 2020-10-01 ENCOUNTER — Telehealth (HOSPITAL_BASED_OUTPATIENT_CLINIC_OR_DEPARTMENT_OTHER)

## 2020-10-01 ENCOUNTER — Encounter (HOSPITAL_BASED_OUTPATIENT_CLINIC_OR_DEPARTMENT_OTHER)

## 2020-10-01 ENCOUNTER — Other Ambulatory Visit
Admit: 2020-10-01 | Payer: PRIVATE HEALTH INSURANCE | Primary: Student in an Organized Health Care Education/Training Program

## 2020-10-01 VITALS — BP 107/76 | HR 95 | Temp 97.7°F | Ht 63.0 in | Wt 149.0 lb

## 2020-10-01 DIAGNOSIS — K922 Gastrointestinal hemorrhage, unspecified: Secondary | ICD-10-CM

## 2020-10-01 LAB — BASIC METABOLIC PANEL
Anion Gap: 10 mmol/L (ref 3–14)
BUN: 20 mg/dL (ref 6–24)
CO2 (Bicarbonate): 23 mmol/L (ref 20–32)
Calcium: 9.5 mg/dL (ref 8.5–10.5)
Chloride: 107 mmol/L (ref 98–110)
Creatinine: 0.75 mg/dL (ref 0.55–1.30)
Glucose: 208 mg/dL — ABNORMAL HIGH (ref 70–139)
Potassium: 4.1 mmol/L (ref 3.6–5.2)
Sodium: 140 mmol/L (ref 135–146)
eGFRcr: 93 mL/min/{1.73_m2} (ref >=60)

## 2020-10-01 LAB — CBC WITH DIFFERENTIAL
Basophils %: 0.4 %
Basophils Absolute: 0.05 10*3/uL (ref 0.00–0.22)
Eosinophils %: 0.3 %
Eosinophils Absolute: 0.03 10*3/uL (ref 0.00–0.50)
Hematocrit: 41.1 % (ref 32.0–47.0)
Hemoglobin: 13.2 g/dL (ref 11.0–16.0)
Immature Granulocytes %: 0.7 %
Immature Granulocytes Absolute: 0.08 10*3/uL (ref 0.00–0.10)
Lymphocyte %: 6.2 %
Lymphocytes Absolute: 0.73 10*3/uL (ref 0.70–4.00)
MCH: 29.8 pg (ref 26.0–34.0)
MCHC: 32.1 g/dL (ref 31.0–37.0)
MCV: 92.8 fL (ref 80.0–100.0)
MPV: 10.4 fL (ref 9.1–12.4)
Monocytes %: 3.3 %
Monocytes Absolute: 0.39 10*3/uL (ref 0.36–0.77)
NRBC %: 0 % (ref 0.0–0.0)
NRBC Absolute: 0 10*3/uL (ref 0.00–2.00)
Neutrophil %: 89.1 %
Neutrophils Absolute: 10.49 10*3/uL — ABNORMAL HIGH (ref 1.50–7.95)
Platelets: 276 10*3/uL (ref 150–400)
RBC: 4.43 M/uL (ref 3.70–5.20)
RDW-CV: 13.9 % (ref 11.5–14.5)
RDW-SD: 46.9 fL (ref 35.0–51.0)
WBC: 11.8 10*3/uL — ABNORMAL HIGH (ref 4.0–11.0)

## 2020-10-01 LAB — IGG, IGA, IGM
IgA: 62 mg/dL — ABNORMAL LOW (ref 70.0–400.0)
IgG: 912 mg/dL (ref 700–1600)
IgM: 329 mg/dL — ABNORMAL HIGH (ref 40–230)

## 2020-10-01 LAB — ALT: ALT: 26 U/L (ref 0–55)

## 2020-10-01 LAB — ALBUMIN: Albumin: 4.3 g/dL (ref 3.2–5.0)

## 2020-10-01 LAB — PROTIME-INR
INR: 1
Protime: 11.5 s (ref 9.7–14.0)

## 2020-10-01 LAB — AST: AST: 22 U/L (ref 6–42)

## 2020-10-01 LAB — GGT: GGT: 268 IU/L — ABNORMAL HIGH (ref 5–55)

## 2020-10-01 LAB — BILIRUBIN, TOTAL: Bilirubin, total: 0.5 mg/dL (ref 0.2–1.2)

## 2020-10-01 LAB — ALKALINE PHOSPHATASE: Alkaline phosphatase: 207 U/L — ABNORMAL HIGH (ref 30–130)

## 2020-10-01 MED ORDER — metFORMIN XR (Glucophage-XR) 500 mg 24 hr tablet
500 | ORAL_TABLET | Freq: Every day | ORAL | 3 refills | Status: DC
Start: 2020-10-01 — End: 2021-01-18

## 2020-10-01 NOTE — Telephone Encounter (Signed)
 Patient called because Dr. Regino Schultze requested some information from her after the appointment today.  "The name of the manufacturer for the Metformin 500 is [employer]".  If there's any question, you can reach patient at: [phone]

## 2020-10-01 NOTE — Progress Notes (Signed)
 Endocrinology Clinic Note  [name] is a 60 y.o. female seen in transfer of care for type 2 diabetes.     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   She was previously followed by Rhenda Cedars NP, last seen in October 2021.    Through the years, her A1c have been within or near target (6-7s), except for a period in 2017-2019 in which A1c was 8-11% (prednisone)  Since late 2020, A1c's have been within target.  No longer on glipizide    Interval history:   Ruptured hamstring in December 2021.   Weaned self off prednisone (was on 30mg  at the time). Had started MMF in April and worked well until this year, getting more swelling again.   Saw Dr. Horris Lynn - restarted Pred 5mg  daily. His last notes refer to systemic sclerosis rather than CREST syndrome, I updated in her records.   Drops phone often because of twitching MMF.   Sometimes sclerodactyly and raynaud's gets in the way of glucose checks. She uses spots lower down on the finger in order to avoid scarring and cracking. Right now skin is ok, no ulcerations, well perfused.   Urine bright yellow which is intermittent.  She takes Jardiance every day, which has not changed.  She has no changes in her liver symptoms.  She is seeing her GI next month.   No GI side effects  No UTIs  No yeast infections    Current diabetes medications:   Metformin 500mg  XR x 4 per day  Jardiance 10mg  daily     Glycemic Control:     A1c  6.4% POC today   Self-Monitoring 100-150 generally, occasionally 90s     Hypoglycemia None in last 6 months   Food/exercise Limited activity due to back and hamstring surgery  Does walking and squats   Other factors affecting management  Gastroparesis    Prednisone low dose for scleroderma   Elevated liver enzymes - seeing GI, etiology still uncertain. On ursodiol.   After clinic - sensor coverage -   Libre 2 $40/mo  Dexcom sensor $40/mo  Dexcom transmitter $40/mo      Complications:   Nephropathy  N   Retinopathy  N; checked in 2022. Going to be seeing ophthalmologist for plaquenil    Neuropathy  N   Foot ulcer  N   Macrovascular  N   Risk reduction  on ACE inhibitor   Previous notes state rosuvastatin ?reason discontinued     ROS: 10 point ROS negative except noted in history above    PAST MEDICAL HISTORY:   Patient Active Problem List   Diagnosis   . CVID (common variable immunodeficiency) (CMS/HCC)   . Elevated liver enzymes   . Esophageal reflux   . IgG4 deficiency (CMS/HCC)   . Seronegative rheumatoid arthritis (CMS/HCC)   . Spinal stenosis, lumbar region, with neurogenic claudication   . Type 2 diabetes mellitus without complication (CMS/HCC)   . Left hip pain   . Long-term use of high-risk medication   . Abnormal liver function tests   . Achilles rupture, left, sequela   . Hypogammaglobulinemia (CMS/HCC)   . Inflammatory arthritis   . Scoliosis   . Synovitis of hand   . Suprapubic pain   . Rash and nonspecific skin eruption   . Systemic sclerosis (CMS/HCC)   . Varicose veins of both lower extremities     MEDICATIONS:   Current Outpatient  Medications:   .  baclofen (Lioresal) 10 mg tablet, Take 10 mg by mouth in the morning and at bedtime., Disp: , Rfl:   .  calcium carbonate-vitamin D3 500 mg-5 mcg (200 unit) tablet, Take 1 tablet by mouth in the morning., Disp: , Rfl:   .  cyanocobalamin (Vitamin B-12) 1,000 mcg tablet, Take 100 mcg by mouth in the morning., Disp: , Rfl:   .  famotidine (Pepcid) 20 mg tablet, Take 20 mg by mouth if needed each day for indigestion., Disp: , Rfl:   .  hydrocortisone 0.5 % ointment, Apply 1 application topically if needed in the morning and at bedtime for rash., Disp: 28.4 g, Rfl: 11  .  hydroxychloroquine (Plaquenil) 200 mg tablet, Take 200 mg by mouth in the morning., Disp: , Rfl:   .  immune globulin, human, (Gammagard) infusion, Receives infusion every 4 weeks, next dose 8/5, Disp: , Rfl:   .  Jardiance 10 mg, Take 10 mg by mouth in  the morning., Disp: , Rfl:   .  ketoconazole (NIZOral) 2 % cream, Apply 1 application topically in the morning., Disp: 30 g, Rfl: 11  .  lidocaine (Lidoderm) 5 % patch, Apply 1 patch topically in the morning. Remove & discard patch within 12 hours or as directed by MD., Disp: , Rfl:   .  lidocaine (LMX) 4 % cream, Apply 1 application topically if needed in the morning, at noon, in the evening, and at bedtime., Disp: , Rfl:   .  mycophenolate (Cellcept) 500 mg tablet, Take 2 tablets by mouth., Disp: , Rfl:   .  omeprazole (PriLOSEC) 20 mg DR capsule, Take 20 mg by mouth in the morning and at bedtime., Disp: , Rfl:   .  oxyCODONE (Roxicodone) 10 mg immediate release tablet, Take 1 tablet (10 mg) by mouth in the morning and at bedtime., Disp: 56 tablet, Rfl: 0  .  predniSONE (Deltasone) 10 mg tablet, Take 1 tablet (10 mg) by mouth in the morning., Disp: 30 tablet, Rfl: 1  .  pregabalin (Lyrica) 25 mg capsule, TAKE 1 CAPSULE BY MOUTH THREE TIMES DAILY, Disp: 21 each, Rfl: 0  .  TRAZODONE HCL ER PO, Take 100 mg by mouth if needed at bedtime for sleep., Disp: , Rfl:   .  ursodiol (Actigall) 500 mg tablet, Take 1 tablet by mouth in the morning and at bedtime., Disp: , Rfl:   .  metFORMIN XR (Glucophage-XR) 500 mg 24 hr tablet, Take 4 tablets (2,000 mg) by mouth with breakfast. Do not crush, chew, or split., Disp: 360 tablet, Rfl: 3    SOCIAL HISTORY:   No smoking. Alcohol - rarely, few times a year, no rec drugs  Lives alone in apartment. Has support from neighbors.   Supplies are covered by insurance, not finding it onerous.     EXAMINATION:  BP 107/76 (BP Location: Right arm, Patient Position: Sitting)   Pulse 95   Temp 36.5 C (97.7 F)   Ht 1.6 m   Wt 67.6 kg   SpO2 98%   BMI 26.39 kg/m    General: Appears well, NAD, well-nourished,  Eyes: No conjunctival injection, normal EOM  HEENT: good dentition, normal hearing  CVS: Normal heart sounds, no murmurs. Peripheral pulses present. No peripheral edema.  Resp:  CTAB, normal respiratory effort.   Abd: soft non-tender  Skin: No acanthosis nigricans or vitiligo.  Tanned.   Injection sites: no lipohypertrophy  Extremities: There is sclerodactyly, flexion contractions, mildly  swollen joints. Well-perfused, no scarring or gangrene.     FLOWSHEET   Date  A1c  LDL-C Urine ACR Cr (eGFR)  BP  Weight  Other   10/21 6.1         06/22 6.4   0.75 (93) 107/76 67.6kg      Lab Results   Component Value Date    CREATININE 0.75 10/01/2020    EGFR 93 10/01/2020     ASSESSMENT AND PLAN  Type 2 diabetes with A1c at target.  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     Her A1c remains excellent.  She wondered about reducing her diabetes medications.  We discussed glycemic targets to keep A1c <7.  It is possible to trial tapering one of the medications (for example, decrease metformin by 1 pill a day) and see if A1c can still remain within target.  However, after reviewing her A1c trajectories over the last few years, she felt that it was safe for her to stay on the current regimen.  We can always revisit this later, if the A1c remains stable within the target range.    BP is in target, no changes to lisinopril.  Re: Urine color, sometimes Jardiance can cause a color change. She has just gotten blood work done for her other doctors which include liver enzymes and bilirubin, which will clarify whether there is cholestasis.  She has no symptoms of UTI and no visible jaundice.   Med list updated with correct metformin listing.   I will ask about her coverage for continuous glucose sensor.  Although her A1c is well controlled, doing finger prick testing is an issue given the sclerodactyly.    Follow-up in 6 months. Call sooner if  persistent high BG or clinical change, eg prednisone increase    Marcelo Serum, MD   Endocrinology, Diabetes and Metabolism    ---  40 minutes required to complete visit, including reviewing investigations and prior documentation in transfer of care, face-to-face assessment, counselling, discussion of med adjustment, and documentation.

## 2020-10-05 ENCOUNTER — Telehealth (HOSPITAL_BASED_OUTPATIENT_CLINIC_OR_DEPARTMENT_OTHER): Admitting: Student in an Organized Health Care Education/Training Program

## 2020-10-05 LAB — IMMUNOGLOBULIN IGG SUBCLASSES
Immunoglobulin G Subclass 1: 434 mg/dL (ref 382–929)
Immunoglobulin G Subclass 2: 325 mg/dL (ref 241–700)
Immunoglobulin G Subclass 3: 21 mg/dL — ABNORMAL LOW (ref 22–178)
Immunoglobulin G Subclass 4: 13.7 mg/dL (ref 4–86)
Immunoglobulin G, Serum: 831 mg/dL (ref 600–1640)

## 2020-10-05 MED ORDER — pregabalin (Lyrica) 25 mg capsule
25 | Freq: Two times a day (BID) | ORAL | 0 refills | Status: DC
Start: 2020-10-05 — End: 2020-10-06

## 2020-10-05 NOTE — Telephone Encounter (Signed)
 Medication dose confirmed, updated med list.

## 2020-10-05 NOTE — Telephone Encounter (Signed)
 Pharmacy on file called to f/u regarding prescription for Pregablin and stated they have not received prescription yet. Thanks

## 2020-10-05 NOTE — Telephone Encounter (Signed)
 Pt would like a call back regarding medication refill and also stated she is still waiting on a call back from Dr Dorene Grebe. Pt stated she is out of medication. Thanks

## 2020-10-05 NOTE — Telephone Encounter (Signed)
 Pt wants to send a Message to Dr. Dorene Grebe..  Pt requesting a CB from Dr.Chawla.  Please call pt back.    Pt also requesting a New Medication Script Refill on     Pregabalin 50 mg (Not 25 mg)    Send to [facility] on [address] [place], [place].

## 2020-10-05 NOTE — Telephone Encounter (Signed)
 Per last PCP note with Dr. Gonzella Lex. Dorene Grebe, patient to continue 50mg  BID

## 2020-10-06 ENCOUNTER — Telehealth (HOSPITAL_BASED_OUTPATIENT_CLINIC_OR_DEPARTMENT_OTHER): Admitting: Internal Medicine

## 2020-10-06 ENCOUNTER — Telehealth (HOSPITAL_BASED_OUTPATIENT_CLINIC_OR_DEPARTMENT_OTHER): Admitting: Student in an Organized Health Care Education/Training Program

## 2020-10-06 MED ORDER — pregabalin (Lyrica) 50 mg capsule
50 | ORAL_CAPSULE | Freq: Two times a day (BID) | ORAL | 2 refills | Status: DC
Start: 2020-10-06 — End: 2021-01-06

## 2020-10-06 MED ORDER — traZODone (Desyrel) 100 mg tablet
100 | ORAL_TABLET | Freq: Every evening | ORAL | 3 refills | Status: DC | PRN
Start: 2020-10-06 — End: 2021-01-13

## 2020-10-06 NOTE — Telephone Encounter (Signed)
 I resent lyrica as 50 mg pills twice daily for ease  Sent trazodone  She says she called last week for her refills but unfortunately I am not seeing the message and she was out of lyrica this week.  She is so looking forward to meeting her new PCP Dr. Craige Cotta  Will try to get on the portal  Darrick Penna, MD

## 2020-10-06 NOTE — Telephone Encounter (Signed)
 Py calling to request a call from Dr Dorene Grebe. She stated that it is in regards to her medication. Please give pt a call.

## 2020-10-11 ENCOUNTER — Telehealth (HOSPITAL_BASED_OUTPATIENT_CLINIC_OR_DEPARTMENT_OTHER): Admitting: Rheumatology

## 2020-10-11 NOTE — Telephone Encounter (Signed)
 [name] having infusion on this Friday, and [facility] has not received PA for this.  She had labs done 7/1 and have not been fwd  She is also requesting a c/b from  [facility] for some testing before she leaves in August.  She has left messages and has not returned c/b.   Hands , especially thumbs, joints are very painful. Dr [name] upped prednisone and not working, Please cb

## 2020-10-11 NOTE — Telephone Encounter (Signed)
 Quick note    Agent confirmed receipt of lab submitted last week. She will have team follow-up with Payer as they take care of their own PA.     Elizette Shek [place] [employer], [name]

## 2020-10-12 NOTE — Telephone Encounter (Signed)
 Pt called in today very upset that she didnt get a call about her infusion that is tryiing to be done on friday. Pt says that she has joint pain see notes below. Pt call number is [phone]

## 2020-10-12 NOTE — Telephone Encounter (Signed)
 Patient calling again on nurse VM looking to speak with someone regarding her infusion and getting it set up for Friday.  She would like to speak with someone.

## 2020-10-17 ENCOUNTER — Other Ambulatory Visit
Admit: 2020-12-30 | Payer: PRIVATE HEALTH INSURANCE | Primary: Student in an Organized Health Care Education/Training Program

## 2020-10-17 ENCOUNTER — Ambulatory Visit
Admit: 2020-12-30 | Payer: PRIVATE HEALTH INSURANCE | Primary: Student in an Organized Health Care Education/Training Program

## 2020-10-17 LAB — BASIC METABOLIC PANEL
Anion Gap: 4 mmol/L (ref 3–14)
BUN: 14 mg/dL (ref 6–24)
CO2 (Bicarbonate): 26 mmol/L (ref 20–32)
Calcium: 9.4 mg/dL (ref 8.5–10.5)
Chloride: 107 mmol/L (ref 98–110)
Creatinine: 0.64 mg/dL (ref 0.55–1.30)
Glucose: 110 mg/dL (ref 70–110)
Potassium: 3.6 mmol/L (ref 3.6–5.2)
Sodium: 137 mmol/L (ref 135–146)
eGFRcr: 103 mL/min/{1.73_m2} (ref >=60)

## 2020-10-17 LAB — CBC WITH DIFFERENTIAL
Basophils %: 0.6 %
Basophils Absolute: 0.04 10*3/uL (ref 0.00–0.22)
Eosinophils %: 0.6 %
Eosinophils Absolute: 0.04 10*3/uL (ref 0.00–0.50)
Hematocrit: 44.7 % (ref 32.0–47.0)
Hemoglobin: 13.9 g/dL (ref 11.0–16.0)
Immature Granulocytes %: 0.3 %
Immature Granulocytes Absolute: 0.02 10*3/uL (ref 0.00–0.10)
Lymphocyte %: 16.8 %
Lymphocytes Absolute: 1.17 10*3/uL (ref 0.70–4.00)
MCH: 29.5 pg (ref 26.0–34.0)
MCHC: 31.1 g/dL (ref 31.0–37.0)
MCV: 94.9 fL (ref 80.0–100.0)
MPV: 11 fL (ref 9.1–12.4)
Monocytes %: 5.2 %
Monocytes Absolute: 0.36 10*3/uL (ref 0.36–0.77)
NRBC %: 0 % (ref 0.0–0.0)
NRBC Absolute: 0 10*3/uL (ref 0.00–2.00)
Neutrophil %: 76.5 %
Neutrophils Absolute: 5.32 10*3/uL (ref 1.50–7.95)
Platelets: 263 10*3/uL (ref 150–400)
RBC: 4.71 M/uL (ref 3.70–5.20)
RDW-CV: 14 % (ref 11.5–14.5)
RDW-SD: 48.4 fL (ref 35.0–51.0)
WBC: 7 10*3/uL (ref 4.0–11.0)

## 2020-10-17 LAB — HEPATIC FUNCTION PANEL
ALT: 35 U/L (ref 0–55)
AST: 24 U/L (ref 6–42)
Albumin: 3.9 g/dL (ref 3.2–5.0)
Alkaline phosphatase: 156 U/L — ABNORMAL HIGH (ref 30–130)
Bilirubin, direct: 0.1 mg/dL (ref 0.0–0.5)
Bilirubin, total: 0.5 mg/dL (ref 0.2–1.2)
Protein, total: 7.4 g/dL (ref 6.0–8.4)

## 2020-10-17 LAB — RAINBOW DRAW SST GOLD TOP

## 2020-10-17 LAB — C-REACTIVE PROTEIN: CRP: <0.29 mg/dL — ABNORMAL LOW (ref 0.29–0.80)

## 2020-10-17 LAB — SEDIMENTATION RATE, AUTOMATED: Sed Rate: 13 mm/h (ref 0–30)

## 2020-10-18 ENCOUNTER — Telehealth (HOSPITAL_BASED_OUTPATIENT_CLINIC_OR_DEPARTMENT_OTHER)

## 2020-10-18 ENCOUNTER — Ambulatory Visit
Admit: 2020-10-18 | Payer: PRIVATE HEALTH INSURANCE | Primary: Student in an Organized Health Care Education/Training Program

## 2020-10-18 NOTE — Telephone Encounter (Signed)
 Pt LVM. She wants to speak directly to [facility] about pulmonary tests. She said it was previously discussed.

## 2020-10-19 NOTE — Telephone Encounter (Signed)
 ECHO and PFT were ordered to compare to previous.    She is seeing ortho Friday-If he does not recommend surgical intervention for her hands then he can inject but did not want to ICS if going to do surgery.

## 2020-10-22 ENCOUNTER — Ambulatory Visit
Admit: 2020-10-22 | Discharge: 2020-10-22 | Payer: PRIVATE HEALTH INSURANCE | Attending: Hand Surgery | Primary: Student in an Organized Health Care Education/Training Program

## 2020-10-22 ENCOUNTER — Other Ambulatory Visit (HOSPITAL_BASED_OUTPATIENT_CLINIC_OR_DEPARTMENT_OTHER): Admitting: Hand Surgery

## 2020-10-22 ENCOUNTER — Encounter (HOSPITAL_BASED_OUTPATIENT_CLINIC_OR_DEPARTMENT_OTHER): Admitting: Hand Surgery

## 2020-10-22 ENCOUNTER — Inpatient Hospital Stay
Admit: 2020-10-22 | Payer: PRIVATE HEALTH INSURANCE | Primary: Student in an Organized Health Care Education/Training Program

## 2020-10-22 ENCOUNTER — Other Ambulatory Visit

## 2020-10-22 DIAGNOSIS — M79641 Pain in right hand: Secondary | ICD-10-CM

## 2020-10-22 DIAGNOSIS — M19042 Primary osteoarthritis, left hand: Secondary | ICD-10-CM

## 2020-10-22 MED ORDER — lidocaine (Xylocaine) 10 mg/mL (1 %) injection 1 mL
10 | Freq: Once | INTRAMUSCULAR | Status: AC | PRN
Start: 2020-10-22 — End: 2020-10-22
  Administered 2020-10-22: 16:00:00 1 mL via INTRAMUSCULAR

## 2020-10-22 MED ORDER — betamethasone acet,sod phos (Celestone) injection 6 mg
6 | Freq: Once | INTRAMUSCULAR | Status: AC | PRN
Start: 2020-10-22 — End: 2020-10-22
  Administered 2020-10-22: 16:00:00 6 mg via INTRA_ARTICULAR

## 2020-10-22 NOTE — Progress Notes (Signed)
 [facility] Note    Patient Name: [name]  MRN: [id number]    Chief Complaint: bilateral hand pain    History of Present Illness:  [name] is a 60 y.o. female well-known to Dr. [name], history of scleroderma and seronegative RA presents for bilateral hand pain.  She reports over the past 3 to 4 months she has had worsening right thumb pain.  She reports her thumb is getting stuck.  She has no known history of a trigger thumb in the past.  She also endorses pain at the base of the thumb.  Additionally, she has had worsening pain at the middle finger MP joint.  She also endorses fluid-filled area at the distal aspect of the middle fingers bilaterally, she believes on the left side she had burst of one of the fluid-filled areas and this relieved her pain temporarily.  She continues to be closely followed by rheumatology, she is now off of prednisone and is on CellCept for long-term maintenance of her rheumatological conditions.    Allergies:  No Known Allergies    Medical History:  Past Medical History:   Diagnosis Date   . Bilateral carpal tunnel syndrome    . CREST variant of scleroderma (CMS/HCC) 2017   . GERD (gastroesophageal reflux disease)    . History of venous thromboembolism 2006    Left upper extremity   . IgG4 deficiency (CMS/HCC)    . Liver fibrosis     Stage II   . Paget's disease of bone    . Peptic ulcer disease    . Rheumatoid arthritis of multiple sites with negative rheumatoid factor (CMS/HCC)    . Scoliosis     s/p left L4-L5 transforaminal lumbar interbody fusion   . Spinal stenosis at L4-L5 level     s/p L4-L5 laminectomy wtih complete facetctomy with decompression (02/2020)   . Type 2 diabetes mellitus, without long-term current use of insulin (CMS/HCC)        Surgical History:  Past Surgical History:   Procedure Laterality Date   . CARPAL TUNNEL RELEASE Left 01/2011   . CHOLECYSTECTOMY  1990   . FOOT SURGERY Left 2008    Plating of left 3rd metatarsal   . HAND SURGERY   2013   . HAND SURGERY  2015   . HARDWARE REMOVAL Right 2006    shoulder due to infection   . HERNIA REPAIR  1990   . LUMBAR FUSION Left 02/2020    L4-L5 transforaminal lumbar interbody fusion   . LUMBAR LAMINECTOMY  02/2020    L4-L5, with complete left facetectomy and decompression   . ORIF FOOT FRACTURE Left     Left 4th metatarsal   . OSTEOTOMY AND ULNAR SHORTENING Left 1994   . OTHER SURGICAL HISTORY Left 05/2019    Hamstring repair   . REVERSE TOTAL SHOULDER ARTHROPLASTY Left 03/07/2016   . ROTATOR CUFF REPAIR Right 2006   . SHOULDER SURGERY Right 2007    Recurrent infection   . TOTAL KNEE ARTHROPLASTY Bilateral 2009   . TOTAL KNEE ARTHROPLASTY Right 2010    replacement of prior arthroplasty       Current Medications:  Current Outpatient Medications   Medication Sig Dispense Refill   . baclofen (Lioresal) 10 mg tablet Take 10 mg by mouth in the morning and at bedtime.     . calcium carbonate-vitamin D3 500 mg-5 mcg (200 unit) tablet Take 1 tablet by mouth in the morning.     [name]  cyanocobalamin (Vitamin B-12) 1,000 mcg tablet Take 100 mcg by mouth in the morning.     . famotidine (Pepcid) 20 mg tablet Take 20 mg by mouth if needed each day for indigestion.     . hydrocortisone 0.5 % ointment Apply 1 application topically if needed in the morning and at bedtime for rash. 28.4 g 11   . hydroxychloroquine (Plaquenil) 200 mg tablet Take 200 mg by mouth in the morning.     . immune globulin, human, (Gammagard) infusion Receives infusion every 4 weeks, next dose 8/5     . Jardiance 10 mg Take 10 mg by mouth in the morning.     Aaron Aas ketoconazole (NIZOral) 2 % cream Apply 1 application topically in the morning. 30 g 11   . lidocaine (Lidoderm) 5 % patch Apply 1 patch topically in the morning. Remove & discard patch within 12 hours or as directed by MD.     . lidocaine (LMX) 4 % cream Apply 1 application topically if needed in the morning, at noon, in the evening, and at bedtime.     . metFORMIN XR (Glucophage-XR) 500 mg 24 hr  tablet Take 4 tablets (2,000 mg) by mouth with breakfast. Do not crush, chew, or split. 360 tablet 3   . mycophenolate (Cellcept) 500 mg tablet Take 2 tablets by mouth.     Aaron Aas omeprazole (PriLOSEC) 20 mg DR capsule Take 20 mg by mouth in the morning and at bedtime.     Aaron Aas oxyCODONE (Roxicodone) 10 mg immediate release tablet Take 1 tablet (10 mg) by mouth in the morning and at bedtime. 56 tablet 0   . predniSONE (Deltasone) 10 mg tablet Take 1 tablet (10 mg) by mouth in the morning. 30 tablet 1   . pregabalin (Lyrica) 50 mg capsule Take 1 capsule (50 mg) by mouth in the morning and at bedtime. 60 capsule 2   . traZODone (Desyrel) 100 mg tablet Take 1 tablet (100 mg) by mouth if needed at bedtime for sleep. 30 tablet 3   . ursodiol (Actigall) 500 mg tablet Take 1 tablet by mouth in the morning and at bedtime.       No current facility-administered medications for this visit.       Social history:  Tobacco Use: Low Risk    . Smoking Tobacco Use: Never Smoker   . Smokeless Tobacco Use: Never Used     Alcohol Use: Not on file       Physical Exam  Gen: well-appearing, in NAD, A&O  Examination of the right hand demonstrates obvious arthritic deformities.  She has fairly significant swelling at the volar aspect of the right thumb with active triggering noted.  She has very limited IP flexion at the thumb.  She also has tenderness to palpation about the [facility] joint.     Range of motion of the hand is as follows  Index finger MP flexion is 65 degrees, PIP flexion 100 degrees lacking 20 degrees of extension  Middle finger MP flexion is 70 degrees, PIP flexion 95 degrees, lacking 25 degrees of extension  Ring finger MP flexion 70 degrees, PIP flexion 95 degrees, lacking 20 degrees of extension  Small finger MP flexion 75 degrees, PIP flexion 95 degrees, lacking 25 degrees of extension    She is diffusely tender about the MP joints and PIP joints, however this is most prominent at the middle finger MP joint.  Sensation is intact to  light touch, hand is warm well perfused  Imaging:   X-rays of the bilateral hands demonstrate significant degenerative changes, most prominently at the MP joints diffusely, right small finger PIP joint, left ring and small finger PIP joint    Procedure:  S Inj/Asp: R long MCP on 10/22/2020 11:30 AM  Indications: pain and joint swelling  Details: 25 G needle, ulnar approach  Medications: 6 mg betamethasone acet,sod phos 6 mg/mL; 1 mL lidocaine 10 mg/mL (1 %)    The dorsal aspect of the right hand prepped with alcohol, 0.75cc of Betamethasone and 0.25cc of Lidocaine were used to inject into the dorsal ulnar aspect of the left MCP joint.   Immediately prior to procedure a time out was called to verify the correct patient, procedure, equipment, support staff and site/side marked as required. Patient was prepped and draped in the usual sterile fashion.     Hand / UE Inj/Asp: R thumb A1 for trigger finger on 10/22/2020 11:30 AM  Details: 25 G needle, volar approach  Medications: 6 mg betamethasone acet,sod phos 6 mg/mL; 1 mL lidocaine 10 mg/mL (1 %)  Consent was given by the patient. Immediately prior to procedure a time out was called to verify the correct patient, procedure, equipment, support staff and site/side marked as required. Patient was prepped and draped in the usual sterile fashion.          Diagnosis Plan   1. Arthritis of hand, left     2. Bilateral hand pain     3. Trigger thumb, right thumb     4. Arthritis of hand, right       Assessment/Plan:  Patient was seen and examined with Dr. Dorisann Garre.  Patient has clinical findings that are consistent with a right trigger thumb.  Discussed nature and progression of this condition as well as various treatment options.  She was amenable to proceed with a cortisone injection and tolerated it well.  In regards to her progressive hand arthritis, her right middle finger MP joint is the most symptomatic at current time.  We discussed that ultimately surgical intervention  would involve MP joint replacements with PIP fusion.  We discussed that although these are good procedures for pain relief she would lose mobility in her digits.  At present time, she was amenable to proceed with cortisone injection into her right MP joint as well.  She tolerated this procedure well.  We will see her back in 1 month for reevaluation.  Patient verbalized understanding and agreement with plan.  All questions and concerns addressed.    Darol Donnelly, PA

## 2020-11-02 ENCOUNTER — Encounter (HOSPITAL_BASED_OUTPATIENT_CLINIC_OR_DEPARTMENT_OTHER): Admitting: Student in an Organized Health Care Education/Training Program

## 2020-11-02 ENCOUNTER — Ambulatory Visit
Admit: 2020-11-02 | Discharge: 2020-11-02 | Payer: PRIVATE HEALTH INSURANCE | Attending: Student in an Organized Health Care Education/Training Program | Primary: Student in an Organized Health Care Education/Training Program

## 2020-11-02 ENCOUNTER — Inpatient Hospital Stay
Admit: 2020-11-02 | Payer: PRIVATE HEALTH INSURANCE | Primary: Student in an Organized Health Care Education/Training Program

## 2020-11-02 ENCOUNTER — Other Ambulatory Visit

## 2020-11-02 VITALS — BP 130/88 | HR 110 | Temp 98.7°F | Ht 62.99 in | Wt 151.6 lb

## 2020-11-02 DIAGNOSIS — M349 Systemic sclerosis, unspecified: Secondary | ICD-10-CM

## 2020-11-02 MED ORDER — oxyCODONE (Roxicodone) 10 mg immediate release tablet
10 | ORAL_TABLET | Freq: Every day | ORAL | 0 refills | 8.00000 days | Status: DC
Start: 2020-11-02 — End: 2020-11-17

## 2020-11-02 MED ORDER — baclofen (Lioresal) 10 mg tablet
10 | ORAL_TABLET | Freq: Two times a day (BID) | ORAL | 1 refills | Status: DC
Start: 2020-11-02 — End: 2021-02-23

## 2020-11-02 MED ORDER — amoxicillin (Amoxil) 500 mg capsule
500 | ORAL_CAPSULE | Freq: Once | ORAL | 0 refills | Status: AC
Start: 2020-11-02 — End: 2020-11-02

## 2020-11-02 NOTE — Assessment & Plan Note (Signed)
 Walking continuing to improve. Continuing to work on weaning oxycodone. Taking 10mg  once daily 2-3x/week. Will send refill for 28 day supply of oxycodone 10mg  daily (opioid contract completed in [place]). Continue lyrica 50mg  BID and baclofen 10mg  BID (refill sent to pharmacy).

## 2020-11-02 NOTE — Assessment & Plan Note (Signed)
 Symptoms currently well controlled on prednisone 5mg  (managed by rheumatology), plaquenil 200mg  daily and mycophenolate 1500mg  qAM/1000mg  qPM. Continues to follow closely with [name] in Rheum.

## 2020-11-02 NOTE — Assessment & Plan Note (Signed)
 Needs amoxicillin 2g prophylaxis prior to upcoming dental procedure. Sent to pt's pharmacy.

## 2020-11-02 NOTE — Assessment & Plan Note (Signed)
 Recommend compression stockings

## 2020-11-02 NOTE — Progress Notes (Signed)
 [facility]  [address]  [place], [place] [address]  TEL: [phone]    PATIENT: [name]  MRN: [id number]   DATE OF APT: 11/02/20   TIME: 4:09 PM  PCP: Baldemar Friday, MD    HISTORY OF PRESENT ILLNESS:   Ms. Easterly is a 59yoF with PMH of CREST, seronegative RA and DM2 who presents for follow up.     Things going well, walking is improving. Aches and pains are under control. Medications working well currently. Currently taking oxycodone 10mg  once daily for roughly 2-3 days per week, continuing to work on weaning. Continues taking baclofen 10mg  BID most days of the week (though not all).    Still seeing [name] in Rheumatology, currently on prednisone 5mg  daily (1/2 tablet) to keep the RA in her hands under control. Also remain on plaquenil.     Regarding scleroderma, she is scheduled for routine monitoring with TTE and PFTs this month.     Otherwise, doing well. Has minor concerns including isolated thinning of eyelashes of R eye (no other hair loss), a few areas of calcinosis of bilateral shins and varicose veins, though none particularly bothersome.       Review of Systems:   Pertinent positives and negatives as per HPI, otherwise a 13 point review of systems was negative.    PAST MEDICAL HISTORY:     Past Medical History:   Diagnosis Date   . Achilles rupture, left, sequela 09/04/2020   . Bilateral carpal tunnel syndrome    . History of venous thromboembolism 2006    Left upper extremity   . IgG4 deficiency (CMS/HCC)    . Liver fibrosis     Stage II   . Paget's disease of bone    . Peptic ulcer disease    . Scoliosis     s/p left L4-L5 transforaminal lumbar interbody fusion   . Spinal stenosis at L4-L5 level     s/p L4-L5 laminectomy wtih complete facetctomy with decompression (02/2020)     Patient Active Problem List   Diagnosis   . CVID (common variable immunodeficiency) (CMS/HCC)   . Gastroesophageal reflux disease without esophagitis   . IgG4 deficiency (CMS/HCC)   . Systemic sclerosis,  unspecified (CMS/HCC)   . Seronegative rheumatoid arthritis (CMS/HCC)   . Spinal stenosis, lumbar region, with neurogenic claudication   . Type 2 diabetes mellitus without complication (CMS/HCC)   . Left hip pain   . Long-term use of high-risk medication   . Abnormal liver function tests   . Hypogammaglobulinemia (CMS/HCC)   . Scoliosis   . Varicose veins of both lower extremities       HOME MEDICATIONS:     Current Outpatient Medications   Medication Instructions   . amoxicillin (AMOXIL) 2,000 mg, oral, Once, Prior to dental procedure   . [START ON 01/01/2021] baclofen (LIORESAL) 10 mg, oral, 2 times daily   . calcium carbonate-vitamin D3 500 mg-5 mcg (200 unit) tablet 1 tablet, oral, Daily   . cyanocobalamin (VITAMIN B-12) 100 mcg, oral, Daily   . famotidine (PEPCID) 20 mg, oral, Daily PRN   . hydrocortisone 0.5 % ointment 1 application, Topical, 2 times daily PRN   . hydroxychloroquine (PLAQUENIL) 200 mg, oral, Daily   . immune globulin, human, (Gammagard) infusion Receives infusion every 4 weeks, next dose 8/5   . Jardiance 10 mg, oral, Daily   . ketoconazole (NIZOral) 2 % cream 1 application, Topical, Daily   . lidocaine (Lidoderm) 5 %  patch 1 patch, topical (top), Daily, Remove & discard patch within 12 hours or as directed by MD.   . lidocaine (LMX) 4 % cream 1 application, Topical, 4 times daily PRN   . lisinopril 2.5 mg, oral, Daily   . metFORMIN XR (GLUCOPHAGE-XR) 2,000 mg, oral, Daily with breakfast, Do not crush, chew, or split.    . mycophenolate (Cellcept) 500 mg tablet 2 tablets, oral   . omeprazole (PRILOSEC) 20 mg, oral, 2 times daily   . oxyCODONE (ROXICODONE) 10 mg, oral, Daily   . predniSONE (DELTASONE) 10 mg, oral, Daily   . pregabalin (LYRICA) 50 mg, oral, 2 times daily   . traZODone (DESYREL) 100 mg, oral, Nightly PRN   . ursodiol (Actigall) 500 mg tablet 1 tablet, oral, 2 times daily        ALLERGIES:   No Known Allergies       VITALS AND PHYSICAL EXAM:   Objective   Vitals:   BP 130/88 (BP  Location: Right arm, Patient Position: Sitting, BP Cuff Size: Adult)   Pulse 110   Temp 37.1 C (98.7 F) (Oral)   Ht 1.6 m   Wt 68.8 kg   SpO2 96%   BMI 26.86 kg/m      Physical Exam  Vitals reviewed.   Constitutional:       General: She is not in acute distress.     Appearance: Normal appearance. She is not ill-appearing.   HENT:      Head: Normocephalic and atraumatic.      Mouth/Throat:      Mouth: Mucous membranes are moist.      Pharynx: No oropharyngeal exudate or posterior oropharyngeal erythema.   Eyes:      Conjunctiva/sclera: Conjunctivae normal.   Cardiovascular:      Rate and Rhythm: Normal rate and regular rhythm.      Heart sounds: No murmur heard.    No gallop.   Pulmonary:      Effort: Pulmonary effort is normal. No respiratory distress.      Breath sounds: Normal breath sounds. No wheezing or rales.   Abdominal:      General: Bowel sounds are normal. There is no distension.      Palpations: Abdomen is soft.      Tenderness: There is no abdominal tenderness.   Musculoskeletal:         General: No swelling or deformity. Normal range of motion.      Comments: MCP swelling of bilateral hands, greatest on 1st MCP, also some bony PIP swelling. No active synovitis appreciated. Hand/finger joints nontender   Skin:     General: Skin is warm and dry.      Coloration: Skin is not jaundiced.      Findings: No bruising or rash.   Neurological:      General: No focal deficit present.      Mental Status: She is alert and oriented to person, place, and time.      Motor: No weakness.      Gait: Gait normal.   Psychiatric:         Mood and Affect: Mood normal.         Behavior: Behavior normal.           LABS:     Lab Results   Component Value Date    GLUCOSE 110 10/17/2020    CALCIUM 9.4 10/17/2020    NA 137 10/17/2020    K 3.6 10/17/2020  CO2 26 10/17/2020    CL 107 10/17/2020    BUN 14 10/17/2020    CREATININE 0.64 10/17/2020      Lab Results   Component Value Date    BUN 14 10/17/2020      Lab Results    Component Value Date    CREATININE 0.64 10/17/2020      Lab Results   Component Value Date    ALT 35 10/17/2020    AST 24 10/17/2020    GGT 268 (H) 10/01/2020    ALKPHOS 156 (H) 10/17/2020    BILITOT 0.5 10/17/2020      Lab Results   Component Value Date    CALCIUM 9.4 10/17/2020    No results found for: IRON, TIBC, FERRITIN   No results found for: TSH   Lab Results   Component Value Date    WBC 7.0 10/17/2020    HGB 13.9 10/17/2020    HCT 44.7 10/17/2020    PLT 263 10/17/2020    ALT 35 10/17/2020    AST 24 10/17/2020    NA 137 10/17/2020    K 3.6 10/17/2020    CL 107 10/17/2020    CREATININE 0.64 10/17/2020    BUN 14 10/17/2020    CO2 26 10/17/2020    INR 1.00 10/01/2020           ASSESSMENT & PLAN:   Overall doing well. Walking and pain is improving and well controlled on current regimen. Continue to counsel on weaning oxycodone as tolerated and trying to minimize opioid use as much as possible, patient voices understanding. RA symptoms better controlled on prednisone 5mg  (managed by rheumatology). While tapering opioids, will plan for f/up in ~3 months (or sooner PRN).     [place] was seen today for follow-up.  Systemic sclerosis, unspecified (CMS/HCC)  Assessment & Plan:  Symptoms well controlled, follows with [name] in Rheumatology. Scheduled for routine TTE and PFT for monitoring, not having any active cardiac or pulmonary symptoms.   IgG4 deficiency (CMS/HCC)  Assessment & Plan:  Needs amoxicillin 2g prophylaxis prior to upcoming dental procedure. Sent to pt's pharmacy.  Orders:  -     amoxicillin (Amoxil) 500 mg capsule; Take 4 capsules (2,000 mg) by mouth 1 (one) time for 1 dose. Prior to dental procedure  Left hip pain  -     oxyCODONE (Roxicodone) 10 mg immediate release tablet; Take 1 tablet (10 mg) by mouth in the morning.  Spinal stenosis, lumbar region, with neurogenic claudication  Assessment & Plan:   Walking continuing to improve. Continuing to work on weaning oxycodone. Taking 10mg  once daily  2-3x/week. Will send refill for 28 day supply of oxycodone 10mg  daily (opioid contract completed in [place]). Continue lyrica 50mg  BID and baclofen 10mg  BID (refill sent to pharmacy).  Orders:  -     oxyCODONE (Roxicodone) 10 mg immediate release tablet; Take 1 tablet (10 mg) by mouth in the morning.  -     baclofen (Lioresal) 10 mg tablet; Take 1 tablet (10 mg) by mouth in the morning and at bedtime.  Seronegative rheumatoid arthritis (CMS/HCC)  Assessment & Plan:  Symptoms currently well controlled on prednisone 5mg  (managed by rheumatology), plaquenil 200mg  daily and mycophenolate 1500mg  qAM/1000mg  qPM. Continues to follow closely with [name] in Rheum.  Orders:  -     oxyCODONE (Roxicodone) 10 mg immediate release tablet; Take 1 tablet (10 mg) by mouth in the morning.  Asymptomatic varicose veins of both lower extremities  Assessment & Plan:  Recommend compression stockings.

## 2020-11-02 NOTE — Assessment & Plan Note (Signed)
 Symptoms well controlled, follows with [name] in Rheumatology. Scheduled for routine TTE and PFT for monitoring, not having any active cardiac or pulmonary symptoms.

## 2020-11-04 ENCOUNTER — Other Ambulatory Visit (HOSPITAL_BASED_OUTPATIENT_CLINIC_OR_DEPARTMENT_OTHER): Admitting: Physician Assistant

## 2020-11-04 NOTE — Telephone Encounter (Signed)
 Last visit 09/03/20  F/u 12/31/20  Last labs 10/17/20  Dx Seronegative rheumatoid arthritis

## 2020-11-08 ENCOUNTER — Ambulatory Visit
Payer: PRIVATE HEALTH INSURANCE | Attending: Hand Surgery | Primary: Student in an Organized Health Care Education/Training Program

## 2020-11-09 ENCOUNTER — Encounter: Payer: PRIVATE HEALTH INSURANCE | Primary: Student in an Organized Health Care Education/Training Program

## 2020-11-09 ENCOUNTER — Ambulatory Visit
Admit: 2020-11-09 | Discharge: 2020-11-09 | Payer: PRIVATE HEALTH INSURANCE | Attending: Registered Nurse | Primary: Student in an Organized Health Care Education/Training Program

## 2020-11-09 ENCOUNTER — Inpatient Hospital Stay
Admit: 2020-11-09 | Discharge: 2020-11-09 | Payer: PRIVATE HEALTH INSURANCE | Primary: Student in an Organized Health Care Education/Training Program

## 2020-11-09 ENCOUNTER — Other Ambulatory Visit

## 2020-11-09 VITALS — BP 108/68 | HR 76 | Ht 64.0 in | Wt 150.8 lb

## 2020-11-09 DIAGNOSIS — K219 Gastro-esophageal reflux disease without esophagitis: Secondary | ICD-10-CM

## 2020-11-09 DIAGNOSIS — K922 Gastrointestinal hemorrhage, unspecified: Secondary | ICD-10-CM

## 2020-11-09 LAB — COMPLETE PULMONARY FUNCTION TESTING
DLCO Actual Pre-BD (Uncorrected): 20.26 mL/min/mmHg
DLCO Actual Pre-BD: 20.26 mL/min/mmHg
DLCO Pre-BD % of Predicted (Uncorrected): 104 %
DLCO Pre-BD % of Predicted: 102 %
FEV! Pre-BD % of Predicted: 120 %
FEV1 Actual Post-BD: 3.13 L
FEV1 Actual Pre-BD: 2.97 L
FEV1 Post-BD % of Predicted: 126 %
FEV1/FVC Actual Post-BD: 80 %
FEV1/FVC Actual Pre-BD: 75 %
FVC Actual Post-BD: 3.89 L
FVC Actual Pre-BD: 3.95 L
FVC Post-BD % of Predicted: 125 %
FVC Pre-BD % of Predicted: 127 %
RV Actual Pre-BD: 1.81 L
RV Pre-BD % of Predicted: 112 %
RV/TLC Actual Pre-BD: 32 %
TLC Actual Pre-BD: 5.69 L
TLC Pre-BD % of Predicted: 115 %

## 2020-11-09 MED ORDER — ursodiol (Actigall) 500 mg tablet
500 | ORAL_TABLET | Freq: Two times a day (BID) | ORAL | 3 refills | Status: DC
Start: 2020-11-09 — End: 2021-01-31

## 2020-11-09 NOTE — Patient Instructions (Addendum)
 Lab in 6 months  Cont ursodiol 500mg  BID  Appt in 6 months with Dr Kirtland Bouchard  Low fibre diet during diarrhea flare

## 2020-11-09 NOTE — Progress Notes (Signed)
 [facility]  GASTROENTEROLOGY-[place], [place]   Follow Up Visit Note - Adult    MRN: [id number]  Patient Name: [name]    DOB:  [birth year]  Gender:    AGE:  60 y.o.  Encounter Date:      Referring Physician: No ref. provider found   Primary Care Provider: Baldemar Friday, MD  Primary Language:   Lenox Ponds        Summary of Illness:  [name] 60 y.o. female with hx of scleroderma (CREST), IgG4 deficiency, RA, on chronic steroids with prior treatment with Rituxan, GI bleed in the setting of diverticulosis, and gastroparesis who presents for follow up. Patient was admitted in 05/2018 to local hospital after episodes of melena. She was found to be anemic and underwent EGD and colonoscopy which showed a few gastric erosions and diverticulosis but no active bleeding. No recurrence of  bleeding since. 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     Interim history 11/09/2020: diarrhea described as frequent BM 4 times, muddy, urgency. Tried avoid fried food or diary, but not found specific trigger. High fibre diet. She had vigorous weight loss from size XXL down to size small. Stable weight from last 6 months    Prednisone stopped 10mg  in 07/2019  Currently resumed prednisone 5mg  for hand/wrist pain  She does not have weight gain or moon face as S/E  Moving to [place] for good in a month. Would like to cont follow up different specialty at [facility].  Currently on ursodiol 1000mg , current weight 68kg, optimum dose 884mg  -1020mg   Cont cellcept, prednisone and ursodiol  Avoid metamucil, fibre during diarrhea flare up.      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 She has been on prednisone for 10 years and she was  put to wean. She is currently on mycophenolate. She remains  Omeprazole 40mg  daily which she reports consistent use.          Interval History:  HPI    Allergies  No Known Allergies     Medications  Current Outpatient Medications   Medication Instructions   . [START ON 01/01/2021] baclofen (LIORESAL) 10 mg, oral, 2 times daily   . calcium carbonate-vitamin D3 500 mg-5 mcg (200 unit) tablet 1 tablet, oral, Daily   . cyanocobalamin (VITAMIN B-12) 100 mcg, oral, Daily   . famotidine (PEPCID) 20 mg, oral, Daily PRN   . hydrocortisone 0.5 % ointment 1 application, Topical, 2 times daily PRN   . hydroxychloroquine (Plaquenil) 200 mg tablet TAKE 1 TABLET BY MOUTH EVERY DAY WITH FOOD OR MILK   . immune globulin, human, (Gammagard) infusion Receives infusion every 4 weeks, next dose 8/5   . Jardiance 10 mg, oral, Daily   . ketoconazole (NIZOral) 2 % cream 1 application, Topical, Daily   . lidocaine (Lidoderm) 5 % patch 1 patch, topical (top), Daily, Remove & discard patch within 12 hours or as directed by MD.   . lidocaine (LMX) 4 % cream 1 application, Topical, 4 times daily PRN   . lisinopril 2.5 mg, oral, Daily   . metFORMIN XR (GLUCOPHAGE-XR) 2,000 mg, oral, Daily with breakfast, Do not crush, chew, or split.    . mycophenolate (Cellcept) 500 mg tablet 2 tablets, oral   . omeprazole (  PRILOSEC) 20 mg, oral, 2 times daily   . oxyCODONE (ROXICODONE) 10 mg, oral, Daily   . pregabalin (LYRICA) 50 mg, oral, 2 times daily   . traZODone (DESYREL) 100 mg, oral, Nightly PRN   . ursodiol (Actigall) 500 mg tablet 1 tablet, oral, 2 times daily        Past Medical History  Past Medical History:   Diagnosis Date   . Achilles rupture, left, sequela 09/04/2020   . Bilateral carpal tunnel syndrome    . History of venous thromboembolism 2006    Left upper extremity   . IgG4 deficiency (CMS/HCC)    . Liver fibrosis     Stage II   . Paget's disease of bone    . Peptic ulcer disease    . Scoliosis     s/p left L4-L5 transforaminal lumbar interbody fusion   . Spinal stenosis at L4-L5 level     s/p L4-L5 laminectomy wtih complete facetctomy with decompression  (02/2020)        Past Surgical History  Past Surgical History:   Procedure Laterality Date   . CARPAL TUNNEL RELEASE Left 01/2011   . CHOLECYSTECTOMY  1990   . FOOT SURGERY Left 2008    Plating of left 3rd metatarsal   . HAND SURGERY  2013   . HAND SURGERY  2015   . HARDWARE REMOVAL Right 2006    shoulder due to infection   . HERNIA REPAIR  1990   . LUMBAR FUSION Left 02/2020    L4-L5 transforaminal lumbar interbody fusion   . LUMBAR LAMINECTOMY  02/2020    L4-L5, with complete left facetectomy and decompression   . ORIF FOOT FRACTURE Left     Left 4th metatarsal   . OSTEOTOMY AND ULNAR SHORTENING Left 1994   . OTHER SURGICAL HISTORY Left 05/2019    Hamstring repair   . REVERSE TOTAL SHOULDER ARTHROPLASTY Left 03/07/2016   . ROTATOR CUFF REPAIR Right 2006   . SHOULDER SURGERY Right 2007    Recurrent infection   . TOTAL KNEE ARTHROPLASTY Bilateral 2009   . TOTAL KNEE ARTHROPLASTY Right 2010    replacement of prior arthroplasty        Family History  Family History   Problem Relation Name Age of Onset   . Lung cancer Mother     . Diabetes type II Father     . Heart attack Father     . Other (systemic sclerosis with gangrene of fingers) Brother     . Lung cancer Maternal Grandmother     . Brain Aneurysm Maternal Grandfather     . Lung cancer Paternal Grandmother     . Melanoma Mother's Brother          Social History  Social History     Tobacco Use   . Smoking status: Never Smoker   . Smokeless tobacco: Never Used   Substance Use Topics   . Alcohol use: Yes     Comment: Rare, social drinker   . Drug use: Never        Review of Systems  10 systems reviewed and negative except as stated in HPI.    Physical Exam  Visit Vitals  BP 108/68 (BP Location: Left arm, Patient Position: Sitting, BP Cuff Size: Adult)   Pulse 76              Constitutional: NAD  Skin: No bruises or petechiae seen. No rashes or lesions.  HEENT: Atraumatic. Anicteric sclera.  Conjunctivae are without injection or pallor. Oropharynx shows moist mucosa.    Cardiac: Regular, without murmurs, gallops, or rubs.  Respiratory: Lungs are clear to posterior auscultation bilaterally.  Abdomen: Soft and non-tender. No obvious hepatosplenomegaly. No masses, guarding or rebound. Normal bowel sounds.  Rectal: Deferred.  Extremities: Warm and well-perfused and without lower extremity edema.  Neurologic: Fully alert and oriented; no grossly apparent focal deficits.      Lab Tests Reviewed:  Recent Results (from the past 1008 hour(s))   Albumin    Collection Time: 10/01/20 12:38 PM   Result Value Ref Range    Albumin 4.3 3.2 - 5.0 g/dL   Alkaline phosphatase    Collection Time: 10/01/20 12:38 PM   Result Value Ref Range    Alkaline phosphatase 207 (H) 30 - 130 U/L   ALT    Collection Time: 10/01/20 12:38 PM   Result Value Ref Range    ALT 26 0 - 55 U/L   AST    Collection Time: 10/01/20 12:38 PM   Result Value Ref Range    AST 22 6 - 42 U/L   Basic metabolic panel    Collection Time: 10/01/20 12:38 PM   Result Value Ref Range    Sodium 140 135 - 146 mmol/L    Potassium 4.1 3.6 - 5.2 mmol/L    Chloride 107 98 - 110 mmol/L    CO2 (Bicarbonate) 23 20 - 32 mmol/L    Anion Gap 10 3 - 14 mmol/L    BUN 20 6 - 24 mg/dL    Creatinine 5.28 4.13 - 1.30 mg/dL    eGFRcr 93 >=24 MW/NUU/7.25D*6    Glucose 208 (H) 70 - 139 mg/dL    Fasting? Unknown     Calcium 9.5 8.5 - 10.5 mg/dL   Bilirubin, total    Collection Time: 10/01/20 12:38 PM   Result Value Ref Range    Bilirubin, total 0.5 0.2 - 1.2 mg/dL   GGT    Collection Time: 10/01/20 12:38 PM   Result Value Ref Range    GGT 268 (H) 5 - 55 IU/L   IgG, IgA, IgM    Collection Time: 10/01/20 12:38 PM   Result Value Ref Range    IgG 912 700 - 1,600 mg/dL    IgA 64.4 (L) 03.4 - 400.0 mg/dL    IgM 742 (H) 40 - 595 mg/dL   IgG 1, 2, 3, and 4    Collection Time: 10/01/20 12:38 PM   Result Value Ref Range    Immunoglobulin G Subclass 1    434 382 - 929 mg/dL    Immunoglobulin G Subclass 2 325 241 - 700 mg/dL    Immunoglobulin G Subclass 3 21 (L) 22 - 178  mg/dL    Immunoglobulin G Subclass 4 13.7 4 - 86 mg/dL    Immunoglobulin G, Serum 831 600 - 1640 mg/dL   CBC w/ Differential    Collection Time: 10/01/20 12:38 PM   Result Value Ref Range    WBC 11.8 (H) 4.0 - 11.0 K/uL    RBC 4.43 3.70 - 5.20 M/uL    Hemoglobin 13.2 11.0 - 16.0 g/dL    Hematocrit 63.8 75.6 - 47.0 %    MCV 92.8 80.0 - 100.0 fL    MCH 29.8 26.0 - 34.0 pg    MCHC 32.1 31.0 - 37.0 g/dL    RDW-CV 43.3 29.5 - 18.8 %    RDW-SD 46.9 35.0 - 51.0 fL  Platelets 276 150 - 400 K/uL    MPV 10.4 9.1 - 12.4 fL    Neutrophil % 89.1 %    Lymphocyte % 6.2 %    Monocytes % 3.3 %    Eosinophils % 0.3 %    Basophils % 0.4 %    Immature Granulocytes % 0.7 %    NRBC % 0.0 0.0 - 0.0 %    Neutrophils Absolute 10.49 (H) 1.50 - 7.95 K/uL    Lymphocytes Absolute 0.73 0.70 - 4.00 K/uL    Monocytes Absolute 0.39 0.36 - 0.77 K/uL    Eosinophils Absolute 0.03 0.00 - 0.50 K/uL    Basophils Absolute 0.05 0.00 - 0.22 K/uL    Immature Granulocytes Absolute 0.08 0.00 - 0.10 K/uL    NRBC Absolute 0.00 0.00 - 2.00 K/uL   Protime-INR    Collection Time: 10/01/20 12:38 PM   Result Value Ref Range    Protime 11.5 9.7 - 14.0 seconds    INR 1.00 See Interpretive Comment   Basic metabolic panel    Collection Time: 10/17/20  9:15 AM   Result Value Ref Range    Sodium 137 135 - 146 mmol/L    Potassium 3.6 3.6 - 5.2 mmol/L    Chloride 107 98 - 110 mmol/L    CO2 (Bicarbonate) 26 20 - 32 mmol/L    Anion Gap 4 3 - 14 mmol/L    BUN 14 6 - 24 mg/dL    Creatinine 3.76 2.83 - 1.30 mg/dL    eGFRcr 151 >=76 HY/WVP/7.10G*2    Glucose 110 70 - 110 mg/dL    Fasting? Unknown     Calcium 9.4 8.5 - 10.5 mg/dL   Sedimentation rate, automated    Collection Time: 10/17/20  9:15 AM   Result Value Ref Range    Sed Rate 13 0 - 30 mm/hr   C-reactive protein    Collection Time: 10/17/20  9:15 AM   Result Value Ref Range    CRP <0.29 (L) 0.29 - 0.80 mg/dL   Hepatic function panel    Collection Time: 10/17/20  9:15 AM   Result Value Ref Range    Albumin 3.9 3.2 - 5.0  g/dL    Bilirubin, total 0.5 0.2 - 1.2 mg/dL    Bilirubin, direct 0.1 0.0 - 0.5 mg/dL    Alkaline phosphatase 156 (H) 30 - 130 U/L    AST 24 6 - 42 U/L    ALT 35 0 - 55 U/L    Protein, total 7.4 6.0 - 8.4 g/dL   CBC w/ Differential    Collection Time: 10/17/20  9:15 AM   Result Value Ref Range    WBC 7.0 4.0 - 11.0 K/uL    RBC 4.71 3.70 - 5.20 M/uL    Hemoglobin 13.9 11.0 - 16.0 g/dL    Hematocrit 69.4 85.4 - 47.0 %    MCV 94.9 80.0 - 100.0 fL    MCH 29.5 26.0 - 34.0 pg    MCHC 31.1 31.0 - 37.0 g/dL    RDW-CV 62.7 03.5 - 00.9 %    RDW-SD 48.4 35.0 - 51.0 fL    Platelets 263 150 - 400 K/uL    MPV 11.0 9.1 - 12.4 fL    Neutrophil % 76.5 %    Lymphocyte % 16.8 %    Monocytes % 5.2 %    Eosinophils % 0.6 %    Basophils % 0.6 %    Immature Granulocytes % 0.3 %  NRBC % 0.0 0.0 - 0.0 %    Neutrophils Absolute 5.32 1.50 - 7.95 K/uL    Lymphocytes Absolute 1.17 0.70 - 4.00 K/uL    Monocytes Absolute 0.36 0.36 - 0.77 K/uL    Eosinophils Absolute 0.04 0.00 - 0.50 K/uL    Basophils Absolute 0.04 0.00 - 0.22 K/uL    Immature Granulocytes Absolute 0.02 0.00 - 0.10 K/uL    NRBC Absolute 0.00 0.00 - 2.00 K/uL   SST Gold Top    Collection Time: 10/17/20  9:15 AM   Result Value Ref Range    Extra Tube Hold for add-ons.               Assessment & Plan:    60 yo with longstanding elevated ALK (1.7 xULN) and positive ANA 1:1280 with underlying multiple autoimmune diseases on/off chronic prednisone but recently resumed prednisone 5mg . There is no sign of fibrosis or cirrhosis    Prednisone stopped 10mg  in 07/2019  Currently resumed prednisone 5mg  for hand/wrist pain  She does not have weight gain or moon face as S/E  Moving to [place] for good in a month. Would like to cont follow up different specialty at [facility].  Currently on ursodiol 1000mg , current weight 68kg, optimum dose 884mg  -1020mg   Cont cellcept, prednisone and ursodiol  At next visit, for her IgG disease, we can reevaluate if ursodiol plays a role in her LFTs  improvement. Given she is moving to another state, she should cont for now.  Avoid metamucil, fibre during diarrhea flare up.      Armany Mano Callie Fielding, FNP, MPH  Nurse Practitioner, [facility]  Division of Gastroenterology/Hepatology  [facility]  P [phone]  F [phone]  [address] [place], [place] [address]

## 2020-11-13 ENCOUNTER — Other Ambulatory Visit (HOSPITAL_BASED_OUTPATIENT_CLINIC_OR_DEPARTMENT_OTHER): Admitting: Rheumatology

## 2020-11-13 ENCOUNTER — Encounter (HOSPITAL_BASED_OUTPATIENT_CLINIC_OR_DEPARTMENT_OTHER): Admitting: Gastroenterology

## 2020-11-15 NOTE — Telephone Encounter (Signed)
 Last apt was with Dr [name] 09/03/20 plan to continue 10mg  daily  Next apt is 12/31/20  Last labs were 10/17/20

## 2020-11-16 ENCOUNTER — Ambulatory Visit (HOSPITAL_BASED_OUTPATIENT_CLINIC_OR_DEPARTMENT_OTHER): Admitting: Registered Nurse

## 2020-11-17 ENCOUNTER — Telehealth (HOSPITAL_BASED_OUTPATIENT_CLINIC_OR_DEPARTMENT_OTHER)

## 2020-11-17 ENCOUNTER — Other Ambulatory Visit (HOSPITAL_BASED_OUTPATIENT_CLINIC_OR_DEPARTMENT_OTHER): Admitting: Student in an Organized Health Care Education/Training Program

## 2020-11-17 MED ORDER — oxyCODONE (Roxicodone) 10 mg immediate release tablet
10 | ORAL_TABLET | Freq: Every day | ORAL | 0 refills | 8.00000 days | Status: DC
Start: 2020-11-17 — End: 2020-12-08

## 2020-11-17 NOTE — Telephone Encounter (Signed)
 LVMx1

## 2020-11-17 NOTE — Telephone Encounter (Addendum)
 PMP reviewed, no other prescribers or unexpected opioid prescriptions  56/28 day supply  Last filled/due:  09/15/20 per PMP, but was ordered on 8/02 and was never filled.   Next due on/after: due today for refill    Last appt: 11/02/20  Next appt: 12/16/20  Last UDS: 06/08/20  Last contract: 06/08/20    **see BLUE STICKY NOTE for these dates, please update manually if done recently.    Refill pended for PCP

## 2020-11-17 NOTE — Telephone Encounter (Signed)
 Patient is calling to speak with [name].

## 2020-11-17 NOTE — Telephone Encounter (Signed)
 oxyCODONE (Roxicodone) 10 mg immediate release tablet,   Pt wants script sent to:  [id number] [place] [facility], [place] - [phone].   Thank you

## 2020-11-18 NOTE — Telephone Encounter (Signed)
 Patient LVM to speak with [name] directly

## 2020-11-19 NOTE — Telephone Encounter (Signed)
 Added to the schedule 9/2 at 12PM with [name].

## 2020-11-29 ENCOUNTER — Telehealth (HOSPITAL_BASED_OUTPATIENT_CLINIC_OR_DEPARTMENT_OTHER)

## 2020-11-29 NOTE — Telephone Encounter (Signed)
 Quick note    Faxed updated order to [facility] requesting that upcoming infusion be given on 12/09/20 and the one after that be given 5 weeks post discharge per provider/pt request.     Josiah Lobo, [name]

## 2020-11-30 ENCOUNTER — Telehealth (HOSPITAL_BASED_OUTPATIENT_CLINIC_OR_DEPARTMENT_OTHER): Admitting: Rheumatology

## 2020-11-30 NOTE — Telephone Encounter (Signed)
 Patient is following up from convo last week with [facility] & [name] requesting standing infusion and discharge order to be sent to [facility] for date of 9/8. [facility] has not received the upcoming order yet. Advised patient, I send a message to the clinic checking on order status.   Patient confirmed appt with [name] on 9/2 @ 12 PM.   CB # [phone]  Thank you

## 2020-12-02 NOTE — Telephone Encounter (Signed)
 Faxing.

## 2020-12-03 ENCOUNTER — Encounter (HOSPITAL_BASED_OUTPATIENT_CLINIC_OR_DEPARTMENT_OTHER): Admitting: Rheumatology

## 2020-12-03 ENCOUNTER — Other Ambulatory Visit

## 2020-12-03 ENCOUNTER — Ambulatory Visit
Admit: 2020-12-03 | Discharge: 2020-12-03 | Payer: PRIVATE HEALTH INSURANCE | Attending: Rheumatology | Primary: Student in an Organized Health Care Education/Training Program

## 2020-12-03 VITALS — BP 113/71 | HR 96 | Ht 63.78 in | Wt 150.8 lb

## 2020-12-03 DIAGNOSIS — M349 Systemic sclerosis, unspecified: Secondary | ICD-10-CM

## 2020-12-03 NOTE — Assessment & Plan Note (Addendum)
 Her Raynaud's and skin disease are stable.  She continues to have esophageal reflux despite proton pump inhibitor therapy.  She has had 2 episodes of diarrhea in the last several months.  Both resolved.  She is followed by GI.  Of note she will be moving to [facility] area in 2 weeks.  She will travel here for medical appointments until she establishes care there.  I provided the name of Dr. Lujean Amel in [facility] and Drs. Stacey Drain and Bonnita Nasuti at [facility] in [place] to research and potentially reach out to.  She can call us anytime if she has a problem.

## 2020-12-03 NOTE — Progress Notes (Signed)
 [facility] RHEUMATOLOGY  [facility] Rheumatology  [address]  [place] [address]  [place] [address]  Dept: [phone]  Dept Fax: [phone]     Patient ID: [name] is a 60 y.o. female who presents for No chief complaint on file..    Assessment/Plan   Systemic sclerosis, unspecified (CMS/HCC)  Assessment & Plan:  Her Raynaud's and skin disease are stable.  She continues to have esophageal reflux despite proton pump inhibitor therapy.  She has had 2 episodes of diarrhea in the last several months.  Both resolved.  She is followed by GI.  Of note she will be moving to [facility] area in 2 weeks.  She will travel here for medical appointments until she establishes care there.  I provided the name of Dr. Lujean Amel in [facility] and Drs. Stacey Drain and Bonnita Nasuti at [facility] in [place] to research and potentially reach out to.  She can call us anytime if she has a problem.  IgG4 deficiency (CMS/HCC)  Assessment & Plan:  Continue monthly IVIG to prevent severe infection while on immunosuppressants.  No recent infections.  Seronegative rheumatoid arthritis (CMS/HCC)  Assessment & Plan:  She has been doing better.  She still has some pain and swelling but much improved.  She saw Dr. [name] who gave some steroid injections which helped.  She is down to 5 mg prednisone and is tolerating cellcept 1500 mg AM and 1000 mg PM.    Long-term use of high-risk medication      Subjective   She reports that she is doing OK today.  She still is having some pain in her hands but she recently visited Dr. [name] who provided her with some steroid injections and she is due to see him again in a couple weeks for her other hand.  She has more mobility and less pain since then.  She is down to 5 mg of prednisone and is taking CellCept 1500 mg in the morning and 1000 mg in the evening.  She is taking it on empty stomach.  She has not noted any side effects.  She has not had any recent  infections and continues to get monthly IVIG infusions.  She has had 2 episodes of abdominal cramping and diarrhea but they resolved and she has not had any for the last several weeks.  She reminds me today that she will be moving to [facility] in just a few weeks.  She will eventually find a rheumatologist there but is interested in continuing care here in the meantime and will travel for appointments as necessary.  She did have 1 episode of severe back pain that lasted several days but then resolved completely and has not recurred.  She is able to walk around without too much difficulty although she is still building back strength in her legs.  No new complaints and no recent infections.      Review of Systems   Constitutional: Negative for chills, fatigue and fever.   HENT: Negative for hearing loss, mouth sores, sinus pain and sore throat.    Eyes: Negative for pain, redness and visual disturbance.   Respiratory: Negative for cough and shortness of breath.    Cardiovascular: Negative for chest pain and palpitations.   Gastrointestinal: Positive for diarrhea.   Genitourinary: Negative for dysuria and hematuria.   Skin: Negative for rash.   Neurological: Negative for dizziness, weakness, numbness and headaches.   Psychiatric/Behavioral:  Negative for dysphoric mood and sleep disturbance. The patient is not nervous/anxious.        Objective    Visit Vitals  BP 113/71   Pulse 96   Ht 1.62 m   Wt 68.4 kg   SpO2 96%   BMI 26.06 kg/m   BSA 1.75 m       Physical Exam  Constitutional:       General: She is not in acute distress.  HENT:      Mouth/Throat:      Mouth: Mucous membranes are moist.   Eyes:      General: No scleral icterus.  Skin:     Findings: No rash.   Neurological:      General: No focal deficit present.      Mental Status: She is oriented to person, place, and time.      Gait: Gait abnormal.   Psychiatric:         Mood and Affect: Mood normal.

## 2020-12-03 NOTE — Assessment & Plan Note (Signed)
 Continue monthly IVIG to prevent severe infection while on immunosuppressants.  No recent infections.

## 2020-12-03 NOTE — Assessment & Plan Note (Signed)
 She has been doing better.  She still has some pain and swelling but much improved.  She saw Dr. [name] who gave some steroid injections which helped.  She is down to 5 mg prednisone and is tolerating cellcept 1500 mg AM and 1000 mg PM.

## 2020-12-08 ENCOUNTER — Other Ambulatory Visit (HOSPITAL_BASED_OUTPATIENT_CLINIC_OR_DEPARTMENT_OTHER): Admitting: Student in an Organized Health Care Education/Training Program

## 2020-12-08 ENCOUNTER — Telehealth (HOSPITAL_BASED_OUTPATIENT_CLINIC_OR_DEPARTMENT_OTHER): Admitting: Student in an Organized Health Care Education/Training Program

## 2020-12-08 NOTE — Telephone Encounter (Signed)
 Sending refill early by 5 days as patient is going out of state* for fill date: 12/10/20 (originally due 12/15/20)  Should be due for refill Oct 12 the following month  Pended for Dr. Dorene Grebe to sign   Clifton James, PA

## 2020-12-08 NOTE — Telephone Encounter (Signed)
 Pt is out of some med next week and pt is leaving on sat to [place].      She will provide the new pharmacy address next week to transfer her medication.     Thanks.

## 2020-12-08 NOTE — Telephone Encounter (Signed)
 PMP checked  Oxycodone 10mg  28/28 day supply filled on 11/17/20 - due for refill on Wed 12/15/20 next week  Dr. Chawla/Schmitt:  Patient is leaving this Saturday out of state  Are you okay with filling this early? If we fill for Friday Sep 9, it will be 5 days early    Please advise  [name], [place]

## 2020-12-08 NOTE — Telephone Encounter (Signed)
 Called pt and set up the appt. Thank you!  [name]

## 2020-12-08 NOTE — Telephone Encounter (Signed)
 pt is returning call. Pt stated missed the call from [facility] regarding new PCP and schedule the appt.     Please cb. Thanks.   [phone]

## 2020-12-09 MED ORDER — oxyCODONE (Roxicodone) 10 mg immediate release tablet
10 | ORAL_TABLET | Freq: Every day | ORAL | 0 refills | 8.00000 days | Status: DC
Start: 2020-12-09 — End: 2021-01-04

## 2020-12-09 MED ORDER — lidocaine (Lidoderm) 5 % patch
5 | MEDICATED_PATCH | Freq: Every day | TOPICAL | 1 refills | 30.00000 days | Status: DC
Start: 2020-12-09 — End: 2021-02-28

## 2020-12-09 MED ORDER — ketoconazole (NIZOral) 2 % cream
2 | Freq: Every day | TOPICAL | 11 refills | Status: DC
Start: 2020-12-09 — End: 2021-01-04

## 2020-12-10 ENCOUNTER — Ambulatory Visit
Payer: PRIVATE HEALTH INSURANCE | Attending: Hand Surgery | Primary: Student in an Organized Health Care Education/Training Program

## 2020-12-10 ENCOUNTER — Ambulatory Visit
Admit: 2020-12-10 | Discharge: 2020-12-10 | Payer: PRIVATE HEALTH INSURANCE | Attending: Hand Surgery | Primary: Student in an Organized Health Care Education/Training Program

## 2020-12-10 ENCOUNTER — Encounter (HOSPITAL_BASED_OUTPATIENT_CLINIC_OR_DEPARTMENT_OTHER): Admitting: Hand Surgery

## 2020-12-10 ENCOUNTER — Encounter

## 2020-12-10 ENCOUNTER — Other Ambulatory Visit

## 2020-12-10 DIAGNOSIS — M19041 Primary osteoarthritis, right hand: Secondary | ICD-10-CM

## 2020-12-10 NOTE — Progress Notes (Signed)
 [facility] Note    Patient Name: [name]  MRN: [id number]    Chief Complaint:   1. Right trigger thumb  2. Bilateral hand pain    History of Present Illness:  [name] is a 60 year old female well-known to Dr. [name] with history of scleroderma and seronegative RA who presents here today for bilateral hand pain.  She was last seen on 10/22/2020 at which time she received a cortisone injection for her right trigger thumb.  She states this provided good relief, however she still some pain and  clicking.  She is interested in having another injection today.  Additionally, she has had worsening pain at the bilateral index finger MP joints.  She otherwise has no new concerns today. Of note, she is moving to [place].     Allergies:  No Known Allergies    Medical History:  Past Medical History:   Diagnosis Date   . Achilles rupture, left, sequela 09/04/2020   . Bilateral carpal tunnel syndrome    . History of venous thromboembolism 2006    Left upper extremity   . IgG4 deficiency (CMS/HCC)    . Liver fibrosis     Stage II   . Paget's disease of bone    . Peptic ulcer disease    . Scoliosis     s/p left L4-L5 transforaminal lumbar interbody fusion   . Spinal stenosis at L4-L5 level     s/p L4-L5 laminectomy wtih complete facetctomy with decompression (02/2020)       Surgical History:  Past Surgical History:   Procedure Laterality Date   . CARPAL TUNNEL RELEASE Left 01/2011   . CHOLECYSTECTOMY  1990   . FOOT SURGERY Left 2008    Plating of left 3rd metatarsal   . HAND SURGERY  2013   . HAND SURGERY  2015   . HARDWARE REMOVAL Right 2006    shoulder due to infection   . HERNIA REPAIR  1990   . LUMBAR FUSION Left 02/2020    L4-L5 transforaminal lumbar interbody fusion   . LUMBAR LAMINECTOMY  02/2020    L4-L5, with complete left facetectomy and decompression   . ORIF FOOT FRACTURE Left     Left 4th metatarsal   . OSTEOTOMY AND ULNAR SHORTENING Left 1994   . OTHER SURGICAL HISTORY Left  05/2019    Hamstring repair   . REVERSE TOTAL SHOULDER ARTHROPLASTY Left 03/07/2016   . ROTATOR CUFF REPAIR Right 2006   . SHOULDER SURGERY Right 2007    Recurrent infection   . TOTAL KNEE ARTHROPLASTY Bilateral 2009   . TOTAL KNEE ARTHROPLASTY Right 2010    replacement of prior arthroplasty       Current Medications:  Current Outpatient Medications   Medication Sig Dispense Refill   . [START ON 01/01/2021] baclofen (Lioresal) 10 mg tablet Take 1 tablet (10 mg) by mouth in the morning and at bedtime. 90 tablet 1   . calcium carbonate-vitamin D3 500 mg-5 mcg (200 unit) tablet Take 1 tablet by mouth in the morning.     . cyanocobalamin (Vitamin B-12) 1,000 mcg tablet Take 100 mcg by mouth in the morning.     . famotidine (Pepcid) 20 mg tablet Take 20 mg by mouth if needed each day for indigestion.     . hydrocortisone 0.5 % ointment Apply 1 application topically if needed in the morning and at bedtime for rash. 28.4 g 11   . hydroxychloroquine (Plaquenil) 200  mg tablet TAKE 1 TABLET BY MOUTH EVERY DAY WITH FOOD OR MILK 90 tablet 1   . immune globulin, human, (Gammagard) infusion Receives infusion every 4 weeks, next dose 8/5     . Jardiance 10 mg Take 10 mg by mouth in the morning.     [name] ketoconazole (NIZOral) 2 % cream Apply 1 application topically in the morning. 30 g 11   . lidocaine (Lidoderm) 5 % patch Apply 1 patch topically in the morning. Remove & discard patch within 12 hours or as directed by MD. 30 patch 1   . lidocaine (LMX) 4 % cream Apply 1 application topically if needed in the morning, at noon, in the evening, and at bedtime.     [name] lisinopril 2.5 mg tablet Take 2.5 mg by mouth in the morning.     . metFORMIN XR (Glucophage-XR) 500 mg 24 hr tablet Take 4 tablets (2,000 mg) by mouth with breakfast. Do not crush, chew, or split. 360 tablet 3   . mycophenolate (Cellcept) 500 mg tablet Take 3 tablets by mouth in the morning and at bedtime. Three in the AM, 2 in the PM     . omeprazole (PriLOSEC) 20 mg DR  capsule Take 20 mg by mouth in the morning and at bedtime.     [name] oxyCODONE (Roxicodone) 10 mg immediate release tablet Take 1 tablet (10 mg) by mouth in the morning. 56 tablet 0   . predniSONE (Deltasone) 10 mg tablet Take 1 tablet (10 mg) by mouth in the morning. 30 tablet 2   . pregabalin (Lyrica) 50 mg capsule Take 1 capsule (50 mg) by mouth in the morning and at bedtime. 60 capsule 2   . traZODone (Desyrel) 100 mg tablet Take 1 tablet (100 mg) by mouth if needed at bedtime for sleep. 30 tablet 3   . ursodiol (Actigall) 500 mg tablet Take 1 tablet (500 mg) by mouth in the morning and at bedtime. 180 tablet 3     No current facility-administered medications for this visit.       Social history:  Tobacco Use: Low Risk    . Smoking Tobacco Use: Never Smoker   . Smokeless Tobacco Use: Never Used     Alcohol Use: Not on file     Physical Exam  Gen: well-appearing, in NAD, A&O  Examination of the right hand demonstrates obvious arthritic deformities.  She has tenderness to palpation at the A1 pulley of the right thumb  She has well maintained IP flexion at the thumb.  She also has tenderness to palpation about the [facility] joint.     Range of motion of the hand:  Index finger MP flexion is 65 degrees, PIP flexion 100 degrees lacking 20 degrees of extension  Middle finger MP flexion is 70 degrees, PIP flexion 95 degrees, lacking 25 degrees of extension  Ring finger MP flexion 70 degrees, PIP flexion 95 degrees, lacking 20 degrees of extension  Small finger MP flexion 75 degrees, PIP flexion 95 degrees, lacking 25 degrees of extension    She is diffusely tender about the MP joints and PIP joints, however this is most prominent at the index finger finger MP joints bilaterally.   Sensation is intact to light touch, hand is warm well perfused    Imaging:   No new imaging today    Problem List Items Addressed This Visit        Musculoskeletal    Trigger thumb, right thumb - Primary  Arthritis of hand, left        Hand / UE  Inj/Asp for trigger finger on 12/10/2020 12:54 PM  Indications: pain  Details: 25 G needle  Medications: 12 mg betamethasone acet,sod phos 6 mg/mL; 3 mL lidocaine 10 mg/mL (1 %)  Outcome: tolerated well, no immediate complications    Under sterile technique, the flexor tendon sheath of the right thumb was injected today with 1 cc  of betamethasone  and 1 cc of 1% lidocaine.  A bandaid was applied to the injection site and the pt was given the standard post injection instructions  Procedure, treatment alternatives, risks and benefits explained, specific risks discussed.     S Inj/Asp: R index MCP on 12/10/2020 12:55 PM  Indications: pain  Details: 25 G needle  Medications: 3 mg betamethasone acet,sod phos 6 mg/mL; 0.5 mL lidocaine 10 mg/mL (1 %)    Under sterile technique, the right index finger MCP was injected today with 0.75 betamethasone and 0.25 lidocaine. A bandaid was applied to the injection site and the pt was given the standard post injection instructions    S Inj/Asp: L index MCP on 12/10/2020 12:56 PM  Indications: pain  Details: 25 G needle  Medications: 3 mg betamethasone acet,sod phos 6 mg/mL; 0.5 mL lidocaine 10 mg/mL (1 %)    Under sterile technique, the right index finger MCP was injected today with 0.75 betamethasone and 0.25 lidocaine. A bandaid was applied to the injection site and the pt was given the standard post injection instructions.      Assessment/Plan:  The patient was seen and evaluated with Dr. [name]. [name] is a 60 year-old female presenting here today for follow up. She had good relief from a right trigger finger cortisone injection 1 month ago. Given she is still symptomatic we discussed the option to have a repeat injection into the right thumb flexor tendon sheath. After risks and benefits were discussed the patient elected to proceed with an injection. She tolerated the procedure well.  We also discussed cortisone injections into the bilateral index finger MCP joints.  She  elected to proceed with these injections and she tolerated [name].  She will plan to follow-up with Dr. [name] as needed.  All questions were answered and patient is with the plan.    Jacqlyn Larsen, PA

## 2020-12-16 ENCOUNTER — Ambulatory Visit
Payer: PRIVATE HEALTH INSURANCE | Attending: Internal Medicine | Primary: Student in an Organized Health Care Education/Training Program

## 2020-12-26 MED ORDER — lidocaine (Xylocaine) 10 mg/mL (1 %) injection 0.5 mL
10 | Freq: Once | INTRAMUSCULAR | Status: AC | PRN
Start: 2020-12-26 — End: 2020-12-10
  Administered 2020-12-10: 17:00:00 .5 mL via INTRAMUSCULAR

## 2020-12-26 MED ORDER — betamethasone acet,sod phos (Celestone) injection 12 mg
6 | Freq: Once | INTRAMUSCULAR | Status: AC | PRN
Start: 2020-12-26 — End: 2020-12-10
  Administered 2020-12-10: 17:00:00 12 mg via INTRA_ARTICULAR

## 2020-12-26 MED ORDER — betamethasone acet,sod phos (Celestone) injection 3 mg
6 | Freq: Once | INTRAMUSCULAR | Status: AC | PRN
Start: 2020-12-26 — End: 2020-12-10
  Administered 2020-12-10: 17:00:00 3 mg via INTRA_ARTICULAR

## 2020-12-26 MED ORDER — lidocaine (Xylocaine) 10 mg/mL (1 %) injection 3 mL
10 | Freq: Once | INTRAMUSCULAR | Status: AC | PRN
Start: 2020-12-26 — End: 2020-12-10
  Administered 2020-12-10: 17:00:00 3 mL via INTRAMUSCULAR

## 2020-12-31 ENCOUNTER — Ambulatory Visit
Payer: PRIVATE HEALTH INSURANCE | Attending: Rheumatology | Primary: Student in an Organized Health Care Education/Training Program

## 2021-01-01 ENCOUNTER — Other Ambulatory Visit (HOSPITAL_BASED_OUTPATIENT_CLINIC_OR_DEPARTMENT_OTHER): Admitting: Rheumatology

## 2021-01-03 ENCOUNTER — Other Ambulatory Visit (HOSPITAL_BASED_OUTPATIENT_CLINIC_OR_DEPARTMENT_OTHER): Admitting: Student in an Organized Health Care Education/Training Program

## 2021-01-03 NOTE — Telephone Encounter (Signed)
 Last apt was with Dr [name] 12/03/20 plan to continue 1500mg  AM and 1000mg  PM  Next apt is 04/08/21  Last labs were 10/17/20

## 2021-01-03 NOTE — Telephone Encounter (Signed)
Resent with correct sig

## 2021-01-04 ENCOUNTER — Other Ambulatory Visit (HOSPITAL_BASED_OUTPATIENT_CLINIC_OR_DEPARTMENT_OTHER): Admitting: Student in an Organized Health Care Education/Training Program

## 2021-01-04 ENCOUNTER — Telehealth (HOSPITAL_BASED_OUTPATIENT_CLINIC_OR_DEPARTMENT_OTHER)

## 2021-01-04 MED ORDER — ketoconazole (NIZOral) 2 % cream
2 | Freq: Every day | TOPICAL | 1 refills | Status: AC
Start: 2021-01-04 — End: ?

## 2021-01-04 MED ORDER — oxyCODONE (Roxicodone) 10 mg immediate release tablet
10 | ORAL_TABLET | Freq: Every day | ORAL | 0 refills | 8.00000 days | Status: DC
Start: 2021-01-04 — End: 2021-01-18

## 2021-01-04 NOTE — Telephone Encounter (Signed)
 PMP reviewed, no other prescribers or unexpected opioid prescriptions  56/28 day supply  Last filled/due:  11/17/20   Next due on/after: due today for refill    Last appt:   11/02/20  Next appt:   03/24/21  Last UDS: 06/08/20  Last contract: 06/08/20    [facility] and [facility] last [facility] on [facility] but never picked up by patient.   Refill pended for PCP

## 2021-01-04 NOTE — Telephone Encounter (Deleted)
 PMP reviewed, no other prescribers or unexpected opioid prescriptions  28/30 day supply  Last filled/due:    Next due on/after:    Last appt:  Next appt:  Last UDS: 06/08/20  Last contract: 06/08/20    **see BLUE STICKY NOTE for these dates, please update manually if done recently.    Refill pended for PCP

## 2021-01-04 NOTE — Telephone Encounter (Signed)
 Pt lvm for [name]. Pt is having an issue finding a doctor in [place] right now.    She is requesting a call back to discuss some issues she is having.

## 2021-01-05 ENCOUNTER — Other Ambulatory Visit (HOSPITAL_BASED_OUTPATIENT_CLINIC_OR_DEPARTMENT_OTHER): Admitting: Internal Medicine

## 2021-01-05 NOTE — Telephone Encounter (Signed)
 Patient called she states she has moved to the [place] a few weeks ago; she said that [name] gave her a list of providers in that area.  She is having trouble getting in touch with anyone and is wondering if [name] can reach out to someone to assist her.

## 2021-01-07 ENCOUNTER — Telehealth (HOSPITAL_BASED_OUTPATIENT_CLINIC_OR_DEPARTMENT_OTHER): Admitting: Student in an Organized Health Care Education/Training Program

## 2021-01-07 NOTE — Telephone Encounter (Signed)
 Pt is requesting medication to be sent to pharmacy on file. Please contact thank you   [id number] [place], [place] - [phone] AT [facility] [address]  P: [phone]  F: [phone]

## 2021-01-07 NOTE — Telephone Encounter (Signed)
 Dr. Dorene Grebe and Craige Cotta,  Per chart review, appears patient moved to [facility].  Will have to call patient on Monday to confirm.

## 2021-01-13 ENCOUNTER — Telehealth (HOSPITAL_BASED_OUTPATIENT_CLINIC_OR_DEPARTMENT_OTHER): Admitting: Student in an Organized Health Care Education/Training Program

## 2021-01-13 MED ORDER — traZODone (Desyrel) 100 mg tablet
100 | ORAL_TABLET | Freq: Every evening | ORAL | 0 refills | Status: DC | PRN
Start: 2021-01-13 — End: 2021-01-17

## 2021-01-13 NOTE — Telephone Encounter (Signed)
 I talked to patient  She has not yet moved to [facility] but hoping to at some point this year  She is still a resident in [place], comes back every month  She had requested refill of her oxycodone prior to leaving [facility] but reports there was an issue with the pharmacy and was not able to pick it up   Per history - we had sent a refill on 01/04/21  I checked PMP: last fill was 11/17/20  Other meds: pregablin, last fill 01/06/21    Dr. Dorene Grebe, okay to send technically  I made patient aware of our policy -- call at least 72 hrs in advance especially if she has plans to go to [facility] prior to refill. We should not be sending controlled substances to diff state  Plan to check [facility] PMP as well in the future  Okay to send trazodone as well  Eleanor Gatliff Clayton, PA

## 2021-01-13 NOTE — Telephone Encounter (Signed)
 See pt message    Spoke wpt  Pt was in [place] when she called for the refills  Pt now in [place]  Pt could not get refills before she left per pt and [facility] [place] [facility] to there      Pt stated she got only a small portion of Trazadone when they did her refill as she needed a new rx  -that they gave her just a few  But is completely out of oxycodone    willfwd pCP< PAs Divya, Conley Canal RN   01/13/21   11:27 AM

## 2021-01-13 NOTE — Telephone Encounter (Signed)
 Pt would like a callback regarding rx oxyCODONE and the traZODONE being sent to [id number] - [place], [place] - [phone] [facility]. AT [facility] [address]  P: [phone]  F: [phone]    Callback: [phone]

## 2021-01-15 ENCOUNTER — Other Ambulatory Visit (HOSPITAL_BASED_OUTPATIENT_CLINIC_OR_DEPARTMENT_OTHER): Admitting: Internal Medicine

## 2021-01-17 NOTE — Telephone Encounter (Signed)
 Long term management, tolerates well, has an upcoming appointment with PCP.    Derinda Late, RN  01/17/2021  4:07 PM

## 2021-01-18 ENCOUNTER — Other Ambulatory Visit (HOSPITAL_BASED_OUTPATIENT_CLINIC_OR_DEPARTMENT_OTHER): Admitting: Student in an Organized Health Care Education/Training Program

## 2021-01-18 NOTE — Telephone Encounter (Signed)
 ROXICODONE NEEDS ATTENTION CAN ONLY FILL FOR A SUPPLY.

## 2021-01-20 MED ORDER — metFORMIN XR (Glucophage-XR) 500 mg 24 hr tablet
500 | ORAL_TABLET | Freq: Every day | ORAL | 3 refills | Status: AC
Start: 2021-01-20 — End: 2022-01-20

## 2021-01-20 MED ORDER — lisinopril 2.5 mg tablet
2.5 | ORAL_TABLET | Freq: Every day | ORAL | 1 refills | Status: DC
Start: 2021-01-20 — End: 2021-07-18

## 2021-01-20 NOTE — Telephone Encounter (Signed)
 I talked to patient on the phone -- the prescription sent over the [facility] was never sent over  Now she is back in [place] and is hoping to get a refill on her oxycodone  I checked [place] and [place]   Last refill of oxycodone was Nov 17, 2020  Additionally [facility] and can do 30 or 28 tabs  Changed quantity to 28 tabs/28 day supply for ease of refills    Dr. Dorene Grebe - pleas sign script again thanks (it seems like the one from 10/13 never went over)    [employer], [place]

## 2021-01-21 MED ORDER — oxyCODONE (Roxicodone) 10 mg immediate release tablet
10 | ORAL_TABLET | Freq: Every day | ORAL | 0 refills | 8.00000 days | Status: DC
Start: 2021-01-21 — End: 2021-02-23

## 2021-01-21 NOTE — Telephone Encounter (Signed)
 I signed the script on behalf of pcp  Christinna Sprung [place], [place]

## 2021-01-21 NOTE — Telephone Encounter (Signed)
 Pt called stating pharmacy has not received prescription for Oxycodone and would like a call back regarding update on prescription. Thanks

## 2021-01-24 NOTE — Telephone Encounter (Signed)
 Noted  Will fwd PCP< PA Divy, Conley Canal RN   01/24/21   10:21 AM

## 2021-01-24 NOTE — Telephone Encounter (Signed)
 Called pharmacy. Not sure what the issues was but they are in the process of filling it now.     Called patient and LVM that pharmacy is filling medication now.

## 2021-01-24 NOTE — Telephone Encounter (Signed)
 Pt calling because she says pharmacy states that the prescription can only be filled for 30 pills not 56 pills. Pt is requesting a CB from Dr. Dorene Grebe. Please call pt back ASAP. She is very upset. She is completely out of her medication.

## 2021-01-24 NOTE — Telephone Encounter (Signed)
 Thank you :)

## 2021-01-24 NOTE — Telephone Encounter (Signed)
 Nichole, could you check with her pharmacy? I talked to patient on Friday (in another refill note) last week and sent a script of 28 tabs (instead of 56). Per history it seems like receipt was confirmed by pharmacy. Not sure what the issue is  Thanks!

## 2021-01-30 ENCOUNTER — Encounter

## 2021-01-30 ENCOUNTER — Other Ambulatory Visit (HOSPITAL_BASED_OUTPATIENT_CLINIC_OR_DEPARTMENT_OTHER): Admitting: Registered Nurse

## 2021-01-30 ENCOUNTER — Other Ambulatory Visit (HOSPITAL_BASED_OUTPATIENT_CLINIC_OR_DEPARTMENT_OTHER): Admitting: Rheumatology

## 2021-01-31 NOTE — Telephone Encounter (Signed)
 Last apt was with Dr [name] 12/03/20   Next apt is 04/08/2021  Last labs were 10/17/20

## 2021-02-01 ENCOUNTER — Telehealth (HOSPITAL_BASED_OUTPATIENT_CLINIC_OR_DEPARTMENT_OTHER)

## 2021-02-01 NOTE — Telephone Encounter (Signed)
 Pt lvm requesting call back from [facility]. She stated that she is having some issues with the doctors in [place] and would like to discuss them with [name].

## 2021-02-01 NOTE — Telephone Encounter (Addendum)
 Pt calling for the follow:  -Medical records  -Referral with demographics   -IVIG Angel  -F/u 04/08/2021    Faxed to new rheum.

## 2021-02-02 ENCOUNTER — Telehealth (HOSPITAL_BASED_OUTPATIENT_CLINIC_OR_DEPARTMENT_OTHER): Admitting: Registered Nurse

## 2021-02-02 ENCOUNTER — Telehealth (HOSPITAL_BASED_OUTPATIENT_CLINIC_OR_DEPARTMENT_OTHER)

## 2021-02-02 NOTE — Telephone Encounter (Signed)
 Pt is looking to have an appt sch for Nov 22nd to the 29th bc she will be available at this time. She is having uncontrollable bowel movements and is having pain in lower abdomen. I tried to sch this pt and there is none avail for Dr Lilyan Punt. She is also open to being sch with Dr Trevor Iha. Pt contact is [phone].

## 2021-02-02 NOTE — Telephone Encounter (Signed)
 Pt lvm asking to speak with [name]. She said there are a couple of things she forgot to mention when she spoke with her yesterday

## 2021-02-03 NOTE — Telephone Encounter (Signed)
 Spoke with patient. Scheduled for 11/23 (General GI)

## 2021-02-07 ENCOUNTER — Telehealth (HOSPITAL_BASED_OUTPATIENT_CLINIC_OR_DEPARTMENT_OTHER): Admitting: Student in an Organized Health Care Education/Training Program

## 2021-02-07 NOTE — Telephone Encounter (Signed)
 Pt call she missed the call from DR .[name] and req a call a back please , pt #[phone], thank you.

## 2021-02-08 NOTE — Telephone Encounter (Signed)
 Informed pt that she can call [facility] to schedule for the infusion.

## 2021-02-10 ENCOUNTER — Telehealth (HOSPITAL_BASED_OUTPATIENT_CLINIC_OR_DEPARTMENT_OTHER)

## 2021-02-10 NOTE — Telephone Encounter (Signed)
 Quick note    Received a VM from [facility] stating that the patient needs a new order. Called service to f/up, they will be faxing over a copy of the order form to be reviewed and signed by provider. They are also requesting the last clinic note along with demographic info. The pt has an active PA on file.     [name] Vista Deck, [name]

## 2021-02-11 ENCOUNTER — Telehealth (HOSPITAL_BASED_OUTPATIENT_CLINIC_OR_DEPARTMENT_OTHER): Admitting: Rheumatology

## 2021-02-11 NOTE — Telephone Encounter (Signed)
 Pt call that she spoke with [facility] and they said to her that they speak with A ngel and they still waiting for all the documents that they need to schedule the pt for her infusion , Please call pt # [phone], thank you.

## 2021-02-14 ENCOUNTER — Other Ambulatory Visit (HOSPITAL_BASED_OUTPATIENT_CLINIC_OR_DEPARTMENT_OTHER): Admitting: Student in an Organized Health Care Education/Training Program

## 2021-02-14 NOTE — Telephone Encounter (Signed)
 LV:09/03/20  NV:04/08/20  Dx: RA  Labs: 10/17/20

## 2021-02-16 ENCOUNTER — Telehealth (HOSPITAL_BASED_OUTPATIENT_CLINIC_OR_DEPARTMENT_OTHER): Admitting: Student in an Organized Health Care Education/Training Program

## 2021-02-16 NOTE — Telephone Encounter (Signed)
 Pt would like a cb from doctor regarding scheduling an appt for infusion bc [phone] thank you

## 2021-02-18 ENCOUNTER — Telehealth (HOSPITAL_BASED_OUTPATIENT_CLINIC_OR_DEPARTMENT_OTHER)

## 2021-02-18 NOTE — Telephone Encounter (Signed)
 Called patient [name]  to confirm appt for 11/23 @ 10:40 am.

## 2021-02-21 ENCOUNTER — Telehealth (HOSPITAL_BASED_OUTPATIENT_CLINIC_OR_DEPARTMENT_OTHER): Admitting: Hand Surgery

## 2021-02-21 ENCOUNTER — Telehealth (HOSPITAL_BASED_OUTPATIENT_CLINIC_OR_DEPARTMENT_OTHER): Admitting: Student in an Organized Health Care Education/Training Program

## 2021-02-21 NOTE — Telephone Encounter (Signed)
error 

## 2021-02-21 NOTE — Telephone Encounter (Signed)
 Informed pt that we're working on it and will inform her when it's all set up.

## 2021-02-21 NOTE — Telephone Encounter (Signed)
 Pt needing an appt to set up her infusion, pt mentioned that her last appt was in September and that she has already missed one and is now playing catch up. Pt mentioned she needs a c/b to [phone] to set this up her infusion appt  ASAP.  Thank you

## 2021-02-23 ENCOUNTER — Telehealth (HOSPITAL_BASED_OUTPATIENT_CLINIC_OR_DEPARTMENT_OTHER): Admitting: Physician Assistant

## 2021-02-23 ENCOUNTER — Ambulatory Visit: Admit: 2021-02-23 | Discharge: 2021-02-23 | Payer: PRIVATE HEALTH INSURANCE | Attending: Gastroenterology

## 2021-02-23 ENCOUNTER — Ambulatory Visit
Payer: PRIVATE HEALTH INSURANCE | Attending: Medical | Primary: Student in an Organized Health Care Education/Training Program

## 2021-02-23 ENCOUNTER — Encounter

## 2021-02-23 ENCOUNTER — Inpatient Hospital Stay
Admit: 2021-02-23 | Payer: PRIVATE HEALTH INSURANCE | Primary: Student in an Organized Health Care Education/Training Program

## 2021-02-23 ENCOUNTER — Ambulatory Visit
Payer: PRIVATE HEALTH INSURANCE | Attending: Student in an Organized Health Care Education/Training Program | Primary: Student in an Organized Health Care Education/Training Program

## 2021-02-23 ENCOUNTER — Other Ambulatory Visit (HOSPITAL_BASED_OUTPATIENT_CLINIC_OR_DEPARTMENT_OTHER)

## 2021-02-23 ENCOUNTER — Ambulatory Visit
Admit: 2021-02-23 | Discharge: 2021-02-23 | Payer: PRIVATE HEALTH INSURANCE | Attending: Physician Assistant | Primary: Student in an Organized Health Care Education/Training Program

## 2021-02-23 ENCOUNTER — Other Ambulatory Visit
Admit: 2021-02-23 | Payer: PRIVATE HEALTH INSURANCE | Primary: Student in an Organized Health Care Education/Training Program

## 2021-02-23 ENCOUNTER — Encounter (HOSPITAL_BASED_OUTPATIENT_CLINIC_OR_DEPARTMENT_OTHER): Admitting: Rheumatology

## 2021-02-23 ENCOUNTER — Telehealth (HOSPITAL_BASED_OUTPATIENT_CLINIC_OR_DEPARTMENT_OTHER): Admitting: Internal Medicine

## 2021-02-23 ENCOUNTER — Other Ambulatory Visit

## 2021-02-23 VITALS — BP 129/87 | HR 85 | Temp 98.1°F | Ht 62.99 in | Wt 146.0 lb

## 2021-02-23 VITALS — BP 127/78 | Ht 63.0 in | Wt 145.6 lb

## 2021-02-23 DIAGNOSIS — E119 Type 2 diabetes mellitus without complications: Secondary | ICD-10-CM

## 2021-02-23 DIAGNOSIS — M7989 Other specified soft tissue disorders: Secondary | ICD-10-CM

## 2021-02-23 DIAGNOSIS — K219 Gastro-esophageal reflux disease without esophagitis: Secondary | ICD-10-CM

## 2021-02-23 DIAGNOSIS — R7989 Other specified abnormal findings of blood chemistry: Secondary | ICD-10-CM

## 2021-02-23 LAB — COMPREHENSIVE METABOLIC PANEL
ALT: 32 U/L (ref 0–55)
AST: 22 U/L (ref 6–42)
Albumin: 4.5 g/dL (ref 3.2–5.0)
Alkaline phosphatase: 131 U/L — ABNORMAL HIGH (ref 30–130)
Anion Gap: 10 mmol/L (ref 3–14)
BUN: 25 mg/dL — ABNORMAL HIGH (ref 6–24)
Bilirubin, total: 0.8 mg/dL (ref 0.2–1.2)
CO2 (Bicarbonate): 21 mmol/L (ref 20–32)
Calcium: 10.1 mg/dL (ref 8.5–10.5)
Chloride: 106 mmol/L (ref 98–110)
Creatinine: 0.78 mg/dL (ref 0.55–1.30)
Glucose: 180 mg/dL — ABNORMAL HIGH (ref 70–139)
Potassium: 4.5 mmol/L (ref 3.6–5.2)
Protein, total: 7.5 g/dL (ref 6.0–8.4)
Sodium: 137 mmol/L (ref 135–146)
eGFRcr: 88 mL/min/{1.73_m2} (ref >=60)

## 2021-02-23 LAB — URINE DRUG SCREEN
Amphetamines screen, urine: NOT DETECTED
Barbiturates screen, urine: NOT DETECTED
Benzodiazepines screen, urine: NOT DETECTED
Buprenorphine screen, urine: NOT DETECTED
Cannabinoids screen, urine: DETECTED — AB
Cocaine screen, urine: NOT DETECTED
Creatinine, urine, specimen validity: 62.64 mg/dL (ref >=20.00)
Ethanol screen, urine: NOT DETECTED
Fentanyl screen, urine: NOT DETECTED
Methadone screen, urine: NOT DETECTED
Opiates screen, urine: NOT DETECTED
Oxycodone screen, urine: NOT DETECTED

## 2021-02-23 LAB — CBC
Hematocrit: 43 % (ref 32.0–47.0)
Hemoglobin: 13.8 g/dL (ref 11.0–16.0)
MCH: 30.2 pg (ref 26.0–34.0)
MCHC: 32.1 g/dL (ref 31.0–37.0)
MCV: 94.1 fL (ref 80.0–100.0)
MPV: 9.6 fL (ref 9.1–12.4)
NRBC %: 0 % (ref 0.0–0.0)
NRBC Absolute: 0 10*3/uL (ref 0.00–2.00)
Platelets: 257 10*3/uL (ref 150–400)
RBC: 4.57 M/uL (ref 3.70–5.20)
RDW-CV: 13.8 % (ref 11.5–14.5)
RDW-SD: 47.3 fL (ref 35.0–51.0)
WBC: 9.7 10*3/uL (ref 4.0–11.0)

## 2021-02-23 LAB — LIPID PANEL
Cholesterol/HDL Ratio: 2.6
Cholesterol: 168 mg/dL (ref <=200)
HDL cholesterol: 64 mg/dL (ref >=40)
LDL cholesterol, calculated: 85 mg/dL (ref 0–130)
Triglycerides: 93 mg/dL (ref <=150)

## 2021-02-23 LAB — HEMOGLOBIN A1C: HEMOGLOBIN A1C % (INT/EXT): 6.6 % — ABNORMAL HIGH (ref <5.6)

## 2021-02-23 LAB — ALBUMIN, URINE, RANDOM (INCLUDING CREATININE)
Albumin, urine (INT/EXT): 2 mg/dL
Albumin/Creatinine ratio: 32 mg/g{creat} — ABNORMAL HIGH (ref <=30)
Creatinine, urine, random (INT/EXT): 62.92 mg/dL (ref 15.00–328.00)

## 2021-02-23 LAB — BILIRUBIN, DIRECT: Bilirubin, direct: 0.4 mg/dL (ref 0.0–0.5)

## 2021-02-23 MED ORDER — baclofen (Lioresal) 10 mg tablet
10 | ORAL_TABLET | Freq: Two times a day (BID) | ORAL | 1 refills | Status: DC
Start: 2021-02-23 — End: 2021-06-17

## 2021-02-23 MED ORDER — polyethylene glycol (Golytely) 236-22.74-6.74 -5.86 gram solution
236-22.74-6.74 | Freq: Once | ORAL | 0 refills | 28.00000 days | Status: AC
Start: 2021-02-23 — End: 2021-02-23

## 2021-02-23 MED ORDER — traZODone (Desyrel) 100 mg tablet
100 | ORAL_TABLET | Freq: Every evening | ORAL | 1 refills | Status: DC | PRN
Start: 2021-02-23 — End: 2021-03-24

## 2021-02-23 MED ORDER — oxyCODONE (Roxicodone) 10 mg immediate release tablet
10 | ORAL_TABLET | Freq: Every day | ORAL | 0 refills | 8.00000 days | Status: DC
Start: 2021-02-23 — End: 2021-03-30

## 2021-02-23 NOTE — Progress Notes (Signed)
 [facility]  GASTROENTEROLOGY-[place], [place]   Returning patient Visit Note - Adult    MRN: [id number]  Patient Name: [name]    DOB:  06-28-1960  Gender: female   AGE:  60 y.o.  Encounter Date: 02/23/2021     Referring Physician: No ref. provider found   Primary Care Provider: Baldemar Friday, MD  Primary Language: [place] Ponds    Chief Complaint/Presenting Diagnosis:  Diarrhea    Interval History:  [name] is a 60 year old female, with PMH of scleroderma (CREST), IgG4 deficiency, RA, chronic steroid [place], history of diverticulosis, gastroparesis, and hx of melanotic stools, who presents today for follow up.    She has been previously evaluated for chronic autoimmune hepatitis with long standing elevations in ALK phos levels and positive ANA 1:1280 markers. Additionally, given her multiple autoimmune conditions, there were suspicions for autoimmune hepatitis. Her AMA was previously negative in 2014; biopsy was previously deferred due to knee/back surgery timeline issues. She has been taking mycophenolate 500mg  BID + prednisone low dose for an extended duration. Additionally, she has been was started on ursodiol for her alk phos elevations previously and continues to take her medications without complications.    Labs 7/22 - ALK 156, AST 24, ALT 35, TBIL 0.5 (GGT 268, ALK 207)    Additionally, she experienced new onset persistent diarrhea episodes in October, self resolving after 3-4 weeks. She had been previously instructed to increase her fiber intake and has been taking metamucil supplements. She noted foul smelling, more stool than water content, and accompanying cramping/diffuse lower abdominal discomfort for her symptoms. She had a colonoscopy 1 year prior for unrelated symptoms that noted diverticulosis, but no biopsies of mucosal wall were collected at that time. Her symptoms self-resolved; however, 3 days ago, she started experiencing similar symptoms again and she states they are impacting/interfering  with her daily routines.    Allergies  No Known Allergies     Medications  Current Outpatient Medications   Medication Instructions   . baclofen (LIORESAL) 10 mg, oral, 2 times daily   . calcium carbonate-vitamin D3 500 mg-5 mcg (200 unit) tablet 1 tablet, oral, Daily   . cyanocobalamin (VITAMIN B-12) 100 mcg, oral, Daily   . famotidine (PEPCID) 20 mg, oral, Daily PRN   . hydrocortisone 0.5 % ointment 1 application, Topical, 2 times daily PRN   . hydroxychloroquine (Plaquenil) 200 mg tablet TAKE 1 TABLET BY MOUTH EVERY DAY WITH FOOD OR MILK   . immune globulin, human, (Gammagard) infusion Receives infusion every 4 weeks, next dose 8/5   . Jardiance 10 mg, oral, Daily   . ketoconazole (NIZOral) 2 % cream 1 application, Topical, Daily   . lidocaine (Lidoderm) 5 % patch 1 patch, topical (top), Daily, Remove & discard patch within 12 hours or as directed by MD.   . lidocaine (LMX) 4 % cream 1 application, Topical, 4 times daily PRN   . lisinopril 2.5 mg, oral, Daily   . metFORMIN XR (GLUCOPHAGE-XR) 2,000 mg, oral, Daily with breakfast, Do not crush, chew, or split.    . mycophenolate (Cellcept) 500 mg tablet Take 3 tablets in the morning and two tablets at bedtime   . omeprazole (PRILOSEC) 40 mg, oral, 2 times daily before meals   . oxyCODONE (ROXICODONE) 10 mg, oral, Daily   . predniSONE (DELTASONE) 20 mg, oral, Daily   . pregabalin (LYRICA) 50 mg, oral, 2 times daily   . traZODone (DESYREL) 200 mg, oral, Nightly PRN   .  ursodiol (Actigall) 500 mg tablet TAKE 1 TABLET BY MOUTH TWICE A DAY FOR 90 DAYS        Past Medical History  Patient Active Problem List   Diagnosis Code   . 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 Gastroesophageal reflux disease without esophagitis K21.9   . IgG4 deficiency (CMS/HCC) D80.3   . Systemic sclerosis, unspecified (CMS/HCC) M34.9   . 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 Type 2  diabetes mellitus without complication (CMS/HCC) E11.9   . Left hip pain M25.552   . Long-term use of high-risk medication Z79.899   . Abnormal liver function tests R79.89   . 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 Scoliosis M41.9   . Varicose veins of both lower extremities I83.93   . Trigger thumb, right thumb M65.311   . Arthritis of hand, left M19.042   . Diarrhea R19.7   . 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 Urge incontinence N39.41   . Chronic pain syndrome G89.4   . Current chronic use of systemic steroids Z79.52   . Leg swelling M79.89   . Primary insomnia F51.01   . Screening mammogram for breast cancer Z12.31   . 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 Elevated alkaline phosphatase level R74.8   . 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 CARPAL TUNNEL RELEASE Left 01/2011   . CHOLECYSTECTOMY  1990   . FOOT SURGERY Left 2008    Plating of left 3rd metatarsal   . HAND SURGERY  2013   . HAND SURGERY  2015   . HARDWARE REMOVAL Right 2006    shoulder due to infection   . HERNIA REPAIR  1990   . LUMBAR FUSION Left 02/2020    L4-L5 transforaminal lumbar interbody fusion   . LUMBAR LAMINECTOMY  02/2020    L4-L5, with complete left facetectomy and decompression   . ORIF FOOT FRACTURE Left     Left 4th metatarsal   . OSTEOTOMY AND ULNAR SHORTENING Left 1994   . OTHER SURGICAL HISTORY Left 05/2019    Hamstring repair   . REVERSE TOTAL SHOULDER ARTHROPLASTY Left 03/07/2016   . ROTATOR CUFF REPAIR Right 2006   . SHOULDER SURGERY Right 2007    Recurrent infection   . TOTAL KNEE ARTHROPLASTY Bilateral 2009   . TOTAL KNEE ARTHROPLASTY Right 2010    replacement of prior arthroplasty        Family History  Family History   Problem Relation Name Age of Onset   . Lung cancer Mother     . Diabetes type II Father     . Heart attack Father     . Other (systemic sclerosis with gangrene of fingers) Brother     .  Lung cancer Maternal Grandmother     . Brain Aneurysm Maternal Grandfather     . Lung cancer Paternal Grandmother     . Melanoma Mother's Brother          Social History  Social History     Tobacco Use   . Smoking status: Never   . Smokeless tobacco: Never   Substance Use Topics   . Alcohol use: Yes     Comment: Rare, social drinker   . Drug use: Never  Review of Systems  Constitutional: No fevers, chills, sweats  Eye: No recent visual problems  ENMT: No sore throat  Respiratory: No shortness of breath, cough  Cardiovascular: No chest pain, palpitations, syncope  Gastrointestinal: No nausea, vomiting, constipation, +diarrhea, +cramping/discomfort  Integumentary: No rash, pruritus  Neurologic: No headache    Physical Exam  Visit Vitals  BP 127/78 (BP Location: Left arm, Patient Position: Sitting, BP Cuff Size: Adult)        Body mass index is 25.79 kg/m.    General: Alert and oriented x3, well nourished, no acute distress.  Eye: EOMI, normal conjunctiva.  HEENT: Normocephalic, grossly normal hearing,, no scleral icterus,   Lungs: Clear to auscultation in anterior and posterior fields, non-labored respiration, no use of accessory muscles noted.  Heart: Normal rate, regular rhythm, no murmur or gallop appreciated.  Abdomen: Soft, non-tender to light and deep palpation, non-distended, normal bowel sounds, no masses appreciated, no hepatosplenomegaly palpable.  Musculoskeletal: Normal range of motion , no tenderness or swelling noted.   Skin: Skin is warm, dry and pink, no rashes or lesions.   Neurologic: CN II-XII grossly intact.  Psychiatric: Cooperative, appropriate mood and affect. Normal speech. Thought process linear, logical, with no delusions/hallucinations.    Lab Tests Reviewed:   Latest Reference Range & Units 10/01/20 12:38 10/17/20 09:15   Sodium 135 - 146 mmol/L 140 137   Potassium 3.6 - 5.2 mmol/L 4.1 3.6   Chloride 98 - 110 mmol/L 107 107   CO2 (Bicarbonate) 20 - 32 mmol/L 23 26   Anion Gap 3 - 14  mmol/L 10 4   BUN 6 - 24 mg/dL 20 14   Creatinine 1.61 - 1.30 mg/dL 0.96 0.45   eGFRcr >=40 mL/min/1.26m*2 93 103   Calcium 8.5 - 10.5 mg/dL 9.5 9.4   Glucose 70 - 110 mg/dL 981 (H) 191   Albumin 3.2 - 5.0 g/dL 4.3 3.9   Protein, total 6.0 - 8.4 g/dL  7.4   Aspartate Aminotransferase (AST) 6 - 42 U/L 22 24   Alanine Aminotransferase (ALT) 0 - 55 U/L 26 35   Bilirubin, direct 0.0 - 0.5 mg/dL  0.1   Alkaline phosphatase 30 - 130 U/L 207 (H) 156 (H)   Bilirubin, total 0.2 - 1.2 mg/dL 0.5 0.5   GGT 5 - 55 IU/L 268 (H)    WBC 4.0 - 11.0 K/uL 11.8 (H) 7.0   RBC 3.70 - 5.20 M/uL 4.43 4.71   Hemoglobin 11.0 - 16.0 g/dL 47.8 29.5   Hematocrit 32.0 - 47.0 % 41.1 44.7   MCV 80.0 - 100.0 fL 92.8 94.9   MCH 26.0 - 34.0 pg 29.8 29.5   MCHC 31.0 - 37.0 g/dL 62.1 30.8   RDW-CV 65.7 - 14.5 % 13.9 14.0   RDW-SD 35.0 - 51.0 fL 46.9 48.4   NRBC % 0.0 - 0.0 % 0.0 0.0   NRBC Absolute 0.00 - 2.00 K/uL 0.00 0.00   Platelets 150 - 400 K/uL 276 263   MPV 9.1 - 12.4 fL 10.4 11.0   Neutrophil % % 89.1 76.5   Lymphocyte % % 6.2 16.8   Monocytes % % 3.3 5.2   Eosinophils % % 0.3 0.6   Basophils % % 0.4 0.6   Immature Granulocytes % % 0.7 0.3   Neutrophils Absolute 1.50 - 7.95 K/uL 10.49 (H) 5.32   Lymphocytes Absolute 0.70 - 4.00 K/uL 0.73 1.17   Monocytes Absolute 0.36 - 0.77 K/uL 0.39 0.36   Eosinophils Absolute  0.00 - 0.50 K/uL 0.03 0.04   Basophils Absolute 0.00 - 0.22 K/uL 0.05 0.04   Immature Granulocytes Absolute 0.00 - 0.10 K/uL 0.08 0.02   Sed Rate 0 - 30 mm/hr  13   PROTIME 9.7 - 14.0 seconds 11.5    INR See Interpretive Comment  1.00    CRP 0.29 - 0.80 mg/dL  <1.61 (L)   Immunoglobulin G, Serum 600 - 1640 mg/dL 096    IgG 045 - 4,098 mg/dL 119    Immunoglobulin G Subclass 1    382 - 929 mg/dL 147    Immunoglobulin G Subclass 2 241 - 700 mg/dL 829    Immunoglobulin G Subclass 3 22 - 178 mg/dL 21 (L)    Immunoglobulin G Subclass 4 4 - 86 mg/dL 56.2    IgM 40 - 130 mg/dL 865 (H)    IgA 78.4 - 696.2 mg/dL 95.2 (L)    (H): Data is  abnormally high  (L): Data is abnormally low    Imaging Reviewed:  [place] ABD 11/09/20  IMPRESSION:   * Dilated common bile duct measuring up to 9 mm, likely the sequela of the postcholecystectomy state, similar in appearance compared to CT abdomen/pelvis dated 08/11/2019.   * Genius echotexture suggesting hepatocellular disease.   * Nonvisualization of the pancreas due to overlying bowel gas.   The image quality was normal.   Maury Dus 11/09/2020 10:52 AM EDT    Endoscopy/Pathology Reviewed:  Colonoscopy 05/31/2019  Procedure:  Colonoscopy  Indications:  Hematochezia  Comorbidities   Providers:  Melany Guernsey, MD  Referring MD:   Requesting Provider:  Medicines: Midazolam 4 mg IV, Fentanyl 100 micrograms IV  Complications: No immediate complications.  _______________________________________________________________________________  Estimated Blood Loss:  Procedure: After I obtained informed consent, the scope was passed   under direct vision. Throughout the procedure, the   patient's blood pressure, pulse, and oxygen saturations   were monitored continuously. The Colonoscopy was   introduced through the anus and advanced to the cecum,   identified by appendiceal orifice and ileocecal valve. The   colonoscopy was performed without difficulty. The patient   tolerated the procedure well. The quality of the bowel   preparation was good.    Findings:  Multiple small and large-mouthed diverticula were found in the sigmoid   colon, descending colon and transverse colon.  Internal hemorrhoids were found during retroflexion. The hemorrhoids   were Grade I (internal hemorrhoids that do not prolapse).  The exam was otherwise without abnormality.    Impression: - Diverticulosis in the sigmoid colon, in the descending   colon and in the transverse colon.  - Internal hemorrhoids.  - The examination was otherwise normal.  - No specimens collected.    Recommendation: Advance Diet      ________________________  Melany Guernsey, MD  05/31/2019 10:29:07 AM  Number of Addenda: 0    Note Initiated On: 05/31/2019 10:01 AM    Assessment & Plan:  1. Abnormal liver function tests    2. Diarrhea, unspecified type    3. Systemic sclerosis, unspecified (CMS/HCC)    4. Seronegative rheumatoid arthritis (CMS/HCC)    5. Scleroderma progressive (CMS/HCC)    6. CREST syndrome (CMS/HCC)    7. Elevated alkaline phosphatase level    8. 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 female with PMH of scleroderma (CREST), IgG4 deficiency, RA, chronic steroid [place], history of  diverticulosis, gastroparesis, and hx of melanotic stools, who is presenting for follow up of her chronic LFT elevations and suspicion for autoimmune hepatitis. She has had persistent ALK phos elevations for a long period of time, and in combination with her extensive past medical history, was suspected to have a component of autoimmune liver disease. She has not had a biopsy due to prior conflicts with knee/back surgical timelines; however, potential need for biopsy was discussed previously and during this encounter. Today, we will repeat labwork to obtain latest alk phos levels and related serologies -- AMA, bone specific alk phos, etc. Her prior AMA testing was in 2014/earlier and was unremarkable. She is currently already on ursodiol tx, which would be first line treatment for PBC if diagnosed. Additionally, she is presenting with symptoms persistent diarrhea without any identifiable inciting factors. We will request stool infectious testing and schedule for an upcoming colonoscopy to rule out conditions, such as microscopic colitis.    Summary Recommendations:    1. Elevated alkaline phosphatase levels  - persistent elevations, repeat labwork today with additional studies for AMA, Bone specific Alk Phos  - continue ursodiol as prescribed  - consider biopsy if results inconclusive to evaluate for autoimmune hepatitis in the  setting of multiple autoimmune conditions in medical history    2. Persistent diarrhea episodes  - new onset (October) persistent episodes that self-resolved late Oct, but restarted several days prior to this encounter  - plan for stool infectious workup  - repeat colonoscopy to evaluate for colitis    Return to clinic: 6 months    Patient was seen and discussed with Dr. Trevor Iha.    Sundar V. Cherukuri, DO  PGY 4 - Hepatology Fellow  [facility]      Patient seen and examined and agree with GI/hepatology fellow's assessment and plan.   60 y.o. female with PMH of scleroderma (CREST), IgG4 deficiency, RA, chronic steroid [place], history of diverticulosis, gastroparesis, and hx of melanotic stools, who is presenting for follow up of her chronic LFT elevations. She has elevated alk phos of unclear etiology, high suspicion for autoimmune liver disease, including PBC vs autoimmune.  Last AMA checked was in 2014. Around 5% of PBC patients are AMA negative. She is on 1000 mg of Urso daily. She has not had liver biopsy, however despite no confirmation diagnosis of PBC or autoimmune diagnosis she is on Urso as well as immunosuppressant Cellcept and plaquenil   On a separate note she describes longstanding diarrhea of unclear etiology; No history of travel or exposure to toxins  Will rule out infectious diarrhea  rule out microscopic colitis vs opportunistic infections (i.e. CMV colitis)    Plan:  Check stool cultures  Schedule colonoscopy with colon biopsy to rule out colitis (microscopic vs infectious)  Continue Ursodiol 1000 mg every day  Continue to monitor LFTS    Follow-up in 6 months      Alinda Money, MD  [facility]    Orders Placed This Encounter   Procedures   . Fecal leukocytes   . Hepatic function panel   . Mitochondrial IgG Autoantibodies   . Alkaline phosphatase, bone specific

## 2021-02-23 NOTE — Assessment & Plan Note (Signed)
 Low suspicion for Acute DVT however does have varicose veins and tenderness along the calf and front of the leg/near shin. Patient also have history of DVT in the past, same leg and recently just returned on a flight from [facility] (although a short flight, she was sitting the whole time and was transported on a wheelchair through the airport)  Patient reports due to not having her achilles on the left leg, her calf muscles have atrophied so R leg is always bigger than L which is the case on my exam today    Will obtain duplex U/S to rule out acute DVT today  Urged use of compression stockings in general due to her varicose veins  Will reach out to patient with results

## 2021-02-23 NOTE — Assessment & Plan Note (Signed)
 Describes extreme urgency and needing to run to the bathroom to avoid leaking  -discussed behavioral modifications such as avoiding bladder triggers (soda, citrus products, caffeine)  -keep a voiding diary, aim for timed voiding every 2 hrs  -recommend doing kegels daily  -recommend pelvic floor PT, patient is here during the holiday season and is returning back to [facility]. I gave patient an external PT referral form for pelvic floor PT and urged her to find a specialist in [facility]

## 2021-02-23 NOTE — Telephone Encounter (Signed)
 Pt called to notify doctor that they couldn't do her infusion at the infusion center bc [phone] thank you

## 2021-02-23 NOTE — Telephone Encounter (Signed)
 GI department called pt, is running a few mins late to her 11am appointment. She is on the way from the GI Department.

## 2021-02-23 NOTE — Assessment & Plan Note (Addendum)
 Follows with Dr. Ephriam Jenkins  - medications: metformin full dose and jardiance 10mg  daily. Has been under good control. Will check A1c and urine microalbumin today  - BP: stable, on lisinopril 2.5mg   - eye appt - reports compliance to yearly eye exam  - encouraged continued adherence and lifestyle modifications. Continues on chronic steroids so will also need close follow up on sugar as well  - LDL - No results found for: LDLCALC  - HGBA1C - No results found for: HGBA1C  - urine microalbumin - No results found for: MICROALBUR, [id number]  - return to clinic 3 months

## 2021-02-23 NOTE — Assessment & Plan Note (Signed)
 DEXA due this year  Order placed  Last done 2 years ago with normal bone mass  Recommend continued vitamin D and calcium supplementation

## 2021-02-23 NOTE — Progress Notes (Signed)
 [facility]  [address]  [address]  [place], [place] [address]  TEL: [phone]    PATIENT: [name]  MRN: [id number]   DATE OF APT: 02/23/21   TIME: 4:52 PM  PCP: Baldemar Friday, MD       HISTORY OF PRESENT ILLNESS:   Patient presents for follow up and acute concerns today:    Has history of Scleroderma - CREST dx 2007, IgG4 deficiency, Seronegative RA, Stage II fibrosis of Liver, PUD, DVT in 2006, multiple back surgeries, diabetes, GERD    #Right leg swelling and pain and redness  -2 weeks, has been getting worse  Flew from [facility] but that was around 1 hr, does get transported in wheelchair so was sitting for a long time  No traumas, no signs of infection  Has history of DVT in 2006, did a short course of coumadin  No SOB, CP, No fever, chills    #Refills needed:  Reports to taking 2 tabs of trazodone nightly for sleep with good relief instead of one 100mg . Would like script to be changed to 200mg /night as that is working well for her  Needs baclofen and oxy  Continues on pregabalin as well    #Urge incontinence  -feels a sudden urge to pee and cannot hold in so has to rush to the bathroom  -no UTI sx  -no new meds    Followed up with GI prior to this appt, they are ordering colonoscopy   Follows with rheum as well, currently on prednisone 5mg     Review of Systems:   Pertinent positives and negatives as per HPI, otherwise a 13 point review of systems was negative.     PAST MEDICAL HISTORY:     Past Medical History:   Diagnosis Date   . Achilles rupture, left, sequela 09/04/2020   . Bilateral carpal tunnel syndrome    . History of venous thromboembolism 2006    Left upper extremity   . IgG4 deficiency (CMS/HCC)    . Liver fibrosis     Stage II   . Paget's disease of bone    . Peptic ulcer disease    . Scoliosis     s/p left L4-L5 transforaminal lumbar interbody fusion   . Spinal stenosis at L4-L5 level     s/p L4-L5 laminectomy wtih complete facetctomy with decompression (02/2020)     Patient Active  Problem List   Diagnosis   . CVID (common variable immunodeficiency) (CMS/HCC)   . Gastroesophageal reflux disease without esophagitis   . IgG4 deficiency (CMS/HCC)   . Systemic sclerosis, unspecified (CMS/HCC)   . Seronegative rheumatoid arthritis (CMS/HCC)   . Spinal stenosis, lumbar region, with neurogenic claudication   . Type 2 diabetes mellitus without complication (CMS/HCC)   . Left hip pain   . Long-term use of high-risk medication   . Abnormal liver function tests   . Hypogammaglobulinemia (CMS/HCC)   . Scoliosis   . Varicose veins of both lower extremities   . Trigger thumb, right thumb   . Arthritis of hand, left   . Diarrhea   . Scleroderma progressive (CMS/HCC)   . Urge incontinence   . Chronic pain syndrome   . Current chronic use of systemic steroids   . Leg swelling   . Primary insomnia   . Screening mammogram for breast cancer       PAST SURGICAL HISTORY:     Past Surgical History:   Procedure Laterality Date   .  CARPAL TUNNEL RELEASE Left 01/2011   . CHOLECYSTECTOMY  1990   . FOOT SURGERY Left 2008    Plating of left 3rd metatarsal   . HAND SURGERY  2013   . HAND SURGERY  2015   . HARDWARE REMOVAL Right 2006    shoulder due to infection   . HERNIA REPAIR  1990   . LUMBAR FUSION Left 02/2020    L4-L5 transforaminal lumbar interbody fusion   . LUMBAR LAMINECTOMY  02/2020    L4-L5, with complete left facetectomy and decompression   . ORIF FOOT FRACTURE Left     Left 4th metatarsal   . OSTEOTOMY AND ULNAR SHORTENING Left 1994   . OTHER SURGICAL HISTORY Left 05/2019    Hamstring repair   . REVERSE TOTAL SHOULDER ARTHROPLASTY Left 03/07/2016   . ROTATOR CUFF REPAIR Right 2006   . SHOULDER SURGERY Right 2007    Recurrent infection   . TOTAL KNEE ARTHROPLASTY Bilateral 2009   . TOTAL KNEE ARTHROPLASTY Right 2010    replacement of prior arthroplasty        HOME MEDICATIONS:     Current Outpatient Medications   Medication Instructions   . baclofen (LIORESAL) 10 mg, oral, 2 times daily   . calcium  carbonate-vitamin D3 500 mg-5 mcg (200 unit) tablet 1 tablet, oral, Daily   . cyanocobalamin (VITAMIN B-12) 100 mcg, oral, Daily   . famotidine (PEPCID) 20 mg, oral, Daily PRN   . hydrocortisone 0.5 % ointment 1 application, Topical, 2 times daily PRN   . hydroxychloroquine (Plaquenil) 200 mg tablet TAKE 1 TABLET BY MOUTH EVERY DAY WITH FOOD OR MILK   . immune globulin, human, (Gammagard) infusion Receives infusion every 4 weeks, next dose 8/5   . Jardiance 10 mg, oral, Daily   . ketoconazole (NIZOral) 2 % cream 1 application, Topical, Daily   . lidocaine (Lidoderm) 5 % patch 1 patch, topical (top), Daily, Remove & discard patch within 12 hours or as directed by MD.   . lidocaine (LMX) 4 % cream 1 application, Topical, 4 times daily PRN   . lisinopril 2.5 mg, oral, Daily   . metFORMIN XR (GLUCOPHAGE-XR) 2,000 mg, oral, Daily with breakfast, Do not crush, chew, or split.    . mycophenolate (Cellcept) 500 mg tablet Take 3 tablets in the morning and two tablets at bedtime   . omeprazole (PRILOSEC) 40 mg, oral, 2 times daily before meals   . oxyCODONE (ROXICODONE) 10 mg, oral, Daily   . polyethylene glycol (Golytely) 236-22.74-6.74 -5.86 gram solution 4,000 mL, oral, Once   . predniSONE (Deltasone) 10 mg tablet TAKE 1 TABLET BY MOUTH EVERY DAY IN THE MORNING   . pregabalin (LYRICA) 50 mg, oral, 2 times daily   . traZODone (DESYREL) 200 mg, oral, Nightly PRN   . ursodiol (Actigall) 500 mg tablet TAKE 1 TABLET BY MOUTH TWICE A DAY FOR 90 DAYS        ALLERGIES:   No Known Allergies     FAMILY  HISTORY:     Family History   Problem Relation Name Age of Onset   . Lung cancer Mother     . Diabetes type II Father     . Heart attack Father     . Other (systemic sclerosis with gangrene of fingers) Brother     . Lung cancer Maternal Grandmother     . Brain Aneurysm Maternal Grandfather     . Lung cancer Paternal Grandmother     .  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  Separated from partner of 21 years in June 2021.       VITALS AND PHYSICAL EXAM:   Vitals:   Vitals:    02/23/21 1124   BP: 129/87   Pulse: 85   Temp: 36.7 C (98.1 F)   TempSrc: Oral   SpO2: 97%   Weight: 66.2 kg   Height: 1.6 m        Physical Exam  Vitals reviewed.   Constitutional:       General: She is not in acute distress.     Appearance: Normal appearance. She is not ill-appearing.   HENT:      Head: Normocephalic and atraumatic.      Mouth/Throat:      Mouth: Mucous membranes are moist.      Pharynx: No oropharyngeal exudate or posterior oropharyngeal erythema.   Eyes:      Conjunctiva/sclera: Conjunctivae normal.   Cardiovascular:      Rate and Rhythm: Normal rate and regular rhythm.      Heart sounds: No murmur heard.    No gallop.   Pulmonary:      Effort: Pulmonary effort is normal. No respiratory distress.      Breath sounds: Normal breath sounds. No wheezing or rales.   Musculoskeletal:         General: Tenderness present. No deformity. Normal range of motion.      Comments: RLE is bigger than LLE which is chronic per patient  Tenderness along the varicose veins on the R lateral side and calf  No pitting edema   No open sores, cut skin or obvious signs of infection or superficial phlebitis    Skin:     General: Skin is warm and dry.      Coloration: Skin is not jaundiced.      Findings: No bruising or rash.   Neurological:      General: No focal deficit present.      Mental Status: She is alert and oriented to person, place, and time.      Motor: No weakness.      Gait: Gait normal.   Psychiatric:         Mood and Affect: Mood normal.         Behavior: Behavior normal.          LABS:     Lab Results   Component Value Date    HGBA1C 6.6 (H) 02/23/2021      Lab Results   Component Value Date    GLUCOSE 180 (H) 02/23/2021    CALCIUM 10.1 02/23/2021    NA 137 02/23/2021     K 4.5 02/23/2021    CO2 21 02/23/2021    CL 106 02/23/2021    BUN 25 (H) 02/23/2021    CREATININE 0.78 02/23/2021      Lab Results   Component Value Date    BUN 25 (H) 02/23/2021      Lab Results   Component Value Date    CREATININE 0.78 02/23/2021      Lab Results   Component Value Date    ALT 32 02/23/2021    AST 22 02/23/2021  GGT 268 (H) 10/01/2020    ALKPHOS 131 (H) 02/23/2021    BILITOT 0.8 02/23/2021      Lab Results   Component Value Date    CALCIUM 10.1 02/23/2021    No results found for: IRON, TIBC, FERRITIN   No results found for: TSH   Lab Results   Component Value Date    WBC 9.7 02/23/2021    HGB 13.8 02/23/2021    HCT 43.0 02/23/2021    PLT 257 02/23/2021    CHOL 168 02/23/2021    TRIG 93 02/23/2021    HDL 64 02/23/2021    ALT 32 02/23/2021    AST 22 02/23/2021    NA 137 02/23/2021    K 4.5 02/23/2021    CL 106 02/23/2021    CREATININE 0.78 02/23/2021    BUN 25 (H) 02/23/2021    CO2 21 02/23/2021    INR 1.00 10/01/2020    HGBA1C 6.6 (H) 02/23/2021      HX LDL   Date/Time Value Ref Range Status   06/06/2019 09:38 AM 42.0 0 - 129 mg/dL Final     Comment:     Adult                     Desirable: <130 mg/dL     Borderline: 161-096 mg/dL  High:       >045 mg/dL    An accurate calculated LDL is obtained when the patient  is fasting at least 12 hours.     06/06/2019 09:38 AM 42.0 0 - 129 mg/dL Final     Comment:     Adult                     Desirable: <130 mg/dL     Borderline: 409-811 mg/dL  High:       >914 mg/dL    An accurate calculated LDL is obtained when the patient  is fasting at least 12 hours.       HX TRIGLYCERIDES   Date/Time Value Ref Range Status   06/06/2019 09:38 AM 109.0 40 - 250 mg/dL Final     Comment:     Adult                     Desirable:  <150 mg/dL     Borderline: 782-956 mg/dL  High:        >213 mg/dL     08/65/7846 96:29 AM 109.0 40 - 250 mg/dL Final     Comment:     Adult                     Desirable:  <150 mg/dL     Borderline: 528-413 mg/dL  High:        >244 mg/dL       HX  CHOLESTEROL   Date/Time Value Ref Range Status   06/06/2019 09:38 AM 131.0 110 - 199 mg/dL Final     Comment:     The National Cholesterol Education Program (NCEP) has set the   following guidelines for total cholesterol in adults   ages 11 and up:   Desirable:       <200 mg/dL   Borderline high: 010-272 mg/dL   High:            > or = 240 mg/dL  For children up to and including the age of 42:   Desirable:      <170  mg/dL   Borderline ZOXW:960-454 mg/dL   High:           > or = 200 mg/dL     09/81/1914 78:29 AM 131.0 110 - 199 mg/dL Final     Comment:     The National Cholesterol Education Program (NCEP) has set the   following guidelines for total cholesterol in adults   ages 78 and up:   Desirable:       <200 mg/dL   Borderline high: 562-130 mg/dL   High:            > or = 240 mg/dL  For children up to and including the age of 69:   Desirable:      <170 mg/dL   Borderline QMVH:846-962 mg/dL   High:           > or = 200 mg/dL       HX HEMOGLOBIN X5M   Date/Time Value Ref Range Status   04/22/2020 02:18 PM 6.1 (H) *<5.7 % Final     Comment:     This test was performed using the Abbott enzymatic A1c assay, certified by   theNGSP.  A1c results, regardless of method, are affected both by mean average glucose   concentrations and by RBC lifespan.     04/22/2020 02:18 PM 6.1 (H) *<5.7 % Final     Comment:     This test was performed using the Abbott enzymatic A1c assay, certified by   theNGSP.  A1c results, regardless of method, are affected both by mean average glucose   concentrations and by RBC lifespan.       HX HEPATITIS C ANTIBODY   Date/Time Value Ref Range Status   04/22/2020 02:18 PM Non Reactive Non Reactive Final     Comment:     The positive predictive value of this antibody test varies  with the prevalence of Hepatitis C virus.  Clinical confirmation  of Hepatitis C using a qualitative Hepatitis C RNA determination  may be indicated.     04/22/2020 02:18 PM Non Reactive Non Reactive Final     Comment:      The positive predictive value of this antibody test varies  with the prevalence of Hepatitis C virus.  Clinical confirmation  of Hepatitis C using a qualitative Hepatitis C RNA determination  may be indicated.          ASSESSMENT & PLAN:   60YF patient of Dr. Craige Cotta with CREST, IgG4 deficiency, Seronegative RA, Stage II fibrosis of Liver, PUD, history of DVT in 2006, completed short course of coumadin, T2DM presents today to follow up and new concerns:    Connelly was seen today for follow-up.  Type 2 diabetes mellitus without complication, without long-term current use of insulin (CMS/HCC)  Assessment & Plan:  Follows with Dr. Ephriam Jenkins  - medications: metformin full dose and jardiance 10mg  daily. Has been under good control. Will check A1c and urine microalbumin today  - BP: stable, on lisinopril 2.5mg   - eye appt - reports compliance to yearly eye exam  - encouraged continued adherence and lifestyle modifications. Continues on chronic steroids so will also need close follow up on sugar as well  - LDL - No results found for: LDLCALC  - HGBA1C - No results found for: HGBA1C  - urine microalbumin - No results found for: MICROALBUR, [id number]  - return to clinic 3 months    Orders:  -     Lipid panel; Future  -  Hemoglobin A1c; Future  -     Albumin, Urine, Random (Microalbumin Random, Including Creatinine); Future  -     Albumin, Urine, Random (Microalbumin Random, Including Creatinine)  Urge incontinence  Assessment & Plan:  Describes extreme urgency and needing to run to the bathroom to avoid leaking  -discussed behavioral modifications such as avoiding bladder triggers (soda, citrus products, caffeine)  -keep a voiding diary, aim for timed voiding every 2 hrs  -recommend doing kegels daily  -recommend pelvic floor PT, patient is here during the holiday season and is returning back to [facility]. I gave patient an external PT referral form for pelvic floor PT and urged her to find a specialist in [place]  Orders:  -     EXT   Physical Therapy Referral; Future  Leg swelling  Assessment & Plan:  Low suspicion for Acute DVT however does have varicose veins and tenderness along the calf and front of the leg/near shin. Patient also have history of DVT in the past, same leg and recently just returned on a flight from [facility] (although a short flight, she was sitting the whole time and was transported on a wheelchair through the airport)  Patient reports due to not having her achilles on the left leg, her calf muscles have atrophied so R leg is always bigger than L which is the case on my exam today    Will obtain duplex U/S to rule out acute DVT today  Urged use of compression stockings in general due to her varicose veins  Will reach out to patient with results    Orders:  -     Vascular US lower extremity venous duplex right; Future  Current chronic use of systemic steroids  Assessment & Plan:  DEXA due this year  Order placed  Last done 2 years ago with normal bone mass  Recommend continued vitamin D and calcium supplementation  Orders:  -     BD DEXA AXIAL; Future  Chronic pain syndrome  Assessment & Plan:  Chronic pain 2/2 sclerodemra, scoliosis, IgG4 disease, RA  Currently well-controlled on:   Oxycodone 10mg . Used to be BID and since last two refills has been tolerating once a day well   Pregabalin 50 mg BID, and baclofen as needed.    opioid contract signed in legacy system in March of this year  Continue to work closely with patient to wean oxycodone  UDS sent today      PDMP reviewed - last filled 01/24/21 28 tabs/28 day day supply  Okay to fill. Will send to pharmacy      Orders:  -     UDS, Urine Drug Screen; Future  -     UDS, Urine Drug Screen  Screening mammogram for breast cancer  -     BI BILATERAL MAMMOGRAM SCREENING; Future  Spinal stenosis, lumbar region, with neurogenic claudication  -     oxyCODONE (Roxicodone) 10 mg immediate release tablet; Take 1 tablet (10 mg) by mouth in the morning.  -     baclofen (Lioresal) 10 mg  tablet; Take 1 tablet (10 mg) by mouth in the morning and at bedtime.  Left hip pain  -     oxyCODONE (Roxicodone) 10 mg immediate release tablet; Take 1 tablet (10 mg) by mouth in the morning.  Seronegative rheumatoid arthritis (CMS/HCC)  -     oxyCODONE (Roxicodone) 10 mg immediate release tablet; Take 1 tablet (10 mg) by mouth in the morning.  Primary insomnia  -  traZODone (Desyrel) 100 mg tablet; Take 2 tablets (200 mg) by mouth if needed at bedtime for sleep.     Routine Healthcare Maintenance  -COVID: up to date with booster  -TDAP: up to date  -Flu: up to date  -Colon CA: colo ordered by GI today  -Breast CA: needs mammo, ordered  -PAP: 2020, normal neg HPV  -DEXA: on chronic steroids, Normal bone mass (02/01/2018), ordered today  - PNA: will receive booster dose at 67  - Shingles: up to date  - Depression: PHQ-2:  Over the past 2 weeks, how often have you been bothered by any of the following problems?  Little interest or pleasure in doing things: Not at all  Feeling down, depressed, or hopeless: Not at all  Patient Health Questionnaire-2 Score: 0     Follow up: Return in December as scheduled for annual PE with PCP and follow up on active problems      Immunization History   Administered Date(s) Administered   . COVID-19 Moderna Monovalent 05/20/2019, 06/17/2019, 12/09/2019, 07/29/2020   . Influenza, Injectable, MDCK, preservative free 12/31/2013   . Influenza, Unspecified 02/15/2006, 04/20/2009, 03/24/2010, 01/12/2011, 06/14/2012, 06/24/2012, 06/28/2012, 01/24/2019   . Influenza, injectable, quadrivalent, preservative free 01/05/2016, 01/09/2017, 01/17/2019, 02/03/2020   . Influenza, seasonal, injectable 01/04/2017, 01/04/2018   . Pneumococcal Polysaccharide PPV23 01/26/2017   . Td (adult), unspecified 07/16/2009   . Tdap 11/04/2014   . Zoster, Recombinant 09/14/2017          Quency Tober Darrell Jewel, MPH  [facility]

## 2021-02-23 NOTE — Telephone Encounter (Signed)
Created in error

## 2021-02-23 NOTE — Patient Instructions (Signed)
PRE-PROCEDURE INSTRUCTIONS  FOR COLONOSCOPY  GoLytely (Polyethylene Glycol 2230 and electrolytes)      Welcome to the [facility] GI Endoscopy Unit. We would like to make your stay as pleasant and safe as possible. Please read all instructions carefully before your procedure as they are critical to your health and safety. Failure to follow these directions may lead to a cancelled colonoscopy.    WHAT TO EXPECT     A colonoscopy is an examination of the colon (large intestine) with a flexible instrument called a colonoscope. The colonoscope has a light at the end of the device with a camera to allow direct visualization of the colon lining. A colonoscopy is performed to evaluate symptoms of diarrhea, constipation, rectal bleeding, lower abdominal pain, or to screen for colon cancer and colon polyps (growths in the colon). The colonoscopy is performed by a doctor (gastroenterologist) trained in endoscopy. The colonoscopy is performed after ingesting a laxative solution which will cleanse the colon. You will be given medication prior and during the procedure to make you comfortable.  Most patients fall asleep or nap, and you will most likely have little or no awareness of the procedure.  GETTING HERE    We are located on the third floor in the [facility] building.     Plan to spend about three hours in our unit for your procedure. We will do everything possible to avoid a delay, but emergencies may interrupt the schedule. Please arrive 30 minutes prior to the scheduled procedure time.    WHAT TO BRING    When you arrive for your procedure, please bring:  1) Photo ID  2) List of your current medications and allergies and your completed medical questionnaire  3) Insurance referral if required by your insurance company  4) Name, address, phone, and fax number for the doctor(s) you wish to receive a copy of the report  5) Name and phone number of a responsible adult who will bring you home    LEAVE ALL VALUABLES  AT HOME. Only bring items that you need.    Please arrange for an adult escort, 18 years or older, to take you home after the procedure. You will be receiving sedation, and you should not drive until the next day. Your escort does not have to come with you when you check in but MUST meet you in the Endoscopy Unit on [address] when you are ready to go home. You are still required to have an adult escort, 18 years or older, if you plan to take the T, taxi, ride sharing service, THE RIDE, or are walking home. If you do not have an escort on the day of your procedure, your procedure will be CANCELLED and RESCHEDULED.      HOW TO PREPARE    NINE (9) DAYS before your procedure:    . Buy clear liquids for your CLEAR LIQUID DIET.  The clear liquid diet starts the day before your procedure.    Drinking only clear liquids will be a required part of your procedure's preparation. A clear liquid is anything you can see through such as water, tea, black coffee, clear broth, apple juice, Gatorade, or Jell-O (not red!).  DO NOT DRINK milk, cream, dairy products, alcohol (including white wine), or red/purple liquids during this diet.    Arnoldo Morale food for your LOW FIBER DIET.  The low fiber diet starts 5 days before your procedure.    This diet is 4 days long  and starts 5 days before your procedure. On this diet, you cannot eat corn, raw vegetables (for example: carrots, broccoli, lettuce, celery, cucumbers, etc.), SEEDS OR NUTS, or take fiber supplements (Metamucil).    . Buy items to make your prep easier:  Baby wipes: this can help with irritation.  Crystal Light (not red in color) or Ginger Ale: this can make the prep taste better.    . If you have constipation, take narcotic pain medications, or you previously had a colonoscopy and you were told that your colon was not well cleaned out, your preparation for the procedure will require that you take a "PRE PREP" for 3 days with MiraLax before starting the prescription preparation from  your doctor. This is very important for your procedure to be successful.  Please purchase a bottle of Miralax (available over the counter) now to have ready.    EIGHT (8) DAYS before your procedure:    . If you take any BLOOD THINNING MEDICATIONS OTHER THAN ASPIRIN (for example:  Coumadin (Warfarin), Eliquis, Pradaxa, Xarelto, Plavix, Brillinta, Lovenox), contact your primary care physician or heart doctor for instructions on if and when to stop these medications prior to your procedure.  ASPIRIN should be continued prior to the procedure.    . If you have diabetes and take medication to control your blood sugar, contact your primary care physician or diabetes doctor for instructions about how to take your diabetes medication while preparing for the procedure.  [name]   FIVE (5) DAYS before your procedure:    . Plan to purchase the colon preparation at the pharmacy.  We will submit the prescription electronically to the pharmacy we have on file in your medical record.  . Please review and complete the medical questionnaire and medication list (see enclosed).  . Stop taking iron or multivitamins with iron. Iron may darken your stools.   . Start a low fiber diet TODAY.  On this diet, you cannot eat corn, raw vegetables (for example: carrots, broccoli, lettuce, celery, cucumbers, etc.), SEEDS OR NUTS, or take fiber supplements (Metamucil).  . If you have constipation or take narcotic pain medications, your "PRE PREP" with MiraLax (available over the counter), 1 scoop (17 grams) in 8 ounces of water twice daily for 3 days STARTS TOMORROW MORNING.  . If you have previously had a colonoscopy and you were told that your colon was not well cleaned out, your "PRE PREP" with MiraLax (available over the counter), 1 scoop (17 grams) in 8 ounces of water twice daily for 3 days STARTS TOMORROW MORNING.    TWO (2) DAYS before your procedure:    1) Be sure to pick up the colon preparation from your pharmacy.       2) Your treating  physician has selected the bowel preparation most appropriate for your healthcare needs.        **Please read the following bowel prep instructions carefully.  You need to take all of the prep medication and follow the instructions carefully so that your bowels are clear for the colonoscopy.  If your bowels are not clear, your colonoscopy procedure may need to be rescheduled to a later date and would require ANOTHER PREP.**    ONE (1) DAY before your procedure:    3) Follow the instructions given on this form. DO NOT follow instructions on prep bottles.    4) Begin a clear liquid diet for the entire day, no solid food. A clear liquid diet includes any liquids  you can see through such as water, tea, black coffee, clear broth, apple juice, Gatorade, white grape juice, soda, Jell-O (not RED). Do not drink anything RED. Do not drink milk or dairy products.    YOUR BOWEL PREPARATION WILL FOLLOW A "SPLIT DOSE" SCHEDULE    1) Between 6pm-8pm the evening before procedure, drink the FIRST HALF of the prescription colon laxative preparation.  2) On the morning of your procedure, drink the SECOND HALF of the prescription colon laxative preparation.  THIS HALF OF THE PREPARATION MUST BE FINISHED 2-4 HOURS BEFORE SCHEDULED PROCEDURE TIME to avoid delay or cancellation of the procedure.    HELPFUL SUGGESTIONS FOR COLONOSCOPY PREPARATION    . After mixing the preparation, place it in the refrigerator. Some patients feel it is easier to drink when it is cold.  . You can add Crystal Light (any color but red) or Ginger Ale to the preparation. Do not add anything else to the preparation.  . You can suck on lifesavers or hard candy (any color but red) between glasses of prep.  [name] Keep drinking the preparation even if you have not had a bowel movement.   . If you are not tolerating the prep well (nausea, vomiting), wait an hour then start again at a slower pace. If you still cannot tolerate the prep, please call.  You may want to use a  straw to sip the solution.  . Baby wipes can help irritation from repetitive bowel movements.  .   ON THE DAY OF your procedure:  . Take all of your usual medication with a sip of water, unless otherwise instructed by your primary care physician.   [name] STOP CLEAR LIQUIDS 2-4 HOURS BEFORE YOUR PROCEDURE TIME. THEN, DO NOT EAT OR DRINK ANYTHING UNTIL AFTER YOUR PROCEDURE.  . Your bowels MUST be empty to clearly view your colon and remove polyps.   . Please arrive 30 minutes prior to the scheduled time.  . Wear loose fitting comfortable clothes.  . Remember "WHAT TO BRING" list (see page 1 of these instructions).    AFTER your procedure:  . You will be monitored in the Endoscopy Unit recovery area for approximately one hour.   . You will receive diet and medication instruction after your procedure.  . You may return to work the day after the procedure.   . Your escort MUST meet you in the Endoscopy Unit on [address] when you are ready to go home.    IF YOU ARE UNABLE TO KEEP YOUR APPOINTMENT, PLEASE CALL SCHEDULING AT [phone] AT LEAST 48 HOURS AHEAD OF TIME TO RESCHEDULE.  In addition to your enclosed Pre-Procedure Instructions, we also wanted to make you aware of a couple additional things to expect and be prepared for prior to your procedure.  Again, please do not hesitate to contact us directly at [phone] if you have any questions or concerns about these items or any of your instructions enclosed.    1) Please be aware that all female patients between the ages of 45 and 84 who are receiving an anesthetic agent during their procedure will be offered a pre-procedure pregnancy test unless they meet certain exclusion criteria.  Our nurse will discuss this with patients directly.  Should a patient wish to waive this test they will be asked to sign a waiver form.  2) We encourage all patients to contact their insurance company in advance of any procedure to understand all potential financial obligations that  might  result from their visit.  This includes understanding financial responsibility if the procedure is deemed "preventive" or "diagnostic."  Please note the coding of a preventive or diagnostic procedure is generally not determined till after the procedure is completed and it is best to have a conversation with your insurance company in advance.

## 2021-02-23 NOTE — Progress Notes (Signed)
 RXSP MEDICATION ACCESS - MEDICAL BENEFIT    PA approved for [facility] BUY AND BILL per [facility] [phone]    Plan type: Commercial    PA effective from 02/23/2021 to 08/24/2021  PA #: [id number]  LETTER SCANNED TO MEDIA    Signed  Guilford Shi, [name]

## 2021-02-23 NOTE — Assessment & Plan Note (Addendum)
 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 Used to be BID and since last two refills has been tolerating once a day well   Pregabalin 50 mg BID, and baclofen as needed.    opioid contract signed in legacy system in March of this year  Continue to work closely with patient to wean oxycodone  UDS sent today      PDMP reviewed - last filled 01/24/21 28 tabs/28 day day supply  Okay to fill. Will send to pharmacy

## 2021-02-25 ENCOUNTER — Encounter (HOSPITAL_BASED_OUTPATIENT_CLINIC_OR_DEPARTMENT_OTHER): Admitting: Registered Nurse

## 2021-02-25 ENCOUNTER — Other Ambulatory Visit (HOSPITAL_BASED_OUTPATIENT_CLINIC_OR_DEPARTMENT_OTHER): Admitting: Rheumatology

## 2021-02-25 ENCOUNTER — Ambulatory Visit
Admit: 2021-02-25 | Discharge: 2021-02-25 | Payer: PRIVATE HEALTH INSURANCE | Attending: Rheumatology | Primary: Student in an Organized Health Care Education/Training Program

## 2021-02-25 ENCOUNTER — Ambulatory Visit
Payer: PRIVATE HEALTH INSURANCE | Attending: Rheumatology | Primary: Student in an Organized Health Care Education/Training Program

## 2021-02-25 ENCOUNTER — Encounter (HOSPITAL_BASED_OUTPATIENT_CLINIC_OR_DEPARTMENT_OTHER): Admitting: Rheumatology

## 2021-02-25 ENCOUNTER — Encounter (HOSPITAL_BASED_OUTPATIENT_CLINIC_OR_DEPARTMENT_OTHER): Admitting: Physician Assistant

## 2021-02-25 ENCOUNTER — Other Ambulatory Visit

## 2021-02-25 VITALS — BP 118/76 | HR 96 | Ht 63.0 in | Wt 145.9 lb

## 2021-02-25 DIAGNOSIS — M349 Systemic sclerosis, unspecified: Secondary | ICD-10-CM

## 2021-02-25 MED ORDER — predniSONE (Deltasone) 10 mg tablet
10 | ORAL_TABLET | Freq: Every day | ORAL | 2 refills | Status: DC
Start: 2021-02-25 — End: 2021-05-27

## 2021-02-25 NOTE — Progress Notes (Signed)
 [facility]  [facility]  [address]  [address]  [place] [address]  Dept: [phone]  Dept Fax: [phone]     Patient ID: [name] is a 60 y.o. female who presents for No chief complaint on file..    Assessment/Plan   IgG4 deficiency (CMS/HCC)  Assessment & Plan:  She has been off monthly IVIG for 2 months due to insurance issues.  Set to get infusion on Monday.  No recent infections.    Systemic sclerosis, unspecified (CMS/HCC)  Assessment & Plan:  Her Raynaud's and skin disease are stable.  She continues to have esophageal reflux despite proton pump inhibitor therapy.  She has had 2 episodes of diarrhea in the last several months.  She is followed by GI however I have some concern that she has SIBO.  I will ask our pharmacist to look into Rafamixin for 14 days.  She has upcoming appointments with rheumatologists in [place] where she moved.  Orders:  -     predniSONE (Deltasone) 10 mg tablet; Take 2 tablets (20 mg) by mouth in the morning.  Seronegative rheumatoid arthritis (CMS/HCC)  Assessment & Plan:  She has been doing better.  She still has some pain and swelling but much improved.  She saw Dr. [name] who gave some steroid injections which helped. Tolerating cellcept 1500 mg BID, will continue this.  I will give her a short course of prednisone 20 mg daily to see if that will reduce the swelling and pain in her ankle.  The differential diagnosis is thrombophlebitis vs. RA/Scleroderma.        Subjective   Pt presents with a chief complaint of right leg swelling and pain for the last 2-3 weeks that resembles thrombophlebitis.   -When the pain initially started it was localized in the lower right tibia over the tibialis anterior. As it progressed, it radiated into the dorsum of the foot.  -Pain worsens with walking and dorsiflexion.   -Increasing the prednisone dose from 5 to 10g did not provide relief.  -Heat helps relief  the pain in her leg as well as elevation.  -Hands continue to ache, despite lack of symptom control pt notes that her disability was denied.   -Continues to takes Cellcept: 3 times in the am and 3x in the pm.   -Baclofen and oxycodone, have helped with back pain in the past.   -Pt notes diarrhea on a daily basis during the month of October, that is still ongoing. Thought to be due to potential bacterial overgrowth despite daily probiotic use.   -When the patient gets fatigued, knee twitching causes patient to lose balance and collapse.                     Objective    Visit Vitals  BP 118/76   Pulse 96   Ht 1.6 m   Wt 66.2 kg   SpO2 98%   BMI 25.85 kg/m?   BSA 1.72 m?       Physical Exam  Constitutional:       Appearance: Normal appearance.   Eyes:      General: No scleral icterus.  Musculoskeletal:         General: No swelling.      Right lower leg: Swelling and tenderness (right tibia) present.      Right foot: Tenderness present.      Comments: Pain with resisted dorsiflexion.  Moderate  non-pitting edema.   Skin:     General: Skin is warm.          Neurological:      General: No focal deficit present.   Psychiatric:         Mood and Affect: Mood normal.

## 2021-02-25 NOTE — Assessment & Plan Note (Signed)
 Her Raynaud's and skin disease are stable.  She continues to have esophageal reflux despite proton pump inhibitor therapy.  She has had 2 episodes of diarrhea in the last several months.  She is followed by GI however I have some concern that she has SIBO.  I will ask our pharmacist to look into Rafamixin for 14 days.  She has upcoming appointments with rheumatologists in [place] where she moved.

## 2021-02-25 NOTE — Assessment & Plan Note (Signed)
 She has been doing better.  She still has some pain and swelling but much improved.  She saw Dr. [name] who gave some steroid injections which helped. Tolerating cellcept 1500 mg BID, will continue this.  I will give her a short course of prednisone 20 mg daily to see if that will reduce the swelling and pain in her ankle.  The differential diagnosis is thrombophlebitis vs. RA/Scleroderma.

## 2021-02-25 NOTE — Assessment & Plan Note (Signed)
 She has been off monthly IVIG for 2 months due to insurance issues.  Set to get infusion on Monday.  No recent infections.

## 2021-02-26 LAB — FECAL LEUKOCYTES: Gram Stain Result: NONE SEEN

## 2021-02-28 ENCOUNTER — Other Ambulatory Visit (HOSPITAL_BASED_OUTPATIENT_CLINIC_OR_DEPARTMENT_OTHER): Admitting: Rheumatology

## 2021-02-28 ENCOUNTER — Telehealth (HOSPITAL_BASED_OUTPATIENT_CLINIC_OR_DEPARTMENT_OTHER): Admitting: Rheumatology

## 2021-02-28 ENCOUNTER — Other Ambulatory Visit (HOSPITAL_BASED_OUTPATIENT_CLINIC_OR_DEPARTMENT_OTHER): Admitting: Internal Medicine

## 2021-02-28 ENCOUNTER — Ambulatory Visit
Admit: 2021-02-28 | Payer: PRIVATE HEALTH INSURANCE | Primary: Student in an Organized Health Care Education/Training Program

## 2021-02-28 ENCOUNTER — Ambulatory Visit: Attending: Rheumatology | Admitting: Rheumatology

## 2021-02-28 ENCOUNTER — Other Ambulatory Visit

## 2021-02-28 DIAGNOSIS — M340 Progressive systemic sclerosis: Secondary | ICD-10-CM

## 2021-02-28 LAB — COMPREHENSIVE METABOLIC PANEL
ALT: 28 U/L (ref 0–55)
AST: 18 U/L (ref 6–42)
Albumin: 4.5 g/dL (ref 3.2–5.0)
Alkaline phosphatase: 123 U/L (ref 30–130)
Anion Gap: 10 mmol/L (ref 3–14)
BUN: 19 mg/dL (ref 6–24)
Bilirubin, total: 0.7 mg/dL (ref 0.2–1.2)
CO2 (Bicarbonate): 22 mmol/L (ref 20–32)
Calcium: 9.8 mg/dL (ref 8.5–10.5)
Chloride: 104 mmol/L (ref 98–110)
Creatinine: 0.78 mg/dL (ref 0.55–1.30)
Glucose: 220 mg/dL — ABNORMAL HIGH (ref 70–139)
Potassium: 4.3 mmol/L (ref 3.6–5.2)
Protein, total: 7.2 g/dL (ref 6.0–8.4)
Sodium: 136 mmol/L (ref 135–146)
eGFRcr: 88 mL/min/{1.73_m2} (ref >=60)

## 2021-02-28 LAB — CBC WITH DIFFERENTIAL
Basophils %: 0.2 %
Basophils Absolute: 0.02 10*3/uL (ref 0.00–0.22)
Eosinophils %: 0 %
Eosinophils Absolute: 0 10*3/uL (ref 0.00–0.50)
Hematocrit: 43.5 % (ref 32.0–47.0)
Hemoglobin: 13.9 g/dL (ref 11.0–16.0)
Immature Granulocytes %: 0.7 %
Immature Granulocytes Absolute: 0.09 10*3/uL (ref 0.00–0.10)
Lymphocyte %: 4 %
Lymphocytes Absolute: 0.51 10*3/uL — ABNORMAL LOW (ref 0.70–4.00)
MCH: 29.7 pg (ref 26.0–34.0)
MCHC: 32 g/dL (ref 31.0–37.0)
MCV: 92.9 fL (ref 80.0–100.0)
MPV: 9.4 fL (ref 9.1–12.4)
Monocytes %: 4 %
Monocytes Absolute: 0.51 10*3/uL (ref 0.36–0.77)
NRBC %: 0 % (ref 0.0–0.0)
NRBC Absolute: 0 10*3/uL (ref 0.00–2.00)
Neutrophil %: 91.1 %
Neutrophils Absolute: 11.75 10*3/uL — ABNORMAL HIGH (ref 1.50–7.95)
Platelets: 253 10*3/uL (ref 150–400)
RBC: 4.68 M/uL (ref 3.70–5.20)
RDW-CV: 13.9 % (ref 11.5–14.5)
RDW-SD: 47.7 fL (ref 35.0–51.0)
WBC: 12.9 10*3/uL — ABNORMAL HIGH (ref 4.0–11.0)

## 2021-02-28 LAB — ALKALINE PHOSPHATASE, BONE SPECIFIC: Alkaline Phosphatase, Bone Specific: 23.9 ug/L (ref 5.6–29.0)

## 2021-02-28 LAB — C-REACTIVE PROTEIN: CRP: 0.54 mg/L (ref 0.00–7.48)

## 2021-02-28 LAB — SEDIMENTATION RATE, AUTOMATED: Sed Rate: 22 mm/h (ref 0–30)

## 2021-02-28 LAB — MITOCHONDRIAL IGG AUTOANTIBODIES: Mitochondria M2 Ab (IgG): <=20 U (ref <=20.0)

## 2021-02-28 MED ORDER — sodium chloride 0.9 % flush 10 mL
Freq: Once | INTRAMUSCULAR | Status: DC | PRN
Start: 2021-02-28 — End: 2021-02-28
  Administered 2021-02-28: 16:00:00 10 mL via INTRAVENOUS

## 2021-02-28 MED ORDER — sodium chloride 0.9 % flush 10 mL
Freq: Once | INTRAMUSCULAR | Status: AC
Start: 2021-02-28 — End: 2021-02-28
  Administered 2021-02-28: 14:00:00 10 mL via INTRAVENOUS

## 2021-02-28 MED ORDER — immune globulin (human) (Gammagard) infusion 30 g
10 | Freq: Once | INTRAMUSCULAR | Status: AC
Start: 2021-02-28 — End: 2021-02-28
  Administered 2021-02-28: 14:00:00 30 g via INTRAVENOUS

## 2021-02-28 MED FILL — IMMUNE GLOB,GAMMA (IGG) 10 %-GLY-IGA OVER 50 MCG/ML INJECTION SOLUTION: 10 10 % | INTRAMUSCULAR | Qty: 300

## 2021-02-28 NOTE — Telephone Encounter (Signed)
ERROR

## 2021-02-28 NOTE — Telephone Encounter (Signed)
 Pt calling because on 02/25/21 there was a discussion of the pt receiving antibiotics. Pt wants to know if the antibiotics will be part of the infusions or will she need to take them separately. Please contact pt at [phone] to clarify ASAP  thank you

## 2021-02-28 NOTE — Progress Notes (Signed)
 IVIG-hx of rheumatoid arthritis and scleraderma, has been getting ivig @ home, missed past 2 months due to insurance issue.  Presents today with an inc in joint discomfort and rt lower leg swelling neg for clot.    Tolerated tx without difficulty. Plans to try to have next ivig @ home, if not provider will schedule next tx

## 2021-03-01 ENCOUNTER — Encounter (HOSPITAL_BASED_OUTPATIENT_CLINIC_OR_DEPARTMENT_OTHER)

## 2021-03-02 NOTE — Telephone Encounter (Signed)
 Sending on to Dr [name] and Rose Phi as I believe this is his patient.

## 2021-03-03 ENCOUNTER — Other Ambulatory Visit

## 2021-03-04 ENCOUNTER — Telehealth (HOSPITAL_BASED_OUTPATIENT_CLINIC_OR_DEPARTMENT_OTHER): Admitting: Student in an Organized Health Care Education/Training Program

## 2021-03-04 MED ORDER — rifAXIMin (Xifaxan) 550 mg tablet
550 | ORAL_TABLET | Freq: Three times a day (TID) | ORAL | 0 refills | 30.00000 days | Status: AC
Start: 2021-03-04 — End: 2021-03-18

## 2021-03-04 NOTE — Telephone Encounter (Signed)
 Pt call req a call back regarding a FU  of her last visit 11/25 , pt # [phone], thank you.

## 2021-03-04 NOTE — Telephone Encounter (Signed)
 Sounds like she'll get the forms to [place].

## 2021-03-07 ENCOUNTER — Other Ambulatory Visit (HOSPITAL_BASED_OUTPATIENT_CLINIC_OR_DEPARTMENT_OTHER)

## 2021-03-14 ENCOUNTER — Encounter (HOSPITAL_BASED_OUTPATIENT_CLINIC_OR_DEPARTMENT_OTHER): Admitting: Rheumatology

## 2021-03-24 ENCOUNTER — Other Ambulatory Visit

## 2021-03-24 ENCOUNTER — Ambulatory Visit
Payer: PRIVATE HEALTH INSURANCE | Attending: Student in an Organized Health Care Education/Training Program | Primary: Student in an Organized Health Care Education/Training Program

## 2021-03-24 ENCOUNTER — Ambulatory Visit
Admit: 2021-03-24 | Discharge: 2021-03-24 | Payer: PRIVATE HEALTH INSURANCE | Attending: Physician Assistant | Primary: Student in an Organized Health Care Education/Training Program

## 2021-03-24 ENCOUNTER — Ambulatory Visit: Payer: PRIVATE HEALTH INSURANCE | Primary: Student in an Organized Health Care Education/Training Program

## 2021-03-24 ENCOUNTER — Encounter (HOSPITAL_BASED_OUTPATIENT_CLINIC_OR_DEPARTMENT_OTHER): Admitting: Physician Assistant

## 2021-03-24 VITALS — BP 110/69 | HR 84 | Temp 98.0°F | Ht 62.99 in | Wt 146.2 lb

## 2021-03-24 DIAGNOSIS — Z0000 Encounter for general adult medical examination without abnormal findings: Secondary | ICD-10-CM

## 2021-03-24 DIAGNOSIS — G894 Chronic pain syndrome: Secondary | ICD-10-CM

## 2021-03-24 MED ORDER — traZODone (Desyrel) 100 mg tablet
100 | ORAL_TABLET | Freq: Every evening | ORAL | 1 refills | Status: DC | PRN
Start: 2021-03-24 — End: 2021-06-17

## 2021-03-24 NOTE — Assessment & Plan Note (Signed)
 Follows with Dr. Ephriam Jenkins  - medications: metformin full dose and jardiance 10mg  daily. Has been under good control. A1c increased to 6.1  - BP: stable, on lisinopril 2.5mg   - eye appt - reports compliance to yearly eye exam  - encouraged continued adherence and lifestyle modifications. Continues on chronic steroids so will also need close follow up on sugar as well  - LDL -   Lab Results   Component Value Date    LDLCALC 85 02/23/2021     - HGBA1C -   Lab Results   Component Value Date    HGBA1C 6.6 (H) 02/23/2021     - urine microalbumin -   Lab Results   Component Value Date    MICROALBUR 2.0 02/23/2021     - return to clinic 3 months

## 2021-03-24 NOTE — Progress Notes (Signed)
 [facility] [place]  [address]  [address]  [place], [place] [address]  TEL: [phone]    PATIENT: [name]  MRN: [id number]   DATE OF APT: 03/28/21   TIME: 8:28 PM  PCP: Baldemar Friday, MD       HISTORY OF PRESENT ILLNESS:   Annual PE:  Has history of Scleroderma - CREST dx 2007, IgG4 deficiency, Seronegative RA, Stage II fibrosis of Liver, PUD, DVT in 2006, multiple back surgeries, diabetes, GERD    Feels better overall, had IVIG in November, one is tomorrow  GI symptoms are also  Better controlled    Her DEXA is tomorrow  Also appt with ortho for hand injection    Reports on 02/27/21 Fell straight on her R knee  Is s/p r knee replacement and has a history of patellar fracture in the past  Since the fall has been having patellar pain even with slight touch  No redness, swelling has resolved since the fall  No signs of patellar dislocation or hypermobility    Labs reviewed from last visit--  A1c with slight increase  Otherwise stable    Review of Systems:   Pertinent positives and negatives as per HPI, otherwise a 13 point review of systems was negative.     PAST MEDICAL HISTORY:     Past Medical History:   Diagnosis Date   . Achilles rupture, left, sequela 09/04/2020   . Bilateral carpal tunnel syndrome    . History of venous thromboembolism 2006    Left upper extremity   . IgG4 deficiency (CMS/HCC)    . Liver fibrosis     Stage II   . Paget's disease of bone    . Peptic ulcer disease    . Scoliosis     s/p left L4-L5 transforaminal lumbar interbody fusion   . Spinal stenosis at L4-L5 level     s/p L4-L5 laminectomy wtih complete facetctomy with decompression (02/2020)     Patient Active Problem List   Diagnosis   . CVID (common variable immunodeficiency) (CMS/HCC)   . Gastroesophageal reflux disease without esophagitis   . IgG4 deficiency (CMS/HCC)   . Systemic sclerosis, unspecified (CMS/HCC)   . Seronegative rheumatoid arthritis (CMS/HCC)   . Spinal stenosis, lumbar region, with neurogenic claudication    . Type 2 diabetes mellitus without complication (CMS/HCC)   . Left hip pain   . Long-term use of high-risk medication   . Abnormal liver function tests   . Hypogammaglobulinemia (CMS/HCC)   . Scoliosis   . Varicose veins of both lower extremities   . Trigger thumb, right thumb   . Arthritis of hand, left   . Diarrhea   . Scleroderma progressive (CMS/HCC)   . Urge incontinence   . Chronic pain syndrome   . Current chronic use of systemic steroids   . Leg swelling   . Primary insomnia   . Screening mammogram for breast cancer   . CREST syndrome (CMS/HCC)   . Elevated alkaline phosphatase level   . Immunocompromised state due to drug therapy (CMS/HCC)   . Paget's disease of bony pelvis   . Osteopenia   . Routine general medical examination at a health care facility   . Acute pain of right knee       PAST SURGICAL HISTORY:     Past Surgical History:   Procedure Laterality Date   . CARPAL TUNNEL RELEASE Left 01/2011   . CHOLECYSTECTOMY  1990   . FOOT SURGERY  Left 2008    Plating of left 3rd metatarsal   . HAND SURGERY  2013   . HAND SURGERY  2015   . HARDWARE REMOVAL Right 2006    shoulder due to infection   . HERNIA REPAIR  1990   . LUMBAR FUSION Left 02/2020    L4-L5 transforaminal lumbar interbody fusion   . LUMBAR LAMINECTOMY  02/2020    L4-L5, with complete left facetectomy and decompression   . ORIF FOOT FRACTURE Left     Left 4th metatarsal   . OSTEOTOMY AND ULNAR SHORTENING Left 1994   . OTHER SURGICAL HISTORY Left 05/2019    Hamstring repair   . REVERSE TOTAL SHOULDER ARTHROPLASTY Left 03/07/2016   . ROTATOR CUFF REPAIR Right 2006   . SHOULDER SURGERY Right 2007    Recurrent infection   . TOTAL KNEE ARTHROPLASTY Bilateral 2009   . TOTAL KNEE ARTHROPLASTY Right 2010    replacement of prior arthroplasty        HOME MEDICATIONS:     Current Outpatient Medications   Medication Instructions   . baclofen (LIORESAL) 10 mg, oral, 2 times daily   . calcium carbonate-vitamin D3 500 mg-5 mcg (200 unit) tablet 1 tablet,  oral, Daily   . cyanocobalamin (VITAMIN B-12) 100 mcg, oral, Daily   . famotidine (PEPCID) 20 mg, oral, Daily PRN   . hydrocortisone 0.5 % ointment 1 application, Topical, 2 times daily PRN   . hydroxychloroquine (Plaquenil) 200 mg tablet TAKE 1 TABLET BY MOUTH EVERY DAY WITH FOOD OR MILK   . immune globulin, human, (Gammagard) infusion Receives infusion every 4 weeks, next dose 8/5   . Jardiance 10 mg, oral, Daily   . ketoconazole (NIZOral) 2 % cream 1 application, Topical, Daily   . lidocaine (Lidoderm) 5 % patch 1 patch, topical (top), Daily, Remove & discard patch within 12 hours or as directed by MD.   . lidocaine (LMX) 4 % cream 1 application, Topical, 4 times daily PRN   . lisinopril 2.5 mg, oral, Daily   . metFORMIN XR (GLUCOPHAGE-XR) 2,000 mg, oral, Daily with breakfast, Do not crush, chew, or split.    . mycophenolate (Cellcept) 500 mg tablet Take 3 tablets in the morning and two tablets at bedtime   . omeprazole (PRILOSEC) 40 mg, oral, 2 times daily before meals   . oxyCODONE (ROXICODONE) 10 mg, oral, Daily   . predniSONE (DELTASONE) 20 mg, oral, Daily   . pregabalin (LYRICA) 50 mg, oral, 2 times daily   . traZODone (DESYREL) 200 mg, oral, Nightly PRN   . ursodiol (Actigall) 500 mg tablet TAKE 1 TABLET BY MOUTH TWICE A DAY FOR 90 DAYS        ALLERGIES:   No Known Allergies     FAMILY  HISTORY:     Family History   Problem Relation Name Age of Onset   . Lung cancer Mother     . Diabetes type II Father     . Heart attack Father     . Other (systemic sclerosis with gangrene of fingers) Brother     . Lung cancer Maternal Grandmother     . Brain Aneurysm Maternal Grandfather     . Lung cancer Paternal Grandmother     . 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  Separated from partner of 21 years in June 2021.       VITALS  AND PHYSICAL EXAM:   Vitals:   Vitals:    03/24/21 1340   BP: 110/69   BP Location: Left arm   Patient Position: Sitting   BP Cuff Size: Adult   Pulse: 84   Temp: 36.7 C (98 F)   TempSrc: Oral   SpO2: 96%   Weight: 66.3 kg   Height: 1.6 m        Physical Exam     LABS:     Lab Results   Component Value Date    HGBA1C 6.6 (H) 02/23/2021      Lab Results   Component Value Date    GLUCOSE 220 (H) 02/28/2021    CALCIUM 9.8 02/28/2021    NA 136 02/28/2021    K 4.3 02/28/2021    CO2 22 02/28/2021    CL 104 02/28/2021    BUN 19 02/28/2021    CREATININE 0.78 02/28/2021      Lab Results   Component Value Date    BUN 19 02/28/2021      Lab Results   Component Value Date    CREATININE 0.78 02/28/2021      Lab Results   Component Value Date    ALT 28 02/28/2021    AST 18 02/28/2021    GGT 268 (H) 10/01/2020    ALKPHOS 123 02/28/2021    BILITOT 0.7 02/28/2021      Lab Results   Component Value Date    CALCIUM 9.8 02/28/2021    No results found for: IRON, TIBC, FERRITIN   No results found for: TSH   Lab Results   Component Value Date    WBC 12.9 (H) 02/28/2021    HGB 13.9 02/28/2021    HCT 43.5 02/28/2021    PLT 253 02/28/2021    CHOL 168 02/23/2021    TRIG 93 02/23/2021    HDL 64 02/23/2021    ALT 28 02/28/2021    AST 18 02/28/2021    NA 136 02/28/2021    K 4.3 02/28/2021    CL 104 02/28/2021    CREATININE 0.78 02/28/2021    BUN 19 02/28/2021    CO2 22 02/28/2021    INR 1.00 10/01/2020    HGBA1C 6.6 (H) 02/23/2021    MICROALBUR 2.0 02/23/2021      HX LDL   Date/Time Value Ref Range Status   06/06/2019 09:38 AM 42.0 0 - 129 mg/dL Final     Comment:     Adult                     Desirable: <130 mg/dL     Borderline: 086-578 mg/dL  High:       >469 mg/dL    An accurate calculated LDL is obtained when the patient  is fasting at least 12 hours.     06/06/2019 09:38 AM 42.0 0 - 129 mg/dL Final     Comment:     Adult                     Desirable: <130 mg/dL     Borderline: 629-528 mg/dL  High:       >413 mg/dL    An accurate  calculated LDL is obtained when the patient  is fasting at least 12  hours.       HX TRIGLYCERIDES   Date/Time Value Ref Range Status   06/06/2019 09:38 AM 109.0 40 - 250 mg/dL Final     Comment:     Adult                     Desirable:  <150 mg/dL     Borderline: 413-244 mg/dL  High:        >010 mg/dL     27/25/3664 40:34 AM 109.0 40 - 250 mg/dL Final     Comment:     Adult                     Desirable:  <150 mg/dL     Borderline: 742-595 mg/dL  High:        >638 mg/dL       HX CHOLESTEROL   Date/Time Value Ref Range Status   06/06/2019 09:38 AM 131.0 110 - 199 mg/dL Final     Comment:     The National Cholesterol Education Program (NCEP) has set the   following guidelines for total cholesterol in adults   ages 6 and up:   Desirable:       <200 mg/dL   Borderline high: 756-433 mg/dL   High:            > or = 240 mg/dL  For children up to and including the age of 40:   Desirable:      <170 mg/dL   Borderline IRJJ:884-166 mg/dL   High:           > or = 200 mg/dL     10/01/1599 09:32 AM 131.0 110 - 199 mg/dL Final     Comment:     The National Cholesterol Education Program (NCEP) has set the   following guidelines for total cholesterol in adults   ages 9 and up:   Desirable:       <200 mg/dL   Borderline high: 355-732 mg/dL   High:            > or = 240 mg/dL  For children up to and including the age of 27:   Desirable:      <170 mg/dL   Borderline KGUR:427-062 mg/dL   High:           > or = 200 mg/dL       HX HEMOGLOBIN B7S   Date/Time Value Ref Range Status   04/22/2020 02:18 PM 6.1 (H) *<5.7 % Final     Comment:     This test was performed using the Abbott enzymatic A1c assay, certified by   theNGSP.  A1c results, regardless of method, are affected both by mean average glucose   concentrations and by RBC lifespan.     04/22/2020 02:18 PM 6.1 (H) *<5.7 % Final     Comment:     This test was performed using the Abbott enzymatic A1c assay, certified by   theNGSP.  A1c results, regardless of method, are affected both  by mean average glucose   concentrations and by RBC lifespan.       HX HEPATITIS C ANTIBODY   Date/Time Value Ref Range Status   04/22/2020 02:18 PM Non Reactive Non Reactive Final     Comment:     The positive predictive value of this antibody test varies  with the prevalence of Hepatitis C virus.  Clinical confirmation  of Hepatitis C using a qualitative Hepatitis  C RNA determination  may be indicated.     04/22/2020 02:18 PM Non Reactive Non Reactive Final     Comment:     The positive predictive value of this antibody test varies  with the prevalence of Hepatitis C virus.  Clinical confirmation  of Hepatitis C using a qualitative Hepatitis C RNA determination  may be indicated.          ASSESSMENT & PLAN:     Itzell was seen today for annual exam.  Routine general medical examination at a health care facility  Assessment & Plan:  Routine Healthcare Maintenance  -COVID: up to date with booster  -TDAP:11/04/2014  -Flu: up to date, november in [facility]  -Colon CA: Completed 05/2019, due in 2026  -Breast CA: --Mammo (40-74): Negative (09/2019), due in 2023  -PAP: --Pap + HPV: Normal/Negative 01/17/2019, due in 2025  -DEXA: Normal bone mass (02/01/2018), getting it tomorrow  - WUJ:[id number] 01/26/2017. Due for booster at age 50.  - Shingles: up to date  - Depression: PHQ-2:  Over the past 2 weeks, how often have you been bothered by any of the following problems?  Little interest or pleasure in doing things: Not at all  Feeling down, depressed, or hopeless: Not at all  Patient Health Questionnaire-2 Score: 0    Chronic pain syndrome  Assessment & Plan:  2/2 to CREST, scleroderma, RA pain  Currently well-controlled on:   Oxycodone 10mg . Used to be BID and since last two refills has been tolerating once a day well  Pregabalin 50 mg BID, and baclofen as needed.    opioid contract signed in legacy system in March of this year    Seronegative rheumatoid arthritis (CMS/HCC)  Assessment & Plan:  Follows with Rheum  Type 2 diabetes  mellitus without complication, without long-term current use of insulin (CMS/HCC)  Assessment & Plan:  Follows with Dr. Ephriam Jenkins  - medications: metformin full dose and jardiance 10mg  daily. Has been under good control. A1c increased to 6.1  - BP: stable, on lisinopril 2.5mg   - eye appt - reports compliance to yearly eye exam  - encouraged continued adherence and lifestyle modifications. Continues on chronic steroids so will also need close follow up on sugar as well  - LDL -   Lab Results   Component Value Date    LDLCALC 85 02/23/2021     - HGBA1C -   Lab Results   Component Value Date    HGBA1C 6.6 (H) 02/23/2021     - urine microalbumin -   Lab Results   Component Value Date    MICROALBUR 2.0 02/23/2021     - return to clinic 3 months    Acute pain of right knee  Comments:  Larey Seat in November on R knee and has continued anterior knee pain. She is s/p replacement and has h/o patellar fracture. Will obtain XRAY  Orders:  -     XR KNEE RIGHT 3 VIEWS; Future  Primary insomnia  -     traZODone (Desyrel) 100 mg tablet; Take 2 tablets (200 mg) by mouth if needed at bedtime for sleep.  Paget's disease of bony pelvis  Assessment & Plan:  Had MRI pelvis last year due to incidental finding of sclerotic lesion   MR with finding of paget's on superior pubic ramus and pubic symphysis. Stable over the last 10 years.   Consulted [name] (rheum) who does not think it is paget's, no further work up is necessary  Immunization History   Administered Date(s) Administered   . COVID-19 Moderna Monovalent 05/20/2019, 06/17/2019, 12/09/2019, 07/29/2020   . Influenza, Injectable, MDCK, preservative free 12/31/2013   . Influenza, Unspecified 02/15/2006, 04/20/2009, 03/24/2010, 01/12/2011, 06/14/2012, 06/24/2012, 06/28/2012, 01/24/2019   . Influenza, injectable, quadrivalent, preservative free 01/05/2016, 01/09/2017, 01/17/2019, 02/03/2020   . Influenza, seasonal, injectable 01/04/2017, 01/04/2018   . Pneumococcal Polysaccharide  PPV23 01/26/2017   . Td (adult), unspecified 07/16/2009   . Tdap 11/04/2014   . Zoster, Recombinant 09/14/2017      Return for follow up in FEB    Siria Calandro Darrell Jewel, MPH  [facility]

## 2021-03-24 NOTE — Assessment & Plan Note (Signed)
Follows with Rheum

## 2021-03-24 NOTE — Assessment & Plan Note (Addendum)
 2/2 to CREST, scleroderma, RA pain  Currently well-controlled on:   Oxycodone 10mg . Used to be BID and since last two refills has been tolerating once a day well  Pregabalin 50 mg BID, and baclofen as needed.    opioid contract signed in legacy system in March of this year

## 2021-03-25 ENCOUNTER — Ambulatory Visit: Admit: 2021-03-25 | Discharge: 2021-03-25 | Payer: PRIVATE HEALTH INSURANCE | Attending: Hand Surgery

## 2021-03-25 ENCOUNTER — Encounter (HOSPITAL_BASED_OUTPATIENT_CLINIC_OR_DEPARTMENT_OTHER): Admitting: Rheumatology

## 2021-03-25 ENCOUNTER — Encounter (HOSPITAL_BASED_OUTPATIENT_CLINIC_OR_DEPARTMENT_OTHER): Admitting: Hand Surgery

## 2021-03-25 ENCOUNTER — Inpatient Hospital Stay
Admit: 2021-03-25 | Payer: PRIVATE HEALTH INSURANCE | Primary: Student in an Organized Health Care Education/Training Program

## 2021-03-25 ENCOUNTER — Ambulatory Visit
Admit: 2021-03-25 | Discharge: 2021-03-25 | Payer: PRIVATE HEALTH INSURANCE | Primary: Student in an Organized Health Care Education/Training Program

## 2021-03-25 ENCOUNTER — Encounter (HOSPITAL_BASED_OUTPATIENT_CLINIC_OR_DEPARTMENT_OTHER): Admitting: Physician Assistant

## 2021-03-25 ENCOUNTER — Ambulatory Visit
Admit: 2021-03-25 | Discharge: 2021-03-25 | Payer: PRIVATE HEALTH INSURANCE | Attending: Rheumatology | Primary: Student in an Organized Health Care Education/Training Program

## 2021-03-25 ENCOUNTER — Ambulatory Visit: Payer: PRIVATE HEALTH INSURANCE | Primary: Student in an Organized Health Care Education/Training Program

## 2021-03-25 VITALS — BP 144/86 | HR 72 | Ht 62.99 in | Wt 146.2 lb

## 2021-03-25 DIAGNOSIS — M340 Progressive systemic sclerosis: Secondary | ICD-10-CM

## 2021-03-25 DIAGNOSIS — M79642 Pain in left hand: Secondary | ICD-10-CM

## 2021-03-25 DIAGNOSIS — M349 Systemic sclerosis, unspecified: Secondary | ICD-10-CM

## 2021-03-25 DIAGNOSIS — Z7952 Long term (current) use of systemic steroids: Secondary | ICD-10-CM

## 2021-03-25 DIAGNOSIS — M79641 Pain in right hand: Secondary | ICD-10-CM

## 2021-03-25 DIAGNOSIS — M25561 Pain in right knee: Secondary | ICD-10-CM

## 2021-03-25 MED ORDER — sodium chloride 0.9 % flush 10 mL
Freq: Once | INTRAMUSCULAR | Status: DC | PRN
Start: 2021-03-25 — End: 2021-03-25

## 2021-03-25 MED ORDER — lidocaine (Xylocaine) 10 mg/mL (1 %) injection 1 mL
10 | Freq: Once | INTRAMUSCULAR | Status: AC | PRN
Start: 2021-03-25 — End: 2021-03-25
  Administered 2021-03-25: 17:00:00 1 mL via INTRAMUSCULAR

## 2021-03-25 MED ORDER — betamethasone acet,sod phos (Celestone) injection 6 mg
6 | Freq: Once | INTRAMUSCULAR | Status: AC | PRN
Start: 2021-03-25 — End: 2021-03-25
  Administered 2021-03-25: 17:00:00 6 mg via INTRA_ARTICULAR

## 2021-03-25 MED ORDER — immune globulin (human) (Gammagard) infusion 30 g
10 | Freq: Once | INTRAMUSCULAR | Status: AC
Start: 2021-03-25 — End: 2021-03-25
  Administered 2021-03-25: 18:00:00 30 g via INTRAVENOUS

## 2021-03-25 MED ORDER — sodium chloride 0.9 % flush 10 mL
Freq: Once | INTRAMUSCULAR | Status: DC
Start: 2021-03-25 — End: 2021-03-25

## 2021-03-25 MED FILL — IMMUNE GLOB,GAMMA (IGG) 10 %-GLY-IGA OVER 50 MCG/ML INJECTION SOLUTION: 10 10 % | INTRAMUSCULAR | Qty: 300

## 2021-03-25 NOTE — Progress Notes (Signed)
 Received IVIG without difficulty, IV d/c +blood return site CDI with DSD. Left IC in stable condition, will return 1/23.

## 2021-03-25 NOTE — Assessment & Plan Note (Addendum)
 Her Raynaud's and skin disease are stable.  She continues to have esophageal reflux despite proton pump inhibitor therapy.  Her Gi symptoms responded effectively to Rafamixin.   She has upcoming appointments with rheumatologists in [place] where she moved.  Recommend she continue mycophenolate for now.  She had labs done 1 month ago show she does not need any new ones today.  There is no evidence of toxicity on those labs.

## 2021-03-25 NOTE — Progress Notes (Signed)
 [facility] Note    Patient Name: [name]  MRN: [id number]    Chief Complaint:   Bilateral hand pain    History of Present Illness:  This is a 60 year old female with a history of scleroderma and seronegative RA who is presenting for follow-up for bilateral hand pain.  She was last seen here on 12/10/2020 when she was having bilateral index finger MCP joint pain, both of these joints were injected and she feels like the pain is improved greatly.  She now presents with pain that is located to left worse than right middle finger PIP joints.  She is having pain that starts at the PIP joint radiates distally to the DIP joint in both fingers.  She notes also flexion contractures of the PIP joints and thumb swelling in both lower joints.  She does not feel like it the joints are triggering.  She denies pain in her other fingers.  Denies any numbness or tingling.  Of note she did have a fall recently and possibly injured her right knee which was status post total knee replacement.  She had x-rays of that today per her PCP.    Allergies:  No Known Allergies    Medical History:  Past Medical History:   Diagnosis Date   . Achilles rupture, left, sequela 09/04/2020   . Bilateral carpal tunnel syndrome    . History of venous thromboembolism 2006    Left upper extremity   . IgG4 deficiency (CMS/HCC)    . Liver fibrosis     Stage II   . Paget's disease of bone    . Peptic ulcer disease    . Scoliosis     s/p left L4-L5 transforaminal lumbar interbody fusion   . Spinal stenosis at L4-L5 level     s/p L4-L5 laminectomy wtih complete facetctomy with decompression (02/2020)       Surgical History:  Past Surgical History:   Procedure Laterality Date   . CARPAL TUNNEL RELEASE Left 01/2011   . CHOLECYSTECTOMY  1990   . FOOT SURGERY Left 2008    Plating of left 3rd metatarsal   . HAND SURGERY  2013   . HAND SURGERY  2015   . HARDWARE REMOVAL Right 2006    shoulder due to infection   . HERNIA REPAIR  1990   . LUMBAR  FUSION Left 02/2020    L4-L5 transforaminal lumbar interbody fusion   . LUMBAR LAMINECTOMY  02/2020    L4-L5, with complete left facetectomy and decompression   . ORIF FOOT FRACTURE Left     Left 4th metatarsal   . OSTEOTOMY AND ULNAR SHORTENING Left 1994   . OTHER SURGICAL HISTORY Left 05/2019    Hamstring repair   . REVERSE TOTAL SHOULDER ARTHROPLASTY Left 03/07/2016   . ROTATOR CUFF REPAIR Right 2006   . SHOULDER SURGERY Right 2007    Recurrent infection   . TOTAL KNEE ARTHROPLASTY Bilateral 2009   . TOTAL KNEE ARTHROPLASTY Right 2010    replacement of prior arthroplasty       Current Medications:  Current Outpatient Medications   Medication Sig Dispense Refill   . baclofen (Lioresal) 10 mg tablet Take 1 tablet (10 mg) by mouth in the morning and at bedtime. 90 tablet 1   . calcium carbonate-vitamin D3 500 mg-5 mcg (200 unit) tablet Take 1 tablet by mouth in the morning.     . cyanocobalamin (Vitamin B-12) 1,000 mcg tablet Take 100 mcg by  mouth in the morning.     . famotidine (Pepcid) 20 mg tablet Take 20 mg by mouth if needed each day for indigestion.     . hydrocortisone 0.5 % ointment Apply 1 application topically if needed in the morning and at bedtime for rash. 28.4 g 11   . hydroxychloroquine (Plaquenil) 200 mg tablet TAKE 1 TABLET BY MOUTH EVERY DAY WITH FOOD OR MILK 90 tablet 1   . immune globulin, human, (Gammagard) infusion Receives infusion every 4 weeks, next dose 8/5     . Jardiance 10 mg Take 10 mg by mouth in the morning.     [name] ketoconazole (NIZOral) 2 % cream Apply 1 application topically in the morning. 30 g 1   . lidocaine (Lidoderm) 5 % patch APPLY 1 PATCH TOPICALLY IN THE MORNING. REMOVE & DISCARD PATCH WITHIN 12 HOURS OR AS DIRECTED BY MD. 30 patch 3   . lidocaine (LMX) 4 % cream Apply 1 application topically if needed in the morning, at noon, in the evening, and at bedtime.     [name] lisinopril 2.5 mg tablet Take 1 tablet (2.5 mg) by mouth in the morning. 90 tablet 1   . metFORMIN XR  (Glucophage-XR) 500 mg 24 hr tablet Take 4 tablets (2,000 mg) by mouth with breakfast. Do not crush, chew, or split. 360 tablet 3   . mycophenolate (Cellcept) 500 mg tablet Take 3 tablets in the morning and two tablets at bedtime 450 tablet 1   . omeprazole (PriLOSEC) 40 mg DR capsule Take 1 capsule (40 mg) by mouth before breakfast and before evening meal. 180 capsule 2   . oxyCODONE (Roxicodone) 10 mg immediate release tablet Take 1 tablet (10 mg) by mouth in the morning. 28 tablet 0   . predniSONE (Deltasone) 10 mg tablet Take 2 tablets (20 mg) by mouth in the morning. 60 tablet 2   . pregabalin (Lyrica) 50 mg capsule TAKE 1 CAPSULE (50 MG) BY MOUTH IN THE MORNING AND AT BEDTIME. 60 capsule 2   . traZODone (Desyrel) 100 mg tablet Take 2 tablets (200 mg) by mouth if needed at bedtime for sleep. 90 tablet 1   . ursodiol (Actigall) 500 mg tablet TAKE 1 TABLET BY MOUTH TWICE A DAY FOR 90 DAYS 180 tablet 2     No current facility-administered medications for this visit.       Social history:  Tobacco Use: Low Risk    . Smoking Tobacco Use: Never   . Smokeless Tobacco Use: Never   . Passive Exposure: Not on file     Alcohol Use: Not on file       Physical Exam:  GENERAL: No acute distress, alert and oriented x3  Examination of her left hand demonstrates skin intact with arthritic deformities of all fingers.  Her index finger MCP joint, middle finger PIP joint, ring finger PIP joint are edematous and synovitic.  She has pain to palpation of the middle finger PIP joint, P2, and middle finger DIP joint however she does not have much pain with range of motion of her middle finger DIP joint.  PIP joint motion is from 25 degrees to 90 degrees with pain at end range of motion, passively motion is from about 20degrees to 95 degrees.  DIP motion is from 0degrees to 10 degrees without pain.  Her sensation is intact to light touch in the median radial and ulnar nerve distributions.  Motor intact radian ulnar and median nerve  distributions.  Her  fingers are warm and well-perfused.  Examination of the right hand demonstrates skin intact with arthritic deformities of all fingers.  Her index finger MCP joint, middle finger PIP joint, ring finger MP joint, are synovitic.  She has pain to palpation of the middle finger PIP joint, P2, and middle finger DIP joint however she does not have pain with range of motion of her middle finger DIP joint.  PIP joint range of motion is from 30 degrees to 75 degrees with pain at end range of motion.  Passively her middle finger PIP range of motion is from 20degrees to 80 degrees.  DIP motion is from 0 degrees to 5 degrees without pain.  Sensation is intact to light touch in the median ulnar and radial nerves regions.  Motor intact median ulnar and radial nerves.  Her fingers are warm and well-perfused.    X-rays of her right knee were reviewed and demonstrate no acute fracture or dislocation, the total knee implants look well aligned without evidence of loosening or wear.    S Inj/Asp: bilateral long PIP on 03/25/2021 12:04 PM  Indications: pain  Details: 25 G needle, lateral approach  Medications (Right): 6 mg betamethasone acet,sod phos 6 mg/mL; 1 mL lidocaine 10 mg/mL (1 %)  Medications (Left): 6 mg betamethasone acet,sod phos 6 mg/mL; 1 mL lidocaine 10 mg/mL (1 %)          Problem List Items Addressed This Visit    None       Assessment/Plan:  Patient seen and examined with Dr. Yetta Numbers.  She has arthritic deformity of both hands, now the left side is affected worse than the right and her middle finger PIP joint seems to be giving her the most pain at this point.  Her index finger MCP joints have greatly improved since her last injection during her last visit.  She would like injections of bilateral middle finger PIP joints today.  She does have pain distally at her middle phalanx and her DIP joint although this may be coming from the PIP joint synovitis.  We will try PIP joint injections and  see if she gets better from here.  She is seeing her rheumatologist later today.  Notes let her know that her knee implants look stable there does not seem to be a fracture though she can follow-up with her total knee surgeon if she feels like the pain is continuing.  All of her questions were answered she can see [place] for follow-up as needed.    Dola Argyle MD  Hand Surgery Fellow

## 2021-03-25 NOTE — Progress Notes (Signed)
 [facility]  [facility]  [address]  Section 3rd Floor  [place] [address]  Dept: [phone]  Dept Fax: [phone]     Patient ID: [name] is a 60 y.o. female who presents for No chief complaint on file..    Assessment/Plan   Systemic sclerosis, unspecified (CMS/HCC)  Assessment & Plan:  Her Raynaud's and skin disease are stable.  She continues to have esophageal reflux despite proton pump inhibitor therapy.  Her Gi symptoms responded effectively to Rafamixin.   She has upcoming appointments with rheumatologists in [place] where she moved.  Recommend she continue mycophenolate for now.  She had labs done 1 month ago show she does not need any new ones today.  There is no evidence of toxicity on those labs.  Orders:  -     EXT  Rheumatology Referral  IgG4 deficiency (CMS/HCC)  -     EXT  Rheumatology Referral  Osteopenia of left hip  Assessment & Plan:  There has been a 23% decrease in her bone density since 2020 according to the bone density scan that looked at her forearm, left hip, and lower back.  And her FRAX for major osteoporotic fracture is 21%, an indication for treatment.  Interventions for osteopenia/ osteoporosis were discussed, Pt was advised that she could be at higher risk of experiecning osteonecrosis of the jaw as a rare side effect of bisphosphonates due to her dental implants, which will need revision soon.   Denosumab infusions every six months was discussed to address her osteopenia as an alternative to bisphosphanates.  She has an appointment with endocrine and I will ask for their help in recommending treatment.  She will follow up with endocrine (Dr. Regino Schultze) in February of 2023.   Seronegative rheumatoid arthritis (CMS/HCC)  Assessment & Plan:  She has having more pain and swelling in her hands recently.  She got injections this morning with Dr. [name] we will see how she responds to those.  She is tolerating  CellCept without too much problem and I recommend we continue that.  If her pain does not improve after the injections we may need to add back 5 to 10 mg of prednisone.  I think this is preferable than considering other Biologics as there are few choices left.  She will contact us in a couple weeks to say how she is doing.  The pain in her leg is much better.  She did have a fall on her right knee and had an x-ray done which has not been read yet.  Hopefully there is no fracture there.        Subjective   [name] is doing well today except for her hands, her middle fingers hurt a good deal, she saw Dr. [name] this morning who administered pain injections. She stopped taking the prednisone because her inflammation seemed to be under control. Pt reports missing her October infusion which may have led to the current flare in her hands. [name] reports curling of her fingers (stuck in flexion with difficulty in extension), Cellcept seem to be controlling inflammation in the whole body except for her hands.   She noted feeling ill ten days ago with symptoms of acid reflux, she is taking omeprazole to address this issue. She received bone density scanning this morning which showed osteopenic changes, especially in her left hip.    She broke her patella back in 2010. She fell the 27th of  November hitting her knee, this led her to get an x-ray. Her legs seem to be doing better with resolution of her erythema nordosum. Her right knee is tender to the touch.   Pt notes her balance is fine but has general weakness.             No Known Allergies  Social History     Tobacco Use   . Smoking status: Never   . Smokeless tobacco: Never   Substance Use Topics   . Alcohol use: Yes     Comment: Rare, social drinker   . Drug use: Never     Review of Systems   Constitutional: Positive for fatigue. Negative for chills and fever.   HENT: Negative for hearing loss, mouth sores, sinus pain and sore throat.    Eyes: Negative for pain,  redness and visual disturbance.   Respiratory: Negative for cough and shortness of breath.    Cardiovascular: Negative for chest pain and palpitations.   Gastrointestinal: Negative for abdominal pain, blood in stool and diarrhea.   Genitourinary: Negative for dysuria and hematuria.   Skin: Positive for color change. Negative for rash.   Neurological: Negative for dizziness, weakness, numbness and headaches.   Psychiatric/Behavioral: Negative for dysphoric mood and sleep disturbance. The patient is not nervous/anxious.          Objective    Visit Vitals  BP (!) 144/86   Pulse 72   Ht 1.6 m   Wt 66.3 kg   SpO2 97%   BMI 25.90 kg/m   BSA 1.72 m       Physical Exam  Constitutional:       Appearance: Normal appearance.   HENT:      Head: Normocephalic and atraumatic.   Eyes:      Conjunctiva/sclera: Conjunctivae normal.   Neurological:      General: No focal deficit present.      Mental Status: She is oriented to person, place, and time.   Psychiatric:         Mood and Affect: Mood normal.

## 2021-03-25 NOTE — Assessment & Plan Note (Signed)
 There has been a 23% decrease in her bone density since 2020 according to the bone density scan that looked at her forearm, left hip, and lower back.  And her FRAX for major osteoporotic fracture is 21%, an indication for treatment.  Interventions for osteopenia/ osteoporosis were discussed, Pt was advised that she could be at higher risk of experiecning osteonecrosis of the jaw as a rare side effect of bisphosphonates due to her dental implants, which will need revision soon.   Denosumab infusions every six months was discussed to address her osteopenia as an alternative to bisphosphanates.  She has an appointment with endocrine and I will ask for their help in recommending treatment.  She will follow up with endocrine (Dr. Regino Schultze) in February of 2023.

## 2021-03-25 NOTE — Assessment & Plan Note (Signed)
 She has having more pain and swelling in her hands recently.  She got injections this morning with Dr. [name] we will see how she responds to those.  She is tolerating CellCept without too much problem and I recommend we continue that.  If her pain does not improve after the injections we may need to add back 5 to 10 mg of prednisone.  I think this is preferable than considering other Biologics as there are few choices left.  She will contact us in a couple weeks to say how she is doing.  The pain in her leg is much better.  She did have a fall on her right knee and had an x-ray done which has not been read yet.  Hopefully there is no fracture there.

## 2021-03-28 DIAGNOSIS — Z0000 Encounter for general adult medical examination without abnormal findings: Secondary | ICD-10-CM

## 2021-03-28 NOTE — Assessment & Plan Note (Signed)
 Routine Healthcare Maintenance  -COVID: up to date with booster  -TDAP:11/04/2014  -Flu: up to date, [facility]  -Colon CA: Completed 05/2019, due in 2026  -Breast CA: --Mammo (40-74): Negative (09/2019), due in 2023  -PAP: --Pap + HPV: Normal/Negative 01/17/2019, due in 2025  -DEXA: Normal bone mass (02/01/2018), getting it tomorrow  - [id number] 01/26/2017. Due for booster at age 60.  - Shingles: up to date  - Depression: PHQ-2:  Over the past 2 weeks, how often have you been bothered by any of the following problems?  Little interest or pleasure in doing things: Not at all  Feeling down, depressed, or hopeless: Not at all  Patient Health Questionnaire-2 Score: 0

## 2021-03-28 NOTE — Assessment & Plan Note (Addendum)
 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 Stable over the last 10 years.   Consulted [name] (rheum) who does not think it is paget's, no further work up is necessary

## 2021-03-30 ENCOUNTER — Other Ambulatory Visit (HOSPITAL_BASED_OUTPATIENT_CLINIC_OR_DEPARTMENT_OTHER): Admitting: Student in an Organized Health Care Education/Training Program

## 2021-03-30 ENCOUNTER — Other Ambulatory Visit

## 2021-03-30 MED ORDER — oxyCODONE (Roxicodone) 10 mg immediate release tablet
10 | ORAL_TABLET | Freq: Every day | ORAL | 0 refills | 8.00000 days | Status: DC
Start: 2021-03-30 — End: 2021-05-10

## 2021-03-30 NOTE — Telephone Encounter (Signed)
Pt is requesting medication to be sent to pharmacy on file. Please contact thank you  [phone]  PS: Pt is requesting this new  medication that was prescribed by another provider and she would like to see if can be refill by her PCP to be sent to the pharmacy. medication name is DICLOFENAC SODIUM 1 percent.

## 2021-03-30 NOTE — Telephone Encounter (Signed)
 PMP reviewed, no other prescribers or unexpected opioid prescriptions  28/28 day supply  Last filled/due:  02/23/21  Next due on/after:  Due for refill    Last appt:   03/24/21  Next appt:   05/20/21  Last UDS: 06/08/20  Last contract: 06/08/20    **see BLUE STICKY NOTE for these dates, please update manually if done recently.    Refill pended for PCP

## 2021-04-06 ENCOUNTER — Telehealth (HOSPITAL_BASED_OUTPATIENT_CLINIC_OR_DEPARTMENT_OTHER): Admitting: Physician Assistant

## 2021-04-06 NOTE — Telephone Encounter (Signed)
Result Communication    Called to discussed DEXA result  FRAX analysis of fracture risk - 21%  Osteopenia with statistical significant decrease in BMD of hip, spine and radius/ulna    Has endocrine appt in Feb to consider IV bisphos therapy considering has severe reflux/would not be able to toleral oral regimen    I advised patient to start intake of adequate calcium (1200mg  via food +/- supplements) and vitamin D daily. She is already doing this but will focus more on choosing dietary sources of calcium as well  I advised weight and strength training/exercises as well    Derrich Gaby, PA       Resulted Orders   BD DEXA AXIAL    Narrative    NAME: [name]  DOB: April 12, 1960 AGE: 61 years  GENDER: Female  MRN: [id number]  ORD PHYS: Daimon Kean  LOCATION: [facility]  03/25/2021 10:56 AM EST  [id number] (BD DEXA AXIAL) [id number]    BONE MINERAL DENSITY STUDY    INDICATION:  Rheumatoid arthritis on glucocorticoids.  61 year old woman with rheumatoid arthritis or, history of wrist, metatarsal, and patellar fractures. History of IgG4 deficiency and systemic sclerosis. The patient is not currently on therapy for osteoporosis.     COMPARISON: 04/12/2018.    TECHNIQUE:  The study was performed using a Hologic Horizon A bone densitometer. Prior exam from 2020 was performed using the Discovery C bone densitometer.    TECHNICAL QUALITY: Technical quality is adequate.    RESULTS:    PA Lumbar Spine:   Bone mineral density (BMD) as determined from L1-L2 is 1.306 g/cm2.  L3 and L4 excluded due to interval spinal fusion hardware.    T-score is 3.0.  Z-score is 4.2.    Comment: On comparison to previous study of 04/12/2018., there has been a decrease of 0.087 g/cm2 or 2.2%.  This change is statistically significant.  At this facility, the least significant change in BMD of the lumbar spine (L1-L4) is 0.022 g/cm2.    Proximal Femur:      Bone mineral density (BMD) as determined in the LEFT femoral neck is 0.674  g/cm2.    T-score is -1.6.  Z-score is -0.4.    Bone mineral density (BMD) as determined in the LEFT total hip is 0.679 g/cm2.    T-score is -2.2.  Z-score is -1.3.    Comment: On comparison to previous study of 04/12/2018., there has been a decrease of 0.208 g/cm2 or 23.4% in the total hip.  This change is statistically significant.  At this facility, the least significant change in BMD of the total hip is 0.027 g/cm2.    Distal Forearm:      Bone mineral density (BMD) as determined in the LEFT  1/3 region is 0.638 g/cm2.    T-score is -0.9.  Z-score is 0.2.    Comment: On comparison to previous study of 04/12/2018., there has been a decrease of 0.097 g/cm2 or 13.2%.  This change is statistically significant.  At this facility, the least significant change in BMD of the proximal 1/3 of the distal radius is 0.023 g/cm2.      FRAX analysis indicates the 10-year fracture risk for a major osteoporotic fracture is 26% and a hip fracture is 3.0%.    FRAX analysis corrected for TBS indicates the 10-year fracture risk for a major osteoporotic fracture is 21.1% and a hip fracture is 1.9%.    *An approximate 3% systemic calibration differential exists between the previous  scan and the current one due to equipment up-grade. Therefore, percent mineralization loss should be decreased by approximately 3%, i.e. an 8% loss likely reflects a true 5% loss.      Impression    The patient has low bone mass.     Since the previous study, there has been statistically significant decrease in the bone mineral density of the lumbar spine.    Since the previous study, there has been statistically significant decrease in the bone mineral density of the hip.    Since the previous study, there has been statistically significant decrease in the bone mineral density of the distal radius and ulna.    Secondary causes of bone loss should be evaluated if clinically indicated since the etiology of low BMD cannot be determined by BMD measurement  alone.    Recommend follow up DXA in 1-2 years.       Please note:     1) The T-score compares the BMD of an individual with the young normal mean and expresses the difference as a standard deviation score.     2) The Z-score compares the BMD of an individual with age-matched, gender-matched, and ethnic-matched controls and expresses the difference as a standard deviation score.     3) The World Health Organization classifies postmenopausal Caucasian women as osteoporotic when T-scores are -2.5 or below, low bone mass when T-scores are between -1.0 and -2.5, and normal when T-scores are -1.0 or above.     4) In untreated postmenopausal women and elderly men there is a strong association between low BMD and the risk of osteoporotic fractures.     5) The National Osteoporosis Foundation recommends pharmacologic therapy for osteoporosis in postmenopausal women and men over 50 who have:    *  A vertebral or hip fracture    *  A DXA hip or spine T-score less than or equal to -2.5    *  Low bone mass and a US-adapted WHO 10 year probability of a hip fracture greater than or equal to 3% or 10 year probability of any major osteoporosis-related fracture greater than or equal to 20%.    *  Patient preferences may indicate treatment for people with 10-year fracture probability above or below these levels.    6) The [employer] of Rheumatology recommends pharmacologic therapy for osteoporosis for chronic glucocorticoid users with T-scores of -2.0 or below.         APPROVED BY STAFF RADIOLOGIST: Marianna Fuss, MD 03/28/2021 2:47 PM EST       3:04 PM

## 2021-04-08 ENCOUNTER — Ambulatory Visit
Payer: PRIVATE HEALTH INSURANCE | Attending: Rheumatology | Primary: Student in an Organized Health Care Education/Training Program

## 2021-04-08 ENCOUNTER — Ambulatory Visit: Payer: PRIVATE HEALTH INSURANCE | Primary: Student in an Organized Health Care Education/Training Program

## 2021-04-08 ENCOUNTER — Encounter (HOSPITAL_BASED_OUTPATIENT_CLINIC_OR_DEPARTMENT_OTHER): Admitting: Rheumatology

## 2021-04-16 ENCOUNTER — Other Ambulatory Visit (HOSPITAL_BASED_OUTPATIENT_CLINIC_OR_DEPARTMENT_OTHER): Admitting: Student in an Organized Health Care Education/Training Program

## 2021-04-19 NOTE — Telephone Encounter (Signed)
Last Visit:03/25/21  Next Visit: 05/20/21  Diagnosis: sclerosis, RA  Labs: 02/28/21

## 2021-04-22 ENCOUNTER — Encounter: Payer: PRIVATE HEALTH INSURANCE | Primary: Student in an Organized Health Care Education/Training Program

## 2021-04-25 ENCOUNTER — Encounter: Payer: PRIVATE HEALTH INSURANCE | Primary: Student in an Organized Health Care Education/Training Program

## 2021-04-25 ENCOUNTER — Ambulatory Visit
Admit: 2021-04-25 | Discharge: 2021-04-25 | Payer: PRIVATE HEALTH INSURANCE | Primary: Student in an Organized Health Care Education/Training Program

## 2021-04-25 ENCOUNTER — Other Ambulatory Visit

## 2021-04-25 DIAGNOSIS — M340 Progressive systemic sclerosis: Secondary | ICD-10-CM

## 2021-04-25 MED ORDER — sodium chloride 0.9 % flush 10 mL
Freq: Once | INTRAMUSCULAR | Status: AC
Start: 2021-04-25 — End: 2021-04-25
  Administered 2021-04-25: 14:00:00 10 mL via INTRAVENOUS

## 2021-04-25 MED ORDER — sodium chloride 0.9 % flush 10 mL
Freq: Once | INTRAMUSCULAR | Status: DC | PRN
Start: 2021-04-25 — End: 2021-04-25
  Administered 2021-04-25: 15:00:00 10 mL via INTRAVENOUS

## 2021-04-25 MED ORDER — immune globulin (human) (Gammagard) infusion 30 g
10 | Freq: Once | INTRAMUSCULAR | Status: AC
Start: 2021-04-25 — End: 2021-04-25
  Administered 2021-04-25: 14:00:00 30 g via INTRAVENOUS

## 2021-04-25 MED FILL — IMMUNE GLOB,GAMMA (IGG) 10 %-GLY-IGA OVER 50 MCG/ML INJECTION SOLUTION: 10 10 % | INTRAMUSCULAR | Qty: 300

## 2021-04-25 NOTE — Progress Notes (Signed)
Ivig q28 days, presents in usual state of health. Joint stiffness, discomfort improved on therapy. States is feeling well. tolerated ivig without issues, no reactions.  Discharged condition stable

## 2021-04-26 ENCOUNTER — Other Ambulatory Visit (HOSPITAL_BASED_OUTPATIENT_CLINIC_OR_DEPARTMENT_OTHER): Admitting: Internal Medicine

## 2021-04-26 NOTE — Telephone Encounter (Signed)
Filled a 30 day supply on 12/19  Ok to fill    Unable to sign this electronically for some reason. Will see if Noreene Larsson can sign  Clifton James, PA

## 2021-04-27 ENCOUNTER — Telehealth (HOSPITAL_BASED_OUTPATIENT_CLINIC_OR_DEPARTMENT_OTHER): Admitting: Rheumatology

## 2021-04-27 NOTE — Telephone Encounter (Signed)
Fax failed twice.

## 2021-04-27 NOTE — Telephone Encounter (Signed)
PT call req if is possible to fax to Disability  the las 2 visit from Rheumatology Fax # [phone] att Melanee Left if any questions please call pt # [phone], thank you.

## 2021-04-28 NOTE — Telephone Encounter (Signed)
Pt calling to let Admin know the fax machine is down and when it is back up and running pt will be calling back to have paperwork re-fax over. Pt mentioned that she wanted Dahlia ClientHannah to know that she was not trying to leave her in the dark or dodge her. Pt mentioned that she  received the call that fax machine was down and just wanted to pass the message along. Pt can be reached at [phone]  Thank you

## 2021-04-28 NOTE — Telephone Encounter (Signed)
Fax failed again. Called pt, she'll call their office to get another fax number.

## 2021-05-02 ENCOUNTER — Telehealth (HOSPITAL_BASED_OUTPATIENT_CLINIC_OR_DEPARTMENT_OTHER): Admitting: Rheumatology

## 2021-05-02 NOTE — Telephone Encounter (Signed)
Noted, will try faxing again. Other encounter is still open so will close this one for now.

## 2021-05-02 NOTE — Telephone Encounter (Signed)
Fax completed.

## 2021-05-02 NOTE — Telephone Encounter (Signed)
pt is calling regarding faxing over notes. pt confirming the fax is working and you can send notes pt spoke with Lorelle Formosa regarding disablity

## 2021-05-03 ENCOUNTER — Telehealth (HOSPITAL_BASED_OUTPATIENT_CLINIC_OR_DEPARTMENT_OTHER): Admitting: Student in an Organized Health Care Education/Training Program

## 2021-05-03 NOTE — Telephone Encounter (Signed)
Pt wanting to know who will be her new provider if Dr Shawnee Knapp is leaving next month. Please contact the pt at [phone]  Thank you

## 2021-05-09 ENCOUNTER — Encounter (HOSPITAL_BASED_OUTPATIENT_CLINIC_OR_DEPARTMENT_OTHER): Admitting: Rheumatology

## 2021-05-10 ENCOUNTER — Other Ambulatory Visit (HOSPITAL_BASED_OUTPATIENT_CLINIC_OR_DEPARTMENT_OTHER): Admitting: Student in an Organized Health Care Education/Training Program

## 2021-05-10 MED ORDER — oxyCODONE (Roxicodone) 10 mg immediate release tablet
10 | ORAL_TABLET | Freq: Every day | ORAL | 0 refills | 8.00000 days | Status: DC
Start: 2021-05-10 — End: 2021-06-17

## 2021-05-10 NOTE — Telephone Encounter (Signed)
PMP reviewed, no other prescribers or unexpected opioid prescriptions  28/28 day supply  Last filled/due:  03/30/21  Next due on/after:   Ok for refill today    Last appt: 03/24/21  Next appt: 05/20/21  Last UDS: 06/08/20  Last contract: 06/08/20    **see BLUE STICKY NOTE for these dates, please update manually if done recently.    Refill pended for PCP

## 2021-05-11 ENCOUNTER — Telehealth (HOSPITAL_BASED_OUTPATIENT_CLINIC_OR_DEPARTMENT_OTHER): Admitting: Rheumatology

## 2021-05-11 NOTE — Telephone Encounter (Signed)
Send notes, labs, echo report

## 2021-05-11 NOTE — Telephone Encounter (Signed)
To Dahlia ClientHannah, [facility], Can you send referal and notes too (other Dr did not work out)  Nov/dec PFP results, echo results and labs anything else  Dr Leamon Arntavid Chetrit   Fx: [phone]  [facility]

## 2021-05-19 ENCOUNTER — Telehealth (HOSPITAL_BASED_OUTPATIENT_CLINIC_OR_DEPARTMENT_OTHER)

## 2021-05-19 ENCOUNTER — Encounter: Payer: PRIVATE HEALTH INSURANCE | Primary: Student in an Organized Health Care Education/Training Program

## 2021-05-19 NOTE — Telephone Encounter (Signed)
Hello,  Patient called to request a refill of: Jardiance for 3 months supply          Pharmacy, pharmacy address, and phone number:  [facility] in [place], [place]. Listed in epic.   Patient last office visit was: 10/01/20 pt has an appt with Dr Regino Schultze tomorrow 05/20/21.     Script prescribed by/last provider seen: Dr Regino Schultze        Informed patient it can take 2 business days to send prescription refill to pharmacy.     Verified insurance with patient OR in Epic (select one)     Call back number:    Thank you!

## 2021-05-20 ENCOUNTER — Encounter (HOSPITAL_BASED_OUTPATIENT_CLINIC_OR_DEPARTMENT_OTHER): Admitting: Rheumatology

## 2021-05-20 ENCOUNTER — Ambulatory Visit
Admit: 2021-05-20 | Discharge: 2021-05-20 | Payer: PRIVATE HEALTH INSURANCE | Attending: Physician Assistant | Primary: Student in an Organized Health Care Education/Training Program

## 2021-05-20 ENCOUNTER — Ambulatory Visit
Admit: 2021-05-20 | Discharge: 2021-05-20 | Payer: PRIVATE HEALTH INSURANCE | Attending: Rheumatology | Primary: Student in an Organized Health Care Education/Training Program

## 2021-05-20 ENCOUNTER — Ambulatory Visit
Admit: 2021-05-20 | Discharge: 2021-05-20 | Payer: PRIVATE HEALTH INSURANCE | Primary: Student in an Organized Health Care Education/Training Program

## 2021-05-20 ENCOUNTER — Encounter: Payer: PRIVATE HEALTH INSURANCE | Primary: Student in an Organized Health Care Education/Training Program

## 2021-05-20 ENCOUNTER — Other Ambulatory Visit
Admit: 2021-05-20 | Payer: PRIVATE HEALTH INSURANCE | Primary: Student in an Organized Health Care Education/Training Program

## 2021-05-20 ENCOUNTER — Inpatient Hospital Stay
Admit: 2021-05-20 | Payer: PRIVATE HEALTH INSURANCE | Primary: Student in an Organized Health Care Education/Training Program

## 2021-05-20 ENCOUNTER — Encounter (HOSPITAL_BASED_OUTPATIENT_CLINIC_OR_DEPARTMENT_OTHER)

## 2021-05-20 ENCOUNTER — Ambulatory Visit: Attending: Rheumatology | Admitting: Rheumatology

## 2021-05-20 ENCOUNTER — Ambulatory Visit: Admit: 2021-05-20 | Discharge: 2021-05-20 | Payer: PRIVATE HEALTH INSURANCE

## 2021-05-20 ENCOUNTER — Other Ambulatory Visit

## 2021-05-20 VITALS — BP 117/75 | HR 98 | Temp 97.4°F | Ht 62.99 in | Wt 150.6 lb

## 2021-05-20 VITALS — BP 117/75 | HR 77 | Ht 62.99 in | Wt 150.1 lb

## 2021-05-20 VITALS — BP 115/72 | HR 89 | Ht 63.0 in | Wt 150.0 lb

## 2021-05-20 DIAGNOSIS — M0600 Rheumatoid arthritis without rheumatoid factor, unspecified site: Secondary | ICD-10-CM

## 2021-05-20 DIAGNOSIS — M340 Progressive systemic sclerosis: Secondary | ICD-10-CM

## 2021-05-20 DIAGNOSIS — E119 Type 2 diabetes mellitus without complications: Secondary | ICD-10-CM

## 2021-05-20 DIAGNOSIS — G894 Chronic pain syndrome: Secondary | ICD-10-CM

## 2021-05-20 DIAGNOSIS — M25522 Pain in left elbow: Secondary | ICD-10-CM

## 2021-05-20 LAB — COMPREHENSIVE METABOLIC PANEL
ALT: 25 U/L (ref 0–55)
AST: 25 U/L (ref 6–42)
Albumin: 3.9 g/dL (ref 3.2–5.0)
Alkaline phosphatase: 85 U/L (ref 30–130)
Anion Gap: 11 mmol/L (ref 3–14)
BUN: 22 mg/dL (ref 6–24)
Bilirubin, total: 0.4 mg/dL (ref 0.2–1.2)
CO2 (Bicarbonate): 21 mmol/L (ref 20–32)
Calcium: 9.2 mg/dL (ref 8.5–10.5)
Chloride: 103 mmol/L (ref 98–110)
Creatinine: 0.54 mg/dL — ABNORMAL LOW (ref 0.55–1.30)
Glucose: 61 mg/dL — ABNORMAL LOW (ref 70–139)
Potassium: 4 mmol/L (ref 3.6–5.2)
Protein, total: 7.6 g/dL (ref 6.0–8.4)
Sodium: 135 mmol/L (ref 135–146)
eGFRcr: 107 mL/min/{1.73_m2} (ref >=60)

## 2021-05-20 LAB — CBC WITH DIFFERENTIAL
Basophils %: 0.3 %
Basophils Absolute: 0.02 10*3/uL (ref 0.00–0.22)
Eosinophils %: 0.7 %
Eosinophils Absolute: 0.04 10*3/uL (ref 0.00–0.50)
Hematocrit: 40.6 % (ref 32.0–47.0)
Hemoglobin: 13 g/dL (ref 11.0–16.0)
Immature Granulocytes %: 0.7 %
Immature Granulocytes Absolute: 0.04 10*3/uL (ref 0.00–0.10)
Lymphocyte %: 13.9 %
Lymphocytes Absolute: 0.82 10*3/uL (ref 0.70–4.00)
MCH: 29.9 pg (ref 26.0–34.0)
MCHC: 32 g/dL (ref 31.0–37.0)
MCV: 93.3 fL (ref 80.0–100.0)
MPV: 10.2 fL (ref 9.1–12.4)
Monocytes %: 1.2 %
Monocytes Absolute: 0.07 10*3/uL — ABNORMAL LOW (ref 0.36–0.77)
NRBC %: 0 % (ref 0.0–0.0)
NRBC Absolute: 0 10*3/uL (ref 0.00–2.00)
Neutrophil %: 83.2 %
Neutrophils Absolute: 4.93 10*3/uL (ref 1.50–7.95)
Platelets: 219 10*3/uL (ref 150–400)
RBC: 4.35 M/uL (ref 3.70–5.20)
RDW-CV: 14.2 % (ref 11.5–14.5)
RDW-SD: 49.1 fL (ref 35.0–51.0)
WBC: 5.9 10*3/uL (ref 4.0–11.0)

## 2021-05-20 LAB — C-REACTIVE PROTEIN: CRP: 0.29 mg/L (ref 0.00–4.99)

## 2021-05-20 LAB — LAVENDER TOP

## 2021-05-20 LAB — POCT HGB A1C: POCT Hemoglobin A1C: 6.1 %

## 2021-05-20 LAB — SEDIMENTATION RATE, AUTOMATED: Sed Rate: 25 mm/h (ref 0–30)

## 2021-05-20 MED ORDER — Jardiance 10 mg
10 | ORAL_TABLET | Freq: Every day | ORAL | 3 refills | Status: AC
Start: 2021-05-20 — End: ?

## 2021-05-20 MED ORDER — sodium chloride 0.9 % flush 10 mL
Freq: Once | INTRAMUSCULAR | Status: AC
Start: 2021-05-20 — End: 2021-05-20
  Administered 2021-05-20: 14:00:00 10 mL via INTRAVENOUS

## 2021-05-20 MED ORDER — sodium chloride 0.9 % flush 10 mL
Freq: Once | INTRAMUSCULAR | Status: DC | PRN
Start: 2021-05-20 — End: 2021-05-20

## 2021-05-20 MED ORDER — immune globulin (human) (Gammagard) infusion 30 g
10 | Freq: Once | INTRAMUSCULAR | Status: AC
Start: 2021-05-20 — End: 2021-05-20
  Administered 2021-05-20: 14:00:00 30 g via INTRAVENOUS

## 2021-05-20 MED FILL — IMMUNE GLOB,GAMMA (IGG) 10 %-GLY-IGA OVER 50 MCG/ML INJECTION SOLUTION: 10 10 % | INTRAMUSCULAR | Qty: 300

## 2021-05-20 NOTE — Progress Notes (Signed)
Endocrinology Clinic Note  [name] is a 61 y.o. female seen in transfer of care for type 2 diabetes.     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   She was previously followed by [name] Part NP, last seen in October 2021.    Through the years, her A1c have been within or near target (6-7s), except for a period in 2017-2019 in which A1c was 8-11% (prednisone)  Since late 2020, A1c's have been within target.    Interval history:   DEXA showed osteopenia. Reviewed communications from [name]. Bone history below.   Prednisone 10mg  daily currently.   Doing fingersticks. Sometimes sclerodactyly and raynaud's gets in the way of glucose checks. Right now skin is ok, no ulcerations, well perfused.   Urine colour returned to normal  Still taking glipizide; stopped previously but restarted sometime in the interim after our last visit  Nausea/vomiting/diarrhea - she had antibiotics in Nov and seemed to help, now seems to be coming back.   No UTIs  No yeast infections    Current diabetes medications:   Metformin 500mg  XR x 4 per day  Jardiance 10mg  daily   Glipizide 2.5mg  XL daily     Glycemic Control:   A1c  6.1% POC today   Self-Monitoring 100-150 generally, occasionally 90s     Hypoglycemia Having some lows in the 60s 2-3x/week if not eating well   Food/exercise Limited activity due to back and hamstring surgery  Does walking and squats   Other factors affecting management Gastroparesis    Prednisone low dose for scleroderma   Elevated liver enzymes - seeing GI, etiology still uncertain. On ursodiol.   After clinic - sensor coverage -   Libre 2 $40/mo  Dexcom sensor $40/mo  Dexcom transmitter $40/mo     Complications:   Nephropathy  N   Retinopathy  N; checked in 2022. Going to be seeing ophthalmologist for plaquenil    Neuropathy  N   Foot ulcer  N   Macrovascular  N   Risk reduction  on ACE inhibitor   Previous notes state  rosuvastatin ?reason discontinued     Bone history:   Has been going for DEXAs over time  Risk factors - long term prednisone use  Has had joint arthritis - replacement. Bilateral knees.   Wrist # in 1985 - needed plate in arm. During judo, bent backwards, initially casted but not aligned enough.   Foot # traumatic  Scoliosis and spinal stenosis. Had spinal fusion surgery L4-L5  Has unpredictable periods of N/V/Diarrhea  Take 1000 units vitamin D3 a day  Takes calcium supplement 1 pill/day  Some calcium in diet - some dairy, beans, leafy green, nuts  No kidney disease  Last vitamin D 38 in 2019  Last calcium 9.8 in 11/22  Family history of hyperthyroidism in mother   Feels hot in any weather    ROS: 10 point ROS negative except noted in history above    PAST MEDICAL HISTORY:   Patient Active Problem List   Diagnosis   . CVID (common variable immunodeficiency) (CMS/HCC)   . Gastroesophageal reflux disease without esophagitis   . IgG4 deficiency (CMS/HCC)   . Systemic sclerosis, unspecified (CMS/HCC)   . Seronegative rheumatoid arthritis (CMS/HCC)   . Spinal stenosis, lumbar region, with neurogenic claudication   . Type 2 diabetes mellitus without complication (CMS/HCC)   . Left hip pain   .  Long-term use of high-risk medication   . Abnormal liver function tests   . Hypogammaglobulinemia (CMS/HCC)   . Scoliosis   . Varicose veins of both lower extremities   . Trigger thumb, right thumb   . Arthritis of hand, left   . Diarrhea   . Scleroderma progressive (CMS/HCC)   . Urge incontinence   . Chronic pain syndrome   . Current chronic use of systemic steroids   . Leg swelling   . Primary insomnia   . Screening mammogram for breast cancer   . CREST syndrome (CMS/HCC)   . Elevated alkaline phosphatase level   . Immunocompromised state due to drug therapy (CMS/HCC)   . Paget's disease of bony pelvis   . Osteopenia   . Routine general medical examination at a health care facility   . Acute pain of right knee     MEDICATIONS:    Current Outpatient Medications:   .  baclofen (Lioresal) 10 mg tablet, Take 1 tablet (10 mg) by mouth in the morning and at bedtime., Disp: 90 tablet, Rfl: 1  .  calcium carbonate-vitamin D3 500 mg-5 mcg (200 unit) tablet, Take 1 tablet by mouth in the morning., Disp: , Rfl:   .  cyanocobalamin (Vitamin B-12) 1,000 mcg tablet, Take 100 mcg by mouth in the morning., Disp: , Rfl:   .  famotidine (Pepcid) 20 mg tablet, Take 20 mg by mouth if needed each day for indigestion., Disp: , Rfl:   .  hydrocortisone 0.5 % ointment, Apply 1 application topically if needed in the morning and at bedtime for rash., Disp: 28.4 g, Rfl: 11  .  hydroxychloroquine (Plaquenil) 200 mg tablet, TAKE 1 TABLET BY MOUTH EVERY DAY WITH FOOD OR MILK, Disp: 90 tablet, Rfl: 1  .  immune globulin, human, (Gammagard) infusion, Receives infusion every 4 weeks, next dose 8/5, Disp: , Rfl:   .  Jardiance 10 mg, Take 10 mg by mouth in the morning., Disp: , Rfl:   .  ketoconazole (NIZOral) 2 % cream, Apply 1 application topically in the morning., Disp: 30 g, Rfl: 1  .  lidocaine (Lidoderm) 5 % patch, APPLY 1 PATCH TOPICALLY IN THE MORNING. REMOVE & DISCARD PATCH WITHIN 12 HOURS OR AS DIRECTED BY MD., Disp: 30 patch, Rfl: 3  .  lidocaine (LMX) 4 % cream, Apply 1 application topically if needed in the morning, at noon, in the evening, and at bedtime., Disp: , Rfl:   .  lisinopril 2.5 mg tablet, Take 1 tablet (2.5 mg) by mouth in the morning., Disp: 90 tablet, Rfl: 1  .  metFORMIN XR (Glucophage-XR) 500 mg 24 hr tablet, Take 4 tablets (2,000 mg) by mouth with breakfast. Do not crush, chew, or split., Disp: 360 tablet, Rfl: 3  .  mycophenolate (Cellcept) 500 mg tablet, TAKE 3 TABLETS IN THE MORNING AND TWO TABLETS AT BEDTIME, Disp: 450 tablet, Rfl: 1  .  omeprazole (PriLOSEC) 40 mg DR capsule, Take 1 capsule (40 mg) by mouth before breakfast and before evening meal., Disp: 180 capsule, Rfl: 2  .  oxyCODONE (Roxicodone) 10 mg immediate release tablet, Take  1 tablet (10 mg) by mouth in the morning., Disp: 28 tablet, Rfl: 0  .  predniSONE (Deltasone) 10 mg tablet, Take 2 tablets (20 mg) by mouth in the morning., Disp: 60 tablet, Rfl: 2  .  pregabalin (Lyrica) 50 mg capsule, TAKE 1 CAPSULE (50 MG) BY MOUTH IN THE MORNING AND AT BEDTIME., Disp: 60 capsule, Rfl:  2  .  traZODone (Desyrel) 100 mg tablet, Take 2 tablets (200 mg) by mouth if needed at bedtime for sleep., Disp: 90 tablet, Rfl: 1  .  ursodiol (Actigall) 500 mg tablet, TAKE 1 TABLET BY MOUTH TWICE A DAY FOR 90 DAYS, Disp: 180 tablet, Rfl: 2    SOCIAL HISTORY:   No smoking. Alcohol - rarely, few times a year, no rec drugs  Lives alone in apartment. Has support from neighbors.   Supplies are covered by insurance, not finding it onerous.     EXAMINATION:  BP 115/72 (BP Location: Right arm, Patient Position: Sitting, BP Cuff Size: Adult)   Pulse 89   Ht 1.6 m   Wt 68 kg   SpO2 98%   BMI 26.57 kg/m    General: Appears well, NAD, well-nourished,  Eyes: No conjunctival injection, normal EOM  HEENT: good dentition, normal hearing  CVS: Normal heart sounds, no murmurs. Peripheral pulses present. No peripheral edema.  Resp: CTAB, normal respiratory effort.   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 Well-perfused, no scarring or gangrene.   Lumbar spine curvature affected by instrumentation. No tenderness.     FLOWSHEET   Date  A1c  LDL-C Urine ACR Cr (eGFR)  BP  Weight  Other   10/21 6.1         06/22 6.4   0.75 (93) 107/76 67.6kg    11/22 6.6         02/23 6.1 85          Lab Results   Component Value Date    HGBA1C 6.1 05/20/2021    MICROALBCREA 32 (H) 02/23/2021    CREATININE 0.78 02/28/2021    EGFR 88 02/28/2021    LDLCALC 85 02/23/2021     Investigations  Date BMD (g/cm2) T-score Notes    Prox femur  total hip  distal forearm  prox femur  total hip  distal forearm    01/20  0.819  0.008  0.735  -0.3  -0.5  0.7    11/22 (1.306) L-spine*  0.674 0.679 3.0 -1.6 -2.2 23.4% decrease in total hip  13.2% decrease in distal forearm  FRAX-TBS 21% MOF / 1.9% Hip #   *L spine - extensive degenerative changes, not reliable    ASSESSMENT AND PLAN  1. Type 2 diabetes with A1c at target. 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     Her A1c remains excellent at 6.1%.  Given that she is well within target and having some lows, we will stop the glipizide,     Plan:   Stop glipizide  Refills for metformin 2g, jardiance 10mg , and test strips up to date.     2.  Osteoporosis  Osteopenic range BMD with high risk for fracture on FRAX TBS, interval severe decreases in the total hip and distal forearm density over the last 2 years, with significant risk factors for fracture including previous fracture, inflammatory disorder, long-term prednisone use.  Additional considerations include scleroderma with esophageal involvement, unpredictable episodes of nausea/vomiting/diarrhea.  We discussed osteoporosis treatment and.  In the setting of steroid-induced osteoporosis, antiresorptive therapy has the best evidence.  Given her scleroderma affecting her GI tract, and baseline GI symptoms, I recommended IV zoledronic acid infusion once a  year for 3 years. We discussed benefits of fracture risk reduction and potential side effects of infusion reaction, myalgia and flu-like symptoms, hypocalcemia, and rare but serious side effects of atypical femoral fractures and ONJ.  She has excellent dental health, and does not have any more dental work planned over the next year.  She does have existing implants and needs to get the crown replaced, but does not have any issue with the screw structurally.  Her dentist has been happy  with her oral health. She elected to proceed with IV zoledronic acid.      Plan:  I will request Reclast infusion.  Discussed side effects and treatment.  I will add TSH and 25 hydroxy vitamin D to her blood work done today.  Kidney function and blood calcium are good.  We will keep in view to do parathyroid hormone testing with next bloodwork to screen for hyperparathyroidism.  Continue vitamin D 1000 units daily and daily calcium intake of 1200 mg through diet and supplement.  Continue weightbearing exercise as tolerated    BP is in target, no changes to lisinopril.  Regarding heat intolerance, I have sent TSH.    Follow-up in 3 months. Call sooner if persistent high BG or clinical change, eg prednisone increase    Ephriam Jenkins, MD   Endocrinology, Diabetes and Metabolism    ---  45 minutes required to complete visit, including reviewing investigations and prior documentation in transfer of care, face-to-face assessment about existing diabetes and new issue (steroid-induced osteoporosis), counselling about bone treatment, discussion, med adjustment, and documentation.

## 2021-05-20 NOTE — Addendum Note (Signed)
Addended by: Durwin GlazeZARZYCKI,  on: 05/20/2021 02:46 PM     Modules accepted: Orders

## 2021-05-20 NOTE — Progress Notes (Signed)
[facility]  [facility]  [address]  [place] [address]  [place] [address]  Dept: [phone]  Dept Fax: [phone]     Patient ID: [name] is a 61 y.o. female who presents for No chief complaint on file..    Assessment/Plan   Seronegative rheumatoid arthritis (CMS/HCC)  Assessment & Plan:  Overall her symptoms do not appear to be improving on current therapies. She continues to have significant joint inflammation and pain in her hands with decreasing response to steroid injections. She is on the maximum dose of CellCept with daily prednisone and hydroxychloroquine. 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 In light of this we discussed tapering her CellCept and monitoring for any flare in symptoms to see if she does need to continue on such a high dose. Will also get an x-ray of her L elbow given swelling and tenderness on exam, although lack of pain with wrist flexion and extension against resistance argues against epicondyle fracture.    Plan:  -check CRP, ESR, CMP, CBC  -L elbow xray  -begin CellCept taper  Orders:  -     C-reactive protein; Future  -     Sedimentation rate, automated; Future  -     Comprehensive metabolic panel; Future  -     CBC and differential; Future  Systemic sclerosis, unspecified (CMS/HCC)  IgG4 deficiency (CMS/HCC)  Pain in left elbow  Assessment & Plan:  X-ray shows possible very small fracture.  Will discuss with orthopedics.  Orders:  -     XR ELBOW LEFT 3+ VIEWS; Future  Nausea vomiting and diarrhea  Assessment & Plan:  The patient contacted me by telephone on 2/20 complaining of nausea and vomiting and diarrhea.  This sounds more like norovirus than SIBO.  I will give her a short course of zofran and asked her to hold HCQ due to  QT-prolongation.  Will treat SIBO after the acute illness is over with.  Orders:  -     ondansetron (Zofran) 8 mg tablet; Take 1 tablet (8 mg) by mouth every 8 (eight) hours if needed for nausea or vomiting for up to 10 days.  -     rifAXIMin (Xifaxan) 550 mg tablet; Take 1 tablet (550 mg) by mouth in the morning, at noon, and at bedtime for 14 days.              Subjective   HPI    She states she has had significant worsening in pain and swelling in fingers and wrist. Has noted some deformities that make it difficult to grasp objects. R wrist swelled so much last week couldn't take bracelet off. Has to use a heat pad every morning to help with severe stiffness. Steroid injections two months ago did not last very long, feels like in general relief doesn't last for as long as in the past. Denies brain fog, word-finding difficulties.     She notes she had a fall in December, fell onto L side with bruising and scratches over whole side. Landed on wrist and had significant swelling of L wrist and elbow. Has had worsening pain and ROM of L elbow. Had not gone to ED or doctor's office so did not get imaging.     Also notes that she has had pain  over R ribs just under her breast that comes and goes, bad enough to cause painful breathing, not affected by position, no obvious trigger.     Endorses continued severe back pain that she uses lidocaine patches for regularly. In past two weeks has had two moments where when lying down has lost sensation below knee on L side. Feels like gait and balance have been off. Notes she has no Achilles on L side so always has difficulty with plantarflexion on that side.     States her Raynauds is better since moving to [place].     Rifaximin had helped a lot with GI symptoms. Last week had an episode of vomiting/retching with diarrhea for a day and half that has since resolved although abdominal pain has continued. Notes early satiety and difficulty eating sufficiently. Doesn't  currently have a GI doctor.     No Known Allergies  Social History     Tobacco Use   . Smoking status: Never   . Smokeless tobacco: Never   Substance Use Topics   . Alcohol use: Yes     Comment: Rare, social drinker   . Drug use: Never     Review of Systems   Constitutional: Positive for appetite change and fatigue.   HENT: Negative for trouble swallowing.    Respiratory: Negative for cough and shortness of breath.    Cardiovascular: Negative for chest pain.   Gastrointestinal: Positive for abdominal pain. Negative for diarrhea, nausea and vomiting.   Musculoskeletal: Positive for arthralgias, back pain, gait problem and joint swelling.   Skin: Positive for color change.         Objective    Visit Vitals  BP 117/75 (BP Location: Right arm, Patient Position: Sitting, BP Cuff Size: Adult)   Pulse 77   Ht 1.6 m   Wt 68.1 kg   SpO2 96%   BMI 26.60 kg/m   BSA 1.74 m       Physical Exam  Constitutional:       Appearance: Normal appearance.   HENT:      Mouth/Throat:      Mouth: Mucous membranes are moist.      Pharynx: Oropharynx is clear.   Eyes:      Conjunctiva/sclera: Conjunctivae normal.   Cardiovascular:      Rate and Rhythm: Normal rate and regular rhythm.      Heart sounds: No murmur heard.    No friction rub. No gallop.   Pulmonary:      Effort: Pulmonary effort is normal.      Breath sounds: No wheezing, rhonchi or rales.   Abdominal:      General: Abdomen is flat.      Palpations: Abdomen is soft.   Musculoskeletal:      Cervical back: Neck supple.   Lymphadenopathy:      Cervical: No cervical adenopathy.   Skin:     General: Skin is warm and dry.   Neurological:      Mental Status: She is alert.               Steffanie Dunn, MS4      I was present with the medical student for the service. I personally verified the history of present illness and performed the physical examination and medical decision making. I have verified all of the medical student's documentation for this encounter.    [name],  MD  05/23/2021  12:11 PM

## 2021-05-20 NOTE — Progress Notes (Signed)
[facility]  [facility]  [address]  [address]  [place] [id number]  Dept: [phone]  Dept Fax: [phone]     Patient ID: Garner NashDebra [name] Winker is a 61 y.o. female who presents for Follow-up.    Subjective   Has history of Scleroderma - CREST dx 2007, IgG4 deficiency, Seronegative RA, Stage II fibrosis of Liver, PUD, DVT in 2006, multiple back surgeries, diabetes, GERD    Medication compliant of all her medications.  Had Rheum appt today, and had xrays of her left elbow due recent swelling, painful nodule forming, warm to touch. Rheum will follow with the results. She reports she had recently fallen on an un-even sidewalk on 12/30 and was doing better until a few weeks ago when it started to hurt.   Also reported GI symptoms flaring up again including nausea,  lower abdominal pain after eating, early satiety, diarrhea but no blood in stool. No vomiting this week but had vomited 2 days last week. Rheum is planning on starting her back on abx    Has ENDO appt after this    #Chronic pain syndrome  2/2 to CREST, scleroderma, RA pain  Currently well-controlled on:   Oxycodone 10mg . Used to be BID and since last two refills has been tolerating once a day well  Pregabalin 50 mg BID, and baclofen as needed.      #Seronegative rheumatoid arthritis   Follows with Rheum for infusions  Recently had DEXA scan    #Type 2 diabetes mellitus without complication, without long-term current use of insulin  Follows with Dr. Ephriam JenkinsLinda Wang, has appt today afterwards  - medications: metformin full dose and jardiance 10mg , glipizide 2.5mg  daily. Has been under good control. A1c increased to 6.1  - BP: stable, on lisinopril 2.5mg   - eye appt - reports compliance to yearly eye exam, due in April  - encouraged continued adherence and lifestyle modifications. Continues on chronic steroids so will also need close follow up on sugar as well  - lipid panel done in Nov, LDL 85  -  microalbumin in Nov, 2.0    #Acute pain of anterior right knee  She is s/p replacement and has h/o patellar fracture. Xray no fracture.   Reports on 02/27/21 Fell straight on her R knee, xrays showed no fracture.  Today reports that pain has improved.     #Primary insomnia  -  Continues to take trazodone    #Paget's disease of bony pelvis  Had MRI pelvis last year due to incidental finding of sclerotic lesion   MR with finding of paget's on superior pubic ramus and pubic symphysis. Stable over the last 10 years.   Consulted Dr. Lorella NimrodHarvey (rheum) who does not think it is paget's, no further work up is necessary      Patient Active Problem List   Diagnosis   . CVID (common variable immunodeficiency) (CMS/HCC)   . Gastroesophageal reflux disease without esophagitis   . IgG4 deficiency (CMS/HCC)   . Systemic sclerosis, unspecified (CMS/HCC)   . Seronegative rheumatoid arthritis (CMS/HCC)   . Spinal stenosis, lumbar region, with neurogenic claudication   . Type 2 diabetes mellitus without complication (CMS/HCC)   . Left hip pain   . Long-term use of high-risk medication   . Abnormal liver function tests   . Hypogammaglobulinemia (CMS/HCC)   . Scoliosis   . Varicose veins of both lower extremities   . Trigger thumb, right thumb   .  Arthritis of hand, left   . Diarrhea   . Scleroderma progressive (CMS/HCC)   . Urge incontinence   . Chronic pain syndrome   . Current chronic use of systemic steroids   . Leg swelling   . Primary insomnia   . Screening mammogram for breast cancer   . CREST syndrome (CMS/HCC)   . Elevated alkaline phosphatase level   . Immunocompromised state due to drug therapy (CMS/HCC)   . Paget's disease of bony pelvis   . Osteopenia   . Routine general medical examination at a health care facility   . Acute pain of right knee   . Steroid-induced osteoporosis   . Nausea vomiting and diarrhea   . Pain in left elbow     Current Outpatient Medications   Medication Instructions   . ascorbic acid (VITAMIN C) 500  mg, oral   . baclofen (LIORESAL) 10 mg, oral, 2 times daily   . calcium carbonate-vitamin D3 500 mg-5 mcg (200 unit) tablet 1 tablet, oral, Daily   . Clinpro 5000 1.1 % paste SMARTSIG:Sparingly To Teeth   . cyanocobalamin (VITAMIN B-12) 100 mcg, oral, Daily   . famotidine (PEPCID) 20 mg, oral, Daily PRN   . hydrocortisone 0.5 % ointment 1 application, Topical, 2 times daily PRN   . hydroxychloroquine (Plaquenil) 200 mg tablet TAKE 1 TABLET BY MOUTH EVERY DAY WITH FOOD OR MILK   . immune globulin, human, (Gammagard) infusion Receives infusion every 4 weeks, next dose 8/5   . Jardiance 10 mg, oral, Daily   . ketoconazole (NIZOral) 2 % cream 1 application, Topical, Daily   . lidocaine (Lidoderm) 5 % patch 1 patch, topical (top), Daily, Remove & discard patch within 12 hours or as directed by MD.   . lidocaine (LMX) 4 % cream 1 application, Topical, 4 times daily PRN   . lisinopril 2.5 mg, oral, Daily   . metFORMIN XR (GLUCOPHAGE-XR) 2,000 mg, oral, Daily with breakfast, Do not crush, chew, or split.    . mycophenolate (Cellcept) 500 mg tablet TAKE 3 TABLETS IN THE MORNING AND TWO TABLETS AT BEDTIME   . omeprazole (PRILOSEC) 40 mg, oral, 2 times daily before meals   . ondansetron (Zofran) 8 mg tablet TAKE 1 TABLET (8 MG) BY MOUTH EVERY 8 HOURS IF NEEDED FOR NAUSEA OR VOMITING FOR UP TO 10 DAYS.   [name] oxyCODONE (ROXICODONE) 10 mg, oral, Daily   . predniSONE (DELTASONE) 20 mg, oral, Daily   . pregabalin (LYRICA) 50 mg, oral, 2 times daily   . rifAXIMin (XIFAXAN) 550 mg, oral, 3 times daily   . traZODone (DESYREL) 200 mg, oral, Nightly PRN   . ursodiol (Actigall) 500 mg tablet TAKE 1 TABLET BY MOUTH TWICE A DAY FOR 90 DAYS     No Known Allergies    Objective   Visit Vitals  BP 117/75   Pulse 98   Temp 36.3 C (97.4 F) (Oral)   Ht 1.6 m   Wt 68.3 kg   SpO2 96%   BMI 26.68 kg/m   BSA 1.74 m       Physical Exam    Assessment/Plan   Matilde was seen today for follow-up.  Chronic pain syndrome  Assessment & Plan:  Currently  well-controlled on:   Oxycodone 10mg . Used to be BID and since last two refills has been tolerating once a day well   Pregabalin 50 mg BID, and baclofen as needed.    - Continue to work closely with patient to  wean oxycodone  - UDS up to date in Nov, appropriate.   - Will update contract today.         Orders:  -     UDS, Urine Drug Screen; Future  Type 2 diabetes mellitus without complication, without long-term current use of insulin (CMS/HCC)  Assessment & Plan:  - a1c has been under good control  - patient has follow up with Endo today  - has eye exam in april  - lipid panel done in Nov, stable LDL 85  - microalbumin 2.0 in Nov  - has good foot care  Orders:  -     POCT glycosylated hemoglobin (Hb A1C) docked device  Gastroesophageal reflux disease without esophagitis  Assessment & Plan:  - continue current regimen  - will check Rheum labs ordered today to also check for h. pylori  Primary insomnia  Assessment & Plan:  - continue taking trazodone  Acute pain of right knee  Assessment & Plan:  - has significantly improved since fall in Nov 2022  Seronegative rheumatoid arthritis (CMS/HCC)  Assessment & Plan:  - continue current regimen and follow up with Rheum

## 2021-05-20 NOTE — Progress Notes (Signed)
Ms [name] presents for IVIG feeling well; IVIG infused per protocol beginning at 0.02 mg/kg and titrated to .08mg /kg -tolerated infusion without difficulty; vs stable following infusion; [place] and will return 06/20/21 for next infusion

## 2021-05-21 ENCOUNTER — Encounter (HOSPITAL_BASED_OUTPATIENT_CLINIC_OR_DEPARTMENT_OTHER)

## 2021-05-21 LAB — TSH: TSH: 1.76 u[IU]/mL (ref 0.27–4.94)

## 2021-05-23 ENCOUNTER — Other Ambulatory Visit (HOSPITAL_BASED_OUTPATIENT_CLINIC_OR_DEPARTMENT_OTHER): Admitting: Rheumatology

## 2021-05-23 MED ORDER — rifAXIMin (Xifaxan) 550 mg tablet
550 | ORAL_TABLET | Freq: Three times a day (TID) | ORAL | 0 refills | 30.00000 days | Status: AC
Start: 2021-05-23 — End: 2021-06-06

## 2021-05-23 MED ORDER — ondansetron (Zofran) 8 mg tablet
8 | ORAL_TABLET | Freq: Three times a day (TID) | ORAL | 1 refills | Status: DC | PRN
Start: 2021-05-23 — End: 2021-05-24

## 2021-05-23 NOTE — Assessment & Plan Note (Signed)
The patient contacted me by telephone on 2/20 complaining of nausea and vomiting and diarrhea.  This sounds more like norovirus than SIBO.  I will give her a short course of zofran and asked her to hold HCQ due to QT-prolongation.  Will treat SIBO after the acute illness is over with.

## 2021-05-23 NOTE — Assessment & Plan Note (Signed)
X-ray shows possible very small fracture.  Will discuss with orthopedics.

## 2021-05-23 NOTE — Assessment & Plan Note (Signed)
Overall her symptoms do not appear to be improving on current therapies. She continues to have significant joint inflammation and pain in her hands with decreasing response to steroid injections. She is on the maximum dose of CellCept with daily prednisone and hydroxychloroquine. 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 In light of this we discussed tapering her CellCept and monitoring for any flare in symptoms to see if she does need to continue on such a high dose. Will also get an x-ray of her L elbow given swelling and tenderness on exam, although lack of pain with wrist flexion and extension against resistance argues against epicondyle fracture.    Plan:  -check CRP, ESR, CMP, CBC  -L elbow xray  -begin CellCept taper

## 2021-05-24 ENCOUNTER — Other Ambulatory Visit

## 2021-05-24 ENCOUNTER — Encounter (HOSPITAL_BASED_OUTPATIENT_CLINIC_OR_DEPARTMENT_OTHER)

## 2021-05-24 NOTE — Assessment & Plan Note (Signed)
-   a1c has been under good control  - patient has follow up with Endo today  - has eye exam in april  - lipid panel done in Nov, stable LDL 85  - microalbumin 2.0 in Nov  - has good foot care

## 2021-05-24 NOTE — Assessment & Plan Note (Signed)
-   continue taking trazodone

## 2021-05-24 NOTE — Assessment & Plan Note (Signed)
-   continue current regimen and follow up with Rheum

## 2021-05-24 NOTE — Assessment & Plan Note (Signed)
-   has significantly improved since fall in Nov 2022

## 2021-05-24 NOTE — Assessment & Plan Note (Signed)
-   continue current regimen  - will check Rheum labs ordered today to also check for h. pylori

## 2021-05-24 NOTE — Assessment & Plan Note (Signed)
Currently well-controlled on:   Oxycodone 10mg . Used to be BID and since last two refills has been tolerating once a day well   Pregabalin 50 mg BID, and baclofen as needed.    - Continue to work closely with patient to wean oxycodone  - UDS up to date in Nov, appropriate.   - Will update contract today.

## 2021-05-25 ENCOUNTER — Telehealth (HOSPITAL_BASED_OUTPATIENT_CLINIC_OR_DEPARTMENT_OTHER): Admitting: Hand Surgery

## 2021-05-25 NOTE — Telephone Encounter (Signed)
Hi,   Patient called she fell, and xrays of her elbow show a small fx. She is in a lot of pain, and would like to know if she could see Dr. [name].     [phone]    Thanks!

## 2021-05-26 ENCOUNTER — Telehealth (HOSPITAL_BASED_OUTPATIENT_CLINIC_OR_DEPARTMENT_OTHER)

## 2021-05-26 LAB — VITAMIN D 25 HYDROXY
Vit D, 25-Hydroxy: 34 ng/mL (ref 20–100)
Vitamin D 25-OH, D2: <2 ng/mL
Vitamin D 25-OH, D3: 34 ng/mL

## 2021-05-27 ENCOUNTER — Ambulatory Visit
Admit: 2021-05-27 | Discharge: 2021-05-27 | Payer: PRIVATE HEALTH INSURANCE | Attending: Hand Surgery | Primary: Student in an Organized Health Care Education/Training Program

## 2021-05-27 ENCOUNTER — Encounter

## 2021-05-27 ENCOUNTER — Other Ambulatory Visit (HOSPITAL_BASED_OUTPATIENT_CLINIC_OR_DEPARTMENT_OTHER): Admitting: Rheumatology

## 2021-05-27 ENCOUNTER — Other Ambulatory Visit

## 2021-05-27 DIAGNOSIS — M25522 Pain in left elbow: Secondary | ICD-10-CM

## 2021-05-27 NOTE — Telephone Encounter (Signed)
Last Visit:05/20/21  Next Visit:  n/a  Diagnosis: RA  Labs: 05/20/21

## 2021-05-27 NOTE — Progress Notes (Addendum)
DEPARTMENT OF ORTHOPAEDIC SURGERY    REASON FOR VISIT: Left Elbow Pain - DOI 04/01/2021    SUBJECTIVE  [name] is a 61 y.o. female who presents today for evaluation of left elbow pain.  Patient reports that on approximately 04/01/2021 she experienced a mechanical fall, landing to the left elbow.  She reports that she developed immediate onset of left elbow pain that was initially improving, though now worsening over the course of the last 3 weeks.  Reports that she has been experiencing pain radiating from the lateral aspect of the elbow to the left forearm into the left upper arm. She denies shoulder pain.  She reports that her pain is worsened with touch and with movement and is improved with Tylenol.  She complains of associated swelling to the elbow and forearm.  She complains of an intermittent burning sensation to the area.  She denies numbness.  She denies fevers or chills.  She denies overlying skin changes.    No other concerns.    Medical History:  Past Medical History:   Diagnosis Date   . Achilles rupture, left, sequela 09/04/2020   . Bilateral carpal tunnel syndrome    . History of venous thromboembolism 2006    Left upper extremity   . IgG4 deficiency (CMS/HCC)    . Liver fibrosis     Stage II   . Paget's disease of bone    . Peptic ulcer disease    . Scoliosis     s/p left L4-L5 transforaminal lumbar interbody fusion   . Spinal stenosis at L4-L5 level     s/p L4-L5 laminectomy wtih complete facetctomy with decompression (02/2020)       Surgical History:  Past Surgical History:   Procedure Laterality Date   . CARPAL TUNNEL RELEASE Left 01/2011   . CHOLECYSTECTOMY  1990   . FOOT SURGERY Left 2008    Plating of left 3rd metatarsal   . HAND SURGERY  2013   . HAND SURGERY  2015   . HARDWARE REMOVAL Right 2006    shoulder due to infection   . HERNIA REPAIR  1990   . LUMBAR FUSION Left 02/2020    L4-L5 transforaminal lumbar interbody fusion   . LUMBAR LAMINECTOMY  02/2020    L4-L5, with complete left  facetectomy and decompression   . ORIF FOOT FRACTURE Left     Left 4th metatarsal   . OSTEOTOMY AND ULNAR SHORTENING Left 1994   . OTHER SURGICAL HISTORY Left 05/2019    Hamstring repair   . REVERSE TOTAL SHOULDER ARTHROPLASTY Left 03/07/2016   . ROTATOR CUFF REPAIR Right 2006   . SHOULDER SURGERY Right 2007    Recurrent infection   . TOTAL KNEE ARTHROPLASTY Bilateral 2009   . TOTAL KNEE ARTHROPLASTY Right 2010    replacement of prior arthroplasty       Current Medications:  Current Outpatient Medications   Medication Sig Dispense Refill   . predniSONE (Deltasone) 10 mg tablet TAKE 2 TABLETS (20 MG) BY MOUTH IN THE MORNING. 60 tablet 2   . ascorbic acid (Vitamin C) 500 mg tablet Take 500 mg by mouth.     . baclofen (Lioresal) 10 mg tablet Take 1 tablet (10 mg) by mouth in the morning and at bedtime. 90 tablet 1   . calcium carbonate-vitamin D3 500 mg-5 mcg (200 unit) tablet Take 1 tablet by mouth in the morning.     . Clinpro 5000 1.1 % paste SMARTSIG:Sparingly To Teeth     .  cyanocobalamin (Vitamin B-12) 1,000 mcg tablet Take 100 mcg by mouth in the morning.     . famotidine (Pepcid) 20 mg tablet Take 20 mg by mouth if needed each day for indigestion.     . hydrocortisone 0.5 % ointment Apply 1 application topically if needed in the morning and at bedtime for rash. 28.4 g 11   . hydroxychloroquine (Plaquenil) 200 mg tablet TAKE 1 TABLET BY MOUTH EVERY DAY WITH FOOD OR MILK 90 tablet 1   . immune globulin, human, (Gammagard) infusion Receives infusion every 4 weeks, next dose 8/5     . Jardiance 10 mg Take 1 tablet (10 mg) by mouth in the morning. 90 tablet 3   . ketoconazole (NIZOral) 2 % cream Apply 1 application topically in the morning. 30 g 1   . lidocaine (Lidoderm) 5 % patch APPLY 1 PATCH TOPICALLY IN THE MORNING. REMOVE & DISCARD PATCH WITHIN 12 HOURS OR AS DIRECTED BY MD. 30 patch 3   . lidocaine (LMX) 4 % cream Apply 1 application topically if needed in the morning, at noon, in the evening, and at bedtime.      [name] lisinopril 2.5 mg tablet Take 1 tablet (2.5 mg) by mouth in the morning. 90 tablet 1   . metFORMIN XR (Glucophage-XR) 500 mg 24 hr tablet Take 4 tablets (2,000 mg) by mouth with breakfast. Do not crush, chew, or split. 360 tablet 3   . mycophenolate (Cellcept) 500 mg tablet TAKE 3 TABLETS IN THE MORNING AND TWO TABLETS AT BEDTIME 450 tablet 1   . omeprazole (PriLOSEC) 40 mg DR capsule Take 1 capsule (40 mg) by mouth before breakfast and before evening meal. 180 capsule 2   . ondansetron (Zofran) 8 mg tablet TAKE 1 TABLET (8 MG) BY MOUTH EVERY 8 HOURS IF NEEDED FOR NAUSEA OR VOMITING FOR UP TO 10 DAYS. 15 tablet 1   . oxyCODONE (Roxicodone) 10 mg immediate release tablet Take 1 tablet (10 mg) by mouth in the morning. 28 tablet 0   . pregabalin (Lyrica) 50 mg capsule TAKE 1 CAPSULE (50 MG) BY MOUTH IN THE MORNING AND AT BEDTIME. 60 capsule 2   . rifAXIMin (Xifaxan) 550 mg tablet Take 1 tablet (550 mg) by mouth in the morning, at noon, and at bedtime for 14 days. 42 tablet 0   . traZODone (Desyrel) 100 mg tablet Take 2 tablets (200 mg) by mouth if needed at bedtime for sleep. 90 tablet 1   . ursodiol (Actigall) 500 mg tablet TAKE 1 TABLET BY MOUTH TWICE A DAY FOR 90 DAYS 180 tablet 2     No current facility-administered medications for this visit.       Allergies:  No Known Allergies    Social History:  Social History     Socioeconomic History   . Marital status: Single     Spouse name: Not on file   . Number of children: Not on file   . Years of education: Not on file   . Highest education level: Not on file   Occupational History   . Not on file   Tobacco Use   . Smoking status: Never   . Smokeless tobacco: Never   Substance and Sexual Activity   . Alcohol use: Yes     Comment: Rare, social drinker   . Drug use: Never   . Sexual activity: Not on file   Other Topics Concern   . 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  Separated from partner of 21 years in June 2021.     Social Determinants of Health     Financial Resource Strain: Not on file   Food Insecurity: Not on file   Transportation Needs: Not on file   Physical Activity: Not on file   Stress: Not on file   Social Connections: Not on file   Intimate Partner Violence: Not on file   Housing Stability: Not on file     Tobacco Use: Low Risk    . Smoking Tobacco Use: Never   . Smokeless Tobacco Use: Never   . Passive Exposure: Not on file     Alcohol Use: Not on file     Social History     Substance and Sexual Activity   Drug Use Never       Family History:  Family History   Problem Relation Name Age of Onset   . Lung cancer Mother     . Diabetes type II Father     . Heart attack Father     . Other (systemic sclerosis with gangrene of fingers) Brother     . Lung cancer Maternal Grandmother     . Brain Aneurysm Maternal Grandfather     . Lung cancer Paternal Grandmother     . Melanoma Mother's Brother         Review of Systems:  10 point review of systems noncontributory except as above in HPI    PHYSICAL EXAM  [name] is a 61 y.o. female in NAD.  Patient is overall pleasant, alert and oriented.      On examination of the left upper extremity  Skin: Clean, dry, and intact. No ecchymosis.  Swelling: Mild edema from the left elbow extending distally to the mid forearm.  Deformity: No deformity  Digital motion: Well maintained  Wrist motion: Well maintained  Elbow motion: ROM elicits pain. 15-110 (lacking approximately 15 degrees of extension and 10 degrees of flexion in comparison to contralateral. Symmetric supination and pronation.  Shoulder motion: Well maintained.   Palpation: Tenderness to palpation of the medial and lateral epicondyle.  No tenderness to palpation of the olecranon.  Mild tenderness to palpation the proximal radius and ulna.  No tenderness to palpation of the distal radius or ulna.  Tenderness to palpation  overlying the area of radial collateral ligament.  Mild radial head laxity, similar in comparison to contralateral.  No anatomical snuff box tenderness.  Tinel's: Negative  Fires deltoid, biceps, triceps, wrist extensors, wrist flexors, EPL, FPL, interossei, grip.   Sensation to light touch is intact in A/M/U/R distributions. Hand is warm and well-perfused.    IMAGING:   Left elbow x-ray 05/20/2021 was reviewed today by [place] and demonstrates small loose body fragment adjacent to the ulnar trochlea.    TREATMENT  Patient was seen and evaluated with Dr. [name] today. Tilla Wilborn is a 61 y.o. female who presents today for evaluation of left elbow pain.    Patient suffered a fall many weeks ago, and since has had left elbow pain that was initially improving, but was again worsening.  Reviewed x-ray findings with the patient today.  Her exam is also concerning for possible radial collateral ligament injury, though this is unclear.  We discussed with the patient that that we would expect both of these  injuries to improve with time and with supportive care which she was instructed on.  Discussed no acute indication for surgical intervention. Patient is in agreement to this.  We instructed the patient to follow-up on an as-needed basis.    The nature and progression of the condition was discussed. All questions answered and they agree to the plan. Jeri Modena MD, performed my own history, physical examination, and review of relevant studies. I reviewed the nature of the condition with the patient, as well as relevant studies, and performed the medical decision making, explaining treatment options to the patient.       Follow up: As needed    Gareth Morgan, PA     This dictation was created through voice recognition. This dictation is proofread, however, grammatical errors and voice recognition errors could still be present. Please contact me about any errors.

## 2021-05-31 ENCOUNTER — Other Ambulatory Visit

## 2021-05-31 ENCOUNTER — Encounter (HOSPITAL_BASED_OUTPATIENT_CLINIC_OR_DEPARTMENT_OTHER)

## 2021-05-31 ENCOUNTER — Encounter (HOSPITAL_BASED_OUTPATIENT_CLINIC_OR_DEPARTMENT_OTHER): Admitting: Rheumatology

## 2021-06-01 ENCOUNTER — Other Ambulatory Visit

## 2021-06-01 ENCOUNTER — Ambulatory Visit
Admit: 2021-06-01 | Discharge: 2021-06-01 | Payer: PRIVATE HEALTH INSURANCE | Attending: Gastroenterology | Primary: Student in an Organized Health Care Education/Training Program

## 2021-06-01 VITALS — BP 118/83 | HR 84 | Ht 62.0 in | Wt 149.4 lb

## 2021-06-01 DIAGNOSIS — R748 Abnormal levels of other serum enzymes: Secondary | ICD-10-CM

## 2021-06-01 NOTE — Progress Notes (Signed)
[facility]  GASTROENTEROLOGY-[place], [place]   Returning patient Visit Note - Adult    MRN: [id number]  Patient Name: [name]    DOB:  1960/08/06  Gender: female   AGE:  61 y.o.  Encounter Date: 06/01/2021     Referring Physician: No ref. provider found   Primary Care Provider: Baldemar Friday, MD  Primary Language: Len[place] Ponds    Chief Complaint/Presenting Diagnosis:  Diarrhea    Interval History:  Ms. Castronovo is a 61 year old female, with PMH of scleroderma (CREST), IgG4 deficiency, RA, chronic steroid [place], history of diverticulosis, gastroparesis, and hx of melanotic stools, who presents today for follow up. She has had longstanding elevations in Alk Phos of unlcear etiology.    She has been previously evaluated for chronic autoimmune hepatitis with long standing elevations in ALK phos levels and positive ANA 1:1280 markers. Additionally, given her multiple autoimmune conditions, there were suspicions for autoimmune hepatitis. Her AMA was previously negative (checked 11/22); liver  biopsy was previously deferred due to knee/back surgery timeline issues. She has been taking mycophenolate 500mg  BID + prednisone low dose for an extended duration. Additionally, she has been was started on ursodiol for her alk phos elevations previously and continues to take her medications without complications.    Since starting ursodiol her LFTS have improved:  05/20/21: AST=25, ALT=25, Alk Phos 85      She had been complaining of chronic diarrhea, for which her rheumatologist started course of rifaximin which she states has improved her diarrhea symptoms tremendously.  She had a colonoscopy 1 year prior for unrelated symptoms that noted diverticulosis, but no biopsies of mucosal wall were collected at that time. Her symptoms self-resolved.   Allergies  No Known Allergies     Medications  Current Outpatient Medications   Medication Instructions   . ascorbic acid (VITAMIN C) 500 mg, oral   . baclofen (LIORESAL) 10 mg, oral, 2 times  daily   . calcium carbonate-vitamin D3 500 mg-5 mcg (200 unit) tablet 1 tablet, oral, Daily   . Clinpro 5000 1.1 % paste SMARTSIG:Sparingly To Teeth   . cyanocobalamin (VITAMIN B-12) 100 mcg, oral, Daily   . famotidine (PEPCID) 20 mg, oral, Daily PRN   . hydrocortisone 0.5 % ointment 1 application, Topical, 2 times daily PRN   . hydroxychloroquine (Plaquenil) 200 mg tablet TAKE 1 TABLET BY MOUTH EVERY DAY WITH FOOD OR MILK   . immune globulin, human, (Gammagard) infusion Receives infusion every 4 weeks, next dose 8/5   . Jardiance 10 mg, oral, Daily   . ketoconazole (NIZOral) 2 % cream 1 application, Topical, Daily   . lidocaine (Lidoderm) 5 % patch 1 patch, topical (top), Daily, Remove & discard patch within 12 hours or as directed by MD.   . lidocaine (LMX) 4 % cream 4 times daily PRN   . lisinopril 2.5 mg, oral, Daily   . metFORMIN XR (GLUCOPHAGE-XR) 2,000 mg, oral, Daily with breakfast, Do not crush, chew, or split.    . mycophenolate (Cellcept) 500 mg tablet TAKE 3 TABLETS IN THE MORNING AND TWO TABLETS AT BEDTIME   . omeprazole (PRILOSEC) 40 mg, oral, 2 times daily before meals   . ondansetron (Zofran) 8 mg tablet TAKE 1 TABLET (8 MG) BY MOUTH EVERY 8 HOURS IF NEEDED FOR NAUSEA OR VOMITING FOR UP TO 10 DAYS.   [name] oxyCODONE (ROXICODONE) 10 mg, oral, Daily   . predniSONE (DELTASONE) 20 mg, oral, Daily   . pregabalin (LYRICA) 50 mg,  oral, 2 times daily   . rifAXIMin (XIFAXAN) 550 mg, oral, 3 times daily   . traZODone (DESYREL) 200 mg, oral, Nightly PRN   . ursodiol (Actigall) 500 mg tablet TAKE 1 TABLET BY MOUTH TWICE A DAY FOR 90 DAYS        Past Medical History  Patient Active Problem List   Diagnosis Code   . 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 Gastroesophageal reflux disease without esophagitis K21.9   . IgG4 deficiency (CMS/HCC) D80.3   . Systemic sclerosis, unspecified (CMS/HCC) M34.9   . 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 Type 2 diabetes mellitus without complication (CMS/HCC) E11.9   . Left hip pain M25.552   . Long-term use of high-risk medication Z79.899   . Abnormal liver function tests R79.89   . 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 Scoliosis M41.9   . Varicose veins of both lower extremities I83.93   . Trigger thumb, right thumb M65.311   . Arthritis of hand, left M19.042   . Diarrhea R19.7   . 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 Urge incontinence N39.41   . Chronic pain syndrome G89.4   . Current chronic use of systemic steroids Z79.52   . Leg swelling M79.89   . Primary insomnia F51.01   . Screening mammogram for breast cancer Z12.31   . 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 Elevated alkaline phosphatase level R74.8   . Immunocompromised state due to drug therapy (CMS/HCC) N02.725, D66.440   . 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 Osteopenia M85.80   . Routine general medical examination at a health care facility Z00.00   . Acute pain of right knee M25.561   . 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 Nausea vomiting and diarrhea R11.2, R19.7   . Pain in left elbow M25.522   . Elevated serum GGT level R74.8   . Gastroparesis K31.84   . Small intestinal bacterial overgrowth (SIBO) K63.89        Past Surgical History  Past Surgical History:   Procedure Laterality Date   . CARPAL TUNNEL RELEASE Left 01/2011   . CHOLECYSTECTOMY  1990   . FOOT SURGERY Left 2008    Plating of left 3rd metatarsal   . HAND SURGERY  2013   . HAND SURGERY  2015   . HARDWARE REMOVAL Right 2006    shoulder due to infection   . HERNIA REPAIR  1990   . LUMBAR FUSION Left 02/2020    L4-L5 transforaminal lumbar interbody fusion   . LUMBAR LAMINECTOMY  02/2020    L4-L5, with complete left facetectomy and decompression   . ORIF FOOT FRACTURE Left     Left 4th metatarsal   . OSTEOTOMY AND ULNAR SHORTENING Left 1994   . OTHER SURGICAL HISTORY Left 05/2019    Hamstring repair   . REVERSE TOTAL SHOULDER  ARTHROPLASTY Left 03/07/2016   . ROTATOR CUFF REPAIR Right 2006   . SHOULDER SURGERY Right 2007    Recurrent infection   . TOTAL KNEE ARTHROPLASTY Bilateral 2009   . TOTAL KNEE ARTHROPLASTY Right 2010    replacement of prior arthroplasty        Family History  Family History   Problem Relation Name Age of Onset   . Lung cancer Mother     . Diabetes type II Father     . Heart attack Father     .  Other (systemic sclerosis with gangrene of fingers) Brother     . Lung cancer Maternal Grandmother     . Brain Aneurysm Maternal Grandfather     . Lung cancer Paternal Grandmother     . Melanoma Mother's Brother          Social History  Social History     Tobacco Use   . Smoking status: Never   . Smokeless tobacco: Never   Substance Use Topics   . Alcohol use: Yes     Comment: Rare, social drinker   . Drug use: Never        Review of Systems  Constitutional: No fevers, chills, sweats  Eye: No recent visual problems  ENMT: No sore throat  Respiratory: No shortness of breath, cough  Cardiovascular: No chest pain, palpitations, syncope  Gastrointestinal: No nausea, vomiting, constipation, +diarrhea, +cramping/discomfort  Integumentary: No rash, pruritus  Neurologic: No headache    Physical Exam  Visit Vitals  BP 118/83 (BP Location: Left arm, Patient Position: Sitting, BP Cuff Size: Adult)   Pulse 84        Body mass index is 27.33 kg/m.    General: Alert and oriented x3, well nourished, no acute distress.  Eye: EOMI, normal conjunctiva.  HEENT: Normocephalic, grossly normal hearing,, no scleral icterus,   Lungs: Clear to auscultation in anterior and posterior fields, non-labored respiration, no use of accessory muscles noted.  Heart: Normal rate, regular rhythm, no murmur or gallop appreciated.  Abdomen: Soft, non-tender to light and deep palpation, non-distended, normal bowel sounds, no masses appreciated, no hepatosplenomegaly palpable.  Musculoskeletal: Normal range of motion , no tenderness or swelling noted.   Skin: Skin  is warm, dry and pink, no rashes or lesions.   Neurologic: CN II-XII grossly intact.  Psychiatric: Cooperative, appropriate mood and affect. Normal speech. Thought process linear, logical, with no delusions/hallucinations.    Lab Tests Reviewed:   Latest Reference Range & Units Most Recent   Albumin 3.2 - 5.0 g/dL 3.9  2/95/28 41:32   Protein, total 6.0 - 8.4 g/dL 7.6  4/40/10 27:25   Aspartate Aminotransferase (AST) 6 - 42 U/L 25  05/20/21 13:13   Alanine Aminotransferase (ALT) 0 - 55 U/L 25  05/20/21 13:13   Bilirubin, direct 0.0 - 0.5 mg/dL 0.4  36/64/40 34:74   Alkaline phosphatase 30 - 130 U/L 85  05/20/21 13:13   Bilirubin, total 0.2 - 1.2 mg/dL 0.4  2/59/56 38:75   GGT 5 - 55 IU/L 268 (H)  10/01/20 12:38   (H): Data is abnormally high    Imaging Reviewed:  [place] ABD 11/09/20  IMPRESSION:   * Dilated common bile duct measuring up to 9 mm, likely the sequela of the postcholecystectomy state, similar in appearance compared to CT abdomen/pelvis dated 08/11/2019.   * Genius echotexture suggesting hepatocellular disease.   * Nonvisualization of the pancreas due to overlying bowel gas.   The image quality was normal.   Maury Dus 11/09/2020 10:52 AM EDT    Endoscopy/Pathology Reviewed:  Colonoscopy 05/31/2019  Procedure:  Colonoscopy  Indications:  Hematochezia  Comorbidities   Providers:  Melany Guernsey, MD  Referring MD:   Requesting Provider:  Medicines: Midazolam 4 mg IV, Fentanyl 100 micrograms IV  Complications: No immediate complications.  _______________________________________________________________________________  Estimated Blood Loss:  Procedure: After I obtained informed consent, the scope was passed   under direct vision. Throughout the procedure, the   patient's blood pressure, pulse, and oxygen saturations   were  monitored continuously. The Colonoscopy was   introduced through the anus and advanced to the cecum,   identified by appendiceal orifice and ileocecal valve. The   colonoscopy was performed  without difficulty. The patient   tolerated the procedure well. The quality of the bowel   preparation was good.    Findings:  Multiple small and large-mouthed diverticula were found in the sigmoid   colon, descending colon and transverse colon.  Internal hemorrhoids were found during retroflexion. The hemorrhoids   were Grade I (internal hemorrhoids that do not prolapse).  The exam was otherwise without abnormality.    Impression: - Diverticulosis in the sigmoid colon, in the descending   colon and in the transverse colon.  - Internal hemorrhoids.  - The examination was otherwise normal.  - No specimens collected.    Recommendation: Advance Diet      ________________________  Melany GuernseyNaveen Pandaraboyina, MD  05/31/2019 10:29:07 AM  Number of Addenda: 0    Note Initiated On: 05/31/2019 10:01 AM    Assessment & Plan:  1. Elevated serum GGT level    2. Systemic sclerosis, unspecified (CMS/HCC)    3. CREST syndrome (CMS/HCC)    4. Scleroderma progressive (CMS/HCC)    5. Seronegative rheumatoid arthritis (CMS/HCC)    6. 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 Steroid-induced osteoporosis    8. Type 2 diabetes mellitus without complication, without long-term current use of insulin (CMS/HCC)    9. Gastroparesis    10. Small intestinal bacterial overgrowth (SIBO)         61 y.o. female with PMH of scleroderma (CREST), IgG4 deficiency, RA, chronic steroid us, history of diverticulosis, gastroparesis, and hx of melanotic stools, who is presenting for follow up of her chronic LFT elevations. She has elevated alk phos of unclear etiology,  However this has responded to ursodiol  She also was complaining of chronic intermittent diarrhea, for which she was started on a trial of rifaximin, for which she reports improved results. I do suspect she likely has a component of SIBO, however no formal diagnosis was made, but did respond to rifaximin therapy    Last AMA checked was in 02/2021 was was negative. She is on Urso  as well as immunosuppressant Cellcept and plaquenil       Plan:  #Elevated LFTS: improved on ursodiol: continue current dose 500 mg twice a day (AMA negative PBC may still be in the differential,on appropriate therapy with appropriate response) : continue to monitor LFTS periodically, remains on immunosuppressive,   # Diarrhea: suspect SIBO rifaximin prescribed by Dr Lorella NimrodHarvey; , last colonoscopy 2021 (no random biopsies)  significantly improved after course of rifaximin, will continued to monitor for repeat treatment; will defer colonoscopy at this time    Follow-up in 1 year with me, however will see Hepatology NP Carolina CellarMiu Ng later this year.  Labs (LFTS, GGT) ordered for next blood draw prior to hepatology visit.      Alinda Moneyaffi Kerrianne Jeng, MD  [facility]    Orders Placed This Encounter   Procedures   . Hepatic function panel   . Gamma GT

## 2021-06-01 NOTE — Patient Instructions (Addendum)
Follow-up in 1 year  Add H-2 blocker evening as needed for acid suppression

## 2021-06-03 ENCOUNTER — Encounter (HOSPITAL_BASED_OUTPATIENT_CLINIC_OR_DEPARTMENT_OTHER): Admitting: Rheumatology

## 2021-06-17 ENCOUNTER — Other Ambulatory Visit (HOSPITAL_BASED_OUTPATIENT_CLINIC_OR_DEPARTMENT_OTHER): Admitting: Student in an Organized Health Care Education/Training Program

## 2021-06-17 ENCOUNTER — Encounter: Payer: PRIVATE HEALTH INSURANCE | Primary: Student in an Organized Health Care Education/Training Program

## 2021-06-17 MED ORDER — oxyCODONE (Roxicodone) 10 mg immediate release tablet
10 | ORAL_TABLET | Freq: Every day | ORAL | 0 refills | 8.00000 days | Status: DC
Start: 2021-06-17 — End: 2021-07-27

## 2021-06-17 MED ORDER — traZODone (Desyrel) 100 mg tablet
100 | ORAL_TABLET | Freq: Every evening | ORAL | 1 refills | Status: DC | PRN
Start: 2021-06-17 — End: 2021-09-13

## 2021-06-17 MED ORDER — baclofen (Lioresal) 10 mg tablet
10 | ORAL_TABLET | Freq: Two times a day (BID) | ORAL | 1 refills | Status: DC
Start: 2021-06-17 — End: 2021-08-11

## 2021-06-17 NOTE — Telephone Encounter (Signed)
Long term management, tolerated well, refill submitted, but pt may need an appt for next refill.    Derinda LateZi Charly Hunton, RN  06/17/2021  1:12 PM

## 2021-06-17 NOTE — Telephone Encounter (Signed)
Patient called to request a refill of     Baclofen 10 mg   Oxycodone 10 mg   Famotidine 20 mg   Trazodone 100 mg   Pt also requested Glipizide       [facility]     Patient  last office visit was: 05/20/2021    Script prescribed by: multiple providers       Informed patient it can take 3 business days to send prescription refill to pharmacy.    Verified insurance with patient/in Epic (select one) [employer]    Call back number: [phone]

## 2021-06-17 NOTE — Telephone Encounter (Signed)
PDMP reviewed  Last filled oxycodone 28 day supply on 2/7, due for refill      Last appt: 05/20/21  Next appt: none in place  Last UDS: 11.23.22  Last contract: 05/20/21    Pended for pcp preceptor to sign   Clifton James, PA

## 2021-06-20 ENCOUNTER — Encounter: Payer: PRIVATE HEALTH INSURANCE | Primary: Student in an Organized Health Care Education/Training Program

## 2021-06-20 ENCOUNTER — Telehealth (HOSPITAL_BASED_OUTPATIENT_CLINIC_OR_DEPARTMENT_OTHER)

## 2021-06-20 ENCOUNTER — Other Ambulatory Visit

## 2021-06-20 ENCOUNTER — Ambulatory Visit: Admitting: Registered Nurse

## 2021-06-20 ENCOUNTER — Ambulatory Visit: Attending: Registered Nurse | Admitting: Registered Nurse

## 2021-06-20 ENCOUNTER — Ambulatory Visit
Admit: 2021-06-20 | Discharge: 2021-06-20 | Payer: PRIVATE HEALTH INSURANCE | Primary: Student in an Organized Health Care Education/Training Program

## 2021-06-20 ENCOUNTER — Telehealth (HOSPITAL_BASED_OUTPATIENT_CLINIC_OR_DEPARTMENT_OTHER): Admitting: Rheumatology

## 2021-06-20 DIAGNOSIS — M340 Progressive systemic sclerosis: Secondary | ICD-10-CM

## 2021-06-20 DIAGNOSIS — K219 Gastro-esophageal reflux disease without esophagitis: Secondary | ICD-10-CM

## 2021-06-20 LAB — COMPREHENSIVE METABOLIC PANEL
ALT: 29 U/L (ref 0–55)
AST: 27 U/L (ref 6–42)
Albumin: 3.9 g/dL (ref 3.2–5.0)
Alkaline phosphatase: 121 U/L (ref 30–130)
Anion Gap: 11 mmol/L (ref 3–14)
BUN: 26 mg/dL — ABNORMAL HIGH (ref 6–24)
Bilirubin, total: 0.4 mg/dL (ref 0.2–1.2)
CO2 (Bicarbonate): 24 mmol/L (ref 20–32)
Calcium: 9.1 mg/dL (ref 8.5–10.5)
Chloride: 103 mmol/L (ref 98–110)
Creatinine: 0.73 mg/dL (ref 0.55–1.30)
Glucose: 179 mg/dL — ABNORMAL HIGH (ref 70–99)
Potassium: 3.7 mmol/L (ref 3.6–5.2)
Protein, total: 6.6 g/dL (ref 6.0–8.4)
Sodium: 138 mmol/L (ref 135–146)
eGFRcr: 95 mL/min/{1.73_m2} (ref >=60)

## 2021-06-20 LAB — CBC
Hematocrit: 40.3 % (ref 32.0–47.0)
Hemoglobin: 13 g/dL (ref 11.0–16.0)
MCH: 30.6 pg (ref 26.0–34.0)
MCHC: 32.3 g/dL (ref 31.0–37.0)
MCV: 94.8 fL (ref 80.0–100.0)
MPV: 9.3 fL (ref 9.1–12.4)
NRBC %: 0 % (ref 0.0–0.0)
NRBC Absolute: 0 10*3/uL (ref 0.00–2.00)
Platelets: 236 10*3/uL (ref 150–400)
RBC: 4.25 M/uL (ref 3.70–5.20)
RDW-CV: 14.5 % (ref 11.5–14.5)
RDW-SD: 50.4 fL (ref 35.0–51.0)
WBC: 9 10*3/uL (ref 4.0–11.0)

## 2021-06-20 LAB — PTH, INTACT: PTH: 33.1 pg/mL (ref 15.0–65.0)

## 2021-06-20 LAB — GGT: GGT: 124 IU/L — ABNORMAL HIGH (ref 5–55)

## 2021-06-20 MED ORDER — famotidine (Pepcid) 20 mg tablet
20 | ORAL_TABLET | Freq: Every day | ORAL | 1 refills | Status: DC | PRN
Start: 2021-06-20 — End: 2021-07-29

## 2021-06-20 MED ORDER — immune globulin (human) (Gammagard) infusion 30 g
10 | Freq: Once | INTRAMUSCULAR | Status: AC
Start: 2021-06-20 — End: 2021-06-20
  Administered 2021-06-20: 13:00:00 30 g via INTRAVENOUS

## 2021-06-20 MED ORDER — sodium chloride 0.9 % flush 10 mL
Freq: Once | INTRAMUSCULAR | Status: DC | PRN
Start: 2021-06-20 — End: 2021-06-20
  Administered 2021-06-20: 15:00:00 10 mL via INTRAVENOUS

## 2021-06-20 MED ORDER — sodium chloride 0.9 % flush 10 mL
Freq: Once | INTRAMUSCULAR | Status: AC
Start: 2021-06-20 — End: 2021-06-20
  Administered 2021-06-20: 13:00:00 10 mL via INTRAVENOUS

## 2021-06-20 MED FILL — IMMUNE GLOB,GAMMA (IGG) 10 %-GLY-IGA OVER 50 MCG/ML INJECTION SOLUTION: 10 10 % | INTRAMUSCULAR | Qty: 300

## 2021-06-20 NOTE — Telephone Encounter (Signed)
Pt stopped by the clinic asking for a refill on:   Famotidine 20 mg    Pharmacy on file.

## 2021-06-20 NOTE — Telephone Encounter (Signed)
Hello,  Patient called to request a refill of:     Glipiside 5mg -3 mo supply     Pharmacy, pharmacy address, and [facility] number[facility] [place]        Informed patient it can take 2 business days to send prescription refill to pharmacy.       Call back number: [phone]    Thank you!

## 2021-06-20 NOTE — Progress Notes (Signed)
IVIG q28 days  Presents in usual state of health, with no new issues or concerns.  Started  @ 0.02, inc to 0.04, 0.08 will vss no reaction. Tolerated well.  Will rtc tomorrow for Reclast. Opted to remove iv.

## 2021-06-21 ENCOUNTER — Ambulatory Visit
Admit: 2021-06-21 | Payer: PRIVATE HEALTH INSURANCE | Primary: Student in an Organized Health Care Education/Training Program

## 2021-06-21 ENCOUNTER — Telehealth (HOSPITAL_BASED_OUTPATIENT_CLINIC_OR_DEPARTMENT_OTHER)

## 2021-06-21 ENCOUNTER — Other Ambulatory Visit (HOSPITAL_BASED_OUTPATIENT_CLINIC_OR_DEPARTMENT_OTHER): Admitting: Student in an Organized Health Care Education/Training Program

## 2021-06-21 ENCOUNTER — Encounter (HOSPITAL_BASED_OUTPATIENT_CLINIC_OR_DEPARTMENT_OTHER)

## 2021-06-21 DIAGNOSIS — M340 Progressive systemic sclerosis: Secondary | ICD-10-CM

## 2021-06-21 MED ORDER — sodium chloride 0.9 % flush 10 mL
Freq: Once | INTRAMUSCULAR | Status: DC | PRN
Start: 2021-06-21 — End: 2021-06-21

## 2021-06-21 MED ORDER — ondansetron (Zofran) 8 mg tablet
8 | ORAL_TABLET | Freq: Three times a day (TID) | ORAL | 1 refills | Status: AC | PRN
Start: 2021-06-21 — End: 2021-07-01

## 2021-06-21 MED ORDER — zoledronic acid (Reclast) IVPB 5 mg
5 | Freq: Once | INTRAVENOUS | Status: AC
Start: 2021-06-21 — End: 2021-06-21
  Administered 2021-06-21: 13:00:00 5 mg via INTRAVENOUS

## 2021-06-21 MED ORDER — sodium chloride 0.9 % flush 10 mL
Freq: Once | INTRAMUSCULAR | Status: AC
Start: 2021-06-21 — End: 2021-06-21
  Administered 2021-06-21: 13:00:00 10 mL via INTRAVENOUS

## 2021-06-21 MED FILL — ZOLEDRONIC ACID 5 MG/100 ML IN MANNITOL 5 %-WATER INTRAVENOUS PIGGYBCK: 5 5 mg/100 mL | INTRAVENOUS | Qty: 100

## 2021-06-21 NOTE — Progress Notes (Signed)
Zoledronic acid (reclast) for osteoporosis, new treatment  Presents feeling well. Tolerated ivig yesterday, no new issues or concerns. Did receive some education regarding reclast, side effects and mode of action reviewed.  Written information sent to patient portal. Tolerated without difficulty. Post care teaching reinforced for fluids, tylenol prn. Discharged condition stable

## 2021-06-21 NOTE — Telephone Encounter (Signed)
Hello,  Patient called to request a refill of:  Glipizide .  Pt has no more refill.  Pt states that the Glipizide was stopped taken for awhile. After her most recent visit with Dr Regino SchultzeWang, pt was advised to take the medication again.      Pharmacy, pharmacy address, and phone number:  [facility] [place] [place] in Epic     Patient last office visit was: 05/20/21     Script prescribed by/last provider seen: Dr Regino SchultzeWang        Informed patient it can take 2 business days to send prescription refill to pharmacy.     Verified insurance with patient OR in Epic (select one)     Call back number:  [phone].    Thank you!

## 2021-06-22 MED ORDER — glipiZIDE XL (Glucotrol XL) 2.5 mg 24 hr tablet
2.5 | ORAL_TABLET | Freq: Every day | ORAL | 3 refills | 90.00000 days | Status: DC
Start: 2021-06-22 — End: 2021-06-22

## 2021-06-22 NOTE — Telephone Encounter (Signed)
Phoned patient to clarify medication dosage    - Feb 2023 visit: instructed to stop glipizide as she was having some lows (associated with not eating), and A1c was 6.1% so had room. She does remember this discussion now in retrospect.   - In the last month, she continued taking glipizide 2.5mg  XL daily. BGs mornig and afternoon have been in the 80s-110s range, with still some lows (60s) if not eating.   - Since running out of glipizide a few days ago, morning BG 110s, afternoon up to 140s-150s. None >180.     Advised that these BG are still in target, and given that she continued to have lows on low-dose glipizide, recommend stopping glipizide and observing her blood sugars. She also said that when she was first diagnosed and was on a higher dose of glipizide, she passed out while driving, so is aware of the risks of the medication.     Plan:   - Stop glipizide  - Continue monitoring BG BID - 80-130 in morning, <180 random  - If several BG > 180, can discuss increasing jardiance vs. Restarting glipizide.     Ephriam Jenkins, MD

## 2021-07-11 NOTE — Telephone Encounter (Signed)
 pt would like a cb from the doctor about scheduling of reclast infusion bc [phone]

## 2021-07-11 NOTE — Telephone Encounter (Signed)
 Hello,  Patient requesting call back from: clinic  In regards to: pt needs to reschedule reclast on 07/13/21 because she will not be in . Pt also has questions about her reclast and infusion appts in hem/onc and wants more info on all of these appts.   Call back number is:[phone]. Thank you  Was the office contacted?yes    Relayed to patient the team will get back to them as soon as they are available.    Thank you!

## 2021-07-11 NOTE — Telephone Encounter (Signed)
 PT called to inform that she will not be needing the 4/12 Reclast appt. PT states that she already had the Reclast injection on 3/21st and that it is in the nurses notes.   CB# [phone]    Sending as Lorain Childes    Thank you!

## 2021-07-12 NOTE — Telephone Encounter (Signed)
Confirmed that patient received reclast dose via infusion clinic, so no longer needs the infusion appt in endo clinic.

## 2021-07-13 ENCOUNTER — Ambulatory Visit: Payer: PRIVATE HEALTH INSURANCE | Primary: Student in an Organized Health Care Education/Training Program

## 2021-07-15 ENCOUNTER — Encounter: Payer: PRIVATE HEALTH INSURANCE | Primary: Student in an Organized Health Care Education/Training Program

## 2021-07-19 ENCOUNTER — Encounter: Payer: PRIVATE HEALTH INSURANCE | Primary: Student in an Organized Health Care Education/Training Program

## 2021-07-25 NOTE — Telephone Encounter (Signed)
Patient is having issues with body pain, stomache issues and swelling all over.     She sated is hard to function and these issues have been happening for about a month.    Patient would like a clinic call back.    Patient's call back # [phone].    Thank you

## 2021-07-25 NOTE — Telephone Encounter (Addendum)
Called patient to follow up; patient is currently having a Rheumatoid Arthritis flare.    Location of joint pain: Body pain,  every joint hurts including right hip which she has not had issues with in the past. Also endorses  acid which is causing nausea twice a week. Symptoms have been for  about a month. Does not take any blood thinners but noticed that when she nicks herself shaving she bleeds for 3 days.     When did the current flare start: about a month ago  What are the typical joints that are affected in your RA flare: hands, wrists, back, leg, ankles, shoulders  Do the affected joints now get stiff in the morning: yes  How long does the stiffness last and I it better with movement: 4-6 hours to be able to dress, put shoes on  Any redness or warmth: yes, on knees, shoulders  Swelling: hands and wrists    Is this affecting your ADLs: yes  What therapies have you tried (NSAIDS, Tramadol, opiates, Dmards, Biologics, Methotrexate, SSZ, LEF): CellCept 2 in am and 1 at night,  prednisone 40mg  in am and 40mg  at night,  hydroxychloroquine, omeprazle, and famotidine. Holds HCQ if she takes nausea med. Also takes tylenol which is helpful.   Any PT, OT or Steroid injection: yes, all of these    When was your last appointment with [place] and when is your next: last visit 05/20/21, f/u not scheduled

## 2021-07-26 NOTE — Telephone Encounter (Signed)
4/28 at 12PM arrival. All set.

## 2021-07-27 NOTE — Telephone Encounter (Signed)
PMP reviewed, no other prescribers or unexpected opioid prescriptions  28/28 day supply  Last filled/due:  06/17/21  Next due on/after:  Due for refill    Last appt:   05/20/21  Next appt:  F/u will be scheduled for later this year  Last UDS: 02/23/21, was ordered on 05/18/21 but never done  Last contract:   05/20/21    Refill pended for Attending PCP

## 2021-07-28 MED ORDER — oxyCODONE (Roxicodone) 10 mg immediate release tablet
10 | ORAL_TABLET | Freq: Every day | ORAL | 0 refills | 8.00000 days | Status: DC
Start: 2021-07-28 — End: 2021-09-09

## 2021-07-29 ENCOUNTER — Ambulatory Visit
Admit: 2021-07-29 | Discharge: 2021-07-29 | Payer: PRIVATE HEALTH INSURANCE | Attending: Rheumatology | Primary: Student in an Organized Health Care Education/Training Program

## 2021-07-29 ENCOUNTER — Other Ambulatory Visit
Admit: 2021-07-29 | Payer: PRIVATE HEALTH INSURANCE | Primary: Student in an Organized Health Care Education/Training Program

## 2021-07-29 ENCOUNTER — Ambulatory Visit
Admit: 2021-07-29 | Discharge: 2021-07-29 | Payer: PRIVATE HEALTH INSURANCE | Primary: Student in an Organized Health Care Education/Training Program

## 2021-07-29 DIAGNOSIS — M0600 Rheumatoid arthritis without rheumatoid factor, unspecified site: Secondary | ICD-10-CM

## 2021-07-29 DIAGNOSIS — M340 Progressive systemic sclerosis: Secondary | ICD-10-CM

## 2021-07-29 LAB — COMPREHENSIVE METABOLIC PANEL
ALT: 24 U/L (ref 0–55)
AST: 23 U/L (ref 6–42)
Albumin: 4.2 g/dL (ref 3.2–5.0)
Alkaline phosphatase: 95 U/L (ref 30–130)
Anion Gap: 10 mmol/L (ref 3–14)
BUN: 17 mg/dL (ref 6–24)
Bilirubin, total: 0.5 mg/dL (ref 0.2–1.2)
CO2 (Bicarbonate): 21 mmol/L (ref 20–32)
Calcium: 9 mg/dL (ref 8.5–10.5)
Chloride: 104 mmol/L (ref 98–110)
Creatinine: 0.51 mg/dL — ABNORMAL LOW (ref 0.55–1.30)
Glucose: 77 mg/dL (ref 70–139)
Potassium: 4.2 mmol/L (ref 3.6–5.2)
Protein, total: 8.1 g/dL (ref 6.0–8.4)
Sodium: 135 mmol/L (ref 135–146)
eGFRcr: 108 mL/min/{1.73_m2} (ref >=60)

## 2021-07-29 LAB — CBC WITH DIFFERENTIAL
Basophils %: 0.2 %
Basophils Absolute: 0.03 10*3/uL (ref 0.00–0.22)
Eosinophils %: 0 %
Eosinophils Absolute: 0 10*3/uL (ref 0.00–0.50)
Hematocrit: 41.6 % (ref 32.0–47.0)
Hemoglobin: 13.1 g/dL (ref 11.0–16.0)
Immature Granulocytes %: 0.7 %
Immature Granulocytes Absolute: 0.1 10*3/uL (ref 0.00–0.10)
Lymphocyte %: 3.7 %
Lymphocytes Absolute: 0.51 10*3/uL — ABNORMAL LOW (ref 0.70–4.00)
MCH: 29.8 pg (ref 26.0–34.0)
MCHC: 31.5 g/dL (ref 31.0–37.0)
MCV: 94.8 fL (ref 80.0–100.0)
MPV: 9.4 fL (ref 9.1–12.4)
Monocytes %: 6.3 %
Monocytes Absolute: 0.88 10*3/uL — ABNORMAL HIGH (ref 0.36–0.77)
NRBC %: 0 % (ref 0.0–0.0)
NRBC Absolute: 0 10*3/uL (ref 0.00–2.00)
Neutrophil %: 89.1 %
Neutrophils Absolute: 12.39 10*3/uL — ABNORMAL HIGH (ref 1.50–7.95)
Platelets: 266 10*3/uL (ref 150–400)
RBC: 4.39 M/uL (ref 3.70–5.20)
RDW-CV: 14.4 % (ref 11.5–14.5)
RDW-SD: 50.4 fL (ref 35.0–51.0)
WBC: 13.9 10*3/uL — ABNORMAL HIGH (ref 4.0–11.0)

## 2021-07-29 LAB — C-REACTIVE PROTEIN: CRP: 1.34 mg/L (ref 0.00–4.99)

## 2021-07-29 LAB — SEDIMENTATION RATE, AUTOMATED: Sed Rate: 52 mm/h — ABNORMAL HIGH (ref 0–30)

## 2021-07-29 MED ORDER — immune globulin (human) (Gammagard) infusion 30 g
10 | Freq: Once | INTRAMUSCULAR | Status: AC
Start: 2021-07-29 — End: 2021-07-29
  Administered 2021-07-29: 14:00:00 30 g via INTRAVENOUS

## 2021-07-29 MED ORDER — sodium chloride 0.9 % flush 10 mL
Freq: Once | INTRAMUSCULAR | Status: AC
Start: 2021-07-29 — End: 2021-07-29
  Administered 2021-07-29: 14:00:00 10 mL via INTRAVENOUS

## 2021-07-29 MED ORDER — sodium chloride 0.9 % flush 10 mL
Freq: Once | INTRAMUSCULAR | Status: DC | PRN
Start: 2021-07-29 — End: 2021-07-29

## 2021-07-29 MED FILL — IMMUNE GLOB,GAMMA (IGG) 10 %-GLY-IGA OVER 50 MCG/ML INJECTION SOLUTION: 10 10 % | INTRAMUSCULAR | Qty: 300

## 2021-07-29 NOTE — Assessment & Plan Note (Addendum)
Patient with increasing pain and swelling of her joints. Overall having aches and pains that have been progressive over past couple months that have affected her walking, ability to hold items, daily activities etc. She has tried many different agents without good efficacy. After much discussion, will try restarting orencia again and decrease MMF concurrently. Other option was starting cyclophosphomide, however patient wanted to try the combination of orencia and MMF first as cyclophosphomide is much more immunosuppressive agent. Hopefully the combination of decreasing MMF and routine IVIG infusions will prevent increasing risk of infection.   - start orencia SQ. Obtain basic labs and TB testing prior to initiation  - decrease MMF to 500mg  BID   - follow up in 1-3 months

## 2021-07-29 NOTE — Addendum Note (Signed)
Addended by: Glenice Laine on: 07/29/2021 03:11 PM     Modules accepted: Orders

## 2021-07-29 NOTE — Progress Notes (Signed)
Pt presented to the IC for her monthly IVIG - tolerated infusion without any issues. Infusion given per protocol.  No new issues or concerns  Checked out and left IC in stable condition. Pt went straight to her appointment in Rheumatology

## 2021-07-29 NOTE — Assessment & Plan Note (Signed)
Hasn't had significant progression of skin manifestations. Predominantly having progressive joint swelling and pain.

## 2021-07-29 NOTE — Progress Notes (Signed)
 [facility]HE[facility]  [facility]umat[facility]  [address]  [place] [address]  [place] [address]  Dept: [phone]  Dept Fax: [phone]     Patient ID: [name] is a 61 y.o. female with history of scleroderma (CREST), seronegative RA, IgG4 deficiency, chronic steroid use, presumed SIBO, hx of diverticulosis, gastroparesis who presents for follow up.     Subjective     Last seen in January in Rheumatology clinic where cellcept taper was initiated. It was thought that pain of joints of hand were secondary to OA instead of a new RA flare. Xray of left elbow was done due to pain and small ?fracture seen. She saw orthopedics in 05/2021 who said there was possible radial collateral ligament injury, although unclear and that this injury should improve with supportive care. No surgical intervention was indicated.     Saw GI in 06/2021 due to elevated alk phos of unclear etiology. She had work up for chronic autoimmune hepatitis, however AMA negative and liver biopsy was unable to be done. Ursodiol was started and helped with alk phos elevation. AMA negative PBC remained on the differential. She was continued on ursodiol 500mg  BID and will follow up with hepatology.     Since then patient says over the past month has been having aches/pains throughout whole body. Left heel has been bothering her tremendously. Taking zofran every 6 days due to nausea. PPI is helping with reflux and symptoms have improved after rifaximin course. No diffuse pruritus. Right 4th finger hurts like it's bruised. No trauma to finger or heel.     Walking is difficult without crutches. Unable to carry things in arm and progressively getting worse. Takes 4-6 hours to get ready in the morning and feels ready to move.         Patient Active Problem List   Diagnosis   . CVID (common variable immunodeficiency) (CMS/HCC)   . Gastroesophageal reflux disease without esophagitis   . IgG4 deficiency (CMS/HCC)    . Systemic sclerosis, unspecified (CMS/HCC)   . Seronegative rheumatoid arthritis (CMS/HCC)   . Spinal stenosis, lumbar region, with neurogenic claudication   . Type 2 diabetes mellitus without complication (CMS/HCC)   . Left hip pain   . Long-term use of high-risk medication   . Abnormal liver function tests   . Hypogammaglobulinemia (CMS/HCC)   . Scoliosis   . Varicose veins of both lower extremities   . Trigger thumb, right thumb   . Arthritis of hand, left   . Diarrhea   . Scleroderma progressive (CMS/HCC)   . Urge incontinence   . Chronic pain syndrome   . Current chronic use of systemic steroids   . Leg swelling   . Primary insomnia   . Screening mammogram for breast cancer   . CREST syndrome (CMS/HCC)   . Elevated alkaline phosphatase level   . Immunocompromised state due to drug therapy (CMS/HCC)   . Paget's disease of bony pelvis   . Osteopenia   . Routine general medical examination at a health care facility   . Acute pain of right knee   . Steroid-induced osteoporosis   . Nausea vomiting and diarrhea   . Pain in left elbow   . Elevated serum GGT level   . Gastroparesis   . Small intestinal bacterial overgrowth (SIBO)     Current Outpatient Medications   Medication Instructions   . ascorbic acid (VITAMIN C) 500 mg, oral   . baclofen (LIORESAL)  10 mg, oral, 2 times daily   . calcium carbonate-vitamin D3 500 mg-5 mcg (200 unit) tablet 1 tablet, oral, Daily   . Clinpro 5000 1.1 % paste SMARTSIG:Sparingly To Teeth   . cyanocobalamin (VITAMIN B-12) 100 mcg, oral, Daily   . hydrocortisone 0.5 % ointment 1 application., Topical, 2 times daily PRN   . hydroxychloroquine (Plaquenil) 200 mg tablet TAKE 1 TABLET BY MOUTH EVERY DAY WITH FOOD OR MILK   . immune globulin, human, (Gammagard) infusion Receives infusion every 4 weeks, next dose 8/5   . Jardiance 10 mg, oral, Daily   . ketoconazole (NIZOral) 2 % cream 1 application., Topical, Daily   . lidocaine (Lidoderm) 5 % patch 1 patch, topical (top), Daily, Remove &  discard patch within 12 hours or as directed by MD.   . lidocaine (LMX) 4 % cream 1 application., 4 times daily PRN   . lisinopril 2.5 mg tablet TAKE 1 TABLET BY MOUTH IN THE MORNING   . metFORMIN XR (GLUCOPHAGE-XR) 2,000 mg, oral, Daily with breakfast, Do not crush, chew, or split.    . mycophenolate (Cellcept) 500 mg tablet TAKE 3 TABLETS IN THE MORNING AND TWO TABLETS AT BEDTIME   . omeprazole (PRILOSEC) 40 mg, oral, 2 times daily before meals   . oxyCODONE (ROXICODONE) 10 mg, oral, Daily   . predniSONE (DELTASONE) 20 mg, oral, Daily   . pregabalin (LYRICA) 50 mg, oral, 2 times daily   . traZODone (DESYREL) 200 mg, oral, Nightly PRN   . ursodiol (Actigall) 500 mg tablet TAKE 1 TABLET BY MOUTH TWICE A DAY FOR 90 DAYS     No Known Allergies  Past Medical History:   Diagnosis Date   . Achilles rupture, left, sequela 09/04/2020   . Bilateral carpal tunnel syndrome    . History of venous thromboembolism 2006    Left upper extremity   . IgG4 deficiency (CMS/HCC)    . Liver fibrosis     Stage II   . Paget's disease of bone    . Peptic ulcer disease    . Scoliosis     s/p left L4-L5 transforaminal lumbar interbody fusion   . Spinal stenosis at L4-L5 level     s/p L4-L5 laminectomy wtih complete facetctomy with decompression (02/2020)     Social History     Tobacco Use   . Smoking status: Never   . Smokeless tobacco: Never   Vaping Use   . Vaping status: None   Substance Use Topics   . Alcohol use: Yes     Comment: Rare, social drinker   . Drug use: Never       Objective   Visit Vitals  BP 120/75   Pulse 80   Ht 1.57 m   Wt 69 kg   SpO2 96%   BMI 28.01 kg/m   BSA 1.73 m       Physical Exam  Constitutional:       General: She is not in acute distress.     Appearance: She is not ill-appearing or toxic-appearing.   Eyes:      General: No scleral icterus.     Extraocular Movements: Extraocular movements intact.      Conjunctiva/sclera: Conjunctivae normal.      Pupils: Pupils are equal, round, and reactive to light.    Cardiovascular:      Rate and Rhythm: Normal rate and regular rhythm.      Heart sounds: Normal heart sounds. No murmur heard.  No friction rub. No gallop.   Pulmonary:      Effort: Pulmonary effort is normal. No respiratory distress.      Breath sounds: Normal breath sounds. No stridor. No wheezing or rales.   Musculoskeletal:      Comments: Limited ROM of bilateral hands. Right 4th finger with tenderness to palpation but no overlying lesions. All joints swollen.    Bilateral shoulders with passive ROM to 90 degrees. No tenderness to palpation of shoulders.     No tenderness to palpation of bilateral knees with overlying vertical scars. Full ROM.     Tenderness to palpation of calcaneus of left heel (side without achilles tendon). No swelling of left ankle joint.     Neurological:      Mental Status: She is alert.         Assessment/Plan   Seronegative rheumatoid arthritis (CMS/HCC)  Assessment & Plan:  Patient with increasing pain and swelling of her joints. Overall having aches and pains that have been progressive over past couple months that have affected her walking, ability to hold items, daily activities etc. She has tried many different agents without good efficacy. After much discussion, will try restarting orencia again and decrease MMF concurrently. Other option was starting cyclophosphomide, however patient wanted to try the combination of orencia and MMF first as cyclophosphomide is much more immunosuppressive agent. Hopefully the combination of decreasing MMF and routine IVIG infusions will prevent increasing risk of infection.   - start orencia SQ. Obtain basic labs and TB testing prior to initiation  - decrease MMF to 500mg  BID   - follow up in 1-3 months   Orders:  -     C-reactive protein; Future  -     Sedimentation rate, automated; Future  -     Comprehensive metabolic panel; Future  -     CBC and differential; Future  -     Quantiferon TB Gold; Future  Scleroderma progressive  (CMS/HCC)  Assessment & Plan:  Hasn't had significant progression of skin manifestations. Predominantly having progressive joint swelling and pain.

## 2021-07-31 NOTE — Assessment & Plan Note (Signed)
Persistent IgG deficiency on immunosuppressive treatment.  She has not had any severe infections on IVIG, continue monthly prophylactic infusions.

## 2021-08-03 LAB — QUANTIFERON TB GOLD, 1T
Mitogen-NIL: 1.8 [IU]/mL
NIL: 0.04 [IU]/mL
Quantiferon - TB Gold Test: NEGATIVE
TB1-NIL: 0 [IU]/mL
TB2-NIL: <0 [IU]/mL

## 2021-08-03 MED ORDER — polyethylene glycol (Colyte) 240-22.72-6.72 -5.84 gram solution
240-22.72-6.72 | Freq: Once | ORAL | 0 refills | 28.00000 days | Status: AC
Start: 2021-08-03 — End: 2021-08-03

## 2021-08-15 ENCOUNTER — Encounter: Payer: PRIVATE HEALTH INSURANCE | Primary: Student in an Organized Health Care Education/Training Program

## 2021-08-16 ENCOUNTER — Ambulatory Visit
Payer: PRIVATE HEALTH INSURANCE | Attending: Gastroenterology | Primary: Student in an Organized Health Care Education/Training Program

## 2021-08-16 ENCOUNTER — Encounter

## 2021-08-25 NOTE — Telephone Encounter (Signed)
Last visit:  07/29/21  F/u:  Not scheduled yet  Last labs:  07/29/21  Dx:  Systemic sclerosis, unspecified

## 2021-08-26 ENCOUNTER — Ambulatory Visit: Admit: 2021-08-26 | Discharge: 2021-08-26 | Payer: PRIVATE HEALTH INSURANCE

## 2021-08-26 DIAGNOSIS — E119 Type 2 diabetes mellitus without complications: Secondary | ICD-10-CM

## 2021-08-26 DIAGNOSIS — M340 Progressive systemic sclerosis: Secondary | ICD-10-CM

## 2021-08-26 MED ORDER — sodium chloride 0.9 % flush 10 mL
Freq: Once | INTRAMUSCULAR | Status: DC | PRN
Start: 2021-08-26 — End: 2021-08-26

## 2021-08-26 MED ORDER — sodium chloride 0.9 % flush 10 mL
Freq: Once | INTRAMUSCULAR | Status: AC
Start: 2021-08-26 — End: 2021-08-26
  Administered 2021-08-26: 15:00:00 10 mL via INTRAVENOUS

## 2021-08-26 MED ORDER — immune globulin (human) (Gammagard) infusion 30 g
10 | Freq: Once | INTRAMUSCULAR | Status: AC
Start: 2021-08-26 — End: 2021-08-26
  Administered 2021-08-26: 15:00:00 30 g via INTRAVENOUS

## 2021-08-26 MED FILL — IMMUNE GLOB,GAMMA (IGG) 10 %-GLY-IGA OVER 50 MCG/ML INJECTION SOLUTION: 10 10 % | INTRAMUSCULAR | Qty: 300

## 2021-08-26 NOTE — Progress Notes (Signed)
Endocrinology Clinic Note  [name] is a 61 y.o. female seen in transfer of care for type 2 diabetes.     Interval history:   Returned from [facility] where [name] was for last few months other than coming up for infusions.   Overall doing well.   Stopped glipizide after last visit. No more lows. BG 120s-160s, highest 180s even after eating, which is higher than [name]'s used to but quite good overall. No more lows.   Forgot monitor today.   Had reclast infusion 06/21/21, so didn't need april infusion. Some fatigue but no other side effects.   Has issues with ears - can't get water out of ears - wanted an exam. Does use a q-tip in ears.   Prednisone 10mg  daily currently.   No UTIs  No yeast infections    Diabetes History:  History of scleroderma and seronegative RA (on prednisone), IgG-4 deficiency, NASH and previously uncontrolled T2DM.   Through the years, [name] A1c have been within or near target (6-7s), except for a period in 2017-2019 in which A1c was 8-11% (prednisone). Since late 2020, A1cs have been within target.  On metformin and Jardiance, tolerated well.   Stopped glipizide in 02/23 due to hypoglycemia and A1c 6.1%.     Current diabetes medications:   Metformin 500mg  XR x 4 per day  Jardiance 10mg  daily     Glycemic Control:   A1c Not available today - last one 6.1% on Glipizide 2.5mg  XL    Self-Monitoring 120s, 140s, 160s generally - checks both fasting and postprandial random   Hypoglycemia No lows   Food/exercise Limited activity due to back and hamstring surgery  Does walking and squats   Other factors affecting management Gastroparesis    Prednisone low dose for scleroderma   Elevated liver enzymes - seeing GI, etiology still uncertain. On ursodiol.   After clinic - sensor coverage -   Libre 2 $40/mo  Dexcom sensor $40/mo  Dexcom transmitter $40/mo     Complications:   Nephropathy  N   Retinopathy  N; checked in 2022. Going to be seeing ophthalmologist for plaquenil    Neuropathy  N   Foot ulcer  N    Macrovascular  N   Risk reduction  on ACE inhibitor   Previous notes state rosuvastatin ?reason discontinued     Bone history:   Has been going for DEXAs over time  Risk factors for fracture - long term prednisone use, inflammatory disease including seronegative RA and systemic sclerosis, gastroparesis with unpredictable N/V/Diarrhea, previous fractures, mobility limitations.   Wrist # in 1985 - needed plate in arm. During judo, bent backwards, initially casted but not aligned enough.   Foot # traumatic  Scoliosis and spinal stenosis. Had spinal fusion surgery L4-L5  No kidney disease  No other risk factors for osteoporosis  No falls in the interim     Bone Treatment History:  Zoledronic acid IV 5mg  03/23 first dose  1000 units vitamin D3 a day  calcium supplement 1 pill/day    ROS: 10 point ROS negative except noted in history above    PAST MEDICAL HISTORY:   Patient Active Problem List   Diagnosis   . CVID (common variable immunodeficiency) (CMS/HCC)   . Gastroesophageal reflux disease without esophagitis   . IgG4 deficiency (CMS/HCC)   . Systemic sclerosis, unspecified (CMS/HCC)   . Seronegative rheumatoid arthritis (CMS/HCC)   . Spinal stenosis, lumbar region, with neurogenic claudication   . Type 2  diabetes mellitus without complication (CMS/HCC)   . Left hip pain   . Long-term use of high-risk medication   . Abnormal liver function tests   . Hypogammaglobulinemia (CMS/HCC)   . Scoliosis   . Varicose veins of both lower extremities   . Trigger thumb, right thumb   . Arthritis of hand, left   . Diarrhea   . Scleroderma progressive (CMS/HCC)   . Urge incontinence   . Chronic pain syndrome   . Current chronic use of systemic steroids   . Leg swelling   . Primary insomnia   . Screening mammogram for breast cancer   . CREST syndrome (CMS/HCC)   . Elevated alkaline phosphatase level   . Immunocompromised state due to drug therapy (CMS/HCC)   . Paget's disease of bony pelvis   . Osteopenia   . Routine general  medical examination at a health care facility   . Acute pain of right knee   . Steroid-induced osteoporosis   . Nausea vomiting and diarrhea   . Pain in left elbow   . Elevated serum GGT level   . Gastroparesis   . Small intestinal bacterial overgrowth (SIBO)     MEDICATIONS:   Current Outpatient Medications:   .  ascorbic acid (Vitamin C) 500 mg tablet, Take 500 mg by mouth., Disp: , Rfl:   .  baclofen (Lioresal) 10 mg tablet, Take 1 tablet (10 mg) by mouth if needed at bedtime for muscle spasms., Disp: 30 tablet, Rfl: 0  .  calcium carbonate-vitamin D3 500 mg-5 mcg (200 unit) tablet, Take 1 tablet by mouth in the morning., Disp: , Rfl:   .  Clinpro 5000 1.1 % paste, SMARTSIG:Sparingly To Teeth, Disp: , Rfl:   .  cyanocobalamin (Vitamin B-12) 1,000 mcg tablet, Take 100 mcg by mouth in the morning., Disp: , Rfl:   .  hydrocortisone 0.5 % ointment, Apply 1 application topically if needed in the morning and at bedtime for rash., Disp: 28.4 g, Rfl: 11  .  hydroxychloroquine (Plaquenil) 200 mg tablet, TAKE 1 TABLET BY MOUTH EVERY DAY WITH FOOD OR MILK, Disp: 90 tablet, Rfl: 1  .  immune globulin, human, (Gammagard) infusion, Receives infusion every 4 weeks, next dose 8/5, Disp: , Rfl:   .  Jardiance 10 mg, Take 1 tablet (10 mg) by mouth in the morning., Disp: 90 tablet, Rfl: 3  .  ketoconazole (NIZOral) 2 % cream, Apply 1 application topically in the morning., Disp: 30 g, Rfl: 1  .  lidocaine (Lidoderm) 5 % patch, APPLY 1 PATCH TOPICALLY IN THE MORNING. REMOVE & DISCARD PATCH WITHIN 12 HOURS OR AS DIRECTED BY MD., Disp: 30 patch, Rfl: 3  .  lidocaine (LMX) 4 % cream, Apply 1 application topically if needed in the morning, at noon, in the evening, and at bedtime. (Patient not taking: Reported on 06/01/2021), Disp: , Rfl:   .  lisinopril 2.5 mg tablet, TAKE 1 TABLET BY MOUTH IN THE MORNING, Disp: 90 tablet, Rfl: 1  .  metFORMIN XR (Glucophage-XR) 500 mg 24 hr tablet, Take 4 tablets (2,000 mg) by mouth with breakfast. Do not  crush, chew, or split., Disp: 360 tablet, Rfl: 3  .  mycophenolate (Cellcept) 500 mg tablet, TAKE 3 TABLETS IN THE MORNING AND TWO TABLETS AT BEDTIME (Patient taking differently: Take 500 mg by mouth twice daily. Take 2 tablets in the morning and 1 tablets at bedtime), Disp: 450 tablet, Rfl: 1  .  omeprazole (PriLOSEC) 40 mg DR  capsule, Take 1 capsule (40 mg) by mouth before breakfast and before evening meal., Disp: 180 capsule, Rfl: 2  .  oxyCODONE (Roxicodone) 10 mg immediate release tablet, Take 1 tablet (10 mg) by mouth once daily. (Patient taking differently: Take 10 mg by mouth if needed each day.), Disp: 28 tablet, Rfl: 0  .  predniSONE (Deltasone) 10 mg tablet, TAKE 2 TABLETS (20 MG) BY MOUTH IN THE MORNING., Disp: 60 tablet, Rfl: 6  .  pregabalin (Lyrica) 50 mg capsule, TAKE 1 CAPSULE (50 MG) BY MOUTH IN THE MORNING AND AT BEDTIME, Disp: 60 capsule, Rfl: 2  .  traZODone (Desyrel) 100 mg tablet, Take 2 tablets (200 mg) by mouth if needed at bedtime for sleep., Disp: 90 tablet, Rfl: 1  .  ursodiol (Actigall) 500 mg tablet, TAKE 1 TABLET BY MOUTH TWICE A DAY FOR 90 DAYS, Disp: 180 tablet, Rfl: 2  No current facility-administered medications for this visit.    SOCIAL HISTORY:   No smoking. Alcohol - rarely, few times a year, no rec drugs  Lives alone in apartment. Has support from neighbors.   Supplies are covered by insurance, not finding it onerous.     EXAMINATION:  BP 118/81 (BP Location: Right arm, Patient Position: Sitting, BP Cuff Size: Adult)   Pulse 91   Ht 1.57 m   Wt 67.6 kg   SpO2 96%   BMI 27.42 kg/m    General: Appears well, NAD, well-nourished. [name] appears tanned ([name] did spend a few months in [facility]).  Eyes: No conjunctival injection, normal EOM  HEENT: otoscopy - tympanic membranes intact and not inflamed. No obvious effusion. There is some evidence of trauma (small reddish/brown scab) in the R ear canal, none in left.   CVS: Normal heart sounds, no murmurs. Peripheral pulses  present. No peripheral edema.  Resp: CTAB, normal respiratory effort.   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 Well-perfused, no scarring or gangrene.   Lumbar spine curvature affected by instrumentation. No tenderness.     FLOWSHEET   Date  A1c  LDL-C Urine ACR Cr (eGFR)  BP  Weight  Other   10/21 6.1         06/22 6.4   0.75 (93) 107/76 67.6kg    11/22 6.6 85 32       02/23 6.1   0.51 (108) 118/81 67.6kg      Lab Results   Component Value Date    HGBA1C 6.1 05/20/2021    MICROALBCREA 32 (H) 02/23/2021    CREATININE 0.51 (L) 07/29/2021    EGFR 108 07/29/2021    LDLCALC 85 02/23/2021     Investigations  Date BMD (g/cm2) T-score Notes    Prox femur  total hip  distal forearm  prox femur  total hip  distal forearm    01/20  0.819  0.008  0.735  -0.3  -0.5  0.7    11/22 (1.306) L-spine* 0.674 0.679 3.0 -1.6 -2.2 23.4% decrease in total hip  13.2% decrease in distal forearm  FRAX-TBS 21% MOF / 1.9% Hip #   *L spine - extensive degenerative changes, not reliable    ASSESSMENT AND PLAN  1. Type 2 diabetes with A1c at target. Significant factors in [name] management include CVID with multiple autoimmune conditions, chronic prednisone use for rheumatologic disorders, presence of sclerodactyly impacting finger prick testing, and liver enzyme elevation NYD (suspected to be autoimmune hepatitis, biopsy deferred for surgery last  visit).     SMBG in target (<130 fasting, <180 random) without lows after stopping low-dose glipizide.   A1c pending - [name] can do with next bloodwork.   Continue metformin 2g, jardiance 10mg . Refills up to date.   May stretch follow-up to every 6 months unless new concerns with BG/diabetes meds come up.     2.  Osteoporosis  Osteopenic range BMD with high risk for fracture on FRAX TBS, interval severe decreases in the total hip and distal forearm density over the last 2 years, with significant risk factors for fracture  including previous fracture, inflammatory disorder, long-term prednisone use.  Additional considerations include scleroderma with esophageal involvement, unpredictable episodes of nausea/vomiting/diarrhea.    [name] has received one dose of reclast in 03/23 and tolerated it well. No falls or fractures in the interim.   -DEXA in Nov 2023   -Bloodwork in Jan-Feb 2024 including vitamin D, calcium, creatinine, and c-telopeptide level   -Visit in Jan-Feb 2024 to decide on Reclast Mar 2024.   -Continue vitamin D 1000 units daily and daily calcium intake of 1200 mg through diet and supplement.  -Continue weightbearing exercise as tolerated    3. Other issues:   BP - on target  LDL - on target  Ear - no obvious inflammation or effusion on exam, but there is some trauma in the ear canal. Advised to avoid q-tips in ear  Sweating - no signs of hyperthyroidism (TSH normal). Description not suggestive of carcinoid or pheochromocytoma. If persistent and bothersome, may speak with PCP, starting with making sure age-appropriate ca screening up to date.     Follow-up in ~Jan-Feb 2024. Call sooner if persistent high BG, fracture, or clinical change, eg prednisone increase    Ephriam JenkinsLinda Billee Balcerzak, MD   Endocrinology, Diabetes and Metabolism    ---  45 minutes required to complete visit, including reviewing investigations and prior documentation in transfer of care, face-to-face assessment about existing diabetes and steroid-induced osteoporosis, counselling about bone treatment, discussion, med adjustment, and documentation.

## 2021-08-26 NOTE — Patient Instructions (Addendum)
Bone density november 2023  Visit in early 2024 - bloodwork   Infusion in March 2024 if all looks good

## 2021-08-26 NOTE — Progress Notes (Signed)
Patient arrived to the IC for monthly IVIG infusion. Endorses no new c/o and feels well today. PIV placed without difficulty. Received IVIG infusion per protocol without incident. VSS throughout. Discharged in NAD, to RTC 6/23.

## 2021-09-02 NOTE — Telephone Encounter (Signed)
[place]and she wants recommendations from good doctors that DR, Lorella NimrodHarvey knows to continue with his treatments there pt # [phone], thank you.

## 2021-09-08 ENCOUNTER — Ambulatory Visit: Payer: PRIVATE HEALTH INSURANCE | Attending: Registered Nurse

## 2021-09-09 MED ORDER — famotidine (Pepcid) 20 mg tablet
20 | ORAL_TABLET | Freq: Every day | ORAL | 1 refills | Status: DC | PRN
Start: 2021-09-09 — End: 2021-11-15

## 2021-09-09 NOTE — Telephone Encounter (Signed)
Pt is calling to get a new prescription sent over to the pharmacy for a refill for the rx famotidine the pharmacy is listed in charts thank you

## 2021-09-09 NOTE — Telephone Encounter (Signed)
Last Visit: 07/29/21  Next Visit: n/a  Diagnosis: RA  Labs: 07/29/21

## 2021-09-12 MED ORDER — oxyCODONE (Roxicodone) 10 mg immediate release tablet
10 | ORAL_TABLET | Freq: Every day | ORAL | 0 refills | 8.00000 days | Status: DC | PRN
Start: 2021-09-12 — End: 2021-11-15

## 2021-09-13 MED ORDER — traZODone (Desyrel) 100 mg tablet
100 | ORAL_TABLET | Freq: Every evening | ORAL | 1 refills | Status: DC | PRN
Start: 2021-09-13 — End: 2021-11-15

## 2021-09-13 NOTE — Telephone Encounter (Signed)
Last Visit: 07/29/21  Next Visit: 05/03/22  Diagnosis: Nausea vomiting and diarrhea; Small intestinal bacterial overgrowth    Labs: 07/29/21

## 2021-09-13 NOTE — Telephone Encounter (Addendum)
Hello,  Patient called to request a refill of:     xifaxan and Forensic psychologist, pharmacy address, and phone number:    [facility]     Informed patient it can take 2 business days to send prescription refill to pharmacy.     Verified insurance with patient OR in Epic (select one)     Call back number: [phone]    Thank you!

## 2021-09-14 ENCOUNTER — Inpatient Hospital Stay: Admit: 2021-09-14 | Payer: PRIVATE HEALTH INSURANCE

## 2021-09-14 ENCOUNTER — Ambulatory Visit: Admit: 2021-09-14 | Discharge: 2021-09-14 | Payer: PRIVATE HEALTH INSURANCE | Attending: Hand Surgery

## 2021-09-14 DIAGNOSIS — Z96612 Presence of left artificial shoulder joint: Secondary | ICD-10-CM

## 2021-09-14 DIAGNOSIS — M25522 Pain in left elbow: Secondary | ICD-10-CM

## 2021-09-14 DIAGNOSIS — M25511 Pain in right shoulder: Secondary | ICD-10-CM

## 2021-09-14 MED ORDER — betamethasone acet,sod phos (Celestone) injection 3 mg
6 | Freq: Once | INTRAMUSCULAR | Status: AC | PRN
Start: 2021-09-14 — End: 2021-09-14
  Administered 2021-09-14: 17:00:00 3 mg via INTRA_ARTICULAR

## 2021-09-14 MED ORDER — lidocaine (Xylocaine) 10 mg/mL (1 %) injection 0.5 mL
10 | Freq: Once | INTRAMUSCULAR | Status: AC | PRN
Start: 2021-09-14 — End: 2021-09-14
  Administered 2021-09-14: 17:00:00 .5 mL via INTRAMUSCULAR

## 2021-09-14 MED ORDER — Xifaxan 550 mg tablet
550 | ORAL_TABLET | Freq: Three times a day (TID) | ORAL | 2 refills | 30.00000 days | Status: AC
Start: 2021-09-14 — End: ?

## 2021-09-14 MED ORDER — ondansetron (Zofran) 4 mg tablet
4 | ORAL_TABLET | Freq: Three times a day (TID) | ORAL | 0 refills | Status: DC | PRN
Start: 2021-09-14 — End: 2021-11-22
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
## 2021-09-16 ENCOUNTER — Ambulatory Visit: Admit: 2021-09-16 | Discharge: 2021-09-16 | Payer: PRIVATE HEALTH INSURANCE

## 2021-09-16 DIAGNOSIS — M340 Progressive systemic sclerosis: Secondary | ICD-10-CM

## 2021-09-16 DIAGNOSIS — D801 Nonfamilial hypogammaglobulinemia: Secondary | ICD-10-CM

## 2021-09-16 MED ORDER — immune globulin (human) (Gammagard) infusion 30 g
10 | Freq: Once | INTRAMUSCULAR | Status: AC
Start: 2021-09-16 — End: 2021-09-16
  Administered 2021-09-16: 15:00:00 30 g via INTRAVENOUS

## 2021-09-16 MED ORDER — sodium chloride 0.9 % flush 10 mL
Freq: Once | INTRAMUSCULAR | Status: DC | PRN
Start: 2021-09-16 — End: 2021-09-16

## 2021-09-16 MED ORDER — sodium chloride 0.9 % flush 10 mL
Freq: Once | INTRAMUSCULAR | Status: AC
Start: 2021-09-16 — End: 2021-09-16
  Administered 2021-09-16: 15:00:00 10 mL via INTRAVENOUS

## 2021-09-16 MED FILL — IMMUNE GLOB,GAMMA (IGG) 10 %-GLY-IGA OVER 50 MCG/ML INJECTION SOLUTION: 10 10 % | INTRAMUSCULAR | Qty: 300

## 2021-09-16 NOTE — Progress Notes (Signed)
Patient arrived to the IC for monthly IVIG infusion a week early, as she is moving to [place]. Endorses no new c/o and feels well today. PIV placed and received IVIG infusion per protocol without incident. VSS throughout. Discharged in NAD, to RTC 7/21 unless she can have IVIG infusions arranged in [place].

## 2021-09-16 NOTE — Telephone Encounter (Signed)
pt is fu on call made on 09/02/2021 regarding recommendations for a new doctor in [facility] [place] [phone] thank you

## 2021-09-20 NOTE — Telephone Encounter (Signed)
Spoke with Davyn and relayed the message.  Will also send her a Mychart message since she was driving.

## 2021-10-03 NOTE — Telephone Encounter (Signed)
Fax refill request received from [id number]    TWICE A DAY, good until 12/14/21    Too soon    Derinda Late, RN  10/03/2021  6:41 PM

## 2021-10-14 ENCOUNTER — Ambulatory Visit: Payer: PRIVATE HEALTH INSURANCE | Attending: Hand Surgery

## 2021-10-17 ENCOUNTER — Ambulatory Visit: Admit: 2021-10-17 | Discharge: 2021-10-18 | Payer: PRIVATE HEALTH INSURANCE

## 2021-10-17 DIAGNOSIS — D801 Nonfamilial hypogammaglobulinemia: Secondary | ICD-10-CM

## 2021-10-17 DIAGNOSIS — M340 Progressive systemic sclerosis: Secondary | ICD-10-CM

## 2021-10-17 MED ORDER — sodium chloride 0.9 % flush 10 mL
Freq: Once | INTRAMUSCULAR | Status: AC
Start: 2021-10-17 — End: 2021-10-17
  Administered 2021-10-17: 18:00:00 10 mL via INTRAVENOUS

## 2021-10-17 MED ORDER — famotidine (Pepcid) injection 20 mg
20 | Freq: Once | INTRAVENOUS | Status: DC | PRN
Start: 2021-10-17 — End: 2021-10-17

## 2021-10-17 MED ORDER — EPINEPHrine HCl (PF) (Adrenalin) injection 0.3 mg
1 | Freq: Once | INTRAMUSCULAR | Status: DC | PRN
Start: 2021-10-17 — End: 2021-10-17

## 2021-10-17 MED ORDER — acetaminophen (Tylenol) tablet 650 mg
325 | Freq: Once | ORAL | Status: DC | PRN
Start: 2021-10-17 — End: 2021-10-17

## 2021-10-17 MED ORDER — immune globulin (human) (Gammagard) infusion 30 g
10 | Freq: Once | INTRAMUSCULAR | Status: AC
Start: 2021-10-17 — End: 2021-10-17
  Administered 2021-10-17: 19:00:00 30 g via INTRAVENOUS

## 2021-10-17 MED ORDER — sodium chloride 0.9 % flush 10 mL
Freq: Once | INTRAMUSCULAR | Status: DC | PRN
Start: 2021-10-17 — End: 2021-10-17

## 2021-10-17 MED ORDER — hydrocortisone sod succinate (Solu-CORTEF) injection 100 mg
100 | Freq: Once | INTRAMUSCULAR | Status: DC | PRN
Start: 2021-10-17 — End: 2021-10-17

## 2021-10-17 MED ORDER — diphenhydrAMINE (BENADryl) injection 50 mg
50 | Freq: Once | INTRAMUSCULAR | Status: DC | PRN
Start: 2021-10-17 — End: 2021-10-17

## 2021-10-17 MED FILL — IMMUNE GLOB,GAMMA (IGG) 10 %-GLY-IGA OVER 50 MCG/ML INJECTION SOLUTION: 10 10 % | INTRAMUSCULAR | Qty: 300

## 2021-10-17 NOTE — Progress Notes (Signed)
Pt at IC for monthly IVIG infusion. Pt has no new c.o today. Pt received IVIG w out difficulty. Ambulatory and stable upon dc and will be returning on 8/25.

## 2021-10-21 NOTE — Telephone Encounter (Signed)
PT called and is requesting a referral to be sent out to [facility].  PT states it does not need to be to anyone specific and if it could also include the current treatment plan she is on/any previous notes from the Rheumatologist.    Fax: [phone]  CB: [phone]  Thank you!

## 2021-10-24 MED ORDER — omeprazole (PriLOSEC) 40 mg DR capsule
40 | ORAL_CAPSULE | Freq: Two times a day (BID) | ORAL | 1 refills | 60.00000 days | Status: AC
Start: 2021-10-24 — End: ?

## 2021-10-24 NOTE — Telephone Encounter (Signed)
Last Visit: 07/29/21   Next Visit: 05/03/22   Diagnosis: Seronegative rheumatoid arthritis   Labs: 07/29/21

## 2021-10-24 NOTE — Telephone Encounter (Signed)
[facility] [place] [facility].     Pt moved to [facility] and had asked for the name of a rheumatologist in [place]. Per Epic, she has an appt on 05/03/22 with Rheumatologist in [facility] .    LVM for pt to call back if she needs a refill until her appt in [employer], and if so, to let us know what pharmacy to send it to.

## 2021-10-24 NOTE — Addendum Note (Signed)
Addended by: Rodena GoldmannJACHOWICZ, Cinzia Devos on: 10/24/2021 01:23 PM     Modules accepted: Orders

## 2021-10-24 NOTE — Telephone Encounter (Signed)
Faxing script and notes.

## 2021-10-24 NOTE — Telephone Encounter (Signed)
See previous note from today.    Pt called back and LVM saying to go ahead and refill the omeprazole. Pharmacy updated.     Last Visit: 07/29/21   Next Visit[place] 05/03/[place] in [place]  Diagnosis: Gastroesophageal reflux disease without esophagitis   Labs: 07/29/21

## 2021-11-15 MED ORDER — pregabalin (Lyrica) 50 mg capsule
50 | ORAL_CAPSULE | Freq: Two times a day (BID) | ORAL | 2 refills | Status: AC
Start: 2021-11-15 — End: 2022-02-13

## 2021-11-15 MED ORDER — famotidine (Pepcid) 20 mg tablet
20 | ORAL_TABLET | Freq: Every day | ORAL | 1 refills | Status: AC | PRN
Start: 2021-11-15 — End: 2022-02-13

## 2021-11-15 MED ORDER — oxyCODONE (Roxicodone) 10 mg immediate release tablet
10 | ORAL_TABLET | Freq: Every day | ORAL | 0 refills | 8.00000 days | Status: DC | PRN
Start: 2021-11-15 — End: 2021-11-17

## 2021-11-15 MED ORDER — lidocaine (Lidoderm) 5 % patch
5 | MEDICATED_PATCH | Freq: Every day | TOPICAL | 3 refills | 30.00000 days | Status: AC
Start: 2021-11-15 — End: ?

## 2021-11-15 NOTE — Telephone Encounter (Signed)
Patient called to request a refill of     [famotidine (Pepcid) 20 mg tablet   lidocaine (Lidoderm) 5 % patch     oxyCODONE (Roxicodone) 10 mg immediate release tablet  pregabalin (Lyrica) 50 mg capsule    [facility] [address] [place] , [place]    Patient  last office visit was:05/20/21    Script prescribed ZO:XWRUEAVWUby:[name], [name] KENNEDY      Informed patient it can take 3 business days to send prescription refill to pharmacy.    Verified insurance with patient/in Epic (select one)    Call back number: [phone]

## 2021-11-15 NOTE — Assessment & Plan Note (Addendum)
PMP reviewed, no other prescribers or unexpected opioid prescriptions  28/28 day supply  Last filled/due:  09/12/21  Next due on/after:  Ok for refill today    Last appt:  05/20/2021  Next appt:   Visit date not found  Last UDS: 02/23/21, was ordered on 05/18/21 but never done  Last contract:   05/20/21

## 2021-11-17 NOTE — Telephone Encounter (Signed)
[facility] [place] [phone]  alternative requested for OXYCODONE 10mg     - does not meet [place] requirements  In [place] must state     FOR NON-Acute pain" if it is chronic pain  If acute pain can only send up to 7 days if write" for Acute pain exception"    willfwd jill PA, Divya PA, Dr Dolores Frame RN   11/17/21   1:47 PM

## 2021-11-17 NOTE — Telephone Encounter (Signed)
PMP reviewed, no other prescribers or unexpected opioid prescriptions  28/28 day supply  Last filled/due:  09/12/21  Next due on/after:  Ok for refill today    Last appt:  05/20/2021  Next appt:   Visit date not found  Last UDS:02/23/21, was ordered on 05/18/21 but never done  Last contract:05/20/21    Will pended for PCP coverage Dr. Algernon HuxleyNaef  Will add for chronic pain indication [facility] [place] as requested by pharmacy    [name] Jamesivya Herley Bernardini, PA

## 2021-11-18 MED ORDER — oxyCODONE (Roxicodone) 10 mg immediate release tablet
10 | ORAL_TABLET | Freq: Every day | ORAL | 0 refills | 8.00000 days | Status: AC | PRN
Start: 2021-11-18 — End: ?

## 2021-11-21 NOTE — Telephone Encounter (Signed)
Patient called to request a refill of     ondansetron (Zofran) 4 mg tablet    [facility]    Patient  last office visit was: 07/29/21    Script prescribed by: Rose Phi      Informed patient it can take 2 business days to send prescription refill to pharmacy.    Call back number: [phone]

## 2021-11-22 NOTE — Telephone Encounter (Signed)
Last Visit: 07/29/21   Next Visit: 05/03/22 with Rheumatologist in [place] .  Labs: 07/29/21   Dx:  Nausea vomiting and diarrhea

## 2021-11-23 MED ORDER — ondansetron (Zofran) 4 mg tablet
4 | ORAL_TABLET | Freq: Three times a day (TID) | ORAL | 0 refills | Status: AC | PRN
Start: 2021-11-23 — End: ?

## 2021-11-28 NOTE — Telephone Encounter (Signed)
[facility] 11/[facility]/21 [facility] [place] [facility]

## 2021-12-09 ENCOUNTER — Ambulatory Visit: Admit: 2021-12-09 | Discharge: 2021-12-09 | Payer: PRIVATE HEALTH INSURANCE

## 2021-12-09 DIAGNOSIS — D801 Nonfamilial hypogammaglobulinemia: Secondary | ICD-10-CM

## 2021-12-09 DIAGNOSIS — M340 Progressive systemic sclerosis: Secondary | ICD-10-CM

## 2021-12-09 MED ORDER — hydrocortisone sod succ (PF) (Solu-CORTEF) injection 100 mg
100 | Freq: Once | INTRAMUSCULAR | Status: DC | PRN
Start: 2021-12-09 — End: 2021-12-09
  Administered 2021-12-09: 16:00:00 100 mg via INTRAVENOUS

## 2021-12-09 MED ORDER — immune globulin (human) (Gammagard) solution 30 g
10 | Freq: Once | INTRAMUSCULAR | Status: AC
Start: 2021-12-09 — End: 2021-12-09
  Administered 2021-12-09: 15:00:00 30 g via INTRAVENOUS

## 2021-12-09 MED ORDER — sodium chloride 0.9 % flush 10 mL
Freq: Once | INTRAMUSCULAR | Status: DC
Start: 2021-12-09 — End: 2021-12-09

## 2021-12-09 MED ORDER — famotidine (Pepcid) injection 20 mg
20 | Freq: Once | INTRAVENOUS | Status: DC | PRN
Start: 2021-12-09 — End: 2021-12-09
  Administered 2021-12-09: 16:00:00 20 mg via INTRAVENOUS

## 2021-12-09 MED ORDER — diphenhydrAMINE (BENADryl) injection 50 mg
50 | Freq: Once | INTRAMUSCULAR | Status: DC | PRN
Start: 2021-12-09 — End: 2021-12-09
  Administered 2021-12-09: 16:00:00 25 mg via INTRAVENOUS

## 2021-12-09 MED ORDER — EPINEPHrine HCl (PF) (Adrenalin) injection 0.3 mg
1 | Freq: Once | INTRAMUSCULAR | Status: DC | PRN
Start: 2021-12-09 — End: 2021-12-09

## 2021-12-09 MED ORDER — sodium chloride 0.9 % flush 10 mL
Freq: Once | INTRAMUSCULAR | Status: DC | PRN
Start: 2021-12-09 — End: 2021-12-09

## 2021-12-09 MED ORDER — acetaminophen (Tylenol) tablet 650 mg
325 | Freq: Once | ORAL | Status: DC | PRN
Start: 2021-12-09 — End: 2021-12-09
  Administered 2021-12-09: 16:00:00 650 mg via ORAL

## 2021-12-09 MED FILL — IMMUNE GLOB,GAMMA (IGG) 10 %-GLY-IGA OVER 50 MCG/ML INJECTION SOLUTION: 10 10 % | INTRAMUSCULAR | Qty: 300

## 2021-12-09 NOTE — Progress Notes (Signed)
IVIG with REACTION  Pt presented to the IC today for IVIG infusion. She feels at her baseline, no new c/o.  She stated "my premeds were stopped a while ago because I never had any issues with the IVIG". No labs ordered.   1055  IVIG started   1213  Called to room for pt having reaction, shaking chills, no SOB or chest tightness  1214  T 36.9 po, BP 107/86, HR 87, RR22, O2 sat 100% RA  1216  benadryl 25 mg, hct 100mg , pepcid 20mg , tylenol 650mg    1221  Still shaking and "cold" , benadryl 25mg   1229  BP 125/78, HR 78, RR 20, O2 sat 99  , feeling better, shaking resolving  1240 Dr Lorella NimrodHarvey to bedside, ok to rechallenge after vss, symptoms subside  1340  Pt feels back to baseline, BP 123/75/, HR 86, RR 20, O2 sat 97  1342 IVIG restarted at .02mg /kg/hr titrated to max rate .o8 mg/kg/hr.  Completed infusion with no signs of adverse reaction.  VSS pre-restart and post.  D/C from IC in stable ambulatory condition on her own.  [place]

## 2021-12-16 NOTE — Telephone Encounter (Signed)
Faxing.Marland KitchenMarland Kitchen

## 2021-12-16 NOTE — Telephone Encounter (Signed)
Ronnett call req her Last  3 Infusion  Offices  treatment notes with all information , [facility]  [facility] [employer] [facility] /fax [phone] , pt# [phone], thank you.

## 2021-12-27 ENCOUNTER — Ambulatory Visit: Payer: PRIVATE HEALTH INSURANCE | Attending: Physician Assistant

## 2022-01-31 IMAGING — DX SCOLIOSIS SERIES 2 VIEWS
2 series · 4 of 4 positions shown · non-contrast
Comparison: None.

________________________________________________________________________________________________ 
SCOLIOSIS SERIES 2 VIEWS, LUMBAR SPINE AP, LAT WITH FLEXION AND EXTEN,, HIP 
BILATERAL WITH PELVIS 5 VIEWS, 01/31/2022 [DATE]: 
CLINICAL INDICATION: Age-related Osteoporosis Without Current Pathological  
Fracture

[Series 1: AP · 0.14mm/px · 2 of 2 slices shown (1 of 2)]
[im 1/2]
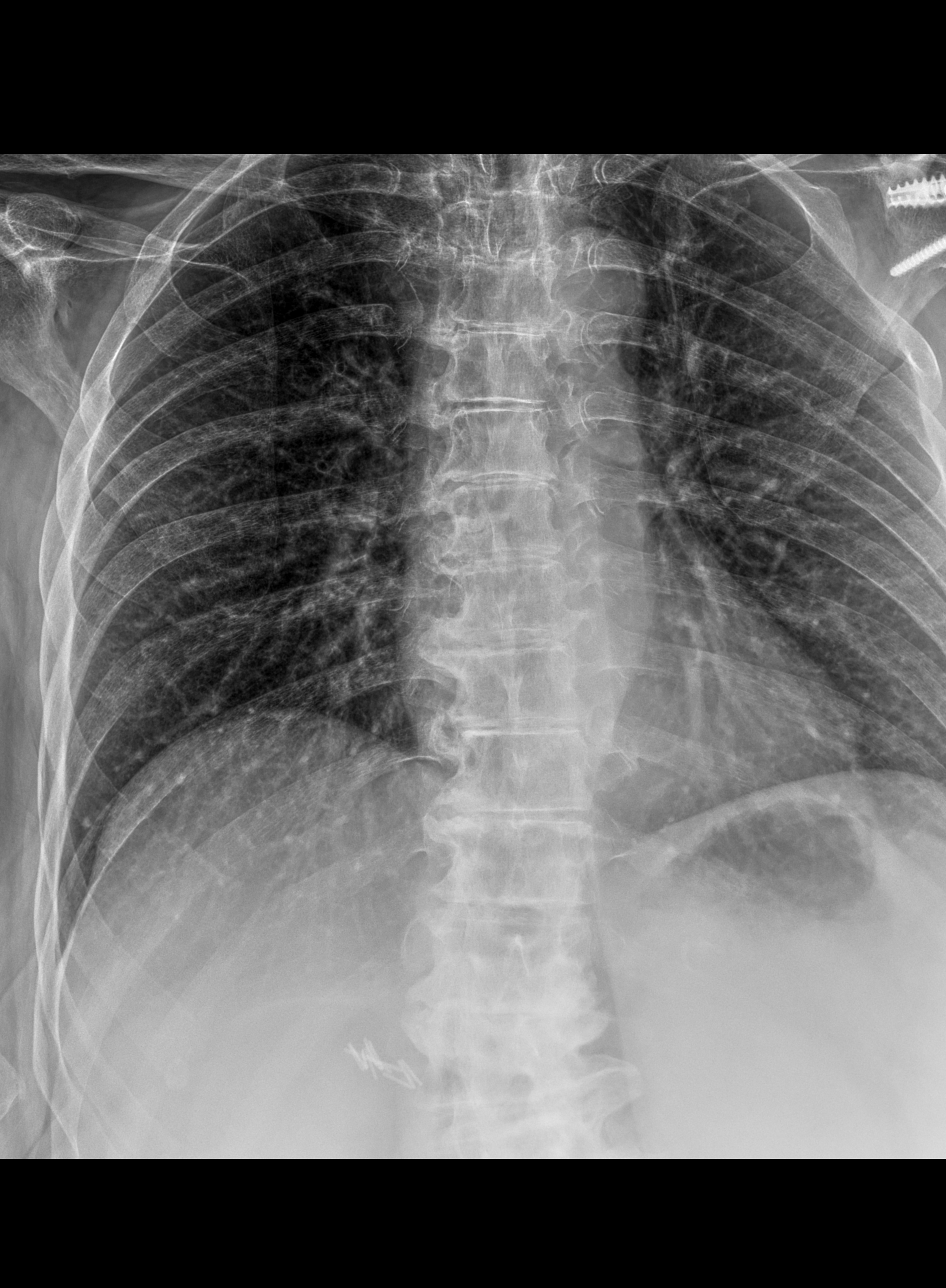
[im 2/2]
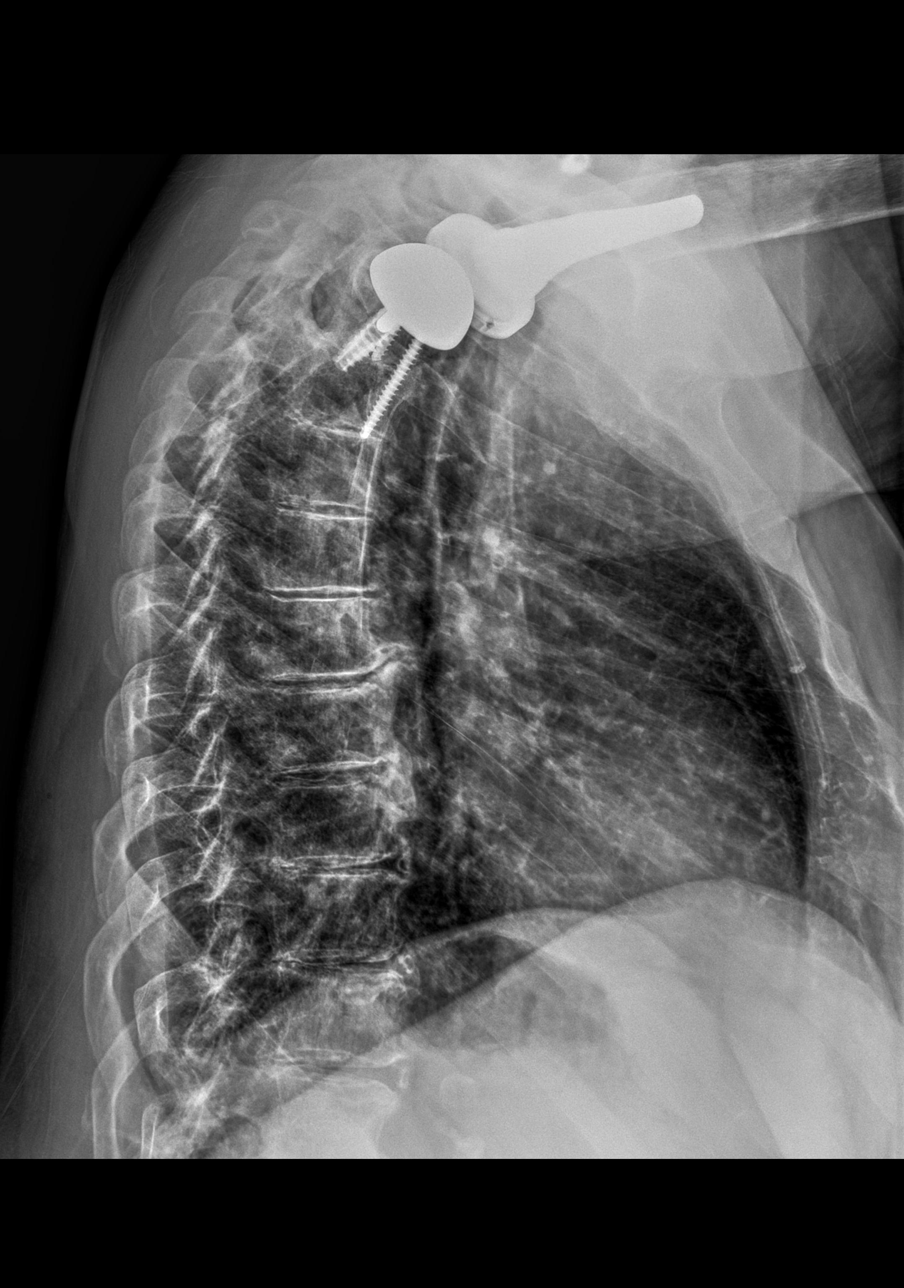

[Series 2: AP · 0.14mm/px · 2 of 2 slices shown (2 of 2)]
[im 1/2]
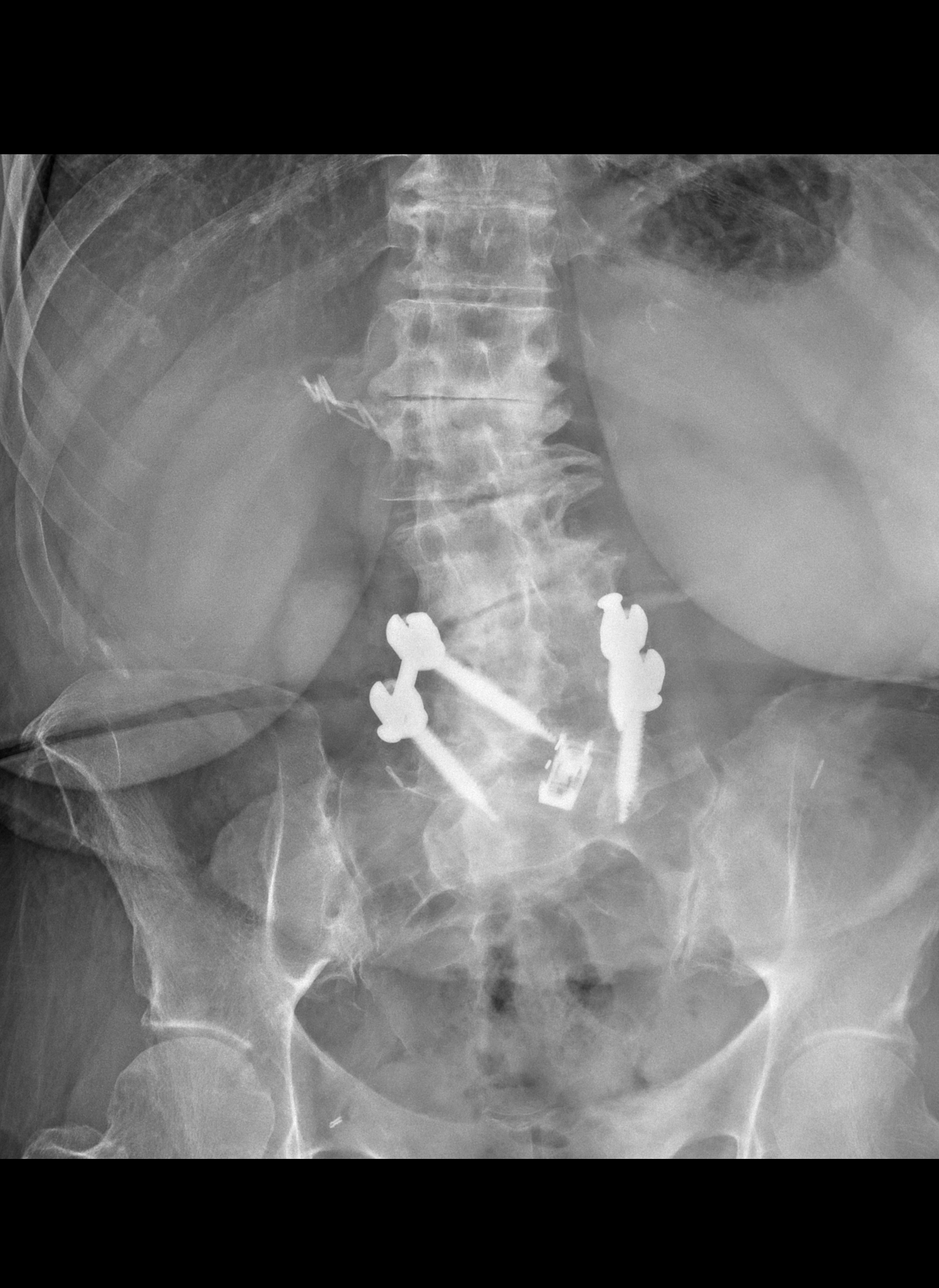
[im 2/2]
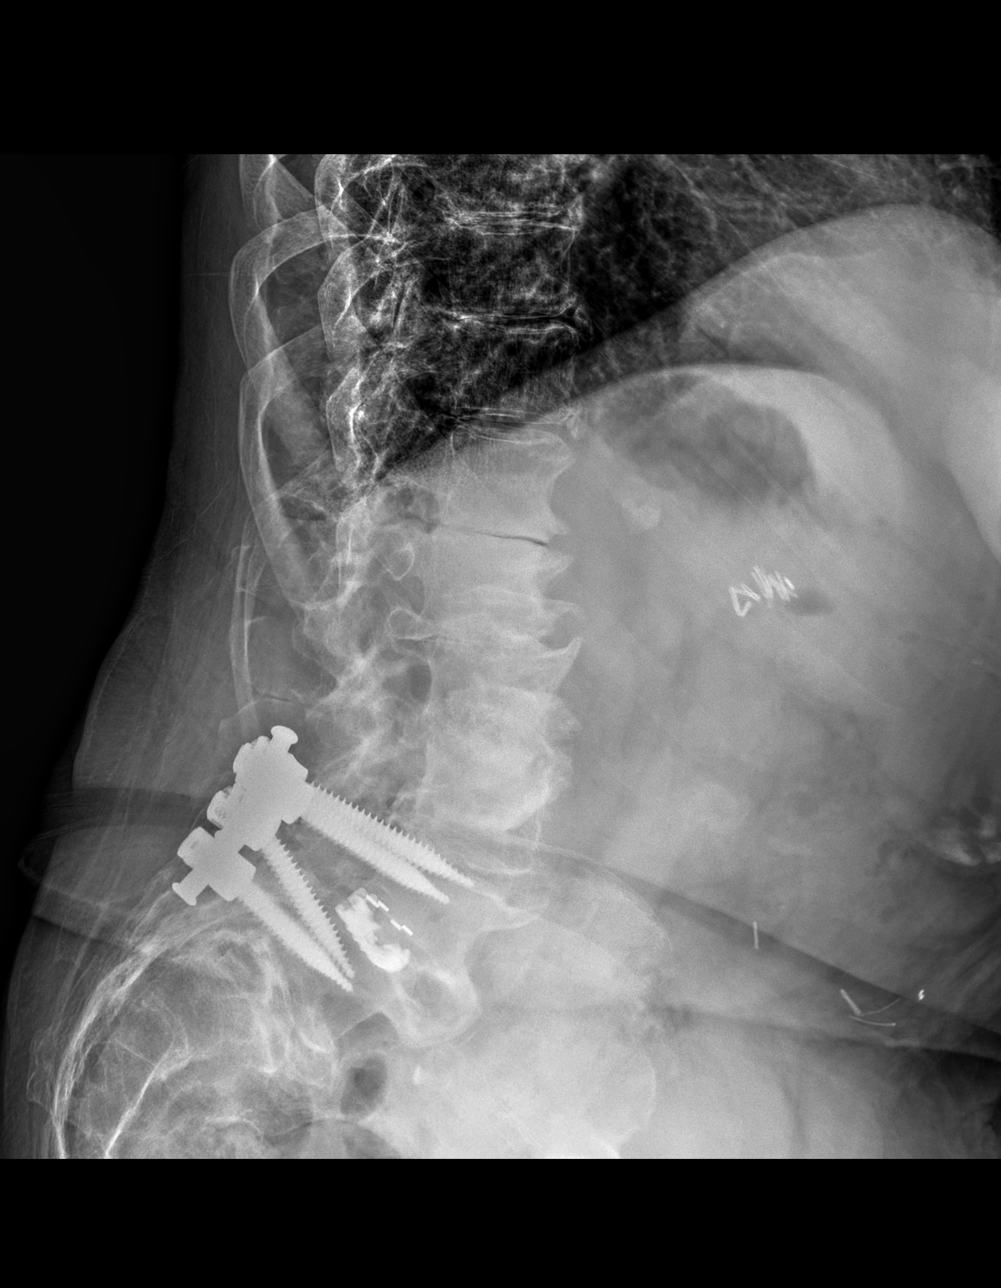

[4 of 4 positions shown; findings below may reference images not displayed]

FINDINGS: No acute fracture. No thoracic scoliosis. 21 degrees lumbar 
dextroscoliosis, apex to the right at the level of T12-L1. Multifocal 
degenerative change of the thoracolumbar spine, which is marked at T12-L4 and 
L5-S1. L4-5 pedicle screw/rod fixation and metallic intervertebral disc spacer. 
L4-5 grade 1 anterolisthesis and prominent anterior bridging osteophytes. 
Nonacute, nondisplaced left superior pubic ramus fracture and mixed sclerosis 
and lucency of the left superior pubic ramus and body. Degenerative change of 
the SI joints. Moderate left and mild right hip degenerative change.  
Left reverse delta shoulder arthroplasty. Osteopenia. Atherosclerosis. Abdominal 
and pelvic clips.
IMPRESSION: 1.  No acute fracture.  
2.  Multifocal degenerative change of the thoracolumbar spine/pelvis and 21 
degrees lumbar dextroscoliosis. 
3.  L4-5 pedicle screw/rod fixation and metallic intervertebral disc spacer. 
L4-5 grade 1 anterolisthesis and bridging osteophytes. 
4.  Nonacute, nondisplaced left superior pubic ramus fracture and mixed 
sclerosis/lucency of the left superior pubic ramus/ body.  
5.  Osteopenia: DXA may be helpful for further evaluation.

## 2022-01-31 IMAGING — DX LUMBAR SPINE AP, LAT WITH FLEXION AND EXTEN
1 series · 4 of 4 positions shown · non-contrast
Comparison: None.

________________________________________________________________________________________________ 
SCOLIOSIS SERIES 2 VIEWS, LUMBAR SPINE AP, LAT WITH FLEXION AND EXTEN,, HIP 
BILATERAL WITH PELVIS 5 VIEWS, 01/31/2022 [DATE]: 
CLINICAL INDICATION: Age-related Osteoporosis Without Current Pathological  
Fracture

[Series 1: lateral · 0.14mm/px · 4 of 4 slices shown]
[im 1/4]
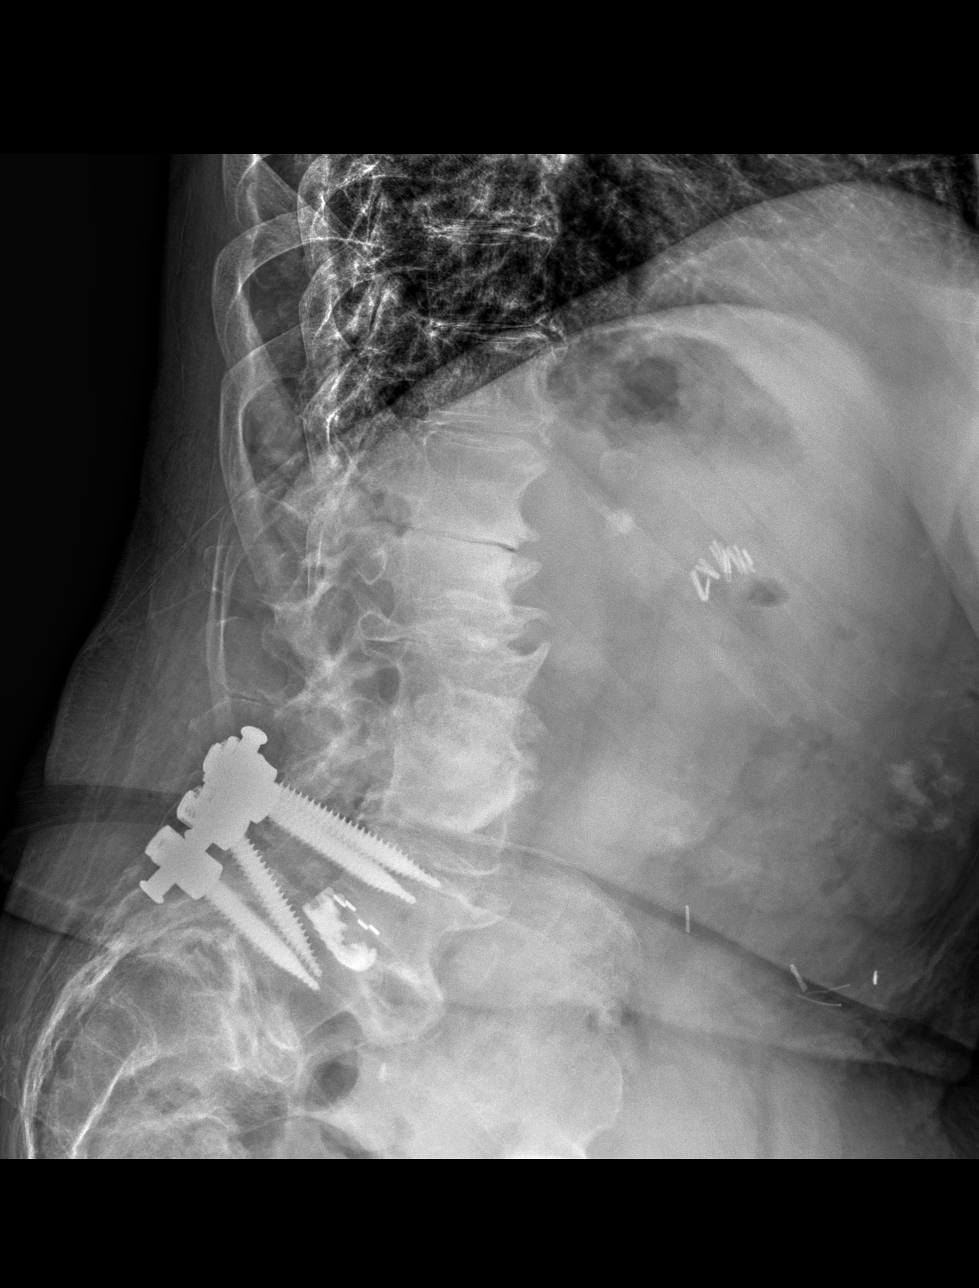
[im 2/4]
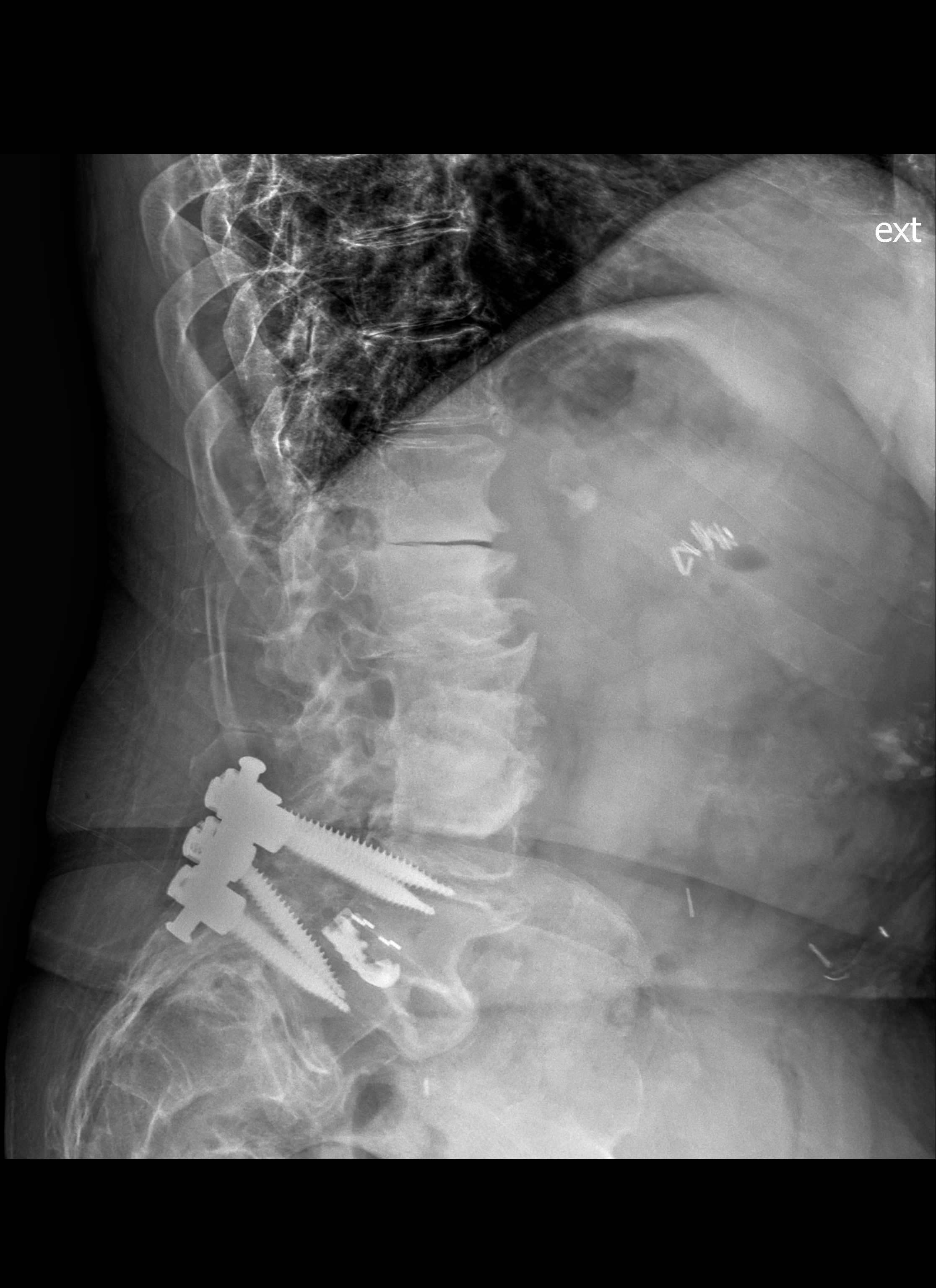
[im 3/4]
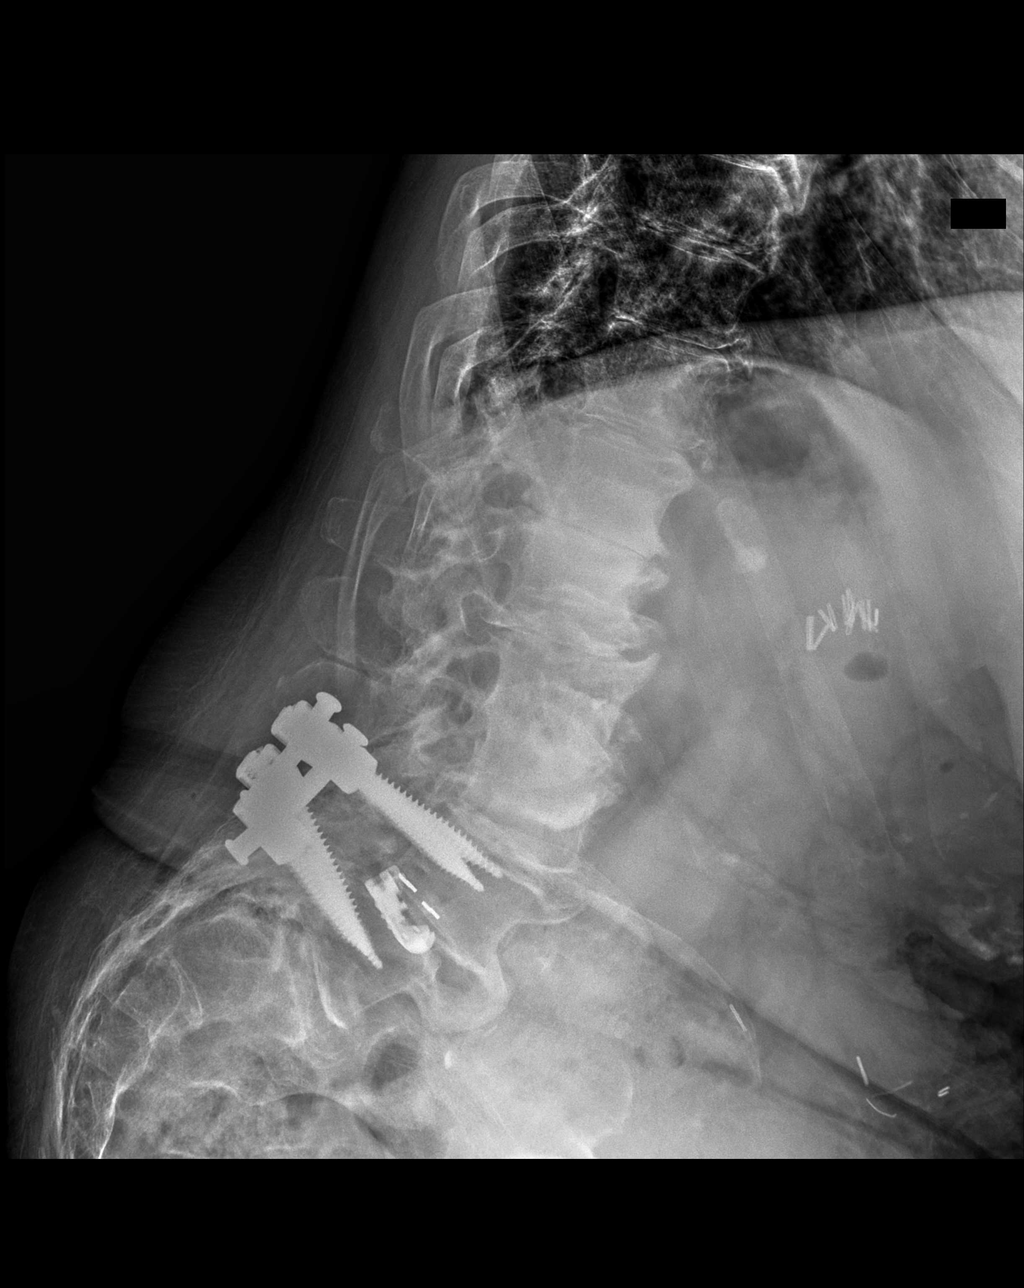
[im 4/4]
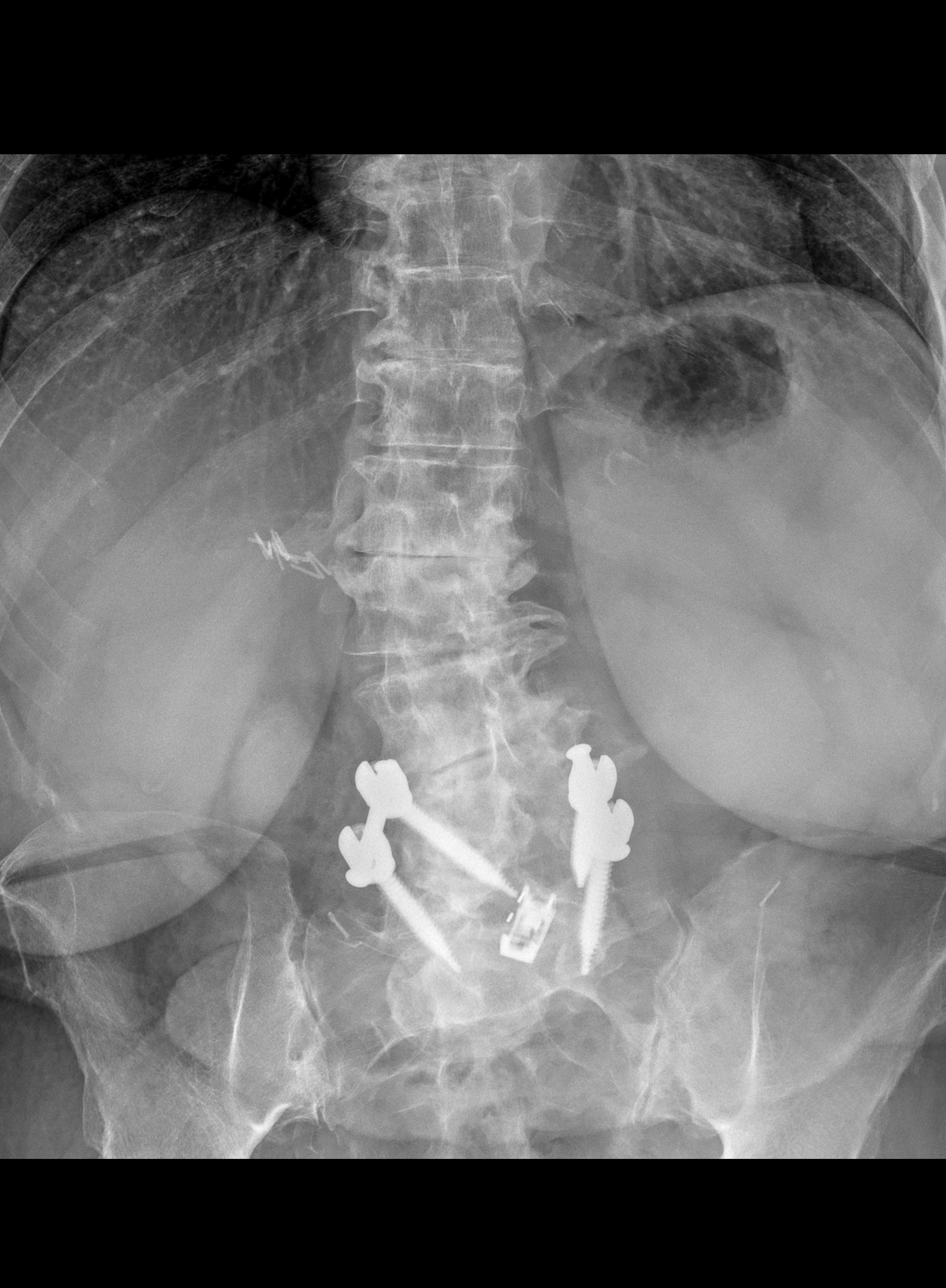

[4 of 4 positions shown; findings below may reference images not displayed]

FINDINGS: No acute fracture. No thoracic scoliosis. 21 degrees lumbar 
dextroscoliosis, apex to the right at the level of T12-L1. Multifocal 
degenerative change of the thoracolumbar spine, which is marked at T12-L4 and 
L5-S1. L4-5 pedicle screw/rod fixation and metallic intervertebral disc spacer. 
L4-5 grade 1 anterolisthesis and prominent anterior bridging osteophytes. 
Nonacute, nondisplaced left superior pubic ramus fracture and mixed sclerosis 
and lucency of the left superior pubic ramus and body. Degenerative change of 
the SI joints. Moderate left and mild right hip degenerative change.  
Left reverse delta shoulder arthroplasty. Osteopenia. Atherosclerosis. Abdominal 
and pelvic clips.
IMPRESSION: 1.  No acute fracture.  
2.  Multifocal degenerative change of the thoracolumbar spine/pelvis and 21 
degrees lumbar dextroscoliosis. 
3.  L4-5 pedicle screw/rod fixation and metallic intervertebral disc spacer. 
L4-5 grade 1 anterolisthesis and bridging osteophytes. 
4.  Nonacute, nondisplaced left superior pubic ramus fracture and mixed 
sclerosis/lucency of the left superior pubic ramus/ body.  
5.  Osteopenia: DXA may be helpful for further evaluation.

## 2022-01-31 IMAGING — DX HIP BILATERAL WITH PELVIS 5 VIEWS
2 series · 5 of 5 positions shown · non-contrast
Comparison: None.

________________________________________________________________________________________________ 
SCOLIOSIS SERIES 2 VIEWS, LUMBAR SPINE AP, LAT WITH FLEXION AND EXTEN,, HIP 
BILATERAL WITH PELVIS 5 VIEWS, 01/31/2022 [DATE]: 
CLINICAL INDICATION: Age-related Osteoporosis Without Current Pathological  
Fracture

[AP (1 of 2)]
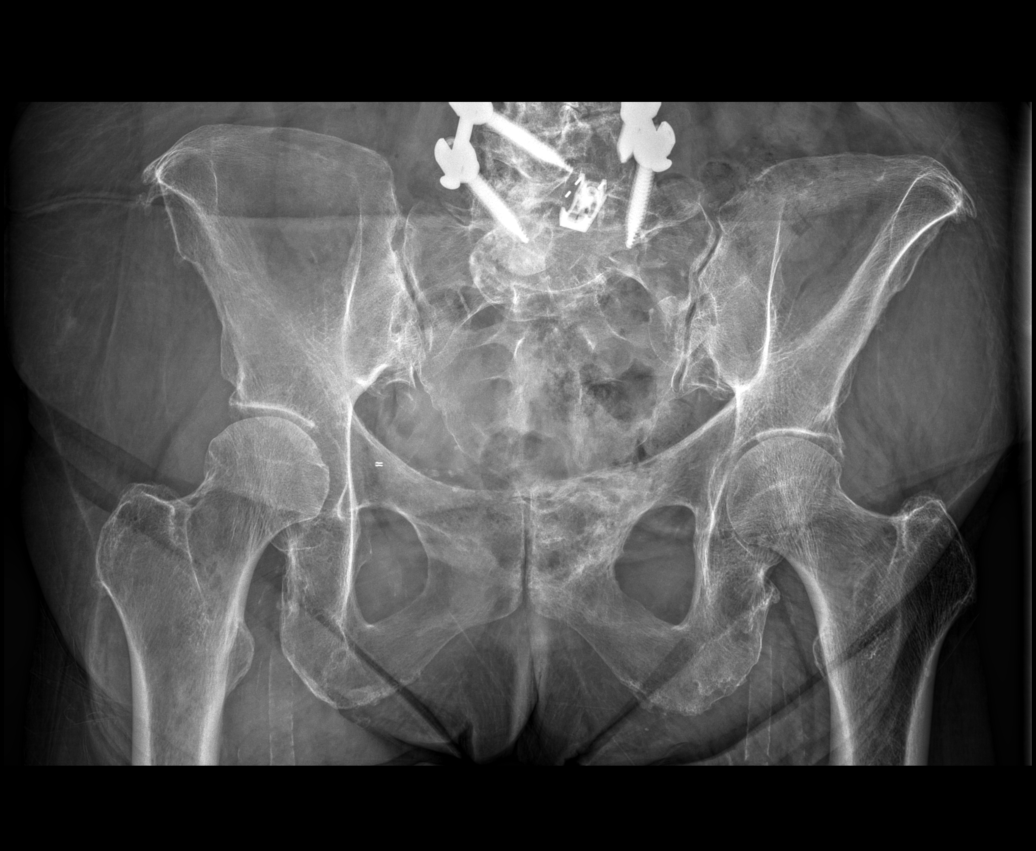

[Series 2: AP · 0.14mm/px · 4 of 4 slices shown (2 of 2)]
[im 1/4]
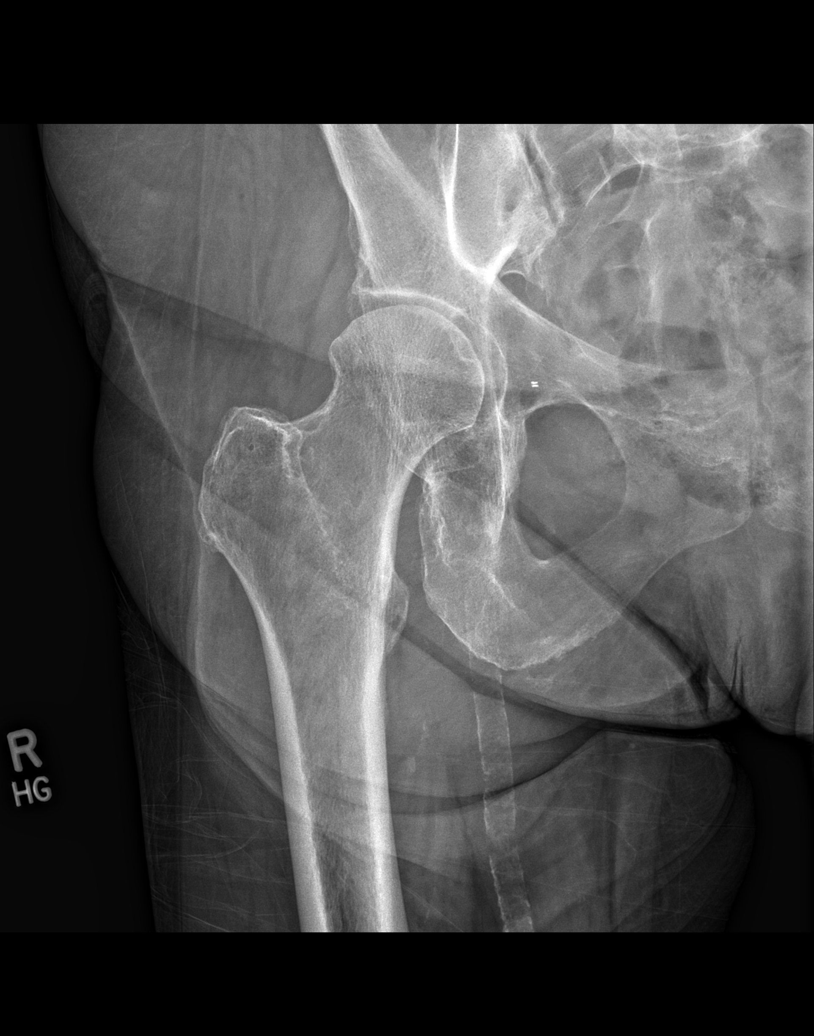
[im 2/4]
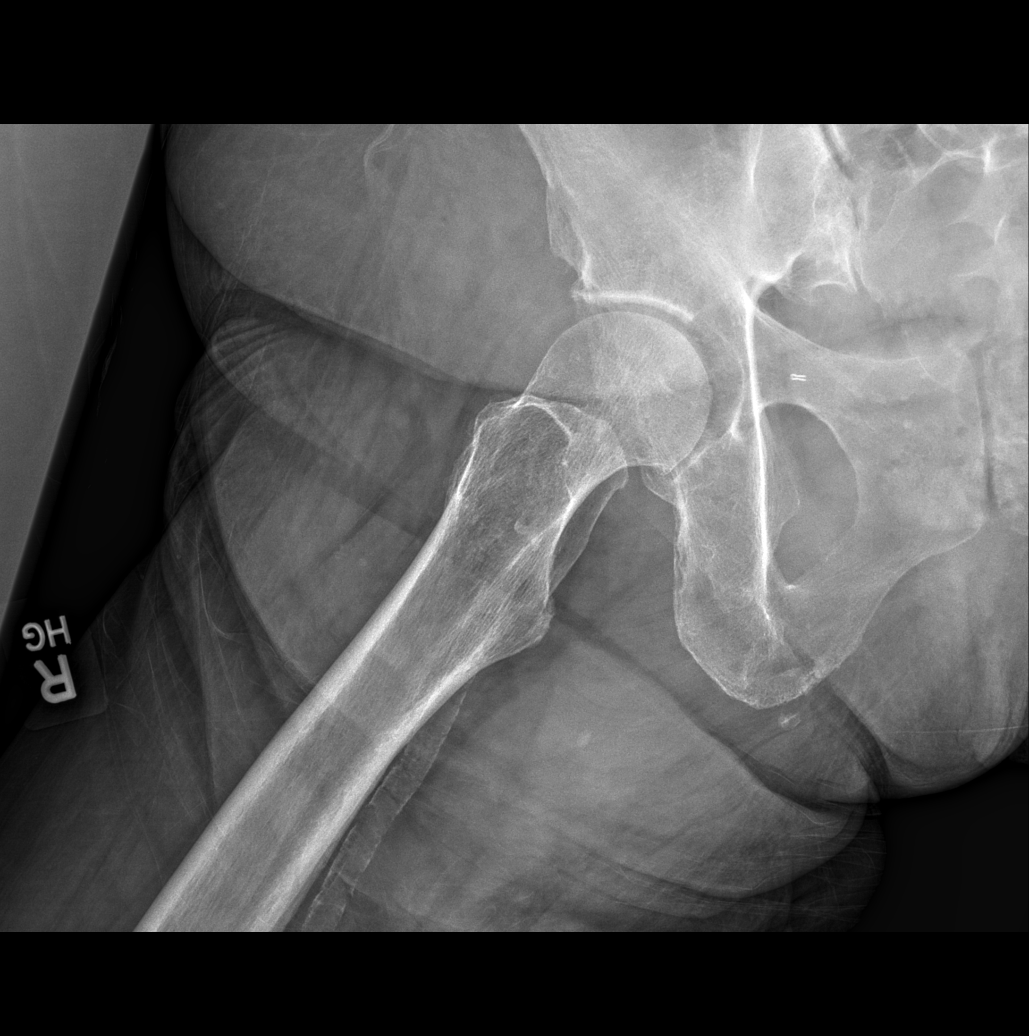
[im 3/4]
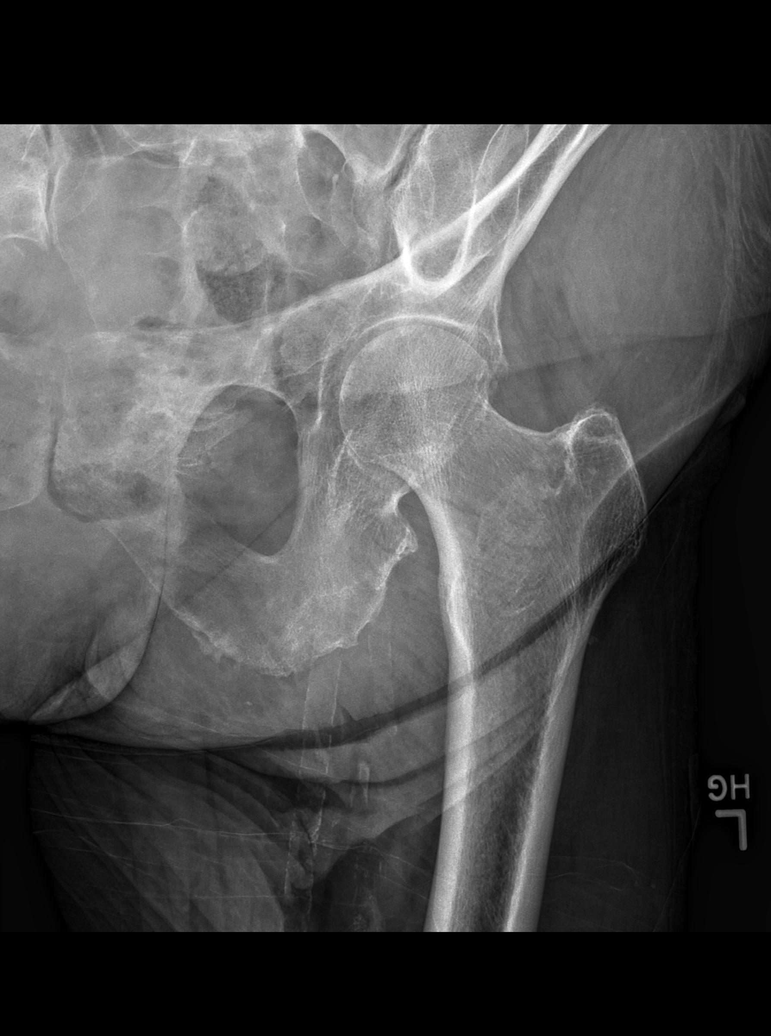
[im 4/4]
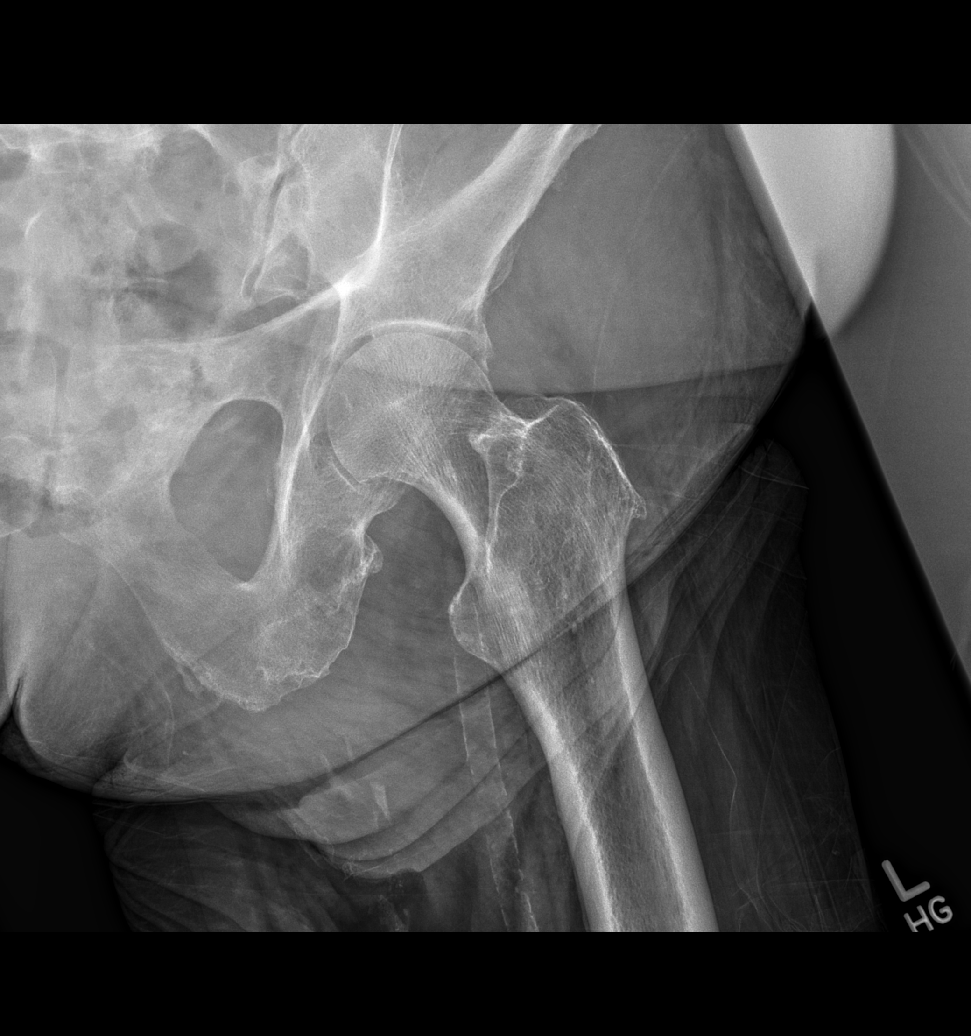

[5 of 5 positions shown; findings below may reference images not displayed]

FINDINGS: No acute fracture. No thoracic scoliosis. 21 degrees lumbar 
dextroscoliosis, apex to the right at the level of T12-L1. Multifocal 
degenerative change of the thoracolumbar spine, which is marked at T12-L4 and 
L5-S1. L4-5 pedicle screw/rod fixation and metallic intervertebral disc spacer. 
L4-5 grade 1 anterolisthesis and prominent anterior bridging osteophytes. 
Nonacute, nondisplaced left superior pubic ramus fracture and mixed sclerosis 
and lucency of the left superior pubic ramus and body. Degenerative change of 
the SI joints. Moderate left and mild right hip degenerative change.  
Left reverse delta shoulder arthroplasty. Osteopenia. Atherosclerosis. Abdominal 
and pelvic clips.
IMPRESSION: 1.  No acute fracture.  
2.  Multifocal degenerative change of the thoracolumbar spine/pelvis and 21 
degrees lumbar dextroscoliosis. 
3.  L4-5 pedicle screw/rod fixation and metallic intervertebral disc spacer. 
L4-5 grade 1 anterolisthesis and bridging osteophytes. 
4.  Nonacute, nondisplaced left superior pubic ramus fracture and mixed 
sclerosis/lucency of the left superior pubic ramus/ body.  
5.  Osteopenia: DXA may be helpful for further evaluation.

## 2022-02-17 ENCOUNTER — Ambulatory Visit: Payer: PRIVATE HEALTH INSURANCE

## 2022-02-28 NOTE — Telephone Encounter (Signed)
Pt has moved down to [place]. Pt needs her medical records, labs, xrays, and bone scan  and last 2 most recents OV notes from Dr Erasmo Leventhal  to be faxed  Dr  Genevive Bi in [place]:    Fax : [phone]  Phone #:   [phone]  Pt's call back number:  [phone].  Thank you.

## 2022-03-01 NOTE — Telephone Encounter (Signed)
Faxing.

## 2022-03-08 IMAGING — MR MRI LUMBAR SPINE WITHOUT CONTRAST
5 of 8 series · 11 of 48 positions shown · IV contrast (gadolinium)
Comparison: Lumbar radiograph January 31, 2022

________________________________________________________________________________________________ 
MRI LUMBAR SPINE WITHOUT CONTRAST, 03/08/2022 [DATE]: 
CLINICAL INDICATION: Fusion, with difficulty walking and back pain, worse on 
right
TECHNIQUE: Sagittal T1, Sagittal T2, Sagittal STIR, Axial T1 and Axial T2 MR 
images of the lumbar spine were performed without intravenous gadolinium 
enhancement.

[Series 101: survey · axial · 10.0mm · 1.25mm/px · 1 of 10 slices shown]
[im 1/10]
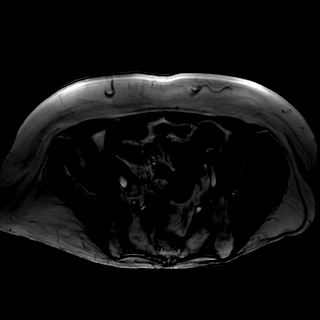

[Series 201: t2w_cor-surv · coronal · 6.0mm · 0.62mm/px · 2 of 15 slices shown]
[im 1/15]
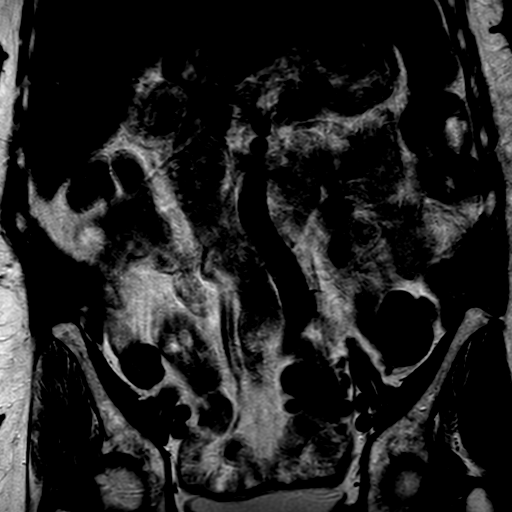
[im 15/15]
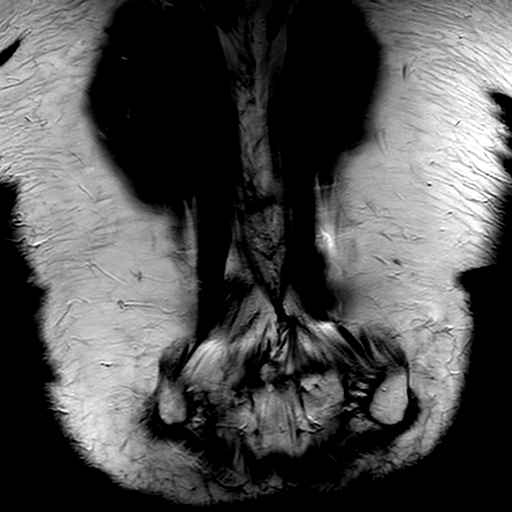

[Series 302: (id)_mdixon_tse · sagittal · 4.5mm · 0.40mm/px · 3 of 18 slices shown]
[im 1/18]
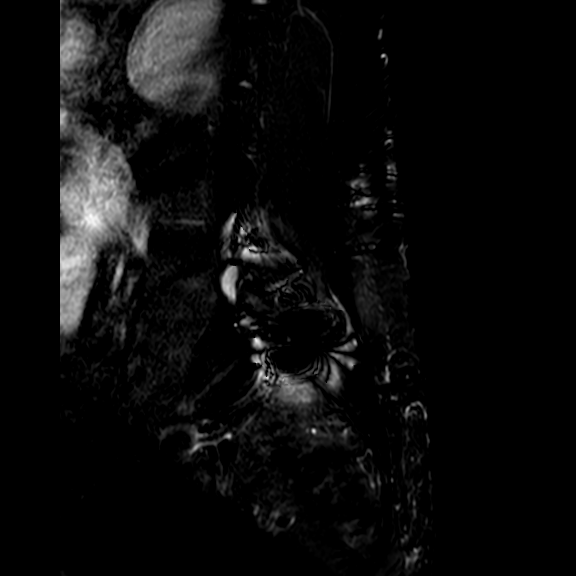
[im 9/18]
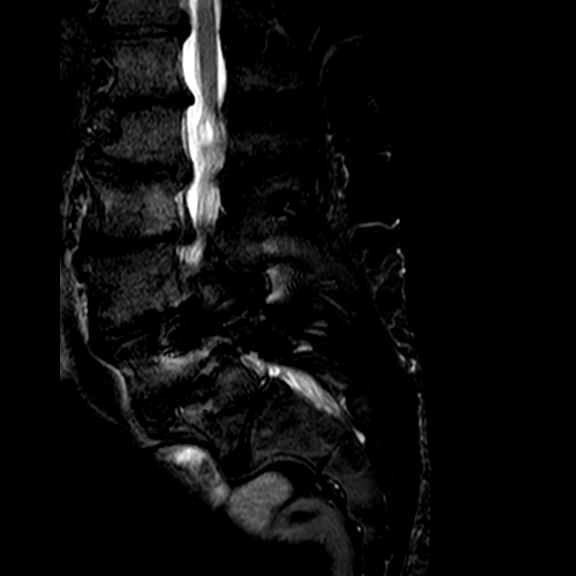
[im 18/18]
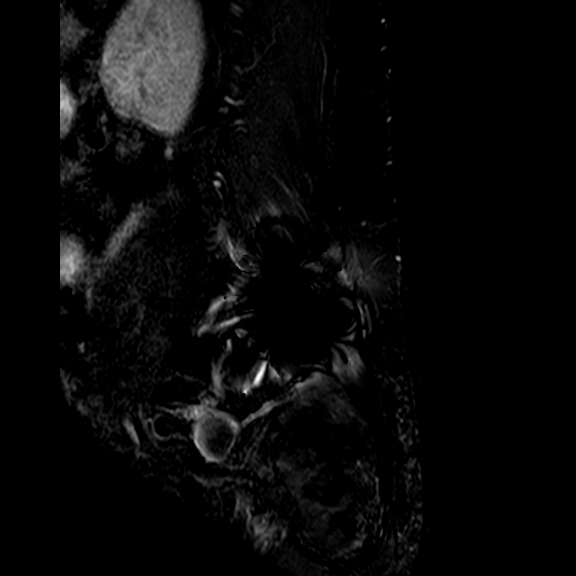

[Series 303: st2w_mdixon_tse · sagittal · 4.5mm · 0.40mm/px · 3 of 18 slices shown]
[im 1/18]
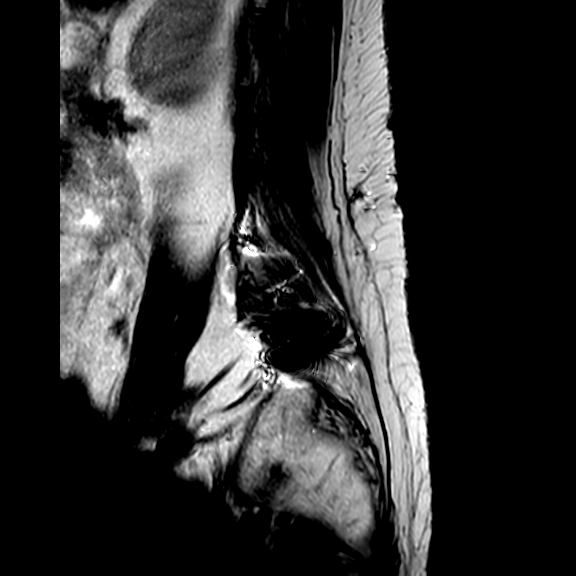
[im 9/18]
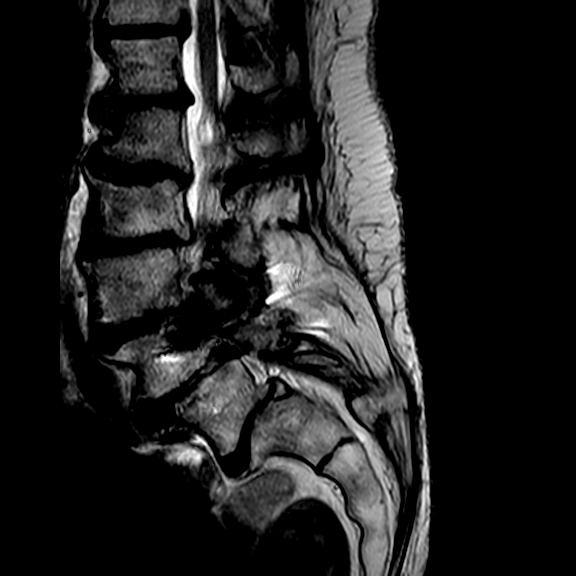
[im 18/18]
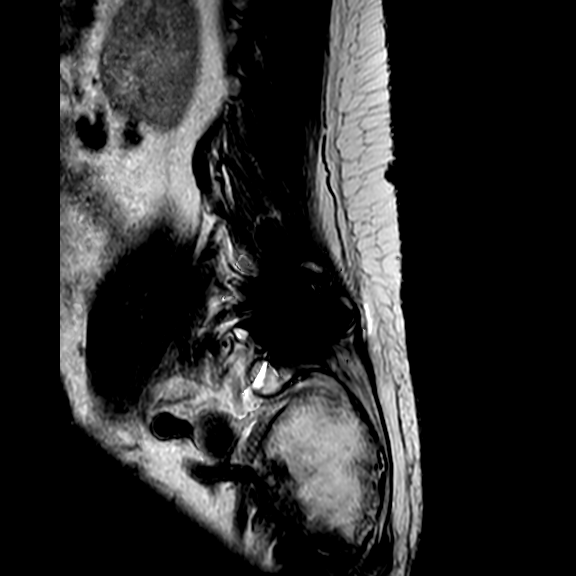

[Series 402: (id) view_ax mpr · axial · 1.0mm · 0.25mm/px · z∈[-75,-45]mm · 2 of 64 slices shown]
[im 1/64]
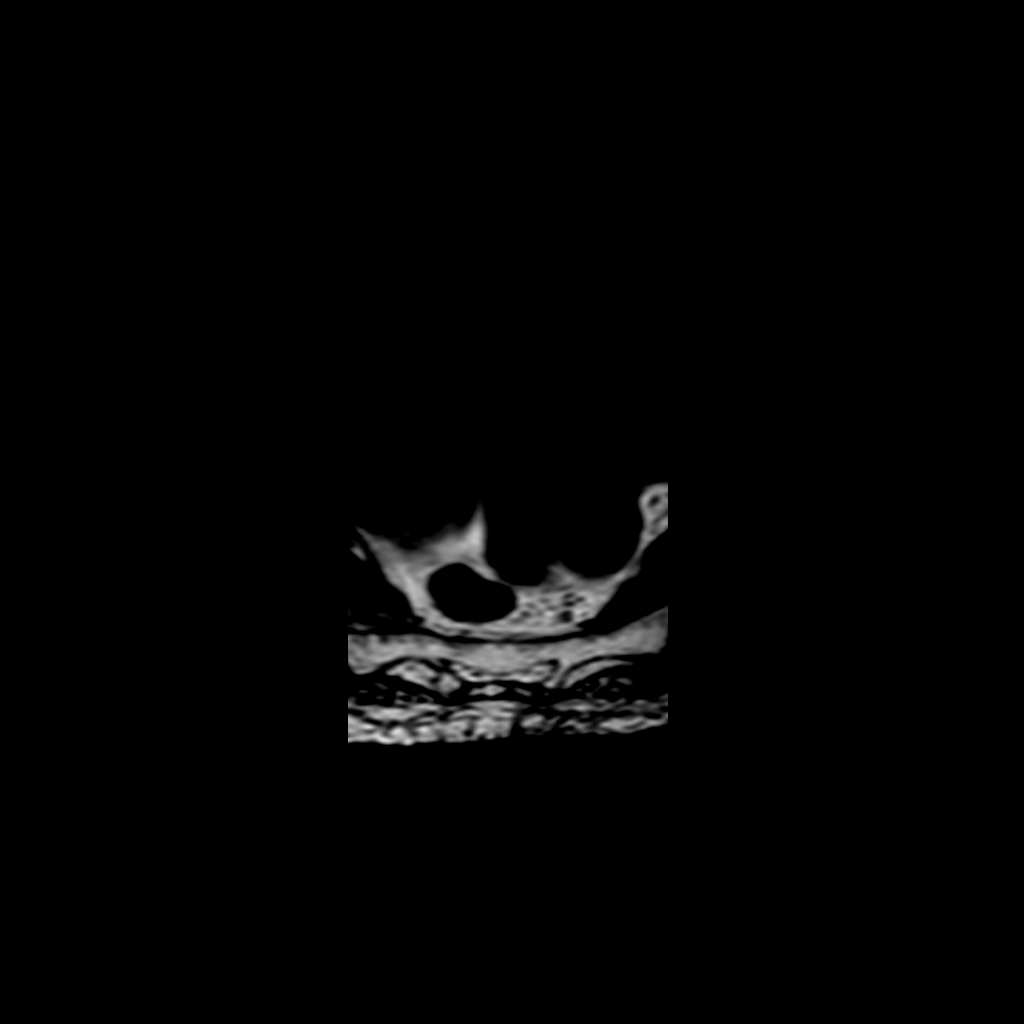
[im 13/64]
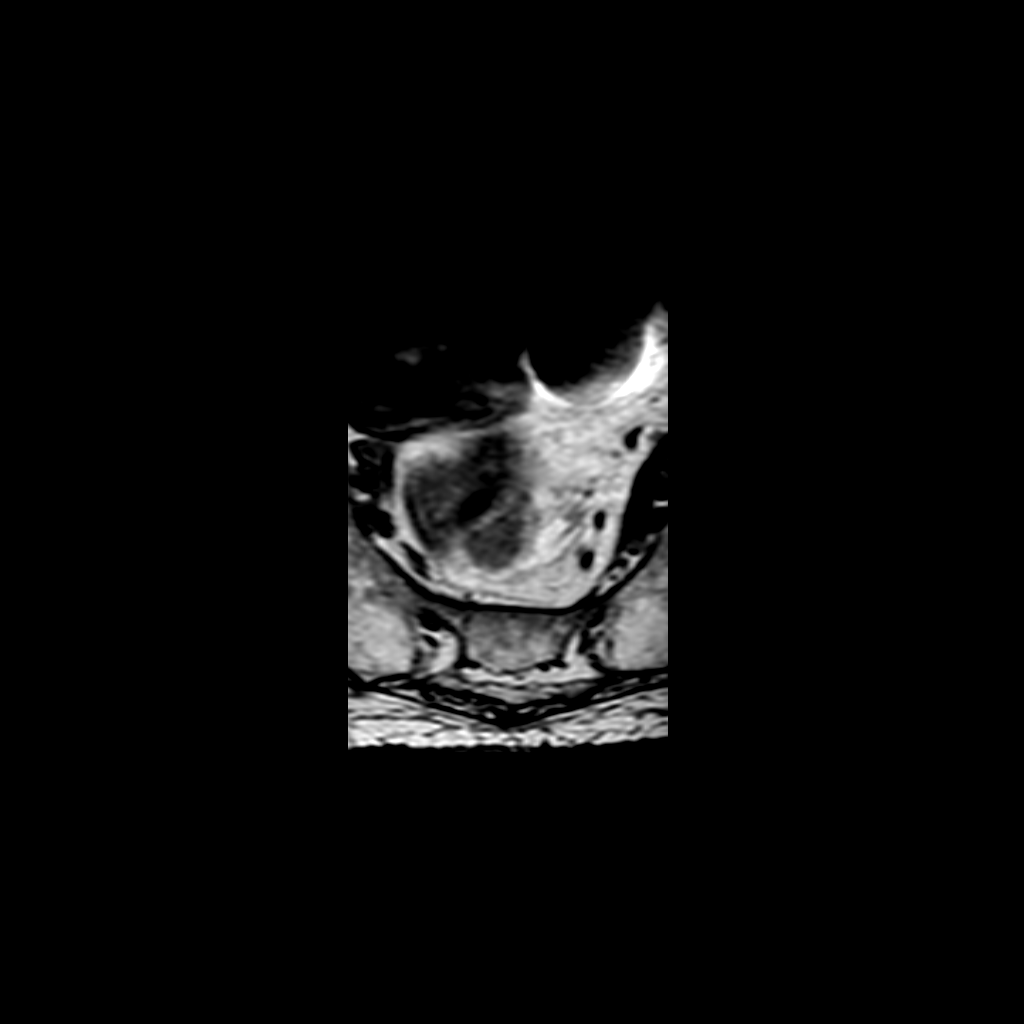

[11 of 48 positions shown; findings below may reference images not displayed]

FINDINGS: Patient is status post L4-5 pedicle screw and interbody disc cage 
fusion. There is grade 1 anterolisthesis at this level. Less than grade 1 
anterolisthesis at L5-S1. There is marked disc narrowing from T12 through S1. 
Conus terminates at T12-L1. 
There is Modic type I change at L2-3. No evidence for malignancy. There is 
moderate S-shaped lumbar scoliosis, lower curve convex to the left. 
At L5-S1 the canal is open. There is marked bilateral foraminal stenosis. 
At L4-5 there is impingement of the L5 lateral recesses, axial image 7 due to 
marked facet hypertrophy and on the right residual ligamentous thickening. There 
has been left laminectomy. There is marked right foraminal stenosis. 
At L3-4 there is mild to moderate canal stenosis and moderate right, mild left 
foraminal stenosis. Marked facet hypertrophy. 
At L2-3 there is moderate canal stenosis. There is impingement of the right L3 
lateral recess, axial T2 image 18. There is marked right, mild to moderate left 
foraminal stenosis. 
At L1-2 there is borderline-mild canal stenosis. Moderate left foraminal 
stenosis, right foramen open. 
At T12-L1 the canal is borderline narrow. Mild right foraminal narrowing.
IMPRESSION: L4-5 fusion hardware appears appropriately positioned allowing for metal 
artifact. There is stenosis of the L5 lateral recesses which appears worse on 
the right and there is marked right foraminal stenosis at this level. 
Approximately grade 1 anterolisthesis. 
Degenerative changes elsewhere as described. There is moderate canal stenosis at 
L2-3, mild to moderate canal stenosis at L3-4. There is multilevel foraminal 
stenosis. There are Modic type I changes at L2-3. 
Moderate S-shaped lumbar scoliosis. 
Moderate SI joint degenerative change.

## 2022-03-13 NOTE — Telephone Encounter (Signed)
Last visit:  07/29/21    F/u:  05/03/22    Last labs:  07/29/21    Dx:  Rheumatoid arthritis without rheumatoid factor      Patient Messages appear patient has transferred care to [place]

## 2022-03-21 IMAGING — CT CT CHEST WITHOUT CONTRAST
5 of 10 series · 17 of 36 positions shown, 18 images · non-contrast
Comparison: None

________________________________________________________________________________________________ 
CT CHEST WITHOUT CONTRAST, 03/21/2022 [DATE]: 
CLINICAL INDICATION: Scleroderma. 
A search for DICOM formatted images was conducted for prior CT imaging studies 
completed at a non-affiliated media free facility.
TECHNIQUE: The chest was scanned from base of neck through the lung bases 
without contrast on a high resolution low dose CT scanner. Routine MPR and MIP 
3D renderings were performed with concurrent physician supervision.

[Series 2: chest w/o 2.0 i31s 3 · axial · non-contrast · 0.75mm/px · z∈[-317,-125]mm · 4 of 161 slices shown, 5 images (1 of 2)]
[im 33/161  mediastinal]
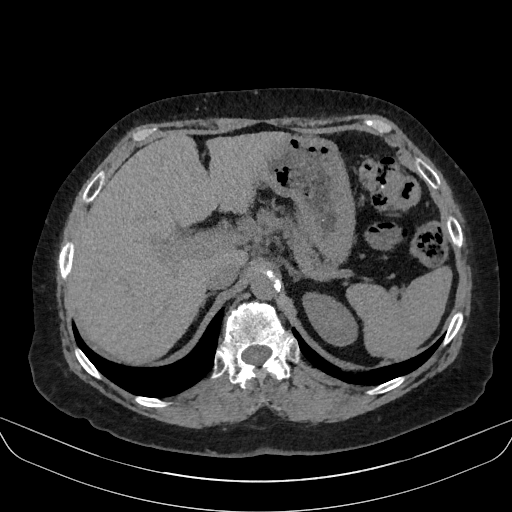
[im 33/161  lung]
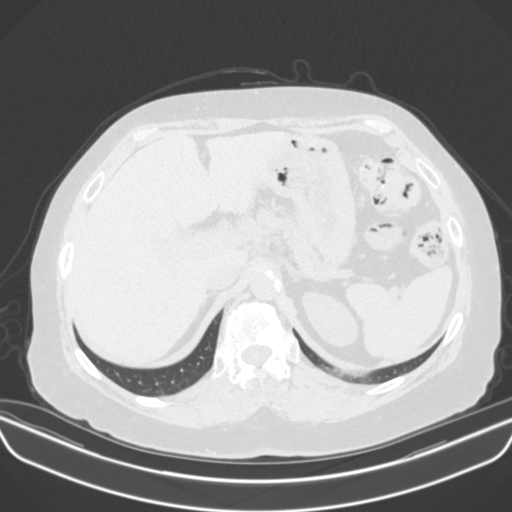
[im 65/161  lung]
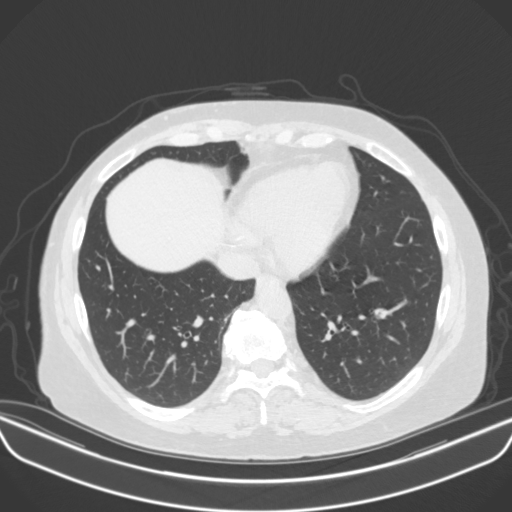
[im 97/161  lung]
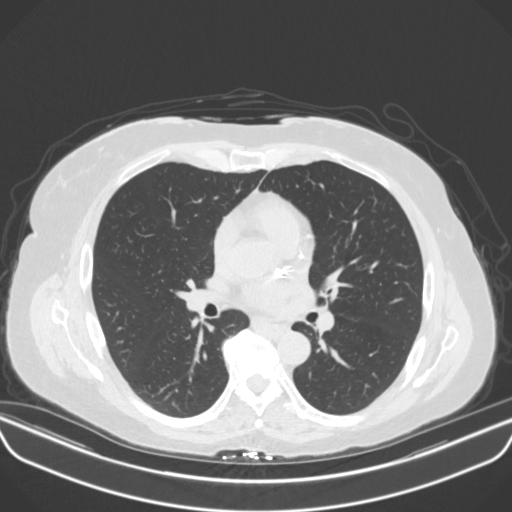
[im 129/161  lung]
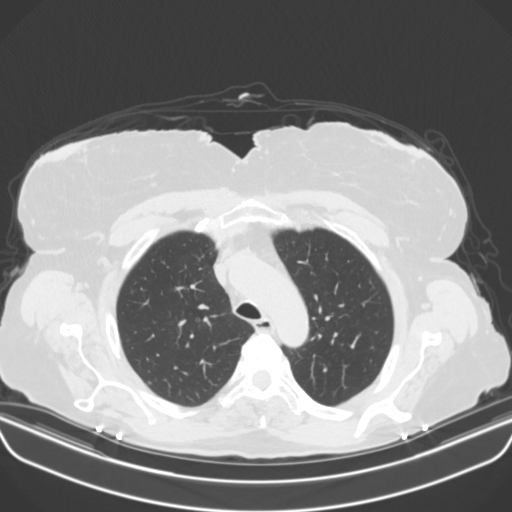

[Series 3: lung · axial · 0.75mm/px · z∈[-317,-125]mm · 4 of 161 slices shown (1 of 2)]
[im 33/161  lung]
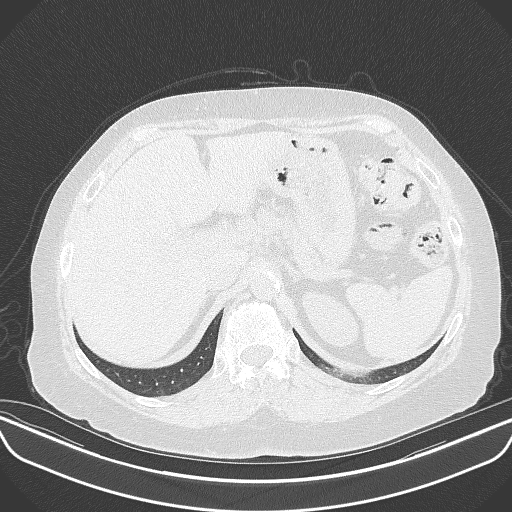
[im 65/161  lung]
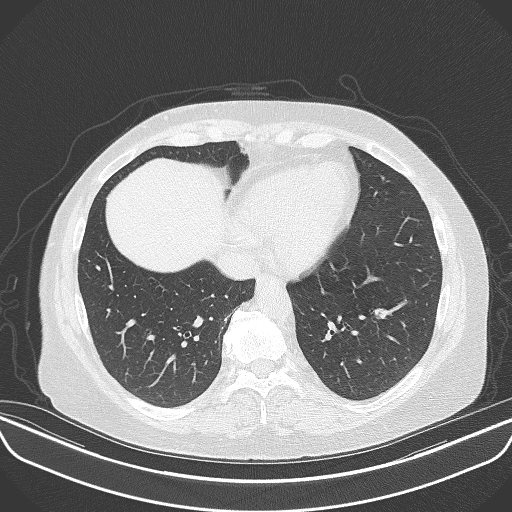
[im 97/161  lung]
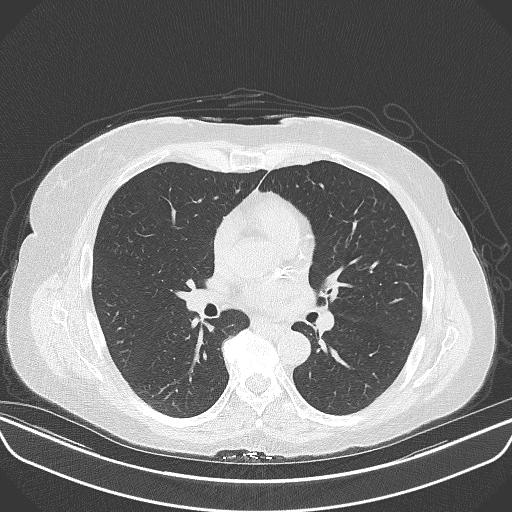
[im 129/161  lung]
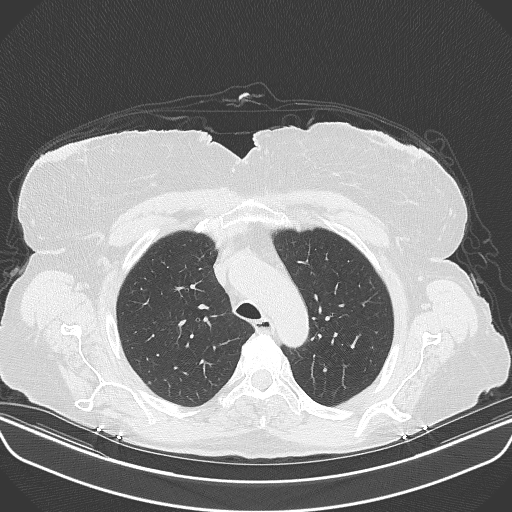

[Series 4: coronal · coronal · 0.63mm/px · 1 of 138 slices shown]
[im 69/138  lung]
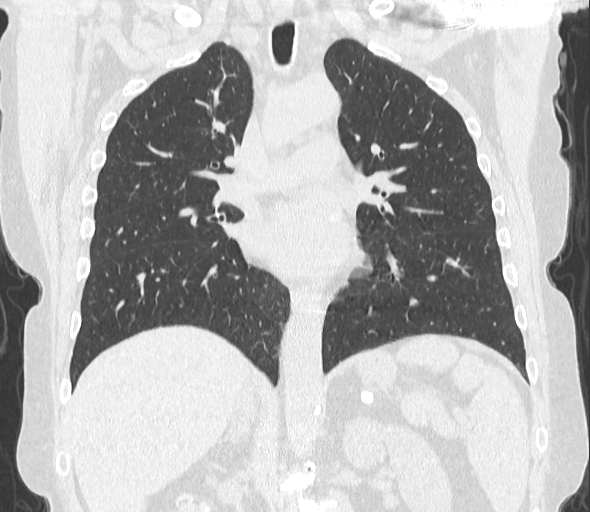

[Series 10: chest w/o 2.0 i31s 3 · axial · non-contrast · 0.72mm/px · z∈[-325,-137]mm · 4 of 158 slices shown (2 of 2)]
[im 32/158  lung]
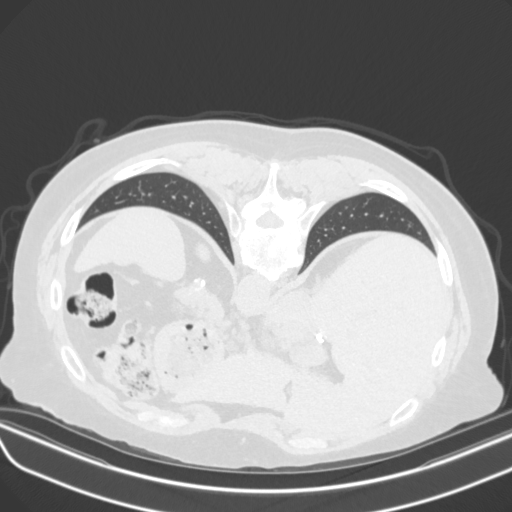
[im 63/158  lung]
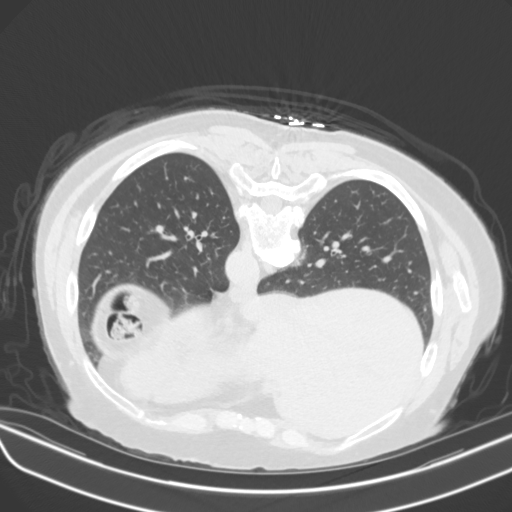
[im 95/158  lung]
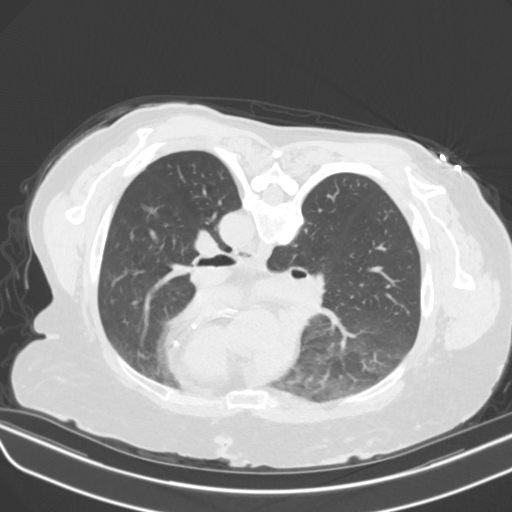
[im 126/158  lung]
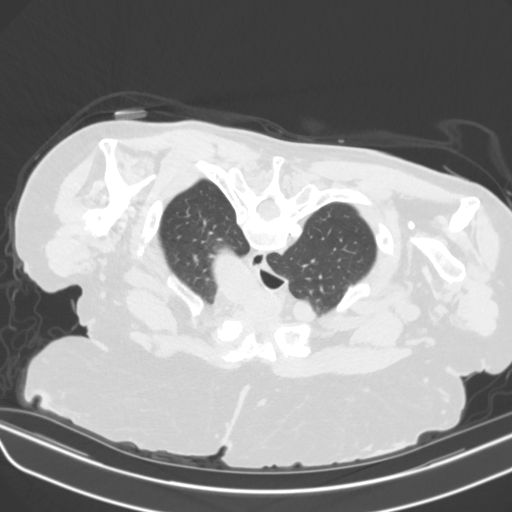

[Series 11: lung · axial · 0.72mm/px · z∈[-325,-137]mm · 4 of 158 slices shown (2 of 2)]
[im 32/158  lung]
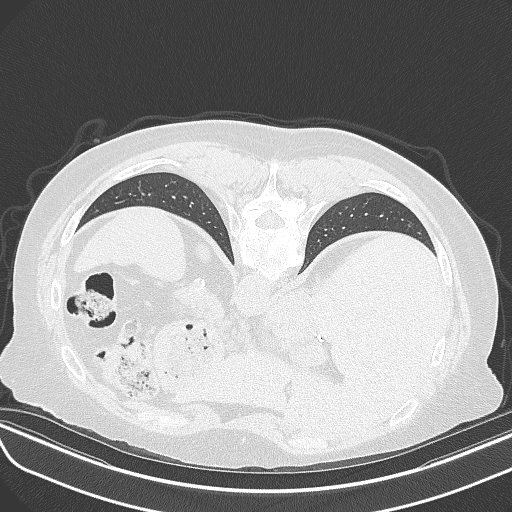
[im 63/158  lung]
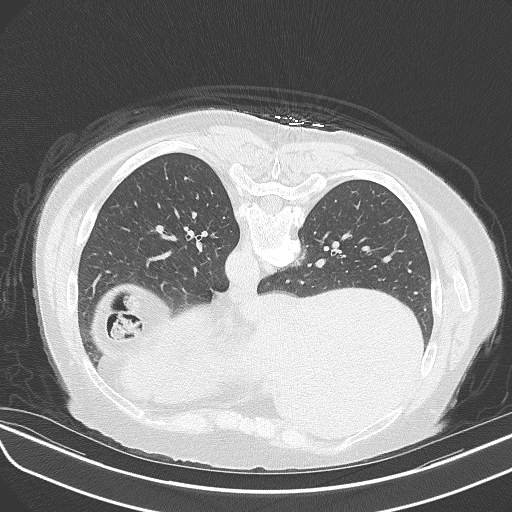
[im 95/158  lung]
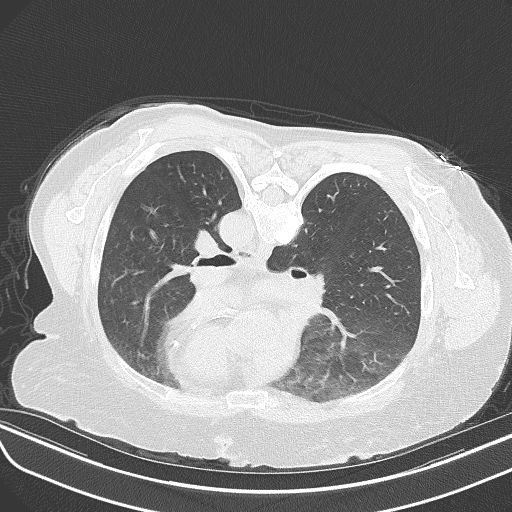
[im 126/158  lung]
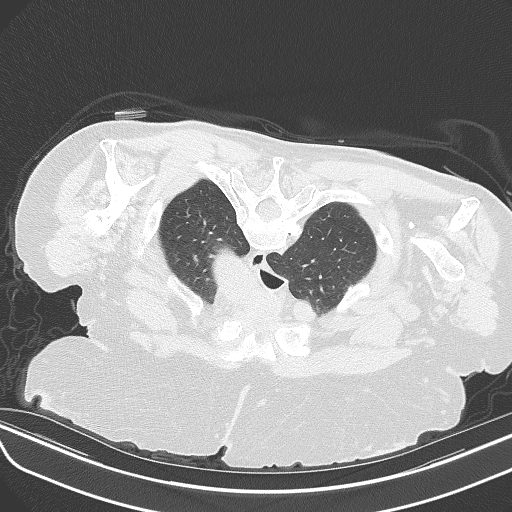

[17 of 36 positions shown; findings below may reference images not displayed]

FINDINGS: LUNGS AND PLEURA:  There is minimal interstitial lung disease best seen in the 
left lower lobe which persists on supine and prone images as seen on axial image 
#128 of series 3 and on axial image #143 of series 11. 
No other significant areas of persistent interstitial lung disease or 
infiltrates on both prone and supine images. No mass or consolidation seen 
elsewhere in the lungs and some minor dependent atelectatic changes seen in the 
upper lobes on prone images which did not persist on supine images. No 
infiltrates or consolidations or pulmonary nodules are seen in the lungs. 
MEDIASTINUM:  No adenopathy. Normal heart size. No pericardial effusion. 
Moderate coronary artery calcifications noted. 
CHEST WALL/AXILLA: No mass or adenopathy.  
UPPER ABDOMEN: The gallbladder has been removed. The esophagus and decompressed 
distally and difficult to assess. Diverticulosis seen in the colon. 
MUSCULOSKELETAL: No acute abnormality. Left shoulder prosthesis in place and 
orthopedic hardware in the right shoulder. Moderate diffuse degenerative changes 
throughout the thoracic spine.
IMPRESSION: Minimal interstitial lung disease in the left lower lobe which persists on 
supine and prone images and may be related to scleroderma. The remainder of 
lungs are otherwise clear with no other areas of significant interstitial 
involvement or infiltrates or consolidations or effusions. 
The esophagus is decompressed distally and difficult to assess. 
RADIATION DOSE REDUCTION: All CT scans are performed using radiation dose 
reduction techniques, when applicable.  Technical factors are evaluated and 
adjusted to ensure appropriate moderation of exposure.  Automated dose 
management technology is applied to adjust the radiation doses to minimize 
exposure while achieving diagnostic quality images.

## 2022-05-04 NOTE — Telephone Encounter (Signed)
Pt has transferred care to [place].

## 2022-05-10 ENCOUNTER — Ambulatory Visit: Payer: PRIVATE HEALTH INSURANCE

## 2022-05-10 DIAGNOSIS — E119 Type 2 diabetes mellitus without complications: Secondary | ICD-10-CM

## 2022-06-05 ENCOUNTER — Ambulatory Visit: Payer: PRIVATE HEALTH INSURANCE | Attending: Gastroenterology

## 2022-06-05 ENCOUNTER — Encounter

## 2022-07-17 NOTE — Telephone Encounter (Signed)
last appt 05/20/21

## 2022-10-25 NOTE — Telephone Encounter (Signed)
Pt needs an appt  Last seen in the office in 05/2021

## 2022-11-06 IMAGING — CT CT LUMBAR SPINE WITHOUT CONTRAST
3 of 5 series · 9 of 33 positions shown, 11 images · non-contrast
Comparison: 03/08/2022 MRI   
Count of known CT and Cardiac Nuclear Medicine studies performed in the previous 
12 months = 1.

________________________________________________________________________________________________ 
CT LUMBAR SPINE WITHOUT CONTRAST, 11/06/2022 [DATE]: 
CLINICAL INDICATION: Other cord compression, Eval Back Pain 

A search for DICOM formatted images was conducted for prior CT imaging studies 
completed at a non-affiliated media free facility.
TECHNIQUE: The lumbar spine was scanned from T12 through mid-sacrum without 
contrast on a high-resolution CT scanner using dose reduction techniques. 
Routine MPR reconstructions were performed.

[Series 3: axial st · axial · 0.31mm/px · z∈[-311,-221]mm · 2 of 135 slices shown, 3 images]
[im 45/135  soft-tissue]
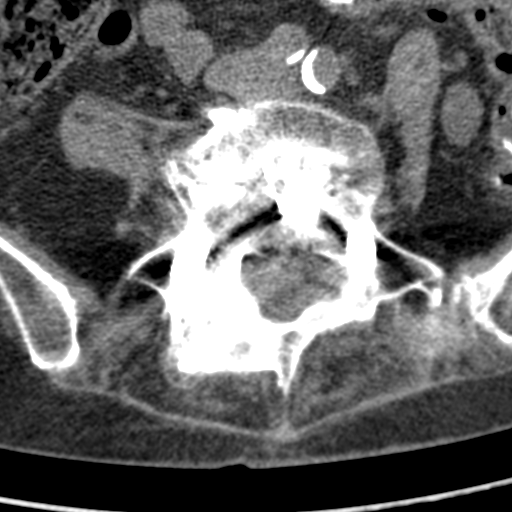
[im 45/135  bone]
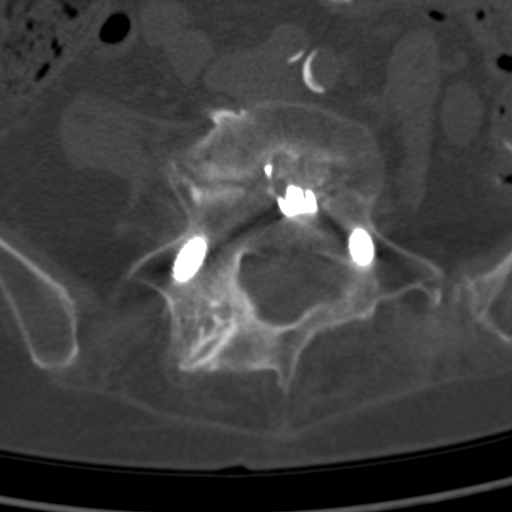
[im 90/135  bone]
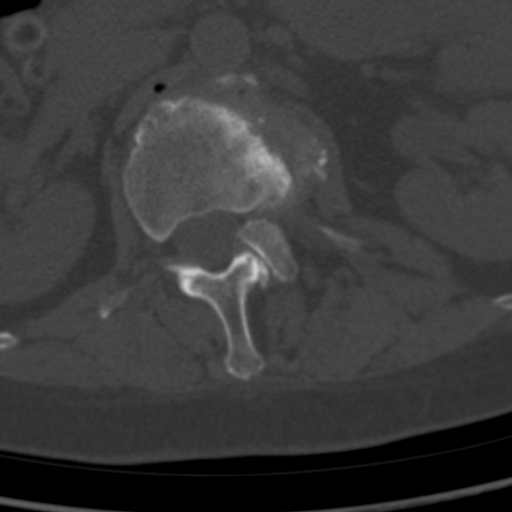

[Series 8: sag st · sagittal · 0.29mm/px · 5 of 81 slices shown, 6 images]
[im 27/81  bone]
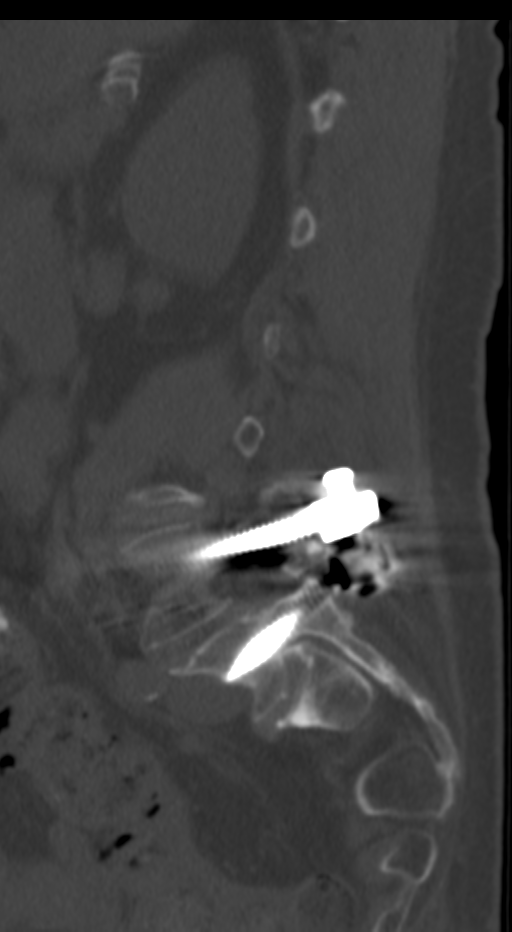
[im 34/81  bone]
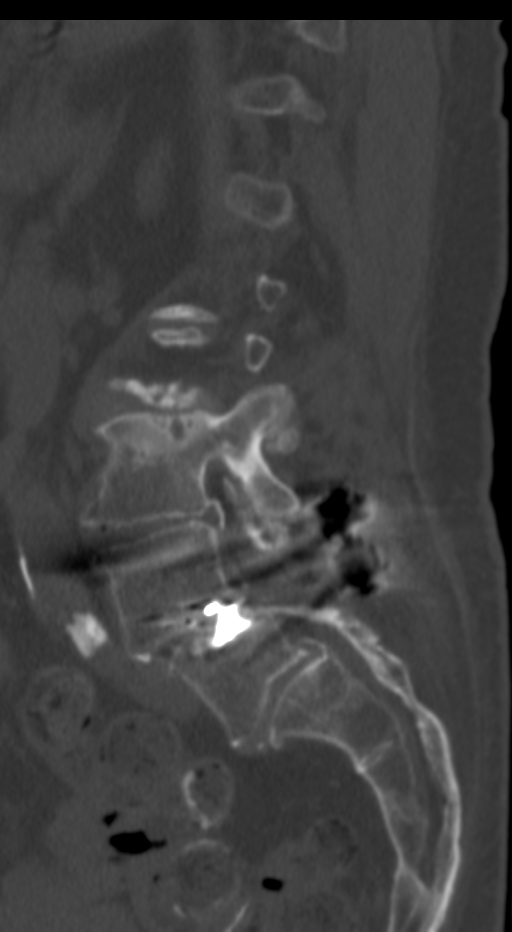
[im 41/81  soft-tissue]
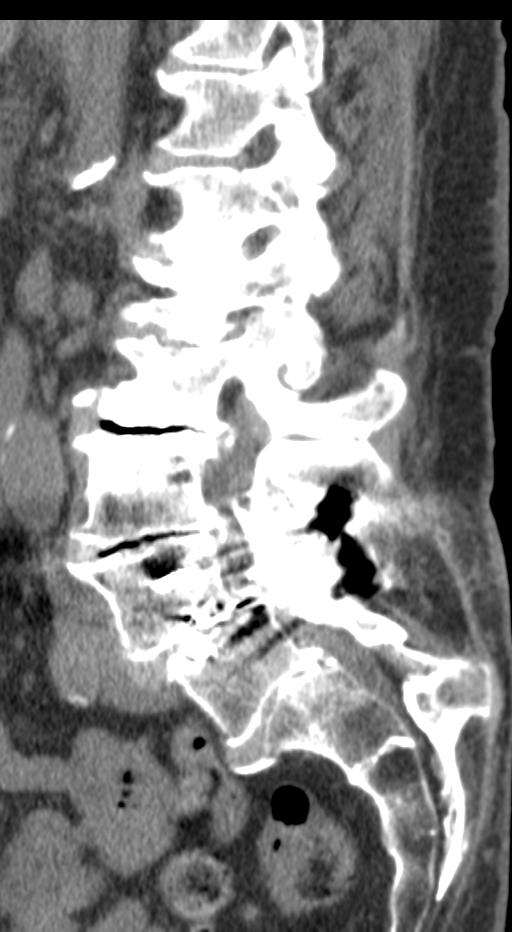
[im 41/81  bone]
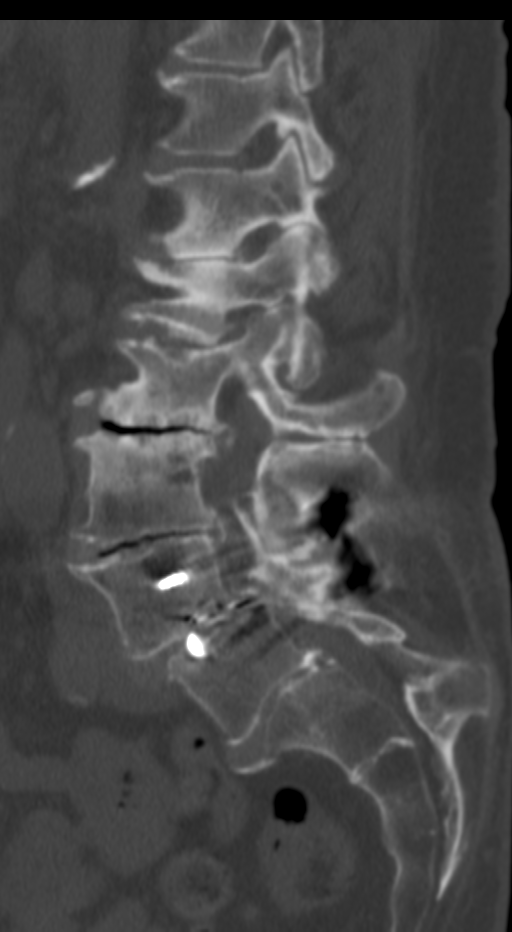
[im 47/81  bone]
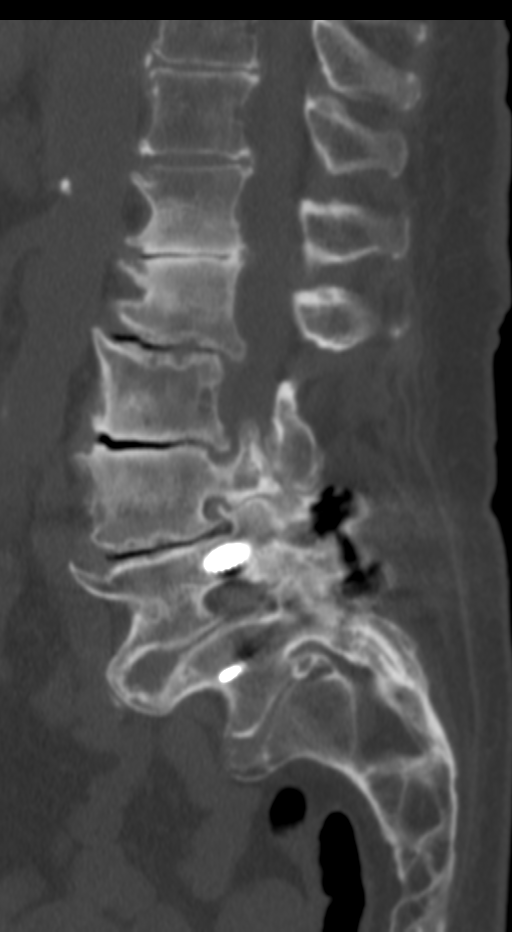
[im 54/81  bone]
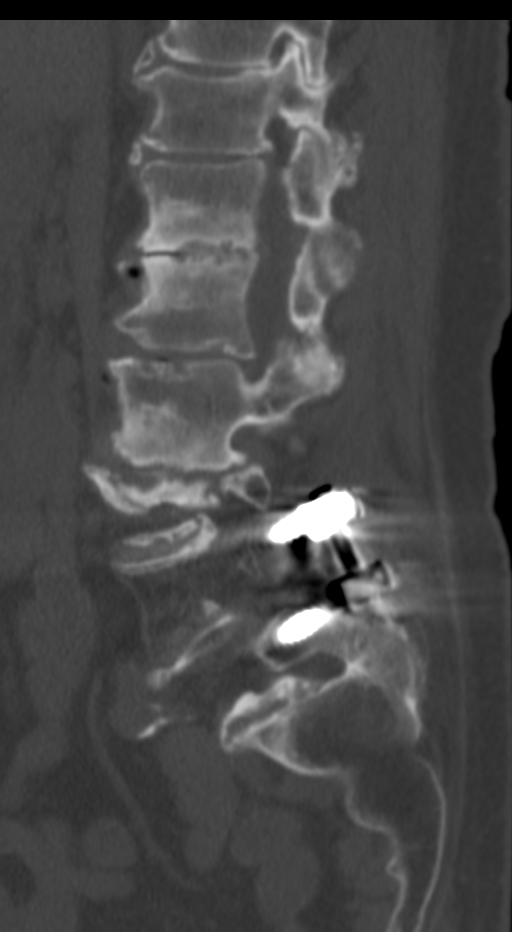

[Series 9: cor st · coronal · 0.30mm/px · 2 of 66 slices shown]
[im 22/66  bone]
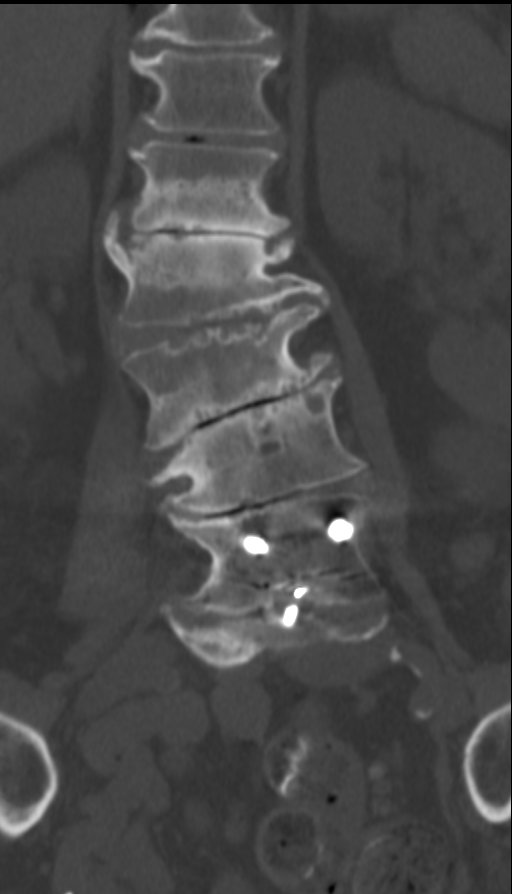
[im 44/66  bone]
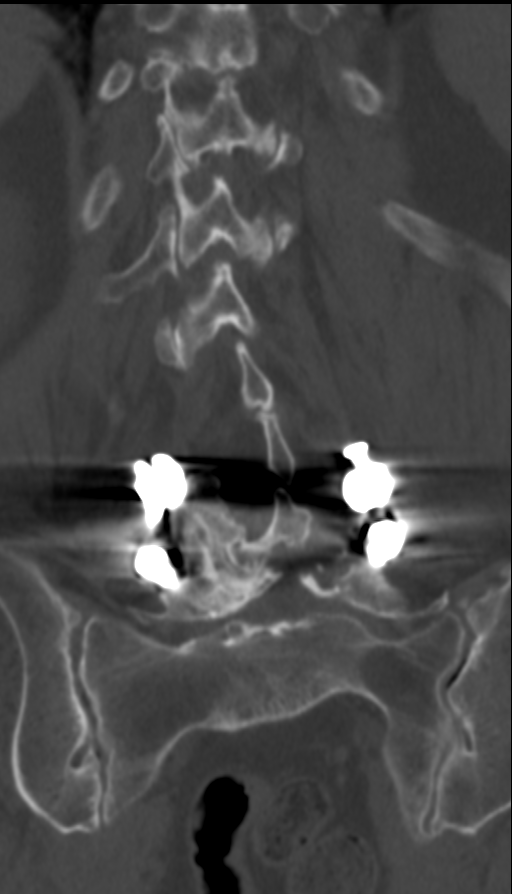

[9 of 33 positions shown; findings below may reference images not displayed]

FINDINGS: L4-5 pedicle screw/rod fixation, metallic intervertebral disc spacer, 
left laminectomy and facetectomy. No hardware loosening. Multilevel marginal 
osteophytes. No vertebral body fracture. 0.2 cm posterior positioning of L3 upon 
L4. 0.6 cm anterolisthesis at L4-5. 0.3 cm anterolisthesis at L5-S1. 17 degrees 
dextroscoliosis. No osteolytic or osteosclerotic lesion. Postsurgical change of 
the posterior paraspinal soft tissues. Degenerative change of the SI joints. 
Osteopenia. The aorta is normal in caliber. Abdominal clips. Colonic 
diverticulosis. Atherosclerosis. 
Disc levels are as follows given the intrinsic resolution of the CT exam: 
T12-L1: Osteophyte-disc complex measures 0.4 cm in AP dimensions centrally. 
Marked asymmetric disc space narrowing. Mild central canal/lateral recess 
stenosis. Mild left neural foraminal stenosis.  
L1-L2: Osteophyte-disc complex measures 0.5 cm in AP dimensions centrally. 
Marked asymmetric disc space narrowing. Mild facet arthropathy. Mild central 
canal/lateral recess stenosis. Marked left and mild right neural foraminal 
stenosis with encroachment on the left L1 nerve root.  
L2-L3: Osteophyte-disc complex measures 0.7 cm in AP dimensions centrally. 
Marked asymmetric disc space narrowing.  Marked facet arthropathy. Marked 
central canal stenosis (thecal sac measures 0.5 cm in AP dimensions) and lateral 
recess stenosis, with encroachment on the L3 nerve roots. Marked neural 
foraminal stenosis with encroachment on the L2 nerve roots.  
L3-L4: 0.2 cm posterior positioning of L3 upon L4. Marked asymmetric disc space 
narrowing and marginal osteophytes. Osteophyte-disc complex measures 0.6 cm in 
AP dimensions. Moderate facet arthropathy. Marked central canal stenosis (thecal 
sac measures 0.5 cm in AP dimensions) and lateral recess stenosis, with 
encroachment on the L4 nerve roots. Marked right and moderate left neural 
foraminal stenosis with encroachment on the right L3 nerve root.  
L4-L5: L4-5 pedicle screw/rod fixation, metallic intervertebral disc spacer, 
left laminectomy and facetectomy. Marked asymmetric narrowing of the underlying 
disc space. No interbody fusion. Ankylosis of the degenerative right facet 
joint. 0.6 cm anterolisthesis. No discrete disc herniation. No spinal canal or 
neural foraminal stenosis.  
L5-S1: 0.3 cm anterolisthesis. Moderate disc space narrowing and partial disc 
calcification. No discrete disc herniation. Moderate right and mild left facet 
arthropathy. Mild central canal/lateral recess stenosis. Marked neural foraminal 
stenosis, with encroachment on the L5 nerve roots.
IMPRESSION: 1.  Marked degenerative change, listheses, spinal stenosis with neural foraminal 
encroachment and mild scoliosis. 
2.  Central canal stenosis is marked at L2-3/L3-4 and mild at T12-L1, 
L1-2/L5-S1. Variable multilevel neural foraminal stenosis with encroachment on 
the L3 and L4 nerve roots. 
3.  Lateral recess stenosis is marked on the left at L1-2, right L3-4 and 
bilaterally at L2-3/L5-S1 with encroachment on the exiting nerve roots. Moderate 
left L3-4 and mild additional multilevel neural foraminal stenosis. 
4.  L4-5 pedicle screw/rod fixation, metallic intervertebral disc spacer, left 
laminectomy and facetectomy. No hardware loosening.  
5.  Osteopenia: DXA with TBS (trabecular bone score) may be helpful for further 
evaluation. 
RADIATION DOSE REDUCTION: All CT scans are performed using radiation dose 
reduction techniques, when applicable.  Technical factors are evaluated and 
adjusted to ensure appropriate moderation of exposure.  Automated dose 
management technology is applied to adjust the radiation doses to minimize 
exposure while achieving diagnostic quality images.

## 2022-11-07 IMAGING — MR MRI THORACIC SPINE WITHOUT CONTRAST
4 of 9 series · 17 of 48 positions shown · IV contrast (gadolinium)
Comparison: CT examination of the chest of 03/21/2022.

________________________________________________________________________________________________ 
MRI THORACIC SPINE WITHOUT CONTRAST, 11/07/2022 [DATE]: 
CLINICAL INDICATION: Patient had hip injection Gafsi Gafsi with the subsequent day 
inability to walk without help. Pain across low back into left hip and right 
thigh. Ataxic gait. Evaluate for cord compression.
TECHNIQUE: Multiplanar, multiecho position MR images of the thoracic spine were 
performed without intravenous gadolinium enhancement. Patient was scanned on a 
1.5T magnet.

[Series 201: t2_sag_count · sagittal · 4.0mm · 0.62mm/px · 2 of 22 slices shown]
[im 1/22]
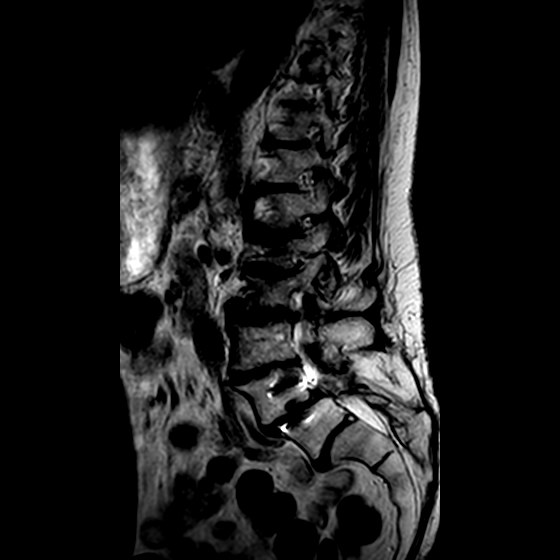
[im 22/22]
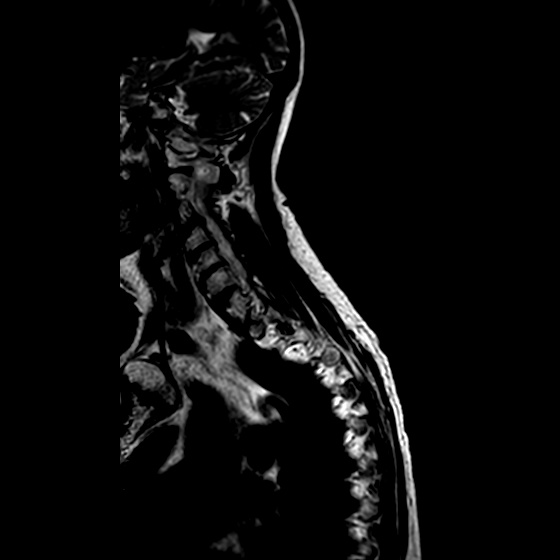

[Series 202: T2 · sagittal · 4.0mm · 0.62mm/px · 2 of 11 slices shown (1 of 2)]
[im 1/11]
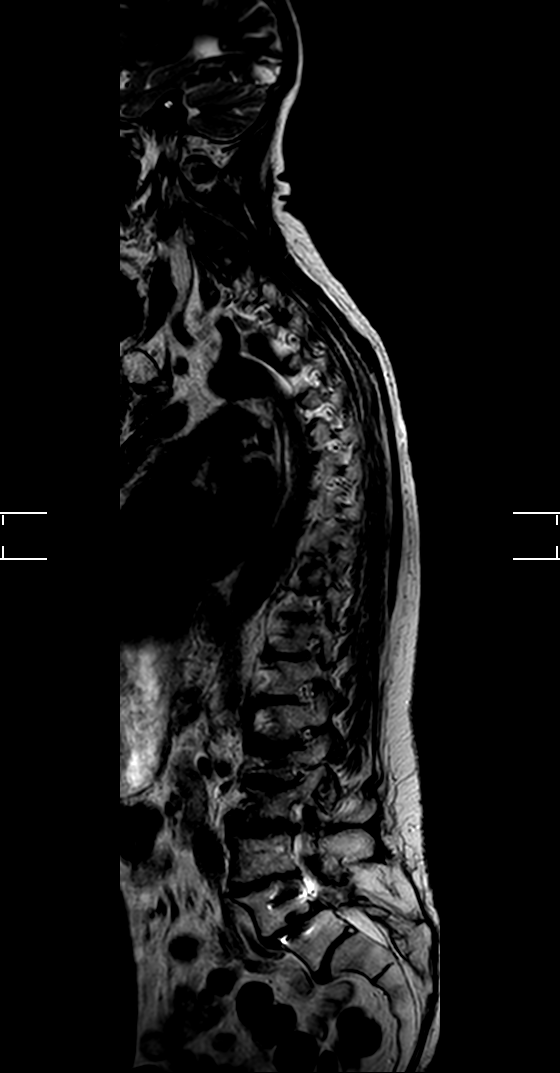
[im 11/11]
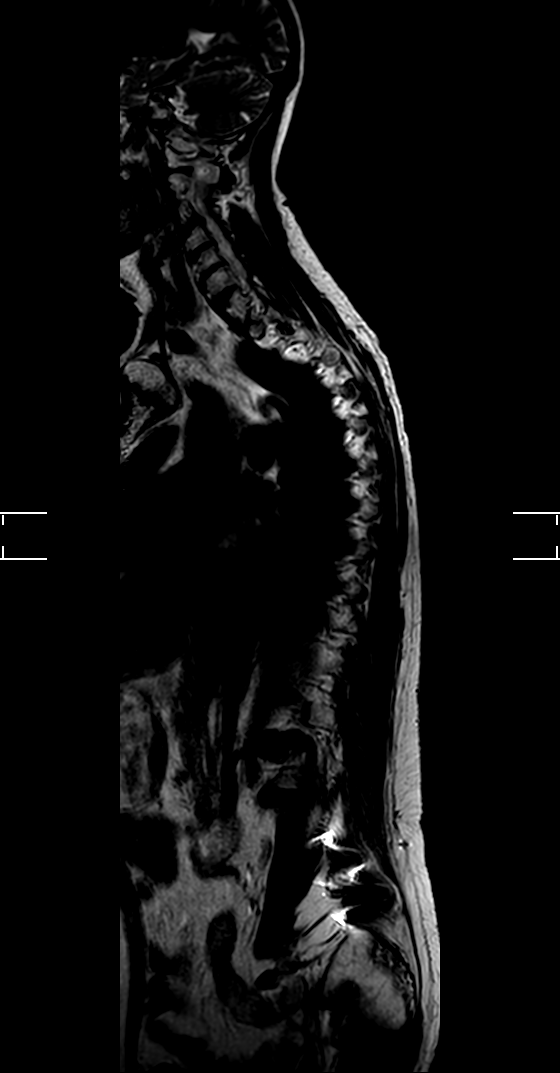

[Series 401: T1 · sagittal · 3.0mm · 0.53mm/px · 3 of 21 slices shown]
[im 1/21]
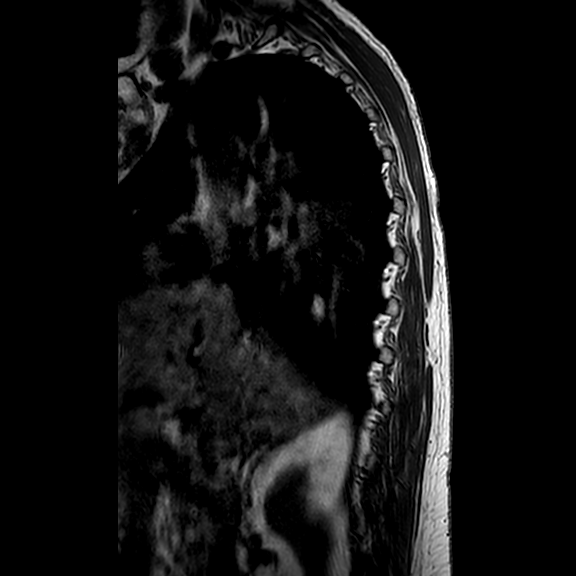
[im 11/21]
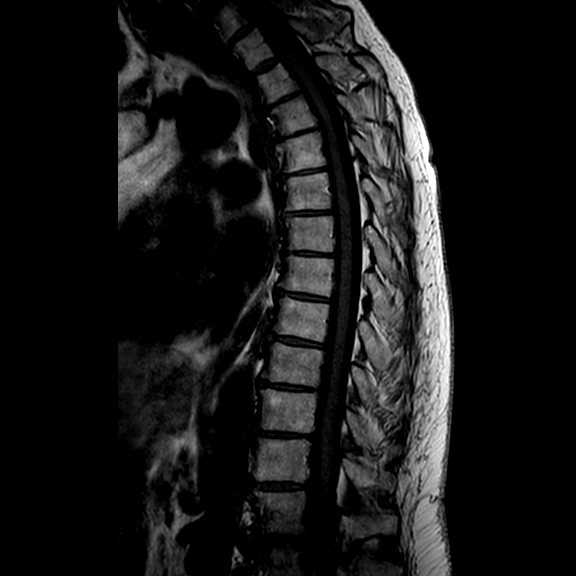
[im 21/21]
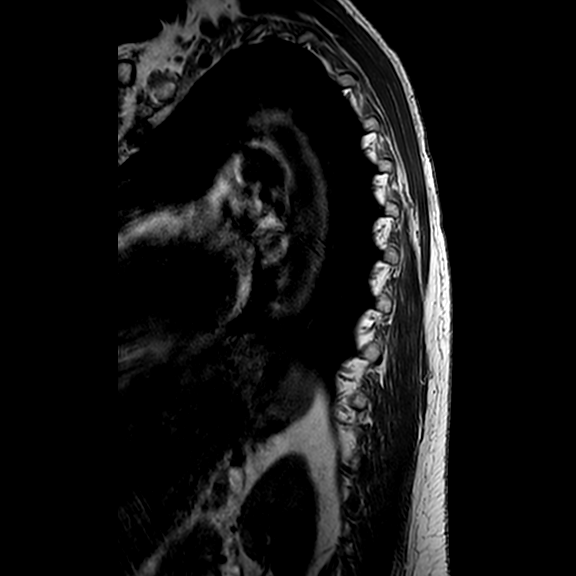

[Series 701: T2 · axial · 4.0mm · 0.42mm/px · z∈[+4,+241]mm · 10 of 75 slices shown (2 of 2)]
[im 1/75]
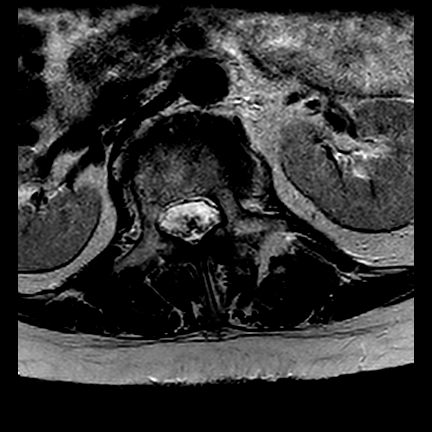
[im 8/75]
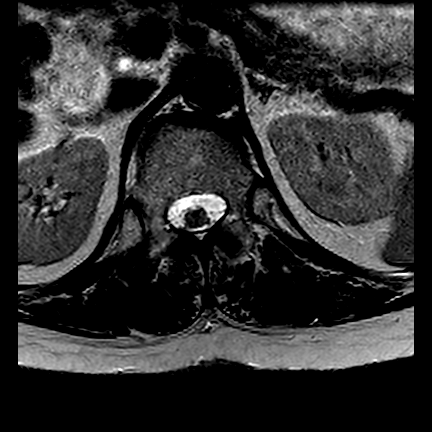
[im 15/75]
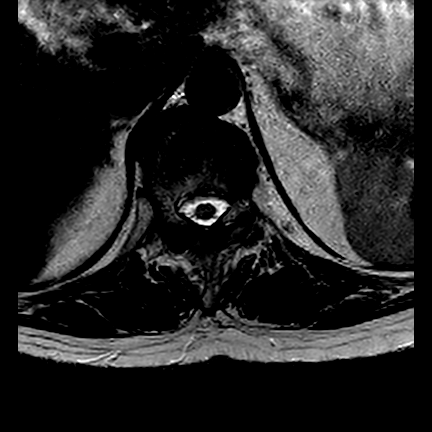
[im 23/75]
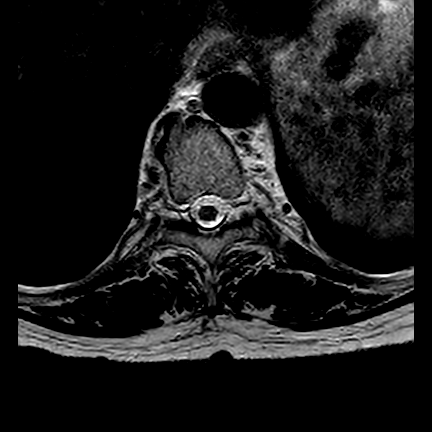
[im 30/75]
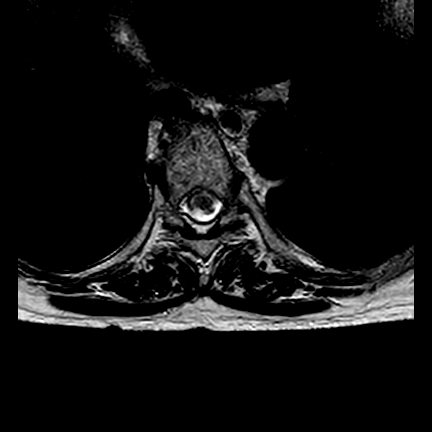
[im 38/75]
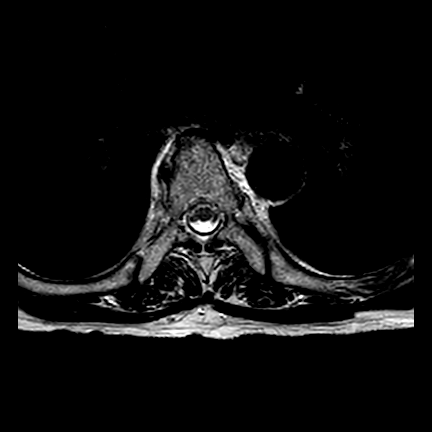
[im 45/75]
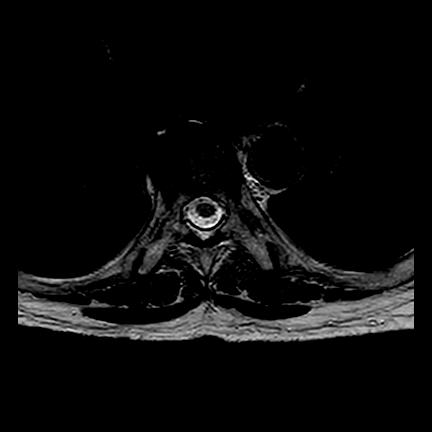
[im 52/75]
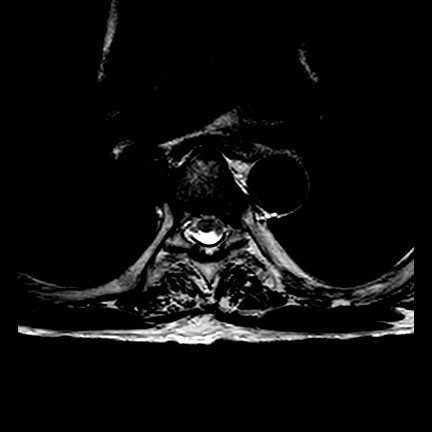
[im 60/75]
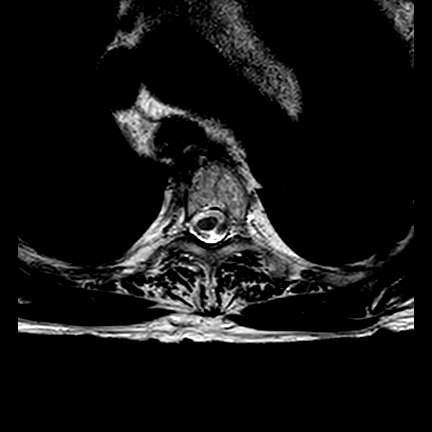
[im 67/75]
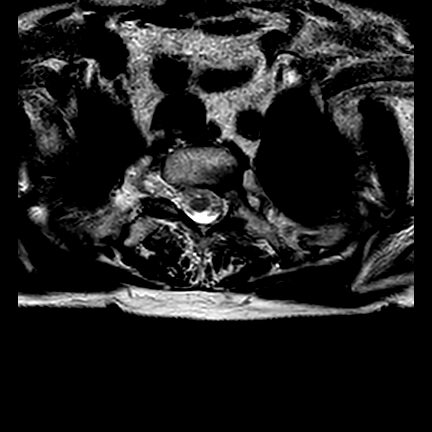

[17 of 48 positions shown; findings below may reference images not displayed]

FINDINGS: -------------------------------------------------------------------------------- 
------ 
GENERAL: 
ALIGNMENT: Cervicothoracic curvature. 
VERTEBRAL BODY HEIGHT: Normal.  
MARROW SIGNAL: Normal 
CORD SIGNAL: Normal. 
ADDITIONAL FINDINGS: There is metallic susceptibility artifact about the left 
shoulder suggests for TSA. There is metallic susceptibility artifact with 
transpedicular screw and rod fixation L4-L5 included on the counting series with 
grade 1 anterolisthesis. Scattered interbody osteophytic spurring. Few scattered 
perineural root sleeve cysts. 
-------------------------------------------------------------------------------- 
------ 
RELEVANT SEGMENTAL (levels with severe stenosis or significant findings): 
T1-T2: Normal disc height. No herniation. Normal facets. No spinal canal or 
neural foraminal stenosis. 
T2-T3: Diffuse disc desiccation disc height loss. Mild broad-based disc 
protrusion eccentric to the right with mild ventral cord deformation. Moderate 
right neural foraminal stenosis. 
T3-T4: Diffuse disc desiccation and disc height loss. Mild broad-based disc 
protrusion greater on the right with mass effect upon the ventral cord with cord 
deformation. Mild right neural foraminal stenosis. 
T4-T5: Normal disc height. No herniation. Normal facets. No spinal canal or 
neural foraminal stenosis. 
T5-T6: Normal disc height. No herniation. Normal facets. No spinal canal or 
neural foraminal stenosis. 
T6-T7: Normal disc height. No herniation. Normal facets. No spinal canal or 
neural foraminal stenosis. 
T7-T8: Normal disc height. No herniation. Normal facets. No spinal canal or 
neural foraminal stenosis. 
T8-T9: Normal disc height. No herniation. Normal facets. No spinal canal or 
neural foraminal stenosis. 
T9-T10: Normal disc height. Mild broad-based disc protrusion. No spinal canal or 
neural foraminal stenosis. 
T10-T11: Mild disc height loss. Mild broad-based disc protrusion. Mild facet 
hypertrophy. Mild bilateral neural foraminal stenoses and spinal canal stenosis. 
T11-T12: Mild broad-based disc protrusion eccentric to the right. Mild facet 
hypertrophy. Mild right neural foraminal stenosis. 
T12-L1: Mild disc desiccation and disc height loss. Mild broad-based disc 
protrusion. Mild facet hypertrophy. Mild left neural foraminal stenosis. 
Additional scattered discogenic/degenerative changes are noted. 
-------------------------------------------------------------------------------- 
------
IMPRESSION: 1.  Cervicothoracic curvature with moderately advanced spondylotic changes. 
2.  Multilevel mild stenoses. 
3.  Multilevel disc herniations with some mass effect upon the ventral cord with 
mild cord deformation at T2-T3 and T3-T4.

## 2022-11-07 IMAGING — MR MRI CERVICAL SPINE WITHOUT CONTRAST
4 of 7 series · 21 of 48 positions shown · IV contrast (gadolinium)
Comparison: Radiograph since the spine of 10/26/2022

________________________________________________________________________________________________ 
MRI CERVICAL SPINE WITHOUT CONTRAST, 11/07/2022 [DATE]: 
CLINICAL INDICATION: Patient had hip injection Abbie Mmago Loarrah with the subsequent day 
inability to walk without help. Pain across low back into left hip and right 
thigh. Ataxic gait. Evaluate for cord compression.
TECHNIQUE: Multiplanar, multiecho position MR images of the cervical spine were 
performed without intravenous gadolinium enhancement. Patient was scanned on a 
1.5T magnet.

[Series 101: survey · axial · 10.0mm · 1.25mm/px · z∈[-6,+200]mm · 3 of 10 slices shown]
[im 1/10]
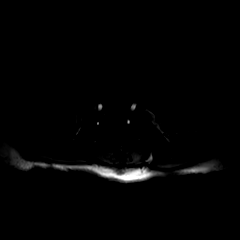
[im 7/10]
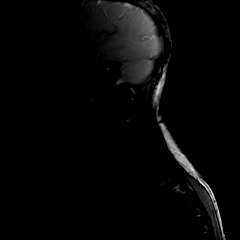
[im 10/10]
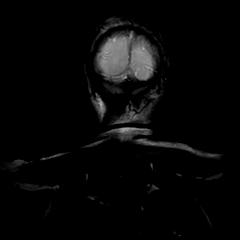

[Series 201: t2w_cor-surv · coronal · 5.0mm · 0.69mm/px · 3 of 7 slices shown]
[im 1/7]
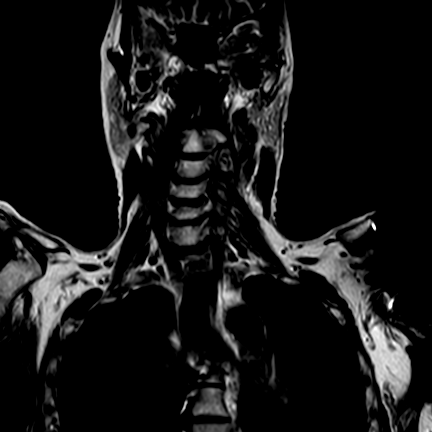
[im 4/7]
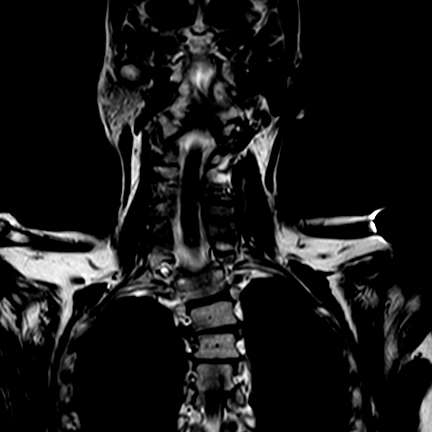
[im 7/7]
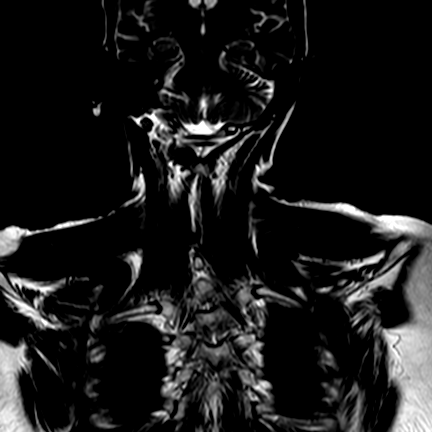

[Series 301: T1 · sagittal · 3.0mm · 0.39mm/px · 4 of 19 slices shown]
[im 1/19]
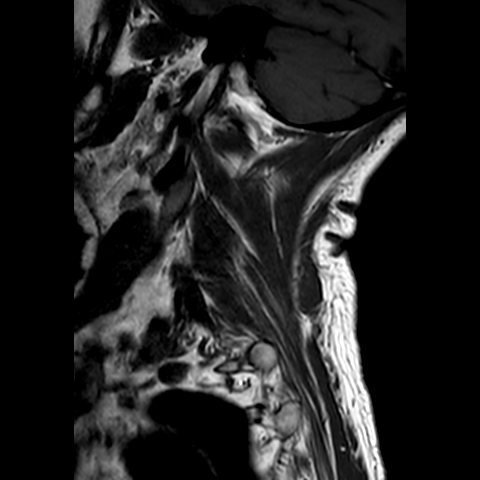
[im 4/19]
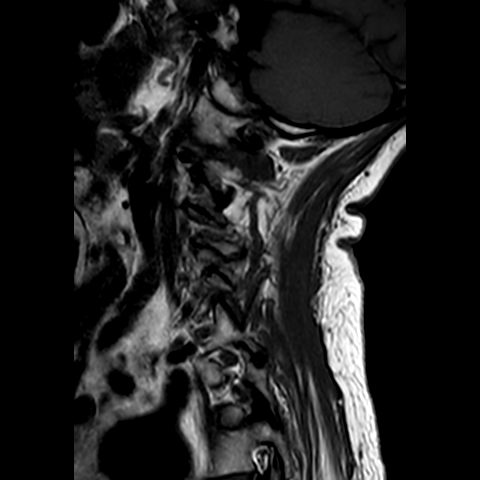
[im 10/19]
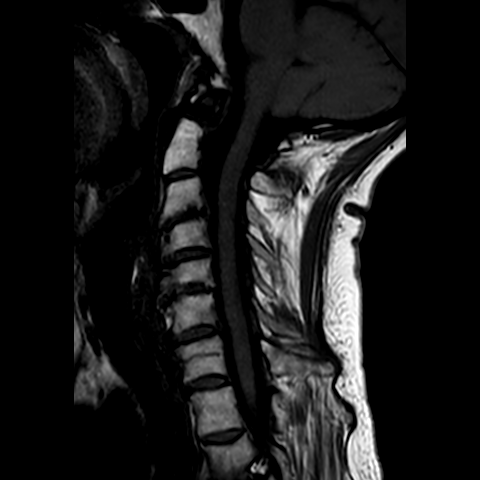
[im 16/19]
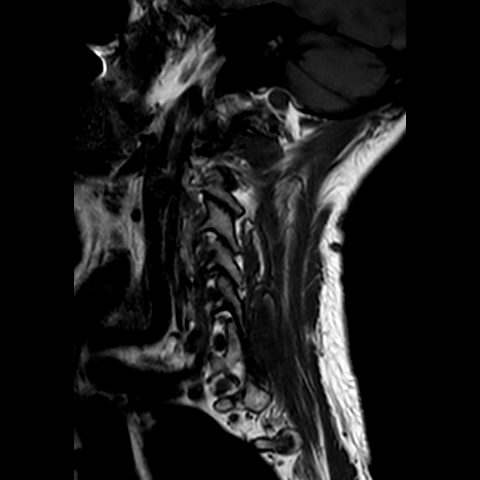

[Series 601: T2 · axial · 3.0mm · 0.31mm/px · z∈[-90,+5]mm · 11 of 34 slices shown]
[im 1/34]
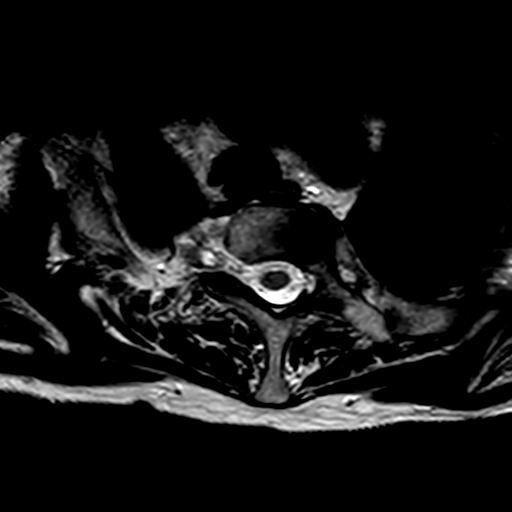
[im 4/34]
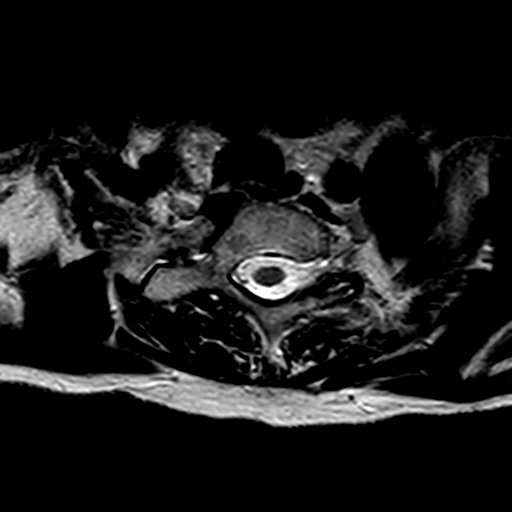
[im 7/34]
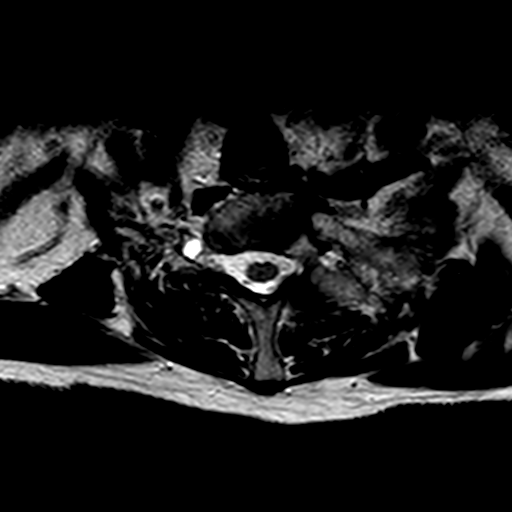
[im 10/34]
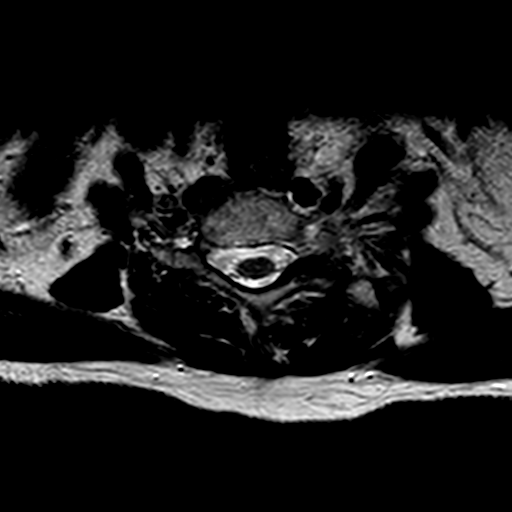
[im 14/34]
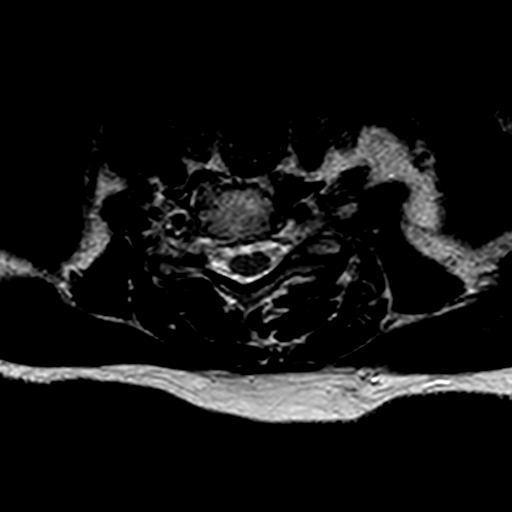
[im 17/34]
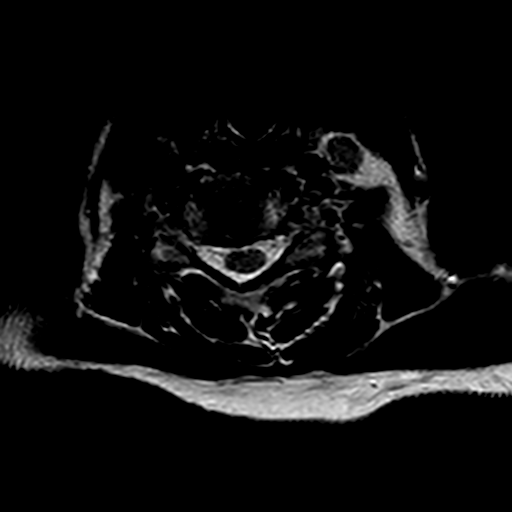
[im 20/34]
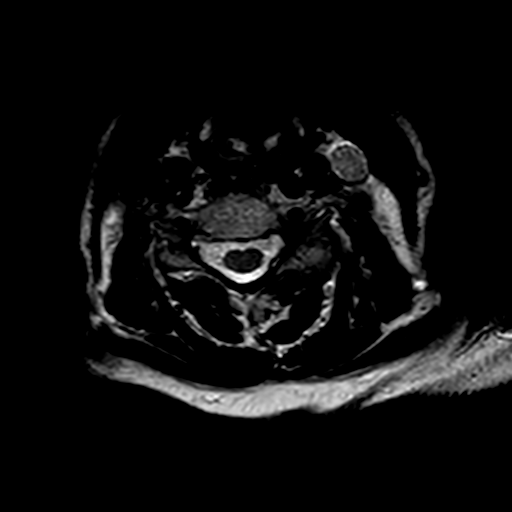
[im 24/34]
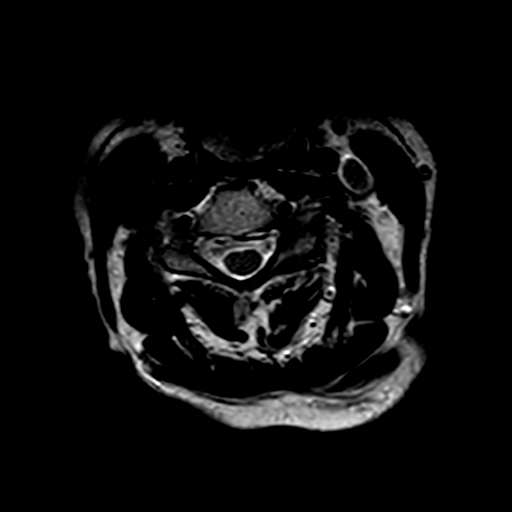
[im 27/34]
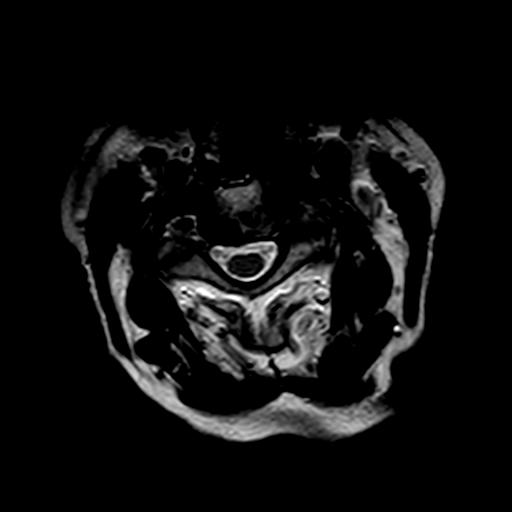
[im 30/34]
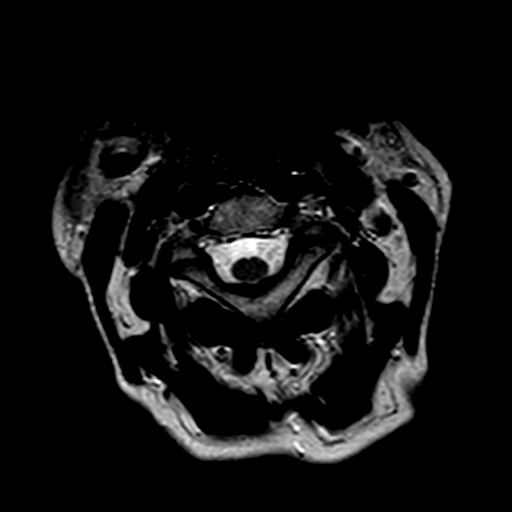
[im 34/34]
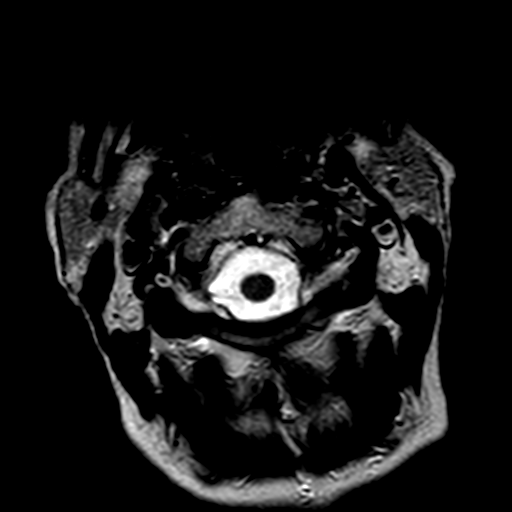

[21 of 48 positions shown; findings below may reference images not displayed]

FINDINGS: -------------------------------------------------------------------------------- 
----------------- 
GENERAL: 
There is well corticated dictated fracture deformity of the upper dens with a 
chronic type appearance. There is a fluid cleft at the level of the fracture 
deformity. The C1-C2 articulation appears preserved without subluxation of the 
proximal dens. No acute vertebral body fracture. Cervicothoracic curvature. 
Scattered interbody osteophytic spurring. Normal cord signal intensity. 
Cerebellar tonsils are well located. Few scattered perineural root sleeve cyst. 
There is metallic susceptibility artifact about the left shoulder suggests for 
PSA. 
-------------------------------------------------------------------------------- 
---------------- 
SEGMENTAL: 
CRANIOCERVICAL JUNCTION: No significant stenosis. 
C2-C3: Normal disc height. No herniation. Mild facet hypertrophy on the left. No 
spinal canal or neural foraminal stenosis. 
C3-C4: Moderate disc desiccation disc height loss. Mild dorsal disc osteophyte 
complex. There is uncovertebral and facet hypertrophy greater on the left with 
severe left neural foraminal stenosis with potential for left C4 neural 
impingement. 
C4-C5: Mild disc desiccation without disc height loss. Mild dorsal disc 
osteophyte complex. Moderate facet hypertrophy greater on the left. Moderate 
left neural foraminal stenosis. 
C5-C6: Moderate disc desiccation and disc height loss. Mild dorsal disc 
osteophyte complex. There is uncovertebral and facet hypertrophy greater on the 
left with mild to moderate left and mild right neural foraminal stenosis. 
C6-C7: Mild disc desiccation without disc height loss. Mild posterior 
paracentral disc protrusion eccentric to the right with partial effacement of 
the ventral thecal sac. No spinal canal or neural foraminal stenosis. 
C7-T1: Normal disc height. No herniation. Normal facets. No spinal canal or 
neural foraminal stenosis. 
Partially included on the sagittal series, there is a right-sided broad-based 
disc protrusion at T2-T3. 
-------------------------------------------------------------------------------- 
---------------
IMPRESSION: 1.  Chronic fracture deformity of the dens with well-corticated margination and 
fluid cleft without subluxation of the C1-C2 articulation. 
2.  Moderately advanced spondylotic changes cervical spine with multilevel 
neural foraminal stenoses, greatest on the left at C3-C4 with potential for 
neural impingement.

## 2022-12-12 NOTE — Telephone Encounter (Signed)
Pt not seen in over > 18 months  Needs appt

## 2023-04-23 NOTE — Telephone Encounter (Signed)
LVM for Pt to see if she was planning to f/u with [facility] hepatology.

## 2023-04-24 NOTE — Telephone Encounter (Signed)
Last visit:  **Patient appears to have moved to [place], OV notes have been faxed per encounters. Please review if refilling is appropriate. Noted in my last refill message 03/2022    F/u:  none scheduled    Last labs:  07/29/21    Dx:  Gastroesophageal reflux disease without esophagitis

## 2023-04-24 NOTE — Telephone Encounter (Signed)
Messaged admin to request they call pt to clarify

## 2023-05-07 NOTE — Telephone Encounter (Signed)
Patient returned missed call, Patient said they have relocated to [place] and will no longer need appointments at this time.    CB#[phone]    Thank you
